# Patient Record
Sex: Male | Born: 1945 | Race: White | Hispanic: No | Smoking: Former smoker
Health system: Southern US, Community
[De-identification: ages and names within clinical notes are randomized; demographics above are authoritative.]

## PROBLEM LIST (undated history)

## (undated) DIAGNOSIS — Z950 Presence of cardiac pacemaker: Secondary | ICD-10-CM

## (undated) DIAGNOSIS — Z87442 Personal history of urinary calculi: Secondary | ICD-10-CM

## (undated) DIAGNOSIS — R112 Nausea with vomiting, unspecified: Secondary | ICD-10-CM

## (undated) DIAGNOSIS — J329 Chronic sinusitis, unspecified: Secondary | ICD-10-CM

## (undated) DIAGNOSIS — R351 Nocturia: Secondary | ICD-10-CM

## (undated) DIAGNOSIS — R42 Dizziness and giddiness: Secondary | ICD-10-CM

## (undated) DIAGNOSIS — K449 Diaphragmatic hernia without obstruction or gangrene: Secondary | ICD-10-CM

## (undated) DIAGNOSIS — I5022 Chronic systolic (congestive) heart failure: Secondary | ICD-10-CM

## (undated) DIAGNOSIS — S06369A Traumatic hemorrhage of cerebrum, unspecified, with loss of consciousness of unspecified duration, initial encounter: Secondary | ICD-10-CM

## (undated) DIAGNOSIS — J449 Chronic obstructive pulmonary disease, unspecified: Secondary | ICD-10-CM

## (undated) DIAGNOSIS — E785 Hyperlipidemia, unspecified: Secondary | ICD-10-CM

## (undated) DIAGNOSIS — I77810 Thoracic aortic ectasia: Secondary | ICD-10-CM

## (undated) DIAGNOSIS — K219 Gastro-esophageal reflux disease without esophagitis: Secondary | ICD-10-CM

## (undated) DIAGNOSIS — I429 Cardiomyopathy, unspecified: Secondary | ICD-10-CM

## (undated) DIAGNOSIS — J324 Chronic pansinusitis: Secondary | ICD-10-CM

## (undated) DIAGNOSIS — I10 Essential (primary) hypertension: Secondary | ICD-10-CM

## (undated) DIAGNOSIS — G8929 Other chronic pain: Secondary | ICD-10-CM

## (undated) DIAGNOSIS — E669 Obesity, unspecified: Secondary | ICD-10-CM

## (undated) DIAGNOSIS — M1990 Unspecified osteoarthritis, unspecified site: Secondary | ICD-10-CM

## (undated) DIAGNOSIS — N529 Male erectile dysfunction, unspecified: Secondary | ICD-10-CM

## (undated) DIAGNOSIS — N183 Chronic kidney disease, stage 3 unspecified: Secondary | ICD-10-CM

## (undated) DIAGNOSIS — M2550 Pain in unspecified joint: Secondary | ICD-10-CM

## (undated) DIAGNOSIS — I499 Cardiac arrhythmia, unspecified: Secondary | ICD-10-CM

## (undated) DIAGNOSIS — I2510 Atherosclerotic heart disease of native coronary artery without angina pectoris: Secondary | ICD-10-CM

## (undated) DIAGNOSIS — N400 Enlarged prostate without lower urinary tract symptoms: Secondary | ICD-10-CM

## (undated) DIAGNOSIS — N481 Balanitis: Secondary | ICD-10-CM

## (undated) DIAGNOSIS — R0600 Dyspnea, unspecified: Secondary | ICD-10-CM

## (undated) DIAGNOSIS — Z9889 Other specified postprocedural states: Secondary | ICD-10-CM

## (undated) HISTORY — DX: Cardiac arrhythmia, unspecified: I49.9

## (undated) HISTORY — DX: Hyperlipidemia, unspecified: E78.5

## (undated) HISTORY — DX: Other chronic pain: G89.29

## (undated) HISTORY — DX: Essential (primary) hypertension: I10

## (undated) HISTORY — DX: Unspecified osteoarthritis, unspecified site: M19.90

## (undated) HISTORY — DX: Diaphragmatic hernia without obstruction or gangrene: K44.9

## (undated) HISTORY — DX: Gastro-esophageal reflux disease without esophagitis: K21.9

## (undated) HISTORY — DX: Pain in unspecified joint: M25.50

## (undated) HISTORY — DX: Male erectile dysfunction, unspecified: N52.9

## (undated) HISTORY — DX: Traumatic hemorrhage of cerebrum, unspecified, with loss of consciousness of unspecified duration, initial encounter: S06.369A

## (undated) HISTORY — PX: SINUS SURGERY WITH INSTATRAK: SHX5215

## (undated) HISTORY — DX: Chronic sinusitis, unspecified: J32.9

---

## 1993-05-04 DIAGNOSIS — S0689AA Other specified intracranial injury with loss of consciousness status unknown, initial encounter: Secondary | ICD-10-CM

## 1993-05-04 DIAGNOSIS — S06899A Other specified intracranial injury with loss of consciousness of unspecified duration, initial encounter: Secondary | ICD-10-CM

## 1993-05-04 HISTORY — DX: Other specified intracranial injury with loss of consciousness status unknown, initial encounter: S06.89AA

## 1993-05-04 HISTORY — PX: OTHER SURGICAL HISTORY: SHX169

## 1993-05-04 HISTORY — PX: CRANIOTOMY: SHX93

## 1993-05-04 HISTORY — DX: Other specified intracranial injury with loss of consciousness of unspecified duration, initial encounter: S06.899A

## 1997-11-22 ENCOUNTER — Encounter: Admission: RE | Admit: 1997-11-22 | Discharge: 1997-11-22 | Payer: Self-pay

## 1997-12-31 ENCOUNTER — Encounter: Admission: RE | Admit: 1997-12-31 | Discharge: 1997-12-31 | Payer: Self-pay | Admitting: Hematology and Oncology

## 1998-11-07 ENCOUNTER — Encounter: Admission: RE | Admit: 1998-11-07 | Discharge: 1998-11-07 | Payer: Self-pay | Admitting: Hematology and Oncology

## 1999-04-08 ENCOUNTER — Encounter: Admission: RE | Admit: 1999-04-08 | Discharge: 1999-04-08 | Payer: Self-pay | Admitting: Internal Medicine

## 1999-05-22 ENCOUNTER — Encounter: Admission: RE | Admit: 1999-05-22 | Discharge: 1999-05-22 | Payer: Self-pay | Admitting: Internal Medicine

## 1999-09-27 ENCOUNTER — Encounter: Payer: Self-pay | Admitting: Emergency Medicine

## 1999-09-27 ENCOUNTER — Emergency Department (HOSPITAL_COMMUNITY): Admission: EM | Admit: 1999-09-27 | Discharge: 1999-09-27 | Payer: Self-pay | Admitting: Emergency Medicine

## 1999-10-16 ENCOUNTER — Encounter: Admission: RE | Admit: 1999-10-16 | Discharge: 1999-10-16 | Payer: Self-pay | Admitting: Internal Medicine

## 2000-09-30 ENCOUNTER — Encounter: Admission: RE | Admit: 2000-09-30 | Discharge: 2000-09-30 | Payer: Self-pay | Admitting: Internal Medicine

## 2001-06-15 ENCOUNTER — Encounter: Admission: RE | Admit: 2001-06-15 | Discharge: 2001-06-15 | Payer: Self-pay

## 2002-04-10 ENCOUNTER — Encounter: Admission: RE | Admit: 2002-04-10 | Discharge: 2002-04-10 | Payer: Self-pay | Admitting: Internal Medicine

## 2002-07-14 ENCOUNTER — Encounter: Admission: RE | Admit: 2002-07-14 | Discharge: 2002-07-14 | Payer: Self-pay | Admitting: Internal Medicine

## 2002-07-24 ENCOUNTER — Encounter: Admission: RE | Admit: 2002-07-24 | Discharge: 2002-07-24 | Payer: Self-pay | Admitting: Internal Medicine

## 2002-07-28 ENCOUNTER — Encounter: Payer: Self-pay | Admitting: Internal Medicine

## 2002-07-28 ENCOUNTER — Ambulatory Visit (HOSPITAL_COMMUNITY): Admission: RE | Admit: 2002-07-28 | Discharge: 2002-07-28 | Payer: Self-pay | Admitting: Internal Medicine

## 2002-07-31 ENCOUNTER — Encounter: Admission: RE | Admit: 2002-07-31 | Discharge: 2002-07-31 | Payer: Self-pay | Admitting: Internal Medicine

## 2002-09-01 ENCOUNTER — Encounter: Payer: Self-pay | Admitting: Otolaryngology

## 2002-09-01 ENCOUNTER — Ambulatory Visit (HOSPITAL_COMMUNITY): Admission: RE | Admit: 2002-09-01 | Discharge: 2002-09-01 | Payer: Self-pay | Admitting: Otolaryngology

## 2002-09-08 ENCOUNTER — Encounter (INDEPENDENT_AMBULATORY_CARE_PROVIDER_SITE_OTHER): Payer: Self-pay | Admitting: Specialist

## 2002-09-08 ENCOUNTER — Ambulatory Visit (HOSPITAL_BASED_OUTPATIENT_CLINIC_OR_DEPARTMENT_OTHER): Admission: RE | Admit: 2002-09-08 | Discharge: 2002-09-08 | Payer: Self-pay | Admitting: Otolaryngology

## 2002-11-09 ENCOUNTER — Encounter: Admission: RE | Admit: 2002-11-09 | Discharge: 2002-11-09 | Payer: Self-pay | Admitting: Internal Medicine

## 2002-11-16 ENCOUNTER — Encounter: Admission: RE | Admit: 2002-11-16 | Discharge: 2002-11-16 | Payer: Self-pay | Admitting: Internal Medicine

## 2002-11-29 ENCOUNTER — Encounter: Admission: RE | Admit: 2002-11-29 | Discharge: 2002-11-29 | Payer: Self-pay | Admitting: Internal Medicine

## 2002-12-19 ENCOUNTER — Encounter: Admission: RE | Admit: 2002-12-19 | Discharge: 2002-12-19 | Payer: Self-pay | Admitting: Internal Medicine

## 2003-01-01 ENCOUNTER — Encounter: Admission: RE | Admit: 2003-01-01 | Discharge: 2003-01-01 | Payer: Self-pay | Admitting: Internal Medicine

## 2003-02-19 ENCOUNTER — Encounter: Admission: RE | Admit: 2003-02-19 | Discharge: 2003-02-19 | Payer: Self-pay | Admitting: Internal Medicine

## 2003-06-25 ENCOUNTER — Encounter: Admission: RE | Admit: 2003-06-25 | Discharge: 2003-06-25 | Payer: Self-pay | Admitting: Internal Medicine

## 2003-12-04 ENCOUNTER — Encounter: Admission: RE | Admit: 2003-12-04 | Discharge: 2003-12-04 | Payer: Self-pay | Admitting: Internal Medicine

## 2003-12-17 ENCOUNTER — Encounter: Admission: RE | Admit: 2003-12-17 | Discharge: 2003-12-17 | Payer: Self-pay | Admitting: Internal Medicine

## 2003-12-31 ENCOUNTER — Encounter: Admission: RE | Admit: 2003-12-31 | Discharge: 2003-12-31 | Payer: Self-pay | Admitting: Internal Medicine

## 2004-05-16 ENCOUNTER — Emergency Department (HOSPITAL_COMMUNITY): Admission: EM | Admit: 2004-05-16 | Discharge: 2004-05-16 | Payer: Self-pay | Admitting: Family Medicine

## 2004-08-13 ENCOUNTER — Ambulatory Visit: Payer: Self-pay | Admitting: Internal Medicine

## 2004-08-27 ENCOUNTER — Ambulatory Visit: Payer: Self-pay | Admitting: Internal Medicine

## 2005-05-14 ENCOUNTER — Ambulatory Visit: Payer: Self-pay | Admitting: Internal Medicine

## 2005-05-15 ENCOUNTER — Ambulatory Visit: Payer: Self-pay | Admitting: Hospitalist

## 2005-05-22 ENCOUNTER — Ambulatory Visit: Payer: Self-pay | Admitting: Internal Medicine

## 2005-05-22 ENCOUNTER — Encounter (INDEPENDENT_AMBULATORY_CARE_PROVIDER_SITE_OTHER): Payer: Self-pay | Admitting: *Deleted

## 2005-05-22 LAB — CONVERTED CEMR LAB
Cholesterol: 203 mg/dL
Creatinine, Ser: 1 mg/dL
Glucose, Bld: 99 mg/dL
HDL: 45 mg/dL
LDL Cholesterol: 142 mg/dL
Triglyceride fasting, serum: 80 mg/dL

## 2005-06-10 ENCOUNTER — Ambulatory Visit: Payer: Self-pay | Admitting: Gastroenterology

## 2005-06-17 ENCOUNTER — Ambulatory Visit: Payer: Self-pay | Admitting: Gastroenterology

## 2005-06-17 ENCOUNTER — Encounter (INDEPENDENT_AMBULATORY_CARE_PROVIDER_SITE_OTHER): Payer: Self-pay | Admitting: *Deleted

## 2005-06-17 LAB — HM COLONOSCOPY: HM Colonoscopy: ABNORMAL

## 2006-02-12 ENCOUNTER — Ambulatory Visit: Payer: Self-pay | Admitting: Internal Medicine

## 2006-03-15 ENCOUNTER — Encounter: Payer: Self-pay | Admitting: *Deleted

## 2006-03-15 DIAGNOSIS — K449 Diaphragmatic hernia without obstruction or gangrene: Secondary | ICD-10-CM | POA: Insufficient documentation

## 2006-03-15 DIAGNOSIS — S069X9A Unspecified intracranial injury with loss of consciousness of unspecified duration, initial encounter: Secondary | ICD-10-CM

## 2006-03-15 DIAGNOSIS — E785 Hyperlipidemia, unspecified: Secondary | ICD-10-CM

## 2006-03-15 DIAGNOSIS — M1990 Unspecified osteoarthritis, unspecified site: Secondary | ICD-10-CM | POA: Insufficient documentation

## 2006-03-15 DIAGNOSIS — I499 Cardiac arrhythmia, unspecified: Secondary | ICD-10-CM | POA: Insufficient documentation

## 2006-03-15 DIAGNOSIS — I10 Essential (primary) hypertension: Secondary | ICD-10-CM | POA: Insufficient documentation

## 2006-03-15 DIAGNOSIS — K219 Gastro-esophageal reflux disease without esophagitis: Secondary | ICD-10-CM | POA: Insufficient documentation

## 2006-03-15 DIAGNOSIS — J329 Chronic sinusitis, unspecified: Secondary | ICD-10-CM | POA: Insufficient documentation

## 2006-04-16 ENCOUNTER — Ambulatory Visit: Payer: Self-pay | Admitting: Internal Medicine

## 2006-04-22 DIAGNOSIS — N4889 Other specified disorders of penis: Secondary | ICD-10-CM | POA: Insufficient documentation

## 2006-04-22 DIAGNOSIS — M109 Gout, unspecified: Secondary | ICD-10-CM

## 2006-04-22 DIAGNOSIS — F528 Other sexual dysfunction not due to a substance or known physiological condition: Secondary | ICD-10-CM

## 2006-05-22 ENCOUNTER — Emergency Department (HOSPITAL_COMMUNITY): Admission: EM | Admit: 2006-05-22 | Discharge: 2006-05-22 | Payer: Self-pay | Admitting: Emergency Medicine

## 2006-07-14 ENCOUNTER — Encounter (INDEPENDENT_AMBULATORY_CARE_PROVIDER_SITE_OTHER): Payer: Self-pay | Admitting: *Deleted

## 2006-07-14 ENCOUNTER — Ambulatory Visit: Payer: Self-pay | Admitting: *Deleted

## 2006-07-14 LAB — CONVERTED CEMR LAB
ALT: 27 units/L (ref 0–53)
AST: 20 units/L (ref 0–37)
Albumin: 4.5 g/dL (ref 3.5–5.2)
Alkaline Phosphatase: 56 units/L (ref 39–117)
BUN: 21 mg/dL (ref 6–23)
CO2: 26 meq/L (ref 19–32)
Calcium: 9.4 mg/dL (ref 8.4–10.5)
Chloride: 106 meq/L (ref 96–112)
Cholesterol: 152 mg/dL (ref 0–200)
Creatinine, Ser: 0.85 mg/dL (ref 0.40–1.50)
Glucose, Bld: 97 mg/dL (ref 70–99)
HDL: 47 mg/dL (ref >39)
LDL Cholesterol: 92 mg/dL (ref 0–99)
Phenobarbital: 19.6 ug/mL (ref 15.0–40.0)
Potassium: 4.6 meq/L (ref 3.5–5.3)
Sodium: 141 meq/L (ref 135–145)
Total Bilirubin: 0.5 mg/dL (ref 0.3–1.2)
Total CHOL/HDL Ratio: 3.2
Total Protein: 7.5 g/dL (ref 6.0–8.3)
Triglycerides: 64 mg/dL (ref <150)
VLDL: 13 mg/dL (ref 0–40)

## 2007-01-07 ENCOUNTER — Telehealth: Payer: Self-pay | Admitting: *Deleted

## 2007-02-28 ENCOUNTER — Telehealth: Payer: Self-pay | Admitting: *Deleted

## 2007-03-14 ENCOUNTER — Telehealth: Payer: Self-pay | Admitting: *Deleted

## 2007-03-28 ENCOUNTER — Ambulatory Visit: Payer: Self-pay | Admitting: Internal Medicine

## 2007-03-28 DIAGNOSIS — R21 Rash and other nonspecific skin eruption: Secondary | ICD-10-CM | POA: Insufficient documentation

## 2007-06-19 ENCOUNTER — Emergency Department (HOSPITAL_COMMUNITY): Admission: EM | Admit: 2007-06-19 | Discharge: 2007-06-19 | Payer: Self-pay | Admitting: Family Medicine

## 2007-09-29 ENCOUNTER — Telehealth (INDEPENDENT_AMBULATORY_CARE_PROVIDER_SITE_OTHER): Payer: Self-pay | Admitting: *Deleted

## 2007-10-10 ENCOUNTER — Telehealth (INDEPENDENT_AMBULATORY_CARE_PROVIDER_SITE_OTHER): Payer: Self-pay | Admitting: *Deleted

## 2008-01-10 ENCOUNTER — Ambulatory Visit: Payer: Self-pay | Admitting: Internal Medicine

## 2008-01-10 ENCOUNTER — Encounter (INDEPENDENT_AMBULATORY_CARE_PROVIDER_SITE_OTHER): Payer: Self-pay | Admitting: *Deleted

## 2008-01-10 DIAGNOSIS — N400 Enlarged prostate without lower urinary tract symptoms: Secondary | ICD-10-CM | POA: Insufficient documentation

## 2008-01-10 LAB — CONVERTED CEMR LAB
CO2: 28 meq/L (ref 19–32)
Calcium: 9.3 mg/dL (ref 8.4–10.5)
Chloride: 103 meq/L (ref 96–112)
HDL: 45 mg/dL (ref >39)
Hemoglobin, Urine: NEGATIVE
Ketones, ur: NEGATIVE mg/dL
LDL Cholesterol: 82 mg/dL (ref 0–99)
Nitrite: NEGATIVE
PSA: 0.54 ng/mL (ref 0.10–4.00)
Platelets: 129 10*3/uL — ABNORMAL LOW (ref 150–400)
Potassium: 4.6 meq/L (ref 3.5–5.3)
Sodium: 140 meq/L (ref 135–145)
Total CHOL/HDL Ratio: 3.2
WBC: 5.3 10*3/uL (ref 4.0–10.5)
pH: 7 (ref 5.0–8.0)

## 2008-02-06 ENCOUNTER — Ambulatory Visit: Payer: Self-pay | Admitting: *Deleted

## 2008-02-06 ENCOUNTER — Observation Stay (HOSPITAL_COMMUNITY): Admission: EM | Admit: 2008-02-06 | Discharge: 2008-02-07 | Payer: Self-pay | Admitting: Emergency Medicine

## 2008-02-07 ENCOUNTER — Encounter: Payer: Self-pay | Admitting: Internal Medicine

## 2008-07-25 ENCOUNTER — Telehealth (INDEPENDENT_AMBULATORY_CARE_PROVIDER_SITE_OTHER): Payer: Self-pay | Admitting: *Deleted

## 2008-08-21 ENCOUNTER — Encounter (INDEPENDENT_AMBULATORY_CARE_PROVIDER_SITE_OTHER): Payer: Self-pay | Admitting: Internal Medicine

## 2008-08-24 ENCOUNTER — Telehealth (INDEPENDENT_AMBULATORY_CARE_PROVIDER_SITE_OTHER): Payer: Self-pay | Admitting: Internal Medicine

## 2008-10-08 ENCOUNTER — Telehealth: Payer: Self-pay | Admitting: *Deleted

## 2008-11-19 ENCOUNTER — Telehealth: Payer: Self-pay | Admitting: Internal Medicine

## 2009-01-03 ENCOUNTER — Ambulatory Visit: Payer: Self-pay | Admitting: Internal Medicine

## 2009-01-03 LAB — CONVERTED CEMR LAB
AST: 19 units/L (ref 0–37)
BUN: 12 mg/dL (ref 6–23)
CO2: 26 meq/L (ref 19–32)
Calcium: 9.7 mg/dL (ref 8.4–10.5)
Chloride: 103 meq/L (ref 96–112)
Creatinine, Ser: 0.96 mg/dL (ref 0.40–1.50)

## 2009-06-17 ENCOUNTER — Telehealth: Payer: Self-pay | Admitting: Internal Medicine

## 2009-07-15 ENCOUNTER — Telehealth: Payer: Self-pay | Admitting: Internal Medicine

## 2009-07-17 ENCOUNTER — Telehealth: Payer: Self-pay | Admitting: *Deleted

## 2009-08-19 ENCOUNTER — Telehealth: Payer: Self-pay | Admitting: Internal Medicine

## 2009-09-20 ENCOUNTER — Telehealth: Payer: Self-pay | Admitting: Internal Medicine

## 2010-01-21 ENCOUNTER — Telehealth: Payer: Self-pay | Admitting: Internal Medicine

## 2010-01-23 ENCOUNTER — Telehealth: Payer: Self-pay | Admitting: Internal Medicine

## 2010-02-20 ENCOUNTER — Telehealth: Payer: Self-pay | Admitting: Internal Medicine

## 2010-03-10 ENCOUNTER — Ambulatory Visit: Payer: Self-pay | Admitting: Internal Medicine

## 2010-03-17 ENCOUNTER — Ambulatory Visit: Payer: Self-pay | Admitting: Internal Medicine

## 2010-03-17 LAB — CONVERTED CEMR LAB
ALT: 34 units/L (ref 0–53)
Albumin: 4.4 g/dL (ref 3.5–5.2)
CO2: 32 meq/L (ref 19–32)
Calcium: 9.3 mg/dL (ref 8.4–10.5)
Chloride: 104 meq/L (ref 96–112)
Cholesterol: 146 mg/dL (ref 0–200)
Potassium: 4.7 meq/L (ref 3.5–5.3)
Sodium: 145 meq/L (ref 135–145)
Total Protein: 7.3 g/dL (ref 6.0–8.3)

## 2010-03-21 ENCOUNTER — Telehealth: Payer: Self-pay | Admitting: Internal Medicine

## 2010-06-05 NOTE — Progress Notes (Signed)
Summary: med change/gp  Phone Note Refill Request Message from:  Fax from Pharmacy on June 17, 2009 10:49 AM  Refills Requested: Medication #1:  COLCHICINE 0.6 MG TABS Take 1 tablet by mouth two times a day This med. is no longer available; can  you substitute for something else?   Method Requested: Electronic Initial call taken by: Morrison Old RN,  June 17, 2009 10:50 AM  Follow-up for Phone Call        Rx written for colcrys to replace colchicine. Follow-up by: Geanie Kenning MD,  June 17, 2009 9:20 PM    New/Updated Medications: COLCRYS 0.6 MG TABS (COLCHICINE) Take 1 tablet by mouth two times a day Prescriptions: COLCRYS 0.6 MG TABS (COLCHICINE) Take 1 tablet by mouth two times a day  #62 x 6   Entered and Authorized by:   Geanie Kenning MD   Signed by:   Geanie Kenning MD on 06/17/2009   Method used:   Electronically to        [facility] (retail)       [address]       [place], [place]  [address]       Ph: [phone]       Fax: [phone]   RxID:   [id number]

## 2010-06-05 NOTE — Progress Notes (Signed)
Summary: refill/gg  Phone Note Refill Request  on July 15, 2009 2:28 PM  Refills Requested: Medication #1:  PHENOBARBITAL 64.8 MG TABS Take 1 tablet by mouth two times a day   Dosage confirmed as above?Dosage Confirmed  Method Requested: Electronic Initial call taken by: Gevena Cotton RN,  July 15, 2009 2:28 PM    Prescriptions: PHENOBARBITAL 64.8 MG TABS (PHENOBARBITAL) Take 1 tablet by mouth two times a day  #120 x 3   Entered and Authorized by:   Geanie Kenning MD   Signed by:   Geanie Kenning MD on 07/16/2009   Method used:   Telephoned to ...       [facility] ([facility]ail)       [address]       [place], [place]  [address]       Ph: [phone]       Fax: [phone]   RxID:   [id number]   Appended Document: refill/gg Rx called in

## 2010-06-05 NOTE — Progress Notes (Signed)
Summary: Refill/gh  Phone Note Refill Request Message from:  Fax from Pharmacy on March 21, 2010 5:03 PM  Refills Requested: Medication #1:  HYDRALAZINE HCL 25 MG  TABS Take 2 tabs by mouth two times a day   Dosage confirmed as above?Dosage Confirmed   Last Refilled: 02/19/2010  Medication #2:  PRAVASTATIN SODIUM 40 MG TABS Take 1 tablet by mouth once a day   Dosage confirmed as above?Dosage Confirmed   Last Refilled: 02/19/2010  Method Requested: Electronic Initial call taken by: Sander Nephew RN,  March 21, 2010 5:04 PM  Follow-up for Phone Call        Rx completed in Dr. Brantley Stage Follow-up by: Geanie Kenning MD,  March 21, 2010 8:55 PM    Prescriptions: PRAVASTATIN SODIUM 40 MG TABS (PRAVASTATIN SODIUM) Take 1 tablet by mouth once a day  #30 x 6   Entered and Authorized by:   Geanie Kenning MD   Signed by:   Geanie Kenning MD on 03/21/2010   Method used:   Electronically to        [facility] (retail)       [address]       [place], [place]  [address]       Ph: [phone]       Fax: [phone]   RxID:   [id number] HYDRALAZINE HCL 25 MG  TABS (HYDRALAZINE HCL) Take 2 tabs by mouth two times a day  #120 x 6   Entered and Authorized by:   Geanie Kenning MD   Signed by:   Geanie Kenning MD on 03/21/2010   Method used:   Electronically to        [facility] (retail)       [address]       [place], [place]  [address]       Ph: [phone]       Fax: [phone]   RxID:   [id number]

## 2010-06-05 NOTE — Progress Notes (Signed)
Summary: check refill/ hla  Phone Note Call from Patient   Summary of Call: pt called to check if med sent to pharm, yes, goodbye Initial call taken by: Freddy Finner RN,  July 17, 2009 2:34 PM

## 2010-06-05 NOTE — Progress Notes (Signed)
Summary: med refill/gp  Phone Note Refill Request Message from:  Fax from Pharmacy on August 19, 2009 12:27 PM  Refills Requested: Medication #1:  HYDRALAZINE HCL 25 MG  TABS Take 2 tabs by mouth two times a day   Dosage confirmed as above?Dosage Confirmed   Last Refilled: 07/14/2009  Medication #2:  PRAVASTATIN SODIUM 40 MG TABS Take 1 tablet by mouth once a day   Dosage confirmed as above?Dosage Confirmed   Last Refilled: 07/14/2009  Method Requested: Electronic Initial call taken by: Morrison Old RN,  August 19, 2009 12:27 PM    Prescriptions: PRAVASTATIN SODIUM 40 MG TABS (PRAVASTATIN SODIUM) Take 1 tablet by mouth once a day  #30 x 6   Entered and Authorized by:   Geanie Kenning MD   Signed by:   Geanie Kenning MD on 08/19/2009   Method used:   Electronically to        [facility] (retail)       [address]       [place], [place]  [address]       Ph: [phone]       Fax: [phone]   RxID:   [id number] HYDRALAZINE HCL 25 MG  TABS (HYDRALAZINE HCL) Take 2 tabs by mouth two times a day  #120 x 6   Entered and Authorized by:   Geanie Kenning MD   Signed by:   Geanie Kenning MD on 08/19/2009   Method used:   Electronically to        [facility] (retail)       [address]       [place],   [address]       Ph: [phone]       Fax: [phone]   RxID:   [id number]

## 2010-06-05 NOTE — Progress Notes (Signed)
Summary: Refill/gh  Phone Note Refill Request Message from:  Fax from Pharmacy on February 20, 2010 11:46 AM  Refills Requested: Medication #1:  ENALAPRIL MALEATE 20 MG TABS Take 2 tablets by mouth once daily   Dosage confirmed as above?Dosage Confirmed   Last Refilled: 12/23/2009  Method Requested: Electronic Initial call taken by: Sander Nephew RN,  February 20, 2010 11:46 AM  Follow-up for Phone Call        Rx completed in Dr. Brantley Stage Follow-up by: Geanie Kenning MD,  February 20, 2010 7:04 PM    Prescriptions: ENALAPRIL MALEATE 20 MG TABS (ENALAPRIL MALEATE) Take 2 tablets by mouth once daily  #60 x 11   Entered and Authorized by:   Geanie Kenning MD   Signed by:   Geanie Kenning MD on 02/20/2010   Method used:   Electronically to        [facility] (retail)       [address]       [place],   [address]       Ph: [phone]       Fax: [phone]   RxID:   [id number]

## 2010-06-05 NOTE — Progress Notes (Signed)
Summary: refill/gg  **  Phone Note Refill Request  on January 23, 2010 1:04 PM  Refills Requested: Medication #1:  PHENOBARBITAL 64.8 MG TABS Take 1 tablet by mouth two times a day   Dosage confirmed as above?Dosage Confirmed Pt is out of med   Method Requested: Electronic Initial call taken by: Gevena Cotton RN,  January 23, 2010 1:04 PM  Follow-up for Phone Call        Rx completed in Dr. Brantley Stage Follow-up by: Geanie Kenning MD,  January 23, 2010 2:20 PM    Prescriptions: PHENOBARBITAL 64.8 MG TABS (PHENOBARBITAL) Take 1 tablet by mouth two times a day  #120 x 3   Entered by:   Geanie Kenning MD   Authorized by:   [facility]   Signed by:   Geanie Kenning MD on 01/23/2010   Method used:   Telephoned to ...       [facility] ([facility]       [address]       [place], [place]  [address]       Ph: [phone]       Fax: [phone]   RxID:   [id number]

## 2010-06-05 NOTE — Progress Notes (Signed)
Summary: med refill/gp  Phone Note Refill Request Message from:  Fax from Pharmacy on Sep 20, 2009 11:39 AM  Refills Requested: Medication #1:  PRILOSEC 20 MG CPDR Take 1 capsule by mouth once a day   Dosage confirmed as above?Dosage Confirmed   Last Refilled: 08/17/2009  Medication #2:  HYDROCHLOROTHIAZIDE 25 MG TABS Take 1 tablet by mouth once a day   Dosage confirmed as above?Dosage Confirmed   Last Refilled: 08/17/2009  Medication #3:  CLONIDINE HCL 0.3 MG TABS Take 1 tablet by mouth two times a day   Dosage confirmed as above?Dosage Confirmed   Last Refilled: 08/17/2009  Method Requested: Electronic Initial call taken by: Morrison Old RN,  Sep 20, 2009 11:39 AM  Follow-up for Phone Call        Rx completed in Dr. Brantley Stage Follow-up by: Geanie Kenning MD,  Sep 21, 2009 12:11 AM    Prescriptions: PRILOSEC 20 MG CPDR (OMEPRAZOLE) Take 1 capsule by mouth once a day  #30 x 6   Entered and Authorized by:   Geanie Kenning MD   Signed by:   Geanie Kenning MD on 09/21/2009   Method used:   Electronically to        [facility] (retail)       [address]       [place], [place]  [address]       Ph: [phone]       Fax: [phone]   RxID:   [id number] HYDROCHLOROTHIAZIDE 25 MG TABS (HYDROCHLOROTHIAZIDE) Take 1 tablet by mouth once a day  #30 x 6   Entered and Authorized by:   Geanie Kenning MD   Signed by:   Geanie Kenning MD on 09/21/2009   Method used:   Electronically to        [facility] (retail)       [address]       [place], [place]  [address]       Ph: [phone]       Fax: [phone]   RxID:   [id number] CLONIDINE HCL 0.3 MG TABS (CLONIDINE HCL) Take 1 tablet by mouth two times a day  #60 x 6   Entered and Authorized by:   Geanie Kenning MD   Signed by:   Geanie Kenning MD on 09/21/2009   Method used:   Electronically to        [facility] (retail)       [address]       [place], [place]  [address]       Ph: [phone]       Fax: [phone]   RxID:   [id number]

## 2010-06-05 NOTE — Progress Notes (Signed)
Summary: med. refill/gp  Phone Note Refill Request Message from:  Fax from Pharmacy on January 21, 2010 3:52 PM  Refills Requested: Medication #1:  MELOXICAM 7.5 MG TABS Take 1 tablet by mouth once a day   Dosage confirmed as above?Dosage Confirmed   Last Refilled: 12/20/2009  Medication #2:  COLCRYS 0.6 MG TABS Take 1 tablet by mouth two times a day.   Dosage confirmed as above?Dosage Confirmed   Last Refilled: 12/26/2009 Last appt. 01/03/09.  An appt.scheduled Nov 3 w/Dr Ronny Flurry.   Method Requested: Electronic Initial call taken by: Morrison Old RN,  January 21, 2010 3:51 PM  Follow-up for Phone Call        Rx completed in Dr. Brantley Stage Follow-up by: Geanie Kenning MD,  January 22, 2010 3:52 PM    Prescriptions: COLCRYS 0.6 MG TABS (COLCHICINE) Take 1 tablet by mouth two times a day  #62 x 6   Entered and Authorized by:   Geanie Kenning MD   Signed by:   Geanie Kenning MD on 01/22/2010   Method used:   Electronically to        [facility] (retail)       [address]       [place], [place]  [address]       Ph: [phone]       Fax: [phone]   RxID:   [id number] MELOXICAM 7.5 MG TABS (MELOXICAM) Take 1 tablet by mouth once a day  #30 x 6   Entered and Authorized by:   Geanie Kenning MD   Signed by:   Geanie Kenning MD on 01/22/2010   Method used:   Electronically to        [facility] (retail)       [address]       [place], [place]  [address]       Ph: [phone]       Fax: [phone]   RxID:   [id number]

## 2010-06-05 NOTE — Assessment & Plan Note (Signed)
Summary: est-ck/fu/meds/cfb   Vital Signs:  Patient profile:   65 year old male Height:      66 inches (167.64 cm) Weight:      212.8 pounds (96.73 kg) BMI:     34.47 Temp:     98.2 degrees F Pulse rate:   77 / minute BP sitting:   139 / 87  (left arm) Cuff size:   large  Vitals Entered By: Yvonna Alanis RN (March 10, 2010 4:31 PM) CC: check up - needs new Rx - all - wants flu shot Is Patient Diabetic? No Pain Assessment Patient in pain? yes     Location: all joints Intensity: 3 Type: aching Onset of pain  chronic - started after surgery in 1995 Nutritional Status BMI of > 30 = obese  Does patient need assistance? Functional Status Self care Ambulation Normal   Primary Care Provider:  Geanie Kenning MD  CC:  check up - needs new Rx - all - wants flu shot.  History of Present Illness: Pt is a 65 yo man with PMH of HTN, , OA and HLD and gout who came here for check his BP and refills.  He has been taking all his meds as instructed. No c/o including CP, SOB, muscle pain, fever, HA or seizure. No melena, hematochezia, dysuria. He still has multiple joint pain including his knees, fingers, and ankles, and mobic and tylenol helped some, but still not well controlled, worse in the morning, better after about 30 minutes of movement. He needs to refill all his meds. Denies smoking, ETOH or drug abuse.    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 Take 1 Tablet By Mouth Once A Day 2)  Clonidine Hcl 0.3 Mg Tabs (Clonidine Hcl) .... Take 1 Tablet By Mouth Two Times A Day 3)  Prilosec 20 Mg Cpdr (Omeprazole) .... Take 1 Capsule By Mouth Once A Day 4)  Phenobarbital 64.8 Mg Tabs (Phenobarbital) .... Take 1 Tablet By Mouth Two Times A Day 5)  Enalapril Maleate 20 Mg Tabs (Enalapril Maleate) .... Take 2 Tablets By Mouth Once Daily 6)  Hydralazine Hcl 25 Mg  Tabs (Hydralazine Hcl) .... Take 2 Tabs By Mouth Two Times A Day 7)  Tylenol Arthritis Pain  Tbcr (Acetaminophen Tbcr) .... Dose/frequency Not in Chart 8)  Meloxicam 7.5 Mg Tabs (Meloxicam) .... Take 1 Tablet By Mouth Once A Day 9)  Viagra 100 Mg Tabs (Sildenafil Citrate) .... Take 1/2 Tablet By Mouth As Needed. 10)  Pravastatin Sodium 40 Mg Tabs (Pravastatin Sodium) .... Take 1 Tablet By Mouth Once A Day 11)  Mucinex Dm 30-600 Mg Xr12h-Tab (Dextromethorphan-Guaifenesin) .... Take 1 Tablet By Mouth Once A Day As Needed For Cough and Congestion 12)  Colcrys 0.6 Mg Tabs (Colchicine) .Marland KitchenMarland KitchenMarland Kitchen  Take 1 Tablet By Mouth Two Times A Day  Current Medications (verified): 1)  Hydrochlorothiazide 25 Mg Tabs (Hydrochlorothiazide) .... Take 1 Tablet By Mouth Once A Day 2)  Clonidine Hcl 0.3 Mg Tabs (Clonidine Hcl) .... Take 1 Tablet By Mouth Two Times A Day 3)  Prilosec 20 Mg Cpdr (Omeprazole) .... Take 1 Capsule By Mouth Once A Day 4)  Phenobarbital 64.8 Mg Tabs (Phenobarbital) .... Take 1 Tablet By Mouth Two Times A Day 5)  Enalapril Maleate 20 Mg Tabs (Enalapril Maleate) .... Take 2 Tablets By Mouth Once Daily 6)  Hydralazine Hcl 25 Mg  Tabs (Hydralazine Hcl) .... Take 2 Tabs By Mouth Two Times A Day 7)  Tylenol Arthritis Pain  Tbcr (Acetaminophen Tbcr) .... Dose/frequency Not in Chart 8)  Pravastatin Sodium 40 Mg Tabs  (Pravastatin Sodium) .... Take 1 Tablet By Mouth Once A Day 9)  Mucinex Dm 30-600 Mg Xr12h-Tab (Dextromethorphan-Guaifenesin) .... Take 1 Tablet By Mouth Once A Day As Needed For Cough and Congestion 10)  Colcrys 0.6 Mg Tabs (Colchicine) .... Take 1 Tablet By Mouth Two Times A Day 11)  Tramadol Hcl 50 Mg Tabs (Tramadol Hcl) .... Take 1 Tablet By Mouth Four Times A Day As Needed For Pain  Allergies (verified): 1)  ! * Channel Blockers  Past History:  Past Medical History: Last updated: 03/15/2006 Hyperlipidemia Hypertension Osteoarthritis, left knee Sinusitis, s/p ethmoidectomy and nasal septoplasty Brain Hematoma, history of, 1995, Dr Sherwood Gambler Cardiac arrhythmia, life-threatenning, secondary to calcium channel blockers vs beta-blockers Hiatal hernia GERD Joint pain, chronic, multiple  Gout erectile dysfunction, 2/2 Peyronie's disease  Risk Factors: Smoking Status: quit (03/10/2010)  Review of Systems  The patient denies fever, chest pain, syncope, dyspnea on exertion, peripheral edema, prolonged cough, headaches, hemoptysis, abdominal pain, and melena.    Physical Exam  General:  alert, well-developed, well-nourished, well-hydrated, and overweight-appearing.   Head:  normocephalic.   Nose:  no nasal discharge.   Mouth:  pharynx pink and moist.   Neck:  supple.   Lungs:  normal respiratory effort, normal breath sounds, no crackles, and no wheezes.   Heart:  normal rate, regular rhythm, no murmur, no gallop, and no JVD.   Abdomen:  soft, non-tender, normal bowel sounds, and no distention.   Msk:  normal ROM, no joint swelling, and no joint warmth. Mild tenderness in his knees, shoulders, and ankles, but no erythema , swelling or deformity.  Pulses:  2+ Extremities:  No edema.  Neurologic:  alert & oriented X3, cranial nerves II-XII intact, strength normal in all extremities, sensation intact to light touch, gait normal, and DTRs symmetrical and normal.     Impression &  Recommendations:  Problem # 1:  HYPERTENSION (ICD-401.9) Assessment Unchanged BP is fairly controlled with current meds and will continue them. Also advised weight loss and exercise. He understands these and will try these. Will check /cmet.  His updated medication list for this problem includes:    Hydrochlorothiazide 25 Mg Tabs (Hydrochlorothiazide) .Marland Kitchen... Take 1 tablet by mouth once a day    Clonidine Hcl 0.3 Mg Tabs (Clonidine hcl) .Marland Kitchen... Take 1 tablet by mouth two times a day    Enalapril Maleate 20 Mg Tabs (Enalapril maleate) .Marland Kitchen... Take 2 tablets by mouth once daily    Hydralazine Hcl 25 Mg Tabs (Hydralazine hcl) .Marland Kitchen... Take 2 tabs by mouth two times a day  BP today: 139/87 Prior BP: 140/87 (01/03/2009)  Labs Reviewed: K+: 4.4 (01/03/2009) Creat: : 0.96 (01/03/2009)  Chol: 143 (01/10/2008)   HDL: 45 (01/10/2008)   LDL: 82 (01/10/2008)   TG: 79 (01/10/2008)  Orders: T-CMP with Estimated GFR ([id number])  Problem # 2:  OSTEOARTHRITIS (ICD-715.90) Assessment: Unchanged His multiple joint pain is likely due to OA. Since mobic does not help too much for the pain, also possible side effects causing HTN, will change to tramadol.  Also advised exercise and heating/icy pad.  The following medications were removed from the medication list:    Meloxicam 7.5 Mg Tabs (Meloxicam) .Marland Kitchen... Take 1 tablet by mouth once a day His updated medication list for this problem includes:    Tylenol Arthritis Pain Tbcr (Acetaminophen tbcr) .Marland Kitchen... Dose/frequency not in chart    Tramadol Hcl 50 Mg Tabs (Tramadol hcl) .Marland Kitchen... Take 1 tablet by mouth four times a day as needed for pain  Problem # 3:  HEM, BRAIN NEC W/O OPEN WND, UNSPECIFIED LOC (ICD-853.00) Assessment: Unchanged He has been taking phnobarbitol for a long time, no seizures since then. Will check phenobarbitol level. Orders: T-Phenobarbital [id number])  Problem # 4:  HYPERLIPIDEMIA (P102836.4) Assessment: Unchanged He has no muscle pain, will  check FLP and CMET. Continue pravastatin.  His updated medication list for this problem includes:    Pravastatin Sodium 40 Mg Tabs (Pravastatin sodium) .Marland Kitchen... Take 1 tablet by mouth once a day  Orders: T-Lipid Profile [id number]) T-CMP with Estimated GFR ([id number])  Labs Reviewed: SGOT: 19 (01/03/2009)   SGPT: 24 (01/03/2009)   HDL:45 (01/10/2008), 47 (07/14/2006)  LDL:82 (01/10/2008), 92 (07/14/2006)  Chol:143 (01/10/2008), 152 (07/14/2006)  Trig:79 (01/10/2008), 64 (07/14/2006)  Complete Medication List: 1)  Hydrochlorothiazide 25 Mg Tabs (Hydrochlorothiazide) .... Take 1 tablet by mouth once a day 2)  Clonidine Hcl 0.3 Mg Tabs (Clonidine hcl) .... Take 1 tablet by mouth two times a day 3)  Prilosec 20 Mg Cpdr (Omeprazole) .... Take 1 capsule by mouth once a day 4)  Phenobarbital 64.8 Mg Tabs (Phenobarbital) .... Take 1 tablet by mouth two times a day 5)  Enalapril Maleate 20 Mg Tabs (Enalapril maleate) .... Take 2 tablets by mouth once daily 6)  Hydralazine Hcl 25 Mg Tabs (Hydralazine hcl) .... Take 2 tabs by mouth two times a day 7)  Tylenol Arthritis Pain Tbcr (Acetaminophen tbcr) .... Dose/frequency not in chart 8)  Pravastatin Sodium 40 Mg Tabs (Pravastatin sodium) .... Take 1 tablet by mouth once a day 9)  Mucinex Dm 30-600 Mg Xr12h-tab (Dextromethorphan-guaifenesin) .... Take 1 tablet by mouth once a day as needed for cough and congestion 10)  Colcrys 0.6 Mg Tabs (Colchicine) .... Take 1 tablet by mouth two times a day 11)  Tramadol Hcl 50 Mg Tabs (Tramadol hcl) .... Take 1 tablet by mouth four times a day as needed for pain  Other Orders: Influenza Vaccine MCR [id number]) T-Hemoccult Card-Multiple (take home) [id number])  Patient Instructions: 1)  Please schedule a follow-up appointment in 6 months. 2)  Will call you if any abnormal labs. 3)  It is important that you exercise regularly at least 20 minutes 5 times a week. If you develop chest pain, have severe difficulty  breathing, or feel very tired , stop exercising immediately and seek medical attention. 4)  You need to lose weight. Consider a lower calorie diet and regular exercise.  Prescriptions: COLCRYS 0.6 MG TABS (COLCHICINE) Take 1 tablet by mouth two times a day  #62 x 6   Entered and Authorized by:   Geanie Kenning MD   Signed by:   Geanie Kenning  MD on 03/10/2010   Method used:   Print then Give to Patient   RxID:   [id number] PRAVASTATIN SODIUM 40 MG TABS (PRAVASTATIN SODIUM) Take 1 tablet by mouth once a day  #30 x 6   Entered and Authorized by:   Geanie Kenning MD   Signed by:   Geanie Kenning MD on 03/10/2010   Method used:   Print then Give to Patient   RxID:   [id number] HYDRALAZINE HCL 25 MG  TABS (HYDRALAZINE HCL) Take 2 tabs by mouth two times a day  #120 x 6   Entered and Authorized by:   Geanie Kenning MD   Signed by:   Geanie Kenning MD on 03/10/2010   Method used:   Print then Give to Patient   RxID:   [id number] ENALAPRIL MALEATE 20 MG TABS (ENALAPRIL MALEATE) Take 2 tablets by mouth once daily  #60 x 11   Entered and Authorized by:   Geanie Kenning MD   Signed by:   Geanie Kenning MD on 03/10/2010   Method used:   Print then Give to Patient   RxID:   [id number] PHENOBARBITAL 64.8 MG TABS (PHENOBARBITAL) Take 1 tablet by mouth two times a day  #120 x 5   Entered and Authorized by:   Geanie Kenning MD   Signed by:   Geanie Kenning MD on 03/10/2010   Method used:   Print then Give to Patient   RxID:   [id number] PRILOSEC 20 MG CPDR (OMEPRAZOLE) Take 1 capsule by mouth once a day  #30 x 6   Entered and Authorized by:   Geanie Kenning MD   Signed by:   Geanie Kenning MD on 03/10/2010   Method used:   Print then Give to Patient   RxID:   [id number] CLONIDINE HCL 0.3 MG TABS (CLONIDINE HCL) Take 1 tablet by mouth two times a day  #60 x 6   Entered and Authorized by:   Geanie Kenning MD   Signed by:   Geanie Kenning MD on  03/10/2010   Method used:   Print then Give to Patient   [id number] HYDROCHLOROTHIAZIDE 25 MG TABS (HYDROCHLOROTHIAZIDE) Take 1 tablet by mouth once a day  #30 x 6   Entered and Authorized by:   Geanie Kenning MD   Signed by:   Geanie Kenning MD on 03/10/2010   Method used:   Print then Give to Patient   [id number] TRAMADOL HCL 50 MG TABS (TRAMADOL HCL) Take 1 tablet by mouth four times a day as needed for pain  #90 x 3   Entered and Authorized by:   Geanie Kenning MD   Signed by:   Geanie Kenning MD on 03/10/2010   Method used:   Print then Give to Patient   RxID:   [id number]    Orders Added: 1)  Influenza Vaccine MCR [id number] 2)  T-Hemoccult Card-Multiple (take home) [82270] 3)  T-Lipid Profile [[id number]] 4)  T-Phenobarbital [id number] 5)  T-CMP with Estimated GFR [[address]] 6)  Est. Patient Level IV [id number]   Immunizations Administered:  Influenza Vaccine # 1:    Vaccine Type: Fluvax MCR    Site: left deltoid    Mfr: GlaxoSmithKline    Dose: 0.5 ml    Route: IM    Given by: Yvonna Alanis RN    Exp. Date: 11/01/2010    Lot #: [id number]    VIS given: 11/26/09 version given March 10, 2010.  Flu  Vaccine Consent Questions:    Do you have a history of severe allergic reactions to this vaccine? no    Any prior history of allergic reactions to egg and/or gelatin? no    Do you have a sensitivity to the preservative Thimersol? no    Do you have a past history of Guillan-Barre Syndrome? no    Do you currently have an acute febrile illness? no    Have you ever had a severe reaction to latex? no    Vaccine information given and explained to patient? yes   Immunizations Administered:  Influenza Vaccine # 1:    Vaccine Type: Fluvax MCR    Site: left deltoid    Mfr: GlaxoSmithKline    Dose: 0.5 ml    Route: IM    Given by: Yvonna Alanis RN    Exp. Date: 11/01/2010    Lot #: [id number]    VIS given: 11/26/09 version given  March 10, 2010. Process Orders Tests Sent for requisitioning (March 10, 2010 8:43 PM):     03/10/2010: [facility] -- T-Lipid Profile [phone] (signed)     03/10/2010: [facility] -- T-Phenobarbital [phone] (signed)     03/10/2010: [facility] -- T-CMP with Estimated GFR [id number] (signed)    Prevention & Chronic Care Immunizations   Influenza vaccine: Fluvax MCR  (03/10/2010)   Influenza vaccine deferral: Deferred  (01/03/2009)    Tetanus booster: 01/10/2008: Td   Tetanus booster due: 01/09/2018    Pneumococcal vaccine: Not documented    H. zoster vaccine: Not documented  Colorectal Screening   Hemoccult: Positive  (05/22/2005)   Hemoccult action/deferral: Ordered  (03/10/2010)    Colonoscopy: Abnormal  (06/17/2005)   Colonoscopy due: 06/17/2010  Other Screening   PSA: 0.54  (01/10/2008)   Smoking status: quit  (03/10/2010)  Lipids   Total Cholesterol: 143  (01/10/2008)   Lipid panel action/deferral: Lipid Panel ordered   LDL: 82  (01/10/2008)   LDL Direct: Not documented   HDL: 45  (01/10/2008)   Triglycerides: 79  (01/10/2008)    SGOT (AST): 19  (01/03/2009)   SGPT (ALT): 24  (01/03/2009)   Alkaline phosphatase: 62  (01/03/2009)   Total bilirubin: 0.4  (01/03/2009)    Lipid flowsheet reviewed?: Yes   Progress toward LDL goal: Improved  Hypertension   Last Blood Pressure: 139 / 87  (03/10/2010)   Serum creatinine: 0.96  (01/03/2009)   Serum potassium 4.4  (01/03/2009)    Hypertension flowsheet reviewed?: Yes   Progress toward BP goal: Unchanged  Self-Management Support :   Personal Goals (by the next clinic visit) :      Personal blood pressure goal: 140/90  (03/10/2010)     Personal LDL goal: 100  (03/10/2010)    Patient will work on the following items until the next clinic visit to reach self-care goals:     Medications and monitoring: take my medicines every day, check my blood  pressure  (03/10/2010)     Eating: eat baked foods instead of fried foods  (03/10/2010)     Activity: join a walking program  (03/10/2010)     Other: drinks G2, coffee, adds salt to food- painting outside of house  (03/10/2010)    Hypertension self-management support: Written self-care plan  (03/10/2010)   Hypertension self-care plan printed.    Lipid self-management support: Written self-care plan  (03/10/2010)   Lipid self-care plan printed.   Nursing Instructions: Provide Hemoccult cards with instructions (see order)  Process Orders Tests Sent for requisitioning (March 10, 2010 8:43 PM):     03/10/2010: [facility] -- T-Lipid Profile [phone] (signed)     03/10/2010: [facility] -- T-Phenobarbital [id number] (signed)     03/10/2010: [facility] -- T-CMP with Estimated GFR [id number] (signed)

## 2010-08-22 ENCOUNTER — Other Ambulatory Visit: Payer: Self-pay | Admitting: Internal Medicine

## 2010-09-19 ENCOUNTER — Other Ambulatory Visit: Payer: Self-pay | Admitting: Internal Medicine

## 2010-09-19 NOTE — Op Note (Signed)
Ricky Hayden, Ricky Hayden                           ACCOUNT NO.:  [id number]   MEDICAL RECORD NO.:  [id number]                   PATIENT TYPE:  AMB   LOCATION:  [place]                                  FACILITY:  [facility]   PHYSICIAN:  Christopher E. Lucia Gaskins, M.D.         DATE OF BIRTH:  Jan 07, 1946   DATE OF PROCEDURE:  09/08/2002  DATE OF DISCHARGE:  09/08/2002                                 OPERATIVE REPORT   PREOPERATIVE DIAGNOSIS:  Chronic left maxillary, left anterior ethmoid sinus  disease.   POSTOPERATIVE DIAGNOSIS:  Chronic left maxillary, left anterior ethmoid  sinus disease.   OPERATION PERFORMED:  Functional endoscopic sinus surgery with left  maxillary ostial enlargement with drainage and irrigation of left maxillary  sinus.  Left anterior ethmoidectomy.  Sinus cultures.   SURGEON:  Leonides Sake. Lucia Gaskins, M.D.   ANESTHESIA:  General endotracheal.   COMPLICATIONS:  None.   INDICATIONS FOR PROCEDURE:  The patient is a 65 year old gentleman who has  had chronic left sinus infection for over two months.  He has been on  several rounds of antibiotics including prolonged courses of Augmentin as  well as Levaquin.  He has continued to have purulent discharge from the left  middle meatus and on repeat CT scan has a chronic opacified left maxillary  and partial opacification of left ethmoid region.  The remaining sinuses are  relatively clear.  Because of the persistent disease poorly responsive to  antibiotic therapy, he is taken to the operating room at this time  for  endoscopic drainage of the left maxillary sinus, cultures and irrigation.   DESCRIPTION OF PROCEDURE:  After adequate endotracheal anesthesia, the  patient received 1 gm Ancef IV preoperatively.  The nose was then prepped  with cotton pledgets soaked in topical Afrin for decongestant and the middle  meatus and turbinates were injected with Xylocaine with epinephrine for  local anesthetic and hemostasis.  Using the  straight through-cut forceps,  the anterior ethmoid cells were opened up.  There was very thickened mucosa  but no obvious purulent discharge; however, there was active purulent  discharge coming from the left maxillary ostia and cultures were obtained  from this drainage.  The left maxillary ostia was then enlarged with a  backbiting and through-cut forceps.  There was purulence within the  maxillary sinus as well as a very thickened membrane.  Sinus was suctioned  and then irrigated with antibiotic solution.  After irrigating the anterior  ethmoid and the maxillary sinus.  Hemostasis was obtained with suction  cautery.  A single Kennedy sinus pack was placed in the left middle meatus  and hydrated with Xylocaine with epinephrine.  This completed the procedure.  The patient was  awakened from anesthesia and transferred to the recovery room  postoperatively doing well.  He will continue his present antibiotic and  Levaquin for the next week.  Will have him follow up in my office  in four  days for recheck and have the Kennedy sinus pack removed.  He was given  Tylenol and Tylenol #3 p.r.n. pain.                                                Leonides Sake. Lucia Gaskins, M.D.    CEN/MEDQ  D:  09/08/2002  T:  09/11/2002  Job:  [id number]   cc:   Monia Sabal. Jobe Igo, M.D.  [address]  [place] [address]  Fax: [phone]

## 2010-09-19 NOTE — Discharge Summary (Signed)
Ricky Hayden, Ricky Hayden               ACCOUNT NO.:  [id number]   MEDICAL RECORD NO.:  [id number]          PATIENT TYPE:  OBV   LOCATION:  3705                         FACILITY:  [facility]   PHYSICIAN:  Lucy Chris, MD     DATE OF BIRTH:  Mar 15, 1946   DATE OF ADMISSION:  02/06/2008  DATE OF DISCHARGE:  02/07/2008                               DISCHARGE SUMMARY   DISCHARGE DIAGNOSES:  1. Pleuritic chest pain, ruled out for myocardial infarction with      serial cardiac enzymes and EKGs.  2. History of well-controlled hypertension.  3. History of well-controlled hyperlipidemia.  4. Gout.  5. Osteoarthritis.  6. Gastroesophageal reflux disease.  7. Hiatal hernia.  8. History of brain hematoma in [id number], with a complicated      hospitalization, status post evacuation.  9. History of a cardiac arrhythmia, secondary to calcium-channel      blocker during the previously mentioned hospitalization.  10.History of sinusitis, status post ethmoidectomy and septoplasty.  11.History of shoulder surgery bilaterally for bilateral frozen      shoulders.  12.Peyronie disease, erectile dysfunction.  13.Benign prostatic hypertrophy.  14.Last colonoscopy in 2007.  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   ALLERGIES:  CALCIUM CHANNEL BLOCKERS causing arrhythmia as mentioned  above.   PRIMARY CARE PHYSICIAN:  Stephanie Coup, MD, in [facility].   LIST OF MEDICATION ON DISCHARGE:  The patient was continued on all his  home medicines as follows:  1. Hydrochlorothiazide 25 mg p.o. daily.  2. Clonidine 0.3 mg p.o. b.i.d.  3. Prilosec 20 mg p.o. daily.  4. Enalapril 40 mg p.o. b.i.d.  5. Colchicine 0.6 mg p.o. b.i.d.  6. Hydralazine 25 mg x2 tablets b.i.d.  7. Meloxicam 7.5 mg p.o. daily.  8. Viagra 100 mg half tablet p.r.n.  9. Pravastatin 40 mg p.o. daily.   The patient will have an appointment with Dr.  Tamala Julian, his cardiologist on  February 15, 2008, at 1:15 p.m.  He will also have an appointment in the  Outpatient Clinic and the clinic will call him with the appointment date  and time.  He was advised to follow a low-sodium heart-healthy diet and  activity as tolerated.   PROCEDURES:  The patient had a chest x-ray showing no acute radiographic  abnormality and probable chronic obstructive pulmonary disease, and an  abdominal x-ray that was negative.   CONSULTANTS:  There were no consultants.   BRIEF HISTORY OF PRESENT ILLNESS:  And patient is a 65 year old very  pleasant white man with past medical history as mentioned above  presenting with chest pain that started about 7-8 hours before admission  and was initially sharp, lasting 5-6 minutes and then followed by a dull  pain that lasted until admission.  The pain was located in the left  chest, radiating to the left shoulder it was initially 10/10, but then  subsided.  No shortness of breath, no diaphoresis, no nausea, vomiting,  or diarrhea.  He had some cough, but no sick contacts.  No fever or  chills.  The patient mentioned that the pain was exacerbated by change  in position and by taking a deep breath, but not by food.  It started  while the patient was sitting in the chair and sorting his meds.  He  went and took a nap after the resolution of the sharp pain and woke up  woke up with dull pain.  He followed his wife advice and came to the ED.  He relates some chest pain approximately one episode a month, but much  less intense in this episode and he attributes the pain to hiatal  hernia.  Per wife, he has been having more fatigue in the last 2 months.   PHYSICAL EXAMINATION:  VITAL SIGNS:  Temperature 98.4, blood pressure  127/84, pulse 82, respiration rate 20, and oxygen saturation 96% on room  air.  GENERAL:  He was a pleasant gentleman in no acute distress.  Wife was in  the room with him.  EYES:  PERRLA.  Extraocular  movements intact.  ENT:  Clear.  NECK:  Supple.  No thyromegaly.  RESPIRATORY:  Clear to auscultation bilaterally.  Good air movement.  CARDIOVASCULAR:  Irregularly irregular rhythm (PVCs).  No murmurs, rubs,  or gallops.  Good pedal pulses.  GI:  Soft and protuberant.  Bowel sounds hyperactive.  Nontender and  nondistended.  EXTREMITIES:  Pain at ankle on palpation bilaterally.  No calf  tenderness.  Homans sign negative.  No edema.  SKIN:  Moist.  MENTAL STATUS:  Alert and oriented x3.  NEUROLOGIC:  Muscle strength 5/5 in all 4 and nonfocal.  PSYCH:  Appropriate.   LABORATORY DATA:  Sodium 140, potassium 3.6, chloride 104, bicarb 28,  BUN 9, creatinine 0.91, and glucose 98.  Bilirubin 0.8, alk phos 52, AST  21, ALT 26, protein 6.7, albumin 4.1, calcium 9.1, and lipase 29.  White  blood cell count 4.4, with an ANC of 2.4.  Hemoglobin 16.7, platelets  129, stable from a previous that had a months ago, and MCV 90.1.  Of  note, he had a recent fasting lipid profile in the clinic of 143.  Total  cholesterol; triglycerides 79, HDL 45, and LDL 82.  Three sets of  cardiac enzymes were negative.  TSH was 1.9.  Hemoglobin A1c was 5.4.  Alcohol level was less than 5.  Phenobarbital level 18.9.  Magnesium  2.3.  UDS negative.  Coags negative.   ASSESSMENT AND PLAN:  This is a 65 year old white man with controlled  hypertension and hyperlipidemia presenting with pleuritic chest pain.  1. Pleuritic chest pain.  He has several risk factors for cardiac      disease: age, hypertension, hyperlipidemia, although these are well      controlled as mentioned above.  He has no current tobacco use, but      was a heavy smoker in the past.  He has a family history of cardiac      disease, but not less than 93 year old.  So our first consideration      was acute coronary syndrome.  He did not complain of more shortness      of breath or pain at exertion lately, not even when he rides his      bike at  home, and the pain was mostly pleuritic and changed with      position.  We checked 3 sets of cardiac enzymes that were negative      and EKGs showed possible  to the normalization of T wave in V5 and      V6, but no other changes.  We also considered PE, however, his      Wells criteria totalled 0 and he had no tachycardia and no oxygen      desaturation and the pain was completely resolved by the time we      saw him. He ruled out for pneumonia by lack of fever, chills, a      high white counts, and a normal chest x-ray. Hiatal hernia could      have possibly contributed to the chest pain, but there was no      relationshup with food. We added Protonix to his regimen in the      hospital. The pain was not reproduced by palpation, so less likely      to be coming from the chest wall.  We admitted him to telemetry,      checked cardiac enzymes and EKGs as above.  TSH, hemoglobin A1c,      coags, UDS, urinalysis, and blood alcohol level all normal as      mentioned.  We continued all of his home medicines, gave him      morphine p.r.n. for pain, aspirin daily at 81 mg and a      nitroglycerin sublingual p.r.n. (which nitroglycerin patch and      started in the ED).  We did not start him on beta-blocker and      calcium channel blocker for his possible intolerance resulting in      arrhythmia.  2. Hypertension.  Please see above.  Of note, he was very well-      controlled during this hospitalization and this is also evident      from the clinic note.  3. Osteoarthritis.  We continued Tylenol.  We held meloxicam      inpatient.  4. Gout.  Continue colchicine.  No current attacks.  5. Hyperlipidemia, perfect control per clinic note.  We continued his      pravastatin at the same dose.  6. Thrombocytopenia.  This is consistent with prior values,      questionably from history of alcohol abuse, alternatively can be      from phenobarbital.  7. Prophylaxis.  Heparin t.i.d. for deep venous  thrombosis.  8. Protonix for gastrointestinal and phenobarbital for seizure      prophylaxis.   CONDITION AT DISCHARGE:  Pain free.  No other comfort.   Vital signs at discharge:  Temperature 97.5, pulse 76, respirations 20,  blood pressure 118/81, and oxygen saturation 97% on room air.   LABORATORIES:  Sodium 136, potassium 3.8, chloride 101, CO2 of 29,  glucose 96, BUN 13, creatinine 0.96, and calcium 9.2.      Philemon Kingdom, M.D.  Electronically Signed      Lucy Chris, MD  Electronically Signed    CG/MEDQ  D:  02/09/2008  T:  02/10/2008  Job:  [id number]   cc:   Stephanie Coup, MD  Belva Crome, M.D.

## 2010-09-22 NOTE — Telephone Encounter (Signed)
Please call for the refill for this.   Thanks.

## 2010-09-23 NOTE — Telephone Encounter (Signed)
Rx called in 

## 2010-11-18 ENCOUNTER — Other Ambulatory Visit: Payer: Self-pay | Admitting: Internal Medicine

## 2010-11-18 NOTE — Telephone Encounter (Signed)
Patient must have app't within 1 month.

## 2010-11-23 ENCOUNTER — Encounter: Payer: Self-pay | Admitting: Internal Medicine

## 2010-12-20 ENCOUNTER — Other Ambulatory Visit: Payer: Self-pay | Admitting: Internal Medicine

## 2011-01-20 ENCOUNTER — Other Ambulatory Visit: Payer: Self-pay | Admitting: *Deleted

## 2011-01-20 DIAGNOSIS — I10 Essential (primary) hypertension: Secondary | ICD-10-CM

## 2011-01-20 DIAGNOSIS — K219 Gastro-esophageal reflux disease without esophagitis: Secondary | ICD-10-CM

## 2011-01-20 MED ORDER — HYDROCHLOROTHIAZIDE 25 MG PO TABS: 25.0000 mg | ORAL_TABLET | Freq: Every day | ORAL | Status: DC

## 2011-01-20 MED ORDER — OMEPRAZOLE 20 MG PO CPDR: 20.0000 mg | DELAYED_RELEASE_CAPSULE | Freq: Every day | ORAL | Status: DC

## 2011-01-20 MED ORDER — CLONIDINE HCL 0.3 MG PO TABS: 0.3000 mg | ORAL_TABLET | Freq: Two times a day (BID) | ORAL | Status: DC

## 2011-01-20 NOTE — Telephone Encounter (Signed)
Rxs have been refilled; pt was called but no answer;unable to leave message on machine.

## 2011-01-20 NOTE — Telephone Encounter (Signed)
States he's going out of town; requests refill today.

## 2011-02-02 LAB — DIFFERENTIAL
Basophils Absolute: 0
Basophils Relative: 0
Eosinophils Absolute: 0.2
Eosinophils Relative: 4
Monocytes Absolute: 0.6
Monocytes Relative: 13 — ABNORMAL HIGH
Neutro Abs: 2.4

## 2011-02-02 LAB — ETHANOL: Alcohol, Ethyl (B): <5

## 2011-02-02 LAB — COMPREHENSIVE METABOLIC PANEL
ALT: 26
AST: 21
Albumin: 4.1
Alkaline Phosphatase: 52
BUN: 9
Chloride: 104
GFR calc Af Amer: >60
Potassium: 3.6
Sodium: 140
Total Bilirubin: 0.8
Total Protein: 6.7

## 2011-02-02 LAB — CARDIAC PANEL(CRET KIN+CKTOT+MB+TROPI)
Relative Index: INVALID
Relative Index: INVALID
Total CK: 56
Troponin I: 0.02

## 2011-02-02 LAB — CBC
HCT: 46.7
Platelets: 129 — ABNORMAL LOW
RDW: 12.8
WBC: 4.4

## 2011-02-02 LAB — APTT: aPTT: 26

## 2011-02-02 LAB — BASIC METABOLIC PANEL
CO2: 29
Calcium: 9.2
Creatinine, Ser: 0.96
GFR calc Af Amer: >60

## 2011-02-02 LAB — POCT CARDIAC MARKERS
CKMB, poc: <1 — ABNORMAL LOW
CKMB, poc: <1 — ABNORMAL LOW
Myoglobin, poc: 64
Myoglobin, poc: 71.7
Troponin i, poc: <0.05
Troponin i, poc: <0.05

## 2011-02-02 LAB — URINALYSIS, ROUTINE W REFLEX MICROSCOPIC
Bilirubin Urine: NEGATIVE
Ketones, ur: NEGATIVE
Nitrite: NEGATIVE
Protein, ur: NEGATIVE
Urobilinogen, UA: 1

## 2011-02-02 LAB — RAPID URINE DRUG SCREEN, HOSP PERFORMED
Benzodiazepines: NOT DETECTED
Cocaine: NOT DETECTED
Tetrahydrocannabinol: NOT DETECTED

## 2011-02-02 LAB — PROTIME-INR
INR: 1
Prothrombin Time: 13.2

## 2011-02-02 LAB — PHENOBARBITAL LEVEL: Phenobarbital: 18.9

## 2011-02-02 LAB — TSH: TSH: 1.919

## 2011-02-02 LAB — URINE MICROSCOPIC-ADD ON

## 2011-03-06 ENCOUNTER — Encounter: Payer: Self-pay | Admitting: Internal Medicine

## 2011-03-06 ENCOUNTER — Ambulatory Visit (INDEPENDENT_AMBULATORY_CARE_PROVIDER_SITE_OTHER): Payer: Self-pay | Admitting: Internal Medicine

## 2011-03-06 VITALS — BP 153/90 | HR 68 | Temp 98.0°F | Ht 67.0 in | Wt 228.7 lb

## 2011-03-06 DIAGNOSIS — M109 Gout, unspecified: Secondary | ICD-10-CM

## 2011-03-06 DIAGNOSIS — Z299 Encounter for prophylactic measures, unspecified: Secondary | ICD-10-CM

## 2011-03-06 DIAGNOSIS — R351 Nocturia: Secondary | ICD-10-CM

## 2011-03-06 DIAGNOSIS — M1990 Unspecified osteoarthritis, unspecified site: Secondary | ICD-10-CM

## 2011-03-06 DIAGNOSIS — K219 Gastro-esophageal reflux disease without esophagitis: Secondary | ICD-10-CM

## 2011-03-06 DIAGNOSIS — E785 Hyperlipidemia, unspecified: Secondary | ICD-10-CM

## 2011-03-06 DIAGNOSIS — I10 Essential (primary) hypertension: Secondary | ICD-10-CM

## 2011-03-06 DIAGNOSIS — N400 Enlarged prostate without lower urinary tract symptoms: Secondary | ICD-10-CM

## 2011-03-06 LAB — COMPLETE METABOLIC PANEL WITH GFR
ALT: 39 U/L (ref 0–53)
AST: 25 U/L (ref 0–37)
Albumin: 4.6 g/dL (ref 3.5–5.2)
Alkaline Phosphatase: 69 U/L (ref 39–117)
Glucose, Bld: 89 mg/dL (ref 70–99)
Potassium: 4.1 mEq/L (ref 3.5–5.3)
Sodium: 142 mEq/L (ref 135–145)
Total Bilirubin: 0.4 mg/dL (ref 0.3–1.2)
Total Protein: 7.6 g/dL (ref 6.0–8.3)

## 2011-03-06 LAB — LIPID PANEL
HDL: 43 mg/dL (ref >39)
LDL Cholesterol: 78 mg/dL (ref 0–99)
Total CHOL/HDL Ratio: 3.6 Ratio
Triglycerides: 166 mg/dL — ABNORMAL HIGH (ref <150)
VLDL: 33 mg/dL (ref 0–40)

## 2011-03-06 LAB — TSH: TSH: 1.365 u[IU]/mL (ref 0.350–4.500)

## 2011-03-06 LAB — PSA: PSA: 0.64 ng/mL (ref <=4.00)

## 2011-03-06 MED ORDER — CLONIDINE HCL 0.3 MG PO TABS: 0.3000 mg | ORAL_TABLET | Freq: Two times a day (BID) | ORAL | Status: DC

## 2011-03-06 MED ORDER — HYDRALAZINE HCL 25 MG PO TABS: 25.0000 mg | ORAL_TABLET | Freq: Three times a day (TID) | ORAL | Status: DC

## 2011-03-06 MED ORDER — PHENOBARBITAL 64.8 MG PO TABS: 64.8000 mg | ORAL_TABLET | Freq: Two times a day (BID) | ORAL | Status: DC

## 2011-03-06 MED ORDER — FINASTERIDE 5 MG PO TABS: 5.0000 mg | ORAL_TABLET | Freq: Every day | ORAL | Status: DC

## 2011-03-06 MED ORDER — ENALAPRIL MALEATE 20 MG PO TABS: 20.0000 mg | ORAL_TABLET | Freq: Every day | ORAL | Status: DC

## 2011-03-06 MED ORDER — MELOXICAM 15 MG PO TABS: 15.0000 mg | ORAL_TABLET | Freq: Every day | ORAL | Status: DC

## 2011-03-06 MED ORDER — COLCHICINE 0.6 MG PO TABS: 0.6000 mg | ORAL_TABLET | Freq: Two times a day (BID) | ORAL | Status: DC

## 2011-03-06 MED ORDER — PRAVASTATIN SODIUM 40 MG PO TABS: 40.0000 mg | ORAL_TABLET | Freq: Every day | ORAL | Status: DC

## 2011-03-06 MED ORDER — OMEPRAZOLE 20 MG PO CPDR: 20.0000 mg | DELAYED_RELEASE_CAPSULE | Freq: Every day | ORAL | Status: DC

## 2011-03-06 MED ORDER — HYDROCHLOROTHIAZIDE 25 MG PO TABS: 25.0000 mg | ORAL_TABLET | Freq: Every day | ORAL | Status: DC

## 2011-03-06 MED ORDER — ACETAMINOPHEN ER 650 MG PO TBCR: 650.0000 mg | EXTENDED_RELEASE_TABLET | Freq: Three times a day (TID) | ORAL | Status: DC | PRN

## 2011-03-06 NOTE — Patient Instructions (Signed)
Calorie Counting Diet A calorie counting diet requires you to eat the number of calories that are right for you in a day. Calories are the measurement of how much energy you get from the food you eat. Eating the right amount of calories is important for staying at a healthy weight. If you eat too many calories, your body will store them as fat and you may gain weight. If you eat too few calories, you may lose weight. Counting the number of calories you eat during a day will help you know if you are eating the right amount. A Registered Dietitian can determine how many calories you need in a day. The amount of calories needed varies from person to person. If your goal is to lose weight, you will need to eat fewer calories. Losing weight can benefit you if you are overweight or have health problems such as heart disease, high blood pressure, or diabetes. If your goal is to gain weight, you will need to eat more calories. Gaining weight may be necessary if you have a certain health problem that causes your body to need more energy. TIPS Whether you are increasing or decreasing the number of calories you eat during a day, it may be hard to get used to changes in what you eat and drink. The following are tips to help you keep track of the number of calories you eat.  Measure foods at home with measuring cups. This helps you know the amount of food and number of calories you are eating.   Restaurants often serve food in amounts that are larger than 1 serving. While eating out, estimate how many servings of a food you are given. For example, a serving of cooked rice is  cup or about the size of half of a fist. Knowing serving sizes will help you be aware of how much food you are eating at restaurants.   Ask for smaller portion sizes or child-size portions at restaurants.   Plan to eat half of a meal at a restaurant. Take the rest home or share the other half with a friend.   Read the Nutrition Facts panel on  food labels for calorie content and serving size. You can find out how many servings are in a package, the size of a serving, and the number of calories each serving has.   For example, a package might contain 3 cookies. The Nutrition Facts panel on that package says that 1 serving is 1 cookie. Below that, it will say there are 3 servings in the container. The calories section of the Nutrition Facts label says there are 90 calories. This means there are 90 calories in 1 cookie (1 serving). If you eat 1 cookie you have eaten 90 calories. If you eat all 3 cookies, you have eaten 270 calories (3 servings x 90 calories = 270 calories).  The list below tells you how big or small some common portion sizes are.  1 oz.........4 stacked dice.   3 oz........Marland KitchenDeck of cards.   1 tsp.......Marland KitchenTip of little finger.   1 tbs......Marland KitchenMarland KitchenThumb.   2 tbs.......Marland KitchenGolf ball.    cup......Marland KitchenHalf of a fist.   1 cup.......Marland KitchenA fist.  KEEP A FOOD LOG Write down every food item you eat, the amount you eat, and the number of calories in each food you eat during the day. At the end of the day, you can add up the total number of calories you have eaten. It may help to keep a  list like the one below. Find out the calorie information by reading the Nutrition Facts panel on food labels. Breakfast  Bran cereal (1 cup, 110 calories).   Fat-free milk ( cup, 45 calories).  Snack  Apple (1 medium, 80 calories).  Lunch  Spinach (1 cup, 20 calories).   Tomato ( medium, 20 calories).   Chicken breast strips (3 oz, 165 calories).   Shredded cheddar cheese ( cup, 110 calories).   Light [place] dressing (2 tbs, 60 calories).   Whole-wheat bread (1 slice, 80 calories).   Tub margarine (1 tsp, 35 calories).   Vegetable soup (1 cup, 160 calories).  Dinner  Pork chop (3 oz, 190 calories).   Brown rice (1 cup, 215 calories).   Steamed broccoli ( cup, 20 calories).   Strawberries (1  cup, 65 calories).   Whipped  cream (1 tbs, 50 calories).  Daily Calorie Total: [id number] Document Released: 04/20/2005 Document Revised: 12/31/2010 Document Reviewed: 10/15/2006 [facility] Patient Information 2012 [place].

## 2011-03-08 DIAGNOSIS — Z299 Encounter for prophylactic measures, unspecified: Secondary | ICD-10-CM | POA: Insufficient documentation

## 2011-03-08 NOTE — Assessment & Plan Note (Signed)
Start fenasteride today. Check PSA as requested. Follow up in 4 weeks.

## 2011-03-08 NOTE — Assessment & Plan Note (Signed)
I will check lipid profile.

## 2011-03-08 NOTE — Assessment & Plan Note (Signed)
Patient complaining of generalized aches and pains but no recent flares. Will keep colchicine on.

## 2011-03-08 NOTE — Progress Notes (Signed)
  Subjective:    Patient ID: Ricky Hayden, male    DOB: [birth year], 65 y.o.   MRN: [id number]  HPI  Ricky Hayden comes in today with multiple complaints.  Nocturia and dribbling of urine at the end of urination, wants to start "prostate meds" and get himself checked for prostate cancer.  Medication refill.  Joint pain which are not well controlled at current doses of meds.  Patient's blood pressure is not very well controlled today 153/90.   Patient is obese and wants to loose weight.      Review of Systems  Constitutional: Negative for fever, activity change and appetite change.  HENT: Negative for sore throat.   Respiratory: Negative for cough and shortness of breath.   Cardiovascular: Negative for chest pain and leg swelling.  Gastrointestinal: Negative for nausea, abdominal pain, diarrhea, constipation and abdominal distention.  Genitourinary: Negative for frequency, hematuria and difficulty urinating.  Musculoskeletal: Positive for arthralgias.  Neurological: Negative for dizziness and headaches.  Psychiatric/Behavioral: Negative for suicidal ideas and behavioral problems.       Objective:   Physical Exam  Constitutional: He is oriented to person, place, and time. He appears well-developed and well-nourished.  HENT:  Head: Normocephalic and atraumatic.  Eyes: Conjunctivae and EOM are normal. Pupils are equal, round, and reactive to light. No scleral icterus.  Neck: Normal range of motion. Neck supple. No JVD present. No thyromegaly present.  Cardiovascular: Normal rate, regular rhythm, normal heart sounds and intact distal pulses.  Exam reveals no gallop and no friction rub.   No murmur heard. Pulmonary/Chest: Effort normal and breath sounds normal. No respiratory distress. He has no wheezes. He has no rales.  Abdominal: Soft. Bowel sounds are normal. He exhibits no distension and no mass. There is no tenderness. There is no rebound and no guarding.  Musculoskeletal:  Normal range of motion. He exhibits no edema and no tenderness.  Lymphadenopathy:    He has no cervical adenopathy.  Neurological: He is alert and oriented to person, place, and time.  Psychiatric: He has a normal mood and affect. His behavior is normal.          Assessment & Plan:

## 2011-03-08 NOTE — Assessment & Plan Note (Signed)
Follow up in 4 weeks.  Minimize NSAID use. Check Bmet. Loose weight.

## 2011-03-08 NOTE — Assessment & Plan Note (Signed)
Go up on Mobic to 15 mg a day in the morning instead of 7.5 BID as the pain is mostly during day time. Take an extra 1/2 tab if needed at night.

## 2011-03-10 ENCOUNTER — Other Ambulatory Visit: Payer: Self-pay | Admitting: Internal Medicine

## 2011-03-10 MED ORDER — ENALAPRIL MALEATE 20 MG PO TABS: 20.0000 mg | ORAL_TABLET | Freq: Every day | ORAL | Status: DC

## 2011-03-10 MED ORDER — HYDRALAZINE HCL 25 MG PO TABS: 50.0000 mg | ORAL_TABLET | Freq: Two times a day (BID) | ORAL | Status: DC

## 2011-04-06 ENCOUNTER — Encounter: Payer: Self-pay | Admitting: Internal Medicine

## 2011-04-06 ENCOUNTER — Ambulatory Visit: Payer: Self-pay | Admitting: Internal Medicine

## 2011-08-25 ENCOUNTER — Other Ambulatory Visit: Payer: Self-pay | Admitting: *Deleted

## 2011-08-26 MED ORDER — PHENOBARBITAL 64.8 MG PO TABS: 64.8000 mg | ORAL_TABLET | Freq: Two times a day (BID) | ORAL | Status: DC

## 2011-08-26 NOTE — Telephone Encounter (Signed)
Rx faxed in.

## 2011-09-02 ENCOUNTER — Other Ambulatory Visit: Payer: Self-pay | Admitting: *Deleted

## 2011-09-02 MED ORDER — FINASTERIDE 5 MG PO TABS: 5.0000 mg | ORAL_TABLET | Freq: Every day | ORAL | Status: DC

## 2011-09-21 NOTE — Telephone Encounter (Signed)
[name] from [facility] states unable to use faxed back copy with refills - med was filled 08/25/21 off an old Rx - a verbal ok from 08/26/11 given to [facility] for refill. Hilda Blades Madiha Bambrick RN 09/21/11 9:30AM

## 2011-10-07 ENCOUNTER — Ambulatory Visit (INDEPENDENT_AMBULATORY_CARE_PROVIDER_SITE_OTHER): Payer: Commercial Managed Care - PPO | Admitting: Internal Medicine

## 2011-10-07 ENCOUNTER — Encounter: Payer: Self-pay | Admitting: Internal Medicine

## 2011-10-07 ENCOUNTER — Encounter: Payer: Commercial Managed Care - PPO | Admitting: Internal Medicine

## 2011-10-07 VITALS — BP 128/82 | HR 66 | Temp 97.3°F | Ht 67.0 in | Wt 225.6 lb

## 2011-10-07 DIAGNOSIS — Z299 Encounter for prophylactic measures, unspecified: Secondary | ICD-10-CM

## 2011-10-07 DIAGNOSIS — Z23 Encounter for immunization: Secondary | ICD-10-CM

## 2011-10-07 DIAGNOSIS — I10 Essential (primary) hypertension: Secondary | ICD-10-CM

## 2011-10-07 DIAGNOSIS — K219 Gastro-esophageal reflux disease without esophagitis: Secondary | ICD-10-CM

## 2011-10-07 DIAGNOSIS — M1990 Unspecified osteoarthritis, unspecified site: Secondary | ICD-10-CM

## 2011-10-07 DIAGNOSIS — N400 Enlarged prostate without lower urinary tract symptoms: Secondary | ICD-10-CM

## 2011-10-07 MED ORDER — ENALAPRIL MALEATE 20 MG PO TABS: 20.0000 mg | ORAL_TABLET | Freq: Every day | ORAL | Status: DC

## 2011-10-07 MED ORDER — TRAMADOL HCL 50 MG PO TABS: 50.0000 mg | ORAL_TABLET | Freq: Four times a day (QID) | ORAL | Status: DC | PRN

## 2011-10-07 MED ORDER — FINASTERIDE 5 MG PO TABS: 5.0000 mg | ORAL_TABLET | Freq: Every day | ORAL | Status: DC

## 2011-10-07 MED ORDER — COLCHICINE 0.6 MG PO TABS: 0.6000 mg | ORAL_TABLET | Freq: Two times a day (BID) | ORAL | Status: DC

## 2011-10-07 MED ORDER — PHENOBARBITAL 64.8 MG PO TABS: 64.8000 mg | ORAL_TABLET | Freq: Two times a day (BID) | ORAL | Status: DC

## 2011-10-07 MED ORDER — OMEPRAZOLE 20 MG PO CPDR: 20.0000 mg | DELAYED_RELEASE_CAPSULE | Freq: Every day | ORAL | Status: DC

## 2011-10-07 MED ORDER — CLONIDINE HCL 0.3 MG PO TABS: 0.3000 mg | ORAL_TABLET | Freq: Two times a day (BID) | ORAL | Status: DC

## 2011-10-07 MED ORDER — HYDRALAZINE HCL 25 MG PO TABS: 50.0000 mg | ORAL_TABLET | Freq: Two times a day (BID) | ORAL | Status: DC

## 2011-10-07 MED ORDER — HYDROCHLOROTHIAZIDE 25 MG PO TABS: 25.0000 mg | ORAL_TABLET | Freq: Every day | ORAL | Status: DC

## 2011-10-07 MED ORDER — PRAVASTATIN SODIUM 40 MG PO TABS: 40.0000 mg | ORAL_TABLET | Freq: Every day | ORAL | Status: DC

## 2011-10-07 NOTE — Progress Notes (Signed)
  Subjective:    Patient ID: Ricky Hayden, male    DOB: 1945-07-13, 66 y.o.   MRN: [id number]  HPI  Patient is a 66 year old man with past medical history most significant for prostate hypertrophy, hypertension, gout, reflux disease and osteoarthritis.  He is here today mainly for medication refill.  He denies any new complaints at this time.   Review of Systems  Constitutional: Negative for fever, activity change and appetite change.  HENT: Negative for sore throat.   Respiratory: Negative for cough and shortness of breath.   Cardiovascular: Negative for chest pain and leg swelling.  Gastrointestinal: Negative for nausea, abdominal pain, diarrhea, constipation and abdominal distention.  Genitourinary: Negative for frequency, hematuria and difficulty urinating.  Neurological: Negative for dizziness and headaches.  Psychiatric/Behavioral: Negative for suicidal ideas and behavioral problems.       Objective:   Physical Exam  Constitutional: He is oriented to person, place, and time. He appears well-developed and well-nourished.  HENT:  Head: Normocephalic and atraumatic.  Eyes: Conjunctivae and EOM are normal. Pupils are equal, round, and reactive to light. No scleral icterus.  Neck: Normal range of motion. Neck supple. No JVD present. No thyromegaly present.  Cardiovascular: Normal rate, regular rhythm, normal heart sounds and intact distal pulses.  Exam reveals no gallop and no friction rub.   No murmur heard. Pulmonary/Chest: Effort normal and breath sounds normal. No respiratory distress. He has no wheezes. He has no rales.  Abdominal: Soft. Bowel sounds are normal. He exhibits no distension and no mass. There is no tenderness. There is no rebound and no guarding.  Musculoskeletal: Normal range of motion. He exhibits no edema and no tenderness.  Lymphadenopathy:    He has no cervical adenopathy.  Neurological: He is alert and oriented to person, place, and time.    Psychiatric: He has a normal mood and affect. His behavior is normal.          Assessment & Plan:

## 2011-10-07 NOTE — Patient Instructions (Signed)
Analgesic Nephropathy  An analgesic is a medicine used to relieve pain. Over-the-counter analgesics (medicines bought without a prescription) include:  Aspirin.   Acetaminophen.   Ibuprofen.   Naproxen.  These drugs present no danger for most people when taken in the recommended dose. Some conditions make taking common pain killers dangerous for the kidneys. Taking 1 or a combination of these drugs regularly, over a long period of time, may increase the risk for kidney problems.  CAUSES  Analgesic use has been linked to 2 different forms of kidney damage.  Some reports have linked acute kidney failure to the use of over-the-counter pain killers. These cases involved a single dose in some instances. Generally, the short-term analgesic use was for no more than 10 days. Drugs in this study include:   Aspirin.   Ibuprofen.   Naproxen.   The patients in these reports had risk factors, such as:   Systemic lupus erythematosus.   Advanced age.   Chronic kidney disease.   Recent heavy alcohol use.   A second form of kidney damage can result from taking pain killers every day for several years. It gradually leads to kidney failure. This causes a permanent need for dialysis or a kidney transplant.   Daily use of 2 kinds of pain killers (particularly aspirin and acetaminophen together) with caffeine or codeine are most likely to damage the kidneys. These mixtures are often sold as powders or tablets.   Acute kidney failure requires emergency dialysis to clean the blood. Kidney damage is often reversible. Normal kidney function returns after the emergency is over and the analgesic use is stopped.  HOME CARE INSTRUCTIONS  Patients with conditions that put them at risk for acute kidney failure should check with their caregivers before taking analgesic medicine. People who take over-the-counter pain killers on a regular basis should check with their caregiver. Your caregiver may be able to  recommend a safer alternative. FOR MORE INFORMATION NIDDK: [employer] Document Released: 12/20/2003 Document Revised: 04/09/2011 Document Reviewed: 11/21/2007 [name] Patient Information 2012 [place].

## 2011-10-08 LAB — BASIC METABOLIC PANEL WITH GFR
BUN: 17 mg/dL (ref 6–23)
CO2: 31 mEq/L (ref 19–32)
Calcium: 9.8 mg/dL (ref 8.4–10.5)
Chloride: 103 mEq/L (ref 96–112)
Creat: 1.05 mg/dL (ref 0.50–1.35)
GFR, Est African American: 84 mL/min

## 2011-10-08 NOTE — Assessment & Plan Note (Signed)
Patient's blood pressure is well controlled on current medication regimen. I will obtain basic metabolic profile today to check potassium and creatinine. Patient was advised weight loss to maintain his blood pressure control and improve his general well-being.

## 2011-10-08 NOTE — Assessment & Plan Note (Signed)
Patient's symptoms have significantly improved since starting finasteride.  Continue finasteride at this time.

## 2011-10-08 NOTE — Assessment & Plan Note (Signed)
Patient was to a pneumonia shot. I will give Pneumovax today.

## 2011-10-08 NOTE — Assessment & Plan Note (Signed)
The patient had been on meloxicam on a chronic basis. 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 I would discontinue meloxicam at this time and have him take Tylenol extra strength. If need be we can try tramadol depending on control of his symptoms.

## 2012-04-14 ENCOUNTER — Ambulatory Visit (INDEPENDENT_AMBULATORY_CARE_PROVIDER_SITE_OTHER): Payer: Commercial Managed Care - PPO | Admitting: Internal Medicine

## 2012-04-14 ENCOUNTER — Encounter: Payer: Self-pay | Admitting: Internal Medicine

## 2012-04-14 VITALS — BP 125/82 | HR 71 | Temp 97.2°F | Wt 225.4 lb

## 2012-04-14 DIAGNOSIS — E785 Hyperlipidemia, unspecified: Secondary | ICD-10-CM

## 2012-04-14 DIAGNOSIS — Z23 Encounter for immunization: Secondary | ICD-10-CM

## 2012-04-14 DIAGNOSIS — Z299 Encounter for prophylactic measures, unspecified: Secondary | ICD-10-CM

## 2012-04-14 DIAGNOSIS — M109 Gout, unspecified: Secondary | ICD-10-CM

## 2012-04-14 DIAGNOSIS — I10 Essential (primary) hypertension: Secondary | ICD-10-CM

## 2012-04-14 DIAGNOSIS — N400 Enlarged prostate without lower urinary tract symptoms: Secondary | ICD-10-CM

## 2012-04-14 DIAGNOSIS — IMO0002 Reserved for concepts with insufficient information to code with codable children: Secondary | ICD-10-CM

## 2012-04-14 DIAGNOSIS — K219 Gastro-esophageal reflux disease without esophagitis: Secondary | ICD-10-CM

## 2012-04-14 DIAGNOSIS — M436 Torticollis: Secondary | ICD-10-CM

## 2012-04-14 LAB — CBC
MCH: 30.1 pg (ref 26.0–34.0)
MCV: 87.1 fL (ref 78.0–100.0)
Platelets: 152 10*3/uL (ref 150–400)
RDW: 13.6 % (ref 11.5–15.5)
WBC: 6.9 10*3/uL (ref 4.0–10.5)

## 2012-04-14 LAB — LIPID PANEL
HDL: 43 mg/dL (ref >39)
Total CHOL/HDL Ratio: 4.3 Ratio

## 2012-04-14 LAB — URIC ACID: Uric Acid, Serum: 7.5 mg/dL (ref 4.0–7.8)

## 2012-04-14 MED ORDER — ENALAPRIL MALEATE 20 MG PO TABS: 20.0000 mg | ORAL_TABLET | Freq: Every day | ORAL | Status: DC

## 2012-04-14 MED ORDER — NAPROXEN 500 MG PO TABS: 500.0000 mg | ORAL_TABLET | Freq: Two times a day (BID) | ORAL | Status: DC | PRN

## 2012-04-14 MED ORDER — HYDRALAZINE HCL 25 MG PO TABS: 50.0000 mg | ORAL_TABLET | Freq: Two times a day (BID) | ORAL | Status: DC

## 2012-04-14 MED ORDER — HYDROCHLOROTHIAZIDE 25 MG PO TABS: 25.0000 mg | ORAL_TABLET | Freq: Every day | ORAL | Status: DC

## 2012-04-14 MED ORDER — CLONIDINE HCL 0.3 MG PO TABS: 0.3000 mg | ORAL_TABLET | Freq: Two times a day (BID) | ORAL | Status: DC

## 2012-04-14 MED ORDER — COLCHICINE 0.6 MG PO TABS: 0.6000 mg | ORAL_TABLET | Freq: Two times a day (BID) | ORAL | Status: DC

## 2012-04-14 MED ORDER — PRAVASTATIN SODIUM 40 MG PO TABS: 40.0000 mg | ORAL_TABLET | Freq: Every day | ORAL | Status: DC

## 2012-04-14 MED ORDER — OMEPRAZOLE 20 MG PO CPDR: 20.0000 mg | DELAYED_RELEASE_CAPSULE | Freq: Every day | ORAL | Status: DC

## 2012-04-14 MED ORDER — FINASTERIDE 5 MG PO TABS: 5.0000 mg | ORAL_TABLET | Freq: Every day | ORAL | Status: DC

## 2012-04-14 MED ORDER — PHENOBARBITAL 64.8 MG PO TABS: 64.8000 mg | ORAL_TABLET | Freq: Two times a day (BID) | ORAL | Status: DC

## 2012-04-14 NOTE — Assessment & Plan Note (Signed)
The patient notes neck stiffness with extremes of ROM.  Physical exam largely benign, with nearly complete ROM, though with some reported discomfort with extremes of ROM.  Patient's symptoms may have started as a muscle strain. -PT referral for neck stretching exercises -naproxen prn for pain

## 2012-04-14 NOTE — Patient Instructions (Signed)
General Instructions: For your gout, we are checking a Uric Acid level today.  Based on the results, we will likely start a medication called Allopurinol.  Take 1 tablet, once per day.  Starting or Stopping Allopurinol abruptly can trigger a gout flare, so for the first week of taking this medication, continue to take colchicine twice per day.  After the first week, you may stop taking the colchicine, and use it only as needed for flares.  For your neck stiffness, we are referring you to Physical Therapy.  In the meantime, you may take Naproxen, 1 tablet up to twice per day as needed for pain.  Please return for a follow-up visit in 6 months.   Treatment Goals:  Goals (1 Years of Data) as of 04/14/2012    None      Progress Toward Treatment Goals:  Treatment Goal 04/14/2012  Blood pressure at goal    Self Care Goals & Plans:  Self Care Goal 04/14/2012  Manage my medications bring my medications to every visit; take my medicines as prescribed  Monitor my health keep track of my weight  Eat healthy foods eat smaller portions  Be physically active find time in my schedule       Care Management & Community Referrals:  Referral 04/14/2012  Referrals made for care management support none needed

## 2012-04-14 NOTE — Assessment & Plan Note (Signed)
Will recheck lipid panel today.  Last LDL = 78 -continue pravastatin

## 2012-04-14 NOTE — Progress Notes (Signed)
HPI The patient is a 66 y.o. male with a history of HTN, HL, gout, OA, presenting for a routine follow-up visit.  The patient is concerned that he has drowsiness after taking his morning and evening medications.  This problem has been occuring for the last 3 years.  The patient has been taking phenobarbital for the last 15 years.  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  He does not still follow with [facility].  The patient only takes tramadol every once in a while, but does note sedation after taking this medication.  The patient has a history of gout, first episode in the late 90's.  He now has 0-2 episodes of gout/year.  His flares occur in his feet only.  He notes taking Colchicine 2 tabs/day for several years.  He has never heard of allopurinol.  No uric acid level in the computer.  For the last 2 months, he has had pain in his neck with flexion, extension, and bilateral rotation to the extremes of ROM.  He states that it feels like a "tightness" in his neck.  The pain is not present when the patient looks straight ahead.  ROS: General: no fevers, chills, changes in weight, changes in appetite Skin: no rash HEENT: no blurry vision, hearing changes, sore throat Pulm: no dyspnea, coughing, wheezing CV: no chest pain, palpitations, shortness of breath Abd: no abdominal pain, nausea/vomiting, diarrhea/constipation GU: no dysuria, hematuria, polyuria Ext: no arthralgias, myalgias Neuro: no weakness, numbness, or tingling  Filed Vitals:   04/14/12 1540  BP: 125/82  Pulse: 71  Temp: 97.2 F (36.2 C)    PEX General: alert, cooperative, and in no apparent distress HEENT: pupils equal round and reactive to light, vision grossly intact, oropharynx clear and non-erythematous  Neck: supple, no lymphadenopathy.  Nearly full ROM without discomfort, though with discomfort produced on full flexion, extension, and bilateral  rotation to the extremes of ROM.  No cervical spinal or paraspinal muscle tenderness Lungs: clear to ascultation bilaterally, normal work of respiration, no wheezes, rales, ronchi Heart: regular rate and rhythm, no murmurs, gallops, or rubs Abdomen: soft, non-tender, non-distended, normal bowel sounds Extremities: no cyanosis, clubbing, or edema Neurologic: alert & oriented X3, cranial nerves II-XII intact, strength grossly intact, sensation intact to light touch  Current Outpatient Prescriptions on File Prior to Visit  Medication Sig Dispense Refill  . cloNIDine (CATAPRES) 0.3 MG tablet Take 1 tablet (0.3 mg total) by mouth 2 (two) times daily.  60 tablet  5  . colchicine 0.6 MG tablet Take 1 tablet (0.6 mg total) by mouth 2 (two) times daily.  60 tablet  11  . enalapril (VASOTEC) 20 MG tablet Take 1 tablet (20 mg total) by mouth daily.  30 tablet  11  . finasteride (PROSCAR) 5 MG tablet Take 1 tablet (5 mg total) by mouth daily.  30 tablet  5  . hydrALAZINE (APRESOLINE) 25 MG tablet Take 2 tablets (50 mg total) by mouth 2 (two) times daily.  120 tablet  11  . hydrochlorothiazide (HYDRODIURIL) 25 MG tablet Take 1 tablet (25 mg total) by mouth daily.  30 tablet  11  . omeprazole (PRILOSEC) 20 MG capsule Take 1 capsule (20 mg total) by mouth daily.  30 capsule  5  . PHENobarbital (LUMINAL) 64.8 MG tablet Take 1 tablet (64.8 mg total) by mouth 2 (two) times daily.  60 tablet  5  . pravastatin (PRAVACHOL) 40  MG tablet Take 1 tablet (40 mg total) by mouth daily.  30 tablet  11  . traMADol (ULTRAM) 50 MG tablet Take 1 tablet (50 mg total) by mouth every 6 (six) hours as needed for pain.  90 tablet  3    Assessment/Plan

## 2012-04-14 NOTE — Assessment & Plan Note (Signed)
BP at goal -continue current medications

## 2012-04-14 NOTE — Assessment & Plan Note (Addendum)
The patient notes a history of gout since the 1990's, though it is unclear if this diagnosis was made via fluid aspiration analysis.  He has been taking colchicine 0.6 mg BID daily. -check uric acid level today -if uric acid > 6 (which I suspect will be the case), will start Allopurinol 100 mg/day -patient instructed to overlap allopurinol and colchicine for 1 week, then stop colchicine and use only prn -will titrate allopurinol to goal <6  Addendum 12/13: The patient's Uric acid was 7.5.  I called patient, but was unable to reach him, and voicemail "not yet set up".  Will start allopurinol 100 mg daily as discussed.

## 2012-04-14 NOTE — Assessment & Plan Note (Signed)
The patient notes sedation after taking his medications.  We discussed that phenobarbital and tramadol can be sedating, but I advise to continue phenobarbital, and patient only rarely takes tramadol.  Fatigue has been a reported side effect of colchicine, which we intend to stop after starting allopurinol. -wean off colchicine -avoid tramadol -if symptoms don't improve, consider lab work-up at next visit (B12, TSH, vitamin D)

## 2012-04-15 MED ORDER — ALLOPURINOL 100 MG PO TABS: 100.0000 mg | ORAL_TABLET | Freq: Every day | ORAL | Status: DC

## 2012-04-15 NOTE — Addendum Note (Signed)
Addended by: Hester Mates on: 04/15/2012 07:07 PM   Modules accepted: Orders

## 2012-04-18 ENCOUNTER — Encounter: Payer: Self-pay | Admitting: Internal Medicine

## 2012-04-19 ENCOUNTER — Encounter: Payer: Self-pay | Admitting: Internal Medicine

## 2012-04-21 ENCOUNTER — Encounter: Payer: Self-pay | Admitting: Internal Medicine

## 2012-04-21 MED ORDER — PREDNISONE 10 MG PO TABS: ORAL_TABLET | ORAL | Status: DC

## 2012-05-02 ENCOUNTER — Encounter: Payer: Self-pay | Admitting: Internal Medicine

## 2012-05-02 NOTE — Telephone Encounter (Signed)
Talked with pt's wife and she states pt's swelling ( from gout) has not resolved.  Pain is down to 6/10. He has not started on allopurinol. Can you call with instructions. Correct phone # [phone]

## 2012-05-06 ENCOUNTER — Telehealth: Payer: Self-pay | Admitting: *Deleted

## 2012-05-06 ENCOUNTER — Encounter: Payer: Self-pay | Admitting: Internal Medicine

## 2012-05-06 NOTE — Telephone Encounter (Signed)
Ricky Hayden is sending pt to [facility] as we have no openings and are already trying to work in two other pts.

## 2012-05-06 NOTE — Telephone Encounter (Signed)
agree

## 2012-05-06 NOTE — Telephone Encounter (Signed)
Pt's wife calls and states he is very congested and she thinks he has pneumonia, he advised to go to ED asap. No appts available today

## 2012-05-07 ENCOUNTER — Encounter: Payer: Self-pay | Admitting: Internal Medicine

## 2012-05-07 ENCOUNTER — Emergency Department (HOSPITAL_COMMUNITY): Payer: Commercial Managed Care - PPO

## 2012-05-07 ENCOUNTER — Encounter (HOSPITAL_COMMUNITY): Payer: Self-pay | Admitting: Emergency Medicine

## 2012-05-07 ENCOUNTER — Emergency Department (HOSPITAL_COMMUNITY)
Admission: EM | Admit: 2012-05-07 | Discharge: 2012-05-07 | Disposition: A | Discharge status: Home / Self Care | Payer: Commercial Managed Care - PPO | Attending: Emergency Medicine | Admitting: Emergency Medicine

## 2012-05-07 DIAGNOSIS — E785 Hyperlipidemia, unspecified: Secondary | ICD-10-CM | POA: Insufficient documentation

## 2012-05-07 DIAGNOSIS — J40 Bronchitis, not specified as acute or chronic: Secondary | ICD-10-CM | POA: Insufficient documentation

## 2012-05-07 DIAGNOSIS — G8929 Other chronic pain: Secondary | ICD-10-CM | POA: Insufficient documentation

## 2012-05-07 DIAGNOSIS — M1710 Unilateral primary osteoarthritis, unspecified knee: Secondary | ICD-10-CM | POA: Insufficient documentation

## 2012-05-07 DIAGNOSIS — Z8679 Personal history of other diseases of the circulatory system: Secondary | ICD-10-CM | POA: Insufficient documentation

## 2012-05-07 DIAGNOSIS — Z791 Long term (current) use of non-steroidal anti-inflammatories (NSAID): Secondary | ICD-10-CM | POA: Insufficient documentation

## 2012-05-07 DIAGNOSIS — R062 Wheezing: Secondary | ICD-10-CM | POA: Insufficient documentation

## 2012-05-07 DIAGNOSIS — K219 Gastro-esophageal reflux disease without esophagitis: Secondary | ICD-10-CM | POA: Insufficient documentation

## 2012-05-07 DIAGNOSIS — R059 Cough, unspecified: Secondary | ICD-10-CM | POA: Insufficient documentation

## 2012-05-07 DIAGNOSIS — Z79899 Other long term (current) drug therapy: Secondary | ICD-10-CM | POA: Insufficient documentation

## 2012-05-07 DIAGNOSIS — Z8709 Personal history of other diseases of the respiratory system: Secondary | ICD-10-CM | POA: Insufficient documentation

## 2012-05-07 DIAGNOSIS — Z8719 Personal history of other diseases of the digestive system: Secondary | ICD-10-CM | POA: Insufficient documentation

## 2012-05-07 DIAGNOSIS — Z87891 Personal history of nicotine dependence: Secondary | ICD-10-CM | POA: Insufficient documentation

## 2012-05-07 DIAGNOSIS — I2510 Atherosclerotic heart disease of native coronary artery without angina pectoris: Secondary | ICD-10-CM | POA: Insufficient documentation

## 2012-05-07 DIAGNOSIS — R05 Cough: Secondary | ICD-10-CM | POA: Insufficient documentation

## 2012-05-07 DIAGNOSIS — I10 Essential (primary) hypertension: Secondary | ICD-10-CM | POA: Insufficient documentation

## 2012-05-07 DIAGNOSIS — M109 Gout, unspecified: Secondary | ICD-10-CM | POA: Insufficient documentation

## 2012-05-07 DIAGNOSIS — R0989 Other specified symptoms and signs involving the circulatory and respiratory systems: Secondary | ICD-10-CM | POA: Insufficient documentation

## 2012-05-07 HISTORY — DX: Atherosclerotic heart disease of native coronary artery without angina pectoris: I25.10

## 2012-05-07 MED ORDER — ALBUTEROL SULFATE HFA 108 (90 BASE) MCG/ACT IN AERS
2.0000 | INHALATION_SPRAY | Freq: Once | RESPIRATORY_TRACT | Status: AC
  Administered 2012-05-07: 2 via RESPIRATORY_TRACT
  Filled 2012-05-07: qty 6.7

## 2012-05-07 MED ORDER — ALBUTEROL SULFATE (5 MG/ML) 0.5% IN NEBU
5.0000 mg | INHALATION_SOLUTION | Freq: Once | RESPIRATORY_TRACT | Status: AC
  Administered 2012-05-07: 5 mg via RESPIRATORY_TRACT
  Filled 2012-05-07: qty 1

## 2012-05-07 MED ORDER — PREDNISONE 20 MG PO TABS: 40.0000 mg | ORAL_TABLET | Freq: Once | ORAL | Status: DC

## 2012-05-07 MED ORDER — IPRATROPIUM BROMIDE 0.02 % IN SOLN
0.5000 mg | Freq: Once | RESPIRATORY_TRACT | Status: AC
  Administered 2012-05-07: 0.5 mg via RESPIRATORY_TRACT
  Filled 2012-05-07: qty 2.5

## 2012-05-07 MED ORDER — PREDNISONE 20 MG PO TABS
60.0000 mg | ORAL_TABLET | Freq: Once | ORAL | Status: AC
  Administered 2012-05-07: 60 mg via ORAL
  Filled 2012-05-07: qty 3

## 2012-05-07 MED ORDER — GUAIFENESIN-CODEINE 100-10 MG/5ML PO SOLN: 5.0000 mL | Freq: Once | ORAL | Status: DC

## 2012-05-07 MED ORDER — GUAIFENESIN-CODEINE 100-10 MG/5ML PO SOLN
5.0000 mL | Freq: Once | ORAL | Status: AC
  Administered 2012-05-07: 5 mL via ORAL
  Filled 2012-05-07: qty 5

## 2012-05-07 NOTE — ED Notes (Signed)
"  I feel about the same."

## 2012-05-07 NOTE — ED Notes (Signed)
Pt c/o chest congestion and sob, cough..onset [place] day.

## 2012-05-07 NOTE — ED Notes (Signed)
Patient transported to X-ray 

## 2012-05-07 NOTE — ED Provider Notes (Signed)
History    66yM with cough and chest "congestion" since Jan 1. Persistent cough. Nonproductive. No fever or chills. No unusual leg pains or swelling. Mild sob. Feels like he is wheezing.  No cp. Has tried otc cough meds w/o much relief.   CSN: [id number]  Arrival date & time 05/07/12  1154   First MD Initiated Contact with Patient 05/07/12 1159      Chief Complaint  Patient presents with  . Shortness of Breath    (Consider location/radiation/quality/duration/timing/severity/associated sxs/prior treatment) HPI  Past Medical History  Diagnosis Date  . Hyperlipidemia   . Hypertension   . Arthritis     osteoarthritis of left knee  . Sinusitis     s/p ethmoidectomy and nasal septoplasty  . GERD (gastroesophageal reflux disease)   . Hiatal hernia   . Gout   . 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 Intracranial hematoma 1995    history of, s/p evacuation by Dr. Sherwood Gambler  . 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 Chronic joint pain   . Coronary artery disease     Past Surgical History  Procedure Date  . Craniotomy   . Sinus surgery with instatrak     ethmoidectomy and nasal septum repair    No family history on file.  History  Substance Use Topics  . Smoking status: Former Smoker    Quit date: 05/04/1986  . Smokeless tobacco: Not on file  . Alcohol Use: No      Review of Systems  All systems reviewed and negative, other than as noted in HPI.   Allergies  Calcium channel blockers  Home Medications   Current Outpatient Rx  Name  Route  Sig  Dispense  Refill  . ALLOPURINOL 100 MG PO TABS   Oral   Take 1 tablet (100 mg total) by mouth daily.   30 tablet   5   . CLONIDINE HCL 0.3 MG PO TABS   Oral   Take 1 tablet (0.3 mg total) by mouth 2 (two) times daily.   60 tablet   11   . COLCHICINE 0.6 MG PO TABS   Oral   Take 1 tablet (0.6 mg total) by mouth 2 (two) times daily.   60 tablet   11   . ENALAPRIL MALEATE 20 MG PO TABS   Oral   Take 1 tablet (20 mg total) by mouth daily.   30 tablet   11   . FINASTERIDE 5 MG PO TABS   Oral   Take 1 tablet (5 mg total) by mouth daily.   30 tablet   11   . GUAIFENESIN-CODEINE 100-10 MG/5ML PO SOLN   Oral   Take 5 mLs by mouth once.   120 mL   0   . HYDRALAZINE HCL 25 MG PO TABS   Oral   Take 2 tablets (50 mg total) by mouth 2 (two) times daily.   120 tablet   11   . HYDROCHLOROTHIAZIDE 25 MG PO TABS   Oral   Take 1 tablet (25 mg total) by mouth daily.   30 tablet   11   . NAPROXEN 500 MG PO TABS   Oral   Take 1 tablet (500 mg total) by mouth 2 (two) times daily as needed.   60 tablet   1   . OMEPRAZOLE 20 MG PO CPDR   Oral   Take 1 capsule (  20 mg total) by mouth daily.   30 capsule   11   . PHENOBARBITAL 64.8 MG PO TABS   Oral   Take 1 tablet (64.8 mg total) by mouth 2 (two) times daily.   60 tablet   5   . PRAVASTATIN SODIUM 40 MG PO TABS   Oral   Take 1 tablet (40 mg total) by mouth daily.   30 tablet   11   . PREDNISONE 20 MG PO TABS   Oral   Take 2 tablets (40 mg total) by mouth once.   10 tablet   0   . TRAMADOL HCL 50 MG PO TABS   Oral   Take 1 tablet (50 mg total) by mouth every 6 (six) hours as needed for pain.   90 tablet   3     BP 139/86  Pulse 84  Temp 97.4 F (36.3 C) (Oral)  Resp 18  SpO2 99%  Physical Exam  Nursing note and vitals reviewed. Constitutional: He appears well-developed and well-nourished. No distress.  HENT:  Head: Normocephalic and atraumatic.  Eyes: Conjunctivae normal are normal. Right eye exhibits no discharge. Left eye exhibits no discharge.  Neck: Neck supple.  Cardiovascular: Normal rate, regular rhythm and normal heart sounds.  Exam reveals no gallop and no friction rub.   No murmur heard. Pulmonary/Chest: Effort normal. No respiratory distress. He has wheezes.  Abdominal: Soft. He exhibits no distension. There is no tenderness.    Musculoskeletal: He exhibits no edema and no tenderness.       Lower extremities symmetric as compared to each other. No calf tenderness. Negative Homan's. No palpable cords.   Neurological: He is alert.  Skin: Skin is warm and dry. No pallor.  Psychiatric: He has a normal mood and affect. His behavior is normal. Thought content normal.    ED Course  Procedures (including critical care time)  Labs Reviewed - No data to display Dg Chest 2 View  05/07/2012  *RADIOLOGY REPORT*  Clinical Data: Cough and dyspnea  CHEST - 2 VIEW  Comparison: 02/06/2008  Findings: The cardiac shadow is stable.  The lungs are clear bilaterally. Degenerative changes of the thoracic spine are seen.  IMPRESSION: No acute abnormality noted.   Original Report Authenticated By: Inez Catalina, M.D.      1. Bronchitis       MDM  [phone] with cough and congestion. Mild wheezing on exam. Not hypoxic or with significant increased WOB. Plan course of steroids and cough meds. Return precautions discussed.         Virgel Manifold, MD 05/10/12 [phone]

## 2012-05-10 ENCOUNTER — Encounter: Payer: Self-pay | Admitting: *Deleted

## 2012-05-16 NOTE — Addendum Note (Signed)
Addended by: Hulan Fray on: 05/16/2012 06:24 PM   Modules accepted: Orders

## 2012-06-14 ENCOUNTER — Encounter: Payer: Self-pay | Admitting: Internal Medicine

## 2012-06-14 MED ORDER — NAPROXEN 500 MG PO TABS: 500.0000 mg | ORAL_TABLET | Freq: Two times a day (BID) | ORAL | Status: DC | PRN

## 2012-06-15 ENCOUNTER — Encounter: Payer: Self-pay | Admitting: Internal Medicine

## 2012-06-20 MED ORDER — PREDNISONE 10 MG PO TABS: ORAL_TABLET | ORAL | Status: DC

## 2012-10-05 ENCOUNTER — Ambulatory Visit (INDEPENDENT_AMBULATORY_CARE_PROVIDER_SITE_OTHER): Payer: Commercial Managed Care - PPO | Admitting: Internal Medicine

## 2012-10-05 ENCOUNTER — Encounter: Payer: Self-pay | Admitting: Internal Medicine

## 2012-10-05 VITALS — BP 156/101 | HR 80 | Temp 97.6°F | Ht 67.0 in | Wt 225.5 lb

## 2012-10-05 DIAGNOSIS — I10 Essential (primary) hypertension: Secondary | ICD-10-CM

## 2012-10-05 DIAGNOSIS — K219 Gastro-esophageal reflux disease without esophagitis: Secondary | ICD-10-CM

## 2012-10-05 DIAGNOSIS — M109 Gout, unspecified: Secondary | ICD-10-CM

## 2012-10-05 DIAGNOSIS — N481 Balanitis: Secondary | ICD-10-CM | POA: Insufficient documentation

## 2012-10-05 DIAGNOSIS — N476 Balanoposthitis: Secondary | ICD-10-CM

## 2012-10-05 DIAGNOSIS — K5900 Constipation, unspecified: Secondary | ICD-10-CM | POA: Insufficient documentation

## 2012-10-05 DIAGNOSIS — H8110 Benign paroxysmal vertigo, unspecified ear: Secondary | ICD-10-CM | POA: Insufficient documentation

## 2012-10-05 LAB — POCT GLYCOSYLATED HEMOGLOBIN (HGB A1C): Hemoglobin A1C: 5.4

## 2012-10-05 LAB — GLUCOSE, CAPILLARY: Glucose-Capillary: 88 mg/dL (ref 70–99)

## 2012-10-05 MED ORDER — COLCHICINE 0.6 MG PO TABS: 0.6000 mg | ORAL_TABLET | Freq: Two times a day (BID) | ORAL | Status: DC | PRN

## 2012-10-05 MED ORDER — ALBUTEROL SULFATE HFA 108 (90 BASE) MCG/ACT IN AERS: 2.0000 | INHALATION_SPRAY | Freq: Four times a day (QID) | RESPIRATORY_TRACT | Status: DC | PRN

## 2012-10-05 MED ORDER — MECLIZINE HCL 12.5 MG PO TABS: 12.5000 mg | ORAL_TABLET | Freq: Three times a day (TID) | ORAL | Status: AC | PRN

## 2012-10-05 MED ORDER — DOCUSATE SODIUM 100 MG PO CAPS: 100.0000 mg | ORAL_CAPSULE | Freq: Every day | ORAL | Status: DC | PRN

## 2012-10-05 MED ORDER — PRAVASTATIN SODIUM 40 MG PO TABS: 40.0000 mg | ORAL_TABLET | Freq: Every day | ORAL | Status: DC

## 2012-10-05 MED ORDER — FINASTERIDE 5 MG PO TABS: 5.0000 mg | ORAL_TABLET | Freq: Every day | ORAL | Status: DC

## 2012-10-05 MED ORDER — OMEPRAZOLE 20 MG PO CPDR: 20.0000 mg | DELAYED_RELEASE_CAPSULE | Freq: Every day | ORAL | Status: DC

## 2012-10-05 MED ORDER — CLONIDINE HCL 0.3 MG PO TABS: 0.3000 mg | ORAL_TABLET | Freq: Two times a day (BID) | ORAL | Status: DC

## 2012-10-05 MED ORDER — NAPROXEN 500 MG PO TABS: 500.0000 mg | ORAL_TABLET | Freq: Two times a day (BID) | ORAL | Status: DC | PRN

## 2012-10-05 MED ORDER — ALLOPURINOL 100 MG PO TABS: 100.0000 mg | ORAL_TABLET | Freq: Every day | ORAL | Status: DC

## 2012-10-05 MED ORDER — HYDRALAZINE HCL 25 MG PO TABS: 50.0000 mg | ORAL_TABLET | Freq: Two times a day (BID) | ORAL | Status: DC

## 2012-10-05 MED ORDER — HYDROCHLOROTHIAZIDE 25 MG PO TABS: 25.0000 mg | ORAL_TABLET | Freq: Every day | ORAL | Status: DC

## 2012-10-05 MED ORDER — PHENOBARBITAL 64.8 MG PO TABS: 64.8000 mg | ORAL_TABLET | Freq: Two times a day (BID) | ORAL | Status: DC

## 2012-10-05 MED ORDER — FLUCONAZOLE 100 MG PO TABS: 100.0000 mg | ORAL_TABLET | Freq: Every day | ORAL | Status: AC

## 2012-10-05 MED ORDER — ENALAPRIL MALEATE 20 MG PO TABS: 20.0000 mg | ORAL_TABLET | Freq: Every day | ORAL | Status: DC

## 2012-10-05 NOTE — Assessment & Plan Note (Signed)
The patient presents with balanitis.  The inciting etiology is likely fungal, though other differential diagnoses include bacterial infection vs GC/chlamydia.  The patient has no known history of DM. -check UA, urine culture, urine GC/Chlamydia, A1C -continue lotrimin -start treatment with diflucan

## 2012-10-05 NOTE — Assessment & Plan Note (Signed)
BP Readings from Last 3 Encounters:  10/05/12 156/101  05/07/12 137/89  04/14/12 125/82    Lab Results  Component Value Date   NA 141 10/07/2011   K 4.6 10/07/2011   CREATININE 1.05 10/07/2011    Assessment: Blood pressure control: mildly elevated Progress toward BP goal:  unchanged Comments: BP mildly elevated today, likely due to penile pain.  Will reassess at patient's next visit  Plan: Medications:  continue current medications Educational resources provided:   Self management tools provided:   Other plans: Recheck at next visit

## 2012-10-05 NOTE — Assessment & Plan Note (Addendum)
The patient notes a history of vertigo, with recent mild recurrence of symptoms.  Patient is currently asymptomatic.  Based on his descriptions of his symptoms, the etiology remains unclear.  Orthostatic vital signs today were negative. -patient to return to care if symptoms recur.  If truly BPPV, patient may benefit from neurovestibular rehab -patient may try meclizine prn if needed

## 2012-10-05 NOTE — Progress Notes (Signed)
HPI The patient is a 67 y.o. male with a history of HTN, gout, HL, BPH, presenting for a routine visit.  The patient's wife notes that he has a BM once/week.  He notes occasional straining.  They have tried laxatives, and a high-fiber diet, with little success in improving his constipation.  The patient reports a history of vertigo in the past, lasting several days at a time.  For the last month, the patient notes symptoms of dizziness upon awakening in the morning, typically 1-2x/week, resolving spontaneously after about 30 minutes.  He describes as the room moving.  No recent cough, cold, or fevers.  The patient is asymptomatic today.  The patient notes a 2-week history of penile pain, burning, and mild bleeding.  He is uncircumcised.  He has tried using lotrimin cream with no success.  He notes no fevers, chills.  He has only been sexually active with 1 partner for 40 years, his wife.  He has a remote history of gonorrhea >40 years ago, but no other STD's.  He notes no dysuria or penile discharge.  The patient has a history of gout.  Allopurinol was started 6 months ago.  ROS: General: no fevers, chills, changes in weight, changes in appetite Skin: no rash HEENT: no blurry vision, hearing changes, sore throat Pulm: no dyspnea, coughing, wheezing CV: no chest pain, palpitations, shortness of breath Abd: no abdominal pain, nausea/vomiting, diarrhea/constipation GU: no dysuria, hematuria, polyuria Ext: no arthralgias, myalgias Neuro: no weakness, numbness, or tingling  Filed Vitals:   10/05/12 1347  BP: 153/100  Pulse: 80  Temp: 97.6 F (36.4 C)   Orthostatic Vital Signs Lying: 155/98, HR 80 Sitting: 147/97, HR 80 Standing: 156/101, HR 80  PEX General: alert, cooperative, and in no apparent distress HEENT: pupils equal round and reactive to light, vision grossly intact, oropharynx clear and non-erythematous  Neck: supple, no lymphadenopathy Lungs: clear to ascultation  bilaterally, normal work of respiration, no wheezes, rales, ronchi Heart: regular rate and rhythm, no murmurs, gallops, or rubs Abdomen: soft, non-tender, non-distended, normal bowel sounds Groin: foreskin easily retractable, several erosions around foreskin, some with white plaque surrounding area Extremities: no cyanosis, clubbing, or edema Neurologic: alert & oriented X3, cranial nerves II-XII intact, strength grossly intact, sensation intact to light touch  Current Outpatient Prescriptions on File Prior to Visit  Medication Sig Dispense Refill  . allopurinol (ZYLOPRIM) 100 MG tablet Take 1 tablet (100 mg total) by mouth daily.  30 tablet  5  . cloNIDine (CATAPRES) 0.3 MG tablet Take 1 tablet (0.3 mg total) by mouth 2 (two) times daily.  60 tablet  11  . colchicine 0.6 MG tablet Take 1 tablet (0.6 mg total) by mouth 2 (two) times daily.  60 tablet  11  . DM-Doxylamine-Acetaminophen (NYQUIL COLD & FLU PO) Take 1 capsule by mouth at bedtime as needed. For cold      . enalapril (VASOTEC) 20 MG tablet Take 1 tablet (20 mg total) by mouth daily.  30 tablet  11  . finasteride (PROSCAR) 5 MG tablet Take 1 tablet (5 mg total) by mouth daily.  30 tablet  11  . guaiFENesin-codeine 100-10 MG/5ML syrup Take 5 mLs by mouth once.  120 mL  0  . hydrALAZINE (APRESOLINE) 25 MG tablet Take 2 tablets (50 mg total) by mouth 2 (two) times daily.  120 tablet  11  . hydrochlorothiazide (HYDRODIURIL) 25 MG tablet Take 1 tablet (25 mg total) by mouth daily.  30 tablet  11  . naproxen (NAPROSYN) 500 MG tablet Take 1 tablet (500 mg total) by mouth 2 (two) times daily as needed.  60 tablet  1  . omeprazole (PRILOSEC) 20 MG capsule Take 1 capsule (20 mg total) by mouth daily.  30 capsule  11  . PHENobarbital (LUMINAL) 64.8 MG tablet Take 1 tablet (64.8 mg total) by mouth 2 (two) times daily.  60 tablet  5  . pravastatin (PRAVACHOL) 40 MG tablet Take 1 tablet (40 mg total) by mouth daily.  30 tablet  11  . predniSONE  (DELTASONE) 10 MG tablet Take 4 tabs/day for 2 days, then 3 tabs/day for 2 days, then 2 tabs/day for 2 days, then 1 tab/day for 2 days  20 tablet  0  . Pseudoephedrine-APAP-DM (DAYQUIL MULTI-SYMPTOM COLD/FLU PO) Take 1 capsule by mouth 2 (two) times daily as needed. For cold symptoms       No current facility-administered medications on file prior to visit.    Assessment/Plan

## 2012-10-05 NOTE — Patient Instructions (Addendum)
General Instructions: Your penile pain is due to a condition called Balanitis.  Most likely, this is caused by a fungal infection.   -continue using Lotrimin cream -additionally, take Diflucan, 1 tablet daily for 2 weeks  For your gout, we are re-checking your uric acid level today.  For your vertigo, you may use Meclizine as needed for symptomatic relief.  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  These are not harmful  Please return for a follow-up visit in 6 months, or sooner if needed   Treatment Goals:  Goals (1 Years of Data) as of 10/05/12   None      Progress Toward Treatment Goals:  Treatment Goal 10/05/2012  Blood pressure unchanged    Self Care Goals & Plans:  Self Care Goal 10/05/2012  Manage my medications take my medicines as prescribed; bring my medications to every visit  Monitor my health keep track of my weight  Eat healthy foods eat smaller portions  Be physically active take a walk every day       Care Management & Community Referrals:  Referral 10/05/2012  Referrals made for care management support none needed      Balanitis Balanitis is an common infection of the head (glans) of the penis. CAUSES  Balanitis has multiple causes. Frequently balanitis is the result of poor personal hygiene. Especially if no circumcision has been done. Without adequate washing, many different kinds of germs (viruses, bacteria, and yeast) collect between the foreskin and the glans. This can cause an infection. Lack of air and irritation from a normal secretion called smegma contribute to the cause in uncircumcised males. Other causes include chemical irritation by certain soaps (especially soaps with perfumes). When no circumcision has been done, a frequent cause of poor hygiene is that the tip of the foreskin is tight (phimosis) and cannot be pulled back for adequate washing. Illnesses in other areas of the body can also cause balanitis. This includes illnesses that  cause water retention and swelling, such as:  Heart failure.  Cirrhosis of the liver.  Kidney problems. Other contributing causes include:  Obesity.  Certain allergies to drugs such as tetracycline and sulfa.  Diabetes. SYMPTOMS  Symptoms may include:  Discharge coming from under the foreskin.  Tenderness.  Itching and inability to get an erection (because of the pain).  Redness and a rash is frequently seen.  If the problem remains for a while, sores can be seen on the glans and on the foreskin. If the condition is not treated other complications such as a scar of the opening to the urethra (tube that carries the urine out from the bladder) can occur and block the bladder. This narrowing is called meatal stenosis. Other problems can occur such as:  Infection of the lymph nodes in the crease of the groin.  Ballooning of the foreskin when voiding (when the foreskin opening has scarred down and been made smaller).  Blockage of the bladder.  Frequent urinary infections occur in children with balanitis. HOME CARE INSTRUCTIONS   Pull back foreskin to urinate and when washing.  Pull back foreskin when putting medication on the affected area to prevent the foreskin from swelling and being trapped behind the head.  Keep foreskin and glans clean and dry.  Sitz baths may be helpful.  Take your medication as directed .  Pain medication, if needed.  Circumcision (may be recommended). SEEK IMMEDIATE MEDICAL CARE IF:   The affected area becomes trapped behind the head.  You start a fever.  The swelling increases. Document Released: 09/06/2008 Document Revised: 07/13/2011 Document Reviewed: 09/06/2008 [facility] Patient Information 2014 [place], [place].

## 2012-10-05 NOTE — Assessment & Plan Note (Signed)
The patient notes bothersome constipation. -start docusate, 100-200 mg daily -continue high-fiber diet -use laxatives only for refractory constipation

## 2012-10-05 NOTE — Assessment & Plan Note (Addendum)
The patient has a history of gout, with several flares per year.  Allopurinol was started 04/2012 with a uric acid of 7.5 at the time. -repeat uric acid level, increase allopurinol as indicated  Addendum: Uric acid was 6.2.  With the severity of this patient's gout, he would benefit from tight control of his uric acid.  Will increase amlodipine from 100 mg daily to 200 mg daily.

## 2012-10-06 LAB — URINALYSIS, ROUTINE W REFLEX MICROSCOPIC
Glucose, UA: NEGATIVE mg/dL
Hgb urine dipstick: NEGATIVE
Protein, ur: NEGATIVE mg/dL
pH: 6 (ref 5.0–8.0)

## 2012-10-06 LAB — URIC ACID: Uric Acid, Serum: 6.2 mg/dL (ref 4.0–7.8)

## 2012-10-06 LAB — URINALYSIS, MICROSCOPIC ONLY
Bacteria, UA: NONE SEEN
Casts: NONE SEEN
Crystals: NONE SEEN
Squamous Epithelial / LPF: NONE SEEN

## 2012-10-06 MED ORDER — ALLOPURINOL 100 MG PO TABS: 200.0000 mg | ORAL_TABLET | Freq: Every day | ORAL | Status: DC

## 2012-10-06 NOTE — Progress Notes (Signed)
INTERNAL MEDICINE TEACHING ATTENDING ADDENDUM: I discussed this case with Dr. Owens Shark at the time of patient visit. I have read the documentation and I agree with the plan of care. Please see the resident note for details of management.

## 2012-10-06 NOTE — Addendum Note (Signed)
Addended by: Hester Mates on: 10/06/2012 01:59 PM   Modules accepted: Orders

## 2012-11-03 ENCOUNTER — Encounter: Payer: Self-pay | Admitting: Internal Medicine

## 2012-11-10 ENCOUNTER — Encounter: Payer: Self-pay | Admitting: Internal Medicine

## 2012-11-10 ENCOUNTER — Ambulatory Visit (INDEPENDENT_AMBULATORY_CARE_PROVIDER_SITE_OTHER): Payer: Commercial Managed Care - PPO | Admitting: Internal Medicine

## 2012-11-10 VITALS — BP 150/93 | HR 69 | Temp 96.8°F | Wt 229.2 lb

## 2012-11-10 DIAGNOSIS — N481 Balanitis: Secondary | ICD-10-CM

## 2012-11-10 DIAGNOSIS — N476 Balanoposthitis: Secondary | ICD-10-CM

## 2012-11-10 DIAGNOSIS — H8110 Benign paroxysmal vertigo, unspecified ear: Secondary | ICD-10-CM

## 2012-11-10 NOTE — Assessment & Plan Note (Signed)
The patient presents for follow-up for balanitis.  Treatment with diflucan and antifungal creams has improved the area somewhat, but the patient still notes pain.  Testing for DM, UTI, GC/Chlamydia at his last visit were all negative.   -will refer to Urology for further diagnosis and treatment

## 2012-11-10 NOTE — Progress Notes (Signed)
HPI The patient is a 67 y.o. male with a history of gout, HTN, BPH, presenting for a follow-up visit.  The patient was seen at his last visit for balanitis.  Work-up included A1C = 5.4, UA with trace LE but no WBC's, urine culture negative, GC/chlamydia negative.  It was thought to be fungal in etiology, he was prescribed diflucan and an antifungal cream.  He notes that the area initially improved, but has not fully resolved, and remains painful.  He notes no fevers, discharge.  The patient also notes that symptoms of vertigo have worsened since his last visit, with a sensation of "room spinning" which are now occurring 2-4 times per month, typically occuring when sitting from a lying position in the morning.  Symptoms last 20-30 minutes.  He was prescribed meclizine at his last visit, but notes no significant improvement.  ROS: General: no fevers, chills, changes in weight, changes in appetite Skin: see HPI HEENT: no blurry vision, hearing changes, sore throat Pulm: no dyspnea, coughing, wheezing CV: no chest pain, palpitations, shortness of breath Abd: no abdominal pain, nausea/vomiting, diarrhea/constipation GU: see HPI Ext: no arthralgias, myalgias Neuro: no weakness, numbness, or tingling  Filed Vitals:   11/10/12 1658  BP: 150/93  Pulse: 69  Temp: 96.8 F (36 C)    PEX General: alert, cooperative, and in no apparent distress HEENT: pupils equal round and reactive to light, vision grossly intact, oropharynx clear and non-erythematous  Neck: supple, no lymphadenopathy Lungs: clear to ascultation bilaterally, normal work of respiration, no wheezes, rales, ronchi Heart: regular rate and rhythm, no murmurs, gallops, or rubs Abdomen: soft, non-tender, non-distended, normal bowel sounds Groin: Head and distal shaft of penis with 3-4 patches of hypopigmented skin with thin white plaques Extremities: no cyanosis, clubbing, or edema Neurologic: alert & oriented X3, cranial nerves  II-XII intact, strength grossly intact, sensation intact to light touch. Dix-Halpike reproduced symptoms of room spinning with right-to-left head motion, though not with left-to-right head motion  Current Outpatient Prescriptions on File Prior to Visit  Medication Sig Dispense Refill  . albuterol (PROVENTIL HFA;VENTOLIN HFA) 108 (90 BASE) MCG/ACT inhaler Inhale 2 puffs into the lungs every 6 (six) hours as needed for wheezing.  1 Inhaler  3  . allopurinol (ZYLOPRIM) 100 MG tablet Take 2 tablets (200 mg total) by mouth daily.  60 tablet  11  . cloNIDine (CATAPRES) 0.3 MG tablet Take 1 tablet (0.3 mg total) by mouth 2 (two) times daily.  60 tablet  11  . colchicine 0.6 MG tablet Take 1 tablet (0.6 mg total) by mouth 2 (two) times daily as needed. Use only during gout flare  60 tablet  11  . docusate sodium (COLACE) 100 MG capsule Take 1-2 capsules (100-200 mg total) by mouth daily as needed for constipation.  60 capsule  3  . enalapril (VASOTEC) 20 MG tablet Take 1 tablet (20 mg total) by mouth daily.  30 tablet  11  . finasteride (PROSCAR) 5 MG tablet Take 1 tablet (5 mg total) by mouth daily.  30 tablet  11  . fluconazole (DIFLUCAN) 100 MG tablet Take 1 tablet (100 mg total) by mouth daily.  14 tablet  0  . hydrALAZINE (APRESOLINE) 25 MG tablet Take 2 tablets (50 mg total) by mouth 2 (two) times daily.  120 tablet  11  . hydrochlorothiazide (HYDRODIURIL) 25 MG tablet Take 1 tablet (25 mg total) by mouth daily.  30 tablet  11  . meclizine (ANTIVERT) 12.5 MG tablet  Take 1 tablet (12.5 mg total) by mouth 3 (three) times daily as needed for dizziness or nausea.  60 tablet  1  . naproxen (NAPROSYN) 500 MG tablet Take 1 tablet (500 mg total) by mouth 2 (two) times daily as needed.  60 tablet  3  . omeprazole (PRILOSEC) 20 MG capsule Take 1 capsule (20 mg total) by mouth daily.  30 capsule  11  . PHENobarbital (LUMINAL) 64.8 MG tablet Take 1 tablet (64.8 mg total) by mouth 2 (two) times daily.  60 tablet   5  . pravastatin (PRAVACHOL) 40 MG tablet Take 1 tablet (40 mg total) by mouth daily.  30 tablet  11   No current facility-administered medications on file prior to visit.    Assessment/Plan

## 2012-11-10 NOTE — Patient Instructions (Addendum)
General Instructions: For your balanitis, we are referring you to Urology.  We will contact you with an appointment.  Your symptoms of dizziness are likely due to Vertigo (see information below).  We are referring you for vestibular rehab.  Please return for a follow-up visit in 3-4 months.   Treatment Goals:  Goals (1 Years of Data) as of 11/10/12   None      Progress Toward Treatment Goals:  Treatment Goal 11/10/2012  Blood pressure unchanged    Self Care Goals & Plans:  Self Care Goal 10/05/2012  Manage my medications take my medicines as prescribed; bring my medications to every visit  Monitor my health keep track of my weight  Eat healthy foods eat smaller portions  Be physically active take a walk every day       Care Management & Community Referrals:  Referral 11/10/2012  Referrals made for care management support none needed      Benign Positional Vertigo Vertigo means you feel like you or your surroundings are moving when they are not. Benign positional vertigo is the most common form of vertigo. Benign means that the cause of your condition is not serious. Benign positional vertigo is more common in older adults. CAUSES  Benign positional vertigo is the result of an upset in the labyrinth system. This is an area in the middle ear that helps control your balance. This may be caused by a viral infection, head injury, or repetitive motion. However, often no specific cause is found. SYMPTOMS  Symptoms of benign positional vertigo occur when you move your head or eyes in different directions. Some of the symptoms may include:  Loss of balance and falls.  Vomiting.  Blurred vision.  Dizziness.  Nausea.  Involuntary eye movements (nystagmus). DIAGNOSIS  Benign positional vertigo is usually diagnosed by physical exam. If the specific cause of your benign positional vertigo is unknown, your caregiver may perform imaging tests, such as magnetic resonance imaging  (MRI) or computed tomography (CT). TREATMENT  Your caregiver may recommend movements or procedures to correct the benign positional vertigo. Medicines such as meclizine, benzodiazepines, and medicines for nausea may be used to treat your symptoms. In rare cases, if your symptoms are caused by certain conditions that affect the inner ear, you may need surgery. HOME CARE INSTRUCTIONS   Follow your caregiver's instructions.  Move slowly. Do not make sudden body or head movements.  Avoid driving.  Avoid operating heavy machinery.  Avoid performing any tasks that would be dangerous to you or others during a vertigo episode.  Drink enough fluids to keep your urine clear or pale yellow. SEEK IMMEDIATE MEDICAL CARE IF:   You develop problems with walking, weakness, numbness, or using your arms, hands, or legs.  You have difficulty speaking.  You develop severe headaches.  Your nausea or vomiting continues or gets worse.  You develop visual changes.  Your family or friends notice any behavioral changes.  Your condition gets worse.  You have a fever.  You develop a stiff neck or sensitivity to light. MAKE SURE YOU:   Understand these instructions.  Will watch your condition.  Will get help right away if you are not doing well or get worse. Document Released: 01/26/2006 Document Revised: 07/13/2011 Document Reviewed: 01/08/2011 [facility] Patient Information 2014 [place].

## 2012-11-10 NOTE — Assessment & Plan Note (Signed)
The patient notes worsening of symptoms of "room spinning" with head movement in the morning.  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  The patient had no relief with meclizine. -will refer to vestibular rehab for further treatment

## 2012-11-11 NOTE — Progress Notes (Signed)
Case discussed with Dr. Brown at the time of the visit.  We reviewed the resident's history and exam and pertinent patient test results.  I agree with the assessment, diagnosis, and plan of care documented in the resident's note. 

## 2012-12-21 ENCOUNTER — Encounter: Payer: Self-pay | Admitting: *Deleted

## 2012-12-21 ENCOUNTER — Encounter: Payer: Self-pay | Admitting: Internal Medicine

## 2013-01-23 ENCOUNTER — Ambulatory Visit: Payer: Commercial Managed Care - PPO | Attending: Internal Medicine | Admitting: Physical Therapy

## 2013-01-23 DIAGNOSIS — H8110 Benign paroxysmal vertigo, unspecified ear: Secondary | ICD-10-CM | POA: Insufficient documentation

## 2013-01-23 DIAGNOSIS — IMO0001 Reserved for inherently not codable concepts without codable children: Secondary | ICD-10-CM | POA: Insufficient documentation

## 2013-01-25 ENCOUNTER — Encounter: Payer: Self-pay | Admitting: Internal Medicine

## 2013-01-30 ENCOUNTER — Ambulatory Visit: Payer: Commercial Managed Care - PPO | Admitting: Physical Therapy

## 2013-02-06 ENCOUNTER — Ambulatory Visit: Payer: Commercial Managed Care - PPO | Admitting: Physical Therapy

## 2013-02-13 ENCOUNTER — Encounter: Payer: Self-pay | Admitting: Internal Medicine

## 2013-02-13 ENCOUNTER — Ambulatory Visit (INDEPENDENT_AMBULATORY_CARE_PROVIDER_SITE_OTHER): Payer: Commercial Managed Care - PPO | Admitting: Internal Medicine

## 2013-02-13 ENCOUNTER — Ambulatory Visit: Payer: Commercial Managed Care - PPO | Admitting: Internal Medicine

## 2013-02-13 VITALS — BP 144/91 | HR 79 | Temp 96.8°F | Ht 67.0 in | Wt 232.0 lb

## 2013-02-13 DIAGNOSIS — Z23 Encounter for immunization: Secondary | ICD-10-CM

## 2013-02-13 DIAGNOSIS — I10 Essential (primary) hypertension: Secondary | ICD-10-CM

## 2013-02-13 DIAGNOSIS — Z299 Encounter for prophylactic measures, unspecified: Secondary | ICD-10-CM

## 2013-02-13 DIAGNOSIS — N476 Balanoposthitis: Secondary | ICD-10-CM

## 2013-02-13 DIAGNOSIS — N481 Balanitis: Secondary | ICD-10-CM

## 2013-02-13 DIAGNOSIS — K5900 Constipation, unspecified: Secondary | ICD-10-CM

## 2013-02-13 DIAGNOSIS — K219 Gastro-esophageal reflux disease without esophagitis: Secondary | ICD-10-CM

## 2013-02-13 DIAGNOSIS — M109 Gout, unspecified: Secondary | ICD-10-CM

## 2013-02-13 LAB — BASIC METABOLIC PANEL
Calcium: 9.8 mg/dL (ref 8.4–10.5)
Chloride: 102 mEq/L (ref 96–112)
Creat: 0.97 mg/dL (ref 0.50–1.35)
Potassium: 4.6 mEq/L (ref 3.5–5.3)

## 2013-02-13 LAB — CBC
Hemoglobin: 16.1 g/dL (ref 13.0–17.0)
MCH: 30.1 pg (ref 26.0–34.0)
MCHC: 35.2 g/dL (ref 30.0–36.0)
Platelets: 157 10*3/uL (ref 150–400)
RBC: 5.34 MIL/uL (ref 4.22–5.81)
WBC: 8.2 10*3/uL (ref 4.0–10.5)

## 2013-02-13 LAB — HIV ANTIBODY (ROUTINE TESTING W REFLEX): HIV: NONREACTIVE

## 2013-02-13 MED ORDER — PRAVASTATIN SODIUM 40 MG PO TABS: 40.0000 mg | ORAL_TABLET | Freq: Every day | ORAL | Status: DC

## 2013-02-13 MED ORDER — FLUCONAZOLE 150 MG PO TABS: 150.0000 mg | ORAL_TABLET | Freq: Every day | ORAL | Status: DC

## 2013-02-13 MED ORDER — HYDROCORTISONE 1 % EX CREA: TOPICAL_CREAM | CUTANEOUS | Status: DC

## 2013-02-13 MED ORDER — HYDRALAZINE HCL 25 MG PO TABS: 50.0000 mg | ORAL_TABLET | Freq: Two times a day (BID) | ORAL | Status: DC

## 2013-02-13 MED ORDER — NAPROXEN 500 MG PO TABS: 500.0000 mg | ORAL_TABLET | Freq: Two times a day (BID) | ORAL | Status: DC | PRN

## 2013-02-13 MED ORDER — PHENOBARBITAL 64.8 MG PO TABS: 64.8000 mg | ORAL_TABLET | Freq: Two times a day (BID) | ORAL | Status: DC

## 2013-02-13 MED ORDER — CLOTRIMAZOLE 1 % EX CREA: TOPICAL_CREAM | Freq: Two times a day (BID) | CUTANEOUS | Status: DC

## 2013-02-13 MED ORDER — ALLOPURINOL 100 MG PO TABS: 200.0000 mg | ORAL_TABLET | Freq: Every day | ORAL | Status: DC

## 2013-02-13 MED ORDER — ALBUTEROL SULFATE HFA 108 (90 BASE) MCG/ACT IN AERS: 2.0000 | INHALATION_SPRAY | Freq: Four times a day (QID) | RESPIRATORY_TRACT | Status: DC | PRN

## 2013-02-13 MED ORDER — FINASTERIDE 5 MG PO TABS: 5.0000 mg | ORAL_TABLET | Freq: Every day | ORAL | Status: DC

## 2013-02-13 MED ORDER — COLCHICINE 0.6 MG PO TABS: 0.6000 mg | ORAL_TABLET | Freq: Two times a day (BID) | ORAL | Status: DC | PRN

## 2013-02-13 MED ORDER — OMEPRAZOLE 20 MG PO CPDR: 20.0000 mg | DELAYED_RELEASE_CAPSULE | Freq: Every day | ORAL | Status: DC

## 2013-02-13 MED ORDER — HYDROCHLOROTHIAZIDE 25 MG PO TABS: 25.0000 mg | ORAL_TABLET | Freq: Every day | ORAL | Status: DC

## 2013-02-13 MED ORDER — DOCUSATE SODIUM 100 MG PO CAPS: 100.0000 mg | ORAL_CAPSULE | Freq: Every day | ORAL | Status: DC | PRN

## 2013-02-13 MED ORDER — ENALAPRIL MALEATE 20 MG PO TABS: 20.0000 mg | ORAL_TABLET | Freq: Every day | ORAL | Status: DC

## 2013-02-13 MED ORDER — POLYETHYLENE GLYCOL 3350 17 GM/SCOOP PO POWD: 17.0000 g | Freq: Every day | ORAL | Status: DC | PRN

## 2013-02-13 MED ORDER — CLONIDINE HCL 0.3 MG PO TABS: 0.3000 mg | ORAL_TABLET | Freq: Two times a day (BID) | ORAL | Status: DC

## 2013-02-13 NOTE — Assessment & Plan Note (Addendum)
The patient has history of gout, with last uric acid = 6.2. Flares are getting fewer, but the patient notes an active gout flare now. -Continue allopurinol 200 mg daily -Start colchicine for acute treatment -Start naproxen for acute treatment -consider d/c'ing HCTZ at his next visit, which may improve gout symptoms

## 2013-02-13 NOTE — Patient Instructions (Signed)
General Instructions: For your gout: 1. Take Colchicine, 2 tablets once now, then 1 tablet twice per day until your symptoms resolve (short term) 2. Take Naproxen, 1 tablet up to twice per day as needed for acute pain (short term) 3. Continue to take Allopurinol, 2 tabs daily as prescribed (long term)  The lesions on your penis likely represent Balanitis.  To treat this: 1. Apply clotrimazole cream to the area twice per day 2. Apply hydrocortisone cream to the area once per day for 2-4 weeks only, then stop 3. Use diflucan, 1 tablet daily for 14 days.  Start this medication after your gout flare resolves.  For constipation 1. Continue to use Colace daily 2. Add an over-the-counter fiber supplement, up to 3 times per day to regulate your bowel movements 3. If needed, you can use occasional Miralax, a laxative, to have a bowel movement.  Please return for a follow-up visit in 2-3 months.  Treatment Goals:  Goals (1 Years of Data) as of 02/13/13   None      Progress Toward Treatment Goals:  Treatment Goal 02/13/2013  Blood pressure at goal    Self Care Goals & Plans:  Self Care Goal 02/13/2013  Manage my medications bring my medications to every visit; refill my medications on time; follow the sick day instructions if I am sick  Monitor my health keep track of my blood pressure  Eat healthy foods eat more vegetables; eat fruit for snacks and desserts; eat baked foods instead of fried foods  Be physically active find an activity I enjoy; take a walk every day    No flowsheet data found.   Care Management & Community Referrals:  Referral 02/13/2013  Referrals made for care management support none needed

## 2013-02-13 NOTE — Assessment & Plan Note (Addendum)
The patient notes persistent balanitis, despite treatment with Diflucan and over-the-counter Tinactin cream. Differential for etiology includes Candida versus mechanical irritation. balanitis xerotica obliterans less likely, though worth considering if symptoms do not improve. -Change from Tinactin to clotrimazole cream, twice a day -Add 1% hydrocortisone cream daily for 2-4 weeks only then discontinue -Retry a 14 day course of Diflucan. Patient instructed to start this after his acute gout flare resolves, to avoid interaction with colchicine -check HIV today, since this was not checked last time

## 2013-02-13 NOTE — Progress Notes (Signed)
HPI The patient is a 67 y.o. male with a history of gout, hypertension, BPH, presenting for a routine followup visit.  The patient has history of gout. Since starting allopurinol, he notes that his flares are becoming fewer and farther between. However, the patient notes that he has a 1-day history of L 1st toe pain.  Patient notes walking more frequently, which he is unsure as the cause of the pain.  He has not started any treatment for this acute flare yet.  The patient has a history of balanitis, initially diagnosed 10/2012. Initial treatment consisted of a 14 day course of Diflucan, and over-the-counter antifungal cream. Workup included A1c, urinalysis with culture, and gonorrhea/chlamydia, all which were normal.  He was referred to Urology, but notes that they didn't prescribe any new medications, and the patient has not returned there..  The patient has been using Tinactin cream since 10/2012, which helped some at first, but symptoms have persisted.  The patient notes continued pain and ulcers around the area, but no dysuria.  The patient washes with Zest soap once/day.  The patient continues to note constipation, with 1 bowel movement/week.  He has been taking docusate, 1 pill once/day. He notes no improvement.  ROS: General: no fevers, chills, changes in weight, changes in appetite Skin: no rash HEENT: no blurry vision, hearing changes, sore throat Pulm: no dyspnea, coughing, wheezing CV: no chest pain, palpitations, shortness of breath Abd: no abdominal pain, nausea/vomiting, diarrhea/constipation GU: no dysuria, hematuria, polyuria Ext: see HPI Neuro: no weakness, numbness, or tingling  Filed Vitals:   02/13/13 1100  BP: 144/91  Pulse: 79  Temp: 96.8 F (36 C)    PEX General: alert, cooperative, and in no apparent distress HEENT: pupils equal round and reactive to light, vision grossly intact, oropharynx clear and non-erythematous  Neck: supple Lungs: clear to ascultation  bilaterally, normal work of respiration, no wheezes, rales, ronchi Heart: regular rate and rhythm, no murmurs, gallops, or rubs Abdomen: soft, non-tender, non-distended, normal bowel sounds GU: After retraction of foreskin, and the distal shaft and glans penis have few scattered shallow ulcers with whitish exudate surrounding Extremities: no cyanosis, clubbing, or edema Neurologic: alert & oriented X3, cranial nerves II-XII intact, strength grossly intact, sensation intact to light touch  Current Outpatient Prescriptions on File Prior to Visit  Medication Sig Dispense Refill  . albuterol (PROVENTIL HFA;VENTOLIN HFA) 108 (90 BASE) MCG/ACT inhaler Inhale 2 puffs into the lungs every 6 (six) hours as needed for wheezing.  1 Inhaler  3  . allopurinol (ZYLOPRIM) 100 MG tablet Take 2 tablets (200 mg total) by mouth daily.  60 tablet  11  . cloNIDine (CATAPRES) 0.3 MG tablet Take 1 tablet (0.3 mg total) by mouth 2 (two) times daily.  60 tablet  11  . colchicine 0.6 MG tablet Take 1 tablet (0.6 mg total) by mouth 2 (two) times daily as needed. Use only during gout flare  60 tablet  11  . docusate sodium (COLACE) 100 MG capsule Take 1-2 capsules (100-200 mg total) by mouth daily as needed for constipation.  60 capsule  3  . enalapril (VASOTEC) 20 MG tablet Take 1 tablet (20 mg total) by mouth daily.  30 tablet  11  . finasteride (PROSCAR) 5 MG tablet Take 1 tablet (5 mg total) by mouth daily.  30 tablet  11  . hydrALAZINE (APRESOLINE) 25 MG tablet Take 2 tablets (50 mg total) by mouth 2 (two) times daily.  120 tablet  11  .  hydrochlorothiazide (HYDRODIURIL) 25 MG tablet Take 1 tablet (25 mg total) by mouth daily.  30 tablet  11  . meclizine (ANTIVERT) 12.5 MG tablet Take 1 tablet (12.5 mg total) by mouth 3 (three) times daily as needed for dizziness or nausea.  60 tablet  1  . naproxen (NAPROSYN) 500 MG tablet Take 1 tablet (500 mg total) by mouth 2 (two) times daily as needed.  60 tablet  3  . omeprazole  (PRILOSEC) 20 MG capsule Take 1 capsule (20 mg total) by mouth daily.  30 capsule  11  . PHENobarbital (LUMINAL) 64.8 MG tablet Take 1 tablet (64.8 mg total) by mouth 2 (two) times daily.  60 tablet  5  . pravastatin (PRAVACHOL) 40 MG tablet Take 1 tablet (40 mg total) by mouth daily.  30 tablet  11   No current facility-administered medications on file prior to visit.    Assessment/Plan

## 2013-02-13 NOTE — Progress Notes (Signed)
Case discussed with Dr. Owens Shark at the time of the visit.  We reviewed the resident's history and exam and pertinent patient test results.  I agree with the assessment, diagnosis and plan of care documented in the resident's note.  His gout may also be precipitated by the HCTZ.  If he continues to have flares despite a uric acid in the 6.0 range we may consider discontinuing the HCTZ.

## 2013-02-13 NOTE — Assessment & Plan Note (Signed)
BP Readings from Last 3 Encounters:  02/13/13 144/91  11/10/12 150/93  10/05/12 156/101    Lab Results  Component Value Date   NA 141 10/07/2011   K 4.6 10/07/2011   CREATININE 1.05 10/07/2011    Assessment: Blood pressure control: controlled Progress toward BP goal:  at goal Comments: BP only mildly elevated, likely secondary to pain from gout flare  Plan: Medications:  continue current medications Educational resources provided: brochure;handout;video Self management tools provided:   Other plans: Recheck at next visit

## 2013-02-13 NOTE — Assessment & Plan Note (Signed)
The patient continues to note constipation, despite Colace. -Continue Colace -And over-the-counter fiber supplement -Prescribed occasional MiraLAX as needed

## 2013-02-13 NOTE — Assessment & Plan Note (Signed)
Flu shot given today

## 2013-02-14 ENCOUNTER — Telehealth: Payer: Self-pay | Admitting: *Deleted

## 2013-02-14 MED ORDER — FLUCONAZOLE 150 MG PO TABS: 150.0000 mg | ORAL_TABLET | ORAL | Status: AC

## 2013-02-14 NOTE — Telephone Encounter (Signed)
Pt's wife calls and states they cannot get a week's worth of this medication, the copay w/ insurance still makes the med cost over 130.00. Can you order something else

## 2013-02-14 NOTE — Telephone Encounter (Signed)
Thank you for this note.  Which medication is this in reference to?

## 2013-02-14 NOTE — Telephone Encounter (Signed)
im sorry, the diflucan

## 2013-02-14 NOTE — Telephone Encounter (Signed)
I checked, and diflucan is on the $4 medication list at [facility] without insurance, though it is $4 for 1 pill.  As such, instead of a 14-day course, we can try 1 tablet q72 hours for 3 doses, so that it will only be $12.  Prescription sent in to [facility].  Instructions sent with prescription to run without insurance, since it seems this will be cheaper than the patient's copay.  Attempted to call patient with this message.  Got answering machine.  Left a message that I had called, but did not leave any pertinent clinical info.  Will attempt to call again tomorrow.

## 2013-02-16 LAB — WOUND CULTURE: Gram Stain: NONE SEEN

## 2013-02-16 NOTE — Telephone Encounter (Signed)
i called and left a message for a return call

## 2013-09-11 ENCOUNTER — Ambulatory Visit (INDEPENDENT_AMBULATORY_CARE_PROVIDER_SITE_OTHER): Payer: Commercial Managed Care - PPO | Admitting: Internal Medicine

## 2013-09-11 ENCOUNTER — Encounter: Payer: Self-pay | Admitting: Internal Medicine

## 2013-09-11 VITALS — BP 135/85 | HR 74 | Temp 97.8°F | Ht 67.0 in | Wt 237.1 lb

## 2013-09-11 DIAGNOSIS — I10 Essential (primary) hypertension: Secondary | ICD-10-CM

## 2013-09-11 DIAGNOSIS — R0989 Other specified symptoms and signs involving the circulatory and respiratory systems: Secondary | ICD-10-CM

## 2013-09-11 DIAGNOSIS — N481 Balanitis: Secondary | ICD-10-CM

## 2013-09-11 DIAGNOSIS — J449 Chronic obstructive pulmonary disease, unspecified: Secondary | ICD-10-CM | POA: Insufficient documentation

## 2013-09-11 DIAGNOSIS — M109 Gout, unspecified: Secondary | ICD-10-CM

## 2013-09-11 DIAGNOSIS — N476 Balanoposthitis: Secondary | ICD-10-CM

## 2013-09-11 DIAGNOSIS — H8110 Benign paroxysmal vertigo, unspecified ear: Secondary | ICD-10-CM

## 2013-09-11 DIAGNOSIS — R0600 Dyspnea, unspecified: Secondary | ICD-10-CM

## 2013-09-11 DIAGNOSIS — R0609 Other forms of dyspnea: Secondary | ICD-10-CM

## 2013-09-11 DIAGNOSIS — K219 Gastro-esophageal reflux disease without esophagitis: Secondary | ICD-10-CM

## 2013-09-11 MED ORDER — CLONIDINE HCL 0.3 MG PO TABS: 0.3000 mg | ORAL_TABLET | Freq: Two times a day (BID) | ORAL | Status: DC

## 2013-09-11 MED ORDER — HYDRALAZINE HCL 25 MG PO TABS: 50.0000 mg | ORAL_TABLET | Freq: Two times a day (BID) | ORAL | Status: DC

## 2013-09-11 MED ORDER — CLOTRIMAZOLE 1 % EX CREA: TOPICAL_CREAM | Freq: Two times a day (BID) | CUTANEOUS | Status: DC

## 2013-09-11 MED ORDER — DOCUSATE SODIUM 100 MG PO CAPS: 100.0000 mg | ORAL_CAPSULE | Freq: Every day | ORAL | Status: DC | PRN

## 2013-09-11 MED ORDER — ALBUTEROL SULFATE HFA 108 (90 BASE) MCG/ACT IN AERS: 2.0000 | INHALATION_SPRAY | Freq: Four times a day (QID) | RESPIRATORY_TRACT | Status: DC | PRN

## 2013-09-11 MED ORDER — COLCHICINE 0.6 MG PO TABS: 0.6000 mg | ORAL_TABLET | Freq: Two times a day (BID) | ORAL | Status: DC | PRN

## 2013-09-11 MED ORDER — OMEPRAZOLE 20 MG PO CPDR: 20.0000 mg | DELAYED_RELEASE_CAPSULE | Freq: Every day | ORAL | Status: DC

## 2013-09-11 MED ORDER — POLYETHYLENE GLYCOL 3350 17 GM/SCOOP PO POWD: 17.0000 g | Freq: Every day | ORAL | Status: DC | PRN

## 2013-09-11 MED ORDER — ALLOPURINOL 100 MG PO TABS: 200.0000 mg | ORAL_TABLET | Freq: Every day | ORAL | Status: DC

## 2013-09-11 MED ORDER — PRAVASTATIN SODIUM 40 MG PO TABS: 40.0000 mg | ORAL_TABLET | Freq: Every day | ORAL | Status: DC

## 2013-09-11 MED ORDER — ENALAPRIL MALEATE 20 MG PO TABS: 20.0000 mg | ORAL_TABLET | Freq: Every day | ORAL | Status: DC

## 2013-09-11 MED ORDER — PHENOBARBITAL 64.8 MG PO TABS: 64.8000 mg | ORAL_TABLET | Freq: Two times a day (BID) | ORAL | Status: DC

## 2013-09-11 MED ORDER — FINASTERIDE 5 MG PO TABS: 5.0000 mg | ORAL_TABLET | Freq: Every day | ORAL | Status: DC

## 2013-09-11 NOTE — Progress Notes (Signed)
HPI The patient is a 68 y.o. male with a history of gout, HTN, BPPV, presenting for a routine follow-up visit for gout.  The patient has a history of gout.  The patient notes 2-3 flares in the last 6 months, last flare about 1 month ago.  He has treated these episodes with Colchicine, which resolves these flares.  He continues to take Allopurinol 200 mg daily.  He notes that his gout flares have decreased in frequency since starting Allopurinol.  The patient has a history of vertigo.  He went to outpatient vestibular rehab, but only went to 1 session (found out each additional session would cost $40).  He has taken Meclizine, without much relief.  He still notes occasional spinning sensations, which occur some days, though not every day.  The patient notes occasional difficulty breathing.  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  He believes he was told he has emphysema at some point in the past.  The patient was given a prescription for albuterol at his last visit, which he used a couple of times per week, and which significantly improved occasional symptoms of dyspnea.  The patient's BP is well-controlled today.  The patient is taking HCTZ as one of his BP medications.  The patient has a history of balanitis, which we have treated with topical antifungals, oral diflucan, and occasional topical corticosteroids.  He has been to [facility] once, but did not return for follow-up.  He notes they discussed circumcision at the time, but that they would try conservative treatments first.  However, the patient notes the area is unchanged, with persistent lesions around his foreskin and glans.  STD testing has been negative.  ROS: General: no fevers, chills, changes in weight, changes in appetite Skin: no rash HEENT: no blurry vision, hearing changes, sore throat Pulm: no dyspnea, coughing, wheezing CV: no chest pain, palpitations,  shortness of breath Abd: no abdominal pain, nausea/vomiting, diarrhea/constipation GU: no dysuria, hematuria, polyuria Ext: see HPI Neuro: no weakness, numbness, or tingling  Filed Vitals:   09/11/13 1521  BP: 135/85  Pulse: 74  Temp: 97.8 F (36.6 C)    PEX General: alert, cooperative, and in no apparent distress HEENT: pupils equal round and reactive to light, vision grossly intact, oropharynx clear and non-erythematous  Neck: supple Lungs: clear to ascultation bilaterally, normal work of respiration, no wheezes, rales, ronchi Heart: regular rate and rhythm, no murmurs, gallops, or rubs Abdomen: soft, non-tender, non-distended, normal bowel sounds Extremities: no cyanosis, clubbing, or edema.  No present joint inflammation, erythema, or warmth Neurologic: alert & oriented X3, cranial nerves II-XII intact, strength grossly intact, sensation intact to light touch  Current Outpatient Prescriptions on File Prior to Visit  Medication Sig Dispense Refill  . albuterol (PROVENTIL HFA;VENTOLIN HFA) 108 (90 BASE) MCG/ACT inhaler Inhale 2 puffs into the lungs every 6 (six) hours as needed for wheezing.  1 Inhaler  3  . allopurinol (ZYLOPRIM) 100 MG tablet Take 2 tablets (200 mg total) by mouth daily.  60 tablet  11  . cloNIDine (CATAPRES) 0.3 MG tablet Take 1 tablet (0.3 mg total) by mouth 2 (two) times daily.  60 tablet  11  . clotrimazole (LOTRIMIN) 1 % cream Apply topically 2 (two) times daily. Apply to lesions on penis  60 g  2  . colchicine 0.6 MG tablet Take 1 tablet (0.6 mg total) by mouth 2 (two) times daily as needed.  Use only during gout flare  60 tablet  11  . docusate sodium (COLACE) 100 MG capsule Take 1-2 capsules (100-200 mg total) by mouth daily as needed for constipation.  60 capsule  5  . enalapril (VASOTEC) 20 MG tablet Take 1 tablet (20 mg total) by mouth daily.  30 tablet  11  . finasteride (PROSCAR) 5 MG tablet Take 1 tablet (5 mg total) by mouth daily.  30 tablet  11   . hydrALAZINE (APRESOLINE) 25 MG tablet Take 2 tablets (50 mg total) by mouth 2 (two) times daily.  120 tablet  11  . hydrochlorothiazide (HYDRODIURIL) 25 MG tablet Take 1 tablet (25 mg total) by mouth daily.  30 tablet  11  . hydrocortisone cream 1 % Apply to penile lesions once per day for 2-4 weeks, then STOP using.  30 g  1  . meclizine (ANTIVERT) 12.5 MG tablet Take 1 tablet (12.5 mg total) by mouth 3 (three) times daily as needed for dizziness or nausea.  60 tablet  1  . naproxen (NAPROSYN) 500 MG tablet Take 1 tablet (500 mg total) by mouth 2 (two) times daily as needed.  60 tablet  3  . omeprazole (PRILOSEC) 20 MG capsule Take 1 capsule (20 mg total) by mouth daily.  30 capsule  11  . PHENobarbital (LUMINAL) 64.8 MG tablet Take 1 tablet (64.8 mg total) by mouth 2 (two) times daily.  60 tablet  5  . polyethylene glycol powder (MIRALAX) powder Take 17 g by mouth daily as needed.  255 g  1  . pravastatin (PRAVACHOL) 40 MG tablet Take 1 tablet (40 mg total) by mouth daily.  30 tablet  11   No current facility-administered medications on file prior to visit.    Assessment/Plan

## 2013-09-11 NOTE — Assessment & Plan Note (Addendum)
The patient notes occasional dyspnea, with improvement with albuterol.  He has a smoking and occupational exposure history, and was reportedly diagnosed with emphysema in the past. -ordered PFT's to check for COPD -refilled prn albuterol  Addendum 5/19:  PFT results show FEV1/FVC of 59%, and FEV1 of 46% (improved to 60% after BD).  The patient has a history of tobacco abuse, smoking about 30 years, slowly building up to 3 packs/day.  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  We will start Advair. -start Advair BID

## 2013-09-11 NOTE — Assessment & Plan Note (Addendum)
The patient continues to note balanitis, which has been present for about the last 1 year. This is thought to represent candida vs mechanical irritation.  Work-up has included negative HIV, GC/chlamydia, UA, and a normal Hemoglobin A1C.  Treatments have included diflucan, lotrimin, clotrimazole, and hydrocortisone. -refer back to Urology for further diagnosis/treatment (?consider circumcision, given extended duration of symptoms)

## 2013-09-11 NOTE — Assessment & Plan Note (Signed)
BP Readings from Last 3 Encounters:  09/11/13 135/85  02/13/13 144/91  11/10/12 150/93    Lab Results  Component Value Date   NA 139 02/13/2013   K 4.6 02/13/2013   CREATININE 0.97 02/13/2013    Assessment: Blood pressure control: controlled Progress toward BP goal:  at goal Comments: Stopping HCTZ.  If BP increases, can increase Enalapril (or change to ARB, given gout history).  Plan: Medications:  Stop HCTZ.  Continue clonidine, enalapril, hydralazine.  Pt to check BP at home with home monitor, and present in 2-4 weeks if elevated, or 3 months if not elevated. Educational resources provided:   Self management tools provided:   Other plans: Check BMET at next visit

## 2013-09-11 NOTE — Assessment & Plan Note (Signed)
The patient has a history of gout.  Last uric acid was 6.2.  After researching the issue with Dr. Eppie Gibson, in addition to sometimes causing medication-induced gout, HCTZ also impairs gout excretion from the kidneys.  As such, we will continue Allopurinol at 200 mg daily, stop HCTZ, and recheck uric acid in 4-6 weeks. -stop HCTZ -continue Allopurinol 200 mg daily -recheck uric acid in 4-6 weeks, or at next visit -continue colchicine prn for acute flares

## 2013-09-11 NOTE — Patient Instructions (Addendum)
General Instructions: For your blood pressure: -STOP taking HCTZ (hydrochlorothiazide), as this medication can worsen gout -check your blood pressure at home.  Your blood pressure goal is under 140/90.  For your balanitis: -we are re-referring you to Urology, to discuss further treatment options  For your vertigo: -we have provided some paperwork regarding home exercises you can do to help with symptoms  For your difficulty breathing: -we are ordering Pulmonary Function Tests, for diagnosis of COPD. -we have refilled your Albuterol inhaler  Please return for a follow-up visit in 3 months.  If your blood pressure is elevated at home, please call for an appointment within the next 2-4 weeks.     Treatment Goals:  Goals (1 Years of Data) as of 09/11/13   None      Progress Toward Treatment Goals:  Treatment Goal 09/11/2013  Blood pressure at goal    Self Care Goals & Plans:  Self Care Goal 02/13/2013  Manage my medications bring my medications to every visit; refill my medications on time; follow the sick day instructions if I am sick  Monitor my health keep track of my blood pressure  Eat healthy foods eat more vegetables; eat fruit for snacks and desserts; eat baked foods instead of fried foods  Be physically active find an activity I enjoy; take a walk every day    No flowsheet data found.   Care Management & Community Referrals:  Referral 09/11/2013  Referrals made for care management support none needed

## 2013-09-11 NOTE — Assessment & Plan Note (Signed)
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  No benefit from meclizine, and unable to afford vestibular rehab. -gave patient information on exercises to try at home to help with otolith repositioning.  Pt cautioned to stop using these techniques if he experiences any neck pain, weakness, numbness, tingling, or other neuro symptoms.

## 2013-09-12 NOTE — Progress Notes (Signed)
Case discussed with Dr. Brown at the time of the visit.  We reviewed the resident's history and exam and pertinent patient test results.  I agree with the assessment, diagnosis and plan of care documented in the resident's note. 

## 2013-09-18 ENCOUNTER — Ambulatory Visit (HOSPITAL_COMMUNITY)
Admission: RE | Admit: 2013-09-18 | Discharge: 2013-09-18 | Disposition: A | Discharge status: Home / Self Care | Payer: Commercial Managed Care - PPO | Source: Ambulatory Visit | Attending: Internal Medicine | Admitting: Internal Medicine

## 2013-09-18 ENCOUNTER — Ambulatory Visit: Payer: Commercial Managed Care - PPO | Admitting: Internal Medicine

## 2013-09-18 DIAGNOSIS — R0600 Dyspnea, unspecified: Secondary | ICD-10-CM

## 2013-09-18 DIAGNOSIS — R0609 Other forms of dyspnea: Secondary | ICD-10-CM | POA: Insufficient documentation

## 2013-09-18 DIAGNOSIS — R0989 Other specified symptoms and signs involving the circulatory and respiratory systems: Principal | ICD-10-CM | POA: Insufficient documentation

## 2013-09-18 MED ORDER — ALBUTEROL SULFATE (2.5 MG/3ML) 0.083% IN NEBU
2.5000 mg | INHALATION_SOLUTION | Freq: Once | RESPIRATORY_TRACT | Status: AC
  Administered 2013-09-18: 2.5 mg via RESPIRATORY_TRACT

## 2013-09-19 ENCOUNTER — Encounter: Payer: Self-pay | Admitting: Internal Medicine

## 2013-09-19 MED ORDER — FLUTICASONE-SALMETEROL 250-50 MCG/DOSE IN AEPB: 1.0000 | INHALATION_SPRAY | Freq: Two times a day (BID) | RESPIRATORY_TRACT | Status: DC

## 2013-09-19 NOTE — Addendum Note (Signed)
Addended by: Hester Mates on: 09/19/2013 10:47 AM   Modules accepted: Orders

## 2013-09-20 MED ORDER — MOMETASONE FURO-FORMOTEROL FUM 100-5 MCG/ACT IN AERO: 2.0000 | INHALATION_SPRAY | Freq: Two times a day (BID) | RESPIRATORY_TRACT | Status: DC

## 2013-09-20 NOTE — Addendum Note (Signed)
Addended by: Hester Mates on: 09/20/2013 01:15 PM   Modules accepted: Orders

## 2013-09-21 LAB — PULMONARY FUNCTION TEST
DL/VA % PRED: 117 %
DL/VA: 5.19 ml/min/mmHg/L
DLCO unc % pred: 88 %
DLCO unc: 25.17 ml/min/mmHg
FEF 25-75 POST: 1.43 L/s
FEF 25-75 Pre: 0.64 L/sec
FEF2575-%CHANGE-POST: 125 %
FEF2575-%PRED-PRE: 28 %
FEF2575-%Pred-Post: 64 %
FEV1-%Change-Post: 28 %
FEV1-%Pred-Post: 60 %
FEV1-%Pred-Pre: 46 %
FEV1-POST: 1.74 L
FEV1-PRE: 1.35 L
FEV1FVC-%CHANGE-POST: 2 %
FEV1FVC-%PRED-PRE: 80 %
FEV6-%Change-Post: 27 %
FEV6-%Pred-Post: 75 %
FEV6-%Pred-Pre: 59 %
FEV6-Post: 2.79 L
FEV6-Pre: 2.2 L
FEV6FVC-%Change-Post: 1 %
FEV6FVC-%Pred-Post: 104 %
FEV6FVC-%Pred-Pre: 102 %
FVC-%CHANGE-POST: 25 %
FVC-%Pred-Post: 72 %
FVC-%Pred-Pre: 57 %
FVC-POST: 2.85 L
FVC-Pre: 2.27 L
POST FEV1/FVC RATIO: 61 %
POST FEV6/FVC RATIO: 98 %
PRE FEV1/FVC RATIO: 59 %
Pre FEV6/FVC Ratio: 97 %
RV % pred: 158 %
RV: 3.61 L
TLC % pred: 100 %
TLC: 6.47 L

## 2013-10-25 ENCOUNTER — Other Ambulatory Visit: Payer: Self-pay | Admitting: Urology

## 2013-11-09 ENCOUNTER — Other Ambulatory Visit (HOSPITAL_COMMUNITY): Payer: Self-pay | Admitting: *Deleted

## 2013-11-10 ENCOUNTER — Other Ambulatory Visit (HOSPITAL_COMMUNITY): Payer: Self-pay | Admitting: *Deleted

## 2013-11-10 ENCOUNTER — Encounter (HOSPITAL_COMMUNITY): Payer: Self-pay | Admitting: Pharmacy Technician

## 2013-11-10 NOTE — Patient Instructions (Addendum)
Ricky Hayden  11/10/2013                           YOUR PROCEDURE IS SCHEDULED ON: 11/20/13 AT 7:30 AM               ENTER [place] ENTRANCE AND                            FOLLOW  SIGNS TO SHORT STAY CENTER                 ARRIVE AT SHORT STAY AT:  5:30 AM               CALL THIS NUMBER IF ANY PROBLEMS THE DAY OF SURGERY :               [phone]                                REMEMBER:   Do not eat food or drink liquids AFTER MIDNIGHT                  Take these medicines the morning of surgery with               A SIPS OF WATER :    Clonidine / hydralazine / phenobarbital / use dulara inhaler / bring albuteral inhaler to hospital     Do not wear jewelry, make-up   Do not wear lotions, powders, or perfumes.   Do not shave legs or underarms 12 hrs. before surgery (men may shave face)  Do not bring valuables to the hospital.  Contacts, dentures or bridgework may not be worn into surgery.  Leave suitcase in the car. After surgery it may be brought to your room.  For patients admitted to the hospital more than one night, checkout time is            11:00 AM                                                       The day of discharge.   Patients discharged the day of surgery will not be allowed to drive home.            If going home same day of surgery, must have someone stay with you              FIRST 24 hrs at home and arrange for some one to drive you              home from hospital.   ________________________________________________________________________                                                                        [place]  Before surgery, you can play an important role.  Because skin is not sterile, your skin needs to be as free  of germs as possible.  You can reduce the number of germs on your skin by washing with CHG (chlorahexidine gluconate) soap before surgery.  CHG is an antiseptic cleaner  which kills germs and bonds with the skin to continue killing germs even after washing. Please DO NOT use if you have an allergy to CHG or antibacterial soaps.  If your skin becomes reddened/irritated stop using the CHG and inform your nurse when you arrive at Short Stay. Do not shave (including legs and underarms) for at least 48 hours prior to the first CHG shower.  You may shave your face. Please follow these instructions carefully:   1.  Shower with CHG Soap the night before surgery and the  morning of Surgery.   2.  If you choose to wash your hair, wash your hair first as usual with your  normal  Shampoo.   3.  After you shampoo, rinse your hair and body thoroughly to remove the  shampoo.                                         4.  Use CHG as you would any other liquid soap.  You can apply chg directly  to the skin and wash . Gently wash with scrungie or clean wascloth    5.  Apply the CHG Soap to your body ONLY FROM THE NECK DOWN.   Do not use on open                           Wound or open sores. Avoid contact with eyes, ears mouth and genitals (private parts).                        Genitals (private parts) with your normal soap.              6.  Wash thoroughly, paying special attention to the area where your surgery  will be performed.   7.  Thoroughly rinse your body with warm water from the neck down.   8.  DO NOT shower/wash with your normal soap after using and rinsing off  the CHG Soap .                9.  Pat yourself dry with a clean towel.             10.  Wear clean pajamas.             11.  Place clean sheets on your bed the night of your first shower and do not  sleep with pets.  Day of Surgery : Do not apply any lotions/deodorants the morning of surgery.  Please wear clean clothes to the hospital/surgery center.  FAILURE TO FOLLOW THESE INSTRUCTIONS MAY RESULT IN THE CANCELLATION OF YOUR SURGERY    PATIENT  SIGNATURE_________________________________  ______________________________________________________________________

## 2013-11-13 ENCOUNTER — Encounter (HOSPITAL_COMMUNITY)
Admission: RE | Admit: 2013-11-13 | Discharge: 2013-11-13 | Disposition: A | Discharge status: Home / Self Care | Payer: Commercial Managed Care - PPO | Source: Ambulatory Visit | Attending: Urology | Admitting: Urology

## 2013-11-13 ENCOUNTER — Ambulatory Visit (HOSPITAL_COMMUNITY)
Admission: RE | Admit: 2013-11-13 | Discharge: 2013-11-13 | Disposition: A | Discharge status: Home / Self Care | Payer: Commercial Managed Care - PPO | Source: Ambulatory Visit | Attending: Anesthesiology | Admitting: Anesthesiology

## 2013-11-13 ENCOUNTER — Encounter (HOSPITAL_COMMUNITY): Payer: Self-pay

## 2013-11-13 DIAGNOSIS — J449 Chronic obstructive pulmonary disease, unspecified: Secondary | ICD-10-CM | POA: Insufficient documentation

## 2013-11-13 DIAGNOSIS — Z01810 Encounter for preprocedural cardiovascular examination: Secondary | ICD-10-CM | POA: Insufficient documentation

## 2013-11-13 DIAGNOSIS — R0602 Shortness of breath: Secondary | ICD-10-CM | POA: Insufficient documentation

## 2013-11-13 DIAGNOSIS — Z01818 Encounter for other preprocedural examination: Secondary | ICD-10-CM | POA: Insufficient documentation

## 2013-11-13 DIAGNOSIS — Z01812 Encounter for preprocedural laboratory examination: Secondary | ICD-10-CM | POA: Insufficient documentation

## 2013-11-13 DIAGNOSIS — J4489 Other specified chronic obstructive pulmonary disease: Secondary | ICD-10-CM | POA: Insufficient documentation

## 2013-11-13 HISTORY — DX: Benign prostatic hyperplasia without lower urinary tract symptoms: N40.0

## 2013-11-13 HISTORY — DX: Balanitis: N48.1

## 2013-11-13 HISTORY — DX: Nocturia: R35.1

## 2013-11-13 HISTORY — DX: Other specified postprocedural states: Z98.890

## 2013-11-13 HISTORY — DX: Dizziness and giddiness: R42

## 2013-11-13 HISTORY — DX: Chronic obstructive pulmonary disease, unspecified: J44.9

## 2013-11-13 HISTORY — DX: Other specified postprocedural states: R11.2

## 2013-11-13 LAB — CBC
HEMATOCRIT: 45.8 % (ref 39.0–52.0)
Hemoglobin: 15.6 g/dL (ref 13.0–17.0)
MCH: 30.1 pg (ref 26.0–34.0)
MCHC: 34.1 g/dL (ref 30.0–36.0)
MCV: 88.2 fL (ref 78.0–100.0)
Platelets: 150 10*3/uL (ref 150–400)
RBC: 5.19 MIL/uL (ref 4.22–5.81)
RDW: 12.6 % (ref 11.5–15.5)
WBC: 6.8 10*3/uL (ref 4.0–10.5)

## 2013-11-13 LAB — BASIC METABOLIC PANEL
Anion gap: 9 (ref 5–15)
BUN: 11 mg/dL (ref 6–23)
CALCIUM: 9.8 mg/dL (ref 8.4–10.5)
CO2: 30 meq/L (ref 19–32)
Chloride: 104 mEq/L (ref 96–112)
Creatinine, Ser: 0.91 mg/dL (ref 0.50–1.35)
GFR calc Af Amer: >90 mL/min (ref >90)
GFR calc non Af Amer: 84 mL/min — ABNORMAL LOW (ref >90)
Glucose, Bld: 87 mg/dL (ref 70–99)
Potassium: 4.4 mEq/L (ref 3.7–5.3)
Sodium: 143 mEq/L (ref 137–147)

## 2013-11-13 NOTE — Progress Notes (Signed)
11/13/13 1408  OBSTRUCTIVE SLEEP APNEA  Have you ever been diagnosed with sleep apnea through a sleep study? No  Do you snore loudly (loud enough to be heard through closed doors)?  1  Do you often feel tired, fatigued, or sleepy during the daytime? 1  Has anyone observed you stop breathing during your sleep? 1  Do you have, or are you being treated for high blood pressure? 1  BMI more than 35 kg/m2? 1  Age over 70 years old? 1  Neck circumference greater than 40 cm/16 inches? 1  Gender: 1  Obstructive Sleep Apnea Score 8  Score 4 or greater  Results sent to PCP

## 2013-11-13 NOTE — Progress Notes (Signed)
EKG reviewed by Dr. Kalman Shan - no action needed

## 2013-11-14 ENCOUNTER — Encounter (INDEPENDENT_AMBULATORY_CARE_PROVIDER_SITE_OTHER): Payer: Self-pay | Admitting: Internal Medicine

## 2013-11-20 ENCOUNTER — Encounter (HOSPITAL_COMMUNITY): Payer: Commercial Managed Care - PPO | Admitting: Anesthesiology

## 2013-11-20 ENCOUNTER — Ambulatory Visit (HOSPITAL_COMMUNITY): Payer: Commercial Managed Care - PPO | Admitting: Anesthesiology

## 2013-11-20 ENCOUNTER — Ambulatory Visit (HOSPITAL_COMMUNITY)
Admission: RE | Admit: 2013-11-20 | Discharge: 2013-11-20 | Disposition: A | Discharge status: Home / Self Care | Payer: Commercial Managed Care - PPO | Source: Ambulatory Visit | Attending: Urology | Admitting: Urology

## 2013-11-20 ENCOUNTER — Encounter (HOSPITAL_COMMUNITY)
Admission: RE | Disposition: A | Discharge status: Home / Self Care | Payer: Self-pay | Source: Ambulatory Visit | Attending: Urology

## 2013-11-20 ENCOUNTER — Encounter (HOSPITAL_COMMUNITY): Payer: Self-pay | Admitting: Anesthesiology

## 2013-11-20 DIAGNOSIS — E669 Obesity, unspecified: Secondary | ICD-10-CM | POA: Insufficient documentation

## 2013-11-20 DIAGNOSIS — N476 Balanoposthitis: Secondary | ICD-10-CM | POA: Insufficient documentation

## 2013-11-20 DIAGNOSIS — Z87438 Personal history of other diseases of male genital organs: Secondary | ICD-10-CM

## 2013-11-20 DIAGNOSIS — Z79899 Other long term (current) drug therapy: Secondary | ICD-10-CM | POA: Insufficient documentation

## 2013-11-20 DIAGNOSIS — K219 Gastro-esophageal reflux disease without esophagitis: Secondary | ICD-10-CM | POA: Insufficient documentation

## 2013-11-20 DIAGNOSIS — N471 Phimosis: Secondary | ICD-10-CM | POA: Insufficient documentation

## 2013-11-20 DIAGNOSIS — Z87891 Personal history of nicotine dependence: Secondary | ICD-10-CM | POA: Insufficient documentation

## 2013-11-20 DIAGNOSIS — K449 Diaphragmatic hernia without obstruction or gangrene: Secondary | ICD-10-CM | POA: Insufficient documentation

## 2013-11-20 DIAGNOSIS — Z6834 Body mass index (BMI) 34.0-34.9, adult: Secondary | ICD-10-CM | POA: Insufficient documentation

## 2013-11-20 DIAGNOSIS — J4489 Other specified chronic obstructive pulmonary disease: Secondary | ICD-10-CM | POA: Insufficient documentation

## 2013-11-20 DIAGNOSIS — N478 Other disorders of prepuce: Secondary | ICD-10-CM | POA: Insufficient documentation

## 2013-11-20 DIAGNOSIS — M109 Gout, unspecified: Secondary | ICD-10-CM | POA: Insufficient documentation

## 2013-11-20 DIAGNOSIS — I10 Essential (primary) hypertension: Secondary | ICD-10-CM | POA: Insufficient documentation

## 2013-11-20 DIAGNOSIS — I252 Old myocardial infarction: Secondary | ICD-10-CM | POA: Insufficient documentation

## 2013-11-20 DIAGNOSIS — J449 Chronic obstructive pulmonary disease, unspecified: Secondary | ICD-10-CM | POA: Insufficient documentation

## 2013-11-20 DIAGNOSIS — I2510 Atherosclerotic heart disease of native coronary artery without angina pectoris: Secondary | ICD-10-CM | POA: Insufficient documentation

## 2013-11-20 HISTORY — PX: CIRCUMCISION: SHX1350

## 2013-11-20 SURGERY — CIRCUMCISION, ADULT
Anesthesia: General

## 2013-11-20 MED ORDER — LACTATED RINGERS IV SOLN
INTRAVENOUS | Status: DC | PRN
  Administered 2013-11-20: 07:00:00 via INTRAVENOUS

## 2013-11-20 MED ORDER — BUPIVACAINE HCL (PF) 0.25 % IJ SOLN
INTRAMUSCULAR | Status: AC
  Filled 2013-11-20: qty 30

## 2013-11-20 MED ORDER — LIDOCAINE HCL (CARDIAC) 20 MG/ML IV SOLN
INTRAVENOUS | Status: AC
  Filled 2013-11-20: qty 5

## 2013-11-20 MED ORDER — PROPOFOL 10 MG/ML IV BOLUS
INTRAVENOUS | Status: AC
  Filled 2013-11-20: qty 20

## 2013-11-20 MED ORDER — LIDOCAINE HCL (CARDIAC) 20 MG/ML IV SOLN
INTRAVENOUS | Status: DC | PRN
  Administered 2013-11-20: 50 mg via INTRAVENOUS

## 2013-11-20 MED ORDER — ONDANSETRON HCL 4 MG/2ML IJ SOLN
INTRAMUSCULAR | Status: AC
  Filled 2013-11-20: qty 2

## 2013-11-20 MED ORDER — BACITRACIN ZINC 500 UNIT/GM EX OINT
TOPICAL_OINTMENT | CUTANEOUS | Status: DC | PRN
  Administered 2013-11-20: 1 via TOPICAL

## 2013-11-20 MED ORDER — FENTANYL CITRATE 0.05 MG/ML IJ SOLN: 25.0000 ug | INTRAMUSCULAR | Status: DC | PRN

## 2013-11-20 MED ORDER — BUPIVACAINE HCL (PF) 0.25 % IJ SOLN
INTRAMUSCULAR | Status: DC | PRN
  Administered 2013-11-20: 10 mL

## 2013-11-20 MED ORDER — OXYCODONE-ACETAMINOPHEN 10-325 MG PO TABS: 1.0000 | ORAL_TABLET | ORAL | Status: DC | PRN

## 2013-11-20 MED ORDER — OXYCODONE-ACETAMINOPHEN 5-325 MG PO TABS
1.0000 | ORAL_TABLET | ORAL | Status: DC | PRN
  Administered 2013-11-20: 1 via ORAL
  Filled 2013-11-20: qty 1

## 2013-11-20 MED ORDER — BACITRACIN ZINC 500 UNIT/GM EX OINT
TOPICAL_OINTMENT | CUTANEOUS | Status: AC
  Filled 2013-11-20: qty 28.35

## 2013-11-20 MED ORDER — PROMETHAZINE HCL 25 MG/ML IJ SOLN: 6.2500 mg | INTRAMUSCULAR | Status: DC | PRN

## 2013-11-20 MED ORDER — PHENYLEPHRINE HCL 10 MG/ML IJ SOLN
INTRAMUSCULAR | Status: DC | PRN
  Administered 2013-11-20 (×2): 40 ug via INTRAVENOUS
  Administered 2013-11-20: 80 ug via INTRAVENOUS
  Administered 2013-11-20 (×3): 40 ug via INTRAVENOUS

## 2013-11-20 MED ORDER — CEFAZOLIN SODIUM-DEXTROSE 2-3 GM-% IV SOLR
INTRAVENOUS | Status: AC
  Filled 2013-11-20: qty 50

## 2013-11-20 MED ORDER — ONDANSETRON HCL 4 MG/2ML IJ SOLN
INTRAMUSCULAR | Status: DC | PRN
  Administered 2013-11-20: 4 mg via INTRAVENOUS

## 2013-11-20 MED ORDER — PROPOFOL 10 MG/ML IV BOLUS
INTRAVENOUS | Status: DC | PRN
  Administered 2013-11-20: 200 mg via INTRAVENOUS

## 2013-11-20 MED ORDER — CEFAZOLIN SODIUM-DEXTROSE 2-3 GM-% IV SOLR
2.0000 g | INTRAVENOUS | Status: AC
  Administered 2013-11-20: 2 g via INTRAVENOUS

## 2013-11-20 MED ORDER — FENTANYL CITRATE 0.05 MG/ML IJ SOLN
INTRAMUSCULAR | Status: DC | PRN
  Administered 2013-11-20 (×2): 25 ug via INTRAVENOUS

## 2013-11-20 MED ORDER — FENTANYL CITRATE 0.05 MG/ML IJ SOLN
INTRAMUSCULAR | Status: AC
  Filled 2013-11-20: qty 5

## 2013-11-20 MED ORDER — PHENYLEPHRINE 40 MCG/ML (10ML) SYRINGE FOR IV PUSH (FOR BLOOD PRESSURE SUPPORT)
PREFILLED_SYRINGE | INTRAVENOUS | Status: AC
  Filled 2013-11-20: qty 10

## 2013-11-20 MED ORDER — FENTANYL CITRATE 0.05 MG/ML IJ SOLN
INTRAMUSCULAR | Status: AC
  Filled 2013-11-20: qty 2

## 2013-11-20 MED ORDER — LACTATED RINGERS IV SOLN
INTRAVENOUS | Status: DC
  Administered 2013-11-20: 11:00:00 via INTRAVENOUS

## 2013-11-20 SURGICAL SUPPLY — 22 items
BLADE SURG 15 STRL LF DISP TIS (BLADE) ×1 IMPLANT
BLADE SURG 15 STRL SS (BLADE) ×2
BNDG COHESIVE 3X5 TAN STRL LF (GAUZE/BANDAGES/DRESSINGS) ×2 IMPLANT
COVER SURGICAL LIGHT HANDLE (MISCELLANEOUS) ×1 IMPLANT
DECANTER SPIKE VIAL GLASS SM (MISCELLANEOUS) IMPLANT
DRAPE LAPAROTOMY T 102X78X121 (DRAPES) ×2 IMPLANT
ELECT REM PT RETURN 9FT ADLT (ELECTROSURGICAL) ×2
ELECTRODE REM PT RTRN 9FT ADLT (ELECTROSURGICAL) ×1 IMPLANT
GAUZE SPONGE 4X4 12PLY STRL (GAUZE/BANDAGES/DRESSINGS) ×2 IMPLANT
GAUZE SPONGE 4X4 16PLY XRAY LF (GAUZE/BANDAGES/DRESSINGS) ×2 IMPLANT
GLOVE BIO SURGEON STRL SZ8 (GLOVE) ×8 IMPLANT
GOWN STRL REUS W/TWL XL LVL3 (GOWN DISPOSABLE) ×4 IMPLANT
KIT BASIN OR (CUSTOM PROCEDURE TRAY) ×2 IMPLANT
NDL HYPO 25X1 1.5 SAFETY (NEEDLE) IMPLANT
NEEDLE HYPO 25X1 1.5 SAFETY (NEEDLE) IMPLANT
NS IRRIG 1000ML POUR BTL (IV SOLUTION) ×2 IMPLANT
PACK BASIC VI WITH GOWN DISP (CUSTOM PROCEDURE TRAY) ×2 IMPLANT
PENCIL BUTTON HOLSTER BLD 10FT (ELECTRODE) ×2 IMPLANT
SUT CHROMIC 3 0 SH 27 (SUTURE) ×4 IMPLANT
SUT CHROMIC 4 0 SH 27 (SUTURE) ×2 IMPLANT
SYR CONTROL 10ML LL (SYRINGE) IMPLANT
WATER STERILE IRR 1500ML POUR (IV SOLUTION) IMPLANT

## 2013-11-20 NOTE — Progress Notes (Signed)
Patient given ice pack for home use

## 2013-11-20 NOTE — Anesthesia Postprocedure Evaluation (Signed)
  Anesthesia Post-op Note  Patient: Ricky Hayden  Procedure(s) Performed: Procedure(s) (LRB): CIRCUMCISION ADULT (N/A)  Patient Location: PACU  Anesthesia Type: General  Level of Consciousness: awake and alert   Airway and Oxygen Therapy: Patient Spontanous Breathing  Post-op Pain: mild  Post-op Assessment: Post-op Vital signs reviewed, Patient's Cardiovascular Status Stable, Respiratory Function Stable, Patent Airway and No signs of Nausea or vomiting  Last Vitals:  Filed Vitals:   11/20/13 1242  BP: 140/77  Pulse: 65  Temp:   Resp: 18    Post-op Vital Signs: stable   Complications: No apparent anesthesia complications

## 2013-11-20 NOTE — Discharge Instructions (Signed)
Postoperative instructions for circumcision ° °Wound: ° °In most cases your incision will have absorbable sutures that run along the course of your incision and will dissolve within the first 10-20 days. Some will fall out even earlier. Expect some redness as the sutures dissolved but this should occur only around the sutures. If there is generalized redness, especially with increasing pain or swelling, let us know. The penis will very likely get "black and blue" as the blood in the tissues spread. Sometimes the whole penis will turn colors. The black and blue is followed by a yellow and brown color. In time, all the discoloration will go away. ° °Diet: ° °You may return to your normal diet within 24 hours following your surgery. You may note some mild nausea and possibly vomiting the first 6-8 hours following surgery. This is usually due to the side effects of anesthesia, and will disappear quite soon. I would suggest clear liquids and a very light meal the first evening following your surgery. ° °Activity: ° °Your physical activity should be restricted the first 48 hours. During that time you should remain relatively inactive, moving about only when necessary. During the first 7-10 days following surgery he should avoid lifting any heavy objects (anything greater than 15 pounds), and avoid strenuous exercise. If you work, ask us specifically about your restrictions, both for work and home. We will write a note to your employer if needed. ° °Ice packs can be placed on and off over the penis for the first 48 hours to help relieve the pain and keep the swelling down. Frozen peas or corn in a ZipLock bag can be frozen, used and re-frozen. Fifteen minutes on and 15 minutes off is a reasonable schedule.  ° °Hygiene: ° °You may shower 48 hours after your surgery. Tub bathing should be restricted until the seventh day. ° °Medication: ° °You will be sent home with some type of pain medication. In many cases you will be  sent home with a narcotic pain pill (Vicodin or Tylox). If the pain is not too bad, you may take either Tylenol (acetaminophen) or Advil (ibuprofen) which contain no narcotic agents, and might be tolerated a little better, with fewer side effects. If the pain medication you are sent home with does not control the pain, you will have to let us know. Some narcotic pain medications cannot be given or refilled by a phone call to a pharmacy. ° °Problems you should report to us: ° °· Fever of 101.0 degrees Fahrenheit or greater. °· Moderate or severe swelling under the skin incision or involving the scrotum. °· Drug reaction such as hives, a rash, nausea or vomiting.  ° ° °

## 2013-11-20 NOTE — Op Note (Signed)
PATIENT:  Ricky Hayden  PRE-OPERATIVE DIAGNOSIS: 1. Recurrent balanitis 2. Redundant foreskin  POST-OPERATIVE DIAGNOSIS: 1. Recurrent balanitis 2. Redundant foreskin 3. Tethered frenulum  PROCEDURE: 1. Circumcision 2. Release and repair of tethered frenulum  SURGEON:  Ricky Hayden  INDICATION: Ricky Hayden is a 68 year old male was initially seen for balanitis in 7/14. He noted improvement with antifungal therapy however he has continued to have difficulty with recurrent fungal infections. He was noted on exam to have some depigmentation of the inner portion of the foreskin. This involved the frenula region as well.  ANESTHESIA:  General  EBL:  Minimal  DRAINS: None  LOCAL MEDICATIONS USED:  10 cc of 1/4% Marcaine  SPECIMEN:    Description of procedure: After informed consent the patient was taken to the operating room and placed on the table in a supine position. General anesthesia was then administered. Once fully anesthetized the genitalia were sterilely prepped and draped in standard fashion. An official timeout was then performed.  I first retracted the foreskin and examined the penis noting significant foreshortening of the frenula region resulting in marked tethering relative to the remainder of his penile shaft skin.  A dorsal penile block was then performed in the standard fashion using 10 cc of plain, 1/4% Marcaine.  I first made an incision at the level of the frenulum to release this. In doing so this allowed the skin to drop away but there was still some redundant skin in the frenula region which was excised. I then marked an area approximately 5 mm from the glans circumferentially and incised along this Ricky Hayden. I then replaced the foreskin into its normal anatomic position and marked the shaft skin at the level of the corona which happened to be just distal to the area of skin depigmentation. I took care to assess the amount of shaft skin that would be present if he  would potentially lose weight so that I would not excise too much shaft skin. I then incised along this Ricky Hayden and then excised the frenular skin. Bleeding points were cauterized with electrocautery.  I first reconstructed the glans and frenula region by reapproximating the edges with 4-0 chromic in an interrupted fashion. I then reapproximated the shaft skin at the 6:00 and 12:00 positions. I used a U stitch at the 6:00 position and then reapproximated the skin in a running fashion on each side. This resulted in an excellent cosmetic result. I applied triple antibiotic ointment to the incisions and gently wrapped folded 4 x 4's followed by a gentle wrap of Kling and then Coban. The patient was then awakened and taken to the recovery room in stable and satisfactory condition. He tolerated the procedure well with no intraoperative complications.  PLAN OF CARE: Discharge to home after PACU  PATIENT DISPOSITION:  PACU - hemodynamically stable.

## 2013-11-20 NOTE — H&P (Signed)
[name] is a 68 year old nondiabetic male with a history of balanitis.   History of Present Illness Balanitis: He was seen in 7/14 for balanitis initially do to a fungal infection. A urine culture, GC and Chlamydia were found to be negative. He was treated with Diflucan and initially reported some slight improvement but this did not result in resolution. He was found to have normal hemoglobin A1c. Treatment with Diflucan resulted in complete resolution. He was found to have no evidence of phimosis.     Organic erectile dysfunction. He says he is able to still achieve an erection sometimes but has been augmenting his incomplete erections with Viagra 50 mg.    BPH: This has been managed with finasteride.    Interval history: Over the past year he has had balanitis that has required treatment with oral antifungals as well as topical antifungals and topical steroids. When he does have his balanitis he says he has discomfort and redness of the head of the penis. He is not having any discomfort currently. He said he has been dealing with this for a year now and would like to get it taken care of.   Past Medical History Problems  1. History of Acute Myocardial Infarction (V12.59) 2. 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 History of Gout (274.9) 4. History of hiatal hernia (V12.79) 5. History of hyperlipidemia (V12.29) 6. History of hypertension (V12.59) 7. History of sleep apnea (V13.89) 8. History of Osteoarthritis (V13.4)  Surgical History Problems  1. History of Brain Surgery 2. History of Shoulder Surgery Left 3. History of Sinus Surgery  Current Meds 1. Albuterol Sulfate (2.5 MG/3ML) 0.083% Inhalation Nebulization Solution;  Therapy: (Recorded:23Jul2014) to Recorded 2. Allopurinol 100 MG Oral Tablet;  Therapy: (Recorded:23Jul2014) to Recorded 3. CloNIDine HCl - 0.3 MG Oral Tablet;  Therapy: (Recorded:23Jul2014) to Recorded 4. Enalapril Maleate 20 MG Oral  Tablet;  Therapy: (Recorded:23Jul2014) to Recorded 5. Finasteride 5 MG Oral Tablet;  Therapy: (Recorded:23Jul2014) to Recorded 6. Fluconazole 100 MG Oral Tablet;  Therapy: (Recorded:23Jul2014) to Recorded 7. HydrALAZINE HCl - 25 MG Oral Tablet;  Therapy: (Recorded:23Jul2014) to Recorded 8. Hydrochlorothiazide 25 MG Oral Tablet;  Therapy: (Recorded:23Jul2014) to Recorded 9. Meclizine HCl - 12.5 MG Oral Tablet;  Therapy: (Recorded:23Jul2014) to Recorded 10. Naproxen 500 MG Oral Tablet;   Therapy: (Recorded:23Jul2014) to Recorded 11. Omeprazole 20 MG Oral Capsule Delayed Release;   Therapy: (Recorded:23Jul2014) to Recorded 12. PHENobarbital 64.8 MG Oral Tablet;   Therapy: (Recorded:23Jul2014) to Recorded 13. Pravastatin Sodium 40 MG Oral Tablet;   Therapy: (Recorded:23Jul2014) to Recorded  Allergies Medication  1. Calcium Channel Blockers  Family History Problems  1. Family history of Cancer 2. Family history of Diabetes Mellitus (V18.0) 3. Family history of Heart Disease (V17.49) 4. Family history of Tuberculosis  Social History Problems  1. Alcohol Use 2. Caffeine Use 3. Former smoker [place])   smoked for 42 years quit 1998 4. Marital History - Currently Married 5. Occupation:   diable  Review of Systems Genitourinary, constitutional, skin, eye, otolaryngeal, hematologic/lymphatic, cardiovascular, pulmonary, endocrine, musculoskeletal, gastrointestinal, neurological and psychiatric system(s) were reviewed and pertinent findings if present are noted.  Genitourinary: urinary frequency, feelings of urinary urgency, nocturia, erectile dysfunction and penile pain.  Gastrointestinal: heartburn and constipation.  Constitutional: feeling tired (fatigue).  Integumentary: skin rash/lesion and pruritus.  ENT: sinus problems.  Respiratory: shortness of breath.  Musculoskeletal: back pain and joint pain.  Neurological: dizziness.    Vitals Blood Pressure: 160 / 96 Heart  Rate: 74 24Jul2014 09:36AM  BMI Calculated: 35.79 BSA Calculated: 2.14 Height: 5 ft 7 in Weight: 228 lb  Temperature: 97.5 F  Physical Exam Constitutional: Well nourished and well developed . No acute distress.  ENT:. The ears and nose are normal in appearance.  Neck: The appearance of the neck is normal and no neck mass is present.  Pulmonary: No respiratory distress and normal respiratory rhythm and effort.  Cardiovascular: Heart rate and rhythm are normal . No peripheral edema.  Abdomen: The abdomen is soft and nontender. No masses are palpated. No CVA tenderness. No hernias are palpable. No hepatosplenomegaly noted.  Genitourinary: Examination of the penis demonstrates no discharge, no masses, no lesions and a normal meatus. The penis is uncircumcised. The scrotum is without lesions. Examination of the left scrotum demostrates a hydrocele. The right epididymis is palpably normal and non-tender. The left epididymis is palpably normal and non-tender. The right testis is non-tender and without masses. The left testis is non-tender and without masses. There were no areas of erythema or worrisome lesions and the foreskin however there was an area of hypopigmentation involving the frenula region.  Lymphatics: The femoral and inguinal nodes are not enlarged or tender.  Skin: Normal skin turgor, no visible rash and no visible skin lesions.  Neuro/Psych:. Mood and affect are appropriate.    Assessment When I initially saw him he only had 1 episode of balanitis and we discussed circumcision as a treatment option at that time but I did not recommend that the cause he had only had a single episode. Since that time he has had further recurrences and therefore we discussed proceeding with circumcision as a definitive therapy to prevent further recurrences. I also have discussed with him the fact that due to his increased lower abdominal adipose tissue this has caused his shaft skin to also be pushed down  over the head of the penis. After his circumcision he may still have a small amount of skin covering a portion of the glans due to this fact. I have gone over the surgery with him in detail including the incision used, the risks and complications, the probability of success as well as the anticipated postoperative course. I have answered all of his questions regarding the surgery and he has elected to proceed.   Plan  He will be scheduled for outpatient circumcision.

## 2013-11-20 NOTE — Transfer of Care (Signed)
Immediate Anesthesia Transfer of Care Note  Patient: Ricky Hayden  Procedure(s) Performed: Procedure(s) (LRB): CIRCUMCISION ADULT (N/A)  Patient Location: PACU  Anesthesia Type: General  Level of Consciousness: sedated, patient cooperative and responds to stimulation  Airway & Oxygen Therapy: Patient Spontanous Breathing and Patient connected to face mask oxgen  Post-op Assessment: Report given to PACU RN and Post -op Vital signs reviewed and stable  Post vital signs: Reviewed and stable  Complications: No apparent anesthesia complications

## 2013-11-20 NOTE — Anesthesia Preprocedure Evaluation (Addendum)
Anesthesia Evaluation  Patient identified by MRN, date of birth, ID band Patient awake    Reviewed: Allergy & Precautions, H&P , NPO status , Patient's Chart, lab work & pertinent test results  History of Anesthesia Complications (+) PONV and history of anesthetic complications  Airway Mallampati: II TM Distance: >3 FB Neck ROM: Full    Dental no notable dental hx. (+) Loose,    Pulmonary COPD COPD inhaler, former smoker,  breath sounds clear to auscultation  Pulmonary exam normal       Cardiovascular hypertension, Pt. on medications + CAD + dysrhythmias Rhythm:Regular Rate:Normal     Neuro/Psych  Neuromuscular disease negative psych ROS   GI/Hepatic Neg liver ROS, hiatal hernia, GERD-  ,  Endo/Other  negative endocrine ROS  Renal/GU negative Renal ROS  negative genitourinary   Musculoskeletal negative musculoskeletal ROS (+)   Abdominal (+) + obese,   Peds negative pediatric ROS (+)  Hematology negative hematology ROS (+)   Anesthesia Other Findings   Reproductive/Obstetrics negative OB ROS                        Anesthesia Physical Anesthesia Plan  ASA: III  Anesthesia Plan: General   Post-op Pain Management:    Induction: Intravenous  Airway Management Planned: LMA  Additional Equipment:   Intra-op Plan:   Post-operative Plan: Extubation in OR  Informed Consent: I have reviewed the patients History and Physical, chart, labs and discussed the procedure including the risks, benefits and alternatives for the proposed anesthesia with the patient or authorized representative who has indicated his/her understanding and acceptance.   Dental advisory given  Plan Discussed with: CRNA  Anesthesia Plan Comments:         Anesthesia Quick Evaluation

## 2013-11-21 ENCOUNTER — Encounter (HOSPITAL_COMMUNITY): Payer: Self-pay | Admitting: Urology

## 2014-03-05 ENCOUNTER — Ambulatory Visit (HOSPITAL_COMMUNITY)
Admission: RE | Admit: 2014-03-05 | Discharge: 2014-03-05 | Disposition: A | Discharge status: Home / Self Care | Payer: Commercial Managed Care - PPO | Source: Ambulatory Visit | Attending: Internal Medicine | Admitting: Internal Medicine

## 2014-03-05 ENCOUNTER — Ambulatory Visit (INDEPENDENT_AMBULATORY_CARE_PROVIDER_SITE_OTHER): Payer: Commercial Managed Care - PPO | Admitting: Internal Medicine

## 2014-03-05 ENCOUNTER — Telehealth: Payer: Self-pay | Admitting: Internal Medicine

## 2014-03-05 ENCOUNTER — Encounter: Payer: Self-pay | Admitting: Internal Medicine

## 2014-03-05 VITALS — BP 110/68 | HR 86 | Temp 97.8°F | Ht 67.0 in | Wt 237.5 lb

## 2014-03-05 DIAGNOSIS — R109 Unspecified abdominal pain: Secondary | ICD-10-CM | POA: Insufficient documentation

## 2014-03-05 DIAGNOSIS — R1084 Generalized abdominal pain: Secondary | ICD-10-CM

## 2014-03-05 DIAGNOSIS — K219 Gastro-esophageal reflux disease without esophagitis: Secondary | ICD-10-CM

## 2014-03-05 DIAGNOSIS — R1110 Vomiting, unspecified: Secondary | ICD-10-CM | POA: Diagnosis not present

## 2014-03-05 DIAGNOSIS — I10 Essential (primary) hypertension: Secondary | ICD-10-CM

## 2014-03-05 DIAGNOSIS — N179 Acute kidney failure, unspecified: Secondary | ICD-10-CM

## 2014-03-05 LAB — CBC WITH DIFFERENTIAL/PLATELET
BASOS ABS: 0 10*3/uL (ref 0.0–0.1)
BASOS PCT: 0 % (ref 0–1)
EOS PCT: 1 % (ref 0–5)
Eosinophils Absolute: 0.1 10*3/uL (ref 0.0–0.7)
HEMATOCRIT: 50.7 % (ref 39.0–52.0)
Hemoglobin: 17.1 g/dL — ABNORMAL HIGH (ref 13.0–17.0)
Lymphocytes Relative: 14 % (ref 12–46)
Lymphs Abs: 1.6 10*3/uL (ref 0.7–4.0)
MCH: 30.7 pg (ref 26.0–34.0)
MCHC: 33.7 g/dL (ref 30.0–36.0)
MCV: 91 fL (ref 78.0–100.0)
MONO ABS: 0.9 10*3/uL (ref 0.1–1.0)
Monocytes Relative: 8 % (ref 3–12)
Neutro Abs: 8.9 10*3/uL — ABNORMAL HIGH (ref 1.7–7.7)
Neutrophils Relative %: 77 % (ref 43–77)
PLATELETS: 179 10*3/uL (ref 150–400)
RBC: 5.57 MIL/uL (ref 4.22–5.81)
RDW: 13.2 % (ref 11.5–15.5)
WBC: 11.6 10*3/uL — AB (ref 4.0–10.5)

## 2014-03-05 LAB — COMPLETE METABOLIC PANEL WITH GFR
ALBUMIN: 4.1 g/dL (ref 3.5–5.2)
ALK PHOS: 77 U/L (ref 39–117)
ALT: 26 U/L (ref 0–53)
AST: 21 U/L (ref 0–37)
BUN: 20 mg/dL (ref 6–23)
CALCIUM: 10 mg/dL (ref 8.4–10.5)
CHLORIDE: 100 meq/L (ref 96–112)
CO2: 31 mEq/L (ref 19–32)
Creat: 1.44 mg/dL — ABNORMAL HIGH (ref 0.50–1.35)
GFR, Est African American: 56 mL/min — ABNORMAL LOW
GFR, Est Non African American: 49 mL/min — ABNORMAL LOW
Glucose, Bld: 138 mg/dL — ABNORMAL HIGH (ref 70–99)
POTASSIUM: 4.8 meq/L (ref 3.5–5.3)
SODIUM: 144 meq/L (ref 135–145)
TOTAL PROTEIN: 7.7 g/dL (ref 6.0–8.3)
Total Bilirubin: 0.4 mg/dL (ref 0.3–1.2)

## 2014-03-05 LAB — PHENOBARBITAL LEVEL: Phenobarbital: 10.4 — ABNORMAL LOW (ref 15.0–40.0)

## 2014-03-05 LAB — LIPASE: Lipase: 33 U/L (ref 11–59)

## 2014-03-05 MED ORDER — ENALAPRIL MALEATE 20 MG PO TABS: 20.0000 mg | ORAL_TABLET | Freq: Every evening | ORAL | Status: DC

## 2014-03-05 MED ORDER — ALLOPURINOL 100 MG PO TABS: 200.0000 mg | ORAL_TABLET | Freq: Every day | ORAL | Status: DC

## 2014-03-05 MED ORDER — COLCHICINE 0.6 MG PO TABS: 0.6000 mg | ORAL_TABLET | Freq: Two times a day (BID) | ORAL | Status: DC | PRN

## 2014-03-05 MED ORDER — OMEPRAZOLE 20 MG PO CPDR: 20.0000 mg | DELAYED_RELEASE_CAPSULE | Freq: Every day | ORAL | Status: DC

## 2014-03-05 MED ORDER — FINASTERIDE 5 MG PO TABS
5.0000 mg | ORAL_TABLET | Freq: Every day | ORAL | Status: DC
Start: 2014-03-05 — End: 2014-09-25

## 2014-03-05 MED ORDER — HYDRALAZINE HCL 25 MG PO TABS: 50.0000 mg | ORAL_TABLET | Freq: Two times a day (BID) | ORAL | Status: DC

## 2014-03-05 MED ORDER — MOMETASONE FURO-FORMOTEROL FUM 100-5 MCG/ACT IN AERO: 2.0000 | INHALATION_SPRAY | Freq: Two times a day (BID) | RESPIRATORY_TRACT | Status: DC

## 2014-03-05 MED ORDER — ALBUTEROL SULFATE HFA 108 (90 BASE) MCG/ACT IN AERS: 2.0000 | INHALATION_SPRAY | Freq: Four times a day (QID) | RESPIRATORY_TRACT | Status: DC | PRN

## 2014-03-05 MED ORDER — PRAVASTATIN SODIUM 40 MG PO TABS: 40.0000 mg | ORAL_TABLET | Freq: Every evening | ORAL | Status: DC

## 2014-03-05 MED ORDER — CLONIDINE HCL 0.3 MG PO TABS: 0.3000 mg | ORAL_TABLET | Freq: Two times a day (BID) | ORAL | Status: DC

## 2014-03-05 MED ORDER — DOCUSATE SODIUM 100 MG PO CAPS: 100.0000 mg | ORAL_CAPSULE | Freq: Two times a day (BID) | ORAL | Status: DC | PRN

## 2014-03-05 MED ORDER — NAPROXEN SODIUM 220 MG PO TABS: 440.0000 mg | ORAL_TABLET | Freq: Two times a day (BID) | ORAL | Status: DC | PRN

## 2014-03-05 NOTE — Progress Notes (Signed)
   Subjective:    Patient ID: Ricky Hayden, male    DOB: 17-May-1945, 68 y.o.   MRN: [id number]  HPI  Mr Fuerte is a 68 year old man with HTN, COPD, gout, Peyronies disease, who presents for routine care and notes abdominal pain. The abdominal pain started yesterday morning. He describes it as sharp knives laterally across the superior abdomen. Eating made it worse.He took laxatives to have a bowel movement which did not help. He says the stool looked very hard but not hematachezia or melena. He says he sometimes only has one bowel movement once a week. He says there was red blood in stool one week ago. He had one episode of non-bloody, non-bilious emesis which made it better, after the laxative. He thinks he had some chills last night. Denies chest pain, new shortness of breath, palpitations, fevers, night sweats, change in appetite, abdominal surgeries, weight change. All he has eaten in the past day is some soup and ice cream. Last colonoscopy 2007. No alcohol in ten years. He is on a diet which is trying to reduce carbs.  He had a circumcision in 11/2013 to treat balanitis without complications.  Review of Systems  Constitutional: Positive for chills. Negative for fever, diaphoresis, appetite change and unexpected weight change.  Respiratory: Positive for shortness of breath.   Cardiovascular: Negative for chest pain and palpitations.  Gastrointestinal: Positive for nausea, vomiting, abdominal pain, constipation and abdominal distention. Negative for diarrhea and anal bleeding.  Genitourinary: Negative for dysuria.  Neurological: Negative for dizziness, syncope and light-headedness.       Objective:   Physical Exam  Constitutional: He is oriented to person, place, and time. He appears well-developed and well-nourished. No distress.  HENT:  Head: Normocephalic and atraumatic.  Mouth/Throat: Oropharynx is clear and moist.  Eyes: EOM are normal. Pupils are equal, round, and reactive to  light.  Neck: Neck supple.  Cardiovascular: Normal rate, regular rhythm, normal heart sounds and intact distal pulses.  Exam reveals no gallop and no friction rub.   No murmur heard. Pulmonary/Chest: Effort normal.  B/l diffuse expiratory wheezes  Abdominal: Soft. Bowel sounds are normal. He exhibits distension. He exhibits no mass. There is no tenderness. There is no rebound and no guarding.  Musculoskeletal: He exhibits no edema.  Neurological: He is alert and oriented to person, place, and time.  Skin: He is not diaphoretic.          Assessment & Plan:

## 2014-03-05 NOTE — Patient Instructions (Addendum)
It was a pleasure to meet you today Ricky Hayden. We will get labs and an x-ray. If the results are abnormal, I will call you. Please take the constipation medication as follows: colace twice a day; if no bowel movement after 2-3 days, then dulcolax, if no bowel movement, then magnesium. If you still have belly pain on Wednesday, please schedule an appointment within the next week. If you do not, please schedule a follow-up appointment to go over your maintenance care including flu shot for one month.  General Instructions:   Thank you for bringing your medicines today. This helps [place] keep you safe from mistakes.   Progress Toward Treatment Goals:  Treatment Goal 09/11/2013  Blood pressure at goal    Self Care Goals & Plans:  Self Care Goal 02/13/2013  Manage my medications bring my medications to every visit; refill my medications on time; follow the sick day instructions if I am sick  Monitor my health keep track of my blood pressure  Eat healthy foods eat more vegetables; eat fruit for snacks and desserts; eat baked foods instead of fried foods  Be physically active find an activity I enjoy; take a walk every day    No flowsheet data found.   Care Management & Community Referrals:  Referral 09/11/2013  Referrals made for care management support none needed

## 2014-03-05 NOTE — Assessment & Plan Note (Signed)
BP Readings from Last 3 Encounters:  03/05/14 110/68  11/20/13 140/77  11/13/13 154/95    Lab Results  Component Value Date   NA 143 11/13/2013   K 4.4 11/13/2013   CREATININE 0.91 11/13/2013    Assessment: Blood pressure control: controlled Progress toward BP goal:  at goal Comments: will recheck at [facility] appointment in one month  Plan: Medications:  continue current medications Educational resources provided:   Self management tools provided:   Other plans: none

## 2014-03-05 NOTE — Assessment & Plan Note (Addendum)
A: See HPI for full history. This is likely secondary to constipation. He says he is only have a BM 1x/week. He feels a little better since yesterday. We also considered gastroenteritis this doesn't sound infectious, pancreatitis but this is not radiating to back or intractable pain, cholecystitis but not localized to RUQ, gastritis or GERD seem unlikely given acute onset. Cardiac etiology unlikely as no chest pain, changes in SOB, palpitations, neck or arm radiation, no DM risk factor.  P: We have given him reassurance. We will check basic labs (CMP, CBC, lipase) as well as phenobarbital level while he is here. Also we will get abdominal X-ray to see stool burden. I will call him if there are any abnormal findings. I instructed him to take colace as prescribed BID, then if no BM in 2-3 days dulcolax, and then magnesium. He should call clinic in 2 days if pain remains to be seen. Otherwise, he should return in one month for maintenance care such as flu shot and address COPD.

## 2014-03-05 NOTE — Telephone Encounter (Signed)
I called Mr Topf with the results of his labs this morning. I shared the results of his X-ray which had some dilated small bowel loops and the results of his blood work which was notable for WBC 11.6 and creatinine 1.44 up from 0.91 3 months ago. He said that since he left our office he is feeling better. He was able to eat bbq for lunch and had diet soda for a drink. He tolerated this meal well and says his abdominal pain has decreased. I told him that we were concerned he is dehydrated and were considering admission versus rechecking in two days. He said he would prefer to hydrate himself at home and return Wednesday, 11/4 for lab recheck. He had no questions. I have sent a message to scheduling to have him return on 11/4 for BMET and CBC.  [name], MD 03/05/14 1:56 PM

## 2014-03-06 NOTE — Progress Notes (Signed)
Internal Medicine Clinic Attending  I saw and evaluated the patient.  I personally confirmed the key portions of the history and exam documented by Dr. Ethelene Hal and I reviewed pertinent patient test results.  The assessment, diagnosis, and plan were formulated together and I agree with the documentation in the resident's note. Ricky Hayden was a it distended but overall nontender. He appeared comfortable and at ease, certainly not toxic. He had good BS - normoactive and nl pitched. His XRay and labs were a bit surprising as we were not expecting SBO clinically. Dr Ethelene Hal followed up after results available and he had eaten BBQ for lunch and tolerated it well. Agree with appt mid week.

## 2014-03-07 ENCOUNTER — Ambulatory Visit: Payer: Commercial Managed Care - PPO | Admitting: Internal Medicine

## 2014-03-07 ENCOUNTER — Ambulatory Visit (INDEPENDENT_AMBULATORY_CARE_PROVIDER_SITE_OTHER): Payer: Commercial Managed Care - PPO | Admitting: Internal Medicine

## 2014-03-07 VITALS — BP 144/86 | HR 72 | Temp 97.8°F | Ht 67.0 in | Wt 242.7 lb

## 2014-03-07 DIAGNOSIS — K5900 Constipation, unspecified: Secondary | ICD-10-CM

## 2014-03-07 DIAGNOSIS — N179 Acute kidney failure, unspecified: Secondary | ICD-10-CM | POA: Insufficient documentation

## 2014-03-07 DIAGNOSIS — Z0000 Encounter for general adult medical examination without abnormal findings: Secondary | ICD-10-CM | POA: Insufficient documentation

## 2014-03-07 LAB — BASIC METABOLIC PANEL WITH GFR
BUN: 16 mg/dL (ref 6–23)
CO2: 31 mEq/L (ref 19–32)
Calcium: 9.1 mg/dL (ref 8.4–10.5)
Chloride: 105 mEq/L (ref 96–112)
Creat: 0.93 mg/dL (ref 0.50–1.35)
GFR, Est African American: >89 mL/min
GFR, Est Non African American: 83 mL/min
Glucose, Bld: 103 mg/dL — ABNORMAL HIGH (ref 70–99)
Potassium: 4.6 mEq/L (ref 3.5–5.3)
Sodium: 144 mEq/L (ref 135–145)

## 2014-03-07 LAB — CBC
HCT: 44 % (ref 39.0–52.0)
Hemoglobin: 14.6 g/dL (ref 13.0–17.0)
MCH: 29.7 pg (ref 26.0–34.0)
MCHC: 33.2 g/dL (ref 30.0–36.0)
MCV: 89.6 fL (ref 78.0–100.0)
Platelets: 135 10*3/uL — ABNORMAL LOW (ref 150–400)
RBC: 4.91 MIL/uL (ref 4.22–5.81)
RDW: 13.1 % (ref 11.5–15.5)
WBC: 6 10*3/uL (ref 4.0–10.5)

## 2014-03-07 NOTE — Progress Notes (Signed)
   Subjective:    Patient ID: Ricky Hayden, male    DOB: 04-Jul-1945, 68 y.o.   MRN: [id number]  HPI  Ricky Hayden is a 68 year old man with HTN, COPD, gout, Peyronies disease, who presents for follow-up of his abdominal pain. He was seen on 11/2 for 1 day of sharp pain lateral across his superior abdomen and one episode of NBNB emesis. There was concern that he is constipated as he only has 1 BM/week but he had one hard BM prior to being seen. He got an abdominal film which showed dilated small bowel loops in the abdomen and pelvis with gas and decompressed colon. His labs were also concerning for WBC 11.6 and creatinine 1.44 up from 0.91 3 months prior. When I called him with the results later that afternoon, he said he was feeling better and tolerated his lunch well. I encouraged him to drink lots of fluid and to return today to clinic.  Since then, the pain has improved some. It is now a 3/10 and described as a dull pain. He has not had a bowel movement using colace but no other laxatives. He has had a normal appetite such as having shrimp for dinner last night. His wife has encouraged him to drinks lots of water. No nausea or emesis.   Review of Systems  Constitutional: Negative for fever, chills and diaphoresis.  Respiratory: Negative for shortness of breath.   Cardiovascular: Negative for chest pain.  Gastrointestinal: Positive for abdominal pain and constipation. Negative for nausea, vomiting and diarrhea.  Neurological: Negative for dizziness, weakness, light-headedness, numbness and headaches.       Objective:   Physical Exam  Constitutional: He is oriented to person, place, and time. He appears well-developed and well-nourished. No distress.  HENT:  Head: Normocephalic and atraumatic.  Mouth/Throat: Oropharynx is clear and moist.  Eyes: EOM are normal. Pupils are equal, round, and reactive to light.  Cardiovascular: Normal rate, regular rhythm, normal heart sounds and intact distal  pulses.  Exam reveals no gallop and no friction rub.   No murmur heard. Pulmonary/Chest: Effort normal. No respiratory distress. He has wheezes.  Abdominal: Soft. He exhibits distension. There is no tenderness.  Hypoactive bowel sounds throughout  Musculoskeletal: He exhibits no edema.  Neurological: He is alert and oriented to person, place, and time.  Skin: He is not diaphoretic.  Vitals reviewed.         Assessment & Plan:

## 2014-03-07 NOTE — Assessment & Plan Note (Signed)
Mr Genter was found to have a creatinine of 1.44 on CMP 11/2. This was likely due to dehydration. We encouraged heavy hydration in the interim which the patient tolerated well. His creatinine is now 0.93 and AKI has resolved -no further treatment needed

## 2014-03-07 NOTE — Assessment & Plan Note (Signed)
Mr Bouknight received the flu vaccine today

## 2014-03-07 NOTE — Assessment & Plan Note (Signed)
See HPI. Ricky Hayden's abdominal pain has improved. It was likely secondary to constipation. Abdominal XR findings of distention can be explained by stool burden. Although he has not had a bowel movement, he is still passing gas. He has had interval resolution of his leukocytosis. We discussed the importance of a bowel regimen. He should continue colace BID. If he does not have a BM for over a day, he should then use dulcolax or senocot-s. If he still does not have a BM, he should use magnesium which he said usually works. -see bowel regimen above

## 2014-03-07 NOTE — Patient Instructions (Signed)
It was a pleasure to see you today. As we discussed last time, it is important for you to take your colace stool softener every day. If you do not have a bowel movement for more than a day, I would recommend either dulcolax or senokot-s laxatives. If this still does not work a day later, I would use magnesium. Please return to clinic or seek medical attention if you have any new or worsening abdominal pain, nausea, vomiting, or other worrisome medical condition. We look forward to seeing you again in December.  [name], MD  General Instructions:   Please bring your medicines with you each time you come to clinic.  Medicines may include prescription medications, over-the-counter medications, herbal remedies, eye drops, vitamins, or other pills.   Progress Toward Treatment Goals:  Treatment Goal 03/05/2014  Blood pressure at goal    Self Care Goals & Plans:  Self Care Goal 03/07/2014  Manage my medications take my medicines as prescribed; bring my medications to every visit; refill my medications on time  Monitor my health -  Eat healthy foods eat more vegetables; eat foods that are low in salt; eat baked foods instead of fried foods  Be physically active -    No flowsheet data found.   Care Management & Community Referrals:  Referral 09/11/2013  Referrals made for care management support none needed

## 2014-03-07 NOTE — Addendum Note (Signed)
Addended by: Gevena Cotton A on: 03/07/2014 04:37 PM   Modules accepted: Orders

## 2014-03-08 NOTE — Progress Notes (Signed)
Internal Medicine Clinic Attending  I saw and evaluated the patient.  I personally confirmed the key portions of the history and exam documented by Dr. Rothman and I reviewed pertinent patient test results.  The assessment, diagnosis, and plan were formulated together and I agree with the documentation in the resident's note. 

## 2014-03-20 ENCOUNTER — Other Ambulatory Visit: Payer: Self-pay | Admitting: Internal Medicine

## 2014-04-04 ENCOUNTER — Encounter: Payer: Commercial Managed Care - PPO | Admitting: Internal Medicine

## 2014-04-09 ENCOUNTER — Encounter: Payer: Self-pay | Admitting: *Deleted

## 2014-04-30 ENCOUNTER — Encounter: Payer: Self-pay | Admitting: Internal Medicine

## 2014-04-30 ENCOUNTER — Other Ambulatory Visit: Payer: Self-pay | Admitting: *Deleted

## 2014-05-01 ENCOUNTER — Encounter: Payer: Self-pay | Admitting: Internal Medicine

## 2014-05-01 ENCOUNTER — Other Ambulatory Visit: Payer: Self-pay | Admitting: Internal Medicine

## 2014-05-01 MED ORDER — PHENOBARBITAL 64.8 MG PO TABS: 64.8000 mg | ORAL_TABLET | Freq: Two times a day (BID) | ORAL | Status: DC

## 2014-05-01 NOTE — Telephone Encounter (Signed)
Rx phoned in, dtr aware.Despina Hidden Cassady12/29/201512:29 PM

## 2014-06-13 ENCOUNTER — Encounter: Payer: Commercial Managed Care - PPO | Admitting: Internal Medicine

## 2014-06-14 ENCOUNTER — Telehealth: Payer: Self-pay | Admitting: Internal Medicine

## 2014-06-14 NOTE — Telephone Encounter (Signed)
Call to patient to confirm appointment for 06/18/14 at 3:15 lmtcb

## 2014-06-18 ENCOUNTER — Encounter: Payer: Commercial Managed Care - PPO | Admitting: Internal Medicine

## 2014-06-26 ENCOUNTER — Encounter: Payer: Self-pay | Admitting: Internal Medicine

## 2014-07-12 ENCOUNTER — Telehealth: Payer: Self-pay | Admitting: Internal Medicine

## 2014-07-12 NOTE — Telephone Encounter (Signed)
Call to patient to confirm appointment for 07/16/14 at 3:30. lmtcb

## 2014-07-16 ENCOUNTER — Ambulatory Visit (INDEPENDENT_AMBULATORY_CARE_PROVIDER_SITE_OTHER): Payer: Commercial Managed Care - PPO | Admitting: Internal Medicine

## 2014-07-16 ENCOUNTER — Encounter: Payer: Self-pay | Admitting: Internal Medicine

## 2014-07-16 ENCOUNTER — Ambulatory Visit (HOSPITAL_COMMUNITY)
Admission: RE | Admit: 2014-07-16 | Discharge: 2014-07-16 | Disposition: A | Discharge status: Home / Self Care | Payer: Commercial Managed Care - PPO | Source: Ambulatory Visit | Attending: Internal Medicine | Admitting: Internal Medicine

## 2014-07-16 VITALS — BP 158/80 | HR 89 | Temp 97.8°F | Ht 67.0 in | Wt 247.0 lb

## 2014-07-16 DIAGNOSIS — I10 Essential (primary) hypertension: Secondary | ICD-10-CM

## 2014-07-16 DIAGNOSIS — M7989 Other specified soft tissue disorders: Secondary | ICD-10-CM | POA: Diagnosis present

## 2014-07-16 DIAGNOSIS — N400 Enlarged prostate without lower urinary tract symptoms: Secondary | ICD-10-CM

## 2014-07-16 DIAGNOSIS — F528 Other sexual dysfunction not due to a substance or known physiological condition: Secondary | ICD-10-CM

## 2014-07-16 DIAGNOSIS — N529 Male erectile dysfunction, unspecified: Secondary | ICD-10-CM

## 2014-07-16 MED ORDER — TAMSULOSIN HCL 0.4 MG PO CAPS: 0.4000 mg | ORAL_CAPSULE | Freq: Every day | ORAL | Status: DC

## 2014-07-16 MED ORDER — SILDENAFIL CITRATE 100 MG PO TABS: 50.0000 mg | ORAL_TABLET | ORAL | Status: DC | PRN

## 2014-07-16 NOTE — Assessment & Plan Note (Signed)
This is felt to be secondary to his Peyronie's disease. He has asked for viagra prescription -sildenafil 50 mg daily prn x 6 100 mg tabs

## 2014-07-16 NOTE — Assessment & Plan Note (Signed)
Ricky Hayden notes he gets up to urinate 2-3 times a night while on finasteride 5 mg daily. He has known BPH. Last PSA was 0.6 in 03/2011. Diabetes unlikely as most recent sugars have been normal and normal A1c in 2014.  -tamsulosin 0.4 mg daily -cont finasteride 5 mg daily

## 2014-07-16 NOTE — Assessment & Plan Note (Addendum)
Ricky Hayden has L calf swelling with rare tenderness on lateral aspect. This started acutely yesterday but may be related to his chronic L knee pain. No prior blood clots, prolonged immobility. Less than 3 cm difference in circumference of calves. Will get LLE Doppler to rule out DVT. If DVT is positive, will require direct admission for anticoagulation. No prior L knee films and no known trauma. The knee pain may be osteoarthritis. It does temporarily improve with NSAID. -LLE Doppler -Direct admit for anticoagulation if Doppler positive -consider L knee x-ray if no improvement in one month  ADDENDUM: LLE Doppler results reviewed last night and negative for DVT  [name], MD 07/17/14 11:27 am

## 2014-07-16 NOTE — Assessment & Plan Note (Signed)
BP Readings from Last 3 Encounters:  07/16/14 158/80  03/07/14 144/86  03/05/14 110/68    Lab Results  Component Value Date   NA 144 03/07/2014   K 4.6 03/07/2014   CREATININE 0.93 03/07/2014    Assessment: Blood pressure control: mildly elevated Progress toward BP goal:  deteriorated Comments: Mildly elevated while on clonidine 0.3 mg bid, enalapril 20 mg qhs, and hydralazine 50 mg bid  Plan: Medications:  continue current medications Educational resources provided:   Self management tools provided:   Other plans: also begin flomax 0.4 mg daily for BPH which may slightly lower BP

## 2014-07-16 NOTE — Progress Notes (Signed)
Internal Medicine Clinic Attending  I saw and evaluated the patient.  I personally confirmed the key portions of the history and exam documented by Dr. Rothman and I reviewed pertinent patient test results.  The assessment, diagnosis, and plan were formulated together and I agree with the documentation in the resident's note. 

## 2014-07-16 NOTE — Progress Notes (Addendum)
*  Preliminary Results* Left lower extremity venous duplex completed. Left lower extremity is negative for deep vein thrombosis. There is no evidence of left Baker's cyst.  Attempted to call report, however there was no answer. The patient was discharged and can be reached by phone if necessary.      07/16/2014 5:25 PM  Maudry Mayhew, RVT, RDCS, RDMS

## 2014-07-16 NOTE — Progress Notes (Signed)
   Subjective:    Patient ID: Ricky Hayden, male    DOB: [birth year], 69 y.o.   MRN: [id number]  HPI  Mr Blumenschein is a 69 year old man with HTN, COPD, gout, Peyronies disease here for routine check-up.  Today his concerns are as follows. He says he gets up to use the restroom 2-3 times a night. He says that his L knee has bothered him for about a year off and on. Sometimes when he is going up steps it brings it on. It can last all day and then go several days without it. He also noticed some swelling in his L calf yesterday. He denies any calf pain but does call it "tight" several time. He thinks that he is relatively immobile but no recent travel. He denies any previous blood clot.   Review of Systems  Constitutional: Negative for fever, chills and diaphoresis.  Respiratory: Negative for shortness of breath.   Cardiovascular: Positive for leg swelling. Negative for chest pain.  Gastrointestinal: Negative for nausea, vomiting, abdominal pain, diarrhea and constipation.  Musculoskeletal: Positive for arthralgias. Negative for joint swelling.  Neurological: Negative for weakness, light-headedness, numbness and headaches.       Objective:   Physical Exam  Constitutional: He is oriented to person, place, and time. He appears well-developed and well-nourished. No distress.  HENT:  Head: Normocephalic and atraumatic.  Mouth/Throat: Oropharynx is clear and moist.  Eyes: EOM are normal. Pupils are equal, round, and reactive to light.  Cardiovascular: Normal rate, regular rhythm, normal heart sounds and intact distal pulses.  Exam reveals no gallop and no friction rub.   No murmur heard. Pulmonary/Chest: Effort normal and breath sounds normal. No respiratory distress. He has no wheezes.  Abdominal: Soft. Bowel sounds are normal. He exhibits distension. There is no tenderness.  Musculoskeletal:  L calf 42 cm circumference, R calf 40 cm circumference. Rare tenderness on palpation of L lateral  calf, 1+ edema to calves b/l. L knee with no effusion, no joint line tenderness  Neurological: He is alert and oriented to person, place, and time.  Skin: He is not diaphoretic.  Vitals reviewed.         Assessment & Plan:

## 2014-07-16 NOTE — Patient Instructions (Signed)
It was a pleasure to see you today. Please go to get your leg ultrasound now. We will call you with results. Please take the flomax once a day. Please return to clinic or seek medical attention if you have any new or worsening troubel breathing, coughing blood, or other worrisome medical condition. We look forward to seeing you again in one month.  [name], MD  General Instructions:   Thank you for bringing your medicines today. This helps [place] keep you safe from mistakes.   Progress Toward Treatment Goals:  Treatment Goal 03/05/2014  Blood pressure at goal    Self Care Goals & Plans:  Self Care Goal 07/16/2014  Manage my medications take my medicines as prescribed; bring my medications to every visit  Monitor my health -  Eat healthy foods eat more vegetables; eat foods that are low in salt; eat baked foods instead of fried foods  Be physically active -    No flowsheet data found.   Care Management & Community Referrals:  Referral 09/11/2013  Referrals made for care management support none needed

## 2014-07-21 ENCOUNTER — Encounter: Payer: Self-pay | Admitting: Internal Medicine

## 2014-07-23 ENCOUNTER — Encounter: Payer: Commercial Managed Care - PPO | Admitting: Internal Medicine

## 2014-07-23 ENCOUNTER — Encounter: Payer: Self-pay | Admitting: Internal Medicine

## 2014-08-27 ENCOUNTER — Encounter: Payer: Self-pay | Admitting: *Deleted

## 2014-09-25 ENCOUNTER — Other Ambulatory Visit: Payer: Self-pay | Admitting: *Deleted

## 2014-09-25 MED ORDER — FINASTERIDE 5 MG PO TABS
5.0000 mg | ORAL_TABLET | Freq: Every day | ORAL | Status: DC
Start: 2014-09-25 — End: 2014-12-10

## 2014-10-02 ENCOUNTER — Encounter: Payer: Self-pay | Admitting: Internal Medicine

## 2014-10-13 ENCOUNTER — Other Ambulatory Visit: Payer: Self-pay | Admitting: Internal Medicine

## 2014-10-15 ENCOUNTER — Other Ambulatory Visit: Payer: Self-pay | Admitting: *Deleted

## 2014-10-15 DIAGNOSIS — I10 Essential (primary) hypertension: Secondary | ICD-10-CM

## 2014-10-16 MED ORDER — PHENOBARBITAL 64.8 MG PO TABS: 64.8000 mg | ORAL_TABLET | Freq: Two times a day (BID) | ORAL | Status: DC

## 2014-10-16 MED ORDER — CLONIDINE HCL 0.3 MG PO TABS: 0.3000 mg | ORAL_TABLET | Freq: Two times a day (BID) | ORAL | Status: DC

## 2014-10-16 MED ORDER — HYDRALAZINE HCL 25 MG PO TABS: 50.0000 mg | ORAL_TABLET | Freq: Two times a day (BID) | ORAL | Status: DC

## 2014-10-16 NOTE — Telephone Encounter (Signed)
Called to pharm 

## 2014-10-29 ENCOUNTER — Other Ambulatory Visit: Payer: Self-pay

## 2014-12-08 ENCOUNTER — Other Ambulatory Visit: Payer: Self-pay | Admitting: Internal Medicine

## 2014-12-10 ENCOUNTER — Ambulatory Visit (INDEPENDENT_AMBULATORY_CARE_PROVIDER_SITE_OTHER): Payer: Commercial Managed Care - PPO | Admitting: Internal Medicine

## 2014-12-10 ENCOUNTER — Encounter: Payer: Self-pay | Admitting: Internal Medicine

## 2014-12-10 VITALS — BP 158/75 | HR 75 | Temp 97.9°F | Ht 67.0 in | Wt 230.6 lb

## 2014-12-10 DIAGNOSIS — N400 Enlarged prostate without lower urinary tract symptoms: Secondary | ICD-10-CM | POA: Diagnosis not present

## 2014-12-10 DIAGNOSIS — M7989 Other specified soft tissue disorders: Secondary | ICD-10-CM

## 2014-12-10 DIAGNOSIS — I10 Essential (primary) hypertension: Secondary | ICD-10-CM | POA: Diagnosis not present

## 2014-12-10 DIAGNOSIS — Z0000 Encounter for general adult medical examination without abnormal findings: Secondary | ICD-10-CM

## 2014-12-10 DIAGNOSIS — K219 Gastro-esophageal reflux disease without esophagitis: Secondary | ICD-10-CM

## 2014-12-10 DIAGNOSIS — Z23 Encounter for immunization: Secondary | ICD-10-CM

## 2014-12-10 DIAGNOSIS — J449 Chronic obstructive pulmonary disease, unspecified: Secondary | ICD-10-CM | POA: Diagnosis not present

## 2014-12-10 DIAGNOSIS — M1A9XX0 Chronic gout, unspecified, without tophus (tophi): Secondary | ICD-10-CM

## 2014-12-10 DIAGNOSIS — M109 Gout, unspecified: Secondary | ICD-10-CM

## 2014-12-10 MED ORDER — ENALAPRIL MALEATE 20 MG PO TABS: 20.0000 mg | ORAL_TABLET | Freq: Every evening | ORAL | Status: DC

## 2014-12-10 MED ORDER — ALLOPURINOL 100 MG PO TABS: 200.0000 mg | ORAL_TABLET | Freq: Every day | ORAL | Status: DC

## 2014-12-10 MED ORDER — TAMSULOSIN HCL 0.4 MG PO CAPS: 0.4000 mg | ORAL_CAPSULE | Freq: Every day | ORAL | Status: DC

## 2014-12-10 MED ORDER — HYDRALAZINE HCL 25 MG PO TABS: 50.0000 mg | ORAL_TABLET | Freq: Two times a day (BID) | ORAL | Status: DC

## 2014-12-10 MED ORDER — PHENOBARBITAL 64.8 MG PO TABS: 64.8000 mg | ORAL_TABLET | Freq: Two times a day (BID) | ORAL | Status: DC

## 2014-12-10 MED ORDER — CLONIDINE HCL 0.3 MG PO TABS: 0.3000 mg | ORAL_TABLET | Freq: Two times a day (BID) | ORAL | Status: DC

## 2014-12-10 MED ORDER — PRAVASTATIN SODIUM 40 MG PO TABS: 40.0000 mg | ORAL_TABLET | Freq: Every evening | ORAL | Status: DC

## 2014-12-10 MED ORDER — FINASTERIDE 5 MG PO TABS: 5.0000 mg | ORAL_TABLET | Freq: Every day | ORAL | Status: DC

## 2014-12-10 MED ORDER — ALBUTEROL SULFATE HFA 108 (90 BASE) MCG/ACT IN AERS: 2.0000 | INHALATION_SPRAY | Freq: Four times a day (QID) | RESPIRATORY_TRACT | Status: AC | PRN

## 2014-12-10 MED ORDER — POLYETHYLENE GLYCOL 3350 17 G PO PACK: 17.0000 g | PACK | Freq: Every day | ORAL | Status: DC | PRN

## 2014-12-10 MED ORDER — OMEPRAZOLE 20 MG PO CPDR: 20.0000 mg | DELAYED_RELEASE_CAPSULE | Freq: Every day | ORAL | Status: DC

## 2014-12-10 MED ORDER — MOMETASONE FURO-FORMOTEROL FUM 100-5 MCG/ACT IN AERO: 2.0000 | INHALATION_SPRAY | Freq: Two times a day (BID) | RESPIRATORY_TRACT | Status: DC

## 2014-12-10 NOTE — Assessment & Plan Note (Signed)
Patient currently on Dulera 100-5 BID. Reports he only uses his Albuterol inhaler once every month or so. Symptoms well controlled. Will continue current management.

## 2014-12-10 NOTE — Assessment & Plan Note (Addendum)
Patient received PCV13 today. Not interested in getting Zostavax.

## 2014-12-10 NOTE — Assessment & Plan Note (Signed)
Patient reports he has not had a gout flare in over a year since stopping HCTZ.  - Continue Allopurinol 200 mg daily - Continue colchicine prn acute flares

## 2014-12-10 NOTE — Addendum Note (Signed)
Addended by: Sander Nephew F on: 12/10/2014 03:42 PM   Modules accepted: Orders

## 2014-12-10 NOTE — Assessment & Plan Note (Signed)
Patient reports improvement in his symptoms since starting tamsulosin in March. Still having to get up during the night but less frequently than before. No difficulties initiating or maintaining stream.  - Cont Tamsulosin 0.4 mg daily - Cont Finasteride 5 mg daily

## 2014-12-10 NOTE — Progress Notes (Signed)
Internal Medicine Clinic Attending  I saw and evaluated the patient.  I personally confirmed the key portions of the history and exam documented by Dr. Boswell and I reviewed pertinent patient test results.  The assessment, diagnosis, and plan were formulated together and I agree with the documentation in the resident's note. 

## 2014-12-10 NOTE — Progress Notes (Signed)
Patient ID: Ricky Hayden, male   DOB: 1945/12/07, 69 y.o.   MRN: [id number]   Subjective:   Patient ID: Ricky Hayden male   DOB: 18-Nov-1945 69 y.o.   MRN: [id number]  HPI: [name] is a 69 y.o. male with a PMH listed below here today for routine check up and medication refills.  Today, he is concerned about LLE edema. It has been persistent since his last visit in March. A doppler done then showed no DVT but he has had persistent edema in his left leg since that time. He has never had any issues with it before. Denies any pain or tenderness, no fevers chills or erythema. Says that it gets worse if he is on his feet for prolonged periods and improves if he props his feet up.  No history of cardiac disease and no SOB with exertion.   Past Medical History  Diagnosis Date  . Hyperlipidemia   . Hypertension   . Arthritis     osteoarthritis of left knee  . Sinusitis     s/p ethmoidectomy and nasal septoplasty  . GERD (gastroesophageal reflux disease)   . Hiatal hernia   . Gout   . 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 Intracranial hematoma 1995    history of, s/p evacuation by Dr. Sherwood Gambler  . 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 Chronic joint pain   . Coronary artery disease   . PONV (postoperative nausea and vomiting)   . COPD (chronic obstructive pulmonary disease)   . Vertigo     intermitantly  . Balanitis     recurrent  . Nocturia   . Enlarged prostate    Current Outpatient Prescriptions  Medication Sig Dispense Refill  . albuterol (PROVENTIL HFA;VENTOLIN HFA) 108 (90 BASE) MCG/ACT inhaler Inhale 2 puffs into the lungs every 6 (six) hours as needed for wheezing. 1 Inhaler 11  . allopurinol (ZYLOPRIM) 100 MG tablet Take 2 tablets (200 mg total) by mouth daily. 60 tablet 11  . cloNIDine (CATAPRES) 0.3 MG tablet Take 1 tablet (0.3 mg total) by mouth 2 (two) times daily. 180 tablet 2  . clotrimazole (LOTRIMIN)  1 % cream Apply 1 application topically 2 (two) times daily as needed (rash).    . colchicine 0.6 MG tablet Take 1 tablet (0.6 mg total) by mouth 2 (two) times daily as needed. Use only during gout flare 60 tablet 11  . docusate sodium (COLACE) 100 MG capsule Take 1-2 capsules (100-200 mg total) by mouth 2 (two) times daily as needed for mild constipation. 180 capsule 3  . enalapril (VASOTEC) 20 MG tablet Take 1 tablet (20 mg total) by mouth every evening. 90 tablet 3  . finasteride (PROSCAR) 5 MG tablet Take 1 tablet (5 mg total) by mouth daily. 30 tablet 2  . hydrALAZINE (APRESOLINE) 25 MG tablet Take 2 tablets (50 mg total) by mouth 2 (two) times daily. 360 tablet 2  . hydrocortisone cream 1 % Apply 1 application topically 2 (two) times daily as needed for itching.    . mometasone-formoterol (DULERA) 100-5 MCG/ACT AERO Inhale 2 puffs into the lungs 2 (two) times daily. 1 Inhaler 11  . naproxen sodium (ANAPROX) 220 MG tablet Take 2 tablets (440 mg total) by mouth 2 (two) times daily as needed (pain). 180 tablet 3  . omeprazole (PRILOSEC) 20 MG capsule Take 1 capsule (20 mg total) by mouth daily. 30 capsule  11  . PHENobarbital (LUMINAL) 64.8 MG tablet Take 1 tablet (64.8 mg total) by mouth 2 (two) times daily. 180 tablet 2  . phenylephrine (SUDAFED PE) 10 MG TABS tablet Take 10 mg by mouth every 4 (four) hours as needed. Congestion    . polyethylene glycol (MIRALAX / GLYCOLAX) packet Take 17 g by mouth daily as needed for moderate constipation. 14 each 0  . pravastatin (PRAVACHOL) 40 MG tablet Take 1 tablet (40 mg total) by mouth every evening. 90 tablet 3  . sildenafil (VIAGRA) 100 MG tablet Take 0.5 tablets (50 mg total) by mouth as needed for erectile dysfunction. 6 tablet 0  . tamsulosin (FLOMAX) 0.4 MG CAPS capsule Take 1 capsule (0.4 mg total) by mouth daily. 90 capsule 2   No current facility-administered medications for this visit.   No family history on file. History   Social History    . Marital Status: Married    Spouse Name: N/A  . Number of Children: N/A  . Years of Education: N/A   Social History Main Topics  . Smoking status: Former Smoker    Quit date: 05/04/1986  . Smokeless tobacco: Not on file  . Alcohol Use: No  . Drug Use: No  . Sexual Activity: Not on file   Other Topics Concern  . None   Social History Narrative   Review of Systems: Review of Systems  Constitutional: Negative for fever and chills.  Respiratory: Negative for shortness of breath.   Cardiovascular: Positive for leg swelling. Negative for chest pain, palpitations, orthopnea and claudication.  Gastrointestinal: Positive for constipation. Negative for abdominal pain, blood in stool and melena.  Genitourinary: Negative for dysuria, urgency and frequency.  Skin: Negative for rash.   Objective:  Physical Exam: Filed Vitals:   12/10/14 1325  BP: 158/75  Pulse: 75  Temp: 97.9 F (36.6 C)  TempSrc: Oral  Height: 5\' 7"  (1.702 m)  Weight: 230 lb 9.6 oz (104.599 kg)  SpO2: 99%   Physical Exam GENERAL- alert, co-operative, appears as stated age, not in any distress. HEENT- Atraumatic, normocephalic, PERRL, EOMI, oral mucosa appears moist, good and intact dentition. No carotid bruit. CARDIAC- RRR, no murmurs, rubs or gallops. RESP- Moving equal volumes of air, and clear to auscultation bilaterally, no wheezes or crackles. ABDOMEN- Soft, nontender, no guarding or rebound, bowel sounds present. NEURO- No obvious Cr N abnormality, moves all extremities well. EXTREMITIES- pulse 1+, symmetric. 2+ pitting edema to level of knee on the left, 1+ on right to level of mid shin. L knee with no effusion. There is a large, soft, non tender mass in left lateral thigh. SKIN- Warm, dry, No rash or lesion. PSYCH- Normal mood and affect, appropriate thought content and speech.    Physical Exam  Constitutional: He is oriented to person, place, and time. He appears well-developed and well-nourished.  No distress.  HENT:  Head: Normocephalic and atraumatic.  Mouth/Throat: Oropharynx is clear and moist.  Eyes: EOM are normal. Pupils are equal, round, and reactive to light.  Cardiovascular: Normal rate, regular rhythm, normal heart sounds and intact distal pulses. Exam reveals no gallop and no friction rub.  No murmur heard. Pulmonary/Chest: Effort normal and breath sounds normal. No respiratory distress. He has no wheezes.  Abdominal: Soft. Bowel sounds are normal. He exhibits distension. There is no tenderness.  Musculoskeletal:  L calf 42 cm circumference, R calf 40 cm circumference. Rare tenderness on palpation of L lateral calf, 1+ edema to calves b/l. L knee  with no effusion, no joint line tenderness  Neurological: He is alert and oriented to person, place, and time.  Skin: He is not diaphoretic.  Vitals reviewed.  Assessment & Plan:   Case discussed with Dr. Lynnae January. Please refer to Problem based carting for further details of today's visit.

## 2014-12-10 NOTE — Assessment & Plan Note (Signed)
Patient reports continued swelling of his LLE since previous visit on 3/14. The swelling is worse with prolonged standing and improves when he keeps his legs elevated. Denies any pain or tenderness. No history of cardiac disease. No SOB with exertion. Doppler at last visit was negative for a DVT.  He does have a palpable mass in his left lateral thigh. He reports it has been present for >20 years with no progression in size. Large non-tender, freely movable mass in left lateral thigh.   - Will get MRI of Left Femur to r/o any obstruction from growing mass, if negative will consider venous dopplers to assess for venous insufficiency

## 2014-12-10 NOTE — Assessment & Plan Note (Signed)
BP Readings from Last 3 Encounters:  12/10/14 158/75  07/16/14 158/80  03/07/14 144/86    Lab Results  Component Value Date   NA 144 03/07/2014   K 4.6 03/07/2014   CREATININE 0.93 03/07/2014    Assessment: Blood pressure control:  mildly elevated Progress toward BP goal:   no change Comments: Patient currently taking clonidine 0.3 mg bid, Enalapril 20 mg qhs, and hydralazine 50 mg bid. Was previously on HCTZ but stopped due to concerns for causing/exacerbating his gout (see gout notes). Patient reports his BP is usually in the 140-150/70-80 range when he takes his BP at home.  Plan: Medications:  continue current medications Educational resources provided:  Self management tools provided:   Other plans: Goal in age > 76, less than 150/90. Slightly elevated in clinic but he reports being at goal at home. Will ask him to keep a log of his BPs at home to bring in to clinic at next visit. Ordered a sleep study today, does report some snoring as well as waking up gasping for air at times. RTC in 2-4 weeks for follow up.

## 2014-12-10 NOTE — Assessment & Plan Note (Signed)
Patient reports symptoms controlled on Omeprazole. Will continue current management.

## 2014-12-10 NOTE — Patient Instructions (Signed)
General Instructions: -Thank you for coming in today, it was a pleasure to meet you - We will get an MRI of your left leg to evaluate the swelling and mass in your left thigh. If that is negative I would like to get a venus doppler study on that leg to evaluate for venus insufficiency. - Please keep a log of your blood pressures at home and bring that in with you to your next visit. - We are going to get you set up for a sleep study - Please return to clinic in 2-4 weeks for a follow up  Progress Toward Treatment Goals:  Treatment Goal 07/16/2014  Blood pressure deteriorated   Self Care Goals & Plans:  Self Care Goal 12/10/2014  Manage my medications take my medicines as prescribed; bring my medications to every visit; refill my medications on time  Monitor my health -  Eat healthy foods drink diet soda or water instead of juice or soda; eat more vegetables; eat foods that are low in salt; eat baked foods instead of fried foods; eat fruit for snacks and desserts  Be physically active -     Care Management & Community Referrals:  Referral 09/11/2013  Referrals made for care management support none needed

## 2014-12-28 ENCOUNTER — Encounter: Payer: Self-pay | Admitting: Internal Medicine

## 2014-12-31 ENCOUNTER — Ambulatory Visit (HOSPITAL_COMMUNITY): Admission: RE | Admit: 2014-12-31 | Payer: Commercial Managed Care - PPO | Source: Ambulatory Visit

## 2015-01-11 ENCOUNTER — Encounter: Payer: Self-pay | Admitting: Internal Medicine

## 2015-01-11 DIAGNOSIS — M7989 Other specified soft tissue disorders: Secondary | ICD-10-CM

## 2015-01-15 NOTE — Telephone Encounter (Signed)
I apologize for the delay in response. I am sorry to hear that the MRI is so expensive. We were going to do venous dopplers if the MRI did not show [place] anything so we can go ahead and get that done. I will have my nurse schedule it for you. It is just done by ultrasound so it should not be a very expensive test to get and can give [place] some information about what may be causing the leg swelling. We can keep the appointment scheduled for now and hopefully can get the dopplers done before then.

## 2015-01-30 ENCOUNTER — Encounter: Payer: Commercial Managed Care - PPO | Admitting: Internal Medicine

## 2015-03-02 ENCOUNTER — Other Ambulatory Visit: Payer: Self-pay | Admitting: Internal Medicine

## 2015-03-02 DIAGNOSIS — I10 Essential (primary) hypertension: Secondary | ICD-10-CM

## 2015-03-04 ENCOUNTER — Encounter: Payer: Self-pay | Admitting: Internal Medicine

## 2015-03-04 DIAGNOSIS — M7989 Other specified soft tissue disorders: Secondary | ICD-10-CM

## 2015-03-05 MED ORDER — CLONIDINE HCL 0.3 MG PO TABS: 0.3000 mg | ORAL_TABLET | Freq: Two times a day (BID) | ORAL | Status: DC

## 2015-03-05 MED ORDER — PHENOBARBITAL 64.8 MG PO TABS: 64.8000 mg | ORAL_TABLET | Freq: Two times a day (BID) | ORAL | Status: DC

## 2015-03-05 MED ORDER — HYDRALAZINE HCL 25 MG PO TABS: 50.0000 mg | ORAL_TABLET | Freq: Two times a day (BID) | ORAL | Status: DC

## 2015-03-05 MED ORDER — TAMSULOSIN HCL 0.4 MG PO CAPS: 0.4000 mg | ORAL_CAPSULE | Freq: Every day | ORAL | Status: DC

## 2015-03-11 ENCOUNTER — Ambulatory Visit (HOSPITAL_COMMUNITY)
Admission: RE | Admit: 2015-03-11 | Discharge: 2015-03-11 | Disposition: A | Discharge status: Home / Self Care | Payer: Commercial Managed Care - PPO | Source: Ambulatory Visit | Attending: Internal Medicine | Admitting: Internal Medicine

## 2015-03-11 ENCOUNTER — Telehealth: Payer: Self-pay | Admitting: *Deleted

## 2015-03-11 DIAGNOSIS — I10 Essential (primary) hypertension: Secondary | ICD-10-CM | POA: Insufficient documentation

## 2015-03-11 DIAGNOSIS — M7989 Other specified soft tissue disorders: Secondary | ICD-10-CM | POA: Insufficient documentation

## 2015-03-11 DIAGNOSIS — E785 Hyperlipidemia, unspecified: Secondary | ICD-10-CM | POA: Insufficient documentation

## 2015-03-11 NOTE — Progress Notes (Signed)
VASCULAR LAB PRELIMINARY  PRELIMINARY  PRELIMINARY  PRELIMINARY  Bilateral lower extremity venous duplex completed.    Preliminary report:  There is no DVT or SVT noted in the bilateral lower extremities.   Janaria Mccammon, RVT 03/11/2015, 2:35 PM

## 2015-03-11 NOTE — Telephone Encounter (Signed)
Vascular Lab at [facility] called to report neg on doppler done today. States they already told pt doppler was neg. Hilda Blades Jaslynn Thome RN 03/11/15 2:40PM

## 2015-03-17 ENCOUNTER — Ambulatory Visit (HOSPITAL_BASED_OUTPATIENT_CLINIC_OR_DEPARTMENT_OTHER): Payer: Commercial Managed Care - PPO | Attending: Internal Medicine

## 2015-03-17 ENCOUNTER — Ambulatory Visit (HOSPITAL_BASED_OUTPATIENT_CLINIC_OR_DEPARTMENT_OTHER): Payer: Commercial Managed Care - PPO

## 2015-04-01 ENCOUNTER — Ambulatory Visit (INDEPENDENT_AMBULATORY_CARE_PROVIDER_SITE_OTHER): Payer: Commercial Managed Care - PPO | Admitting: Internal Medicine

## 2015-04-01 ENCOUNTER — Encounter: Payer: Self-pay | Admitting: Internal Medicine

## 2015-04-01 ENCOUNTER — Telehealth: Payer: Self-pay | Admitting: *Deleted

## 2015-04-01 VITALS — BP 161/85 | HR 70 | Temp 98.2°F | Ht 67.0 in | Wt 238.8 lb

## 2015-04-01 DIAGNOSIS — I10 Essential (primary) hypertension: Secondary | ICD-10-CM

## 2015-04-01 DIAGNOSIS — Z0000 Encounter for general adult medical examination without abnormal findings: Secondary | ICD-10-CM | POA: Diagnosis not present

## 2015-04-01 DIAGNOSIS — Z23 Encounter for immunization: Secondary | ICD-10-CM

## 2015-04-01 DIAGNOSIS — M7989 Other specified soft tissue disorders: Secondary | ICD-10-CM

## 2015-04-01 MED ORDER — ENALAPRIL MALEATE 20 MG PO TABS: 40.0000 mg | ORAL_TABLET | Freq: Every evening | ORAL | Status: DC

## 2015-04-01 NOTE — Assessment & Plan Note (Signed)
BP Readings from Last 3 Encounters:  04/01/15 161/85  12/10/14 158/75  07/16/14 158/80    Lab Results  Component Value Date   NA 144 03/07/2014   K 4.6 03/07/2014   CREATININE 0.93 03/07/2014    Assessment: Ricky Hayden is currently taking clonidine 0.3 mg bid, Enalapril 20 mg qhs, and hydralazine 50 mg bid. He reports compliance with his medications. His blood pressure remains mildly elevated today at 168/88 and 161/85 on repeat. He does check his BP at home (did not bring log today) and reports it is usually in the 150s/90s and not too far off from today's readings. Denies any headaches, dizziness, visual disturbances. He reports he never had the sleep study done as he does not want to wear any mask at night. He does report snoring at night as well as apneic episodes. Discussed the importance of treating sleep apnea and how it can also affect his blood pressure. He seemed agreeable to having the sleep study done after our discussion.  Plan: Will increase his enalapril to 40 mg daily. Continue clonidine and hydral. Will reschedule sleep study.

## 2015-04-01 NOTE — Assessment & Plan Note (Addendum)
Patient reports continued LLE edema. He was unable to have the MRI done after his last visit as it was too expensive for him. Reports swelling is improved overnight and returns during the day while he is on his feet. Today he does have 1+ edema to his knees bilaterally. He is only complaining of left leg swelling though. Does report shortness of breath with exertion at times. Reports he has not been walking since he starting having this problem in March but had previously been able to walk a lap around the [employer] (where his wife works) with no problems but was starting to have occasional times where he was only able to walk 3/4 of a lap due to SOB. He reports he is able to walk at least 2 blocks without getting short of breath and up a flight of stairs with no problems. Does have a history of COPD and reports he occasionally forgets his Dulera which exacerbates his symptoms. Reports he forgot his medication today and has some scattered wheezes on exam. Does report he cannot lie flat on his back due to difficulty breathing.   Plan -Checking CMP and UA today to r/o any oncotic cause of his edema -Will get an ECHO -f/u in 1 moth

## 2015-04-01 NOTE — Progress Notes (Signed)
Patient ID: Ricky Hayden, male   DOB: [birth year], 69 y.o.   MRN: [id number]   Subjective:   Patient ID: Ricky Hayden male   DOB: 04-17-1946 69 y.o.   MRN: [id number]  HPI: [name] is a 69 y.o. male with a past medical history listed below here today for follow up of his HTN and LLE edema.   For details of today's visit and the status of his chronic medical issues please refer to the assessment and plan.  Past Medical History  Diagnosis Date  . Hyperlipidemia   . Hypertension   . Arthritis     osteoarthritis of left knee  . Sinusitis     s/p ethmoidectomy and nasal septoplasty  . GERD (gastroesophageal reflux disease)   . Hiatal hernia   . Gout   . 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 Intracranial hematoma ([place]) 1995    history of, s/p evacuation by Dr. Sherwood Gambler  . 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 Chronic joint pain   . Coronary artery disease   . PONV (postoperative nausea and vomiting)   . COPD (chronic obstructive pulmonary disease) ([place])   . Vertigo     intermitantly  . Balanitis     recurrent  . Nocturia   . Enlarged prostate    Current Outpatient Prescriptions  Medication Sig Dispense Refill  . albuterol (PROVENTIL HFA;VENTOLIN HFA) 108 (90 BASE) MCG/ACT inhaler Inhale 2 puffs into the lungs every 6 (six) hours as needed for wheezing. 1 Inhaler 11  . allopurinol (ZYLOPRIM) 100 MG tablet Take 2 tablets (200 mg total) by mouth daily. 60 tablet 11  . cloNIDine (CATAPRES) 0.3 MG tablet Take 1 tablet (0.3 mg total) by mouth 2 (two) times daily. 180 tablet 3  . clotrimazole (LOTRIMIN) 1 % cream Apply 1 application topically 2 (two) times daily as needed (rash).    . colchicine 0.6 MG tablet Take 1 tablet (0.6 mg total) by mouth 2 (two) times daily as needed. Use only during gout flare 60 tablet 11  . docusate sodium (COLACE) 100 MG capsule Take 1-2 capsules (100-200 mg total) by mouth 2 (two)  times daily as needed for mild constipation. 180 capsule 3  . enalapril (VASOTEC) 20 MG tablet Take 1 tablet (20 mg total) by mouth every evening. 90 tablet 3  . finasteride (PROSCAR) 5 MG tablet TAKE ONE TABLET BY MOUTH ONCE DAILY 30 tablet 11  . hydrALAZINE (APRESOLINE) 25 MG tablet Take 2 tablets (50 mg total) by mouth 2 (two) times daily. 360 tablet 3  . hydrALAZINE (APRESOLINE) 25 MG tablet Take 2 tablets (50 mg total) by mouth 2 (two) times daily. 360 tablet 3  . hydrocortisone cream 1 % Apply 1 application topically 2 (two) times daily as needed for itching.    . mometasone-formoterol (DULERA) 100-5 MCG/ACT AERO Inhale 2 puffs into the lungs 2 (two) times daily. 1 Inhaler 11  . naproxen sodium (ANAPROX) 220 MG tablet Take 2 tablets (440 mg total) by mouth 2 (two) times daily as needed (pain). 180 tablet 3  . omeprazole (PRILOSEC) 20 MG capsule Take 1 capsule (20 mg total) by mouth daily. 30 capsule 11  . PHENobarbital (LUMINAL) 64.8 MG tablet Take 1 tablet (64.8 mg total) by mouth 2 (two) times daily. 180 tablet 3  . phenylephrine (SUDAFED PE) 10 MG TABS tablet Take 10 mg by mouth every 4 (four) hours as needed.  Congestion    . polyethylene glycol (MIRALAX / GLYCOLAX) packet Take 17 g by mouth daily as needed for moderate constipation. 14 each 0  . pravastatin (PRAVACHOL) 40 MG tablet Take 1 tablet (40 mg total) by mouth every evening. 90 tablet 3  . sildenafil (VIAGRA) 100 MG tablet Take 0.5 tablets (50 mg total) by mouth as needed for erectile dysfunction. 6 tablet 0  . tamsulosin (FLOMAX) 0.4 MG CAPS capsule Take 1 capsule (0.4 mg total) by mouth daily. 90 capsule 3   No current facility-administered medications for this visit.   No family history on file. Social History   Social History  . Marital Status: Married    Spouse Name: N/A  . Number of Children: N/A  . Years of Education: N/A   Social History Main Topics  . Smoking status: Former Smoker    Quit date: 05/04/1986  .  Smokeless tobacco: None  . Alcohol Use: No  . Drug Use: No  . Sexual Activity: Not Asked   Other Topics Concern  . None   Social History Narrative   Review of Systems: Review of Systems  Eyes: Negative for blurred vision and double vision.  Respiratory: Positive for shortness of breath and wheezing. Negative for cough and sputum production.   Cardiovascular: Positive for orthopnea and leg swelling. Negative for chest pain, palpitations and PND.  Musculoskeletal: Negative for falls.  Neurological: Negative for dizziness and headaches.   Objective:  Physical Exam: Filed Vitals:   04/01/15 1317  BP: 168/88  Pulse: 76  Temp: 98.2 F (36.8 C)  TempSrc: Oral  Height: 5\' 7"  (1.702 m)  Weight: 238 lb 12.8 oz (108.319 kg)  SpO2: 98%    Physical Exam  Constitutional: He is oriented to person, place, and time. He appears well-developed and well-nourished. No distress.  HENT:  Head: Normocephalic and atraumatic.  Mouth/Throat: Oropharynx is clear and moist.  Eyes: EOM are normal. Pupils are equal, round, and reactive to light.  Cardiovascular: Normal rate, regular rhythm, normal heart sounds and intact distal pulses. No murmur heard. Pulmonary/Chest: Effort normal and breath sounds normal. No respiratory distress. Scattered wheezes present. Abdominal: Soft. Bowel sounds are normal. He exhibits distension. There is no tenderness.  Musculoskeletal:  No tenderness on palpation of L lateral calf, 1+ edema to calves B/L. L knee with no effusion, no joint line tenderness  Neurological: He is alert and oriented to person, place, and time.  Skin: He is not diaphoretic.  Vitals reviewed.  Assessment & Plan:   Case discussed with Dr. Daryll Drown. Please refer to Problem based charting for further details of today's visit.

## 2015-04-01 NOTE — Patient Instructions (Addendum)
Thank you for coming in today.  -I am going to increase your Enalapril to 40 mg daily today. You currently have 20 mg pills so take 2 pills instead of 1. -I am also going to get you set up to have an ultrasound of your heart (ECHO) to make sure the swelling is not from heart problems -I am going to check some blood work today as well. I will call you if there are any problems.  -I would like to see you back in 1 month for follow up.

## 2015-04-01 NOTE — Assessment & Plan Note (Signed)
Mr. Freely received the flu shot today

## 2015-04-01 NOTE — Telephone Encounter (Signed)
Called patient left voice message regarding is sleep study appointment for [phone] to arrive 8:00pm at the [facility] long sleep center.

## 2015-04-02 LAB — CMP14 + ANION GAP
ALBUMIN: 4.2 g/dL (ref 3.5–4.8)
ALT: 19 IU/L (ref 0–44)
ANION GAP: 16 mmol/L (ref 10.0–18.0)
AST: 13 IU/L (ref 0–40)
Albumin/Globulin Ratio: 1.7 (ref 1.1–2.5)
Alkaline Phosphatase: 73 IU/L (ref 39–117)
BUN / CREAT RATIO: 12 (ref 10–22)
BUN: 12 mg/dL (ref 8–27)
Bilirubin Total: 0.2 mg/dL (ref 0.0–1.2)
CO2: 27 mmol/L (ref 18–29)
CREATININE: 0.99 mg/dL (ref 0.76–1.27)
Calcium: 9.4 mg/dL (ref 8.6–10.2)
Chloride: 105 mmol/L (ref 97–106)
GFR, EST AFRICAN AMERICAN: 88 mL/min/{1.73_m2} (ref >59)
GFR, EST NON AFRICAN AMERICAN: 76 mL/min/{1.73_m2} (ref >59)
Globulin, Total: 2.5 g/dL (ref 1.5–4.5)
Glucose: 98 mg/dL (ref 65–99)
Potassium: 5.1 mmol/L (ref 3.5–5.2)
Sodium: 148 mmol/L — ABNORMAL HIGH (ref 136–144)
TOTAL PROTEIN: 6.7 g/dL (ref 6.0–8.5)

## 2015-04-02 LAB — URINALYSIS, ROUTINE W REFLEX MICROSCOPIC
BILIRUBIN UA: NEGATIVE
GLUCOSE, UA: NEGATIVE
KETONES UA: NEGATIVE
LEUKOCYTES UA: NEGATIVE
Nitrite, UA: NEGATIVE
Protein, UA: NEGATIVE
RBC UA: NEGATIVE
SPEC GRAV UA: 1.022 (ref 1.005–1.030)
Urobilinogen, Ur: 1 mg/dL (ref 0.2–1.0)
pH, UA: 5.5 (ref 5.0–7.5)

## 2015-04-02 NOTE — Telephone Encounter (Signed)
Pt was seen in clinic 04/01/15 Dr Charlynn Grimes.

## 2015-04-09 NOTE — Progress Notes (Signed)
Internal Medicine Clinic Attending  I saw and evaluated the patient.  I personally confirmed the key portions of the history and exam documented by Dr. Boswell and I reviewed pertinent patient test results.  The assessment, diagnosis, and plan were formulated together and I agree with the documentation in the resident's note. 

## 2015-04-15 ENCOUNTER — Ambulatory Visit (HOSPITAL_COMMUNITY): Payer: Commercial Managed Care - PPO | Attending: Cardiology

## 2015-04-15 ENCOUNTER — Other Ambulatory Visit: Payer: Self-pay

## 2015-04-15 DIAGNOSIS — I340 Nonrheumatic mitral (valve) insufficiency: Secondary | ICD-10-CM | POA: Insufficient documentation

## 2015-04-15 DIAGNOSIS — I517 Cardiomegaly: Secondary | ICD-10-CM | POA: Diagnosis not present

## 2015-04-15 DIAGNOSIS — I77810 Thoracic aortic ectasia: Secondary | ICD-10-CM | POA: Diagnosis not present

## 2015-04-15 DIAGNOSIS — I10 Essential (primary) hypertension: Secondary | ICD-10-CM | POA: Diagnosis not present

## 2015-04-15 DIAGNOSIS — I351 Nonrheumatic aortic (valve) insufficiency: Secondary | ICD-10-CM | POA: Insufficient documentation

## 2015-04-15 DIAGNOSIS — R0600 Dyspnea, unspecified: Secondary | ICD-10-CM | POA: Insufficient documentation

## 2015-04-15 DIAGNOSIS — M7989 Other specified soft tissue disorders: Secondary | ICD-10-CM | POA: Diagnosis not present

## 2015-04-23 ENCOUNTER — Telehealth: Payer: Self-pay | Admitting: Internal Medicine

## 2015-04-23 NOTE — Telephone Encounter (Signed)
PA request submitted online via Cover My Meds-pt takes medication for COPD.  See online response below:  Your request has been approved  [id number] Name:ST: Combination beta2-agonist/corticosteroid inhalers (Advair, Symbicort, Dulera, Breo Ellipta) PA 75 - ESI;Status:Approved;Coverage Start Date:03/24/2015;Coverage End Date:04/22/2018;  Will inform patient and pharmacy .Despina Hidden Cassady12/20/20161:18 PM

## 2015-04-23 NOTE — Telephone Encounter (Signed)
Pt requesting PA on Dulera.

## 2015-05-30 ENCOUNTER — Encounter: Payer: Self-pay | Admitting: Internal Medicine

## 2015-06-04 ENCOUNTER — Encounter: Payer: Self-pay | Admitting: Internal Medicine

## 2015-06-07 ENCOUNTER — Telehealth: Payer: Self-pay | Admitting: Internal Medicine

## 2015-06-07 NOTE — Telephone Encounter (Signed)
APPT. REMINDER CALL/LMTCB IS HE NEEDS TO CANCEL

## 2015-06-10 ENCOUNTER — Ambulatory Visit (INDEPENDENT_AMBULATORY_CARE_PROVIDER_SITE_OTHER): Payer: Commercial Managed Care - PPO | Admitting: Internal Medicine

## 2015-06-10 VITALS — BP 142/77 | HR 78 | Temp 97.9°F | Ht 67.0 in | Wt 245.9 lb

## 2015-06-10 DIAGNOSIS — M7989 Other specified soft tissue disorders: Secondary | ICD-10-CM

## 2015-06-10 DIAGNOSIS — I5042 Chronic combined systolic (congestive) and diastolic (congestive) heart failure: Secondary | ICD-10-CM | POA: Diagnosis not present

## 2015-06-10 DIAGNOSIS — I5043 Acute on chronic combined systolic (congestive) and diastolic (congestive) heart failure: Secondary | ICD-10-CM | POA: Insufficient documentation

## 2015-06-10 MED ORDER — FUROSEMIDE 20 MG PO TABS: 20.0000 mg | ORAL_TABLET | Freq: Every day | ORAL | Status: DC

## 2015-06-10 NOTE — Patient Instructions (Signed)
1. Please make a follow up appointment for 1 week.   2. Please take all medications as previously prescribed with the following changes:  Start taking LASIX 20 mg every morning.   Please weigh yourself at home (1x daily) to get an idea of whether you are losing fluid weight.   3. If you have worsening of your symptoms or new symptoms arise, please call the clinic [phone]), or go to the ER immediately if symptoms are severe.  You have done a great job in taking all your medications. Please continue to do this.

## 2015-06-10 NOTE — Progress Notes (Signed)
Subjective:   Patient ID: Ricky Hayden male   DOB: 1945/08/31 70 y.o.   MRN: [id number]  HPI: [name] is a 70 y.o. male w/ PMHx of HTN, HLD, GERD, Gout, COPD, BPH, and CAD, presents to the clinic today for a follow-up visit for his LE swelling. Patient has had this issue for about 1 year now and was seen by his PCP in 03/2015 to address this issue. He feels that the left is worse than the right and has been significantly worse since after he stopped his HCTZ some time ago. Patient also admits to slow progressive weight gain, DOE, PND, and worsening orthopnea. States he now has to sleep in a recliner because he feels like he is choking when he lies flat. No chest pain. ECHO performed in 04/2015 showed EF of [id number] and mild diastolic dysfunction. Normal PAP. LE dopplers negative.   Past Medical History  Diagnosis Date  . Hyperlipidemia   . Hypertension   . Arthritis     osteoarthritis of left knee  . Sinusitis     s/p ethmoidectomy and nasal septoplasty  . GERD (gastroesophageal reflux disease)   . Hiatal hernia   . Gout   . 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 Intracranial hematoma ([place]) 1995    history of, s/p evacuation by Dr. Sherwood Gambler  . 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 Chronic joint pain   . Coronary artery disease   . PONV (postoperative nausea and vomiting)   . COPD (chronic obstructive pulmonary disease) ([place])   . Vertigo     intermitantly  . Balanitis     recurrent  . Nocturia   . Enlarged prostate    Current Outpatient Prescriptions  Medication Sig Dispense Refill  . albuterol (PROVENTIL HFA;VENTOLIN HFA) 108 (90 BASE) MCG/ACT inhaler Inhale 2 puffs into the lungs every 6 (six) hours as needed for wheezing. 1 Inhaler 11  . allopurinol (ZYLOPRIM) 100 MG tablet Take 2 tablets (200 mg total) by mouth daily. 60 tablet 11  . cloNIDine (CATAPRES) 0.3 MG tablet Take 1 tablet (0.3 mg total) by mouth 2  (two) times daily. 180 tablet 3  . clotrimazole (LOTRIMIN) 1 % cream Apply 1 application topically 2 (two) times daily as needed (rash).    . colchicine 0.6 MG tablet Take 1 tablet (0.6 mg total) by mouth 2 (two) times daily as needed. Use only during gout flare 60 tablet 11  . docusate sodium (COLACE) 100 MG capsule Take 1-2 capsules (100-200 mg total) by mouth 2 (two) times daily as needed for mild constipation. 180 capsule 3  . enalapril (VASOTEC) 20 MG tablet Take 2 tablets (40 mg total) by mouth every evening. 180 tablet 3  . finasteride (PROSCAR) 5 MG tablet TAKE ONE TABLET BY MOUTH ONCE DAILY 30 tablet 11  . furosemide (LASIX) 20 MG tablet Take 1 tablet (20 mg total) by mouth daily. 30 tablet 2  . hydrALAZINE (APRESOLINE) 25 MG tablet Take 2 tablets (50 mg total) by mouth 2 (two) times daily. 360 tablet 3  . hydrocortisone cream 1 % Apply 1 application topically 2 (two) times daily as needed for itching.    . mometasone-formoterol (DULERA) 100-5 MCG/ACT AERO Inhale 2 puffs into the lungs 2 (two) times daily. 1 Inhaler 11  . naproxen sodium (ANAPROX) 220 MG tablet Take 2 tablets (440 mg total) by mouth 2 (two) times daily as  needed (pain). 180 tablet 3  . omeprazole (PRILOSEC) 20 MG capsule Take 1 capsule (20 mg total) by mouth daily. 30 capsule 11  . PHENobarbital (LUMINAL) 64.8 MG tablet Take 1 tablet (64.8 mg total) by mouth 2 (two) times daily. 180 tablet 3  . phenylephrine (SUDAFED PE) 10 MG TABS tablet Take 10 mg by mouth every 4 (four) hours as needed. Congestion    . polyethylene glycol (MIRALAX / GLYCOLAX) packet Take 17 g by mouth daily as needed for moderate constipation. 14 each 0  . pravastatin (PRAVACHOL) 40 MG tablet Take 1 tablet (40 mg total) by mouth every evening. 90 tablet 3  . sildenafil (VIAGRA) 100 MG tablet Take 0.5 tablets (50 mg total) by mouth as needed for erectile dysfunction. 6 tablet 0  . tamsulosin (FLOMAX) 0.4 MG CAPS capsule Take 1 capsule (0.4 mg total) by  mouth daily. 90 capsule 3   No current facility-administered medications for this visit.    Review of Systems: General: Denies fever, chills, diaphoresis, appetite change and fatigue.  Respiratory: Positive for DOE, wheezing. Denies cough.   Cardiovascular: Denies chest pain and palpitations.  Gastrointestinal: Denies nausea, vomiting, abdominal pain, and diarrhea.  Genitourinary: Denies dysuria, increased frequency, and flank pain. Endocrine: Denies hot or cold intolerance, polyuria, and polydipsia. Musculoskeletal: Positive for LE edema. Denies myalgias, back pain, joint swelling, arthralgias and gait problem.  Skin: Denies pallor, rash and wounds.  Neurological: Denies dizziness, seizures, syncope, weakness, lightheadedness, numbness and headaches.  Psychiatric/Behavioral: Denies mood changes, and sleep disturbances.  Objective:   Physical Exam: Filed Vitals:   06/10/15 1351  BP: 142/77  Pulse: 78  Temp: 97.9 F (36.6 C)  TempSrc: Oral  Height: 5\' 7"  (1.702 m)  Weight: 245 lb 14.4 oz (111.54 kg)  SpO2: 97%    General: Obese male, alert, cooperative, NAD. HEENT: PERRL, EOMI. Moist mucus membranes Neck: Full range of motion without pain, supple, no lymphadenopathy or carotid bruits. JVD not appreciated given neck girth.  Lungs: Air entry equal bilaterally. Upper airway wheeze. Faint bibasilar rales. No rhonchi.  Heart: RRR, no murmurs, gallops, or rubs Abdomen: Soft, non-tender, non-distended, BS +. Pitting edema on abdominal pannus.  Extremities: No cyanosis, clubbing. +3 pitting edema extending to knees (L>R). Chronic venous stasis changes.  Neurologic: Alert & oriented x3, cranial nerves II-XII intact, strength grossly intact, sensation intact to light touch   Assessment & Plan:   Please see problem based assessment and plan.

## 2015-06-11 ENCOUNTER — Encounter (HOSPITAL_BASED_OUTPATIENT_CLINIC_OR_DEPARTMENT_OTHER): Payer: Commercial Managed Care - PPO

## 2015-06-11 LAB — BMP8+ANION GAP
ANION GAP: 18 mmol/L (ref 10.0–18.0)
BUN / CREAT RATIO: 12 (ref 10–22)
BUN: 12 mg/dL (ref 8–27)
CHLORIDE: 102 mmol/L (ref 96–106)
CO2: 25 mmol/L (ref 18–29)
CREATININE: 0.97 mg/dL (ref 0.76–1.27)
Calcium: 9.1 mg/dL (ref 8.6–10.2)
GFR calc non Af Amer: 78 mL/min/{1.73_m2} (ref >59)
GFR, EST AFRICAN AMERICAN: 90 mL/min/{1.73_m2} (ref >59)
Glucose: 93 mg/dL (ref 65–99)
POTASSIUM: 4.4 mmol/L (ref 3.5–5.2)
SODIUM: 145 mmol/L — AB (ref 134–144)

## 2015-06-11 NOTE — Assessment & Plan Note (Addendum)
ECHO performed in 04/2015 shows EF of [id number], mild diastolic dysfunction, but diffuse hypokinesis. NO elevated PAP. I find this relatively mild pathology on his ECHO to be rather surprising given his presentation with DOE, PND, significant orthopnea, basilar rales on exam, significant pitting edema on the LE's and abdominal pannus, and weight changes, suspect that this is most likely the result of CHF. Also states his LE swelling worsened after he stopped his HCTZ over a year ago. NOT ON NORVASC, which could explain LE swelling by itself. Previous workup including UA (NO protein), LFT's, and LE dopplers, there is no other clear explanation for his symptoms; do not suspect cirrhosis, nephrotic syndrome, or DVT.  -Will add Lasix 20 mg daily for now to determine if this causes improvement -Advised patient check weight at home daily to assess if appropriate weight change -RTC in 1 week. Check BNP at that time (forgot to order today) -Continue statin, ACEI + hydralazine, clonidine for BP control

## 2015-06-11 NOTE — Assessment & Plan Note (Addendum)
Patient has bilateral LE swelling, L>R. Suspect this is related to CHF, please see chronic combined CHF for related assessment and plan. No DVT on dopplers, no other explanation for continued LE swelling. May be component of venous stasis, but still feel this is most likely related to heart failure. Not on CCB, which could explain LE swelling by itself.  -Add Lasix 20 mg daily for now -RTC in 1 week.

## 2015-06-13 NOTE — Progress Notes (Signed)
Internal Medicine Clinic Attending  Case discussed with Dr. Jones at the time of the visit.  We reviewed the resident's history and exam and pertinent patient test results.  I agree with the assessment, diagnosis, and plan of care documented in the resident's note.  

## 2015-06-17 ENCOUNTER — Encounter: Payer: Self-pay | Admitting: Internal Medicine

## 2015-06-17 ENCOUNTER — Ambulatory Visit (HOSPITAL_COMMUNITY)
Admission: RE | Admit: 2015-06-17 | Discharge: 2015-06-17 | Disposition: A | Discharge status: Home / Self Care | Payer: Commercial Managed Care - PPO | Source: Ambulatory Visit | Attending: Internal Medicine | Admitting: Internal Medicine

## 2015-06-17 ENCOUNTER — Ambulatory Visit (INDEPENDENT_AMBULATORY_CARE_PROVIDER_SITE_OTHER): Payer: Commercial Managed Care - PPO | Admitting: Internal Medicine

## 2015-06-17 VITALS — BP 154/71 | HR 77 | Temp 98.0°F | Ht 67.0 in | Wt 246.9 lb

## 2015-06-17 DIAGNOSIS — I110 Hypertensive heart disease with heart failure: Secondary | ICD-10-CM

## 2015-06-17 DIAGNOSIS — R0602 Shortness of breath: Secondary | ICD-10-CM | POA: Diagnosis present

## 2015-06-17 DIAGNOSIS — I5042 Chronic combined systolic (congestive) and diastolic (congestive) heart failure: Secondary | ICD-10-CM | POA: Insufficient documentation

## 2015-06-17 DIAGNOSIS — J449 Chronic obstructive pulmonary disease, unspecified: Secondary | ICD-10-CM | POA: Insufficient documentation

## 2015-06-17 DIAGNOSIS — I517 Cardiomegaly: Secondary | ICD-10-CM | POA: Insufficient documentation

## 2015-06-17 DIAGNOSIS — R05 Cough: Secondary | ICD-10-CM

## 2015-06-17 DIAGNOSIS — M7989 Other specified soft tissue disorders: Secondary | ICD-10-CM

## 2015-06-17 DIAGNOSIS — I10 Essential (primary) hypertension: Secondary | ICD-10-CM

## 2015-06-17 DIAGNOSIS — R918 Other nonspecific abnormal finding of lung field: Secondary | ICD-10-CM | POA: Diagnosis not present

## 2015-06-17 DIAGNOSIS — R058 Other specified cough: Secondary | ICD-10-CM

## 2015-06-17 DIAGNOSIS — R0989 Other specified symptoms and signs involving the circulatory and respiratory systems: Secondary | ICD-10-CM | POA: Diagnosis not present

## 2015-06-17 MED ORDER — FUROSEMIDE 20 MG PO TABS: 40.0000 mg | ORAL_TABLET | Freq: Two times a day (BID) | ORAL | Status: DC

## 2015-06-17 MED ORDER — CARVEDILOL 6.25 MG PO TABS: 6.2500 mg | ORAL_TABLET | Freq: Two times a day (BID) | ORAL | Status: DC

## 2015-06-17 NOTE — Patient Instructions (Signed)
1. Please make a follow up appointment for 1 week.   2. Please take all medications as previously prescribed with the following changes:  Start taking Lasix 40 mg twice daily for 3 days, then go to 40 mg daily until seen in the clinic again.   Start Coreg 6.25 mg twice daily. This will help your heart and blood pressure. Hopefully then we can taper off your Hydralazine and see if this improves your leg swelling.   3. If you have worsening of your symptoms or new symptoms arise, please call the clinic [phone] or go to the ER immediately if symptoms are severe.  You have done a great job in taking all your medications. Please continue to do this.

## 2015-06-17 NOTE — Progress Notes (Signed)
Subjective:   Patient ID: Ricky Hayden male   DOB: May 28, 1945 70 y.o.   MRN: [id number]  HPI: [name] is a 70 y.o. male w/ PMHx of HTN, HLD, GERD, Gout, COPD, BPH, and CAD, presents to the clinic today for a follow-up visit for his LE swelling. Seen last week in the clinic on 06/10/14 after ECHO showed mild combined CHF, started on Lasix 20 mg daily. Since that time, he has not diuresed much, still has significant LE edema. His biggest complaint today as that of upper respiratory issues, including cough, congestion, and mild SOB. He is generally feeling under the weather. No fever or chills.   Past Medical History  Diagnosis Date  . Hyperlipidemia   . Hypertension   . Arthritis     osteoarthritis of left knee  . Sinusitis     s/p ethmoidectomy and nasal septoplasty  . GERD (gastroesophageal reflux disease)   . Hiatal hernia   . Gout   . 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 Intracranial hematoma ([place]) 1995    history of, s/p evacuation by Dr. Sherwood Gambler  . 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 Chronic joint pain   . Coronary artery disease   . PONV (postoperative nausea and vomiting)   . COPD (chronic obstructive pulmonary disease) ([place])   . Vertigo     intermitantly  . Balanitis     recurrent  . Nocturia   . Enlarged prostate    Current Outpatient Prescriptions  Medication Sig Dispense Refill  . albuterol (PROVENTIL HFA;VENTOLIN HFA) 108 (90 BASE) MCG/ACT inhaler Inhale 2 puffs into the lungs every 6 (six) hours as needed for wheezing. 1 Inhaler 11  . allopurinol (ZYLOPRIM) 100 MG tablet Take 2 tablets (200 mg total) by mouth daily. 60 tablet 11  . carvedilol (COREG) 6.25 MG tablet Take 1 tablet (6.25 mg total) by mouth 2 (two) times daily. 60 tablet 2  . cloNIDine (CATAPRES) 0.3 MG tablet Take 1 tablet (0.3 mg total) by mouth 2 (two) times daily. 180 tablet 3  . clotrimazole (LOTRIMIN) 1 % cream Apply 1  application topically 2 (two) times daily as needed (rash).    . colchicine 0.6 MG tablet Take 1 tablet (0.6 mg total) by mouth 2 (two) times daily as needed. Use only during gout flare 60 tablet 11  . docusate sodium (COLACE) 100 MG capsule Take 1-2 capsules (100-200 mg total) by mouth 2 (two) times daily as needed for mild constipation. 180 capsule 3  . enalapril (VASOTEC) 20 MG tablet Take 2 tablets (40 mg total) by mouth every evening. 180 tablet 3  . finasteride (PROSCAR) 5 MG tablet TAKE ONE TABLET BY MOUTH ONCE DAILY 30 tablet 11  . furosemide (LASIX) 20 MG tablet Take 2 tablets (40 mg total) by mouth 2 (two) times daily. 30 tablet 2  . guaiFENesin (MUCINEX) 600 MG 12 hr tablet Take 1 tablet (600 mg total) by mouth 2 (two) times daily. 30 tablet 0  . hydrALAZINE (APRESOLINE) 25 MG tablet Take 2 tablets (50 mg total) by mouth 2 (two) times daily. 360 tablet 3  . hydrocortisone cream 1 % Apply 1 application topically 2 (two) times daily as needed for itching.    . mometasone-formoterol (DULERA) 100-5 MCG/ACT AERO Inhale 2 puffs into the lungs 2 (two) times daily. 1 Inhaler 11  . naproxen sodium (ANAPROX) 220 MG tablet Take 2 tablets (  440 mg total) by mouth 2 (two) times daily as needed (pain). 180 tablet 3  . omeprazole (PRILOSEC) 20 MG capsule Take 1 capsule (20 mg total) by mouth daily. 30 capsule 11  . PHENobarbital (LUMINAL) 64.8 MG tablet Take 1 tablet (64.8 mg total) by mouth 2 (two) times daily. 180 tablet 3  . phenylephrine (SUDAFED PE) 10 MG TABS tablet Take 10 mg by mouth every 4 (four) hours as needed. Congestion    . polyethylene glycol (MIRALAX / GLYCOLAX) packet Take 17 g by mouth daily as needed for moderate constipation. 14 each 0  . pravastatin (PRAVACHOL) 40 MG tablet Take 1 tablet (40 mg total) by mouth every evening. 90 tablet 3  . sildenafil (VIAGRA) 100 MG tablet Take 0.5 tablets (50 mg total) by mouth as needed for erectile dysfunction. 6 tablet 0  . tamsulosin (FLOMAX)  0.4 MG CAPS capsule Take 1 capsule (0.4 mg total) by mouth daily. 90 capsule 3   No current facility-administered medications for this visit.    Review of Systems: General: Denies fever, chills, diaphoresis, appetite change and fatigue.  Respiratory: Positive for DOE, dry cough, wheezing.  Cardiovascular: Denies chest pain and palpitations.  Gastrointestinal: Denies nausea, vomiting, abdominal pain, and diarrhea.  Genitourinary: Denies dysuria, increased frequency, and flank pain. Endocrine: Denies hot or cold intolerance, polyuria, and polydipsia. Musculoskeletal: Positive for LE edema. Denies myalgias, back pain, joint swelling, arthralgias and gait problem.  Skin: Denies pallor, rash and wounds.  Neurological: Denies dizziness, seizures, syncope, weakness, lightheadedness, numbness and headaches.  Psychiatric/Behavioral: Denies mood changes, and sleep disturbances.  Objective:   Physical Exam: Filed Vitals:   06/17/15 1348  BP: 154/71  Pulse: 77  Temp: 98 F (36.7 C)  TempSrc: Oral  Height: 5\' 7"  (1.702 m)  Weight: 246 lb 14.4 oz (111.993 kg)  SpO2: 99%    General: Obese male, alert, cooperative, NAD. HEENT: PERRL, EOMI. Moist mucus membranes Neck: Full range of motion without pain, supple, no lymphadenopathy or carotid bruits. JVD not appreciated given neck girth.  Lungs: Air entry equal bilaterally. Rhonchi in the left base. No wheezes or rales.  Heart: RRR, no murmurs, gallops, or rubs Abdomen: Soft, non-tender, non-distended, BS +. Pitting edema on abdominal pannus.  Extremities: No cyanosis, clubbing. +3 pitting edema extending to knees (L>R). Chronic venous stasis changes.  Neurologic: Alert & oriented x3, cranial nerves II-XII intact, strength grossly intact, sensation intact to light touch   Assessment & Plan:   Please see problem based assessment and plan.

## 2015-06-18 DIAGNOSIS — R058 Other specified cough: Secondary | ICD-10-CM | POA: Insufficient documentation

## 2015-06-18 DIAGNOSIS — R05 Cough: Secondary | ICD-10-CM | POA: Insufficient documentation

## 2015-06-18 LAB — BMP8+ANION GAP
ANION GAP: 20 mmol/L — AB (ref 10.0–18.0)
BUN/Creatinine Ratio: 9 — ABNORMAL LOW (ref 10–22)
BUN: 8 mg/dL (ref 8–27)
CO2: 25 mmol/L (ref 18–29)
CREATININE: 0.86 mg/dL (ref 0.76–1.27)
Calcium: 9.3 mg/dL (ref 8.6–10.2)
Chloride: 101 mmol/L (ref 96–106)
GFR calc Af Amer: 101 mL/min/{1.73_m2} (ref >59)
GFR, EST NON AFRICAN AMERICAN: 87 mL/min/{1.73_m2} (ref >59)
Glucose: 83 mg/dL (ref 65–99)
Potassium: 4.9 mmol/L (ref 3.5–5.2)
SODIUM: 146 mmol/L — AB (ref 134–144)

## 2015-06-18 MED ORDER — GUAIFENESIN ER 600 MG PO TB12: 600.0000 mg | ORAL_TABLET | Freq: Two times a day (BID) | ORAL | Status: AC

## 2015-06-18 NOTE — Assessment & Plan Note (Signed)
BP Readings from Last 3 Encounters:  06/17/15 154/71  06/10/15 142/77  04/01/15 161/85    Lab Results  Component Value Date   NA 146* 06/17/2015   K 4.9 06/17/2015   CREATININE 0.86 06/17/2015    Assessment: Blood pressure control:  Elevated.  Comments: Takes Hydralazine 50 mg bid, Enalapril 40 mg daily, and Clonidine 0.3 mg bid.  Plan: Medications:  Continue current medications. Start Coreg 6.25 mg bid for BP and CHF benefit. Hopeful to titrate hydralazine off to determine if this is contributing to his edema.  Educational resources provided: brochure Self management tools provided: home blood pressure logbook Other plans: RTC in 1 week.

## 2015-06-18 NOTE — Assessment & Plan Note (Signed)
Started Lasix 20 mg daily last week. Patient with minimal diuresis. Weight generally unchanged, no decrease in LE swelling. Still with mild DOE and SOB, but wonder if this is related to viral URI as described. Repeat BMP looks okay, Cr stable. Does have borderline hypernatremia, therefore, wonder if some of LE swelling is due to Hydralazine rather than actual CHF related edema. Says he has taken this medication for some time, per chart review, since 2012 or 2013. Started having LE swelling 1 year ago. Still with +3 pitting edema, some in his abdominal pannus. Once again, No pain in the legs, LFT's and albumin normal, no protein in the urine, therefore do not suspect decreased oncotic pressure, liver disease, or nephrotic syndrome that would better explain his swelling. Previous dopplers negative for DVT.  -Increase Lasix 40 mg bid for 3 days only, then go back to 40 mg daily until follow up next week.  -Start Coreg for CHF and BP control with the hopes to eventually discontinue Hydralazine at some point, however, do not think his BP would tolerate stopping this completely at this time.

## 2015-06-18 NOTE — Assessment & Plan Note (Signed)
Patient describing symptoms of dry cough, congestion, DOE. Suspect this may be related to viral URI. On exam, patient does have rhonchi in the left base, however, this may be transmitted sound from upper airway. No obvious wheezes heard, no rales. CXR performed, images reviewed myself, no significant infiltrate appreciated, cardiomegaly noted.  -Increase Lasix to 40 mg bid as above -Continue inhalers for COPD -Mucinex prn -RTC in 1 week

## 2015-06-18 NOTE — Assessment & Plan Note (Signed)
Still with LE edema and SOB. CXR performed today only significant for cardiomegaly, no clear signs of edema. Wonder if some of his LE edema is 2/2 his Hydralazine as this is also known to cause edema similar to that of Norvasc. Still having some SOB/DOE.  -Increase Lasix to 40 mg bid for 3 days, then decrease to 40 mg daily.  -Start Coreg 6.25 mg bid for added heart failure benefit and blood pressure control -Can hopefully decrease Hydralazine at some point to determine if edema is somewhat contributed to by his hydralazine -RTC in 1 week -Repeat BMP at that time

## 2015-06-20 NOTE — Progress Notes (Signed)
Internal Medicine Clinic Attending  Case discussed with Dr. Jones soon after the resident saw the patient.  We reviewed the resident's history and exam and pertinent patient test results.  I agree with the assessment, diagnosis, and plan of care documented in the resident's note. 

## 2015-06-26 ENCOUNTER — Telehealth: Payer: Self-pay | Admitting: Internal Medicine

## 2015-06-26 NOTE — Telephone Encounter (Signed)
APPT. REMINDER CALL, LMTCB IF HE NEEDS TO CANCEL °

## 2015-06-27 ENCOUNTER — Encounter: Payer: Self-pay | Admitting: Internal Medicine

## 2015-06-27 ENCOUNTER — Ambulatory Visit (INDEPENDENT_AMBULATORY_CARE_PROVIDER_SITE_OTHER): Payer: Commercial Managed Care - PPO | Admitting: Internal Medicine

## 2015-06-27 VITALS — BP 147/68 | HR 70 | Temp 97.3°F | Ht 67.0 in | Wt 246.4 lb

## 2015-06-27 DIAGNOSIS — Z79899 Other long term (current) drug therapy: Secondary | ICD-10-CM

## 2015-06-27 DIAGNOSIS — I5042 Chronic combined systolic (congestive) and diastolic (congestive) heart failure: Secondary | ICD-10-CM

## 2015-06-27 DIAGNOSIS — I110 Hypertensive heart disease with heart failure: Secondary | ICD-10-CM

## 2015-06-27 DIAGNOSIS — M7989 Other specified soft tissue disorders: Secondary | ICD-10-CM

## 2015-06-27 DIAGNOSIS — J449 Chronic obstructive pulmonary disease, unspecified: Secondary | ICD-10-CM | POA: Diagnosis not present

## 2015-06-27 DIAGNOSIS — I10 Essential (primary) hypertension: Secondary | ICD-10-CM

## 2015-06-27 DIAGNOSIS — Z7951 Long term (current) use of inhaled steroids: Secondary | ICD-10-CM

## 2015-06-27 MED ORDER — CARVEDILOL 12.5 MG PO TABS: 12.5000 mg | ORAL_TABLET | Freq: Two times a day (BID) | ORAL | Status: DC

## 2015-06-27 MED ORDER — TIOTROPIUM BROMIDE MONOHYDRATE 18 MCG IN CAPS: 18.0000 ug | ORAL_CAPSULE | Freq: Every day | RESPIRATORY_TRACT | Status: DC

## 2015-06-27 NOTE — Progress Notes (Signed)
Subjective:   Patient ID: Ricky Hayden male   DOB: 1946-01-11 70 y.o.   MRN: [id number]  HPI: Ricky Hayden is a 70 y.o. male w/ PMHx of HTN, HLD, GERD, Gout, COPD, BPH, and CAD, presents to the clinic today for a follow-up visit for his LE swelling. Over the last few weeks, we have added Lasix to attempt to decrease LE swelling and added Coreg to attempt to better control his BP in order to eventually decrease/discontinue Hydralazine given that this also may be contributing to his LE swelling. Since his last visit, the patient's weight has only decreased ~1 lb and his LE swelling has only improved slightly. Last visit, a CXR was performed which was not significant for pulmonary edema, effusions, or vascular congestion, only cardiomegaly. He continues to complain about his breathing and has to stop after relatively short distances to catch his breath. He continues to wheeze on exam. He is currently using Albuterol prn and Dulera.    Past Medical History  Diagnosis Date  . Hyperlipidemia   . Hypertension   . Arthritis     osteoarthritis of left knee  . Sinusitis     s/p ethmoidectomy and nasal septoplasty  . GERD (gastroesophageal reflux disease)   . Hiatal hernia   . Gout   . 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 Intracranial hematoma ([place]) 1995    history of, s/p evacuation by Dr. Sherwood Gambler  . 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 Chronic joint pain   . Coronary artery disease   . PONV (postoperative nausea and vomiting)   . COPD (chronic obstructive pulmonary disease) ([place])   . Vertigo     intermitantly  . Balanitis     recurrent  . Nocturia   . Enlarged prostate    Current Outpatient Prescriptions  Medication Sig Dispense Refill  . albuterol (PROVENTIL HFA;VENTOLIN HFA) 108 (90 BASE) MCG/ACT inhaler Inhale 2 puffs into the lungs every 6 (six) hours as needed for wheezing. 1 Inhaler 11  . allopurinol (ZYLOPRIM)  100 MG tablet Take 2 tablets (200 mg total) by mouth daily. 60 tablet 11  . carvedilol (COREG) 12.5 MG tablet Take 1 tablet (12.5 mg total) by mouth 2 (two) times daily with a meal. 60 tablet 2  . cloNIDine (CATAPRES) 0.3 MG tablet Take 1 tablet (0.3 mg total) by mouth 2 (two) times daily. 180 tablet 3  . colchicine 0.6 MG tablet Take 1 tablet (0.6 mg total) by mouth 2 (two) times daily as needed. Use only during gout flare 60 tablet 11  . docusate sodium (COLACE) 100 MG capsule Take 1-2 capsules (100-200 mg total) by mouth 2 (two) times daily as needed for mild constipation. 180 capsule 3  . enalapril (VASOTEC) 20 MG tablet Take 2 tablets (40 mg total) by mouth every evening. 180 tablet 3  . finasteride (PROSCAR) 5 MG tablet TAKE ONE TABLET BY MOUTH ONCE DAILY 30 tablet 11  . furosemide (LASIX) 20 MG tablet Take 1 tablet (20 mg total) by mouth daily. 30 tablet 2  . guaiFENesin (MUCINEX) 600 MG 12 hr tablet Take 1 tablet (600 mg total) by mouth 2 (two) times daily. 30 tablet 0  . hydrALAZINE (APRESOLINE) 25 MG tablet Take 2 tablets (50 mg total) by mouth 2 (two) times daily. 360 tablet 3  . mometasone-formoterol (DULERA) 100-5 MCG/ACT AERO Inhale 2 puffs into the lungs 2 (two) times  daily. 1 Inhaler 11  . omeprazole (PRILOSEC) 20 MG capsule Take 1 capsule (20 mg total) by mouth daily. 30 capsule 11  . PHENobarbital (LUMINAL) 64.8 MG tablet Take 1 tablet (64.8 mg total) by mouth 2 (two) times daily. 180 tablet 3  . polyethylene glycol (MIRALAX / GLYCOLAX) packet Take 17 g by mouth daily as needed for moderate constipation. 14 each 0  . pravastatin (PRAVACHOL) 40 MG tablet Take 1 tablet (40 mg total) by mouth every evening. 90 tablet 3  . sildenafil (VIAGRA) 100 MG tablet Take 0.5 tablets (50 mg total) by mouth as needed for erectile dysfunction. 6 tablet 0  . tamsulosin (FLOMAX) 0.4 MG CAPS capsule Take 1 capsule (0.4 mg total) by mouth daily. 90 capsule 3  . tiotropium (SPIRIVA HANDIHALER) 18 MCG  inhalation capsule Place 1 capsule (18 mcg total) into inhaler and inhale daily. 30 capsule 2   No current facility-administered medications for this visit.    Review of Systems: General: Denies fever, chills, diaphoresis, appetite change and fatigue.  Respiratory: Positive for DOE, dry cough, wheezing.  Cardiovascular: Denies chest pain and palpitations.  Gastrointestinal: Denies nausea, vomiting, abdominal pain, and diarrhea.  Genitourinary: Denies dysuria, increased frequency, and flank pain. Endocrine: Denies hot or cold intolerance, polyuria, and polydipsia. Musculoskeletal: Positive for LE edema. Denies myalgias, back pain, joint swelling, arthralgias and gait problem.  Skin: Denies pallor, rash and wounds.  Neurological: Denies dizziness, seizures, syncope, weakness, lightheadedness, numbness and headaches.  Psychiatric/Behavioral: Denies mood changes, and sleep disturbances.  Objective:   Physical Exam: Filed Vitals:   06/27/15 1520  BP: 147/68  Pulse: 70  Temp: 97.3 F (36.3 C)  TempSrc: Oral  Height: 5\' 7"  (1.702 m)  Weight: 246 lb 6.4 oz (111.766 kg)  SpO2: 97%    General: Obese male, alert, cooperative, NAD. HEENT: PERRL, EOMI. Moist mucus membranes Neck: Full range of motion without pain, supple, no lymphadenopathy or carotid bruits. JVD not appreciated given neck girth.  Lungs: Air entry equal bilaterally. Rhonchi in the left base. No wheezes or rales.  Heart: RRR, no murmurs, gallops, or rubs Abdomen: Soft, non-tender, non-distended, BS +. Mild pitting edema on abdominal pannus.  Extremities: No cyanosis, clubbing. +3 pitting edema extending to knees (L>R). Chronic venous stasis changes.  Neurologic: Alert & oriented x3, cranial nerves II-XII intact, strength grossly intact, sensation intact to light touch   Assessment & Plan:   Please see problem based assessment and plan.

## 2015-06-27 NOTE — Patient Instructions (Signed)
1. Please make a follow up appointment in 2 weeks.   2. Please take all medications as previously prescribed with the following changes:  Start using Spiriva inhaler once daily.   Increase Coreg to 12.5 mg twice daily. I have sent in new prescription to your pharmacy.   3. If you have worsening of your symptoms or new symptoms arise, please call the clinic [phone]), or go to the ER immediately if symptoms are severe.  You have done a great job in taking all your medications. Please continue to do this.

## 2015-06-28 ENCOUNTER — Other Ambulatory Visit: Payer: Self-pay

## 2015-06-28 DIAGNOSIS — M7989 Other specified soft tissue disorders: Secondary | ICD-10-CM

## 2015-06-28 DIAGNOSIS — I5042 Chronic combined systolic (congestive) and diastolic (congestive) heart failure: Secondary | ICD-10-CM

## 2015-06-28 LAB — BMP8+ANION GAP
ANION GAP: 15 mmol/L (ref 10.0–18.0)
BUN/Creatinine Ratio: 14 (ref 10–22)
BUN: 14 mg/dL (ref 8–27)
CALCIUM: 9.8 mg/dL (ref 8.6–10.2)
CHLORIDE: 102 mmol/L (ref 96–106)
CO2: 27 mmol/L (ref 18–29)
Creatinine, Ser: 1.02 mg/dL (ref 0.76–1.27)
GFR calc non Af Amer: 74 mL/min/{1.73_m2} (ref >59)
GFR, EST AFRICAN AMERICAN: 85 mL/min/{1.73_m2} (ref >59)
GLUCOSE: 114 mg/dL — AB (ref 65–99)
POTASSIUM: 4.5 mmol/L (ref 3.5–5.2)
Sodium: 144 mmol/L (ref 134–144)

## 2015-06-28 MED ORDER — FUROSEMIDE 20 MG PO TABS: 20.0000 mg | ORAL_TABLET | Freq: Every day | ORAL | Status: DC

## 2015-06-28 NOTE — Telephone Encounter (Signed)
Can you change to 30 day supply per patients mychart request.

## 2015-06-28 NOTE — Assessment & Plan Note (Signed)
After increased dose of Lasix, LE swelling only mildly improved. Think this is most likely related to hydralazine and probably some underlying venous stasis as well. Legs are not quite as swollen today.  -Continue Lasix 20 mg daily -Increase Coreg to 12.5 mg bid in addition to other BP medications. -If BP is improved at next visit, would try and titrate off hydralazine

## 2015-06-28 NOTE — Assessment & Plan Note (Signed)
BP Readings from Last 3 Encounters:  06/27/15 147/68  06/17/15 154/71  06/10/15 142/77    Lab Results  Component Value Date   NA 144 06/27/2015   K 4.5 06/27/2015   CREATININE 1.02 06/27/2015    Assessment: Blood pressure control:  Mild elevation Progress toward BP goal:   Improved Comments: Currently on Coreg 6.25 mg bid, Hydralazine 50 mg bid, Clonidine 0.3 mg bid, and Enalapril 40 mg daily. Also Lasix 40 mg daily currently.   Plan: Medications:  Increase Coreg to 12.5 mg bid. Hope to one day discontinue Hydralazine given his LE edema. Decrease Lasix to 20 mg daily.  Educational resources provided: brochure (denies) Self management tools provided: home blood pressure logbook Other plans: RTC in 2 weeks.

## 2015-06-28 NOTE — Assessment & Plan Note (Signed)
Patient with continuous wheezing on exam, says his breathing is causing him trouble lately only with minimal exertion. SpO2 97% on RA. Previous PFT's significant for FEV1/FVC of 59% and FEV1 of 46%. Has been using Dulera and prn albuterol. Most recent CXR only shows mild hyperinflation, therefore do not think his mild CHF is contributing to his breathing at this time. Given his limitations, suggested he see pulmonologist for possible repeat PFT's however he and his wife are not interested at this time given the cost of seeing a specialist.  -Continue Dulera bid + Albuterol prn -Start Spiriva daily  -Will continue to discuss pulmonary follow up in the future for repeat PFT's

## 2015-06-28 NOTE — Progress Notes (Signed)
Medicine attending: Medical history, presenting problems, physical findings, and medications, reviewed with resident physician Dr Eden Jones on the day of the patient visit and I concur with his evaluation and management plan. 

## 2015-06-28 NOTE — Assessment & Plan Note (Signed)
Besides LE swelling, patient appears to be generally euvolemic. Feel that LE swelling is now mostly related to vasodilator. Lungs clear on exam, most recent CXR with no bibasilar edema, or vascular congestion. Says his breathing is not good, think that this is mostly related to his COPD.  -Increase Coreg to 12.5 mg bid for improved BP control -Continue Enalapril 40 mg daily -Decrease Lasix to 20 mg daily for now.

## 2015-07-04 ENCOUNTER — Encounter: Payer: Self-pay | Admitting: Internal Medicine

## 2015-07-23 ENCOUNTER — Telehealth: Payer: Self-pay | Admitting: Internal Medicine

## 2015-07-23 NOTE — Telephone Encounter (Signed)
APPT. REMINDER CALL, LMTCB °

## 2015-07-24 ENCOUNTER — Ambulatory Visit (INDEPENDENT_AMBULATORY_CARE_PROVIDER_SITE_OTHER): Payer: Commercial Managed Care - PPO | Admitting: Internal Medicine

## 2015-07-24 VITALS — BP 149/74 | HR 76 | Temp 98.0°F | Ht 67.0 in | Wt 251.3 lb

## 2015-07-24 DIAGNOSIS — I5042 Chronic combined systolic (congestive) and diastolic (congestive) heart failure: Secondary | ICD-10-CM | POA: Diagnosis not present

## 2015-07-24 DIAGNOSIS — M7989 Other specified soft tissue disorders: Secondary | ICD-10-CM | POA: Diagnosis not present

## 2015-07-24 DIAGNOSIS — J449 Chronic obstructive pulmonary disease, unspecified: Secondary | ICD-10-CM

## 2015-07-24 DIAGNOSIS — I110 Hypertensive heart disease with heart failure: Secondary | ICD-10-CM

## 2015-07-24 DIAGNOSIS — I10 Essential (primary) hypertension: Secondary | ICD-10-CM

## 2015-07-24 DIAGNOSIS — Z7951 Long term (current) use of inhaled steroids: Secondary | ICD-10-CM

## 2015-07-24 DIAGNOSIS — Z79899 Other long term (current) drug therapy: Secondary | ICD-10-CM

## 2015-07-24 DIAGNOSIS — S069X9S Unspecified intracranial injury with loss of consciousness of unspecified duration, sequela: Secondary | ICD-10-CM

## 2015-07-24 DIAGNOSIS — Z87820 Personal history of traumatic brain injury: Secondary | ICD-10-CM

## 2015-07-24 MED ORDER — CARVEDILOL 25 MG PO TABS
25.0000 mg | ORAL_TABLET | Freq: Two times a day (BID) | ORAL | Status: DC
Start: 2015-07-24 — End: 2015-11-13

## 2015-07-24 MED ORDER — FUROSEMIDE 20 MG PO TABS: 20.0000 mg | ORAL_TABLET | Freq: Two times a day (BID) | ORAL | Status: DC

## 2015-07-24 MED ORDER — PHENOBARBITAL 64.8 MG PO TABS: 64.8000 mg | ORAL_TABLET | Freq: Two times a day (BID) | ORAL | Status: DC

## 2015-07-24 NOTE — Patient Instructions (Signed)
1. Please return for follow up in 6 weeks.   2. Please take all medications as previously prescribed with the following changes:  Increase Coreg to 25 mg twice daily.   Continue other medications as previously prescribed.   Continue Spiriva and Dulera.   Continue Measuring weight and BP at home.   3. If you have worsening of your symptoms or new symptoms arise, please call the clinic [phone] or go to the ER immediately if symptoms are severe.

## 2015-07-24 NOTE — Progress Notes (Signed)
Subjective:   Patient ID: Ricky Hayden male   DOB: [birth year] 70 y.o.   MRN: [id number]  HPI: Ricky Hayden is a 70 y.o. male w/ PMHx of HTN, HLD, GERD, Gout, COPD, BPH, and CAD, presents to the clinic today for a follow-up visit for his LE swelling. Patient was last seen a few weeks ago for management of his LE swelling, BP, and SOB. At that time, dose of Coreg was increased with hopes of decreasing Hydralazine (think this is related to his LE swelling) and added Spiriva given his SOB with exertion. Previous CXR normal. Weight is stable today (increased per our scale, however, wife recorded stable weight at home for the past 2 weeks). Leg swelling still an issue. Breathing improved with Spiriva. BP mildly elevated. Patient has been taking Lasix 20 mg bid (instructed to take 20 daily at last visit), says he is tolerating this dose.     Current Outpatient Prescriptions  Medication Sig Dispense Refill  . albuterol (PROVENTIL HFA;VENTOLIN HFA) 108 (90 BASE) MCG/ACT inhaler Inhale 2 puffs into the lungs every 6 (six) hours as needed for wheezing. 1 Inhaler 11  . allopurinol (ZYLOPRIM) 100 MG tablet Take 2 tablets (200 mg total) by mouth daily. 60 tablet 11  . carvedilol (COREG) 12.5 MG tablet Take 1 tablet (12.5 mg total) by mouth 2 (two) times daily with a meal. 60 tablet 2  . cloNIDine (CATAPRES) 0.3 MG tablet Take 1 tablet (0.3 mg total) by mouth 2 (two) times daily. 180 tablet 3  . colchicine 0.6 MG tablet Take 1 tablet (0.6 mg total) by mouth 2 (two) times daily as needed. Use only during gout flare 60 tablet 11  . docusate sodium (COLACE) 100 MG capsule Take 1-2 capsules (100-200 mg total) by mouth 2 (two) times daily as needed for mild constipation. 180 capsule 3  . enalapril (VASOTEC) 20 MG tablet Take 2 tablets (40 mg total) by mouth every evening. 180 tablet 3  . finasteride (PROSCAR) 5 MG tablet TAKE ONE TABLET BY MOUTH ONCE DAILY 30 tablet 11  . furosemide (LASIX) 20 MG tablet Take  1 tablet (20 mg total) by mouth daily. 30 tablet 2  . guaiFENesin (MUCINEX) 600 MG 12 hr tablet Take 1 tablet (600 mg total) by mouth 2 (two) times daily. 30 tablet 0  . hydrALAZINE (APRESOLINE) 25 MG tablet Take 2 tablets (50 mg total) by mouth 2 (two) times daily. 360 tablet 3  . mometasone-formoterol (DULERA) 100-5 MCG/ACT AERO Inhale 2 puffs into the lungs 2 (two) times daily. 1 Inhaler 11  . omeprazole (PRILOSEC) 20 MG capsule Take 1 capsule (20 mg total) by mouth daily. 30 capsule 11  . PHENobarbital (LUMINAL) 64.8 MG tablet Take 1 tablet (64.8 mg total) by mouth 2 (two) times daily. 180 tablet 3  . polyethylene glycol (MIRALAX / GLYCOLAX) packet Take 17 g by mouth daily as needed for moderate constipation. 14 each 0  . pravastatin (PRAVACHOL) 40 MG tablet Take 1 tablet (40 mg total) by mouth every evening. 90 tablet 3  . sildenafil (VIAGRA) 100 MG tablet Take 0.5 tablets (50 mg total) by mouth as needed for erectile dysfunction. 6 tablet 0  . tamsulosin (FLOMAX) 0.4 MG CAPS capsule Take 1 capsule (0.4 mg total) by mouth daily. 90 capsule 3  . tiotropium (SPIRIVA HANDIHALER) 18 MCG inhalation capsule Place 1 capsule (18 mcg total) into inhaler and inhale daily. 30 capsule 2   No current facility-administered medications for this visit.  Review of Systems  General: Denies fever, diaphoresis, appetite change, and fatigue.  Respiratory: Positive for DOE. Denies cough, and wheezing.   Cardiovascular: Denies chest pain and palpitations.  Gastrointestinal: Denies nausea, vomiting, abdominal pain, and diarrhea Musculoskeletal: Positive for LE swelling. Denies myalgias, arthralgias, back pain, and gait problem.  Neurological: Denies dizziness, syncope, weakness, lightheadedness, and headaches.  Psychiatric/Behavioral: Denies mood changes, sleep disturbance, and agitation.   Objective:   Physical Exam: Filed Vitals:   07/24/15 1535  BP: 149/74  Pulse: 76  Temp: 98 F (36.7 C)    TempSrc: Oral  Height: 5\' 7"  (1.702 m)  Weight: 251 lb 4.8 oz (113.989 kg)  SpO2: 97%    General: Obese male, alert, cooperative, NAD. HEENT: PERRL, EOMI. Moist mucus membranes Neck: Full range of motion without pain, supple, no lymphadenopathy or carotid bruits. JVD not appreciated given neck girth.  Lungs: Air entry equal bilaterally. No rales or rhonchi. Faint scattered wheezes.  Heart: RRR, no murmurs, gallops, or rubs Abdomen: Obese. Soft, non-tender, non-distended, BS +.  Extremities: No cyanosis, clubbing. +3 pitting edema extending to knees (L>R). Chronic venous stasis changes.  Neurologic: Alert & oriented x3, cranial nerves II-XII intact, strength grossly intact, sensation intact to light touch   Assessment & Plan:   Please see problem based assessment and plan.

## 2015-07-25 ENCOUNTER — Encounter: Payer: Self-pay | Admitting: Gastroenterology

## 2015-07-25 NOTE — Assessment & Plan Note (Signed)
Appears euvolemic for the most part, aside from his extensive LE edema, although feel this is more associated with Hydralazine and venous stasis. Lungs clear on exam.  -Increase Coreg to 25 mg bid -Continue Lasix 20 mg bib -Enalapril 40 mg daily.

## 2015-07-25 NOTE — Progress Notes (Signed)
Internal Medicine Clinic Attending  Case discussed with Dr. Jones at the time of the visit.  We reviewed the resident's history and exam and pertinent patient test results.  I agree with the assessment, diagnosis, and plan of care documented in the resident's note.  

## 2015-07-25 NOTE — Assessment & Plan Note (Signed)
Refilled Phenobarbital

## 2015-07-25 NOTE — Assessment & Plan Note (Signed)
Patient states his breathing has improved with Spiriva. Only faint wheezes on exam. Think a lot of his breathing problems stem from his obesity as well.  -Continue Spiriva, Dulera, and albuterol prn -Discussed weight loss.

## 2015-07-25 NOTE — Assessment & Plan Note (Signed)
Still think this is primarily related to venous stasis and vasodilator effect of hydralazine. Still +3, L>R. Previous workup has been negative. Increased Lasix did not promote diuresis. Lungs clear on exam, CXR without effusion or edema, therefore do not think this is as much related to CHF. No DVT on doppler.  -Future goal is to discontinue Hydralazine, although BP is still elevated, would not tolerate stopping this yet. Increased COreg today, may need further addition of Spironolactone to improve BP control prior to stopping Hydralazine.  -Encouraged use of compression stockings. Says he will try this.

## 2015-07-25 NOTE — Assessment & Plan Note (Addendum)
BP still mildly elevated. Now on Lasix 20 mg bid, Enalapril 40 mg daily, Clonidine 0.3 mg bid, Coreg 12.5 mg bid, and Hydralazine 50 mg bid. Would like to discontinue Hydralazine eventually given his LE edema.  -Increase Coreg to 25 mg bid.  -Continue other medications as listed above.  -May need addition of Spironolactone next in the future if he continues to be elevated.

## 2015-09-16 ENCOUNTER — Telehealth: Payer: Self-pay | Admitting: Internal Medicine

## 2015-09-16 NOTE — Telephone Encounter (Signed)
APT. REMINDER CALL, LMTCB °

## 2015-09-17 ENCOUNTER — Encounter: Payer: Self-pay | Admitting: Internal Medicine

## 2015-09-17 ENCOUNTER — Ambulatory Visit (INDEPENDENT_AMBULATORY_CARE_PROVIDER_SITE_OTHER): Payer: Commercial Managed Care - PPO | Admitting: Internal Medicine

## 2015-09-17 VITALS — BP 156/78 | HR 71 | Temp 98.0°F | Ht 67.0 in | Wt 241.3 lb

## 2015-09-17 DIAGNOSIS — J449 Chronic obstructive pulmonary disease, unspecified: Secondary | ICD-10-CM | POA: Diagnosis not present

## 2015-09-17 DIAGNOSIS — M7989 Other specified soft tissue disorders: Secondary | ICD-10-CM

## 2015-09-17 DIAGNOSIS — R600 Localized edema: Secondary | ICD-10-CM

## 2015-09-17 DIAGNOSIS — I10 Essential (primary) hypertension: Secondary | ICD-10-CM

## 2015-09-17 MED ORDER — SPIRONOLACTONE 25 MG PO TABS: 25.0000 mg | ORAL_TABLET | Freq: Two times a day (BID) | ORAL | Status: DC

## 2015-09-17 MED ORDER — BUDESONIDE-FORMOTEROL FUMARATE 80-4.5 MCG/ACT IN AERO
2.0000 | INHALATION_SPRAY | Freq: Two times a day (BID) | RESPIRATORY_TRACT | Status: DC
Start: 2015-09-17 — End: 2021-10-10

## 2015-09-17 NOTE — Progress Notes (Signed)
Subjective:   Patient ID: Ricky Hayden male   DOB: 06/25/1945 70 y.o.   MRN: [id number]  HPI: [name] is a 70 y.o. male w/ PMHx of HTN, HLD, GERD, Gout, COPD, BPH, and CAD, presents to the clinic today for a follow-up visit for his LE swelling and HTN. Patient accompanied by his wife as always. Patient states he is doing about the same as his last clinic visit. Thera was a time a few weeks back that his LE swelling became so bad that the patient's wife stopped his Hydralazine. She states his swelling improved significantly after stopping that medication, but then did not get any better after a while. He continues to have bilateral swelling, worse in the left. He also complains of some mild left ankle swelling as well. No pain in the legs otherwise. No worsening SOB, no chest pain, palpitations, fever, chills, nausea, or vomiting. He does not keep his legs elevated. He has attempted to use short compression stockings but says that this did not help, almost made the situation worse according to the wife.   Patient also mentions that his steroid inhaler Ruthe Mannan) is upsetting his throat. He says every time he uses it recently, he has hoarseness and a rattle in his upper chest. He otherwise states that his breathing is at his baseline.       Current Outpatient Prescriptions  Medication Sig Dispense Refill  . albuterol (PROVENTIL HFA;VENTOLIN HFA) 108 (90 BASE) MCG/ACT inhaler Inhale 2 puffs into the lungs every 6 (six) hours as needed for wheezing. 1 Inhaler 11  . allopurinol (ZYLOPRIM) 100 MG tablet Take 2 tablets (200 mg total) by mouth daily. 60 tablet 11  . carvedilol (COREG) 25 MG tablet Take 1 tablet (25 mg total) by mouth 2 (two) times daily with a meal. 60 tablet 2  . cloNIDine (CATAPRES) 0.3 MG tablet Take 1 tablet (0.3 mg total) by mouth 2 (two) times daily. 180 tablet 3  . colchicine 0.6 MG tablet Take 1 tablet (0.6 mg total) by mouth 2 (two) times daily as needed. Use only during  gout flare 60 tablet 11  . docusate sodium (COLACE) 100 MG capsule Take 1-2 capsules (100-200 mg total) by mouth 2 (two) times daily as needed for mild constipation. 180 capsule 3  . enalapril (VASOTEC) 20 MG tablet Take 2 tablets (40 mg total) by mouth every evening. 180 tablet 3  . finasteride (PROSCAR) 5 MG tablet TAKE ONE TABLET BY MOUTH ONCE DAILY 30 tablet 11  . furosemide (LASIX) 20 MG tablet Take 1 tablet (20 mg total) by mouth 2 (two) times daily. 30 tablet 2  . guaiFENesin (MUCINEX) 600 MG 12 hr tablet Take 1 tablet (600 mg total) by mouth 2 (two) times daily. 30 tablet 0  . hydrALAZINE (APRESOLINE) 25 MG tablet Take 2 tablets (50 mg total) by mouth 2 (two) times daily. 360 tablet 3  . mometasone-formoterol (DULERA) 100-5 MCG/ACT AERO Inhale 2 puffs into the lungs 2 (two) times daily. 1 Inhaler 11  . omeprazole (PRILOSEC) 20 MG capsule Take 1 capsule (20 mg total) by mouth daily. 30 capsule 11  . PHENobarbital (LUMINAL) 64.8 MG tablet Take 1 tablet (64.8 mg total) by mouth 2 (two) times daily. 180 tablet 3  . polyethylene glycol (MIRALAX / GLYCOLAX) packet Take 17 g by mouth daily as needed for moderate constipation. 14 each 0  . pravastatin (PRAVACHOL) 40 MG tablet Take 1 tablet (40 mg total) by mouth every evening. [place]  tablet 3  . sildenafil (VIAGRA) 100 MG tablet Take 0.5 tablets (50 mg total) by mouth as needed for erectile dysfunction. 6 tablet 0  . tamsulosin (FLOMAX) 0.4 MG CAPS capsule Take 1 capsule (0.4 mg total) by mouth daily. 90 capsule 3  . tiotropium (SPIRIVA HANDIHALER) 18 MCG inhalation capsule Place 1 capsule (18 mcg total) into inhaler and inhale daily. 30 capsule 2   No current facility-administered medications for this visit.    Review of Systems  General: Denies fever, diaphoresis, appetite change, and fatigue.  Respiratory: Denies SOB, cough, and wheezing.   Cardiovascular: Positive for LE swelling. Denies chest pain and palpitations.  Gastrointestinal: Denies  nausea, vomiting, abdominal pain, and diarrhea Musculoskeletal: Denies myalgias, arthralgias, back pain, and gait problem.  Neurological: Denies dizziness, syncope, weakness, lightheadedness, and headaches.  Psychiatric/Behavioral: Denies mood changes, sleep disturbance, and agitation.    Objective:   Physical Exam: Filed Vitals:   09/17/15 1349  BP: 156/78  Pulse: 71  Temp: 98 F (36.7 C)  TempSrc: Oral  Height: 5\' 7"  (1.702 m)  Weight: 241 lb 4.8 oz (109.453 kg)  SpO2: 98%    General: Obese male, alert, cooperative, NAD. HEENT: PERRL, EOMI. Moist mucus membranes Neck: Full range of motion without pain, supple, no lymphadenopathy or carotid bruits. JVD not appreciated. Lungs: Air entry equal bilaterally. No rales or rhonchi. Faint scattered wheezes. Forced stridor (patient forcefully exhales to produce stridor). Heart: RRR, no murmurs, gallops, or rubs Abdomen: Obese. Soft, non-tender, non-distended, BS +.  Extremities: No cyanosis, clubbing. +3 pitting edema extending to knees (L>R). Chronic venous stasis changes.  Neurologic: Alert & oriented x3, cranial nerves II-XII intact, strength grossly intact, sensation intact to light touch   Assessment & Plan:   Please see problem based assessment and plan.

## 2015-09-17 NOTE — Patient Instructions (Signed)
1. Please return in 2-4 weeks for follow up.   2. Please take all medications as previously prescribed with the following changes:  Start taking Spironolactone 25 mg daily for blood pressure.   Change Dulera to Symbicort. USE SPACER with inhaler.   ELEVATE YOUR LEGS WHEN ABLE. USE ABOVE KNEE COMPRESSION STOCKINGS.   3. If you have worsening of your symptoms or new symptoms arise, please call the clinic [phone] or go to the ER immediately if symptoms are severe.  Venous Stasis or Chronic Venous Insufficiency Chronic venous insufficiency, also called venous stasis, is a condition that affects the veins in the legs. The condition prevents blood from being pumped through these veins effectively. Blood may no longer be pumped effectively from the legs back to the heart. This condition can range from mild to severe. With proper treatment, you should be able to continue with an active life. CAUSES  Chronic venous insufficiency occurs when the vein walls become stretched, weakened, or damaged or when valves within the vein are damaged. Some common causes of this include:  High blood pressure inside the veins (venous hypertension).  Increased blood pressure in the leg veins from long periods of sitting or standing.  A blood clot that blocks blood flow in a vein (deep vein thrombosis).  Inflammation of a superficial vein (phlebitis) that causes a blood clot to form. RISK FACTORS Various things can make you more likely to develop chronic venous insufficiency, including:  Family history of this condition.  Obesity.  Pregnancy.  Sedentary lifestyle.  Smoking.  Jobs requiring long periods of standing or sitting in one place.  Being a certain age. Women in their 81s and 72s and men in their 51s are more likely to develop this condition. SIGNS AND SYMPTOMS  Symptoms may include:   Varicose veins.  Skin breakdown or ulcers.  Reddened or discolored skin on the leg.  Brown, smooth,  tight, and painful skin just above the ankle, usually on the inside surface (lipodermatosclerosis).  Swelling. DIAGNOSIS  To diagnose this condition, your health care provider will take a medical history and do a physical exam. The following tests may be ordered to confirm the diagnosis:  Duplex ultrasound--A procedure that produces a picture of a blood vessel and nearby organs and also provides information on blood flow through the blood vessel.  Plethysmography--A procedure that tests blood flow.  A venogram, or venography--A procedure used to look at the veins using X-ray and dye. TREATMENT The goals of treatment are to help you return to an active life and to minimize pain or disability. Treatment will depend on the severity of the condition. Medical procedures may be needed for severe cases. Treatment options may include:   Use of compression stockings. These can help with symptoms and lower the chances of the problem getting worse, but they do not cure the problem.  Sclerotherapy--A procedure involving an injection of a material that "dissolves" the damaged veins. Other veins in the network of blood vessels take over the function of the damaged veins.  Surgery to remove the vein or cut off blood flow through the vein (vein stripping or laser ablation surgery).  Surgery to repair a valve. HOME CARE INSTRUCTIONS   Wear compression stockings as directed by your health care provider.  Only take over-the-counter or prescription medicines for pain, discomfort, or fever as directed by your health care provider.  Follow up with your health care provider as directed. SEEK MEDICAL CARE IF:   You have redness,  swelling, or increasing pain in the affected area.  You see a red streak or line that extends up or down from the affected area.  You have a breakdown or loss of skin in the affected area, even if the breakdown is small.  You have an injury to the affected area. SEEK IMMEDIATE  MEDICAL CARE IF:   You have an injury and open wound in the affected area.  Your pain is severe and does not improve with medicine.  You have sudden numbness or weakness in the foot or ankle below the affected area, or you have trouble moving your foot or ankle.  You have a fever or persistent symptoms for more than 2-3 days.  You have a fever and your symptoms suddenly get worse. MAKE SURE YOU:   Understand these instructions.  Will watch your condition.  Will get help right away if you are not doing well or get worse.   This information is not intended to replace advice given to you by your health care provider. Make sure you discuss any questions you have with your health care provider.   Document Released: 08/24/2006 Document Revised: 02/08/2013 Document Reviewed: 12/26/2012 Elsevier Interactive Patient Education [employer].

## 2015-09-18 NOTE — Assessment & Plan Note (Signed)
BP Readings from Last 3 Encounters:  09/17/15 156/78  07/24/15 149/74  06/27/15 147/68    Lab Results  Component Value Date   NA 144 06/27/2015   K 4.5 06/27/2015   CREATININE 1.02 06/27/2015    Assessment: Blood pressure control:  Elevated Progress toward BP goal:   Not at goal Comments: Taking Lasix 20 mg bid, Enalapril 40 mg daily, Clonidine 0.3 mg bid, and Coreg 25 mg daily. Was taking Hydralazine 50 mg bid, but wife stopped it because his leg swelling got worse. Still has quite a bit of swelling, but improved somewhat since stopping Hydralazine.   Plan: Medications:  Continue medications as above. Stop Hydralazine. Start Spironolactone 25 mg daily for improved BP control.  Other plans: RTC in 2 weeks. Check BMP at that time.

## 2015-09-18 NOTE — Assessment & Plan Note (Addendum)
Patient's breathing at baseline, however, he states that his Ruthe Mannan has been upsetting his throat and upper chest. Feels hoarse and feels like he has congestion sometimes after he uses it. Wants to change inhaler medications.  -Change to Symbicort 80-4.5 bid. Instructed patient that he should use this with a spacer as this is likely the main issue. Given spacer in clinic.  -Continue Spiriva daily, albuterol prn

## 2015-09-18 NOTE — Assessment & Plan Note (Signed)
Still with +3 pitting edema (L>R) extending to the knees. No longer taking Hydralazine, but think that his swelling has improved slightly since his last visit. Weight is down slightly. Has associated venous stasis changes and skin thickening surrounding the ankles. No SOB, chest pain, or difficulty urinating. Still suspect this is related primarily to venous stasis. 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 May also be related to OSA. Patient does not wish to go for sleep study because his wife says it will cost them $800 out of pocket for this.  -Advised high compression stockings -Elevated legs when possible -Discussed weight loss extensively -Discontinue Hydralazine as discussed above

## 2015-09-19 NOTE — Progress Notes (Signed)
Case discussed with Dr. Jones at the time of the visit.  We reviewed the resident's history and exam and pertinent patient test results.  I agree with the assessment, diagnosis, and plan of care documented in the resident's note. 

## 2015-09-26 ENCOUNTER — Other Ambulatory Visit: Payer: Self-pay | Admitting: Internal Medicine

## 2015-09-27 ENCOUNTER — Telehealth: Payer: Self-pay | Admitting: Internal Medicine

## 2015-09-27 NOTE — Telephone Encounter (Signed)
APT. REMINDER CALL, LMTCB °

## 2015-10-01 ENCOUNTER — Ambulatory Visit: Payer: Commercial Managed Care - PPO | Admitting: Internal Medicine

## 2015-11-08 ENCOUNTER — Encounter: Payer: Self-pay | Admitting: Internal Medicine

## 2015-11-08 ENCOUNTER — Telehealth: Payer: Self-pay

## 2015-11-08 NOTE — Telephone Encounter (Signed)
Pt wife needs to speak with a nurse regarding symbicort inhaler.

## 2015-11-08 NOTE — Telephone Encounter (Signed)
Lm for rtc 

## 2015-11-11 ENCOUNTER — Other Ambulatory Visit: Payer: Self-pay | Admitting: Internal Medicine

## 2015-11-11 NOTE — Telephone Encounter (Signed)
Sent message via my chart concerning inhalers. Patient insurance has changed and will not cover any medications that are not generic per the patient. Patient currently prescribed Symbicort and Spiriva that do not come in a generic.

## 2015-11-11 NOTE — Telephone Encounter (Signed)
Is there another type of inhaler that we could switch him to that comes in a generic or could you write a letter for his insurance company stating he needs these and we can do a prior auth. Thanks!

## 2015-11-12 NOTE — Telephone Encounter (Signed)
I am sure what other options we would have for generics. Will forward to attendings for advice. I also see that he has no insurance listed and he is 70 years old. Is there a reason he does not have Medicare? Thanks.

## 2015-11-12 NOTE — Telephone Encounter (Signed)
Dr Maudie Mercury - any suggestions regarding inhalers?

## 2015-11-12 NOTE — Telephone Encounter (Signed)
Is there another type of inhaler that we could switch him to that comes in a generic? If not could you write a letter for his insurance company stating he needs these and we can do a prior auth. Thanks!

## 2015-11-13 ENCOUNTER — Encounter: Payer: Self-pay | Admitting: Internal Medicine

## 2015-11-13 ENCOUNTER — Other Ambulatory Visit: Payer: Self-pay | Admitting: Internal Medicine

## 2015-11-13 NOTE — Telephone Encounter (Signed)
The best I can think of is switching to neb solutions because all of the inhalers are brand name. Patient would need to take around-the-clock albuterol-ipratropium in place of once daily tiotropium, but budesonide nebs would be BID. Please let me know

## 2015-11-13 NOTE — Telephone Encounter (Signed)
Last seen in clinic 09/18/2015. Sent [place] a my chart message to have this filled ASAP and needs generic per message. Thanks!

## 2015-11-24 ENCOUNTER — Other Ambulatory Visit: Payer: Self-pay | Admitting: Internal Medicine

## 2015-11-24 ENCOUNTER — Encounter: Payer: Self-pay | Admitting: Internal Medicine

## 2015-11-24 DIAGNOSIS — I5042 Chronic combined systolic (congestive) and diastolic (congestive) heart failure: Secondary | ICD-10-CM

## 2015-11-24 DIAGNOSIS — M7989 Other specified soft tissue disorders: Secondary | ICD-10-CM

## 2015-12-26 ENCOUNTER — Other Ambulatory Visit: Payer: Self-pay | Admitting: Internal Medicine

## 2015-12-26 DIAGNOSIS — M7989 Other specified soft tissue disorders: Secondary | ICD-10-CM

## 2015-12-26 DIAGNOSIS — I5042 Chronic combined systolic (congestive) and diastolic (congestive) heart failure: Secondary | ICD-10-CM

## 2017-10-15 ENCOUNTER — Telehealth: Payer: Self-pay | Admitting: Internal Medicine

## 2017-10-15 NOTE — Telephone Encounter (Signed)
PATIENT CALLED TO CANCEL APPT. ON 10/18/17, [facility] WILL NOT BE COMING HERE ANYMORE

## 2017-10-18 ENCOUNTER — Encounter: Payer: Commercial Managed Care - PPO | Admitting: Internal Medicine

## 2017-10-30 ENCOUNTER — Encounter: Payer: Self-pay | Admitting: *Deleted

## 2018-06-29 ENCOUNTER — Other Ambulatory Visit: Payer: Self-pay

## 2018-06-29 ENCOUNTER — Emergency Department (HOSPITAL_COMMUNITY): Payer: No Typology Code available for payment source

## 2018-06-29 ENCOUNTER — Emergency Department (HOSPITAL_COMMUNITY)
Admission: EM | Admit: 2018-06-29 | Discharge: 2018-06-29 | Disposition: A | Discharge status: Home / Self Care | Payer: No Typology Code available for payment source | Attending: Emergency Medicine | Admitting: Emergency Medicine

## 2018-06-29 ENCOUNTER — Encounter (HOSPITAL_COMMUNITY): Payer: Self-pay | Admitting: Emergency Medicine

## 2018-06-29 DIAGNOSIS — J449 Chronic obstructive pulmonary disease, unspecified: Secondary | ICD-10-CM | POA: Diagnosis not present

## 2018-06-29 DIAGNOSIS — I10 Essential (primary) hypertension: Secondary | ICD-10-CM | POA: Insufficient documentation

## 2018-06-29 DIAGNOSIS — E875 Hyperkalemia: Secondary | ICD-10-CM | POA: Diagnosis not present

## 2018-06-29 DIAGNOSIS — Z87891 Personal history of nicotine dependence: Secondary | ICD-10-CM | POA: Insufficient documentation

## 2018-06-29 DIAGNOSIS — Z79899 Other long term (current) drug therapy: Secondary | ICD-10-CM | POA: Diagnosis not present

## 2018-06-29 DIAGNOSIS — E785 Hyperlipidemia, unspecified: Secondary | ICD-10-CM | POA: Insufficient documentation

## 2018-06-29 DIAGNOSIS — R079 Chest pain, unspecified: Secondary | ICD-10-CM | POA: Diagnosis present

## 2018-06-29 DIAGNOSIS — N289 Disorder of kidney and ureter, unspecified: Secondary | ICD-10-CM

## 2018-06-29 LAB — BASIC METABOLIC PANEL
Anion gap: 13 (ref 5–15)
BUN: 61 mg/dL — ABNORMAL HIGH (ref 8–23)
CO2: 19 mmol/L — ABNORMAL LOW (ref 22–32)
Calcium: 9.5 mg/dL (ref 8.9–10.3)
Chloride: 106 mmol/L (ref 98–111)
Creatinine, Ser: 1.7 mg/dL — ABNORMAL HIGH (ref 0.61–1.24)
GFR calc Af Amer: 45 mL/min — ABNORMAL LOW (ref >60)
GFR, EST NON AFRICAN AMERICAN: 39 mL/min — AB (ref >60)
Glucose, Bld: 105 mg/dL — ABNORMAL HIGH (ref 70–99)
POTASSIUM: 5.5 mmol/L — AB (ref 3.5–5.1)
Sodium: 138 mmol/L (ref 135–145)

## 2018-06-29 LAB — CBC
HCT: 40.6 % (ref 39.0–52.0)
Hemoglobin: 13.3 g/dL (ref 13.0–17.0)
MCH: 30.6 pg (ref 26.0–34.0)
MCHC: 32.8 g/dL (ref 30.0–36.0)
MCV: 93.5 fL (ref 80.0–100.0)
Platelets: 140 10*3/uL — ABNORMAL LOW (ref 150–400)
RBC: 4.34 MIL/uL (ref 4.22–5.81)
RDW: 12.9 % (ref 11.5–15.5)
WBC: 6.8 10*3/uL (ref 4.0–10.5)
nRBC: 0 % (ref 0.0–0.2)

## 2018-06-29 LAB — I-STAT TROPONIN, ED: Troponin i, poc: 0.01 ng/mL (ref 0.00–0.08)

## 2018-06-29 MED ORDER — SODIUM CHLORIDE 0.9% FLUSH: 3.0000 mL | Freq: Once | INTRAVENOUS | Status: DC

## 2018-06-29 NOTE — ED Provider Notes (Signed)
[facility] EMERGENCY DEPARTMENT Provider Note   CSN: [id number] Arrival date & time: 06/29/18  1206    History   Chief Complaint Chief Complaint  Patient presents with  . Chest Pain    HPI Ricky Hayden is a 73 y.o. male.     Patient with a long wait in the emergency department waiting room.  Patient with report of chest pain for the past few months it comes and goes.  Has not had any today.  He reported left hand numbness that started at about 8 in the morning.  Usually goes away.  Involves his third fourth and fifth finger does not involve the index or thumb.  Occasionally has some neck pain with it.  No other symptoms no speech problems no weakness.  Has had this numbness in the past.  Still has a little bit now but has improved significantly.  No neck pain currently.  Patient is followed by the [facility] clinic in [place].     Past Medical History:  Diagnosis Date  . Arthritis    osteoarthritis of left knee  . Balanitis    recurrent  . Cardiac arrhythmia    life threatening, secondary to CCB vs b- blockers  . Chronic joint pain   . COPD (chronic obstructive pulmonary disease) ([place])   . Coronary artery disease   . Enlarged prostate   . 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 GERD (gastroesophageal reflux disease)   . Gout   . Hiatal hernia   . Hyperlipidemia   . Hypertension   . Intracranial hematoma ([place]) 1995   history of, s/p evacuation by Dr. Sherwood Gambler  . Nocturia   . PONV (postoperative nausea and vomiting)   . Sinusitis    s/p ethmoidectomy and nasal septoplasty  . Vertigo    intermitantly    Patient Active Problem List   Diagnosis Date Noted  . Respiratory tract congestion with cough 06/18/2015  . Chronic combined systolic and diastolic CHF, NYHA class 2 ([place]) 06/10/2015  . Leg swelling 07/16/2014  . Preventative health care 03/07/2014  . COPD (chronic obstructive pulmonary disease) ([place]) 09/11/2013  . BPPV  (benign paroxysmal positional vertigo) 10/05/2012  . BPH (benign prostatic hyperplasia) 01/10/2008  . Gout 04/22/2006  . ERECTILE DYSFUNCTION 04/22/2006  . 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 HYPERLIPIDEMIA 03/15/2006  . Essential hypertension 03/15/2006  . GERD 03/15/2006  . HIATAL HERNIA 03/15/2006  . OSTEOARTHRITIS 03/15/2006  . 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 CIRCUMCISION N/A 11/20/2013   Procedure: CIRCUMCISION ADULT;  Surgeon: Claybon Jabs, MD;  Location: [facility];  Service: Urology;  Laterality: N/A;  . [place]   hematomy due to sinus infection   . shoulder surg rt   1995  . SINUS SURGERY WITH INSTATRAK     ethmoidectomy and nasal septum repair        Home Medications    Prior to Admission medications   Medication Sig Start Date End Date Taking? Authorizing Provider  albuterol (PROVENTIL HFA;VENTOLIN HFA) 108 (90 BASE) MCG/ACT inhaler Inhale 2 puffs into the lungs every 6 (six) hours as needed for wheezing. 12/10/14   Maryellen Pile, MD  allopurinol (ZYLOPRIM) 100 MG tablet Take 2 tablets (200 mg total) by mouth daily. 12/10/14 12/10/15  Maryellen Pile, MD  budesonide-formoterol (SYMBICORT) 80-4.5 MCG/ACT inhaler Inhale 2 puffs into the lungs 2 (two) times daily. 09/17/15   Luanne Bras  W, MD  carvedilol (COREG) 25 MG tablet TAKE ONE TABLET BY MOUTH TWICE DAILY WITH MEALS 11/12/15   Maryellen Pile, MD  carvedilol (COREG) 25 MG tablet TAKE ONE TABLET BY MOUTH TWICE DAILY WITH MEALS 11/13/15   Maryellen Pile, MD  cloNIDine (CATAPRES) 0.3 MG tablet Take 1 tablet (0.3 mg total) by mouth 2 (two) times daily. 03/05/15   Maryellen Pile, MD  colchicine 0.6 MG tablet Take 1 tablet (0.6 mg total) by mouth 2 (two) times daily as needed. Use only during gout flare 03/05/14   Kelby Aline, MD  docusate sodium (COLACE) 100 MG capsule Take 1-2 capsules (100-200 mg total) by mouth 2 (two) times daily as needed for mild  constipation. 03/05/14   Kelby Aline, MD  enalapril (VASOTEC) 20 MG tablet Take 2 tablets (40 mg total) by mouth every evening. 04/01/15   Maryellen Pile, MD  finasteride (PROSCAR) 5 MG tablet TAKE ONE TABLET BY MOUTH ONCE DAILY 03/05/15   Maryellen Pile, MD  furosemide (LASIX) 20 MG tablet TAKE ONE TABLET BY MOUTH ONCE DAILY 12/27/15   Maryellen Pile, MD  omeprazole (PRILOSEC) 20 MG capsule Take 1 capsule (20 mg total) by mouth daily. 12/10/14   Maryellen Pile, MD  PHENobarbital (LUMINAL) 64.8 MG tablet Take 1 tablet (64.8 mg total) by mouth 2 (two) times daily. 07/24/15   Corky Sox, MD  polyethylene glycol [facility] Floria Raveling) packet Take 17 g by mouth daily as needed for moderate constipation. 12/10/14   Maryellen Pile, MD  pravastatin (PRAVACHOL) 40 MG tablet Take 1 tablet (40 mg total) by mouth every evening. 12/10/14   Maryellen Pile, MD  sildenafil (VIAGRA) 100 MG tablet Take 0.5 tablets (50 mg total) by mouth as needed for erectile dysfunction. 07/16/14 07/16/15  Kelby Aline, MD  SPIRIVA HANDIHALER 18 MCG inhalation capsule INHALE ONE DOSE BY MOUTH ONCE DAILY 09/27/15   Maryellen Pile, MD  spironolactone (ALDACTONE) 25 MG tablet Take 1 tablet (25 mg total) by mouth 2 (two) times daily. 09/17/15 09/16/16  Corky Sox, MD  tamsulosin (FLOMAX) 0.4 MG CAPS capsule Take 1 capsule (0.4 mg total) by mouth daily. 03/05/15   Maryellen Pile, MD    Family History No family history on file.  Social History Social History   Tobacco Use  . Smoking status: Former Smoker    Last attempt to quit: 05/04/1986    Years since quitting: 32.1  . Smokeless tobacco: Never Used  Substance Use Topics  . Alcohol use: No    Alcohol/week: 0.0 standard drinks  . Drug use: No     Allergies   Calcium channel blockers   Review of Systems Review of Systems  Constitutional: Negative for chills and fever.  HENT: Negative for congestion, rhinorrhea and sore throat.   Eyes: Negative for visual  disturbance.  Respiratory: Negative for cough and shortness of breath.   Cardiovascular: Positive for chest pain. Negative for leg swelling.  Gastrointestinal: Negative for abdominal pain, diarrhea, nausea and vomiting.  Genitourinary: Negative for dysuria.  Musculoskeletal: Negative for back pain and neck pain.  Skin: Negative for rash.  Neurological: Positive for numbness. Negative for dizziness, light-headedness and headaches.  Hematological: Does not bruise/bleed easily.  Psychiatric/Behavioral: Negative for confusion.     Physical Exam Updated Vital Signs BP 117/71   Pulse (!) 53   Temp 98.4 F (36.9 C) (Oral)   Resp 14   Ht 1.702 m (5\' 7" )   Wt 112.5 kg   SpO2 96%  BMI 38.84 kg/m   Physical Exam Vitals signs and nursing note reviewed.  Constitutional:      General: He is not in acute distress.    Appearance: Normal appearance. He is well-developed.  HENT:     Head: Normocephalic and atraumatic.     Mouth/Throat:     Mouth: Mucous membranes are moist.  Eyes:     Conjunctiva/sclera: Conjunctivae normal.  Neck:     Musculoskeletal: Neck supple.  Cardiovascular:     Rate and Rhythm: Normal rate and regular rhythm.     Heart sounds: Normal heart sounds. No murmur.  Pulmonary:     Effort: Pulmonary effort is normal. No respiratory distress.     Breath sounds: Normal breath sounds.  Chest:     Chest wall: No tenderness.  Abdominal:     Palpations: Abdomen is soft.     Tenderness: There is no abdominal tenderness.  Musculoskeletal: Normal range of motion.        General: No swelling.  Skin:    General: Skin is warm and dry.  Neurological:     Mental Status: He is alert and oriented to person, place, and time.     Cranial Nerves: No cranial nerve deficit.     Sensory: Sensory deficit present.     Motor: No weakness.      ED Treatments / Results  Labs (all labs ordered are listed, but only abnormal results are displayed) Labs Reviewed  BASIC METABOLIC  PANEL - Abnormal; Notable for the following components:      Result Value   Potassium 5.5 (*)    CO2 19 (*)    Glucose, Bld 105 (*)    BUN 61 (*)    Creatinine, Ser 1.70 (*)    GFR calc non Af Amer 39 (*)    GFR calc Af Amer 45 (*)    All other components within normal limits  CBC - Abnormal; Notable for the following components:   Platelets 140 (*)    All other components within normal limits  I-STAT TROPONIN, ED    EKG EKG Interpretation  Date/Time:  Wednesday June 29 2018 12:13:13 EST Ventricular Rate:  64 PR Interval:  220 QRS Duration: 158 QT Interval:  504 QTC Calculation: 519 R Axis:   -52 Text Interpretation:  Sinus rhythm with 1st degree A-V block Left axis deviation Left bundle branch block Abnormal ECG Otherwise no significant change Confirmed by Deno Etienne [phone]) on 06/29/2018 4:58:50 PM   Radiology Dg Chest 2 View  Result Date: 06/29/2018 CLINICAL DATA:  Numbness in lt hand this morning with some chest discomfort ,, EXAM: CHEST - 2 VIEW COMPARISON:  06/17/2015 FINDINGS: Lungs are clear. Heart size and mediastinal contours are within normal limits. Aortic Atherosclerosis (ICD10-170.0). No effusion. Visualized bones unremarkable. IMPRESSION: No acute cardiopulmonary disease. Electronically Signed   By: Lucrezia Europe M.D.   On: 06/29/2018 13:08    Procedures Procedures (including critical care time)  Medications Ordered in ED Medications  sodium chloride flush (NS) 0.9 % injection 3 mL (has no administration in time range)     Initial Impression / Assessment and Plan / ED Course  I have reviewed the triage vital signs and the nursing notes.  Pertinent labs & imaging results that were available during my care of the patient were reviewed by me and considered in my medical decision making (see chart for details).        Patient's left hand numbness is suggestive of a cervical  radiculopathy type pain.  No weakness.  Involves the third fourth and fifth  finger does not involve the index or thumb finger.  This is been ongoing for a period of time just lasted longer today.  But is improving currently not associated with neck pain.  Patient's had chest pain on and off intermittently no chest pain today.  Patient came in because of the hand numbness.  Work-up here shows hyperkalemia with a potassium of 5.5 and some renal insufficiency.  Patient is known to have both of those followed at the [facility] for this.  Did offer admission for the elevated BUN and the elevated potassium patient did not want to be admitted.  He said he will follow-up with the [place].  Potassium was not in a critical level.  EKG did not have any acute changes suggestive of hyperkalemia.  No peak T waves.  QRS complexes were widened but probably secondary to the left bundle branch block.  Not due to the hyperkalemia.  Patient states that the [place] has helped him with this before he just wants to go and follow-up with the [place].  He understands that if potassium gets higher it can be potentially dangerous.  BUN may be elevated patient states he has had intermittent dark stools.  None recently.  Regarding the chest pain chest x-ray without any acute findings and troponin was negative.  No evidence of an acute cardiac event at this time.   Final Clinical Impressions(s) / ED Diagnoses   Final diagnoses:  Renal insufficiency  Hyperkalemia    ED Discharge Orders    None       Fredia Sorrow, MD 06/29/18 2333

## 2018-06-29 NOTE — ED Notes (Signed)
Chest pain for several days wityh some lt hand numbness  No  Chest pain at present

## 2018-06-29 NOTE — ED Triage Notes (Addendum)
Pt reports chest pain for the past few months that comes and goes. Pt reports left hand numbness that started at 0756. Pt reports the left hand numbness usually goes away after a few minutes but this time it has not gone away.

## 2018-06-29 NOTE — Discharge Instructions (Addendum)
As we discussed make an appointment to follow-up with the [place].  Anything new or worse come back for admission you met criteria for admission here tonight.  Get back on your strict diet for your elevated potassiums and your kidney function.  [facility]  Return for any new or worse symptoms.

## 2018-06-29 NOTE — ED Notes (Signed)
Pt reports hand numbness has gone away and he is ready to go home.

## 2018-10-21 ENCOUNTER — Other Ambulatory Visit: Payer: Self-pay

## 2018-10-21 ENCOUNTER — Inpatient Hospital Stay (HOSPITAL_COMMUNITY)
Admission: EM | Admit: 2018-10-21 | Discharge: 2018-10-23 | DRG: 683 | Disposition: A | Discharge status: Home / Self Care | Payer: Commercial Managed Care - PPO | Attending: Internal Medicine | Admitting: Internal Medicine

## 2018-10-21 ENCOUNTER — Inpatient Hospital Stay (HOSPITAL_COMMUNITY): Payer: Commercial Managed Care - PPO

## 2018-10-21 ENCOUNTER — Encounter (HOSPITAL_COMMUNITY): Payer: Self-pay | Admitting: General Practice

## 2018-10-21 DIAGNOSIS — E669 Obesity, unspecified: Secondary | ICD-10-CM | POA: Diagnosis present

## 2018-10-21 DIAGNOSIS — T464X5A Adverse effect of angiotensin-converting-enzyme inhibitors, initial encounter: Secondary | ICD-10-CM | POA: Diagnosis present

## 2018-10-21 DIAGNOSIS — D696 Thrombocytopenia, unspecified: Secondary | ICD-10-CM | POA: Diagnosis present

## 2018-10-21 DIAGNOSIS — I10 Essential (primary) hypertension: Secondary | ICD-10-CM

## 2018-10-21 DIAGNOSIS — Z8673 Personal history of transient ischemic attack (TIA), and cerebral infarction without residual deficits: Secondary | ICD-10-CM | POA: Diagnosis not present

## 2018-10-21 DIAGNOSIS — T502X5A Adverse effect of carbonic-anhydrase inhibitors, benzothiadiazides and other diuretics, initial encounter: Secondary | ICD-10-CM | POA: Diagnosis present

## 2018-10-21 DIAGNOSIS — I2510 Atherosclerotic heart disease of native coronary artery without angina pectoris: Secondary | ICD-10-CM | POA: Diagnosis present

## 2018-10-21 DIAGNOSIS — E875 Hyperkalemia: Secondary | ICD-10-CM | POA: Diagnosis present

## 2018-10-21 DIAGNOSIS — Z87820 Personal history of traumatic brain injury: Secondary | ICD-10-CM | POA: Diagnosis not present

## 2018-10-21 DIAGNOSIS — Z1159 Encounter for screening for other viral diseases: Secondary | ICD-10-CM

## 2018-10-21 DIAGNOSIS — N179 Acute kidney failure, unspecified: Principal | ICD-10-CM | POA: Diagnosis present

## 2018-10-21 DIAGNOSIS — T500X5A Adverse effect of mineralocorticoids and their antagonists, initial encounter: Secondary | ICD-10-CM | POA: Diagnosis present

## 2018-10-21 DIAGNOSIS — Z87891 Personal history of nicotine dependence: Secondary | ICD-10-CM

## 2018-10-21 DIAGNOSIS — Z6838 Body mass index (BMI) 38.0-38.9, adult: Secondary | ICD-10-CM | POA: Diagnosis not present

## 2018-10-21 DIAGNOSIS — K219 Gastro-esophageal reflux disease without esophagitis: Secondary | ICD-10-CM | POA: Diagnosis present

## 2018-10-21 DIAGNOSIS — I129 Hypertensive chronic kidney disease with stage 1 through stage 4 chronic kidney disease, or unspecified chronic kidney disease: Secondary | ICD-10-CM | POA: Diagnosis present

## 2018-10-21 DIAGNOSIS — E872 Acidosis: Secondary | ICD-10-CM | POA: Diagnosis present

## 2018-10-21 DIAGNOSIS — N400 Enlarged prostate without lower urinary tract symptoms: Secondary | ICD-10-CM | POA: Diagnosis present

## 2018-10-21 DIAGNOSIS — Z7951 Long term (current) use of inhaled steroids: Secondary | ICD-10-CM

## 2018-10-21 DIAGNOSIS — M109 Gout, unspecified: Secondary | ICD-10-CM | POA: Diagnosis present

## 2018-10-21 DIAGNOSIS — Z888 Allergy status to other drugs, medicaments and biological substances status: Secondary | ICD-10-CM

## 2018-10-21 DIAGNOSIS — Z79899 Other long term (current) drug therapy: Secondary | ICD-10-CM

## 2018-10-21 DIAGNOSIS — J449 Chronic obstructive pulmonary disease, unspecified: Secondary | ICD-10-CM | POA: Diagnosis present

## 2018-10-21 DIAGNOSIS — N183 Chronic kidney disease, stage 3 (moderate): Secondary | ICD-10-CM | POA: Diagnosis present

## 2018-10-21 DIAGNOSIS — I498 Other specified cardiac arrhythmias: Secondary | ICD-10-CM

## 2018-10-21 DIAGNOSIS — E785 Hyperlipidemia, unspecified: Secondary | ICD-10-CM | POA: Diagnosis present

## 2018-10-21 LAB — COMPREHENSIVE METABOLIC PANEL
ALT: 22 U/L (ref 0–44)
AST: 17 U/L (ref 15–41)
Albumin: 4 g/dL (ref 3.5–5.0)
Alkaline Phosphatase: 57 U/L (ref 38–126)
Anion gap: 7 (ref 5–15)
BUN: 84 mg/dL — ABNORMAL HIGH (ref 8–23)
CO2: 17 mmol/L — ABNORMAL LOW (ref 22–32)
Calcium: 9.6 mg/dL (ref 8.9–10.3)
Chloride: 114 mmol/L — ABNORMAL HIGH (ref 98–111)
Creatinine, Ser: 2.28 mg/dL — ABNORMAL HIGH (ref 0.61–1.24)
GFR calc Af Amer: 32 mL/min — ABNORMAL LOW (ref >60)
GFR calc non Af Amer: 27 mL/min — ABNORMAL LOW (ref >60)
Glucose, Bld: 130 mg/dL — ABNORMAL HIGH (ref 70–99)
Potassium: 6.5 mmol/L (ref 3.5–5.1)
Sodium: 138 mmol/L (ref 135–145)
Total Bilirubin: 0.3 mg/dL (ref 0.3–1.2)
Total Protein: 7.2 g/dL (ref 6.5–8.1)

## 2018-10-21 LAB — CBC WITH DIFFERENTIAL/PLATELET
Abs Immature Granulocytes: 0.12 10*3/uL — ABNORMAL HIGH (ref 0.00–0.07)
Basophils Absolute: 0 10*3/uL (ref 0.0–0.1)
Basophils Relative: 1 %
Eosinophils Absolute: 0.2 10*3/uL (ref 0.0–0.5)
Eosinophils Relative: 3 %
HCT: 37 % — ABNORMAL LOW (ref 39.0–52.0)
Hemoglobin: 11.7 g/dL — ABNORMAL LOW (ref 13.0–17.0)
Immature Granulocytes: 2 %
Lymphocytes Relative: 23 %
Lymphs Abs: 1.4 10*3/uL (ref 0.7–4.0)
MCH: 30.3 pg (ref 26.0–34.0)
MCHC: 31.6 g/dL (ref 30.0–36.0)
MCV: 95.9 fL (ref 80.0–100.0)
Monocytes Absolute: 0.7 10*3/uL (ref 0.1–1.0)
Monocytes Relative: 12 %
Neutro Abs: 3.6 10*3/uL (ref 1.7–7.7)
Neutrophils Relative %: 59 %
Platelets: 113 10*3/uL — ABNORMAL LOW (ref 150–400)
RBC: 3.86 MIL/uL — ABNORMAL LOW (ref 4.22–5.81)
RDW: 13.2 % (ref 11.5–15.5)
WBC: 6 10*3/uL (ref 4.0–10.5)
nRBC: 0 % (ref 0.0–0.2)

## 2018-10-21 LAB — BASIC METABOLIC PANEL
Anion gap: 4 — ABNORMAL LOW (ref 5–15)
Anion gap: 7 (ref 5–15)
BUN: 83 mg/dL — ABNORMAL HIGH (ref 8–23)
BUN: 81 mg/dL — ABNORMAL HIGH (ref 8–23)
CO2: 23 mmol/L (ref 22–32)
CO2: 23 mmol/L (ref 22–32)
Calcium: 10.1 mg/dL (ref 8.9–10.3)
Calcium: 10.1 mg/dL (ref 8.9–10.3)
Chloride: 112 mmol/L — ABNORMAL HIGH (ref 98–111)
Chloride: 109 mmol/L (ref 98–111)
Creatinine, Ser: 2.21 mg/dL — ABNORMAL HIGH (ref 0.61–1.24)
Creatinine, Ser: 2.08 mg/dL — ABNORMAL HIGH (ref 0.61–1.24)
GFR calc Af Amer: 33 mL/min — ABNORMAL LOW (ref >60)
GFR calc Af Amer: 35 mL/min — ABNORMAL LOW (ref >60)
GFR calc non Af Amer: 28 mL/min — ABNORMAL LOW (ref >60)
GFR calc non Af Amer: 30 mL/min — ABNORMAL LOW (ref >60)
Glucose, Bld: 94 mg/dL (ref 70–99)
Glucose, Bld: 79 mg/dL (ref 70–99)
Potassium: 6.8 mmol/L (ref 3.5–5.1)
Potassium: 5.3 mmol/L — ABNORMAL HIGH (ref 3.5–5.1)
Sodium: 139 mmol/L (ref 135–145)
Sodium: 139 mmol/L (ref 135–145)

## 2018-10-21 LAB — CREATININE, SERUM
Creatinine, Ser: 2.29 mg/dL — ABNORMAL HIGH (ref 0.61–1.24)
GFR calc Af Amer: 31 mL/min — ABNORMAL LOW (ref >60)
GFR calc non Af Amer: 27 mL/min — ABNORMAL LOW (ref >60)

## 2018-10-21 LAB — CBG MONITORING, ED: Glucose-Capillary: 120 mg/dL — ABNORMAL HIGH (ref 70–99)

## 2018-10-21 LAB — PHOSPHORUS: Phosphorus: 4.3 mg/dL (ref 2.5–4.6)

## 2018-10-21 LAB — MAGNESIUM: Magnesium: 1.9 mg/dL (ref 1.7–2.4)

## 2018-10-21 MED ORDER — INSULIN ASPART 100 UNIT/ML IV SOLN
5.0000 [IU] | Freq: Once | INTRAVENOUS | Status: AC
  Administered 2018-10-21: 5 [IU] via INTRAVENOUS

## 2018-10-21 MED ORDER — PRAVASTATIN SODIUM 40 MG PO TABS
40.0000 mg | ORAL_TABLET | Freq: Every evening | ORAL | Status: DC
  Administered 2018-10-21 – 2018-10-22 (×2): 40 mg via ORAL
  Filled 2018-10-21 (×2): qty 1

## 2018-10-21 MED ORDER — SODIUM ZIRCONIUM CYCLOSILICATE 10 G PO PACK
10.0000 g | PACK | Freq: Two times a day (BID) | ORAL | Status: DC
  Administered 2018-10-22 – 2018-10-23 (×2): 10 g via ORAL
  Filled 2018-10-21 (×4): qty 1

## 2018-10-21 MED ORDER — SODIUM BICARBONATE 8.4 % IV SOLN
INTRAVENOUS | Status: DC
  Administered 2018-10-21 – 2018-10-22 (×2): via INTRAVENOUS
  Filled 2018-10-21 (×3): qty 150

## 2018-10-21 MED ORDER — SODIUM ZIRCONIUM CYCLOSILICATE 10 G PO PACK
10.0000 g | PACK | Freq: Once | ORAL | Status: AC
  Administered 2018-10-21: 10 g via ORAL
  Filled 2018-10-21: qty 1

## 2018-10-21 MED ORDER — SODIUM CHLORIDE 0.9 % IV SOLN
1.0000 g | Freq: Once | INTRAVENOUS | Status: AC
  Administered 2018-10-21: 1 g via INTRAVENOUS
  Filled 2018-10-21: qty 10

## 2018-10-21 MED ORDER — MOMETASONE FURO-FORMOTEROL FUM 100-5 MCG/ACT IN AERO
2.0000 | INHALATION_SPRAY | Freq: Two times a day (BID) | RESPIRATORY_TRACT | Status: DC
  Administered 2018-10-22 – 2018-10-23 (×2): 2 via RESPIRATORY_TRACT
  Filled 2018-10-21: qty 8.8

## 2018-10-21 MED ORDER — TIOTROPIUM BROMIDE MONOHYDRATE 18 MCG IN CAPS: 18.0000 ug | ORAL_CAPSULE | Freq: Every day | RESPIRATORY_TRACT | Status: DC

## 2018-10-21 MED ORDER — SODIUM POLYSTYRENE SULFONATE 15 GM/60ML PO SUSP: 30.0000 g | Freq: Four times a day (QID) | ORAL | Status: DC

## 2018-10-21 MED ORDER — ENOXAPARIN SODIUM 40 MG/0.4ML ~~LOC~~ SOLN
40.0000 mg | SUBCUTANEOUS | Status: DC
  Filled 2018-10-21: qty 0.4

## 2018-10-21 MED ORDER — CLONIDINE HCL 0.2 MG PO TABS
0.3000 mg | ORAL_TABLET | Freq: Two times a day (BID) | ORAL | Status: DC
  Administered 2018-10-21 – 2018-10-23 (×4): 0.3 mg via ORAL
  Filled 2018-10-21 (×4): qty 1

## 2018-10-21 MED ORDER — FUROSEMIDE 10 MG/ML IJ SOLN
20.0000 mg | Freq: Once | INTRAMUSCULAR | Status: AC
  Administered 2018-10-21: 20 mg via INTRAVENOUS
  Filled 2018-10-21: qty 2

## 2018-10-21 MED ORDER — SODIUM BICARBONATE 8.4 % IV SOLN
INTRAVENOUS | Status: DC
  Administered 2018-10-21: 15:00:00 via INTRAVENOUS

## 2018-10-21 MED ORDER — TAMSULOSIN HCL 0.4 MG PO CAPS
0.4000 mg | ORAL_CAPSULE | Freq: Every day | ORAL | Status: DC
  Administered 2018-10-21 – 2018-10-23 (×3): 0.4 mg via ORAL
  Filled 2018-10-21 (×3): qty 1

## 2018-10-21 MED ORDER — PANTOPRAZOLE SODIUM 40 MG PO TBEC
40.0000 mg | DELAYED_RELEASE_TABLET | Freq: Every day | ORAL | Status: DC
  Administered 2018-10-22 – 2018-10-23 (×2): 40 mg via ORAL
  Filled 2018-10-21 (×2): qty 1

## 2018-10-21 MED ORDER — DEXTROSE 50 % IV SOLN
1.0000 | Freq: Once | INTRAVENOUS | Status: AC
  Administered 2018-10-21: 50 mL via INTRAVENOUS
  Filled 2018-10-21: qty 50

## 2018-10-21 MED ORDER — CARVEDILOL 12.5 MG PO TABS
25.0000 mg | ORAL_TABLET | Freq: Two times a day (BID) | ORAL | Status: DC
  Administered 2018-10-21 – 2018-10-23 (×4): 25 mg via ORAL
  Filled 2018-10-21 (×4): qty 2

## 2018-10-21 MED ORDER — POLYETHYLENE GLYCOL 3350 17 G PO PACK: 17.0000 g | PACK | Freq: Every day | ORAL | Status: DC | PRN

## 2018-10-21 MED ORDER — SODIUM ZIRCONIUM CYCLOSILICATE 10 G PO PACK
10.0000 g | PACK | Freq: Once | ORAL | Status: AC
  Administered 2018-10-22: 10 g via ORAL
  Filled 2018-10-21: qty 1

## 2018-10-21 MED ORDER — DOCUSATE SODIUM 100 MG PO CAPS: 100.0000 mg | ORAL_CAPSULE | Freq: Two times a day (BID) | ORAL | Status: DC | PRN

## 2018-10-21 MED ORDER — ALLOPURINOL 100 MG PO TABS
50.0000 mg | ORAL_TABLET | Freq: Two times a day (BID) | ORAL | Status: DC
  Administered 2018-10-21 – 2018-10-23 (×4): 50 mg via ORAL
  Filled 2018-10-21 (×4): qty 1

## 2018-10-21 MED ORDER — SODIUM BICARBONATE 8.4 % IV SOLN
50.0000 meq | Freq: Once | INTRAVENOUS | Status: AC
  Administered 2018-10-21: 50 meq via INTRAVENOUS
  Filled 2018-10-21: qty 50

## 2018-10-21 MED ORDER — ALLOPURINOL 100 MG PO TABS: 50.0000 mg | ORAL_TABLET | Freq: Two times a day (BID) | ORAL | Status: DC

## 2018-10-21 MED ORDER — PHENOBARBITAL 60 MG PO TABS
60.0000 mg | ORAL_TABLET | Freq: Two times a day (BID) | ORAL | Status: DC
  Administered 2018-10-21 – 2018-10-23 (×4): 60 mg via ORAL
  Filled 2018-10-21 (×4): qty 2

## 2018-10-21 MED ORDER — SODIUM BICARBONATE 8.4 % IV SOLN
Freq: Once | INTRAVENOUS | Status: DC
  Filled 2018-10-21: qty 100

## 2018-10-21 NOTE — ED Notes (Addendum)
ED TO INPATIENT HANDOFF REPORT  ED Nurse Name and Phone #:  [phone]  S Name/Age/Gender Ricky Hayden 73 y.o. male Room/Bed: 016C/016C  Code Status   Code Status: Full Code  Home/SNF/Other Home Patient oriented to: self, place, time and situation Is this baseline? Yes   Triage Complete: Triage complete  Chief Complaint abnormal labs   Triage Note Pt sent here from [facility] hospital in [place] for evaluation of abnormal labs.  Pt stated he is unclear exactly what labs they are concerned about other than elevated cholesterol.       Allergies Allergies  Allergen Reactions  . Calcium Channel Blockers Other (See Comments)    Came to hospital in 1995-caused chest pain     Level of Care/Admitting Diagnosis ED Disposition    ED Disposition Condition [facility] Area: [place] [[id number]]  Level of Care: Telemetry Medical [104]  Covid Evaluation: Screening Protocol (No Symptoms)  Diagnosis: Acute renal injury [facility]) [[id number]]  Admitting Physician: Aldine Contes [phone]  Attending Physician: Aldine Contes [phone]  Estimated length of stay: past midnight tomorrow  Certification:: I certify this patient will need inpatient services for at least 2 midnights  PT Class (Do Not Modify): Inpatient [101]  PT Acc Code (Do Not Modify): Private [1]       B Medical/Surgery History Past Medical History:  Diagnosis Date  . Arthritis    osteoarthritis of left knee  . Balanitis    recurrent  . Cardiac arrhythmia    life threatening, secondary to CCB vs b- blockers  . Chronic joint pain   . COPD (chronic obstructive pulmonary disease) ([place])   . Coronary artery disease   . Enlarged prostate   . 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 GERD (gastroesophageal reflux disease)   . Gout   . Hiatal hernia   . Hyperlipidemia   . Hypertension   . Intracranial hematoma ([place]) 1995   history of, s/p evacuation by Dr.  Sherwood Gambler  . Nocturia   . PONV (postoperative nausea and vomiting)   . Sinusitis    s/p ethmoidectomy and nasal septoplasty  . Vertigo    intermitantly   Past Surgical History:  Procedure Laterality Date  . CIRCUMCISION N/A 11/20/2013   Procedure: CIRCUMCISION ADULT;  Surgeon: Claybon Jabs, MD;  Location: [facility];  Service: Urology;  Laterality: N/A;  . [place]   hematomy due to sinus infection   . shoulder surg rt   1995  . SINUS SURGERY WITH INSTATRAK     ethmoidectomy and nasal septum repair     A IV Location/Drains/Wounds Patient Lines/Drains/Airways Status   Active Line/Drains/Airways    Name:   Placement date:   Placement time:   Site:   Days:   Peripheral IV 10/21/18 Left Antecubital   10/21/18    1244    Antecubital   less than 1   Incision (Closed) 11/20/13 Penis Other (Comment)   11/20/13    0826     1796          Intake/Output Last 24 hours No intake or output data in the 24 hours ending 10/21/18 1511  Labs/Imaging Results for orders placed or performed during the hospital encounter of 10/21/18 (from the past 48 hour(s))  Comprehensive metabolic panel     Status: Abnormal   Collection Time: 10/21/18 12:41 PM  Result Value Ref Range   Sodium 138 135 - 145 mmol/L   Potassium  6.5 (HH) 3.5 - 5.1 mmol/L    Comment: NO VISIBLE HEMOLYSIS CRITICAL RESULT CALLED TO, READ BACK BY AND VERIFIED WITH: C Latoiya Maradiaga RN [phone] BY A BENNETT    Chloride 114 (H) 98 - 111 mmol/L   CO2 17 (L) 22 - 32 mmol/L   Glucose, Bld 130 (H) 70 - 99 mg/dL   BUN 84 (H) 8 - 23 mg/dL   Creatinine, Ser 2.28 (H) 0.61 - 1.24 mg/dL   Calcium 9.6 8.9 - 10.3 mg/dL   Total Protein 7.2 6.5 - 8.1 g/dL   Albumin 4.0 3.5 - 5.0 g/dL   AST 17 15 - 41 U/L   ALT 22 0 - 44 U/L   Alkaline Phosphatase 57 38 - 126 U/L   Total Bilirubin 0.3 0.3 - 1.2 mg/dL   GFR calc non Af Amer 27 (L) >60 mL/min   GFR calc Af Amer 32 (L) >60 mL/min   Anion gap 7 5 - 15    Comment: Performed at [facility] [address], [place], [place] [address]  CBC with Differential     Status: Abnormal   Collection Time: 10/21/18 12:41 PM  Result Value Ref Range   WBC 6.0 4.0 - 10.5 K/uL   RBC 3.86 (L) 4.22 - 5.81 MIL/uL   Hemoglobin 11.7 (L) 13.0 - 17.0 g/dL   HCT 37.0 (L) 39.0 - 52.0 %   MCV 95.9 80.0 - 100.0 fL   MCH 30.3 26.0 - 34.0 pg   MCHC 31.6 30.0 - 36.0 g/dL   RDW 13.2 11.5 - 15.5 %   Platelets 113 (L) 150 - 400 K/uL    Comment: REPEATED TO VERIFY PLATELET COUNT CONFIRMED BY SMEAR Immature Platelet Fraction may be clinically indicated, consider ordering this additional test [id number]    nRBC 0.0 0.0 - 0.2 %   Neutrophils Relative % 59 %   Neutro Abs 3.6 1.7 - 7.7 K/uL   Lymphocytes Relative 23 %   Lymphs Abs 1.4 0.7 - 4.0 K/uL   Monocytes Relative 12 %   Monocytes Absolute 0.7 0.1 - 1.0 K/uL   Eosinophils Relative 3 %   Eosinophils Absolute 0.2 0.0 - 0.5 K/uL   Basophils Relative 1 %   Basophils Absolute 0.0 0.0 - 0.1 K/uL   Immature Granulocytes 2 %   Abs Immature Granulocytes 0.12 (H) 0.00 - 0.07 K/uL   Tear Drop Cells PRESENT    Ovalocytes PRESENT     Comment: Performed at [facility], [place] [address] [place], [place] [address]  CBG monitoring, ED     Status: Abnormal   Collection Time: 10/21/18  3:04 PM  Result Value Ref Range   Glucose-Capillary 120 (H) 70 - 99 mg/dL   No results found.  Pending Labs Unresulted Labs (From admission, onward)    Start     Ordered   10/22/18 5361  Basic metabolic panel  Tomorrow morning,   R     10/21/18 1457   10/21/18 4431  Basic metabolic panel  Once,   STAT     10/21/18 1457   10/21/18 1458  Novel Coronavirus,NAA,(SEND-OUT TO REF LAB - TAT 24-48 hrs); Hosp Order  (Asymptomatic Patients Labs)  Once,   STAT    Question:  Rule Out  Answer:  Yes   10/21/18 1457   10/21/18 1455  Creatinine, serum  (enoxaparin (LOVENOX)    CrCl < 30 ml/min)  Once,   STAT    Comments: Baseline for  enoxaparin therapy IF NOT ALREADY  DRAWN.    10/21/18 1457   10/21/18 1453  Phosphorus  Add-on,   AD     10/21/18 1452   10/21/18 1336  Magnesium  Add-on,   AD     10/21/18 1339          Vitals/Pain Today's Vitals   10/21/18 1400 10/21/18 1430 10/21/18 1445 10/21/18 1500  BP: (!) 122/58 118/64 112/67 (!) 107/57  Pulse:  (!) 33 (!) 44 (!) 34  Resp: (!) 21 17 20 20   Temp:      TempSrc:      SpO2: 96% 96% 96% 96%  Weight:      Height:      PainSc:        Isolation Precautions Droplet and Contact precautions  Medications Medications  calcium gluconate 1 g in sodium chloride 0.9 % 100 mL IVPB (1 g Intravenous New Bag/Given 10/21/18 1420)  enoxaparin (LOVENOX) injection 30 mg (has no administration in time range)  mometasone-formoterol (DULERA) 100-5 MCG/ACT inhaler 2 puff (has no administration in time range)  carvedilol (COREG) tablet 25 mg (has no administration in time range)  tamsulosin (FLOMAX) capsule 0.4 mg (has no administration in time range)  pravastatin (PRAVACHOL) tablet 40 mg (has no administration in time range)  tiotropium (SPIRIVA) inhalation capsule ([facility] use ONLY) 18 mcg (has no administration in time range)  pantoprazole (PROTONIX) EC tablet 40 mg (has no administration in time range)  PHENobarbital (LUMINAL) tablet 64.8 mg (has no administration in time range)  polyethylene glycol (MIRALAX / GLYCOLAX) packet 17 g (has no administration in time range)  docusate sodium (COLACE) capsule 100-200 mg (has no administration in time range)  cloNIDine (CATAPRES) tablet 0.3 mg (has no administration in time range)  allopurinol (ZYLOPRIM) tablet 50 mg (has no administration in time range)  sodium bicarbonate 100 mEq in dextrose 5 % 1,000 mL infusion (has no administration in time range)  furosemide (LASIX) injection 20 mg (has no administration in time range)  insulin aspart (novoLOG) injection 5 Units (5 Units Intravenous Given 10/21/18 1352)    And  dextrose 50 % solution 50 mL (50 mLs Intravenous  Given 10/21/18 1353)  sodium bicarbonate injection 50 mEq (50 mEq Intravenous Given 10/21/18 1353)    Mobility walks Low fall risk   Focused Assessments Cardiac Assessment Handoff:  Cardiac Rhythm: Sinus bradycardia Lab Results  Component Value Date   CKTOTAL 56 02/07/2008   CKMB 0.9 02/07/2008   TROPONINI 0.02        NO INDICATION OF MYOCARDIAL INJURY. 02/07/2008   No results found for: DDIMER Does the Patient currently have chest pain? No     R Recommendations: See Admitting Provider Note  Report given to:   Additional Notes:   Patient presenting from home with lab abnormalities[facility]  No symptoms.  Labs today reveal significant hyperkalemia with a potassium of 6.5, bicarb is down at 17, AKI with creatinine of 2.28 and BUN of 84. Patient has new EKG changes, ventricular bigeminy.  IV interventions initiated to include insulin, dextrose, sodium bicarb, and calcium gluconate. Renal ultrasound to evaluate for obstruction.  Also, additional bicarb drip at 100/h and repeat potassium level in 1 hour.  Unsure etiology of hyperkalemia,

## 2018-10-21 NOTE — Discharge Summary (Signed)
Name: Ricky Hayden MRN: [id number] DOB: [birth year] 73 y.o. PCP: Patient, No Pcp Per  Date of Admission: 10/21/2018 12:27 PM Date of Discharge: 10/23/2018 Attending Physician: Dr. Dareen Piano   Discharge Diagnosis: 1.  Hyperkalemia and non-gap hyperchloremic metabolic acidosis 2.  Acute on chronic kidney disease stage III 3.  Hypertension  Discharge Medications: Allergies as of 10/23/2018      Reactions   Calcium Channel Blockers Other (See Comments)   Came to hospital in 1995-caused chest pain      Medication List    STOP taking these medications   colchicine 0.6 MG tablet   enalapril 20 MG tablet Commonly known as: VASOTEC   spironolactone 25 MG tablet Commonly known as: Aldactone     TAKE these medications   albuterol 108 (90 Base) MCG/ACT inhaler Commonly known as: VENTOLIN HFA Inhale 2 puffs into the lungs every 6 (six) hours as needed for wheezing.   allopurinol 100 MG tablet Commonly known as: Zyloprim Take 2 tablets (200 mg total) by mouth daily.   budesonide-formoterol 80-4.5 MCG/ACT inhaler Commonly known as: Symbicort Inhale 2 puffs into the lungs 2 (two) times daily.   carvedilol 25 MG tablet Commonly known as: COREG TAKE ONE TABLET BY MOUTH TWICE DAILY WITH MEALS   cloNIDine 0.3 MG tablet Commonly known as: CATAPRES Take 1 tablet (0.3 mg total) by mouth 2 (two) times daily.   docusate sodium 100 MG capsule Commonly known as: COLACE Take 1-2 capsules (100-200 mg total) by mouth 2 (two) times daily as needed for mild constipation.   finasteride 5 MG tablet Commonly known as: PROSCAR TAKE ONE TABLET BY MOUTH ONCE DAILY   furosemide 20 MG tablet Commonly known as: LASIX TAKE ONE TABLET BY MOUTH ONCE DAILY What changed: how much to take   ICAPS AREDS 2 PO Take 2 capsules by mouth daily.   omeprazole 20 MG capsule Commonly known as: PRILOSEC Take 1 capsule (20 mg total) by mouth daily.   PHENobarbital 64.8 MG tablet Commonly known as:  LUMINAL Take 1 tablet (64.8 mg total) by mouth 2 (two) times daily.   polyethylene glycol 17 g packet Commonly known as: MIRALAX / GLYCOLAX Take 17 g by mouth daily as needed for moderate constipation.   pravastatin 40 MG tablet Commonly known as: PRAVACHOL Take 1 tablet (40 mg total) by mouth every evening.   sildenafil 100 MG tablet Commonly known as: Viagra Take 0.5 tablets (50 mg total) by mouth as needed for erectile dysfunction.   Spiriva HandiHaler 18 MCG inhalation capsule Generic drug: tiotropium INHALE ONE DOSE BY MOUTH ONCE DAILY What changed: See the new instructions.   tamsulosin 0.4 MG Caps capsule Commonly known as: FLOMAX Take 1 capsule (0.4 mg total) by mouth daily.       Disposition and follow-up:   RickyDominic Hayden was discharged from [facility] in [place] condition.  At the hospital follow up visit please address:  1.  Hyperkalemia and non-gap hyperchloremic metabolic acidosis      Acute on chronic kidney disease stage III      Hypertension  Presented with a serum potassium of 6.5 which improved with serum bicarb infusion and D5W.  At discharge, serum potassium was 4.7.  Was advised to hold enalapril and spironolactone until received further evaluation by outpatient nephrologist and PCP.  2.  Labs / imaging needed at time of follow-up: BMP  3.  Pending labs/ test needing follow-up: None  Follow-up Appointments:   Hospital Course by problem  list: 1.  Hyperkalemia and non-gap hyperchloremic metabolic acidosis      Acute on chronic kidney disease stage III  [name] is a 73 year old gentleman with hypertension, hyperlipidemia, COPD, GERD, gout, Neri artery disease, intracranial hemorrhage status post craniotomy in 2007 (on phenobarbital for seizure prophylaxis).  He presented to  [facility] emergency department on 10/21/2018 at the behest of [place] after he had some abnormal labs.   He was found to have serum potassium level of 7  on routine lab work.  His renal injury and hyperkalemia were secondary to continued ACE inhibitor and his potassium sparing diuretic usage (enalapril and spironolactone).  On arrival, he received IV calcium gluconate, 5 units of NovoLog, D50 and an amp of sodium bicarb.  Renal ultrasound showed no evidence of hydronephrosis.  He was evaluated by nephrology and was started on IV sodium bicarbonate infusion as well as D5W.  Hyperkalemia resolved and renal function improved.  Serum potassium on the day of discharge was 4.7 and creatinine was 1.8 (from 2.2 on admission).  He was instructed to hold enalapril and Aldactone until further evaluation by his nephrologist and primary care physician.  2.  Hypertension: His blood pressure remained stable on clonidine 0.3 mg twice daily, Coreg 25 mg twice daily and Lasix 20 mg daily.   Discharge Vitals:   BP 138/80 (BP Location: Right Arm)   Pulse 73   Temp 98.8 F (37.1 C) (Oral)   Resp 20   Ht 5\' 7"  (1.702 m)   Wt 108.8 kg   SpO2 95%   BMI 37.56 kg/m   Pertinent Labs, Studies, and Procedures:  RENAL / URINARY TRACT ULTRASOUND COMPLETE  COMPARISON:  None.  FINDINGS: Right Kidney:  Renal measurements: 11.2 x 5.0 x 5.2 cm = volume: 150.2 mL. Mild renal cortical thinning but normal echogenicity. There is a simple appearing 2.3 x 2.8 x 2.7 cm cyst associated with the lower pole region. No worrisome renal lesions or hydronephrosis.  Left Kidney:  Renal measurements: 11.8 x 5.1 x 5.0 cm = volume: 157.3 mL. Mild renal cortical thinning but normal echogenicity. No worrisome lesions or hydronephrosis. There is a simple appearing 1.2 x 1.7 x 1.3 cm cyst projecting off the upper pole region.  Bladder:  Moderate bladder distention.  No mass or calculi.  IMPRESSION: 1. Renal cortical thinning bilaterally but normal echogenicity. 2. Small bilateral renal cysts but no worrisome renal lesions. 3. No hydronephrosis. 4. Mild bladder distention.    BMP Latest Ref Rng & Units 10/23/2018 10/22/2018 10/22/2018  Glucose 70 - 99 mg/dL 111(H) 177(H) 133(H)  BUN 8 - 23 mg/dL 59(H) 68(H) 77(H)  Creatinine 0.61 - 1.24 mg/dL 1.88(H) 2.03(H) 1.97(H)  BUN/Creat Ratio 10 - 22 - - -  Sodium 135 - 145 mmol/L 140 139 138  Potassium 3.5 - 5.1 mmol/L 4.7 4.5 5.0  Chloride 98 - 111 mmol/L 102 101 105  CO2 22 - 32 mmol/L 28 30 25   Calcium 8.9 - 10.3 mg/dL 9.4 9.4 9.4    Discharge Instructions: Discharge Instructions    Diet - low sodium heart healthy   Complete by: As directed    Discharge instructions   Complete by: As directed    Mr. Apfel,  It was a pleasure taking care of you here at the hospital during your stay.  You were admitted because of high potassium levels.  This was due to 2 of your blood pressure medications enalapril and spironolactone.  As we discussed in the room, I  would like for you to stop taking these medications for now until you see your kidney doctor and primary care doctor.  Also, you can take your colchicine once a day for now instead of 2 times a day.  Take care Dr. Eileen Stanford   Increase activity slowly   Complete by: As directed       Signed: Jean Rosenthal, MD 10/23/2018, 9:19 AM   Pager: [phone] Internal Medicine Teaching Service

## 2018-10-21 NOTE — Progress Notes (Signed)
New Admission Note:   Arrival Method: Bed  Mental Orientation: Alert and Oriented x4  Telemetry: Box 01 Assessment: Completed Skin: Intact, buttiock red and blanable  IV: Left AC  Pain: 0/10  Tubes: none  Safety Measures: Safety Fall Prevention Plan has been given, discussed and signed Admission: Completed [facility] [place] Orientation: Patient has been orientated to the room, unit and staff.  Family: none   Orders have been reviewed and implemented. Will continue to monitor the patient. Call light has been placed within reach and bed alarm has been activated.   Aerabella Galasso RN [facility] Phone: [phone]

## 2018-10-21 NOTE — ED Triage Notes (Signed)
Pt sent here from [facility] hospital in [place] for evaluation of abnormal labs.  Pt stated he is unclear exactly what labs they are concerned about other than elevated cholesterol.

## 2018-10-21 NOTE — ED Notes (Signed)
Nurse Navigator communication: Primary RN has notified the spouse of the patient. He is being admitted.

## 2018-10-21 NOTE — ED Notes (Signed)
Velva Harman (Wife) [phone]

## 2018-10-21 NOTE — ED Provider Notes (Signed)
[facility] EMERGENCY DEPARTMENT Provider Note   CSN: [id number] Arrival date & time: 10/21/18  1227    History   Chief Complaint Chief Complaint  Patient presents with  . Abnormal Lab    HPI Ricky Hayden is a 73 y.o. male with past medical history of hypertension, hyperlipidemia, COPD, GERD, presenting to the emergency department with complaint of abnormal lab.  Patient states he had labs drawn yesterday at the [facility] in [place] and his wife was called today and told that he had abnormal lab results and was instructed to report to the ED immediately.  He states he thinks he was told his cholesterol was too high, and may be remember something about his kidneys.  He is unsure of any other results.  He has no medical complaints today.  No palpitations.  No aggravating or alleviating factors.  Patient is unsure of the names of his daily medications.    The history is provided by the patient.    Past Medical History:  Diagnosis Date  . Arthritis    osteoarthritis of left knee  . Balanitis    recurrent  . Cardiac arrhythmia    life threatening, secondary to CCB vs b- blockers  . Chronic joint pain   . COPD (chronic obstructive pulmonary disease) ([place])   . Coronary artery disease   . Enlarged prostate   . 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 GERD (gastroesophageal reflux disease)   . Gout   . Hiatal hernia   . Hyperlipidemia   . Hypertension   . Intracranial hematoma ([place]) 1995   history of, s/p evacuation by Dr. Sherwood Gambler  . Nocturia   . PONV (postoperative nausea and vomiting)   . Sinusitis    s/p ethmoidectomy and nasal septoplasty  . Vertigo    intermitantly    Patient Active Problem List   Diagnosis Date Noted  . Acute renal injury ([place]) 10/21/2018  . Respiratory tract congestion with cough 06/18/2015  . Chronic combined systolic and diastolic CHF, NYHA class 2 ([place]) 06/10/2015  . Leg swelling 07/16/2014  . Preventative  health care 03/07/2014  . COPD (chronic obstructive pulmonary disease) ([place]) 09/11/2013  . BPPV (benign paroxysmal positional vertigo) 10/05/2012  . BPH (benign prostatic hyperplasia) 01/10/2008  . Gout 04/22/2006  . ERECTILE DYSFUNCTION 04/22/2006  . 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 HYPERLIPIDEMIA 03/15/2006  . Essential hypertension 03/15/2006  . GERD 03/15/2006  . HIATAL HERNIA 03/15/2006  . OSTEOARTHRITIS 03/15/2006  . Traumatic brain injury ([place]) 03/15/2006    Past Surgical History:  Procedure Laterality Date  . CIRCUMCISION N/A 11/20/2013   Procedure: CIRCUMCISION ADULT;  Surgeon: Claybon Jabs, MD;  Location: [facility] ORS;  Service: Urology;  Laterality: N/A;  . [place]   hematomy due to sinus infection   . shoulder surg rt   1995  . SINUS SURGERY WITH INSTATRAK     ethmoidectomy and nasal septum repair        Home Medications    Prior to Admission medications   Medication Sig Start Date End Date Taking? Authorizing Provider  albuterol (PROVENTIL HFA;VENTOLIN HFA) 108 (90 BASE) MCG/ACT inhaler Inhale 2 puffs into the lungs every 6 (six) hours as needed for wheezing. 12/10/14  Yes Maryellen Pile, MD  allopurinol (ZYLOPRIM) 100 MG tablet Take 2 tablets (200 mg total) by mouth daily. 12/10/14 10/21/18 Yes Maryellen Pile, MD  budesonide-formoterol (SYMBICORT) 80-4.5 MCG/ACT inhaler Inhale 2 puffs into the lungs 2 (two) times  daily. 09/17/15  Yes Corky Sox, MD  carvedilol (COREG) 25 MG tablet TAKE ONE TABLET BY MOUTH TWICE DAILY WITH MEALS 11/13/15  Yes Maryellen Pile, MD  cloNIDine (CATAPRES) 0.3 MG tablet Take 1 tablet (0.3 mg total) by mouth 2 (two) times daily. 03/05/15  Yes Maryellen Pile, MD  colchicine 0.6 MG tablet Take 1 tablet (0.6 mg total) by mouth 2 (two) times daily as needed. Use only during gout flare 03/05/14  Yes Kelby Aline, MD  docusate sodium (COLACE) 100 MG capsule Take 1-2 capsules (100-200 mg total) by mouth 2 (two) times daily as needed for mild  constipation. 03/05/14  Yes Kelby Aline, MD  finasteride (PROSCAR) 5 MG tablet TAKE ONE TABLET BY MOUTH ONCE DAILY 03/05/15  Yes Maryellen Pile, MD  furosemide (LASIX) 20 MG tablet TAKE ONE TABLET BY MOUTH ONCE DAILY Patient taking differently: Take 40 mg by mouth daily.  12/27/15  Yes Maryellen Pile, MD  Multiple Vitamins-Minerals (ICAPS AREDS 2 PO) Take 2 capsules by mouth daily.   Yes [provider]  omeprazole (PRILOSEC) 20 MG capsule Take 1 capsule (20 mg total) by mouth daily. 12/10/14  Yes Maryellen Pile, MD  PHENobarbital (LUMINAL) 64.8 MG tablet Take 1 tablet (64.8 mg total) by mouth 2 (two) times daily. 07/24/15  Yes Corky Sox, MD  polyethylene glycol [facility] Floria Raveling) packet Take 17 g by mouth daily as needed for moderate constipation. 12/10/14  Yes Maryellen Pile, MD  pravastatin (PRAVACHOL) 40 MG tablet Take 1 tablet (40 mg total) by mouth every evening. 12/10/14  Yes Maryellen Pile, MD  sildenafil (VIAGRA) 100 MG tablet Take 0.5 tablets (50 mg total) by mouth as needed for erectile dysfunction. 07/16/14 10/21/18 Yes Kelby Aline, MD  SPIRIVA HANDIHALER 18 MCG inhalation capsule INHALE ONE DOSE BY MOUTH ONCE DAILY Patient taking differently: Place 18 mcg into inhaler and inhale daily.  09/27/15  Yes Maryellen Pile, MD  tamsulosin (FLOMAX) 0.4 MG CAPS capsule Take 1 capsule (0.4 mg total) by mouth daily. 03/05/15  Yes Maryellen Pile, MD  enalapril (VASOTEC) 20 MG tablet Take 2 tablets (40 mg total) by mouth every evening. Patient not taking: Reported on 10/21/2018 04/01/15   Maryellen Pile, MD  spironolactone (ALDACTONE) 25 MG tablet Take 1 tablet (25 mg total) by mouth 2 (two) times daily. Patient not taking: Reported on 10/21/2018 09/17/15 09/16/16  Corky Sox, MD    Family History No family history on file.  Social History Social History   Tobacco Use  . Smoking status: Former Smoker    Quit date: 05/04/1986    Years since quitting: 32.4  . Smokeless  tobacco: Never Used  Substance Use Topics  . Alcohol use: No    Alcohol/week: 0.0 standard drinks  . Drug use: No     Allergies   Calcium channel blockers   Review of Systems Review of Systems  All other systems reviewed and are negative.    Physical Exam Updated Vital Signs BP 112/67   Pulse (!) 44   Temp 98.1 F (36.7 C) (Oral)   Resp 20   Ht 5\' 7"  (1.702 m)   Wt 110.2 kg   SpO2 96%   BMI 38.06 kg/m   Physical Exam Vitals signs and nursing note reviewed.  Constitutional:      General: He is not in acute distress.    Appearance: He is well-developed. He is obese. He is not ill-appearing.  HENT:     Head: Normocephalic  and atraumatic.  Eyes:     Conjunctiva/sclera: Conjunctivae normal.  Cardiovascular:     Rate and Rhythm: Normal rate.     Pulses: Normal pulses.     Comments: Ventricular bigeminy Pulmonary:     Effort: Pulmonary effort is normal. No respiratory distress.     Breath sounds: Normal breath sounds.  Abdominal:     General: Bowel sounds are normal.     Palpations: Abdomen is soft.     Tenderness: There is no abdominal tenderness. There is no guarding or rebound.  Musculoskeletal:     Comments: Trace bilateral lower extremity edema  Skin:    General: Skin is warm.  Neurological:     Mental Status: He is alert.  Psychiatric:        Behavior: Behavior normal.      ED Treatments / Results  Labs (all labs ordered are listed, but only abnormal results are displayed) Labs Reviewed  COMPREHENSIVE METABOLIC PANEL - Abnormal; Notable for the following components:      Result Value   Potassium 6.5 (*)    Chloride 114 (*)    CO2 17 (*)    Glucose, Bld 130 (*)    BUN 84 (*)    Creatinine, Ser 2.28 (*)    GFR calc non Af Amer 27 (*)    GFR calc Af Amer 32 (*)    All other components within normal limits  CBC WITH DIFFERENTIAL/PLATELET - Abnormal; Notable for the following components:   RBC 3.86 (*)    Hemoglobin 11.7 (*)    HCT 37.0 (*)     Platelets 113 (*)    Abs Immature Granulocytes 0.12 (*)    All other components within normal limits  NOVEL CORONAVIRUS, NAA (HOSPITAL ORDER, SEND-OUT TO REF LAB)  MAGNESIUM  PHOSPHORUS  CREATININE, SERUM  BASIC METABOLIC PANEL    EKG EKG Interpretation  Date/Time:  Friday October 21 2018 12:38:18 EDT Ventricular Rate:  71 PR Interval:    QRS Duration: 165 QT Interval:  478 QTC Calculation: 555 R Axis:   90 Text Interpretation:  Sinus rhythm Ventricular bigeminy Short PR interval IVCD, consider atypical RBBB ST-t wave abnormality Baseline wander Abnormal ECG Confirmed by Carmin Muskrat [phone]) on 10/21/2018 12:40:41 PM   Radiology No results found.  Procedures Procedures (including critical care time) CRITICAL CARE Performed by: [place] N Robinson   Total critical care time: 35 minutes  Critical care time was exclusive of separately billable procedures and treating other patients.  Critical care was necessary to treat or prevent imminent or life-threatening deterioration.  Critical care was time spent personally by me on the following activities: development of treatment plan with patient and/or surrogate as well as nursing, discussions with consultants, evaluation of patient's response to treatment, examination of patient, obtaining history from patient or surrogate, ordering and performing treatments and interventions, ordering and review of laboratory studies, ordering and review of radiographic studies, pulse oximetry and re-evaluation of patient's condition.  Medications Ordered in ED Medications  calcium gluconate 1 g in sodium chloride 0.9 % 100 mL IVPB (1 g Intravenous New Bag/Given 10/21/18 1420)  sodium bicarbonate 100 mEq in dextrose 5 % 1,000 mL infusion (has no administration in time range)  enoxaparin (LOVENOX) injection 30 mg (has no administration in time range)  insulin aspart (novoLOG) injection 5 Units (5 Units Intravenous Given 10/21/18 1352)    And   dextrose 50 % solution 50 mL (50 mLs Intravenous Given 10/21/18 1353)  sodium bicarbonate injection  50 mEq (50 mEq Intravenous Given 10/21/18 1353)     Initial Impression / Assessment and Plan / ED Course  I have reviewed the triage vital signs and the nursing notes.  Pertinent labs & imaging results that were available during my care of the patient were reviewed by me and considered in my medical decision making (see chart for details).  Clinical Course as of Oct 20 1457  Fri Oct 21, 2018  1406 Dr. Grayland Ormond with nephrology recommends Renal ultrasound to look for obstruction. bicarb drip at 100/hr. Then repeat K in 1 hour.    [JR]    Clinical Course User Index [JR] Robinson, [place] N, PA-C       Patient presenting from home with lab abnormalities, drawn by the [facility] yesterday.  No symptoms.  Labs today reveal significant hyperkalemia with a potassium of 6.5, bicarb is down at 17, AKI with creatinine of 2.28 and BUN of 84.  Patient has new EKG changes, ventricular bigeminy.  IV interventions initiated to include insulin, dextrose, sodium bicarb, and calcium gluconate.  This was discussed with Dr. Grayland Ormond with nephrology- she recommends renal ultrasound to evaluate for obstruction.  Also recommends additional bicarb drip at 100/h and repeat potassium level in 1 hour.  Unsure etiology of hyperkalemia, patient is unsure of his daily medications.  Patient admitted to hospitalist service for further management.   The patient appears reasonably stabilized for admission considering the current resources, flow, and capabilities available in the ED at this time, and I doubt any other [facility] requiring further screening and/or treatment in the ED prior to admission.  Final Clinical Impressions(s) / ED Diagnoses   Final diagnoses:  Hyperkalemia  AKI (acute kidney injury) [facility])  Ventricular bigeminy    ED Discharge Orders    None       Robinson, [place] N, PA-C 10/21/18 1501    Carmin Muskrat,  MD 10/22/18 [phone]

## 2018-10-21 NOTE — Plan of Care (Signed)
  Problem: Health Behavior/Discharge Planning: Goal: Ability to manage health-related needs will improve Outcome: Progressing   

## 2018-10-21 NOTE — Consult Note (Signed)
[facility]    NEPHROLOGY CONSULTATION NOTE  PATIENT ID:  Ricky Hayden, DOB:  [birth year]  HPI: The patient is a 73 y.o. year old male with a past medical history significant for hypertension, hyperlipidemia, COPD, gout, GERD, coronary artery disease, and intracranial hemorrhage status post craniotomy in 2007 who presented for a potassium of 7.  Potassium in the emergency department was notably 6.5 he denies any NSAID use.  He reports that he has introduced some fruits into his diet the last couple of weeks.  He is maintained on spironolactone and enalapril in the outpatient setting.  He reports feeling generally well other than fatigue, with no acute complaints.  Renal consultation has been called for hyperkalemia.  Past Medical History:  Diagnosis Date  . Arthritis    osteoarthritis of left knee  . Balanitis    recurrent  . Cardiac arrhythmia    life threatening, secondary to CCB vs b- blockers  . Chronic joint pain   . COPD (chronic obstructive pulmonary disease) ([place])   . Coronary artery disease   . Enlarged prostate   . 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 GERD (gastroesophageal reflux disease)   . Gout   . Hiatal hernia   . Hyperlipidemia   . Hypertension   . Intracranial hematoma ([place]) 1995   history of, s/p evacuation by Dr. Sherwood Gambler  . Nocturia   . PONV (postoperative nausea and vomiting)   . Sinusitis    s/p ethmoidectomy and nasal septoplasty  . Vertigo    intermitantly    Past Surgical History:  Procedure Laterality Date  . CIRCUMCISION N/A 11/20/2013   Procedure: CIRCUMCISION ADULT;  Surgeon: Claybon Jabs, MD;  Location: [facility];  Service: Urology;  Laterality: N/A;  . [place]   hematomy due to sinus infection   . shoulder surg rt   1995  . SINUS SURGERY WITH INSTATRAK     ethmoidectomy and nasal septum repair    History reviewed. No pertinent family history.  Social History   Tobacco Use  . Smoking  status: Former Smoker    Quit date: 05/04/1986    Years since quitting: 32.4  . Smokeless tobacco: Never Used  Substance Use Topics  . Alcohol use: No    Alcohol/week: 0.0 standard drinks  . Drug use: No    REVIEW OF SYSTEMS: General: Positive fatigue, no weakness Head:  no headaches Eyes:  no blurred vision ENT:  no sore throat Neck:  no masses CV:  no chest pain, no orthopnea Lungs:  no shortness of breath, no cough GI:  no nausea or vomiting, no diarrhea GU:  no dysuria or hematuria Skin:  no rashes or lesions Neuro:  no focal numbness or weakness Psych:  no depression or anxiety    PHYSICAL EXAM:  Vitals:   10/21/18 1556 10/21/18 1559  BP: 119/68   Pulse: (!) 33 67  Resp:  20  Temp: 98.3 F (36.8 C)   SpO2: 96% 97%   No intake/output data recorded.   General:  AAOx3 NAD HEENT: MMM [place] AT anicteric sclera Neck:  No JVD, no adenopathy CV:  Heart RRR  Lungs:  L/S CTA bilaterally Abd:  abd SNT/ND with normal BS GU:  Bladder non-palpable Extremities: +1 bilateral LE edema. Skin:  No skin rash Psych:  normal mood and affect Neuro:  no focal deficits   CURRENT MEDICATIONS:  . allopurinol  50 mg Oral BID  . carvedilol  25  mg Oral BID WC  . cloNIDine  0.3 mg Oral BID  . [START ON 10/22/2018] enoxaparin (LOVENOX) injection  40 mg Subcutaneous Q24H  . mometasone-formoterol  2 puff Inhalation BID  . [START ON 10/22/2018] pantoprazole  40 mg Oral Daily  . PHENObarbital  60 mg Oral BID  . pravastatin  40 mg Oral QPM  . tamsulosin  0.4 mg Oral Daily     HOME MEDICATIONS:  Prior to Admission medications   Medication Sig Start Date End Date Taking? Authorizing Provider  albuterol (PROVENTIL HFA;VENTOLIN HFA) 108 (90 BASE) MCG/ACT inhaler Inhale 2 puffs into the lungs every 6 (six) hours as needed for wheezing. 12/10/14  Yes Maryellen Pile, MD  allopurinol (ZYLOPRIM) 100 MG tablet Take 2 tablets (200 mg total) by mouth daily. 12/10/14 10/21/18 Yes Maryellen Pile, MD   budesonide-formoterol (SYMBICORT) 80-4.5 MCG/ACT inhaler Inhale 2 puffs into the lungs 2 (two) times daily. 09/17/15  Yes Corky Sox, MD  carvedilol (COREG) 25 MG tablet TAKE ONE TABLET BY MOUTH TWICE DAILY WITH MEALS 11/13/15  Yes Maryellen Pile, MD  cloNIDine (CATAPRES) 0.3 MG tablet Take 1 tablet (0.3 mg total) by mouth 2 (two) times daily. 03/05/15  Yes Maryellen Pile, MD  colchicine 0.6 MG tablet Take 1 tablet (0.6 mg total) by mouth 2 (two) times daily as needed. Use only during gout flare 03/05/14  Yes Kelby Aline, MD  docusate sodium (COLACE) 100 MG capsule Take 1-2 capsules (100-200 mg total) by mouth 2 (two) times daily as needed for mild constipation. 03/05/14  Yes Kelby Aline, MD  finasteride (PROSCAR) 5 MG tablet TAKE ONE TABLET BY MOUTH ONCE DAILY 03/05/15  Yes Maryellen Pile, MD  furosemide (LASIX) 20 MG tablet TAKE ONE TABLET BY MOUTH ONCE DAILY Patient taking differently: Take 40 mg by mouth daily.  12/27/15  Yes Maryellen Pile, MD  Multiple Vitamins-Minerals (ICAPS AREDS 2 PO) Take 2 capsules by mouth daily.   Yes [provider]  omeprazole (PRILOSEC) 20 MG capsule Take 1 capsule (20 mg total) by mouth daily. 12/10/14  Yes Maryellen Pile, MD  PHENobarbital (LUMINAL) 64.8 MG tablet Take 1 tablet (64.8 mg total) by mouth 2 (two) times daily. 07/24/15  Yes Corky Sox, MD  polyethylene glycol [facility] / Floria Raveling) packet Take 17 g by mouth daily as needed for moderate constipation. 12/10/14  Yes Maryellen Pile, MD  pravastatin (PRAVACHOL) 40 MG tablet Take 1 tablet (40 mg total) by mouth every evening. 12/10/14  Yes Maryellen Pile, MD  sildenafil (VIAGRA) 100 MG tablet Take 0.5 tablets (50 mg total) by mouth as needed for erectile dysfunction. 07/16/14 10/21/18 Yes Kelby Aline, MD  SPIRIVA HANDIHALER 18 MCG inhalation capsule INHALE ONE DOSE BY MOUTH ONCE DAILY Patient taking differently: Place 18 mcg into inhaler and inhale daily.  09/27/15  Yes Maryellen Pile, MD   tamsulosin (FLOMAX) 0.4 MG CAPS capsule Take 1 capsule (0.4 mg total) by mouth daily. 03/05/15  Yes Maryellen Pile, MD  enalapril (VASOTEC) 20 MG tablet Take 2 tablets (40 mg total) by mouth every evening. Patient not taking: Reported on 10/21/2018 04/01/15   Maryellen Pile, MD  spironolactone (ALDACTONE) 25 MG tablet Take 1 tablet (25 mg total) by mouth 2 (two) times daily. Patient not taking: Reported on 10/21/2018 09/17/15 09/16/16  Corky Sox, MD       LABS:  CBC Latest Ref Rng & Units 10/21/2018 06/29/2018 03/07/2014  WBC 4.0 - 10.5 K/uL 6.0 6.8 6.0  Hemoglobin 13.0 -  17.0 g/dL 11.7(L) 13.3 14.6  Hematocrit 39.0 - 52.0 % 37.0(L) 40.6 44.0  Platelets 150 - 400 K/uL 113(L) 140(L) 135(L)    CMP Latest Ref Rng & Units 10/21/2018 10/21/2018 06/29/2018  Glucose 70 - 99 mg/dL - 130(H) 105(H)  BUN 8 - 23 mg/dL - 84(H) 61(H)  Creatinine 0.61 - 1.24 mg/dL 2.29(H) 2.28(H) 1.70(H)  Sodium 135 - 145 mmol/L - 138 138  Potassium 3.5 - 5.1 mmol/L - 6.5(HH) 5.5(H)  Chloride 98 - 111 mmol/L - 114(H) 106  CO2 22 - 32 mmol/L - 17(L) 19(L)  Calcium 8.9 - 10.3 mg/dL - 9.6 9.5  Total Protein 6.5 - 8.1 g/dL - 7.2 -  Total Bilirubin 0.3 - 1.2 mg/dL - 0.3 -  Alkaline Phos 38 - 126 U/L - 57 -  AST 15 - 41 U/L - 17 -  ALT 0 - 44 U/L - 22 -    Lab Results  Component Value Date   CALCIUM 9.6 10/21/2018   PHOS 4.3 10/21/2018       Component Value Date/Time   COLORURINE ORANGE (A) 10/05/2012 1500   APPEARANCEUR Clear 04/01/2015 1427   LABSPEC 1.027 10/05/2012 1500   PHURINE 6.0 10/05/2012 1500   GLUCOSEU Negative 04/01/2015 1427   GLUCOSEU NEG mg/dL 01/10/2008 2127   HGBUR NEG 10/05/2012 1500   BILIRUBINUR Negative 04/01/2015 1427   KETONESUR TRACE (A) 10/05/2012 1500   PROTEINUR Negative 04/01/2015 1427   PROTEINUR NEG 10/05/2012 1500   UROBILINOGEN 1 10/05/2012 1500   NITRITE Negative 04/01/2015 1427   NITRITE NEG 10/05/2012 1500   LEUKOCYTESUR Negative 04/01/2015 1427   No results found  for: PHART, PCO2ART, PO2ART, HCO3, TCO2, ACIDBASEDEF, O2SAT  No results found for: IRON, TIBC, FERRITIN, IRONPCTSAT     ASSESSMENT/PLAN:    The patient is a 73 y.o. year old male with a past medical history significant for hypertension, hyperlipidemia, COPD, gout, GERD, coronary artery disease, and intracranial hemorrhage status post craniotomy in 2007 who presented for a potassium of 7.  Potassium in the emergency department was notably 6.5 he denies any NSAID use.  He reports that he has introduced some fruits into his diet the last couple of weeks.  He is maintained on spironolactone and enalapril in the outpatient setting.  1.  Hyperkalemia.  Likely secondary to the combination of enalapril and spironolactone.  He received calcium gluconate, insulin, D50, and bicarb in the emergency department.  We will start a bicarbonate drip.  Will repeat potassium.  2.  Chronic kidney disease stage III.  His baseline serum creatinine runs around 1.7.  His chronic kidney disease likely on the basis of longstanding hypertension.  3.  Acute kidney injury.  Likely on the basis of overdiuresis.  Will check a urinalysis.  Renal ultrasound revealed no evidence of hydronephrosis.  4.  Hypertension.  Blood pressures are now on the low side.  Enalapril and spironolactone have been discontinued.  [place], DO, [place]

## 2018-10-21 NOTE — H&P (Signed)
Date: 10/21/2018               Patient Name:  Ricky Hayden MRN: [id number]  DOB: [birth year] Age / Sex: 73 y.o., male   PCP: Patient, No Pcp Per         Medical Service: Internal Medicine Teaching Service         Attending Physician: Dr. Dareen Piano    First Contact: Dr. Eileen Stanford Pager: [phone]  Second Contact: Dr. Berline Lopes Pager: [phone]       After Hours (After 5p/  First Contact Pager: [phone]  weekends / holidays): Second Contact Pager: [phone]   Chief Complaint: Abnormal labs (Hyperkalemia K+ 7)  History of Present Illness: Mr. Perreira is a 73 year old gentleman with hypertension, hyperlipidemia, COPD, GERD, gout, Coronary artery disease, intracranial hemorrhage status post craniotomy in 2007 (on phenobarbital for seizure prophylaxis).  He presented to [facility] emergency department on 10/21/2018 at the behest of [place] after he had some abnormal labs.  On speaking to his wife, she states that his potassium was 7 when she received a call.  Patient currently denies chest pain, palpitation, dysrhythmia, shortness of breath, nausea, vomiting, dizziness, headaches, lightheadedness.  He also does not report any recent NSAIDs use, new medications, herbal medications, over-the-counter medications, muscle pain or cramps.  He did mention that over the several weeks his p.o. intake has decreased due to decreased appetite however he has maintained adequate intake of fluid.  Per chart review, he presented to the emergency department on July 01, 2018 with chest pain.  His lab work did show hyperkalemia of 5.5, BUN of 61 and serum creatinine of 1.7.  He was offered admission however declined and stated that [facility] would follow-up with him.  ED course: Afebrile, HR 30s-70s, BP 110s-120s/40s-60s, RR 13-20, MIP 60s-80s, SPO2 97% on room air.  Labs reviewed sodium 138, potassium 6.5, chloride 114, bicarb 17, BUN 84, creatinine 2.28, hemoglobin 11.7, hematocrit 37, platelet 113.   EKG showed ventricular bigeminy, telemetry monitor showed results of PVCs and ventricular bigeminy.  He received IV calcium gluconate, 5 units of NovoLog, D50, amp of sodium bicarb.  Meds:  Current Meds  Medication Sig  . albuterol (PROVENTIL HFA;VENTOLIN HFA) 108 (90 BASE) MCG/ACT inhaler Inhale 2 puffs into the lungs every 6 (six) hours as needed for wheezing.  Marland Kitchen allopurinol (ZYLOPRIM) 100 MG tablet Take 2 tablets (200 mg total) by mouth daily.  . budesonide-formoterol (SYMBICORT) 80-4.5 MCG/ACT inhaler Inhale 2 puffs into the lungs 2 (two) times daily.  . carvedilol (COREG) 25 MG tablet TAKE ONE TABLET BY MOUTH TWICE DAILY WITH MEALS  . cloNIDine (CATAPRES) 0.3 MG tablet Take 1 tablet (0.3 mg total) by mouth 2 (two) times daily.  . colchicine 0.6 MG tablet Take 1 tablet (0.6 mg total) by mouth 2 (two) times daily as needed. Use only during gout flare  . docusate sodium (COLACE) 100 MG capsule Take 1-2 capsules (100-200 mg total) by mouth 2 (two) times daily as needed for mild constipation.  . finasteride (PROSCAR) 5 MG tablet TAKE ONE TABLET BY MOUTH ONCE DAILY  . furosemide (LASIX) 20 MG tablet TAKE ONE TABLET BY MOUTH ONCE DAILY (Patient taking differently: Take 40 mg by mouth daily. )  . Multiple Vitamins-Minerals (ICAPS AREDS 2 PO) Take 2 capsules by mouth daily.  Marland Kitchen omeprazole (PRILOSEC) 20 MG capsule Take 1 capsule (20 mg total) by mouth daily.  Marland Kitchen PHENobarbital (LUMINAL) 64.8 MG tablet Take 1 tablet (64.8 mg total)  by mouth 2 (two) times daily.  . polyethylene glycol (MIRALAX / GLYCOLAX) packet Take 17 g by mouth daily as needed for moderate constipation.  . pravastatin (PRAVACHOL) 40 MG tablet Take 1 tablet (40 mg total) by mouth every evening.  . sildenafil (VIAGRA) 100 MG tablet Take 0.5 tablets (50 mg total) by mouth as needed for erectile dysfunction.  Marland Kitchen SPIRIVA HANDIHALER 18 MCG inhalation capsule INHALE ONE DOSE BY MOUTH ONCE DAILY (Patient taking differently: Place 18 mcg into  inhaler and inhale daily. )  . tamsulosin (FLOMAX) 0.4 MG CAPS capsule Take 1 capsule (0.4 mg total) by mouth daily.     Allergies: Allergies as of 10/21/2018 - Review Complete 06/29/2018  Allergen Reaction Noted  . Calcium channel blockers Other (See Comments) 04/14/2012   Past Medical History:  Diagnosis Date  . Arthritis    osteoarthritis of left knee  . Balanitis    recurrent  . Cardiac arrhythmia    life threatening, secondary to CCB vs b- blockers  . Chronic joint pain   . COPD (chronic obstructive pulmonary disease) ([place])   . Coronary artery disease   . Enlarged prostate   . 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 GERD (gastroesophageal reflux disease)   . Gout   . Hiatal hernia   . Hyperlipidemia   . Hypertension   . Intracranial hematoma ([place]) 1995   history of, s/p evacuation by Dr. Sherwood Gambler  . Nocturia   . PONV (postoperative nausea and vomiting)   . Sinusitis    s/p ethmoidectomy and nasal septoplasty  . Vertigo    intermitantly    Family History: Father passed from myocardial infarction at age 75  Social History: Retired from [employer].  He served in [place].  Currently denies cigarette use, EtOH use or illicit drug use.  His last use of cigarettes was 1988, EtOH was 1980.  Review of Systems: A complete ROS was negative except as per HPI.   Physical Exam: Blood pressure (!) 107/57, pulse (!) 34, temperature 98.1 F (36.7 C), temperature source Oral, resp. rate 20, height 5\' 7"  (1.702 m), weight 110.2 kg, SpO2 96 %. Physical Exam Vitals signs and nursing note reviewed.  Constitutional:      General: He is not in acute distress.    Appearance: He is obese. He is not toxic-appearing or diaphoretic.  HENT:     Head: Normocephalic and atraumatic.  Neck:     Musculoskeletal: Neck supple.  Cardiovascular:     Rate and Rhythm: Rhythm irregular.     Heart sounds: No murmur.     Comments: Distant heart sounds Pulmonary:      Effort: Pulmonary effort is normal.     Breath sounds: Wheezing present. No rhonchi or rales.  Abdominal:     Tenderness: There is no abdominal tenderness.     Comments: Protruded abdomen due to body habitus.  Neurological:     Mental Status: He is alert.     EKG: personally reviewed my interpretation is ventricular bigeminy  Assessment & Plan by Problem: Active Problems:   Acute renal injury [facility])  Mr. Kitagawa is a 73 year old gentleman with HTN, HLD, COPD, GERD, gout, intracranial hemorrhage status post craniotomy in 2007 (on phenobarbital for seizure prophylaxis), CAD here for management of hyperkalemia and worsening renal function  #Acute on chronic renal failure #Hyperkalemia #Normal anion gap hyperchloremic metabolic acidosis Outpatient laboratory work-up shows potassium of 7 and was advised to present to the  emergency department.  On arrival to ED, serum potassium was 6.5, BUN 84, creatinine 2.28.  He is completely asymptomatic and does not report of dehydration.  He does have a prior episode of hyperkalemia and renal insufficiency several months when he presented to the ED.  He has hypertension and takes an ACE inhibitor and a potassium sparing diuretic, enalapril and spironolactone respectively which is most likely the reason for his current renal insult.  Denies recent NSAID use.  He does not have any signs of hemolysis as he does not report hematuria, no skin pallor. -Status post IV calcium gluconate, NovoLog 5 units, D50 and sodium bicarb -Appreciate nephrology recommendation -Per nephrology, start sodium bicarb infusion -Follow-up BMP -Cardiac monitoring -Follow-up renal ultrasound  #Hypertension: Home medications include enalapril, spironolactone, Lasix, clonidine. -Continue to hold enalapril and spironolactone.  Moving forward, these medications will have to be discontinued.  #Coronary artery disease: - Continue Coreg, pravastatin  #Thrombocytopenia: He has a  history of intracranial hemorrhage and is status post craniotomy in 2007.  He was placed on phenobarbital for seizure prophylaxis.  Platelet count at this admission was 113 (baseline 130s-150s).  His AED could be playing a role -Hold phenobarbital  #COPD: Continue Dulera, Spiriva  #Gout: Hold colchicine  #BPH: Continue Flomax   FEN: Replace electrolytes as needed, Renal diet  VTE ppx: Subcutaneous Lovenox CODE STATUS: Full code  Dispo: Admit patient to Inpatient with expected length of stay greater than 2 midnights.  Signed: Jean Rosenthal, MD 10/21/2018, 3:11 PM  Pager: [phone] Internal Medicine Teaching Service

## 2018-10-22 LAB — BASIC METABOLIC PANEL
Anion gap: 8 (ref 5–15)
Anion gap: 8 (ref 5–15)
BUN: 77 mg/dL — ABNORMAL HIGH (ref 8–23)
BUN: 68 mg/dL — ABNORMAL HIGH (ref 8–23)
CO2: 25 mmol/L (ref 22–32)
CO2: 30 mmol/L (ref 22–32)
Calcium: 9.4 mg/dL (ref 8.9–10.3)
Calcium: 9.4 mg/dL (ref 8.9–10.3)
Chloride: 105 mmol/L (ref 98–111)
Chloride: 101 mmol/L (ref 98–111)
Creatinine, Ser: 1.97 mg/dL — ABNORMAL HIGH (ref 0.61–1.24)
Creatinine, Ser: 2.03 mg/dL — ABNORMAL HIGH (ref 0.61–1.24)
GFR calc Af Amer: 38 mL/min — ABNORMAL LOW (ref >60)
GFR calc Af Amer: 36 mL/min — ABNORMAL LOW (ref >60)
GFR calc non Af Amer: 33 mL/min — ABNORMAL LOW (ref >60)
GFR calc non Af Amer: 31 mL/min — ABNORMAL LOW (ref >60)
Glucose, Bld: 133 mg/dL — ABNORMAL HIGH (ref 70–99)
Glucose, Bld: 177 mg/dL — ABNORMAL HIGH (ref 70–99)
Potassium: 5 mmol/L (ref 3.5–5.1)
Potassium: 4.5 mmol/L (ref 3.5–5.1)
Sodium: 138 mmol/L (ref 135–145)
Sodium: 139 mmol/L (ref 135–145)

## 2018-10-22 LAB — NOVEL CORONAVIRUS, NAA (HOSP ORDER, SEND-OUT TO REF LAB; TAT 18-24 HRS): SARS-CoV-2, NAA: NOT DETECTED

## 2018-10-22 MED ORDER — FUROSEMIDE 20 MG PO TABS
20.0000 mg | ORAL_TABLET | Freq: Every day | ORAL | Status: DC
  Administered 2018-10-22: 20 mg via ORAL
  Filled 2018-10-22: qty 1

## 2018-10-22 MED ORDER — FUROSEMIDE 10 MG/ML IJ SOLN
20.0000 mg | Freq: Once | INTRAMUSCULAR | Status: AC
  Administered 2018-10-22: 20 mg via INTRAVENOUS
  Filled 2018-10-22: qty 2

## 2018-10-22 MED ORDER — ALBUTEROL SULFATE HFA 108 (90 BASE) MCG/ACT IN AERS
2.0000 | INHALATION_SPRAY | RESPIRATORY_TRACT | Status: DC | PRN
  Administered 2018-10-22: 2 via RESPIRATORY_TRACT
  Filled 2018-10-22: qty 6.7

## 2018-10-22 NOTE — Progress Notes (Signed)
  Date: 10/22/2018  Patient name: Ricky Hayden  Medical record number: [id number]  Date of birth: 07-05-1945   I have seen and evaluated Ricky Hayden and discussed their care with the Residency Team.  In brief, patient is 73 year old male with past medical history of hypertension, hyperlipidemia, COPD, GERD, gout, CAD, intracranial hemorrhage status post craniotomy in 2007 who presented to the ED for hyperkalemia from his nephrologist office.  Per chart, patient's wife received a phone call stating that the patient's potassium was 7 and was told to come to the ED.  Patient denied any symptoms.  No chest pain, no shortness of breath, no palpitations, diaphoresis, no lightheadedness, no syncope, no focal weakness, tingling or numbness, no blurry vision, no headache, no nausea or vomiting, no fevers, no diarrhea.  Today patient states that he feels well and has no new complaints except that he is hungry.  PMHx, Fam Hx, and/or Soc Hx : As per resident admit note  Vitals:   10/22/18 0642 10/22/18 0915  BP: 112/72 116/71  Pulse: 71 68  Resp: 18 18  Temp: 98.2 F (36.8 C) 98.1 F (36.7 C)  SpO2: 97% 98%   Physical Exam  Constitutional: He is oriented to person, place, and time and well-developed, well-nourished, and in no distress.  HENT:  Head: Normocephalic and atraumatic.  Eyes: Right eye exhibits no discharge. Left eye exhibits no discharge.  Cardiovascular: Normal rate, regular rhythm and normal heart sounds.  Pulmonary/Chest: Effort normal and breath sounds normal. No respiratory distress. He has no wheezes.  Abdominal: Soft. Bowel sounds are normal. He exhibits no distension. There is no abdominal tenderness.  Musculoskeletal:        General: No edema.  Neurological: He is alert and oriented to person, place, and time.  Skin: Skin is warm and dry.  Psychiatric: Mood and affect normal.    Assessment and Plan: I have seen and evaluated the patient as outlined above. I agree with  the formulated Assessment and Plan as detailed in the residents' note, with the following changes:   1.  Severe hyperkalemia, AKI on CKD: -Patient presented to ED with hyperkalemia on outpatient labs and was found to have a potassium of 6.5 on admission.  He was also noted to have an elevated creatinine of 2.28 on admission (baseline creatinine of 1.7).  I suspect hyperkalemia secondary to outpatient medications including enalapril and spironolactone.  He also appears to have been over diuresed which would explain his AKI on CKD. -Patient was given calcium gluconate, insulin and D50 as well as Lokelma, bicarbonate infusion and loop diuretics and his potassium has improved to 5 this morning. -Nephrology follow-up and recommendations appreciated -We will continue to hold enalapril and spironolactone for now -Continue with loop diuretic -We will DC sodium bicarbonate infusion and monitor potassium levels -Continue with Lokelma twice daily -Patient's creatinine has slowly improved to 1.97 which is close to his baseline 1.7.  Renal sono shows no evidence of hydronephrosis -We will follow-up repeat BMP this evening as well as tomorrow morning -Patient blood pressure is well controlled currently on current medications.  We will continue to monitor -No further work-up at this time -I suspect patient should be stable for DC home tomorrow  Aldine Contes, MD 6/20/20201:01 PM

## 2018-10-22 NOTE — Progress Notes (Signed)
[facility]    NEPHROLOGY PROGRESS NOTE  SUBJECTIVE: Patient seen and examined.  Complains of some slight shortness of breath.  Denies chest pain, nausea, vomiting, diarrhea or dysuria.  Reports excellent urine output.  All other review of systems are negative.  OBJECTIVE:  Vitals:   10/22/18 0642 10/22/18 0915  BP: 112/72 116/71  Pulse: 71 68  Resp: 18 18  Temp: 98.2 F (36.8 C) 98.1 F (36.7 C)  SpO2: 97% 98%    Intake/Output Summary (Last 24 hours) at 10/22/2018 1152 Last data filed at 10/22/2018 0900 Gross per 24 hour  Intake 1460.21 ml  Output 1900 ml  Net -439.79 ml      General:  AAOx3 NAD HEENT: MMM  AT anicteric sclera Neck:  No JVD, no adenopathy CV:  Heart RRR  Lungs:  L/S CTA bilaterally Abd:  abd SNT/ND with normal BS, obese GU:  Bladder non-palpable Extremities: There is bilateral LE edema. Skin:  No skin rash  MEDICATIONS:  . allopurinol  50 mg Oral BID  . carvedilol  25 mg Oral BID WC  . cloNIDine  0.3 mg Oral BID  . enoxaparin (LOVENOX) injection  40 mg Subcutaneous Q24H  . furosemide  20 mg Intravenous Once  . furosemide  20 mg Oral Daily  . mometasone-formoterol  2 puff Inhalation BID  . pantoprazole  40 mg Oral Daily  . PHENObarbital  60 mg Oral BID  . pravastatin  40 mg Oral QPM  . sodium zirconium cyclosilicate  10 g Oral BID  . tamsulosin  0.4 mg Oral Daily       LABS:   CBC Latest Ref Rng & Units 10/21/2018 06/29/2018 03/07/2014  WBC 4.0 - 10.5 K/uL 6.0 6.8 6.0  Hemoglobin 13.0 - 17.0 g/dL 11.7(L) 13.3 14.6  Hematocrit 39.0 - 52.0 % 37.0(L) 40.6 44.0  Platelets 150 - 400 K/uL 113(L) 140(L) 135(L)    CMP Latest Ref Rng & Units 10/22/2018 10/21/2018 10/21/2018  Glucose 70 - 99 mg/dL 133(H) 79 94  BUN 8 - 23 mg/dL 77(H) 81(H) 83(H)  Creatinine 0.61 - 1.24 mg/dL 1.97(H) 2.08(H) 2.21(H)  Sodium 135 - 145 mmol/L 138 139 139  Potassium 3.5 - 5.1 mmol/L 5.0 5.3(H) 6.8(HH)  Chloride 98 - 111 mmol/L 105 109 112(H)  CO2 22  - 32 mmol/L 25 23 23   Calcium 8.9 - 10.3 mg/dL 9.4 10.1 10.1  Total Protein 6.5 - 8.1 g/dL - - -  Total Bilirubin 0.3 - 1.2 mg/dL - - -  Alkaline Phos 38 - 126 U/L - - -  AST 15 - 41 U/L - - -  ALT 0 - 44 U/L - - -    Lab Results  Component Value Date   CALCIUM 9.4 10/22/2018   PHOS 4.3 10/21/2018       Component Value Date/Time   COLORURINE ORANGE (A) 10/05/2012 1500   APPEARANCEUR Clear 04/01/2015 1427   LABSPEC 1.027 10/05/2012 1500   PHURINE 6.0 10/05/2012 1500   GLUCOSEU Negative 04/01/2015 1427   GLUCOSEU NEG mg/dL 01/10/2008 2127   HGBUR NEG 10/05/2012 1500   BILIRUBINUR Negative 04/01/2015 1427   KETONESUR TRACE (A) 10/05/2012 1500   PROTEINUR Negative 04/01/2015 1427   PROTEINUR NEG 10/05/2012 1500   UROBILINOGEN 1 10/05/2012 1500   NITRITE Negative 04/01/2015 1427   NITRITE NEG 10/05/2012 1500   LEUKOCYTESUR Negative 04/01/2015 1427   No results found for: PHART, PCO2ART, PO2ART, HCO3, TCO2, ACIDBASEDEF, O2SAT  No results found for: IRON, TIBC, FERRITIN,  IRONPCTSAT     ASSESSMENT/PLAN:     The patient is a 73 y.o. year old male with a past medical history significant for hypertension, hyperlipidemia, COPD, gout, GERD, coronary artery disease, and intracranial hemorrhage status post craniotomy in 2007 who presented for a potassium of 7.  Potassium in the emergency department was notably 6.5 he denies any NSAID use.  He reports that he has introduced some fruits into his diet the last couple of weeks.  He is maintained on spironolactone and enalapril in the outpatient setting.  1.  Hyperkalemia.  Likely secondary to the combination of enalapril and spironolactone.    Potassium has improved.  Will restart furosemide.  We will hold off on Aldactone and ACE inhibition.  Urine output is excellent.  2.  Chronic kidney disease stage III.  His baseline serum creatinine runs around 1.7.  His chronic kidney disease likely on the basis of longstanding hypertension.  Renal  function is improving toward baseline.  3.  Acute kidney injury.  Likely on the basis of overdiuresis.    Urinalysis is pending.  Renal ultrasound revealed no evidence of hydronephrosis.  4.  Hypertension.  Blood pressures are stable. Enalapril and spironolactone have been discontinued.  Continue furosemide.   [place], DO, [place]

## 2018-10-22 NOTE — Progress Notes (Signed)
   Subjective: Patient stated that he felt well overnight but still hungry.  He was lying in his bed watching TV concerned with the fact that he was hungry predominantly.  He denied other acute concerns.  He denied headache, chest pain, discomfort, abdominal pain, palpitations.  Objective:  Vital signs in last 24 hours: Vitals:   10/21/18 1556 10/21/18 1559 10/21/18 2206 10/22/18 0642  BP: 119/68  118/63 112/72  Pulse: (!) 33 67 62 71  Resp:  20 16 18   Temp: 98.3 F (36.8 C)  98.4 F (36.9 C) 98.2 F (36.8 C)  TempSrc: Oral  Oral Oral  SpO2: 96% 97% 100% 97%  Weight: 109 kg     Height:       General: A/O x4, in no acute distress, afebrile, nondiaphoretic Cardio: Distant heart sounds no distinct mrg's  Pulmonary: CTA bilaterally, no wheezing or crackles  Abdomen: Bowel sounds normal, soft, nontender  MSK: BLE nontender, nonedematous Psych: Appropriate affect, not depressed in appearance, engages well  Assessment/Plan:  Active Problems:   Acute renal injury [facility])  Patient encounter summary: Ricky Hayden is a 73 year old gentleman with a past medical history notable for HTN, HLD, COPD, GERD, gout, intracranial hemorrhage status post craniotomy in 2007 (on chronic phenobarbital for seizure prophylaxis) and longstanding history of CAD who presented for severe hyperkalemia and increased serum creatinine as measured at his outpatient nephrologist office.  He is admitted for treatment and evaluation of hyperkalemia with notable improvement from 6.8 at the highest to 5.0 within 16 hours.  A/P: Hyperkalemia and non-gap hyperchloremic metabolic acidosis: Acute on chronic renal failure: Marked improvement in the patient's potassium overnight following treatment with copious bicarbonate infusion, Lokelma, and loop diuretics.  He was given calcium gluconate, insulin D50, and other temporizing measures as needed.  The patient continues to deny symptoms.  Patient had a serum creatinine of 1.7 in  February of this year now 1.97 this a.m. likely secondary to excessive diuresis resulting non-gap hyperchloremic metabolic acidosis with resulting hyperkalemia.  Like to keep the patient overnight to ensure resolution of his hyperkalemia and continued treatment of his acute renal injury. - Appreciate nephrology's recommendations - Discontinued sodium bicarbonate infusion -Repeat BMP at 1700 hrs. on 6/20, will resume bicarb drip if potassium above 5.6 at 75 ML's an hour with 100 mEq/L in D5W -Repeat BMP at 0500 hrs. on 6/21 -Agree with continue Lokelma 10g twice daily  Hypertension: BP currently controlled at 112/72. -Continue to monitor -Continue clonidine 0.3 mg twice daily -Continue carvedilol 25 mg twice daily  Code: Full Diet: Renal Fluids: None currently DVT P PX: Enoxaparin Dispo: Anticipated discharge in approximately 0-1 day(s).   Ricky Ludwig, MD 10/22/2018, 9:10 AM Pager: Pager# [phone]

## 2018-10-23 DIAGNOSIS — E875 Hyperkalemia: Secondary | ICD-10-CM

## 2018-10-23 LAB — BASIC METABOLIC PANEL
Anion gap: 10 (ref 5–15)
BUN: 59 mg/dL — ABNORMAL HIGH (ref 8–23)
CO2: 28 mmol/L (ref 22–32)
Calcium: 9.4 mg/dL (ref 8.9–10.3)
Chloride: 102 mmol/L (ref 98–111)
Creatinine, Ser: 1.88 mg/dL — ABNORMAL HIGH (ref 0.61–1.24)
GFR calc Af Amer: 40 mL/min — ABNORMAL LOW (ref >60)
GFR calc non Af Amer: 34 mL/min — ABNORMAL LOW (ref >60)
Glucose, Bld: 111 mg/dL — ABNORMAL HIGH (ref 70–99)
Potassium: 4.7 mmol/L (ref 3.5–5.1)
Sodium: 140 mmol/L (ref 135–145)

## 2018-10-23 NOTE — Progress Notes (Signed)
   Subjective: HD#2   Overnight: No acute overnight events reported.  Today, [name] examined at bedside was found comfortably lying in bed.  He states he is doing really well and slept on and off overnight.  He denies any complaints such as palpitation, shortness of breath.  He is tolerating his diet and has been able to ambulate in the room without any difficulties.  He is going to go home to see his wife, children and grandkids.  I instructed him and gave him an update as to why he had a high potassium level. he was advised to hold enalapril and spironolactone for now until he is evaluated by nephrology and PCP.  Objective:  Vital signs in last 24 hours: Vitals:   10/22/18 0915 10/22/18 1628 10/22/18 1754 10/22/18 2004  BP: 116/71 113/72  128/72  Pulse: 68 89  83  Resp: 18 18  18   Temp: 98.1 F (36.7 C) 98.2 F (36.8 C)  98.3 F (36.8 C)  TempSrc: Oral Oral  Oral  SpO2: 98% 95% 95% 93%  Weight:    108.8 kg  Height:       Const: In no apparent distress, lying comfortably in bed, conversational Resp: Lung auscultation noticeable for wheezes at the posterior lobes  Assessment/Plan:  Active Problems:   Acute renal injury [facility])  Mr. Mirabile is a 73 year old gentleman with a past medical history notable for HTN, HLD, COPD, GERD, gout, intracranial hemorrhage status post craniotomy in 2007 (on chronic phenobarbital for seizure prophylaxis) and longstanding history of CAD who presented for severe hyperkalemia and increased serum creatinine as measured at his outpatient nephrologist office.  He is admitted for treatment and evaluation of hyperkalemia with notable improvement from 6.8 at the highest to 5.0 within 16 hours.  A/P: Hyperkalemia and non-gap hyperchloremic metabolic acidosis: Acute on chronic renal failure: He is doing really well this morning and denies any new complaints.  He is tolerating his diet and ambulating. K+ this am 4.7, sCr is somewhat stable at 1.8 (<<2).   Baseline approximately 1.7.  He is stable to discharge today and have been instructed to hold enalapril spironolactone for now until he received further evaluation from [facility] nephrology and PCP.  He expressed understanding. -Appreciate nephrology's recommendations  Hypertension: BP this a.m. stable. -Continue clonidine 0.3 mg twice daily -Continue carvedilol 25 mg twice daily -Continue Lasix 20 mg daily  Code: Full Diet: Renal Fluids: None currently DVT P PX: Enoxaparin Dispo: Anticipated discharge today.   Jean Rosenthal, MD 10/23/2018, 6:48 AM Pager: [phone] Internal Medicine Teaching Service

## 2018-10-23 NOTE — Progress Notes (Signed)
Ricky Hayden to be discharged Home per MD order. Discussed prescriptions and follow up appointments with the patient. Prescriptions given to patient; medication list explained in detail. Patient verbalized understanding.  Skin clean, dry and intact without evidence of skin break down, no evidence of skin tears noted. IV catheter discontinued intact. Site without signs and symptoms of complications. Dressing and pressure applied. Pt denies pain at the site currently. No complaints noted.  Patient free of lines, drains, and wounds.   An After Visit Summary (AVS) was printed and given to the patient. Patient escorted via wheelchair, and discharged home via private auto.  Shela Commons, RN

## 2019-08-08 ENCOUNTER — Telehealth: Payer: Self-pay | Admitting: Cardiology

## 2019-08-08 NOTE — Telephone Encounter (Signed)
       I went iin the pt's chart to try and find out who called pt.

## 2019-08-10 ENCOUNTER — Other Ambulatory Visit: Payer: Self-pay

## 2019-08-10 ENCOUNTER — Telehealth (INDEPENDENT_AMBULATORY_CARE_PROVIDER_SITE_OTHER): Payer: No Typology Code available for payment source | Admitting: Internal Medicine

## 2019-08-10 ENCOUNTER — Telehealth: Payer: Self-pay

## 2019-08-10 VITALS — Ht 68.0 in | Wt 251.0 lb

## 2019-08-10 DIAGNOSIS — R0789 Other chest pain: Secondary | ICD-10-CM | POA: Diagnosis not present

## 2019-08-10 DIAGNOSIS — I5022 Chronic systolic (congestive) heart failure: Secondary | ICD-10-CM | POA: Diagnosis not present

## 2019-08-10 DIAGNOSIS — J449 Chronic obstructive pulmonary disease, unspecified: Secondary | ICD-10-CM

## 2019-08-10 DIAGNOSIS — N189 Chronic kidney disease, unspecified: Secondary | ICD-10-CM

## 2019-08-10 DIAGNOSIS — R0600 Dyspnea, unspecified: Secondary | ICD-10-CM | POA: Diagnosis not present

## 2019-08-10 DIAGNOSIS — I429 Cardiomyopathy, unspecified: Secondary | ICD-10-CM | POA: Diagnosis not present

## 2019-08-10 DIAGNOSIS — R002 Palpitations: Secondary | ICD-10-CM

## 2019-08-10 DIAGNOSIS — I447 Left bundle-branch block, unspecified: Secondary | ICD-10-CM

## 2019-08-10 DIAGNOSIS — I493 Ventricular premature depolarization: Secondary | ICD-10-CM

## 2019-08-10 DIAGNOSIS — E875 Hyperkalemia: Secondary | ICD-10-CM

## 2019-08-10 NOTE — Telephone Encounter (Signed)
  Patient Consent for Virtual Visit         Ricky Hayden has provided verbal consent on 08/10/2019 for a virtual visit (video or telephone).   CONSENT FOR VIRTUAL VISIT FOR:  Ricky Hayden  By participating in this virtual visit I agree to the following:  I hereby voluntarily request, consent and authorize [facility] and its employed or contracted physicians, physician assistants, nurse practitioners or other licensed health care professionals (the Practitioner), to provide me with telemedicine health care services (the "Services") as deemed necessary by the treating Practitioner. I acknowledge and consent to receive the Services by the Practitioner via telemedicine. I understand that the telemedicine visit will involve communicating with the Practitioner through live audiovisual communication technology and the disclosure of certain medical information by electronic transmission. I acknowledge that I have been given the opportunity to request an in-person assessment or other available alternative prior to the telemedicine visit and am voluntarily participating in the telemedicine visit.  I understand that I have the right to withhold or withdraw my consent to the use of telemedicine in the course of my care at any time, without affecting my right to future care or treatment, and that the Practitioner or I may terminate the telemedicine visit at any time. I understand that I have the right to inspect all information obtained and/or recorded in the course of the telemedicine visit and may receive copies of available information for a reasonable fee.  I understand that some of the potential risks of receiving the Services via telemedicine include:  Marland Kitchen Delay or interruption in medical evaluation due to technological equipment failure or disruption; . Information transmitted may not be sufficient (e.g. poor resolution of images) to allow for appropriate medical decision making by the Practitioner;  and/or  . In rare instances, security protocols could fail, causing a breach of personal health information.  Furthermore, I acknowledge that it is my responsibility to provide information about my medical history, conditions and care that is complete and accurate to the best of my ability. I acknowledge that Practitioner's advice, recommendations, and/or decision may be based on factors not within their control, such as incomplete or inaccurate data provided by me or distortions of diagnostic images or specimens that may result from electronic transmissions. I understand that the practice of medicine is not an exact science and that Practitioner makes no warranties or guarantees regarding treatment outcomes. I acknowledge that a copy of this consent can be made available to me via my patient portal ([facility]), or I can request a printed copy by calling the office of [facility].    I understand that my insurance will be billed for this visit.   I have read or had this consent read to me. . I understand the contents of this consent, which adequately explains the benefits and risks of the Services being provided via telemedicine.  . I have been provided ample opportunity to ask questions regarding this consent and the Services and have had my questions answered to my satisfaction. . I give my informed consent for the services to be provided through the use of telemedicine in my medical care

## 2019-08-10 NOTE — Addendum Note (Signed)
Addended by: Thora Lance on: 08/10/2019 08:42 PM   Modules accepted: Orders

## 2019-08-10 NOTE — Progress Notes (Signed)
Electrophysiology TeleHealth Note   Due to national recommendations of social distancing due to COVID 19, an audio/video telehealth visit is felt to be most appropriate for this patient at this time.  See MyChart message from today for the patient's consent to telehealth for [facility].   Date:  08/10/2019   ID:  Ricky Hayden, DOB 01-16-1946, MRN [id number]  Location: patient's home  Provider location: [address] [place]  Evaluation Performed: Initial Evaluation  PCP:  Patient, No Pcp Per  Cardiologist:  No primary care provider on file.   [facility]rophysi[facility]  [facility]  [facility]  History of Present Illness:    Ricky Hayden is a 74 y.o. male who presents via audio/video conferencing for a telehealth visit today for  Consideration of CRT     He is referred from the [place].  Cardiac consultation note 08/03/2019 describes a low ejection fraction heart failure on and off since 2016.  Denies coronary disease or bypass surgery.  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  Date Cr K Hgb  9//20 (scanned)   1.37 4.2 14.5          DATE TEST EF   10/17 Echo  30-35%   10/20 Echo   30-35 %         Dyspnea @ 20 feet; accompanied by chest discomfort, relieved by rest after minutes. Reproducible.  No cath edema persisting despite lasix, now x 1 yrs Sleep in hospital bed, 3 pillow orthopnea--PND+>> relieved by sitting up for hours.   No syncope, some presyncope--assoc dyspnea and exertion.  No clear assoc between edema and dyspnea; salt intake reduced .    Tachypalps not COPD followed by Dr Jeralene Peters.   His wife answers most of the questions   The patient denies symptoms of fevers, chills, cough, or new SOB worrisome for COVID 19.    Past Medical History:  Diagnosis Date  . Arthritis    osteoarthritis of left knee  .  Balanitis    recurrent  . Cardiac arrhythmia    life threatening, secondary to CCB vs b- blockers  . Chronic joint pain   . COPD (chronic obstructive pulmonary disease) ([place])   . Coronary artery disease   . Enlarged prostate   . 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 GERD (gastroesophageal reflux disease)   . Gout   . Hiatal hernia   . Hyperlipidemia   . Hypertension   . Intracranial hematoma ([place]) 1995   history of, s/p evacuation by Dr. Sherwood Gambler  . Nocturia   . PONV (postoperative nausea and vomiting)   . Sinusitis    s/p ethmoidectomy and nasal septoplasty  . Vertigo    intermitantly    Past Surgical History:  Procedure Laterality Date  . CIRCUMCISION N/A 11/20/2013   Procedure: CIRCUMCISION ADULT;  Surgeon: Claybon Jabs, MD;  Location: [facility];  Service: Urology;  Laterality: N/A;  . [place]   hematomy due to sinus infection   . shoulder surg rt   1995  . SINUS SURGERY WITH INSTATRAK     ethmoidectomy and nasal septum repair    Current Outpatient Medications  Medication Sig Dispense Refill  . albuterol (PROVENTIL HFA;VENTOLIN HFA) 108 (90 BASE) MCG/ACT inhaler Inhale 2 puffs into the lungs every 6 (six) hours as needed for  wheezing. 1 Inhaler 11  . budesonide-formoterol (SYMBICORT) 80-4.5 MCG/ACT inhaler Inhale 2 puffs into the lungs 2 (two) times daily. 1 Inhaler 12  . carvedilol (COREG) 25 MG tablet TAKE ONE TABLET BY MOUTH TWICE DAILY WITH MEALS 180 tablet 2  . cloNIDine (CATAPRES) 0.3 MG tablet Take 1 tablet (0.3 mg total) by mouth 2 (two) times daily. 180 tablet 3  . docusate sodium (COLACE) 100 MG capsule Take 1-2 capsules (100-200 mg total) by mouth 2 (two) times daily as needed for mild constipation. 180 capsule 3  . finasteride (PROSCAR) 5 MG tablet TAKE ONE TABLET BY MOUTH ONCE DAILY 30 tablet 11  . furosemide (LASIX) 20 MG tablet TAKE ONE TABLET BY MOUTH ONCE DAILY (Patient taking differently: Take 40 mg by mouth daily. ) 30  tablet 2  . Multiple Vitamins-Minerals (ICAPS AREDS 2 PO) Take 2 capsules by mouth daily.    Marland Kitchen omeprazole (PRILOSEC) 20 MG capsule Take 1 capsule (20 mg total) by mouth daily. 30 capsule 11  . PHENobarbital (LUMINAL) 64.8 MG tablet Take 1 tablet (64.8 mg total) by mouth 2 (two) times daily. 180 tablet 3  . polyethylene glycol (MIRALAX / GLYCOLAX) packet Take 17 g by mouth daily as needed for moderate constipation. 14 each 0  . pravastatin (PRAVACHOL) 40 MG tablet Take 1 tablet (40 mg total) by mouth every evening. 90 tablet 3  . SPIRIVA HANDIHALER 18 MCG inhalation capsule INHALE ONE DOSE BY MOUTH ONCE DAILY (Patient taking differently: Place 18 mcg into inhaler and inhale daily. ) 30 capsule 11  . tamsulosin (FLOMAX) 0.4 MG CAPS capsule Take 1 capsule (0.4 mg total) by mouth daily. 90 capsule 3  . allopurinol (ZYLOPRIM) 100 MG tablet Take 2 tablets (200 mg total) by mouth daily. 60 tablet 11   No current facility-administered medications for this visit.    Allergies:   Calcium channel blockers   Social History:  The patient  reports that he quit smoking about 33 years ago. He has never used smokeless tobacco. He reports that he does not drink alcohol or use drugs.   Family History:  The patient's   family history is not on file. neg for heart disease   ROS:  Please see the history of present illness.   All other systems are personally reviewed and negative.    Exam:    Vital Signs:  Ht 5\' 8"  (1.727 m)   Wt 251 lb (113.9 kg)   BMI 38.16 kg/m        Labs/Other Tests and Data Reviewed:    Recent Labs: 10/21/2018: ALT 22; Hemoglobin 11.7; Magnesium 1.9; Platelets 113 10/23/2018: BUN 59; Creatinine, Ser 1.88; Potassium 4.7; Sodium 140   Wt Readings from Last 3 Encounters:  08/10/19 251 lb (113.9 kg)  10/22/18 239 lb 12.8 oz (108.8 kg)  06/29/18 248 lb (112.5 kg)     Other studies personally reviewed: Additional studies/ records that were reviewed today include: As above  Review  of the above records today demonstrates:As above     ASSESSMENT & PLAN:    Cardiomyopathy  CHF chronic systolic   DOE/Exertional Chest pain  COPD  LBBB  PVCs  Hyperkalemia June 2020   Chronic renal Insufficiency  Acute decompensation 6/20   The pt is referred for consideration of CRT  This may be appropriate; however, it seems there are diagnostic and treatment options available which might have greater import 1) does he have CAD given exertional chest pain 2) sounds like he Is  volume overloaded with PND, orthopnea, DOE and edema-- benefit from more aggressive diuretics 3)  Had been on ACE for years and aldactone for a few years when he became hyperkalemic  Would recommend careful reexposure, prob to entresto given its potential benefits 4) change his clonidine to something better suited for his cardiomyoapthy ?? BIDIL 5)  His PVC on the most recent tracing were frequent but on all previous tracings they were not evident Will reach out to heart failure clinic for assistance in the logistics of working through the above  Family agreeable      COVID 19 screen The patient denies symptoms of COVID 19 at this time.  The importance of social distancing was discussed today.  Follow-up:    Next remote:    Current medicines are reviewed at length with the patient today.   The patient does not have concerns regarding his medicines.  The following changes were made today:  Increase lasix to 80 mg daily x 3 days  Labs/ tests ordered today include:  ZIO patch for 3 days PVC No orders of the defined types were placed in this encounter.   Future tests ( post COVID )     Patient Risk:  after full review of this patients clinical status, I feel that they are at moderate risk at this time.  Today, I have spent 35 * minutes with the patient with telehealth technology discussing As above  Also reviewed case with DR DB who will see in a few weeks  .    Signed, Virl Axe, MD    08/10/2019 4:13 PM     [place] [address] [phone] (office) [phone] (fax)

## 2019-08-10 NOTE — Patient Instructions (Signed)
Medication Instructions:   **  Increase Furosemide to 80mg  daily (4 -  20mg  tablets daily in the morning)  by mouth x 3 days.  *If you need a refill on your cardiac medications before your next appointment, please call your pharmacy*   Lab Work: None ordered.  If you have labs (blood work) drawn today and your tests are completely normal, you will receive your results only by: Marland Kitchen MyChart Message (if you have MyChart) OR . A paper copy in the mail If you have any lab test that is abnormal or we need to change your treatment, we will call you to review the results.   Testing/Procedures:  Ricky Hayden- Long Term Monitor Instructions   Your physician has requested you wear your ZIO patch monitor____3___days.   This is a single patch monitor.  [employer] supplies one patch monitor per enrollment.  Additional stickers are not available.   Please do not apply patch if you will be having a Nuclear Stress Test, Echocardiogram, Cardiac CT, MRI, or Chest Xray during the time frame you would be wearing the monitor. The patch cannot be worn during these tests.  You cannot remove and re-apply the ZIO XT patch monitor.   Your ZIO patch monitor will be sent USPS Priority mail from [facility] directly to your home address. The monitor may also be mailed to a PO BOX if home delivery is not available.   It may take 3-5 days to receive your monitor after you have been enrolled.   Once you have received you monitor, please review enclosed instructions.  Your monitor has already been registered assigning a specific monitor serial # to you.   Applying the monitor   Shave hair from upper left chest.   Hold abrader disc by orange tab.  Rub abrader in 40 strokes over left upper chest as indicated in your monitor instructions.   Clean area with 4 enclosed alcohol pads .  Use all pads to assure are is cleaned thoroughly.  Let dry.   Apply patch as indicated in monitor instructions.  Patch will be place  under collarbone on left side of chest with arrow pointing upward.   Rub patch adhesive wings for 2 minutes.Remove white label marked "1".  Remove white label marked "2".  Rub patch adhesive wings for 2 additional minutes.   While looking in a mirror, press and release button in center of patch.  A small green light will flash 3-4 times .  This will be your only indicator the monitor has been turned on.     Do not shower for the first 24 hours.  You may shower after the first 24 hours.   Press button if you feel a symptom. You will hear a small click.  Record Date, Time and Symptom in the Patient Log Book.   When you are ready to remove patch, follow instructions on last 2 pages of Patient Log Book.  Stick patch monitor onto last page of Patient Log Book.   Place Patient Log Book in [place] box.  Use locking tab on box and tape box closed securely.  The [employer] has [employer] on it.  Please place in mailbox as soon as possible.  Your physician should have your test results approximately 7 days after the monitor has been mailed back to [facility].   Call [facility] at [phone] if you have questions regarding your ZIO XT patch monitor.  Call them immediately if you  see an orange light blinking on your monitor.   If your monitor falls off in less than 4 days contact our Monitor department at [phone].  If your monitor becomes loose or falls off after 4 days call [employer] at [phone] for suggestions on securing your monitor.     Follow-Up: At [facility], you and your health needs are our priority.  As part of our continuing mission to provide you with exceptional heart care, we have created designated Provider Care Teams.  These Care Teams include your primary Cardiologist (physician) and Advanced Practice Providers (APPs -  Physician Assistants and Nurse Practitioners) who all work together to provide you with the care you need, when you need  it.  We recommend signing up for the patient portal called "MyChart".  Sign up information is provided on this After Visit Summary.  MyChart is used to connect with patients for Virtual Visits (Telemedicine).  Patients are able to view lab/test results, encounter notes, upcoming appointments, etc.  Non-urgent messages can be sent to your provider as well.   To learn more about what you can do with MyChart, go to [email or web address].    Your next appointment:   You have been referred to see Dr Hayden Pedro with our [facility].  You will be contacted with appointment.  Follow up with Dr Caryl Comes to be determined.

## 2019-08-11 ENCOUNTER — Encounter: Payer: Self-pay | Admitting: *Deleted

## 2019-08-11 NOTE — Progress Notes (Signed)
Patient ID: Ricky Hayden, male   DOB: 31-Dec-1945, 74 y.o.   MRN: [id number] Patient enrolled for 3 day ZIO XT long term holter monitor to be sent by [employer] to his home.

## 2019-08-12 ENCOUNTER — Emergency Department (HOSPITAL_COMMUNITY): Payer: No Typology Code available for payment source

## 2019-08-12 ENCOUNTER — Inpatient Hospital Stay (HOSPITAL_COMMUNITY)
Admission: EM | Admit: 2019-08-12 | Discharge: 2019-08-19 | DRG: 177 | Disposition: A | Discharge status: Home Health | Payer: No Typology Code available for payment source | Source: Ambulatory Visit | Attending: Internal Medicine | Admitting: Internal Medicine

## 2019-08-12 ENCOUNTER — Ambulatory Visit
Admission: EM | Admit: 2019-08-12 | Discharge: 2019-08-12 | Disposition: A | Discharge status: Home / Self Care | Payer: No Typology Code available for payment source | Attending: Emergency Medicine | Admitting: Emergency Medicine

## 2019-08-12 ENCOUNTER — Encounter: Payer: Self-pay | Admitting: Emergency Medicine

## 2019-08-12 ENCOUNTER — Other Ambulatory Visit: Payer: Self-pay

## 2019-08-12 DIAGNOSIS — M109 Gout, unspecified: Secondary | ICD-10-CM | POA: Diagnosis present

## 2019-08-12 DIAGNOSIS — R0902 Hypoxemia: Secondary | ICD-10-CM

## 2019-08-12 DIAGNOSIS — J441 Chronic obstructive pulmonary disease with (acute) exacerbation: Secondary | ICD-10-CM | POA: Diagnosis not present

## 2019-08-12 DIAGNOSIS — E785 Hyperlipidemia, unspecified: Secondary | ICD-10-CM | POA: Diagnosis present

## 2019-08-12 DIAGNOSIS — J9601 Acute respiratory failure with hypoxia: Secondary | ICD-10-CM | POA: Diagnosis present

## 2019-08-12 DIAGNOSIS — I700 Atherosclerosis of aorta: Secondary | ICD-10-CM | POA: Diagnosis present

## 2019-08-12 DIAGNOSIS — G40909 Epilepsy, unspecified, not intractable, without status epilepticus: Secondary | ICD-10-CM | POA: Diagnosis present

## 2019-08-12 DIAGNOSIS — J1282 Pneumonia due to coronavirus disease 2019: Secondary | ICD-10-CM | POA: Diagnosis present

## 2019-08-12 DIAGNOSIS — Z87820 Personal history of traumatic brain injury: Secondary | ICD-10-CM

## 2019-08-12 DIAGNOSIS — K219 Gastro-esophageal reflux disease without esophagitis: Secondary | ICD-10-CM | POA: Diagnosis present

## 2019-08-12 DIAGNOSIS — I2510 Atherosclerotic heart disease of native coronary artery without angina pectoris: Secondary | ICD-10-CM | POA: Diagnosis present

## 2019-08-12 DIAGNOSIS — R0602 Shortness of breath: Secondary | ICD-10-CM

## 2019-08-12 DIAGNOSIS — Z8661 Personal history of infections of the central nervous system: Secondary | ICD-10-CM

## 2019-08-12 DIAGNOSIS — I4581 Long QT syndrome: Secondary | ICD-10-CM | POA: Diagnosis present

## 2019-08-12 DIAGNOSIS — U071 COVID-19: Secondary | ICD-10-CM

## 2019-08-12 DIAGNOSIS — K5900 Constipation, unspecified: Secondary | ICD-10-CM | POA: Diagnosis present

## 2019-08-12 DIAGNOSIS — N1832 Chronic kidney disease, stage 3b: Secondary | ICD-10-CM | POA: Diagnosis not present

## 2019-08-12 DIAGNOSIS — I130 Hypertensive heart and chronic kidney disease with heart failure and stage 1 through stage 4 chronic kidney disease, or unspecified chronic kidney disease: Secondary | ICD-10-CM | POA: Diagnosis present

## 2019-08-12 DIAGNOSIS — Z8249 Family history of ischemic heart disease and other diseases of the circulatory system: Secondary | ICD-10-CM

## 2019-08-12 DIAGNOSIS — I5043 Acute on chronic combined systolic (congestive) and diastolic (congestive) heart failure: Secondary | ICD-10-CM | POA: Diagnosis present

## 2019-08-12 DIAGNOSIS — K449 Diaphragmatic hernia without obstruction or gangrene: Secondary | ICD-10-CM | POA: Diagnosis present

## 2019-08-12 DIAGNOSIS — S069XAA Unspecified intracranial injury with loss of consciousness status unknown, initial encounter: Secondary | ICD-10-CM | POA: Diagnosis present

## 2019-08-12 DIAGNOSIS — I10 Essential (primary) hypertension: Secondary | ICD-10-CM | POA: Diagnosis present

## 2019-08-12 DIAGNOSIS — N183 Chronic kidney disease, stage 3 unspecified: Secondary | ICD-10-CM | POA: Diagnosis present

## 2019-08-12 DIAGNOSIS — I447 Left bundle-branch block, unspecified: Secondary | ICD-10-CM | POA: Diagnosis present

## 2019-08-12 DIAGNOSIS — R001 Bradycardia, unspecified: Secondary | ICD-10-CM | POA: Diagnosis not present

## 2019-08-12 DIAGNOSIS — Z7951 Long term (current) use of inhaled steroids: Secondary | ICD-10-CM

## 2019-08-12 DIAGNOSIS — I351 Nonrheumatic aortic (valve) insufficiency: Secondary | ICD-10-CM | POA: Diagnosis not present

## 2019-08-12 DIAGNOSIS — J440 Chronic obstructive pulmonary disease with acute lower respiratory infection: Secondary | ICD-10-CM | POA: Diagnosis present

## 2019-08-12 DIAGNOSIS — I959 Hypotension, unspecified: Secondary | ICD-10-CM | POA: Diagnosis present

## 2019-08-12 DIAGNOSIS — I5082 Biventricular heart failure: Secondary | ICD-10-CM | POA: Diagnosis present

## 2019-08-12 DIAGNOSIS — I340 Nonrheumatic mitral (valve) insufficiency: Secondary | ICD-10-CM | POA: Diagnosis not present

## 2019-08-12 DIAGNOSIS — D696 Thrombocytopenia, unspecified: Secondary | ICD-10-CM | POA: Diagnosis present

## 2019-08-12 DIAGNOSIS — I5042 Chronic combined systolic (congestive) and diastolic (congestive) heart failure: Secondary | ICD-10-CM | POA: Diagnosis not present

## 2019-08-12 DIAGNOSIS — I429 Cardiomyopathy, unspecified: Secondary | ICD-10-CM | POA: Diagnosis present

## 2019-08-12 DIAGNOSIS — N179 Acute kidney failure, unspecified: Secondary | ICD-10-CM | POA: Diagnosis present

## 2019-08-12 DIAGNOSIS — J9621 Acute and chronic respiratory failure with hypoxia: Secondary | ICD-10-CM | POA: Diagnosis present

## 2019-08-12 DIAGNOSIS — E669 Obesity, unspecified: Secondary | ICD-10-CM | POA: Diagnosis present

## 2019-08-12 DIAGNOSIS — Z6836 Body mass index (BMI) 36.0-36.9, adult: Secondary | ICD-10-CM

## 2019-08-12 DIAGNOSIS — Z87891 Personal history of nicotine dependence: Secondary | ICD-10-CM

## 2019-08-12 DIAGNOSIS — N400 Enlarged prostate without lower urinary tract symptoms: Secondary | ICD-10-CM | POA: Diagnosis present

## 2019-08-12 DIAGNOSIS — Z888 Allergy status to other drugs, medicaments and biological substances status: Secondary | ICD-10-CM

## 2019-08-12 DIAGNOSIS — M7989 Other specified soft tissue disorders: Secondary | ICD-10-CM | POA: Diagnosis present

## 2019-08-12 DIAGNOSIS — S069X9A Unspecified intracranial injury with loss of consciousness of unspecified duration, initial encounter: Secondary | ICD-10-CM | POA: Diagnosis present

## 2019-08-12 DIAGNOSIS — I5023 Acute on chronic systolic (congestive) heart failure: Secondary | ICD-10-CM | POA: Diagnosis not present

## 2019-08-12 DIAGNOSIS — I5021 Acute systolic (congestive) heart failure: Secondary | ICD-10-CM | POA: Diagnosis not present

## 2019-08-12 DIAGNOSIS — Z79899 Other long term (current) drug therapy: Secondary | ICD-10-CM

## 2019-08-12 HISTORY — DX: Cardiomyopathy, unspecified: I42.9

## 2019-08-12 HISTORY — DX: Chronic systolic (congestive) heart failure: I50.22

## 2019-08-12 HISTORY — DX: Obesity, unspecified: E66.9

## 2019-08-12 HISTORY — DX: Chronic kidney disease, stage 3 unspecified: N18.30

## 2019-08-12 HISTORY — DX: Chronic pansinusitis: J32.4

## 2019-08-12 HISTORY — DX: Thoracic aortic ectasia: I77.810

## 2019-08-12 LAB — CBC WITH DIFFERENTIAL/PLATELET
Abs Immature Granulocytes: 0.03 10*3/uL (ref 0.00–0.07)
Basophils Absolute: 0 10*3/uL (ref 0.0–0.1)
Basophils Relative: 0 %
Eosinophils Absolute: 0 10*3/uL (ref 0.0–0.5)
Eosinophils Relative: 0 %
HCT: 48.3 % (ref 39.0–52.0)
Hemoglobin: 15.7 g/dL (ref 13.0–17.0)
Immature Granulocytes: 1 %
Lymphocytes Relative: 27 %
Lymphs Abs: 1.2 10*3/uL (ref 0.7–4.0)
MCH: 30.8 pg (ref 26.0–34.0)
MCHC: 32.5 g/dL (ref 30.0–36.0)
MCV: 94.7 fL (ref 80.0–100.0)
Monocytes Absolute: 0.9 10*3/uL (ref 0.1–1.0)
Monocytes Relative: 20 %
Neutro Abs: 2.4 10*3/uL (ref 1.7–7.7)
Neutrophils Relative %: 52 %
Platelets: 111 10*3/uL — ABNORMAL LOW (ref 150–400)
RBC: 5.1 MIL/uL (ref 4.22–5.81)
RDW: 14.2 % (ref 11.5–15.5)
WBC: 4.6 10*3/uL (ref 4.0–10.5)
nRBC: 0.7 % — ABNORMAL HIGH (ref 0.0–0.2)

## 2019-08-12 LAB — GLUCOSE, CAPILLARY: Glucose-Capillary: 140 mg/dL — ABNORMAL HIGH (ref 70–99)

## 2019-08-12 LAB — COMPREHENSIVE METABOLIC PANEL
ALT: 35 U/L (ref 0–44)
AST: 35 U/L (ref 15–41)
Albumin: 3.1 g/dL — ABNORMAL LOW (ref 3.5–5.0)
Alkaline Phosphatase: 62 U/L (ref 38–126)
Anion gap: 11 (ref 5–15)
BUN: 40 mg/dL — ABNORMAL HIGH (ref 8–23)
CO2: 32 mmol/L (ref 22–32)
Calcium: 8.6 mg/dL — ABNORMAL LOW (ref 8.9–10.3)
Chloride: 98 mmol/L (ref 98–111)
Creatinine, Ser: 1.64 mg/dL — ABNORMAL HIGH (ref 0.61–1.24)
GFR calc Af Amer: 47 mL/min — ABNORMAL LOW (ref >60)
GFR calc non Af Amer: 40 mL/min — ABNORMAL LOW (ref >60)
Glucose, Bld: 126 mg/dL — ABNORMAL HIGH (ref 70–99)
Potassium: 3.6 mmol/L (ref 3.5–5.1)
Sodium: 141 mmol/L (ref 135–145)
Total Bilirubin: 0.7 mg/dL (ref 0.3–1.2)
Total Protein: 6.1 g/dL — ABNORMAL LOW (ref 6.5–8.1)

## 2019-08-12 LAB — CBG MONITORING, ED: Glucose-Capillary: 95 mg/dL (ref 70–99)

## 2019-08-12 LAB — LACTIC ACID, PLASMA
Lactic Acid, Venous: 1.4 mmol/L (ref 0.5–1.9)
Lactic Acid, Venous: 1.1 mmol/L (ref 0.5–1.9)

## 2019-08-12 LAB — PROCALCITONIN: Procalcitonin: 0.15 ng/mL

## 2019-08-12 LAB — FIBRINOGEN: Fibrinogen: 350 mg/dL (ref 210–475)

## 2019-08-12 LAB — C-REACTIVE PROTEIN: CRP: 1.3 mg/dL — ABNORMAL HIGH (ref <1.0)

## 2019-08-12 LAB — LACTATE DEHYDROGENASE: LDH: 209 U/L — ABNORMAL HIGH (ref 98–192)

## 2019-08-12 LAB — D-DIMER, QUANTITATIVE: D-Dimer, Quant: 0.55 ug/mL-FEU — ABNORMAL HIGH (ref 0.00–0.50)

## 2019-08-12 LAB — TRIGLYCERIDES: Triglycerides: 186 mg/dL — ABNORMAL HIGH (ref <150)

## 2019-08-12 LAB — FERRITIN: Ferritin: 99 ng/mL (ref 24–336)

## 2019-08-12 LAB — POC SARS CORONAVIRUS 2 AG -  ED: SARS Coronavirus 2 Ag: POSITIVE — AB

## 2019-08-12 MED ORDER — INSULIN ASPART 100 UNIT/ML ~~LOC~~ SOLN: 0.0000 [IU] | Freq: Every day | SUBCUTANEOUS | Status: DC

## 2019-08-12 MED ORDER — MOMETASONE FURO-FORMOTEROL FUM 100-5 MCG/ACT IN AERO
2.0000 | INHALATION_SPRAY | Freq: Two times a day (BID) | RESPIRATORY_TRACT | Status: DC
  Administered 2019-08-13 – 2019-08-19 (×13): 2 via RESPIRATORY_TRACT
  Filled 2019-08-12 (×2): qty 8.8

## 2019-08-12 MED ORDER — CARVEDILOL 25 MG PO TABS
25.0000 mg | ORAL_TABLET | Freq: Two times a day (BID) | ORAL | Status: DC
  Filled 2019-08-12: qty 2

## 2019-08-12 MED ORDER — GUAIFENESIN-DM 100-10 MG/5ML PO SYRP: 10.0000 mL | ORAL_SOLUTION | ORAL | Status: DC | PRN

## 2019-08-12 MED ORDER — SODIUM CHLORIDE 0.9 % IV SOLN
200.0000 mg | Freq: Once | INTRAVENOUS | Status: AC
  Administered 2019-08-12: 200 mg via INTRAVENOUS
  Filled 2019-08-12: qty 40

## 2019-08-12 MED ORDER — UMECLIDINIUM BROMIDE 62.5 MCG/INH IN AEPB
18.0000 | INHALATION_SPRAY | Freq: Every day | RESPIRATORY_TRACT | Status: DC
  Administered 2019-08-13 – 2019-08-18 (×6): 18 via RESPIRATORY_TRACT
  Filled 2019-08-12 (×2): qty 7
  Filled 2019-08-12: qty 21
  Filled 2019-08-12: qty 7

## 2019-08-12 MED ORDER — SALINE SPRAY 0.65 % NA SOLN
1.0000 | NASAL | Status: DC
  Filled 2019-08-12: qty 44

## 2019-08-12 MED ORDER — PANTOPRAZOLE SODIUM 40 MG PO TBEC
40.0000 mg | DELAYED_RELEASE_TABLET | Freq: Every day | ORAL | Status: DC
  Administered 2019-08-12 – 2019-08-19 (×8): 40 mg via ORAL
  Filled 2019-08-12 (×8): qty 1

## 2019-08-12 MED ORDER — PHENOBARBITAL 32.4 MG PO TABS
64.8000 mg | ORAL_TABLET | Freq: Two times a day (BID) | ORAL | Status: DC
  Administered 2019-08-12 – 2019-08-19 (×14): 64.8 mg via ORAL
  Filled 2019-08-12 (×15): qty 2

## 2019-08-12 MED ORDER — HEPARIN SODIUM (PORCINE) 5000 UNIT/ML IJ SOLN
5000.0000 [IU] | Freq: Three times a day (TID) | INTRAMUSCULAR | Status: DC
  Administered 2019-08-12 – 2019-08-19 (×20): 5000 [IU] via SUBCUTANEOUS
  Filled 2019-08-12 (×20): qty 1

## 2019-08-12 MED ORDER — FINASTERIDE 5 MG PO TABS
5.0000 mg | ORAL_TABLET | Freq: Every day | ORAL | Status: DC
  Administered 2019-08-13 – 2019-08-19 (×7): 5 mg via ORAL
  Filled 2019-08-12 (×7): qty 1

## 2019-08-12 MED ORDER — SODIUM CHLORIDE 0.9 % IV SOLN
100.0000 mg | Freq: Every day | INTRAVENOUS | Status: AC
  Administered 2019-08-13 – 2019-08-16 (×4): 100 mg via INTRAVENOUS
  Filled 2019-08-12 (×5): qty 20

## 2019-08-12 MED ORDER — ONDANSETRON HCL 4 MG/2ML IJ SOLN: 4.0000 mg | Freq: Four times a day (QID) | INTRAMUSCULAR | Status: DC | PRN

## 2019-08-12 MED ORDER — TAMSULOSIN HCL 0.4 MG PO CAPS
0.4000 mg | ORAL_CAPSULE | Freq: Every day | ORAL | Status: DC
  Administered 2019-08-13 – 2019-08-19 (×7): 0.4 mg via ORAL
  Filled 2019-08-12 (×7): qty 1

## 2019-08-12 MED ORDER — DEXAMETHASONE SODIUM PHOSPHATE 10 MG/ML IJ SOLN
6.0000 mg | INTRAMUSCULAR | Status: DC
  Administered 2019-08-12: 18:00:00 6 mg via INTRAVENOUS
  Filled 2019-08-12: qty 1

## 2019-08-12 MED ORDER — ZINC SULFATE 220 (50 ZN) MG PO CAPS
220.0000 mg | ORAL_CAPSULE | Freq: Every day | ORAL | Status: DC
  Administered 2019-08-12 – 2019-08-19 (×8): 220 mg via ORAL
  Filled 2019-08-12 (×8): qty 1

## 2019-08-12 MED ORDER — ONDANSETRON HCL 4 MG PO TABS: 4.0000 mg | ORAL_TABLET | Freq: Four times a day (QID) | ORAL | Status: DC | PRN

## 2019-08-12 MED ORDER — IPRATROPIUM-ALBUTEROL 20-100 MCG/ACT IN AERS
1.0000 | INHALATION_SPRAY | Freq: Four times a day (QID) | RESPIRATORY_TRACT | Status: DC
  Administered 2019-08-12 – 2019-08-13 (×4): 1 via RESPIRATORY_TRACT
  Filled 2019-08-12 (×2): qty 4

## 2019-08-12 MED ORDER — ACETAMINOPHEN 325 MG PO TABS
650.0000 mg | ORAL_TABLET | Freq: Four times a day (QID) | ORAL | Status: DC | PRN
  Filled 2019-08-12: qty 2

## 2019-08-12 MED ORDER — ASCORBIC ACID 500 MG PO TABS
500.0000 mg | ORAL_TABLET | Freq: Every day | ORAL | Status: DC
  Administered 2019-08-12 – 2019-08-19 (×8): 500 mg via ORAL
  Filled 2019-08-12 (×8): qty 1

## 2019-08-12 MED ORDER — PRAVASTATIN SODIUM 40 MG PO TABS
40.0000 mg | ORAL_TABLET | Freq: Every evening | ORAL | Status: DC
  Administered 2019-08-13 – 2019-08-15 (×3): 40 mg via ORAL
  Filled 2019-08-12 (×4): qty 1

## 2019-08-12 MED ORDER — LINAGLIPTIN 5 MG PO TABS
5.0000 mg | ORAL_TABLET | Freq: Every day | ORAL | Status: DC
  Filled 2019-08-12: qty 1

## 2019-08-12 MED ORDER — INSULIN ASPART 100 UNIT/ML ~~LOC~~ SOLN
0.0000 [IU] | Freq: Three times a day (TID) | SUBCUTANEOUS | Status: DC
  Administered 2019-08-13 (×2): 1 [IU] via SUBCUTANEOUS
  Administered 2019-08-13 – 2019-08-15 (×5): 2 [IU] via SUBCUTANEOUS
  Administered 2019-08-15: 3 [IU] via SUBCUTANEOUS
  Administered 2019-08-15 – 2019-08-16 (×2): 1 [IU] via SUBCUTANEOUS
  Administered 2019-08-16 (×2): 2 [IU] via SUBCUTANEOUS
  Administered 2019-08-17: 12:00:00 3 [IU] via SUBCUTANEOUS
  Administered 2019-08-17: 1 [IU] via SUBCUTANEOUS
  Administered 2019-08-18: 2 [IU] via SUBCUTANEOUS
  Administered 2019-08-18 – 2019-08-19 (×3): 1 [IU] via SUBCUTANEOUS

## 2019-08-12 NOTE — Progress Notes (Signed)
Ricky Hayden [id number] Admission Data: 08/12/2019 10:28 PM Attending Provider: Lavina Hamman, MD  GSU:[id number], No Pcp Per Consults/ Treatment Team:   Cristopher Ciccarelli is a 74 y.o. male patient admitted from ED awake, alert  & orientated  X 3,  Full Code, VSS - Blood pressure 110/82, pulse 79, temperature 98.3 F (36.8 C), temperature source Oral, resp. rate 14, height 5\' 8"  (1.727 m), weight 113.9 kg, SpO2 98 %., O2    4 L nasal cannular, no c/o shortness of breath, no c/o chest pain, no distress noted. Tele # [id number] placed and pt is currently running:normal sinus rhythm.   IV site WDL:  forearm right, condition patent and no redness with a transparent dsg that's clean dry and intact.  Allergies:   Allergies  Allergen Reactions  . Calcium Channel Blockers Other (See Comments)    Came to hospital in 1995-caused chest pain      Past Medical History:  Diagnosis Date  . Arthritis    osteoarthritis of left knee  . Balanitis    recurrent  . Cardiac arrhythmia    life threatening, secondary to CCB vs b- blockers  . Chronic joint pain   . COPD (chronic obstructive pulmonary disease) ([place])   . Coronary artery disease   . Enlarged prostate   . 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 GERD (gastroesophageal reflux disease)   . Gout   . Hiatal hernia   . Hyperlipidemia   . Hypertension   . Intracranial hematoma ([place]) 1995   history of, s/p evacuation by Dr. Sherwood Gambler  . Nocturia   . PONV (postoperative nausea and vomiting)   . Sinusitis    s/p ethmoidectomy and nasal septoplasty  . Vertigo    intermitantly   Pt orientation to unit, room and routine.  Admission INP armband ID verified with patient/family, and in place. SR up x 2, fall risk assessment complete with Patient and family verbalizing understanding of risks associated with falls. Pt verbalizes an understanding of how to use the call bell and to call for help before getting out of bed.  Skin, clean-dry-  intact without evidence of bruising, or skin tears.   No evidence of skin break down noted on exam. no rashes, no ecchymoses, no wounds  Will cont to monitor and assist as needed.  Walker Shadow, RN 08/12/2019 10:28 PM

## 2019-08-12 NOTE — ED Notes (Signed)
Spoke with pharmacy regarding medications, stated they are verifying medications after they verify med list with family. Instructed to hold at this time.

## 2019-08-12 NOTE — ED Notes (Signed)
Pt oxygen at 86% on RA. Duque placed on pt at 3L, now at 99%.

## 2019-08-12 NOTE — ED Provider Notes (Signed)
[facility] EMERGENCY DEPARTMENT Provider Note  CSN: [id number] Arrival date & time: 08/12/19 1512    History Chief Complaint  Patient presents with  . Shortness of Breath    HPI   Ricky Hayden is a 74 y.o. male with history of COPD, CHF who presents to the emergency department for evaluation of shortness of breath.  He reports his breathing has been getting worse over the last several days.  He has had increasing cough but no fever.  His wife was recently diagnosed with Covid and is admitted to the hospital here.  He had initially gone to local urgent care where his point-of-care Covid test was positive and his oxygen saturation was 88% on room air.  He does not have home oxygen.  He uses inhalers and nebulizers as needed.  His PO2 was improved with 4 L nasal cannula in route.   Past Medical History:  Diagnosis Date  . Arthritis    osteoarthritis of left knee  . Balanitis    recurrent  . Cardiac arrhythmia    life threatening, secondary to CCB vs b- blockers  . Chronic joint pain   . COPD (chronic obstructive pulmonary disease) ([place])   . Coronary artery disease   . Enlarged prostate   . 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 GERD (gastroesophageal reflux disease)   . Gout   . Hiatal hernia   . Hyperlipidemia   . Hypertension   . Intracranial hematoma ([place]) 1995   history of, s/p evacuation by Dr. Sherwood Gambler  . Nocturia   . PONV (postoperative nausea and vomiting)   . Sinusitis    s/p ethmoidectomy and nasal septoplasty  . Vertigo    intermitantly    Past Surgical History:  Procedure Laterality Date  . CIRCUMCISION N/A 11/20/2013   Procedure: CIRCUMCISION ADULT;  Surgeon: Claybon Jabs, MD;  Location: [facility] ORS;  Service: Urology;  Laterality: N/A;  . [place]   hematomy due to sinus infection   . shoulder surg rt   1995  . SINUS SURGERY WITH INSTATRAK     ethmoidectomy and nasal septum repair    No family history on file.  Social  History   Tobacco Use  . Smoking status: Former Smoker    Quit date: 05/04/1986    Years since quitting: 33.2  . Smokeless tobacco: Never Used  Substance Use Topics  . Alcohol use: No    Alcohol/week: 0.0 standard drinks  . Drug use: No     Home Medications Prior to Admission medications   Medication Sig Start Date End Date Taking? Authorizing Provider  albuterol (PROVENTIL HFA;VENTOLIN HFA) 108 (90 BASE) MCG/ACT inhaler Inhale 2 puffs into the lungs every 6 (six) hours as needed for wheezing. 12/10/14   Maryellen Pile, MD  allopurinol (ZYLOPRIM) 100 MG tablet Take 2 tablets (200 mg total) by mouth daily. 12/10/14 10/21/18  Maryellen Pile, MD  budesonide-formoterol (SYMBICORT) 80-4.5 MCG/ACT inhaler Inhale 2 puffs into the lungs 2 (two) times daily. 09/17/15   Corky Sox, MD  carvedilol (COREG) 25 MG tablet TAKE ONE TABLET BY MOUTH TWICE DAILY WITH MEALS 11/13/15   Maryellen Pile, MD  cloNIDine (CATAPRES) 0.3 MG tablet Take 1 tablet (0.3 mg total) by mouth 2 (two) times daily. 03/05/15   Maryellen Pile, MD  docusate sodium (COLACE) 100 MG capsule Take 1-2 capsules (100-200 mg total) by mouth 2 (two) times daily as needed for mild constipation. 03/05/14   [name]  L, MD  finasteride (PROSCAR) 5 MG tablet TAKE ONE TABLET BY MOUTH ONCE DAILY 03/05/15   Maryellen Pile, MD  furosemide (LASIX) 20 MG tablet TAKE ONE TABLET BY MOUTH ONCE DAILY Patient taking differently: Take 40 mg by mouth daily.  12/27/15   Maryellen Pile, MD  Multiple Vitamins-Minerals (ICAPS AREDS 2 PO) Take 2 capsules by mouth daily.    [provider]  omeprazole (PRILOSEC) 20 MG capsule Take 1 capsule (20 mg total) by mouth daily. 12/10/14   Maryellen Pile, MD  PHENobarbital (LUMINAL) 64.8 MG tablet Take 1 tablet (64.8 mg total) by mouth 2 (two) times daily. 07/24/15   Corky Sox, MD  polyethylene glycol [facility] Floria Raveling) packet Take 17 g by mouth daily as needed for moderate constipation. 12/10/14   Maryellen Pile, MD  pravastatin (PRAVACHOL) 40 MG tablet Take 1 tablet (40 mg total) by mouth every evening. 12/10/14   Maryellen Pile, MD  SPIRIVA HANDIHALER 18 MCG inhalation capsule INHALE ONE DOSE BY MOUTH ONCE DAILY Patient taking differently: Place 18 mcg into inhaler and inhale daily.  09/27/15   Maryellen Pile, MD  tamsulosin (FLOMAX) 0.4 MG CAPS capsule Take 1 capsule (0.4 mg total) by mouth daily. 03/05/15   Maryellen Pile, MD     Allergies    Calcium channel blockers   Review of Systems   Review of Systems  Constitutional: Negative for fever.  HENT: Negative for congestion and sore throat.   Respiratory: Positive for cough, shortness of breath and wheezing.   Cardiovascular: Negative for chest pain.  Gastrointestinal: Negative for abdominal pain, diarrhea, nausea and vomiting.  Genitourinary: Negative for dysuria.  Musculoskeletal: Negative for myalgias.  Skin: Negative for rash.  Neurological: Negative for headaches.  Psychiatric/Behavioral: Negative for behavioral problems.     Physical Exam BP 100/72   Pulse 78   Temp 98.9 F (37.2 C)   Resp (!) 24   Ht 5\' 8"  (1.727 m)   Wt 113.9 kg   SpO2 99%   BMI 38.16 kg/m   Physical Exam Constitutional:      Appearance: Normal appearance.  HENT:     Head: Normocephalic and atraumatic.     Nose: Nose normal.     Mouth/Throat:     Mouth: Mucous membranes are moist.  Eyes:     Extraocular Movements: Extraocular movements intact.     Conjunctiva/sclera: Conjunctivae normal.  Cardiovascular:     Rate and Rhythm: Normal rate.  Pulmonary:     Effort: Pulmonary effort is normal.     Breath sounds: Decreased breath sounds and wheezing present.  Abdominal:     General: Abdomen is flat.     Palpations: Abdomen is soft.     Tenderness: There is no abdominal tenderness.  Musculoskeletal:        General: No swelling. Normal range of motion.     Cervical back: Neck supple.     Comments: 2+ bilateral lower extremity edema, at  baseline per patient.  Skin:    General: Skin is warm and dry.  Neurological:     General: No focal deficit present.     Mental Status: He is alert.  Psychiatric:        Mood and Affect: Mood normal.      ED Results / Procedures / Treatments   Labs (all labs ordered are listed, but only abnormal results are displayed) Labs Reviewed  CBC WITH DIFFERENTIAL/PLATELET - Abnormal; Notable for the following components:  Result Value   Platelets 111 (*)    nRBC 0.7 (*)    All other components within normal limits  COMPREHENSIVE METABOLIC PANEL - Abnormal; Notable for the following components:   Glucose, Bld 126 (*)    BUN 40 (*)    Creatinine, Ser 1.64 (*)    Calcium 8.6 (*)    Total Protein 6.1 (*)    Albumin 3.1 (*)    GFR calc non Af Amer 40 (*)    GFR calc Af Amer 47 (*)    All other components within normal limits  D-DIMER, QUANTITATIVE (NOT AT [facility]) - Abnormal; Notable for the following components:   D-Dimer, Quant 0.55 (*)    All other components within normal limits  LACTATE DEHYDROGENASE - Abnormal; Notable for the following components:   LDH 209 (*)    All other components within normal limits  TRIGLYCERIDES - Abnormal; Notable for the following components:   Triglycerides 186 (*)    All other components within normal limits  CULTURE, BLOOD (ROUTINE X 2)  CULTURE, BLOOD (ROUTINE X 2)  LACTIC ACID, PLASMA  FIBRINOGEN  LACTIC ACID, PLASMA  PROCALCITONIN  FERRITIN  C-REACTIVE PROTEIN    EKG EKG Interpretation  Date/Time:  Saturday August 12 2019 15:13:10 EDT Ventricular Rate:  75 PR Interval:    QRS Duration: 171 QT Interval:  482 QTC Calculation: 539 R Axis:   -77 Text Interpretation: Sinus rhythm Prolonged PR interval Biatrial enlargement Left bundle branch block Since last tracing LBBB has replaced NSIVCD Confirmed by Karle Starch  MD, Juanda Crumble [phone]) on 08/12/2019 3:15:15 PM   Radiology DG Chest Port 1 View  Result Date: 08/12/2019 CLINICAL DATA:   Shortness of breath, COVID positive EXAM: PORTABLE CHEST 1 VIEW COMPARISON:  06/29/2018 FINDINGS: Mild left basilar opacity, likely atelectasis. Lungs otherwise clear in this patient with known COVID. No pleural effusion or pneumothorax. Cardiomegaly.  Mild thoracic aortic atherosclerosis. IMPRESSION: Mild left basilar opacity, likely atelectasis. Lungs otherwise clear in this patient with known COVID. Cardiomegaly.  Thoracic aortic atherosclerosis. Electronically Signed   By: Julian Hy M.D.   On: 08/12/2019 16:08    Procedures Procedures  Medications Ordered in the ED Medications - No data to display   ED Course  I have reviewed the triage vital signs and the nursing notes.  Pertinent labs & imaging results that were available during my care of the patient were reviewed by me and considered in my medical decision making (see chart for details).  Clinical Course as of Aug 12 1630  Sat Aug 12, 2019  1537 Patient with COPD, CHF and diagnosis of Covid today.  He was hypoxic at urgent care prior to arrival to the ED.  Improved with 4 L nasal cannula, titrated down to 2 L.  Labs and imaging are pending.   [CS]  8768 Labs reviewed, low normal WBC, CMP shows CKD but not significantly changed from baseline. LDH, dimer mildly elevated. Will discuss admission with hospitalist.    [CS]  1624 Patient has not been vaccinated for Covid.    [CS]  1157 Spoke with the Hospitalist who will admit the patient.    [CS]    Clinical Course User Index [CS] Truddie Hidden, MD    MDM Rules/Calculators/A&P MDM Number of Diagnoses or Management Options Diagnosis management comments: Patient with history of COPD and CHF, found to be Covid positive at urgent care where he was also hypoxic.  Sent to the emerge department for evaluation.  His blood  pressure is borderline low but improving.  He declines albuterol at this time.  We will check his labs, chest x-ray and anticipate admission to the  hospital.    Amount and/or Complexity of Data Reviewed Clinical lab tests: ordered and reviewed Tests in the radiology section of CPT: ordered and reviewed Review and summarize past medical records: yes Discuss the patient with other providers: yes Independent visualization of images, tracings, or specimens: yes  Risk of Complications, Morbidity, and/or Mortality Presenting problems: high Diagnostic procedures: high Management options: high    Final Clinical Impression(s) / ED Diagnoses Final diagnoses:  COVID-19  Acute on chronic respiratory failure with hypoxia [facility])    Rx / DC Orders ED Discharge Orders    None       Truddie Hidden, MD 08/12/19 [phone]

## 2019-08-12 NOTE — ED Notes (Signed)
[facility] arrived / brittany, pa gave reports to  EMS

## 2019-08-12 NOTE — ED Triage Notes (Signed)
Patient's wife is in covid unit at [facility]. Wife was sick for 3-4 days prior to going by ambulance to hospital last night   Patient's baseline breathing is rapid/shallow-has copd.   patient feels week.

## 2019-08-12 NOTE — H&P (Signed)
[facility] History and Physical   Patient: Ricky Hayden HEN:[id number]   PCP: Patient, No Pcp Per DOB: [birth year]   DOA: 08/12/2019   DOS: 08/12/2019   DOS: the patient was seen and examined on 08/12/2019  Patient coming from: The patient is coming from Home  Chief Complaint: Feeling fatigue/hypotension/ Covid positive  HPI: Ricky Hayden is a 74 y.o. male with Past medical history of chronic combined CHF, CAD, BPH, GERD, gout, hiatal hernia, HTN, COPD, CKD 3b. Patient presented with complaints and was diagnosed with fatigue and tiredness. Patient appears to be poor historian.  He tells me that his wife is admitted to the hospital with COVID-19 illness and for last couple of days he was also not feeling well before he went to the urgent care where he was found to have low blood pressure and he was informed that he has to be admitted in the hospital.  On further work-up patient was also found positive for Covid with hypoxia and patient was referred to the hospital for that. At the time of my evaluation patient denies having any complaints of fever, cough, chills, abdominal pain, nausea, vomiting. He mentions he is constipated and his last bowel movement was 4 days ago. He is passing gas. He has minimal oral intake. He reports generalized body ache but no joint aches or joint pain. Denies any rash anywhere. He has not taken Covid vaccine because he is skeptical about the vaccine. He denies any headache or dizziness. He does not remember what medicines he takes. His primary care provider is a [place]. He tells me that he has COPD and ever since he has been "sick with it" his oxygenation runs in 2s. He also mentions that he has chronic swelling in his legs and he remains compliant with all his medications. He tells me that they changed one of his blood pressure medication from 1 tablet twice a day to 2 tablets twice a day although he does not know the name of it.  ED Course: Found to have  hypotension with blood pressure in 70s as well as saturation in 80s.  At his baseline ambulates with assistance independent for most of his ADL;  manages his medication on his own.  Review of Systems: as mentioned in the history of present illness.  All other systems reviewed and are negative.  Past Medical History:  Diagnosis Date  . Arthritis    osteoarthritis of left knee  . Balanitis    recurrent  . Cardiac arrhythmia    life threatening, secondary to CCB vs b- blockers  . Chronic joint pain   . COPD (chronic obstructive pulmonary disease) ([place])   . Coronary artery disease   . Enlarged prostate   . 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 GERD (gastroesophageal reflux disease)   . Gout   . Hiatal hernia   . Hyperlipidemia   . Hypertension   . Intracranial hematoma ([facility]) 1995   history of, s/p evacuation by Dr. Sherwood Gambler  . Nocturia   . PONV (postoperative nausea and vomiting)   . Sinusitis    s/p ethmoidectomy and nasal septoplasty  . Vertigo    intermitantly   Past Surgical History:  Procedure Laterality Date  . CIRCUMCISION N/A 11/20/2013   Procedure: CIRCUMCISION ADULT;  Surgeon: Claybon Jabs, MD;  Location: [facility];  Service: Urology;  Laterality: N/A;  . Luis Lopez   hematomy due to sinus infection   . shoulder surg rt  1995  . SINUS SURGERY WITH INSTATRAK     ethmoidectomy and nasal septum repair   Social History:  reports that he quit smoking about 33 years ago. He has never used smokeless tobacco. He reports that he does not drink alcohol or use drugs.  Allergies  Allergen Reactions  . Calcium Channel Blockers Other (See Comments)    Came to hospital in 1995-caused chest pain     Family history reviewed and not pertinent   Prior to Admission medications   Medication Sig Start Date End Date Taking? Authorizing Provider  budesonide-formoterol (SYMBICORT) 80-4.5 MCG/ACT inhaler Inhale 2 puffs into the lungs 2 (two) times  daily. 09/17/15  Yes Corky Sox, MD  albuterol (PROVENTIL HFA;VENTOLIN HFA) 108 (90 BASE) MCG/ACT inhaler Inhale 2 puffs into the lungs every 6 (six) hours as needed for wheezing. 12/10/14   Maryellen Pile, MD  allopurinol (ZYLOPRIM) 100 MG tablet Take 2 tablets (200 mg total) by mouth daily. 12/10/14 10/21/18  Maryellen Pile, MD  carvedilol (COREG) 25 MG tablet TAKE ONE TABLET BY MOUTH TWICE DAILY WITH MEALS Patient taking differently: Take 25 mg by mouth 2 (two) times daily with a meal.  11/13/15   Maryellen Pile, MD  cloNIDine (CATAPRES) 0.3 MG tablet Take 1 tablet (0.3 mg total) by mouth 2 (two) times daily. 03/05/15   Maryellen Pile, MD  docusate sodium (COLACE) 100 MG capsule Take 1-2 capsules (100-200 mg total) by mouth 2 (two) times daily as needed for mild constipation. 03/05/14   Kelby Aline, MD  finasteride (PROSCAR) 5 MG tablet TAKE ONE TABLET BY MOUTH ONCE DAILY 03/05/15   Maryellen Pile, MD  furosemide (LASIX) 20 MG tablet TAKE ONE TABLET BY MOUTH ONCE DAILY Patient taking differently: Take 40 mg by mouth daily.  12/27/15   Maryellen Pile, MD  Multiple Vitamins-Minerals (ICAPS AREDS 2 PO) Take 2 capsules by mouth daily.    [provider]  omeprazole (PRILOSEC) 20 MG capsule Take 1 capsule (20 mg total) by mouth daily. 12/10/14   Maryellen Pile, MD  PHENobarbital (LUMINAL) 64.8 MG tablet Take 1 tablet (64.8 mg total) by mouth 2 (two) times daily. 07/24/15   Corky Sox, MD  polyethylene glycol [facility] Floria Raveling) packet Take 17 g by mouth daily as needed for moderate constipation. 12/10/14   Maryellen Pile, MD  pravastatin (PRAVACHOL) 40 MG tablet Take 1 tablet (40 mg total) by mouth every evening. 12/10/14   Maryellen Pile, MD  SPIRIVA HANDIHALER 18 MCG inhalation capsule INHALE ONE DOSE BY MOUTH ONCE DAILY Patient taking differently: Place 18 mcg into inhaler and inhale daily.  09/27/15   Maryellen Pile, MD  tamsulosin (FLOMAX) 0.4 MG CAPS capsule Take 1 capsule (0.4 mg  total) by mouth daily. 03/05/15   Maryellen Pile, MD    Physical Exam: Vitals:   08/12/19 1600 08/12/19 1615 08/12/19 1630 08/12/19 1645  BP: 98/70 109/75 106/70 107/83  Pulse: 74 74 81   Resp: (!) 22 (!) 29 17 18   Temp:      SpO2: 98% 100% 97%   Weight:      Height:        General: alert and oriented to time, place, and person. Appear in moderate distress, affect anxious Eyes: PERRL, Conjunctiva normal ENT: Oral Mucosa Clear, moist  Neck: difficult to assess  JVD, no Abnormal Mass Or lumps Cardiovascular: S1 and S2 Present, no Murmur, peripheral pulses symmetrical Respiratory: increased respiratory effort, Bilateral Air entry equal and Decreased, no signs of  accessory muscle use, bilateral  Crackles, bilateral expiratory  wheezes Abdomen: Bowel Sound present, Soft and no tenderness, no hernia Skin: no rashes  Extremities: bilateral  Pedal edema, no calf tenderness Neurologic: without any new focal findings Gait not checked due to patient safety concerns  Data Reviewed: I have personally reviewed and interpreted labs, imaging as discussed below.  CBC: Recent Labs  Lab 08/12/19 1543  WBC 4.6  NEUTROABS 2.4  HGB 15.7  HCT 48.3  MCV 94.7  PLT 222*   Basic Metabolic Panel: Recent Labs  Lab 08/12/19 1543  NA 141  K 3.6  CL 98  CO2 32  GLUCOSE 126*  BUN 40*  CREATININE 1.64*  CALCIUM 8.6*   GFR: Estimated Creatinine Clearance: 47.7 mL/min (A) (by C-G formula based on SCr of 1.64 mg/dL (H)). Liver Function Tests: Recent Labs  Lab 08/12/19 1543  AST 35  ALT 35  ALKPHOS 62  BILITOT 0.7  PROT 6.1*  ALBUMIN 3.1*   No results for input(s): LIPASE, AMYLASE in the last 168 hours. No results for input(s): AMMONIA in the last 168 hours. Coagulation Profile: No results for input(s): INR, PROTIME in the last 168 hours. Cardiac Enzymes: No results for input(s): CKTOTAL, CKMB, CKMBINDEX, TROPONINI in the last 168 hours. BNP (last 3 results) No results for  input(s): PROBNP in the last 8760 hours. HbA1C: No results for input(s): HGBA1C in the last 72 hours. CBG: Recent Labs  Lab 08/12/19 1757  GLUCAP 95   Lipid Profile: Recent Labs    08/12/19 1543  TRIG 186*   Thyroid Function Tests: No results for input(s): TSH, T4TOTAL, FREET4, T3FREE, THYROIDAB in the last 72 hours. Anemia Panel: Recent Labs    08/12/19 1543  FERRITIN 99   Urine analysis:    Component Value Date/Time   COLORURINE ORANGE (A) 10/05/2012 1500   APPEARANCEUR Clear 04/01/2015 1427   LABSPEC 1.027 10/05/2012 1500   PHURINE 6.0 10/05/2012 1500   GLUCOSEU Negative 04/01/2015 1427   GLUCOSEU NEG mg/dL 01/10/2008 2127   HGBUR NEG 10/05/2012 1500   BILIRUBINUR Negative 04/01/2015 1427   KETONESUR TRACE (A) 10/05/2012 1500   PROTEINUR Negative 04/01/2015 1427   PROTEINUR NEG 10/05/2012 1500   UROBILINOGEN 1 10/05/2012 1500   NITRITE Negative 04/01/2015 1427   NITRITE NEG 10/05/2012 1500   LEUKOCYTESUR Negative 04/01/2015 1427    Radiological Exams on Admission: DG Chest Port 1 View  Result Date: 08/12/2019 CLINICAL DATA:  Shortness of breath, COVID positive EXAM: PORTABLE CHEST 1 VIEW COMPARISON:  06/29/2018 FINDINGS: Mild left basilar opacity, likely atelectasis. Lungs otherwise clear in this patient with known COVID. No pleural effusion or pneumothorax. Cardiomegaly.  Mild thoracic aortic atherosclerosis. IMPRESSION: Mild left basilar opacity, likely atelectasis. Lungs otherwise clear in this patient with known COVID. Cardiomegaly.  Thoracic aortic atherosclerosis. Electronically Signed   By: Julian Hy M.D.   On: 08/12/2019 16:08   I reviewed all nursing notes, pharmacy notes, vitals, pertinent old records.  Assessment/Plan 1. Acute hypoxemic respiratory failure due to COVID-19 [facility]) Acute COVID-19 Viral illness Lab Results  Component Value Date   SARSCOV2NAA NOT DETECTED 10/21/2018   CXR: hazy bilateral peripheral opacities  Recent Labs     08/12/19 1543  DDIMER 0.55*  FERRITIN 99  LDH 209*  CRP 1.3*    Tmax last 24 hours:  Temp (24hrs), Avg:98.9 F (37.2 C), Min:98.9 F (37.2 C), Max:98.9 F (37.2 C)   Oxygen requirements: On 3 LPM.  86% on room air.  Antibiotics:  None Diuretics: Currently on hold Vitamin C and Zinc: Started on 08/12/2019 DVT Prophylaxis: Subcutaneous Heparin   Remdesivir: Started on 08/12/2019 Steroids: Decadron 6 mg started on 08/12/2019 Actemra: Discussed with patient regarding benefits of Actemra. Currently not indicated. Patient verbalized understanding and has provided consent to use this medicine.  This patient has confirmed COVID-19 in the setting of the ongoing 2020 coronavirus pandemic.  He has hypoxia and is high-risk for intubation, (due to age/BMI >35/CAD/CRP > 14 mg/dL, or troponin >=0.1 ng/dL) but expected to survive >48 hours and has good baseline functional status.  he is not known to be on immunomodulators, anti-rejection medications, or cancer chemotherapy, has no history of TB or latent TB, and no history of diverticulitis or intestinal perforation.  Platelets are >50K, ANC is >500, and ALT/AST are below 5x ULN with no known hepatitis B infection. The investigational nature of this medication was discussed with the patient/HCPOA and they choose to proceed as the potential benefits are felt to outweigh risks at this time.  -Monitor for infusion reaction  Prone positioning: Patient encouraged to stay in prone position as much as possible.  PPE During this encounter: Patient Isolation: Airborne + Droplet + Contact HCP PPE: CAPR, gown. gloves Patient PPE: None  The treatment plan and use of medications and known side effects were discussed with patient/family. It was clearly explained that there is no proven definitive treatment for COVID-19 infection yet. Any medications used here are based on case reports/anecdotal data which are not peer-reviewed and has not been studied using  randomized control trials.  Complete risks and long-term side effects are unknown, however in the best clinical judgment they seem to be of some clinical benefit rather than medical risks.  Patient/family agree with the treatment plan and want to receive these treatments as indicated.   2.  Chronic combined systolic and diastolic CHF. Chronic kidney disease stage IIIB Essential hypertension Chronic leg edema Patient reports chronic swelling of his legs. Appears to have some faint basal crackles. Clinically appearing volume overloaded. Currently with soft blood pressure avoiding diuresis. Last echocardiogram in our system was in December 2016. We will repeat echocardiogram. Patient is on Coreg 25 mg twice daily, clonidine 0.3 mg twice daily, Lasix 20 mg daily.  We will hold all medications. Patient does not take any "blood thinner" or aspirin. Unna boots for leg swelling. Renal function stable.  Monitor ins and outs and daily weight.  3.  Traumatic brain injury. History of seizure disorder. On phenobarbital we will continue the same.  4.  Hyperlipidemia. Continue Pravachol.  5.  BPH. Continue Flomax and Proscar.  6.  GERD. Continuing PPI.  Nutrition: Cardiac diet DVT Prophylaxis: Subcutaneous Heparin   Advance goals of care discussion: Full code   Consults: none   Family Communication: no family was present at bedside, at the time of interview.   Disposition:  Pt is from home, admitted with acute COVID-19 illness and hypoxia. The patient's physical exam findings of saturations in 80s on room air and initial radiographic and laboratory data bilateral infiltrates and positive COVID-19 antigen in the context of their chronic comorbidities like chronic combined CHF, chronic kidney disease is felt to place them at high risk for further clinical deterioration. Pt will require inpatient hospital care spanning beyond 2 midnights. Discharge to home, in 2-3 days.  Author: Berle Mull, MD [facility] 08/12/2019 6:22 PM   To reach On-call, see care teams to locate the attending and reach out to them via  [email or web address] If 7PM-7AM, please contact night-coverage If you still have difficulty reaching the attending provider, please page the [facility] (Director on Call) for [facility] on amion for assistance.

## 2019-08-12 NOTE — ED Triage Notes (Signed)
Pt BIB [facility] from urgent care after being seen there for shortness of breath. Pt tested positive for covid today, reports his wife is also positive. Pt 88% on room air at urgent care placed on 4L by EMS and arrives at 100% on 4L. Pt complaining of generalized aching. PMH of COPD/CHF

## 2019-08-12 NOTE — ED Provider Notes (Signed)
[facility]    CSN: [id number] Arrival date & time: 08/12/19  1245      History   Chief Complaint Chief Complaint  Patient presents with  . Labs Only    HPI Ricky Hayden is a 74 y.o. male with history of hypertension, CHF, COPD presenting for 4-5-day course of increased fatigue, malaise.  Patient provides history which is limited second to patient cooperation.  Patient denying chest pain.  States his breathing "is always bad".  Patient does endorse dyspnea with exertion; no shortness of breath at rest.  Additional history provided by patient's daughter with his permission [name]): Patient does not have home O2, typically has saturations in the mid 80s.  Patient obtains health care through the [place]: Reportedly did not qualify for home O2.  No change in appetite, bowel or bladder habit.  Patient has chronic cough.  Patient's wife tested positive for Covid and is currently in hospital: Patient's wife developed symptoms around the same time as patient.   Past Medical History:  Diagnosis Date  . Arthritis    osteoarthritis of left knee  . Balanitis    recurrent  . Cardiac arrhythmia    life threatening, secondary to CCB vs b- blockers  . Chronic joint pain   . COPD (chronic obstructive pulmonary disease) ([place])   . Coronary artery disease   . Enlarged prostate   . 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 GERD (gastroesophageal reflux disease)   . Gout   . Hiatal hernia   . Hyperlipidemia   . Hypertension   . Intracranial hematoma ([place]) 1995   history of, s/p evacuation by Dr. Sherwood Gambler  . Nocturia   . PONV (postoperative nausea and vomiting)   . Sinusitis    s/p ethmoidectomy and nasal septoplasty  . Vertigo    intermitantly    Patient Active Problem List   Diagnosis Date Noted  . Acute hypoxemic respiratory failure due to COVID-19 ([place]) 08/12/2019  . CKD (chronic kidney disease) stage 3, GFR 30-59 ml/min 08/12/2019  . COPD with  acute exacerbation ([place]) 08/12/2019  . Hyperkalemia   . Acute renal injury ([place]) 10/21/2018  . Respiratory tract congestion with cough 06/18/2015  . Chronic combined systolic and diastolic CHF, NYHA class 2 ([place]) 06/10/2015  . Leg swelling 07/16/2014  . Preventative health care 03/07/2014  . COPD (chronic obstructive pulmonary disease) ([place]) 09/11/2013  . BPPV (benign paroxysmal positional vertigo) 10/05/2012  . BPH (benign prostatic hyperplasia) 01/10/2008  . Gout 04/22/2006  . ERECTILE DYSFUNCTION 04/22/2006  . 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 Hyperlipidemia 03/15/2006  . Essential hypertension 03/15/2006  . GERD 03/15/2006  . HIATAL HERNIA 03/15/2006  . OSTEOARTHRITIS 03/15/2006  . 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 CIRCUMCISION N/A 11/20/2013   Procedure: CIRCUMCISION ADULT;  Surgeon: Claybon Jabs, MD;  Location: [facility];  Service: Urology;  Laterality: N/A;  . [place]   hematomy due to sinus infection   . shoulder surg rt   1995  . SINUS SURGERY WITH INSTATRAK     ethmoidectomy and nasal septum repair       Home Medications    Prior to Admission medications   Medication Sig Start Date End Date Taking? Authorizing Provider  albuterol (PROVENTIL HFA;VENTOLIN HFA) 108 (90 BASE) MCG/ACT inhaler Inhale 2 puffs into the lungs every 6 (six) hours as needed for wheezing. 12/10/14   Maryellen Pile, MD  allopurinol (ZYLOPRIM) 100 MG tablet Take 2 tablets (200 mg total) by mouth daily. 12/10/14 08/12/19  Maryellen Pile, MD  budesonide-formoterol (SYMBICORT) 80-4.5 MCG/ACT inhaler Inhale 2 puffs into the lungs 2 (two) times daily. 09/17/15   Corky Sox, MD  carvedilol (COREG) 25 MG tablet TAKE ONE TABLET BY MOUTH TWICE DAILY WITH MEALS Patient taking differently: Take 25 mg by mouth 2 (two) times daily with a meal.  11/13/15   Maryellen Pile, MD  cloNIDine (CATAPRES) 0.3 MG tablet Take 1 tablet (0.3 mg total) by  mouth 2 (two) times daily. 03/05/15   Maryellen Pile, MD  diclofenac Sodium (VOLTAREN) 1 % GEL Apply 2 g topically daily as needed (pain).    [provider]  docusate sodium (COLACE) 100 MG capsule Take 1-2 capsules (100-200 mg total) by mouth 2 (two) times daily as needed for mild constipation. 03/05/14   Kelby Aline, MD  finasteride (PROSCAR) 5 MG tablet TAKE ONE TABLET BY MOUTH ONCE DAILY Patient taking differently: Take 5 mg by mouth daily.  03/05/15   Maryellen Pile, MD  furosemide (LASIX) 20 MG tablet TAKE ONE TABLET BY MOUTH ONCE DAILY Patient taking differently: Take 40 mg by mouth daily.  12/27/15   Maryellen Pile, MD  Multiple Vitamins-Minerals (ICAPS AREDS 2 PO) Take 2 capsules by mouth daily.    [provider]  omeprazole (PRILOSEC) 20 MG capsule Take 1 capsule (20 mg total) by mouth daily. 12/10/14   Maryellen Pile, MD  PHENobarbital (LUMINAL) 64.8 MG tablet Take 1 tablet (64.8 mg total) by mouth 2 (two) times daily. 07/24/15   Corky Sox, MD  polyethylene glycol [facility] / Floria Raveling) packet Take 17 g by mouth daily as needed for moderate constipation. 12/10/14   Maryellen Pile, MD  pravastatin (PRAVACHOL) 40 MG tablet Take 1 tablet (40 mg total) by mouth every evening. 12/10/14   Maryellen Pile, MD  SPIRIVA HANDIHALER 18 MCG inhalation capsule INHALE ONE DOSE BY MOUTH ONCE DAILY Patient taking differently: Place 18 mcg into inhaler and inhale daily.  09/27/15   Maryellen Pile, MD  tamsulosin (FLOMAX) 0.4 MG CAPS capsule Take 1 capsule (0.4 mg total) by mouth daily. 03/05/15   Maryellen Pile, MD  torsemide (DEMADEX) 20 MG tablet Take 40 mg by mouth 2 (two) times daily.    [provider]    Family History History reviewed. No pertinent family history.  Social History Social History   Tobacco Use  . Smoking status: Former Smoker    Quit date: 05/04/1986    Years since quitting: 33.2  . Smokeless tobacco: Never Used  Substance Use Topics  . Alcohol  use: No    Alcohol/week: 0.0 standard drinks  . Drug use: No     Allergies   Calcium channel blockers   Review of Systems As per HPI   Physical Exam Triage Vital Signs ED Triage Vitals  Enc Vitals Group     BP 08/12/19 1326 93/65     Pulse Rate 08/12/19 1326 71     Resp 08/12/19 1326 (!) 24     Temp 08/12/19 1326 98.9 F (37.2 C)     Temp Source 08/12/19 1326 Oral     SpO2 08/12/19 1330 (!) 87 %     Weight --      Height --      Head Circumference --      Peak Flow --      Pain Score 08/12/19 1322 0     Pain  Loc --      Pain Edu? --      Excl. in [place]? --    No data found.  Updated Vital Signs BP (!) 90/59 (BP Location: Left Arm)   Pulse 71   Temp 98.9 F (37.2 C) (Oral)   Resp (!) 24   SpO2 97%   Visual Acuity Right Eye Distance:   Left Eye Distance:   Bilateral Distance:    Right Eye Near:   Left Eye Near:    Bilateral Near:     Physical Exam Constitutional:      General: He is not in acute distress.    Appearance: He is obese. He is ill-appearing. He is not toxic-appearing or diaphoretic.  HENT:     Head: Normocephalic and atraumatic.     Right Ear: Tympanic membrane, ear canal and external ear normal.     Left Ear: Tympanic membrane, ear canal and external ear normal.     Nose: Nose normal.     Mouth/Throat:     Mouth: Mucous membranes are moist.     Pharynx: Oropharynx is clear.  Eyes:     General: No scleral icterus.    Conjunctiva/sclera: Conjunctivae normal.     Pupils: Pupils are equal, round, and reactive to light.  Cardiovascular:     Rate and Rhythm: Normal rate and regular rhythm.  Pulmonary:     Effort: Pulmonary effort is normal. No respiratory distress.     Breath sounds: Wheezing and rhonchi present.     Comments: Decreased air movement bilaterally Musculoskeletal:     Cervical back: Neck supple. No tenderness.     Right lower leg: Edema present.     Left lower leg: Edema present.     Comments: 2+ pitting: Chronic/stable  per patient  Lymphadenopathy:     Cervical: No cervical adenopathy.  Skin:    Coloration: Skin is not jaundiced or pale.     Findings: No rash.  Neurological:     Mental Status: He is alert and oriented to person, place, and time.      UC Treatments / Results  Labs (all labs ordered are listed, but only abnormal results are displayed) Labs Reviewed  POC SARS CORONAVIRUS 2 AG -  ED - Abnormal; Notable for the following components:      Result Value   SARS Coronavirus 2 Ag Positive (*)    All other components within normal limits    EKG   Radiology DG Chest Port 1 View  Result Date: 08/12/2019 CLINICAL DATA:  Shortness of breath, COVID positive EXAM: PORTABLE CHEST 1 VIEW COMPARISON:  06/29/2018 FINDINGS: Mild left basilar opacity, likely atelectasis. Lungs otherwise clear in this patient with known COVID. No pleural effusion or pneumothorax. Cardiomegaly.  Mild thoracic aortic atherosclerosis. IMPRESSION: Mild left basilar opacity, likely atelectasis. Lungs otherwise clear in this patient with known COVID. Cardiomegaly.  Thoracic aortic atherosclerosis. Electronically Signed   By: Julian Hy M.D.   On: 08/12/2019 16:08    Procedures Procedures (including critical care time)  Medications Ordered in UC Medications - No data to display  Initial Impression / Assessment and Plan / UC Course  I have reviewed the triage vital signs and the nursing notes.  Pertinent labs & imaging results that were available during my care of the patient were reviewed by me and considered in my medical decision making (see chart for details).     Patient afebrile, nontoxic in office today.  Breathing effort and rate normal  at bedside.  Patient is hypotensive and hypoxic (82-92% on room air).  Patient given supplemental O2: 4 L via [place] with saturations >96%.  Rapid Covid obtained: Positive.  Per history that was obtained by daughter this is likely chronic, though given patient's increased  malaise, fatigue, overall health status and comorbidities with acute COVID-19 infection, recommended patient go to ER for further management.  Patient daughter gave permission to transport via EMS.  This is done with patient in stable condition.  Return precautions discussed, patient verbalized understanding and is agreeable to plan. Final Clinical Impressions(s) / UC Diagnoses   Final diagnoses:  TYVDP-32 virus infection  Hypoxia  Hypotension, unspecified hypotension type   Discharge Instructions   None    ED Prescriptions    None     PDMP not reviewed this encounter.   Neldon Mc [place], [place] 08/13/19 2567

## 2019-08-13 ENCOUNTER — Inpatient Hospital Stay (HOSPITAL_COMMUNITY): Payer: No Typology Code available for payment source

## 2019-08-13 DIAGNOSIS — N1832 Chronic kidney disease, stage 3b: Secondary | ICD-10-CM

## 2019-08-13 DIAGNOSIS — I5042 Chronic combined systolic (congestive) and diastolic (congestive) heart failure: Secondary | ICD-10-CM

## 2019-08-13 DIAGNOSIS — I340 Nonrheumatic mitral (valve) insufficiency: Secondary | ICD-10-CM

## 2019-08-13 DIAGNOSIS — N400 Enlarged prostate without lower urinary tract symptoms: Secondary | ICD-10-CM | POA: Diagnosis not present

## 2019-08-13 DIAGNOSIS — U071 COVID-19: Secondary | ICD-10-CM | POA: Diagnosis not present

## 2019-08-13 DIAGNOSIS — I351 Nonrheumatic aortic (valve) insufficiency: Secondary | ICD-10-CM

## 2019-08-13 DIAGNOSIS — I10 Essential (primary) hypertension: Secondary | ICD-10-CM

## 2019-08-13 LAB — COMPREHENSIVE METABOLIC PANEL
ALT: 33 U/L (ref 0–44)
AST: 32 U/L (ref 15–41)
Albumin: 3.1 g/dL — ABNORMAL LOW (ref 3.5–5.0)
Alkaline Phosphatase: 67 U/L (ref 38–126)
Anion gap: 11 (ref 5–15)
BUN: 35 mg/dL — ABNORMAL HIGH (ref 8–23)
CO2: 36 mmol/L — ABNORMAL HIGH (ref 22–32)
Calcium: 8.7 mg/dL — ABNORMAL LOW (ref 8.9–10.3)
Chloride: 98 mmol/L (ref 98–111)
Creatinine, Ser: 1.61 mg/dL — ABNORMAL HIGH (ref 0.61–1.24)
GFR calc Af Amer: 48 mL/min — ABNORMAL LOW (ref >60)
GFR calc non Af Amer: 41 mL/min — ABNORMAL LOW (ref >60)
Glucose, Bld: 163 mg/dL — ABNORMAL HIGH (ref 70–99)
Potassium: 3.7 mmol/L (ref 3.5–5.1)
Sodium: 145 mmol/L (ref 135–145)
Total Bilirubin: 0.6 mg/dL (ref 0.3–1.2)
Total Protein: 6.4 g/dL — ABNORMAL LOW (ref 6.5–8.1)

## 2019-08-13 LAB — GLUCOSE, CAPILLARY
Glucose-Capillary: 135 mg/dL — ABNORMAL HIGH (ref 70–99)
Glucose-Capillary: 135 mg/dL — ABNORMAL HIGH (ref 70–99)
Glucose-Capillary: 180 mg/dL — ABNORMAL HIGH (ref 70–99)
Glucose-Capillary: 162 mg/dL — ABNORMAL HIGH (ref 70–99)

## 2019-08-13 LAB — CBC WITH DIFFERENTIAL/PLATELET
Abs Immature Granulocytes: 0.05 10*3/uL (ref 0.00–0.07)
Basophils Absolute: 0 10*3/uL (ref 0.0–0.1)
Basophils Relative: 0 %
Eosinophils Absolute: 0 10*3/uL (ref 0.0–0.5)
Eosinophils Relative: 0 %
HCT: 48.3 % (ref 39.0–52.0)
Hemoglobin: 15.1 g/dL (ref 13.0–17.0)
Immature Granulocytes: 1 %
Lymphocytes Relative: 15 %
Lymphs Abs: 0.6 10*3/uL — ABNORMAL LOW (ref 0.7–4.0)
MCH: 29.8 pg (ref 26.0–34.0)
MCHC: 31.3 g/dL (ref 30.0–36.0)
MCV: 95.3 fL (ref 80.0–100.0)
Monocytes Absolute: 0.3 10*3/uL (ref 0.1–1.0)
Monocytes Relative: 8 %
Neutro Abs: 3 10*3/uL (ref 1.7–7.7)
Neutrophils Relative %: 76 %
Platelets: 101 10*3/uL — ABNORMAL LOW (ref 150–400)
RBC: 5.07 MIL/uL (ref 4.22–5.81)
RDW: 14.2 % (ref 11.5–15.5)
WBC: 4 10*3/uL (ref 4.0–10.5)
nRBC: 0.8 % — ABNORMAL HIGH (ref 0.0–0.2)

## 2019-08-13 LAB — D-DIMER, QUANTITATIVE: D-Dimer, Quant: 0.52 ug/mL-FEU — ABNORMAL HIGH (ref 0.00–0.50)

## 2019-08-13 LAB — MAGNESIUM: Magnesium: 2.1 mg/dL (ref 1.7–2.4)

## 2019-08-13 LAB — ECHOCARDIOGRAM COMPLETE
Height: 68 in
Weight: 4016 oz

## 2019-08-13 LAB — C-REACTIVE PROTEIN: CRP: 1 mg/dL — ABNORMAL HIGH (ref <1.0)

## 2019-08-13 MED ORDER — CARVEDILOL 6.25 MG PO TABS
6.2500 mg | ORAL_TABLET | Freq: Two times a day (BID) | ORAL | Status: DC
  Administered 2019-08-13 – 2019-08-15 (×5): 6.25 mg via ORAL
  Filled 2019-08-13 (×6): qty 1

## 2019-08-13 MED ORDER — FUROSEMIDE 10 MG/ML IJ SOLN
40.0000 mg | Freq: Four times a day (QID) | INTRAMUSCULAR | Status: AC
  Administered 2019-08-13 (×2): 40 mg via INTRAVENOUS
  Filled 2019-08-13 (×2): qty 4

## 2019-08-13 MED ORDER — IPRATROPIUM-ALBUTEROL 20-100 MCG/ACT IN AERS
1.0000 | INHALATION_SPRAY | Freq: Four times a day (QID) | RESPIRATORY_TRACT | Status: DC
  Administered 2019-08-13 – 2019-08-19 (×24): 1 via RESPIRATORY_TRACT

## 2019-08-13 MED ORDER — PERFLUTREN LIPID MICROSPHERE
1.0000 mL | INTRAVENOUS | Status: AC | PRN
  Administered 2019-08-13: 14:00:00 2 mL via INTRAVENOUS
  Filled 2019-08-13: qty 10

## 2019-08-13 MED ORDER — METHYLPREDNISOLONE SODIUM SUCC 40 MG IJ SOLR
40.0000 mg | Freq: Three times a day (TID) | INTRAMUSCULAR | Status: DC
  Administered 2019-08-13 – 2019-08-14 (×3): 40 mg via INTRAVENOUS
  Filled 2019-08-13 (×3): qty 1

## 2019-08-13 NOTE — Progress Notes (Signed)
  Echocardiogram 2D Echocardiogram has been performed.  Ricky Hayden 08/13/2019, 1:34 PM

## 2019-08-13 NOTE — Progress Notes (Signed)
Orthopedic Tech Progress Note Patient Details:  Ricky Hayden Aug 21, 1945 [id number]  Ortho Devices Type of Ortho Device: Haematologist Ortho Device/Splint Location: bi-lateral Ortho Device/Splint Interventions: Ordered, Application, Adjustment   Post Interventions Patient Tolerated: Well Instructions Provided: Care of device, Adjustment of device   [name] 08/13/2019, 5:41 AM

## 2019-08-13 NOTE — Progress Notes (Signed)
PROGRESS NOTE                                                                                                                                                                                                             Patient Demographics:    Ricky Hayden, is a 74 y.o. male, DOB - [birth year], WCB:[id number]  Admit date - 08/12/2019   Admitting Physician Lavina Hamman, MD  Outpatient Primary MD for the patient is Patient, No Pcp Per  LOS - 1   Chief Complaint  Patient presents with  . Shortness of Breath       Brief Narrative    74 y.o. male with Past medical history of chronic combined CHF, CAD, BPH, GERD, gout, hiatal hernia, HTN, COPD, CKD 3b.  Patient presents to ED secondary to complaints of generalized weakness, low blood pressure, as well as shortness of breath, patient was found to have low blood pressure in urgent care, where he was transferred to [facility] where he was noted to have COVID-19 positive, with evidence of COVID-19 pneumonia on imaging and hypoxia with oxygen requirement, he was admitted for further management.   Subjective:    Edie Darley today reports some dyspnea with exertion, reports some cough, reports generalized weakness, denies any fever, chills, chest pain.     Assessment  & Plan :    Principal Problem:   Acute hypoxemic respiratory failure due to COVID-19 [facility]) Active Problems:   Hyperlipidemia   Gout   Essential hypertension   GERD   BPH (benign prostatic hyperplasia)   Traumatic brain injury ([place])   Leg swelling   Chronic combined systolic and diastolic CHF, NYHA class 2 (HCC)   CKD (chronic kidney disease) stage 3, GFR 30-59 ml/min   COPD with acute exacerbation (HCC)  Acute hypoxic respiratory failure due to COVID-19 pneumonia -Patient with known underlying COPD, but no oxygen requirement at baseline, he was noted to be hypoxic in ED requiring some oxygen, chest x-ray significant for multifocal  pneumonia COVID-19. -Continue with IV steroids, will change Decadron to IV Solu-Medrol 40 mg every 8 hours given decreased air entry bilaterally and underlying history of COPD. -continue With IV remdesivir. -So far no indication for Actemra given low CRP and low oxygen requirement.   [place]    08/12/19 1543 08/13/19 0231  DDIMER 0.55* 0.52*  FERRITIN 99  --   LDH 209*  --   CRP 1.3* 1.0*    Lab Results  Component Value Date   SARSCOV2NAA NOT DETECTED 10/21/2018   Chronic combined systolic and diastolic CHF. -Appears to be with some volume overload, will diurese and monitor renal function closely - follow on 2 D echo -was seen by Dr. Caryl Comes recently -Will give 40 mg of IV Lasix x2 today.  COPD -With some diminished air entry, will increase his Solu-Medrol to 40 mg every 8 hours and add Combivent 4 times daily.  Chronic kidney disease stage IIIB -Function appears to be at baseline, continue to monitor closely as on diuresis  Essential hypertension - Patient is on Coreg 25 mg twice daily, clonidine 0.3 mg twice daily, Lasix 20 mg daily.  Continue with Coreg, hold clonidine for now given soft blood pressure and as needed diuresis.  Chronic lower extremity edema - Unna boots for leg swelling.  Traumatic brain injury. -Continue with supportive care History of seizure disorder. - On phenobarbital we will continue the same.  Hyperlipidemia. - Continue Pravachol.  BPH. - Continue Flomax and Proscar.  GERD. - Continuing PPI  Code Status : Full Code  Family Communication  : D/W daughter Ashly via phone  Disposition Plan  : Home  Barriers For Discharge : Hypoxic, on IV steroids and remdesivir  Consults  :  None  Procedures  : None  DVT Prophylaxis  :  Kay heparin  Lab Results  Component Value Date   PLT 101 (L) 08/13/2019    Antibiotics  :    Anti-infectives (From admission, onward)   Start     Dose/Rate Route Frequency Ordered  Stop   08/13/19 1000  remdesivir 100 mg in sodium chloride 0.9 % 100 mL IVPB     100 mg 200 mL/hr over 30 Minutes Intravenous Daily 08/12/19 1643 08/17/19 0959   08/12/19 1645  remdesivir 200 mg in sodium chloride 0.9% 250 mL IVPB     200 mg 580 mL/hr over 30 Minutes Intravenous Once 08/12/19 1643 08/12/19 1906        Objective:   Vitals:   08/12/19 2105 08/13/19 0024 08/13/19 0400 08/13/19 0800  BP: 110/82 115/75 118/87 (!) 118/98  Pulse:  91 88 90  Resp:  (!) 23 (!) 22 20  Temp:  98.9 F (37.2 C) 97.8 F (36.6 C) 98.3 F (36.8 C)  TempSrc:  Oral Oral Oral  SpO2:  92% 97% 97%  Weight:      Height:        Wt Readings from Last 3 Encounters:  08/12/19 113.9 kg  08/10/19 113.9 kg  10/22/18 108.8 kg     Intake/Output Summary (Last 24 hours) at 08/13/2019 1200 Last data filed at 08/13/2019 1000 Gross per 24 hour  Intake 290 ml  Output 300 ml  Net -10 ml     Physical Exam  Awake Alert, Oriented X 3, No new F.N deficits, Normal affect Symmetrical Chest wall movement, diminished air entry bilaterally. RRR,No Gallops,Rubs or new Murmurs, No Parasternal Heave +ve B.Sounds, Abd Soft, No tenderness, No rebound - guarding or rigidity. No Cyanosis, Clubbing ,+2 edema (unna boot) No new Rash or bruise      Data Review:    CBC Recent Labs  Lab 08/12/19 1543 08/13/19 0231  WBC 4.6 4.0  HGB 15.7 15.1  HCT 48.3 48.3  PLT 111* 101*  MCV 94.7 95.3  MCH 30.8 29.8  MCHC 32.5  31.3  RDW 14.2 14.2  LYMPHSABS 1.2 0.6*  MONOABS 0.9 0.3  EOSABS 0.0 0.0  BASOSABS 0.0 0.0    Chemistries  Recent Labs  Lab 08/12/19 1543 08/13/19 0231  NA 141 145  K 3.6 3.7  CL 98 98  CO2 32 36*  GLUCOSE 126* 163*  BUN 40* 35*  CREATININE 1.64* 1.61*  CALCIUM 8.6* 8.7*  MG  --  2.1  AST 35 32  ALT 35 33  ALKPHOS 62 67  BILITOT 0.7 0.6   ------------------------------------------------------------------------------------------------------------------ Recent Labs     08/12/19 1543  TRIG 186*    Lab Results  Component Value Date   HGBA1C 5.4 10/05/2012   ------------------------------------------------------------------------------------------------------------------ No results for input(s): TSH, T4TOTAL, T3FREE, THYROIDAB in the last 72 hours.  Invalid input(s): FREET3 ------------------------------------------------------------------------------------------------------------------ Recent Labs    08/12/19 1543  FERRITIN 99    Coagulation profile No results for input(s): INR, PROTIME in the last 168 hours.  Recent Labs    08/12/19 1543 08/13/19 0231  DDIMER 0.55* 0.52*    Cardiac Enzymes No results for input(s): CKMB, TROPONINI, MYOGLOBIN in the last 168 hours.  Invalid input(s): CK ------------------------------------------------------------------------------------------------------------------ No results found for: BNP  Inpatient Medications  Scheduled Meds: . vitamin C  500 mg Oral Daily  . carvedilol  6.25 mg Oral BID WC  . dexamethasone (DECADRON) injection  6 mg Intravenous Q24H  . finasteride  5 mg Oral Daily  . heparin  5,000 Units Subcutaneous Q8H  . insulin aspart  0-5 Units Subcutaneous QHS  . insulin aspart  0-9 Units Subcutaneous TID WC  . Ipratropium-Albuterol  1 puff Inhalation Q6H  . mometasone-formoterol  2 puff Inhalation BID  . pantoprazole  40 mg Oral Daily  . PHENobarbital  64.8 mg Oral BID  . pravastatin  40 mg Oral QPM  . sodium chloride  1-2 spray Each Nare Q2H  . tamsulosin  0.4 mg Oral Daily  . umeclidinium bromide  18 puff Inhalation Daily  . zinc sulfate  220 mg Oral Daily   Continuous Infusions: . remdesivir 100 mg in NS 100 mL 100 mg (08/13/19 1117)   PRN Meds:.acetaminophen, guaiFENesin-dextromethorphan, ondansetron **OR** ondansetron (ZOFRAN) IV  Micro Results Recent Results (from the past 240 hour(s))  Blood Culture (routine x 2)     Status: None (Preliminary result)   Collection  Time: 08/12/19  4:56 PM   Specimen: BLOOD  Result Value Ref Range Status   Specimen Description BLOOD SITE NOT SPECIFIED  Final   Special Requests   Final    BOTTLES DRAWN AEROBIC AND ANAEROBIC Blood Culture adequate volume   Culture   Final    NO GROWTH < 24 HOURS Performed at [facility], [place] [address], [place], [place] [address]    Report Status PENDING  Incomplete  Blood Culture (routine x 2)     Status: None (Preliminary result)   Collection Time: 08/12/19  5:02 PM   Specimen: BLOOD  Result Value Ref Range Status   Specimen Description BLOOD LEFT ANTECUBITAL  Final   Special Requests   Final    BOTTLES DRAWN AEROBIC AND ANAEROBIC Blood Culture adequate volume   Culture   Final    NO GROWTH < 24 HOURS Performed at [facility], [place] [address], [place],  [address]    Report Status PENDING  Incomplete    Radiology Reports DG Chest Port 1 View  Result Date: 08/12/2019 CLINICAL DATA:  Shortness of breath, COVID positive EXAM: PORTABLE  CHEST 1 VIEW COMPARISON:  06/29/2018 FINDINGS: Mild left basilar opacity, likely atelectasis. Lungs otherwise clear in this patient with known COVID. No pleural effusion or pneumothorax. Cardiomegaly.  Mild thoracic aortic atherosclerosis. IMPRESSION: Mild left basilar opacity, likely atelectasis. Lungs otherwise clear in this patient with known COVID. Cardiomegaly.  Thoracic aortic atherosclerosis. Electronically Signed   By: Julian Hy M.D.   On: 08/12/2019 16:08      Phillips Climes M.D on 08/13/2019 at 12:00 PM   After 7pm go to [email or web address] - password [email or web address]  [facility] -  Office  [phone]

## 2019-08-13 NOTE — Plan of Care (Signed)
  Problem: Clinical Measurements: Goal: Respiratory complications will improve Outcome: Progressing   

## 2019-08-14 ENCOUNTER — Encounter (HOSPITAL_COMMUNITY): Payer: Self-pay | Admitting: Internal Medicine

## 2019-08-14 DIAGNOSIS — J9601 Acute respiratory failure with hypoxia: Secondary | ICD-10-CM

## 2019-08-14 DIAGNOSIS — J441 Chronic obstructive pulmonary disease with (acute) exacerbation: Secondary | ICD-10-CM | POA: Diagnosis not present

## 2019-08-14 DIAGNOSIS — I5023 Acute on chronic systolic (congestive) heart failure: Secondary | ICD-10-CM | POA: Diagnosis not present

## 2019-08-14 DIAGNOSIS — U071 COVID-19: Principal | ICD-10-CM

## 2019-08-14 LAB — CBC WITH DIFFERENTIAL/PLATELET
Abs Immature Granulocytes: 0.02 10*3/uL (ref 0.00–0.07)
Basophils Absolute: 0 10*3/uL (ref 0.0–0.1)
Basophils Relative: 0 %
Eosinophils Absolute: 0 10*3/uL (ref 0.0–0.5)
Eosinophils Relative: 0 %
HCT: 49.6 % (ref 39.0–52.0)
Hemoglobin: 15.4 g/dL (ref 13.0–17.0)
Immature Granulocytes: 1 %
Lymphocytes Relative: 11 %
Lymphs Abs: 0.5 10*3/uL — ABNORMAL LOW (ref 0.7–4.0)
MCH: 29.8 pg (ref 26.0–34.0)
MCHC: 31 g/dL (ref 30.0–36.0)
MCV: 95.9 fL (ref 80.0–100.0)
Monocytes Absolute: 0.2 10*3/uL (ref 0.1–1.0)
Monocytes Relative: 6 %
Neutro Abs: 3.6 10*3/uL (ref 1.7–7.7)
Neutrophils Relative %: 82 %
Platelets: 99 10*3/uL — ABNORMAL LOW (ref 150–400)
RBC: 5.17 MIL/uL (ref 4.22–5.81)
RDW: 13.9 % (ref 11.5–15.5)
WBC: 4.3 10*3/uL (ref 4.0–10.5)
nRBC: 0 % (ref 0.0–0.2)

## 2019-08-14 LAB — COMPREHENSIVE METABOLIC PANEL
ALT: 38 U/L (ref 0–44)
AST: 35 U/L (ref 15–41)
Albumin: 3.2 g/dL — ABNORMAL LOW (ref 3.5–5.0)
Alkaline Phosphatase: 65 U/L (ref 38–126)
Anion gap: 10 (ref 5–15)
BUN: 30 mg/dL — ABNORMAL HIGH (ref 8–23)
CO2: 37 mmol/L — ABNORMAL HIGH (ref 22–32)
Calcium: 8.8 mg/dL — ABNORMAL LOW (ref 8.9–10.3)
Chloride: 95 mmol/L — ABNORMAL LOW (ref 98–111)
Creatinine, Ser: 1.3 mg/dL — ABNORMAL HIGH (ref 0.61–1.24)
GFR calc Af Amer: >60 mL/min (ref >60)
GFR calc non Af Amer: 53 mL/min — ABNORMAL LOW (ref >60)
Glucose, Bld: 159 mg/dL — ABNORMAL HIGH (ref 70–99)
Potassium: 4 mmol/L (ref 3.5–5.1)
Sodium: 142 mmol/L (ref 135–145)
Total Bilirubin: 0.7 mg/dL (ref 0.3–1.2)
Total Protein: 6.6 g/dL (ref 6.5–8.1)

## 2019-08-14 LAB — GLUCOSE, CAPILLARY
Glucose-Capillary: 155 mg/dL — ABNORMAL HIGH (ref 70–99)
Glucose-Capillary: 160 mg/dL — ABNORMAL HIGH (ref 70–99)
Glucose-Capillary: 153 mg/dL — ABNORMAL HIGH (ref 70–99)
Glucose-Capillary: 160 mg/dL — ABNORMAL HIGH (ref 70–99)

## 2019-08-14 LAB — MAGNESIUM: Magnesium: 2.2 mg/dL (ref 1.7–2.4)

## 2019-08-14 LAB — BRAIN NATRIURETIC PEPTIDE: B Natriuretic Peptide: 1462.8 pg/mL — ABNORMAL HIGH (ref 0.0–100.0)

## 2019-08-14 LAB — D-DIMER, QUANTITATIVE: D-Dimer, Quant: 0.55 ug/mL-FEU — ABNORMAL HIGH (ref 0.00–0.50)

## 2019-08-14 LAB — C-REACTIVE PROTEIN: CRP: 1.5 mg/dL — ABNORMAL HIGH (ref <1.0)

## 2019-08-14 MED ORDER — SACUBITRIL-VALSARTAN 24-26 MG PO TABS
1.0000 | ORAL_TABLET | Freq: Two times a day (BID) | ORAL | Status: DC
  Administered 2019-08-14 – 2019-08-17 (×6): 1 via ORAL
  Filled 2019-08-14 (×6): qty 1

## 2019-08-14 MED ORDER — FUROSEMIDE 10 MG/ML IJ SOLN
40.0000 mg | Freq: Three times a day (TID) | INTRAMUSCULAR | Status: DC
  Administered 2019-08-14: 40 mg via INTRAVENOUS
  Filled 2019-08-14 (×2): qty 4

## 2019-08-14 MED ORDER — METHYLPREDNISOLONE SODIUM SUCC 40 MG IJ SOLR
40.0000 mg | Freq: Two times a day (BID) | INTRAMUSCULAR | Status: DC
  Administered 2019-08-14 – 2019-08-17 (×6): 40 mg via INTRAVENOUS
  Filled 2019-08-14 (×6): qty 1

## 2019-08-14 MED ORDER — FUROSEMIDE 10 MG/ML IJ SOLN
80.0000 mg | Freq: Two times a day (BID) | INTRAMUSCULAR | Status: DC
  Administered 2019-08-14: 80 mg via INTRAVENOUS
  Filled 2019-08-14: qty 8

## 2019-08-14 NOTE — Progress Notes (Signed)
PROGRESS NOTE                                                                                                                                                                                                             Patient Demographics:    Ricky Hayden, is a 74 y.o. male, DOB - 1945/11/16, AOZ:[id number]  Admit date - 08/12/2019   Admitting Physician Lavina Hamman, MD  Outpatient Primary MD for the patient is Patient, No Pcp Per  LOS - 2   Chief Complaint  Patient presents with  . Shortness of Breath       Brief Narrative    74 y.o. male with Past medical history of chronic combined CHF, CAD, BPH, GERD, gout, hiatal hernia, HTN, COPD, CKD 3b.  Patient presents to ED secondary to complaints of generalized weakness, low blood pressure, as well as shortness of breath, patient was found to have low blood pressure in urgent care, where he was transferred to [facility] where he was noted to have COVID-19 positive, with evidence of COVID-19 pneumonia on imaging and hypoxia with oxygen requirement, he was admitted for further management.   Subjective:    Ricky Hayden today reports cough, dyspnea with exertion, denies any chest pain or fever .   Assessment  & Plan :    Principal Problem:   Acute hypoxemic respiratory failure due to COVID-19 [place]) Active Problems:   Hyperlipidemia   Gout   Essential hypertension   GERD   BPH (benign prostatic hyperplasia)   Traumatic brain injury ([place])   Leg swelling   Chronic combined systolic and diastolic CHF, NYHA class 2 (HCC)   CKD (chronic kidney disease) stage 3, GFR 30-59 ml/min   COPD with acute exacerbation (HCC)  Acute hypoxic respiratory failure due to COVID-19 pneumonia -Patient with known underlying COPD, but no oxygen requirement at baseline, he was noted to be hypoxic in ED requiring some oxygen, chest x-ray significant for multifocal pneumonia COVID-19. -Continue with IV steroids, will  change Decadron to IV Solu-Medrol  given decreased air entry bilaterally and underlying history of COPD. -continue With IV remdesivir. -So far no indication for Actemra given low CRP and low oxygen requirement. -It was encouraged use incentive spirometry, flutter valve, to get out of bed to chair.   [place]  08/12/19 1543 08/13/19 0231 08/14/19 0252  DDIMER 0.55* 0.52* 0.55*  FERRITIN 99  --   --   LDH 209*  --   --   CRP 1.3* 1.0* 1.5*    Lab Results  Component Value Date   SARSCOV2NAA NOT DETECTED 10/21/2018   Chronic combined systolic and diastolic CHF. -Appears to be with some volume overload, and significant elevated BNP . -2D echo showing significant drop of EF from 45% to <20%, and significant for global hypokinesis . -Continue with IV diuresis, renal function seems to be improving with diuresis, will gram IV every 8 hours, monitor renal function closely. - Continue with Beta blocker. -Was seen by Dr. Caryl Comes recently, will consult [facility] for further management.  COPD -With some diminished air entry, have change decadron to IV solumedrol. -continue with home Spiriva and Symbicort  Chronic kidney disease stage IIIB -Function appears to be at baseline, continue to monitor closely as on diuresis  Essential hypertension - Patient is on Coreg 25 mg twice daily, clonidine 0.3 mg twice daily, Lasix 20 mg daily.  Continue with Coreg, hold clonidine for now given soft blood pressure and as needed diuresis.  Chronic lower extremity edema - Unna boots for leg swelling.  As well improving with diuresis  Traumatic brain injury. -Continue with supportive care History of seizure disorder. - On phenobarbital we will continue the same.  Hyperlipidemia. - Continue Pravachol.  BPH. - Continue Flomax and Proscar.  GERD. - Continuing PPI  Code Status : Full Code  Family Communication  : D/W daughter Ashly via phone 4/11, 4/12.  Disposition Plan  :  Home  Barriers For Discharge : Hypoxic, on IV steroids and remdesivir  Consults  :  None  Procedures  : None  DVT Prophylaxis  :  [place] heparin  Lab Results  Component Value Date   PLT 99 (L) 08/14/2019    Antibiotics  :    Anti-infectives (From admission, onward)   Start     Dose/Rate Route Frequency Ordered Stop   08/13/19 1000  remdesivir 100 mg in sodium chloride 0.9 % 100 mL IVPB     100 mg 200 mL/hr over 30 Minutes Intravenous Daily 08/12/19 1643 08/17/19 0959   08/12/19 1645  remdesivir 200 mg in sodium chloride 0.9% 250 mL IVPB     200 mg 580 mL/hr over 30 Minutes Intravenous Once 08/12/19 1643 08/12/19 1906        Objective:   Vitals:   08/13/19 2326 08/14/19 0424 08/14/19 0734 08/14/19 0943  BP: 131/84 119/88 (!) 148/113 (!) 139/99  Pulse: 85 89 91 93  Resp: (!) 27 (!) 25 14 (!) 28  Temp: 98.9 F (37.2 C) 98.4 F (36.9 C) 98 F (36.7 C)   TempSrc: Oral Oral Oral   SpO2: 92% 93% 95% (!) 86%  Weight:      Height:        Wt Readings from Last 3 Encounters:  08/12/19 113.9 kg  08/10/19 113.9 kg  10/22/18 108.8 kg     Intake/Output Summary (Last 24 hours) at 08/14/2019 0950 Last data filed at 08/14/2019 0900 Gross per 24 hour  Intake 780 ml  Output 600 ml  Net 180 ml     Physical Exam  Awake Alert, Oriented X 3, No new F.N deficits, Normal affect Symmetrical Chest wall movement, Good air movement bilaterally, CTAB RRR,No Gallops,Rubs or new Murmurs, No Parasternal Heave +ve B.Sounds, Abd Soft, No tenderness, No rebound - guarding or rigidity. No Cyanosis,  Clubbing ,+2 edema(improving) (unna boot) No new Rash or bruise     Data Review:    CBC Recent Labs  Lab 08/12/19 1543 08/13/19 0231 08/14/19 0252  WBC 4.6 4.0 4.3  HGB 15.7 15.1 15.4  HCT 48.3 48.3 49.6  PLT 111* 101* 99*  MCV 94.7 95.3 95.9  MCH 30.8 29.8 29.8  MCHC 32.5 31.3 31.0  RDW 14.2 14.2 13.9  LYMPHSABS 1.2 0.6* 0.5*  MONOABS 0.9 0.3 0.2  EOSABS 0.0 0.0 0.0  BASOSABS  0.0 0.0 0.0    Chemistries  Recent Labs  Lab 08/12/19 1543 08/13/19 0231 08/14/19 0252  NA 141 145 142  K 3.6 3.7 4.0  CL 98 98 95*  CO2 32 36* 37*  GLUCOSE 126* 163* 159*  BUN 40* 35* 30*  CREATININE 1.64* 1.61* 1.30*  CALCIUM 8.6* 8.7* 8.8*  MG  --  2.1 2.2  AST 35 32 35  ALT 35 33 38  ALKPHOS 62 67 65  BILITOT 0.7 0.6 0.7   ------------------------------------------------------------------------------------------------------------------ Recent Labs    08/12/19 1543  TRIG 186*    Lab Results  Component Value Date   HGBA1C 5.4 10/05/2012   ------------------------------------------------------------------------------------------------------------------ No results for input(s): TSH, T4TOTAL, T3FREE, THYROIDAB in the last 72 hours.  Invalid input(s): FREET3 ------------------------------------------------------------------------------------------------------------------ Recent Labs    08/12/19 1543  FERRITIN 99    Coagulation profile No results for input(s): INR, PROTIME in the last 168 hours.  Recent Labs    08/13/19 0231 08/14/19 0252  DDIMER 0.52* 0.55*    Cardiac Enzymes No results for input(s): CKMB, TROPONINI, MYOGLOBIN in the last 168 hours.  Invalid input(s): CK ------------------------------------------------------------------------------------------------------------------    Component Value Date/Time   BNP 1,462.8 (H) 08/14/2019 0252    Inpatient Medications  Scheduled Meds: . vitamin C  500 mg Oral Daily  . carvedilol  6.25 mg Oral BID WC  . finasteride  5 mg Oral Daily  . furosemide  40 mg Intravenous TID  . heparin  5,000 Units Subcutaneous Q8H  . insulin aspart  0-5 Units Subcutaneous QHS  . insulin aspart  0-9 Units Subcutaneous TID WC  . Ipratropium-Albuterol  1 puff Inhalation QID  . methylPREDNISolone (SOLU-MEDROL) injection  40 mg Intravenous Q8H  . mometasone-formoterol  2 puff Inhalation BID  . pantoprazole  40 mg Oral  Daily  . PHENobarbital  64.8 mg Oral BID  . pravastatin  40 mg Oral QPM  . sodium chloride  1-2 spray Each Nare Q2H  . tamsulosin  0.4 mg Oral Daily  . umeclidinium bromide  18 puff Inhalation Daily  . zinc sulfate  220 mg Oral Daily   Continuous Infusions: . remdesivir 100 mg in NS 100 mL 100 mg (08/14/19 0826)   PRN Meds:.acetaminophen, guaiFENesin-dextromethorphan, ondansetron **OR** ondansetron (ZOFRAN) IV  Micro Results Recent Results (from the past 240 hour(s))  Blood Culture (routine x 2)     Status: None (Preliminary result)   Collection Time: 08/12/19  4:56 PM   Specimen: BLOOD  Result Value Ref Range Status   Specimen Description BLOOD SITE NOT SPECIFIED  Final   Special Requests   Final    BOTTLES DRAWN AEROBIC AND ANAEROBIC Blood Culture adequate volume   Culture   Final    NO GROWTH 2 DAYS Performed at [facility], [address] [place], [place] [address]    Report Status PENDING  Incomplete  Blood Culture (routine x 2)     Status: None (Preliminary result)   Collection Time:  08/12/19  5:02 PM   Specimen: BLOOD  Result Value Ref Range Status   Specimen Description BLOOD LEFT ANTECUBITAL  Final   Special Requests   Final    BOTTLES DRAWN AEROBIC AND ANAEROBIC Blood Culture adequate volume   Culture   Final    NO GROWTH 2 DAYS Performed at [facility], [place] [address] [place], [place] [address]    Report Status PENDING  Incomplete    Radiology Reports DG Chest Port 1 View  Result Date: 08/12/2019 CLINICAL DATA:  Shortness of breath, COVID positive EXAM: PORTABLE CHEST 1 VIEW COMPARISON:  06/29/2018 FINDINGS: Mild left basilar opacity, likely atelectasis. Lungs otherwise clear in this patient with known COVID. No pleural effusion or pneumothorax. Cardiomegaly.  Mild thoracic aortic atherosclerosis. IMPRESSION: Mild left basilar opacity, likely atelectasis. Lungs otherwise clear in this patient with known COVID. Cardiomegaly.  Thoracic aortic  atherosclerosis. Electronically Signed   By: Julian Hy M.D.   On: 08/12/2019 16:08   ECHOCARDIOGRAM COMPLETE  Result Date: 08/13/2019    ECHOCARDIOGRAM REPORT   Patient Name:   JONTRELL BUSHONG Date of Exam: 08/13/2019 Medical Rec #:  [id number]     Height:       68.0 in Accession #:    [id number]    Weight:       251.0 lb Date of Birth:  [birth year]    BSA:          2.251 m Patient Age:    85 years      BP:           118/87 mmHg Patient Gender: M             HR:           88 bpm. Exam Location:  Inpatient Procedure: 2D Echo, Cardiac Doppler, Color Doppler and Intracardiac            Opacification Agent Indications:    I50.9* Heart failure (unspecified)  History:        Patient has prior history of Echocardiogram examinations, most                 recent 04/15/2015. CHF, CAD, COPD; Risk Factors:Hypertension and                 Dyslipidemia. Covid 19 positive. Hypoxia.  Sonographer:    Roseanna Rainbow RDCS Referring Phys: [id number] [place]  Sonographer Comments: Technically difficult study due to poor echo windows. Image acquisition challenging due to patient body habitus. IMPRESSIONS  1. Left ventricular ejection fraction, by estimation, is <20%. The left ventricle has severely decreased function. The left ventricle demonstrates global hypokinesis. The left ventricular internal cavity size was mildly dilated. There is mild concentric  left ventricular hypertrophy. Left ventricular diastolic function could not be evaluated. Elevated left ventricular end-diastolic pressure.  2. Right ventricular systolic function is severely reduced. The right ventricular size is severely enlarged. There is moderately elevated pulmonary artery systolic pressure.  3. The mitral valve is normal in structure. Mild mitral valve regurgitation. No evidence of mitral stenosis.  4. The aortic valve is tricuspid. Aortic valve regurgitation is trivial. Mild to moderate aortic valve sclerosis/calcification is present, without any evidence  of aortic stenosis.  5. Aortic dilatation noted. There is mild dilatation of the aortic root and of the ascending aorta measuring 41 mm and 75mm respectively.  6. The inferior vena cava is dilated in size with <50% respiratory variability, suggesting right atrial pressure of 15 mmHg.  7. Left atrial size was severely dilated.  8. Right atrial size was mildly dilated. FINDINGS  Left Ventricle: Left ventricular ejection fraction, by estimation, is <20%. The left ventricle has severely decreased function. The left ventricle demonstrates global hypokinesis. The left ventricular internal cavity size was mildly dilated. There is mild concentric left ventricular hypertrophy. Abnormal (paradoxical) septal motion, consistent with left bundle branch block. Left ventricular diastolic function could not be evaluated. Elevated left ventricular end-diastolic pressure. Right Ventricle: The right ventricular size is severely enlarged. No increase in right ventricular wall thickness. Right ventricular systolic function is severely reduced. There is moderately elevated pulmonary artery systolic pressure. The tricuspid regurgitant velocity is 3.01 m/s, and with an assumed right atrial pressure of 15 mmHg, the estimated right ventricular systolic pressure is 54.0 mmHg. Left Atrium: Left atrial size was severely dilated. Right Atrium: Right atrial size was mildly dilated. Pericardium: There is no evidence of pericardial effusion. Mitral Valve: The mitral valve is normal in structure. Normal mobility of the mitral valve leaflets. Moderate mitral annular calcification. Mild mitral valve regurgitation. No evidence of mitral valve stenosis. Tricuspid Valve: The tricuspid valve is normal in structure. Tricuspid valve regurgitation is mild . No evidence of tricuspid stenosis. Aortic Valve: The aortic valve is tricuspid. Aortic valve regurgitation is trivial. Mild to moderate aortic valve sclerosis/calcification is present, without any  evidence of aortic stenosis. Mild to moderate aortic valve annular calcification. Pulmonic Valve: The pulmonic valve was normal in structure. Pulmonic valve regurgitation is mild. No evidence of pulmonic stenosis. Aorta: The aortic root is normal in size and structure and aortic dilatation noted. 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 Venous: The inferior vena cava is dilated in size with less than 50% respiratory variability, suggesting right atrial pressure of 15 mmHg. IAS/Shunts: No atrial level shunt detected by color flow Doppler.  LEFT VENTRICLE PLAX 2D LVIDd:         6.10 cm      Diastology LVIDs:         5.70 cm      LV e' lateral:   4.79 cm/s LV PW:         1.70 cm      LV E/e' lateral: 18.2 LV IVS:        1.20 cm      LV e' medial:    3.26 cm/s LVOT diam:     2.30 cm      LV E/e' medial:  26.8 LV SV:         38 LV SV Index:   17 LVOT Area:     4.15 cm  LV Volumes (MOD) LV vol d, MOD A2C: 216.0 ml LV vol d, MOD A4C: 186.0 ml LV vol s, MOD A2C: 188.0 ml LV vol s, MOD A4C: 173.0 ml LV SV MOD A2C:     28.0 ml LV SV MOD A4C:     186.0 ml LV SV MOD BP:      18.8 ml RIGHT VENTRICLE            IVC RV S prime:     7.07 cm/s  IVC diam: 2.80 cm TAPSE (M-mode): 1.1 cm LEFT ATRIUM              Index       RIGHT ATRIUM           Index LA diam:        5.00 cm  2.22 cm/m  RA Area:     24.20 cm LA Vol (A2C):   113.0 ml 50.20 ml/m RA Volume:   83.40 ml  37.05 ml/m LA Vol (A4C):   112.0 ml 49.76 ml/m LA Biplane Vol: 114.0 ml 50.65 ml/m  AORTIC VALVE             PULMONIC VALVE LVOT Vmax:   69.80 cm/s  PV Vmax:          2.25 m/s LVOT Vmean:  49.900 cm/s PV Peak grad:     20.2 mmHg LVOT VTI:    0.092 m     PR End Diast Vel: 2.31 msec  AORTA Ao Root diam: 4.10 cm Ao Asc diam:  4.10 cm MITRAL VALVE               TRICUSPID VALVE MV Area (PHT): 3.17 cm    TR Peak grad:   36.2 mmHg MV Decel Time: 239 msec    TR Vmax:        301.00 cm/s MV E velocity: 87.40 cm/s                             SHUNTS                            Systemic VTI:  0.09 m                            Systemic Diam: 2.30 cm Fransico Him MD Electronically signed by Fransico Him MD Signature Date/Time: 08/13/2019/3:03:36 PM    Final       Phillips Climes M.D on 08/14/2019 at 9:50 AM   After 7pm go to [email or web address] - password [facility]  [facility] -  Office  [phone]

## 2019-08-14 NOTE — Consult Note (Addendum)
Cardiology Consultation:   Due to the COVID-19 pandemic, this visit was completed with telemedicine (audio/video) technology to reduce patient and provider exposure as well as to preserve personal protective equipment.   Patient ID: Ricky Hayden MRN: [id number]; DOB: [birth year]  Admit date: 08/12/2019 Date of Consult: 08/14/2019  Primary Care Provider: Patient, No Pcp Per Primary Cardiologist: No primary care provider on file. Previously managed at the [facility], pending outpatient referral to [facility] Primary Electrophysiologist:  Virl Axe, MD  (recently established via telemed 08/10/19)   Patient Profile:   Ricky Hayden is a 74 y.o. 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 Rubye Beach  History of Present Illness:   [facility] under [facility], ever since his brain issues in 1995 he has had fatigue and decreased exercise tolerance. Today recalls being told he had some sort of blockage in the lower part of his heart back then. He remembers undergoing a cath through his groin and being told he had something but it wasn't bad enough to do anything about. He denies prior stenting/CABG or valve issues. His dad died of an MI at age 75. He drank significant ETOH in the past but quit 10 years ago. He declined sleep study. 2D echo in 2016 in our system showed EF 45-50% and then 30% from 2017 onward at the [place]. He has not had a repeat cath or ischemic evaluation to his knowledge. Last echo 02/2019 was technically difficult, LVEF 30-35%, moderate global HK, diastolic function  not well evaluated due to ectopy, mild LVH, moderate LAE, mild aortic regurgitation, mild-moderate mitral regurgitation, mild tricuspid regurgitation, mild aortic root dilatation (4.2cm). Losartan previously had to be stopped because of hyperkalemia.  He was seen by EP at the [place] recently 08/03/19 who suggested ICD implantation. The patient wanted to have it done at [facility] so was sent to Dr. Caryl Comes. He saw Dr. Caryl Comes via telemedicine on 08/10/19 at which time he reported increasing dyspnea and chest discomfort relieved by rest after minutes as well as 3-pillow orthopnea and PND. This has been going on for about a year per the patient. Dr. Caryl Comes was concerned that he had not had a prior cardiac catheterization, his volume overload and assessing his PVC burden with outpatient monitoring. His Lasix was increased to 80mg  daily x 3 days with plan for close follow-up with the CHF clinic to establish care. The patient's wife was admitted to [facility] with Covid. Subsequent to this the patient developed symptoms and tested positive at urgent care. He was sent to the hospital 08/12/19 with increased fatigue, malaise, and worsening dyspnea on exertion. He was found to be hypoxic 82-92% on RA requiring supplemental O2 and hypotension with SBP in the 70s. He has not been vaccinated for Covid as he was skeptical to receive it. Labs also show elevated CRP, LDH, thrombocytopenia with plt count down to 99, Cr 1.64->1.30, d-dimer 0.55, BNP 1462. CXR showed mild left basilar opacity, cardiomegaly, thoracic aortic atherosclerosis, lungs otherwise clear in patient with known Covid. 2D echo yesterday here showed EF <20%, severely reduced function with global hypokinesis, mildly dilated LV  with mild LVH, elevated LVEDP, severely reduced RV function, moderately elevated PASP, mild MR, mild dilation of aortic root and ascending aorta, severe LAE, mild RAE. He is currently not dyspneic at rest, but continues to feel the above symptoms with  exertion.    Past Medical History:  Diagnosis Date  . Arthritis    osteoarthritis of left knee  . Ascending aorta dilatation (HCC)   . Balanitis    recurrent  . Cardiac arrhythmia    life threatening, secondary to CCB vs b- blockers  . Cardiomyopathy ([place])   . Chronic joint pain   . Chronic systolic CHF (congestive heart failure) ([place])   . CKD (chronic kidney disease), stage III   . COPD (chronic obstructive pulmonary disease) ([place])   . Coronary artery disease   . Dilated aortic root ([place])   . Enlarged prostate   . 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 GERD (gastroesophageal reflux disease)   . Gout   . Hiatal hernia   . Hyperlipidemia   . Hypertension   . Intracranial hematoma ([place]) 1995   history of, s/p evacuation by Dr. Sherwood Gambler  . Nocturia   . Obesity   . Pansinusitis    a.  complicated by brain abscess and bleeding requiring craniotomy in 1995.  Marland Kitchen PONV (postoperative nausea and vomiting)   . Sinusitis    s/p ethmoidectomy and nasal septoplasty  . Vertigo    intermitantly    Past Surgical History:  Procedure Laterality Date  . CIRCUMCISION N/A 11/20/2013   Procedure: CIRCUMCISION ADULT;  Surgeon: Claybon Jabs, MD;  Location: [facility];  Service: Urology;  Laterality: N/A;  . [place]   hematomy due to sinus infection   . shoulder surg rt   1995  . SINUS SURGERY WITH INSTATRAK     ethmoidectomy and nasal septum repair     Home Medications:  Prior to Admission medications   Medication Sig Start Date End Date Taking? Authorizing Provider  albuterol (PROVENTIL HFA;VENTOLIN HFA) 108 (90 BASE) MCG/ACT inhaler Inhale 2 puffs into the lungs every 6 (six) hours as needed for wheezing. 12/10/14  Yes Maryellen Pile, MD  allopurinol (ZYLOPRIM) 100 MG tablet Take 2 tablets (200 mg total) by mouth daily. 12/10/14 08/12/19 Yes Maryellen Pile, MD  budesonide-formoterol (SYMBICORT) 80-4.5 MCG/ACT inhaler Inhale 2 puffs into the lungs 2 (two) times  daily. 09/17/15  Yes Corky Sox, MD  carvedilol (COREG) 25 MG tablet TAKE ONE TABLET BY MOUTH TWICE DAILY WITH MEALS Patient taking differently: Take 25 mg by mouth 2 (two) times daily with a meal.  11/13/15  Yes Maryellen Pile, MD  cloNIDine (CATAPRES) 0.3 MG tablet Take 1 tablet (0.3 mg total) by mouth 2 (two) times daily. 03/05/15  Yes Maryellen Pile, MD  diclofenac Sodium (VOLTAREN) 1 % GEL Apply 2 g topically daily as needed (pain).   Yes [provider]  docusate sodium (COLACE) 100 MG capsule Take 1-2 capsules (100-200 mg total) by mouth 2 (two) times daily as needed for mild constipation. 03/05/14  Yes Kelby Aline, MD  finasteride (PROSCAR) 5 MG tablet TAKE ONE TABLET BY MOUTH ONCE DAILY Patient taking differently: Take 5 mg by mouth daily.  03/05/15  Yes Maryellen Pile, MD  furosemide (LASIX) 20 MG tablet TAKE ONE TABLET BY MOUTH ONCE DAILY Patient taking differently: Take 40 mg by mouth daily.  12/27/15  Yes Maryellen Pile, MD  Multiple Vitamins-Minerals (ICAPS AREDS 2 PO) Take 2 capsules  by mouth daily.   Yes [provider]  omeprazole (PRILOSEC) 20 MG capsule Take 1 capsule (20 mg total) by mouth daily. 12/10/14  Yes Maryellen Pile, MD  PHENobarbital (LUMINAL) 64.8 MG tablet Take 1 tablet (64.8 mg total) by mouth 2 (two) times daily. 07/24/15  Yes Corky Sox, MD  polyethylene glycol [facility] Floria Raveling) packet Take 17 g by mouth daily as needed for moderate constipation. 12/10/14  Yes Maryellen Pile, MD  pravastatin (PRAVACHOL) 40 MG tablet Take 1 tablet (40 mg total) by mouth every evening. 12/10/14  Yes Maryellen Pile, MD  SPIRIVA HANDIHALER 18 MCG inhalation capsule INHALE ONE DOSE BY MOUTH ONCE DAILY Patient taking differently: Place 18 mcg into inhaler and inhale daily.  09/27/15  Yes Maryellen Pile, MD  tamsulosin (FLOMAX) 0.4 MG CAPS capsule Take 1 capsule (0.4 mg total) by mouth daily. 03/05/15  Yes Maryellen Pile, MD  torsemide (DEMADEX) 20 MG tablet Take  40 mg by mouth 2 (two) times daily.   Yes [provider]    Inpatient Medications: Scheduled Meds: . vitamin C  500 mg Oral Daily  . carvedilol  6.25 mg Oral BID WC  . finasteride  5 mg Oral Daily  . furosemide  40 mg Intravenous TID  . heparin  5,000 Units Subcutaneous Q8H  . insulin aspart  0-5 Units Subcutaneous QHS  . insulin aspart  0-9 Units Subcutaneous TID WC  . Ipratropium-Albuterol  1 puff Inhalation QID  . methylPREDNISolone (SOLU-MEDROL) injection  40 mg Intravenous Q12H  . mometasone-formoterol  2 puff Inhalation BID  . pantoprazole  40 mg Oral Daily  . PHENobarbital  64.8 mg Oral BID  . pravastatin  40 mg Oral QPM  . sodium chloride  1-2 spray Each Nare Q2H  . tamsulosin  0.4 mg Oral Daily  . umeclidinium bromide  18 puff Inhalation Daily  . zinc sulfate  220 mg Oral Daily   Continuous Infusions: . remdesivir 100 mg in NS 100 mL 100 mg (08/14/19 0826)   PRN Meds: acetaminophen, guaiFENesin-dextromethorphan, ondansetron **OR** ondansetron (ZOFRAN) IV  Allergies:    Allergies  Allergen Reactions  . Calcium Channel Blockers Other (See Comments)    Came to hospital in 1995-caused chest pain     Social History:   Social History   Socioeconomic History  . Marital status: Married    Spouse name: Not on file  . Number of children: Not on file  . Years of education: Not on file  . Highest education level: Not on file  Occupational History  . Not on file  Tobacco Use  . Smoking status: Former Smoker    Quit date: 05/04/1986    Years since quitting: 33.3  . Smokeless tobacco: Never Used  Substance and Sexual Activity  . Alcohol use: No    Alcohol/week: 0.0 standard drinks  . Drug use: No  . Sexual activity: Yes  Other Topics Concern  . Not on file  Social History Narrative  . Not on file   Social Determinants of Health   Financial Resource Strain:   . Difficulty of Paying Living Expenses:   Food Insecurity:   . Worried About Ship broker in the Last Year:   . Arboriculturist in the Last Year:   Transportation Needs:   . Film/video editor (Medical):   Marland Kitchen Lack of Transportation (Non-Medical):   Physical Activity:   . Days of Exercise per Week:   . Minutes of Exercise  per Session:   Stress:   . Feeling of Stress :   Social Connections:   . Frequency of Communication with Friends and Family:   . Frequency of Social Gatherings with Friends and Family:   . Attends Religious Services:   . Active Member of Clubs or Organizations:   . Attends Archivist Meetings:   Marland Kitchen Marital Status:   Intimate Partner Violence:   . Fear of Current or Ex-Partner:   . Emotionally Abused:   Marland Kitchen Physically Abused:   . Sexually Abused:     Family History:   Family History  Problem Relation Age of Onset  . Heart failure Mother 77  . Heart attack Father 66     ROS:  Please see the history of present illness.  All other ROS reviewed and negative.     Physical Exam/Data:   Vitals:   08/14/19 0424 08/14/19 0734 08/14/19 0943 08/14/19 1057  BP: 119/88 (!) 148/113 (!) 139/99   Pulse: 89 91 93   Resp: (!) 25 14 (!) 28   Temp: 98.4 F (36.9 C) 98 F (36.7 C)    TempSrc: Oral Oral    SpO2: 93% 95% 90% 91%  Weight:      Height:        Intake/Output Summary (Last 24 hours) at 08/14/2019 1552 Last data filed at 08/14/2019 0900 Gross per 24 hour  Intake 320 ml  Output --  Net 320 ml   Last 3 Weights 08/12/2019 08/10/2019 10/22/2018  Weight (lbs) 251 lb 251 lb 239 lb 12.8 oz  Weight (kg) 113.853 kg 113.853 kg 108.773 kg     Body mass index is 38.16 kg/m.   VITAL SIGNS:  reviewed   General - calm male in no acute distress Neck: JVD up to earlobes  Ext: 2+ pitting edema by nursing Pulm - No labored breathing, sporadic coughing noted, no audible wheezing, speaking in full sentences Neuro - A+Ox3, no slurred speech, answers questions appropriately Psych - Pleasant affect  EKG:  The EKG was personally reviewed and  demonstrates:  NSR 75bpm, prolonged PR interval, LBBB with QRS duration 176ms, prolonged QT at 549ms, nonspecific TWI changes I, avL   Telemetry:  Telemetry was personally reviewed and demonstrates: NSR LBBB with occasional PVCs - QTc now closer to 454ms  Relevant CV Studies: 1. Left ventricular ejection fraction, by estimation, is <20%. The left  ventricle has severely decreased function. The left ventricle demonstrates  global hypokinesis. The left ventricular internal cavity size was mildly  dilated. There is mild concentric  left ventricular hypertrophy. Left ventricular diastolic function could  not be evaluated. Elevated left ventricular end-diastolic pressure.  2. Right ventricular systolic function is severely reduced. The right  ventricular size is severely enlarged. There is moderately elevated  pulmonary artery systolic pressure.  3. The mitral valve is normal in structure. Mild mitral valve  regurgitation. No evidence of mitral stenosis.  4. The aortic valve is tricuspid. Aortic valve regurgitation is trivial.  Mild to moderate aortic valve sclerosis/calcification is present, without  any evidence of aortic stenosis.  5. Aortic dilatation noted. There is mild dilatation of the aortic root  and of the ascending aorta measuring 41 mm and 53mm respectively.  6. The inferior vena cava is dilated in size with <50% respiratory  variability, suggesting right atrial pressure of 15 mmHg.  7. Left atrial size was severely dilated.  8. Right atrial size was mildly dilated.   Laboratory Data:  Chemistry Recent Labs  Lab 08/12/19 1543 08/13/19 0231 08/14/19 0252  NA 141 145 142  K 3.6 3.7 4.0  CL 98 98 95*  CO2 32 36* 37*  GLUCOSE 126* 163* 159*  BUN 40* 35* 30*  CREATININE 1.64* 1.61* 1.30*  CALCIUM 8.6* 8.7* 8.8*  GFRNONAA 40* 41* 53*  GFRAA 47* 48* >60  ANIONGAP 11 11 10     Recent Labs  Lab 08/12/19 1543 08/13/19 0231 08/14/19 0252  PROT 6.1* 6.4* 6.6   ALBUMIN 3.1* 3.1* 3.2*  AST 35 32 35  ALT 35 33 38  ALKPHOS 62 67 65  BILITOT 0.7 0.6 0.7   Hematology Recent Labs  Lab 08/12/19 1543 08/13/19 0231 08/14/19 0252  WBC 4.6 4.0 4.3  RBC 5.10 5.07 5.17  HGB 15.7 15.1 15.4  HCT 48.3 48.3 49.6  MCV 94.7 95.3 95.9  MCH 30.8 29.8 29.8  MCHC 32.5 31.3 31.0  RDW 14.2 14.2 13.9  PLT 111* 101* 99*   Cardiac EnzymesNo results for input(s): TROPONINI in the last 168 hours. No results for input(s): TROPIPOC in the last 168 hours.  BNP Recent Labs  Lab 08/14/19 0252  BNP 1,462.8*    DDimer  Recent Labs  Lab 08/12/19 1543 08/13/19 0231 08/14/19 0252  DDIMER 0.55* 0.52* 0.55*    Radiology/Studies:  DG Chest Port 1 View  Result Date: 08/12/2019 CLINICAL DATA:  Shortness of breath, COVID positive EXAM: PORTABLE CHEST 1 VIEW COMPARISON:  06/29/2018 FINDINGS: Mild left basilar opacity, likely atelectasis. Lungs otherwise clear in this patient with known COVID. No pleural effusion or pneumothorax. Cardiomegaly.  Mild thoracic aortic atherosclerosis. IMPRESSION: Mild left basilar opacity, likely atelectasis. Lungs otherwise clear in this patient with known COVID. Cardiomegaly.  Thoracic aortic atherosclerosis. Electronically Signed   By: Julian Hy M.D.   On: 08/12/2019 16:08   ECHOCARDIOGRAM COMPLETE  Result Date: 08/13/2019    ECHOCARDIOGRAM REPORT   Patient Name:   Ricky Hayden Date of Exam: 08/13/2019 Medical Rec #:  [id number]     Height:       68.0 in Accession #:    [id number]    Weight:       251.0 lb Date of Birth:  November 29, 1945    BSA:          2.251 m Patient Age:    57 years      BP:           118/87 mmHg Patient Gender: M             HR:           88 bpm. Exam Location:  Inpatient Procedure: 2D Echo, Cardiac Doppler, Color Doppler and Intracardiac            Opacification Agent Indications:    I50.9* Heart failure (unspecified)  History:        Patient has prior history of Echocardiogram examinations, most                  recent 04/15/2015. CHF, CAD, COPD; Risk Factors:Hypertension and                 Dyslipidemia. Covid 19 positive. Hypoxia.  Sonographer:    Roseanna Rainbow RDCS Referring Phys: [id number] [place]  Sonographer Comments: Technically difficult study due to poor echo windows. Image acquisition challenging due to patient body habitus. IMPRESSIONS  1. Left ventricular ejection fraction, by estimation, is <20%. The left ventricle has severely decreased function. The left ventricle demonstrates global hypokinesis. The left  ventricular internal cavity size was mildly dilated. There is mild concentric  left ventricular hypertrophy. Left ventricular diastolic function could not be evaluated. Elevated left ventricular end-diastolic pressure.  2. Right ventricular systolic function is severely reduced. The right ventricular size is severely enlarged. There is moderately elevated pulmonary artery systolic pressure.  3. The mitral valve is normal in structure. Mild mitral valve regurgitation. No evidence of mitral stenosis.  4. The aortic valve is tricuspid. Aortic valve regurgitation is trivial. Mild to moderate aortic valve sclerosis/calcification is present, without any evidence of aortic stenosis.  5. Aortic dilatation noted. There is mild dilatation of the aortic root and of the ascending aorta measuring 41 mm and 43mm respectively.  6. The inferior vena cava is dilated in size with <50% respiratory variability, suggesting right atrial pressure of 15 mmHg.  7. Left atrial size was severely dilated.  8. Right atrial size was mildly dilated. FINDINGS  Left Ventricle: Left ventricular ejection fraction, by estimation, is <20%. The left ventricle has severely decreased function. The left ventricle demonstrates global hypokinesis. The left ventricular internal cavity size was mildly dilated. There is mild concentric left ventricular hypertrophy. Abnormal (paradoxical) septal motion, consistent with left bundle branch block. Left  ventricular diastolic function could not be evaluated. Elevated left ventricular end-diastolic pressure. Right Ventricle: The right ventricular size is severely enlarged. No increase in right ventricular wall thickness. Right ventricular systolic function is severely reduced. There is moderately elevated pulmonary artery systolic pressure. The tricuspid regurgitant velocity is 3.01 m/s, and with an assumed right atrial pressure of 15 mmHg, the estimated right ventricular systolic pressure is 61.4 mmHg. Left Atrium: Left atrial size was severely dilated. Right Atrium: Right atrial size was mildly dilated. Pericardium: There is no evidence of pericardial effusion. Mitral Valve: The mitral valve is normal in structure. Normal mobility of the mitral valve leaflets. Moderate mitral annular calcification. Mild mitral valve regurgitation. No evidence of mitral valve stenosis. Tricuspid Valve: The tricuspid valve is normal in structure. Tricuspid valve regurgitation is mild . No evidence of tricuspid stenosis. Aortic Valve: The aortic valve is tricuspid. Aortic valve regurgitation is trivial. Mild to moderate aortic valve sclerosis/calcification is present, without any evidence of aortic stenosis. Mild to moderate aortic valve annular calcification. Pulmonic Valve: The pulmonic valve was normal in structure. Pulmonic valve regurgitation is mild. No evidence of pulmonic stenosis. Aorta: The aortic root is normal in size and structure and aortic dilatation noted. 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 Venous: The inferior vena cava is dilated in size with less than 50% respiratory variability, suggesting right atrial pressure of 15 mmHg. IAS/Shunts: No atrial level shunt detected by color flow Doppler.  LEFT VENTRICLE PLAX 2D LVIDd:         6.10 cm      Diastology LVIDs:         5.70 cm      LV e' lateral:   4.79 cm/s LV PW:         1.70 cm      LV E/e' lateral: 18.2 LV IVS:         1.20 cm      LV e' medial:    3.26 cm/s LVOT diam:     2.30 cm      LV E/e' medial:  26.8 LV SV:         38 LV SV Index:   17 LVOT Area:     4.15  cm  LV Volumes (MOD) LV vol d, MOD A2C: 216.0 ml LV vol d, MOD A4C: 186.0 ml LV vol s, MOD A2C: 188.0 ml LV vol s, MOD A4C: 173.0 ml LV SV MOD A2C:     28.0 ml LV SV MOD A4C:     186.0 ml LV SV MOD BP:      18.8 ml RIGHT VENTRICLE            IVC RV S prime:     7.07 cm/s  IVC diam: 2.80 cm TAPSE (M-mode): 1.1 cm LEFT ATRIUM              Index       RIGHT ATRIUM           Index LA diam:        5.00 cm  2.22 cm/m  RA Area:     24.20 cm LA Vol (A2C):   113.0 ml 50.20 ml/m RA Volume:   83.40 ml  37.05 ml/m LA Vol (A4C):   112.0 ml 49.76 ml/m LA Biplane Vol: 114.0 ml 50.65 ml/m  AORTIC VALVE             PULMONIC VALVE LVOT Vmax:   69.80 cm/s  PV Vmax:          2.25 m/s LVOT Vmean:  49.900 cm/s PV Peak grad:     20.2 mmHg LVOT VTI:    0.092 m     PR End Diast Vel: 2.31 msec  AORTA Ao Root diam: 4.10 cm Ao Asc diam:  4.10 cm MITRAL VALVE               TRICUSPID VALVE MV Area (PHT): 3.17 cm    TR Peak grad:   36.2 mmHg MV Decel Time: 239 msec    TR Vmax:        301.00 cm/s MV E velocity: 87.40 cm/s                            SHUNTS                            Systemic VTI:  0.09 m                            Systemic Diam: 2.30 cm Fransico Him MD Electronically signed by Fransico Him MD Signature Date/Time: 08/13/2019/3:03:36 PM    Final     Assessment and Plan:   1. Acute hypoxic respiratory failure -2/2 covid and acute decompensated systolic HF -continue current covid medications.   2. 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 LVOT VTI 9 cm. Concerning.  -grossly volume overloaded by virtual visit  -needs outpatient LHC and optimization but covid will delay this -for now, we need to diurese him. I will increase to 80 mg lasix IV BID -continue coreg 6.25 mg BID; careful monitoring  of BP -will try to get him on entresto 24-26 mg BID -I understand he had issues with hyper K in the past but we can closely monitor while in house -LBBB with QRS 170 ms. I agree with Dr. Caryl Comes needs medication optimization and LHC -if he cannot tolerate HF medications, we may need to consider advanced HF consult  3. HTN, with hypotension on admission -stop clonidine  -HF medications   4.  AKI on CKD stage III -will improve with diuresis   5. Hyperlipidemia  -check lipid profile in AM and consider switching to higher intensity statin given aortic atherosclerosis.   6. Prolonged QTc  -initially 570ms on EKG but now 423ms on telemetry. Mg, K wnl.  For questions or updates, please contact [place] Please consult [email or web address] for contact info under   [employer]. Audie Box, [place]  [address], [place], [place] [address] [phone]  4:35 PM

## 2019-08-14 NOTE — Progress Notes (Signed)
Patient wean down to 1L and was hanging 88-90% while awake, when sleeping he dropped to 84% moved o2 back up to 4L and pt is 91%. Will continue to monitor and tritiate back down once patient is awake. Patient OOB to chair and resting in the chair.

## 2019-08-15 DIAGNOSIS — I5021 Acute systolic (congestive) heart failure: Secondary | ICD-10-CM

## 2019-08-15 DIAGNOSIS — N400 Enlarged prostate without lower urinary tract symptoms: Secondary | ICD-10-CM | POA: Diagnosis not present

## 2019-08-15 DIAGNOSIS — I10 Essential (primary) hypertension: Secondary | ICD-10-CM | POA: Diagnosis not present

## 2019-08-15 LAB — COMPREHENSIVE METABOLIC PANEL
ALT: 39 U/L (ref 0–44)
AST: 34 U/L (ref 15–41)
Albumin: 3.1 g/dL — ABNORMAL LOW (ref 3.5–5.0)
Alkaline Phosphatase: 64 U/L (ref 38–126)
Anion gap: 9 (ref 5–15)
BUN: 35 mg/dL — ABNORMAL HIGH (ref 8–23)
CO2: 38 mmol/L — ABNORMAL HIGH (ref 22–32)
Calcium: 8.8 mg/dL — ABNORMAL LOW (ref 8.9–10.3)
Chloride: 95 mmol/L — ABNORMAL LOW (ref 98–111)
Creatinine, Ser: 1.3 mg/dL — ABNORMAL HIGH (ref 0.61–1.24)
GFR calc Af Amer: >60 mL/min (ref >60)
GFR calc non Af Amer: 53 mL/min — ABNORMAL LOW (ref >60)
Glucose, Bld: 165 mg/dL — ABNORMAL HIGH (ref 70–99)
Potassium: 3.6 mmol/L (ref 3.5–5.1)
Sodium: 142 mmol/L (ref 135–145)
Total Bilirubin: 0.7 mg/dL (ref 0.3–1.2)
Total Protein: 6.5 g/dL (ref 6.5–8.1)

## 2019-08-15 LAB — CBC WITH DIFFERENTIAL/PLATELET
Abs Immature Granulocytes: 0.05 10*3/uL (ref 0.00–0.07)
Basophils Absolute: 0 10*3/uL (ref 0.0–0.1)
Basophils Relative: 0 %
Eosinophils Absolute: 0 10*3/uL (ref 0.0–0.5)
Eosinophils Relative: 0 %
HCT: 50.3 % (ref 39.0–52.0)
Hemoglobin: 16 g/dL (ref 13.0–17.0)
Immature Granulocytes: 1 %
Lymphocytes Relative: 9 %
Lymphs Abs: 0.7 10*3/uL (ref 0.7–4.0)
MCH: 30.3 pg (ref 26.0–34.0)
MCHC: 31.8 g/dL (ref 30.0–36.0)
MCV: 95.3 fL (ref 80.0–100.0)
Monocytes Absolute: 0.9 10*3/uL (ref 0.1–1.0)
Monocytes Relative: 10 %
Neutro Abs: 6.6 10*3/uL (ref 1.7–7.7)
Neutrophils Relative %: 80 %
Platelets: 100 10*3/uL — ABNORMAL LOW (ref 150–400)
RBC: 5.28 MIL/uL (ref 4.22–5.81)
RDW: 13.7 % (ref 11.5–15.5)
WBC: 8.2 10*3/uL (ref 4.0–10.5)
nRBC: 0 % (ref 0.0–0.2)

## 2019-08-15 LAB — LIPID PANEL
Cholesterol: 134 mg/dL (ref 0–200)
HDL: 35 mg/dL — ABNORMAL LOW (ref >40)
LDL Cholesterol: 77 mg/dL (ref 0–99)
Total CHOL/HDL Ratio: 3.8 RATIO
Triglycerides: 110 mg/dL (ref <150)
VLDL: 22 mg/dL (ref 0–40)

## 2019-08-15 LAB — GLUCOSE, CAPILLARY
Glucose-Capillary: 142 mg/dL — ABNORMAL HIGH (ref 70–99)
Glucose-Capillary: 205 mg/dL — ABNORMAL HIGH (ref 70–99)
Glucose-Capillary: 155 mg/dL — ABNORMAL HIGH (ref 70–99)
Glucose-Capillary: 174 mg/dL — ABNORMAL HIGH (ref 70–99)

## 2019-08-15 LAB — C-REACTIVE PROTEIN: CRP: 1.4 mg/dL — ABNORMAL HIGH (ref <1.0)

## 2019-08-15 LAB — D-DIMER, QUANTITATIVE: D-Dimer, Quant: 0.49 ug/mL-FEU (ref 0.00–0.50)

## 2019-08-15 LAB — MAGNESIUM: Magnesium: 2.1 mg/dL (ref 1.7–2.4)

## 2019-08-15 MED ORDER — FUROSEMIDE 10 MG/ML IJ SOLN
160.0000 mg | Freq: Two times a day (BID) | INTRAVENOUS | Status: AC
  Administered 2019-08-15 (×2): 160 mg via INTRAVENOUS
  Filled 2019-08-15: qty 16
  Filled 2019-08-15: qty 10

## 2019-08-15 MED ORDER — POTASSIUM CHLORIDE CRYS ER 20 MEQ PO TBCR
40.0000 meq | EXTENDED_RELEASE_TABLET | Freq: Once | ORAL | Status: AC
  Administered 2019-08-15: 08:00:00 40 meq via ORAL
  Filled 2019-08-15: qty 2

## 2019-08-15 MED ORDER — SALINE SPRAY 0.65 % NA SOLN
1.0000 | NASAL | Status: DC | PRN
  Filled 2019-08-15: qty 44

## 2019-08-15 NOTE — Progress Notes (Signed)
PROGRESS NOTE                                                                                                                                                                                                             Patient Demographics:    Ricky Hayden, is a 74 y.o. male, DOB - 01-17-1946, STM:[id number]  Admit date - 08/12/2019   Admitting Physician Lavina Hamman, MD  Outpatient Primary MD for the patient is Patient, No Pcp Per  LOS - 3   Chief Complaint  Patient presents with  . Shortness of Breath       Brief Narrative    74 y.o. male with Past medical history of chronic combined CHF, CAD, BPH, GERD, gout, hiatal hernia, HTN, COPD, CKD 3b.  Patient presents to ED secondary to complaints of generalized weakness, low blood pressure, as well as shortness of breath, patient was found to have low blood pressure in urgent care, where he was transferred to [facility] where he was noted to have COVID-19 positive, with evidence of COVID-19 pneumonia on imaging and hypoxia with oxygen requirement, he was admitted for further management.   Subjective:    Ricky Hayden today cough, dyspnea with exertion, denies any chest pain or fever .   Assessment  & Plan :    Principal Problem:   Acute hypoxemic respiratory failure due to COVID-19 [facility]) Active Problems:   Hyperlipidemia   Gout   Essential hypertension   GERD   BPH (benign prostatic hyperplasia)   Traumatic brain injury ([place])   Leg swelling   Chronic combined systolic and diastolic CHF, NYHA class 2 (HCC)   CKD (chronic kidney disease) stage 3, GFR 30-59 ml/min   COPD with acute exacerbation (HCC)  Acute hypoxic respiratory failure due to COVID-19 pneumonia -Patient with known underlying COPD, but no oxygen requirement at baseline, he was noted to be hypoxic in ED requiring some oxygen, chest x-ray significant for multifocal pneumonia COVID-19. -Continue with IV steroids, will change  Decadron to IV Solu-Medrol  given decreased air entry bilaterally and underlying history of COPD. -continue With IV remdesivir. -So far no indication for Actemra given low CRP and low oxygen requirement. -It was encouraged use incentive spirometry, flutter valve, to get out of bed to chair.   COVID-19 Labs  Recent Labs    08/12/19  1543 08/12/19 1543 08/13/19 0231 08/14/19 0252 08/15/19 0256  DDIMER 0.55*   < > 0.52* 0.55* 0.49  FERRITIN 99  --   --   --   --   LDH 209*  --   --   --   --   CRP 1.3*   < > 1.0* 1.5* 1.4*   < > = values in this interval not displayed.    Lab Results  Component Value Date   SARSCOV2NAA NOT DETECTED 10/21/2018   Chronic combined systolic and diastolic CHF. -Appears to be with some volume overload, and significant elevated BNP . -2D echo showing significant drop of EF from 45% to <20%, and significant for global hypokinesis . -Continue with IV diuresis, renal function seems to be improving with diuresis, IV diuresis per cardiology, continue with Coreg, started on Entresto, he is on Lasix 160 mg IV twice daily. -Monitor ins and outs closely.  COPD -Proved air entry bilaterally, continue with Solu-Medrol -continue with home Spiriva and Symbicort  Chronic kidney disease stage IIIB -Function appears to be at baseline, continue to monitor closely as on diuresis  Essential hypertension -No further hypotension, continue with Coreg and Entresto .  Chronic lower extremity edema - Unna boots for leg swelling.  As well improving with diuresis  Traumatic brain injury. -Continue with supportive care  History of seizure disorder. - On phenobarbital we will continue the same.  Hyperlipidemia. - Continue Pravachol.  BPH. - Continue Flomax and Proscar.  GERD. - Continuing PPI  Code Status : Full Code  Family Communication  : D/W daughter Ashly via phone daily.  Disposition Plan  : Home  Barriers For Discharge : Hypoxic, on IV steroids  and remdesivir  Consults  :  None  Procedures  : None  DVT Prophylaxis  :  [place] heparin  Lab Results  Component Value Date   PLT 100 (L) 08/15/2019    Antibiotics  :    Anti-infectives (From admission, onward)   Start     Dose/Rate Route Frequency Ordered Stop   08/13/19 1000  remdesivir 100 mg in sodium chloride 0.9 % 100 mL IVPB     100 mg 200 mL/hr over 30 Minutes Intravenous Daily 08/12/19 1643 08/17/19 0959   08/12/19 1645  remdesivir 200 mg in sodium chloride 0.9% 250 mL IVPB     200 mg 580 mL/hr over 30 Minutes Intravenous Once 08/12/19 1643 08/12/19 1906        Objective:   Vitals:   08/14/19 2202 08/15/19 0454 08/15/19 0751 08/15/19 1028  BP: (!) 124/98 104/61 115/80   Pulse: 86 90 84 70  Resp: 17 (!) 23 15 (!) 21  Temp: 98.5 F (36.9 C) 98.4 F (36.9 C) 98.2 F (36.8 C)   TempSrc: Oral Oral Oral   SpO2: 92% 93% 94% 90%  Weight:      Height:        Wt Readings from Last 3 Encounters:  08/12/19 113.9 kg  08/10/19 113.9 kg  10/22/18 108.8 kg     Intake/Output Summary (Last 24 hours) at 08/15/2019 1151 Last data filed at 08/15/2019 0900 Gross per 24 hour  Intake 810 ml  Output 351 ml  Net 459 ml     Physical Exam  Awake Alert, Oriented X 3, No new F.N deficits, Normal affect Symmetrical Chest wall movement, Good air movement bilaterally,scattered rales RRR,No Gallops,Rubs or new Murmurs, No Parasternal Heave +ve B.Sounds, Abd Soft, No tenderness, No rebound - guarding or rigidity. No  Cyanosis, Clubbing , +2 edema, No new Rash or bruise     Data Review:    CBC Recent Labs  Lab 08/12/19 1543 08/13/19 0231 08/14/19 0252 08/15/19 0256  WBC 4.6 4.0 4.3 8.2  HGB 15.7 15.1 15.4 16.0  HCT 48.3 48.3 49.6 50.3  PLT 111* 101* 99* 100*  MCV 94.7 95.3 95.9 95.3  MCH 30.8 29.8 29.8 30.3  MCHC 32.5 31.3 31.0 31.8  RDW 14.2 14.2 13.9 13.7  LYMPHSABS 1.2 0.6* 0.5* 0.7  MONOABS 0.9 0.3 0.2 0.9  EOSABS 0.0 0.0 0.0 0.0  BASOSABS 0.0 0.0 0.0 0.0     Chemistries  Recent Labs  Lab 08/12/19 1543 08/13/19 0231 08/14/19 0252 08/15/19 0256  NA 141 145 142 142  K 3.6 3.7 4.0 3.6  CL 98 98 95* 95*  CO2 32 36* 37* 38*  GLUCOSE 126* 163* 159* 165*  BUN 40* 35* 30* 35*  CREATININE 1.64* 1.61* 1.30* 1.30*  CALCIUM 8.6* 8.7* 8.8* 8.8*  MG  --  2.1 2.2 2.1  AST 35 32 35 34  ALT 35 33 38 39  ALKPHOS 62 67 65 64  BILITOT 0.7 0.6 0.7 0.7   ------------------------------------------------------------------------------------------------------------------ Recent Labs    08/12/19 1543 08/15/19 0256  CHOL  --  134  HDL  --  35*  LDLCALC  --  77  TRIG 186* 110  CHOLHDL  --  3.8    Lab Results  Component Value Date   HGBA1C 5.4 10/05/2012   ------------------------------------------------------------------------------------------------------------------ No results for input(s): TSH, T4TOTAL, T3FREE, THYROIDAB in the last 72 hours.  Invalid input(s): FREET3 ------------------------------------------------------------------------------------------------------------------ Recent Labs    08/12/19 1543  FERRITIN 99    Coagulation profile No results for input(s): INR, PROTIME in the last 168 hours.  Recent Labs    08/14/19 0252 08/15/19 0256  DDIMER 0.55* 0.49    Cardiac Enzymes No results for input(s): CKMB, TROPONINI, MYOGLOBIN in the last 168 hours.  Invalid input(s): CK ------------------------------------------------------------------------------------------------------------------    Component Value Date/Time   BNP 1,462.8 (H) 08/14/2019 0252    Inpatient Medications  Scheduled Meds: . vitamin C  500 mg Oral Daily  . carvedilol  6.25 mg Oral BID WC  . finasteride  5 mg Oral Daily  . heparin  5,000 Units Subcutaneous Q8H  . insulin aspart  0-5 Units Subcutaneous QHS  . insulin aspart  0-9 Units Subcutaneous TID WC  . Ipratropium-Albuterol  1 puff Inhalation QID  . methylPREDNISolone (SOLU-MEDROL)  injection  40 mg Intravenous Q12H  . mometasone-formoterol  2 puff Inhalation BID  . pantoprazole  40 mg Oral Daily  . PHENobarbital  64.8 mg Oral BID  . pravastatin  40 mg Oral QPM  . sacubitril-valsartan  1 tablet Oral BID  . tamsulosin  0.4 mg Oral Daily  . umeclidinium bromide  18 puff Inhalation Daily  . zinc sulfate  220 mg Oral Daily   Continuous Infusions: . furosemide 160 mg (08/15/19 0754)  . remdesivir 100 mg in NS 100 mL 100 mg (08/15/19 0912)   PRN Meds:.acetaminophen, guaiFENesin-dextromethorphan, ondansetron **OR** ondansetron (ZOFRAN) IV, sodium chloride  Micro Results Recent Results (from the past 240 hour(s))  Blood Culture (routine x 2)     Status: None (Preliminary result)   Collection Time: 08/12/19  4:56 PM   Specimen: BLOOD  Result Value Ref Range Status   Specimen Description BLOOD SITE NOT SPECIFIED  Final   Special Requests   Final    BOTTLES DRAWN AEROBIC AND ANAEROBIC Blood  Culture adequate volume   Culture   Final    NO GROWTH 3 DAYS Performed at [facility], [place] [address], [place], [place] [address]    Report Status PENDING  Incomplete  Blood Culture (routine x 2)     Status: None (Preliminary result)   Collection Time: 08/12/19  5:02 PM   Specimen: BLOOD  Result Value Ref Range Status   Specimen Description BLOOD LEFT ANTECUBITAL  Final   Special Requests   Final    BOTTLES DRAWN AEROBIC AND ANAEROBIC Blood Culture adequate volume   Culture   Final    NO GROWTH 3 DAYS Performed at [facility], [place] [address], [place], [place] [address]    Report Status PENDING  Incomplete    Radiology Reports DG Chest Port 1 View  Result Date: 08/12/2019 CLINICAL DATA:  Shortness of breath, COVID positive EXAM: PORTABLE CHEST 1 VIEW COMPARISON:  06/29/2018 FINDINGS: Mild left basilar opacity, likely atelectasis. Lungs otherwise clear in this patient with known COVID. No pleural effusion or pneumothorax. Cardiomegaly.  Mild thoracic aortic  atherosclerosis. IMPRESSION: Mild left basilar opacity, likely atelectasis. Lungs otherwise clear in this patient with known COVID. Cardiomegaly.  Thoracic aortic atherosclerosis. Electronically Signed   By: Julian Hy M.D.   On: 08/12/2019 16:08   ECHOCARDIOGRAM COMPLETE  Result Date: 08/13/2019    ECHOCARDIOGRAM REPORT   Patient Name:   COLLEEN DONAHOE Date of Exam: 08/13/2019 Medical Rec #:  [id number]     Height:       68.0 in Accession #:    [id number]    Weight:       251.0 lb Date of Birth:  1945/10/02    BSA:          2.251 m Patient Age:    65 years      BP:           118/87 mmHg Patient Gender: M             HR:           88 bpm. Exam Location:  Inpatient Procedure: 2D Echo, Cardiac Doppler, Color Doppler and Intracardiac            Opacification Agent Indications:    I50.9* Heart failure (unspecified)  History:        Patient has prior history of Echocardiogram examinations, most                 recent 04/15/2015. CHF, CAD, COPD; Risk Factors:Hypertension and                 Dyslipidemia. Covid 19 positive. Hypoxia.  Sonographer:    Roseanna Rainbow RDCS Referring Phys: [id number] [place]  Sonographer Comments: Technically difficult study due to poor echo windows. Image acquisition challenging due to patient body habitus. IMPRESSIONS  1. Left ventricular ejection fraction, by estimation, is <20%. The left ventricle has severely decreased function. The left ventricle demonstrates global hypokinesis. The left ventricular internal cavity size was mildly dilated. There is mild concentric  left ventricular hypertrophy. Left ventricular diastolic function could not be evaluated. Elevated left ventricular end-diastolic pressure.  2. Right ventricular systolic function is severely reduced. The right ventricular size is severely enlarged. There is moderately elevated pulmonary artery systolic pressure.  3. The mitral valve is normal in structure. Mild mitral valve regurgitation. No evidence of mitral  stenosis.  4. The aortic valve is tricuspid. Aortic valve regurgitation is trivial. Mild to moderate aortic  valve sclerosis/calcification is present, without any evidence of aortic stenosis.  5. Aortic dilatation noted. There is mild dilatation of the aortic root and of the ascending aorta measuring 41 mm and 9mm respectively.  6. The inferior vena cava is dilated in size with <50% respiratory variability, suggesting right atrial pressure of 15 mmHg.  7. Left atrial size was severely dilated.  8. Right atrial size was mildly dilated. FINDINGS  Left Ventricle: Left ventricular ejection fraction, by estimation, is <20%. The left ventricle has severely decreased function. The left ventricle demonstrates global hypokinesis. The left ventricular internal cavity size was mildly dilated. There is mild concentric left ventricular hypertrophy. Abnormal (paradoxical) septal motion, consistent with left bundle branch block. Left ventricular diastolic function could not be evaluated. Elevated left ventricular end-diastolic pressure. Right Ventricle: The right ventricular size is severely enlarged. No increase in right ventricular wall thickness. Right ventricular systolic function is severely reduced. There is moderately elevated pulmonary artery systolic pressure. The tricuspid regurgitant velocity is 3.01 m/s, and with an assumed right atrial pressure of 15 mmHg, the estimated right ventricular systolic pressure is 10.2 mmHg. Left Atrium: Left atrial size was severely dilated. Right Atrium: Right atrial size was mildly dilated. Pericardium: There is no evidence of pericardial effusion. Mitral Valve: The mitral valve is normal in structure. Normal mobility of the mitral valve leaflets. Moderate mitral annular calcification. Mild mitral valve regurgitation. No evidence of mitral valve stenosis. Tricuspid Valve: The tricuspid valve is normal in structure. Tricuspid valve regurgitation is mild . No evidence of tricuspid  stenosis. Aortic Valve: The aortic valve is tricuspid. Aortic valve regurgitation is trivial. Mild to moderate aortic valve sclerosis/calcification is present, without any evidence of aortic stenosis. Mild to moderate aortic valve annular calcification. Pulmonic Valve: The pulmonic valve was normal in structure. Pulmonic valve regurgitation is mild. No evidence of pulmonic stenosis. Aorta: The aortic root is normal in size and structure and aortic dilatation noted. 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 Venous: The inferior vena cava is dilated in size with less than 50% respiratory variability, suggesting right atrial pressure of 15 mmHg. IAS/Shunts: No atrial level shunt detected by color flow Doppler.  LEFT VENTRICLE PLAX 2D LVIDd:         6.10 cm      Diastology LVIDs:         5.70 cm      LV e' lateral:   4.79 cm/s LV PW:         1.70 cm      LV E/e' lateral: 18.2 LV IVS:        1.20 cm      LV e' medial:    3.26 cm/s LVOT diam:     2.30 cm      LV E/e' medial:  26.8 LV SV:         38 LV SV Index:   17 LVOT Area:     4.15 cm  LV Volumes (MOD) LV vol d, MOD A2C: 216.0 ml LV vol d, MOD A4C: 186.0 ml LV vol s, MOD A2C: 188.0 ml LV vol s, MOD A4C: 173.0 ml LV SV MOD A2C:     28.0 ml LV SV MOD A4C:     186.0 ml LV SV MOD BP:      18.8 ml RIGHT VENTRICLE            IVC RV S prime:     7.07 cm/s  IVC diam: 2.80 cm TAPSE (M-mode): 1.1 cm LEFT ATRIUM              Index       RIGHT ATRIUM           Index LA diam:        5.00 cm  2.22 cm/m  RA Area:     24.20 cm LA Vol (A2C):   113.0 ml 50.20 ml/m RA Volume:   83.40 ml  37.05 ml/m LA Vol (A4C):   112.0 ml 49.76 ml/m LA Biplane Vol: 114.0 ml 50.65 ml/m  AORTIC VALVE             PULMONIC VALVE LVOT Vmax:   69.80 cm/s  PV Vmax:          2.25 m/s LVOT Vmean:  49.900 cm/s PV Peak grad:     20.2 mmHg LVOT VTI:    0.092 m     PR End Diast Vel: 2.31 msec  AORTA Ao Root diam: 4.10 cm Ao Asc diam:  4.10 cm MITRAL VALVE                TRICUSPID VALVE MV Area (PHT): 3.17 cm    TR Peak grad:   36.2 mmHg MV Decel Time: 239 msec    TR Vmax:        301.00 cm/s MV E velocity: 87.40 cm/s                            SHUNTS                            Systemic VTI:  0.09 m                            Systemic Diam: 2.30 cm Fransico Him MD Electronically signed by Fransico Him MD Signature Date/Time: 08/13/2019/3:03:36 PM    Final       Phillips Climes M.D on 08/15/2019 at 11:51 AM   After 7pm go to [email or web address] - password [email or web address]  [facility] -  Office  [phone]

## 2019-08-15 NOTE — Progress Notes (Signed)
Cardiology Progress Note  Patient ID: Ricky Hayden MRN: [id number] DOB: 1945-10-09 Date of Encounter: 08/15/2019  Primary Cardiologist: No primary care provider on file.  Subjective  Reports he feels ok. Still SOB. LE edema present.   ROS:  All other ROS reviewed and negative. Pertinent positives noted in the HPI.     Inpatient Medications  Scheduled Meds: . vitamin C  500 mg Oral Daily  . carvedilol  6.25 mg Oral BID WC  . finasteride  5 mg Oral Daily  . heparin  5,000 Units Subcutaneous Q8H  . insulin aspart  0-5 Units Subcutaneous QHS  . insulin aspart  0-9 Units Subcutaneous TID WC  . Ipratropium-Albuterol  1 puff Inhalation QID  . methylPREDNISolone (SOLU-MEDROL) injection  40 mg Intravenous Q12H  . mometasone-formoterol  2 puff Inhalation BID  . pantoprazole  40 mg Oral Daily  . PHENobarbital  64.8 mg Oral BID  . pravastatin  40 mg Oral QPM  . sacubitril-valsartan  1 tablet Oral BID  . tamsulosin  0.4 mg Oral Daily  . umeclidinium bromide  18 puff Inhalation Daily  . zinc sulfate  220 mg Oral Daily   Continuous Infusions: . furosemide 160 mg (08/15/19 0754)  . remdesivir 100 mg in NS 100 mL 100 mg (08/15/19 0912)   PRN Meds: acetaminophen, guaiFENesin-dextromethorphan, ondansetron **OR** ondansetron (ZOFRAN) IV, sodium chloride   Vital Signs   Vitals:   08/14/19 1057 08/14/19 1630 08/14/19 2202 08/15/19 0454  BP:  119/85 (!) 124/98 104/61  Pulse:  82 86 90  Resp:  (!) 22 17 (!) 23  Temp:  98 F (36.7 C) 98.5 F (36.9 C) 98.4 F (36.9 C)  TempSrc:  Oral Oral Oral  SpO2: 91% 96% 92% 93%  Weight:      Height:        Intake/Output Summary (Last 24 hours) at 08/15/2019 1017 Last data filed at 08/15/2019 0534 Gross per 24 hour  Intake 570 ml  Output 351 ml  Net 219 ml   Last 3 Weights 08/12/2019 08/10/2019 10/22/2018  Weight (lbs) 251 lb 251 lb 239 lb 12.8 oz  Weight (kg) 113.853 kg 113.853 kg 108.773 kg      Telemetry  Overnight telemetry shows NSR  90s with PVCs, which I personally reviewed.   ECG  The most recent ECG shows NSR LBBB (170 ms), which I personally reviewed.   Physical Exam   Vitals:   08/14/19 1057 08/14/19 1630 08/14/19 2202 08/15/19 0454  BP:  119/85 (!) 124/98 104/61  Pulse:  82 86 90  Resp:  (!) 22 17 (!) 23  Temp:  98 F (36.7 C) 98.5 F (36.9 C) 98.4 F (36.9 C)  TempSrc:  Oral Oral Oral  SpO2: 91% 96% 92% 93%  Weight:      Height:         Intake/Output Summary (Last 24 hours) at 08/15/2019 1017 Last data filed at 08/15/2019 0534 Gross per 24 hour  Intake 570 ml  Output 351 ml  Net 219 ml    Last 3 Weights 08/12/2019 08/10/2019 10/22/2018  Weight (lbs) 251 lb 251 lb 239 lb 12.8 oz  Weight (kg) 113.853 kg 113.853 kg 108.773 kg    Body mass index is 38.16 kg/m.   General: Well nourished, well developed, in no acute distress Head: Atraumatic, normal size  Eyes: PEERLA, EOMI  Neck: Supple, JVD 12-15 cm H20 Endocrine: No thryomegaly Cardiac: Normal S1, S2; RRR; no murmurs, rubs, or gallops Lungs: diffuse rales/rhonchi  Abd: Soft, nontender, no hepatomegaly  Ext: 2+ LE edema  Musculoskeletal: No deformities, BUE and BLE strength normal and equal Skin: Warm and dry, no rashes   Neuro: Alert and oriented to person, place, time, and situation, CNII-XII grossly intact, no focal deficits  Psych: Normal mood and affect   Labs  High Sensitivity Troponin:  No results for input(s): TROPONINIHS in the last 720 hours.   Cardiac EnzymesNo results for input(s): TROPONINI in the last 168 hours. No results for input(s): TROPIPOC in the last 168 hours.  Chemistry Recent Labs  Lab 08/13/19 0231 08/14/19 0252 08/15/19 0256  NA 145 142 142  K 3.7 4.0 3.6  CL 98 95* 95*  CO2 36* 37* 38*  GLUCOSE 163* 159* 165*  BUN 35* 30* 35*  CREATININE 1.61* 1.30* 1.30*  CALCIUM 8.7* 8.8* 8.8*  PROT 6.4* 6.6 6.5  ALBUMIN 3.1* 3.2* 3.1*  AST 32 35 34  ALT 33 38 39  ALKPHOS 67 65 64  BILITOT 0.6 0.7 0.7  GFRNONAA  41* 53* 53*  GFRAA 48* >60 >60  ANIONGAP 11 10 9     Hematology Recent Labs  Lab 08/13/19 0231 08/14/19 0252 08/15/19 0256  WBC 4.0 4.3 8.2  RBC 5.07 5.17 5.28  HGB 15.1 15.4 16.0  HCT 48.3 49.6 50.3  MCV 95.3 95.9 95.3  MCH 29.8 29.8 30.3  MCHC 31.3 31.0 31.8  RDW 14.2 13.9 13.7  PLT 101* 99* 100*   BNP Recent Labs  Lab 08/14/19 0252  BNP 1,462.8*    DDimer  Recent Labs  Lab 08/13/19 0231 08/14/19 0252 08/15/19 0256  DDIMER 0.52* 0.55* 0.49     Radiology  ECHOCARDIOGRAM COMPLETE  Result Date: 08/13/2019    ECHOCARDIOGRAM REPORT   Patient Name:   Ricky Hayden Date of Exam: 08/13/2019 Medical Rec #:  [id number]     Height:       68.0 in Accession #:    [id number]    Weight:       251.0 lb Date of Birth:  Nov 01, 1945    BSA:          2.251 m Patient Age:    58 years      BP:           118/87 mmHg Patient Gender: M             HR:           88 bpm. Exam Location:  Inpatient Procedure: 2D Echo, Cardiac Doppler, Color Doppler and Intracardiac            Opacification Agent Indications:    I50.9* Heart failure (unspecified)  History:        Patient has prior history of Echocardiogram examinations, most                 recent 04/15/2015. CHF, CAD, COPD; Risk Factors:Hypertension and                 Dyslipidemia. Covid 19 positive. Hypoxia.  Sonographer:    Roseanna Rainbow RDCS Referring Phys: [id number] [place]  Sonographer Comments: Technically difficult study due to poor echo windows. Image acquisition challenging due to patient body habitus. IMPRESSIONS  1. Left ventricular ejection fraction, by estimation, is <20%. The left ventricle has severely decreased function. The left ventricle demonstrates global hypokinesis. The left ventricular internal cavity size was mildly dilated. There is mild concentric  left ventricular hypertrophy. Left ventricular diastolic function could not be evaluated. Elevated left ventricular  end-diastolic pressure.  2. Right ventricular systolic function is  severely reduced. The right ventricular size is severely enlarged. There is moderately elevated pulmonary artery systolic pressure.  3. The mitral valve is normal in structure. Mild mitral valve regurgitation. No evidence of mitral stenosis.  4. The aortic valve is tricuspid. Aortic valve regurgitation is trivial. Mild to moderate aortic valve sclerosis/calcification is present, without any evidence of aortic stenosis.  5. Aortic dilatation noted. There is mild dilatation of the aortic root and of the ascending aorta measuring 41 mm and 68mm respectively.  6. The inferior vena cava is dilated in size with <50% respiratory variability, suggesting right atrial pressure of 15 mmHg.  7. Left atrial size was severely dilated.  8. Right atrial size was mildly dilated. FINDINGS  Left Ventricle: Left ventricular ejection fraction, by estimation, is <20%. The left ventricle has severely decreased function. The left ventricle demonstrates global hypokinesis. The left ventricular internal cavity size was mildly dilated. There is mild concentric left ventricular hypertrophy. Abnormal (paradoxical) septal motion, consistent with left bundle branch block. Left ventricular diastolic function could not be evaluated. Elevated left ventricular end-diastolic pressure. Right Ventricle: The right ventricular size is severely enlarged. No increase in right ventricular wall thickness. Right ventricular systolic function is severely reduced. There is moderately elevated pulmonary artery systolic pressure. The tricuspid regurgitant velocity is 3.01 m/s, and with an assumed right atrial pressure of 15 mmHg, the estimated right ventricular systolic pressure is 34.1 mmHg. Left Atrium: Left atrial size was severely dilated. Right Atrium: Right atrial size was mildly dilated. Pericardium: There is no evidence of pericardial effusion. Mitral Valve: The mitral valve is normal in structure. Normal mobility of the mitral valve leaflets. Moderate  mitral annular calcification. Mild mitral valve regurgitation. No evidence of mitral valve stenosis. Tricuspid Valve: The tricuspid valve is normal in structure. Tricuspid valve regurgitation is mild . No evidence of tricuspid stenosis. Aortic Valve: The aortic valve is tricuspid. Aortic valve regurgitation is trivial. Mild to moderate aortic valve sclerosis/calcification is present, without any evidence of aortic stenosis. Mild to moderate aortic valve annular calcification. Pulmonic Valve: The pulmonic valve was normal in structure. Pulmonic valve regurgitation is mild. No evidence of pulmonic stenosis. Aorta: The aortic root is normal in size and structure and aortic dilatation noted. 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 Venous: The inferior vena cava is dilated in size with less than 50% respiratory variability, suggesting right atrial pressure of 15 mmHg. IAS/Shunts: No atrial level shunt detected by color flow Doppler.  LEFT VENTRICLE PLAX 2D LVIDd:         6.10 cm      Diastology LVIDs:         5.70 cm      LV e' lateral:   4.79 cm/s LV PW:         1.70 cm      LV E/e' lateral: 18.2 LV IVS:        1.20 cm      LV e' medial:    3.26 cm/s LVOT diam:     2.30 cm      LV E/e' medial:  26.8 LV SV:         38 LV SV Index:   17 LVOT Area:     4.15 cm  LV Volumes (MOD) LV vol d, MOD A2C: 216.0 ml LV vol d, MOD A4C: 186.0 ml LV vol s, MOD A2C: 188.0 ml  LV vol s, MOD A4C: 173.0 ml LV SV MOD A2C:     28.0 ml LV SV MOD A4C:     186.0 ml LV SV MOD BP:      18.8 ml RIGHT VENTRICLE            IVC RV S prime:     7.07 cm/s  IVC diam: 2.80 cm TAPSE (M-mode): 1.1 cm LEFT ATRIUM              Index       RIGHT ATRIUM           Index LA diam:        5.00 cm  2.22 cm/m  RA Area:     24.20 cm LA Vol (A2C):   113.0 ml 50.20 ml/m RA Volume:   83.40 ml  37.05 ml/m LA Vol (A4C):   112.0 ml 49.76 ml/m LA Biplane Vol: 114.0 ml 50.65 ml/m  AORTIC VALVE             PULMONIC VALVE  LVOT Vmax:   69.80 cm/s  PV Vmax:          2.25 m/s LVOT Vmean:  49.900 cm/s PV Peak grad:     20.2 mmHg LVOT VTI:    0.092 m     PR End Diast Vel: 2.31 msec  AORTA Ao Root diam: 4.10 cm Ao Asc diam:  4.10 cm MITRAL VALVE               TRICUSPID VALVE MV Area (PHT): 3.17 cm    TR Peak grad:   36.2 mmHg MV Decel Time: 239 msec    TR Vmax:        301.00 cm/s MV E velocity: 87.40 cm/s                            SHUNTS                            Systemic VTI:  0.09 m                            Systemic Diam: 2.30 cm Fransico Him MD Electronically signed by Fransico Him MD Signature Date/Time: 08/13/2019/3:03:36 PM    Final     Cardiac Studies  TTE 08/13/2019 1. Left ventricular ejection fraction, by estimation, is <20%. The left  ventricle has severely decreased function. The left ventricle demonstrates  global hypokinesis. The left ventricular internal cavity size was mildly  dilated. There is mild concentric  left ventricular hypertrophy. Left ventricular diastolic function could  not be evaluated. Elevated left ventricular end-diastolic pressure.  2. Right ventricular systolic function is severely reduced. The right  ventricular size is severely enlarged. There is moderately elevated  pulmonary artery systolic pressure.  3. The mitral valve is normal in structure. Mild mitral valve  regurgitation. No evidence of mitral stenosis.  4. The aortic valve is tricuspid. Aortic valve regurgitation is trivial.  Mild to moderate aortic valve sclerosis/calcification is present, without  any evidence of aortic stenosis.  5. Aortic dilatation noted. There is mild dilatation of the aortic root  and of the ascending aorta measuring 41 mm and 80mm respectively.  6. The inferior vena cava is dilated in size with <50% respiratory  variability, suggesting right atrial pressure of 15 mmHg.  7. Left atrial size was  severely dilated.  8. Right atrial size was mildly dilated.   Patient Profile  Ricky Hayden is a 74 y.o. male with COPD, systolic HF, brain abscess in 1995, seizure disorder, gout HTN, CKD III admitted 4/10 with covid PNA and decompensated HF.   Assessment & Plan   1. Acute hypoxic respiratory failure -2/2 covid and acute decompensated systolic HF -continue current covid medications -steroid not ideal in HF but no real choice   2. 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 LVOT VTI 9 cm.  -warm and wet on exam with gross volume overload -continue coreg 6.25 mg BID -started entresto 24-26 mg BID and no major issues (had issues with K in the past on ACE/ARB) -I increased his lasix to 160 mg BID IV today -no indications of low output at this time -will need to be considered for LHC, BiV ICD as outpatient. Apparently was not on GDMT as care was at [facility] and did not tolerate medications per their report  -I think we can get him dry but if run into issues may be good candidate for digoxin to bridge him through coronavirus   3. HTN, with hypotension on admission -hypotension resolved   4. AKI on CKD stage III -will improve with diuresis   5. Hyperlipidemia  -check lipid profile in AM and consider switching to higher intensity statin given aortic atherosclerosis.   6. Prolonged QTc  -LBBB 170 ms. No issues   For questions or updates, please contact [place] Please consult [email or web address] for contact info under   Time Spent with Patient: I have spent a total of 25 minutes with patient reviewing hospital notes, telemetry, EKGs, labs and examining the patient as well as establishing an assessment and plan that was discussed with the patient.  > 50% of time was spent in direct patient care.    Signed, Addison Naegeli. Audie Box, [place]  08/15/2019 10:17 AM

## 2019-08-15 NOTE — Plan of Care (Signed)

## 2019-08-16 LAB — GLUCOSE, CAPILLARY
Glucose-Capillary: 118 mg/dL — ABNORMAL HIGH (ref 70–99)
Glucose-Capillary: 187 mg/dL — ABNORMAL HIGH (ref 70–99)
Glucose-Capillary: 137 mg/dL — ABNORMAL HIGH (ref 70–99)
Glucose-Capillary: 194 mg/dL — ABNORMAL HIGH (ref 70–99)

## 2019-08-16 LAB — COMPREHENSIVE METABOLIC PANEL
ALT: 28 U/L (ref 0–44)
AST: 23 U/L (ref 15–41)
Albumin: 3 g/dL — ABNORMAL LOW (ref 3.5–5.0)
Alkaline Phosphatase: 63 U/L (ref 38–126)
Anion gap: 11 (ref 5–15)
BUN: 34 mg/dL — ABNORMAL HIGH (ref 8–23)
CO2: 35 mmol/L — ABNORMAL HIGH (ref 22–32)
Calcium: 8.4 mg/dL — ABNORMAL LOW (ref 8.9–10.3)
Chloride: 94 mmol/L — ABNORMAL LOW (ref 98–111)
Creatinine, Ser: 1.19 mg/dL (ref 0.61–1.24)
GFR calc Af Amer: >60 mL/min (ref >60)
GFR calc non Af Amer: 59 mL/min — ABNORMAL LOW (ref >60)
Glucose, Bld: 143 mg/dL — ABNORMAL HIGH (ref 70–99)
Potassium: 3.8 mmol/L (ref 3.5–5.1)
Sodium: 140 mmol/L (ref 135–145)
Total Bilirubin: 0.9 mg/dL (ref 0.3–1.2)
Total Protein: 6.2 g/dL — ABNORMAL LOW (ref 6.5–8.1)

## 2019-08-16 LAB — CBC WITH DIFFERENTIAL/PLATELET
Abs Immature Granulocytes: 0.03 10*3/uL (ref 0.00–0.07)
Basophils Absolute: 0 10*3/uL (ref 0.0–0.1)
Basophils Relative: 0 %
Eosinophils Absolute: 0 10*3/uL (ref 0.0–0.5)
Eosinophils Relative: 0 %
HCT: 51.3 % (ref 39.0–52.0)
Hemoglobin: 16.4 g/dL (ref 13.0–17.0)
Immature Granulocytes: 1 %
Lymphocytes Relative: 11 %
Lymphs Abs: 0.7 10*3/uL (ref 0.7–4.0)
MCH: 30 pg (ref 26.0–34.0)
MCHC: 32 g/dL (ref 30.0–36.0)
MCV: 93.8 fL (ref 80.0–100.0)
Monocytes Absolute: 0.7 10*3/uL (ref 0.1–1.0)
Monocytes Relative: 11 %
Neutro Abs: 4.8 10*3/uL (ref 1.7–7.7)
Neutrophils Relative %: 77 %
Platelets: 104 10*3/uL — ABNORMAL LOW (ref 150–400)
RBC: 5.47 MIL/uL (ref 4.22–5.81)
RDW: 13.8 % (ref 11.5–15.5)
WBC: 6.2 10*3/uL (ref 4.0–10.5)
nRBC: 0 % (ref 0.0–0.2)

## 2019-08-16 LAB — C-REACTIVE PROTEIN: CRP: 1.7 mg/dL — ABNORMAL HIGH (ref <1.0)

## 2019-08-16 LAB — MAGNESIUM: Magnesium: 2.1 mg/dL (ref 1.7–2.4)

## 2019-08-16 LAB — D-DIMER, QUANTITATIVE: D-Dimer, Quant: 0.49 ug/mL-FEU (ref 0.00–0.50)

## 2019-08-16 MED ORDER — POTASSIUM CHLORIDE CRYS ER 20 MEQ PO TBCR
40.0000 meq | EXTENDED_RELEASE_TABLET | Freq: Two times a day (BID) | ORAL | Status: AC
  Administered 2019-08-16: 40 meq via ORAL
  Filled 2019-08-16: qty 2

## 2019-08-16 MED ORDER — CARVEDILOL 12.5 MG PO TABS
12.5000 mg | ORAL_TABLET | Freq: Two times a day (BID) | ORAL | Status: DC
  Administered 2019-08-16 (×2): 12.5 mg via ORAL
  Filled 2019-08-16 (×2): qty 1

## 2019-08-16 MED ORDER — ROSUVASTATIN CALCIUM 20 MG PO TABS
20.0000 mg | ORAL_TABLET | Freq: Every day | ORAL | Status: DC
  Administered 2019-08-16 – 2019-08-18 (×3): 20 mg via ORAL
  Filled 2019-08-16 (×3): qty 1

## 2019-08-16 MED ORDER — SPIRONOLACTONE 12.5 MG HALF TABLET
12.5000 mg | ORAL_TABLET | Freq: Every day | ORAL | Status: DC
  Administered 2019-08-16 – 2019-08-19 (×4): 12.5 mg via ORAL
  Filled 2019-08-16 (×4): qty 1

## 2019-08-16 MED ORDER — FUROSEMIDE 10 MG/ML IJ SOLN
160.0000 mg | Freq: Two times a day (BID) | INTRAVENOUS | Status: AC
  Administered 2019-08-16 (×2): 160 mg via INTRAVENOUS
  Filled 2019-08-16: qty 10
  Filled 2019-08-16: qty 16

## 2019-08-16 NOTE — Progress Notes (Signed)
Cardiology Progress Note  Patient ID: Ricky Hayden MRN: [id number] DOB: Dec 14, 1945 Date of Encounter: 08/16/2019  Primary Cardiologist: No primary care provider on file.  Subjective  Good urine output. Tolerating HF meds. Up titrate today.   ROS:  All other ROS reviewed and negative. Pertinent positives noted in the HPI.     Inpatient Medications  Scheduled Meds: . vitamin C  500 mg Oral Daily  . carvedilol  12.5 mg Oral BID WC  . finasteride  5 mg Oral Daily  . heparin  5,000 Units Subcutaneous Q8H  . insulin aspart  0-5 Units Subcutaneous QHS  . insulin aspart  0-9 Units Subcutaneous TID WC  . Ipratropium-Albuterol  1 puff Inhalation QID  . methylPREDNISolone (SOLU-MEDROL) injection  40 mg Intravenous Q12H  . mometasone-formoterol  2 puff Inhalation BID  . pantoprazole  40 mg Oral Daily  . PHENobarbital  64.8 mg Oral BID  . potassium chloride  40 mEq Oral BID  . pravastatin  40 mg Oral QPM  . sacubitril-valsartan  1 tablet Oral BID  . spironolactone  12.5 mg Oral Daily  . tamsulosin  0.4 mg Oral Daily  . umeclidinium bromide  18 puff Inhalation Daily  . zinc sulfate  220 mg Oral Daily   Continuous Infusions: . furosemide 160 mg (08/16/19 0842)   PRN Meds: acetaminophen, guaiFENesin-dextromethorphan, ondansetron **OR** ondansetron (ZOFRAN) IV, sodium chloride   Vital Signs   Vitals:   08/15/19 2014 08/15/19 2300 08/16/19 0415 08/16/19 0800  BP: 93/66  107/70   Pulse: 76 80 93 85  Resp: (!) 24 15 (!) 22 20  Temp: 98.5 F (36.9 C)  98.5 F (36.9 C)   TempSrc: Oral  Oral   SpO2: 90% 91% 92% 98%  Weight:      Height:        Intake/Output Summary (Last 24 hours) at 08/16/2019 0932 Last data filed at 08/16/2019 0700 Gross per 24 hour  Intake 979 ml  Output 3425 ml  Net -2446 ml   Last 3 Weights 08/12/2019 08/10/2019 10/22/2018  Weight (lbs) 251 lb 251 lb 239 lb 12.8 oz  Weight (kg) 113.853 kg 113.853 kg 108.773 kg      Telemetry  Overnight telemetry shows  NSR 90s with PVCs, which I personally reviewed.   ECG  The most recent ECG shows NSR LBBB (170 ms), which I personally reviewed.   Physical Exam   Vitals:   08/15/19 2014 08/15/19 2300 08/16/19 0415 08/16/19 0800  BP: 93/66  107/70   Pulse: 76 80 93 85  Resp: (!) 24 15 (!) 22 20  Temp: 98.5 F (36.9 C)  98.5 F (36.9 C)   TempSrc: Oral  Oral   SpO2: 90% 91% 92% 98%  Weight:      Height:         Intake/Output Summary (Last 24 hours) at 08/16/2019 0932 Last data filed at 08/16/2019 0700 Gross per 24 hour  Intake 979 ml  Output 3425 ml  Net -2446 ml    Last 3 Weights 08/12/2019 08/10/2019 10/22/2018  Weight (lbs) 251 lb 251 lb 239 lb 12.8 oz  Weight (kg) 113.853 kg 113.853 kg 108.773 kg    Body mass index is 38.16 kg/m.   General: Well nourished, well developed, in no acute distress Head: Atraumatic, normal size  Eyes: PEERLA, EOMI  Neck: Supple, JVD 10-12 cm H20 Endocrine: No thryomegaly Cardiac: Normal S1, S2; RRR; no murmurs, rubs, or gallops Lungs: diffuse rales/rhonchi   Abd: Soft, nontender,  no hepatomegaly  Ext: 2+ LE edema  Musculoskeletal: No deformities, BUE and BLE strength normal and equal Skin: Warm and dry, no rashes   Neuro: Alert and oriented to person, place, time, and situation, CNII-XII grossly intact, no focal deficits  Psych: Normal mood and affect   Labs  High Sensitivity Troponin:  No results for input(s): TROPONINIHS in the last 720 hours.   Cardiac EnzymesNo results for input(s): TROPONINI in the last 168 hours. No results for input(s): TROPIPOC in the last 168 hours.  Chemistry Recent Labs  Lab 08/14/19 0252 08/15/19 0256 08/16/19 0253  NA 142 142 140  K 4.0 3.6 3.8  CL 95* 95* 94*  CO2 37* 38* 35*  GLUCOSE 159* 165* 143*  BUN 30* 35* 34*  CREATININE 1.30* 1.30* 1.19  CALCIUM 8.8* 8.8* 8.4*  PROT 6.6 6.5 6.2*  ALBUMIN 3.2* 3.1* 3.0*  AST 35 34 23  ALT 38 39 28  ALKPHOS 65 64 63  BILITOT 0.7 0.7 0.9  GFRNONAA 53* 53* 59*  GFRAA  >60 >60 >60  ANIONGAP 10 9 11     Hematology Recent Labs  Lab 08/14/19 0252 08/15/19 0256 08/16/19 0253  WBC 4.3 8.2 6.2  RBC 5.17 5.28 5.47  HGB 15.4 16.0 16.4  HCT 49.6 50.3 51.3  MCV 95.9 95.3 93.8  MCH 29.8 30.3 30.0  MCHC 31.0 31.8 32.0  RDW 13.9 13.7 13.8  PLT 99* 100* 104*   BNP Recent Labs  Lab 08/14/19 0252  BNP 1,462.8*    DDimer  Recent Labs  Lab 08/14/19 0252 08/15/19 0256 08/16/19 0253  DDIMER 0.55* 0.49 0.49     Radiology  No results found.  Cardiac Studies  TTE 08/13/2019 1. Left ventricular ejection fraction, by estimation, is <20%. The left  ventricle has severely decreased function. The left ventricle demonstrates  global hypokinesis. The left ventricular internal cavity size was mildly  dilated. There is mild concentric  left ventricular hypertrophy. Left ventricular diastolic function could  not be evaluated. Elevated left ventricular end-diastolic pressure.  2. Right ventricular systolic function is severely reduced. The right  ventricular size is severely enlarged. There is moderately elevated  pulmonary artery systolic pressure.  3. The mitral valve is normal in structure. Mild mitral valve  regurgitation. No evidence of mitral stenosis.  4. The aortic valve is tricuspid. Aortic valve regurgitation is trivial.  Mild to moderate aortic valve sclerosis/calcification is present, without  any evidence of aortic stenosis.  5. Aortic dilatation noted. There is mild dilatation of the aortic root  and of the ascending aorta measuring 41 mm and 90mm respectively.  6. The inferior vena cava is dilated in size with <50% respiratory  variability, suggesting right atrial pressure of 15 mmHg.  7. Left atrial size was severely dilated.  8. Right atrial size was mildly dilated.   Patient Profile  Ricky Hayden is a 74 y.o. male with COPD, systolic HF, brain abscess in 1995, seizure disorder, gout HTN, CKD III admitted 4/10 with covid PNA and  decompensated HF.   Assessment & Plan   1. Acute hypoxic respiratory failure -2/2 covid and acute decompensated systolic HF -continue current covid medications -steroid not ideal in HF but no real choice   2. 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 LVOT VTI 9 cm.  -warm and wet on exam with gross volume overload -continue lasix 160 mg BID IV; Net negative 2 L overnight -continue coreg 12.5 mg  BID -continue entresto 24-26 mg BID -add aldactone 12.5 mg QD  -will need to be considered for LHC, BiV ICD as outpatient. Apparently was not on GDMT as care was at [facility] and did not tolerate medications per their report  -tolerating meds well without issues   3. HTN, with hypotension on admission -hypotension resolved   4. AKI on CKD stage III -continues to improve with diuresis   5. Hyperlipidemia  -switched to crestor 20 mg QD   6. Prolonged QTc  -LBBB 170 ms. No issues   For questions or updates, please contact [place] Please consult [email or web address] for contact info under   Time Spent with Patient: I have spent a total of 25 minutes with patient reviewing hospital notes, telemetry, EKGs, labs and examining the patient as well as establishing an assessment and plan that was discussed with the patient.  > 50% of time was spent in direct patient care.    Signed, Addison Naegeli. Audie Box, [place]  08/16/2019 9:32 AM

## 2019-08-16 NOTE — Evaluation (Signed)
Physical Therapy Evaluation Patient Details Name: Ricky Hayden MRN: [id number] DOB: 1946-01-22 Today's Date: 08/16/2019   History of Present Illness  74 year old male to urgent care with fatigue and generalized weakness. Patient found to be COVID positive and hypotensive and admitted to the hospital on 08/12/2019. Patient's wife also hospitalized with COVID per patient report. chest x-ray significant for multifocal pneumonia. Cardiology consulted for LVEF 20%. 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 Patient reports falls at home this year but describes them as "not bad falls." Oxygen saturation stable on 2L [place] during session. Pending patient's medical course (patient reports he may have heart surgery?) and mobility progression while in the hospital, patient may need SNF for short term rehabilitation prior to discharge home vs being able to progress in order to discharge home with home OT. His wife is currently hospitalized with COVID so no one would be at home with him unless one of his children come to stay with him.    Follow Up Recommendations Home health PT;SNF;Supervision/Assistance - 24 hour((pending hospital course and mobility progress while in the hospital)    Equipment Recommendations  Rolling walker with 5" wheels    Recommendations for Other Services OT consult     Precautions / Restrictions Precautions Precautions: Fall;Other (comment) Precaution Comments: bilat Unna boots Restrictions Weight Bearing Restrictions: No      Mobility  Bed Mobility  General bed  mobility comments: Patient in chair upon PT entrance.  Transfers Overall transfer level: Needs assistance Equipment used: None;Rolling walker (2 wheeled) Transfers: Sit to/from Stand Sit to Stand: Supervision;Min guard         General transfer comment: sit<>stand with RW trial 1, sit<>stand without AD trial 2  Ambulation/Gait Ambulation/Gait assistance: Min guard Gait Distance (Feet): 30 Feet Assistive device: Rolling walker (2 wheeled) Gait Pattern/deviations: Decreased step length - right;Decreased step length - left Gait velocity: decreased   General Gait Details: 2 near LOB that patient corrected with stepping response and contact guard from PT      Balance Overall balance assessment: Needs assistance         Standing balance support: No upper extremity supported;Bilateral upper extremity supported Standing balance-Leahy Scale: Fair Standing balance comment: 2 near LOB with ambulation with RW, patient reports falls at home       Pertinent Vitals/Pain Pain Assessment: 0-10 Pain Score: 4  Pain Location: chest from coughing Pain Intervention(s): Monitored during session;Limited activity within patient's tolerance    Home Living Family/patient expects to be discharged to:: Private residence Living Arrangements: Spouse/significant other Available Help at Discharge: Family Type of Home: House Home Access: Stairs to enter Entrance Stairs-Rails: None Entrance Stairs-Number of Steps: 1 Home Layout: One level Home Equipment: Shower seat;Hospital bed;Other (comment)(walking chair) Additional Comments: wife is hospitalized with COVID on 3rd floor    Prior Function Level of Independence: Independent with assistive device(s)    Comments: just got walking chair from [place], uses it for community mobility, limited household mobility due to COPD, no home O2, wife does cooking/cleaning/grocery shopping, son does yard work, children live close by and help with things around the  house  Extremity/Trunk Assessment   Upper Extremity Assessment Upper Extremity Assessment: (reports baseline numbness L hand last two fingers)    Lower Extremity Assessment Lower Extremity Assessment: LLE deficits/detail;RLE deficits/detail RLE Deficits / Details: R hip flexion 3/5, knee and ankle strength 4/5 LLE Deficits / Details: L hip flexion 3/5, knee and ankle strength 4/5       Communication   Communication: No difficulties  Cognition Arousal/Alertness: Awake/alert Behavior During Therapy: WFL for tasks assessed/performed    General Comments General comments (skin integrity, edema, etc.): Patient on 2L [place], at rest 95%, HR 76 bpm, RR 23. Oxygen saturation and HR stable on 2L with ambulation in room.        Assessment/Plan    PT Assessment Patient needs continued PT services  PT Problem List Decreased strength;Decreased activity tolerance;Decreased balance;Decreased mobility;Decreased safety awareness;Decreased knowledge of use of DME;Cardiopulmonary status limiting activity       PT Treatment Interventions DME instruction;Gait training;Stair training;Functional mobility training;Therapeutic activities;Therapeutic exercise;Balance training;Patient/family education    PT Goals (Current goals can be found in the Care Plan section)  Acute Rehab PT Goals Patient Stated Goal: to go fishing again PT Goal Formulation: With patient Time For Goal Achievement: 08/29/19 Potential to Achieve Goals: Good    Frequency Min 3X/week   Barriers to discharge Decreased caregiver support wife is hospitalized with COVID       AM-PAC PT "6 Clicks" Mobility  Outcome Measure Help needed turning from your back to your side while in a flat bed without using bedrails?: None Help needed moving from lying on your back to sitting on the side of a flat bed without using bedrails?: A Little Help needed moving to and from a bed to a chair (including a wheelchair)?: A Little Help  needed standing up from a chair using your arms (e.g., wheelchair or bedside chair)?: A Little Help needed to walk in hospital room?: A Little Help needed climbing 3-5 steps with a railing? : A Lot 6 Click Score: 18    End of Session Equipment Utilized During Treatment: Gait belt;Oxygen Activity Tolerance: Patient tolerated treatment well Patient left: in chair;with call bell/phone within reach;with chair alarm set Nurse Communication: Mobility status PT Visit Diagnosis: Unsteadiness on feet (R26.81);Repeated falls (R29.6);Other abnormalities of gait and mobility (R26.89)    Time: 8466-5993 PT Time Calculation (min) (ACUTE ONLY): 36 min   Charges:   PT Evaluation $PT Eval Moderate Complexity: 1 Mod          Birdie Hopes, PT, DPT Acute Rehab [phone] office    Birdie Hopes 08/16/2019, 11:33 AM

## 2019-08-16 NOTE — Progress Notes (Addendum)
PROGRESS NOTE                                                                                                                                                                                                             Patient Demographics:    Ricky Hayden, is a 74 y.o. male, DOB - 1946-04-29, XFG:[id number]  Outpatient Primary MD for the patient is Patient, No Pcp Per   Admit date - 08/12/2019   LOS - 4  Chief Complaint  Patient presents with  . Shortness of Breath       Brief Narrative: Patient is a 74 y.o. male with PMHx of chronic systolic heart failure, CAD, BPH, GERD, CKD stage IIIb, COPD hypotension and acute hypoxic respiratory failure in the setting of decompensated systolic heart failure and COVID-19 pneumonia.  See below for further details.  Significant Events: 4/10>> admit to [facility] 4/11>> EF < 20%, severe RV systolic dysfunction   RCVEL-38 medications: Steroids: 4/10>> Remdesivir: 4/10>> 4/14  Antibiotics: None  Microbiology data: 4/10>> Blood cultures: Negative  DVT prophylaxis: SQ heparin  Procedures: None  Consults: Cardiology    Subjective:    Ricky Hayden today feels better-still on around 2-2 L of oxygen.  Less swelling in his legs.   Assessment  & Plan :   Acute Hypoxic Resp Failure due to Covid 19 Viral pneumonia decompensated systolic heart failure: Improving-continue with attempts to slowly titrate down FiO2.  Remains on steroids/remdesivir-inflammatory markers are downtrending.  Volume status improving with and Aldactone.  Tolerating Coreg and Entresto.  Cardiology continues to follow.  Fever: afebrile  O2 requirements:  SpO2: 98 % O2 Flow Rate (L/min): 2.5 L/min   COVID-19 Labs: Recent Labs    08/14/19 0252 08/15/19 0256 08/16/19 0253  DDIMER 0.55* 0.49 0.49  CRP 1.5* 1.4* 1.7*       Component Value Date/Time   BNP 1,462.8 (H) 08/14/2019 0252    Recent Labs   Lab 08/12/19 1543  PROCALCITON 0.15    Lab Results  Component Value Date   SARSCOV2NAA NOT DETECTED 10/21/2018    Prone/Incentive Spirometry: encouraged  incentive spirometry use 3-4/hour.  COPD: Appears stable-no wheezing-continue steroids, and inhaler regimen.  CKD stage IIIb: Creatinine close to usual baseline-continue to monitor closely.  HTN: BP stable-continue Coreg and Entresto  HLD: Continue statin  GERD: Continue PPI  History  of seizure disorder: Continue phenobarbital  BPH: Stable-continue Flomax and Proscar  History of chronic lower extremity edema: Compressive wrappings in place-ongoing diuresis with IV Lasix and oral Aldactone.  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   Weight as of this encounter: 113.9 kg.   ABG: No results found for: PHART, PCO2ART, PO2ART, HCO3, TCO2, ACIDBASEDEF, O2SAT  Vent Settings: N/A   Condition - Guarded  Family Communication  :  Daughter-Ashley-updated over the phone 4/14  Code Status :  Full Code  Diet :  Diet Order            Diet Heart Room service appropriate? Yes; Fluid consistency: Thin  Diet effective now               Disposition Plan  :  Remain hospitalized  Barriers to discharge: Hypoxia requiring O2 supplementation/complete 5 days of IV Remdesivir  Antimicorbials  :    Anti-infectives (From admission, onward)   Start     Dose/Rate Route Frequency Ordered Stop   08/13/19 1000  remdesivir 100 mg in sodium chloride 0.9 % 100 mL IVPB     100 mg 200 mL/hr over 30 Minutes Intravenous Daily 08/12/19 1643 08/16/19 0828   08/12/19 1645  remdesivir 200 mg in sodium chloride 0.9% 250 mL IVPB     200 mg 580 mL/hr over 30 Minutes Intravenous Once 08/12/19 1643 08/12/19 1906      Inpatient Medications  Scheduled Meds: . vitamin C  500 mg Oral  Daily  . carvedilol  12.5 mg Oral BID WC  . finasteride  5 mg Oral Daily  . heparin  5,000 Units Subcutaneous Q8H  . insulin aspart  0-5 Units Subcutaneous QHS  . insulin aspart  0-9 Units Subcutaneous TID WC  . Ipratropium-Albuterol  1 puff Inhalation QID  . methylPREDNISolone (SOLU-MEDROL) injection  40 mg Intravenous Q12H  . mometasone-formoterol  2 puff Inhalation BID  . pantoprazole  40 mg Oral Daily  . PHENobarbital  64.8 mg Oral BID  . potassium chloride  40 mEq Oral BID  . rosuvastatin  20 mg Oral q1800  . sacubitril-valsartan  1 tablet Oral BID  . spironolactone  12.5 mg Oral Daily  . tamsulosin  0.4 mg Oral Daily  . umeclidinium bromide  18 puff Inhalation Daily  . zinc sulfate  220 mg Oral Daily   Continuous Infusions: . furosemide 160 mg (08/16/19 0842)   PRN Meds:.acetaminophen, guaiFENesin-dextromethorphan, ondansetron **OR** ondansetron (ZOFRAN) IV, sodium chloride   Time Spent in minutes  25  See all Orders from today for further details   Oren Binet M.D on 08/16/2019 at 11:29 AM  To page go to [email or web address] - use universal password  [facility] -  Office  [phone]    Objective:   Vitals:   08/15/19 2014 08/15/19 2300 08/16/19 0415 08/16/19 0800  BP: 93/66  107/70   Pulse: 76 80 93 85  Resp: (!) 24 15 (!) 22 20  Temp: 98.5 F (36.9 C)  98.5 F (36.9 C)   TempSrc: Oral  Oral   SpO2: 90% 91% 92% 98%  Weight:      Height:        Wt Readings from Last 3 Encounters:  08/12/19 113.9 kg  08/10/19 113.9 kg  10/22/18 108.8 kg     Intake/Output Summary (  Last 24 hours) at 08/16/2019 1129 Last data filed at 08/16/2019 0700 Gross per 24 hour  Intake 813 ml  Output 3025 ml  Net -2212 ml     Physical Exam Gen Exam:Alert awake-not in any distress HEENT:atraumatic, normocephalic Chest: B/L rales+ CVS:S1S2 regular Abdomen:soft non tender, non distended Extremities:++ edema Neurology: Non focal Skin: no rash   Data Review:     CBC Recent Labs  Lab 08/12/19 1543 08/13/19 0231 08/14/19 0252 08/15/19 0256 08/16/19 0253  WBC 4.6 4.0 4.3 8.2 6.2  HGB 15.7 15.1 15.4 16.0 16.4  HCT 48.3 48.3 49.6 50.3 51.3  PLT 111* 101* 99* 100* 104*  MCV 94.7 95.3 95.9 95.3 93.8  MCH 30.8 29.8 29.8 30.3 30.0  MCHC 32.5 31.3 31.0 31.8 32.0  RDW 14.2 14.2 13.9 13.7 13.8  LYMPHSABS 1.2 0.6* 0.5* 0.7 0.7  MONOABS 0.9 0.3 0.2 0.9 0.7  EOSABS 0.0 0.0 0.0 0.0 0.0  BASOSABS 0.0 0.0 0.0 0.0 0.0    Chemistries  Recent Labs  Lab 08/12/19 1543 08/13/19 0231 08/14/19 0252 08/15/19 0256 08/16/19 0253  NA 141 145 142 142 140  K 3.6 3.7 4.0 3.6 3.8  CL 98 98 95* 95* 94*  CO2 32 36* 37* 38* 35*  GLUCOSE 126* 163* 159* 165* 143*  BUN 40* 35* 30* 35* 34*  CREATININE 1.64* 1.61* 1.30* 1.30* 1.19  CALCIUM 8.6* 8.7* 8.8* 8.8* 8.4*  MG  --  2.1 2.2 2.1 2.1  AST 35 32 35 34 23  ALT 35 33 38 39 28  ALKPHOS 62 67 65 64 63  BILITOT 0.7 0.6 0.7 0.7 0.9   ------------------------------------------------------------------------------------------------------------------ Recent Labs    08/15/19 0256  CHOL 134  HDL 35*  LDLCALC 77  TRIG 110  CHOLHDL 3.8    Lab Results  Component Value Date   HGBA1C 5.4 10/05/2012   ------------------------------------------------------------------------------------------------------------------ No results for input(s): TSH, T4TOTAL, T3FREE, THYROIDAB in the last 72 hours.  Invalid input(s): FREET3 ------------------------------------------------------------------------------------------------------------------ No results for input(s): VITAMINB12, FOLATE, FERRITIN, TIBC, IRON, RETICCTPCT in the last 72 hours.  Coagulation profile No results for input(s): INR, PROTIME in the last 168 hours.  Recent Labs    08/15/19 0256 08/16/19 0253  DDIMER 0.49 0.49    Cardiac Enzymes No results for input(s): CKMB, TROPONINI, MYOGLOBIN in the last 168 hours.  Invalid input(s):  CK ------------------------------------------------------------------------------------------------------------------    Component Value Date/Time   BNP 1,462.8 (H) 08/14/2019 0252    Micro Results Recent Results (from the past 240 hour(s))  Blood Culture (routine x 2)     Status: None (Preliminary result)   Collection Time: 08/12/19  4:56 PM   Specimen: BLOOD  Result Value Ref Range Status   Specimen Description BLOOD SITE NOT SPECIFIED  Final   Special Requests   Final    BOTTLES DRAWN AEROBIC AND ANAEROBIC Blood Culture adequate volume   Culture   Final    NO GROWTH 3 DAYS Performed at [facility], [address] [place], [place] [address]    Report Status PENDING  Incomplete  Blood Culture (routine x 2)     Status: None (Preliminary result)   Collection Time: 08/12/19  5:02 PM   Specimen: BLOOD  Result Value Ref Range Status   Specimen Description BLOOD LEFT ANTECUBITAL  Final   Special Requests   Final    BOTTLES DRAWN AEROBIC AND ANAEROBIC Blood Culture adequate volume   Culture   Final    NO GROWTH 3 DAYS Performed at  [facility], [place] [address] [place], [place] [address]    Report Status PENDING  Incomplete    Radiology Reports DG Chest Port 1 View  Result Date: 08/12/2019 CLINICAL DATA:  Shortness of breath, COVID positive EXAM: PORTABLE CHEST 1 VIEW COMPARISON:  06/29/2018 FINDINGS: Mild left basilar opacity, likely atelectasis. Lungs otherwise clear in this patient with known COVID. No pleural effusion or pneumothorax. Cardiomegaly.  Mild thoracic aortic atherosclerosis. IMPRESSION: Mild left basilar opacity, likely atelectasis. Lungs otherwise clear in this patient with known COVID. Cardiomegaly.  Thoracic aortic atherosclerosis. Electronically Signed   By: Julian Hy M.D.   On: 08/12/2019 16:08   ECHOCARDIOGRAM COMPLETE  Result Date: 08/13/2019    ECHOCARDIOGRAM REPORT   Patient Name:   Ricky Hayden Date of Exam: 08/13/2019 Medical Rec #:   [id number]     Height:       68.0 in Accession #:    [id number]    Weight:       251.0 lb Date of Birth:  Jun 21, 1945    BSA:          2.251 m Patient Age:    38 years      BP:           118/87 mmHg Patient Gender: M             HR:           88 bpm. Exam Location:  Inpatient Procedure: 2D Echo, Cardiac Doppler, Color Doppler and Intracardiac            Opacification Agent Indications:    I50.9* Heart failure (unspecified)  History:        Patient has prior history of Echocardiogram examinations, most                 recent 04/15/2015. CHF, CAD, COPD; Risk Factors:Hypertension and                 Dyslipidemia. Covid 19 positive. Hypoxia.  Sonographer:    Roseanna Rainbow RDCS Referring Phys: [id number] [place]  Sonographer Comments: Technically difficult study due to poor echo windows. Image acquisition challenging due to patient body habitus. IMPRESSIONS  1. Left ventricular ejection fraction, by estimation, is <20%. The left ventricle has severely decreased function. The left ventricle demonstrates global hypokinesis. The left ventricular internal cavity size was mildly dilated. There is mild concentric  left ventricular hypertrophy. Left ventricular diastolic function could not be evaluated. Elevated left ventricular end-diastolic pressure.  2. Right ventricular systolic function is severely reduced. The right ventricular size is severely enlarged. There is moderately elevated pulmonary artery systolic pressure.  3. The mitral valve is normal in structure. Mild mitral valve regurgitation. No evidence of mitral stenosis.  4. The aortic valve is tricuspid. Aortic valve regurgitation is trivial. Mild to moderate aortic valve sclerosis/calcification is present, without any evidence of aortic stenosis.  5. Aortic dilatation noted. There is mild dilatation of the aortic root and of the ascending aorta measuring 41 mm and 53mm respectively.  6. The inferior vena cava is dilated in size with <50% respiratory variability,  suggesting right atrial pressure of 15 mmHg.  7. Left atrial size was severely dilated.  8. Right atrial size was mildly dilated. FINDINGS  Left Ventricle: Left ventricular ejection fraction, by estimation, is <20%. The left ventricle has severely decreased function. The left ventricle demonstrates global hypokinesis. The left ventricular internal cavity size was mildly dilated. There is mild concentric left ventricular hypertrophy.  Abnormal (paradoxical) septal motion, consistent with left bundle branch block. Left ventricular diastolic function could not be evaluated. Elevated left ventricular end-diastolic pressure. Right Ventricle: The right ventricular size is severely enlarged. No increase in right ventricular wall thickness. Right ventricular systolic function is severely reduced. There is moderately elevated pulmonary artery systolic pressure. The tricuspid regurgitant velocity is 3.01 m/s, and with an assumed right atrial pressure of 15 mmHg, the estimated right ventricular systolic pressure is 70.4 mmHg. Left Atrium: Left atrial size was severely dilated. Right Atrium: Right atrial size was mildly dilated. Pericardium: There is no evidence of pericardial effusion. Mitral Valve: The mitral valve is normal in structure. Normal mobility of the mitral valve leaflets. Moderate mitral annular calcification. Mild mitral valve regurgitation. No evidence of mitral valve stenosis. Tricuspid Valve: The tricuspid valve is normal in structure. Tricuspid valve regurgitation is mild . No evidence of tricuspid stenosis. Aortic Valve: The aortic valve is tricuspid. Aortic valve regurgitation is trivial. Mild to moderate aortic valve sclerosis/calcification is present, without any evidence of aortic stenosis. Mild to moderate aortic valve annular calcification. Pulmonic Valve: The pulmonic valve was normal in structure. Pulmonic valve regurgitation is mild. No evidence of pulmonic stenosis. Aorta: The aortic root is normal  in size and structure and aortic dilatation noted. 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 Venous: The inferior vena cava is dilated in size with less than 50% respiratory variability, suggesting right atrial pressure of 15 mmHg. IAS/Shunts: No atrial level shunt detected by color flow Doppler.  LEFT VENTRICLE PLAX 2D LVIDd:         6.10 cm      Diastology LVIDs:         5.70 cm      LV e' lateral:   4.79 cm/s LV PW:         1.70 cm      LV E/e' lateral: 18.2 LV IVS:        1.20 cm      LV e' medial:    3.26 cm/s LVOT diam:     2.30 cm      LV E/e' medial:  26.8 LV SV:         38 LV SV Index:   17 LVOT Area:     4.15 cm  LV Volumes (MOD) LV vol d, MOD A2C: 216.0 ml LV vol d, MOD A4C: 186.0 ml LV vol s, MOD A2C: 188.0 ml LV vol s, MOD A4C: 173.0 ml LV SV MOD A2C:     28.0 ml LV SV MOD A4C:     186.0 ml LV SV MOD BP:      18.8 ml RIGHT VENTRICLE            IVC RV S prime:     7.07 cm/s  IVC diam: 2.80 cm TAPSE (M-mode): 1.1 cm LEFT ATRIUM              Index       RIGHT ATRIUM           Index LA diam:        5.00 cm  2.22 cm/m  RA Area:     24.20 cm LA Vol (A2C):   113.0 ml 50.20 ml/m RA Volume:   83.40 ml  37.05 ml/m LA Vol (A4C):   112.0 ml 49.76 ml/m LA Biplane Vol: 114.0 ml 50.65 ml/m  AORTIC VALVE  PULMONIC VALVE LVOT Vmax:   69.80 cm/s  PV Vmax:          2.25 m/s LVOT Vmean:  49.900 cm/s PV Peak grad:     20.2 mmHg LVOT VTI:    0.092 m     PR End Diast Vel: 2.31 msec  AORTA Ao Root diam: 4.10 cm Ao Asc diam:  4.10 cm MITRAL VALVE               TRICUSPID VALVE MV Area (PHT): 3.17 cm    TR Peak grad:   36.2 mmHg MV Decel Time: 239 msec    TR Vmax:        301.00 cm/s MV E velocity: 87.40 cm/s                            SHUNTS                            Systemic VTI:  0.09 m                            Systemic Diam: 2.30 cm Fransico Him MD Electronically signed by Fransico Him MD Signature Date/Time: 08/13/2019/3:03:36 PM    Final

## 2019-08-17 ENCOUNTER — Inpatient Hospital Stay (HOSPITAL_COMMUNITY): Payer: No Typology Code available for payment source

## 2019-08-17 DIAGNOSIS — U071 COVID-19: Secondary | ICD-10-CM | POA: Diagnosis not present

## 2019-08-17 DIAGNOSIS — J9601 Acute respiratory failure with hypoxia: Secondary | ICD-10-CM | POA: Diagnosis not present

## 2019-08-17 LAB — CBC WITH DIFFERENTIAL/PLATELET
Abs Immature Granulocytes: 0.05 10*3/uL (ref 0.00–0.07)
Basophils Absolute: 0 10*3/uL (ref 0.0–0.1)
Basophils Relative: 0 %
Eosinophils Absolute: 0 10*3/uL (ref 0.0–0.5)
Eosinophils Relative: 0 %
HCT: 52.3 % — ABNORMAL HIGH (ref 39.0–52.0)
Hemoglobin: 16.8 g/dL (ref 13.0–17.0)
Immature Granulocytes: 1 %
Lymphocytes Relative: 13 %
Lymphs Abs: 1 10*3/uL (ref 0.7–4.0)
MCH: 29.8 pg (ref 26.0–34.0)
MCHC: 32.1 g/dL (ref 30.0–36.0)
MCV: 92.9 fL (ref 80.0–100.0)
Monocytes Absolute: 0.9 10*3/uL (ref 0.1–1.0)
Monocytes Relative: 12 %
Neutro Abs: 5.7 10*3/uL (ref 1.7–7.7)
Neutrophils Relative %: 74 %
Platelets: 129 10*3/uL — ABNORMAL LOW (ref 150–400)
RBC: 5.63 MIL/uL (ref 4.22–5.81)
RDW: 13.7 % (ref 11.5–15.5)
WBC: 7.6 10*3/uL (ref 4.0–10.5)
nRBC: 0 % (ref 0.0–0.2)

## 2019-08-17 LAB — COMPREHENSIVE METABOLIC PANEL
ALT: 29 U/L (ref 0–44)
AST: 21 U/L (ref 15–41)
Albumin: 3.2 g/dL — ABNORMAL LOW (ref 3.5–5.0)
Alkaline Phosphatase: 59 U/L (ref 38–126)
Anion gap: 9 (ref 5–15)
BUN: 35 mg/dL — ABNORMAL HIGH (ref 8–23)
CO2: 38 mmol/L — ABNORMAL HIGH (ref 22–32)
Calcium: 9 mg/dL (ref 8.9–10.3)
Chloride: 93 mmol/L — ABNORMAL LOW (ref 98–111)
Creatinine, Ser: 1.46 mg/dL — ABNORMAL HIGH (ref 0.61–1.24)
GFR calc Af Amer: 54 mL/min — ABNORMAL LOW (ref >60)
GFR calc non Af Amer: 46 mL/min — ABNORMAL LOW (ref >60)
Glucose, Bld: 148 mg/dL — ABNORMAL HIGH (ref 70–99)
Potassium: 4.2 mmol/L (ref 3.5–5.1)
Sodium: 140 mmol/L (ref 135–145)
Total Bilirubin: 1 mg/dL (ref 0.3–1.2)
Total Protein: 6.7 g/dL (ref 6.5–8.1)

## 2019-08-17 LAB — GLUCOSE, CAPILLARY
Glucose-Capillary: 110 mg/dL — ABNORMAL HIGH (ref 70–99)
Glucose-Capillary: 219 mg/dL — ABNORMAL HIGH (ref 70–99)
Glucose-Capillary: 127 mg/dL — ABNORMAL HIGH (ref 70–99)
Glucose-Capillary: 158 mg/dL — ABNORMAL HIGH (ref 70–99)

## 2019-08-17 LAB — CULTURE, BLOOD (ROUTINE X 2)
Culture: NO GROWTH
Culture: NO GROWTH
Special Requests: ADEQUATE
Special Requests: ADEQUATE

## 2019-08-17 LAB — C-REACTIVE PROTEIN: CRP: 1.1 mg/dL — ABNORMAL HIGH (ref <1.0)

## 2019-08-17 LAB — MAGNESIUM: Magnesium: 2.1 mg/dL (ref 1.7–2.4)

## 2019-08-17 LAB — D-DIMER, QUANTITATIVE: D-Dimer, Quant: 0.55 ug/mL-FEU — ABNORMAL HIGH (ref 0.00–0.50)

## 2019-08-17 MED ORDER — CARVEDILOL 25 MG PO TABS
25.0000 mg | ORAL_TABLET | Freq: Two times a day (BID) | ORAL | Status: DC
  Administered 2019-08-17: 25 mg via ORAL
  Filled 2019-08-17: qty 1

## 2019-08-17 MED ORDER — TORSEMIDE 20 MG PO TABS
20.0000 mg | ORAL_TABLET | Freq: Every day | ORAL | Status: DC
  Administered 2019-08-17 – 2019-08-19 (×3): 20 mg via ORAL
  Filled 2019-08-17 (×3): qty 1

## 2019-08-17 MED ORDER — METHYLPREDNISOLONE SODIUM SUCC 40 MG IJ SOLR
20.0000 mg | Freq: Two times a day (BID) | INTRAMUSCULAR | Status: DC
  Administered 2019-08-17 – 2019-08-18 (×2): 20 mg via INTRAVENOUS
  Filled 2019-08-17 (×2): qty 1

## 2019-08-17 MED ORDER — CARVEDILOL 12.5 MG PO TABS
12.5000 mg | ORAL_TABLET | Freq: Two times a day (BID) | ORAL | Status: DC
  Administered 2019-08-18 – 2019-08-19 (×3): 12.5 mg via ORAL
  Filled 2019-08-17 (×4): qty 1

## 2019-08-17 MED ORDER — SACUBITRIL-VALSARTAN 24-26 MG PO TABS
1.0000 | ORAL_TABLET | Freq: Two times a day (BID) | ORAL | Status: DC
  Administered 2019-08-18 – 2019-08-19 (×3): 1 via ORAL
  Filled 2019-08-17 (×4): qty 1

## 2019-08-17 MED ORDER — SACUBITRIL-VALSARTAN 49-51 MG PO TABS
1.0000 | ORAL_TABLET | Freq: Two times a day (BID) | ORAL | Status: DC
  Filled 2019-08-17: qty 1

## 2019-08-17 NOTE — Progress Notes (Signed)
Cardiology Progress Note  Patient ID: Ricky Hayden MRN: [id number] DOB: 05/06/1945 Date of Encounter: 08/17/2019  Primary Cardiologist: No primary care provider on file.  Subjective  Appears euvolemic. Cr bumped. Bradycardia noted.   ROS:  All other ROS reviewed and negative. Pertinent positives noted in the HPI.     Inpatient Medications  Scheduled Meds: . vitamin C  500 mg Oral Daily  . carvedilol  12.5 mg Oral BID WC  . finasteride  5 mg Oral Daily  . heparin  5,000 Units Subcutaneous Q8H  . insulin aspart  0-5 Units Subcutaneous QHS  . insulin aspart  0-9 Units Subcutaneous TID WC  . Ipratropium-Albuterol  1 puff Inhalation QID  . methylPREDNISolone (SOLU-MEDROL) injection  40 mg Intravenous Q12H  . mometasone-formoterol  2 puff Inhalation BID  . pantoprazole  40 mg Oral Daily  . PHENobarbital  64.8 mg Oral BID  . rosuvastatin  20 mg Oral q1800  . sacubitril-valsartan  1 tablet Oral BID  . spironolactone  12.5 mg Oral Daily  . tamsulosin  0.4 mg Oral Daily  . umeclidinium bromide  18 puff Inhalation Daily  . zinc sulfate  220 mg Oral Daily   Continuous Infusions:  PRN Meds: acetaminophen, guaiFENesin-dextromethorphan, ondansetron **OR** ondansetron (ZOFRAN) IV, sodium chloride   Vital Signs   Vitals:   08/17/19 0502 08/17/19 0700 08/17/19 0903 08/17/19 0932  BP: 119/77     Pulse: 93   70  Resp: (!) 25  20 20   Temp: 98.7 F (37.1 C)     TempSrc: Oral     SpO2: (!) 86%  92% 95%  Weight:  108.6 kg    Height:        Intake/Output Summary (Last 24 hours) at 08/17/2019 1008 Last data filed at 08/16/2019 1526 Gross per 24 hour  Intake 66 ml  Output 900 ml  Net -834 ml   Last 3 Weights 08/17/2019 08/12/2019 08/10/2019  Weight (lbs) 239 lb 8 oz 251 lb 251 lb  Weight (kg) 108.636 kg 113.853 kg 113.853 kg      Telemetry  Overnight telemetry shows NSR 70s with PVCs, with episode of brief sinus bradycardia 30s, which I personally reviewed.   ECG  The most  recent ECG shows NSR LBBB (170 ms), which I personally reviewed.   Physical Exam   Vitals:   08/17/19 0502 08/17/19 0700 08/17/19 0903 08/17/19 0932  BP: 119/77     Pulse: 93   70  Resp: (!) 25  20 20   Temp: 98.7 F (37.1 C)     TempSrc: Oral     SpO2: (!) 86%  92% 95%  Weight:  108.6 kg    Height:         Intake/Output Summary (Last 24 hours) at 08/17/2019 1008 Last data filed at 08/16/2019 1526 Gross per 24 hour  Intake 66 ml  Output 900 ml  Net -834 ml    Last 3 Weights 08/17/2019 08/12/2019 08/10/2019  Weight (lbs) 239 lb 8 oz 251 lb 251 lb  Weight (kg) 108.636 kg 113.853 kg 113.853 kg    Body mass index is 36.42 kg/m.   General: Well nourished, well developed, in no acute distress Head: Atraumatic, normal size  Eyes: PEERLA, EOMI  Neck: Supple, JVD 5-7 cm H20 Endocrine: No thryomegaly Cardiac: Normal S1, S2; RRR; no murmurs, rubs, or gallops Lungs: rhonchi   Abd: Soft, nontender, no hepatomegaly  Ext: trace LE edema  Musculoskeletal: No deformities, BUE and BLE strength normal and  equal Skin: Warm and dry, no rashes   Neuro: Alert and oriented to person, place, time, and situation, CNII-XII grossly intact, no focal deficits  Psych: Normal mood and affect   Labs  High Sensitivity Troponin:  No results for input(s): TROPONINIHS in the last 720 hours.   Cardiac EnzymesNo results for input(s): TROPONINI in the last 168 hours. No results for input(s): TROPIPOC in the last 168 hours.  Chemistry Recent Labs  Lab 08/15/19 0256 08/16/19 0253 08/17/19 0253  NA 142 140 140  K 3.6 3.8 4.2  CL 95* 94* 93*  CO2 38* 35* 38*  GLUCOSE 165* 143* 148*  BUN 35* 34* 35*  CREATININE 1.30* 1.19 1.46*  CALCIUM 8.8* 8.4* 9.0  PROT 6.5 6.2* 6.7  ALBUMIN 3.1* 3.0* 3.2*  AST 34 23 21  ALT 39 28 29  ALKPHOS 64 63 59  BILITOT 0.7 0.9 1.0  GFRNONAA 53* 59* 46*  GFRAA >60 >60 54*  ANIONGAP 9 11 9     Hematology Recent Labs  Lab 08/15/19 0256 08/16/19 0253 08/17/19 0253  WBC  8.2 6.2 7.6  RBC 5.28 5.47 5.63  HGB 16.0 16.4 16.8  HCT 50.3 51.3 52.3*  MCV 95.3 93.8 92.9  MCH 30.3 30.0 29.8  MCHC 31.8 32.0 32.1  RDW 13.7 13.8 13.7  PLT 100* 104* 129*   BNP Recent Labs  Lab 08/14/19 0252  BNP 1,462.8*    DDimer  Recent Labs  Lab 08/15/19 0256 08/16/19 0253 08/17/19 0253  DDIMER 0.49 0.49 0.55*     Radiology  DG CHEST PORT 1 VIEW  Result Date: 08/17/2019 CLINICAL DATA:  Shortness of breath EXAM: PORTABLE CHEST 1 VIEW COMPARISON:  08/12/2019 FINDINGS: Cardiomegaly. Bibasilar atelectasis. No effusions or edema. No acute bony abnormality. IMPRESSION: Cardiomegaly, bibasilar atelectasis. Electronically Signed   By: Rolm Baptise M.D.   On: 08/17/2019 07:52    Cardiac Studies  TTE 08/13/2019 1. Left ventricular ejection fraction, by estimation, is <20%. The left  ventricle has severely decreased function. The left ventricle demonstrates  global hypokinesis. The left ventricular internal cavity size was mildly  dilated. There is mild concentric  left ventricular hypertrophy. Left ventricular diastolic function could  not be evaluated. Elevated left ventricular end-diastolic pressure.  2. Right ventricular systolic function is severely reduced. The right  ventricular size is severely enlarged. There is moderately elevated  pulmonary artery systolic pressure.  3. The mitral valve is normal in structure. Mild mitral valve  regurgitation. No evidence of mitral stenosis.  4. The aortic valve is tricuspid. Aortic valve regurgitation is trivial.  Mild to moderate aortic valve sclerosis/calcification is present, without  any evidence of aortic stenosis.  5. Aortic dilatation noted. There is mild dilatation of the aortic root  and of the ascending aorta measuring 41 mm and 49mm respectively.  6. The inferior vena cava is dilated in size with <50% respiratory  variability, suggesting right atrial pressure of 15 mmHg.  7. Left atrial size was severely  dilated.  8. Right atrial size was mildly dilated.   Patient Profile  Ricky Hayden is a 74 y.o. male with COPD, systolic HF, brain abscess in 1995, seizure disorder, gout HTN, CKD III admitted 4/10 with covid PNA and decompensated HF.   Assessment & Plan   1. Acute hypoxic respiratory failure -2/2 covid and acute decompensated systolic HF -continue current covid medications -steroid not ideal in HF but no real choice   2. 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 LVOT VTI 9 cm.  -volume status appears euvolemic today -will start torsemide 20 mg daily  -reduce coreg back to 12.5 mg BID -increase entresto 49-51 mg BID -continue aldactone 12.5 mg QD  -will need to be considered for LHC, BiV ICD as outpatient. Apparently was not on GDMT as care was at [facility] and did not tolerate medications per their report  -tolerating meds well without issues   3. Bradycardia -transient brady into the 30s. No symptoms. Appears sinus with PACs. Will reduce coreg back to 12.5 mg BID. Will discuss with EP.   4. HTN, with hypotension on admission -hypotension resolved   5. AKI on CKD stage III -continues to improve with diuresis   6. Hyperlipidemia  -switched to crestor 20 mg QD   7. Prolonged QTc  -LBBB 170 ms. No issues   For questions or updates, please contact [place] Please consult [email or web address] for contact info under   Time Spent with Patient: I have spent a total of 25 minutes with patient reviewing hospital notes, telemetry, EKGs, labs and examining the patient as well as establishing an assessment and plan that was discussed with the patient.  > 50% of time was spent in direct patient care.    Signed, Addison Naegeli. Audie Box, [place]  08/17/2019 10:08 AM

## 2019-08-17 NOTE — Progress Notes (Signed)
PROGRESS NOTE                                                                                                                                                                                                             Patient Demographics:    Ricky Hayden, is a 74 y.o. male, DOB - 07-05-1945, DDU:[id number]  Outpatient Primary MD for the patient is Patient, No Pcp Per   Admit date - 08/12/2019   LOS - 5  Chief Complaint  Patient presents with  . Shortness of Breath       Brief Narrative: Patient is a 74 y.o. male with PMHx of chronic systolic heart failure, CAD, BPH, GERD, CKD stage IIIb, COPD hypotension and acute hypoxic respiratory failure in the setting of decompensated systolic heart failure and COVID-19 pneumonia.  See below for further details.  Significant Events: 4/10>> admit to [facility] 4/11>> EF < 20%, severe RV systolic dysfunction   [id number] medications: Steroids: 4/10>> Remdesivir: 4/10>> 4/14  Antibiotics: None  Microbiology data: 4/10>> Blood cultures: Negative  DVT prophylaxis: SQ heparin  Procedures: None  Consults: Cardiology    Subjective:    Ricky Hayden today feels worn out today-he did not get some sleep last night.   Assessment  & Plan :   Acute Hypoxic Resp Failure due to Covid 19 Viral pneumonia decompensated systolic heart failure: Improving-continue to titrate down FiO2 as tolerated.  CRP decreasing-start tapering down steroids.  Volume status is markedly improved-cardiology following-transition to oral Lasix-remains on Aldactone, Coreg and Entresto.  Cardiology planning on further invasive work-up including LHC to be done in the outpatient setting once patient recovers from COVID-19 infection.  Fever: afebrile  O2 requirements:  SpO2: 95 % O2 Flow Rate (L/min): 2.5 L/min   COVID-19 Labs: Recent Labs    08/15/19 0256 08/16/19 0253 08/17/19 0253  DDIMER 0.49 0.49  0.55*  CRP 1.4* 1.7* 1.1*       Component Value Date/Time   BNP 1,462.8 (H) 08/14/2019 0252    Recent Labs  Lab 08/12/19 1543  PROCALCITON 0.15    Lab Results  Component Value Date   SARSCOV2NAA NOT DETECTED 10/21/2018    Prone/Incentive Spirometry: encouraged  incentive spirometry use 3-4/hour.  Sinus bradycardia: Brief run of sinus bradycardia down to the 30s on 4/15 a.m.-cardiology has decreased dosage of beta-blocker-defer further to cardiology.  COPD: Appears stable-no wheezing-continue steroids, and inhaler regimen.  CKD stage IIIb: Creatinine close to usual baseline-continue to monitor closely.  HTN: BP stable-continue Coreg and Entresto  HLD: Continue statin  GERD: Continue PPI  History of seizure disorder: Continue phenobarbital  BPH: Stable-continue Flomax and Proscar  History of chronic lower extremity edema: Compressive wrappings in place-ongoing diuresis with IV Lasix and oral Aldactone.  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   Weight as of this encounter: 108.6 kg.   ABG: No results found for: PHART, PCO2ART, PO2ART, HCO3, TCO2, ACIDBASEDEF, O2SAT  Vent Settings: N/A   Condition - Guarded  Family Communication  :  Daughter-Ricky Hayden-updated over the phone 4/15  Code Status :  Full Code  Diet :  Diet Order            Diet Heart Room service appropriate? Yes; Fluid consistency: Thin  Diet effective now               Disposition Plan  :  Remain hospitalized  Barriers to discharge: Hypoxia requiring O2 supplementation/complete 5 days of IV Remdesivir  Antimicorbials  :    Anti-infectives (From admission, onward)   Start     Dose/Rate Route Frequency Ordered Stop   08/13/19 1000  remdesivir 100 mg in sodium chloride 0.9 % 100 mL IVPB     100 mg 200 mL/hr over 30 Minutes  Intravenous Daily 08/12/19 1643 08/16/19 0828   08/12/19 1645  remdesivir 200 mg in sodium chloride 0.9% 250 mL IVPB     200 mg 580 mL/hr over 30 Minutes Intravenous Once 08/12/19 1643 08/12/19 1906      Inpatient Medications  Scheduled Meds: . vitamin C  500 mg Oral Daily  . carvedilol  12.5 mg Oral BID WC  . finasteride  5 mg Oral Daily  . heparin  5,000 Units Subcutaneous Q8H  . insulin aspart  0-5 Units Subcutaneous QHS  . insulin aspart  0-9 Units Subcutaneous TID WC  . Ipratropium-Albuterol  1 puff Inhalation QID  . methylPREDNISolone (SOLU-MEDROL) injection  40 mg Intravenous Q12H  . mometasone-formoterol  2 puff Inhalation BID  . pantoprazole  40 mg Oral Daily  . PHENobarbital  64.8 mg Oral BID  . rosuvastatin  20 mg Oral q1800  . sacubitril-valsartan  1 tablet Oral BID  . spironolactone  12.5 mg Oral Daily  . tamsulosin  0.4 mg Oral Daily  . umeclidinium bromide  18 puff Inhalation Daily  . zinc sulfate  220 mg Oral Daily   Continuous Infusions:  PRN Meds:.acetaminophen, guaiFENesin-dextromethorphan, ondansetron **OR** ondansetron (ZOFRAN) IV, sodium chloride   Time Spent in minutes  25  See all Orders from today for further details   Oren Binet M.D on 08/17/2019 at 11:35 AM  To page go to [email or web address] - use universal password  [facility] -  Office  [phone]    Objective:   Vitals:   08/17/19 0502 08/17/19 0700 08/17/19 0903 08/17/19 0932  BP: 119/77     Pulse: 93   70  Resp: (!) 25  20 20   Temp: 98.7 F (37.1 C)     TempSrc: Oral     SpO2: (!) 86%  92% 95%  Weight:  108.6 kg    Height:        Wt Readings from Last 3  Encounters:  08/17/19 108.6 kg  08/10/19 113.9 kg  10/22/18 108.8 kg     Intake/Output Summary (Last 24 hours) at 08/17/2019 1135 Last data filed at 08/16/2019 1526 Gross per 24 hour  Intake 66 ml  Output 900 ml  Net -834 ml     Physical Exam Gen Exam:Alert awake-not in any distress HEENT:atraumatic,  normocephalic Chest: B/L clear to auscultation anteriorly CVS:S1S2 regular Abdomen:soft non tender, non distended Extremities:trace edema-b/l compressive stockings Neurology: Non focal Skin: no rash   Data Review:    CBC Recent Labs  Lab 08/13/19 0231 08/14/19 0252 08/15/19 0256 08/16/19 0253 08/17/19 0253  WBC 4.0 4.3 8.2 6.2 7.6  HGB 15.1 15.4 16.0 16.4 16.8  HCT 48.3 49.6 50.3 51.3 52.3*  PLT 101* 99* 100* 104* 129*  MCV 95.3 95.9 95.3 93.8 92.9  MCH 29.8 29.8 30.3 30.0 29.8  MCHC 31.3 31.0 31.8 32.0 32.1  RDW 14.2 13.9 13.7 13.8 13.7  LYMPHSABS 0.6* 0.5* 0.7 0.7 1.0  MONOABS 0.3 0.2 0.9 0.7 0.9  EOSABS 0.0 0.0 0.0 0.0 0.0  BASOSABS 0.0 0.0 0.0 0.0 0.0    Chemistries  Recent Labs  Lab 08/13/19 0231 08/14/19 0252 08/15/19 0256 08/16/19 0253 08/17/19 0253  NA 145 142 142 140 140  K 3.7 4.0 3.6 3.8 4.2  CL 98 95* 95* 94* 93*  CO2 36* 37* 38* 35* 38*  GLUCOSE 163* 159* 165* 143* 148*  BUN 35* 30* 35* 34* 35*  CREATININE 1.61* 1.30* 1.30* 1.19 1.46*  CALCIUM 8.7* 8.8* 8.8* 8.4* 9.0  MG 2.1 2.2 2.1 2.1 2.1  AST 32 35 34 23 21  ALT 33 38 39 28 29  ALKPHOS 67 65 64 63 59  BILITOT 0.6 0.7 0.7 0.9 1.0   ------------------------------------------------------------------------------------------------------------------ Recent Labs    08/15/19 0256  CHOL 134  HDL 35*  LDLCALC 77  TRIG 110  CHOLHDL 3.8    Lab Results  Component Value Date   HGBA1C 5.4 10/05/2012   ------------------------------------------------------------------------------------------------------------------ No results for input(s): TSH, T4TOTAL, T3FREE, THYROIDAB in the last 72 hours.  Invalid input(s): FREET3 ------------------------------------------------------------------------------------------------------------------ No results for input(s): VITAMINB12, FOLATE, FERRITIN, TIBC, IRON, RETICCTPCT in the last 72 hours.  Coagulation profile No results for input(s): INR, PROTIME in  the last 168 hours.  Recent Labs    08/16/19 0253 08/17/19 0253  DDIMER 0.49 0.55*    Cardiac Enzymes No results for input(s): CKMB, TROPONINI, MYOGLOBIN in the last 168 hours.  Invalid input(s): CK ------------------------------------------------------------------------------------------------------------------    Component Value Date/Time   BNP 1,462.8 (H) 08/14/2019 0252    Micro Results Recent Results (from the past 240 hour(s))  Blood Culture (routine x 2)     Status: None (Preliminary result)   Collection Time: 08/12/19  4:56 PM   Specimen: BLOOD  Result Value Ref Range Status   Specimen Description BLOOD SITE NOT SPECIFIED  Final   Special Requests   Final    BOTTLES DRAWN AEROBIC AND ANAEROBIC Blood Culture adequate volume   Culture NO GROWTH 4 DAYS  Final   Report Status PENDING  Incomplete  Blood Culture (routine x 2)     Status: None (Preliminary result)   Collection Time: 08/12/19  5:02 PM   Specimen: BLOOD  Result Value Ref Range Status   Specimen Description BLOOD LEFT ANTECUBITAL  Final   Special Requests   Final    BOTTLES DRAWN AEROBIC AND ANAEROBIC Blood Culture adequate volume   Culture NO GROWTH 4 DAYS  Final  Report Status PENDING  Incomplete    Radiology Reports DG CHEST PORT 1 VIEW  Result Date: 08/17/2019 CLINICAL DATA:  Shortness of breath EXAM: PORTABLE CHEST 1 VIEW COMPARISON:  08/12/2019 FINDINGS: Cardiomegaly. Bibasilar atelectasis. No effusions or edema. No acute bony abnormality. IMPRESSION: Cardiomegaly, bibasilar atelectasis. Electronically Signed   By: Rolm Baptise M.D.   On: 08/17/2019 07:52   DG Chest Port 1 View  Result Date: 08/12/2019 CLINICAL DATA:  Shortness of breath, COVID positive EXAM: PORTABLE CHEST 1 VIEW COMPARISON:  06/29/2018 FINDINGS: Mild left basilar opacity, likely atelectasis. Lungs otherwise clear in this patient with known COVID. No pleural effusion or pneumothorax. Cardiomegaly.  Mild thoracic aortic  atherosclerosis. IMPRESSION: Mild left basilar opacity, likely atelectasis. Lungs otherwise clear in this patient with known COVID. Cardiomegaly.  Thoracic aortic atherosclerosis. Electronically Signed   By: Julian Hy M.D.   On: 08/12/2019 16:08   ECHOCARDIOGRAM COMPLETE  Result Date: 08/13/2019    ECHOCARDIOGRAM REPORT   Patient Name:   KHALFANI WEIDEMAN Date of Exam: 08/13/2019 Medical Rec #:  [id number]     Height:       68.0 in Accession #:    [id number]    Weight:       251.0 lb Date of Birth:  06-08-1945    BSA:          2.251 m Patient Age:    16 years      BP:           118/87 mmHg Patient Gender: M             HR:           88 bpm. Exam Location:  Inpatient Procedure: 2D Echo, Cardiac Doppler, Color Doppler and Intracardiac            Opacification Agent Indications:    I50.9* Heart failure (unspecified)  History:        Patient has prior history of Echocardiogram examinations, most                 recent 04/15/2015. CHF, CAD, COPD; Risk Factors:Hypertension and                 Dyslipidemia. Covid 19 positive. Hypoxia.  Sonographer:    Roseanna Rainbow RDCS Referring Phys: [id number] [place]  Sonographer Comments: Technically difficult study due to poor echo windows. Image acquisition challenging due to patient body habitus. IMPRESSIONS  1. Left ventricular ejection fraction, by estimation, is <20%. The left ventricle has severely decreased function. The left ventricle demonstrates global hypokinesis. The left ventricular internal cavity size was mildly dilated. There is mild concentric  left ventricular hypertrophy. Left ventricular diastolic function could not be evaluated. Elevated left ventricular end-diastolic pressure.  2. Right ventricular systolic function is severely reduced. The right ventricular size is severely enlarged. There is moderately elevated pulmonary artery systolic pressure.  3. The mitral valve is normal in structure. Mild mitral valve regurgitation. No evidence of mitral  stenosis.  4. The aortic valve is tricuspid. Aortic valve regurgitation is trivial. Mild to moderate aortic valve sclerosis/calcification is present, without any evidence of aortic stenosis.  5. Aortic dilatation noted. There is mild dilatation of the aortic root and of the ascending aorta measuring 41 mm and 105mm respectively.  6. The inferior vena cava is dilated in size with <50% respiratory variability, suggesting right atrial pressure of 15 mmHg.  7. Left atrial size was severely dilated.  8. Right atrial size was mildly dilated.  FINDINGS  Left Ventricle: Left ventricular ejection fraction, by estimation, is <20%. The left ventricle has severely decreased function. The left ventricle demonstrates global hypokinesis. The left ventricular internal cavity size was mildly dilated. There is mild concentric left ventricular hypertrophy. Abnormal (paradoxical) septal motion, consistent with left bundle branch block. Left ventricular diastolic function could not be evaluated. Elevated left ventricular end-diastolic pressure. Right Ventricle: The right ventricular size is severely enlarged. No increase in right ventricular wall thickness. Right ventricular systolic function is severely reduced. There is moderately elevated pulmonary artery systolic pressure. The tricuspid regurgitant velocity is 3.01 m/s, and with an assumed right atrial pressure of 15 mmHg, the estimated right ventricular systolic pressure is 97.6 mmHg. Left Atrium: Left atrial size was severely dilated. Right Atrium: Right atrial size was mildly dilated. Pericardium: There is no evidence of pericardial effusion. Mitral Valve: The mitral valve is normal in structure. Normal mobility of the mitral valve leaflets. Moderate mitral annular calcification. Mild mitral valve regurgitation. No evidence of mitral valve stenosis. Tricuspid Valve: The tricuspid valve is normal in structure. Tricuspid valve regurgitation is mild . No evidence of tricuspid  stenosis. Aortic Valve: The aortic valve is tricuspid. Aortic valve regurgitation is trivial. Mild to moderate aortic valve sclerosis/calcification is present, without any evidence of aortic stenosis. Mild to moderate aortic valve annular calcification. Pulmonic Valve: The pulmonic valve was normal in structure. Pulmonic valve regurgitation is mild. No evidence of pulmonic stenosis. Aorta: The aortic root is normal in size and structure and aortic dilatation noted. 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 Venous: The inferior vena cava is dilated in size with less than 50% respiratory variability, suggesting right atrial pressure of 15 mmHg. IAS/Shunts: No atrial level shunt detected by color flow Doppler.  LEFT VENTRICLE PLAX 2D LVIDd:         6.10 cm      Diastology LVIDs:         5.70 cm      LV e' lateral:   4.79 cm/s LV PW:         1.70 cm      LV E/e' lateral: 18.2 LV IVS:        1.20 cm      LV e' medial:    3.26 cm/s LVOT diam:     2.30 cm      LV E/e' medial:  26.8 LV SV:         38 LV SV Index:   17 LVOT Area:     4.15 cm  LV Volumes (MOD) LV vol d, MOD A2C: 216.0 ml LV vol d, MOD A4C: 186.0 ml LV vol s, MOD A2C: 188.0 ml LV vol s, MOD A4C: 173.0 ml LV SV MOD A2C:     28.0 ml LV SV MOD A4C:     186.0 ml LV SV MOD BP:      18.8 ml RIGHT VENTRICLE            IVC RV S prime:     7.07 cm/s  IVC diam: 2.80 cm TAPSE (M-mode): 1.1 cm LEFT ATRIUM              Index       RIGHT ATRIUM           Index LA diam:        5.00 cm  2.22 cm/m  RA Area:     24.20 cm LA Vol (A2C):  113.0 ml 50.20 ml/m RA Volume:   83.40 ml  37.05 ml/m LA Vol (A4C):   112.0 ml 49.76 ml/m LA Biplane Vol: 114.0 ml 50.65 ml/m  AORTIC VALVE             PULMONIC VALVE LVOT Vmax:   69.80 cm/s  PV Vmax:          2.25 m/s LVOT Vmean:  49.900 cm/s PV Peak grad:     20.2 mmHg LVOT VTI:    0.092 m     PR End Diast Vel: 2.31 msec  AORTA Ao Root diam: 4.10 cm Ao Asc diam:  4.10 cm MITRAL VALVE                TRICUSPID VALVE MV Area (PHT): 3.17 cm    TR Peak grad:   36.2 mmHg MV Decel Time: 239 msec    TR Vmax:        301.00 cm/s MV E velocity: 87.40 cm/s                            SHUNTS                            Systemic VTI:  0.09 m                            Systemic Diam: 2.30 cm Fransico Him MD Electronically signed by Fransico Him MD Signature Date/Time: 08/13/2019/3:03:36 PM    Final

## 2019-08-17 NOTE — NC FL2 (Signed)
[place] LEVEL OF CARE SCREENING TOOL     IDENTIFICATION  Patient Name: Ricky Hayden Birthdate: Jun 24, 1945 Sex: male Admission Date (Current Location): 08/12/2019  [facility] and [place] Number:  Herbalist and Address:  [facility], [place] [address], [place], [place] [address]      Provider Number: [id number]  Attending Physician Name and Address:  Jonetta Osgood, MD  Relative Name and Phone Number:  [name], daughter, [phone]    Current Level of Care: Hospital Recommended Level of Care: [place] Prior Approval Number:    Date Approved/Denied:   PASRR Number: [id number]  Discharge Plan: SNF    Current Diagnoses: Patient Active Problem List   Diagnosis Date Noted  . Acute hypoxemic respiratory failure due to COVID-19 ([place]) 08/12/2019  . CKD (chronic kidney disease) stage 3, GFR 30-59 ml/min 08/12/2019  . COPD with acute exacerbation ([place]) 08/12/2019  . Hyperkalemia   . Acute renal injury ([place]) 10/21/2018  . Respiratory tract congestion with cough 06/18/2015  . Chronic combined systolic and diastolic CHF, NYHA class 2 ([place]) 06/10/2015  . Leg swelling 07/16/2014  . Preventative health care 03/07/2014  . COPD (chronic obstructive pulmonary disease) ([place]) 09/11/2013  . BPPV (benign paroxysmal positional vertigo) 10/05/2012  . BPH (benign prostatic hyperplasia) 01/10/2008  . Gout 04/22/2006  . ERECTILE DYSFUNCTION 04/22/2006  . 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 Hyperlipidemia 03/15/2006  . Essential hypertension 03/15/2006  . GERD 03/15/2006  . HIATAL HERNIA 03/15/2006  . OSTEOARTHRITIS 03/15/2006  . Traumatic brain injury ([place]) 03/15/2006    Orientation RESPIRATION BLADDER Height & Weight     Self, Time, Situation, Place  O2(Nasal cannula .5L) Continent Weight: 239 lb 8 oz (108.6 kg) Height:  5\' 8"  (172.7 cm)  BEHAVIORAL SYMPTOMS/MOOD NEUROLOGICAL BOWEL NUTRITION STATUS      Continent  Diet(Please see DC Summary)  AMBULATORY STATUS COMMUNICATION OF NEEDS Skin   Limited Assist Verbally Normal                       Personal Care Assistance Level of Assistance  Bathing, Feeding, Dressing Bathing Assistance: Limited assistance Feeding assistance: Independent Dressing Assistance: Limited assistance     Functional Limitations Info  Sight, Hearing, Speech Sight Info: Adequate Hearing Info: Adequate Speech Info: Adequate    SPECIAL CARE FACTORS FREQUENCY  PT (By licensed PT), OT (By licensed OT)     PT Frequency: 5x/week OT Frequency: 5x/week            Contractures Contractures Info: Not present    Additional Factors Info  Code Status, Allergies, Insulin Sliding Scale, Isolation Precautions Code Status Info: Full Allergies Info: Calcium Channel Blockers   Insulin Sliding Scale Info: See DC Summary Isolation Precautions Info: COVID +     Current Medications (08/17/2019):  This is the current hospital active medication list Current Facility-Administered Medications  Medication Dose Route Frequency Provider Last Rate Last Admin  . acetaminophen (TYLENOL) tablet 650 mg  650 mg Oral Q6H PRN Lavina Hamman, MD      . ascorbic acid (VITAMIN C) tablet 500 mg  500 mg Oral Daily Lavina Hamman, MD   500 mg at 08/17/19 0737  . carvedilol (COREG) tablet 12.5 mg  12.5 mg Oral BID WC Geralynn Rile, MD   Stopped at 08/17/19 1700  . finasteride (PROSCAR) tablet 5 mg  5 mg Oral Daily Lavina Hamman, MD   5 mg at 08/17/19 0738  .  guaiFENesin-dextromethorphan (ROBITUSSIN DM) 100-10 MG/5ML syrup 10 mL  10 mL Oral Q4H PRN Lavina Hamman, MD      . heparin injection 5,000 Units  5,000 Units Subcutaneous Q8H Lavina Hamman, MD   5,000 Units at 08/17/19 1338  . insulin aspart (novoLOG) injection 0-5 Units  0-5 Units Subcutaneous QHS Berle Mull M, MD      . insulin aspart (novoLOG) injection 0-9 Units  0-9 Units Subcutaneous TID WC Lavina Hamman, MD   1  Units at 08/17/19 1650  . Ipratropium-Albuterol (COMBIVENT) respimat 1 puff  1 puff Inhalation QID Elgergawy, Silver Huguenin, MD   1 puff at 08/17/19 1634  . methylPREDNISolone sodium succinate (SOLU-MEDROL) 40 mg/mL injection 20 mg  20 mg Intravenous Q12H Jonetta Osgood, MD   20 mg at 08/17/19 1631  . mometasone-formoterol (DULERA) 100-5 MCG/ACT inhaler 2 puff  2 puff Inhalation BID Lavina Hamman, MD   2 puff at 08/17/19 0735  . ondansetron (ZOFRAN) tablet 4 mg  4 mg Oral Q6H PRN Lavina Hamman, MD       Or  . ondansetron [facility]) injection 4 mg  4 mg Intravenous Q6H PRN Lavina Hamman, MD      . pantoprazole (PROTONIX) EC tablet 40 mg  40 mg Oral Daily Lavina Hamman, MD   40 mg at 08/17/19 0737  . PHENobarbital (LUMINAL) tablet 64.8 mg  64.8 mg Oral BID Lavina Hamman, MD   64.8 mg at 08/17/19 0737  . rosuvastatin (CRESTOR) tablet 20 mg  20 mg Oral q1800 Geralynn Rile, MD   20 mg at 08/17/19 1631  . sacubitril-valsartan (ENTRESTO) 49-51 mg per tablet  1 tablet Oral BID Geralynn Rile, MD   Stopped at 08/17/19 1800  . sodium chloride (OCEAN) 0.65 % nasal spray 1-2 spray  1-2 spray Each [place] PRN Elgergawy, Silver Huguenin, MD      . spironolactone (ALDACTONE) tablet 12.5 mg  12.5 mg Oral Daily O'Neal, Cassie Freer, MD   12.5 mg at 08/17/19 0811  . tamsulosin (FLOMAX) capsule 0.4 mg  0.4 mg Oral Daily Lavina Hamman, MD   0.4 mg at 08/17/19 0737  . torsemide (DEMADEX) tablet 20 mg  20 mg Oral Daily O'Neal, Cassie Freer, MD   20 mg at 08/17/19 1338  . umeclidinium bromide (INCRUSE ELLIPTA) 62.5 MCG/INH 18 puff  18 puff Inhalation Daily Lavina Hamman, MD   18 puff at 08/17/19 0734  . zinc sulfate capsule 220 mg  220 mg Oral Daily Lavina Hamman, MD   220 mg at 08/17/19 5784     Discharge Medications: Please see discharge summary for a list of discharge medications.  Relevant Imaging Results:  Relevant Lab Results:   Additional Information SSn: [id number] [place], LCSW

## 2019-08-17 NOTE — TOC Initial Note (Addendum)
Transition of Care [facility]) - Initial/Assessment Note    Patient Details  Name: Ricky Hayden MRN: [id number] Date of Birth: 18-Apr-1946  Transition of [facility]) CM/SW Contact:    Benard Halsted, LCSW Phone Number: 08/17/2019, 3:46 PM  Clinical Narrative:                 CSW received consult for possible SNF placement at time of discharge. CSW spoke with patient's daughter, Ricky Hayden, regarding PT recommendation of SNF placement at time of discharge. Ricky Hayden reported that patient's spouse is currently intubated at the hospital and is unable to care for patient at their home given patient's current physical needs and fall risk. Patient's daughter expressed understanding of PT recommendation and is agreeable to SNF placement at time of discharge. CSW explained that the [place] is not able to accept patient in their rehab being COVID positive and we will therefore use his Medicare Part A SNF benefits. CSW discussed insurance authorization process and provided Medicare SNF ratings list. Patient expressed being hopeful for rehab and to feel better soon. Ricky Hayden will consult her siblings to see if they will be in agreement for SNF versus home health. No further questions reported at this time. CSW to continue to follow and assist with discharge planning needs.  5pm-CSW spoke with all of patient's children on a merged call to discuss discharge options. [facility] to be able to provide family with contact info post discharge. Family requesting CSW arrange both home health and SNF and will let CSW know their decision tomorrow. CSW sent SNF referral out.    Expected Discharge Plan: Skilled Nursing Facility Barriers to Discharge: Continued Medical Work up, SNF Pending bed offer   Patient Goals and CMS Choice Patient states their goals for this hospitalization and ongoing recovery are:: Rehab CMS Medicare.gov Compare Post Acute Care list provided to:: Patient Represenative (must comment)(Daughter,  Ricky Hayden) Choice offered to / list presented to : Adult Children  Expected Discharge Plan and Services Expected Discharge Plan: [place] In-house Referral: Clinical Social Work   Post Acute Care Choice: [place] Living arrangements for the past 2 months: [place]                                      Prior Living Arrangements/Services Living arrangements for the past 2 months: Single Family Home Lives with:: Adult Children Patient language and need for interpreter reviewed:: Yes Do you feel safe going back to the place where you live?: Yes      Need for Family Participation in Patient Care: Yes (Comment) Care giver support system in place?: Yes (comment)   Criminal Activity/Legal Involvement Pertinent to Current Situation/Hospitalization: No - Comment as needed  Activities of Daily Living      Permission Sought/Granted Permission sought to share information with : Facility Sport and exercise psychologist, Family Supports Permission granted to share information with : Yes, Verbal Permission Granted  Share Information with NAME: Ricky Hayden  Permission granted to share info w AGENCY: SNFs  Permission granted to share info w Relationship: Daughter  Permission granted to share info w Contact Information: [phone]  Emotional Assessment     Affect (typically observed): Appropriate Orientation: : Oriented to Self, Oriented to Place, Oriented to Situation Alcohol / Substance Use: Not Applicable Psych Involvement: No (comment)  Admission diagnosis:  Acute on chronic respiratory failure with hypoxia ([place]) [J96.21] Acute  hypoxemic respiratory failure due to COVID-19 ([place]) [U07.1, J96.01] COVID-19 [U07.1] Patient Active Problem List   Diagnosis Date Noted  . Acute hypoxemic respiratory failure due to COVID-19 ([place]) 08/12/2019  . CKD (chronic kidney disease) stage 3, GFR 30-59 ml/min 08/12/2019  . COPD with acute exacerbation ([place]) 08/12/2019   . Hyperkalemia   . Acute renal injury ([place]) 10/21/2018  . Respiratory tract congestion with cough 06/18/2015  . Chronic combined systolic and diastolic CHF, NYHA class 2 ([place]) 06/10/2015  . Leg swelling 07/16/2014  . Preventative health care 03/07/2014  . COPD (chronic obstructive pulmonary disease) ([place]) 09/11/2013  . BPPV (benign paroxysmal positional vertigo) 10/05/2012  . BPH (benign prostatic hyperplasia) 01/10/2008  . Gout 04/22/2006  . ERECTILE DYSFUNCTION 04/22/2006  . 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 Hyperlipidemia 03/15/2006  . Essential hypertension 03/15/2006  . GERD 03/15/2006  . HIATAL HERNIA 03/15/2006  . OSTEOARTHRITIS 03/15/2006  . 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 ([address]), [place] [address]  ([place]) [place] [address] Phone: [phone] Fax: [phone]     Social Determinants of Health (SDOH) Interventions    Readmission Risk Interventions Readmission Risk Prevention Plan 08/17/2019  Transportation Screening Complete  PCP or Specialist Appt within 5-7 Days Complete  Home Care Screening Complete  Medication Review (RN CM) Complete  Some recent data might be hidden

## 2019-08-18 DIAGNOSIS — U071 COVID-19: Secondary | ICD-10-CM | POA: Diagnosis not present

## 2019-08-18 DIAGNOSIS — J9601 Acute respiratory failure with hypoxia: Secondary | ICD-10-CM | POA: Diagnosis not present

## 2019-08-18 LAB — COMPREHENSIVE METABOLIC PANEL
ALT: 29 U/L (ref 0–44)
AST: 22 U/L (ref 15–41)
Albumin: 3 g/dL — ABNORMAL LOW (ref 3.5–5.0)
Alkaline Phosphatase: 52 U/L (ref 38–126)
Anion gap: 10 (ref 5–15)
BUN: 40 mg/dL — ABNORMAL HIGH (ref 8–23)
CO2: 34 mmol/L — ABNORMAL HIGH (ref 22–32)
Calcium: 8.8 mg/dL — ABNORMAL LOW (ref 8.9–10.3)
Chloride: 97 mmol/L — ABNORMAL LOW (ref 98–111)
Creatinine, Ser: 1.45 mg/dL — ABNORMAL HIGH (ref 0.61–1.24)
GFR calc Af Amer: 54 mL/min — ABNORMAL LOW (ref >60)
GFR calc non Af Amer: 47 mL/min — ABNORMAL LOW (ref >60)
Glucose, Bld: 121 mg/dL — ABNORMAL HIGH (ref 70–99)
Potassium: 3.9 mmol/L (ref 3.5–5.1)
Sodium: 141 mmol/L (ref 135–145)
Total Bilirubin: 0.8 mg/dL (ref 0.3–1.2)
Total Protein: 6.2 g/dL — ABNORMAL LOW (ref 6.5–8.1)

## 2019-08-18 LAB — GLUCOSE, CAPILLARY
Glucose-Capillary: 137 mg/dL — ABNORMAL HIGH (ref 70–99)
Glucose-Capillary: 194 mg/dL — ABNORMAL HIGH (ref 70–99)
Glucose-Capillary: 143 mg/dL — ABNORMAL HIGH (ref 70–99)
Glucose-Capillary: 121 mg/dL — ABNORMAL HIGH (ref 70–99)

## 2019-08-18 NOTE — Progress Notes (Signed)
   08/18/19 2315  Vitals  Pulse Rate 90  ECG Heart Rate 91  Resp (!) 25  Oxygen Therapy  SpO2 (!) 67 %  O2 Device Room Air  Patient Activity (if Appropriate) In chair  Pain Assessment  Pain Score Asleep  MEWS Score  MEWS Temp 0  MEWS Systolic 0  MEWS Pulse 0  MEWS RR 1  MEWS LOC 0  MEWS Score 1  MEWS Score Color Green   Patient's SpO2 was 84-94% (but mostly 90-94%) on RA while awake in chair. Patient denied SOB at rest. While sleeping in chair, SpO2 noted to maintain 70s and go as low as 67% on RA. O2 1L [place] re-applied. Will continue to monitor.

## 2019-08-18 NOTE — Progress Notes (Signed)
Occupational Therapy Evaluation Patient Details Name: Ricky Hayden MRN: [id number] DOB: March 28, 1946 Today's Date: 08/18/2019    History of Present Illness 74 year old male to urgent care with fatigue and generalized weakness. Patient found to be COVID positive and hypotensive and admitted to the hospital on 08/12/2019. Patient's wife also hospitalized with COVID per patient report. chest x-ray significant for multifocal pneumonia. Cardiology consulted for LVEF 20%. 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 Pt Modified Independent with ADLs and mobility using Rollator. Wife completed IADLs, as well as other family members as needed. Pt's wife is currently hospitalized for COVID-19 as well (currently intubated, per staff). Assessed pt with Rollator safety during mobility in room and hallway distance at Supervision level. Pt with minor cues needed initially for Rollator brake use, but pt able to Independently implement safety techniques with Rollator by end of session. Pt unable to doff/don socks, but reports wearing slippers at home. Assessed LB dressing with simulation loop with pt having difficulty getting around B feet, but eventually able to do so. Discussed pt's medication mgmt routine to ensure safety with pt reporting having separate containers for morning/evening meds. Briefly assessed cognition with pt score of 13/15 on BIMS. Pt admits to some short term memory difficulties, but overall WFL. Since wife hospitalized for COVID and typically completes IADLs in the home, recommend 24/7 supervision/assist for daily tasks at home with [place]. If 24/7 supervision/assistance not  available, recommend SNF for short term rehab.    Follow Up Recommendations  Home health OT;Supervision/Assistance - 24 hour;Other (comment)(Recommend SNF for rehab if 24/7 support not available)    Equipment Recommendations  None recommended by OT    Recommendations for Other Services       Precautions / Restrictions Precautions Precautions: Fall;Other (comment) Precaution Comments: bilat [place] boots Restrictions Weight Bearing Restrictions: No      Mobility Bed Mobility               General bed mobility comments: up in recliner   Transfers Overall transfer level: Needs assistance Equipment used: 4-wheeled walker Transfers: Sit to/from [facility] Sit to Stand: Supervision Stand pivot transfers: Supervision       General transfer comment: Supervision, minor cues initially for locking/unlocking Rollator brakes but pt progressed to implementing without cues     Balance Overall balance assessment: Needs assistance Sitting-balance support: Feet supported Sitting balance-Leahy Scale: Good     Standing balance support: Bilateral upper extremity supported Standing balance-Leahy Scale: Fair                             ADL either performed or assessed with clinical judgement   ADL Overall ADL's : Needs assistance/impaired Eating/Feeding: Independent;Sitting   Grooming: Supervision/safety;Standing   Upper Body Bathing: Supervision/ safety;Sitting;Standing   Lower Body Bathing: Minimal assistance;Sit to/from stand;Sitting/lateral leans   Upper Body Dressing : Set up;Sitting;Standing   Lower Body Dressing: Minimal assistance;Sit to/from stand;Sitting/lateral leans Lower Body Dressing Details (indicate cue type and reason): Unable to doff/don socks. Reports typically wears slippers at home. Some difficulty getting pants around feet  Toilet Transfer: Supervision/safety;Ambulation;Regular Toilet   Toileting- Water quality scientist and  Hygiene:  Supervision/safety;Sit to/from stand;Sitting/lateral lean       Functional mobility during ADLs: Supervision/safety General ADL Comments: Pt overall Min A at most for ADLs with Rollator. Some limitations in endurance, but overall improving from admission      Vision         Perception     Praxis      Pertinent Vitals/Pain Pain Assessment: No/denies pain     Hand Dominance Right   Extremity/Trunk Assessment Upper Extremity Assessment Upper Extremity Assessment: Overall WFL for tasks assessed(baseline numbness in L hand, 4/5 digit)   Lower Extremity Assessment Lower Extremity Assessment: Defer to PT evaluation       Communication Communication Communication: No difficulties   Cognition Arousal/Alertness: Awake/alert Behavior During Therapy: WFL for tasks assessed/performed Overall Cognitive Status: History of cognitive impairments - at baseline                                 General Comments: history TBI   General Comments  Pt on 1 L O2 at rest (pt wears when sleeping). Assessed cardiopulmonary tolerance on RA with desats to 88% briefly during activity, but recovers quickly to 90s. Assessed cognition briefly with 13/15 score on BIMS. Pt admits to some short term memory difficulties. Discussed pt's setup and routine with med mgmt    Exercises     Shoulder Instructions      Home Living Family/patient expects to be discharged to:: Private residence Living Arrangements: Spouse/significant other Available Help at Discharge: Family Type of Home: House Home Access: Stairs to enter Technical brewer of Steps: 1 Entrance Stairs-Rails: None Home Layout: One level     Bathroom Shower/Tub: Walk-in shower;Tub/shower unit         Home Equipment: Shower seat;Hospital bed;Walker - 4 wheels   Additional Comments: wife is hospitalized with COVID on 3rd floor      Prior Functioning/Environment Level of Independence: Independent with  assistive device(s);Needs assistance  Gait / Transfers Assistance Needed: Modified Independent with mobility in the home ADL's / Homemaking Assistance Needed: Modified Independent with ADLs. Wife completes IADLs   Comments: just got walking chair from [place], uses it for community mobility, limited household mobility due to COPD, no home O2, wife does cooking/cleaning/grocery shopping, son does yard work, children live close by and help with things around the house        OT Problem List: Decreased activity tolerance;Impaired balance (sitting and/or standing);Cardiopulmonary status limiting activity      OT Treatment/Interventions: Self-care/ADL training;Therapeutic exercise;Energy conservation;DME and/or AE instruction;Therapeutic activities;Patient/family education    OT Goals(Current goals can be found in the care plan section) Acute Rehab OT Goals Patient Stated Goal: to be able to go home soon OT Goal Formulation: With patient Time For Goal Achievement: 09/01/19 Potential to Achieve Goals: Good ADL Goals Pt Will Perform Grooming: with modified independence;standing Pt Will Perform Lower Body Bathing: sit to/from stand;sitting/lateral leans;with supervision Pt Will Perform Lower Body Dressing: with supervision;sitting/lateral leans;sit to/from stand Pt Will Transfer to Toilet: with modified independence;ambulating;regular height toilet Pt Will Perform Toileting - Clothing Manipulation and hygiene: with modified independence;sitting/lateral leans;sit to/from stand Additional ADL Goal #1: Pt to verbalize at least 3 energy conservation strategies to implement during ADLs in order to maximize independence.  OT Frequency: Min 3X/week   Barriers to D/C:            Co-evaluation  AM-PAC OT "6 Clicks" Daily Activity     Outcome Measure Help from another person eating meals?: None Help from another person taking care of personal grooming?: A Little Help from another  person toileting, which includes using toliet, bedpan, or urinal?: A Little Help from another person bathing (including washing, rinsing, drying)?: A Little Help from another person to put on and taking off regular upper body clothing?: A Little Help from another person to put on and taking off regular lower body clothing?: A Little 6 Click Score: 19   End of Session Equipment Utilized During Treatment: Gait belt;Other (comment)(Rollator)  Activity Tolerance: Patient tolerated treatment well Patient left: in chair;with call bell/phone within reach;with chair alarm set  OT Visit Diagnosis: Unsteadiness on feet (R26.81);Other (comment)(decreased cardiopulmonary tolerance )                Time: 6438-3779 OT Time Calculation (min): 22 min Charges:  OT General Charges $OT Visit: 1 Visit OT Evaluation $OT Eval Moderate Complexity: 1 Mod  Layla Maw, OTR/L  Layla Maw 08/18/2019, 2:13 PM

## 2019-08-18 NOTE — Care Management (Addendum)
CM informed by daughter that family plans to take pt home with [facility].  Family will provide 24 hour supervision at discharge. Pt in agreement and deferred discharge discussion to his daughter.  CM provided pts daughter Renata [place].gov HH list and DME agency list  - no preference given.  Pt plans to use the medicare benefit for [facility].  [facility] accepts referral.  Pt will discharge home with oxygen - [facility] accepts referral.  Pt gets his prescriptions filled by a local pharmacy.    CM left VM with [facility]mer[facility] SW informing of discharge plan. [facility] was notified on 08/14/19 of admit.

## 2019-08-18 NOTE — Progress Notes (Signed)
PROGRESS NOTE                                                                                                                                                                                                             Patient Demographics:    Luccas Towell, is a 74 y.o. male, DOB - 1945/09/14, PFX:[id number]  Outpatient Primary MD for the patient is Patient, No Pcp Per   Admit date - 08/12/2019   LOS - 6  Chief Complaint  Patient presents with  . Shortness of Breath       Brief Narrative: Patient is a 74 y.o. male with PMHx of chronic systolic heart failure, CAD, BPH, GERD, CKD stage IIIb, COPD hypotension and acute hypoxic respiratory failure in the setting of decompensated systolic heart failure and COVID-19 pneumonia.  See below for further details.  Significant Events: 4/10>> admit to [facility] 4/11>> EF < 20%, severe RV systolic dysfunction   [id number] medications: Steroids: 4/10>>4/14 Remdesivir: 4/10>> 4/14  Antibiotics: None  Microbiology data: 4/10>> Blood cultures: Negative  DVT prophylaxis: SQ heparin  Procedures: None  Consults: Cardiology    Subjective:   Much better-had a good night sleep last night compared to yesterday.  Has been titrated to room air today.   Assessment  & Plan :   Acute Hypoxic Resp Failure due to Covid 19 Viral pneumonia decompensated systolic heart failure: Improved-on room air.  CRP only minimally elevated-stop steroids today.  Volume status is markedly improved-cardiology following-continue Aldactone, Coreg, Entresto and Lasix.  Cardiology will arrange for outpatient follow-up post discharge-for further work-up including LHC.   Fever: afebrile  O2 requirements:  SpO2: 92 % O2 Flow Rate (L/min): 0.5 L/min   COVID-19 Labs: Recent Labs    08/16/19 0253 08/17/19 0253  DDIMER 0.49 0.55*  CRP 1.7* 1.1*       Component Value Date/Time   BNP 1,462.8 (H)  08/14/2019 0252    Recent Labs  Lab 08/12/19 1543  PROCALCITON 0.15    Lab Results  Component Value Date   SARSCOV2NAA NOT DETECTED 10/21/2018    Prone/Incentive Spirometry: encouraged  incentive spirometry use 3-4/hour.  Sinus bradycardia: Brief run of sinus bradycardia down to the 30s on 4/15 morning-no further runs of bradycardia-beta-blocker dosage has been decreased.  Cardiology following.  COPD: Appears stable-no wheezing-continue bronchodilators-stopping steroids 4/16.  CKD stage  IIIb: Creatinine close to usual baseline-continue to monitor closely.  HTN: BP stable-continue Coreg and Entresto  HLD: Continue statin  GERD: Continue PPI  History of seizure disorder: Continue phenobarbital  BPH: Stable-continue Flomax and Proscar  History of chronic lower extremity edema: Compressive wrappings in place-ongoing diuresis with IV Lasix and oral Aldactone.  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   Weight as of this encounter: 108.9 kg.   ABG: No results found for: PHART, PCO2ART, PO2ART, HCO3, TCO2, ACIDBASEDEF, O2SAT  Vent Settings: N/A   Condition - Guarded  Family Communication  :  Daughter-Ashley-updated over the phone 4/16  Code Status :  Full Code  Diet :  Diet Order            Diet Heart Room service appropriate? Yes; Fluid consistency: Thin  Diet effective now               Disposition Plan  :  SNF versus home with home health services-awaiting family decision/SW/CM following  Barriers to discharge: Decompensated heart failure/COVID-19  Antimicorbials  :    Anti-infectives (From admission, onward)   Start     Dose/Rate Route Frequency Ordered Stop   08/13/19 1000  remdesivir 100 mg in sodium chloride 0.9 % 100 mL IVPB     100 mg 200 mL/hr over 30 Minutes Intravenous Daily 08/12/19  1643 08/16/19 0828   08/12/19 1645  remdesivir 200 mg in sodium chloride 0.9% 250 mL IVPB     200 mg 580 mL/hr over 30 Minutes Intravenous Once 08/12/19 1643 08/12/19 1906      Inpatient Medications  Scheduled Meds: . vitamin C  500 mg Oral Daily  . carvedilol  12.5 mg Oral BID WC  . finasteride  5 mg Oral Daily  . heparin  5,000 Units Subcutaneous Q8H  . insulin aspart  0-5 Units Subcutaneous QHS  . insulin aspart  0-9 Units Subcutaneous TID WC  . Ipratropium-Albuterol  1 puff Inhalation QID  . methylPREDNISolone (SOLU-MEDROL) injection  20 mg Intravenous Q12H  . mometasone-formoterol  2 puff Inhalation BID  . pantoprazole  40 mg Oral Daily  . PHENobarbital  64.8 mg Oral BID  . rosuvastatin  20 mg Oral q1800  . sacubitril-valsartan  1 tablet Oral BID  . spironolactone  12.5 mg Oral Daily  . tamsulosin  0.4 mg Oral Daily  . torsemide  20 mg Oral Daily  . umeclidinium bromide  18 puff Inhalation Daily  . zinc sulfate  220 mg Oral Daily   Continuous Infusions:  PRN Meds:.acetaminophen, guaiFENesin-dextromethorphan, ondansetron **OR** ondansetron (ZOFRAN) IV, sodium chloride   Time Spent in minutes  25  See all Orders from today for further details   Oren Binet M.D on 08/18/2019 at 12:10 PM  To page go to [email or web address] - use universal password  [facility] -  Office  [phone]    Objective:   Vitals:   08/18/19 0502 08/18/19 0505 08/18/19 0724 08/18/19 0805  BP: 107/81   102/86  Pulse: 74 85  87  Resp: (!) 30 16  17   Temp: 98.1 F (36.7 C)   98 F (36.7 C)  TempSrc: Oral   Oral  SpO2: 90% 90%  92%  Weight:   108.9 kg   Height:        Wt Readings from Last  3 Encounters:  08/18/19 108.9 kg  08/10/19 113.9 kg  10/22/18 108.8 kg     Intake/Output Summary (Last 24 hours) at 08/18/2019 1210 Last data filed at 08/18/2019 0850 Gross per 24 hour  Intake 900 ml  Output --  Net 900 ml     Physical Exam Gen Exam:Alert awake-not in any  distress HEENT:atraumatic, normocephalic Chest: B/L clear to auscultation anteriorly CVS:S1S2 regular Abdomen:soft non tender, non distended Extremities: Bilateral compressive stockings Neurology: Non focal Skin: no rash   Data Review:    CBC Recent Labs  Lab 08/13/19 0231 08/14/19 0252 08/15/19 0256 08/16/19 0253 08/17/19 0253  WBC 4.0 4.3 8.2 6.2 7.6  HGB 15.1 15.4 16.0 16.4 16.8  HCT 48.3 49.6 50.3 51.3 52.3*  PLT 101* 99* 100* 104* 129*  MCV 95.3 95.9 95.3 93.8 92.9  MCH 29.8 29.8 30.3 30.0 29.8  MCHC 31.3 31.0 31.8 32.0 32.1  RDW 14.2 13.9 13.7 13.8 13.7  LYMPHSABS 0.6* 0.5* 0.7 0.7 1.0  MONOABS 0.3 0.2 0.9 0.7 0.9  EOSABS 0.0 0.0 0.0 0.0 0.0  BASOSABS 0.0 0.0 0.0 0.0 0.0    Chemistries  Recent Labs  Lab 08/13/19 0231 08/13/19 0231 08/14/19 0252 08/15/19 0256 08/16/19 0253 08/17/19 0253 08/18/19 0231  NA 145   < > 142 142 140 140 141  K 3.7   < > 4.0 3.6 3.8 4.2 3.9  CL 98   < > 95* 95* 94* 93* 97*  CO2 36*   < > 37* 38* 35* 38* 34*  GLUCOSE 163*   < > 159* 165* 143* 148* 121*  BUN 35*   < > 30* 35* 34* 35* 40*  CREATININE 1.61*   < > 1.30* 1.30* 1.19 1.46* 1.45*  CALCIUM 8.7*   < > 8.8* 8.8* 8.4* 9.0 8.8*  MG 2.1  --  2.2 2.1 2.1 2.1  --   AST 32   < > 35 34 23 21 22   ALT 33   < > 38 39 28 29 29   ALKPHOS 67   < > 65 64 63 59 52  BILITOT 0.6   < > 0.7 0.7 0.9 1.0 0.8   < > = values in this interval not displayed.   ------------------------------------------------------------------------------------------------------------------ No results for input(s): CHOL, HDL, LDLCALC, TRIG, CHOLHDL, LDLDIRECT in the last 72 hours.  Lab Results  Component Value Date   HGBA1C 5.4 10/05/2012   ------------------------------------------------------------------------------------------------------------------ No results for input(s): TSH, T4TOTAL, T3FREE, THYROIDAB in the last 72 hours.  Invalid input(s):  FREET3 ------------------------------------------------------------------------------------------------------------------ No results for input(s): VITAMINB12, FOLATE, FERRITIN, TIBC, IRON, RETICCTPCT in the last 72 hours.  Coagulation profile No results for input(s): INR, PROTIME in the last 168 hours.  Recent Labs    08/16/19 0253 08/17/19 0253  DDIMER 0.49 0.55*    Cardiac Enzymes No results for input(s): CKMB, TROPONINI, MYOGLOBIN in the last 168 hours.  Invalid input(s): CK ------------------------------------------------------------------------------------------------------------------    Component Value Date/Time   BNP 1,462.8 (H) 08/14/2019 0252    Micro Results Recent Results (from the past 240 hour(s))  Blood Culture (routine x 2)     Status: None   Collection Time: 08/12/19  4:56 PM   Specimen: BLOOD  Result Value Ref Range Status   Specimen Description BLOOD SITE NOT SPECIFIED  Final   Special Requests   Final    BOTTLES DRAWN AEROBIC AND ANAEROBIC Blood Culture adequate volume   Culture   Final    NO GROWTH 5 DAYS Performed at [facility]  Hospital Lab, [place] [address], [place], [place] [address]    Report Status 08/17/2019 FINAL  Final  Blood Culture (routine x 2)     Status: None   Collection Time: 08/12/19  5:02 PM   Specimen: BLOOD  Result Value Ref Range Status   Specimen Description BLOOD LEFT ANTECUBITAL  Final   Special Requests   Final    BOTTLES DRAWN AEROBIC AND ANAEROBIC Blood Culture adequate volume   Culture   Final    NO GROWTH 5 DAYS Performed at [facility], [place] [address], [place], [place] [address]    Report Status 08/17/2019 FINAL  Final    Radiology Reports DG CHEST PORT 1 VIEW  Result Date: 08/17/2019 CLINICAL DATA:  Shortness of breath EXAM: PORTABLE CHEST 1 VIEW COMPARISON:  08/12/2019 FINDINGS: Cardiomegaly. Bibasilar atelectasis. No effusions or edema. No acute bony abnormality. IMPRESSION: Cardiomegaly, bibasilar  atelectasis. Electronically Signed   By: Rolm Baptise M.D.   On: 08/17/2019 07:52   DG Chest Port 1 View  Result Date: 08/12/2019 CLINICAL DATA:  Shortness of breath, COVID positive EXAM: PORTABLE CHEST 1 VIEW COMPARISON:  06/29/2018 FINDINGS: Mild left basilar opacity, likely atelectasis. Lungs otherwise clear in this patient with known COVID. No pleural effusion or pneumothorax. Cardiomegaly.  Mild thoracic aortic atherosclerosis. IMPRESSION: Mild left basilar opacity, likely atelectasis. Lungs otherwise clear in this patient with known COVID. Cardiomegaly.  Thoracic aortic atherosclerosis. Electronically Signed   By: Julian Hy M.D.   On: 08/12/2019 16:08   ECHOCARDIOGRAM COMPLETE  Result Date: 08/13/2019    ECHOCARDIOGRAM REPORT   Patient Name:   SERGEI DELO Date of Exam: 08/13/2019 Medical Rec #:  [id number]     Height:       68.0 in Accession #:    [id number]    Weight:       251.0 lb Date of Birth:  02-27-1946    BSA:          2.251 m Patient Age:    58 years      BP:           118/87 mmHg Patient Gender: M             HR:           88 bpm. Exam Location:  Inpatient Procedure: 2D Echo, Cardiac Doppler, Color Doppler and Intracardiac            Opacification Agent Indications:    I50.9* Heart failure (unspecified)  History:        Patient has prior history of Echocardiogram examinations, most                 recent 04/15/2015. CHF, CAD, COPD; Risk Factors:Hypertension and                 Dyslipidemia. Covid 19 positive. Hypoxia.  Sonographer:    Roseanna Rainbow RDCS Referring Phys: [id number] [place]  Sonographer Comments: Technically difficult study due to poor echo windows. Image acquisition challenging due to patient body habitus. IMPRESSIONS  1. Left ventricular ejection fraction, by estimation, is <20%. The left ventricle has severely decreased function. The left ventricle demonstrates global hypokinesis. The left ventricular internal cavity size was mildly dilated. There is mild concentric   left ventricular hypertrophy. Left ventricular diastolic function could not be evaluated. Elevated left ventricular end-diastolic pressure.  2. Right ventricular systolic function is severely reduced. The right ventricular size is severely enlarged. There is moderately elevated pulmonary artery systolic  pressure.  3. The mitral valve is normal in structure. Mild mitral valve regurgitation. No evidence of mitral stenosis.  4. The aortic valve is tricuspid. Aortic valve regurgitation is trivial. Mild to moderate aortic valve sclerosis/calcification is present, without any evidence of aortic stenosis.  5. Aortic dilatation noted. There is mild dilatation of the aortic root and of the ascending aorta measuring 41 mm and 83mm respectively.  6. The inferior vena cava is dilated in size with <50% respiratory variability, suggesting right atrial pressure of 15 mmHg.  7. Left atrial size was severely dilated.  8. Right atrial size was mildly dilated. FINDINGS  Left Ventricle: Left ventricular ejection fraction, by estimation, is <20%. The left ventricle has severely decreased function. The left ventricle demonstrates global hypokinesis. The left ventricular internal cavity size was mildly dilated. There is mild concentric left ventricular hypertrophy. Abnormal (paradoxical) septal motion, consistent with left bundle branch block. Left ventricular diastolic function could not be evaluated. Elevated left ventricular end-diastolic pressure. Right Ventricle: The right ventricular size is severely enlarged. No increase in right ventricular wall thickness. Right ventricular systolic function is severely reduced. There is moderately elevated pulmonary artery systolic pressure. The tricuspid regurgitant velocity is 3.01 m/s, and with an assumed right atrial pressure of 15 mmHg, the estimated right ventricular systolic pressure is 62.8 mmHg. Left Atrium: Left atrial size was severely dilated. Right Atrium: Right atrial size was  mildly dilated. Pericardium: There is no evidence of pericardial effusion. Mitral Valve: The mitral valve is normal in structure. Normal mobility of the mitral valve leaflets. Moderate mitral annular calcification. Mild mitral valve regurgitation. No evidence of mitral valve stenosis. Tricuspid Valve: The tricuspid valve is normal in structure. Tricuspid valve regurgitation is mild . No evidence of tricuspid stenosis. Aortic Valve: The aortic valve is tricuspid. Aortic valve regurgitation is trivial. Mild to moderate aortic valve sclerosis/calcification is present, without any evidence of aortic stenosis. Mild to moderate aortic valve annular calcification. Pulmonic Valve: The pulmonic valve was normal in structure. Pulmonic valve regurgitation is mild. No evidence of pulmonic stenosis. Aorta: The aortic root is normal in size and structure and aortic dilatation noted. 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 Venous: The inferior vena cava is dilated in size with less than 50% respiratory variability, suggesting right atrial pressure of 15 mmHg. IAS/Shunts: No atrial level shunt detected by color flow Doppler.  LEFT VENTRICLE PLAX 2D LVIDd:         6.10 cm      Diastology LVIDs:         5.70 cm      LV e' lateral:   4.79 cm/s LV PW:         1.70 cm      LV E/e' lateral: 18.2 LV IVS:        1.20 cm      LV e' medial:    3.26 cm/s LVOT diam:     2.30 cm      LV E/e' medial:  26.8 LV SV:         38 LV SV Index:   17 LVOT Area:     4.15 cm  LV Volumes (MOD) LV vol d, MOD A2C: 216.0 ml LV vol d, MOD A4C: 186.0 ml LV vol s, MOD A2C: 188.0 ml LV vol s, MOD A4C: 173.0 ml LV SV MOD A2C:     28.0 ml LV SV MOD A4C:     186.0  ml LV SV MOD BP:      18.8 ml RIGHT VENTRICLE            IVC RV S prime:     7.07 cm/s  IVC diam: 2.80 cm TAPSE (M-mode): 1.1 cm LEFT ATRIUM              Index       RIGHT ATRIUM           Index LA diam:        5.00 cm  2.22 cm/m  RA Area:     24.20 cm LA  Vol (A2C):   113.0 ml 50.20 ml/m RA Volume:   83.40 ml  37.05 ml/m LA Vol (A4C):   112.0 ml 49.76 ml/m LA Biplane Vol: 114.0 ml 50.65 ml/m  AORTIC VALVE             PULMONIC VALVE LVOT Vmax:   69.80 cm/s  PV Vmax:          2.25 m/s LVOT Vmean:  49.900 cm/s PV Peak grad:     20.2 mmHg LVOT VTI:    0.092 m     PR End Diast Vel: 2.31 msec  AORTA Ao Root diam: 4.10 cm Ao Asc diam:  4.10 cm MITRAL VALVE               TRICUSPID VALVE MV Area (PHT): 3.17 cm    TR Peak grad:   36.2 mmHg MV Decel Time: 239 msec    TR Vmax:        301.00 cm/s MV E velocity: 87.40 cm/s                            SHUNTS                            Systemic VTI:  0.09 m                            Systemic Diam: 2.30 cm Fransico Him MD Electronically signed by Fransico Him MD Signature Date/Time: 08/13/2019/3:03:36 PM    Final

## 2019-08-18 NOTE — TOC Progression Note (Signed)
Transition of Care [facility]) - Progression Note    Patient Details  Name: Prophet Renwick MRN: [id number] Date of Birth: Oct 21, 1945  Transition of Care [facility]) CM/SW [place], LCSW Phone Number: 08/18/2019, 11:09 AM  Clinical Narrative:    Patient follows with the [facility] clinic with Dr. Domenica Fail. His social worker is Kellie Simmering [phone] ex. [phone]). Patient is not service connected and therefore would not qualify for long term care through the [place]. He is eligible to receive services through his Medicare Part A.    Expected Discharge Plan: Skilled Nursing Facility Barriers to Discharge: Continued Medical Work up, SNF Pending bed offer  Expected Discharge Plan and Services Expected Discharge Plan: [place] In-house Referral: Clinical Social Work   Post Acute Care Choice: [place] Living arrangements for the past 2 months: Single Family Home                                       Social Determinants of Health (SDOH) Interventions    Readmission Risk Interventions Readmission Risk Prevention Plan 08/18/2019 08/17/2019  Transportation Screening Complete Complete  PCP or Specialist Appt within 5-7 Days - Complete  PCP or Specialist Appt within 3-5 Days Complete -  Home Care Screening - Complete  Medication Review (RN CM) - Complete  HRI or Home Care Consult Complete -  Social Work Consult for Recovery Care Planning/Counseling Complete -  Palliative Care Screening Complete -  Medication Review Press photographer) Complete -  Some recent data might be hidden

## 2019-08-18 NOTE — Progress Notes (Addendum)
Physical Therapy Treatment Patient Details Name: Ricky Hayden MRN: [id number] DOB: 1946-01-16 Today's Date: 08/18/2019    History of Present Illness 74 year old male to urgent care with fatigue and generalized weakness. Patient found to be COVID positive and hypotensive and admitted to the hospital on 08/12/2019. Patient's wife also hospitalized with COVID per patient report. chest x-ray significant for multifocal pneumonia. Cardiology consulted for LVEF 20%. 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 Oxygen saturation stable on 1L, desats with extended mobility on room air. Recommend OT evaluation for cognition and ADLs. Recommend home PT and 24 hr supervision/assist. If 24/7 supervision/assist cannot be provided, then recommend SNF. After further discussion and demonstration of rollator, it appears what patient refers to as a walking chair is a rollator/4WW. OT to assess patient's safety with rollator during OT evaluation.  1L [place] ambulation 158ft: 97%, HR in 90s, RR up to 40s  RA ambulation 153ft: down to 84% post, HR 101bpm, RR up to 40s during ambulation but recovered to 90% in approx 20 seconds seated rest RA at rest: 93%, HR 90, RR 16    Follow Up Recommendations  Home health PT;Supervision/Assistance - 24 hour     Equipment Recommendations  (patient owns a 7LG)    Recommendations for Other Services See OT evaluation     Precautions / Restrictions Precautions Precautions: Fall;Other (comment) Precaution Comments: bilat [place] boots Restrictions Weight Bearing Restrictions: No    Mobility  Bed  Mobility   General bed mobility comments: Patient up in chair upon PT arrival.  Transfers Overall transfer level: Needs assistance Equipment used: Rolling walker (2 wheeled) Transfers: Sit to/from Stand Sit to Stand: Supervision         General transfer comment: Cues for hand placement  Ambulation/Gait Ambulation/Gait assistance: Supervision Gait Distance (Feet): 200 Feet Assistive device: Rolling walker (2 wheeled) Gait Pattern/deviations: Step-through pattern;Decreased step length - right;Decreased step length - left Gait velocity: decreased   General Gait Details: Patient requires cues and assist for obstacle negotiation and cues for safety with RW on turns as he steps outside of the walker. Less cues required as gait trial progressed. Half of gait trial on 1L with cued standing rest breaks as oxygen probe not picking up reading initially. O2 sat 97% on 1L, HR in 90s. Room air for ambulation back to room 167ft without standing rest break. Oxygen saturation 84%, HR 101 bpm on room air but recovered with seated rest approx 20 seconds to 90% or better.   Stairs Not assessed   Balance Overall balance assessment: Needs assistance         Standing balance support: Bilateral upper extremity supported   Standing balance comment: supervision with RW     Cognition Arousal/Alertness: Awake/alert Behavior During Therapy: WFL for tasks assessed/performed Overall Cognitive Status: History of cognitive impairments - at baseline    General Comments: history TBI         General Comments General comments (skin integrity, edema, etc.): Patient on 1L [place] at rest as he desats when sleeping per discussion with nurse.      Pertinent Vitals/Pain Pain Assessment: No/denies pain(No complaints of  pain, no signs/symptoms of pain)           PT Goals (current goals can now be found in the care plan section) Progress towards PT goals: Progressing toward goals    Frequency    Min  3X/week      PT Plan Discharge plan needs to be updated       AM-PAC PT "6 Clicks" Mobility   Outcome Measure  Help needed turning from your back to your side while in a flat bed without using bedrails?: A Little Help needed moving from lying on your back to sitting on the side of a flat bed without using bedrails?: A Little Help needed moving to and from a bed to a chair (including a wheelchair)?: A Little Help needed standing up from a chair using your arms (e.g., wheelchair or bedside chair)?: A Little Help needed to walk in hospital room?: A Little Help needed climbing 3-5 steps with a railing? : A Little 6 Click Score: 18    End of Session Equipment Utilized During Treatment: Oxygen Activity Tolerance: Patient tolerated treatment well Patient left: in chair;with call bell/phone within reach;with chair alarm set Nurse Communication: Other (comment)(nurse cleared pt to participate in PT session) PT Visit Diagnosis: Unsteadiness on feet (R26.81);Repeated falls (R29.6);Other abnormalities of gait and mobility (R26.89)     Time: 7824-2353 PT Time Calculation (min) (ACUTE ONLY): 27 min  Charges:  $Gait Training: 23-37 mins                     Birdie Hopes, PT, DPT Acute Rehab [phone] office     Birdie Hopes 08/18/2019, 11:15 AM

## 2019-08-18 NOTE — Progress Notes (Signed)
Cardiology Progress Note  Patient ID: Ricky Hayden MRN: [id number] DOB: June 14, 1945 Date of Encounter: 08/18/2019  Primary Cardiologist: No primary care provider on file.  Subjective  Bradycardia resolved. Doing well.   ROS:  All other ROS reviewed and negative. Pertinent positives noted in the HPI.     Inpatient Medications  Scheduled Meds: . vitamin C  500 mg Oral Daily  . carvedilol  12.5 mg Oral BID WC  . finasteride  5 mg Oral Daily  . heparin  5,000 Units Subcutaneous Q8H  . insulin aspart  0-5 Units Subcutaneous QHS  . insulin aspart  0-9 Units Subcutaneous TID WC  . Ipratropium-Albuterol  1 puff Inhalation QID  . methylPREDNISolone (SOLU-MEDROL) injection  20 mg Intravenous Q12H  . mometasone-formoterol  2 puff Inhalation BID  . pantoprazole  40 mg Oral Daily  . PHENobarbital  64.8 mg Oral BID  . rosuvastatin  20 mg Oral q1800  . sacubitril-valsartan  1 tablet Oral BID  . spironolactone  12.5 mg Oral Daily  . tamsulosin  0.4 mg Oral Daily  . torsemide  20 mg Oral Daily  . umeclidinium bromide  18 puff Inhalation Daily  . zinc sulfate  220 mg Oral Daily   Continuous Infusions:  PRN Meds: acetaminophen, guaiFENesin-dextromethorphan, ondansetron **OR** ondansetron (ZOFRAN) IV, sodium chloride   Vital Signs   Vitals:   08/18/19 0502 08/18/19 0505 08/18/19 0724 08/18/19 0805  BP: 107/81   102/86  Pulse: 74 85  87  Resp: (!) 30 16  17   Temp: 98.1 F (36.7 C)   98 F (36.7 C)  TempSrc: Oral   Oral  SpO2: 90% 90%  92%  Weight:   108.9 kg   Height:        Intake/Output Summary (Last 24 hours) at 08/18/2019 0916 Last data filed at 08/18/2019 0850 Gross per 24 hour  Intake 900 ml  Output --  Net 900 ml   Last 3 Weights 08/18/2019 08/17/2019 08/12/2019  Weight (lbs) 240 lb 239 lb 8 oz 251 lb  Weight (kg) 108.863 kg 108.636 kg 113.853 kg      Telemetry  Overnight telemetry shows NSR 70-90, which I personally reviewed.   ECG  The most recent ECG shows NSR  LBBB (170 ms), which I personally reviewed.   Physical Exam   Vitals:   08/18/19 0502 08/18/19 0505 08/18/19 0724 08/18/19 0805  BP: 107/81   102/86  Pulse: 74 85  87  Resp: (!) 30 16  17   Temp: 98.1 F (36.7 C)   98 F (36.7 C)  TempSrc: Oral   Oral  SpO2: 90% 90%  92%  Weight:   108.9 kg   Height:         Intake/Output Summary (Last 24 hours) at 08/18/2019 0916 Last data filed at 08/18/2019 0850 Gross per 24 hour  Intake 900 ml  Output --  Net 900 ml    Last 3 Weights 08/18/2019 08/17/2019 08/12/2019  Weight (lbs) 240 lb 239 lb 8 oz 251 lb  Weight (kg) 108.863 kg 108.636 kg 113.853 kg    Body mass index is 36.49 kg/m.   General: Well nourished, well developed, in no acute distress Head: Atraumatic, normal size  Eyes: PEERLA, EOMI  Neck: Supple, JVD 5-7 cm H20 Endocrine: No thryomegaly Cardiac: Normal S1, S2; RRR; no murmurs, rubs, or gallops Lungs: rhonchi   Abd: Soft, nontender, no hepatomegaly  Ext: trace LE edema  Musculoskeletal: No deformities, BUE and BLE strength normal and equal  Skin: Warm and dry, no rashes   Neuro: Alert and oriented to person, place, time, and situation, CNII-XII grossly intact, no focal deficits  Psych: Normal mood and affect   Labs  High Sensitivity Troponin:  No results for input(s): TROPONINIHS in the last 720 hours.   Cardiac EnzymesNo results for input(s): TROPONINI in the last 168 hours. No results for input(s): TROPIPOC in the last 168 hours.  Chemistry Recent Labs  Lab 08/16/19 0253 08/17/19 0253 08/18/19 0231  NA 140 140 141  K 3.8 4.2 3.9  CL 94* 93* 97*  CO2 35* 38* 34*  GLUCOSE 143* 148* 121*  BUN 34* 35* 40*  CREATININE 1.19 1.46* 1.45*  CALCIUM 8.4* 9.0 8.8*  PROT 6.2* 6.7 6.2*  ALBUMIN 3.0* 3.2* 3.0*  AST 23 21 22   ALT 28 29 29   ALKPHOS 63 59 52  BILITOT 0.9 1.0 0.8  GFRNONAA 59* 46* 47*  GFRAA >60 54* 54*  ANIONGAP 11 9 10     Hematology Recent Labs  Lab 08/15/19 0256 08/16/19 0253 08/17/19 0253   WBC 8.2 6.2 7.6  RBC 5.28 5.47 5.63  HGB 16.0 16.4 16.8  HCT 50.3 51.3 52.3*  MCV 95.3 93.8 92.9  MCH 30.3 30.0 29.8  MCHC 31.8 32.0 32.1  RDW 13.7 13.8 13.7  PLT 100* 104* 129*   BNP Recent Labs  Lab 08/14/19 0252  BNP 1,462.8*    DDimer  Recent Labs  Lab 08/15/19 0256 08/16/19 0253 08/17/19 0253  DDIMER 0.49 0.49 0.55*     Radiology  DG CHEST PORT 1 VIEW  Result Date: 08/17/2019 CLINICAL DATA:  Shortness of breath EXAM: PORTABLE CHEST 1 VIEW COMPARISON:  08/12/2019 FINDINGS: Cardiomegaly. Bibasilar atelectasis. No effusions or edema. No acute bony abnormality. IMPRESSION: Cardiomegaly, bibasilar atelectasis. Electronically Signed   By: Rolm Baptise M.D.   On: 08/17/2019 07:52    Cardiac Studies  TTE 08/13/2019 1. Left ventricular ejection fraction, by estimation, is <20%. The left  ventricle has severely decreased function. The left ventricle demonstrates  global hypokinesis. The left ventricular internal cavity size was mildly  dilated. There is mild concentric  left ventricular hypertrophy. Left ventricular diastolic function could  not be evaluated. Elevated left ventricular end-diastolic pressure.  2. Right ventricular systolic function is severely reduced. The right  ventricular size is severely enlarged. There is moderately elevated  pulmonary artery systolic pressure.  3. The mitral valve is normal in structure. Mild mitral valve  regurgitation. No evidence of mitral stenosis.  4. The aortic valve is tricuspid. Aortic valve regurgitation is trivial.  Mild to moderate aortic valve sclerosis/calcification is present, without  any evidence of aortic stenosis.  5. Aortic dilatation noted. 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  6. The inferior vena cava is dilated in size with <50% respiratory  variability, suggesting right atrial pressure of 15 mmHg.  7. Left atrial size was  severely dilated.  8. Right atrial size was mildly dilated.   Patient Profile  Ricky Hayden is a 74 y.o. male with COPD, systolic HF, brain abscess in 1995, seizure disorder, gout HTN, CKD III admitted 4/10 with covid PNA and decompensated HF.   Assessment & Plan   1. Acute hypoxic respiratory failure -2/2 covid and acute decompensated systolic HF -continue current covid medications  2. 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 LVOT VTI 9 cm.  -  volume status appears euvolemic today -continue torsemide 20 mg daily  -coreg back to 12.5 mg BID. Had bradycardia at 25 mg -cannot tolerate higher than entresto 24-26 mg BID; continue for now -continue aldactone 12.5 mg QD  -tolerating meds well without issues  -will need heath cath and BiV ICD eval as outpatient   3. Bradycardia -discussed with EP and likely non-conducted PACs -no indication of pacing -continue coreg 12.5 mg BID  4. HTN, with hypotension on admission -hypotension resolved   5. AKI on CKD stage III -continues to improve with diuresis   6. Hyperlipidemia  -switched to crestor 20 mg QD   7. Prolonged QTc  -LBBB 170 ms. No issues   [facility] will sign off.   Medication Recommendations:  Coreg 12.5 mg BID, entresto 24-26 mg BID, aldactone 12.5 mg QD, torsemide 20 mg QD  Other recommendations (labs, testing, etc):  none Follow up as an outpatient:  We will go ahead and arrange outpatient CHF clinic in 3-4 weeks  For questions or updates, please contact [facility] Please consult [email or web address] for contact info under   Time Spent with Patient: I have spent a total of 25 minutes with patient reviewing hospital notes, telemetry, EKGs, labs and examining the patient as well as establishing an assessment and plan that was discussed with the patient.  > 50% of time was spent in direct patient care.    Signed, Addison Naegeli. Audie Box,  [place]  08/18/2019 9:16 AM

## 2019-08-19 DIAGNOSIS — U071 COVID-19: Secondary | ICD-10-CM | POA: Diagnosis not present

## 2019-08-19 DIAGNOSIS — N400 Enlarged prostate without lower urinary tract symptoms: Secondary | ICD-10-CM | POA: Diagnosis not present

## 2019-08-19 DIAGNOSIS — N1832 Chronic kidney disease, stage 3b: Secondary | ICD-10-CM | POA: Diagnosis not present

## 2019-08-19 DIAGNOSIS — J441 Chronic obstructive pulmonary disease with (acute) exacerbation: Secondary | ICD-10-CM | POA: Diagnosis not present

## 2019-08-19 LAB — BASIC METABOLIC PANEL
Anion gap: 12 (ref 5–15)
BUN: 33 mg/dL — ABNORMAL HIGH (ref 8–23)
CO2: 34 mmol/L — ABNORMAL HIGH (ref 22–32)
Calcium: 9.3 mg/dL (ref 8.9–10.3)
Chloride: 95 mmol/L — ABNORMAL LOW (ref 98–111)
Creatinine, Ser: 1.36 mg/dL — ABNORMAL HIGH (ref 0.61–1.24)
GFR calc Af Amer: 59 mL/min — ABNORMAL LOW (ref >60)
GFR calc non Af Amer: 51 mL/min — ABNORMAL LOW (ref >60)
Glucose, Bld: 102 mg/dL — ABNORMAL HIGH (ref 70–99)
Potassium: 4.1 mmol/L (ref 3.5–5.1)
Sodium: 141 mmol/L (ref 135–145)

## 2019-08-19 LAB — GLUCOSE, CAPILLARY
Glucose-Capillary: 106 mg/dL — ABNORMAL HIGH (ref 70–99)
Glucose-Capillary: 137 mg/dL — ABNORMAL HIGH (ref 70–99)

## 2019-08-19 MED ORDER — SACUBITRIL-VALSARTAN 24-26 MG PO TABS: 1.0000 | ORAL_TABLET | Freq: Two times a day (BID) | ORAL | 0 refills | Status: DC

## 2019-08-19 MED ORDER — TORSEMIDE 20 MG PO TABS: 20.0000 mg | ORAL_TABLET | Freq: Two times a day (BID) | ORAL | 0 refills | Status: DC

## 2019-08-19 MED ORDER — ORAL CARE MOUTH RINSE
15.0000 mL | Freq: Two times a day (BID) | OROMUCOSAL | Status: DC
  Administered 2019-08-19: 15 mL via OROMUCOSAL

## 2019-08-19 MED ORDER — CARVEDILOL 12.5 MG PO TABS: 12.5000 mg | ORAL_TABLET | Freq: Two times a day (BID) | ORAL | 0 refills | Status: DC

## 2019-08-19 MED ORDER — SPIRONOLACTONE 25 MG PO TABS: 12.5000 mg | ORAL_TABLET | Freq: Every day | ORAL | 0 refills | Status: DC

## 2019-08-19 NOTE — Progress Notes (Signed)
This RN spoke to patient's daughter, [name]. Daughter states that [facility] that contacted her [facility] not accept [facility]. Ronalee Belts, Charge RN notified. Daughter states that 76 year old granddaughter will assist patient at home. Several questions answered. Daughter asking for patient to be assisted with shower in AM before discharge. This RN notified Kennyth Lose, NT to report to dayshift NT. Will continue to monitor.

## 2019-08-19 NOTE — Discharge Instructions (Signed)
Person Under Monitoring Name: Ricky Hayden  Location: [place] [address]   Infection Prevention Recommendations for Individuals Confirmed to have, or Being Evaluated for, 2019 Novel Coronavirus (COVID-19) Infection Who Receive Care at Home  Individuals who are confirmed to have, or are being evaluated for, COVID-19 should follow the prevention steps below until a healthcare provider or local or state health department says they can return to normal activities.  Stay home except to get medical care You should restrict activities outside your home, except for getting medical care. Do not go to work, school, or public areas, and do not use public transportation or taxis.  Call ahead before visiting your doctor Before your medical appointment, call the healthcare provider and tell them that you have, or are being evaluated for, COVID-19 infection. This will help the healthcare provider's office take steps to keep other people from getting infected. Ask your healthcare provider to call the local or state health department.  Monitor your symptoms Seek prompt medical attention if your illness is worsening (e.g., difficulty breathing). Before going to your medical appointment, call the healthcare provider and tell them that you have, or are being evaluated for, COVID-19 infection. Ask your healthcare provider to call the local or state health department.  Wear a facemask You should wear a facemask that covers your nose and mouth when you are in the same room with other people and when you visit a healthcare provider. People who live with or visit you should also wear a facemask while they are in the same room with you.  Separate yourself from other people in your home As much as possible, you should stay in a different room from other people in your home. Also, you should use a separate bathroom, if available.  Avoid sharing household items You should not  share dishes, drinking glasses, cups, eating utensils, towels, bedding, or other items with other people in your home. After using these items, you should wash them thoroughly with soap and water.  Cover your coughs and sneezes Cover your mouth and nose with a tissue when you cough or sneeze, or you can cough or sneeze into your sleeve. Throw used tissues in a lined trash can, and immediately wash your hands with soap and water for at least 20 seconds or use an alcohol-based hand rub.  Wash your [facility] your hands often and thoroughly with soap and water for at least 20 seconds. You can use an alcohol-based hand sanitizer if soap and water are not available and if your hands are not visibly dirty. Avoid touching your eyes, nose, and mouth with unwashed hands.   Prevention Steps for Caregivers and Household Members of Individuals Confirmed to have, or Being Evaluated for, COVID-19 Infection Being Cared for in the Home  If you live with, or provide care at home for, a person confirmed to have, or being evaluated for, COVID-19 infection please follow these guidelines to prevent infection:  Follow healthcare provider's instructions Make sure that you understand and can help the patient follow any healthcare provider instructions for all care.  Provide for the patient's basic needs You should help the patient with basic needs in the home and provide support for getting groceries, prescriptions, and other personal needs.  Monitor the patient's symptoms If they are getting sicker, call his or her medical provider and tell them that the patient has, or is being evaluated for, COVID-19 infection. This will help the healthcare provider's office  take steps to keep other people from getting infected. Ask the healthcare provider to call the local or state health department.  Limit the number of people who have contact with the patient  If possible, have only one caregiver for the  patient.  Other household members should stay in another home or place of residence. If this is not possible, they should stay  in another room, or be separated from the patient as much as possible. Use a separate bathroom, if available.  Restrict visitors who do not have an essential need to be in the home.  Keep older adults, very young children, and other sick people away from the patient Keep older adults, very young children, and those who have compromised immune systems or chronic health conditions away from the patient. This includes people with chronic heart, lung, or kidney conditions, diabetes, and cancer.  Ensure good ventilation Make sure that shared spaces in the home have good air flow, such as from an air conditioner or an opened window, weather permitting.  Wash your hands often  Wash your hands often and thoroughly with soap and water for at least 20 seconds. You can use an alcohol based hand sanitizer if soap and water are not available and if your hands are not visibly dirty.  Avoid touching your eyes, nose, and mouth with unwashed hands.  Use disposable paper towels to dry your hands. If not available, use dedicated cloth towels and replace them when they become wet.  Wear a facemask and gloves  Wear a disposable facemask at all times in the room and gloves when you touch or have contact with the patient's blood, body fluids, and/or secretions or excretions, such as sweat, saliva, sputum, nasal mucus, vomit, urine, or feces.  Ensure the mask fits over your nose and mouth tightly, and do not touch it during use.  Throw out disposable facemasks and gloves after using them. Do not reuse.  Wash your hands immediately after removing your facemask and gloves.  If your personal clothing becomes contaminated, carefully remove clothing and launder. Wash your hands after handling contaminated clothing.  Place all used disposable facemasks, gloves, and other waste in a lined  container before disposing them with other household waste.  Remove gloves and wash your hands immediately after handling these items.  Do not share dishes, glasses, or other household items with the patient  Avoid sharing household items. You should not share dishes, drinking glasses, cups, eating utensils, towels, bedding, or other items with a patient who is confirmed to have, or being evaluated for, COVID-19 infection.  After the person uses these items, you should wash them thoroughly with soap and water.  Wash laundry thoroughly  Immediately remove and wash clothes or bedding that have blood, body fluids, and/or secretions or excretions, such as sweat, saliva, sputum, nasal mucus, vomit, urine, or feces, on them.  Wear gloves when handling laundry from the patient.  Read and follow directions on labels of laundry or clothing items and detergent. In general, wash and dry with the warmest temperatures recommended on the label.  Clean all areas the individual has used often  Clean all touchable surfaces, such as counters, tabletops, doorknobs, bathroom fixtures, toilets, phones, keyboards, tablets, and bedside tables, every day. Also, clean any surfaces that may have blood, body fluids, and/or secretions or excretions on them.  Wear gloves when cleaning surfaces the patient has come in contact with.  Use a diluted bleach solution (e.g., dilute bleach with 1 part  bleach and 10 parts water) or a household disinfectant with a label that says EPA-registered for coronaviruses. To make a bleach solution at home, add 1 tablespoon of bleach to 1 quart (4 cups) of water. For a larger supply, add  cup of bleach to 1 gallon (16 cups) of water.  Read labels of cleaning products and follow recommendations provided on product labels. Labels contain instructions for safe and effective use of the cleaning product including precautions you should take when applying the product, such as wearing gloves or  eye protection and making sure you have good ventilation during use of the product.  Remove gloves and wash hands immediately after cleaning.  Monitor yourself for signs and symptoms of illness Caregivers and household members are considered close contacts, should monitor their health, and will be asked to limit movement outside of the home to the extent possible. Follow the monitoring steps for close contacts listed on the symptom monitoring form.   ? If you have additional questions, contact your local health department or call the epidemiologist on call at [phone] (available 24/7). ? This guidance is subject to change. For the most up-to-date guidance from [facility], please refer to their website: YouBlogs.pl

## 2019-08-19 NOTE — TOC Transition Note (Signed)
Transition of Care [facility]) - CM/SW Discharge Note   Patient Details  Name: Ricky Hayden MRN: [id number] Date of Birth: [birth year]  [facility] CM/SW Contact:  Carles Collet, RN Phone Number: 08/19/2019, 10:34 AM   Clinical Narrative:    Patient to DC to home today. [facility] set up to do twice weekly leg wraps. [employer] to deliver oxygen to room and house. Family to provide transport home    Final next level of care: [place] Barriers to Discharge: Continued Medical Work up, SNF Pending bed offer   Patient Goals and CMS Choice Patient states their goals for this hospitalization and ongoing recovery are:: Rehab CMS Medicare.gov Compare Post Acute Care list provided to:: Patient Represenative (must comment)(Daughter, [name]) Choice offered to / list presented to : Adult Children  Discharge Placement                       Discharge Plan and Services In-house Referral: Clinical Social Work   Post Acute Care Choice: Combine                               Social Determinants of Health (SDOH) Interventions     Readmission Risk Interventions Readmission Risk Prevention Plan 08/18/2019 08/17/2019  Transportation Screening Complete Complete  PCP or Specialist Appt within 5-7 Days - Complete  PCP or Specialist Appt within 3-5 Days Complete -  Home Care Screening - Complete  Medication Review (RN CM) - Complete  HRI or Home Care Consult Complete -  Social Work Consult for Recovery Care Planning/Counseling Complete -  Palliative Care Screening Complete -  Medication Review Press photographer) Complete -  Some recent data might be hidden

## 2019-08-19 NOTE — Progress Notes (Addendum)
Pt given discharge instructions, prescriptions, and care notes. Also has home O2 tank to take home, Pt instructed how to use. Pt verbalized understanding AEB no further questions or concerns at this time. IV was discontinued, no redness, pain, or swelling noted at this time. Telemetry discontinued and Centralized Telemetry was notified. Pt left the floor via wheelchair with staff in stable condition.  Also spoke with Pt's daughter and reviewed AVS, meds, prescriptions over the phone. Answered all questions/concerns.

## 2019-08-19 NOTE — Discharge Summary (Signed)
PATIENT DETAILS Name: Ricky Hayden Age: 74 y.o. Sex: male Date of Birth: 02-16-1946 MRN: [id number]. Admitting Physician: Lavina Hamman, MD UXL:[id number], No Pcp Per  Admit Date: 08/12/2019 Discharge date: 08/19/2019  Recommendations for Outpatient Follow-up:  1. Follow up with PCP in 1-2 weeks 2. Please obtain CMP/CBC in one week 3. Repeat Chest Xray in 4-6 week 4. Please follow follow-up with cardiology 5. Please reassess O2 requirement at next visit.  Admitted From:  Home  Disposition: Home with home health services (patient/family refused SNF)   Home Health:  Yes  Equipment/Devices: oxygen 2L  Discharge Condition: Stable  CODE STATUS: FULL CODE  Diet recommendation:  Diet Order            Diet - low sodium heart healthy        Diet Heart Room service appropriate? Yes; Fluid consistency: Thin  Diet effective now               Brief Narrative: Patient is a 74 y.o. male with PMHx of chronic systolic heart failure, CAD, BPH, GERD, CKD stage IIIb, COPD hypotension and acute hypoxic respiratory failure in the setting of decompensated systolic heart failure and COVID-19 pneumonia.  See below for further details.  Significant Events: 4/10>> admit to [facility] 4/11>> EF < 20%, severe RV systolic dysfunction   [id number] medications: Steroids: 4/10>>4/14 Remdesivir: 4/10>> 4/14  Antibiotics: None  Microbiology data: 4/10>> Blood cultures: Negative  Procedures: None  Consults: Cardiology  Brief Hospital Course: Acute Hypoxic Resp Failure due to Covid 19 Viral pneumonia decompensated systolic heart failure: Improved-on room air.    Has completed a course of steroids and remdesivir.  CRP only very minimally elevated.  Volume status is markedly improved-cardiology following-continue Aldactone, Coreg, Entresto and Lasix.  Cardiology will arrange for outpatient follow-up post discharge-for further work-up including LHC.  Being discharged on home O2-PCP  will need to assess if he still requires oxygen at next visit.  He also has underlying COPD.  COVID-19 Labs:  Recent Labs    08/17/19 0253  DDIMER 0.55*  CRP 1.1*    Lab Results  Component Value Date   SARSCOV2NAA NOT DETECTED 10/21/2018    Sinus bradycardia: Brief run of sinus bradycardia down to the 30s on 4/15 morning-no further runs of bradycardia-beta-blocker dosage has been decreased.  Cardiology follow during this hospital stay  COPD: Appears stable-no wheezing-continue bronchodilators-stopped steroids 4/16.  CKD stage IIIb: Creatinine close to usual baseline-continue to monitor closely.  HTN: BP stable-continue Coreg and Entresto  HLD: Continue statin  GERD: Continue PPI  History of seizure disorder: Continue phenobarbital  BPH: Stable-continue Flomax and Proscar  History of chronic lower extremity edema: Compressive wrappings in place-home RN has been arranged.  Volume status has improved with diuretic regimen.    History of TBI: Suspect at baseline   Obesity: Estimated body mass index is 36.49 kg/m as calculated from the following:   Height as of this encounter: 5\' 8"  (1.727 m).   Weight as of this encounter: 108.9 kg.   Obesity: Estimated body mass index is 35.43 kg/m as calculated from the following:   Height as of this encounter: 5\' 8"  (1.727 m).   Weight as of this encounter: 105.7 kg.    Discharge Diagnoses:  Principal Problem:   Acute hypoxemic respiratory failure due to COVID-19 [facility]) Active Problems:   Hyperlipidemia   Gout   Essential hypertension   GERD   BPH (benign prostatic hyperplasia)   Traumatic brain injury ([place])  Leg swelling   Chronic combined systolic and diastolic CHF, NYHA class 2 (HCC)   CKD (chronic kidney disease) stage 3, GFR 30-59 ml/min   COPD with acute exacerbation [facility])   Discharge Instructions:    Person Under Monitoring Name: Finnean Cerami  Location: [place]  [address]   Infection Prevention Recommendations for Individuals Confirmed to have, or Being Evaluated for, 2019 Novel Coronavirus (COVID-19) Infection Who Receive Care at Home  Individuals who are confirmed to have, or are being evaluated for, COVID-19 should follow the prevention steps below until a healthcare provider or local or state health department says they can return to normal activities.  Stay home except to get medical care You should restrict activities outside your home, except for getting medical care. Do not go to work, school, or public areas, and do not use public transportation or taxis.  Call ahead before visiting your doctor Before your medical appointment, call the healthcare provider and tell them that you have, or are being evaluated for, COVID-19 infection. This will help the healthcare providers office take steps to keep other people from getting infected. Ask your healthcare provider to call the local or state health department.  Monitor your symptoms Seek prompt medical attention if your illness is worsening (e.g., difficulty breathing). Before going to your medical appointment, call the healthcare provider and tell them that you have, or are being evaluated for, COVID-19 infection. Ask your healthcare provider to call the local or state health department.  Wear a facemask You should wear a facemask that covers your nose and mouth when you are in the same room with other people and when you visit a healthcare provider. People who live with or visit you should also wear a facemask while they are in the same room with you.  Separate yourself from other people in your home As much as possible, you should stay in a different room from other people in your home. Also, you should use a separate bathroom, if available.  Avoid sharing household items You should not share dishes, drinking glasses, cups, eating utensils, towels, bedding, or other items with other  people in your home. After using these items, you should wash them thoroughly with soap and water.  Cover your coughs and sneezes Cover your mouth and nose with a tissue when you cough or sneeze, or you can cough or sneeze into your sleeve. Throw used tissues in a lined trash can, and immediately wash your hands with soap and water for at least 20 seconds or use an alcohol-based hand rub.  Wash your [facility] your hands often and thoroughly with soap and water for at least 20 seconds. You can use an alcohol-based hand sanitizer if soap and water are not available and if your hands are not visibly dirty. Avoid touching your eyes, nose, and mouth with unwashed hands.   Prevention Steps for Caregivers and Household Members of Individuals Confirmed to have, or Being Evaluated for, COVID-19 Infection Being Cared for in the Home  If you live with, or provide care at home for, a person confirmed to have, or being evaluated for, COVID-19 infection please follow these guidelines to prevent infection:  Follow healthcare providers instructions Make sure that you understand and can help the patient follow any healthcare provider instructions for all care.  Provide for the patients basic needs You should help the patient with basic needs in the home and provide support for getting groceries, prescriptions, and other personal  needs.  Monitor the patients symptoms If they are getting sicker, call his or her medical provider and tell them that the patient has, or is being evaluated for, COVID-19 infection. This will help the healthcare providers office take steps to keep other people from getting infected. Ask the healthcare provider to call the local or state health department.  Limit the number of people who have contact with the patient  If possible, have only one caregiver for the patient.  Other household members should stay in another home or place of residence. If this is not  possible, they should stay  in another room, or be separated from the patient as much as possible. Use a separate bathroom, if available.  Restrict visitors who do not have an essential need to be in the home.  Keep older adults, very young children, and other sick people away from the patient Keep older adults, very young children, and those who have compromised immune systems or chronic health conditions away from the patient. This includes people with chronic heart, lung, or kidney conditions, diabetes, and cancer.  Ensure good ventilation Make sure that shared spaces in the home have good air flow, such as from an air conditioner or an opened window, weather permitting.  Wash your hands often  Wash your hands often and thoroughly with soap and water for at least 20 seconds. You can use an alcohol based hand sanitizer if soap and water are not available and if your hands are not visibly dirty.  Avoid touching your eyes, nose, and mouth with unwashed hands.  Use disposable paper towels to dry your hands. If not available, use dedicated cloth towels and replace them when they become wet.  Wear a facemask and gloves  Wear a disposable facemask at all times in the room and gloves when you touch or have contact with the patients blood, body fluids, and/or secretions or excretions, such as sweat, saliva, sputum, nasal mucus, vomit, urine, or feces.  Ensure the mask fits over your nose and mouth tightly, and do not touch it during use.  Throw out disposable facemasks and gloves after using them. Do not reuse.  Wash your hands immediately after removing your facemask and gloves.  If your personal clothing becomes contaminated, carefully remove clothing and launder. Wash your hands after handling contaminated clothing.  Place all used disposable facemasks, gloves, and other waste in a lined container before disposing them with other household waste.  Remove gloves and wash your hands  immediately after handling these items.  Do not share dishes, glasses, or other household items with the patient  Avoid sharing household items. You should not share dishes, drinking glasses, cups, eating utensils, towels, bedding, or other items with a patient who is confirmed to have, or being evaluated for, COVID-19 infection.  After the person uses these items, you should wash them thoroughly with soap and water.  Wash laundry thoroughly  Immediately remove and wash clothes or bedding that have blood, body fluids, and/or secretions or excretions, such as sweat, saliva, sputum, nasal mucus, vomit, urine, or feces, on them.  Wear gloves when handling laundry from the patient.  Read and follow directions on labels of laundry or clothing items and detergent. In general, wash and dry with the warmest temperatures recommended on the label.  Clean all areas the individual has used often  Clean all touchable surfaces, such as counters, tabletops, doorknobs, bathroom fixtures, toilets, phones, keyboards, tablets, and bedside tables, every day. Also, clean any  surfaces that may have blood, body fluids, and/or secretions or excretions on them.  Wear gloves when cleaning surfaces the patient has come in contact with.  Use a diluted bleach solution (e.g., dilute bleach with 1 part bleach and 10 parts water) or a household disinfectant with a label that says EPA-registered for coronaviruses. To make a bleach solution at home, add 1 tablespoon of bleach to 1 quart (4 cups) of water. For a larger supply, add  cup of bleach to 1 gallon (16 cups) of water.  Read labels of cleaning products and follow recommendations provided on product labels. Labels contain instructions for safe and effective use of the cleaning product including precautions you should take when applying the product, such as wearing gloves or eye protection and making sure you have good ventilation during use of the product.  Remove  gloves and wash hands immediately after cleaning.  Monitor yourself for signs and symptoms of illness Caregivers and household members are considered close contacts, should monitor their health, and will be asked to limit movement outside of the home to the extent possible. Follow the monitoring steps for close contacts listed on the symptom monitoring form.   ? If you have additional questions, contact your local health department or call the epidemiologist on call at [phone] (available 24/7). ? This guidance is subject to change. For the most up-to-date guidance from CDC, please refer to their website: YouBlogs.pl    Activity:  As tolerated with Full fall precautions use walker/cane & assistance as needed   Discharge Instructions    (HEART FAILURE PATIENTS) Call MD:  Anytime you have any of the following symptoms: 1) 3 pound weight gain in 24 hours or 5 pounds in 1 week 2) shortness of breath, with or without a dry hacking cough 3) swelling in the hands, feet or stomach 4) if you have to sleep on extra pillows at night in order to breathe.   Complete by: As directed    Avoid straining   Complete by: As directed    Call MD for:  difficulty breathing, headache or visual disturbances   Complete by: As directed    Call MD for:  extreme fatigue   Complete by: As directed    Call MD for:  persistant dizziness or light-headedness   Complete by: As directed    Diet - low sodium heart healthy   Complete by: As directed    Discharge instructions   Complete by: As directed    1.)  3 weeks of isolation from 4/10  2.)  You are being discharged on oxygen-please use it 24/7-please ask your primary care practitioner at your next visit whether you still require oxygen or not.  3.)  You should get a call from cardiology office-if you do not hear from them-please give them a call   Heart Failure patients record your daily weight  using the same scale at the same time of day   Complete by: As directed    Increase activity slowly   Complete by: As directed    STOP any activity that causes chest pain, shortness of breath, dizziness, sweating, or exessive weakness   Complete by: As directed      Allergies as of 08/19/2019      Reactions   Calcium Channel Blockers Other (See Comments)   Came to hospital in 1995-caused chest pain      Medication List    STOP taking these medications   cloNIDine 0.3 MG tablet Commonly  known as: CATAPRES   furosemide 20 MG tablet Commonly known as: LASIX     TAKE these medications   albuterol 108 (90 Base) MCG/ACT inhaler Commonly known as: VENTOLIN HFA Inhale 2 puffs into the lungs every 6 (six) hours as needed for wheezing.   allopurinol 100 MG tablet Commonly known as: Zyloprim Take 2 tablets (200 mg total) by mouth daily.   budesonide-formoterol 80-4.5 MCG/ACT inhaler Commonly known as: Symbicort Inhale 2 puffs into the lungs 2 (two) times daily.   carvedilol 12.5 MG tablet Commonly known as: COREG Take 1 tablet (12.5 mg total) by mouth 2 (two) times daily with a meal. What changed:   medication strength  how much to take   diclofenac Sodium 1 % Gel Commonly known as: VOLTAREN Apply 2 g topically daily as needed (pain).   docusate sodium 100 MG capsule Commonly known as: COLACE Take 1-2 capsules (100-200 mg total) by mouth 2 (two) times daily as needed for mild constipation.   finasteride 5 MG tablet Commonly known as: PROSCAR TAKE ONE TABLET BY MOUTH ONCE DAILY   ICAPS AREDS 2 PO Take 2 capsules by mouth daily.   omeprazole 20 MG capsule Commonly known as: PRILOSEC Take 1 capsule (20 mg total) by mouth daily.   PHENobarbital 64.8 MG tablet Commonly known as: LUMINAL Take 1 tablet (64.8 mg total) by mouth 2 (two) times daily.   polyethylene glycol 17 g packet Commonly known as: MIRALAX / GLYCOLAX Take 17 g by mouth daily as needed for moderate  constipation.   pravastatin 40 MG tablet Commonly known as: PRAVACHOL Take 1 tablet (40 mg total) by mouth every evening.   sacubitril-valsartan 24-26 MG Commonly known as: ENTRESTO Take 1 tablet by mouth 2 (two) times daily.   Spiriva HandiHaler 18 MCG inhalation capsule Generic drug: tiotropium INHALE ONE DOSE BY MOUTH ONCE DAILY What changed: See the new instructions.   spironolactone 25 MG tablet Commonly known as: ALDACTONE Take 0.5 tablets (12.5 mg total) by mouth daily.   tamsulosin 0.4 MG Caps capsule Commonly known as: FLOMAX Take 1 capsule (0.4 mg total) by mouth daily.   torsemide 20 MG tablet Commonly known as: DEMADEX Take 1 tablet (20 mg total) by mouth 2 (two) times daily. What changed: how much to take            Durable Medical Equipment  (From admission, onward)         Start     Ordered   08/18/19 1228  For home use only DME oxygen  Once    Question Answer Comment  Length of Need 6 Months   Mode or (Route) Nasal cannula   Liters per Minute 2   Frequency Continuous (stationary and portable oxygen unit needed)   Oxygen conserving device Yes   Oxygen delivery system Gas      08/18/19 1227         Follow-up Information    [facility] Call.   Why: Follow up with Dr. Domenica Fail. Your social worker there is Kellie Simmering [phone] ex [phone]). Contact information: [place] [address] [phone]        [facility] Follow up.   Specialty: Home Health Services Why: home health  Contact information: [place] [address] [phone]        [facility] Follow up.   Why: Home Oxygen       Bensimhon, Shaune Pascal, MD Follow up on 09/06/2019.  Specialty: Cardiology Why: appt at 11:00 am Contact information: [address] [place] [address] [phone]        Deboraha Sprang, MD. Schedule an appointment as soon as possible for a visit in  2 week(s).   Specialty: Cardiology Contact information: [address] [place] [address] [phone]          Allergies  Allergen Reactions   Calcium Channel Blockers Other (See Comments)    Came to hospital in 1995-caused chest pain     Other Procedures/Studies: DG CHEST PORT 1 VIEW  Result Date: 08/17/2019 CLINICAL DATA:  Shortness of breath EXAM: PORTABLE CHEST 1 VIEW COMPARISON:  08/12/2019 FINDINGS: Cardiomegaly. Bibasilar atelectasis. No effusions or edema. No acute bony abnormality. IMPRESSION: Cardiomegaly, bibasilar atelectasis. Electronically Signed   By: Rolm Baptise M.D.   On: 08/17/2019 07:52   DG Chest Port 1 View  Result Date: 08/12/2019 CLINICAL DATA:  Shortness of breath, COVID positive EXAM: PORTABLE CHEST 1 VIEW COMPARISON:  06/29/2018 FINDINGS: Mild left basilar opacity, likely atelectasis. Lungs otherwise clear in this patient with known COVID. No pleural effusion or pneumothorax. Cardiomegaly.  Mild thoracic aortic atherosclerosis. IMPRESSION: Mild left basilar opacity, likely atelectasis. Lungs otherwise clear in this patient with known COVID. Cardiomegaly.  Thoracic aortic atherosclerosis. Electronically Signed   By: Julian Hy M.D.   On: 08/12/2019 16:08   ECHOCARDIOGRAM COMPLETE  Result Date: 08/13/2019    ECHOCARDIOGRAM REPORT   Patient Name:   KALVIN BUSS Date of Exam: 08/13/2019 Medical Rec #:  [id number]     Height:       68.0 in Accession #:    [id number]    Weight:       251.0 lb Date of Birth:  06-Jul-1945    BSA:          2.251 m Patient Age:    10 years      BP:           118/87 mmHg Patient Gender: M             HR:           88 bpm. Exam Location:  Inpatient Procedure: 2D Echo, Cardiac Doppler, Color Doppler and Intracardiac            Opacification Agent Indications:    I50.9* Heart failure (unspecified)  History:        Patient has prior history of Echocardiogram examinations, most                 recent 04/15/2015.  CHF, CAD, COPD; Risk Factors:Hypertension and                 Dyslipidemia. Covid 19 positive. Hypoxia.  Sonographer:    Roseanna Rainbow RDCS Referring Phys: [id number] Brenda  Sonographer Comments: Technically difficult study due to poor echo windows. Image acquisition challenging due to patient body habitus. IMPRESSIONS  1. Left ventricular ejection fraction, by estimation, is <20%. The left ventricle has severely decreased function. The left ventricle demonstrates global hypokinesis. The left ventricular internal cavity size was mildly dilated. There is mild concentric  left ventricular hypertrophy. Left ventricular diastolic function could not be evaluated. Elevated left ventricular end-diastolic pressure.  2. Right ventricular systolic function is severely reduced. The right ventricular size is severely enlarged. There is moderately elevated pulmonary artery systolic pressure.  3. The mitral valve is normal in structure. Mild mitral valve regurgitation. No evidence of mitral stenosis.  4. The aortic valve is tricuspid. Aortic valve regurgitation is trivial. Mild to moderate aortic valve sclerosis/calcification is present, without any evidence of aortic stenosis.  5. Aortic dilatation noted. 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  6. The inferior vena cava is dilated in size with <50% respiratory variability, suggesting right atrial pressure of 15 mmHg.  7. Left atrial size was severely dilated.  8. Right atrial size was mildly dilated. FINDINGS  Left Ventricle: Left ventricular ejection fraction, by estimation, is <20%. The left ventricle has severely decreased function. The left ventricle demonstrates global hypokinesis. The left ventricular internal cavity size was mildly dilated. There is mild concentric left ventricular hypertrophy. Abnormal (paradoxical) septal motion, consistent with left bundle branch block. Left ventricular diastolic  function could not be evaluated. Elevated left ventricular end-diastolic pressure. Right Ventricle: The right ventricular size is severely enlarged. No increase in right ventricular wall thickness. Right ventricular systolic function is severely reduced. There is moderately elevated pulmonary artery systolic pressure. The tricuspid regurgitant velocity is 3.01 m/s, and with an assumed right atrial pressure of 15 mmHg, the estimated right ventricular systolic pressure is 92.4 mmHg. Left Atrium: Left atrial size was severely dilated. Right Atrium: Right atrial size was mildly dilated. Pericardium: There is no evidence of pericardial effusion. Mitral Valve: The mitral valve is normal in structure. Normal mobility of the mitral valve leaflets. Moderate mitral annular calcification. Mild mitral valve regurgitation. No evidence of mitral valve stenosis. Tricuspid Valve: The tricuspid valve is normal in structure. Tricuspid valve regurgitation is mild . No evidence of tricuspid stenosis. Aortic Valve: The aortic valve is tricuspid. Aortic valve regurgitation is trivial. Mild to moderate aortic valve sclerosis/calcification is present, without any evidence of aortic stenosis. Mild to moderate aortic valve annular calcification. Pulmonic Valve: The pulmonic valve was normal in structure. Pulmonic valve regurgitation is mild. No evidence of pulmonic stenosis. Aorta: The aortic root is normal in size and structure and aortic dilatation noted. 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 Venous: The inferior vena cava is dilated in size with less than 50% respiratory variability, suggesting right atrial pressure of 15 mmHg. IAS/Shunts: No atrial level shunt detected by color flow Doppler.  LEFT VENTRICLE PLAX 2D LVIDd:         6.10 cm      Diastology LVIDs:         5.70 cm      LV e' lateral:   4.79 cm/s LV PW:         1.70 cm      LV E/e' lateral: 18.2 LV IVS:        1.20 cm      LV  e' medial:    3.26 cm/s LVOT diam:     2.30 cm      LV E/e' medial:  26.8 LV SV:         38 LV SV Index:   17 LVOT Area:     4.15 cm  LV Volumes (MOD) LV vol d, MOD A2C: 216.0 ml LV vol d, MOD A4C: 186.0 ml LV vol s, MOD A2C: 188.0 ml LV vol s, MOD A4C: 173.0 ml LV SV MOD A2C:     28.0 ml LV SV MOD A4C:     186.0 ml LV SV MOD BP:      18.8 ml RIGHT VENTRICLE  IVC RV S prime:     7.07 cm/s  IVC diam: 2.80 cm TAPSE (M-mode): 1.1 cm LEFT ATRIUM              Index       RIGHT ATRIUM           Index LA diam:        5.00 cm  2.22 cm/m  RA Area:     24.20 cm LA Vol (A2C):   113.0 ml 50.20 ml/m RA Volume:   83.40 ml  37.05 ml/m LA Vol (A4C):   112.0 ml 49.76 ml/m LA Biplane Vol: 114.0 ml 50.65 ml/m  AORTIC VALVE             PULMONIC VALVE LVOT Vmax:   69.80 cm/s  PV Vmax:          2.25 m/s LVOT Vmean:  49.900 cm/s PV Peak grad:     20.2 mmHg LVOT VTI:    0.092 m     PR End Diast Vel: 2.31 msec  AORTA Ao Root diam: 4.10 cm Ao Asc diam:  4.10 cm MITRAL VALVE               TRICUSPID VALVE MV Area (PHT): 3.17 cm    TR Peak grad:   36.2 mmHg MV Decel Time: 239 msec    TR Vmax:        301.00 cm/s MV E velocity: 87.40 cm/s                            SHUNTS                            Systemic VTI:  0.09 m                            Systemic Diam: 2.30 cm Fransico Him MD Electronically signed by Fransico Him MD Signature Date/Time: 08/13/2019/3:03:36 PM    Final      TODAY-DAY OF DISCHARGE:  Subjective:   [name] today has no headache,no chest abdominal pain,no new weakness tingling or numbness, feels much better wants to go home today.   Objective:   Blood pressure 121/77, pulse 67, temperature (!) 97.5 F (36.4 C), temperature source Oral, resp. rate 17, height 5\' 8"  (1.727 m), weight 105.7 kg, SpO2 95 %.  Intake/Output Summary (Last 24 hours) at 08/19/2019 0957 Last data filed at 08/19/2019 0700 Gross per 24 hour  Intake 360 ml  Output 1150 ml  Net -790 ml   Filed Weights   08/17/19  0700 08/18/19 0724 08/19/19 0454  Weight: 108.6 kg 108.9 kg 105.7 kg    Exam: Awake Alert, Oriented *3, No new F.N deficits, Normal affect [place].AT,PERRAL Supple Neck,No JVD, No cervical lymphadenopathy appriciated.  Symmetrical Chest wall movement, Good air movement bilaterally, CTAB RRR,No Gallops,Rubs or new Murmurs, No Parasternal Heave +ve B.Sounds, Abd Soft, Non tender, No organomegaly appriciated, No rebound -guarding or rigidity. No Cyanosis, Clubbing or edema, No new Rash or bruise   PERTINENT RADIOLOGIC STUDIES: DG CHEST PORT 1 VIEW  Result Date: 08/17/2019 CLINICAL DATA:  Shortness of breath EXAM: PORTABLE CHEST 1 VIEW COMPARISON:  08/12/2019 FINDINGS: Cardiomegaly. Bibasilar atelectasis. No effusions or edema. No acute bony abnormality. IMPRESSION: Cardiomegaly, bibasilar atelectasis. Electronically Signed   By: Rolm Baptise M.D.   On: 08/17/2019 07:52  DG Chest Port 1 View  Result Date: 08/12/2019 CLINICAL DATA:  Shortness of breath, COVID positive EXAM: PORTABLE CHEST 1 VIEW COMPARISON:  06/29/2018 FINDINGS: Mild left basilar opacity, likely atelectasis. Lungs otherwise clear in this patient with known COVID. No pleural effusion or pneumothorax. Cardiomegaly.  Mild thoracic aortic atherosclerosis. IMPRESSION: Mild left basilar opacity, likely atelectasis. Lungs otherwise clear in this patient with known COVID. Cardiomegaly.  Thoracic aortic atherosclerosis. Electronically Signed   By: Julian Hy M.D.   On: 08/12/2019 16:08   ECHOCARDIOGRAM COMPLETE  Result Date: 08/13/2019    ECHOCARDIOGRAM REPORT   Patient Name:   Ricky Hayden Date of Exam: 08/13/2019 Medical Rec #:  [id number]     Height:       68.0 in Accession #:    [id number]    Weight:       251.0 lb Date of Birth:  02/03/1946    BSA:          2.251 m Patient Age:    45 years      BP:           118/87 mmHg Patient Gender: M             HR:           88 bpm. Exam Location:  Inpatient Procedure: 2D Echo, Cardiac  Doppler, Color Doppler and Intracardiac            Opacification Agent Indications:    I50.9* Heart failure (unspecified)  History:        Patient has prior history of Echocardiogram examinations, most                 recent 04/15/2015. CHF, CAD, COPD; Risk Factors:Hypertension and                 Dyslipidemia. Covid 19 positive. Hypoxia.  Sonographer:    Roseanna Rainbow RDCS Referring Phys: [id number] [place]  Sonographer Comments: Technically difficult study due to poor echo windows. Image acquisition challenging due to patient body habitus. IMPRESSIONS  1. Left ventricular ejection fraction, by estimation, is <20%. The left ventricle has severely decreased function. The left ventricle demonstrates global hypokinesis. The left ventricular internal cavity size was mildly dilated. There is mild concentric  left ventricular hypertrophy. Left ventricular diastolic function could not be evaluated. Elevated left ventricular end-diastolic pressure.  2. Right ventricular systolic function is severely reduced. The right ventricular size is severely enlarged. There is moderately elevated pulmonary artery systolic pressure.  3. The mitral valve is normal in structure. Mild mitral valve regurgitation. No evidence of mitral stenosis.  4. The aortic valve is tricuspid. Aortic valve regurgitation is trivial. Mild to moderate aortic valve sclerosis/calcification is present, without any evidence of aortic stenosis.  5. Aortic dilatation noted. 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  6. The inferior vena cava is dilated in size with <50% respiratory variability, suggesting right atrial pressure of 15 mmHg.  7. Left atrial size was severely dilated.  8. Right atrial size was mildly dilated. FINDINGS  Left Ventricle: Left ventricular ejection fraction, by estimation, is <20%. The left ventricle has severely decreased function. The left ventricle demonstrates global  hypokinesis. The left ventricular internal cavity size was mildly dilated. There is mild concentric left ventricular hypertrophy. Abnormal (paradoxical) septal motion, consistent with left bundle branch block. Left ventricular diastolic function could not be evaluated. Elevated left ventricular end-diastolic pressure. Right  Ventricle: The right ventricular size is severely enlarged. No increase in right ventricular wall thickness. Right ventricular systolic function is severely reduced. There is moderately elevated pulmonary artery systolic pressure. The tricuspid regurgitant velocity is 3.01 m/s, and with an assumed right atrial pressure of 15 mmHg, the estimated right ventricular systolic pressure is 92.3 mmHg. Left Atrium: Left atrial size was severely dilated. Right Atrium: Right atrial size was mildly dilated. Pericardium: There is no evidence of pericardial effusion. Mitral Valve: The mitral valve is normal in structure. Normal mobility of the mitral valve leaflets. Moderate mitral annular calcification. Mild mitral valve regurgitation. No evidence of mitral valve stenosis. Tricuspid Valve: The tricuspid valve is normal in structure. Tricuspid valve regurgitation is mild . No evidence of tricuspid stenosis. Aortic Valve: The aortic valve is tricuspid. Aortic valve regurgitation is trivial. Mild to moderate aortic valve sclerosis/calcification is present, without any evidence of aortic stenosis. Mild to moderate aortic valve annular calcification. Pulmonic Valve: The pulmonic valve was normal in structure. Pulmonic valve regurgitation is mild. No evidence of pulmonic stenosis. Aorta: The aortic root is normal in size and structure and aortic dilatation noted. 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 Venous: The inferior vena cava is dilated in size with less than 50% respiratory variability, suggesting right atrial pressure of 15 mmHg. IAS/Shunts: No atrial level  shunt detected by color flow Doppler.  LEFT VENTRICLE PLAX 2D LVIDd:         6.10 cm      Diastology LVIDs:         5.70 cm      LV e' lateral:   4.79 cm/s LV PW:         1.70 cm      LV E/e' lateral: 18.2 LV IVS:        1.20 cm      LV e' medial:    3.26 cm/s LVOT diam:     2.30 cm      LV E/e' medial:  26.8 LV SV:         38 LV SV Index:   17 LVOT Area:     4.15 cm  LV Volumes (MOD) LV vol d, MOD A2C: 216.0 ml LV vol d, MOD A4C: 186.0 ml LV vol s, MOD A2C: 188.0 ml LV vol s, MOD A4C: 173.0 ml LV SV MOD A2C:     28.0 ml LV SV MOD A4C:     186.0 ml LV SV MOD BP:      18.8 ml RIGHT VENTRICLE            IVC RV S prime:     7.07 cm/s  IVC diam: 2.80 cm TAPSE (M-mode): 1.1 cm LEFT ATRIUM              Index       RIGHT ATRIUM           Index LA diam:        5.00 cm  2.22 cm/m  RA Area:     24.20 cm LA Vol (A2C):   113.0 ml 50.20 ml/m RA Volume:   83.40 ml  37.05 ml/m LA Vol (A4C):   112.0 ml 49.76 ml/m LA Biplane Vol: 114.0 ml 50.65 ml/m  AORTIC VALVE             PULMONIC VALVE LVOT Vmax:   69.80 cm/s  PV Vmax:  2.25 m/s LVOT Vmean:  49.900 cm/s PV Peak grad:     20.2 mmHg LVOT VTI:    0.092 m     PR End Diast Vel: 2.31 msec  AORTA Ao Root diam: 4.10 cm Ao Asc diam:  4.10 cm MITRAL VALVE               TRICUSPID VALVE MV Area (PHT): 3.17 cm    TR Peak grad:   36.2 mmHg MV Decel Time: 239 msec    TR Vmax:        301.00 cm/s MV E velocity: 87.40 cm/s                            SHUNTS                            Systemic VTI:  0.09 m                            Systemic Diam: 2.30 cm Fransico Him MD Electronically signed by Fransico Him MD Signature Date/Time: 08/13/2019/3:03:36 PM    Final      PERTINENT LAB RESULTS: CBC: Recent Labs    08/17/19 0253  WBC 7.6  HGB 16.8  HCT 52.3*  PLT 129*   CMET CMP     Component Value Date/Time   NA 141 08/19/2019 0715   NA 144 06/27/2015 1627   K 4.1 08/19/2019 0715   CL 95 (L) 08/19/2019 0715   CO2 34 (H) 08/19/2019 0715   GLUCOSE 102 (H) 08/19/2019  0715   GLUCOSE 99 05/22/2005 0000   BUN 33 (H) 08/19/2019 0715   BUN 14 06/27/2015 1627   CREATININE 1.36 (H) 08/19/2019 0715   CREATININE 0.93 03/07/2014 1431   CALCIUM 9.3 08/19/2019 0715   PROT 6.2 (L) 08/18/2019 0231   PROT 6.7 04/01/2015 1423   ALBUMIN 3.0 (L) 08/18/2019 0231   ALBUMIN 4.2 04/01/2015 1423   AST 22 08/18/2019 0231   ALT 29 08/18/2019 0231   ALKPHOS 52 08/18/2019 0231   BILITOT 0.8 08/18/2019 0231   BILITOT 0.2 04/01/2015 1423   GFRNONAA 51 (L) 08/19/2019 0715   GFRNONAA 83 03/07/2014 1431   GFRAA 59 (L) 08/19/2019 0715   GFRAA >89 03/07/2014 1431    GFR Estimated Creatinine Clearance: 55.3 mL/min (A) (by C-G formula based on SCr of 1.36 mg/dL (H)). No results for input(s): LIPASE, AMYLASE in the last 72 hours. No results for input(s): CKTOTAL, CKMB, CKMBINDEX, TROPONINI in the last 72 hours. Invalid input(s): POCBNP Recent Labs    08/17/19 0253  DDIMER 0.55*   No results for input(s): HGBA1C in the last 72 hours. No results for input(s): CHOL, HDL, LDLCALC, TRIG, CHOLHDL, LDLDIRECT in the last 72 hours. No results for input(s): TSH, T4TOTAL, T3FREE, THYROIDAB in the last 72 hours.  Invalid input(s): FREET3 No results for input(s): VITAMINB12, FOLATE, FERRITIN, TIBC, IRON, RETICCTPCT in the last 72 hours. Coags: No results for input(s): INR in the last 72 hours.  Invalid input(s): PT Microbiology: Recent Results (from the past 240 hour(s))  Blood Culture (routine x 2)     Status: None   Collection Time: 08/12/19  4:56 PM   Specimen: BLOOD  Result Value Ref Range Status   Specimen Description BLOOD SITE NOT SPECIFIED  Final   Special Requests   Final    BOTTLES  DRAWN AEROBIC AND ANAEROBIC Blood Culture adequate volume   Culture   Final    NO GROWTH 5 DAYS Performed at [facility], [place] [address] [place], [place] [address]    Report Status 08/17/2019 FINAL  Final  Blood Culture (routine x 2)     Status: None   Collection Time:  08/12/19  5:02 PM   Specimen: BLOOD  Result Value Ref Range Status   Specimen Description BLOOD LEFT ANTECUBITAL  Final   Special Requests   Final    BOTTLES DRAWN AEROBIC AND ANAEROBIC Blood Culture adequate volume   Culture   Final    NO GROWTH 5 DAYS Performed at [facility], [place] [address], [place], [place] [address]    Report Status 08/17/2019 FINAL  Final    FURTHER DISCHARGE INSTRUCTIONS:  Get Medicines reviewed and adjusted: Please take all your medications with you for your next visit with your Primary MD  Laboratory/radiological data: Please request your Primary MD to go over all hospital tests and procedure/radiological results at the follow up, please ask your Primary MD to get all Hospital records sent to his/her office.  In some cases, they will be blood work, cultures and biopsy results pending at the time of your discharge. Please request that your primary care M.D. goes through all the records of your hospital data and follows up on these results.  Also Note the following: If you experience worsening of your admission symptoms, develop shortness of breath, life threatening emergency, suicidal or homicidal thoughts you must seek medical attention immediately by calling 911 or calling your MD immediately  if symptoms less severe.  You must read complete instructions/literature along with all the possible adverse reactions/side effects for all the Medicines you take and that have been prescribed to you. Take any new Medicines after you have completely understood and accpet all the possible adverse reactions/side effects.   Do not drive when taking Pain medications or sleeping medications (Benzodaizepines)  Do not take more than prescribed Pain, Sleep and Anxiety Medications. It is not advisable to combine anxiety,sleep and pain medications without talking with your primary care practitioner  Special Instructions: If you have smoked or chewed Tobacco  in the last 2  yrs please stop smoking, stop any regular Alcohol  and or any Recreational drug use.  Wear Seat belts while driving.  Please note: You were cared for by a hospitalist during your hospital stay. Once you are discharged, your primary care physician will handle any further medical issues. Please note that NO REFILLS for any discharge medications will be authorized once you are discharged, as it is imperative that you return to your primary care physician (or establish a relationship with a primary care physician if you do not have one) for your post hospital discharge needs so that they can reassess your need for medications and monitor your lab values.  Total Time spent coordinating discharge including counseling, education and face to face time equals 35 minutes.  SignedOren Binet 08/19/2019 9:57 AM

## 2019-08-22 ENCOUNTER — Ambulatory Visit (INDEPENDENT_AMBULATORY_CARE_PROVIDER_SITE_OTHER): Payer: No Typology Code available for payment source

## 2019-08-22 DIAGNOSIS — I493 Ventricular premature depolarization: Secondary | ICD-10-CM | POA: Diagnosis not present

## 2019-08-22 DIAGNOSIS — R002 Palpitations: Secondary | ICD-10-CM

## 2019-08-25 ENCOUNTER — Telehealth: Payer: Self-pay | Admitting: Internal Medicine

## 2019-08-25 NOTE — Telephone Encounter (Signed)
Spoke with pt's daughter and advised per Oda Kilts, PA pt should stop Entresto d/t possible reaction of lip swelling. HF clinic may consider Losartan at 05/05 visit.  Continue Carvedilol and spironolactone for now.  Take next dose of Carvedilol tonight and Spironolactone tomorrow.  If pt develops any SOB or difficulty swallowing 911 should be called immediately.    Discontinued pt's Entresto RX and added to Allergy list.  Pt's daughter verbalizes understanding and agrees with current plan.

## 2019-08-25 NOTE — Telephone Encounter (Signed)
Pt c/o medication issue:  1. Name of Medication: carvedilol (COREG) 12.5 MG tablet spironolactone (ALDACTONE) 25 MG tablet  2. How are you currently taking this medication (dosage and times per day)? Carvedilol 1 tablet by mouth two times daily. Spironolactone 0.5 a tablet daily.  3. Are you having a reaction (difficulty breathing--STAT)? Yes  4. What is your medication issue? Ricky Hayden is calling stating while Ricky Hayden was in the hospital they changed how he takes his carvedilol and added spironolactone. Since leaving the hospital with these medication changes Ricky Hayden's BP has been running low. Yesterday when the home nurse came it was 99/77 and then 83/66 when checked again. The nurse advised Ricky Hayden if she felt his BP was still running to low before bed not to give him his second dose of carvedilol. Ricky Hayden did not take the second dose last night and his BP this morning is 120/61 and then when checked on the other arm it is 101/71. Ricky Hayden would like clarification that she should give him his morning dose of both of these medications before giving it to him. Please advise.

## 2019-08-25 NOTE — Telephone Encounter (Signed)
Attempted phone call.  Left voicemail message to contact RN at [phone].

## 2019-08-25 NOTE — Telephone Encounter (Signed)
  If patient is having new facial swelling on recently started [facility], it should be stopped and added to his allergies as a reaction. Can consider starting losartan at HF visit 5/5.  He should continue coreg and spironolactone for now. Would take next dose of coreg tonight (not this am), and restart spiro tomorrow.   If pt develops any new SOB or difficulty swallowing, he should call 911 immediately.  Legrand Como [address] [place], PA-C  08/25/2019 10:04 AM

## 2019-08-25 NOTE — Telephone Encounter (Signed)
Spoke with pt who gives RN permission to speak with his daughter [name].  She states pt was recently discharged from the hospital with after diagnosis of Covid pneumonia.  Pt's daughter has concerns with pt's B/P as it was checked yesterday by home health staff with readings of 99/77 and 83/66 with no complaints of dizziness. Daughter states meds changed in the hospital are Carvedilol changed to 12.5mg  bid where he was taking 25mg  daily, Spironolactone was added 12.5mg  daily and Entresto 24-26mg  bid.  Daughter states she held pt's dose of Carvedilol last night and B/P this am is 120/61.  She also reports pt's bottom lip looks swollen but denies any trouble swallowing or new SOB, rash or itching.  Reports pt's wt is up 1 pound this am from yesterday.  Reports bilateral edema of the feet.  Daughter has held the Carvedilol  and the Spironolactone this am d/t B/P concerns.  Pt has taken Entresto this am.  Daughter is asking for further direction with meds.  Will forward to PA for review and recommendation.  Advised pt to ED for difficulty swallowing, breathing, swelling of face, mouth or tongue, dizziness, fainting ot CP.  Daughter verbalizes understanding and agrees with current plan.

## 2019-08-28 ENCOUNTER — Other Ambulatory Visit: Payer: Self-pay

## 2019-08-28 ENCOUNTER — Emergency Department (HOSPITAL_COMMUNITY)
Admission: EM | Admit: 2019-08-28 | Discharge: 2019-08-28 | Disposition: A | Discharge status: Home / Self Care | Payer: No Typology Code available for payment source | Attending: Emergency Medicine | Admitting: Emergency Medicine

## 2019-08-28 ENCOUNTER — Emergency Department (HOSPITAL_COMMUNITY): Payer: No Typology Code available for payment source

## 2019-08-28 DIAGNOSIS — I5042 Chronic combined systolic (congestive) and diastolic (congestive) heart failure: Secondary | ICD-10-CM | POA: Insufficient documentation

## 2019-08-28 DIAGNOSIS — Y9389 Activity, other specified: Secondary | ICD-10-CM | POA: Diagnosis not present

## 2019-08-28 DIAGNOSIS — S7002XA Contusion of left hip, initial encounter: Secondary | ICD-10-CM | POA: Insufficient documentation

## 2019-08-28 DIAGNOSIS — Z87891 Personal history of nicotine dependence: Secondary | ICD-10-CM | POA: Diagnosis not present

## 2019-08-28 DIAGNOSIS — Y92002 Bathroom of unspecified non-institutional (private) residence single-family (private) house as the place of occurrence of the external cause: Secondary | ICD-10-CM | POA: Diagnosis not present

## 2019-08-28 DIAGNOSIS — W19XXXA Unspecified fall, initial encounter: Secondary | ICD-10-CM

## 2019-08-28 DIAGNOSIS — W1839XA Other fall on same level, initial encounter: Secondary | ICD-10-CM | POA: Diagnosis not present

## 2019-08-28 DIAGNOSIS — Z79899 Other long term (current) drug therapy: Secondary | ICD-10-CM | POA: Insufficient documentation

## 2019-08-28 DIAGNOSIS — N183 Chronic kidney disease, stage 3 unspecified: Secondary | ICD-10-CM | POA: Diagnosis not present

## 2019-08-28 DIAGNOSIS — S0990XA Unspecified injury of head, initial encounter: Secondary | ICD-10-CM | POA: Insufficient documentation

## 2019-08-28 DIAGNOSIS — Y999 Unspecified external cause status: Secondary | ICD-10-CM | POA: Insufficient documentation

## 2019-08-28 DIAGNOSIS — I130 Hypertensive heart and chronic kidney disease with heart failure and stage 1 through stage 4 chronic kidney disease, or unspecified chronic kidney disease: Secondary | ICD-10-CM | POA: Diagnosis not present

## 2019-08-28 DIAGNOSIS — I951 Orthostatic hypotension: Secondary | ICD-10-CM

## 2019-08-28 LAB — URINALYSIS, ROUTINE W REFLEX MICROSCOPIC
Bilirubin Urine: NEGATIVE
Glucose, UA: NEGATIVE mg/dL
Hgb urine dipstick: NEGATIVE
Ketones, ur: NEGATIVE mg/dL
Leukocytes,Ua: NEGATIVE
Nitrite: NEGATIVE
Protein, ur: NEGATIVE mg/dL
Specific Gravity, Urine: 1.009 (ref 1.005–1.030)
pH: 6 (ref 5.0–8.0)

## 2019-08-28 LAB — BASIC METABOLIC PANEL
Anion gap: 8 (ref 5–15)
BUN: 12 mg/dL (ref 8–23)
CO2: 33 mmol/L — ABNORMAL HIGH (ref 22–32)
Calcium: 9.3 mg/dL (ref 8.9–10.3)
Chloride: 99 mmol/L (ref 98–111)
Creatinine, Ser: 1.26 mg/dL — ABNORMAL HIGH (ref 0.61–1.24)
GFR calc Af Amer: >60 mL/min (ref >60)
GFR calc non Af Amer: 55 mL/min — ABNORMAL LOW (ref >60)
Glucose, Bld: 147 mg/dL — ABNORMAL HIGH (ref 70–99)
Potassium: 4.3 mmol/L (ref 3.5–5.1)
Sodium: 140 mmol/L (ref 135–145)

## 2019-08-28 LAB — CBC
HCT: 45.8 % (ref 39.0–52.0)
Hemoglobin: 14.4 g/dL (ref 13.0–17.0)
MCH: 29.9 pg (ref 26.0–34.0)
MCHC: 31.4 g/dL (ref 30.0–36.0)
MCV: 95 fL (ref 80.0–100.0)
Platelets: 149 10*3/uL — ABNORMAL LOW (ref 150–400)
RBC: 4.82 MIL/uL (ref 4.22–5.81)
RDW: 13.2 % (ref 11.5–15.5)
WBC: 5.2 10*3/uL (ref 4.0–10.5)
nRBC: 0 % (ref 0.0–0.2)

## 2019-08-28 LAB — BRAIN NATRIURETIC PEPTIDE: B Natriuretic Peptide: 513.7 pg/mL — ABNORMAL HIGH (ref 0.0–100.0)

## 2019-08-28 MED ORDER — SODIUM CHLORIDE 0.9% FLUSH: 3.0000 mL | Freq: Once | INTRAVENOUS | Status: DC

## 2019-08-28 NOTE — ED Provider Notes (Signed)
[facility] EMERGENCY DEPARTMENT Provider Note   CSN: [id number] Arrival date & time: 08/28/19  0932     History Chief Complaint  Patient presents with  . Fall  . Congestive Heart Failure    Ricky Hayden is a 74 y.o. male.  HPI Patient was discharged on the hospital [phone].  Patient had Covid pneumonia and heart failure exacerbation.  Last night he got up to go to the bathroom pretty quickly at about 5 AM and was standing in front of the toilet urinating when he became very lightheaded.  He reports he could see the wall and the floor coming towards him and he was trying to break his fall.  He reports he could not vent himself from falling and temporarily losing consciousness.  He came around right away.  He reports he did have quite a bit of pain in the left hip from the fall.  He has been able to weight-bear and ambulate but has had pain in the hip.  No weakness or numbness to the extremities.  He also has some pain over his right brow where he struck his head.  No chest pain, no increased shortness of breath.  Patient's daughter reports he has increased his weight by 4 pounds since her discharge from the hospital.  She is concerned about possible increasing fluid overload.  Patient is wearing compression hose.  Swelling of the feet and legs is improved compared to before diuresis and wearing compression hose.  He has been having problems with low blood pressure and his providers have decreased his pressure medications.  Ricky Hayden was discontinued less than 48 hours ago.  His carvedilol also was decreased from 25 mg twice a day to 12.5 mg twice a day.  Patient is taking torsemide twice a day and Demadex once in the morning.    Past Medical History:  Diagnosis Date  . Arthritis    osteoarthritis of left knee  . Ascending aorta dilatation (HCC)   . Balanitis    recurrent  . Cardiac arrhythmia    life threatening, secondary to CCB vs b- blockers  . Cardiomyopathy  ([place])   . Chronic joint pain   . Chronic systolic CHF (congestive heart failure) ([place])   . CKD (chronic kidney disease), stage III   . COPD (chronic obstructive pulmonary disease) ([place])   . Coronary artery disease   . Dilated aortic root ([place])   . Enlarged prostate   . 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 GERD (gastroesophageal reflux disease)   . Gout   . Hiatal hernia   . Hyperlipidemia   . Hypertension   . Intracranial hematoma ([place]) 1995   history of, s/p evacuation by Dr. Sherwood Gambler  . Nocturia   . Obesity   . Pansinusitis    a.  complicated by brain abscess and bleeding requiring craniotomy in 1995.  Marland Kitchen PONV (postoperative nausea and vomiting)   . Sinusitis    s/p ethmoidectomy and nasal septoplasty  . Vertigo    intermitantly    Patient Active Problem List   Diagnosis Date Noted  . Acute hypoxemic respiratory failure due to COVID-19 ([place]) 08/12/2019  . CKD (chronic kidney disease) stage 3, GFR 30-59 ml/min 08/12/2019  . COPD with acute exacerbation ([place]) 08/12/2019  . Hyperkalemia   . Acute renal injury ([place]) 10/21/2018  . Respiratory tract congestion with cough 06/18/2015  . Chronic combined systolic and diastolic CHF, NYHA class 2 ([place]) 06/10/2015  . Leg  swelling 07/16/2014  . Preventative health care 03/07/2014  . COPD (chronic obstructive pulmonary disease) ([place]) 09/11/2013  . BPPV (benign paroxysmal positional vertigo) 10/05/2012  . BPH (benign prostatic hyperplasia) 01/10/2008  . Gout 04/22/2006  . ERECTILE DYSFUNCTION 04/22/2006  . 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 Hyperlipidemia 03/15/2006  . Essential hypertension 03/15/2006  . GERD 03/15/2006  . HIATAL HERNIA 03/15/2006  . OSTEOARTHRITIS 03/15/2006  . 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 CIRCUMCISION N/A 11/20/2013   Procedure: CIRCUMCISION ADULT;  Surgeon: Claybon Jabs, MD;  Location: [facility];  Service: Urology;   Laterality: N/A;  . [place]   hematomy due to sinus infection   . shoulder surg rt   1995  . SINUS SURGERY WITH INSTATRAK     ethmoidectomy and nasal septum repair       Family History  Problem Relation Age of Onset  . Heart failure Mother 31  . Heart attack Father 60    Social History   Tobacco Use  . Smoking status: Former Smoker    Quit date: 05/04/1986    Years since quitting: 33.3  . Smokeless tobacco: Never Used  Substance Use Topics  . Alcohol use: No    Alcohol/week: 0.0 standard drinks  . Drug use: No    Home Medications Prior to Admission medications   Medication Sig Start Date End Date Taking? Authorizing Provider  albuterol (PROVENTIL HFA;VENTOLIN HFA) 108 (90 BASE) MCG/ACT inhaler Inhale 2 puffs into the lungs every 6 (six) hours as needed for wheezing. 12/10/14  Yes Maryellen Pile, MD  allopurinol (ZYLOPRIM) 100 MG tablet Take 2 tablets (200 mg total) by mouth daily. 12/10/14 08/28/19 Yes Maryellen Pile, MD  budesonide-formoterol (SYMBICORT) 80-4.5 MCG/ACT inhaler Inhale 2 puffs into the lungs 2 (two) times daily. 09/17/15  Yes Corky Sox, MD  carvedilol (COREG) 12.5 MG tablet Take 1 tablet (12.5 mg total) by mouth 2 (two) times daily with a meal. 08/19/19  Yes Ghimire, Henreitta Leber, MD  diclofenac Sodium (VOLTAREN) 1 % GEL Apply 2 g topically daily as needed (pain).   Yes [provider]  docusate sodium (COLACE) 100 MG capsule Take 1-2 capsules (100-200 mg total) by mouth 2 (two) times daily as needed for mild constipation. 03/05/14  Yes Kelby Aline, MD  finasteride (PROSCAR) 5 MG tablet TAKE ONE TABLET BY MOUTH ONCE DAILY Patient taking differently: Take 5 mg by mouth daily.  03/05/15  Yes Maryellen Pile, MD  omeprazole (PRILOSEC) 20 MG capsule Take 1 capsule (20 mg total) by mouth daily. 12/10/14  Yes Maryellen Pile, MD  PHENobarbital (LUMINAL) 64.8 MG tablet Take 1 tablet (64.8 mg total) by mouth 2 (two) times daily. 07/24/15  Yes Corky Sox,  MD  polyethylene glycol [facility] Floria Raveling) packet Take 17 g by mouth daily as needed for moderate constipation. 12/10/14  Yes Maryellen Pile, MD  pravastatin (PRAVACHOL) 40 MG tablet Take 1 tablet (40 mg total) by mouth every evening. 12/10/14  Yes Maryellen Pile, MD  SPIRIVA HANDIHALER 18 MCG inhalation capsule INHALE ONE DOSE BY MOUTH ONCE DAILY Patient taking differently: Place 18 mcg into inhaler and inhale daily.  09/27/15  Yes Maryellen Pile, MD  spironolactone (ALDACTONE) 25 MG tablet Take 0.5 tablets (12.5 mg total) by mouth daily. 08/19/19  Yes Ghimire, Henreitta Leber, MD  tamsulosin (FLOMAX) 0.4 MG CAPS capsule Take 1 capsule (0.4 mg total) by mouth daily. 03/05/15  Yes Maryellen Pile,  MD  torsemide (DEMADEX) 20 MG tablet Take 1 tablet (20 mg total) by mouth 2 (two) times daily. 08/19/19  Yes Ghimire, Henreitta Leber, MD    Allergies    Calcium channel blockers and Entresto [sacubitril-valsartan]  Review of Systems   Review of Systems 10 Systems reviewed and are negative for acute change except as noted in the HPI.  Physical Exam Updated Vital Signs BP 123/87   Pulse 89   Temp 98 F (36.7 C) (Oral)   Resp (!) 22   SpO2 100%   Physical Exam Constitutional:      Comments: Alert nontoxic.  No respiratory distress at rest.  HENT:     Head:     Comments: Patient has very minor lack to the right brow.  No gaping and no active bleeding.  No significant hematoma.    Mouth/Throat:     Mouth: Mucous membranes are moist.     Pharynx: Oropharynx is clear.  Eyes:     Extraocular Movements: Extraocular movements intact.     Pupils: Pupils are equal, round, and reactive to light.  Cardiovascular:     Rate and Rhythm: Normal rate and regular rhythm.     Heart sounds: Normal heart sounds.  Pulmonary:     Comments: No respiratory distress.  Patient does have occasional expiratory wheeze at the base and midlung fields.  No crackle. Abdominal:     General: There is no distension.      Palpations: Abdomen is soft.     Tenderness: There is no abdominal tenderness. There is no guarding.     Comments: Patient does have central obesity but does not have any abdominal wall edema.  Multiple old ecchymoses from subcutaneous abdominal wall injections on the lower abdomen.  Musculoskeletal:     Cervical back: Neck supple.     Comments: 1+ to 2+ pitting edema symmetric bilaterally.  Calf soft and nontender.  Skin:    General: Skin is warm and dry.  Neurological:     General: No focal deficit present.     Mental Status: He is oriented to person, place, and time.     Cranial Nerves: No cranial nerve deficit.     Coordination: Coordination normal.  Psychiatric:        Mood and Affect: Mood normal.     ED Results / Procedures / Treatments   Labs (all labs ordered are listed, but only abnormal results are displayed) Labs Reviewed  BASIC METABOLIC PANEL - Abnormal; Notable for the following components:      Result Value   CO2 33 (*)    Glucose, Bld 147 (*)    Creatinine, Ser 1.26 (*)    GFR calc non Af Amer 55 (*)    All other components within normal limits  CBC - Abnormal; Notable for the following components:   Platelets 149 (*)    All other components within normal limits  BRAIN NATRIURETIC PEPTIDE - Abnormal; Notable for the following components:   B Natriuretic Peptide 513.7 (*)    All other components within normal limits  URINALYSIS, ROUTINE W REFLEX MICROSCOPIC    EKG EKG Interpretation  Date/Time:  Monday August 28 2019 09:48:35 EDT Ventricular Rate:  80 PR Interval:  192 QRS Duration: 164 QT Interval:  452 QTC Calculation: 521 R Axis:   -58 Text Interpretation: Sinus rhythm with Premature atrial complexes Possible Left atrial enlargement Left axis deviation Left bundle branch block No significant change since last tracing Confirmed by Blanchie Dessert (  [id number]) on 08/28/2019 3:24:26 PM   Radiology DG Chest 2 View  Result Date: 08/28/2019 CLINICAL DATA:   CHF EXAM: CHEST - 2 VIEW COMPARISON:  08/17/2019 FINDINGS: Cardiac enlargement without heart failure. No edema or effusion. No focal infiltrate. IMPRESSION: Cardiac enlargement without acute abnormality. Electronically Signed   By: Franchot Gallo M.D.   On: 08/28/2019 11:05   DG Hip Unilat W or Wo Pelvis 2-3 Views Left  Result Date: 08/28/2019 CLINICAL DATA:  Fall.  Left hip pain EXAM: DG HIP (WITH OR WITHOUT PELVIS) 2-3V LEFT COMPARISON:  None. FINDINGS: Negative for left hip fracture. Degenerative change in spurring in the acetabulum. Negative pelvis.  No focal bony lesion. IMPRESSION: No acute abnormality. Electronically Signed   By: Franchot Gallo M.D.   On: 08/28/2019 11:05    Procedures Procedures (including critical care time)  Medications Ordered in ED Medications  sodium chloride flush (NS) 0.9 % injection 3 mL (has no administration in time range)    ED Course  I have reviewed the triage vital signs and the nursing notes.  Pertinent labs & imaging results that were available during my care of the patient were reviewed by me and considered in my medical decision making (see chart for details).    MDM Rules/Calculators/A&P                     Consult: Reviewed with Dr. Sung Amabile.  Reviewed attentional for Lasix home management trial.  At this time, given that patient is mildly orthostatic, will anticipate holding diuretics this evening.  Dr. Ronna Polio will make Dr. Davina Poke aware of the patient and attempt to get patient follow-up in the office this week.  Patient presents aligned above.  CT head negative for any acute intracranial findings.  Patient has no neurologic complaints.  Minor head injury precautions provided. Patient has had congestive heart failure.  He is taking Demadex and torsemide.  He does not have volume overload on auscultation or chest x-ray.  At this time we will have him hold tonight's torsemide dose due to persisting mild orthostatic hypotension.  Also half of  the carvedilol dose tonight.  Return precautions reviewed. Final Clinical Impression(s) / ED Diagnoses Final diagnoses:  Fall, initial encounter  Orthostatic hypotension  Minor head injury, initial encounter  Contusion of left hip, initial encounter    Rx / DC Orders ED Discharge Orders    None       Charlesetta Shanks, MD 08/28/19 1755

## 2019-08-28 NOTE — ED Triage Notes (Signed)
Pt here from home, hx of CHF and per daughter has gained 3 lbs overnight and 5 lbs since Saturday. Denies shortness of breath or chest pain. Pt fell this morning at 5am, was standing using the bathroom, became dizzy, and fell over, hitting his L hip and R forehead. Not on a blood thinner. No LOC. Able to ambulate after someone helped him up. EMS called at that time, pt refused transport. Wears 2L O2 at home.

## 2019-08-28 NOTE — ED Notes (Signed)
Pt discharge instructions reviewed with the patient. The patient verbalized understanding of instructions. Pt discharged. 

## 2019-08-28 NOTE — Progress Notes (Signed)
   08/28/19 1352  TOC ED Mini Assessment  TOC Time spent with patient (minutes): 15  TOC Time saved using PING (minutes): 15  PING Used in TOC Assessment Yes  What brought you to the Emergency Department?  fluid build up  Barriers to Discharge Continued Medical Work up  [employer] worker is [employer] [phone] ex. [phone]). Patient is not service connected and therefore would not qualify for long term care through the [place]

## 2019-08-28 NOTE — Discharge Instructions (Addendum)
1.  Take half of your carvedilol dose tonight, 6.25 mg. 2.  Do not take your evening torsemide dose. 3.  Put a bedside commode in your room.  Use your walker at all times.  Before you stand up from a lying or seated position, stand in one spot for at least 30 seconds before you start walking.  If you feel lightheaded or dizzy, sit back down immediately.  Try to have an assistant with you at all times when you are walking or changing positions. 4.  Monitor your blood pressures every 6-8 hours over the next several days and anytime you feel lightheaded.  Keep a log of your blood pressures. 5.  Call Dr. Haroldine Laws first thing in the morning to discuss ongoing management of your diuretics and blood pressure medications given your low blood pressures. 6.  Return to the emergency department immediately if you have worsening or changing symptoms. 7.  You have some wheezing on physical exam, use your albuterol inhaler every 4-6 hours for the next 1 to 2 days.

## 2019-08-29 ENCOUNTER — Telehealth (HOSPITAL_COMMUNITY): Payer: Self-pay | Admitting: *Deleted

## 2019-08-29 NOTE — Telephone Encounter (Signed)
pts daughter [name] left VM stating pt was seen in the ED last night and some medications were held she needed to know which medications to give to pt today I called [name] back no answer/left VM requesting she return my call.

## 2019-08-31 ENCOUNTER — Telehealth (HOSPITAL_COMMUNITY): Payer: Self-pay | Admitting: *Deleted

## 2019-08-31 NOTE — Telephone Encounter (Signed)
Routed to [employer] also.

## 2019-08-31 NOTE — Telephone Encounter (Signed)
  Pts daughter called to report pt was seen in the ED for a fall due to him on 4/26. His discharge instructions from ED were "At this time we will have him hold tonight's torsemide dose due to persisting mild orthostatic hypotension.  Also half of the carvedilol dose tonight." Pt was to call our office for advice on how to resume medication.  Pts bp has been holding around 120/80 gbut his weight is up 3lbs over night. Pt has not resumed torsemide or full dose of carvedilol.    Route to [place] for advice

## 2019-09-01 NOTE — Telephone Encounter (Signed)
Left VM requesting pts daughter [name] return my call. Called pts phone went directly to VM unable to leave VM.

## 2019-09-01 NOTE — Telephone Encounter (Signed)
He was referred by Dr.Klein he has an appt with you on 5/5

## 2019-09-01 NOTE — Telephone Encounter (Signed)
I dont think I have ever seen the patient so hard to know.   I would stop carvedilol. Use torsemide every other day for now.   What does his cardiology f/u look like?

## 2019-09-05 ENCOUNTER — Telehealth (HOSPITAL_COMMUNITY): Payer: Self-pay | Admitting: Vascular Surgery

## 2019-09-05 NOTE — Telephone Encounter (Signed)
2ND attempt to contact pt. Left pt VM , 5/5 appt will be moved from 11am to 12:30PM, ASKED PT to [place] ,

## 2019-09-06 ENCOUNTER — Ambulatory Visit (HOSPITAL_COMMUNITY)
Admission: RE | Admit: 2019-09-06 | Discharge: 2019-09-06 | Disposition: A | Discharge status: Home / Self Care | Payer: No Typology Code available for payment source | Source: Ambulatory Visit | Attending: Internal Medicine | Admitting: Internal Medicine

## 2019-09-06 ENCOUNTER — Other Ambulatory Visit: Payer: Self-pay

## 2019-09-06 ENCOUNTER — Encounter (HOSPITAL_COMMUNITY): Payer: Self-pay | Admitting: Internal Medicine

## 2019-09-06 VITALS — BP 122/86 | HR 103 | Ht 66.0 in | Wt 238.0 lb

## 2019-09-06 DIAGNOSIS — I130 Hypertensive heart and chronic kidney disease with heart failure and stage 1 through stage 4 chronic kidney disease, or unspecified chronic kidney disease: Secondary | ICD-10-CM | POA: Diagnosis not present

## 2019-09-06 DIAGNOSIS — Z8661 Personal history of infections of the central nervous system: Secondary | ICD-10-CM | POA: Insufficient documentation

## 2019-09-06 DIAGNOSIS — R0683 Snoring: Secondary | ICD-10-CM

## 2019-09-06 DIAGNOSIS — E785 Hyperlipidemia, unspecified: Secondary | ICD-10-CM | POA: Diagnosis not present

## 2019-09-06 DIAGNOSIS — K219 Gastro-esophageal reflux disease without esophagitis: Secondary | ICD-10-CM | POA: Insufficient documentation

## 2019-09-06 DIAGNOSIS — I5042 Chronic combined systolic (congestive) and diastolic (congestive) heart failure: Secondary | ICD-10-CM

## 2019-09-06 DIAGNOSIS — Z8616 Personal history of COVID-19: Secondary | ICD-10-CM | POA: Diagnosis not present

## 2019-09-06 DIAGNOSIS — Z7951 Long term (current) use of inhaled steroids: Secondary | ICD-10-CM | POA: Insufficient documentation

## 2019-09-06 DIAGNOSIS — J449 Chronic obstructive pulmonary disease, unspecified: Secondary | ICD-10-CM | POA: Diagnosis not present

## 2019-09-06 DIAGNOSIS — I447 Left bundle-branch block, unspecified: Secondary | ICD-10-CM | POA: Diagnosis not present

## 2019-09-06 DIAGNOSIS — J9611 Chronic respiratory failure with hypoxia: Secondary | ICD-10-CM | POA: Diagnosis not present

## 2019-09-06 DIAGNOSIS — Z87891 Personal history of nicotine dependence: Secondary | ICD-10-CM | POA: Diagnosis not present

## 2019-09-06 DIAGNOSIS — Z8249 Family history of ischemic heart disease and other diseases of the circulatory system: Secondary | ICD-10-CM | POA: Diagnosis not present

## 2019-09-06 DIAGNOSIS — N183 Chronic kidney disease, stage 3 unspecified: Secondary | ICD-10-CM | POA: Insufficient documentation

## 2019-09-06 DIAGNOSIS — M109 Gout, unspecified: Secondary | ICD-10-CM | POA: Diagnosis not present

## 2019-09-06 DIAGNOSIS — I5022 Chronic systolic (congestive) heart failure: Secondary | ICD-10-CM | POA: Insufficient documentation

## 2019-09-06 DIAGNOSIS — I2510 Atherosclerotic heart disease of native coronary artery without angina pectoris: Secondary | ICD-10-CM | POA: Diagnosis not present

## 2019-09-06 DIAGNOSIS — Z79899 Other long term (current) drug therapy: Secondary | ICD-10-CM | POA: Insufficient documentation

## 2019-09-06 LAB — COMPREHENSIVE METABOLIC PANEL WITH GFR
ALT: 19 U/L (ref 0–44)
AST: 22 U/L (ref 15–41)
Albumin: 3.2 g/dL — ABNORMAL LOW (ref 3.5–5.0)
Alkaline Phosphatase: 61 U/L (ref 38–126)
Anion gap: 9 (ref 5–15)
BUN: 17 mg/dL (ref 8–23)
CO2: 33 mmol/L — ABNORMAL HIGH (ref 22–32)
Calcium: 9.6 mg/dL (ref 8.9–10.3)
Chloride: 99 mmol/L (ref 98–111)
Creatinine, Ser: 1.25 mg/dL — ABNORMAL HIGH (ref 0.61–1.24)
GFR calc Af Amer: >60 mL/min
GFR calc non Af Amer: 56 mL/min — ABNORMAL LOW
Glucose, Bld: 99 mg/dL (ref 70–99)
Potassium: 5.1 mmol/L (ref 3.5–5.1)
Sodium: 141 mmol/L (ref 135–145)
Total Bilirubin: 0.8 mg/dL (ref 0.3–1.2)
Total Protein: 6.8 g/dL (ref 6.5–8.1)

## 2019-09-06 LAB — CBC
HCT: 44.4 % (ref 39.0–52.0)
Hemoglobin: 14 g/dL (ref 13.0–17.0)
MCH: 30 pg (ref 26.0–34.0)
MCHC: 31.5 g/dL (ref 30.0–36.0)
MCV: 95.1 fL (ref 80.0–100.0)
Platelets: 174 K/uL (ref 150–400)
RBC: 4.67 MIL/uL (ref 4.22–5.81)
RDW: 13.5 % (ref 11.5–15.5)
WBC: 4.9 K/uL (ref 4.0–10.5)
nRBC: 0 % (ref 0.0–0.2)

## 2019-09-06 LAB — BRAIN NATRIURETIC PEPTIDE: B Natriuretic Peptide: 848.3 pg/mL — ABNORMAL HIGH (ref 0.0–100.0)

## 2019-09-06 MED ORDER — ENTRESTO 24-26 MG PO TABS: 1.0000 | ORAL_TABLET | Freq: Two times a day (BID) | ORAL | 3 refills | Status: DC

## 2019-09-06 MED ORDER — TORSEMIDE 20 MG PO TABS: 20.0000 mg | ORAL_TABLET | Freq: Two times a day (BID) | ORAL | 2 refills | Status: DC

## 2019-09-06 MED ORDER — CARVEDILOL 6.25 MG PO TABS: 6.2500 mg | ORAL_TABLET | Freq: Two times a day (BID) | ORAL | 3 refills | Status: DC

## 2019-09-06 NOTE — Progress Notes (Signed)
Referring: Dr. Sloan Leiter PCP: [place] Cardiologist: None   HPI:  Maxtyn Nuzum is a 74 y.o. 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 Caryl Comes for further evaluation of his HF.   He has been followed at the [facility]. Referred to Dr. Caryl Comes in 4/21 for consideration of CRT-D.   He was seen by Dr. Caryl Comes with a televisit on 08/10/19. Based on his note he has had low EF off/on since 2016. In 2016 echo in our system showed EF 45-50%. Follow up echos at [facility]. He had an echo in 10/17 EF 30-35% Last echo 02/2019 was technically difficult, LVEF 30-35%, moderate global HK, diastolic function not well evaluated due to ectopy, mild LVH, moderate LAE, mild aortic regurgitation, mild-moderate mitral regurgitation, mild tricuspid regurgitation, mild aortic root dilatation (4.2cm). Losartanpreviously had to bestopped because of hyperkalemia.   Dr. Caryl Comes referred him to th HF program for further evaluation of possible underlying CA.   Unfortunately, he was admitted 4/10 to 08/09/19 with COVID PNA and HF. Echo repeated EF , 20 Severe RV dysfunction. Recovered and is here with his daughter for f/u. His wife died 2 weeks ago from [place].   On d/c from the hospital was sent home on carvedilol, Entresto, torsemide and spiro. There was a question of lower lip swelling and Entresto was stopped.   Had low BP and a fall and went back to the ER on 4/26. Several meds stopped. Had to restart torsemide due to 4.5pound weight gain.  Daughter has been following his closely and helping to adjust his meds. Now taking torsemide 20 bid, spiro 12.5 daily and carvedilol 12.5 bid.  He remembers undergoing a cath in 1995 through his groin and being told he had  something but it wasn't bad enough to do anything about. Was told that he possibly had a mild heart attack on the back side of his heart. He denies prior stenting/CABG or valve issues. His dad died of an MI at age 52.He drank significant ETOH in the past but quit 10 years ago. Quit smoking in 1984.  Says he feels fine. Gets around the house and yard with his walker. Wears O2. Has HHPT/OT/RN coming out. Denies CP. Dyspnea on mild exertion. Edema improved. Daughter says he snores.    Review of Systems:     Cardiac Review of Systems: {Y] = yes [ ]  = no  Chest Pain [    ]  Resting SOB [   ] Exertional SOB  [ y ]  Orthopnea [  ]   Pedal Edema [ y  ]    Palpitations [  ] Syncope  [  ]   Presyncope [   ]  General Review of Systems: [Y] = yes [  ]=no Constitional: recent weight change [ y ]; anorexia [  ]; fatigue [ y ]; nausea [  ]; night sweats [  ]; fever [  ]; or chills [  ];  Dental: poor dentition[  ];  Eye : blurred vision [  ]; diplopia [   ]; vision changes [  ];  Amaurosis fugax[  ]; Resp: cough [  ];  wheezing[  ];  hemoptysis[  ]; shortness of breath[ y ]; paroxysmal nocturnal dyspnea[  ]; dyspnea on exertion[ y ]; or orthopnea[  ];  GI:  gallstones[  ], vomiting[  ];  dysphagia[  ]; melena[  ];  hematochezia [  ]; heartburn[  ];   GU: kidney stones [  ]; hematuria[  ];   dysuria [  ];  nocturia[  ];  history of     obstruction [  ];                 Skin: rash, swelling[  ];, hair loss[  ];  peripheral edema[  ];  or itching[  ]; Musculosketetal: myalgias[  ];  joint swelling[  ];  joint erythema[  ];  joint pain[ y ];  back pain[  ];  Heme/Lymph: bruising[  ];  bleeding[  ];  anemia[  ];  Neuro: TIA[  ];  headaches[  ];  stroke[  ];  vertigo[  ];  seizures[  ];   paresthesias[  ];  difficulty walking[  ];  Psych:depression[  ]; anxiety[  ];  Endocrine:  diabetes[  ];  thyroid dysfunction[  ];     Past Medical History:  Diagnosis Date  . Arthritis    osteoarthritis of left knee  . Ascending aorta dilatation (HCC)   . Balanitis    recurrent  . Cardiac arrhythmia    life threatening, secondary to CCB vs b- blockers  . Cardiomyopathy ([place])   . Chronic joint pain   . Chronic systolic CHF (congestive heart failure) ([place])   . CKD (chronic kidney disease), stage III   . COPD (chronic obstructive pulmonary disease) ([place])   . Coronary artery disease   . Dilated aortic root ([place])   . Enlarged prostate   . 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 GERD (gastroesophageal reflux disease)   . Gout   . Hiatal hernia   . Hyperlipidemia   . Hypertension   . Intracranial hematoma ([place]) 1995   history of, s/p evacuation by Dr. Sherwood Gambler  . Nocturia   . Obesity   . Pansinusitis    a.  complicated by brain abscess and bleeding requiring craniotomy in 1995.  Marland Kitchen PONV (postoperative nausea and vomiting)   . Sinusitis    s/p ethmoidectomy and nasal septoplasty  . Vertigo    intermitantly    Current Outpatient Medications  Medication Sig Dispense Refill  . albuterol (PROVENTIL HFA;VENTOLIN HFA) 108 (90 BASE) MCG/ACT inhaler Inhale 2 puffs into the lungs every 6 (six) hours as needed for wheezing. 1 Inhaler 11  . allopurinol (ZYLOPRIM) 100 MG tablet Take 2 tablets (200 mg total) by mouth daily. 60 tablet 11  . budesonide-formoterol (SYMBICORT) 80-4.5 MCG/ACT inhaler Inhale 2 puffs into the lungs 2 (two) times daily. 1 Inhaler 12  . carvedilol (COREG) 12.5 MG tablet Take 1 tablet (12.5 mg total) by mouth 2 (two) times daily with a meal. 60 tablet 0  . diclofenac Sodium (VOLTAREN) 1 % GEL Apply 2 g topically daily as needed (pain).    Marland Kitchen docusate sodium (COLACE) 100 MG capsule Take 1-2 capsules (100-200 mg total) by mouth 2 (two) times daily as needed for mild constipation. 180 capsule 3  . finasteride (PROSCAR) 5 MG  tablet TAKE ONE  TABLET BY MOUTH ONCE DAILY 30 tablet 11  . omeprazole (PRILOSEC) 20 MG capsule Take 1 capsule (20 mg total) by mouth daily. 30 capsule 11  . PHENobarbital (LUMINAL) 64.8 MG tablet Take 1 tablet (64.8 mg total) by mouth 2 (two) times daily. 180 tablet 3  . polyethylene glycol (MIRALAX / GLYCOLAX) packet Take 17 g by mouth daily as needed for moderate constipation. 14 each 0  . pravastatin (PRAVACHOL) 40 MG tablet Take 1 tablet (40 mg total) by mouth every evening. 90 tablet 3  . SPIRIVA HANDIHALER 18 MCG inhalation capsule INHALE ONE DOSE BY MOUTH ONCE DAILY (Patient taking differently: Place 18 mcg into inhaler and inhale daily. ) 30 capsule 11  . spironolactone (ALDACTONE) 25 MG tablet Take 0.5 tablets (12.5 mg total) by mouth daily. 30 tablet 0  . tamsulosin (FLOMAX) 0.4 MG CAPS capsule Take 1 capsule (0.4 mg total) by mouth daily. 90 capsule 3  . torsemide (DEMADEX) 20 MG tablet Take 1 tablet (20 mg total) by mouth 2 (two) times daily. 30 tablet 0   No current facility-administered medications for this encounter.     Allergies  Allergen Reactions  . Calcium Channel Blockers Other (See Comments)    Came to hospital in 1995-caused chest pain   . Entresto [Sacubitril-Valsartan] Swelling    Possible lip swelling per pt's daughter    Social History   Socioeconomic History  . Marital status: Married    Spouse name: Not on file  . Number of children: Not on file  . Years of education: Not on file  . Highest education level: Not on file  Occupational History  . Not on file  Tobacco Use  . Smoking status: Former Smoker    Quit date: 05/04/1986    Years since quitting: 33.3  . Smokeless tobacco: Never Used  Substance and Sexual Activity  . Alcohol use: No    Alcohol/week: 0.0 standard drinks  . Drug use: No  . Sexual activity: Yes  Other Topics Concern  . Not on file  Social History Narrative  . Not on file   Social Determinants of Health   Financial Resource Strain:   .  Difficulty of Paying Living Expenses:   Food Insecurity:   . Worried About Charity fundraiser in the Last Year:   . Arboriculturist in the Last Year:   Transportation Needs:   . Film/video editor (Medical):   Marland Kitchen Lack of Transportation (Non-Medical):   Physical Activity:   . Days of Exercise per Week:   . Minutes of Exercise per Session:   Stress:   . Feeling of Stress :   Social Connections:   . Frequency of Communication with Friends and Family:   . Frequency of Social Gatherings with Friends and Family:   . Attends Religious Services:   . Active Member of Clubs or Organizations:   . Attends Archivist Meetings:   Marland Kitchen Marital Status:   Intimate Partner Violence:   . Fear of Current or Ex-Partner:   . Emotionally Abused:   Marland Kitchen Physically Abused:   . Sexually Abused:     Family History  Problem Relation Age of Onset  . Heart failure Mother 54  . Heart attack Father 79    PHYSICAL EXAM: Vitals:   09/06/19 1241  BP: 122/86  Pulse: (!) 103  SpO2: 98%   General:  Elderly obese male on O2 No respiratory difficulty HEENT: normal Neck: supple.  no JVD. Carotids 2+ bilat; no bruits. No lymphadenopathy or thryomegaly appreciated. Cor: PMI nondisplaced. Regular rate & rhythm. No rubs, gallops or murmurs. Lungs: clear with decreased BS Abdomen: soft, nontender, nondistended. No hepatosplenomegaly. No bruits or masses. Good bowel sounds. Extremities: no cyanosis, clubbing, rash, 1+ edema Neuro: alert & oriented x 3, cranial nerves grossly intact. moves all 4 extremities w/o difficulty. Affect pleasant.  ECG: NSR 96 1AVB (291ms) LBBB (148ms) Personally reviewed   No results found for this or any previous visit (from the past 24 hour(s)). No results found.   ASSESSMENT & PLAN:  1. Chronic systolic HF - due to presumed NICM but last cath 1995 - Echo 2017 EF 30-35% - Echo 10/20 EF 30-35% - Echo 4/21 in setting of covid with EF < 20% and severe RV dysfunction -  NYHA III. Also limited by obesity and COPD - Volume status ok REDs 31%. - Continue spiro 12.5 daily - Restart Entresto 24/26 bid. Long d/w him and his daughter and doubt he had angioedema but will follow closely - decrease carvedilol to 6.25 bid - Continue torsemide 20 bid. Can hold as needed.  - Consider SGLT2i soon - Suspect he may have LBBB cardiomyopathy but need to exclude CAD first. Also currently wearing Zio for PVC quantification. - Plan R/L cath next week  - labs today - If has LBBB CM will need CRT-P or D with Dr. Caryl Comes   2. Chronic hypoxic respiratory failure - quit smoking 1984 - recent COVID PNA - continue home O2  3. Snoring - high suspicion for OSA - Order home sleep study  4. LBBB - plan as above  5. Obesity - will need weight loss.  Glori Bickers, MD  2:11 PM

## 2019-09-06 NOTE — Patient Instructions (Addendum)
START Entresto DECREASE Carvedilol to 6.25 mg, one tab twice daily  Labs today We will only contact you if something comes back abnormal or we need to make some changes. Otherwise no news is good news!    You are scheduled for a Cardiac Catheterization on Wednesday, May 19 with Dr. Glori Bickers.  1. Please arrive at the [facility] (Main Entrance A) at [facility]: [address] [place], [place] [address] at 6:30 AM (This time is two hours before your procedure to ensure your preparation). Free valet parking service is available.   Special note: Every effort is made to have your procedure done on time. Please understand that emergencies sometimes delay scheduled procedures.  2. Diet: Do not eat solid foods after midnight.  The patient may have clear liquids until 5am upon the day of the procedure.  3. Labs: pre procedure labs done 09/06/2019 You will need a pre procedure COVID test    WHEN: 09/18/19 anytime between 9am-3pm WHERE: [facility]  [place] [address]  This is a drive thru testing site, you will remain in your car. Be sure to get in the line FOR PROCEDURES Once you have been swabbed you will need to remain home in quarantine until you return for your procedure.   4. Medication instructions in preparation for your procedure:   Contrast Allergy: No  Stop taking, Torsemide (Demadex) Wednesday, May 19,   On the morning of your procedure, take your Aspirin and any morning medicines NOT listed above.  You may use sips of water.  5. Plan for one night stay--bring personal belongings. 6. Bring a current list of your medications and current insurance cards. 7. You MUST have a responsible person to drive you home. 8. Someone MUST be with you the first 24 hours after you arrive home or your discharge will be delayed. 9. Please wear clothes that are easy to get on and off and wear slip-on shoes.  Thank you for allowing [place] to care for  you!   -- [facility]   Your physician recommends that you schedule a follow-up appointment in: 4 weeks with Dr Haroldine Laws  Your provider has recommended that you have a home sleep study.  [employer] is the company that does these test.  They will contact you by phone and must speak with you before they can ship the equipment.  Once they have spoken with you they will send the equipment right to your home with instructions on how to set it up.  Once you have completed the test you just dispose of the equipment, the information is automatically uploaded to [place] via [employer] technology.  IF you have any questions or issues with the equipment please call the company directly at [phone].  If your test is positive for sleep apnea and you need a home CPAP machine you will be contacted by Dr Theodosia Blender office [facility]) to set this up.

## 2019-09-06 NOTE — Progress Notes (Signed)
Patient Name:Ricky Hayden         DOB:  07/21/1945       Height: 5 6"    Weight: 238  Office Name:[facility]         Referring Provider:Daniel Bensimhon  Today's Date:09/06/2019  STOP BANG RISK ASSESSMENT S (snore) Have you been told that you snore?     YES   T (tired) Are you often tired, fatigued, or sleepy during the day?   YES  O (obstruction) Do you stop breathing, choke, or gasp during sleep? NO   P (pressure) Do you have or are you being treated for high blood pressure? YES   B (BMI) Is your body index greater than 35 kg/m? YES   A (age) Are you 34 years old or older? YES   N (neck) Do you have a neck circumference greater than 16 inches?   YES   G (gender) Are you a male? YES   TOTAL STOP/BANG "YES" ANSWERS                                                                        For Office Use Only              Procedure Order Form    YES to 3+ Stop Bang questions OR two clinical symptoms - patient qualifies for [employer] (CPT 95800)     Submit: This Form + Patient Face Sheet + Clinical Note via [employer] or Fax: [phone]         Clinical Notes: Will consult Sleep Specialist and refer for management of therapy due to patient increased risk of Sleep Apnea. Ordering a sleep study due to the following two clinical symptoms: Excessive daytime sleepiness  Loud snoring R06.83History of high blood pressure R03.0    I understand that I am proceeding with a home sleep apnea test as ordered by my treating physician. I understand that untreated sleep apnea is a serious cardiovascular risk factor and it is my responsibility to perform the test and seek management for sleep apnea. I will be contacted with the results and be managed for sleep apnea by a local sleep physician. I will be receiving equipment and further instructions from [facility]. I shall promptly ship back the equipment via the included mailing label. I understand my insurance will be billed  for the test and as the patient I am responsible for any insurance related out-of-pocket costs incurred. I have been provided with written instructions and can call for additional video or telephonic instruction, with 24-hour availability of qualified personnel to answer any questions: Patient Help Desk [phone].  Patient Signature ______________________________________________________   Date______________________ Patient Telemedicine Verbal Consent

## 2019-09-06 NOTE — H&P (View-Only) (Signed)
Referring: Dr. Sloan Leiter PCP: [place] Cardiologist: None   HPI:  Ricky Hayden is a 74 y.o. 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 Caryl Comes for further evaluation of his HF.   He has been followed at the [facility]. Referred to Dr. Caryl Comes in 4/21 for consideration of CRT-D.   He was seen by Dr. Caryl Comes with a televisit on 08/10/19. Based on his note he has had low EF off/on since 2016. In 2016 echo in our system showed EF 45-50%. Follow up echos at [facility]. He had an echo in 10/17 EF 30-35% Last echo 02/2019 was technically difficult, LVEF 30-35%, moderate global HK, diastolic function not well evaluated due to ectopy, mild LVH, moderate LAE, mild aortic regurgitation, mild-moderate mitral regurgitation, mild tricuspid regurgitation, mild aortic root dilatation (4.2cm). Losartanpreviously had to bestopped because of hyperkalemia.   Dr. Caryl Comes referred him to th HF program for further evaluation of possible underlying CA.   Unfortunately, he was admitted 4/10 to 08/09/19 with COVID PNA and HF. Echo repeated EF , 20 Severe RV dysfunction. Recovered and is here with his daughter for f/u. His wife died 2 weeks ago from [place].   On d/c from the hospital was sent home on carvedilol, Entresto, torsemide and spiro. There was a question of lower lip swelling and Entresto was stopped.   Had low BP and a fall and went back to the ER on 4/26. Several meds stopped. Had to restart torsemide due to 4.5pound weight gain.  Daughter has been following his closely and helping to adjust his meds. Now taking torsemide 20 bid, spiro 12.5 daily and carvedilol 12.5 bid.  He remembers undergoing a cath in 1995 through his groin and being told he had  something but it wasn't bad enough to do anything about. Was told that he possibly had a mild heart attack on the back side of his heart. He denies prior stenting/CABG or valve issues. His dad died of an MI at age 78.He drank significant ETOH in the past but quit 10 years ago. Quit smoking in 1984.  Says he feels fine. Gets around the house and yard with his walker. Wears O2. Has HHPT/OT/RN coming out. Denies CP. Dyspnea on mild exertion. Edema improved. Daughter says he snores.    Review of Systems:     Cardiac Review of Systems: {Y] = yes [ ]  = no  Chest Pain [    ]  Resting SOB [   ] Exertional SOB  [ y ]  Orthopnea [  ]   Pedal Edema [ y  ]    Palpitations [  ] Syncope  [  ]   Presyncope [   ]  General Review of Systems: [Y] = yes [  ]=no Constitional: recent weight change [ y ]; anorexia [  ]; fatigue [ y ]; nausea [  ]; night sweats [  ]; fever [  ]; or chills [  ];  Dental: poor dentition[  ];  Eye : blurred vision [  ]; diplopia [   ]; vision changes [  ];  Amaurosis fugax[  ]; Resp: cough [  ];  wheezing[  ];  hemoptysis[  ]; shortness of breath[ y ]; paroxysmal nocturnal dyspnea[  ]; dyspnea on exertion[ y ]; or orthopnea[  ];  GI:  gallstones[  ], vomiting[  ];  dysphagia[  ]; melena[  ];  hematochezia [  ]; heartburn[  ];   GU: kidney stones [  ]; hematuria[  ];   dysuria [  ];  nocturia[  ];  history of     obstruction [  ];                 Skin: rash, swelling[  ];, hair loss[  ];  peripheral edema[  ];  or itching[  ]; Musculosketetal: myalgias[  ];  joint swelling[  ];  joint erythema[  ];  joint pain[ y ];  back pain[  ];  Heme/Lymph: bruising[  ];  bleeding[  ];  anemia[  ];  Neuro: TIA[  ];  headaches[  ];  stroke[  ];  vertigo[  ];  seizures[  ];   paresthesias[  ];  difficulty walking[  ];  Psych:depression[  ]; anxiety[  ];  Endocrine:  diabetes[  ];  thyroid dysfunction[  ];     Past Medical History:  Diagnosis Date  . Arthritis    osteoarthritis of left knee  . Ascending aorta dilatation (HCC)   . Balanitis    recurrent  . Cardiac arrhythmia    life threatening, secondary to CCB vs b- blockers  . Cardiomyopathy ([place])   . Chronic joint pain   . Chronic systolic CHF (congestive heart failure) ([place])   . CKD (chronic kidney disease), stage III   . COPD (chronic obstructive pulmonary disease) ([place])   . Coronary artery disease   . Dilated aortic root ([place])   . Enlarged prostate   . 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 GERD (gastroesophageal reflux disease)   . Gout   . Hiatal hernia   . Hyperlipidemia   . Hypertension   . Intracranial hematoma ([place]) 1995   history of, s/p evacuation by Dr. Sherwood Gambler  . Nocturia   . Obesity   . Pansinusitis    a.  complicated by brain abscess and bleeding requiring craniotomy in 1995.  Marland Kitchen PONV (postoperative nausea and vomiting)   . Sinusitis    s/p ethmoidectomy and nasal septoplasty  . Vertigo    intermitantly    Current Outpatient Medications  Medication Sig Dispense Refill  . albuterol (PROVENTIL HFA;VENTOLIN HFA) 108 (90 BASE) MCG/ACT inhaler Inhale 2 puffs into the lungs every 6 (six) hours as needed for wheezing. 1 Inhaler 11  . allopurinol (ZYLOPRIM) 100 MG tablet Take 2 tablets (200 mg total) by mouth daily. 60 tablet 11  . budesonide-formoterol (SYMBICORT) 80-4.5 MCG/ACT inhaler Inhale 2 puffs into the lungs 2 (two) times daily. 1 Inhaler 12  . carvedilol (COREG) 12.5 MG tablet Take 1 tablet (12.5 mg total) by mouth 2 (two) times daily with a meal. 60 tablet 0  . diclofenac Sodium (VOLTAREN) 1 % GEL Apply 2 g topically daily as needed (pain).    Marland Kitchen docusate sodium (COLACE) 100 MG capsule Take 1-2 capsules (100-200 mg total) by mouth 2 (two) times daily as needed for mild constipation. 180 capsule 3  . finasteride (PROSCAR) 5 MG  tablet TAKE ONE  TABLET BY MOUTH ONCE DAILY 30 tablet 11  . omeprazole (PRILOSEC) 20 MG capsule Take 1 capsule (20 mg total) by mouth daily. 30 capsule 11  . PHENobarbital (LUMINAL) 64.8 MG tablet Take 1 tablet (64.8 mg total) by mouth 2 (two) times daily. 180 tablet 3  . polyethylene glycol (MIRALAX / GLYCOLAX) packet Take 17 g by mouth daily as needed for moderate constipation. 14 each 0  . pravastatin (PRAVACHOL) 40 MG tablet Take 1 tablet (40 mg total) by mouth every evening. 90 tablet 3  . SPIRIVA HANDIHALER 18 MCG inhalation capsule INHALE ONE DOSE BY MOUTH ONCE DAILY (Patient taking differently: Place 18 mcg into inhaler and inhale daily. ) 30 capsule 11  . spironolactone (ALDACTONE) 25 MG tablet Take 0.5 tablets (12.5 mg total) by mouth daily. 30 tablet 0  . tamsulosin (FLOMAX) 0.4 MG CAPS capsule Take 1 capsule (0.4 mg total) by mouth daily. 90 capsule 3  . torsemide (DEMADEX) 20 MG tablet Take 1 tablet (20 mg total) by mouth 2 (two) times daily. 30 tablet 0   No current facility-administered medications for this encounter.     Allergies  Allergen Reactions  . Calcium Channel Blockers Other (See Comments)    Came to hospital in 1995-caused chest pain   . Entresto [Sacubitril-Valsartan] Swelling    Possible lip swelling per pt's daughter    Social History   Socioeconomic History  . Marital status: Married    Spouse name: Not on file  . Number of children: Not on file  . Years of education: Not on file  . Highest education level: Not on file  Occupational History  . Not on file  Tobacco Use  . Smoking status: Former Smoker    Quit date: 05/04/1986    Years since quitting: 33.3  . Smokeless tobacco: Never Used  Substance and Sexual Activity  . Alcohol use: No    Alcohol/week: 0.0 standard drinks  . Drug use: No  . Sexual activity: Yes  Other Topics Concern  . Not on file  Social History Narrative  . Not on file   Social Determinants of Health   Financial Resource Strain:   .  Difficulty of Paying Living Expenses:   Food Insecurity:   . Worried About Charity fundraiser in the Last Year:   . Arboriculturist in the Last Year:   Transportation Needs:   . Film/video editor (Medical):   Marland Kitchen Lack of Transportation (Non-Medical):   Physical Activity:   . Days of Exercise per Week:   . Minutes of Exercise per Session:   Stress:   . Feeling of Stress :   Social Connections:   . Frequency of Communication with Friends and Family:   . Frequency of Social Gatherings with Friends and Family:   . Attends Religious Services:   . Active Member of Clubs or Organizations:   . Attends Archivist Meetings:   Marland Kitchen Marital Status:   Intimate Partner Violence:   . Fear of Current or Ex-Partner:   . Emotionally Abused:   Marland Kitchen Physically Abused:   . Sexually Abused:     Family History  Problem Relation Age of Onset  . Heart failure Mother 51  . Heart attack Father 78    PHYSICAL EXAM: Vitals:   09/06/19 1241  BP: 122/86  Pulse: (!) 103  SpO2: 98%   General:  Elderly obese male on O2 No respiratory difficulty HEENT: normal Neck: supple.  no JVD. Carotids 2+ bilat; no bruits. No lymphadenopathy or thryomegaly appreciated. Cor: PMI nondisplaced. Regular rate & rhythm. No rubs, gallops or murmurs. Lungs: clear with decreased BS Abdomen: soft, nontender, nondistended. No hepatosplenomegaly. No bruits or masses. Good bowel sounds. Extremities: no cyanosis, clubbing, rash, 1+ edema Neuro: alert & oriented x 3, cranial nerves grossly intact. moves all 4 extremities w/o difficulty. Affect pleasant.  ECG: NSR 96 1AVB (292ms) LBBB (158ms) Personally reviewed   No results found for this or any previous visit (from the past 24 hour(s)). No results found.   ASSESSMENT & PLAN:  1. Chronic systolic HF - due to presumed NICM but last cath 1995 - Echo 2017 EF 30-35% - Echo 10/20 EF 30-35% - Echo 4/21 in setting of covid with EF < 20% and severe RV dysfunction -  NYHA III. Also limited by obesity and COPD - Volume status ok REDs 31%. - Continue spiro 12.5 daily - Restart Entresto 24/26 bid. Long d/w him and his daughter and doubt he had angioedema but will follow closely - decrease carvedilol to 6.25 bid - Continue torsemide 20 bid. Can hold as needed.  - Consider SGLT2i soon - Suspect he may have LBBB cardiomyopathy but need to exclude CAD first. Also currently wearing Zio for PVC quantification. - Plan R/L cath next week  - labs today - If has LBBB CM will need CRT-P or D with Dr. Caryl Comes   2. Chronic hypoxic respiratory failure - quit smoking 1984 - recent COVID PNA - continue home O2  3. Snoring - high suspicion for OSA - Order home sleep study  4. LBBB - plan as above  5. Obesity - will need weight loss.  Glori Bickers, MD  2:11 PM

## 2019-09-08 ENCOUNTER — Telehealth (HOSPITAL_COMMUNITY): Payer: Self-pay | Admitting: *Deleted

## 2019-09-08 NOTE — Telephone Encounter (Signed)
pts daughter [name] left vm stating pts bp was in the 90's/70's and she wanted to know if that was ok. Also concerned about patients chest pain that comes and goes wants to know if maybe he should move his heart cath up. I called [name] and left VM for her to return my call. I called the patient and was unable to leave a VM because the VM was full.

## 2019-09-11 ENCOUNTER — Ambulatory Visit: Payer: No Typology Code available for payment source | Admitting: Physician Assistant

## 2019-09-18 NOTE — Progress Notes (Signed)
Pt will not need re-testing for COVID d/t testing positive in the last 90 days per hospital policy.    POC SARS Coronavirus 2 Ag-ED - Nasal Swab (BD Veritor Kit) Order: [id number] Status:  Final result Visible to patient:  Yes (MyChart) Next appt:  10/12/2019 at 03:00 PM in Cardiology ([facility] PA/NP)  Ref Range & Units 1 mo ago  SARS Coronavirus 2 Ag Negative PositiveAbnormal        Specimen Collected: 08/12/19 13:59 Last Resulted: 08/12/19 13:59

## 2019-09-20 ENCOUNTER — Ambulatory Visit (HOSPITAL_COMMUNITY)
Admission: RE | Admit: 2019-09-20 | Discharge: 2019-09-20 | Disposition: A | Discharge status: Home / Self Care | Payer: No Typology Code available for payment source | Source: Ambulatory Visit | Attending: Internal Medicine | Admitting: Internal Medicine

## 2019-09-20 ENCOUNTER — Encounter (HOSPITAL_COMMUNITY)
Admission: RE | Disposition: A | Discharge status: Home / Self Care | Payer: Self-pay | Source: Ambulatory Visit | Attending: Internal Medicine

## 2019-09-20 DIAGNOSIS — I447 Left bundle-branch block, unspecified: Secondary | ICD-10-CM | POA: Insufficient documentation

## 2019-09-20 DIAGNOSIS — Z8661 Personal history of infections of the central nervous system: Secondary | ICD-10-CM | POA: Insufficient documentation

## 2019-09-20 DIAGNOSIS — N183 Chronic kidney disease, stage 3 unspecified: Secondary | ICD-10-CM | POA: Diagnosis not present

## 2019-09-20 DIAGNOSIS — Z87891 Personal history of nicotine dependence: Secondary | ICD-10-CM | POA: Diagnosis not present

## 2019-09-20 DIAGNOSIS — E785 Hyperlipidemia, unspecified: Secondary | ICD-10-CM | POA: Insufficient documentation

## 2019-09-20 DIAGNOSIS — J9611 Chronic respiratory failure with hypoxia: Secondary | ICD-10-CM | POA: Diagnosis not present

## 2019-09-20 DIAGNOSIS — N400 Enlarged prostate without lower urinary tract symptoms: Secondary | ICD-10-CM | POA: Diagnosis not present

## 2019-09-20 DIAGNOSIS — Z7951 Long term (current) use of inhaled steroids: Secondary | ICD-10-CM | POA: Diagnosis not present

## 2019-09-20 DIAGNOSIS — M109 Gout, unspecified: Secondary | ICD-10-CM | POA: Diagnosis not present

## 2019-09-20 DIAGNOSIS — G40909 Epilepsy, unspecified, not intractable, without status epilepticus: Secondary | ICD-10-CM | POA: Diagnosis not present

## 2019-09-20 DIAGNOSIS — I130 Hypertensive heart and chronic kidney disease with heart failure and stage 1 through stage 4 chronic kidney disease, or unspecified chronic kidney disease: Secondary | ICD-10-CM | POA: Insufficient documentation

## 2019-09-20 DIAGNOSIS — I2510 Atherosclerotic heart disease of native coronary artery without angina pectoris: Secondary | ICD-10-CM | POA: Insufficient documentation

## 2019-09-20 DIAGNOSIS — K219 Gastro-esophageal reflux disease without esophagitis: Secondary | ICD-10-CM | POA: Diagnosis not present

## 2019-09-20 DIAGNOSIS — Z8616 Personal history of COVID-19: Secondary | ICD-10-CM | POA: Diagnosis not present

## 2019-09-20 DIAGNOSIS — Z6839 Body mass index (BMI) 39.0-39.9, adult: Secondary | ICD-10-CM | POA: Insufficient documentation

## 2019-09-20 DIAGNOSIS — Z79899 Other long term (current) drug therapy: Secondary | ICD-10-CM | POA: Diagnosis not present

## 2019-09-20 DIAGNOSIS — I5022 Chronic systolic (congestive) heart failure: Secondary | ICD-10-CM

## 2019-09-20 DIAGNOSIS — Z8249 Family history of ischemic heart disease and other diseases of the circulatory system: Secondary | ICD-10-CM | POA: Diagnosis not present

## 2019-09-20 DIAGNOSIS — Z888 Allergy status to other drugs, medicaments and biological substances status: Secondary | ICD-10-CM | POA: Insufficient documentation

## 2019-09-20 DIAGNOSIS — I428 Other cardiomyopathies: Secondary | ICD-10-CM | POA: Insufficient documentation

## 2019-09-20 DIAGNOSIS — J449 Chronic obstructive pulmonary disease, unspecified: Secondary | ICD-10-CM | POA: Diagnosis not present

## 2019-09-20 HISTORY — PX: RIGHT/LEFT HEART CATH AND CORONARY ANGIOGRAPHY: CATH118266

## 2019-09-20 LAB — POCT I-STAT EG7
Acid-Base Excess: 4 mmol/L — ABNORMAL HIGH (ref 0.0–2.0)
Acid-Base Excess: 7 mmol/L — ABNORMAL HIGH (ref 0.0–2.0)
Bicarbonate: 32.1 mmol/L — ABNORMAL HIGH (ref 20.0–28.0)
Bicarbonate: 36.3 mmol/L — ABNORMAL HIGH (ref 20.0–28.0)
Calcium, Ion: 1.06 mmol/L — ABNORMAL LOW (ref 1.15–1.40)
Calcium, Ion: 1.25 mmol/L (ref 1.15–1.40)
HCT: 40 % (ref 39.0–52.0)
HCT: 40 % (ref 39.0–52.0)
Hemoglobin: 13.6 g/dL (ref 13.0–17.0)
Hemoglobin: 13.6 g/dL (ref 13.0–17.0)
O2 Saturation: 64 %
O2 Saturation: 70 %
Potassium: 3.5 mmol/L (ref 3.5–5.1)
Potassium: 4.1 mmol/L (ref 3.5–5.1)
Sodium: 143 mmol/L (ref 135–145)
Sodium: 147 mmol/L — ABNORMAL HIGH (ref 135–145)
TCO2: 34 mmol/L — ABNORMAL HIGH (ref 22–32)
TCO2: 38 mmol/L — ABNORMAL HIGH (ref 22–32)
pCO2, Ven: 62.5 mmHg — ABNORMAL HIGH (ref 44.0–60.0)
pCO2, Ven: 70.8 mmHg (ref 44.0–60.0)
pH, Ven: 7.317 (ref 7.250–7.430)
pH, Ven: 7.318 (ref 7.250–7.430)
pO2, Ven: 38 mmHg (ref 32.0–45.0)
pO2, Ven: 41 mmHg (ref 32.0–45.0)

## 2019-09-20 LAB — POCT I-STAT 7, (LYTES, BLD GAS, ICA,H+H)
Acid-Base Excess: 6 mmol/L — ABNORMAL HIGH (ref 0.0–2.0)
Bicarbonate: 33.6 mmol/L — ABNORMAL HIGH (ref 20.0–28.0)
Calcium, Ion: 1.05 mmol/L — ABNORMAL LOW (ref 1.15–1.40)
HCT: 36 % — ABNORMAL LOW (ref 39.0–52.0)
Hemoglobin: 12.2 g/dL — ABNORMAL LOW (ref 13.0–17.0)
O2 Saturation: 94 %
Potassium: 3.5 mmol/L (ref 3.5–5.1)
Sodium: 146 mmol/L — ABNORMAL HIGH (ref 135–145)
TCO2: 36 mmol/L — ABNORMAL HIGH (ref 22–32)
pCO2 arterial: 63.2 mmHg — ABNORMAL HIGH (ref 32.0–48.0)
pH, Arterial: 7.334 — ABNORMAL LOW (ref 7.350–7.450)
pO2, Arterial: 76 mmHg — ABNORMAL LOW (ref 83.0–108.0)

## 2019-09-20 SURGERY — RIGHT/LEFT HEART CATH AND CORONARY ANGIOGRAPHY
Anesthesia: LOCAL

## 2019-09-20 MED ORDER — VERAPAMIL HCL 2.5 MG/ML IV SOLN
INTRAVENOUS | Status: AC
  Filled 2019-09-20: qty 2

## 2019-09-20 MED ORDER — ASPIRIN 81 MG PO CHEW: 81.0000 mg | CHEWABLE_TABLET | ORAL | Status: DC

## 2019-09-20 MED ORDER — HEPARIN SODIUM (PORCINE) 1000 UNIT/ML IJ SOLN
INTRAMUSCULAR | Status: DC | PRN
  Administered 2019-09-20: 5000 [IU] via INTRAVENOUS

## 2019-09-20 MED ORDER — HEPARIN (PORCINE) IN NACL 1000-0.9 UT/500ML-% IV SOLN
INTRAVENOUS | Status: DC | PRN
  Administered 2019-09-20 (×2): 500 mL

## 2019-09-20 MED ORDER — SODIUM CHLORIDE 0.9% FLUSH: 3.0000 mL | Freq: Two times a day (BID) | INTRAVENOUS | Status: DC

## 2019-09-20 MED ORDER — VERAPAMIL HCL 2.5 MG/ML IV SOLN
INTRAVENOUS | Status: DC | PRN
  Administered 2019-09-20: 10 mL via INTRA_ARTERIAL

## 2019-09-20 MED ORDER — SODIUM CHLORIDE 0.9% FLUSH: 3.0000 mL | INTRAVENOUS | Status: DC | PRN

## 2019-09-20 MED ORDER — HEPARIN (PORCINE) IN NACL 1000-0.9 UT/500ML-% IV SOLN
INTRAVENOUS | Status: AC
  Filled 2019-09-20: qty 1000

## 2019-09-20 MED ORDER — HEPARIN SODIUM (PORCINE) 1000 UNIT/ML IJ SOLN
INTRAMUSCULAR | Status: AC
  Filled 2019-09-20: qty 1

## 2019-09-20 MED ORDER — IOHEXOL 350 MG/ML SOLN
INTRAVENOUS | Status: DC | PRN
  Administered 2019-09-20: 110 mL via INTRA_ARTERIAL

## 2019-09-20 MED ORDER — MIDAZOLAM HCL 2 MG/2ML IJ SOLN
INTRAMUSCULAR | Status: AC
  Filled 2019-09-20: qty 2

## 2019-09-20 MED ORDER — SODIUM CHLORIDE 0.9 % IV SOLN: 250.0000 mL | INTRAVENOUS | Status: DC | PRN

## 2019-09-20 MED ORDER — LIDOCAINE HCL (PF) 1 % IJ SOLN
INTRAMUSCULAR | Status: AC
  Filled 2019-09-20: qty 30

## 2019-09-20 MED ORDER — MIDAZOLAM HCL 2 MG/2ML IJ SOLN
INTRAMUSCULAR | Status: DC | PRN
  Administered 2019-09-20: 1 mg via INTRAVENOUS

## 2019-09-20 MED ORDER — SODIUM CHLORIDE 0.9 % IV SOLN: INTRAVENOUS | Status: DC

## 2019-09-20 MED ORDER — FENTANYL CITRATE (PF) 100 MCG/2ML IJ SOLN
INTRAMUSCULAR | Status: AC
  Filled 2019-09-20: qty 2

## 2019-09-20 MED ORDER — LIDOCAINE HCL (PF) 1 % IJ SOLN
INTRAMUSCULAR | Status: DC | PRN
  Administered 2019-09-20: 5 mL

## 2019-09-20 MED ORDER — FENTANYL CITRATE (PF) 100 MCG/2ML IJ SOLN
INTRAMUSCULAR | Status: DC | PRN
  Administered 2019-09-20: 25 ug via INTRAVENOUS

## 2019-09-20 SURGICAL SUPPLY — 13 items
CATH 5FR JL3.5 JR4 ANG PIG MP (CATHETERS) ×1 IMPLANT
CATH BALLN WEDGE 5F 110CM (CATHETERS) ×1 IMPLANT
CATH INFINITI 5 FR AR1 MOD (CATHETERS) ×1 IMPLANT
CATH INFINITI 5FR AL1 (CATHETERS) ×1 IMPLANT
DEVICE RAD COMP TR BAND LRG (VASCULAR PRODUCTS) ×1 IMPLANT
GLIDESHEATH SLEND SS 6F .021 (SHEATH) ×1 IMPLANT
GUIDEWIRE INQWIRE 1.5J.035X260 (WIRE) IMPLANT
INQWIRE 1.5J .035X260CM (WIRE) ×2
KIT HEART LEFT (KITS) ×1 IMPLANT
PACK CARDIAC CATHETERIZATION (CUSTOM PROCEDURE TRAY) ×2 IMPLANT
SHEATH GLIDE SLENDER 4/5FR (SHEATH) ×1 IMPLANT
SHEATH PROBE COVER 6X72 (BAG) ×1 IMPLANT
TRANSDUCER W/STOPCOCK (MISCELLANEOUS) ×2 IMPLANT

## 2019-09-20 NOTE — Interval H&P Note (Signed)
History and Physical Interval Note:  09/20/2019 8:40 AM  Ricky Hayden  has presented today for surgery, with the diagnosis of Heart failure.  The various methods of treatment have been discussed with the patient and family. After consideration of risks, benefits and other options for treatment, the patient has consented to  Procedure(s): RIGHT/LEFT HEART CATH AND CORONARY ANGIOGRAPHY (N/A) and possible coronary angioplasty as a surgical intervention.  The patient's history has been reviewed, patient examined, no change in status, stable for surgery.  I have reviewed the patient's chart and labs.  Questions were answered to the patient's satisfaction.     Barkley Kratochvil

## 2019-09-20 NOTE — Discharge Instructions (Signed)
Radial Site Care  This sheet gives you information about how to care for yourself after your procedure. Your health care provider may also give you more specific instructions. If you have problems or questions, contact your health care provider. What can I expect after the procedure? After the procedure, it is common to have:  Bruising and tenderness at the catheter insertion area. Follow these instructions at home: Medicines  Take over-the-counter and prescription medicines only as told by your health care provider. Insertion site care  Follow instructions from your health care provider about how to take care of your insertion site. Make sure you: ? Wash your hands with soap and water before you change your bandage (dressing). If soap and water are not available, use hand sanitizer. ? Change your dressing as told by your health care provider. ? Leave stitches (sutures), skin glue, or adhesive strips in place. These skin closures may need to stay in place for 2 weeks or longer. If adhesive strip edges start to loosen and curl up, you may trim the loose edges. Do not remove adhesive strips completely unless your health care provider tells you to do that.  Check your insertion site every day for signs of infection. Check for: ? Redness, swelling, or pain. ? Fluid or blood. ? Pus or a bad smell. ? Warmth.  Do not take baths, swim, or use a hot tub until your health care provider approves.  You may shower 24-48 hours after the procedure, or as directed by your health care provider. ? Remove the dressing and gently wash the site with plain soap and water. ? Pat the area dry with a clean towel. ? Do not rub the site. That could cause bleeding.  Do not apply powder or lotion to the site. Activity   For 24 hours after the procedure, or as directed by your health care provider: ? Do not flex or bend the affected arm. ? Do not push or pull heavy objects with the affected arm. ? Do not  drive yourself home from the hospital or clinic. You may drive 24 hours after the procedure unless your health care provider tells you not to. ? Do not operate machinery or power tools.  Do not lift anything that is heavier than 10 lb (4.5 kg), or the limit that you are told, until your health care provider says that it is safe.  Ask your health care provider when it is okay to: ? Return to work or school. ? Resume usual physical activities or sports. ? Resume sexual activity. General instructions  If the catheter site starts to bleed, raise your arm and put firm pressure on the site. If the bleeding does not stop, get help right away. This is a medical emergency.  If you went home on the same day as your procedure, a responsible adult should be with you for the first 24 hours after you arrive home.  Keep all follow-up visits as told by your health care provider. This is important. Contact a health care provider if:  You have a fever.  You have redness, swelling, or yellow drainage around your insertion site. Get help right away if:  You have unusual pain at the radial site.  The catheter insertion area swells very fast.  The insertion area is bleeding, and the bleeding does not stop when you hold steady pressure on the area.  Your arm or hand becomes pale, cool, tingly, or numb. These symptoms may represent a serious problem   that is an emergency. Do not wait to see if the symptoms will go away. Get medical help right away. Call your local emergency services (911 in the [place] Do not drive yourself to the hospital. Summary  After the procedure, it is common to have bruising and tenderness at the site.  Follow instructions from your health care provider about how to take care of your radial site wound. Check the wound every day for signs of infection.  Do not lift anything that is heavier than 10 lb (4.5 kg), or the limit that you are told, until your health care provider says  that it is safe. This information is not intended to replace advice given to you by your health care provider. Make sure you discuss any questions you have with your health care provider. Document Revised: 05/26/2017 Document Reviewed: 05/26/2017 Elsevier Patient Education  2020 Elsevier Inc.  

## 2019-09-20 NOTE — Progress Notes (Signed)
Arm splint applied to right arm.

## 2019-09-20 NOTE — Progress Notes (Signed)
Discharge instructions reviewed with pt and [name] (via telephone) both voice understanding.

## 2019-10-08 ENCOUNTER — Telehealth: Payer: Self-pay | Admitting: Physician Assistant

## 2019-10-08 NOTE — Telephone Encounter (Signed)
   [name] [name] called the answering service after-hours today. Chart reviewed. I had spoken with patient via phone in 08/2019 when he was admitted with Covid. His wife was admitted at the same time with Covid as well and unfortunately passed due to the virus. He has been sleeping poorly the past 2 weeks because of bad dreams. He otherwise has been doing OK physically. Had cath recently with only minimal CAD. This afternoon he was sleeping in a recliner deeply. She went to arouse him and it took a little bit longer to fully wake him up. She thinks this was because he had not had any restful sleep lately. No focal neurologic complaints except he did note his left arm and left shoulder were hurting him. He attributed this to the way he was lying. She checked his VS - BP 109/76 and HR 74. She was concerned because his HR typically runs 90s. He got up and repositioned and now feels much better, requesting to eat lunch. She wanted reassurance that HR of 74 was still OK - per chart review even when he was here with Covid his HR was 60s-90s, so I feel this is of no acute concern. We did discuss that it's difficult over the phone to fully exclude the things she is acutely concerned about, namely MI, as she wondered if normal VS could still be a heart attack. She is very nervous about this. We discussed that ER would be an option for formal evaluation to make this exclusion for reassurance, as there are other things like arrhythmia that can cause these symptoms. Given recent unrevealing cath, we did discuss that MI is less likely. Per her input, she plans to continue to monitor her father at home carefully the rest of the day and will have low threshold to seek care if any concerning symptoms. It sounds like she is doing a great job diligently caring for him.  They have f/u with CHF team later this week.  Charlie Pitter, PA-C

## 2019-10-11 ENCOUNTER — Telehealth (HOSPITAL_COMMUNITY): Payer: Self-pay | Admitting: *Deleted

## 2019-10-11 NOTE — Telephone Encounter (Signed)
Received fax from [facility], pt missed appt with them on 10/06/19

## 2019-10-12 ENCOUNTER — Encounter (HOSPITAL_COMMUNITY): Payer: Self-pay

## 2019-10-12 ENCOUNTER — Ambulatory Visit (HOSPITAL_COMMUNITY)
Admission: RE | Admit: 2019-10-12 | Discharge: 2019-10-12 | Disposition: A | Discharge status: Home / Self Care | Payer: No Typology Code available for payment source | Source: Ambulatory Visit | Attending: Cardiology | Admitting: Cardiology

## 2019-10-12 ENCOUNTER — Other Ambulatory Visit: Payer: Self-pay

## 2019-10-12 VITALS — BP 116/82 | HR 75 | Wt 250.8 lb

## 2019-10-12 DIAGNOSIS — Z6841 Body Mass Index (BMI) 40.0 and over, adult: Secondary | ICD-10-CM | POA: Insufficient documentation

## 2019-10-12 DIAGNOSIS — G40909 Epilepsy, unspecified, not intractable, without status epilepticus: Secondary | ICD-10-CM | POA: Insufficient documentation

## 2019-10-12 DIAGNOSIS — K219 Gastro-esophageal reflux disease without esophagitis: Secondary | ICD-10-CM | POA: Diagnosis not present

## 2019-10-12 DIAGNOSIS — M109 Gout, unspecified: Secondary | ICD-10-CM | POA: Insufficient documentation

## 2019-10-12 DIAGNOSIS — I130 Hypertensive heart and chronic kidney disease with heart failure and stage 1 through stage 4 chronic kidney disease, or unspecified chronic kidney disease: Secondary | ICD-10-CM | POA: Diagnosis not present

## 2019-10-12 DIAGNOSIS — Z8249 Family history of ischemic heart disease and other diseases of the circulatory system: Secondary | ICD-10-CM | POA: Diagnosis not present

## 2019-10-12 DIAGNOSIS — Z888 Allergy status to other drugs, medicaments and biological substances status: Secondary | ICD-10-CM | POA: Diagnosis not present

## 2019-10-12 DIAGNOSIS — I428 Other cardiomyopathies: Secondary | ICD-10-CM | POA: Diagnosis not present

## 2019-10-12 DIAGNOSIS — N400 Enlarged prostate without lower urinary tract symptoms: Secondary | ICD-10-CM | POA: Diagnosis not present

## 2019-10-12 DIAGNOSIS — I5042 Chronic combined systolic (congestive) and diastolic (congestive) heart failure: Secondary | ICD-10-CM

## 2019-10-12 DIAGNOSIS — I2510 Atherosclerotic heart disease of native coronary artery without angina pectoris: Secondary | ICD-10-CM | POA: Insufficient documentation

## 2019-10-12 DIAGNOSIS — I5022 Chronic systolic (congestive) heart failure: Secondary | ICD-10-CM | POA: Diagnosis present

## 2019-10-12 DIAGNOSIS — Z79899 Other long term (current) drug therapy: Secondary | ICD-10-CM | POA: Insufficient documentation

## 2019-10-12 DIAGNOSIS — I447 Left bundle-branch block, unspecified: Secondary | ICD-10-CM | POA: Diagnosis not present

## 2019-10-12 DIAGNOSIS — J9611 Chronic respiratory failure with hypoxia: Secondary | ICD-10-CM | POA: Diagnosis not present

## 2019-10-12 DIAGNOSIS — I5023 Acute on chronic systolic (congestive) heart failure: Secondary | ICD-10-CM | POA: Insufficient documentation

## 2019-10-12 DIAGNOSIS — Z7984 Long term (current) use of oral hypoglycemic drugs: Secondary | ICD-10-CM | POA: Diagnosis not present

## 2019-10-12 DIAGNOSIS — J449 Chronic obstructive pulmonary disease, unspecified: Secondary | ICD-10-CM | POA: Diagnosis not present

## 2019-10-12 DIAGNOSIS — Z8616 Personal history of COVID-19: Secondary | ICD-10-CM | POA: Diagnosis not present

## 2019-10-12 DIAGNOSIS — Z7951 Long term (current) use of inhaled steroids: Secondary | ICD-10-CM | POA: Insufficient documentation

## 2019-10-12 DIAGNOSIS — E785 Hyperlipidemia, unspecified: Secondary | ICD-10-CM | POA: Diagnosis not present

## 2019-10-12 DIAGNOSIS — Z87891 Personal history of nicotine dependence: Secondary | ICD-10-CM | POA: Diagnosis not present

## 2019-10-12 DIAGNOSIS — N183 Chronic kidney disease, stage 3 unspecified: Secondary | ICD-10-CM | POA: Diagnosis not present

## 2019-10-12 LAB — BASIC METABOLIC PANEL
Anion gap: 7 (ref 5–15)
BUN: 30 mg/dL — ABNORMAL HIGH (ref 8–23)
CO2: 34 mmol/L — ABNORMAL HIGH (ref 22–32)
Calcium: 9.2 mg/dL (ref 8.9–10.3)
Chloride: 101 mmol/L (ref 98–111)
Creatinine, Ser: 1.28 mg/dL — ABNORMAL HIGH (ref 0.61–1.24)
GFR calc Af Amer: >60 mL/min (ref >60)
GFR calc non Af Amer: 54 mL/min — ABNORMAL LOW (ref >60)
Glucose, Bld: 94 mg/dL (ref 70–99)
Potassium: 4.5 mmol/L (ref 3.5–5.1)
Sodium: 142 mmol/L (ref 135–145)

## 2019-10-12 LAB — BRAIN NATRIURETIC PEPTIDE: B Natriuretic Peptide: 1156.6 pg/mL — ABNORMAL HIGH (ref 0.0–100.0)

## 2019-10-12 MED ORDER — DAPAGLIFLOZIN PROPANEDIOL 10 MG PO TABS
10.0000 mg | ORAL_TABLET | Freq: Every day | ORAL | 11 refills | Status: DC
Start: 2019-10-12 — End: 2019-10-12

## 2019-10-12 MED ORDER — DAPAGLIFLOZIN PROPANEDIOL 10 MG PO TABS: 10.0000 mg | ORAL_TABLET | Freq: Every day | ORAL | 11 refills | Status: DC

## 2019-10-12 NOTE — Patient Instructions (Signed)
START Farxiga 10 mg one tab daily  You have been referred to [facility] for IV lasix in the home for 3 days. Be sure to hold your torsemide during your IV lasix therapy. They will be in touch with you to arrange a at home visit  Labs today We will only contact you if something comes back abnormal or we need to make some changes. Otherwise no news is good news!  Labs needed in one week  Your physician recommends that you schedule a follow-up appointment in: 3-4 weeks  in the Advanced Practitioners (PA/NP) Clinic   Do the following things EVERYDAY: 1) Weigh yourself in the morning before breakfast. Write it down and keep it in a log. 2) Take your medicines as prescribed 3) Eat low salt foods--Limit salt (sodium) to 2000 mg per day.  4) Stay as active as you can everyday 5) Limit all fluids for the day to less than 2 liters  At the [facility], you and your health needs are our priority. As part of our continuing mission to provide you with exceptional heart care, we have created designated Provider Care Teams. These Care Teams include your primary Cardiologist (physician) and Advanced Practice Providers (APPs- Physician Assistants and Nurse Practitioners) who all work together to provide you with the care you need, when you need it.   You may see any of the following providers on your designated Care Team at your next follow up:  Dr Glori Bickers  Dr Haynes Kerns, NP  Lyda Jester, [place]  Audry Riles, PharmD   Please be sure to bring in all your medications bottles to every appointment.

## 2019-10-12 NOTE — Progress Notes (Signed)
Referring: Dr. Sloan Leiter PCP: [place] Cardiologist: None [facility]: Dr. Haroldine Laws   Reason for Visit: f/u for chronic systolic heart failure    HPI:  Ricky Hayden is a 74 y.o. 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 Caryl Comes for further evaluation of his HF.   He has been followed at the [facility]. Referred to Dr. Caryl Comes in 4/21 for consideration of CRT-D.   He was seen by Dr. Caryl Comes with a televisit on 08/10/19. Based on his note he has had low EF off/on since 2016. In 2016 echo in our system showed EF 45-50%. Follow up echos at [facility]. He had an echo in 10/17 EF 30-35% Last echo 02/2019 was technically difficult, LVEF 30-35%, moderate global HK, diastolic function not well evaluated due to ectopy, mild LVH, moderate LAE, mild aortic regurgitation, mild-moderate mitral regurgitation, mild tricuspid regurgitation, mild aortic root dilatation (4.2cm). Losartanpreviously had to bestopped because of hyperkalemia.  Dr. Caryl Comes referred him to th HF program for further evaluation of possible underlying CAD.   Unfortunately, he was admitted 4/10 to 08/09/19 with COVID PNA and HF. Echo repeated and EF back down to 20% w/ severe RV dysfunction. He recovered from [place] but unfortunately, his wife died from [place].   On d/c from the hospital was sent home on carvedilol, Entresto, torsemide and spiro. There was a question of lower lip swelling and Entresto was stopped.   Had low BP and a fall and went back to the ER on 4/26. Several meds stopped. Had to restart torsemide due to 4.5 pound weight gain.  Daughter has been following him closely and helping to adjust his meds. Now taking torsemide 40/20 mg daily , spiro 12.5 daily and  carvedilol 12.5 bid.  He remembers undergoing a cath in 1995 through his groin and being told he had something but it wasn't bad enough to do anything about. Was told that he possibly had a mild heart attack on the back side of his heart. His dad died of an MI at age 52.He drank significant ETOH in the past but quit 10 years ago. Quit smoking in 1984.   Pt was seen by Dr. Jeffie Pollock 09/05/19 and referred for [facility].R/LHC showed minimal CAD and mild to moderately elevated filling pressures with normal cardiac output. Findings c/w NICM. Also noted to have frequent PVCs. ? PVC induced CM. He was ordered to wear Zio patch to help quantify PVC burden. Also ordered to get a home sleep study to r/o OSA. He has completed Zio monitor and is returning it today. Home sleep study not yet completed.  He presents to clinic today w/ 1 of his daughters for f/u. He feels bad. Tired. More SOB w/ physical activity. No resting dyspnea, but has noticed increased orthopnea. He is on chronic home O2 at 2L/min. He has had progressive wt gain in the last 4 weeks, up 11 lb. Reports full med compliance. He tried increasing his torsemide for the last 2 days to 40 mg qam + 20 mg qpm w/ little improvement. Urination sluggish. Vital signs stable. Comfortable at rest.       [facility] Conclusion 09/2019     Prox LAD to Mid LAD lesion is 25% stenosed.  Prox Cx to Dist Cx lesion is  25% stenosed.   Findings:  Ao = 98/69 (81) LV = 101/24 RA = 11 RV = 59/15 PA = 64/23 (40) PCW = 19 Fick cardiac output/index = 5.6/2.6 PVR = 3.5 WU  FA sat = 94% PA sat = 64%, 70%  Assessment:  1. Minimal CAD 2. Nonischemic CM 3. Mild to moderately elevated filling pressures with normal cardiac output  2D Echo 4/21 1. Left ventricular ejection fraction, by estimation, is <20%. The left ventricle has severely decreased function. The left ventricle demonstrates global hypokinesis. The left ventricular internal cavity size was mildly  dilated. There is mild concentric left ventricular hypertrophy. Left ventricular diastolic function could not be evaluated. Elevated left ventricular enddiastolic pressure. 2. Right ventricular systolic function is severely reduced. The right ventricular size is severely enlarged. There is moderately elevated pulmonary artery systolic pressure. 3. The mitral valve is normal in structure. Mild mitral valve regurgitation. No evidence of mitral stenosis. 4. The aortic valve is tricuspid. Aortic valve regurgitation is trivial. Mild to moderate aortic valve sclerosis/calcification is present, without any evidence of aortic stenosis. 5. Aortic dilatation noted. There is mild dilatation of the aortic root and of the ascending aorta measuring 41 mm and 8mm respectively. 6. The inferior vena cava is dilated in size with <50% respiratory variability, suggesting right atrial pressure of 15 mmHg. 7. Left atrial size was severely dilated. 8. Right atrial size was mildly dilated.  Review of Systems:     Cardiac Review of Systems: {Y] = yes [ ]  = no  Chest Pain [    ]  Resting SOB [   ] Exertional SOB  [ y ]  Orthopnea [  ]   Pedal Edema [ y  ]    Palpitations [  ] Syncope  [  ]   Presyncope [   ]  General Review of Systems: [Y] = yes [  ]=no Constitional: recent weight change [ y ]; anorexia [  ]; fatigue [ y ]; nausea [  ]; night sweats [  ]; fever [  ]; or chills [  ];                                                                                                                                          Dental: poor dentition[  ];  Eye : blurred vision [  ]; diplopia [   ]; vision changes [  ];  Amaurosis fugax[  ]; Resp: cough Blue.Reese  ];  wheezing[  ];  hemoptysis[  ]; shortness of breath[ y ]; paroxysmal nocturnal dyspnea[  ]; dyspnea on exertion[ y ]; or orthopnea[  ];  GI:  gallstones[  ], vomiting[  ];  dysphagia[  ]; melena[  ];  hematochezia [  ]; heartburn[  ];   GU: kidney stones [  ];  hematuria[  ];   dysuria [  ];  nocturia[  ];  history of     obstruction [  ];                 Skin: rash, swelling[  ];, hair loss[  ];  peripheral edema[  ];  or itching[  ]; Musculosketetal: myalgias[  ];  joint swelling[  ];  joint erythema[  ];  joint pain[ y ];  back pain[  ];  Heme/Lymph: bruising[  ];  bleeding[  ];  anemia[  ];  Neuro: TIA[  ];  headaches[  ];  stroke[  ];  vertigo[  ];  seizures[  ];   paresthesias[  ];  difficulty walking[  ];  Psych:depression[  ]; anxiety[  ];  Endocrine: diabetes[  ];  thyroid dysfunction[  ];     Past Medical History:  Diagnosis Date  . Arthritis    osteoarthritis of left knee  . Ascending aorta dilatation (HCC)   . Balanitis    recurrent  . Cardiac arrhythmia    life threatening, secondary to CCB vs b- blockers  . Cardiomyopathy ([place])   . Chronic joint pain   . Chronic systolic CHF (congestive heart failure) ([place])   . CKD (chronic kidney disease), stage III   . COPD (chronic obstructive pulmonary disease) ([place])   . Coronary artery disease   . Dilated aortic root ([place])   . Enlarged prostate   . 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 GERD (gastroesophageal reflux disease)   . Gout   . Hiatal hernia   . Hyperlipidemia   . Hypertension   . Intracranial hematoma ([place]) 1995   history of, s/p evacuation by Dr. Sherwood Gambler  . Nocturia   . Obesity   . Pansinusitis    a.  complicated by brain abscess and bleeding requiring craniotomy in 1995.  Marland Kitchen PONV (postoperative nausea and vomiting)   . Sinusitis    s/p ethmoidectomy and nasal septoplasty  . Vertigo    intermitantly    Current Outpatient Medications  Medication Sig Dispense Refill  . albuterol (PROVENTIL HFA;VENTOLIN HFA) 108 (90 BASE) MCG/ACT inhaler Inhale 2 puffs into the lungs every 6 (six) hours as needed for wheezing. 1 Inhaler 11  . budesonide-formoterol (SYMBICORT) 80-4.5 MCG/ACT inhaler Inhale 2 puffs into the lungs 2 (two) times daily. 1 Inhaler  12  . carvedilol (COREG) 6.25 MG tablet Take 1 tablet (6.25 mg total) by mouth 2 (two) times daily with a meal. 60 tablet 3  . docusate sodium (COLACE) 100 MG capsule Take 1-2 capsules (100-200 mg total) by mouth 2 (two) times daily as needed for mild constipation. 180 capsule 3  . finasteride (PROSCAR) 5 MG tablet TAKE ONE TABLET BY MOUTH ONCE DAILY (Patient taking differently: Take 5 mg by mouth daily. ) 30 tablet 11  . Multiple Vitamins-Minerals (ICAPS AREDS 2 PO) Take 2 tablets by mouth daily.    Marland Kitchen omeprazole (PRILOSEC) 20 MG capsule Take 1 capsule (20 mg total) by mouth daily. 30 capsule 11  . PHENobarbital (LUMINAL) 64.8 MG tablet Take 1 tablet (64.8 mg total) by mouth 2 (two) times daily. 180 tablet 3  . polyethylene glycol (MIRALAX / GLYCOLAX) packet Take 17 g by mouth daily as needed for moderate constipation. 14 each 0  . pravastatin (PRAVACHOL) 40 MG tablet Take 1 tablet (40 mg total) by mouth every evening. 90 tablet 3  . sacubitril-valsartan (ENTRESTO) 24-26 MG Take 1 tablet by mouth 2 (two) times daily. 60 tablet 3  . SPIRIVA HANDIHALER 18  MCG inhalation capsule INHALE ONE DOSE BY MOUTH ONCE DAILY (Patient taking differently: Place 18 mcg into inhaler and inhale daily. ) 30 capsule 11  . spironolactone (ALDACTONE) 25 MG tablet Take 0.5 tablets (12.5 mg total) by mouth daily. 30 tablet 0  . tamsulosin (FLOMAX) 0.4 MG CAPS capsule Take 1 capsule (0.4 mg total) by mouth daily. 90 capsule 3  . torsemide (DEMADEX) 20 MG tablet Take 1 tablet (20 mg total) by mouth 2 (two) times daily. (Patient taking differently: Take 20 mg by mouth 2 (two) times daily. Check blood pressure before giving) 60 tablet 2  . allopurinol (ZYLOPRIM) 100 MG tablet Take 2 tablets (200 mg total) by mouth daily. 60 tablet 11  . dapagliflozin propanediol (FARXIGA) 10 MG TABS tablet Take 1 tablet (10 mg total) by mouth daily before breakfast. 30 tablet 11  . diclofenac Sodium (VOLTAREN) 1 % GEL Apply 2 g topically daily  as needed (pain). (Patient not taking: Reported on 10/12/2019)     No current facility-administered medications for this encounter.     Allergies  Allergen Reactions  . Calcium Channel Blockers Other (See Comments)    Came to hospital in 1995-caused chest pain   . Entresto [Sacubitril-Valsartan] Swelling    Possible lip swelling per pt's daughter    Social History   Socioeconomic History  . Marital status: Married    Spouse name: Not on file  . Number of children: Not on file  . Years of education: Not on file  . Highest education level: Not on file  Occupational History  . Not on file  Tobacco Use  . Smoking status: Former Smoker    Quit date: 05/04/1986    Years since quitting: 33.4  . Smokeless tobacco: Never Used  Vaping Use  . Vaping Use: Never used  Substance and Sexual Activity  . Alcohol use: No    Alcohol/week: 0.0 standard drinks  . Drug use: No  . Sexual activity: Yes  Other Topics Concern  . Not on file  Social History Narrative  . Not on file   Social Determinants of Health   Financial Resource Strain:   . Difficulty of Paying Living Expenses:   Food Insecurity:   . Worried About Charity fundraiser in the Last Year:   . Arboriculturist in the Last Year:   Transportation Needs:   . Film/video editor (Medical):   Marland Kitchen Lack of Transportation (Non-Medical):   Physical Activity:   . Days of Exercise per Week:   . Minutes of Exercise per Session:   Stress:   . Feeling of Stress :   Social Connections:   . Frequency of Communication with Friends and Family:   . Frequency of Social Gatherings with Friends and Family:   . Attends Religious Services:   . Active Member of Clubs or Organizations:   . Attends Archivist Meetings:   Marland Kitchen Marital Status:   Intimate Partner Violence:   . Fear of Current or Ex-Partner:   . Emotionally Abused:   Marland Kitchen Physically Abused:   . Sexually Abused:     Family History  Problem Relation Age of Onset  .  Heart failure Mother 34  . Heart attack Father 38    PHYSICAL EXAM: Vitals:   10/12/19 1507  BP: 116/82  Pulse: 95  SpO2: 93%   PHYSICAL EXAM: General:  Morbidly obese, Well appearing. No respiratory difficulty HEENT: normal Neck: supple. no JVD. Carotids 2+ bilat; no bruits.  No lymphadenopathy or thyromegaly appreciated. Cor: PMI nondisplaced. Regular rate & rhythm. No rubs, gallops or murmurs. Lungs: clear Abdomen: obese, distended, soft, nontender, nondistended. No hepatosplenomegaly. No bruits or masses. Good bowel sounds. Extremities: no cyanosis, clubbing, rash, 1+ bilateral LEE edema Neuro: alert & oriented x 3, cranial nerves grossly intact. moves all 4 extremities w/o difficulty. Affect pleasant.   ECG:  Not performed today, has known LBBB   ASSESSMENT & PLAN:  1. Acute on Chronic systolic HF - due NICM. LHC 5/21 showed mild nonobstructive CAD and normal CO/CI. - Echo 2017 EF 30-35% - Echo 10/20 EF 30-35% - Echo 4/21 in setting of covid with EF < 20% and severe RV dysfunction - NYHA IIIb. Also limited by obesity and COPD - Volume status elevated. Wt up 11 lb. Abdomen edematous. 1+ bilateral LEE. + DOE, Orthopnea + early satiety. Poor response to titration of home torsemide.  - Refer to remote health for IV Lasix, 80 mg bid x 3 days, then transition back to PO torsemide 40 mg qam/ 20 mg qpm - Check BMP today. [facility] to follow daily.  - Continue spiro 12.5 daily - Continue Entresto 24/26 bid. (seems to be tolerating ok for now, ?  angioedema in the past).  - Continue carvedilol 6.25 bid - Start SGLT2i, Farxiga 10 mg daily  - Suspect he may have LBBB cardiomyopathy but need to exclude PVC mediated CM first. Zio Turned in today. If PVC burden is too low to explain degree of CM, then suspect LBBB and will need referral for CRT-D    2. Chronic hypoxic respiratory failure - quit smoking 1984 - recent COVID PNA - continue home O2  3. Snoring - high suspicion  for OSA, frequent PVCs - Home sleep study has been ordered  4. LBBB - plan as above  5. Obesity - will need weight loss. Body mass index is 40.48 kg/m.   F/u w/ APP to reassess volume status after completion of IV Lasix w/ remote [facility].    Lyda Jester, PA-C  4:58 PM

## 2019-10-13 ENCOUNTER — Telehealth (HOSPITAL_COMMUNITY): Payer: Self-pay | Admitting: *Deleted

## 2019-10-13 ENCOUNTER — Other Ambulatory Visit (HOSPITAL_COMMUNITY): Payer: Self-pay

## 2019-10-13 MED ORDER — DAPAGLIFLOZIN PROPANEDIOL 10 MG PO TABS: 10.0000 mg | ORAL_TABLET | Freq: Every day | ORAL | 3 refills | Status: DC

## 2019-10-13 NOTE — Telephone Encounter (Signed)
Noted  

## 2019-10-13 NOTE — Telephone Encounter (Signed)
Patients wife called and left a message on the triage line requesting that the patients Rx for Wilder Glade be sent in to the [facility] in [place] due to patients insurance not covering it at [employer]. Patients wife was called back and advised that a new rx was sent into the [facility] in [place] for him. She was appreciative

## 2019-10-13 NOTE — Telephone Encounter (Signed)
At [place] 10/12/19 pt brought in his worn Zio monitor, he states he was never given a box to return it to the company and that it had been placed while he was in the hospital. Does not appear our office ordered a Zio and he is not registered on our [employer]. Per note in chart pt was mailed a Zio to place himself back on 08/11/19 per [facility] office. I contacted Ailene Ravel the Zio rep, she pulled patient up, his monitor was never returned to them and was marked as "lost". She states she will ship [place] some empty boxes and all we have to do is put it in the box and ship the monitor to [facility], she will remove the "lost device" note and let the company know it will be mailed back to them next week.  Message sent to [facility] so they are aware the report will be coming to them.

## 2019-10-16 ENCOUNTER — Telehealth: Payer: Self-pay | Admitting: Internal Medicine

## 2019-10-16 NOTE — Telephone Encounter (Signed)
Spoke with Lavella Lemons at [facility] who states they need documentation of why a sleep study is needed prior to patient getting pacemaker. She would also like a copy of the monitor report. I advised that the monitor was just received back at HF clinic on 6/11 and that they had to request a box in order to ship the monitor back to [place]. I explained that those results will take a few weeks. I verified her fax number and advised that I will send the most recent ov note as well as the note that indicates the need for the sleep test; it may not be covered. Lavella Lemons thanked me for my help.

## 2019-10-16 NOTE — Telephone Encounter (Signed)
Tanya from the [facility] called. [name], daughter of the patient called the [facility] and told them that the patient is waiting on the results of the short term monitor test before deciding if the patient needs a pacemaker. The patient was also told that he would need a sleep study done before getting the pacemaker.  The [facility] needs that request in writing and faxed to them before they will approve it. [facility] will as[facility] results of the patient's heart monitor sent to them as well. The fax # for the [facility] is [phone]

## 2019-10-19 ENCOUNTER — Telehealth (HOSPITAL_COMMUNITY): Payer: Self-pay | Admitting: *Deleted

## 2019-10-19 ENCOUNTER — Other Ambulatory Visit (HOSPITAL_COMMUNITY): Payer: No Typology Code available for payment source

## 2019-10-19 NOTE — Telephone Encounter (Signed)
Received a call from Ramah with remote [facility] stating pt needed a 7-10 day office visit. Visit scheduled for 6/25 at 11:30am.  Lattie Haw aware. I called pt no answer will try again later.    Lisa call back # [phone]

## 2019-10-27 ENCOUNTER — Ambulatory Visit (HOSPITAL_COMMUNITY): Payer: No Typology Code available for payment source

## 2019-10-27 ENCOUNTER — Ambulatory Visit (HOSPITAL_COMMUNITY)
Admission: RE | Admit: 2019-10-27 | Discharge: 2019-10-27 | Disposition: A | Discharge status: Home / Self Care | Payer: No Typology Code available for payment source | Source: Ambulatory Visit | Attending: Cardiology | Admitting: Cardiology

## 2019-10-27 ENCOUNTER — Encounter (HOSPITAL_COMMUNITY): Payer: Self-pay

## 2019-10-27 ENCOUNTER — Other Ambulatory Visit: Payer: Self-pay

## 2019-10-27 ENCOUNTER — Other Ambulatory Visit (HOSPITAL_COMMUNITY): Payer: Self-pay | Admitting: Internal Medicine

## 2019-10-27 VITALS — BP 92/70 | HR 85 | Wt 240.0 lb

## 2019-10-27 DIAGNOSIS — E669 Obesity, unspecified: Secondary | ICD-10-CM | POA: Insufficient documentation

## 2019-10-27 DIAGNOSIS — Z6838 Body mass index (BMI) 38.0-38.9, adult: Secondary | ICD-10-CM | POA: Diagnosis not present

## 2019-10-27 DIAGNOSIS — I5042 Chronic combined systolic (congestive) and diastolic (congestive) heart failure: Secondary | ICD-10-CM

## 2019-10-27 DIAGNOSIS — J449 Chronic obstructive pulmonary disease, unspecified: Secondary | ICD-10-CM | POA: Diagnosis not present

## 2019-10-27 DIAGNOSIS — I493 Ventricular premature depolarization: Secondary | ICD-10-CM | POA: Diagnosis not present

## 2019-10-27 DIAGNOSIS — Z8249 Family history of ischemic heart disease and other diseases of the circulatory system: Secondary | ICD-10-CM | POA: Insufficient documentation

## 2019-10-27 DIAGNOSIS — K219 Gastro-esophageal reflux disease without esophagitis: Secondary | ICD-10-CM | POA: Insufficient documentation

## 2019-10-27 DIAGNOSIS — Z79899 Other long term (current) drug therapy: Secondary | ICD-10-CM | POA: Diagnosis not present

## 2019-10-27 DIAGNOSIS — N400 Enlarged prostate without lower urinary tract symptoms: Secondary | ICD-10-CM | POA: Insufficient documentation

## 2019-10-27 DIAGNOSIS — Z7951 Long term (current) use of inhaled steroids: Secondary | ICD-10-CM | POA: Diagnosis not present

## 2019-10-27 DIAGNOSIS — I2510 Atherosclerotic heart disease of native coronary artery without angina pectoris: Secondary | ICD-10-CM | POA: Diagnosis not present

## 2019-10-27 DIAGNOSIS — N183 Chronic kidney disease, stage 3 unspecified: Secondary | ICD-10-CM | POA: Insufficient documentation

## 2019-10-27 DIAGNOSIS — G40909 Epilepsy, unspecified, not intractable, without status epilepticus: Secondary | ICD-10-CM | POA: Insufficient documentation

## 2019-10-27 DIAGNOSIS — J9611 Chronic respiratory failure with hypoxia: Secondary | ICD-10-CM | POA: Insufficient documentation

## 2019-10-27 DIAGNOSIS — Z8616 Personal history of COVID-19: Secondary | ICD-10-CM | POA: Diagnosis not present

## 2019-10-27 DIAGNOSIS — R0683 Snoring: Secondary | ICD-10-CM

## 2019-10-27 DIAGNOSIS — I5023 Acute on chronic systolic (congestive) heart failure: Secondary | ICD-10-CM | POA: Diagnosis not present

## 2019-10-27 DIAGNOSIS — I130 Hypertensive heart and chronic kidney disease with heart failure and stage 1 through stage 4 chronic kidney disease, or unspecified chronic kidney disease: Secondary | ICD-10-CM | POA: Insufficient documentation

## 2019-10-27 DIAGNOSIS — Z7984 Long term (current) use of oral hypoglycemic drugs: Secondary | ICD-10-CM | POA: Insufficient documentation

## 2019-10-27 DIAGNOSIS — I428 Other cardiomyopathies: Secondary | ICD-10-CM | POA: Diagnosis not present

## 2019-10-27 DIAGNOSIS — M109 Gout, unspecified: Secondary | ICD-10-CM | POA: Insufficient documentation

## 2019-10-27 DIAGNOSIS — E785 Hyperlipidemia, unspecified: Secondary | ICD-10-CM | POA: Diagnosis not present

## 2019-10-27 DIAGNOSIS — I447 Left bundle-branch block, unspecified: Secondary | ICD-10-CM | POA: Diagnosis not present

## 2019-10-27 DIAGNOSIS — Z87891 Personal history of nicotine dependence: Secondary | ICD-10-CM | POA: Diagnosis not present

## 2019-10-27 DIAGNOSIS — Z888 Allergy status to other drugs, medicaments and biological substances status: Secondary | ICD-10-CM | POA: Diagnosis not present

## 2019-10-27 DIAGNOSIS — I509 Heart failure, unspecified: Secondary | ICD-10-CM | POA: Diagnosis present

## 2019-10-27 NOTE — Progress Notes (Signed)
Referring: Dr. Sloan Leiter PCP: [facility]:  Dr. Haroldine Laws  EP: Dr Caryl Comes  Reason for Visit: Heart Failure    HPI: Ricky Hayden is a 74 y.o. 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 Caryl Comes for further evaluation of his HF.   He has been followed at the [facility]. Referred to Dr. Caryl Comes in 4/21 for consideration of CRT-D.   He was seen by Dr. Caryl Comes with a televisit on 08/10/19. Based on his note he has had low EF off/on since 2016. In 2016 echo in our system showed EF 45-50%. Follow up echos at [facility]. He had an echo in 10/17 EF 30-35% Last echo 02/2019 was technically difficult, LVEF 30-35%, moderate global HK, diastolic function not well evaluated due to ectopy, mild LVH, moderate LAE, mild aortic regurgitation, mild-moderate mitral regurgitation, mild tricuspid regurgitation, mild aortic root dilatation (4.2cm). Losartanpreviously had to bestopped because of hyperkalemia.  Admitted 4/10 to 08/09/19 with COVID PNA and HF. Echo repeated and EF back down to 20% w/ severe RV dysfunction. He recovered from [place] but unfortunately, his wife died from [place].   On d/c from the hospital was sent home on carvedilol, Entresto, torsemide and spiro. There was a question of lower lip swelling and Entresto was stopped.   Had low BP and a fall and went back to the ER on 4/26. Several meds stopped. Had to restart torsemide due to 4.5 pound weight gain.  He remembers undergoing a cath in 1995 through his groin and being told he had something but it wasn't bad enough to do anything about. Was told that he possibly had a mild heart attack on the back side of his heart. His dad died of an MI at age 33.He drank significant ETOH in  the past but quit 10 years ago. Quit smoking in 1984.   Pt was seen by Dr. Jeffie Pollock 09/05/19 and referred for [facility].R/LHC showed minimal CAD and mild to moderately elevated filling pressures with normal cardiac output. Findings c/w NICM. Also noted to have frequent PVCs. ? PVC induced CM. He was ordered to wear Zio patch to help quantify PVC burden. Also ordered to get a home sleep study to r/o OSA. He has completed Zio monitor.   Followed by [facility] on 10/12/19 for IV lasix. He was ordered to received 80 mg IV lasix x3 then back to torsemide 40 mg /20mg . Weight at that time was 250 pounds.    Today he returns for HF follow up.Overall feeling better. Mild SOB with exertion but says he feels better. Denies PND/Orthopnea. Appetite improved. No fever or chills. Weight at home  239-241  Pounds. SBP 90-100. Taking all medications. He has family members with him 24 hours a day.   ZIO Results will be completed at [facility]-->results pending..     R/LHC Conclusion 09/2019   Prox LAD to Mid LAD lesion is 25% stenosed.  Prox Cx to Dist Cx lesion is 25% stenosed.  Findings: Ao = 98/69 (81) LV = 101/24 RA = 11 RV = 59/15 PA = 64/23 (40) PCW = 19 Fick cardiac output/index = 5.6/2.6 PVR = 3.5 WU  FA sat = 94% PA sat = 64%, 70% Assessment: 1. Minimal CAD 2. Nonischemic CM 3. Mild  to moderately elevated filling pressures with normal cardiac output  2D Echo 4/21 1. Left ventricular ejection fraction, by estimation, is <20%. The left ventricle has severely decreased function. The left ventricle demonstrates global hypokinesis. The left ventricular internal cavity size was mildly dilated. There is mild concentric left ventricular hypertrophy. Left ventricular diastolic function could not be evaluated. Elevated left ventricular enddiastolic pressure. 2. Right ventricular systolic function is severely reduced. The right ventricular size is severely enlarged. There is moderately  elevated pulmonary artery systolic pressure. 3. The mitral valve is normal in structure. Mild mitral valve regurgitation. No evidence of mitral stenosis. 4. The aortic valve is tricuspid. Aortic valve regurgitation is trivial. Mild to moderate aortic valve sclerosis/calcification is present, without any evidence of aortic stenosis. 5. Aortic dilatation noted. There is mild dilatation of the aortic root and of the ascending aorta measuring 41 mm and 77mm respectively. 6. The inferior vena cava is dilated in size with <50% respiratory variability, suggesting right atrial pressure of 15 mmHg. 7. Left atrial size was severely dilated. 8. Right atrial size was mildly dilated.    Past Medical History:  Diagnosis Date  . Arthritis    osteoarthritis of left knee  . Ascending aorta dilatation (HCC)   . Balanitis    recurrent  . Cardiac arrhythmia    life threatening, secondary to CCB vs b- blockers  . Cardiomyopathy ([place])   . Chronic joint pain   . Chronic systolic CHF (congestive heart failure) ([place])   . CKD (chronic kidney disease), stage III   . COPD (chronic obstructive pulmonary disease) ([place])   . Coronary artery disease   . Dilated aortic root ([place])   . Enlarged prostate   . 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 GERD (gastroesophageal reflux disease)   . Gout   . Hiatal hernia   . Hyperlipidemia   . Hypertension   . Intracranial hematoma ([place]) 1995   history of, s/p evacuation by Dr. Sherwood Gambler  . Nocturia   . Obesity   . Pansinusitis    a.  complicated by brain abscess and bleeding requiring craniotomy in 1995.  Marland Kitchen PONV (postoperative nausea and vomiting)   . Sinusitis    s/p ethmoidectomy and nasal septoplasty  . Vertigo    intermitantly    Current Outpatient Medications  Medication Sig Dispense Refill  . albuterol (PROVENTIL HFA;VENTOLIN HFA) 108 (90 BASE) MCG/ACT inhaler Inhale 2 puffs into the lungs every 6 (six) hours as needed for wheezing. 1  Inhaler 11  . allopurinol (ZYLOPRIM) 100 MG tablet Take 2 tablets (200 mg total) by mouth daily. 60 tablet 11  . budesonide-formoterol (SYMBICORT) 80-4.5 MCG/ACT inhaler Inhale 2 puffs into the lungs 2 (two) times daily. 1 Inhaler 12  . carvedilol (COREG) 6.25 MG tablet Take 1 tablet (6.25 mg total) by mouth 2 (two) times daily with a meal. 60 tablet 3  . dapagliflozin propanediol (FARXIGA) 10 MG TABS tablet Take 1 tablet (10 mg total) by mouth daily before breakfast. 90 tablet 3  . diclofenac Sodium (VOLTAREN) 1 % GEL Apply 2 g topically daily as needed (pain).     Marland Kitchen docusate sodium (COLACE) 100 MG capsule Take 1-2 capsules (100-200 mg total) by mouth 2 (two) times daily as needed for mild constipation. 180 capsule 3  . finasteride (PROSCAR) 5 MG tablet TAKE ONE TABLET BY MOUTH ONCE DAILY (Patient taking differently: Take 5 mg by mouth daily. ) 30 tablet 11  . Multiple Vitamins-Minerals (ICAPS  AREDS 2 PO) Take 2 tablets by mouth daily.    Marland Kitchen omeprazole (PRILOSEC) 20 MG capsule Take 1 capsule (20 mg total) by mouth daily. 30 capsule 11  . PHENobarbital (LUMINAL) 64.8 MG tablet Take 1 tablet (64.8 mg total) by mouth 2 (two) times daily. 180 tablet 3  . polyethylene glycol (MIRALAX / GLYCOLAX) packet Take 17 g by mouth daily as needed for moderate constipation. 14 each 0  . pravastatin (PRAVACHOL) 40 MG tablet Take 1 tablet (40 mg total) by mouth every evening. 90 tablet 3  . sacubitril-valsartan (ENTRESTO) 24-26 MG Take 1 tablet by mouth 2 (two) times daily. 60 tablet 3  . SPIRIVA HANDIHALER 18 MCG inhalation capsule INHALE ONE DOSE BY MOUTH ONCE DAILY (Patient taking differently: Place 18 mcg into inhaler and inhale daily. ) 30 capsule 11  . spironolactone (ALDACTONE) 25 MG tablet Take 0.5 tablets (12.5 mg total) by mouth daily. 30 tablet 0  . tamsulosin (FLOMAX) 0.4 MG CAPS capsule Take 1 capsule (0.4 mg total) by mouth daily. 90 capsule 3  . torsemide (DEMADEX) 20 MG tablet Take 1 tablet (20 mg  total) by mouth 2 (two) times daily. (Patient taking differently: Take 20 mg by mouth 2 (two) times daily. Check blood pressure before giving) 60 tablet 2   No current facility-administered medications for this encounter.     Allergies  Allergen Reactions  . Calcium Channel Blockers Other (See Comments)    Came to hospital in 1995-caused chest pain   . Entresto [Sacubitril-Valsartan] Swelling    Possible lip swelling per pt's daughter    Social History   Socioeconomic History  . Marital status: Married    Spouse name: Not on file  . Number of children: Not on file  . Years of education: Not on file  . Highest education level: Not on file  Occupational History  . Not on file  Tobacco Use  . Smoking status: Former Smoker    Quit date: 05/04/1986    Years since quitting: 33.5  . Smokeless tobacco: Never Used  Vaping Use  . Vaping Use: Never used  Substance and Sexual Activity  . Alcohol use: No    Alcohol/week: 0.0 standard drinks  . Drug use: No  . Sexual activity: Yes  Other Topics Concern  . Not on file  Social History Narrative  . Not on file   Social Determinants of Health   Financial Resource Strain:   . Difficulty of Paying Living Expenses:   Food Insecurity:   . Worried About Charity fundraiser in the Last Year:   . Arboriculturist in the Last Year:   Transportation Needs:   . Film/video editor (Medical):   Marland Kitchen Lack of Transportation (Non-Medical):   Physical Activity:   . Days of Exercise per Week:   . Minutes of Exercise per Session:   Stress:   . Feeling of Stress :   Social Connections:   . Frequency of Communication with Friends and Family:   . Frequency of Social Gatherings with Friends and Family:   . Attends Religious Services:   . Active Member of Clubs or Organizations:   . Attends Archivist Meetings:   Marland Kitchen Marital Status:   Intimate Partner Violence:   . Fear of Current or Ex-Partner:   . Emotionally Abused:   Marland Kitchen Physically  Abused:   . Sexually Abused:     Family History  Problem Relation Age of Onset  . Heart failure  Mother 59  . Heart attack Father 54    PHYSICAL EXAM: Vitals:   10/27/19 1132  BP: 92/70  Pulse: 85  SpO2: 99%   Wt Readings from Last 3 Encounters:  10/27/19 108.9 kg (240 lb)  10/12/19 113.8 kg (250 lb 12.8 oz)  09/20/19 109.8 kg (242 lb)   General: Walked in the clinic with a rolling walker. No resp difficulty. Son present.  HEENT: normal Neck: supple. JVP 7-8  Carotids 2+ bilat; no bruits. No lymphadenopathy or thryomegaly appreciated. Cor: PMI nondisplaced. Regular rate & rhythm. No rubs, gallops or murmurs. Lungs: EW on 2 liters  Abdomen: soft, nontender, nondistended. No hepatosplenomegaly. No bruits or masses. Good bowel sounds. Extremities: no cyanosis, clubbing, rash, R and LLE trace edema.  Neuro: alert & orientedx3, cranial nerves grossly intact. moves all 4 extremities w/o difficulty. Affect pleasant   ASSESSMENT & PLAN:  1. Acute on Chronic systolic HF - due NICM. LHC 5/21 showed mild nonobstructive CAD and normal CO/CI. - Echo 2017 EF 30-35% - Echo 10/20 EF 30-35% - Echo 4/21 in setting of covid with EF < 20% and severe RV dysfunction . Also limited by obesity and COPD - NYHA III but has had functional improvement now that fluid status has improved.  - No room to up titrate meds with soft BP.  - Volume status stable. Continue current diuretic regime. Would like to keep protected weight 240-243 pounds.  - Continue spiro 12.5 daily - Continue Entresto 24/26 bid. (seems to be tolerating ok for now, ?  angioedema in the past).  - Continue carvedilol 6.25 bid  - Continue  SGLT2i, Farxiga 10 mg daily  - Suspect he may have LBBB cardiomyopathy ? Possible PVC mediated. Needs to get back to Dr Caryl Comes.  Elwyn Reach returned to [facility]. -- results pending.  Refer to back to Dr Caryl Comes.    2. Chronic hypoxic respiratory failure - quit smoking 1984 - recent COVID PNA  - continue  home O2  3. Snoring - high suspicion for OSA, frequent PVCs - Home sleep study has been ordered today.   4. LBBB - plan as above  5. Obesity - will need weight loss. Body mass index is 38.74 kg/m.   6. COPD   Wheezing on exam. Has inhalers at home will continue.   Dr Caryl Comes for possible CRT-D  Follow up in July in HF clininc.    Darrick Grinder, NP  11:37 AM

## 2019-10-27 NOTE — Patient Instructions (Addendum)
It was great to see you today! No medication changes are needed at this time.  Your provider has recommended that you have a home sleep study.  [employer] is the company that does these test.  They will contact you by phone and must speak with you before they can ship the equipment.  Once they have spoken with you they will send the equipment right to your home with instructions on how to set it up.  Once you have completed the test simply box all the equipment back up and mail back to the company.  IF you have any questions or issues with the equipment please call the company directly at [phone].  If your test is positive for sleep apnea and you need a home CPAP machine you will be contacted by Dr Theodosia Blender office [facility]) to set this up.   You have been referred back to [facility] with Dr Caryl Comes [address] [place] [address] [phone] -they will be in contact with an appointment

## 2019-10-27 NOTE — Progress Notes (Signed)
Patient Name: Ricky Hayden        DOB: December 04, 1945      Height:  5\' 6"     Weight: 240  Office Name:[facility]         Referring Provider:  Today's Date:  Date:   STOP BANG RISK ASSESSMENT S (snore) Have you been told that you snore?     YES   T (tired) Are you often tired, fatigued, or sleepy during the day?   NO  O (obstruction) Do you stop breathing, choke, or gasp during sleep? /NO   P (pressure) Do you have or are you being treated for high blood pressure? YES   B (BMI) Is your body index greater than 35 kg/m? YES   A (age) Are you 31 years old or older? YES   N (neck) Do you have a neck circumference greater than 16 inches?   YES   G (gender) Are you a male? YES   TOTAL STOP/BANG "YES" ANSWERS                                                                        For Office Use Only              Procedure Order Form    YES to 3+ Stop Bang questions OR two clinical symptoms - patient qualifies for [employer] (CPT 95800)     Submit: This Form + Patient Face Sheet + Clinical Note via [employer] or Fax: [phone]         Clinical Notes: Will consult Sleep Specialist and refer for management of therapy due to patient increased risk of Sleep Apnea. Ordering a sleep study due to the following two clinical symptoms: Loud snoring R06.83 /  History of high blood pressure R03.0 /   I understand that I am proceeding with a home sleep apnea test as ordered by my treating physician. I understand that untreated sleep apnea is a serious cardiovascular risk factor and it is my responsibility to perform the test and seek management for sleep apnea. I will be contacted with the results and be managed for sleep apnea by a local sleep physician. I will be receiving equipment and further instructions from [facility]. I shall promptly ship back the equipment via the included mailing label. I understand my insurance will be billed for the test and as the patient I am  responsible for any insurance related out-of-pocket costs incurred. I have been provided with written instructions and can call for additional video or telephonic instruction, with 24-hour availability of qualified personnel to answer any questions: Patient Help Desk [phone].  Patient Signature ______________________________________________________   Date______________________ Patient Telemedicine Verbal Consent

## 2019-11-08 ENCOUNTER — Telehealth (HOSPITAL_COMMUNITY): Payer: Self-pay | Admitting: *Deleted

## 2019-11-08 ENCOUNTER — Other Ambulatory Visit (HOSPITAL_COMMUNITY): Payer: Self-pay

## 2019-11-08 MED ORDER — DAPAGLIFLOZIN PROPANEDIOL 10 MG PO TABS: 10.0000 mg | ORAL_TABLET | Freq: Every day | ORAL | 3 refills | Status: DC

## 2019-11-08 NOTE — Telephone Encounter (Signed)
Script for [facility] faxed to [facility] hospital at [phone] as requested.

## 2019-11-09 ENCOUNTER — Other Ambulatory Visit (HOSPITAL_COMMUNITY): Payer: Self-pay

## 2019-11-09 MED ORDER — EMPAGLIFLOZIN 10 MG PO TABS
10.0000 mg | ORAL_TABLET | Freq: Every day | ORAL | 3 refills | Status: DC
Start: 2019-11-09 — End: 2020-05-21

## 2019-11-09 NOTE — Telephone Encounter (Signed)
[facility] did not cover [place] suggested alternative was Jardiance 10mg  QD per Audry Riles, Pharmacist,new Rx faxed to [facility] for Jardiance  Fax# [phone]

## 2019-11-10 ENCOUNTER — Other Ambulatory Visit (HOSPITAL_COMMUNITY): Payer: Self-pay | Admitting: *Deleted

## 2019-11-23 ENCOUNTER — Other Ambulatory Visit: Payer: Self-pay

## 2019-11-23 ENCOUNTER — Ambulatory Visit (INDEPENDENT_AMBULATORY_CARE_PROVIDER_SITE_OTHER): Payer: No Typology Code available for payment source | Admitting: Physician Assistant

## 2019-11-23 ENCOUNTER — Encounter: Payer: Self-pay | Admitting: Physician Assistant

## 2019-11-23 VITALS — BP 88/68 | HR 76 | Ht 66.0 in | Wt 234.0 lb

## 2019-11-23 DIAGNOSIS — I428 Other cardiomyopathies: Secondary | ICD-10-CM | POA: Diagnosis not present

## 2019-11-23 DIAGNOSIS — I493 Ventricular premature depolarization: Secondary | ICD-10-CM | POA: Diagnosis not present

## 2019-11-23 DIAGNOSIS — I5022 Chronic systolic (congestive) heart failure: Secondary | ICD-10-CM | POA: Diagnosis not present

## 2019-11-23 NOTE — Patient Instructions (Signed)
Medication Instructions:  Your physician recommends that you continue on your current medications as directed. Please refer to the Current Medication list given to you today.  *If you need a refill on your cardiac medications before your next appointment, please call your pharmacy*   Lab Work: [place]   If you have labs (blood work) drawn today and your tests are completely normal, you will receive your results only by: Marland Kitchen MyChart Message (if you have MyChart) OR . A paper copy in the mail If you have any lab test that is abnormal or we need to change your treatment, we will call you to review the results.   Testing/Procedures: NONE ORDERED  TODAY   Follow-Up: At [facility], you and your health needs are our priority.  As part of our continuing mission to provide you with exceptional heart care, we have created designated Provider Care Teams.  These Care Teams include your primary Cardiologist (physician) and Advanced Practice Providers (APPs -  Physician Assistants and Nurse Practitioners) who all work together to provide you with the care you need, when you need it.  We recommend signing up for the patient portal called "MyChart".  Sign up information is provided on this After Visit Summary.  MyChart is used to connect with patients for Virtual Visits (Telemedicine).  Patients are able to view lab/test results, encounter notes, upcoming appointments, etc.  Non-urgent messages can be sent to your provider as well.   To learn more about what you can do with MyChart, go to [email or web address].    Your next appointment:   2-3  week(s)  The format for your next appointment:   Virtual Visit   Provider:   You may see Virl Axe, MD or one of the following Advanced Practice Providers on your designated Care Team:    Chanetta Marshall, NP  Tommye Standard, [place]  Legrand Como "Oda Kilts, [place]    Other Instructions

## 2019-11-23 NOTE — Progress Notes (Signed)
Cardiology Office Note Date:  11/23/2019  Patient ID:  Ricky Hayden, DOB [birth year], MRN [id number] PCP:  [facility]  Cardiologist:  [facility]: Dr. Haroldine Laws EP: Dr. Caryl Comes    Chief Complaint:  PVCs  History of Present Illness: Ricky Hayden is a 74 y.o. 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 Caryl Comes.  Last seen by him ( and his 1st visit) via tele health visit 08/10/19 for consideration for CRT, though he felt there was more clinically that needed optimizing and was referred to HF team. He did not PVCs on his EKG, though in review of older EKGs had none.  COVID hospitalization April  (unfortunately also his wife who died), TTE at that time EF back down to 20% w/ severe RV dysfunction, discharged on entresto though developed some concern of lip swelling and was stopped.  He is on home O2  He has been observed to have ongoing frequent PVCs, planned for Zio monitor.  Question if his CM 2/2 LBBB, perhaps PVCs Limitations are described as his HF though as well as his COPD and obesity He is back to EP to follow up on these.  13 day Zio (pending MD) PVCs, < 1%  The patient comes today accompanied by his daughter Ricky Hayden. He ambulates with a roling walker and wears )2 chronically. He tells me that in comparison to 2 mo ago he feels markedly better. W ith his walker feels like he is able to get around his house, gets in the back yard some and walks around, and not overtly SOB.  With anything much more then that he will need to stop and settle his breathing. He sleeps with O2 now and sleeps very well, no symptoms of PND or orthopnea. No CP, palpitations or cardiac awareness No near syncope or syncope.   He has had a couple falls, but not since his last visit with the HF team.   Past  Medical History:  Diagnosis Date  . Arthritis    osteoarthritis of left knee  . Ascending aorta dilatation (HCC)   . Balanitis    recurrent  . Cardiac arrhythmia    life threatening, secondary to CCB vs b- blockers  . Cardiomyopathy ([place])   . Chronic joint pain   . Chronic systolic CHF (congestive heart failure) ([place])   . CKD (chronic kidney disease), stage III   . COPD (chronic obstructive pulmonary disease) ([place])   . Coronary artery disease   . Dilated aortic root ([place])   . Enlarged prostate   . 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 GERD (gastroesophageal reflux disease)   . Gout   . Hiatal hernia   . Hyperlipidemia   . Hypertension   . Intracranial hematoma ([place]) 1995   history of, s/p evacuation by Dr. Sherwood Gambler  . Nocturia   . Obesity   . Pansinusitis    a.  complicated by brain abscess and bleeding requiring craniotomy in 1995.  Ricky Hayden PONV (postoperative nausea and vomiting)   . Sinusitis    s/p ethmoidectomy and nasal septoplasty  . Vertigo    intermitantly    Past Surgical History:  Procedure Laterality Date  . CIRCUMCISION N/A 11/20/2013   Procedure: CIRCUMCISION ADULT;  Surgeon: Claybon Jabs, MD;  Location[facility];  Service: Urology;  Laterality:  N/A;  . [place]   hematomy due to sinus infection   . RIGHT/LEFT HEART CATH AND CORONARY ANGIOGRAPHY N/A 09/20/2019   Procedure: RIGHT/LEFT HEART CATH AND CORONARY ANGIOGRAPHY;  Surgeon: Jolaine Artist, MD;  Location: [facility];  Service: Cardiovascular;  Laterality: N/A;  . shoulder surg rt   1995  . SINUS SURGERY WITH INSTATRAK     ethmoidectomy and nasal septum repair    Current Outpatient Medications  Medication Sig Dispense Refill  . albuterol (PROVENTIL HFA;VENTOLIN HFA) 108 (90 BASE) MCG/ACT inhaler Inhale 2 puffs into the lungs every 6 (six) hours as needed for wheezing. 1 Inhaler 11  . allopurinol (ZYLOPRIM) 100 MG tablet Take 2 tablets (200 mg total) by mouth  daily. 60 tablet 11  . budesonide-formoterol (SYMBICORT) 80-4.5 MCG/ACT inhaler Inhale 2 puffs into the lungs 2 (two) times daily. 1 Inhaler 12  . carvedilol (COREG) 6.25 MG tablet Take 1 tablet (6.25 mg total) by mouth 2 (two) times daily with a meal. 60 tablet 3  . diclofenac Sodium (VOLTAREN) 1 % GEL Apply 2 g topically daily as needed (pain).     Ricky Hayden docusate sodium (COLACE) 100 MG capsule Take 1-2 capsules (100-200 mg total) by mouth 2 (two) times daily as needed for mild constipation. 180 capsule 3  . empagliflozin (JARDIANCE) 10 MG TABS tablet Take 1 tablet (10 mg total) by mouth daily before breakfast. 90 tablet 3  . finasteride (PROSCAR) 5 MG tablet TAKE ONE TABLET BY MOUTH ONCE DAILY (Patient taking differently: Take 5 mg by mouth daily. ) 30 tablet 11  . Multiple Vitamins-Minerals (ICAPS AREDS 2 PO) Take 2 tablets by mouth daily.    Ricky Hayden omeprazole (PRILOSEC) 20 MG capsule Take 1 capsule (20 mg total) by mouth daily. 30 capsule 11  . PHENobarbital (LUMINAL) 64.8 MG tablet Take 1 tablet (64.8 mg total) by mouth 2 (two) times daily. 180 tablet 3  . polyethylene glycol (MIRALAX / GLYCOLAX) packet Take 17 g by mouth daily as needed for moderate constipation. 14 each 0  . pravastatin (PRAVACHOL) 40 MG tablet Take 1 tablet (40 mg total) by mouth every evening. 90 tablet 3  . sacubitril-valsartan (ENTRESTO) 24-26 MG Take 1 tablet by mouth 2 (two) times daily. 60 tablet 3  . SPIRIVA HANDIHALER 18 MCG inhalation capsule INHALE ONE DOSE BY MOUTH ONCE DAILY (Patient taking differently: Place 18 mcg into inhaler and inhale daily. ) 30 capsule 11  . spironolactone (ALDACTONE) 25 MG tablet Take 0.5 tablets (12.5 mg total) by mouth daily. 30 tablet 0  . tamsulosin (FLOMAX) 0.4 MG CAPS capsule Take 1 capsule (0.4 mg total) by mouth daily. 90 capsule 3  . torsemide (DEMADEX) 20 MG tablet Take 1 tablet (20 mg total) by mouth 2 (two) times daily. (Patient taking differently: Take 20 mg by mouth 2 (two) times  daily. Check blood pressure before giving) 60 tablet 2   No current facility-administered medications for this visit.    Allergies:   Calcium channel blockers and Entresto [sacubitril-valsartan]   Social History:  The patient  reports that he quit smoking about 33 years ago. He has never used smokeless tobacco. He reports that he does not drink alcohol and does not use drugs.   Family History:  The patient's family history includes Heart attack (age of onset: 26) in his father; Heart failure (age of onset: 57) in his mother.  ROS:  Please see the history of present illness.  All other systems are  reviewed and otherwise negative.   PHYSICAL EXAM:  VS:  BP (!) 88/68   Pulse 76   Ht 5\' 6"  (1.676 m)   Wt (!) 234 lb (106.1 kg)   SpO2 98%   BMI 37.77 kg/m  BMI: Body mass index is 37.77 kg/m.  A recheck on fhi sBP by myself 66/62 Well nourished, well developed, in no acute distress  HEENT: normocephalic, atraumatic  Neck: no JVD, carotid bruits or masses Cardiac:  RRR; no significant murmurs, no rubs, or gallops Lungs:  CTA b/l, no wheezing, rhonchi or rales  Abd: soft, nontender MS: no deformity or atrophy Ext: 1+ edema b/l to just above mid-shin, he has a small wound LLE at the crease of his foot/ankle, dry and appears to be healing.  No erythema or drainage Skin: warm and dry, no rash Neuro:  No gross deficits appreciated Psych: euthymic mood, full affect   EKG:  Not done today   May 2021: R/LHC Prox LAD to Mid LAD lesion is 25% stenosed.  Prox Cx to Dist Cx lesion is 25% stenosed. Findings: Ao = 98/69 (81) LV = 101/24 RA = 11 RV = 59/15 PA = 64/23 (40) PCW = 19 Fick cardiac output/index = 5.6/2.6 PVR = 3.5 WU  FA sat = 94% PA sat = 64%, 70% Assessment: 1. Minimal CAD 2. Nonischemic CM 3. Mild to moderately elevated filling pressures with normal cardiac output   2D Echo 4/21 1. Left ventricular ejection fraction, by estimation, is <20%. The left ventricle  has severely decreased function. The left ventricle demonstrates global hypokinesis. The left ventricular internal cavity size was mildly dilated. There is mild concentric left ventricular hypertrophy. Left ventricular diastolic function could not be evaluated. Elevated left ventricular enddiastolic pressure. 2. Right ventricular systolic function is severely reduced. The right ventricular size is severely enlarged. There is moderately elevated pulmonary artery systolic pressure. 3. The mitral valve is normal in structure. Mild mitral valve regurgitation. No evidence of mitral stenosis. 4. The aortic valve is tricuspid. Aortic valve regurgitation is trivial. Mild to moderate aortic valve sclerosis/calcification is present, without any evidence of aortic stenosis. 5. Aortic dilatation noted. There is mild dilatation of the aortic root and of the ascending aorta measuring 41 mm and 74mm respectively. 6. The inferior vena cava is dilated in size with <50% respiratory variability, suggesting right atrial pressure of 15 mmHg. 7. Left atrial size was severely dilated. 8. Right atrial size was mildly dilated.   Recent Labs: 08/17/2019: Magnesium 2.1 09/06/2019: ALT 19; Platelets 174 09/20/2019: Hemoglobin 13.6 10/12/2019: B Natriuretic Peptide 1,156.6; BUN 30; Creatinine, Ser 1.28; Potassium 4.5; Sodium 142  08/15/2019: Cholesterol 134; HDL 35; LDL Cholesterol 77; Total CHOL/HDL Ratio 3.8; Triglycerides 110; VLDL 22   CrCl cannot be calculated (Patient's most recent lab result is older than the maximum 21 days allowed.).   Wt Readings from Last 3 Encounters:  11/23/19 (!) 234 lb (106.1 kg)  10/27/19 240 lb (108.9 kg)  10/12/19 250 lb 12.8 oz (113.8 kg)     Other studies reviewed: Additional studies/records reviewed today include: summarized above  ASSESSMENT AND PLAN:  1. PVCs     Burden is low, and not felt to contribute to his CM  2. NICM 3. Chronic CHF     Weight is down and he is  feeling better     Limitations are multifactorial as noted by HF team  Home BPs reported are good, 90's-110's/60's-80's      I discussed at length  today with the patient and daughter what if any role an ICD would play, as well as CRT. I discussed that I would talk with Dr. Caryl Comes, though suspect that we would plan for another echo, post COVID recovery and on his medical therapy. They had questions about the device implant with the patient having worries about that (knowing a friend who died shortly after his implant), and we discussed this as well.  I had opportunity at the end of the day to review the case with Dr. Caryl Comes.  Agrees in review of his monitor PVC burden is low. Discussed NICM and weather there would be a role for ICD or not.  Though CRT may be beneficial. His assumption is that the patient would be getting an echo when he sees Dr. Haroldine Laws and this would help drive these decisions. He would like to see the patient when he gets back from vacation, in the clinic and not virtually,.  I have called the patient and spoken to his daughter who accompanied him today and updated her on my conversation with Dr. Caryl Comes. I will message our scheduler to call her to reschedule follow up with Dr. Caryl Comes    Disposition: F/u as above  Current medicines are reviewed at length with the patient today.  The patient did not have any concerns regarding medicines.  Venetia Night, PA-C 11/23/2019 6:21 PM     [place]  [phone] (office)  [phone] (fax)

## 2019-11-28 ENCOUNTER — Encounter (HOSPITAL_COMMUNITY): Payer: No Typology Code available for payment source

## 2019-11-29 ENCOUNTER — Other Ambulatory Visit (HOSPITAL_COMMUNITY): Payer: Self-pay | Admitting: Internal Medicine

## 2019-12-07 ENCOUNTER — Other Ambulatory Visit (HOSPITAL_COMMUNITY): Payer: Self-pay

## 2019-12-07 DIAGNOSIS — I5022 Chronic systolic (congestive) heart failure: Secondary | ICD-10-CM

## 2019-12-07 NOTE — Progress Notes (Signed)
Orders Placed This Encounter  Procedures  . ECHOCARDIOGRAM COMPLETE    Standing Status:   Future    Standing Expiration Date:   12/06/2020    Order Specific Question:   Where should this test be performed    Answer:   [place]    Order Specific Question:   Perflutren DEFINITY (image enhancing agent) should be administered unless hypersensitivity or allergy exist    Answer:   Administer Perflutren    Order Specific Question:   Reason for exam-Echo    Answer:   Congestive Heart Failure  428.0 / I50.9    Order Specific Question:   Release to patient    Answer:   Immediate    

## 2019-12-11 ENCOUNTER — Ambulatory Visit (HOSPITAL_COMMUNITY)
Admission: RE | Admit: 2019-12-11 | Discharge: 2019-12-11 | Disposition: A | Discharge status: Home / Self Care | Payer: No Typology Code available for payment source | Source: Ambulatory Visit | Attending: Cardiology | Admitting: Cardiology

## 2019-12-11 ENCOUNTER — Telehealth (HOSPITAL_COMMUNITY): Payer: Self-pay | Admitting: Internal Medicine

## 2019-12-11 ENCOUNTER — Other Ambulatory Visit: Payer: Self-pay

## 2019-12-11 DIAGNOSIS — I77810 Thoracic aortic ectasia: Secondary | ICD-10-CM | POA: Insufficient documentation

## 2019-12-11 DIAGNOSIS — I428 Other cardiomyopathies: Secondary | ICD-10-CM | POA: Insufficient documentation

## 2019-12-11 DIAGNOSIS — E785 Hyperlipidemia, unspecified: Secondary | ICD-10-CM | POA: Insufficient documentation

## 2019-12-11 DIAGNOSIS — Z87891 Personal history of nicotine dependence: Secondary | ICD-10-CM | POA: Insufficient documentation

## 2019-12-11 DIAGNOSIS — I700 Atherosclerosis of aorta: Secondary | ICD-10-CM | POA: Diagnosis not present

## 2019-12-11 DIAGNOSIS — I110 Hypertensive heart disease with heart failure: Secondary | ICD-10-CM | POA: Diagnosis not present

## 2019-12-11 DIAGNOSIS — I5022 Chronic systolic (congestive) heart failure: Secondary | ICD-10-CM | POA: Diagnosis present

## 2019-12-11 DIAGNOSIS — I493 Ventricular premature depolarization: Secondary | ICD-10-CM | POA: Insufficient documentation

## 2019-12-11 DIAGNOSIS — J449 Chronic obstructive pulmonary disease, unspecified: Secondary | ICD-10-CM | POA: Diagnosis not present

## 2019-12-11 DIAGNOSIS — I509 Heart failure, unspecified: Secondary | ICD-10-CM | POA: Insufficient documentation

## 2019-12-11 DIAGNOSIS — I2510 Atherosclerotic heart disease of native coronary artery without angina pectoris: Secondary | ICD-10-CM | POA: Diagnosis not present

## 2019-12-11 MED ORDER — TORSEMIDE 20 MG PO TABS: 20.0000 mg | ORAL_TABLET | Freq: Two times a day (BID) | ORAL | 6 refills | Status: DC

## 2019-12-11 MED ORDER — PERFLUTREN LIPID MICROSPHERE
1.0000 mL | INTRAVENOUS | Status: AC | PRN
  Administered 2019-12-11: 3 mL via INTRAVENOUS
  Filled 2019-12-11: qty 10

## 2019-12-11 MED ORDER — TORSEMIDE 20 MG PO TABS: 20.0000 mg | ORAL_TABLET | Freq: Two times a day (BID) | ORAL | 0 refills | Status: DC

## 2019-12-11 NOTE — Telephone Encounter (Signed)
Patient request Torsemide refill, send  script to [facility], but would like a week supply to go to [facility] until it's delivered, pt will be out of meds Friday. Thanks

## 2019-12-11 NOTE — Telephone Encounter (Signed)
1 wk supply sent to [facility], 30 day supply w/refills sent to [facility], pt is aware

## 2019-12-11 NOTE — Progress Notes (Signed)
  Echocardiogram 2D Echocardiogram has been performed.  Ricky Hayden 12/11/2019, 4:57 PM

## 2019-12-12 ENCOUNTER — Encounter: Payer: Self-pay | Admitting: Internal Medicine

## 2019-12-12 ENCOUNTER — Ambulatory Visit (INDEPENDENT_AMBULATORY_CARE_PROVIDER_SITE_OTHER): Payer: No Typology Code available for payment source | Admitting: Internal Medicine

## 2019-12-12 DIAGNOSIS — I428 Other cardiomyopathies: Secondary | ICD-10-CM | POA: Diagnosis not present

## 2019-12-12 DIAGNOSIS — I509 Heart failure, unspecified: Secondary | ICD-10-CM

## 2019-12-12 DIAGNOSIS — I493 Ventricular premature depolarization: Secondary | ICD-10-CM | POA: Diagnosis not present

## 2019-12-12 LAB — ECHOCARDIOGRAM COMPLETE
Area-P 1/2: 3.91 cm2
S' Lateral: 5.5 cm

## 2019-12-12 MED ORDER — TORSEMIDE 20 MG PO TABS: ORAL_TABLET | ORAL | 3 refills | Status: DC

## 2019-12-12 NOTE — Progress Notes (Signed)
Patient Care Team: [facility] as PCP - General Deboraha Sprang, MD as PCP - Electrophysiology (Cardiology) Maryellen Pile, MD (Inactive) as Resident (Internal Medicine)   HPI  Ricky Hayden is a 74 y.o. male seen in followup for consideration of CRT referred from the [facility]   Cardiac consultation note 08/03/2019 describes a low ejection fraction heart failure on and off since 2016.  Denies coronary disease or bypass surgery.  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  Date Cr K Hgb  9//20 (scanned)   1.37 4.2 14.5          DATE TEST EF   10/17 Echo  30-35%   10/20 Echo   30-35 %   4/21 Echo  20% RV dysfn severe  5/21 LHC  Cors min obstruction  8/21 Echo  20-25% RV function normal    ZIO  PVCs < 1%  He is much improved following aggressive therapy from the heart failure service.  Ongoing shortness of breath.  Mild edema.  Wears oxygen. Records and Results Reviewed   Past Medical History:  Diagnosis Date  . Arthritis    osteoarthritis of left knee  . Ascending aorta dilatation (HCC)   . Balanitis    recurrent  . Cardiac arrhythmia    life threatening, secondary to CCB vs b- blockers  . Cardiomyopathy ([place])   . Chronic joint pain   . Chronic systolic CHF (congestive heart failure) ([place])   . CKD (chronic kidney disease), stage III   . COPD (chronic obstructive pulmonary disease) ([place])   . Coronary artery disease   . Dilated aortic root ([place])   . Enlarged prostate   . 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 GERD (gastroesophageal reflux disease)   . Gout   . Hiatal hernia   . Hyperlipidemia   . Hypertension   . Intracranial hematoma ([place]) 1995   history of, s/p evacuation by Dr. Sherwood Gambler  . Nocturia   . Obesity   . Pansinusitis    a.  complicated by brain abscess  and bleeding requiring craniotomy in 1995.  Marland Kitchen PONV (postoperative nausea and vomiting)   . Sinusitis    s/p ethmoidectomy and nasal septoplasty  . Vertigo    intermitantly    Past Surgical History:  Procedure Laterality Date  . CIRCUMCISION N/A 11/20/2013   Procedure: CIRCUMCISION ADULT;  Surgeon: Claybon Jabs, MD;  [facility];  Service: Urology;  Laterality: N/A;  . [place]   hematomy due to sinus infection   . RIGHT/LEFT HEART CATH AND CORONARY ANGIOGRAPHY N/A 09/20/2019   Procedure: RIGHT/LEFT HEART CATH AND CORONARY ANGIOGRAPHY;  Surgeon: Jolaine Artist, MD;  Location: [facility];  Service: Cardiovascular;  Laterality: N/A;  . shoulder surg rt   1995  . SINUS SURGERY WITH INSTATRAK     ethmoidectomy and nasal septum repair    Current Meds  Medication Sig  . albuterol (PROVENTIL HFA;VENTOLIN HFA) 108 (90 BASE) MCG/ACT inhaler Inhale 2 puffs into the lungs every 6 (six) hours as needed for wheezing.  . budesonide-formoterol (SYMBICORT) 80-4.5 MCG/ACT inhaler Inhale 2 puffs into the lungs 2 (two) times daily.  . carvedilol (COREG) 6.25 MG tablet Take 1 tablet (6.25 mg total) by mouth 2 (two) times daily with a meal.  .  diclofenac Sodium (VOLTAREN) 1 % GEL Apply 2 g topically daily as needed (pain).   Marland Kitchen docusate sodium (COLACE) 100 MG capsule Take 1-2 capsules (100-200 mg total) by mouth 2 (two) times daily as needed for mild constipation.  . empagliflozin (JARDIANCE) 10 MG TABS tablet Take 1 tablet (10 mg total) by mouth daily before breakfast.  . finasteride (PROSCAR) 5 MG tablet TAKE ONE TABLET BY MOUTH ONCE DAILY (Patient taking differently: Take 5 mg by mouth daily. )  . Multiple Vitamins-Minerals (ICAPS AREDS 2 PO) Take 2 tablets by mouth daily.  Marland Kitchen omeprazole (PRILOSEC) 20 MG capsule Take 1 capsule (20 mg total) by mouth daily.  Marland Kitchen PHENobarbital (LUMINAL) 64.8 MG tablet Take 1 tablet (64.8 mg total) by mouth 2 (two) times daily.  . polyethylene glycol  (MIRALAX / GLYCOLAX) packet Take 17 g by mouth daily as needed for moderate constipation.  . pravastatin (PRAVACHOL) 40 MG tablet Take 1 tablet (40 mg total) by mouth every evening.  . sacubitril-valsartan (ENTRESTO) 24-26 MG Take 1 tablet by mouth 2 (two) times daily.  Marland Kitchen SPIRIVA HANDIHALER 18 MCG inhalation capsule INHALE ONE DOSE BY MOUTH ONCE DAILY (Patient taking differently: Place 18 mcg into inhaler and inhale daily. )  . spironolactone (ALDACTONE) 25 MG tablet Take 0.5 tablets (12.5 mg total) by mouth daily.  . tamsulosin (FLOMAX) 0.4 MG CAPS capsule Take 1 capsule (0.4 mg total) by mouth daily.  Marland Kitchen torsemide (DEMADEX) 20 MG tablet Take 20 mg by mouth. Take two tablets in the am and one tablet in the pm.    Allergies  Allergen Reactions  . Calcium Channel Blockers Other (See Comments)    Came to hospital in 1995-caused chest pain   . Entresto [Sacubitril-Valsartan] Swelling    Possible lip swelling per pt's daughter      Review of Systems negative except from HPI and PMH  Physical Exam BP 122/68   Pulse 77   Ht 5\' 6"  (1.676 m)   Wt 233 lb (105.7 kg)   SpO2 99%   BMI 37.61 kg/m  Well developed and well nourished in no acute distress wearing O2 HENT normal E scleral and icterus clear Neck Supple JVP flat; carotids brisk and full Clear to ausculation  Regular rate and rhythm, no murmurs gallops or rub Soft with active bowel sounds No clubbing cyanosis Trace Edema Alert and oriented, grossly normal motor and sensory function Skin Warm and Dry  ECG sinus at 77 Intervals 23/18/48 Left bundle branch block  CrCl cannot be calculated (Patient's most recent lab result is older than the maximum 21 days allowed.).   Assessment and  Plan  Nonischemic cardiomyopathy  Left bundle branch block/first-degree AV block  Congestive heart failure-chronic-systolic class III  COPD-oxygen dependent  RV function/dysfunction-variable echoes   The patient has nonischemic  cardiomyopathy with persistent LV dysfunction in the setting of left bundle branch block.  Cardiac resynchronization is a reasonable undertaking not withstanding his lung issues and his RV failure although his most recent echo suggests that there has been interval recovery of RV systolic function.  The role of ICD therapy in this older man is not at all clear to me.  Given his age in the context of nonischemic myopathy based on [place] the benefits of ICD therapy are low and or further attenuated, given his oxygen dependent COPD and his RV dysfunction   Moreover, in the event that he would respond to CRT there is mortality benefit in this group based on CARE HF  He will discuss this amongst his family and we will be back in touch with him later this week.     Current medicines are reviewed at length with the patient today .  The patient does not have concerns regarding medicines.

## 2019-12-12 NOTE — Patient Instructions (Addendum)
Medication Instructions:  Your physician recommends that you continue on your current medications as directed. Please refer to the Current Medication list given to you today.  *If you need a refill on your cardiac medications before your next appointment, please call your pharmacy*   Lab Work: None ordered.  If you have labs (blood work) drawn today and your tests are completely normal, you will receive your results only by: Marland Kitchen MyChart Message (if you have MyChart) OR . A paper copy in the mail If you have any lab test that is abnormal or we need to change your treatment, we will call you to review the results.   Testing/Procedures: None ordered.    Follow-Up: At [facility], you and your health needs are our priority.  As part of our continuing mission to provide you with exceptional heart care, we have created designated Provider Care Teams.  These Care Teams include your primary Cardiologist (physician) and Advanced Practice Providers (APPs -  Physician Assistants and Nurse Practitioners) who all work together to provide you with the care you need, when you need it.  We recommend signing up for the patient portal called "MyChart".  Sign up information is provided on this After Visit Summary.  MyChart is used to connect with patients for Virtual Visits (Telemedicine).  Patients are able to view lab/test results, encounter notes, upcoming appointments, etc.  Non-urgent messages can be sent to your provider as well.   To learn more about what you can do with MyChart, go to [email or web address].    Your next appointment:   Please call [phone] once you decide to move forward with your device implant or I will contact you on 12/19/2019.

## 2019-12-14 ENCOUNTER — Telehealth: Payer: No Typology Code available for payment source | Admitting: Internal Medicine

## 2019-12-21 ENCOUNTER — Telehealth: Payer: Self-pay

## 2019-12-21 DIAGNOSIS — Z01812 Encounter for preprocedural laboratory examination: Secondary | ICD-10-CM

## 2019-12-21 DIAGNOSIS — I428 Other cardiomyopathies: Secondary | ICD-10-CM

## 2019-12-21 NOTE — Telephone Encounter (Signed)
Attempted phone call to pt.  Left voicemail to contact RN at [phone]. 

## 2019-12-21 NOTE — Telephone Encounter (Signed)
Spoke with pt who states he is willing to move forward with device implant.  Pt advised once procedure is scheduled will call pt with all details.  Pt verbalizes understanding and agrees with current plan.

## 2019-12-29 NOTE — Telephone Encounter (Signed)
Spoke with pt and reviewed device instruction letter for pt's CRT-P implant scheduled for 01/17/2020.  See letter for complete details.  Pt verbalized understanding of all instructions and agrees with current plan.

## 2020-01-02 ENCOUNTER — Telehealth: Payer: Self-pay | Admitting: Internal Medicine

## 2020-01-02 NOTE — Telephone Encounter (Signed)
I haven't heard of any reason why it wouldn't be OK.  He should proceed with his COVID vaccine as planned.  Thank you!  Legrand Como [address]" [place], PA-C  01/02/2020 9:25 AM

## 2020-01-02 NOTE — Telephone Encounter (Signed)
Spoke with the pt and informed him that per Oda Kilts PA-C, there is no reason why it wouldn't be ok for him to receive both covid vaccinations and getting his scheduled pacemaker implanted. Also advised the pt that he will need to sign a DPR form next time he comes into our office, indicating its ok to speak with his daughter [name] about his medical concerns with our facility.  Pt verbalized understanding and agrees with this plan.

## 2020-01-02 NOTE — Telephone Encounter (Signed)
Device, do you have any input on this?  I have not heard of any contraindication between getting pacemaker implanted and receiving covid vaccination, but wanted to double check with you guys.   Pt will be getting his pacemaker on 01/17/20.  He will be due to get his 2nd covid vaccine afterwards, on 01/24/20.  Is there any contraindication for him to not get his vaccine after pacemaker implantation on 9/15?  Daughter is asking.  Please advise and thank you!

## 2020-01-02 NOTE — Telephone Encounter (Signed)
New message   Pt daughter would like a call to discuss pts covid shot. He is scheduled to receive his first Sept 1st, he had to wait 90 days since he had covid in April. His 2nd shot would be due on 9/22 which would be exactly 1 week after his CRT-P implant. She wants to know if this would be ok ?

## 2020-01-12 ENCOUNTER — Other Ambulatory Visit: Payer: Self-pay

## 2020-01-12 ENCOUNTER — Other Ambulatory Visit: Payer: No Typology Code available for payment source | Admitting: *Deleted

## 2020-01-12 DIAGNOSIS — I428 Other cardiomyopathies: Secondary | ICD-10-CM

## 2020-01-12 DIAGNOSIS — Z01812 Encounter for preprocedural laboratory examination: Secondary | ICD-10-CM

## 2020-01-13 ENCOUNTER — Other Ambulatory Visit (HOSPITAL_COMMUNITY)
Admission: RE | Admit: 2020-01-13 | Discharge: 2020-01-13 | Disposition: A | Discharge status: Home / Self Care | Payer: No Typology Code available for payment source | Source: Ambulatory Visit | Attending: Internal Medicine | Admitting: Internal Medicine

## 2020-01-13 DIAGNOSIS — Z01818 Encounter for other preprocedural examination: Secondary | ICD-10-CM | POA: Insufficient documentation

## 2020-01-13 DIAGNOSIS — Z20822 Contact with and (suspected) exposure to covid-19: Secondary | ICD-10-CM | POA: Diagnosis not present

## 2020-01-13 LAB — BASIC METABOLIC PANEL
BUN/Creatinine Ratio: 18 (ref 10–24)
BUN: 27 mg/dL (ref 8–27)
CO2: 30 mmol/L — ABNORMAL HIGH (ref 20–29)
Calcium: 9.7 mg/dL (ref 8.6–10.2)
Chloride: 100 mmol/L (ref 96–106)
Creatinine, Ser: 1.46 mg/dL — ABNORMAL HIGH (ref 0.76–1.27)
GFR calc Af Amer: 54 mL/min/{1.73_m2} — ABNORMAL LOW (ref >59)
GFR calc non Af Amer: 46 mL/min/{1.73_m2} — ABNORMAL LOW (ref >59)
Glucose: 73 mg/dL (ref 65–99)
Potassium: 4.1 mmol/L (ref 3.5–5.2)
Sodium: 143 mmol/L (ref 134–144)

## 2020-01-13 LAB — CBC
Hematocrit: 45.8 % (ref 37.5–51.0)
Hemoglobin: 15.7 g/dL (ref 13.0–17.7)
MCH: 31.2 pg (ref 26.6–33.0)
MCHC: 34.3 g/dL (ref 31.5–35.7)
MCV: 91 fL (ref 79–97)
Platelets: 136 10*3/uL — ABNORMAL LOW (ref 150–450)
RBC: 5.04 x10E6/uL (ref 4.14–5.80)
RDW: 12.1 % (ref 11.6–15.4)
WBC: 6.8 10*3/uL (ref 3.4–10.8)

## 2020-01-14 LAB — SARS CORONAVIRUS 2 (TAT 6-24 HRS): SARS Coronavirus 2: NEGATIVE

## 2020-01-15 NOTE — Progress Notes (Signed)
Instructed patient on the following items: Arrival time 6:30 Nothing to eat or drink after midnight No meds AM of procedure Responsible person to drive you home and stay with you for 24 hrs Wash with special soap night before and morning of procedure

## 2020-01-17 ENCOUNTER — Ambulatory Visit (HOSPITAL_COMMUNITY)
Admission: RE | Admit: 2020-01-17 | Discharge: 2020-01-17 | Disposition: A | Discharge status: Home / Self Care | Payer: No Typology Code available for payment source | Attending: Internal Medicine | Admitting: Internal Medicine

## 2020-01-17 ENCOUNTER — Ambulatory Visit (HOSPITAL_COMMUNITY): Payer: No Typology Code available for payment source

## 2020-01-17 ENCOUNTER — Ambulatory Visit (HOSPITAL_COMMUNITY)
Admission: RE | Disposition: A | Discharge status: Home / Self Care | Payer: No Typology Code available for payment source | Source: Home / Self Care | Attending: Internal Medicine

## 2020-01-17 ENCOUNTER — Other Ambulatory Visit: Payer: Self-pay

## 2020-01-17 DIAGNOSIS — J449 Chronic obstructive pulmonary disease, unspecified: Secondary | ICD-10-CM | POA: Insufficient documentation

## 2020-01-17 DIAGNOSIS — K219 Gastro-esophageal reflux disease without esophagitis: Secondary | ICD-10-CM | POA: Insufficient documentation

## 2020-01-17 DIAGNOSIS — Z959 Presence of cardiac and vascular implant and graft, unspecified: Secondary | ICD-10-CM

## 2020-01-17 DIAGNOSIS — Z6837 Body mass index (BMI) 37.0-37.9, adult: Secondary | ICD-10-CM | POA: Diagnosis not present

## 2020-01-17 DIAGNOSIS — I130 Hypertensive heart and chronic kidney disease with heart failure and stage 1 through stage 4 chronic kidney disease, or unspecified chronic kidney disease: Secondary | ICD-10-CM | POA: Insufficient documentation

## 2020-01-17 DIAGNOSIS — E669 Obesity, unspecified: Secondary | ICD-10-CM | POA: Diagnosis not present

## 2020-01-17 DIAGNOSIS — I5032 Chronic diastolic (congestive) heart failure: Secondary | ICD-10-CM | POA: Diagnosis not present

## 2020-01-17 DIAGNOSIS — I2510 Atherosclerotic heart disease of native coronary artery without angina pectoris: Secondary | ICD-10-CM | POA: Insufficient documentation

## 2020-01-17 DIAGNOSIS — Z79899 Other long term (current) drug therapy: Secondary | ICD-10-CM | POA: Diagnosis not present

## 2020-01-17 DIAGNOSIS — Z7951 Long term (current) use of inhaled steroids: Secondary | ICD-10-CM | POA: Diagnosis not present

## 2020-01-17 DIAGNOSIS — Z87891 Personal history of nicotine dependence: Secondary | ICD-10-CM | POA: Insufficient documentation

## 2020-01-17 DIAGNOSIS — Z9981 Dependence on supplemental oxygen: Secondary | ICD-10-CM | POA: Diagnosis not present

## 2020-01-17 DIAGNOSIS — I447 Left bundle-branch block, unspecified: Secondary | ICD-10-CM

## 2020-01-17 DIAGNOSIS — I440 Atrioventricular block, first degree: Secondary | ICD-10-CM | POA: Diagnosis not present

## 2020-01-17 DIAGNOSIS — M109 Gout, unspecified: Secondary | ICD-10-CM | POA: Insufficient documentation

## 2020-01-17 DIAGNOSIS — E785 Hyperlipidemia, unspecified: Secondary | ICD-10-CM | POA: Diagnosis not present

## 2020-01-17 DIAGNOSIS — I428 Other cardiomyopathies: Secondary | ICD-10-CM | POA: Insufficient documentation

## 2020-01-17 DIAGNOSIS — N183 Chronic kidney disease, stage 3 unspecified: Secondary | ICD-10-CM | POA: Insufficient documentation

## 2020-01-17 DIAGNOSIS — Z7984 Long term (current) use of oral hypoglycemic drugs: Secondary | ICD-10-CM | POA: Diagnosis not present

## 2020-01-17 DIAGNOSIS — I509 Heart failure, unspecified: Secondary | ICD-10-CM | POA: Diagnosis not present

## 2020-01-17 HISTORY — PX: PACEMAKER IMPLANT: EP1218

## 2020-01-17 SURGERY — PACEMAKER IMPLANT

## 2020-01-17 MED ORDER — FUROSEMIDE 10 MG/ML IJ SOLN
INTRAMUSCULAR | Status: AC
  Filled 2020-01-17: qty 4

## 2020-01-17 MED ORDER — FENTANYL CITRATE (PF) 100 MCG/2ML IJ SOLN
INTRAMUSCULAR | Status: DC | PRN
Start: 2020-01-17 — End: 2020-01-17
  Administered 2020-01-17: 12.5 ug via INTRAVENOUS
  Administered 2020-01-17: 25 ug via INTRAVENOUS

## 2020-01-17 MED ORDER — ACETAMINOPHEN 325 MG PO TABS: 325.0000 mg | ORAL_TABLET | ORAL | Status: DC | PRN

## 2020-01-17 MED ORDER — SODIUM CHLORIDE 0.9 % IV SOLN: INTRAVENOUS | Status: DC

## 2020-01-17 MED ORDER — MIDAZOLAM HCL 5 MG/5ML IJ SOLN
INTRAMUSCULAR | Status: DC | PRN
  Administered 2020-01-17 (×4): 1 mg via INTRAVENOUS
  Administered 2020-01-17: 2 mg via INTRAVENOUS

## 2020-01-17 MED ORDER — FUROSEMIDE 10 MG/ML IJ SOLN
INTRAMUSCULAR | Status: DC | PRN
  Administered 2020-01-17: 80 mg via INTRAVENOUS

## 2020-01-17 MED ORDER — CEFAZOLIN SODIUM-DEXTROSE 2-4 GM/100ML-% IV SOLN: 2.0000 g | INTRAVENOUS | Status: DC

## 2020-01-17 MED ORDER — MIDAZOLAM HCL 5 MG/5ML IJ SOLN
INTRAMUSCULAR | Status: AC
  Filled 2020-01-17: qty 5

## 2020-01-17 MED ORDER — LIDOCAINE HCL 1 % IJ SOLN
INTRAMUSCULAR | Status: AC
  Filled 2020-01-17: qty 60

## 2020-01-17 MED ORDER — CEFAZOLIN SODIUM-DEXTROSE 2-3 GM-%(50ML) IV SOLR
INTRAVENOUS | Status: AC | PRN
  Administered 2020-01-17: 2 g via INTRAVENOUS

## 2020-01-17 MED ORDER — HEPARIN (PORCINE) IN NACL 1000-0.9 UT/500ML-% IV SOLN
INTRAVENOUS | Status: AC
  Filled 2020-01-17: qty 500

## 2020-01-17 MED ORDER — LIDOCAINE HCL (PF) 1 % IJ SOLN
INTRAMUSCULAR | Status: DC | PRN
  Administered 2020-01-17: 15 mL

## 2020-01-17 MED ORDER — LEVALBUTEROL HCL 0.63 MG/3ML IN NEBU
0.6300 mg | INHALATION_SOLUTION | Freq: Four times a day (QID) | RESPIRATORY_TRACT | Status: DC | PRN
  Administered 2020-01-17: 0.63 mg via RESPIRATORY_TRACT
  Filled 2020-01-17: qty 3

## 2020-01-17 MED ORDER — CHLORHEXIDINE GLUCONATE 4 % EX LIQD: 4.0000 "application " | Freq: Once | CUTANEOUS | Status: DC

## 2020-01-17 MED ORDER — SODIUM CHLORIDE 0.9 % IV SOLN
80.0000 mg | INTRAVENOUS | Status: AC
  Administered 2020-01-17: 80 mg

## 2020-01-17 MED ORDER — SODIUM CHLORIDE 0.9 % IV SOLN
INTRAVENOUS | Status: AC
  Filled 2020-01-17: qty 2

## 2020-01-17 MED ORDER — FENTANYL CITRATE (PF) 100 MCG/2ML IJ SOLN
INTRAMUSCULAR | Status: AC
  Filled 2020-01-17: qty 2

## 2020-01-17 MED ORDER — CEFAZOLIN SODIUM-DEXTROSE 2-4 GM/100ML-% IV SOLN
INTRAVENOUS | Status: AC
  Filled 2020-01-17: qty 100

## 2020-01-17 MED ORDER — ONDANSETRON HCL 4 MG/2ML IJ SOLN: 4.0000 mg | Freq: Four times a day (QID) | INTRAMUSCULAR | Status: DC | PRN

## 2020-01-17 SURGICAL SUPPLY — 18 items
ADAPTER SEALING SSA-EW-09 (MISCELLANEOUS) ×2 IMPLANT
ADPR INTRO LNG 9FR SL XTD WNG (MISCELLANEOUS) ×1
CABLE SURGICAL S-101-97-12 (CABLE) ×3 IMPLANT
CATH ATTAIN COM SURV 6250V-EH (CATHETERS) ×3 IMPLANT
CATH ATTAIN COM SURV 6250V-MB2 (CATHETERS) ×3 IMPLANT
CATH CPS DIRECT 135 DS2C020 (CATHETERS) ×2 IMPLANT
CATH CPS DIRECT WD DS2C028 (CATHETERS) ×3 IMPLANT
CATH CPS QUART CN DS2N029-65 (CATHETERS) ×3 IMPLANT
CATH OCTAPOLOR 6F 125CM 2-5-2 (CATHETERS) ×2 IMPLANT
LEAD TENDRIL MRI 52CM LPA1200M (Lead) ×2 IMPLANT
LEAD TENDRIL MRI 58CM LPA1200M (Lead) ×3 IMPLANT
PACEMAKER ALLR CRT-P RF PM3222 (Pacemaker) IMPLANT
PAD PRO RADIOLUCENT 2001M-C (PAD) ×3 IMPLANT
PPM ALLURE CRT-P RF PM3222 (Pacemaker) ×3 IMPLANT
SHEATH 8FR PRELUDE SNAP 13 (SHEATH) ×4 IMPLANT
SHEATH 9.5FR PRELUDE SNAP 13 (SHEATH) ×3 IMPLANT
TRAY PACEMAKER INSERTION (PACKS) ×3 IMPLANT
WIRE HI TORQ VERSACORE-J 145CM (WIRE) ×2 IMPLANT

## 2020-01-17 NOTE — Discharge Instructions (Signed)
Biventricular Pacemaker Implantation, Care After This sheet gives you information about how to care for yourself after your procedure. Your health care provider may also give you more specific instructions. If you have problems or questions, contact your health care provider. What can I expect after the procedure? After the procedure, it is common to have:  Mild pain or soreness in your chest for several days.  A small amount of blood or clear fluid coming from your incision.  A slight bump in your chest where the pulse generator was placed. You may be able to feel the generator under your skin. This is normal. Follow these instructions at home: Medicines  Take over-the-counter and prescription medicines only as told by your health care provider.  Do not take any new medicines without asking your health care provider first.  If you were prescribed an antibiotic medicine, take it as told by your health care provider. Do not stop taking the antibiotic even if you start to feel better.  Ask your health care provider if the medicine prescribed to you requires you to avoid driving or using heavy machinery. Incision care      Keep your incision area clean and dry.  Avoid rubbing the incision and the area around the incision.  Follow instructions from your health care provider about how to take care of your incision. Make sure you: ? Wash your hands with soap and water before and after you change your bandage (dressing). If soap and water are not available, use hand sanitizer. ? Change your dressing as told by your health care provider. ? Leave stitches (sutures), skin glue, or adhesive strips in place. These skin closures may need to stay in place for 2 weeks or longer. If adhesive strip edges start to loosen and curl up, you may trim the loose edges. Do not remove adhesive strips completely unless your health care provider tells you to do that.  Check your incision area every day for  signs of infection. Check for: ? More redness, swelling, or pain. ? More fluid or blood. ? Warmth. ? Pus or a bad smell. Activity  Return to your normal activities as told by your health care provider. Ask your health care provider what activities are safe for you.  Do not lift anything that is heavier than 10 lb (4.5 kg), or the limit that you are told, until your health care provider says that it is safe.  Do not lift your upper arms above your shoulders for at least 6 weeks or as long as told by your health care provider. ? If you tend to sleep with your arms above your head, wear an arm restraint while you sleep to prevent this from happening. ? Avoid sudden movements that pull your upper arms far away from your body for at least 6 weeks.  Do a mild form of exercise at least once a day, such as walking. As you feel better, you may exercise more.  Gently stretch your shoulders at least once a day to help prevent stiffness in your chest.  Rest as told by your health care provider.  Avoid sitting for a long time without moving. Get up to take short walks every 1-2 hours. This is important to improve blood flow and breathing. Ask for help if you feel weak or unsteady. Electricity and magnetic fields  Avoid places and objects that have a strong electric or magnetic field. This includes: ? Airport Data processing manager. When you are at the airport,  tell officials that you have a pacemaker and show them your pacemaker identification card. Officials will check you in safely so that your device is not damaged. Do not allow magnetic wands to be waved near your pacemaker. That can make the pacemaker stop working. ? Metal detectors. If you must pass through a metal detector, walk through it quickly. Do not stop under the detector or stand near it. ? Power plants. ? Large electrical generators. ? Radiofrequency transmission towers, such as mobile phone and radio towers.  Do not use amateur Ship broker. If you are not sure whether something is safe to use, ask your health care provider. ? Some devices may be safe to use if you hold them at least 1 ft (0.3 m) from your pacemaker. These devices may include power tools, lawn mowers, and speakers.  When you talk on your mobile phone, hold it to your ear that is opposite from the side that your pacemaker is on. Do not leave your mobile phone in a pocket over your pacemaker. Long-term care  Carry your pacemaker identification card with you at all times, especially when you travel.  Consider wearing a medical alert bracelet or necklace that explains your pacemaker and any heart conditions you have.  Tell all health care providers who care for you that you have a pacemaker. This may prevent you from having an MRI because of the strong magnets used during that test.  Have your pacemaker checked every 3-6 months or as often as told by your health care provider. General instructions  Do not use any products that contain nicotine or tobacco, such as cigarettes, e-cigarettes, and chewing tobacco. These can delay incision healing after surgery. If you need help quitting, ask your health care provider.  Do not take baths, swim, or use a hot tub until your health care provider approves.  Do not wear tight clothing that could irritate the skin over your implant.  Follow instructions from your health care provider about eating or drinking restrictions.  Weigh yourself every day and write down your weight.  Keep all follow-up visits as told by your health care provider. This is important. Contact a health care provider if:  You gain 3 lb (1.4 kg) or more in 24 hours.  You have swelling in your feet, ankles, or legs.  You have an irregular heartbeat (palpitations).  You have more redness, swelling, or pain around your incision.  You have more fluid or blood coming from your incision.  Your incision area feels  warm to the touch.  You have pus or a bad smell coming from your incision. Get help right away if you:  Have chest pain.  Have a heart rate that drops below the lowest rate set for your pacemaker.  Have difficulty breathing.  Suddenly feel light-headed.  Have a fever.  Faint. These symptoms may represent a serious problem that is an emergency. Do not wait to see if the symptoms will go away. Get medical help right away. Call your local emergency services (911 in the [place] Do not drive yourself to the hospital. Summary  After the procedure, it is common to have mild pain or soreness in your chest for several days.  Take over-the-counter and prescription medicines only as told by your health care provider.  Ask your health care provider what activities are safe for you.  Carry your pacemaker identification card with you at all times, especially when you travel. This information is  not intended to replace advice given to you by your health care provider. Make sure you discuss any questions you have with your health care provider. Document Revised: 03/21/2018 Document Reviewed: 03/21/2018 Elsevier Patient Education  2020 [place] Discharge Instructions for  Pacemaker/Defibrillator Patients  Activity No heavy lifting or vigorous activity with your left/right arm for 6 to 8 weeks.  Do not raise your left/right arm above your head for one week.  Gradually raise your affected arm as drawn below.              01/21/2020                01/22/2020                01/23/2020              01/24/2020 __  NO DRIVING for  1 week   ; you may begin driving on  4/33/2951 .  WOUND CARE - Keep the wound area clean and dry.  Do not get this area wet , no showers for 24 hours; you may shower on  01/18/2020 evening . - Tomorrow, 01/18/20, stop wearing the arm sling - Dr. Caryl Comes used DERMABOND (skin glue) on your wound.  DO NOT peel this off, no not rub this area, pat  dry. - The tape/steri-strips on your wound will fall off; do not pull them off.  No bandage is needed on the site.  DO  NOT apply any creams, oils, or ointments to the wound area. - If you notice any drainage or discharge from the wound, any swelling or bruising at the site, or you develop a fever > 101? F after you are discharged home, call the office at once.  Special Instructions - You are still able to use cellular telephones; use the ear opposite the side where you have your pacemaker/defibrillator.  Avoid carrying your cellular phone near your device. - When traveling through airports, show security personnel your identification card to avoid being screened in the metal detectors.  Ask the security personnel to use the hand wand. - Avoid arc welding equipment, MRI testing (magnetic resonance imaging), TENS units (transcutaneous nerve stimulators).  Call the office for questions about other devices. - Avoid electrical appliances that are in poor condition or are not properly grounded. - Microwave ovens are safe to be near or to operate.

## 2020-01-17 NOTE — Progress Notes (Signed)
Dr Caryl Comes in and ok to d/c home after cxr if cxr okay

## 2020-01-17 NOTE — H&P (View-Only) (Signed)
Patient Care Team: [facility] as [facility]CP - General Deboraha Sprang, MD as PCP - Electrophysiology (Cardiology) Maryellen Pile, MD (Inactive) as Resident (Internal Medicine)   HPI  Ricky Hayden is a 74 y.o. 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  Date Cr K Hgb  9//20(scanned) 1.37 4.2 14.5         DATE TEST EF   10/17 Echo 30-35%   10/20 Echo 30-35%   4/21 Echo  20% RV dysfn severe  5/21 LHC  Cors min obstruction  8/21 Echo  20-25% RV function normal    ZIO  PVCs < 1%   Records and Results [facility]   Past Medical History:  Diagnosis Date  . Arthritis    osteoarthritis of left knee  . Ascending aorta dilatation (HCC)   . Balanitis    recurrent  . Cardiac arrhythmia    life threatening, secondary to CCB vs b- blockers  . Cardiomyopathy ([place])   . Chronic joint pain   . Chronic systolic CHF (congestive heart failure) ([place])   . CKD (chronic kidney disease), stage III   . COPD (chronic obstructive pulmonary disease) ([place])   . Coronary artery disease   . Dilated aortic root ([place])   . Enlarged prostate   . 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 GERD (gastroesophageal reflux disease)   . Gout   . Hiatal hernia   . Hyperlipidemia   . Hypertension   . Intracranial hematoma ([place]) 1995   history of, s/p evacuation by Dr. Sherwood Gambler  . Nocturia   . Obesity   . Pansinusitis    a.  complicated by brain abscess and bleeding requiring craniotomy in 1995.  Marland Kitchen PONV (postoperative nausea and vomiting)   . Sinusitis    s/p ethmoidectomy and nasal septoplasty  . Vertigo    intermitantly    Past Surgical History:  Procedure Laterality Date  . CIRCUMCISION N/A 11/20/2013   Procedure: CIRCUMCISION ADULT;   Surgeon: Claybon Jabs, MD;  Location: [facility];  Service: Urology;  Laterality: N/A;  . [place]   hematomy due to sinus infection   . RIGHT/LEFT HEART CATH AND CORONARY ANGIOGRAPHY N/A 09/20/2019   Procedure: RIGHT/LEFT HEART CATH AND CORONARY ANGIOGRAPHY;  Surgeon: Jolaine Artist, MD;  Location: [place] [facility];  Service: Cardiovascular;  Laterality: N/A;  . shoulder surg rt   1995  . SINUS SURGERY WITH INSTATRAK     ethmoidectomy and nasal septum repair   .med Current Facility-Administered Medications  Medication Dose Route Frequency Provider Last Rate Last Admin  . 0.9 %  sodium chloride infusion   Intravenous Continuous Deboraha Sprang, MD 50 mL/hr at 01/17/20 0721 New Bag at 01/17/20 0721  . 0.9 %  sodium chloride infusion   Intravenous Continuous Deboraha Sprang, MD 50 mL/hr at 01/17/20 0721 New Bag at 01/17/20 0721  . ceFAZolin (ANCEF) IVPB 2g/100 mL premix  2 g Intravenous On Call Deboraha Sprang, MD      . chlorhexidine (HIBICLENS) 4 % liquid 4 application  4 application Topical Once Deboraha Sprang, MD      . gentamicin (GARAMYCIN) 80 mg in sodium chloride 0.9 % 500 mL irrigation  80 mg Irrigation On Call Virl Axe  C, MD        Allergies  Allergen Reactions  . Calcium Channel Blockers Other (See Comments)    Came to hospital in 1995-caused chest pain       Social History   Tobacco Use  . Smoking status: Former Smoker    Quit date: 05/04/1986    Years since quitting: 33.7  . Smokeless tobacco: Never Used  Vaping Use  . Vaping Use: Never used  Substance Use Topics  . Alcohol use: No    Alcohol/week: 0.0 standard drinks  . Drug use: No     Family History  Problem Relation Age of Onset  . Heart failure Mother 5  . Heart attack Father 40     Current Meds  Medication Sig  . albuterol (PROVENTIL HFA;VENTOLIN HFA) 108 (90 BASE) MCG/ACT inhaler Inhale 2 puffs into the lungs every 6 (six) hours as needed for wheezing.  Marland Kitchen allopurinol (ZYLOPRIM) 100  MG tablet Take 2 tablets (200 mg total) by mouth daily.  . budesonide-formoterol (SYMBICORT) 80-4.5 MCG/ACT inhaler Inhale 2 puffs into the lungs 2 (two) times daily.  . carvedilol (COREG) 6.25 MG tablet Take 1 tablet (6.25 mg total) by mouth 2 (two) times daily with a meal.  . dapagliflozin propanediol (FARXIGA) 10 MG TABS tablet Take 10 mg by mouth daily.  . diclofenac Sodium (VOLTAREN) 1 % GEL Apply 2 g topically daily as needed (pain).   Marland Kitchen docusate sodium (COLACE) 100 MG capsule Take 1-2 capsules (100-200 mg total) by mouth 2 (two) times daily as needed for mild constipation.  . finasteride (PROSCAR) 5 MG tablet TAKE ONE TABLET BY MOUTH ONCE DAILY (Patient taking differently: Take 5 mg by mouth daily. )  . GINKGO BILOBA PO Take 2 tablets by mouth at bedtime.  . Multiple Vitamins-Minerals (ICAPS AREDS 2 PO) Take 2 tablets by mouth daily.  Marland Kitchen omeprazole (PRILOSEC) 20 MG capsule Take 1 capsule (20 mg total) by mouth daily.  Marland Kitchen PHENobarbital (LUMINAL) 64.8 MG tablet Take 1 tablet (64.8 mg total) by mouth 2 (two) times daily.  . polyethylene glycol (MIRALAX / GLYCOLAX) packet Take 17 g by mouth daily as needed for moderate constipation.  . pravastatin (PRAVACHOL) 40 MG tablet Take 1 tablet (40 mg total) by mouth every evening. (Patient taking differently: Take 40 mg by mouth at bedtime. )  . sacubitril-valsartan (ENTRESTO) 24-26 MG Take 1 tablet by mouth 2 (two) times daily.  Marland Kitchen SPIRIVA HANDIHALER 18 MCG inhalation capsule INHALE ONE DOSE BY MOUTH ONCE DAILY (Patient taking differently: Place 18 mcg into inhaler and inhale daily. )  . spironolactone (ALDACTONE) 25 MG tablet Take 0.5 tablets (12.5 mg total) by mouth daily.  . tamsulosin (FLOMAX) 0.4 MG CAPS capsule Take 1 capsule (0.4 mg total) by mouth daily.  Marland Kitchen torsemide (DEMADEX) 20 MG tablet Take two tablets in the am and one tablet in the pm. (Patient taking differently: Take 20-40 mg by mouth See admin instructions. Take 40 mg in the morning and 20  mg in the evening)     Review of Systems negative except from HPI and PMH  Physical Exam BP 118/80   Pulse 72   Temp 98.7 F (37.1 C) (Oral)   Resp 18   Ht 5\' 7"  (1.702 m)   Wt 102.5 kg   SpO2 99%   BMI 35.40 kg/m  Well developed and well nourished in no acute distress HENT normal E scleral and icterus clear Neck Supple JVP flat; carotids brisk and full Some  whee Regular rate and rhythm, no murmurs gallops or rub Soft with active bowel sounds No clubbing cyanosis  Edema Alert and oriented, grossly normal motor and sensory function Skin Warm and Dry    Assessment and  Plan Nonischemic cardiomyopathy  Left bundle branch block/first-degree AV block  Congestive heart failure-chronic-systolic class III  COPD-oxygen dependent//wheezing  RV function/dysfunction-variable echoes   For Pacer-CRT today  With wheezing will use CPAP--and give xoponex Rx prior to starting  The benefits and risks were reviewed including but not limited to death,  perforation, infection, lead dislodgement and device malfunction.  The patient understands agrees and is willing to proceed.

## 2020-01-17 NOTE — H&P (Signed)
Patient Care Team: [facility] as P[facility] - General Deboraha Sprang, MD as PCP - Electrophysiology (Cardiology) Maryellen Pile, MD (Inactive) as Resident (Internal Medicine)   HPI  Ricky Hayden is a 74 y.o. 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  Date Cr K Hgb  9//20(scanned) 1.37 4.2 14.5         DATE TEST EF   10/17 Echo 30-35%   10/20 Echo 30-35%   4/21 Echo  20% RV dysfn severe  5/21 LHC  Cors min obstruction  8/21 Echo  20-25% RV function normal    ZIO  PVCs < 1%   Records and Results [facility]   Past Medical History:  Diagnosis Date  . Arthritis    osteoarthritis of left knee  . Ascending aorta dilatation (HCC)   . Balanitis    recurrent  . Cardiac arrhythmia    life threatening, secondary to CCB vs b- blockers  . Cardiomyopathy ([place])   . Chronic joint pain   . Chronic systolic CHF (congestive heart failure) ([place])   . CKD (chronic kidney disease), stage III   . COPD (chronic obstructive pulmonary disease) ([place])   . Coronary artery disease   . Dilated aortic root ([place])   . Enlarged prostate   . 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 GERD (gastroesophageal reflux disease)   . Gout   . Hiatal hernia   . Hyperlipidemia   . Hypertension   . Intracranial hematoma ([place]) 1995   history of, s/p evacuation by Dr. Sherwood Gambler  . Nocturia   . Obesity   . Pansinusitis    a.  complicated by brain abscess and bleeding requiring craniotomy in 1995.  Marland Kitchen PONV (postoperative nausea and vomiting)   . Sinusitis    s/p ethmoidectomy and nasal septoplasty  . Vertigo    intermitantly    Past Surgical History:  Procedure Laterality Date  . CIRCUMCISION N/A 11/20/2013   Procedure: CIRCUMCISION ADULT;   Surgeon: Claybon Jabs, MD;  [facility];  Service: Urology;  Laterality: N/A;  . [place]   hematomy due to sinus infection   . RIGHT/LEFT HEART CATH AND CORONARY ANGIOGRAPHY N/A 09/20/2019   Procedure: RIGHT/LEFT HEART CATH AND CORONARY ANGIOGRAPHY;  Surgeon: Jolaine Artist, MD;  Location: [place] [facility];  Service: Cardiovascular;  Laterality: N/A;  . shoulder surg rt   1995  . SINUS SURGERY WITH INSTATRAK     ethmoidectomy and nasal septum repair   .med Current Facility-Administered Medications  Medication Dose Route Frequency Provider Last Rate Last Admin  . 0.9 %  sodium chloride infusion   Intravenous Continuous Deboraha Sprang, MD 50 mL/hr at 01/17/20 0721 New Bag at 01/17/20 0721  . 0.9 %  sodium chloride infusion   Intravenous Continuous Deboraha Sprang, MD 50 mL/hr at 01/17/20 0721 New Bag at 01/17/20 0721  . ceFAZolin (ANCEF) IVPB 2g/100 mL premix  2 g Intravenous On Call Deboraha Sprang, MD      . chlorhexidine (HIBICLENS) 4 % liquid 4 application  4 application Topical Once Deboraha Sprang, MD      . gentamicin (GARAMYCIN) 80 mg in sodium chloride 0.9 % 500 mL irrigation  80 mg Irrigation On Call Virl Axe  C, MD        Allergies  Allergen Reactions  . Calcium Channel Blockers Other (See Comments)    Came to hospital in 1995-caused chest pain       Social History   Tobacco Use  . Smoking status: Former Smoker    Quit date: 05/04/1986    Years since quitting: 33.7  . Smokeless tobacco: Never Used  Vaping Use  . Vaping Use: Never used  Substance Use Topics  . Alcohol use: No    Alcohol/week: 0.0 standard drinks  . Drug use: No     Family History  Problem Relation Age of Onset  . Heart failure Mother 60  . Heart attack Father 24     Current Meds  Medication Sig  . albuterol (PROVENTIL HFA;VENTOLIN HFA) 108 (90 BASE) MCG/ACT inhaler Inhale 2 puffs into the lungs every 6 (six) hours as needed for wheezing.  Marland Kitchen allopurinol (ZYLOPRIM) 100  MG tablet Take 2 tablets (200 mg total) by mouth daily.  . budesonide-formoterol (SYMBICORT) 80-4.5 MCG/ACT inhaler Inhale 2 puffs into the lungs 2 (two) times daily.  . carvedilol (COREG) 6.25 MG tablet Take 1 tablet (6.25 mg total) by mouth 2 (two) times daily with a meal.  . dapagliflozin propanediol (FARXIGA) 10 MG TABS tablet Take 10 mg by mouth daily.  . diclofenac Sodium (VOLTAREN) 1 % GEL Apply 2 g topically daily as needed (pain).   Marland Kitchen docusate sodium (COLACE) 100 MG capsule Take 1-2 capsules (100-200 mg total) by mouth 2 (two) times daily as needed for mild constipation.  . finasteride (PROSCAR) 5 MG tablet TAKE ONE TABLET BY MOUTH ONCE DAILY (Patient taking differently: Take 5 mg by mouth daily. )  . GINKGO BILOBA PO Take 2 tablets by mouth at bedtime.  . Multiple Vitamins-Minerals (ICAPS AREDS 2 PO) Take 2 tablets by mouth daily.  Marland Kitchen omeprazole (PRILOSEC) 20 MG capsule Take 1 capsule (20 mg total) by mouth daily.  Marland Kitchen PHENobarbital (LUMINAL) 64.8 MG tablet Take 1 tablet (64.8 mg total) by mouth 2 (two) times daily.  . polyethylene glycol (MIRALAX / GLYCOLAX) packet Take 17 g by mouth daily as needed for moderate constipation.  . pravastatin (PRAVACHOL) 40 MG tablet Take 1 tablet (40 mg total) by mouth every evening. (Patient taking differently: Take 40 mg by mouth at bedtime. )  . sacubitril-valsartan (ENTRESTO) 24-26 MG Take 1 tablet by mouth 2 (two) times daily.  Marland Kitchen SPIRIVA HANDIHALER 18 MCG inhalation capsule INHALE ONE DOSE BY MOUTH ONCE DAILY (Patient taking differently: Place 18 mcg into inhaler and inhale daily. )  . spironolactone (ALDACTONE) 25 MG tablet Take 0.5 tablets (12.5 mg total) by mouth daily.  . tamsulosin (FLOMAX) 0.4 MG CAPS capsule Take 1 capsule (0.4 mg total) by mouth daily.  Marland Kitchen torsemide (DEMADEX) 20 MG tablet Take two tablets in the am and one tablet in the pm. (Patient taking differently: Take 20-40 mg by mouth See admin instructions. Take 40 mg in the morning and 20  mg in the evening)     Review of Systems negative except from HPI and PMH  Physical Exam BP 118/80   Pulse 72   Temp 98.7 F (37.1 C) (Oral)   Resp 18   Ht 5\' 7"  (1.702 m)   Wt 102.5 kg   SpO2 99%   BMI 35.40 kg/m  Well developed and well nourished in no acute distress HENT normal E scleral and icterus clear Neck Supple JVP flat; carotids brisk and full Some  whee Regular rate and rhythm, no murmurs gallops or rub Soft with active bowel sounds No clubbing cyanosis  Edema Alert and oriented, grossly normal motor and sensory function Skin Warm and Dry    Assessment and  Plan Nonischemic cardiomyopathy  Left bundle branch block/first-degree AV block  Congestive heart failure-chronic-systolic class III  COPD-oxygen dependent//wheezing  RV function/dysfunction-variable echoes   For Pacer-CRT today  With wheezing will use CPAP--and give xoponex Rx prior to starting  The benefits and risks were reviewed including but not limited to death,  perforation, infection, lead dislodgement and device malfunction.  The patient understands agrees and is willing to proceed.

## 2020-01-18 ENCOUNTER — Telehealth: Payer: Self-pay

## 2020-01-18 ENCOUNTER — Telehealth: Payer: Self-pay | Admitting: *Deleted

## 2020-01-18 ENCOUNTER — Encounter (HOSPITAL_COMMUNITY): Payer: Self-pay | Admitting: Internal Medicine

## 2020-01-18 MED FILL — Lidocaine HCl Local Inj 1%: INTRAMUSCULAR | Qty: 15 | Status: AC

## 2020-01-18 MED FILL — Gentamicin Sulfate Inj 40 MG/ML: INTRAMUSCULAR | Qty: 80 | Status: AC

## 2020-01-18 MED FILL — Heparin Sod (Porcine)-NaCl IV Soln 1000 Unit/500ML-0.9%: INTRAVENOUS | Qty: 500 | Status: AC

## 2020-01-18 MED FILL — Lidocaine HCl Local Inj 1%: INTRAMUSCULAR | Qty: 40 | Status: AC

## 2020-01-18 NOTE — Telephone Encounter (Signed)
Spoke with pt and advised per Dr Caryl Comes labs are normal with exception of mild kidney dysfunction.  Pt verbalizes understanding and thanked Therapist, sports for call.

## 2020-01-18 NOTE — Telephone Encounter (Signed)
-----   Message from Deboraha Sprang, MD sent at 01/16/2020  7:02 PM EDT ----- Please Inform Patient that labs are normal x mild renal dysfunction  Thanks

## 2020-01-18 NOTE — Telephone Encounter (Signed)
Follow-up after same day discharge: Implant date:  01/17/20 MD: Virl Axe, MD Device:  Abbott PPM Location:  Left chest    Wound check visit:  01/30/20 at 4:00pm 91 day MD follow-up:  Needs to be scheduled  Remote Transmission received:  Yes, normal PPM function.  No alerts or episodes.  Dressing removed:  Yes, dermabond in place.  Educated pt and daughter, [name] [facility]), regarding wound care, activity progression, and Merlin monitor.  Pt requested recommendations from Dr. Caryl Comes regarding next steps due to aborted LV lead placement.  Advised I will route message to Dr. Caryl Comes for clarification. Pt and [name] in agreement with plan. Will forward to [facility] to arrange 91 day f/u appointment after clarifying plan.

## 2020-01-22 ENCOUNTER — Other Ambulatory Visit (HOSPITAL_COMMUNITY): Payer: Self-pay | Admitting: *Deleted

## 2020-01-22 ENCOUNTER — Telehealth: Payer: Self-pay | Admitting: Internal Medicine

## 2020-01-22 MED ORDER — ENTRESTO 24-26 MG PO TABS: 1.0000 | ORAL_TABLET | Freq: Two times a day (BID) | ORAL | 3 refills | Status: DC

## 2020-01-22 MED ORDER — CARVEDILOL 6.25 MG PO TABS
6.2500 mg | ORAL_TABLET | Freq: Two times a day (BID) | ORAL | 3 refills | Status: DC
Start: 2020-01-22 — End: 2021-10-01

## 2020-01-22 NOTE — Telephone Encounter (Signed)
    [name] called, she is worried since pt just got surgery and will have another one soon. He just wanted to know if its safe to get the 2nd covid shot in between his surgery

## 2020-01-22 NOTE — Telephone Encounter (Signed)
Pt is scheduled for LV lead implant by Dr. Lovena Le on 01/29/20 per notes.  Encounter closed.

## 2020-01-23 NOTE — Telephone Encounter (Signed)
Spoke with pt's daughter [name] at pt's request.  Pt's daughter states pt is due to have his second Covid vaccine this week on or after Wednesday 01/24/2020.  Daughter advised pt should probably wait as most people don't feel well after the second vaccine an pt could potentially run a fever.  Pt is due to have Covid testing on 01/26/2020 for surgical procedure.  Pt's device instruction letter has been released to [place].  Reviewed instructions with pt's daughter.   Pt's daughter verbalizes understanding and agrees with current plan.

## 2020-01-24 ENCOUNTER — Other Ambulatory Visit: Payer: Self-pay | Admitting: Internal Medicine

## 2020-01-24 DIAGNOSIS — I442 Atrioventricular block, complete: Secondary | ICD-10-CM

## 2020-01-26 ENCOUNTER — Other Ambulatory Visit (HOSPITAL_COMMUNITY)
Admission: RE | Admit: 2020-01-26 | Discharge: 2020-01-26 | Disposition: A | Discharge status: Home / Self Care | Payer: No Typology Code available for payment source | Source: Ambulatory Visit | Attending: Internal Medicine | Admitting: Internal Medicine

## 2020-01-26 DIAGNOSIS — Z20822 Contact with and (suspected) exposure to covid-19: Secondary | ICD-10-CM | POA: Diagnosis not present

## 2020-01-26 DIAGNOSIS — Z01812 Encounter for preprocedural laboratory examination: Secondary | ICD-10-CM | POA: Diagnosis present

## 2020-01-26 LAB — SARS CORONAVIRUS 2 (TAT 6-24 HRS): SARS Coronavirus 2: NEGATIVE

## 2020-01-29 ENCOUNTER — Telehealth: Payer: Self-pay | Admitting: Physician Assistant

## 2020-01-29 ENCOUNTER — Ambulatory Visit (HOSPITAL_COMMUNITY): Payer: No Typology Code available for payment source

## 2020-01-29 ENCOUNTER — Other Ambulatory Visit: Payer: Self-pay | Admitting: Internal Medicine

## 2020-01-29 ENCOUNTER — Ambulatory Visit (HOSPITAL_COMMUNITY)
Admission: RE | Disposition: A | Discharge status: Home / Self Care | Payer: Self-pay | Source: Home / Self Care | Attending: Internal Medicine

## 2020-01-29 ENCOUNTER — Ambulatory Visit (HOSPITAL_COMMUNITY)
Admission: RE | Admit: 2020-01-29 | Discharge: 2020-01-29 | Disposition: A | Discharge status: Home / Self Care | Payer: No Typology Code available for payment source | Attending: Internal Medicine | Admitting: Internal Medicine

## 2020-01-29 ENCOUNTER — Other Ambulatory Visit: Payer: Self-pay

## 2020-01-29 DIAGNOSIS — I442 Atrioventricular block, complete: Secondary | ICD-10-CM

## 2020-01-29 DIAGNOSIS — M109 Gout, unspecified: Secondary | ICD-10-CM | POA: Insufficient documentation

## 2020-01-29 DIAGNOSIS — K219 Gastro-esophageal reflux disease without esophagitis: Secondary | ICD-10-CM | POA: Diagnosis not present

## 2020-01-29 DIAGNOSIS — Z87891 Personal history of nicotine dependence: Secondary | ICD-10-CM | POA: Diagnosis not present

## 2020-01-29 DIAGNOSIS — J449 Chronic obstructive pulmonary disease, unspecified: Secondary | ICD-10-CM | POA: Insufficient documentation

## 2020-01-29 DIAGNOSIS — Z7951 Long term (current) use of inhaled steroids: Secondary | ICD-10-CM | POA: Diagnosis not present

## 2020-01-29 DIAGNOSIS — I428 Other cardiomyopathies: Secondary | ICD-10-CM | POA: Insufficient documentation

## 2020-01-29 DIAGNOSIS — I440 Atrioventricular block, first degree: Secondary | ICD-10-CM | POA: Diagnosis not present

## 2020-01-29 DIAGNOSIS — Z959 Presence of cardiac and vascular implant and graft, unspecified: Secondary | ICD-10-CM

## 2020-01-29 DIAGNOSIS — Z6837 Body mass index (BMI) 37.0-37.9, adult: Secondary | ICD-10-CM | POA: Diagnosis not present

## 2020-01-29 DIAGNOSIS — Z79899 Other long term (current) drug therapy: Secondary | ICD-10-CM | POA: Insufficient documentation

## 2020-01-29 DIAGNOSIS — I130 Hypertensive heart and chronic kidney disease with heart failure and stage 1 through stage 4 chronic kidney disease, or unspecified chronic kidney disease: Secondary | ICD-10-CM | POA: Diagnosis not present

## 2020-01-29 DIAGNOSIS — Z7984 Long term (current) use of oral hypoglycemic drugs: Secondary | ICD-10-CM | POA: Diagnosis not present

## 2020-01-29 DIAGNOSIS — N183 Chronic kidney disease, stage 3 unspecified: Secondary | ICD-10-CM | POA: Diagnosis not present

## 2020-01-29 DIAGNOSIS — Z9981 Dependence on supplemental oxygen: Secondary | ICD-10-CM | POA: Diagnosis not present

## 2020-01-29 DIAGNOSIS — I447 Left bundle-branch block, unspecified: Secondary | ICD-10-CM | POA: Diagnosis not present

## 2020-01-29 DIAGNOSIS — I2510 Atherosclerotic heart disease of native coronary artery without angina pectoris: Secondary | ICD-10-CM | POA: Diagnosis not present

## 2020-01-29 DIAGNOSIS — I5022 Chronic systolic (congestive) heart failure: Secondary | ICD-10-CM | POA: Insufficient documentation

## 2020-01-29 DIAGNOSIS — E669 Obesity, unspecified: Secondary | ICD-10-CM | POA: Insufficient documentation

## 2020-01-29 DIAGNOSIS — E785 Hyperlipidemia, unspecified: Secondary | ICD-10-CM | POA: Insufficient documentation

## 2020-01-29 HISTORY — PX: BIV UPGRADE: EP1202

## 2020-01-29 LAB — CBC
HCT: 46.3 % (ref 39.0–52.0)
Hemoglobin: 15.1 g/dL (ref 13.0–17.0)
MCH: 31.1 pg (ref 26.0–34.0)
MCHC: 32.6 g/dL (ref 30.0–36.0)
MCV: 95.3 fL (ref 80.0–100.0)
Platelets: 151 10*3/uL (ref 150–400)
RBC: 4.86 MIL/uL (ref 4.22–5.81)
RDW: 13.4 % (ref 11.5–15.5)
WBC: 5.9 10*3/uL (ref 4.0–10.5)
nRBC: 0 % (ref 0.0–0.2)

## 2020-01-29 SURGERY — BIV UPGRADE

## 2020-01-29 MED ORDER — SODIUM CHLORIDE 0.9 % IV SOLN
INTRAVENOUS | Status: AC
  Filled 2020-01-29: qty 2

## 2020-01-29 MED ORDER — FENTANYL CITRATE (PF) 100 MCG/2ML IJ SOLN
INTRAMUSCULAR | Status: DC | PRN
Start: 2020-01-29 — End: 2020-01-29
  Administered 2020-01-29: 25 ug via INTRAVENOUS
  Administered 2020-01-29: 50 ug via INTRAVENOUS
  Administered 2020-01-29: 25 ug via INTRAVENOUS

## 2020-01-29 MED ORDER — MIDAZOLAM HCL 5 MG/5ML IJ SOLN
INTRAMUSCULAR | Status: DC | PRN
  Administered 2020-01-29 (×2): 2 mg via INTRAVENOUS

## 2020-01-29 MED ORDER — MIDAZOLAM HCL 5 MG/5ML IJ SOLN
INTRAMUSCULAR | Status: AC
  Filled 2020-01-29: qty 5

## 2020-01-29 MED ORDER — ONDANSETRON HCL 4 MG/2ML IJ SOLN: 4.0000 mg | Freq: Four times a day (QID) | INTRAMUSCULAR | Status: DC | PRN

## 2020-01-29 MED ORDER — OXYCODONE-ACETAMINOPHEN 5-325 MG PO TABS
1.0000 | ORAL_TABLET | Freq: Two times a day (BID) | ORAL | 0 refills | Status: DC
Start: 2020-01-29 — End: 2020-01-29

## 2020-01-29 MED ORDER — CEFAZOLIN SODIUM-DEXTROSE 2-4 GM/100ML-% IV SOLN
INTRAVENOUS | Status: AC
  Filled 2020-01-29: qty 100

## 2020-01-29 MED ORDER — CEFAZOLIN SODIUM-DEXTROSE 1-4 GM/50ML-% IV SOLN
1.0000 g | Freq: Four times a day (QID) | INTRAVENOUS | Status: DC
  Administered 2020-01-29: 1 g via INTRAVENOUS
  Filled 2020-01-29: qty 50

## 2020-01-29 MED ORDER — ACETAMINOPHEN 325 MG PO TABS
325.0000 mg | ORAL_TABLET | ORAL | Status: DC | PRN
  Administered 2020-01-29: 650 mg via ORAL
  Filled 2020-01-29 (×3): qty 2

## 2020-01-29 MED ORDER — SODIUM CHLORIDE 0.9 % IV SOLN
80.0000 mg | INTRAVENOUS | Status: AC
  Administered 2020-01-29: 80 mg
  Filled 2020-01-29: qty 2

## 2020-01-29 MED ORDER — OXYCODONE-ACETAMINOPHEN 5-325 MG PO TABS
1.0000 | ORAL_TABLET | Freq: Two times a day (BID) | ORAL | 0 refills | Status: DC | PRN
Start: 2020-01-29 — End: 2020-01-29

## 2020-01-29 MED ORDER — OXYCODONE-ACETAMINOPHEN 5-325 MG PO TABS
1.0000 | ORAL_TABLET | Freq: Two times a day (BID) | ORAL | 0 refills | Status: DC | PRN
Start: 2020-01-29 — End: 2021-05-15

## 2020-01-29 MED ORDER — ACETAMINOPHEN 325 MG PO TABS: 325.0000 mg | ORAL_TABLET | ORAL | Status: DC | PRN

## 2020-01-29 MED ORDER — OXYCODONE-ACETAMINOPHEN 5-325 MG PO TABS: 1.0000 | ORAL_TABLET | Freq: Two times a day (BID) | ORAL | 0 refills | Status: DC | PRN

## 2020-01-29 MED ORDER — LIDOCAINE HCL 1 % IJ SOLN
INTRAMUSCULAR | Status: AC
  Filled 2020-01-29: qty 60

## 2020-01-29 MED ORDER — CEFAZOLIN SODIUM-DEXTROSE 2-4 GM/100ML-% IV SOLN
2.0000 g | INTRAVENOUS | Status: AC
  Administered 2020-01-29: 2 g via INTRAVENOUS
  Filled 2020-01-29: qty 100

## 2020-01-29 MED ORDER — SODIUM CHLORIDE 0.9 % IV SOLN: INTRAVENOUS | Status: DC

## 2020-01-29 MED ORDER — LIDOCAINE HCL (PF) 1 % IJ SOLN
INTRAMUSCULAR | Status: DC | PRN
  Administered 2020-01-29: 60 mL

## 2020-01-29 MED ORDER — HEPARIN (PORCINE) IN NACL 1000-0.9 UT/500ML-% IV SOLN
INTRAVENOUS | Status: AC
  Filled 2020-01-29: qty 500

## 2020-01-29 MED ORDER — FENTANYL CITRATE (PF) 100 MCG/2ML IJ SOLN
INTRAMUSCULAR | Status: AC
  Filled 2020-01-29: qty 2

## 2020-01-29 MED ORDER — HEPARIN (PORCINE) IN NACL 2-0.9 UNITS/ML
INTRAMUSCULAR | Status: AC | PRN
  Administered 2020-01-29: 500 mL

## 2020-01-29 MED ORDER — SODIUM CHLORIDE 0.9 % IV SOLN
INTRAVENOUS | Status: DC | PRN
  Administered 2020-01-29: 500 mL

## 2020-01-29 SURGICAL SUPPLY — 11 items
CABLE SURGICAL S-101-97-12 (CABLE) ×2 IMPLANT
CATH RIGHTSITE C315HIS02 (CATHETERS) ×1 IMPLANT
LEAD SELECT SECURE 3830 383069 (Lead) IMPLANT
MAT PREVALON FULL STRYKER (MISCELLANEOUS) ×1 IMPLANT
PAD PRO RADIOLUCENT 2001M-C (PAD) ×2 IMPLANT
POUCH AIGIS-R ANTIBACT PPM (Mesh General) ×2 IMPLANT
SELECT SECURE 3830 383069 (Lead) ×2 IMPLANT
SHEATH 7FR PRELUDE SNAP 13 (SHEATH) ×1 IMPLANT
SLITTER 6232ADJ (MISCELLANEOUS) ×2 IMPLANT
TRAY PACEMAKER INSERTION (PACKS) ×2 IMPLANT
WIRE HI TORQ VERSACORE-J 145CM (WIRE) ×1 IMPLANT

## 2020-01-29 NOTE — Interval H&P Note (Signed)
History and Physical Interval Note:  01/29/2020 8:15 AM  Ricky Hayden  has presented today for surgery, with the diagnosis of LV Dysfunction, Non-ischemic Cardiomyopathy.  The various methods of treatment have been discussed with the patient and family. After consideration of risks, benefits and other options for treatment, the patient has consented to  Procedure(s): BIV UPGRADE (N/A) as a surgical intervention.  The patient's history has been reviewed, patient examined, no change in status, stable for surgery.  I have reviewed the patient's chart and labs.  Questions were answered to the patient's satisfaction.     Virl Axe  Plan is for LBBB area pacing following failed CS lead placement

## 2020-01-29 NOTE — Progress Notes (Addendum)
Dr. Caryl Comes has seen and examined the patient, provided post procedure teaching He has requested me to provide Percocet for the patient PDMP is reviewed He has phenobarbital only as active Rx I discussed with Dr. Caryl Comes, will give low dose Percocet 5/325mg , and only 4 tabs I printed the prescription for our [facility] to fill, though the patient had already left  In d/w RN, AVS did not have percocet on it I have voided and destroyed the hard copy prescription and removed from his d/c med list Tommye Standard, PA-C

## 2020-01-29 NOTE — Telephone Encounter (Signed)
Spoke with pt and advised per Dr Caryl Comes prescribed Percocet 5/325mg  #6 1 po bid with 0 RF called to [place] listed in pt's chart.

## 2020-01-29 NOTE — Progress Notes (Signed)
Dowell Hoon [id number]  [id number]  Preop Dx: LBBB and failed CRT Postop Dx same/  Procedure:LBBB area pacing  Cx: None       Virl Axe, MD 01/29/2020 9:27 AM

## 2020-01-29 NOTE — Progress Notes (Signed)
Called to cath lab to place patient on CPAP prior to procedure.  Patient placed on CPAP, auto titrate with 2l O2.  Patient tolerating well; no complications noted.

## 2020-01-29 NOTE — Progress Notes (Addendum)
Patient was seen by  Dr. Caryl Comes and stated he could go home.  Dr. Caryl Comes assessed patient and went over post instructions.

## 2020-01-29 NOTE — Telephone Encounter (Signed)
New Message:     Pt had pacemaker put in 2 weeks ago and they had to go back in today. Dr Caryl Comes told him he would give him something for pain. Would you please have him call this in today. He told his daughter [name] that he would do this.

## 2020-01-29 NOTE — Telephone Encounter (Addendum)
   [name] Iva Posten called the answering service after-hours today, states they were told Dr. Caryl Comes would call in rx for pain medication after his procedure at the hospital today but they called [facility] on [address] and it's not on file. I confirmed with [facility] personally they did not have it on file. It was listed in his prescriptions earlier from our [facility] office but listed as PRINT class and patient/daughter were not at that facility today. He was prescribed to take this twice a day with #6 tablets and zero refills.  [facility] initially said they will accept a faxed hardcopy so I faxed in rx for oxycodone-acetaminophen 5/325mg  take 1 tablet twice daily as needed for moderate-severe pain, disp #6 with zero refills (as previously intended). PDMP database was reviewed and patient did not have any inappropriate fills. Patient's daughter made aware that rx was faxed in and she was grateful for call, also updated our MAR. Will route to Dr. Olin Pia nurse to destroy previous printed rx.  ADDENDUM: I received a call back from the pt's daughter that [facility] was unable to accept faxed rx. Unfortunately I do not have access to the [place] system that allows for electronic fills tonight and we are not able to get this set up after-hours. I spoke with the pharmacist who was able to process the faxed rx tonight but requested that Dr. Caryl Comes re-send in an electronic rx tomorrow for this prescription so that they can fully process under proper channels. Will forward to Dr. Caryl Comes and his nurse to handle tomorrow with high priority. Patient's daughter was very grateful.  Charlie Pitter, PA-C

## 2020-01-29 NOTE — Discharge Instructions (Signed)
    Supplemental Discharge Instructions for  Pacemaker/Defibrillator Patients  Tomorrow, 01/30/2020, send in a device transmission  Activity No heavy lifting or vigorous activity with your left/right arm for 6 to 8 weeks.  Do not raise your left/right arm above your head for one week.  Gradually raise your affected arm as drawn below.             02/02/2020                 02/03/2020                02/04/2020              02/05/2020 __  NO DRIVING for  1 week   ; you may begin driving on  27/06/5364  .  WOUND CARE - Keep the wound area clean and dry.  Do not get this area wet , no showers until cleared to at your wound check visit - Tomorrow, 01/30/2020, remove the arm sling - Tomorrow, remove the outer plastic bandage.  Underneath the plastic bandage there are steris strips (paper tapes), DO NOT remove these. - The tape/steri-strips on your wound will fall off; do not pull them off.  No bandage is needed on the site.  DO  NOT apply any creams, oils, or ointments to the wound area. - If you notice any drainage or discharge from the wound, any swelling or bruising at the site, or you develop a fever > 101? F after you are discharged home, call the office at once.  Special Instructions - You are still able to use cellular telephones; use the ear opposite the side where you have your pacemaker/defibrillator.  Avoid carrying your cellular phone near your device. - When traveling through airports, show security personnel your identification card to avoid being screened in the metal detectors.  Ask the security personnel to use the hand wand. - Avoid arc welding equipment, MRI testing (magnetic resonance imaging), TENS units (transcutaneous nerve stimulators).  Call the office for questions about other devices. - Avoid electrical appliances that are in poor condition or are not properly grounded. - Microwave ovens are safe to be near or to operate.

## 2020-01-29 NOTE — Addendum Note (Signed)
Addended by: Charlie Pitter on: 01/29/2020 07:51 PM   Modules accepted: Orders

## 2020-01-30 ENCOUNTER — Ambulatory Visit: Payer: No Typology Code available for payment source

## 2020-01-30 ENCOUNTER — Encounter (HOSPITAL_COMMUNITY): Payer: Self-pay | Admitting: Internal Medicine

## 2020-01-30 ENCOUNTER — Telehealth: Payer: Self-pay | Admitting: Internal Medicine

## 2020-01-30 ENCOUNTER — Telehealth: Payer: Self-pay

## 2020-01-30 MED FILL — Lidocaine HCl Local Inj 1%: INTRAMUSCULAR | Qty: 60 | Status: AC

## 2020-01-30 NOTE — Telephone Encounter (Signed)
Spoke with pt's daughter, DPR and advised per Dr Caryl Comes pt most likely has tooth abscess and will need antibiotic.  Will need to establish care with dentist.   Pt's daughter states PCP wrote for antibiotic (Augmentin) and family will secure a dentist for pt.  Daughter thanked Therapist, sports for call.

## 2020-01-30 NOTE — Telephone Encounter (Signed)
Calling patient in regards to follow-up post PPM lead implant. Patient was assisted with family for this conversation and manual send.  D/c instructions reviewed with patient. Wound check apt. Reminded with patient 02/13/20 @ 10:40. Patient reports of chipped tooth on right upper side since procedure. States his cheeks and nose were very red yesterday, right side face swelling as well. Patient reports improvement of redness to cheeks and nose as well as swelling. Patient denies fever or chills. Called the [place], were going to prescribe abx, patient wanted to wait to contact Dr. Caryl Comes first to see what his thoughts were. Explained to patient/family. If patient had tube in airway to help him breath, his tooth could have chipped at some point related procedure, but I was unsure if that was what happened. Family verbalizes understanding. Advised patient/family to call if patient experiences increased swelling, fever or chills. Verbalizes understanding.  Routing to Dr. Caryl Comes for review and recommendations.   Manual transmission received 01/30/20 Presenting: AS/ BVP 86 No episodes.  LV lead impedance stable.

## 2020-01-30 NOTE — Telephone Encounter (Signed)
Spoke with pt's daughter who states pt began complaining of tooth pain and his tongue being "cut" after he came home from surgery yesterday.  Right side of jaw now swollen and red.  Pt's daughter states they contact pt's primary MD who states pt will most likely need antibiotic for tooth infection but deferred to Dr Caryl Comes due to pt having surgery yesterday.  Pt does not currently have a dentist.  Encouraged family to obtain dentist for pt. As he will need dental follow up.  Will forward information to Dr Caryl Comes for review and recommendation.  Pt's daughter verbalizes understanding and agrees with current plan.

## 2020-01-30 NOTE — Telephone Encounter (Signed)
Patient's daughter states the patient has been experiencing swelling on the right side of his face. She states swelling is also all over his tongue. She assumes swelling may be a reaction to PPM implant and she would like to know if patient needs to be taking an antibiotic. Please advise.

## 2020-01-30 NOTE — Telephone Encounter (Signed)
Spoke with pharmacy tech who states Rx was picked up for pt and no additional documentation is needed as she has hard copy of Rx in file for pt.  Explained to tech that Rx was called in yesterday to pharmacist by the name of Celesta Gentile and Dr Caryl Comes was to send signed E- copy to pharmacy but pt had to call after hours PA as there was no RX for oxycodone at the pharmacy.  See note below for complete details.  Pharmacy tech states she does not see E-copy but does have hard copy in system so nothing further is needed.

## 2020-02-03 NOTE — Telephone Encounter (Signed)
Leigh-- He had no tube--and nothing was done on the right side of his face He needs to see a dentist or internist

## 2020-02-06 NOTE — Telephone Encounter (Signed)
Following up regarding patients concerns to facial swelling/dental pain. Per Dr. Caryl Comes, patient should follow-up with dentist regarding pain.

## 2020-02-07 NOTE — Telephone Encounter (Signed)
Called patient in regards to following up with dentist.  No answer, LMOVM.

## 2020-02-13 ENCOUNTER — Ambulatory Visit: Payer: No Typology Code available for payment source

## 2020-02-13 ENCOUNTER — Ambulatory Visit (INDEPENDENT_AMBULATORY_CARE_PROVIDER_SITE_OTHER): Payer: No Typology Code available for payment source | Admitting: Emergency Medicine

## 2020-02-13 ENCOUNTER — Other Ambulatory Visit: Payer: Self-pay

## 2020-02-13 DIAGNOSIS — I428 Other cardiomyopathies: Secondary | ICD-10-CM

## 2020-02-13 DIAGNOSIS — I509 Heart failure, unspecified: Secondary | ICD-10-CM | POA: Diagnosis not present

## 2020-02-13 LAB — CUP PACEART INCLINIC DEVICE CHECK
Battery Remaining Longevity: 80 mo
Battery Voltage: 3.01 V
Brady Statistic RA Percent Paced: 0.22 %
Brady Statistic RV Percent Paced: 98 %
Date Time Interrogation Session: 20211012170405
Implantable Lead Implant Date: 20210915
Implantable Lead Implant Date: 20210915
Implantable Lead Implant Date: 20210927
Implantable Lead Location: 753858
Implantable Lead Location: 753859
Implantable Lead Location: 753860
Implantable Lead Model: 3830
Implantable Pulse Generator Implant Date: 20210915
Lead Channel Impedance Value: 462.5 Ohm
Lead Channel Impedance Value: 512.5 Ohm
Lead Channel Impedance Value: 600 Ohm
Lead Channel Pacing Threshold Amplitude: 0.5 V
Lead Channel Pacing Threshold Amplitude: 0.5 V
Lead Channel Pacing Threshold Amplitude: 0.5 V
Lead Channel Pacing Threshold Amplitude: 0.5 V
Lead Channel Pacing Threshold Amplitude: 0.5 V
Lead Channel Pacing Threshold Amplitude: 0.5 V
Lead Channel Pacing Threshold Pulse Width: 0.05 ms
Lead Channel Pacing Threshold Pulse Width: 0.05 ms
Lead Channel Pacing Threshold Pulse Width: 0.5 ms
Lead Channel Pacing Threshold Pulse Width: 0.5 ms
Lead Channel Pacing Threshold Pulse Width: 0.5 ms
Lead Channel Pacing Threshold Pulse Width: 0.5 ms
Lead Channel Sensing Intrinsic Amplitude: 12 mV
Lead Channel Sensing Intrinsic Amplitude: 4.9 mV
Lead Channel Setting Pacing Amplitude: 0.25 V
Lead Channel Setting Pacing Amplitude: 3.5 V
Lead Channel Setting Pacing Amplitude: 3.5 V
Lead Channel Setting Pacing Pulse Width: 0.05 ms
Lead Channel Setting Pacing Pulse Width: 0.5 ms
Lead Channel Setting Sensing Sensitivity: 2 mV
Pulse Gen Model: 3222
Pulse Gen Serial Number: 9188373

## 2020-02-13 NOTE — Progress Notes (Signed)
CRT-P device check in clinic. Normal device function. Thresholds, sensing, impedance consistent with previous measurements. RV is sub threshold per Dr. Caryl Comes. Histograms appropriate for patient and level of activity. No mode switches or ventricular high rate episodes. 1 PMT episode noted. Patient bi-ventricularly pacing 98% of the time. Device programmed with appropriate safety margins. Device heart failure diagnostics are within normal limits and stable over time. Estimated longevity 5.9-6.7 years. Patient enrolled in remote follow-up 04/18/20. ROV w/ SK 05/18/19.

## 2020-04-18 ENCOUNTER — Ambulatory Visit (INDEPENDENT_AMBULATORY_CARE_PROVIDER_SITE_OTHER): Payer: No Typology Code available for payment source

## 2020-04-18 DIAGNOSIS — I428 Other cardiomyopathies: Secondary | ICD-10-CM | POA: Diagnosis not present

## 2020-04-18 LAB — CUP PACEART REMOTE DEVICE CHECK
Battery Remaining Longevity: 75 mo
Battery Remaining Percentage: 95.5 %
Battery Voltage: 3.01 V
Brady Statistic AP VP Percent: 1 %
Brady Statistic AP VS Percent: 1 %
Brady Statistic AS VP Percent: 97 %
Brady Statistic AS VS Percent: 1.3 %
Brady Statistic RA Percent Paced: 1 %
Date Time Interrogation Session: 20211216063045
Implantable Lead Implant Date: 20210915
Implantable Lead Implant Date: 20210915
Implantable Lead Implant Date: 20210927
Implantable Lead Location: 753858
Implantable Lead Location: 753859
Implantable Lead Location: 753860
Implantable Lead Model: 3830
Implantable Pulse Generator Implant Date: 20210915
Lead Channel Impedance Value: 510 Ohm
Lead Channel Impedance Value: 590 Ohm
Lead Channel Impedance Value: 610 Ohm
Lead Channel Pacing Threshold Amplitude: 0.5 V
Lead Channel Pacing Threshold Amplitude: 0.5 V
Lead Channel Pacing Threshold Amplitude: 0.5 V
Lead Channel Pacing Threshold Pulse Width: 0.05 ms
Lead Channel Pacing Threshold Pulse Width: 0.5 ms
Lead Channel Pacing Threshold Pulse Width: 0.5 ms
Lead Channel Sensing Intrinsic Amplitude: 12 mV
Lead Channel Sensing Intrinsic Amplitude: 5 mV
Lead Channel Setting Pacing Amplitude: 0.25 V
Lead Channel Setting Pacing Amplitude: 3.5 V
Lead Channel Setting Pacing Amplitude: 3.5 V
Lead Channel Setting Pacing Pulse Width: 0.05 ms
Lead Channel Setting Pacing Pulse Width: 0.5 ms
Lead Channel Setting Sensing Sensitivity: 2 mV
Pulse Gen Model: 3222
Pulse Gen Serial Number: 9188373

## 2020-05-02 NOTE — Progress Notes (Signed)
Remote pacemaker transmission.   

## 2020-05-15 DIAGNOSIS — Z950 Presence of cardiac pacemaker: Secondary | ICD-10-CM | POA: Insufficient documentation

## 2020-05-17 ENCOUNTER — Encounter: Payer: Self-pay | Admitting: Internal Medicine

## 2020-05-17 ENCOUNTER — Other Ambulatory Visit: Payer: Self-pay

## 2020-05-17 ENCOUNTER — Ambulatory Visit (INDEPENDENT_AMBULATORY_CARE_PROVIDER_SITE_OTHER): Payer: No Typology Code available for payment source | Admitting: Internal Medicine

## 2020-05-17 ENCOUNTER — Encounter: Payer: No Typology Code available for payment source | Admitting: Internal Medicine

## 2020-05-17 VITALS — BP 122/76 | HR 78 | Ht 67.0 in | Wt 235.2 lb

## 2020-05-17 DIAGNOSIS — I509 Heart failure, unspecified: Secondary | ICD-10-CM | POA: Diagnosis not present

## 2020-05-17 DIAGNOSIS — I428 Other cardiomyopathies: Secondary | ICD-10-CM

## 2020-05-17 DIAGNOSIS — Z950 Presence of cardiac pacemaker: Secondary | ICD-10-CM

## 2020-05-17 NOTE — Progress Notes (Signed)
Patient Care Team: Clinic, [facility] as PCP - General Deboraha Sprang, MD as PCP - Electrophysiology (Cardiology) Maryellen Pile, MD (Inactive) as Resident (Internal Medicine)   HPI  Ricky Hayden is a 75 y.o. male seen in followup for CRT. St Jude implanted 9/21 I had failed an LV lead placement.  Dr. Lovena Le undertook left bundle branch block area pacing with post implant QRS duration of 106 ms   Cardiac consultation note 08/03/2019 describes a low ejection fraction heart failure on and off since 2016.  Denies coronary disease or bypass surgery.  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  Followed by the [facility].  Seen recently.  Some shoulder stiffness.  Date Cr K Hgb  9//20 (scanned)   1.37 4.2 14.5          DATE TEST EF   10/17 Echo  30-35%   10/20 Echo   30-35 %   4/21 Echo  20% RV dysfn severe  5/21 LHC  Cors min obstruction  8/21 Echo  20-25% RV function normal     ZIO  PVCs < 1%  He is much improved following aggressive therapy from the heart failure service.  Ongoing shortness of breath.  Mild edema.  Wears oxygen. Records and Results Reviewed   Past Medical History:  Diagnosis Date  . Arthritis    osteoarthritis of left knee  . Ascending aorta dilatation (HCC)   . Balanitis    recurrent  . Cardiac arrhythmia    life threatening, secondary to CCB vs b- blockers  . Cardiomyopathy ([place])   . Chronic joint pain   . Chronic systolic CHF (congestive heart failure) ([place])   . CKD (chronic kidney disease), stage III ([place])   . COPD (chronic obstructive pulmonary disease) ([place])   . Coronary artery disease   . Dilated aortic root ([place])   . Enlarged prostate   . 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 GERD (gastroesophageal reflux disease)   . Gout   . Hiatal hernia   .  Hyperlipidemia   . Hypertension   . Intracranial hematoma ([place]) 1995   history of, s/p evacuation by Dr. Sherwood Gambler  . Nocturia   . Obesity   . Pansinusitis    a.  complicated by brain abscess and bleeding requiring craniotomy in 1995.  Marland Kitchen PONV (postoperative nausea and vomiting)   . Sinusitis    s/p ethmoidectomy and nasal septoplasty  . Vertigo    intermitantly    Past Surgical History:  Procedure Laterality Date  . BIV UPGRADE N/A 01/29/2020   Procedure: BIV UPGRADE;  Surgeon: Evans Lance, MD;  Location: [facility];  Service: Cardiovascular;  Laterality: N/A;  . CIRCUMCISION N/A 11/20/2013   Procedure: CIRCUMCISION ADULT;  Surgeon: Claybon Jabs, MD;  Location: [facility];  Service: Urology;  Laterality: N/A;  . [place]   hematomy due to sinus infection   . PACEMAKER IMPLANT N/A 01/17/2020   Procedure: PACEMAKER IMPLANT;  Surgeon: Deboraha Sprang, MD;  Location: [facility];  Service: Cardiovascular;  Laterality: N/A;  . RIGHT/LEFT HEART CATH AND CORONARY ANGIOGRAPHY N/A 09/20/2019   Procedure: RIGHT/LEFT HEART CATH AND CORONARY ANGIOGRAPHY;  Surgeon: Jolaine Artist, MD;  Location: [facility];  Service: Cardiovascular;  Laterality: N/A;  .  shoulder surg rt   1995  . SINUS SURGERY WITH INSTATRAK     ethmoidectomy and nasal septum repair    Current Meds  Medication Sig  . albuterol (PROVENTIL HFA;VENTOLIN HFA) 108 (90 BASE) MCG/ACT inhaler Inhale 2 puffs into the lungs every 6 (six) hours as needed for wheezing.  . budesonide-formoterol (SYMBICORT) 80-4.5 MCG/ACT inhaler Inhale 2 puffs into the lungs 2 (two) times daily.  . carvedilol (COREG) 6.25 MG tablet Take 1 tablet (6.25 mg total) by mouth 2 (two) times daily with a meal.  . dapagliflozin propanediol (FARXIGA) 10 MG TABS tablet Take 10 mg by mouth daily.  . diclofenac Sodium (VOLTAREN) 1 % GEL Apply 2 g topically daily as needed (pain).   Marland Kitchen docusate sodium (COLACE) 100 MG capsule Take 1-2  capsules (100-200 mg total) by mouth 2 (two) times daily as needed for mild constipation.  . empagliflozin (JARDIANCE) 10 MG TABS tablet Take 1 tablet (10 mg total) by mouth daily before breakfast.  . finasteride (PROSCAR) 5 MG tablet TAKE ONE TABLET BY MOUTH ONCE DAILY  . GINKGO BILOBA PO Take 2 tablets by mouth at bedtime.  . Multiple Vitamins-Minerals (ICAPS AREDS 2 PO) Take 2 tablets by mouth daily.  Marland Kitchen omeprazole (PRILOSEC) 20 MG capsule Take 1 capsule (20 mg total) by mouth daily.  Marland Kitchen oxyCODONE-acetaminophen (PERCOCET/ROXICET) 5-325 MG tablet Take 1 tablet by mouth 2 (two) times daily as needed for moderate pain or severe pain.  Marland Kitchen PHENobarbital (LUMINAL) 64.8 MG tablet Take 1 tablet (64.8 mg total) by mouth 2 (two) times daily.  . polyethylene glycol (MIRALAX / GLYCOLAX) packet Take 17 g by mouth daily as needed for moderate constipation.  . pravastatin (PRAVACHOL) 40 MG tablet Take 1 tablet (40 mg total) by mouth every evening.  . sacubitril-valsartan (ENTRESTO) 24-26 MG Take 1 tablet by mouth 2 (two) times daily.  Marland Kitchen SPIRIVA HANDIHALER 18 MCG inhalation capsule INHALE ONE DOSE BY MOUTH ONCE DAILY  . spironolactone (ALDACTONE) 25 MG tablet Take 0.5 tablets (12.5 mg total) by mouth daily.  . tamsulosin (FLOMAX) 0.4 MG CAPS capsule Take 1 capsule (0.4 mg total) by mouth daily.  Marland Kitchen torsemide (DEMADEX) 20 MG tablet Take two tablets in the am and one tablet in the pm.    Allergies  Allergen Reactions  . Calcium Channel Blockers Other (See Comments)    Came to hospital in 1995-caused chest pain       Review of Systems negative except from HPI and PMH  Physical Exam BP 122/76   Pulse 78   Ht 5\' 7"  (1.702 m)   Wt 235 lb 3.2 oz (106.7 kg)   SpO2 95%   BMI 36.84 kg/m  Wound ok  Left shoulder stiffness  ECG demonstrates asynchronous pacing with a QRS duration of 94  CrCl cannot be calculated (Patient's most recent lab result is older than the maximum 21 days allowed.).   Assessment  and  Plan  Nonischemic cardiomyopathy  Left bundle branch block/first-degree AV block  Congestive heart failure-chronic-systolic class III  COPD-oxygen dependent  RV function/dysfunction-variable echoes  Frozen shoulder  [facility]  Encouraged him to follow-up with PT.  No further evaluation here.  No charge for today.     Current medicines are reviewed at length with the patient today .  The patient does not have concerns regarding medicines.

## 2020-05-17 NOTE — Patient Instructions (Signed)

## 2020-05-21 ENCOUNTER — Other Ambulatory Visit (HOSPITAL_COMMUNITY): Payer: Self-pay | Admitting: *Deleted

## 2020-05-21 MED ORDER — EMPAGLIFLOZIN 10 MG PO TABS
10.0000 mg | ORAL_TABLET | Freq: Every day | ORAL | 3 refills | Status: DC
Start: 2020-05-21 — End: 2021-07-03

## 2020-05-21 MED ORDER — EMPAGLIFLOZIN 10 MG PO TABS
10.0000 mg | ORAL_TABLET | Freq: Every day | ORAL | 3 refills | Status: DC
Start: 2020-05-21 — End: 2020-05-21

## 2020-05-24 LAB — CUP PACEART INCLINIC DEVICE CHECK
Battery Remaining Longevity: 121 mo
Battery Voltage: 3.01 V
Brady Statistic RA Percent Paced: 0.3 %
Brady Statistic RV Percent Paced: 98 %
Date Time Interrogation Session: 20220114155600
Implantable Lead Implant Date: 20210915
Implantable Lead Implant Date: 20210915
Implantable Lead Implant Date: 20210927
Implantable Lead Location: 753858
Implantable Lead Location: 753859
Implantable Lead Location: 753860
Implantable Lead Model: 3830
Implantable Pulse Generator Implant Date: 20210915
Lead Channel Impedance Value: 487.5 Ohm
Lead Channel Impedance Value: 537.5 Ohm
Lead Channel Impedance Value: 562.5 Ohm
Lead Channel Pacing Threshold Amplitude: 0.5 V
Lead Channel Pacing Threshold Amplitude: 0.5 V
Lead Channel Pacing Threshold Amplitude: 0.625 V
Lead Channel Pacing Threshold Pulse Width: 0.4 ms
Lead Channel Pacing Threshold Pulse Width: 0.4 ms
Lead Channel Pacing Threshold Pulse Width: 0.4 ms
Lead Channel Sensing Intrinsic Amplitude: 12 mV
Lead Channel Sensing Intrinsic Amplitude: 4.2 mV
Lead Channel Setting Pacing Amplitude: 0.25 V
Lead Channel Setting Pacing Amplitude: 1.5 V
Lead Channel Setting Pacing Amplitude: 2 V
Lead Channel Setting Pacing Pulse Width: 0.05 ms
Lead Channel Setting Pacing Pulse Width: 0.4 ms
Lead Channel Setting Sensing Sensitivity: 2 mV
Pulse Gen Model: 3222
Pulse Gen Serial Number: 9188373

## 2020-10-04 IMAGING — CR DG CHEST 2V
2 series · 2 of 2 positions shown · non-contrast
Comparison: 01/17/2020.

CLINICAL DATA: Pacemaker.  Sore arm.

EXAM:
CHEST - 2 VIEW

[w chest pa]
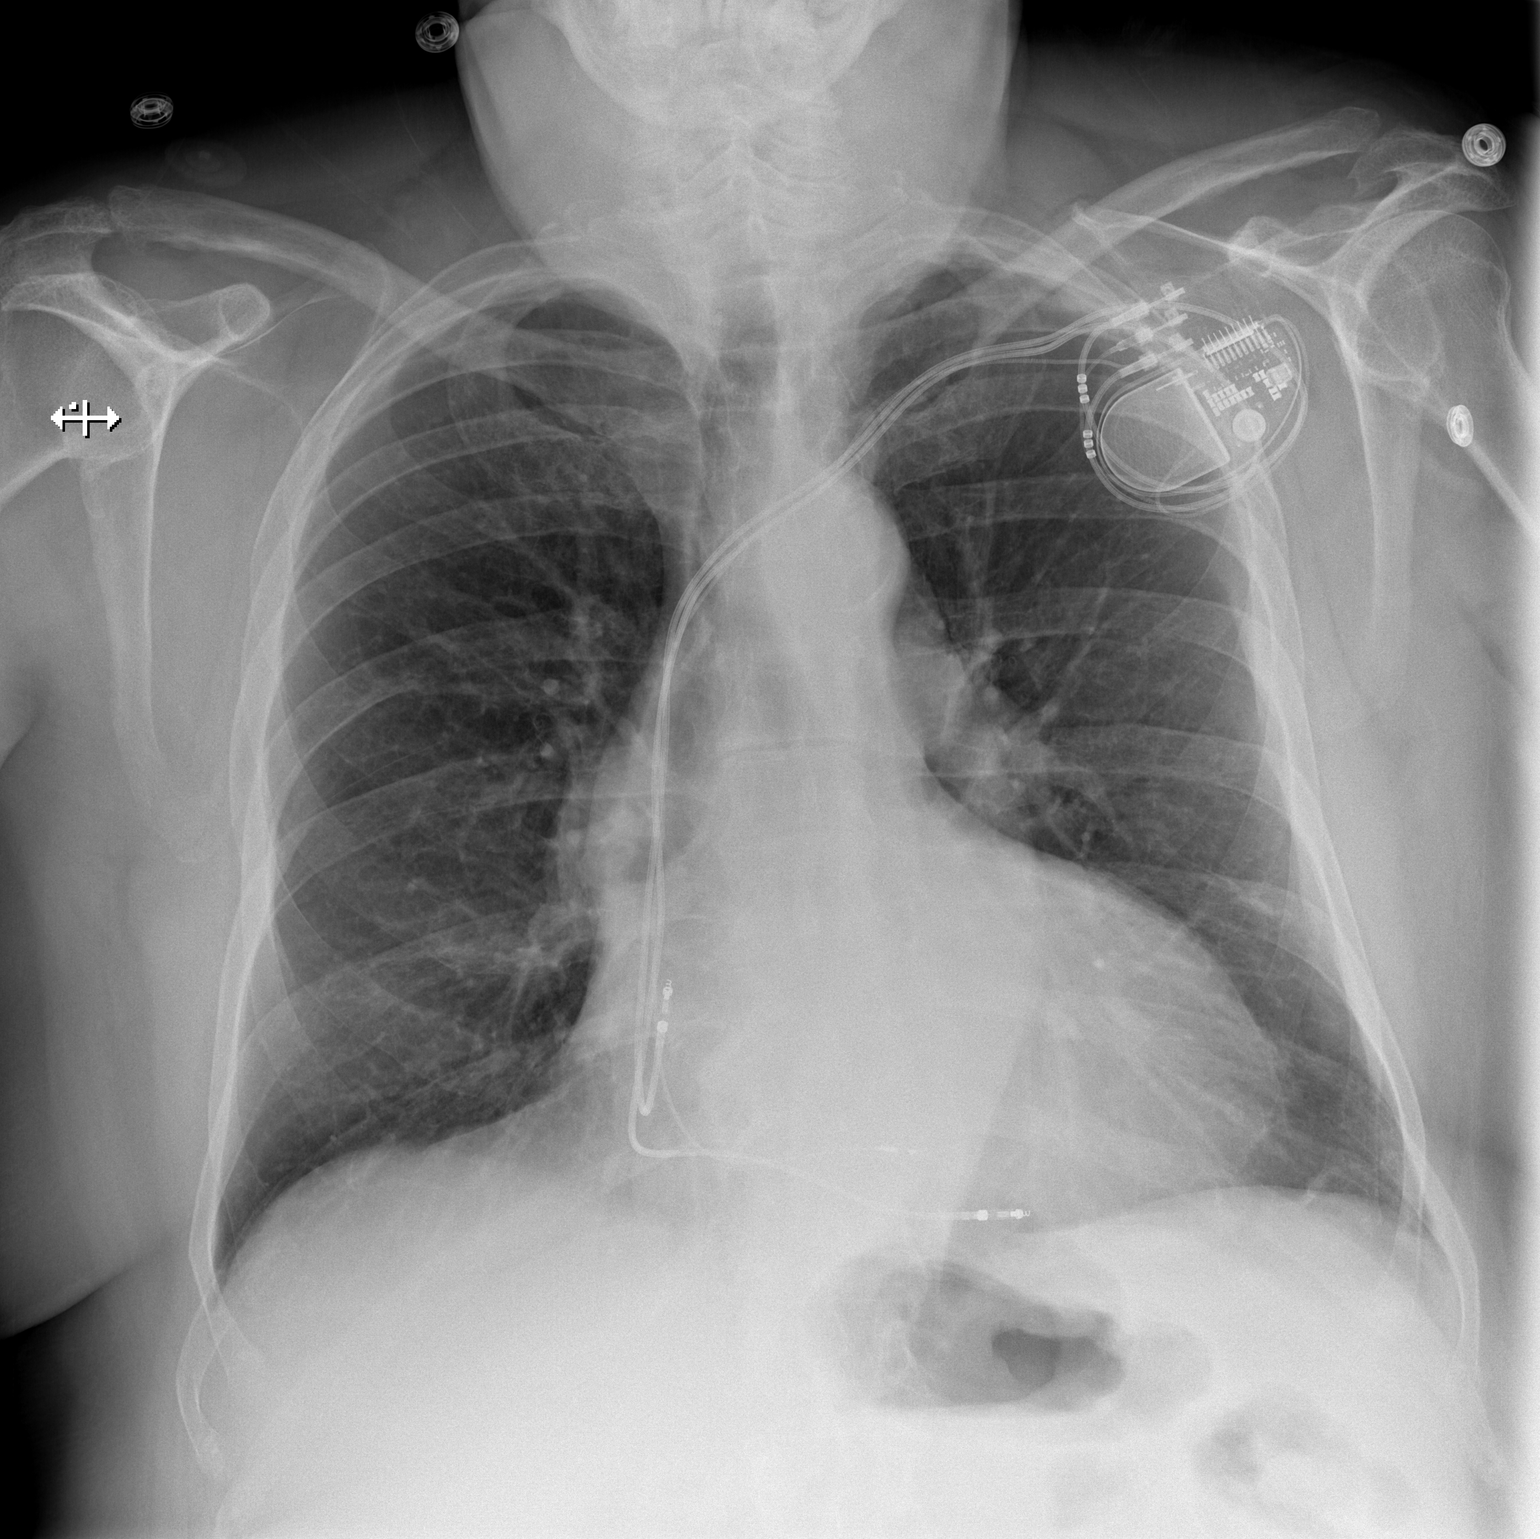

[w chest lat]
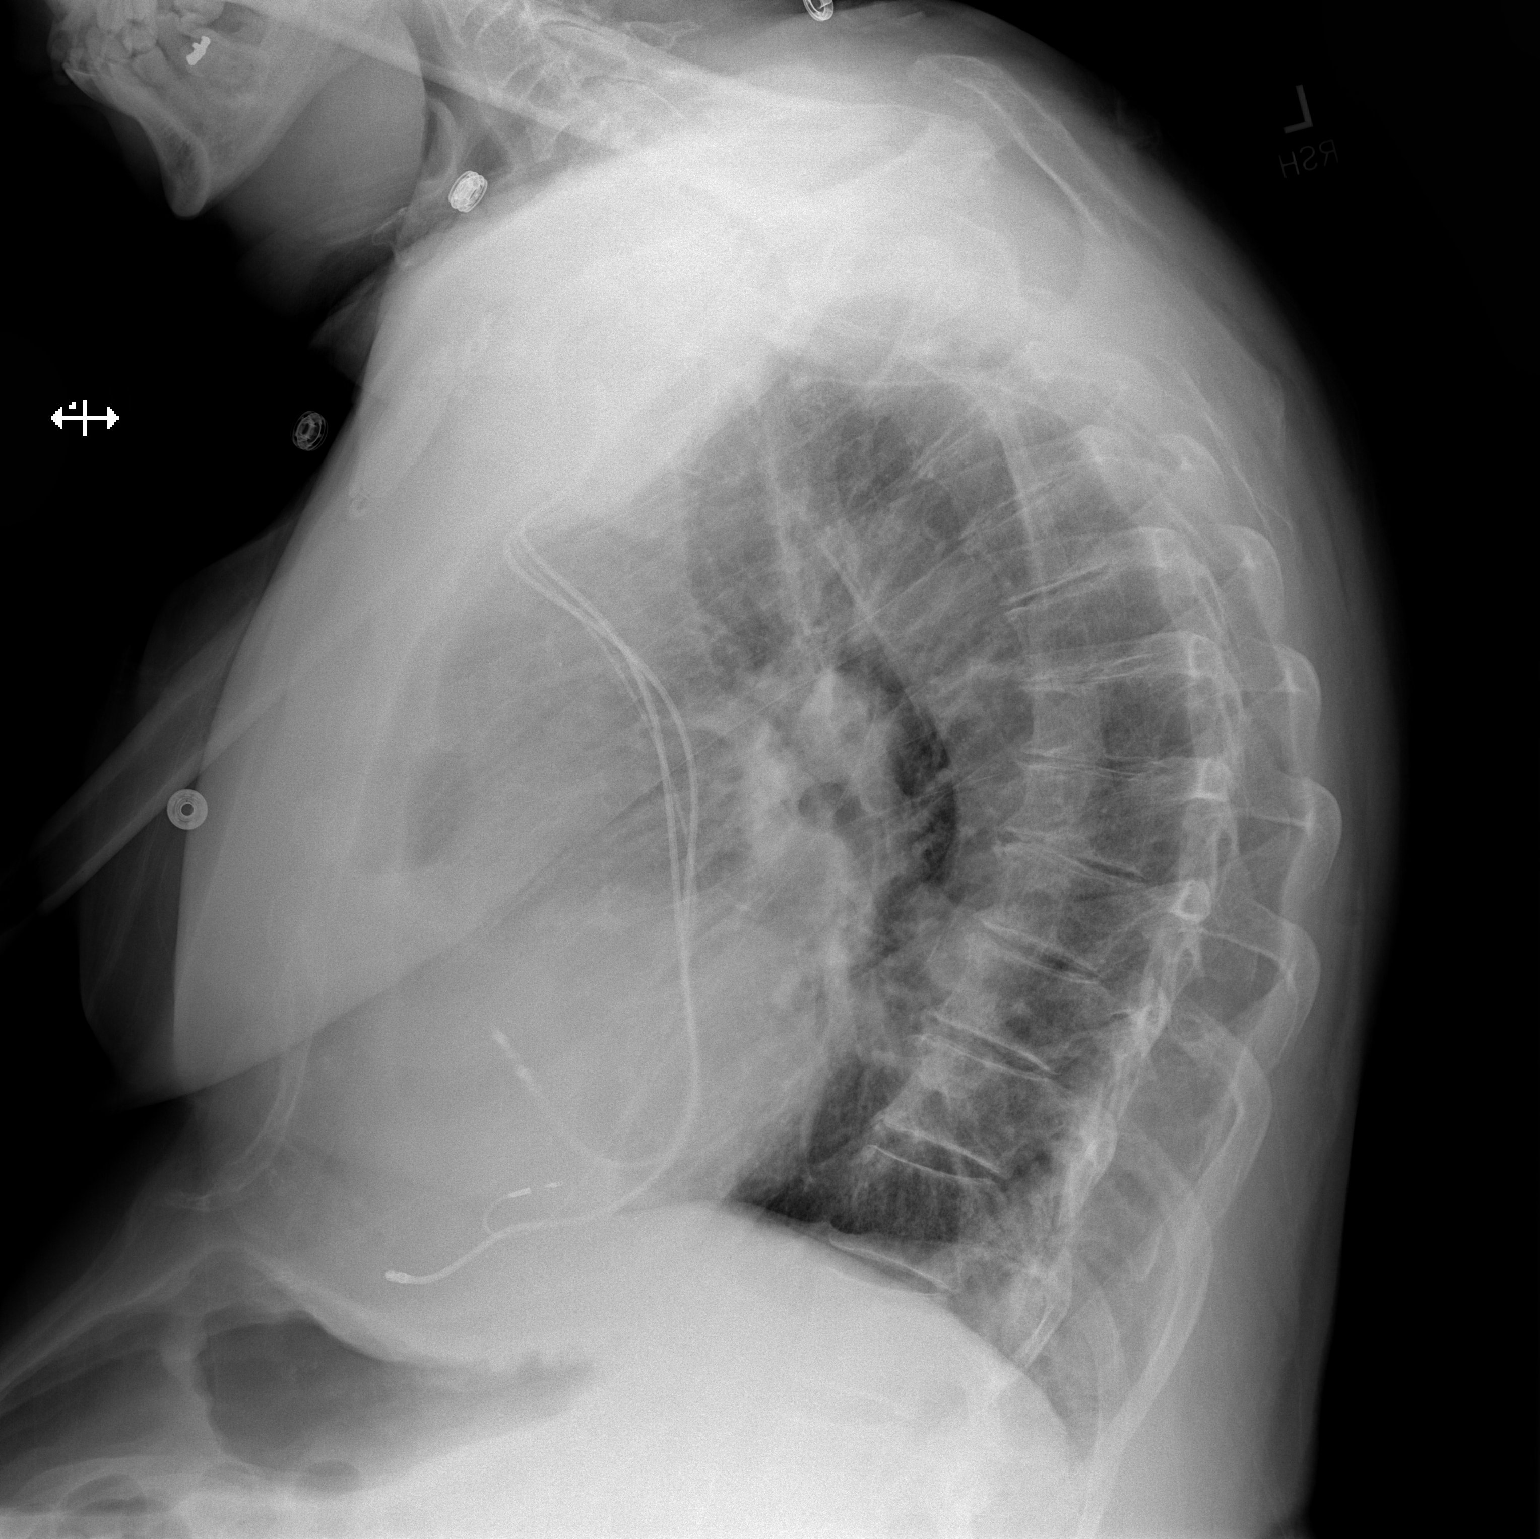

[2 of 2 positions shown; findings below may reference images not displayed]

FINDINGS: AICD in stable position. Stable cardiomegaly. No pulmonary venous
congestion. No acute infiltrate. Mild left base subsegmental
atelectasis. No pleural effusion or pneumothorax. Degenerative
change thoracic spine.
IMPRESSION: 1. AICD in stable position. Stable cardiomegaly. No pulmonary venous
congestion.

2. No acute pulmonary disease. Mild left base subsegmental
atelectasis.

## 2021-04-06 ENCOUNTER — Telehealth: Payer: Self-pay | Admitting: Cardiology

## 2021-04-06 NOTE — Telephone Encounter (Signed)
Patient's daughter called in regarding current admission at [facility]: In speaking with daughter patient was recently admitted to [facility] with what appears to be atrial fibrillation with RVR with CHF.  She describes detailed encounter regarding treatment.  Patient has undergone IV medications as well as TEE cardioversion attempts to restore sinus rhythm but have been unsuccessful so far.  She initially inquired regarding whether [facility] could be involved in patient's care.  I informed her given he was at an outside facility we are unable to assist with care at this time.  Did have detailed conversation regarding patient's treatment.  Did encourage her to continue to be patient's advocate while he is admitted.  Also informed her she could involve patient relations if she felt necessary to be involved in her father's care.  At the end of conversation she expressed feeling more comfortable with his current treatment plan.  Thanked me for follow-up call.

## 2021-04-30 ENCOUNTER — Telehealth: Payer: Self-pay

## 2021-04-30 NOTE — Telephone Encounter (Signed)
NOTES SCANNED TO REFERRAL 

## 2021-05-06 ENCOUNTER — Telehealth: Payer: Self-pay | Admitting: Internal Medicine

## 2021-05-06 NOTE — Telephone Encounter (Signed)
Pt c/o medication issue:  1. Name of Medication:  D/C at hospital visit: spironolactone (ALDACTONE) 25 MG tablet as directed sacubitril-valsartan (ENTRESTO) 24-26 MG as directed   Started at Hospital:(Daughter not sure of dosages)  Amiodarone 200 mg tablet 1 times daily Hydralazine  Apixiaban   2. How are you currently taking this medication (dosage and times per day)?   3. Are you having a reaction (difficulty breathing--STAT)?   4. What is your medication issue? Daughter of the patient called. Medications were changed during the patient's most recent hospital stay. The Daughter wanted to make sure the patient was taking the correct medications. The patient's Son is with the patient today. If the office needs more clarification, please call the patient at his house

## 2021-05-09 NOTE — Telephone Encounter (Signed)
Spoke with pt's daughter. DPR who  reports pt was discharged from [facility] about 1 months ago.  She reports hospital made multiple medication changes and pt will need refills if he is to continue on current medications.  Hospital follow up was scheduled with Dr Caryl Comes for 06/23/2021. Advised will forward information to scheduler and request earlier appointment so medications changes can be reviewed and refills provided if needed.  Pt's daughter verbalizes understanding and agrees with current plan.

## 2021-05-12 ENCOUNTER — Telehealth: Payer: Self-pay | Admitting: Internal Medicine

## 2021-05-12 NOTE — Telephone Encounter (Signed)
New message    Pt daughter has concerns about medication that was given in the hospital and states pt has gained a pound since yesterday.   Hydralazine and Amiodarone were prescribed at [facility] and pt is almost out with no refills. Daughter concerned if it's ok if pt misses meds because they will be out before appt on Thursday 01.12.23

## 2021-05-12 NOTE — Telephone Encounter (Signed)
Attempted phone call to pt's daughter, DPR and left voicemail message to contact RN at [phone].

## 2021-05-13 NOTE — Telephone Encounter (Signed)
Attempted phone call to [place], [place].  Left voicemail message to contact RN at [phone].

## 2021-05-14 NOTE — Progress Notes (Signed)
Cardiology Office Note Date:  05/14/2021  Patient ID:  Ricky Hayden, DOB 1945-10-19, MRN [id number] PCP:  [facility]  [facility]  [facility]: Dr. Haroldine Laws EP: Dr. Caryl Comes    Chief Complaint:  post hospital  History of Present Illness: Ricky Hayden is a 76 y.o. 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 Caryl Comes, last seen by him Jan 2022, at that visit he was doing well, much improved with aggressive HF management via HF clinic, planned to follow at the [facility] and see [place] PRN.  04/06/21: Pt's daughter called, reporting him currently admitted with new Afib and strategies they had been using so far unsuccessful (meds, TEE/DCCV reportedly) asked if we could assist, advised we could not given he was not at [facility] facility.  Admitted to [facility] 04/03/21 Went with CP, palpitations Found in an SVT >> ultimately AFlutter with reports of a recent outpt diagnosis of Afib Volume OL/acute CHF AKI/CKD (III)  Hep gtt, amio started > TEE/DCCV unsuccessful 12/3 > DCCV successful  12/8 (after diuresis) Last albs K+ 4.5 BUN/Creat 135/2.20 (01/29/20 Creat 1.46, baseline then about 1.2) WBC 15.2 H/H 14/44 Plts 157  05/12/21, called with concerns of new medications running out.  Looks like he saw/spoke, [facility]21 BUN/Creat 26/1.65 K+ 4.0 WBC 5.4 H/H 13/41 Torsemide resumed Looks like he has an appt w/[facility] EP on 06/02/21  TODAY Unclear why his visit is with [place] today, he follows with EP/cadiology at the [facility] He is doing better though still since his admission He has COPD and a chronic on/off cough, on/off production, perhaps still alittle more currently then usual Breathing is back to his baseline No CP, no palpitations No near syncope or syncope No  shocks   Device information Abbott CRT-D implanted 01/17/2020  AAD hx Amiodarone started 04/2021, afib  Past Medical History:  Diagnosis Date   Arthritis    osteoarthritis of left knee   Ascending aorta dilatation (HCC)    Balanitis    recurrent   Cardiac arrhythmia    life threatening, secondary to CCB vs b- blockers   Cardiomyopathy ([place])    Chronic joint pain    Chronic systolic CHF (congestive heart failure) ([place])    CKD (chronic kidney disease), stage III ([place])    COPD (chronic obstructive pulmonary disease) ([place])    Coronary artery disease    Dilated aortic root ([place])    Enlarged prostate    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 Sherwood Gambler   Nocturia    Obesity    Pansinusitis    a.  complicated by brain abscess and bleeding requiring craniotomy in 1995.   PONV (postoperative nausea and vomiting)    Sinusitis    s/p ethmoidectomy and nasal septoplasty   Vertigo    intermitantly    Past Surgical History:  Procedure Laterality Date   BIV UPGRADE N/A 01/29/2020   Procedure: BIV UPGRADE;  Surgeon: Evans Lance, MD;  Location: [place] [facility];  Service: Cardiovascular;  Laterality: N/A;   CIRCUMCISION N/A 11/20/2013  Procedure: CIRCUMCISION ADULT;  Surgeon: Claybon Jabs, MD;  Location: [facility];  Service: Urology;  Laterality: N/A;   [place]   hematomy due to sinus infection    PACEMAKER IMPLANT N/A 01/17/2020   Procedure: PACEMAKER IMPLANT;  Surgeon: Deboraha Sprang, MD;  Location: [place] [facility];  Service: Cardiovascular;  Laterality: N/A;   RIGHT/LEFT HEART CATH AND CORONARY ANGIOGRAPHY N/A 09/20/2019   Procedure: RIGHT/LEFT HEART CATH AND CORONARY ANGIOGRAPHY;  Surgeon: Jolaine Artist, MD;  Location: [place] [facility];  Service: Cardiovascular;   Laterality: N/A;   shoulder surg rt   1995   SINUS SURGERY WITH INSTATRAK     ethmoidectomy and nasal septum repair    Current Outpatient Medications  Medication Sig Dispense Refill   albuterol (PROVENTIL HFA;VENTOLIN HFA) 108 (90 BASE) MCG/ACT inhaler Inhale 2 puffs into the lungs every 6 (six) hours as needed for wheezing. 1 Inhaler 11   allopurinol (ZYLOPRIM) 100 MG tablet Take 2 tablets (200 mg total) by mouth daily. (Patient taking differently: Take 200 mg by mouth daily. Took 2 this am) 60 tablet 11   budesonide-formoterol (SYMBICORT) 80-4.5 MCG/ACT inhaler Inhale 2 puffs into the lungs 2 (two) times daily. 1 Inhaler 12   carvedilol (COREG) 6.25 MG tablet Take 1 tablet (6.25 mg total) by mouth 2 (two) times daily with a meal. 180 tablet 3   diclofenac Sodium (VOLTAREN) 1 % GEL Apply 2 g topically daily as needed (pain).      docusate sodium (COLACE) 100 MG capsule Take 1-2 capsules (100-200 mg total) by mouth 2 (two) times daily as needed for mild constipation. 180 capsule 3   empagliflozin (JARDIANCE) 10 MG TABS tablet Take 1 tablet (10 mg total) by mouth daily before breakfast. 90 tablet 3   finasteride (PROSCAR) 5 MG tablet TAKE ONE TABLET BY MOUTH ONCE DAILY 30 tablet 11   GINKGO BILOBA PO Take 2 tablets by mouth at bedtime.     Multiple Vitamins-Minerals (ICAPS AREDS 2 PO) Take 2 tablets by mouth daily.     omeprazole (PRILOSEC) 20 MG capsule Take 1 capsule (20 mg total) by mouth daily. 30 capsule 11   oxyCODONE-acetaminophen (PERCOCET/ROXICET) 5-325 MG tablet Take 1 tablet by mouth 2 (two) times daily as needed for moderate pain or severe pain. 6 tablet 0   PHENobarbital (LUMINAL) 64.8 MG tablet Take 1 tablet (64.8 mg total) by mouth 2 (two) times daily. 180 tablet 3   polyethylene glycol (MIRALAX / GLYCOLAX) packet Take 17 g by mouth daily as needed for moderate constipation. 14 each 0   pravastatin (PRAVACHOL) 40 MG tablet Take 1 tablet (40 mg total) by mouth every evening. 90  tablet 3   sacubitril-valsartan (ENTRESTO) 24-26 MG Take 1 tablet by mouth 2 (two) times daily. 180 tablet 3   SPIRIVA HANDIHALER 18 MCG inhalation capsule INHALE ONE DOSE BY MOUTH ONCE DAILY 30 capsule 11   spironolactone (ALDACTONE) 25 MG tablet Take 0.5 tablets (12.5 mg total) by mouth daily. 30 tablet 0   tamsulosin (FLOMAX) 0.4 MG CAPS capsule Take 1 capsule (0.4 mg total) by mouth daily. 90 capsule 3   torsemide (DEMADEX) 20 MG tablet Take two tablets in the am and one tablet in the pm. 270 tablet 3   No current facility-administered medications for this visit.    Allergies:   Calcium channel blockers   Social History:  The patient  reports that he quit smoking about 35 years ago. He has never  used smokeless tobacco. He reports that he does not drink alcohol and does not use drugs.   Family History:  The patient's family history includes Heart attack (age of onset: 68) in his father; Heart failure (age of onset: 81) in his mother.  ROS:  Please see the history of present illness.  All other systems are reviewed and otherwise negative.   PHYSICAL EXAM:  VS:  There were no vitals taken for this visit. BMI: There is no height or weight on file to calculate BMI.  A recheck on fhi sBP by myself 90/62 Well nourished, well developed, in no acute distress  HEENT: normocephalic, atraumatic  Neck: no JVD, carotid bruits or masses Cardiac:  RRR; no significant murmurs, no rubs, or gallops Lungs:  CTA b/l, b/l end exp wheezes, no rhonchi or rales  Abd: soft, nontender MS: no deformity or atrophy Ext: trace edema b/l to just above mid-shin Skin: warm and dry, no rash Neuro:  No gross deficits appreciated Psych: euthymic mood, full affect  ICD site: stable, skin is intact, no tethering, fluctuation of discomfort   EKG:  done today and reviewed by myself SR/V paced, QRS is 55ms, 1st degree avblock 234ms  Device interrogation done today and reviewed by myself Battery and lead  measurements are stable RV lead is programmed subthreshold, noting elevated thresholds today QRS is 43ms programmed as is No arrhythmias  04/03/21: limited echo Findings:          Pericardium:  No significant pericardial effusion          LV function:  Severely depressed (< 30% EF)          RV:  Normal          IVC collapsibility:  Indeterminate    May 2021: R/LHC Prox LAD to Mid LAD lesion is 25% stenosed. Prox Cx to Dist Cx lesion is 25% stenosed.  Findings:  Ao = 98/69 (81) LV = 101/24 RA = 11 RV = 59/15 PA = 64/23 (40) PCW = 19 Fick cardiac output/index = 5.6/2.6 PVR = 3.5 WU  FA sat = 94% PA sat = 64%, 70%  Assessment:  1. Minimal CAD 2. Nonischemic CM 3. Mild to moderately elevated filling pressures with normal cardiac output   2D Echo 4/21 1. Left ventricular ejection fraction, by estimation, is <20%. The left ventricle has severely decreased function. The left ventricle demonstrates global hypokinesis. The left ventricular internal cavity size was mildly dilated. There is mild concentric left ventricular hypertrophy. Left ventricular diastolic function could not be evaluated. Elevated left ventricular enddiastolic pressure. 2. Right ventricular systolic function is severely reduced. The right ventricular size is severely enlarged. There is moderately elevated pulmonary artery systolic pressure. 3. The mitral valve is normal in structure. Mild mitral valve regurgitation. No evidence of mitral stenosis. 4. The aortic valve is tricuspid. Aortic valve regurgitation is trivial. Mild to moderate aortic valve sclerosis/calcification is present, without any evidence of aortic stenosis. 5. Aortic dilatation noted. There is mild dilatation of the aortic root and of the ascending aorta measuring 41 mm and 34mm respectively. 6. The inferior vena cava is dilated in size with <50% respiratory variability, suggesting right atrial pressure of 15 mmHg. 7. Left atrial size was  severely dilated. 8. Right atrial size was mildly dilated.   Recent Labs: No results found for requested labs within last 8760 hours.  No results found for requested labs within last 8760 hours.   CrCl cannot be calculated (Patient's most recent  lab result is older than the maximum 21 days allowed.).   Wt Readings from Last 3 Encounters:  05/17/20 235 lb 3.2 oz (106.7 kg)  01/29/20 230 lb (104.3 kg)  01/17/20 226 lb (102.5 kg)     Other studies reviewed: Additional studies/records reviewed today include: summarized above  ASSESSMENT AND PLAN:  1. ICD Intact function No programming changes made Left bundle lead in LV port RV lead is programmed subthreshold (has high threshold noted today 2.5/0.5, given programmed already subthreshold, no further testing done)  2. NICM 3. Chronic CHF     Appears compensated, underlying COPD makes lung exam noisy, though symptoms remain improved     [facility]      It appeared in review of his chart prior to his visit that he had EP/cardiology follow up in place After he leaves at the time of final charting I can no longer find that apptointment or mention of it. He and his daughter with him today seem to recall seeing deice MD at the [place] before.  He would prefer seeing them financially for sure  Back on his HF meds  4. New Afib/flutter Nonce further On eliqus and amiodarone CMET and TSH today        Disposition: for now, defer to the [place], will follow up with lab results and ansure he has cardiology/EP follow up in place/available at the [facility]  Current medicines are reviewed at length with the patient today.  The patient did not have any concerns regarding medicines.  Venetia Night, PA-C 05/14/2021 5:48 AM     [facility] [address] [place] [id number] [phone] (office)  [phone] (fax)

## 2021-05-14 NOTE — Telephone Encounter (Signed)
Attempted phone call to [phone] and left voicemail message to contact RN at [phone].  Pt is scheduled to see Tommye Standard, PA-C 05/15/2021.

## 2021-05-15 ENCOUNTER — Encounter: Payer: Self-pay | Admitting: Physician Assistant

## 2021-05-15 ENCOUNTER — Ambulatory Visit (INDEPENDENT_AMBULATORY_CARE_PROVIDER_SITE_OTHER): Payer: No Typology Code available for payment source | Admitting: Physician Assistant

## 2021-05-15 ENCOUNTER — Other Ambulatory Visit: Payer: Self-pay

## 2021-05-15 VITALS — BP 116/68 | HR 81 | Ht 67.0 in | Wt 231.8 lb

## 2021-05-15 DIAGNOSIS — I428 Other cardiomyopathies: Secondary | ICD-10-CM | POA: Diagnosis not present

## 2021-05-15 DIAGNOSIS — I480 Paroxysmal atrial fibrillation: Secondary | ICD-10-CM

## 2021-05-15 DIAGNOSIS — Z79899 Other long term (current) drug therapy: Secondary | ICD-10-CM

## 2021-05-15 DIAGNOSIS — I5022 Chronic systolic (congestive) heart failure: Secondary | ICD-10-CM

## 2021-05-15 DIAGNOSIS — I442 Atrioventricular block, complete: Secondary | ICD-10-CM | POA: Diagnosis not present

## 2021-05-15 DIAGNOSIS — Z95810 Presence of automatic (implantable) cardiac defibrillator: Secondary | ICD-10-CM

## 2021-05-15 LAB — CUP PACEART INCLINIC DEVICE CHECK
Battery Remaining Longevity: 96 mo
Battery Voltage: 3.01 V
Brady Statistic RA Percent Paced: 0.07 %
Brady Statistic RV Percent Paced: 99.7 %
Date Time Interrogation Session: 20230112191823
Implantable Lead Implant Date: 20210915
Implantable Lead Implant Date: 20210915
Implantable Lead Implant Date: 20210927
Implantable Lead Location: 753858
Implantable Lead Location: 753859
Implantable Lead Location: 753860
Implantable Lead Model: 3830
Implantable Pulse Generator Implant Date: 20210915
Lead Channel Impedance Value: 450 Ohm
Lead Channel Impedance Value: 512.5 Ohm
Lead Channel Impedance Value: 562.5 Ohm
Lead Channel Pacing Threshold Amplitude: 0.5 V
Lead Channel Pacing Threshold Amplitude: 0.875 V
Lead Channel Pacing Threshold Amplitude: 2.5 V
Lead Channel Pacing Threshold Amplitude: 2.5 V
Lead Channel Pacing Threshold Pulse Width: 0.05 ms
Lead Channel Pacing Threshold Pulse Width: 0.05 ms
Lead Channel Pacing Threshold Pulse Width: 0.4 ms
Lead Channel Pacing Threshold Pulse Width: 0.4 ms
Lead Channel Sensing Intrinsic Amplitude: 12 mV
Lead Channel Sensing Intrinsic Amplitude: 3.1 mV
Lead Channel Setting Pacing Amplitude: 0.25 V
Lead Channel Setting Pacing Amplitude: 1.5 V
Lead Channel Setting Pacing Amplitude: 2 V
Lead Channel Setting Pacing Pulse Width: 0.05 ms
Lead Channel Setting Pacing Pulse Width: 0.4 ms
Lead Channel Setting Sensing Sensitivity: 2 mV
Pulse Gen Model: 3222
Pulse Gen Serial Number: 9188373

## 2021-05-15 NOTE — Patient Instructions (Addendum)
Medication Instructions:   Your physician recommends that you continue on your current medications as directed. Please refer to the Current Medication list given to you today.   *If you need a refill on your cardiac medications before your next appointment, please call your pharmacy*   Lab Work:  CMET AND  TSH TODAY   If you have labs (blood work) drawn today and your tests are completely normal, you will receive your results only by: [place] (if you have MyChart) OR A paper copy in the mail If you have any lab test that is abnormal or we need to change your treatment, we will call you to review the results.   Testing/Procedures: NONE ORDERED  TODAY      Follow-Up: At [facility], you and your health needs are our priority.  As part of our continuing mission to provide you with exceptional heart care, we have created designated Provider Care Teams.  These Care Teams include your primary Cardiologist (physician) and Advanced Practice Providers (APPs -  Physician Assistants and Nurse Practitioners) who all work together to provide you with the care you need, when you need it.  We recommend signing up for the patient portal called "MyChart".  Sign up information is provided on this After Visit Summary.  MyChart is used to connect with patients for Virtual Visits (Telemedicine).  Patients are able to view lab/test results, encounter notes, upcoming appointments, etc.  Non-urgent messages can be sent to your provider as well.   To learn more about what you can do with MyChart, go to [email or web address].    Your next appointment:   CONTACT [facility] [phone] AS NEEDED FOR  ANY CARDIAC RELATED SYMPTOMS

## 2021-05-16 ENCOUNTER — Telehealth: Payer: Self-pay | Admitting: Internal Medicine

## 2021-05-16 LAB — COMPREHENSIVE METABOLIC PANEL
ALT: 16 IU/L (ref 0–44)
AST: 18 IU/L (ref 0–40)
Albumin/Globulin Ratio: 1.3 (ref 1.2–2.2)
Albumin: 4.2 g/dL (ref 3.7–4.7)
Alkaline Phosphatase: 87 IU/L (ref 44–121)
BUN/Creatinine Ratio: 13 (ref 10–24)
BUN: 19 mg/dL (ref 8–27)
Bilirubin Total: 0.2 mg/dL (ref 0.0–1.2)
CO2: 28 mmol/L (ref 20–29)
Calcium: 9.4 mg/dL (ref 8.6–10.2)
Chloride: 100 mmol/L (ref 96–106)
Creatinine, Ser: 1.47 mg/dL — ABNORMAL HIGH (ref 0.76–1.27)
Globulin, Total: 3.2 g/dL (ref 1.5–4.5)
Glucose: 84 mg/dL (ref 70–99)
Potassium: 4.2 mmol/L (ref 3.5–5.2)
Sodium: 143 mmol/L (ref 134–144)
Total Protein: 7.4 g/dL (ref 6.0–8.5)
eGFR: 49 mL/min/{1.73_m2} — ABNORMAL LOW (ref >59)

## 2021-05-16 LAB — TSH: TSH: 4.88 u[IU]/mL — ABNORMAL HIGH (ref 0.450–4.500)

## 2021-05-16 MED ORDER — AMIODARONE HCL 200 MG PO TABS: 200.0000 mg | ORAL_TABLET | Freq: Every day | ORAL | 3 refills | Status: DC

## 2021-05-16 MED ORDER — HYDRALAZINE HCL 50 MG PO TABS: 50.0000 mg | ORAL_TABLET | Freq: Three times a day (TID) | ORAL | 3 refills | Status: DC

## 2021-05-16 NOTE — Telephone Encounter (Signed)
Pt's medication was sent to pt's pharmacy as requested. Confirmation received.  °

## 2021-05-16 NOTE — Telephone Encounter (Signed)
°*  STAT* If patient is at the pharmacy, call can be transferred to refill team.   1. Which medications need to be refilled? (please list name of each medication and dose if known) amiodarone (PACERONE) 200 MG tablet  hydrALAZINE (APRESOLINE) 50 MG tablet  2. Which pharmacy/location (including street and city if local pharmacy) is medication to be sent to? [place], [place] - [address]  3. Do they need a 30 day or 90 day supply? 90   Patient is out of medication

## 2021-05-19 ENCOUNTER — Telehealth: Payer: Self-pay | Admitting: *Deleted

## 2021-05-19 DIAGNOSIS — Z79899 Other long term (current) drug therapy: Secondary | ICD-10-CM

## 2021-05-19 NOTE — Telephone Encounter (Signed)
-----   Message from [facility], [place] sent at 05/16/2021  6:03 AM EST ----- Kidney function remains improved, continue his medicines.  TSH mildly elevated, needs to be repeated in a month   Initially when prepping for his visit yesterday it looked like he had EP/cardiology follow up with the [facility], but that appointment no longer found in the system.   We planned yesterday to see [place] PRN, though ONLY if he has follow up with the [place].  Please confirm with his daughter where they will be following for his device/cardiology.  If [place], have him see SK/EP APP in 3 mo, TSH in a month with [place] or [facility] given [facility] him

## 2021-05-19 NOTE — Telephone Encounter (Signed)
Spoke with patient Dtr [name] and aware of results and verbalize understanding. Dtr  stated patient [facility] has been release to Dr Caryl Comes based up the [facility] process. Dtr scheduled 3 month Caryl Comes and 1 Month TSH  redraw

## 2021-05-19 NOTE — Telephone Encounter (Signed)
-----   Message from [facility], [place] sent at 05/16/2021  6:03 AM EST ----- Kidney function remains improved, continue his medicines.  TSH mildly elevated, needs to be repeated in a month   Initially when prepping for his visit yesterday it looked like he had EP/cardiology follow up with the [place], but that appointment no longer found in the system.   We planned yesterday to see [place] PRN, though ONLY if he has follow up with the [place].  Please confirm with his daughter where they will be following for his device/cardiology.  If [place], have him see SK/EP APP in 3 mo, TSH in a month with [place] or [facility] Pt preferred [facility] given it is [facility] for him

## 2021-06-19 ENCOUNTER — Other Ambulatory Visit: Payer: No Typology Code available for payment source

## 2021-06-20 ENCOUNTER — Other Ambulatory Visit: Payer: Self-pay

## 2021-06-20 ENCOUNTER — Other Ambulatory Visit: Payer: No Typology Code available for payment source | Admitting: *Deleted

## 2021-06-20 DIAGNOSIS — Z79899 Other long term (current) drug therapy: Secondary | ICD-10-CM

## 2021-06-20 LAB — TSH: TSH: 4.03 u[IU]/mL (ref 0.450–4.500)

## 2021-06-23 ENCOUNTER — Encounter: Payer: No Typology Code available for payment source | Admitting: Internal Medicine

## 2021-07-03 ENCOUNTER — Other Ambulatory Visit (HOSPITAL_COMMUNITY): Payer: Self-pay | Admitting: Cardiology

## 2021-07-03 MED ORDER — EMPAGLIFLOZIN 10 MG PO TABS: 10.0000 mg | ORAL_TABLET | Freq: Every day | ORAL | 3 refills | Status: DC

## 2021-07-03 MED ORDER — TORSEMIDE 20 MG PO TABS: ORAL_TABLET | ORAL | 3 refills | Status: DC

## 2021-08-19 ENCOUNTER — Encounter: Payer: No Typology Code available for payment source | Admitting: Internal Medicine

## 2021-08-23 ENCOUNTER — Ambulatory Visit
Admission: RE | Admit: 2021-08-23 | Discharge: 2021-08-23 | Disposition: A | Discharge status: Home / Self Care | Payer: No Typology Code available for payment source | Source: Ambulatory Visit | Attending: Urgent Care | Admitting: Urgent Care

## 2021-08-23 VITALS — BP 119/64 | HR 73 | Temp 97.7°F | Resp 20

## 2021-08-23 DIAGNOSIS — N390 Urinary tract infection, site not specified: Secondary | ICD-10-CM | POA: Diagnosis not present

## 2021-08-23 LAB — POCT URINALYSIS DIP (MANUAL ENTRY)
Bilirubin, UA: NEGATIVE
Glucose, UA: 500 mg/dL — AB
Ketones, POC UA: NEGATIVE mg/dL
Nitrite, UA: NEGATIVE
Protein Ur, POC: 30 mg/dL — AB
Spec Grav, UA: 1.015 (ref 1.010–1.025)
Urobilinogen, UA: 0.2 E.U./dL
pH, UA: 7 (ref 5.0–8.0)

## 2021-08-23 MED ORDER — SULFAMETHOXAZOLE-TRIMETHOPRIM 400-80 MG PO TABS: 1.0000 | ORAL_TABLET | Freq: Two times a day (BID) | ORAL | 0 refills | Status: DC

## 2021-08-23 NOTE — ED Provider Notes (Signed)
?[facility] ? ? ? ?CSN: [id number] ?Arrival date & time: 08/23/21  1345 ? ? ?  ? ?History   ?Chief Complaint ?Chief Complaint  ?Patient presents with  ? Urinary Retention  ?  Blood in urine - Entered by patient  ? urinary situation  ? ? ?HPI ?Ricky Hayden is a 76 y.o. male.  ? ?76 year old male patient accompanied by his son presents today due to concerns of a recurrent UTI.  He apparently was hospitalized in early December 2022, and had a catheter.  At that time he was diagnosed with a UTI, culture showed Proteus Mirabella's.  He was started on antibiotics and states that it seemed to have improved.  Shortly after completing the antibiotics, symptoms returned.  He states in the past 3 months he has been on 3 different antibiotics, and symptoms seem to improve but then return.  He is on tamsulosin and finasteride daily for known history of BPH.  He states over the past several days he has had a return of urinary symptoms including frequency, hesitancy and dysuria.  He presented today primarily concerned about a blood clot he saw in his urine last evening.  He denies any further blood in his urine today, but states it looked "orange".  He does follow with a nephrologist, he is currently taking Jardiance prescribed by his cardiologist.  He denies a history of diabetes.  Patient does admit to some discomfort in his perennial region, but denies fever, flank pain, nausea, vomiting, abdominal or pelvic pain.  He reports he has a urology appointment on Monday. ? ? ? ?Past Medical History:  ?Diagnosis Date  ? Arthritis   ? osteoarthritis of left knee  ? Ascending aorta dilatation (HCC)   ? 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 Cardiac arrhythmia   ? life threatening, secondary to CCB vs b- blockers  ? Cardiomyopathy ([place])   ? Chronic joint pain   ? Chronic systolic CHF (congestive heart failure) ([place])   ? CKD (chronic kidney disease), stage III ([place])   ? COPD (chronic obstructive pulmonary disease) ([place])   ? Coronary  artery disease   ? Dilated aortic root ([place])   ? Enlarged prostate   ? 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 GERD (gastroesophageal reflux disease)   ? Gout   ? Hiatal hernia   ? Hyperlipidemia   ? Hypertension   ? Intracranial hematoma ([place]) 1995  ? history of, s/p evacuation by Dr. Sherwood Gambler  ? Nocturia   ? Obesity   ? Pansinusitis   ? a.  complicated by brain abscess and bleeding requiring craniotomy in 1995.  ? PONV (postoperative nausea and vomiting)   ? Sinusitis   ? s/p ethmoidectomy and nasal septoplasty  ? Vertigo   ? intermitantly  ? ? ?Patient Active Problem List  ? Diagnosis Date Noted  ? Presence of biventricular cardiac pacemaker 05/15/2020  ? PVC (premature ventricular contraction) 12/11/2019  ? NICM (nonischemic cardiomyopathy) ([place]) 12/11/2019  ? Chronic CHF (congestive heart failure) ([place]) 12/11/2019  ? Acute hypoxemic respiratory failure due to COVID-19 [facility]) 08/12/2019  ? CKD (chronic kidney disease) stage 3, GFR 30-59 ml/min (HCC) 08/12/2019  ? COPD with acute exacerbation ([place]) 08/12/2019  ? Hyperkalemia   ? Acute renal injury ([place]) 10/21/2018  ? Respiratory tract congestion with cough 06/18/2015  ? Chronic combined systolic and diastolic CHF, NYHA class 2 ([place]) 06/10/2015  ? Leg swelling 07/16/2014  ? Preventative health care 03/07/2014  ?  COPD (chronic obstructive pulmonary disease) ([place]) 09/11/2013  ? BPPV (benign paroxysmal positional vertigo) 10/05/2012  ? BPH (benign prostatic hyperplasia) 01/10/2008  ? Gout 04/22/2006  ? ERECTILE DYSFUNCTION 04/22/2006  ? 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 Hyperlipidemia 03/15/2006  ? Essential hypertension 03/15/2006  ? GERD 03/15/2006  ? HIATAL HERNIA 03/15/2006  ? OSTEOARTHRITIS 03/15/2006  ? 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 ? ?Past Surgical History:  ?Procedure Laterality Date  ? BIV UPGRADE N/A 01/29/2020  ? Procedure: BIV UPGRADE;  Surgeon: Evans Lance, MD;  Location: [facility];  Service: Cardiovascular;   Laterality: N/A;  ? CIRCUMCISION N/A 11/20/2013  ? Procedure: CIRCUMCISION ADULT;  Surgeon: Claybon Jabs, MD;  Location: [facility];  Service: Urology;  Laterality: N/A;  ? CRANIOTOMY  1995  ? hematomy due to sinus infection   ? PACEMAKER IMPLANT N/A 01/17/2020  ? Procedure: PACEMAKER IMPLANT;  Surgeon: Deboraha Sprang, MD;  Location: [facility];  Service: Cardiovascular;  Laterality: N/A;  ? RIGHT/LEFT HEART CATH AND CORONARY ANGIOGRAPHY N/A 09/20/2019  ? Procedure: RIGHT/LEFT HEART CATH AND CORONARY ANGIOGRAPHY;  Surgeon: Jolaine Artist, MD;  Location: [facility];  Service: Cardiovascular;  Laterality: N/A;  ? shoulder surg rt   1995  ? SINUS SURGERY WITH INSTATRAK    ? ethmoidectomy and nasal septum repair  ? ? ? ? ? ?Home Medications   ? ?Prior to Admission medications   ?Medication Sig Start Date End Date Taking? Authorizing Provider  ?sulfamethoxazole-trimethoprim (BACTRIM) 400-80 MG tablet Take 1 tablet by mouth 2 (two) times daily for 20 days. 08/23/21 09/12/21 Yes Aleatha Taite L, PA  ?albuterol (PROVENTIL HFA;VENTOLIN HFA) 108 (90 BASE) MCG/ACT inhaler Inhale 2 puffs into the lungs every 6 (six) hours as needed for wheezing. 12/10/14   Maryellen Pile, MD  ?amiodarone (PACERONE) 200 MG tablet Take 1 tablet (200 mg total) by mouth daily. 05/16/21   Deboraha Sprang, MD  ?apixaban (ELIQUIS) 5 MG TABS tablet Take 1 tablet by mouth in the morning and at bedtime. 03/26/21   [provider]  ?budesonide-formoterol (SYMBICORT) 80-4.5 MCG/ACT inhaler Inhale 2 puffs into the lungs 2 (two) times daily. 09/17/15   Corky Sox, MD  ?carvedilol (COREG) 6.25 MG tablet Take 1 tablet (6.25 mg total) by mouth 2 (two) times daily with a meal. 01/22/20   Bensimhon, Shaune Pascal, MD  ?cetirizine (ZYRTEC) 10 MG tablet Take 1 tablet by mouth daily. 01/23/21   [provider]  ?diclofenac Sodium (VOLTAREN) 1 % GEL Apply 2 g topically daily as needed (pain).     [provider]  ?docusate sodium  (COLACE) 100 MG capsule Take 1-2 capsules (100-200 mg total) by mouth 2 (two) times daily as needed for mild constipation. 03/05/14   Kelby Aline, MD  ?empagliflozin (JARDIANCE) 10 MG TABS tablet Take 1 tablet (10 mg total) by mouth daily before breakfast. 07/03/21   Bensimhon, Shaune Pascal, MD  ?finasteride (PROSCAR) 5 MG tablet TAKE ONE TABLET BY MOUTH ONCE DAILY 03/05/15   Maryellen Pile, MD  ?Evelina Bucy BILOBA PO Take 2 tablets by mouth at bedtime.    [provider]  ?hydrALAZINE (APRESOLINE) 50 MG tablet Take 1 tablet (50 mg total) by mouth every 8 (eight) hours. 05/16/21   Deboraha Sprang, MD  ?Multiple Vitamins-Minerals (ICAPS AREDS 2 PO) Take 2 tablets by mouth daily.    [provider]  ?omeprazole (PRILOSEC) 20 MG capsule Take 1 capsule (20 mg total)  by mouth daily. 12/10/14   Maryellen Pile, MD  ?PHENobarbital (LUMINAL) 64.8 MG tablet Take 1 tablet (64.8 mg total) by mouth 2 (two) times daily. 07/24/15   Corky Sox, MD  ?polyethylene glycol [facility] Floria Raveling) packet Take 17 g by mouth daily as needed for moderate constipation. 12/10/14   Maryellen Pile, MD  ?pravastatin (PRAVACHOL) 40 MG tablet Take 1 tablet (40 mg total) by mouth every evening. 12/10/14   Maryellen Pile, MD  ?sacubitril-valsartan (ENTRESTO) 24-26 MG Take 1 tablet by mouth 2 (two) times daily. 01/22/20   Bensimhon, Shaune Pascal, MD  ?SPIRIVA HANDIHALER 18 MCG inhalation capsule INHALE ONE DOSE BY MOUTH ONCE DAILY 09/27/15   Maryellen Pile, MD  ?spironolactone (ALDACTONE) 25 MG tablet Take 0.5 tablets (12.5 mg total) by mouth daily. 08/19/19   Ghimire, Henreitta Leber, MD  ?tamsulosin (FLOMAX) 0.4 MG CAPS capsule Take 1 capsule (0.4 mg total) by mouth daily. 03/05/15   Maryellen Pile, MD  ?torsemide (DEMADEX) 20 MG tablet Take two tablets in the am and one tablet in the pm. 07/03/21   Bensimhon, Shaune Pascal, MD  ? ? ?Family History ?Family History  ?Problem Relation Age of Onset  ? Heart failure Mother 70  ? Heart attack Father 29  ? ? ?Social  History ?Social History  ? ?Tobacco Use  ? Smoking status: Former  ?  Types: Cigarettes  ?  Quit date: 05/04/1986  ?  Years since quitting: 35.3  ? Smokeless tobacco: Never  ?Vaping Use  ? Vaping Use: Never [place]

## 2021-08-23 NOTE — ED Triage Notes (Signed)
Pt c/o hematuria. States yesterday he noticed a clot discharge from penis and blood began excreting. States was in hospital in Dec and retained a CAUTI that has been treated multiple times without full resolution. Associated BUY:[id number], dysuria, urgency, unable to start voiding,  ? ?Denies pelvic or new lower back pain.  ? ?States was supposed to see Urology on Monday but sxs have gotten worse so office recommended pt be seen by ED or UC.  ?

## 2021-08-23 NOTE — Discharge Instructions (Addendum)
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  This can mimic a urinary tract infection. ?Please start taking the antibiotic twice daily.  Prostate infections are usually treated for 20 to 28 days. ?We sent your urine out for culture. ?Please keep your appointment with your urologist on Monday. ?Should you see any more blood in the urine, or if you spike a fever, please had to the emergency room immediately. ?Please call your cardiologist on Monday and discuss possibly stopping Jardiance as this may also be a factor. ?

## 2021-08-27 LAB — URINE CULTURE: Culture: 40000 — AB

## 2021-08-28 ENCOUNTER — Inpatient Hospital Stay (HOSPITAL_COMMUNITY)
Admission: EM | Admit: 2021-08-28 | Discharge: 2021-09-19 | DRG: 870 | Disposition: A | Discharge status: Rehabilitation Facility | Payer: No Typology Code available for payment source | Attending: Family Medicine | Admitting: Family Medicine

## 2021-08-28 DIAGNOSIS — J9601 Acute respiratory failure with hypoxia: Principal | ICD-10-CM

## 2021-08-28 DIAGNOSIS — Z7984 Long term (current) use of oral hypoglycemic drugs: Secondary | ICD-10-CM

## 2021-08-28 DIAGNOSIS — E785 Hyperlipidemia, unspecified: Secondary | ICD-10-CM | POA: Diagnosis present

## 2021-08-28 DIAGNOSIS — J449 Chronic obstructive pulmonary disease, unspecified: Secondary | ICD-10-CM | POA: Diagnosis present

## 2021-08-28 DIAGNOSIS — M1712 Unilateral primary osteoarthritis, left knee: Secondary | ICD-10-CM | POA: Diagnosis present

## 2021-08-28 DIAGNOSIS — N183 Chronic kidney disease, stage 3 unspecified: Secondary | ICD-10-CM | POA: Diagnosis present

## 2021-08-28 DIAGNOSIS — I77810 Thoracic aortic ectasia: Secondary | ICD-10-CM | POA: Diagnosis present

## 2021-08-28 DIAGNOSIS — R410 Disorientation, unspecified: Secondary | ICD-10-CM

## 2021-08-28 DIAGNOSIS — I468 Cardiac arrest due to other underlying condition: Secondary | ICD-10-CM | POA: Diagnosis not present

## 2021-08-28 DIAGNOSIS — I5043 Acute on chronic combined systolic (congestive) and diastolic (congestive) heart failure: Secondary | ICD-10-CM | POA: Diagnosis present

## 2021-08-28 DIAGNOSIS — Z7901 Long term (current) use of anticoagulants: Secondary | ICD-10-CM

## 2021-08-28 DIAGNOSIS — R7881 Bacteremia: Secondary | ICD-10-CM | POA: Diagnosis present

## 2021-08-28 DIAGNOSIS — G9340 Encephalopathy, unspecified: Secondary | ICD-10-CM | POA: Diagnosis not present

## 2021-08-28 DIAGNOSIS — Z79899 Other long term (current) drug therapy: Secondary | ICD-10-CM

## 2021-08-28 DIAGNOSIS — Z888 Allergy status to other drugs, medicaments and biological substances status: Secondary | ICD-10-CM

## 2021-08-28 DIAGNOSIS — Z95810 Presence of automatic (implantable) cardiac defibrillator: Secondary | ICD-10-CM

## 2021-08-28 DIAGNOSIS — N390 Urinary tract infection, site not specified: Secondary | ICD-10-CM | POA: Diagnosis present

## 2021-08-28 DIAGNOSIS — A4159 Other Gram-negative sepsis: Secondary | ICD-10-CM | POA: Diagnosis not present

## 2021-08-28 DIAGNOSIS — G9341 Metabolic encephalopathy: Secondary | ICD-10-CM | POA: Diagnosis not present

## 2021-08-28 DIAGNOSIS — I5042 Chronic combined systolic (congestive) and diastolic (congestive) heart failure: Secondary | ICD-10-CM | POA: Diagnosis present

## 2021-08-28 DIAGNOSIS — E875 Hyperkalemia: Secondary | ICD-10-CM | POA: Diagnosis not present

## 2021-08-28 DIAGNOSIS — A419 Sepsis, unspecified organism: Secondary | ICD-10-CM | POA: Diagnosis present

## 2021-08-28 DIAGNOSIS — R6521 Severe sepsis with septic shock: Secondary | ICD-10-CM | POA: Diagnosis not present

## 2021-08-28 DIAGNOSIS — K626 Ulcer of anus and rectum: Secondary | ICD-10-CM | POA: Diagnosis present

## 2021-08-28 DIAGNOSIS — B964 Proteus (mirabilis) (morganii) as the cause of diseases classified elsewhere: Secondary | ICD-10-CM | POA: Diagnosis present

## 2021-08-28 DIAGNOSIS — I10 Essential (primary) hypertension: Secondary | ICD-10-CM | POA: Diagnosis present

## 2021-08-28 DIAGNOSIS — Z950 Presence of cardiac pacemaker: Secondary | ICD-10-CM | POA: Diagnosis present

## 2021-08-28 DIAGNOSIS — I130 Hypertensive heart and chronic kidney disease with heart failure and stage 1 through stage 4 chronic kidney disease, or unspecified chronic kidney disease: Secondary | ICD-10-CM | POA: Diagnosis present

## 2021-08-28 DIAGNOSIS — E1165 Type 2 diabetes mellitus with hyperglycemia: Secondary | ICD-10-CM | POA: Diagnosis not present

## 2021-08-28 DIAGNOSIS — Z9889 Other specified postprocedural states: Secondary | ICD-10-CM

## 2021-08-28 DIAGNOSIS — N200 Calculus of kidney: Secondary | ICD-10-CM

## 2021-08-28 DIAGNOSIS — L89326 Pressure-induced deep tissue damage of left buttock: Secondary | ICD-10-CM | POA: Diagnosis not present

## 2021-08-28 DIAGNOSIS — G8929 Other chronic pain: Secondary | ICD-10-CM | POA: Diagnosis present

## 2021-08-28 DIAGNOSIS — N170 Acute kidney failure with tubular necrosis: Secondary | ICD-10-CM | POA: Diagnosis present

## 2021-08-28 DIAGNOSIS — Z6835 Body mass index (BMI) 35.0-35.9, adult: Secondary | ICD-10-CM

## 2021-08-28 DIAGNOSIS — E870 Hyperosmolality and hypernatremia: Secondary | ICD-10-CM | POA: Diagnosis not present

## 2021-08-28 DIAGNOSIS — Z8249 Family history of ischemic heart disease and other diseases of the circulatory system: Secondary | ICD-10-CM

## 2021-08-28 DIAGNOSIS — K219 Gastro-esophageal reflux disease without esophagitis: Secondary | ICD-10-CM | POA: Diagnosis present

## 2021-08-28 DIAGNOSIS — Z87440 Personal history of urinary (tract) infections: Secondary | ICD-10-CM

## 2021-08-28 DIAGNOSIS — Z7189 Other specified counseling: Secondary | ICD-10-CM

## 2021-08-28 DIAGNOSIS — L89152 Pressure ulcer of sacral region, stage 2: Secondary | ICD-10-CM | POA: Diagnosis not present

## 2021-08-28 DIAGNOSIS — L89316 Pressure-induced deep tissue damage of right buttock: Secondary | ICD-10-CM | POA: Diagnosis not present

## 2021-08-28 DIAGNOSIS — Z9981 Dependence on supplemental oxygen: Secondary | ICD-10-CM

## 2021-08-28 DIAGNOSIS — K5900 Constipation, unspecified: Secondary | ICD-10-CM | POA: Diagnosis not present

## 2021-08-28 DIAGNOSIS — L24A2 Irritant contact dermatitis due to fecal, urinary or dual incontinence: Secondary | ICD-10-CM | POA: Diagnosis present

## 2021-08-28 DIAGNOSIS — Z20822 Contact with and (suspected) exposure to covid-19: Secondary | ICD-10-CM | POA: Diagnosis present

## 2021-08-28 DIAGNOSIS — I469 Cardiac arrest, cause unspecified: Secondary | ICD-10-CM

## 2021-08-28 DIAGNOSIS — N400 Enlarged prostate without lower urinary tract symptoms: Secondary | ICD-10-CM | POA: Diagnosis present

## 2021-08-28 DIAGNOSIS — Z7951 Long term (current) use of inhaled steroids: Secondary | ICD-10-CM

## 2021-08-28 DIAGNOSIS — M109 Gout, unspecified: Secondary | ICD-10-CM | POA: Diagnosis present

## 2021-08-28 DIAGNOSIS — I428 Other cardiomyopathies: Secondary | ICD-10-CM

## 2021-08-28 DIAGNOSIS — I480 Paroxysmal atrial fibrillation: Secondary | ICD-10-CM

## 2021-08-28 DIAGNOSIS — Z634 Disappearance and death of family member: Secondary | ICD-10-CM

## 2021-08-28 DIAGNOSIS — I2510 Atherosclerotic heart disease of native coronary artery without angina pectoris: Secondary | ICD-10-CM | POA: Diagnosis present

## 2021-08-28 DIAGNOSIS — N1832 Chronic kidney disease, stage 3b: Secondary | ICD-10-CM | POA: Diagnosis present

## 2021-08-28 DIAGNOSIS — J9621 Acute and chronic respiratory failure with hypoxia: Secondary | ICD-10-CM | POA: Diagnosis present

## 2021-08-28 DIAGNOSIS — G40909 Epilepsy, unspecified, not intractable, without status epilepticus: Secondary | ICD-10-CM

## 2021-08-28 DIAGNOSIS — J690 Pneumonitis due to inhalation of food and vomit: Secondary | ICD-10-CM | POA: Diagnosis not present

## 2021-08-28 DIAGNOSIS — N12 Tubulo-interstitial nephritis, not specified as acute or chronic: Secondary | ICD-10-CM | POA: Diagnosis present

## 2021-08-28 DIAGNOSIS — R319 Hematuria, unspecified: Secondary | ICD-10-CM | POA: Diagnosis present

## 2021-08-28 DIAGNOSIS — E1122 Type 2 diabetes mellitus with diabetic chronic kidney disease: Secondary | ICD-10-CM | POA: Diagnosis present

## 2021-08-28 DIAGNOSIS — Z87891 Personal history of nicotine dependence: Secondary | ICD-10-CM

## 2021-08-28 DIAGNOSIS — D649 Anemia, unspecified: Secondary | ICD-10-CM

## 2021-08-28 DIAGNOSIS — R579 Shock, unspecified: Secondary | ICD-10-CM

## 2021-08-28 DIAGNOSIS — N179 Acute kidney failure, unspecified: Secondary | ICD-10-CM | POA: Diagnosis present

## 2021-08-28 DIAGNOSIS — J9622 Acute and chronic respiratory failure with hypercapnia: Secondary | ICD-10-CM | POA: Diagnosis present

## 2021-08-28 DIAGNOSIS — D6959 Other secondary thrombocytopenia: Secondary | ICD-10-CM | POA: Diagnosis not present

## 2021-08-28 MED ORDER — SODIUM CHLORIDE 0.9 % IV SOLN
2.0000 g | INTRAVENOUS | Status: DC
  Administered 2021-08-29 – 2021-08-31 (×4): 2 g via INTRAVENOUS
  Filled 2021-08-28 (×4): qty 20

## 2021-08-28 MED ORDER — SODIUM CHLORIDE 0.9 % IV SOLN
500.0000 mg | INTRAVENOUS | Status: DC
  Administered 2021-08-29: 500 mg via INTRAVENOUS
  Filled 2021-08-28: qty 5

## 2021-08-28 MED ORDER — LACTATED RINGERS IV SOLN: INTRAVENOUS | Status: DC

## 2021-08-28 NOTE — ED Triage Notes (Signed)
BIB [facility] from home. 1 week increasing SOB worsening tonight. Hx CHF COPD. Found to be low 80's RA- EMS- 1 duo ned, 125 solumedrol, mag, CPAP. ?

## 2021-08-29 ENCOUNTER — Encounter (HOSPITAL_COMMUNITY): Payer: Self-pay | Admitting: Internal Medicine

## 2021-08-29 ENCOUNTER — Inpatient Hospital Stay (HOSPITAL_COMMUNITY): Payer: No Typology Code available for payment source

## 2021-08-29 ENCOUNTER — Other Ambulatory Visit: Payer: Self-pay

## 2021-08-29 ENCOUNTER — Emergency Department (HOSPITAL_COMMUNITY): Payer: No Typology Code available for payment source

## 2021-08-29 DIAGNOSIS — N1832 Chronic kidney disease, stage 3b: Secondary | ICD-10-CM | POA: Diagnosis present

## 2021-08-29 DIAGNOSIS — J9690 Respiratory failure, unspecified, unspecified whether with hypoxia or hypercapnia: Secondary | ICD-10-CM | POA: Diagnosis not present

## 2021-08-29 DIAGNOSIS — B964 Proteus (mirabilis) (morganii) as the cause of diseases classified elsewhere: Secondary | ICD-10-CM | POA: Diagnosis not present

## 2021-08-29 DIAGNOSIS — I5042 Chronic combined systolic (congestive) and diastolic (congestive) heart failure: Secondary | ICD-10-CM | POA: Diagnosis not present

## 2021-08-29 DIAGNOSIS — N390 Urinary tract infection, site not specified: Secondary | ICD-10-CM | POA: Diagnosis present

## 2021-08-29 DIAGNOSIS — I77810 Thoracic aortic ectasia: Secondary | ICD-10-CM | POA: Diagnosis present

## 2021-08-29 DIAGNOSIS — I4892 Unspecified atrial flutter: Secondary | ICD-10-CM | POA: Diagnosis not present

## 2021-08-29 DIAGNOSIS — I480 Paroxysmal atrial fibrillation: Secondary | ICD-10-CM | POA: Diagnosis not present

## 2021-08-29 DIAGNOSIS — E1165 Type 2 diabetes mellitus with hyperglycemia: Secondary | ICD-10-CM | POA: Diagnosis not present

## 2021-08-29 DIAGNOSIS — J9601 Acute respiratory failure with hypoxia: Secondary | ICD-10-CM | POA: Diagnosis not present

## 2021-08-29 DIAGNOSIS — R6520 Severe sepsis without septic shock: Secondary | ICD-10-CM | POA: Diagnosis not present

## 2021-08-29 DIAGNOSIS — R5381 Other malaise: Secondary | ICD-10-CM | POA: Diagnosis not present

## 2021-08-29 DIAGNOSIS — R000 Tachycardia, unspecified: Secondary | ICD-10-CM | POA: Diagnosis not present

## 2021-08-29 DIAGNOSIS — J9611 Chronic respiratory failure with hypoxia: Secondary | ICD-10-CM | POA: Diagnosis not present

## 2021-08-29 DIAGNOSIS — I5021 Acute systolic (congestive) heart failure: Secondary | ICD-10-CM | POA: Diagnosis not present

## 2021-08-29 DIAGNOSIS — J9622 Acute and chronic respiratory failure with hypercapnia: Secondary | ICD-10-CM | POA: Diagnosis present

## 2021-08-29 DIAGNOSIS — D6959 Other secondary thrombocytopenia: Secondary | ICD-10-CM | POA: Diagnosis not present

## 2021-08-29 DIAGNOSIS — R319 Hematuria, unspecified: Secondary | ICD-10-CM | POA: Diagnosis present

## 2021-08-29 DIAGNOSIS — Z20822 Contact with and (suspected) exposure to covid-19: Secondary | ICD-10-CM | POA: Diagnosis present

## 2021-08-29 DIAGNOSIS — E8809 Other disorders of plasma-protein metabolism, not elsewhere classified: Secondary | ICD-10-CM | POA: Diagnosis not present

## 2021-08-29 DIAGNOSIS — A419 Sepsis, unspecified organism: Secondary | ICD-10-CM | POA: Diagnosis present

## 2021-08-29 DIAGNOSIS — I428 Other cardiomyopathies: Secondary | ICD-10-CM | POA: Diagnosis present

## 2021-08-29 DIAGNOSIS — G9341 Metabolic encephalopathy: Secondary | ICD-10-CM | POA: Diagnosis not present

## 2021-08-29 DIAGNOSIS — N1831 Chronic kidney disease, stage 3a: Secondary | ICD-10-CM | POA: Diagnosis not present

## 2021-08-29 DIAGNOSIS — N179 Acute kidney failure, unspecified: Secondary | ICD-10-CM | POA: Diagnosis not present

## 2021-08-29 DIAGNOSIS — E870 Hyperosmolality and hypernatremia: Secondary | ICD-10-CM | POA: Diagnosis not present

## 2021-08-29 DIAGNOSIS — I4819 Other persistent atrial fibrillation: Secondary | ICD-10-CM | POA: Diagnosis not present

## 2021-08-29 DIAGNOSIS — N12 Tubulo-interstitial nephritis, not specified as acute or chronic: Secondary | ICD-10-CM | POA: Diagnosis present

## 2021-08-29 DIAGNOSIS — I468 Cardiac arrest due to other underlying condition: Secondary | ICD-10-CM | POA: Diagnosis not present

## 2021-08-29 DIAGNOSIS — N170 Acute kidney failure with tubular necrosis: Secondary | ICD-10-CM | POA: Diagnosis present

## 2021-08-29 DIAGNOSIS — I5022 Chronic systolic (congestive) heart failure: Secondary | ICD-10-CM | POA: Diagnosis not present

## 2021-08-29 DIAGNOSIS — G40909 Epilepsy, unspecified, not intractable, without status epilepticus: Secondary | ICD-10-CM | POA: Diagnosis present

## 2021-08-29 DIAGNOSIS — E1169 Type 2 diabetes mellitus with other specified complication: Secondary | ICD-10-CM | POA: Diagnosis not present

## 2021-08-29 DIAGNOSIS — R410 Disorientation, unspecified: Secondary | ICD-10-CM | POA: Diagnosis not present

## 2021-08-29 DIAGNOSIS — A4159 Other Gram-negative sepsis: Secondary | ICD-10-CM | POA: Diagnosis present

## 2021-08-29 DIAGNOSIS — L89152 Pressure ulcer of sacral region, stage 2: Secondary | ICD-10-CM | POA: Diagnosis not present

## 2021-08-29 DIAGNOSIS — N400 Enlarged prostate without lower urinary tract symptoms: Secondary | ICD-10-CM | POA: Diagnosis not present

## 2021-08-29 DIAGNOSIS — Z8616 Personal history of COVID-19: Secondary | ICD-10-CM | POA: Diagnosis not present

## 2021-08-29 DIAGNOSIS — I10 Essential (primary) hypertension: Secondary | ICD-10-CM | POA: Diagnosis not present

## 2021-08-29 DIAGNOSIS — I5043 Acute on chronic combined systolic (congestive) and diastolic (congestive) heart failure: Secondary | ICD-10-CM | POA: Diagnosis present

## 2021-08-29 DIAGNOSIS — L89316 Pressure-induced deep tissue damage of right buttock: Secondary | ICD-10-CM | POA: Diagnosis not present

## 2021-08-29 DIAGNOSIS — J449 Chronic obstructive pulmonary disease, unspecified: Secondary | ICD-10-CM | POA: Diagnosis present

## 2021-08-29 DIAGNOSIS — Z95810 Presence of automatic (implantable) cardiac defibrillator: Secondary | ICD-10-CM | POA: Diagnosis not present

## 2021-08-29 DIAGNOSIS — J9621 Acute and chronic respiratory failure with hypoxia: Secondary | ICD-10-CM | POA: Diagnosis not present

## 2021-08-29 DIAGNOSIS — G9340 Encephalopathy, unspecified: Secondary | ICD-10-CM | POA: Diagnosis present

## 2021-08-29 DIAGNOSIS — R6521 Severe sepsis with septic shock: Secondary | ICD-10-CM | POA: Diagnosis not present

## 2021-08-29 DIAGNOSIS — I959 Hypotension, unspecified: Secondary | ICD-10-CM | POA: Diagnosis not present

## 2021-08-29 DIAGNOSIS — I4821 Permanent atrial fibrillation: Secondary | ICD-10-CM | POA: Diagnosis not present

## 2021-08-29 DIAGNOSIS — R579 Shock, unspecified: Secondary | ICD-10-CM | POA: Diagnosis not present

## 2021-08-29 DIAGNOSIS — L24A2 Irritant contact dermatitis due to fecal, urinary or dual incontinence: Secondary | ICD-10-CM | POA: Diagnosis present

## 2021-08-29 DIAGNOSIS — I495 Sick sinus syndrome: Secondary | ICD-10-CM | POA: Diagnosis not present

## 2021-08-29 DIAGNOSIS — Z6835 Body mass index (BMI) 35.0-35.9, adult: Secondary | ICD-10-CM | POA: Diagnosis not present

## 2021-08-29 DIAGNOSIS — L89326 Pressure-induced deep tissue damage of left buttock: Secondary | ICD-10-CM | POA: Diagnosis not present

## 2021-08-29 DIAGNOSIS — Z1321 Encounter for screening for nutritional disorder: Secondary | ICD-10-CM | POA: Diagnosis not present

## 2021-08-29 DIAGNOSIS — I130 Hypertensive heart and chronic kidney disease with heart failure and stage 1 through stage 4 chronic kidney disease, or unspecified chronic kidney disease: Secondary | ICD-10-CM | POA: Diagnosis present

## 2021-08-29 DIAGNOSIS — N3001 Acute cystitis with hematuria: Secondary | ICD-10-CM | POA: Diagnosis not present

## 2021-08-29 DIAGNOSIS — R7881 Bacteremia: Secondary | ICD-10-CM | POA: Diagnosis not present

## 2021-08-29 DIAGNOSIS — K626 Ulcer of anus and rectum: Secondary | ICD-10-CM | POA: Diagnosis present

## 2021-08-29 DIAGNOSIS — Z7189 Other specified counseling: Secondary | ICD-10-CM | POA: Diagnosis not present

## 2021-08-29 DIAGNOSIS — Z9981 Dependence on supplemental oxygen: Secondary | ICD-10-CM | POA: Diagnosis not present

## 2021-08-29 DIAGNOSIS — N183 Chronic kidney disease, stage 3 unspecified: Secondary | ICD-10-CM | POA: Diagnosis not present

## 2021-08-29 DIAGNOSIS — J690 Pneumonitis due to inhalation of food and vomit: Secondary | ICD-10-CM | POA: Diagnosis not present

## 2021-08-29 LAB — CBC WITH DIFFERENTIAL/PLATELET
Abs Immature Granulocytes: 0.08 K/uL — ABNORMAL HIGH (ref 0.00–0.07)
Abs Immature Granulocytes: 0.12 10*3/uL — ABNORMAL HIGH (ref 0.00–0.07)
Basophils Absolute: 0 10*3/uL (ref 0.0–0.1)
Basophils Absolute: 0 K/uL (ref 0.0–0.1)
Basophils Relative: 0 %
Basophils Relative: 0 %
Eosinophils Absolute: 0 10*3/uL (ref 0.0–0.5)
Eosinophils Absolute: 0 K/uL (ref 0.0–0.5)
Eosinophils Relative: 0 %
Eosinophils Relative: 0 %
HCT: 42.4 % (ref 39.0–52.0)
HCT: 44.5 % (ref 39.0–52.0)
Hemoglobin: 13.9 g/dL (ref 13.0–17.0)
Hemoglobin: 14.6 g/dL (ref 13.0–17.0)
Immature Granulocytes: 1 %
Immature Granulocytes: 2 %
Lymphocytes Relative: 7 %
Lymphocytes Relative: 8 %
Lymphs Abs: 0.6 10*3/uL — ABNORMAL LOW (ref 0.7–4.0)
Lymphs Abs: 0.6 K/uL — ABNORMAL LOW (ref 0.7–4.0)
MCH: 31.2 pg (ref 26.0–34.0)
MCH: 31.3 pg (ref 26.0–34.0)
MCHC: 32.8 g/dL (ref 30.0–36.0)
MCHC: 32.8 g/dL (ref 30.0–36.0)
MCV: 95.3 fL (ref 80.0–100.0)
MCV: 95.3 fL (ref 80.0–100.0)
Monocytes Absolute: 0.5 10*3/uL (ref 0.1–1.0)
Monocytes Absolute: 0.5 K/uL (ref 0.1–1.0)
Monocytes Relative: 6 %
Monocytes Relative: 6 %
Neutro Abs: 6.4 10*3/uL (ref 1.7–7.7)
Neutro Abs: 7.4 K/uL (ref 1.7–7.7)
Neutrophils Relative %: 84 %
Neutrophils Relative %: 86 %
Platelets: 114 10*3/uL — ABNORMAL LOW (ref 150–400)
Platelets: 97 K/uL — ABNORMAL LOW (ref 150–400)
RBC: 4.45 MIL/uL (ref 4.22–5.81)
RBC: 4.67 MIL/uL (ref 4.22–5.81)
RDW: 13.2 % (ref 11.5–15.5)
RDW: 13.3 % (ref 11.5–15.5)
WBC: 7.6 10*3/uL (ref 4.0–10.5)
WBC: 8.5 K/uL (ref 4.0–10.5)
nRBC: 0 % (ref 0.0–0.2)
nRBC: 0 % (ref 0.0–0.2)

## 2021-08-29 LAB — COMPREHENSIVE METABOLIC PANEL
ALT: 20 U/L (ref 0–44)
AST: 21 U/L (ref 15–41)
Albumin: 3.6 g/dL (ref 3.5–5.0)
Alkaline Phosphatase: 62 U/L (ref 38–126)
Anion gap: 8 (ref 5–15)
BUN: 29 mg/dL — ABNORMAL HIGH (ref 8–23)
CO2: 23 mmol/L (ref 22–32)
Calcium: 8.8 mg/dL — ABNORMAL LOW (ref 8.9–10.3)
Chloride: 105 mmol/L (ref 98–111)
Creatinine, Ser: 2.52 mg/dL — ABNORMAL HIGH (ref 0.61–1.24)
GFR, Estimated: 26 mL/min — ABNORMAL LOW (ref >60)
Glucose, Bld: 154 mg/dL — ABNORMAL HIGH (ref 70–99)
Potassium: 4.2 mmol/L (ref 3.5–5.1)
Sodium: 136 mmol/L (ref 135–145)
Total Bilirubin: 0.7 mg/dL (ref 0.3–1.2)
Total Protein: 7.5 g/dL (ref 6.5–8.1)

## 2021-08-29 LAB — I-STAT ARTERIAL BLOOD GAS, ED
Acid-Base Excess: 3 mmol/L — ABNORMAL HIGH (ref 0.0–2.0)
Bicarbonate: 28.1 mmol/L — ABNORMAL HIGH (ref 20.0–28.0)
Calcium, Ion: 1.19 mmol/L (ref 1.15–1.40)
HCT: 39 % (ref 39.0–52.0)
Hemoglobin: 13.3 g/dL (ref 13.0–17.0)
O2 Saturation: 98 %
Patient temperature: 103.3
Potassium: 3.8 mmol/L (ref 3.5–5.1)
Sodium: 138 mmol/L (ref 135–145)
TCO2: 29 mmol/L (ref 22–32)
pCO2 arterial: 49.8 mmHg — ABNORMAL HIGH (ref 32–48)
pH, Arterial: 7.37 (ref 7.35–7.45)
pO2, Arterial: 120 mmHg — ABNORMAL HIGH (ref 83–108)

## 2021-08-29 LAB — APTT
aPTT: 34 seconds (ref 24–36)
aPTT: 39 seconds — ABNORMAL HIGH (ref 24–36)

## 2021-08-29 LAB — HEPATIC FUNCTION PANEL
ALT: 18 U/L (ref 0–44)
AST: 21 U/L (ref 15–41)
Albumin: 3.2 g/dL — ABNORMAL LOW (ref 3.5–5.0)
Alkaline Phosphatase: 55 U/L (ref 38–126)
Bilirubin, Direct: 0.2 mg/dL (ref 0.0–0.2)
Indirect Bilirubin: 0.5 mg/dL (ref 0.3–0.9)
Total Bilirubin: 0.7 mg/dL (ref 0.3–1.2)
Total Protein: 6.9 g/dL (ref 6.5–8.1)

## 2021-08-29 LAB — PROTIME-INR
INR: 1.5 — ABNORMAL HIGH (ref 0.8–1.2)
Prothrombin Time: 18.1 seconds — ABNORMAL HIGH (ref 11.4–15.2)

## 2021-08-29 LAB — BASIC METABOLIC PANEL WITH GFR
Anion gap: 9 (ref 5–15)
BUN: 29 mg/dL — ABNORMAL HIGH (ref 8–23)
CO2: 22 mmol/L (ref 22–32)
Calcium: 8.6 mg/dL — ABNORMAL LOW (ref 8.9–10.3)
Chloride: 104 mmol/L (ref 98–111)
Creatinine, Ser: 2.43 mg/dL — ABNORMAL HIGH (ref 0.61–1.24)
GFR, Estimated: 27 mL/min — ABNORMAL LOW (ref >60)
Glucose, Bld: 154 mg/dL — ABNORMAL HIGH (ref 70–99)
Potassium: 4 mmol/L (ref 3.5–5.1)
Sodium: 135 mmol/L (ref 135–145)

## 2021-08-29 LAB — ECHOCARDIOGRAM COMPLETE
AR max vel: 4.56 cm2
AV Area VTI: 4.12 cm2
AV Area mean vel: 4.11 cm2
AV Mean grad: 4 mmHg
AV Peak grad: 6.6 mmHg
Ao pk vel: 1.28 m/s
Area-P 1/2: 4.39 cm2
Height: 67 in
S' Lateral: 4.6 cm
Weight: 3782.4 oz

## 2021-08-29 LAB — URINALYSIS, ROUTINE W REFLEX MICROSCOPIC
Bilirubin Urine: NEGATIVE
Glucose, UA: >=500 mg/dL — AB
Ketones, ur: NEGATIVE mg/dL
Nitrite: NEGATIVE
Protein, ur: 30 mg/dL — AB
RBC / HPF: >50 RBC/hpf — ABNORMAL HIGH (ref 0–5)
Specific Gravity, Urine: 1.009 (ref 1.005–1.030)
WBC, UA: >50 WBC/hpf — ABNORMAL HIGH (ref 0–5)
pH: 6 (ref 5.0–8.0)

## 2021-08-29 LAB — HEPARIN LEVEL (UNFRACTIONATED): Heparin Unfractionated: >1.1 IU/mL — ABNORMAL HIGH (ref 0.30–0.70)

## 2021-08-29 LAB — I-STAT VENOUS BLOOD GAS, ED
Acid-Base Excess: 3 mmol/L — ABNORMAL HIGH (ref 0.0–2.0)
Bicarbonate: 25.7 mmol/L (ref 20.0–28.0)
Calcium, Ion: 1.08 mmol/L — ABNORMAL LOW (ref 1.15–1.40)
HCT: 43 % (ref 39.0–52.0)
Hemoglobin: 14.6 g/dL (ref 13.0–17.0)
O2 Saturation: 98 %
Potassium: 4 mmol/L (ref 3.5–5.1)
Sodium: 137 mmol/L (ref 135–145)
TCO2: 27 mmol/L (ref 22–32)
pCO2, Ven: 31.8 mmHg — ABNORMAL LOW (ref 44–60)
pH, Ven: 7.515 — ABNORMAL HIGH (ref 7.25–7.43)
pO2, Ven: 95 mmHg — ABNORMAL HIGH (ref 32–45)

## 2021-08-29 LAB — I-STAT CHEM 8, ED
BUN: 31 mg/dL — ABNORMAL HIGH (ref 8–23)
Calcium, Ion: 1.06 mmol/L — ABNORMAL LOW (ref 1.15–1.40)
Chloride: 104 mmol/L (ref 98–111)
Creatinine, Ser: 2.5 mg/dL — ABNORMAL HIGH (ref 0.61–1.24)
Glucose, Bld: 157 mg/dL — ABNORMAL HIGH (ref 70–99)
HCT: 44 % (ref 39.0–52.0)
Hemoglobin: 15 g/dL (ref 13.0–17.0)
Potassium: 4 mmol/L (ref 3.5–5.1)
Sodium: 137 mmol/L (ref 135–145)
TCO2: 24 mmol/L (ref 22–32)

## 2021-08-29 LAB — BRAIN NATRIURETIC PEPTIDE: B Natriuretic Peptide: 87.6 pg/mL (ref 0.0–100.0)

## 2021-08-29 LAB — LACTIC ACID, PLASMA
Lactic Acid, Venous: 0.9 mmol/L (ref 0.5–1.9)
Lactic Acid, Venous: 1 mmol/L (ref 0.5–1.9)

## 2021-08-29 LAB — RESP PANEL BY RT-PCR (FLU A&B, COVID) ARPGX2
Influenza A by PCR: NEGATIVE
Influenza B by PCR: NEGATIVE
SARS Coronavirus 2 by RT PCR: NEGATIVE

## 2021-08-29 LAB — TROPONIN I (HIGH SENSITIVITY)
Troponin I (High Sensitivity): 41 ng/L — ABNORMAL HIGH (ref <18)
Troponin I (High Sensitivity): 51 ng/L — ABNORMAL HIGH (ref <18)

## 2021-08-29 MED ORDER — MOMETASONE FURO-FORMOTEROL FUM 100-5 MCG/ACT IN AERO
2.0000 | INHALATION_SPRAY | Freq: Two times a day (BID) | RESPIRATORY_TRACT | Status: DC
  Administered 2021-08-29 – 2021-09-01 (×7): 2 via RESPIRATORY_TRACT
  Filled 2021-08-29: qty 8.8

## 2021-08-29 MED ORDER — ACETAMINOPHEN 325 MG PO TABS
650.0000 mg | ORAL_TABLET | Freq: Four times a day (QID) | ORAL | Status: DC | PRN
  Administered 2021-08-29 – 2021-09-01 (×7): 650 mg via ORAL
  Filled 2021-08-29 (×7): qty 2

## 2021-08-29 MED ORDER — PANTOPRAZOLE SODIUM 40 MG PO TBEC
40.0000 mg | DELAYED_RELEASE_TABLET | Freq: Every day | ORAL | Status: DC
  Administered 2021-08-29 – 2021-09-02 (×5): 40 mg via ORAL
  Filled 2021-08-29 (×5): qty 1

## 2021-08-29 MED ORDER — ACETAMINOPHEN 650 MG RE SUPP: 650.0000 mg | Freq: Four times a day (QID) | RECTAL | Status: DC | PRN

## 2021-08-29 MED ORDER — CARVEDILOL 6.25 MG PO TABS
6.2500 mg | ORAL_TABLET | Freq: Two times a day (BID) | ORAL | Status: DC
  Administered 2021-08-29 – 2021-09-02 (×9): 6.25 mg via ORAL
  Filled 2021-08-29 (×10): qty 1

## 2021-08-29 MED ORDER — PRAVASTATIN SODIUM 40 MG PO TABS
40.0000 mg | ORAL_TABLET | Freq: Every evening | ORAL | Status: DC
  Administered 2021-08-29 – 2021-09-01 (×4): 40 mg via ORAL
  Filled 2021-08-29 (×4): qty 1

## 2021-08-29 MED ORDER — HYDRALAZINE HCL 50 MG PO TABS
50.0000 mg | ORAL_TABLET | Freq: Three times a day (TID) | ORAL | Status: DC
  Administered 2021-08-29 – 2021-09-03 (×13): 50 mg via ORAL
  Filled 2021-08-29 (×13): qty 1

## 2021-08-29 MED ORDER — PHENOBARBITAL 32.4 MG PO TABS
64.8000 mg | ORAL_TABLET | Freq: Two times a day (BID) | ORAL | Status: DC
  Administered 2021-08-29 – 2021-09-02 (×10): 64.8 mg via ORAL
  Filled 2021-08-29 (×10): qty 2

## 2021-08-29 MED ORDER — HEPARIN (PORCINE) 25000 UT/250ML-% IV SOLN
2650.0000 [IU]/h | INTRAVENOUS | Status: DC
  Administered 2021-08-29: 1500 [IU]/h via INTRAVENOUS
  Administered 2021-08-30: 1700 [IU]/h via INTRAVENOUS
  Administered 2021-08-30: 1900 [IU]/h via INTRAVENOUS
  Administered 2021-08-31: 2300 [IU]/h via INTRAVENOUS
  Administered 2021-08-31: 2000 [IU]/h via INTRAVENOUS
  Administered 2021-09-01: 2650 [IU]/h via INTRAVENOUS
  Filled 2021-08-29 (×6): qty 250

## 2021-08-29 MED ORDER — PERFLUTREN LIPID MICROSPHERE
1.0000 mL | INTRAVENOUS | Status: AC | PRN
  Administered 2021-08-29: 3 mL via INTRAVENOUS
  Filled 2021-08-29: qty 10

## 2021-08-29 MED ORDER — AMIODARONE HCL 200 MG PO TABS
200.0000 mg | ORAL_TABLET | Freq: Every day | ORAL | Status: DC
  Administered 2021-08-29 – 2021-09-02 (×5): 200 mg via ORAL
  Filled 2021-08-29 (×5): qty 1

## 2021-08-29 MED ORDER — ALBUTEROL SULFATE (2.5 MG/3ML) 0.083% IN NEBU
3.0000 mL | INHALATION_SOLUTION | Freq: Four times a day (QID) | RESPIRATORY_TRACT | Status: DC | PRN
  Administered 2021-08-30: 3 mL via RESPIRATORY_TRACT
  Filled 2021-08-29: qty 3

## 2021-08-29 MED ORDER — FINASTERIDE 5 MG PO TABS
5.0000 mg | ORAL_TABLET | Freq: Every day | ORAL | Status: DC
  Administered 2021-08-29 – 2021-09-02 (×5): 5 mg via ORAL
  Filled 2021-08-29 (×6): qty 1

## 2021-08-29 MED ORDER — METOPROLOL TARTRATE 5 MG/5ML IV SOLN: 2.5000 mg | Freq: Four times a day (QID) | INTRAVENOUS | Status: DC | PRN

## 2021-08-29 NOTE — Progress Notes (Signed)
ANTICOAGULATION CONSULT NOTE - Initial Consult ? ?Pharmacy Consult for heparin ?Indication: atrial fibrillation ? ?Allergies  ?Allergen Reactions  ? Calcium Channel Blockers Other (See Comments)  ?  Came to hospital in 1995-caused chest pain ?  ? ? ?Patient Measurements: ?Height: 5\' 7"  (170.2 cm) ?Weight: 107.2 kg (236 lb 6.4 oz) ?IBW/kg (Calculated) : 66.1 ?Heparin Dosing Weight: 90kg ? ?Vital Signs: ?Temp: 98.8 ?F (37.1 ?C) (04/28 7262) ?Temp Source: Oral (04/28 0355) ?BP: 115/86 (04/28 9741) ?Pulse Rate: 69 (04/28 0625) ? ?Labs: ?Recent Labs  ?  08/28/21 ?2347 08/28/21 ?2358 08/29/21 ?0102  ?HGB 14.6 14.6  15.0 13.3  ?HCT 44.5 43.0  44.0 39.0  ?PLT 114*  --   --   ?APTT 34  --   --   ?LABPROT 18.1*  --   --   ?INR 1.5*  --   --   ?CREATININE 2.52* 2.50*  --   ?TROPONINIHS 41*  --   --   ? ? ?Estimated Creatinine Clearance: 28.9 mL/min (A) (by C-G formula based on SCr of 2.5 mg/dL (H)). ? ? ?Medical History: ?Past Medical History:  ?Diagnosis Date  ? Arthritis   ? osteoarthritis of left knee  ? Ascending aorta dilatation (HCC)   ? 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 Cardiac arrhythmia   ? life threatening, secondary to CCB vs b- blockers  ? Cardiomyopathy ([place])   ? Chronic joint pain   ? Chronic systolic CHF (congestive heart failure) ([place])   ? CKD (chronic kidney disease), stage III ([place])   ? COPD (chronic obstructive pulmonary disease) ([place])   ? Coronary artery disease   ? Dilated aortic root ([place])   ? Enlarged prostate   ? 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 GERD (gastroesophageal reflux disease)   ? Gout   ? Hiatal hernia   ? Hyperlipidemia   ? Hypertension   ? Intracranial hematoma ([place]) 1995  ? history of, s/p evacuation by Dr. Sherwood Gambler  ? Nocturia   ? Obesity   ? Pansinusitis   ? a.  complicated by brain abscess and bleeding requiring craniotomy in 1995.  ? PONV (postoperative nausea and vomiting)   ? Sinusitis   ? s/p ethmoidectomy and nasal septoplasty  ? Vertigo   ? intermitantly   ? ? ?Assessment: ?76yo male c/o worsening SOB, admitted for possible sepsis d/t Proteus UTI, also endorses hematuria >> to transition from Eliquis for Afib to heparin; last dose of Eliquis taken 4/27 1900. ? ?Goal of Therapy:  ?Heparin level 0.3-0.5 units/ml ?aPTT 66-85 seconds ?Monitor platelets by anticoagulation protocol: Yes ?  ?Plan:  ?Heparin infusion at 1500 units/hr. ?Monitor heparin levels, aPTT (while Eliquis affects anti-Xa), and CBC. ? ?Wynona Neat, PharmD, BCPS  ?08/29/2021,6:39 AM ? ? ?

## 2021-08-29 NOTE — Progress Notes (Signed)
? ?PROGRESS NOTE ? ?Ricky Hayden OEU:[id number] DOB: 1945/08/20 DOA: 08/28/2021 ?PCP: [facility] ? ?HPI/Recap of past 24 hours: ?Ricky Hayden is a 76 y.o. 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  Had gone to the urgent care about a week ago and was prescribed Bactrim.  Urine cultures show Proteus mirabilis.  Last evening at home patient started having fever and chills and shortness of breath.  Was brought to the ER. ?  ?ED Course: Initially patient required BiPAP for shortness of breath but was eventually weaned off.  Patient had a fever 103 ?F with UA showing features concerning for UTI.  Had blood cultures urine cultures drawn and started on empiric antibiotics admitted for sepsis from UTI.  Chest x-ray was unremarkable. ? ?08/29/21: Patient was seen and examined at bedside.  Reports his breathing is improved.  Has had intermittent hematuria.  Urology consulted. ? ?Assessment/Plan: ?Principal Problem: ?  Sepsis ([place]) ?Active Problems: ?  Essential hypertension ?  BPH (benign prostatic hyperplasia) ?  COPD (chronic obstructive pulmonary disease) ([place]) ?  Chronic combined systolic and diastolic CHF, NYHA class 2 ([place]) ?  NICM (nonischemic cardiomyopathy) ([place]) ?  Presence of biventricular cardiac pacemaker ?  UTI (urinary tract infection) ?  ARF (acute renal failure) ([place]) ? ?Possible developing sepsis from urinary tract infection for which patient is on ceftriaxone.  Follow cultures.  We will hold off further fluids given the severe CHF.  Patient scrotal area does look mildly erythematous.  No tenderness on exam.  Will get CT renal study to further assess patient's hematuria and UTI.  Nonobstructive kidney stone.  Urology consulted Dr. Cain Sieve. ?Acute on chronic kidney disease stage III creatinine has worsened from 1.4 in January 2023 started  on 2.5.  Could be possibly from recent use of Bactrim.  Patient did receive fluids in the ER.  We will hold off further fluids given history of severe CHF.  Hold patient's diuretics and Entresto until patient's creatinine improves. ?History of A-fib usually takes Eliquis.  Holding Eliquis due to patient giving history of hematuria.  We will keep patient on heparin.  If hematuria persists will need to [facility] urology. ?History of chronic systolic 15-40% and diastolic CHF status post ICD placement appears compensated.  Did receive fluids in the ER for possible developing sepsis.  We will hold off further fluids.  If creatinine improves restart patient's torsemide spironolactone and Entresto.  Patient is also on Coreg. ?COPD not actively wheezing.  Uses home oxygen at bedtime. ?Hyperlipidemia on statins. 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 ? ?  ?DVT prophylaxis: Heparin. ?Code Status: Full code.  Patient's son is going to reconfirm this in the morning. ?Family Communication: Patient's son. ?Disposition Plan: Home when stable. ?Consults called: None. ?Admission status: Inpatient. ?  ?  ? ? ?Status is: Inpatient ? ? ? ? ?Objective: ?Vitals:  ? 08/29/21 0518 08/29/21 0545 08/29/21 0867 08/29/21 0740  ?BP:  115/64 115/86 131/72  ?Pulse:  67 69 72  ?Resp:  18 16 16   ?Temp:   98.8 ?F (37.1 ?C) (!) 97.5 ?F (36.4 ?C)  ?TempSrc:   Oral Oral  ?SpO2: 94% 95% 96% 95%  ?Weight:   107.2 kg   ?Height:   5\' 7"  (1.702 m)   ? ? ?Intake/Output Summary (Last 24 hours) at 08/29/2021 1336 ?Last data filed at 08/29/2021 1200 ?Gross per  24 hour  ?Intake --  ?Output 725 ml  ?Net -725 ml  ? ?Filed Weights  ? 08/29/21 0625  ?Weight: 107.2 kg  ? ? ?Exam: ? ?General: 76 y.o. year-old male well developed well nourished in no acute distress.  Alert and oriented x3. ?Cardiovascular: Regular rate and rhythm with no rubs or gallops.  No thyromegaly or JVD noted.   ?Respiratory: Mild rales at bases.  Poor inspiratory effort.   ?Abdomen: Soft  nontender nondistended with normal bowel sounds x4 quadrants. ?Musculoskeletal: Trace lower extremity edema.  ?Skin: No ulcerative lesions noted or rashes, ?Psychiatry: Mood is appropriate for condition and setting ? ? ?Data Reviewed: ?CBC: ?Recent Labs  ?Lab 08/28/21 ?2542 08/28/21 ?2358 08/29/21 ?0102 08/29/21 ?7062  ?WBC 7.6  --   --  8.5  ?NEUTROABS 6.4  --   --  7.4  ?HGB 14.6 14.6  15.0 13.3 13.9  ?HCT 44.5 43.0  44.0 39.0 42.4  ?MCV 95.3  --   --  95.3  ?PLT 114*  --   --  97*  ? ?Basic Metabolic Panel: ?Recent Labs  ?Lab 08/28/21 ?3762 08/28/21 ?2358 08/29/21 ?0102 08/29/21 ?8315  ?NA 136 137  137 138 135  ?K 4.2 4.0  4.0 3.8 4.0  ?CL 105 104  --  104  ?CO2 23  --   --  22  ?GLUCOSE 154* 157*  --  154*  ?BUN 29* 31*  --  29*  ?CREATININE 2.52* 2.50*  --  2.43*  ?CALCIUM 8.8*  --   --  8.6*  ? ?GFR: ?Estimated Creatinine Clearance: 29.7 mL/min (A) (by C-G formula based on SCr of 2.43 mg/dL (H)). ?Liver Function Tests: ?Recent Labs  ?Lab 08/28/21 ?1761 08/29/21 ?6073  ?AST 21 21  ?ALT 20 18  ?ALKPHOS 62 55  ?BILITOT 0.7 0.7  ?PROT 7.5 6.9  ?ALBUMIN 3.6 3.2*  ? ?No results for input(s): LIPASE, AMYLASE in the last 168 hours. ?No results for input(s): AMMONIA in the last 168 hours. ?Coagulation Profile: ?Recent Labs  ?Lab 08/28/21 ?2347  ?INR 1.5*  ? ?Cardiac Enzymes: ?No results for input(s): CKTOTAL, CKMB, CKMBINDEX, TROPONINI in the last 168 hours. ?BNP (last 3 results) ?No results for input(s): PROBNP in the last 8760 hours. ?HbA1C: ?No results for input(s): HGBA1C in the last 72 hours. ?CBG: ?No results for input(s): GLUCAP in the last 168 hours. ?Lipid Profile: ?No results for input(s): CHOL, HDL, LDLCALC, TRIG, CHOLHDL, LDLDIRECT in the last 72 hours. ?Thyroid Function Tests: ?No results for input(s): TSH, T4TOTAL, FREET4, T3FREE, THYROIDAB in the last 72 hours. ?Anemia Panel: ?No results for input(s): VITAMINB12, FOLATE, FERRITIN, TIBC, IRON, RETICCTPCT in the last 72 hours. ?Urine analysis: ?    ?Component Value Date/Time  ? [address] YELLOW 08/28/2021 2337  ? APPEARANCEUR HAZY (A) 08/28/2021 2337  ? APPEARANCEUR Clear 04/01/2015 1427  ? LABSPEC 1.009 08/28/2021 2337  ? PHURINE 6.0 08/28/2021 2337  ? GLUCOSEU >=500 (A) 08/28/2021 2337  ? GLUCOSEU NEG mg/dL 01/10/2008 2127  ? HGBUR LARGE (A) 08/28/2021 2337  ? [place] NEGATIVE 08/28/2021 2337  ? BILIRUBINUR negative 08/23/2021 1421  ? BILIRUBINUR Negative 04/01/2015 1427  ? [place] NEGATIVE 08/28/2021 2337  ? PROTEINUR 30 (A) 08/28/2021 2337  ? UROBILINOGEN 0.2 08/23/2021 1421  ? UROBILINOGEN 1 10/05/2012 1500  ? NITRITE NEGATIVE 08/28/2021 2337  ? LEUKOCYTESUR MODERATE (A) 08/28/2021 2337  ? ?Sepsis Labs: ?@LABRCNTIP (procalcitonin:4,lacticidven:4) ? ?) ?Recent Results (from the past 240 hour(s))  ?Urine Culture     Status: Abnormal  ?  Collection Time: 08/23/21  3:06 PM  ? Specimen: Urine, Clean Catch  ?Result Value Ref Range Status  ? Specimen Description URINE, CLEAN CATCH  Final  ? Special Requests   Final  ?  NONE ?Performed at [facility], [place] [address] [place], [place] [address] ?  ? Culture 40,000 COLONIES/mL PROTEUS MIRABILIS (A)  Final  ? Report Status 08/27/2021 FINAL  Final  ? Organism ID, Bacteria PROTEUS MIRABILIS (A)  Final  ?    Susceptibility  ? Proteus mirabilis - MIC*  ?  AMPICILLIN <=2 SENSITIVE Sensitive   ?  CEFAZOLIN <=4 SENSITIVE Sensitive   ?  CEFEPIME <=0.12 SENSITIVE Sensitive   ?  CEFTRIAXONE <=0.25 SENSITIVE Sensitive   ?  CIPROFLOXACIN <=0.25 SENSITIVE Sensitive   ?  GENTAMICIN <=1 SENSITIVE Sensitive   ?  IMIPENEM 2 SENSITIVE Sensitive   ?  NITROFURANTOIN RESISTANT Resistant   ?  TRIMETH/SULFA <=20 SENSITIVE Sensitive   ?  AMPICILLIN/SULBACTAM <=2 SENSITIVE Sensitive   ?  PIP/TAZO <=4 SENSITIVE Sensitive   ?  * 40,000 COLONIES/mL PROTEUS MIRABILIS  ?Resp Panel by RT-PCR (Flu A&B, Covid) Nasopharyngeal Swab     Status: None  ? Collection Time: 08/29/21 12:13 AM  ? Specimen: Nasopharyngeal Swab; Nasopharyngeal(NP)  swabs in vial transport medium  ?Result Value Ref Range Status  ? SARS Coronavirus 2 by RT PCR NEGATIVE NEGATIVE Final  ?  Comment: (NOTE) ?SARS-CoV-2 target nucleic acids are NOT DETECTED. ? ?The SARS-CoV-2 RNA is

## 2021-08-29 NOTE — Progress Notes (Addendum)
?  Transition of Care (TOC) Screening Note ? ? ?Patient Details  ?Name: Ricky Hayden ?Date of Birth: 1945/12/25 ? ? ?Transition of Care (TOC) CM/SW Contact:    ?Dahlia Client, Romeo Rabon, RN ?Phone Number: ?08/29/2021, 2:20 PM ? ? ? ?Transition of Care Department [facility]) has reviewed patient and no TOC needs have been identified at this time. Pt with baseline home 02-2L at bedtime. We will continue to monitor patient advancement through interdisciplinary progression rounds. If new patient transition needs arise, please place a TOC consult. ?  ?

## 2021-08-29 NOTE — ED Provider Notes (Signed)
?[facility] ?Provider Note ? ? ?CSN: [id number] ?Arrival date & time: 08/28/21  2334 ? ?  ? ?History ? ?Chief Complaint  ?Patient presents with  ? Respiratory Distress  ? ? ?Ricky Hayden is a 76 y.o. male. ? ?Presents to the emergency department for evaluation of shortness of breath.  Patient has a known history of COPD and CHF.  Patient's son reports that he started to seem like he was having difficulty breathing tonight and then became altered.  Son noted diaphoresis and chills.  EMS report that the patient's sats were in the low 80% range upon their arrival.  He had an increased work of breathing and was placed on CPAP for transport.  Saturations improved.  Patient altered, confused at arrival. ? ? ?  ? ?Home Medications ?Prior to Admission medications   ?Medication Sig Start Date End Date Taking? Authorizing Provider  ?acetaminophen (TYLENOL) 500 MG tablet Take 1,000 mg by mouth every 6 (six) hours as needed for moderate pain or headache.   Yes [provider]  ?albuterol (PROVENTIL HFA;VENTOLIN HFA) 108 (90 BASE) MCG/ACT inhaler Inhale 2 puffs into the lungs every 6 (six) hours as needed for wheezing. 12/10/14  Yes Maryellen Pile, MD  ?amiodarone (PACERONE) 200 MG tablet Take 1 tablet (200 mg total) by mouth daily. 05/16/21  Yes Deboraha Sprang, MD  ?apixaban (ELIQUIS) 5 MG TABS tablet Take 1 tablet by mouth in the morning and at bedtime. 03/26/21  Yes [provider]  ?budesonide-formoterol (SYMBICORT) 80-4.5 MCG/ACT inhaler Inhale 2 puffs into the lungs 2 (two) times daily. 09/17/15  Yes Corky Sox, MD  ?carvedilol (COREG) 6.25 MG tablet Take 1 tablet (6.25 mg total) by mouth 2 (two) times daily with a meal. 01/22/20  Yes Bensimhon, Shaune Pascal, MD  ?cetirizine (ZYRTEC) 10 MG tablet Take 1 tablet by mouth daily. 01/23/21  Yes [provider]  ?diclofenac Sodium (VOLTAREN) 1 % GEL Apply 2 g topically daily as needed (pain).    Yes [provider]  ?docusate sodium (COLACE) 100 MG capsule Take 1-2 capsules (100-200 mg total) by mouth 2 (two) times daily as needed for mild constipation. 03/05/14  Yes Kelby Aline, MD  ?empagliflozin (JARDIANCE) 10 MG TABS tablet Take 1 tablet (10 mg total) by mouth daily before breakfast. 07/03/21  Yes Bensimhon, Shaune Pascal, MD  ?finasteride (PROSCAR) 5 MG tablet TAKE ONE TABLET BY MOUTH ONCE DAILY ?Patient taking differently: Take 5 mg by mouth daily. 03/05/15  Yes Maryellen Pile, MD  ?Evelina Bucy BILOBA PO Take 2 tablets by mouth at bedtime.   Yes [provider]  ?hydrALAZINE (APRESOLINE) 50 MG tablet Take 1 tablet (50 mg total) by mouth every 8 (eight) hours. 05/16/21  Yes Deboraha Sprang, MD  ?Multiple Vitamins-Minerals (ICAPS AREDS 2 PO) Take 2 tablets by mouth daily.   Yes [provider]  ?omeprazole (PRILOSEC) 20 MG capsule Take 1 capsule (20 mg total) by mouth daily. 12/10/14  Yes Maryellen Pile, MD  ?PHENobarbital (LUMINAL) 64.8 MG tablet Take 1 tablet (64.8 mg total) by mouth 2 (two) times daily. 07/24/15  Yes Corky Sox, MD  ?polyethylene glycol [facility] / GLYCOLAX) packet Take 17 g by mouth daily as needed for moderate constipation. 12/10/14  Yes Maryellen Pile, MD  ?pravastatin (PRAVACHOL) 40 MG tablet Take 1 tablet (40 mg total) by mouth every evening. 12/10/14  Yes Maryellen Pile, MD  ?sacubitril-valsartan (ENTRESTO) 24-26 MG Take 1 tablet by mouth 2 (two)  times daily. 01/22/20  Yes Bensimhon, Shaune Pascal, MD  ?spironolactone (ALDACTONE) 25 MG tablet Take 0.5 tablets (12.5 mg total) by mouth daily. 08/19/19  Yes Ghimire, Henreitta Leber, MD  ?sulfamethoxazole-trimethoprim (BACTRIM) 400-80 MG tablet Take 1 tablet by mouth 2 (two) times daily for 20 days. ?Patient taking differently: Take 1 tablet by mouth See admin instructions. Bid x 20 days 08/23/21 09/12/21 Yes Crain, Whitney L, PA  ?tamsulosin (FLOMAX) 0.4 MG CAPS capsule Take 1 capsule (0.4 mg total) by mouth daily. 03/05/15  Yes Maryellen Pile,  MD  ?torsemide (DEMADEX) 20 MG tablet Take two tablets in the am and one tablet in the pm. ?Patient taking differently: Take 20-40 mg by mouth See admin instructions. 40 mg in the morning ?20 mg in the evening 07/03/21  Yes Bensimhon, Shaune Pascal, MD  ?SPIRIVA HANDIHALER 18 MCG inhalation capsule INHALE ONE DOSE BY MOUTH ONCE DAILY ?Patient not taking: Reported on 08/29/2021 09/27/15   Maryellen Pile, MD  ?   ? ?Allergies    ?Calcium channel blockers   ? ?Review of Systems   ?Review of Systems ? ?Physical Exam ?Updated Vital Signs ?BP 125/67   Pulse 79   Temp 100.3 ?F (37.9 ?C) (Axillary)   Resp 20   SpO2 95%  ?Physical Exam ?Vitals and nursing note reviewed.  ?Constitutional:   ?   General: He is in acute distress.  ?   Appearance: He is well-developed. He is ill-appearing.  ?HENT:  ?   Head: Normocephalic and atraumatic.  ?   Mouth/Throat:  ?   Mouth: Mucous membranes are moist.  ?Eyes:  ?   General: Vision grossly intact. Gaze aligned appropriately.  ?   Extraocular Movements: Extraocular movements intact.  ?   Conjunctiva/sclera: Conjunctivae normal.  ?Cardiovascular:  ?   Rate and Rhythm: Regular rhythm. Tachycardia present.  ?   Pulses: Normal pulses.  ?   Heart sounds: Normal heart sounds, S1 normal and S2 normal. No murmur heard. ?  No friction rub. No gallop.  ?Pulmonary:  ?   Effort: Tachypnea and accessory muscle usage present. No respiratory distress.  ?   Breath sounds: Normal breath sounds.  ?Abdominal:  ?   Palpations: Abdomen is soft.  ?   Tenderness: There is no abdominal tenderness. There is no guarding or rebound.  ?   Hernia: No hernia is present.  ?Musculoskeletal:     ?   General: No swelling.  ?   Cervical back: Full passive range of motion without pain, normal range of motion and neck supple. No pain with movement, spinous process tenderness or muscular tenderness. Normal range of motion.  ?   Right lower leg: No edema.  ?   Left lower leg: No edema.  ?Skin: ?   General: Skin is warm and dry.   ?   Capillary Refill: Capillary refill takes less than 2 seconds.  ?   Findings: No ecchymosis, erythema, lesion or wound.  ?Neurological:  ?   Mental Status: He is lethargic and confused.  ?   GCS: GCS eye subscore is 4. GCS verbal subscore is 4. GCS motor subscore is 6.  ?   Cranial Nerves: Cranial nerves 2-12 are intact.  ?   Sensory: Sensation is intact.  ?   Motor: Motor function is intact. No weakness or abnormal muscle tone.  ?   Coordination: Coordination is intact.  ? ? ?ED Results / Procedures / Treatments   ?Labs ?(all labs ordered are listed, but only  abnormal results are displayed) ?Labs Reviewed  ?COMPREHENSIVE METABOLIC PANEL - Abnormal; Notable for the following components:  ?    Result Value  ? Glucose, Bld 154 (*)   ? BUN 29 (*)   ? Creatinine, Ser 2.52 (*)   ? Calcium 8.8 (*)   ? GFR, Estimated 26 (*)   ? All other components within normal limits  ?CBC WITH DIFFERENTIAL/PLATELET - Abnormal; Notable for the following components:  ? Platelets 114 (*)   ? Lymphs Abs 0.6 (*)   ? Abs Immature Granulocytes 0.12 (*)   ? All other components within normal limits  ?PROTIME-INR - Abnormal; Notable for the following components:  ? Prothrombin Time 18.1 (*)   ? INR 1.5 (*)   ? All other components within normal limits  ?URINALYSIS, ROUTINE W REFLEX MICROSCOPIC - Abnormal; Notable for the following components:  ? APPearance HAZY (*)   ? Glucose, UA >=500 (*)   ? Hgb urine dipstick LARGE (*)   ? Protein, ur 30 (*)   ? Leukocytes,Ua MODERATE (*)   ? RBC / HPF >50 (*)   ? WBC, UA >50 (*)   ? Bacteria, UA RARE (*)   ? All other components within normal limits  ?I-STAT CHEM 8, ED - Abnormal; Notable for the following components:  ? BUN 31 (*)   ? Creatinine, Ser 2.50 (*)   ? Glucose, Bld 157 (*)   ? Calcium, Ion 1.06 (*)   ? All other components within normal limits  ?I-STAT ARTERIAL BLOOD GAS, ED - Abnormal; Notable for the following components:  ? pCO2 arterial 49.8 (*)   ? pO2, Arterial 120 (*)   ? Bicarbonate  28.1 (*)   ? Acid-Base Excess 3.0 (*)   ? All other components within normal limits  ?I-STAT VENOUS BLOOD GAS, ED - Abnormal; Notable for the following components:  ? pH, Ven 7.515 (*)   ? pCO2, Ven 31.8 (*)   ? p

## 2021-08-29 NOTE — ED Notes (Signed)
Trialed off Bipap. Placed on 3LPM via [place] - uses same at home while sleeping. Remains >93%. 20 RR. ?

## 2021-08-29 NOTE — H&P (Signed)
?History and Physical  ? ? ?Ricky Hayden TWS:[id number] DOB: Mar 01, 1946 DOA: 08/28/2021 ? ?PCP: [facility]  ?Patient coming from: Home. ? ?Chief Complaint: Fever and chills. ? ?HPI: Ricky Hayden is a 76 y.o. 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  Had gone to the urgent care about a week ago and was prescribed Bactrim.  Urine cultures show Proteus mirabilis.  Last evening at home patient started having fever and chills and shortness of breath.  Was brought to the ER. ? ?ED Course: Initially patient required BiPAP for shortness of breath but was eventually weaned off.  Patient had a fever 103 ?F with UA showing features concerning for UTI.  Had blood cultures urine cultures drawn and started on empiric antibiotics admitted for sepsis from UTI.  Chest x-ray was unremarkable. ? ?Review of Systems: As per HPI, rest all negative. ? ? ?Past Medical History:  ?Diagnosis Date  ? Arthritis   ? osteoarthritis of left knee  ? Ascending aorta dilatation (HCC)   ? 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 Cardiac arrhythmia   ? life threatening, secondary to CCB vs b- blockers  ? Cardiomyopathy ([place])   ? Chronic joint pain   ? Chronic systolic CHF (congestive heart failure) ([place])   ? CKD (chronic kidney disease), stage III ([place])   ? COPD (chronic obstructive pulmonary disease) ([place])   ? Coronary artery disease   ? Dilated aortic root ([place])   ? Enlarged prostate   ? 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 GERD (gastroesophageal reflux disease)   ? Gout   ? Hiatal hernia   ? Hyperlipidemia   ? Hypertension   ? Intracranial hematoma ([place]) 1995  ? history of, s/p evacuation by Dr. Sherwood Gambler  ? Nocturia   ? Obesity   ? Pansinusitis   ? a.  complicated by brain abscess and bleeding requiring craniotomy in 1995.  ? PONV (postoperative nausea and vomiting)    ? Sinusitis   ? s/p ethmoidectomy and nasal septoplasty  ? Vertigo   ? intermitantly  ? ? ?Past Surgical History:  ?Procedure Laterality Date  ? BIV UPGRADE N/A 01/29/2020  ? Procedure: BIV UPGRADE;  Surgeon: Evans Lance, MD;  Location: [facility];  Service: Cardiovascular;  Laterality: N/A;  ? CIRCUMCISION N/A 11/20/2013  ? Procedure: CIRCUMCISION ADULT;  Surgeon: Claybon Jabs, MD;  Location: [facility];  Service: Urology;  Laterality: N/A;  ? CRANIOTOMY  1995  ? hematomy due to sinus infection   ? PACEMAKER IMPLANT N/A 01/17/2020  ? Procedure: PACEMAKER IMPLANT;  Surgeon: Deboraha Sprang, MD;  Location: [facility];  Service: Cardiovascular;  Laterality: N/A;  ? RIGHT/LEFT HEART CATH AND CORONARY ANGIOGRAPHY N/A 09/20/2019  ? Procedure: RIGHT/LEFT HEART CATH AND CORONARY ANGIOGRAPHY;  Surgeon: Jolaine Artist, MD;  Location: [facility];  Service: Cardiovascular;  Laterality: N/A;  ? shoulder surg rt   1995  ? SINUS SURGERY WITH INSTATRAK    ? ethmoidectomy and nasal septum repair  ? ? ? reports that he quit smoking about 35 years ago. He has never used smokeless tobacco. He reports that he does not drink alcohol and does not use drugs. ? ?Allergies  ?Allergen Reactions  ? Calcium Channel Blockers Other (See Comments)  ?  Came to hospital in 1995-caused  chest pain ?  ? ? ?Family History  ?Problem Relation Age of Onset  ? Heart failure Mother 74  ? Heart attack Father 93  ? ? ?Prior to Admission medications   ?Medication Sig Start Date End Date Taking? Authorizing Provider  ?acetaminophen (TYLENOL) 500 MG tablet Take 1,000 mg by mouth every 6 (six) hours as needed for moderate pain or headache.   Yes [provider]  ?albuterol (PROVENTIL HFA;VENTOLIN HFA) 108 (90 BASE) MCG/ACT inhaler Inhale 2 puffs into the lungs every 6 (six) hours as needed for wheezing. 12/10/14  Yes Maryellen Pile, MD  ?amiodarone (PACERONE) 200 MG tablet Take 1 tablet (200 mg total) by mouth daily. 05/16/21   Yes Deboraha Sprang, MD  ?apixaban (ELIQUIS) 5 MG TABS tablet Take 1 tablet by mouth in the morning and at bedtime. 03/26/21  Yes [provider]  ?budesonide-formoterol (SYMBICORT) 80-4.5 MCG/ACT inhaler Inhale 2 puffs into the lungs 2 (two) times daily. 09/17/15  Yes Corky Sox, MD  ?carvedilol (COREG) 6.25 MG tablet Take 1 tablet (6.25 mg total) by mouth 2 (two) times daily with a meal. 01/22/20  Yes Bensimhon, Shaune Pascal, MD  ?cetirizine (ZYRTEC) 10 MG tablet Take 1 tablet by mouth daily. 01/23/21  Yes [provider]  ?diclofenac Sodium (VOLTAREN) 1 % GEL Apply 2 g topically daily as needed (pain).    Yes [provider]  ?docusate sodium (COLACE) 100 MG capsule Take 1-2 capsules (100-200 mg total) by mouth 2 (two) times daily as needed for mild constipation. 03/05/14  Yes Kelby Aline, MD  ?empagliflozin (JARDIANCE) 10 MG TABS tablet Take 1 tablet (10 mg total) by mouth daily before breakfast. 07/03/21  Yes Bensimhon, Shaune Pascal, MD  ?finasteride (PROSCAR) 5 MG tablet TAKE ONE TABLET BY MOUTH ONCE DAILY ?Patient taking differently: Take 5 mg by mouth daily. 03/05/15  Yes Maryellen Pile, MD  ?Evelina Bucy BILOBA PO Take 2 tablets by mouth at bedtime.   Yes [provider]  ?hydrALAZINE (APRESOLINE) 50 MG tablet Take 1 tablet (50 mg total) by mouth every 8 (eight) hours. 05/16/21  Yes Deboraha Sprang, MD  ?Multiple Vitamins-Minerals (ICAPS AREDS 2 PO) Take 2 tablets by mouth daily.   Yes [provider]  ?omeprazole (PRILOSEC) 20 MG capsule Take 1 capsule (20 mg total) by mouth daily. 12/10/14  Yes Maryellen Pile, MD  ?PHENobarbital (LUMINAL) 64.8 MG tablet Take 1 tablet (64.8 mg total) by mouth 2 (two) times daily. 07/24/15  Yes Corky Sox, MD  ?polyethylene glycol [facility] / GLYCOLAX) packet Take 17 g by mouth daily as needed for moderate constipation. 12/10/14  Yes Maryellen Pile, MD  ?pravastatin (PRAVACHOL) 40 MG tablet Take 1 tablet (40 mg total) by mouth every evening.  12/10/14  Yes Maryellen Pile, MD  ?sacubitril-valsartan (ENTRESTO) 24-26 MG Take 1 tablet by mouth 2 (two) times daily. 01/22/20  Yes Bensimhon, Shaune Pascal, MD  ?spironolactone (ALDACTONE) 25 MG tablet Take 0.5 tablets (12.5 mg total) by mouth daily. 08/19/19  Yes Ghimire, Henreitta Leber, MD  ?sulfamethoxazole-trimethoprim (BACTRIM) 400-80 MG tablet Take 1 tablet by mouth 2 (two) times daily for 20 days. ?Patient taking differently: Take 1 tablet by mouth See admin instructions. Bid x 20 days 08/23/21 09/12/21 Yes Crain, Whitney L, PA  ?tamsulosin (FLOMAX) 0.4 MG CAPS capsule Take 1 capsule (0.4 mg total) by mouth daily. 03/05/15  Yes Maryellen Pile, MD  ?torsemide (DEMADEX) 20 MG tablet Take two tablets in the am and one tablet in the  pm. ?Patient taking differently: Take 20-40 mg by mouth See admin instructions. 40 mg in the morning ?20 mg in the evening 07/03/21  Yes Bensimhon, Shaune Pascal, MD  ?SPIRIVA HANDIHALER 18 MCG inhalation capsule INHALE ONE DOSE BY MOUTH ONCE DAILY ?Patient not taking: Reported on 08/29/2021 09/27/15   Maryellen Pile, MD  ? ? ?Physical Exam: ?Constitutional: Moderately built and nourished. ?Vitals:  ? 08/29/21 0345 08/29/21 0415 08/29/21 0518 08/29/21 0545  ?BP: 112/64 116/75  115/64  ?Pulse: 71 73  67  ?Resp: 18 19  18   ?Temp:      ?TempSrc:      ?SpO2: 94% 94% 94% 95%  ? ?Eyes: Anicteric no pallor. ?ENMT: No discharge from the ears eyes nose and mouth. ?Neck: No mass felt.  No neck rigidity. ?Respiratory: No rhonchi or crepitations. ?Cardiovascular: S1-S2 heard. ?Abdomen: Soft nontender bowel sound present. ?Musculoskeletal: No edema. ?Skin: Mild erythema of the scrotal area. ?Neurologic: Alert awake oriented to name and place moving all extremities. ?Psychiatric: Oriented to his name and place. ? ? ?Labs on Admission: I have personally reviewed following labs and imaging studies ? ?CBC: ?Recent Labs  ?Lab 08/28/21 ?2347 08/28/21 ?2358 08/29/21 ?0102  ?WBC 7.6  --   --   ?NEUTROABS 6.4  --   --   ?HGB  14.6 14.6  15.0 13.3  ?HCT 44.5 43.0  44.0 39.0  ?MCV 95.3  --   --   ?PLT 114*  --   --   ? ?Basic Metabolic Panel: ?Recent Labs  ?Lab 08/28/21 ?2347 08/28/21 ?2358 08/29/21 ?0102  ?NA 136 137  137 138

## 2021-08-29 NOTE — Consult Note (Signed)
Urology Consult  ? ?History of Present Illness: Ricky Hayden is a 76 y.o. with UTI, hematuria, and non obstructing left renal stones on CT ? ?He presented to the ED early this morning with fevers and required some respiratory support. Cultures from the ED are pending, but a culture was done 4/22 which was positive for proteus.  ? ?Ricky Hayden reports he has been having increasing difficulty voiding recently. He also endorses some left back pain ? ?I reviewed his CT scan from earlier today. His bladder is distended, and there are non obstructing renal stones on the left. There additionally is a subcentimeter exophytic lesion on the left kidney ? ?Of note, Ricky. Hayden had a urine culture positive for Proteus in December. ? ?Past Medical History:  ?Diagnosis Date  ? Arthritis   ? osteoarthritis of left knee  ? Ascending aorta dilatation (HCC)   ? 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 Cardiac arrhythmia   ? life threatening, secondary to CCB vs b- blockers  ? Cardiomyopathy ([place])   ? Chronic joint pain   ? Chronic systolic CHF (congestive heart failure) ([place])   ? CKD (chronic kidney disease), stage III ([place])   ? COPD (chronic obstructive pulmonary disease) ([place])   ? Coronary artery disease   ? Dilated aortic root ([place])   ? Enlarged prostate   ? 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 GERD (gastroesophageal reflux disease)   ? Gout   ? Hiatal hernia   ? Hyperlipidemia   ? Hypertension   ? Intracranial hematoma ([place]) 1995  ? history of, s/p evacuation by Dr. Sherwood Gambler  ? Nocturia   ? Obesity   ? Pansinusitis   ? a.  complicated by brain abscess and bleeding requiring craniotomy in 1995.  ? PONV (postoperative nausea and vomiting)   ? Sinusitis   ? s/p ethmoidectomy and nasal septoplasty  ? Vertigo   ? intermitantly  ? ? ?Past Surgical History:  ?Procedure Laterality Date  ? BIV UPGRADE N/A 01/29/2020  ? Procedure: BIV UPGRADE;  Surgeon: Evans Lance, MD;  Location: [facility];  Service: Cardiovascular;   Laterality: N/A;  ? CIRCUMCISION N/A 11/20/2013  ? Procedure: CIRCUMCISION ADULT;  Surgeon: Claybon Jabs, MD;  Location: [facility];  Service: Urology;  Laterality: N/A;  ? CRANIOTOMY  1995  ? hematomy due to sinus infection   ? PACEMAKER IMPLANT N/A 01/17/2020  ? Procedure: PACEMAKER IMPLANT;  Surgeon: Deboraha Sprang, MD;  Location: [facility];  Service: Cardiovascular;  Laterality: N/A;  ? RIGHT/LEFT HEART CATH AND CORONARY ANGIOGRAPHY N/A 09/20/2019  ? Procedure: RIGHT/LEFT HEART CATH AND CORONARY ANGIOGRAPHY;  Surgeon: Jolaine Artist, MD;  Location: [facility];  Service: Cardiovascular;  Laterality: N/A;  ? shoulder surg rt   1995  ? SINUS SURGERY WITH INSTATRAK    ? ethmoidectomy and nasal septum repair  ? ? ?Current Hospital Medications: ? ?Home Meds:  ?No current facility-administered medications on file prior to encounter.  ? ?Current Outpatient Medications on File Prior to Encounter  ?Medication Sig Dispense Refill  ? acetaminophen (TYLENOL) 500 MG tablet Take 1,000 mg by mouth every 6 (six) hours as needed for moderate pain or headache.    ? albuterol (PROVENTIL HFA;VENTOLIN HFA) 108 (90 BASE) MCG/ACT inhaler Inhale 2 puffs into the lungs every 6 (six) hours as needed for wheezing. 1 Inhaler 11  ? amiodarone (PACERONE) 200 MG tablet Take 1 tablet (200 mg total)  by mouth daily. 90 tablet 3  ? apixaban (ELIQUIS) 5 MG TABS tablet Take 1 tablet by mouth in the morning and at bedtime.    ? budesonide-formoterol (SYMBICORT) 80-4.5 MCG/ACT inhaler Inhale 2 puffs into the lungs 2 (two) times daily. 1 Inhaler 12  ? carvedilol (COREG) 6.25 MG tablet Take 1 tablet (6.25 mg total) by mouth 2 (two) times daily with a meal. 180 tablet 3  ? cetirizine (ZYRTEC) 10 MG tablet Take 1 tablet by mouth daily.    ? diclofenac Sodium (VOLTAREN) 1 % GEL Apply 2 g topically daily as needed (pain).     ? docusate sodium (COLACE) 100 MG capsule Take 1-2 capsules (100-200 mg total) by mouth 2 (two) times daily as  needed for mild constipation. 180 capsule 3  ? empagliflozin (JARDIANCE) 10 MG TABS tablet Take 1 tablet (10 mg total) by mouth daily before breakfast. 90 tablet 3  ? finasteride (PROSCAR) 5 MG tablet TAKE ONE TABLET BY MOUTH ONCE DAILY (Patient taking differently: Take 5 mg by mouth daily.) 30 tablet 11  ? GINKGO BILOBA PO Take 2 tablets by mouth at bedtime.    ? hydrALAZINE (APRESOLINE) 50 MG tablet Take 1 tablet (50 mg total) by mouth every 8 (eight) hours. 270 tablet 3  ? Multiple Vitamins-Minerals (ICAPS AREDS 2 PO) Take 2 tablets by mouth daily.    ? omeprazole (PRILOSEC) 20 MG capsule Take 1 capsule (20 mg total) by mouth daily. 30 capsule 11  ? PHENobarbital (LUMINAL) 64.8 MG tablet Take 1 tablet (64.8 mg total) by mouth 2 (two) times daily. 180 tablet 3  ? polyethylene glycol (MIRALAX / GLYCOLAX) packet Take 17 g by mouth daily as needed for moderate constipation. 14 each 0  ? pravastatin (PRAVACHOL) 40 MG tablet Take 1 tablet (40 mg total) by mouth every evening. 90 tablet 3  ? sacubitril-valsartan (ENTRESTO) 24-26 MG Take 1 tablet by mouth 2 (two) times daily. 180 tablet 3  ? spironolactone (ALDACTONE) 25 MG tablet Take 0.5 tablets (12.5 mg total) by mouth daily. 30 tablet 0  ? sulfamethoxazole-trimethoprim (BACTRIM) 400-80 MG tablet Take 1 tablet by mouth 2 (two) times daily for 20 days. (Patient taking differently: Take 1 tablet by mouth See admin instructions. Bid x 20 days) 40 tablet 0  ? tamsulosin (FLOMAX) 0.4 MG CAPS capsule Take 1 capsule (0.4 mg total) by mouth daily. 90 capsule 3  ? torsemide (DEMADEX) 20 MG tablet Take two tablets in the am and one tablet in the pm. (Patient taking differently: Take 20-40 mg by mouth See admin instructions. 40 mg in the morning ?20 mg in the evening) 270 tablet 3  ? SPIRIVA HANDIHALER 18 MCG inhalation capsule INHALE ONE DOSE BY MOUTH ONCE DAILY (Patient not taking: Reported on 08/29/2021) 30 capsule 11  ? ? ? ?Scheduled Meds: ? amiodarone  200 mg Oral Daily  ?  carvedilol  6.25 mg Oral BID WC  ? finasteride  5 mg Oral Daily  ? hydrALAZINE  50 mg Oral Q8H  ? mometasone-formoterol  2 puff Inhalation BID  ? pantoprazole  40 mg Oral Daily  ? PHENobarbital  64.8 mg Oral BID  ? pravastatin  40 mg Oral QPM  ? ?Continuous Infusions: ? cefTRIAXone (ROCEPHIN)  IV Stopped (08/29/21 0104)  ? heparin 1,500 Units/hr (08/29/21 0834)  ? ?PRN Meds:.acetaminophen **OR** acetaminophen, albuterol, perflutren lipid microspheres (DEFINITY) IV suspension ? ?Allergies:  ?Allergies  ?Allergen Reactions  ? Calcium Channel Blockers Other (See Comments)  ?  Came to hospital in 1995-caused chest pain ?  ? ? ?Family History  ?Problem Relation Age of Onset  ? Heart failure Mother 91  ? Heart attack Father 57  ? ? ?Social History:  reports that he quit smoking about 35 years ago. He has never used smokeless tobacco. He reports that he does not drink alcohol and does not use drugs. ? ?ROS: ?A complete review of systems was performed.  All systems are negative except for pertinent findings as noted. ? ?Physical Exam:  ?Vital signs in last 24 hours: ?Temp:  [97.5 ?F (36.4 ?C)-103.3 ?F (39.6 ?C)] 103.1 ?F (39.5 ?C) (04/28 1552) ?Pulse Rate:  [67-110] 110 (04/28 1552) ?Resp:  [16-32] 32 (04/28 1552) ?BP: (104-152)/(61-89) 152/89 (04/28 1552) ?SpO2:  [94 %-100 %] 95 % (04/28 1552) ?Weight:  [107.2 kg] 107.2 kg (04/28 0625) ?Constitutional:  Alert and oriented, No acute distress ?Cardiovascular: Regular rate and rhythm ?Respiratory: Normal respiratory effort, Lungs clear bilaterally ?GI: Abdomen is soft, nontender, nondistended, no abdominal masses ?GU: No CVA tenderness ?Neurologic: Grossly intact, no focal deficits ?Psychiatric: Normal mood and affect ? ?Laboratory Data:  ?Recent Labs  ?  08/28/21 ?5956 08/28/21 ?2358 08/29/21 ?0102 08/29/21 ?3875  ?WBC 7.6  --   --  8.5  ?HGB 14.6 14.6  15.0 13.3 13.9  ?HCT 44.5 43.0  44.0 39.0 42.4  ?PLT 114*  --   --  97*  ? ? ?Recent Labs  ?  08/28/21 ?6433  08/28/21 ?2358 08/29/21 ?0102 08/29/21 ?2951  ?NA 136 137  137 138 135  ?K 4.2 4.0  4.0 3.8 4.0  ?CL 105 104  --  104  ?GLUCOSE 154* 157*  --  154*  ?BUN 29* 31*  --  29*  ?CALCIUM 8.8*  --   --  8.6*  ?CREATININE 2.52*

## 2021-08-29 NOTE — Progress Notes (Signed)
ANTICOAGULATION CONSULT NOTE - follow up ? ?Pharmacy Consult for heparin ?Indication: atrial fibrillation ? ?Allergies  ?Allergen Reactions  ? Calcium Channel Blockers Other (See Comments)  ?  Came to hospital in 1995-caused chest pain ?  ? ? ?Patient Measurements: ?Height: 5\' 7"  (170.2 cm) ?Weight: 107.2 kg (236 lb 6.4 oz) ?IBW/kg (Calculated) : 66.1 ?Heparin Dosing Weight: 90kg ? ?Vital Signs: ?Temp: 103.1 ?F (39.5 ?C) (04/28 1552) ?Temp Source: Oral (04/28 1552) ?BP: 152/89 (04/28 1552) ?Pulse Rate: 110 (04/28 1552) ? ?Labs: ?Recent Labs  ?  08/28/21 ?2347 08/28/21 ?2358 08/29/21 ?0102 08/29/21 ?1610 08/29/21 ?1539  ?HGB 14.6 14.6  15.0 13.3 13.9  --   ?HCT 44.5 43.0  44.0 39.0 42.4  --   ?PLT 114*  --   --  97*  --   ?APTT 34  --   --   --  39*  ?LABPROT 18.1*  --   --   --   --   ?INR 1.5*  --   --   --   --   ?HEPARINUNFRC  --   --   --   --  >1.10*  ?CREATININE 2.52* 2.50*  --  2.43*  --   ?TROPONINIHS 41*  --   --  51*  --   ? ? ? ?Estimated Creatinine Clearance: 29.7 mL/min (A) (by C-G formula based on SCr of 2.43 mg/dL (H)). ? ? ?Medical History: ?Past Medical History:  ?Diagnosis Date  ? Arthritis   ? osteoarthritis of left knee  ? Ascending aorta dilatation (HCC)   ? 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 Cardiac arrhythmia   ? life threatening, secondary to CCB vs b- blockers  ? Cardiomyopathy ([place])   ? Chronic joint pain   ? Chronic systolic CHF (congestive heart failure) ([place])   ? CKD (chronic kidney disease), stage III ([place])   ? COPD (chronic obstructive pulmonary disease) ([place])   ? Coronary artery disease   ? Dilated aortic root ([place])   ? Enlarged prostate   ? 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 GERD (gastroesophageal reflux disease)   ? Gout   ? Hiatal hernia   ? Hyperlipidemia   ? Hypertension   ? Intracranial hematoma ([place]) 1995  ? history of, s/p evacuation by Dr. Sherwood Gambler  ? Nocturia   ? Obesity   ? Pansinusitis   ? a.  complicated by brain abscess and bleeding requiring  craniotomy in 1995.  ? PONV (postoperative nausea and vomiting)   ? Sinusitis   ? s/p ethmoidectomy and nasal septoplasty  ? Vertigo   ? intermitantly  ? ? ?Assessment: ?76yo male c/o worsening SOB, admitted for possible sepsis d/t Proteus UTI, also endorses hematuria >> to transition from Eliquis for Afib to heparin; last dose of Eliquis taken 4/27 1900. ? ?CBC : hgb 13.9 , Hct 42.4, Pltc 114>97k  ? ? Heparin level is >1.1 and a/PTT = 39 sec ?HL high due to previously taking apixaban.  Will monitor heparin using aPTT for now.   ?No issues or interruptions with IV heparin infusion and no bleeding noted per RN.  ? ?Goal of Therapy:  ?Heparin level 0.3-0.5 units/ml ?aPTT 66-85 seconds ?Monitor platelets by anticoagulation protocol: Yes ?  ?Plan:  ?Increase Heparin infusion to 1700 units/hr. ?Check 8 hour aPTT/ HL ?Monitor heparin levels, aPTT (while Eliquis affects anti-Xa), and CBC. ? ? ?Nicole Cella, RPh ?Clinical Pharmacist ?Please check AMION for all [facility] phone numbers ?After  10:00 PM, call [place] [phone] ? ?08/29/2021,4:45 PM ? ? ?

## 2021-08-29 NOTE — Progress Notes (Signed)
Pt arrived from ed placed in bed, skin check done VSS , alert to voice. Bed alarm on, call bell within  reach and son at the bedside.  ?

## 2021-08-29 NOTE — Progress Notes (Signed)
?  Echocardiogram ?2D Echocardiogram has been performed. ? ?Ricky Hayden ?08/29/2021, 2:51 PM ?

## 2021-08-30 ENCOUNTER — Inpatient Hospital Stay (HOSPITAL_COMMUNITY): Payer: No Typology Code available for payment source

## 2021-08-30 DIAGNOSIS — I5042 Chronic combined systolic (congestive) and diastolic (congestive) heart failure: Secondary | ICD-10-CM | POA: Diagnosis not present

## 2021-08-30 DIAGNOSIS — N3001 Acute cystitis with hematuria: Secondary | ICD-10-CM

## 2021-08-30 DIAGNOSIS — A419 Sepsis, unspecified organism: Secondary | ICD-10-CM | POA: Diagnosis not present

## 2021-08-30 DIAGNOSIS — N400 Enlarged prostate without lower urinary tract symptoms: Secondary | ICD-10-CM | POA: Diagnosis not present

## 2021-08-30 DIAGNOSIS — I10 Essential (primary) hypertension: Secondary | ICD-10-CM

## 2021-08-30 DIAGNOSIS — Z950 Presence of cardiac pacemaker: Secondary | ICD-10-CM

## 2021-08-30 DIAGNOSIS — N179 Acute kidney failure, unspecified: Secondary | ICD-10-CM | POA: Diagnosis not present

## 2021-08-30 DIAGNOSIS — J449 Chronic obstructive pulmonary disease, unspecified: Secondary | ICD-10-CM

## 2021-08-30 DIAGNOSIS — I428 Other cardiomyopathies: Secondary | ICD-10-CM

## 2021-08-30 LAB — CBC
HCT: 39.9 % (ref 39.0–52.0)
HCT: 40 % (ref 39.0–52.0)
Hemoglobin: 13 g/dL (ref 13.0–17.0)
Hemoglobin: 13.2 g/dL (ref 13.0–17.0)
MCH: 31 pg (ref 26.0–34.0)
MCH: 31.6 pg (ref 26.0–34.0)
MCHC: 32.6 g/dL (ref 30.0–36.0)
MCHC: 33 g/dL (ref 30.0–36.0)
MCV: 95 fL (ref 80.0–100.0)
MCV: 95.7 fL (ref 80.0–100.0)
Platelets: 105 10*3/uL — ABNORMAL LOW (ref 150–400)
Platelets: 101 10*3/uL — ABNORMAL LOW (ref 150–400)
RBC: 4.2 MIL/uL — ABNORMAL LOW (ref 4.22–5.81)
RBC: 4.18 MIL/uL — ABNORMAL LOW (ref 4.22–5.81)
RDW: 13.3 % (ref 11.5–15.5)
RDW: 13.4 % (ref 11.5–15.5)
WBC: 12.5 10*3/uL — ABNORMAL HIGH (ref 4.0–10.5)
WBC: 10.2 10*3/uL (ref 4.0–10.5)
nRBC: 0 % (ref 0.0–0.2)
nRBC: 0 % (ref 0.0–0.2)

## 2021-08-30 LAB — HEPARIN LEVEL (UNFRACTIONATED)
Heparin Unfractionated: >1.1 IU/mL — ABNORMAL HIGH (ref 0.30–0.70)
Heparin Unfractionated: 1.06 IU/mL — ABNORMAL HIGH (ref 0.30–0.70)

## 2021-08-30 LAB — RENAL FUNCTION PANEL
Albumin: 3 g/dL — ABNORMAL LOW (ref 3.5–5.0)
Anion gap: 7 (ref 5–15)
BUN: 30 mg/dL — ABNORMAL HIGH (ref 8–23)
CO2: 27 mmol/L (ref 22–32)
Calcium: 8.7 mg/dL — ABNORMAL LOW (ref 8.9–10.3)
Chloride: 106 mmol/L (ref 98–111)
Creatinine, Ser: 2.23 mg/dL — ABNORMAL HIGH (ref 0.61–1.24)
GFR, Estimated: 30 mL/min — ABNORMAL LOW (ref >60)
Glucose, Bld: 115 mg/dL — ABNORMAL HIGH (ref 70–99)
Phosphorus: 4.1 mg/dL (ref 2.5–4.6)
Potassium: 3.8 mmol/L (ref 3.5–5.1)
Sodium: 140 mmol/L (ref 135–145)

## 2021-08-30 LAB — URINE CULTURE: Culture: NO GROWTH

## 2021-08-30 LAB — APTT
aPTT: 57 seconds — ABNORMAL HIGH (ref 24–36)
aPTT: 66 seconds — ABNORMAL HIGH (ref 24–36)

## 2021-08-30 MED ORDER — FUROSEMIDE 10 MG/ML IJ SOLN
40.0000 mg | Freq: Once | INTRAMUSCULAR | Status: AC
  Administered 2021-08-30: 40 mg via INTRAVENOUS
  Filled 2021-08-30: qty 4

## 2021-08-30 MED ORDER — MORPHINE SULFATE (PF) 2 MG/ML IV SOLN
1.0000 mg | Freq: Once | INTRAVENOUS | Status: AC
  Administered 2021-08-30: 1 mg via INTRAVENOUS
  Filled 2021-08-30: qty 1

## 2021-08-30 MED ORDER — KETOROLAC TROMETHAMINE 30 MG/ML IJ SOLN: 30.0000 mg | Freq: Three times a day (TID) | INTRAMUSCULAR | Status: DC | PRN

## 2021-08-30 MED ORDER — KETOROLAC TROMETHAMINE 30 MG/ML IJ SOLN
15.0000 mg | Freq: Four times a day (QID) | INTRAMUSCULAR | Status: DC | PRN
  Administered 2021-08-30: 15 mg via INTRAVENOUS
  Filled 2021-08-30: qty 1

## 2021-08-30 NOTE — Progress Notes (Signed)
?PROGRESS NOTE ? ? ? ?Ricky Hayden  JME:[id number] DOB: July 21, 1945 DOA: 08/28/2021 ?PCP: [facility] ? ? ?Brief Narrative:  ?Ricky Hayden is a 76 y.o. 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  Had gone to the urgent care about a week ago and was prescribed Bactrim - urine cultures showed Proteus mirabilis at that time. Last evening at home patient started having fever and chills and shortness of breath. Was brought to the ER. ?  ?Assessment & Plan: ?  ?Principal Problem: ?  Sepsis ([place]) ?Active Problems: ?  Essential hypertension ?  BPH (benign prostatic hyperplasia) ?  COPD (chronic obstructive pulmonary disease) ([place]) ?  Chronic combined systolic and diastolic CHF, NYHA class 2 ([place]) ?  NICM (nonischemic cardiomyopathy) ([place]) ?  Presence of biventricular cardiac pacemaker ?  UTI (urinary tract infection) ?  ARF (acute renal failure) ([place]) ? ?Sepsis secondary to nonobstructing nephrolithiasis and concurrent UTI, POA  ?-CT scan remarkable for nonobstructive nephrolithiasis ?-Urology consulted, no current indication for procedure -likely would need further outpatient imaging versus cystoscopy in the near future if symptoms continue ?-Liberalize diet, hold off on IV fluids given heart failure history, encourage increased p.o. intake ?-Ceftriaxone ongoing ? ?Acute on chronic kidney disease stage IIIb  ?-Creatinine improving, continue to increase p.o. intake as tolerated ?-Baseline around 1.5 ? ?A-fib on Eliquis  ?-Eliquis on hold due to hematuria ?-Continue on heparin ? ?Combined systolic/diastolic heart failure, not in acute exacerbation  ?-Most recent EF 20 to 25% status post ICD placement ?-Hold IV fluids, continue to liberalize diet increase p.o. intake ?-Holding torsemide and spironolactone ? ?COPD, not in acute exacerbation  ?Not currently  hypoxic, uses as needed oxygen at night at home at baseline. ? ?Hyperlipidemia continue on statins. ? 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 ? ?DVT prophylaxis: Heparin drip ?Code Status: Full ?Family Communication: None present ? ?Status is: Inpatient ? ?Dispo: The patient is from: Home ?             Anticipated d/c is to: Home ?             Anticipated d/c date is: 24 to 48 hours ?             Patient currently not medically stable for discharge ? ?Consultants:  ?Urology ? ?Procedures:  ?None ? ?Antimicrobials:  ?Ceftriaxone ? ?Subjective: ?No acute issues or events overnight, abdominal/flank pain ongoing this morning but denies nausea vomiting diarrhea constipation headache fevers chills or chest pain ? ?Objective: ?Vitals:  ? 08/29/21 1945 08/29/21 2047 08/29/21 2329 08/30/21 0343  ?BP: 124/72  111/63 111/82  ?Pulse: 100  84 89  ?Resp: 20  17 18   ?Temp: 98 ?F (36.7 ?C)  98.4 ?F (36.9 ?C) 98.6 ?F (37 ?C)  ?TempSrc: Oral  Oral Oral  ?SpO2: 97% 98% 97% 100%  ?Weight:      ?Height:      ? ? ?Intake/Output Summary (Last 24 hours) at 08/30/2021 0730 ?Last data filed at 08/29/2021 2328 ?Gross per 24 hour  ?Intake 360 ml  ?Output 1075 ml  ?Net -715 ml  ? ?Filed Weights  ? 08/29/21 0625  ?Weight: 107.2 kg  ? ? ?Examination: ? ?General exam: Appears calm and comfortable  ?Respiratory system: Clear to auscultation. Respiratory effort normal. ?Cardiovascular system: S1 & S2 heard, RRR. No JVD, murmurs, rubs, gallops or clicks. No  pedal edema. ?Gastrointestinal system: Abdomen is nondistended, soft and nontender. No organomegaly or masses felt. Normal bowel sounds heard. ?Central nervous system: Alert and oriented. No focal neurological deficits. ?Extremities: Symmetric 5 x 5 power. ?Skin: No rashes, lesions or ulcers ?Psychiatry: Judgement and insight appear normal. Mood & affect appropriate.  ? ? ? ?Data Reviewed: I have personally reviewed following labs and imaging studies ? ?CBC: ?Recent Labs  ?Lab 08/28/21 ?1884  08/28/21 ?2358 08/29/21 ?0102 08/29/21 ?1660 08/30/21 ?6301  ?WBC 7.6  --   --  8.5 12.5*  ?NEUTROABS 6.4  --   --  7.4  --   ?HGB 14.6 14.6  15.0 13.3 13.9 13.0  ?HCT 44.5 43.0  44.0 39.0 42.4 39.9  ?MCV 95.3  --   --  95.3 95.0  ?PLT 114*  --   --  97* 105*  ? ?Basic Metabolic Panel: ?Recent Labs  ?Lab 08/28/21 ?6010 08/28/21 ?2358 08/29/21 ?0102 08/29/21 ?9323 08/30/21 ?5573  ?NA 136 137  137 138 135 140  ?K 4.2 4.0  4.0 3.8 4.0 3.8  ?CL 105 104  --  104 106  ?CO2 23  --   --  22 27  ?GLUCOSE 154* 157*  --  154* 115*  ?BUN 29* 31*  --  29* 30*  ?CREATININE 2.52* 2.50*  --  2.43* 2.23*  ?CALCIUM 8.8*  --   --  8.6* 8.7*  ?PHOS  --   --   --   --  4.1  ? ?GFR: ?Estimated Creatinine Clearance: 32.4 mL/min (A) (by C-G formula based on SCr of 2.23 mg/dL (H)). ?Liver Function Tests: ?Recent Labs  ?Lab 08/28/21 ?2202 08/29/21 ?5427 08/30/21 ?0152  ?AST 21 21  --   ?ALT 20 18  --   ?ALKPHOS 62 55  --   ?BILITOT 0.7 0.7  --   ?PROT 7.5 6.9  --   ?ALBUMIN 3.6 3.2* 3.0*  ? ?No results for input(s): LIPASE, AMYLASE in the last 168 hours. ?No results for input(s): AMMONIA in the last 168 hours. ?Coagulation Profile: ?Recent Labs  ?Lab 08/28/21 ?2347  ?INR 1.5*  ? ?Cardiac Enzymes: ?No results for input(s): CKTOTAL, CKMB, CKMBINDEX, TROPONINI in the last 168 hours. ?BNP (last 3 results) ?No results for input(s): PROBNP in the last 8760 hours. ?HbA1C: ?No results for input(s): HGBA1C in the last 72 hours. ?CBG: ?No results for input(s): GLUCAP in the last 168 hours. ?Lipid Profile: ?No results for input(s): CHOL, HDL, LDLCALC, TRIG, CHOLHDL, LDLDIRECT in the last 72 hours. ?Thyroid Function Tests: ?No results for input(s): TSH, T4TOTAL, FREET4, T3FREE, THYROIDAB in the last 72 hours. ?Anemia Panel: ?No results for input(s): VITAMINB12, FOLATE, FERRITIN, TIBC, IRON, RETICCTPCT in the last 72 hours. ?Sepsis Labs: ?Recent Labs  ?Lab 08/28/21 ?2347 08/29/21 ?0145  ?LATICACIDVEN 0.9 1.0  ? ? ?Recent Results (from the past 240  hour(s))  ?Urine Culture     Status: Abnormal  ? Collection Time: 08/23/21  3:06 PM  ? Specimen: Urine, Clean Catch  ?Result Value Ref Range Status  ? Specimen Description URINE, CLEAN CATCH  Final  ? Special Requests   Final  ?  NONE ?Performed at [facility], [place] [address] [place], [place] [address] ?  ? Culture 40,000 COLONIES/mL PROTEUS MIRABILIS (A)  Final  ? Report Status 08/27/2021 FINAL  Final  ? Organism ID, Bacteria PROTEUS MIRABILIS (A)  Final  ?    Susceptibility  ? Proteus mirabilis - MIC*  ?  AMPICILLIN <=2  SENSITIVE Sensitive   ?  CEFAZOLIN <=4 SENSITIVE Sensitive   ?  CEFEPIME <=0.12 SENSITIVE Sensitive   ?  CEFTRIAXONE <=0.25 SENSITIVE Sensitive   ?  CIPROFLOXACIN <=0.25 SENSITIVE Sensitive   ?  GENTAMICIN <=1 SENSITIVE Sensitive   ?  IMIPENEM 2 SENSITIVE Sensitive   ?  NITROFURANTOIN RESISTANT Resistant   ?  TRIMETH/SULFA <=20 SENSITIVE Sensitive   ?  AMPICILLIN/SULBACTAM <=2 SENSITIVE Sensitive   ?  PIP/TAZO <=4 SENSITIVE Sensitive   ?  * 40,000 COLONIES/mL PROTEUS MIRABILIS  ?Blood Culture (routine x 2)     Status: None (Preliminary result)  ? Collection Time: 08/28/21 11:45 PM  ? Specimen: BLOOD LEFT FOREARM  ?Result Value Ref Range Status  ? Specimen Description BLOOD LEFT FOREARM  Final  ? Special Requests   Final  ?  BOTTLES DRAWN AEROBIC AND ANAEROBIC Blood Culture adequate volume  ? Culture   Final  ?  NO GROWTH 1 DAY ?Performed at [facility], [place] [address] [place], [place] [address] ?  ? Report Status PENDING  Incomplete  ?Blood Culture (routine x 2)     Status: None (Preliminary result)  ? Collection Time: 08/28/21 11:47 PM  ? Specimen: BLOOD  ?Result Value Ref Range Status  ? Specimen Description BLOOD LEFT ANTECUBITAL  Final  ? Special Requests   Final  ?  BOTTLES DRAWN AEROBIC AND ANAEROBIC Blood Culture adequate volume  ? Culture   Final  ?  NO GROWTH 1 DAY ?Performed at [facility], [place] [address], [place], [place] [address] ?  ? Report Status PENDING   Incomplete  ?Resp Panel by RT-PCR (Flu A&B, Covid) Nasopharyngeal Swab     Status: None  ? Collection Time: 08/29/21 12:13 AM  ? Specimen: Nasopharyngeal Swab; Nasopharyngeal(NP) swabs in vial transport medium

## 2021-08-30 NOTE — Progress Notes (Signed)
ANTICOAGULATION CONSULT NOTE - follow up ? ?Pharmacy Consult for heparin ?Indication: atrial fibrillation ? ?Allergies  ?Allergen Reactions  ? Calcium Channel Blockers Other (See Comments)  ?  Came to hospital in 1995-caused chest pain ?  ? ? ?Patient Measurements: ?Height: 5\' 7"  (170.2 cm) ?Weight: 107.2 kg (236 lb 6.4 oz) ?IBW/kg (Calculated) : 66.1 ?Heparin Dosing Weight: 90kg ? ?Vital Signs: ?Temp: 100 ?F (37.8 ?C) (04/29 0900) ?Temp Source: Oral (04/29 0900) ?BP: 163/62 (04/29 1535) ?Pulse Rate: 107 (04/29 1535) ? ?Labs: ?Recent Labs  ?  08/28/21 ?2347 08/28/21 ?2358 08/29/21 ?0102 08/29/21 ?6314 08/29/21 ?1539 08/30/21 ?0152 08/30/21 ?1259 08/30/21 ?1330  ?HGB 14.6 14.6  15.0   < > 13.9  --  13.0 13.2  --   ?HCT 44.5 43.0  44.0   < > 42.4  --  39.9 40.0  --   ?PLT 114*  --   --  97*  --  105* 101*  --   ?APTT 34  --   --   --  39* 57*  --  66*  ?LABPROT 18.1*  --   --   --   --   --   --   --   ?INR 1.5*  --   --   --   --   --   --   --   ?HEPARINUNFRC  --   --   --   --  >1.10* >1.10*  --  1.06*  ?CREATININE 2.52* 2.50*  --  2.43*  --  2.23*  --   --   ?TROPONINIHS 41*  --   --  51*  --   --   --   --   ? < > = values in this interval not displayed.  ? ? ? ?Estimated Creatinine Clearance: 32.4 mL/min (A) (by C-G formula based on SCr of 2.23 mg/dL (H)). ? ? ?Medical History: ?Past Medical History:  ?Diagnosis Date  ? Arthritis   ? osteoarthritis of left knee  ? Ascending aorta dilatation (HCC)   ? 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 Cardiac arrhythmia   ? life threatening, secondary to CCB vs b- blockers  ? Cardiomyopathy ([place])   ? Chronic joint pain   ? Chronic systolic CHF (congestive heart failure) ([place])   ? CKD (chronic kidney disease), stage III ([place])   ? COPD (chronic obstructive pulmonary disease) ([place])   ? Coronary artery disease   ? Dilated aortic root ([place])   ? Enlarged prostate   ? 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 GERD (gastroesophageal reflux disease)   ? Gout   ? Hiatal hernia    ? Hyperlipidemia   ? Hypertension   ? Intracranial hematoma ([place]) 1995  ? history of, s/p evacuation by Dr. Sherwood Gambler  ? Nocturia   ? Obesity   ? Pansinusitis   ? a.  complicated by brain abscess and bleeding requiring craniotomy in 1995.  ? PONV (postoperative nausea and vomiting)   ? Sinusitis   ? s/p ethmoidectomy and nasal septoplasty  ? Vertigo   ? intermitantly  ? ? ?Assessment: ?76yo male c/o worsening SOB, admitted for possible sepsis d/t Proteus UTI, also endorses hematuria >> to transition from Eliquis for Afib to heparin; last dose of Eliquis taken 4/27 1900. ? ?CBC : stable, PLT remain low 101 ? ?HL came back at 1.06, PTT 66. Therapeutic PTT but not correlating yet. Will confirm with level in AM ? ?Goal of Therapy:  ?  Heparin level 0.3-0.5 units/ml ?aPTT 66-85 seconds ?Monitor platelets by anticoagulation protocol: Yes ?  ?Plan:  ?Continue Heparin infusion 1900 units/hr. ?Monitor heparin levels, aPTT (while Eliquis affects anti-Xa), and CBC. ? ? ?Onnie Boer, PharmD, BCIDP, AAHIVP, CPP ?Infectious Disease Pharmacist ?08/30/2021 4:22 PM ? ? ? ? ?

## 2021-08-30 NOTE — Progress Notes (Incomplete)
ANTICOAGULATION CONSULT NOTE - follow up ? ?Pharmacy Consult for heparin ?Indication: atrial fibrillation ? ?Allergies  ?Allergen Reactions  ? Calcium Channel Blockers Other (See Comments)  ?  Came to hospital in 1995-caused chest pain ?  ? ? ?Patient Measurements: ?Height: 5\' 7"  (170.2 cm) ?Weight: 107.2 kg (236 lb 6.4 oz) ?IBW/kg (Calculated) : 66.1 ?Heparin Dosing Weight: 90kg ? ?Vital Signs: ?Temp: 100 ?F (37.8 ?C) (04/29 0900) ?Temp Source: Oral (04/29 0900) ?BP: 139/69 (04/29 1400) ?Pulse Rate: 83 (04/29 1400) ? ?Labs: ?Recent Labs  ?  08/28/21 ?2347 08/28/21 ?2358 08/29/21 ?0102 08/29/21 ?7867 08/29/21 ?1539 08/30/21 ?0152 08/30/21 ?1259 08/30/21 ?1330  ?HGB 14.6 14.6  15.0   < > 13.9  --  13.0 13.2  --   ?HCT 44.5 43.0  44.0   < > 42.4  --  39.9 40.0  --   ?PLT 114*  --   --  97*  --  105* 101*  --   ?APTT 34  --   --   --  39* 57*  --   --   ?LABPROT 18.1*  --   --   --   --   --   --   --   ?INR 1.5*  --   --   --   --   --   --   --   ?HEPARINUNFRC  --   --   --   --  >1.10* >1.10*  --  1.06*  ?CREATININE 2.52* 2.50*  --  2.43*  --  2.23*  --   --   ?TROPONINIHS 41*  --   --  51*  --   --   --   --   ? < > = values in this interval not displayed.  ? ? ? ?Estimated Creatinine Clearance: 32.4 mL/min (A) (by C-G formula based on SCr of 2.23 mg/dL (H)). ? ? ?Medical History: ?Past Medical History:  ?Diagnosis Date  ? Arthritis   ? osteoarthritis of left knee  ? Ascending aorta dilatation (HCC)   ? 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 Cardiac arrhythmia   ? life threatening, secondary to CCB vs b- blockers  ? Cardiomyopathy ([place])   ? Chronic joint pain   ? Chronic systolic CHF (congestive heart failure) ([place])   ? CKD (chronic kidney disease), stage III ([place])   ? COPD (chronic obstructive pulmonary disease) ([place])   ? Coronary artery disease   ? Dilated aortic root ([place])   ? Enlarged prostate   ? 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 GERD (gastroesophageal reflux disease)   ? Gout   ? Hiatal hernia    ? Hyperlipidemia   ? Hypertension   ? Intracranial hematoma ([place]) 1995  ? history of, s/p evacuation by Dr. Sherwood Gambler  ? Nocturia   ? Obesity   ? Pansinusitis   ? a.  complicated by brain abscess and bleeding requiring craniotomy in 1995.  ? PONV (postoperative nausea and vomiting)   ? Sinusitis   ? s/p ethmoidectomy and nasal septoplasty  ? Vertigo   ? intermitantly  ? ? ?Assessment: ?76yo male c/o worsening SOB, admitted for possible sepsis d/t Proteus UTI, also endorses hematuria >> to transition from Eliquis for Afib to heparin; last dose of Eliquis taken 4/27 1900. Pharmacy consulted to dose and manage heparin infusion. ? ?Heparin level 1.06 ?aPTT  ?Heparin level and aPTT not correlating at this time due to recent apixaban administration.  ?Current  heparin infusion rate: 1900 units/hr ?CBC stable ?No issues or interruptions with IV heparin infusion and no bleeding noted per RN.  ? ?Goal of Therapy:  ?Heparin level 0.3-0.5 units/ml ?aPTT 66-85 seconds ?Monitor platelets by anticoagulation protocol: Yes ?  ?Plan:  ?Continue Heparin infusion to 1900 units/hr. ?Check 8 hour aPTT/ HL ?Monitor heparin levels, aPTT (until correlating), and CBC. ? ?Luisa Hart, PharmD, BCPS ?Clinical Pharmacist ?08/30/2021 3:09 PM  ? ?Please refer to [facility] for pharmacy phone number  ?

## 2021-08-30 NOTE — Progress Notes (Signed)
?   08/30/21 0742  ?Assess: MEWS Score  ?Temp (!) 102.1 ?F (38.9 ?C)  ?BP (!) 120/57  ?Pulse Rate (!) 113  ?ECG Heart Rate (!) 109  ?Resp (!) 26  ?SpO2 97 %  ?Assess: MEWS Score  ?MEWS Temp 2  ?MEWS Systolic 0  ?MEWS Pulse 1  ?MEWS RR 2  ?MEWS LOC 0  ?MEWS Score 5  ?MEWS Score Color Red  ?Assess: if the MEWS score is Yellow or Red  ?Were vital signs taken at a resting state? Yes  ?Focused Assessment Change from prior assessment (see assessment flowsheet)  ?Early Detection of Sepsis Score *See Row Information* High  ?MEWS guidelines implemented *See Row Information* Yes  ?Treat  ?MEWS Interventions Administered scheduled meds/treatments  ?Pain Scale 0-10  ?Pain Score 4  ?Pain Location Back  ?Escalate  ?MEWS: Escalate Red: discuss with charge nurse/RN and provider, consider discussing with RRT  ?Notify: Charge Nurse/RN  ?Name of Charge Nurse/RN Notified Katharine Look  ?Date Charge Nurse/RN Notified 08/30/21  ?Time Charge Nurse/RN Notified [phone]  ?Notify: Provider  ?Provider Name/Title Dr Jimmye Norman  ?Date Provider Notified 08/30/21  ?Time Provider Notified [phone]  ?Notification Type Page  ?Notification Reason Change in status  ?Provider response Evaluate remotely  ?Date of Provider Response 08/30/21  ?Time of Provider Response 0745  ?Document  ?Patient Outcome Not stable and remains on department  ?Progress note created (see row info) Yes  ? ?Will give scheduled and PRN meds, and continue to monitor ?

## 2021-08-30 NOTE — Plan of Care (Signed)

## 2021-08-30 NOTE — Progress Notes (Signed)
ANTICOAGULATION CONSULT NOTE - follow up ? ?Pharmacy Consult for heparin ?Indication: atrial fibrillation ? ?Allergies  ?Allergen Reactions  ? Calcium Channel Blockers Other (See Comments)  ?  Came to hospital in 1995-caused chest pain ?  ? ? ?Patient Measurements: ?Height: 5\' 7"  (170.2 cm) ?Weight: 107.2 kg (236 lb 6.4 oz) ?IBW/kg (Calculated) : 66.1 ?Heparin Dosing Weight: 90kg ? ?Vital Signs: ?Temp: 98.4 ?F (36.9 ?C) (04/28 2329) ?Temp Source: Oral (04/28 2329) ?BP: 111/63 (04/28 2329) ?Pulse Rate: 84 (04/28 2329) ? ?Labs: ?Recent Labs  ?  08/28/21 ?2347 08/28/21 ?2358 08/29/21 ?0102 08/29/21 ?1884 08/29/21 ?1539 08/30/21 ?0152  ?HGB 14.6 14.6  15.0 13.3 13.9  --  13.0  ?HCT 44.5 43.0  44.0 39.0 42.4  --  39.9  ?PLT 114*  --   --  97*  --  105*  ?APTT 34  --   --   --  39* 57*  ?LABPROT 18.1*  --   --   --   --   --   ?INR 1.5*  --   --   --   --   --   ?HEPARINUNFRC  --   --   --   --  >1.10* >1.10*  ?CREATININE 2.52* 2.50*  --  2.43*  --  2.23*  ?TROPONINIHS 41*  --   --  51*  --   --   ? ? ? ?Estimated Creatinine Clearance: 32.4 mL/min (A) (by C-G formula based on SCr of 2.23 mg/dL (H)). ? ? ?Medical History: ?Past Medical History:  ?Diagnosis Date  ? Arthritis   ? osteoarthritis of left knee  ? Ascending aorta dilatation (HCC)   ? 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 Cardiac arrhythmia   ? life threatening, secondary to CCB vs b- blockers  ? Cardiomyopathy ([place])   ? Chronic joint pain   ? Chronic systolic CHF (congestive heart failure) ([place])   ? CKD (chronic kidney disease), stage III ([place])   ? COPD (chronic obstructive pulmonary disease) ([place])   ? Coronary artery disease   ? Dilated aortic root ([place])   ? Enlarged prostate   ? 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 GERD (gastroesophageal reflux disease)   ? Gout   ? Hiatal hernia   ? Hyperlipidemia   ? Hypertension   ? Intracranial hematoma ([place]) 1995  ? history of, s/p evacuation by Dr. Sherwood Gambler  ? Nocturia   ? Obesity   ? Pansinusitis   ? a.   complicated by brain abscess and bleeding requiring craniotomy in 1995.  ? PONV (postoperative nausea and vomiting)   ? Sinusitis   ? s/p ethmoidectomy and nasal septoplasty  ? Vertigo   ? intermitantly  ? ? ?Assessment: ?76yo male c/o worsening SOB, admitted for possible sepsis d/t Proteus UTI, also endorses hematuria >> to transition from Eliquis for Afib to heparin; last dose of Eliquis taken 4/27 1900. ? ?CBC : stable, PLT remain low 105 ? ?Heparin level is >1.1 and a/PTT = 57 sec levels not correlating at this time due to previously taking apixaban.  Will monitor heparin using aPTT for now.   ?No issues or interruptions with IV heparin infusion and no bleeding noted per RN.  ? ?Goal of Therapy:  ?Heparin level 0.3-0.5 units/ml ?aPTT 66-85 seconds ?Monitor platelets by anticoagulation protocol: Yes ?  ?Plan:  ?Increase Heparin infusion to 1900 units/hr. ?Check 8 hour aPTT/ HL ?Monitor heparin levels, aPTT (while Eliquis affects anti-Xa), and CBC. ? ? ?  Georga Bora, PharmD ?Clinical Pharmacist ?08/30/2021 3:15 AM ?Please check AMION for all [facility] numbers ? ? ? ?

## 2021-08-31 DIAGNOSIS — I5042 Chronic combined systolic (congestive) and diastolic (congestive) heart failure: Secondary | ICD-10-CM | POA: Diagnosis not present

## 2021-08-31 DIAGNOSIS — N179 Acute kidney failure, unspecified: Secondary | ICD-10-CM | POA: Diagnosis not present

## 2021-08-31 DIAGNOSIS — N400 Enlarged prostate without lower urinary tract symptoms: Secondary | ICD-10-CM | POA: Diagnosis not present

## 2021-08-31 DIAGNOSIS — A419 Sepsis, unspecified organism: Secondary | ICD-10-CM | POA: Diagnosis not present

## 2021-08-31 LAB — APTT
aPTT: 60 seconds — ABNORMAL HIGH (ref 24–36)
aPTT: 54 seconds — ABNORMAL HIGH (ref 24–36)
aPTT: 44 seconds — ABNORMAL HIGH (ref 24–36)

## 2021-08-31 LAB — CBC
HCT: 37 % — ABNORMAL LOW (ref 39.0–52.0)
HCT: 37.3 % — ABNORMAL LOW (ref 39.0–52.0)
Hemoglobin: 12 g/dL — ABNORMAL LOW (ref 13.0–17.0)
Hemoglobin: 12.3 g/dL — ABNORMAL LOW (ref 13.0–17.0)
MCH: 31 pg (ref 26.0–34.0)
MCH: 31.5 pg (ref 26.0–34.0)
MCHC: 32.4 g/dL (ref 30.0–36.0)
MCHC: 33 g/dL (ref 30.0–36.0)
MCV: 95.6 fL (ref 80.0–100.0)
MCV: 95.6 fL (ref 80.0–100.0)
Platelets: 91 10*3/uL — ABNORMAL LOW (ref 150–400)
Platelets: 85 10*3/uL — ABNORMAL LOW (ref 150–400)
RBC: 3.87 MIL/uL — ABNORMAL LOW (ref 4.22–5.81)
RBC: 3.9 MIL/uL — ABNORMAL LOW (ref 4.22–5.81)
RDW: 13.6 % (ref 11.5–15.5)
RDW: 13.4 % (ref 11.5–15.5)
WBC: 9.8 10*3/uL (ref 4.0–10.5)
WBC: 9 10*3/uL (ref 4.0–10.5)
nRBC: 0 % (ref 0.0–0.2)
nRBC: 0 % (ref 0.0–0.2)

## 2021-08-31 LAB — BASIC METABOLIC PANEL
Anion gap: 8 (ref 5–15)
BUN: 39 mg/dL — ABNORMAL HIGH (ref 8–23)
CO2: 27 mmol/L (ref 22–32)
Calcium: 8.4 mg/dL — ABNORMAL LOW (ref 8.9–10.3)
Chloride: 102 mmol/L (ref 98–111)
Creatinine, Ser: 3.18 mg/dL — ABNORMAL HIGH (ref 0.61–1.24)
GFR, Estimated: 19 mL/min — ABNORMAL LOW (ref >60)
Glucose, Bld: 136 mg/dL — ABNORMAL HIGH (ref 70–99)
Potassium: 4 mmol/L (ref 3.5–5.1)
Sodium: 137 mmol/L (ref 135–145)

## 2021-08-31 LAB — HEPARIN LEVEL (UNFRACTIONATED)
Heparin Unfractionated: 0.88 IU/mL — ABNORMAL HIGH (ref 0.30–0.70)
Heparin Unfractionated: 0.81 IU/mL — ABNORMAL HIGH (ref 0.30–0.70)

## 2021-08-31 MED ORDER — CHLORHEXIDINE GLUCONATE CLOTH 2 % EX PADS
6.0000 | MEDICATED_PAD | Freq: Every day | CUTANEOUS | Status: DC
  Administered 2021-09-01 – 2021-09-19 (×20): 6 via TOPICAL

## 2021-08-31 MED ORDER — TRAMADOL HCL 50 MG PO TABS
50.0000 mg | ORAL_TABLET | Freq: Four times a day (QID) | ORAL | Status: DC | PRN
  Administered 2021-09-01: 50 mg via ORAL
  Filled 2021-08-31: qty 1

## 2021-08-31 MED ORDER — MORPHINE SULFATE (PF) 2 MG/ML IV SOLN
2.0000 mg | INTRAVENOUS | Status: DC | PRN
Start: 2021-08-31 — End: 2021-09-02
  Administered 2021-08-31: 2 mg via INTRAVENOUS
  Filled 2021-08-31: qty 1

## 2021-08-31 NOTE — Progress Notes (Signed)
?PROGRESS NOTE ? ? ? ?Ricky Hayden  PJK:[id number] DOB: [birth year] DOA: 08/28/2021 ?PCP: [facility] ? ? ?Brief Narrative:  ?Ricky Hayden is a 76 y.o. 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  Had gone to the urgent care about a week ago and was prescribed Bactrim - urine cultures showed Proteus mirabilis at that time. Last evening at home patient started having fever and chills and shortness of breath. Was brought to the ER. ?  ?Assessment & Plan: ?  ?Principal Problem: ?  Sepsis ([place]) ?Active Problems: ?  Essential hypertension ?  BPH (benign prostatic hyperplasia) ?  COPD (chronic obstructive pulmonary disease) ([place]) ?  Chronic combined systolic and diastolic CHF, NYHA class 2 ([place]) ?  NICM (nonischemic cardiomyopathy) ([place]) ?  Presence of biventricular cardiac pacemaker ?  UTI (urinary tract infection) ?  ARF (acute renal failure) ([place]) ? ?Sepsis secondary to nonobstructing nephrolithiasis/pyelonephritis and concurrent UTI, POA  ?-CT scan remarkable for nonobstructive nephrolithiasis ?-Urology consulted, no current indication for procedure -likely would need further outpatient imaging versus cystoscopy in the near future if symptoms continue ?-Liberalize diet, hold off on IV fluids given heart failure history, encourage increased p.o. intake ?-Ceftriaxone ongoing ? ?Acute hypoxic respiratory failure secondary to volume overload ?Combined systolic/diastolic heart failure, not in acute exacerbation  ?-Most recent EF 20 to 25% status post ICD placement ?-Hold IV fluids, continue to liberalize diet increase p.o. intake ?-Holding torsemide and spironolactone ?- Lasix x1 yesterday due to acute episode of volume overload (attempting to liberalize diet to increase UOP/improve dehydration). ? ?AKI on chronic kidney disease stage IIIb  ?-Creatinine  up due to extra lasix yesterday - UOP minimal overnight but improving today ?-Baseline around 1.5 - creatinine up to 3.2 today - consider nephrology consult if continues to worsen or UOP drops again ? ?A-fib on Eliquis  ?-Eliquis on hold due to hematuria ?-Continue on heparin ? ?COPD, not in acute exacerbation  ?Not currently hypoxic, uses as needed oxygen at night at home at baseline. ? ?Hyperlipidemia continue on statins. ? 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 ? ?DVT prophylaxis: Heparin drip ?Code Status: Full ?Family Communication: None present ? ?Status is: Inpatient ? ?Dispo: The patient is from: Home ?             Anticipated d/c is to: Home ?             Anticipated d/c date is: 24 to 48 hours ?             Patient currently not medically stable for discharge ? ?Consultants:  ?Urology ? ?Procedures:  ?None ? ?Antimicrobials:  ?Ceftriaxone ? ?Subjective: ?No acute issues or events overnight, abdominal/flank pain ongoing this morning but denies nausea vomiting diarrhea constipation headache fevers chills or chest pain ? ?Objective: ?Vitals:  ? 08/30/21 2356 08/31/21 0400 08/31/21 0515 08/31/21 0734  ?BP: (!) 112/59 (!) 117/54 (!) 114/53 130/60  ?Pulse: 72 68 77 76  ?Resp: 18 14 (!) 23 20  ?Temp: 99.1 ?F (37.3 ?C)  97.6 ?F (36.4 ?C) 99 ?F (37.2 ?C)  ?TempSrc: Axillary  Oral Oral  ?SpO2: 97% 98% 95% 100%  ?Weight:      ?Height:      ? ? ?Intake/Output Summary (Last 24 hours) at 08/31/2021 0738 ?Last data filed at 08/31/2021 8099 ?Gross per 24 hour  ?Intake 1248.43 ml  ?  Output 0 ml  ?Net 1248.43 ml  ? ? ?Filed Weights  ? 08/29/21 0625  ?Weight: 107.2 kg  ? ? ?Examination: ? ?General exam: Appears calm and comfortable  ?Respiratory system: Clear to auscultation. Respiratory effort normal. ?Cardiovascular system: S1 & S2 heard, RRR. No JVD, murmurs, rubs, gallops or clicks. No pedal edema. ?Gastrointestinal system: Abdomen is nondistended, soft and nontender. No organomegaly or masses felt. Normal bowel  sounds heard. ?Central nervous system: Alert and oriented. No focal neurological deficits. ?Extremities: Symmetric 5 x 5 power. ?Skin: No rashes, lesions or ulcers ?Psychiatry: Judgement and insight appear normal. Mood & affect appropriate.  ? ? ? ?Data Reviewed: I have personally reviewed following labs and imaging studies ? ?CBC: ?Recent Labs  ?Lab 08/28/21 ?1017 08/28/21 ?2358 08/29/21 ?0102 08/29/21 ?5102 08/30/21 ?5852 08/30/21 ?1259 08/31/21 ?7782  ?WBC 7.6  --   --  8.5 12.5* 10.2 9.8  ?NEUTROABS 6.4  --   --  7.4  --   --   --   ?HGB 14.6   < > 13.3 13.9 13.0 13.2 12.0*  ?HCT 44.5   < > 39.0 42.4 39.9 40.0 37.0*  ?MCV 95.3  --   --  95.3 95.0 95.7 95.6  ?PLT 114*  --   --  97* 105* 101* 91*  ? < > = values in this interval not displayed.  ? ? ?Basic Metabolic Panel: ?Recent Labs  ?Lab 08/28/21 ?4235 08/28/21 ?2358 08/29/21 ?0102 08/29/21 ?3614 08/30/21 ?4315 08/31/21 ?4008  ?NA 136 137  137 138 135 140 137  ?K 4.2 4.0  4.0 3.8 4.0 3.8 4.0  ?CL 105 104  --  104 106 102  ?CO2 23  --   --  22 27 27   ?GLUCOSE 154* 157*  --  154* 115* 136*  ?BUN 29* 31*  --  29* 30* 39*  ?CREATININE 2.52* 2.50*  --  2.43* 2.23* 3.18*  ?CALCIUM 8.8*  --   --  8.6* 8.7* 8.4*  ?PHOS  --   --   --   --  4.1  --   ? ? ?GFR: ?Estimated Creatinine Clearance: 22.7 mL/min (A) (by C-G formula based on SCr of 3.18 mg/dL (H)). ?Liver Function Tests: ?Recent Labs  ?Lab 08/28/21 ?6761 08/29/21 ?9509 08/30/21 ?0152  ?AST 21 21  --   ?ALT 20 18  --   ?ALKPHOS 62 55  --   ?BILITOT 0.7 0.7  --   ?PROT 7.5 6.9  --   ?ALBUMIN 3.6 3.2* 3.0*  ? ? ?No results for input(s): LIPASE, AMYLASE in the last 168 hours. ?No results for input(s): AMMONIA in the last 168 hours. ?Coagulation Profile: ?Recent Labs  ?Lab 08/28/21 ?2347  ?INR 1.5*  ? ? ?Cardiac Enzymes: ?No results for input(s): CKTOTAL, CKMB, CKMBINDEX, TROPONINI in the last 168 hours. ?BNP (last 3 results) ?No results for input(s): PROBNP in the last 8760 hours. ?HbA1C: ?No results for input(s):  HGBA1C in the last 72 hours. ?CBG: ?No results for input(s): GLUCAP in the last 168 hours. ?Lipid Profile: ?No results for input(s): CHOL, HDL, LDLCALC, TRIG, CHOLHDL, LDLDIRECT in the last 72 hours. ?Thyroid Function Tests: ?No results for input(s): TSH, T4TOTAL, FREET4, T3FREE, THYROIDAB in the last 72 hours. ?Anemia Panel: ?No results for input(s): VITAMINB12, FOLATE, FERRITIN, TIBC, IRON, RETICCTPCT in the last 72 hours. ?Sepsis Labs: ?Recent Labs  ?Lab 08/28/21 ?2347 08/29/21 ?0145  ?LATICACIDVEN 0.9 1.0  ? ? ? ?Recent Results (from the past 240 hour(s))  ?Urine  Culture     Status: Abnormal  ? Collection Time: 08/23/21  3:06 PM  ? Specimen: Urine, Clean Catch  ?Result Value Ref Range Status  ? Specimen Description URINE, CLEAN CATCH  Final  ? Special Requests   Final  ?  NONE ?Performed at [facility], [place] [address], [place], [place] [address] ?  ? Culture 40,000 COLONIES/mL PROTEUS MIRABILIS (A)  Final  ? Report Status 08/27/2021 FINAL  Final  ? Organism ID, Bacteria PROTEUS MIRABILIS (A)  Final  ?    Susceptibility  ? Proteus mirabilis - MIC*  ?  AMPICILLIN <=2 SENSITIVE Sensitive   ?  CEFAZOLIN <=4 SENSITIVE Sensitive   ?  CEFEPIME <=0.12 SENSITIVE Sensitive   ?  CEFTRIAXONE <=0.25 SENSITIVE Sensitive   ?  CIPROFLOXACIN <=0.25 SENSITIVE Sensitive   ?  GENTAMICIN <=1 SENSITIVE Sensitive   ?  IMIPENEM 2 SENSITIVE Sensitive   ?  NITROFURANTOIN RESISTANT Resistant   ?  TRIMETH/SULFA <=20 SENSITIVE Sensitive   ?  AMPICILLIN/SULBACTAM <=2 SENSITIVE Sensitive   ?  PIP/TAZO <=4 SENSITIVE Sensitive   ?  * 40,000 COLONIES/mL PROTEUS MIRABILIS  ?Urine Culture     Status: None  ? Collection Time: 08/28/21 11:38 PM  ? Specimen: In/Out Cath Urine  ?Result Value Ref Range Status  ? Specimen Description IN/OUT CATH URINE  Final  ? Special Requests NONE  Final  ? Culture   Final  ?  NO GROWTH ?Performed at [facility], [place] [address], [place], [place] [address] ?  ? Report Status 08/30/2021 FINAL  Final   ?Blood Culture (routine x 2)     Status: None (Preliminary result)  ? Collection Time: 08/28/21 11:45 PM  ? Specimen: BLOOD LEFT FOREARM  ?Result Value Ref Range Status  ? Specimen Description BLOOD LEFT FORE

## 2021-08-31 NOTE — Progress Notes (Signed)
Decreased urine out-put was noticed on reassessment. Bladder scan showed 0 volume, patient was encouraged to void.  ? ?Will inform [facility] and continue to monitor.  ?

## 2021-08-31 NOTE — Progress Notes (Signed)
Tylenol given early per Dr. Bridgett Larsson.  ? ?

## 2021-08-31 NOTE — Progress Notes (Signed)
ANTICOAGULATION CONSULT NOTE - Follow Up Consult ? ?Pharmacy Consult for heparin ?Indication: atrial fibrillation ? ?Labs: ?Recent Labs  ?  08/28/21 ?2347 08/28/21 ?2358 08/29/21 ?2103 08/29/21 ?1539 08/30/21 ?0152 08/30/21 ?1259 08/30/21 ?1330 08/31/21 ?0313  ?HGB 14.6   < > 13.9  --  13.0 13.2  --  12.0*  ?HCT 44.5   < > 42.4  --  39.9 40.0  --  37.0*  ?PLT 114*  --  97*  --  105* 101*  --  91*  ?APTT 34  --   --    < > 57*  --  66* 60*  ?LABPROT 18.1*  --   --   --   --   --   --   --   ?INR 1.5*  --   --   --   --   --   --   --   ?HEPARINUNFRC  --   --   --    < > >1.10*  --  1.06* 0.88*  ?CREATININE 2.52*   < > 2.43*  --  2.23*  --   --  3.18*  ?TROPONINIHS 41*  --  51*  --   --   --   --   --   ? < > = values in this interval not displayed.  ? ? ?Assessment: ?76yo male subtherapeutic on heparin after one PTT at low end of goal; RN reports that pt has not had any hematuria overnight but also has not made much urine during this shift. ? ?Goal of Therapy:  ?aPTT 66-85 seconds ?  ?Plan:  ?Will increase heparin infusion by 1 unit/kg/hr to 2000 units/hr and check level in 8 hours.   ? ?Wynona Neat, PharmD, BCPS  ?08/31/2021,4:59 AM ? ? ?

## 2021-08-31 NOTE — Progress Notes (Addendum)
ANTICOAGULATION CONSULT NOTE - Follow Up Consult ? ?Pharmacy Consult for heparin ?Indication: atrial fibrillation ? ?Labs: ?Recent Labs  ?  08/28/21 ?2347 08/28/21 ?2358 08/29/21 ?6811 08/29/21 ?1539 08/30/21 ?0152 08/30/21 ?1259 08/30/21 ?1330 08/31/21 ?0313 08/31/21 ?1201  ?HGB 14.6   < > 13.9  --  13.0 13.2  --  12.0* 12.3*  ?HCT 44.5   < > 42.4  --  39.9 40.0  --  37.0* 37.3*  ?PLT 114*  --  97*  --  105* 101*  --  91* 85*  ?APTT 34  --   --    < > 57*  --  66* 60* 54*  ?LABPROT 18.1*  --   --   --   --   --   --   --   --   ?INR 1.5*  --   --   --   --   --   --   --   --   ?HEPARINUNFRC  --   --   --    < > >1.10*  --  1.06* 0.88*  --   ?CREATININE 2.52*   < > 2.43*  --  2.23*  --   --  3.18*  --   ?TROPONINIHS 41*  --  51*  --   --   --   --   --   --   ? < > = values in this interval not displayed.  ? ? ? ?Assessment: ?76yo male c/o worsening SOB, admitted for possible sepsis d/t Proteus UTI, also endorses hematuria >> to transition from Eliquis for Afib to heparin; last dose of Eliquis taken 4/27 1900. ? ?aPTT 54, subtherapeutic ?Current heparin infusion rate: 2000 units/hr ?No s/sx of bleeding or hematuria per RN report ? ?Goal of Therapy:  ?aPTT 66-85 seconds ?Heparin level 0.3-0.7 ?Monitor platelets by anticoagulation protocol: Yes ?  ?Plan:  ?Increase heparin infusion to 2300 units/hr ?Check aPTT and heparin level in 8 hours ?Monitor aPTT, heparin level, and CBC daily ?Continue to monitor for s/sx of bleeding ? ?Luisa Hart, PharmD, BCPS ?Clinical Pharmacist ?08/31/2021 1:48 PM  ? ?Please refer to [facility] for pharmacy phone number  ?

## 2021-08-31 NOTE — Progress Notes (Signed)
Patient's daughter, [name], requesting to be updated via phone tomorrow regarding plan of care, discharge information, and home health assistance. RN notified family a note would be placed for case management to see.  ?[place] C Nayra Coury  ?

## 2021-08-31 NOTE — Progress Notes (Signed)
ANTICOAGULATION CONSULT NOTE - Follow Up Consult ? ?Pharmacy Consult for heparin ?Indication: atrial fibrillation ? ?Labs: ?Recent Labs  ?  08/28/21 ?2347 08/28/21 ?2358 08/29/21 ?2993 08/29/21 ?1539 08/30/21 ?0152 08/30/21 ?1259 08/30/21 ?1330 08/31/21 ?0313 08/31/21 ?1201 08/31/21 ?2137  ?HGB 14.6   < > 13.9  --  13.0 13.2  --  12.0* 12.3*  --   ?HCT 44.5   < > 42.4  --  39.9 40.0  --  37.0* 37.3*  --   ?PLT 114*  --  97*  --  105* 101*  --  91* 85*  --   ?APTT 34  --   --    < > 57*  --  66* 60* 54* 44*  ?LABPROT 18.1*  --   --   --   --   --   --   --   --   --   ?INR 1.5*  --   --   --   --   --   --   --   --   --   ?HEPARINUNFRC  --   --   --    < > >1.10*  --  1.06* 0.88*  --  0.81*  ?CREATININE 2.52*   < > 2.43*  --  2.23*  --   --  3.18*  --   --   ?TROPONINIHS 41*  --  51*  --   --   --   --   --   --   --   ? < > = values in this interval not displayed.  ? ? ? ?Assessment: ?76yo male c/o worsening SOB, admitted for possible sepsis d/t Proteus UTI, also endorses hematuria >> to transition from Eliquis for Afib to heparin; last dose of Eliquis taken 4/27 1900. ? ?aPTT 54, subtherapeutic despite rate increase earlier. ?Current heparin infusion rate: 2300 units/hr ?No s/sx of bleeding or hematuria per RN report ? ?Goal of Therapy:  ?aPTT 66-85 seconds ?Heparin level 0.3-0.7 ?Monitor platelets by anticoagulation protocol: Yes ?  ?Plan:  ?Increase heparin infusion to 2450 units/hr ?Check aPTT and heparin level in 8 hours ?Monitor aPTT, heparin level, and CBC daily ?Continue to monitor for s/sx of bleeding ? ?[place] Crane, Pharm D, BCPS, BCCP ?Clinical Pharmacist ? 08/31/2021 10:45 PM  ? ?[facility] pharmacy phone numbers are listed on [email or web address] ? ?

## 2021-09-01 DIAGNOSIS — N179 Acute kidney failure, unspecified: Secondary | ICD-10-CM | POA: Diagnosis not present

## 2021-09-01 DIAGNOSIS — I5042 Chronic combined systolic (congestive) and diastolic (congestive) heart failure: Secondary | ICD-10-CM | POA: Diagnosis not present

## 2021-09-01 DIAGNOSIS — A419 Sepsis, unspecified organism: Secondary | ICD-10-CM | POA: Diagnosis not present

## 2021-09-01 DIAGNOSIS — N400 Enlarged prostate without lower urinary tract symptoms: Secondary | ICD-10-CM | POA: Diagnosis not present

## 2021-09-01 LAB — BLOOD CULTURE ID PANEL (REFLEXED) - BCID2

## 2021-09-01 LAB — BASIC METABOLIC PANEL
Anion gap: 9 (ref 5–15)
BUN: 44 mg/dL — ABNORMAL HIGH (ref 8–23)
CO2: 26 mmol/L (ref 22–32)
Calcium: 8.4 mg/dL — ABNORMAL LOW (ref 8.9–10.3)
Chloride: 102 mmol/L (ref 98–111)
Creatinine, Ser: 3.25 mg/dL — ABNORMAL HIGH (ref 0.61–1.24)
GFR, Estimated: 19 mL/min — ABNORMAL LOW (ref >60)
Glucose, Bld: 160 mg/dL — ABNORMAL HIGH (ref 70–99)
Potassium: 4.3 mmol/L (ref 3.5–5.1)
Sodium: 137 mmol/L (ref 135–145)

## 2021-09-01 LAB — CBC
HCT: 38.3 % — ABNORMAL LOW (ref 39.0–52.0)
Hemoglobin: 12.5 g/dL — ABNORMAL LOW (ref 13.0–17.0)
MCH: 31.1 pg (ref 26.0–34.0)
MCHC: 32.6 g/dL (ref 30.0–36.0)
MCV: 95.3 fL (ref 80.0–100.0)
Platelets: 88 10*3/uL — ABNORMAL LOW (ref 150–400)
RBC: 4.02 MIL/uL — ABNORMAL LOW (ref 4.22–5.81)
RDW: 13.1 % (ref 11.5–15.5)
WBC: 11.3 10*3/uL — ABNORMAL HIGH (ref 4.0–10.5)
nRBC: 0 % (ref 0.0–0.2)

## 2021-09-01 LAB — APTT: aPTT: 51 seconds — ABNORMAL HIGH (ref 24–36)

## 2021-09-01 LAB — HEPARIN LEVEL (UNFRACTIONATED): Heparin Unfractionated: 0.69 IU/mL (ref 0.30–0.70)

## 2021-09-01 MED ORDER — SODIUM CHLORIDE 0.9 % IV SOLN
2.0000 g | INTRAVENOUS | Status: DC
  Administered 2021-09-01: 2 g via INTRAVENOUS
  Filled 2021-09-01: qty 20

## 2021-09-01 MED ORDER — APIXABAN 5 MG PO TABS
5.0000 mg | ORAL_TABLET | Freq: Two times a day (BID) | ORAL | Status: DC
  Administered 2021-09-01 – 2021-09-02 (×4): 5 mg via ORAL
  Filled 2021-09-01 (×4): qty 1

## 2021-09-01 NOTE — Progress Notes (Signed)
ANTICOAGULATION CONSULT NOTE - Follow Up Consult ? ?Pharmacy Consult for heparin > back to Eliquis ?Indication: atrial fibrillation ? ?Labs: ?Recent Labs  ?  08/30/21 ?0152 08/30/21 ?1259 08/31/21 ?0313 08/31/21 ?1201 08/31/21 ?2137 09/01/21 ?0308  ?HGB 13.0   < > 12.0* 12.3*  --  12.5*  ?HCT 39.9   < > 37.0* 37.3*  --  38.3*  ?PLT 105*   < > 91* 85*  --  88*  ?APTT 57*   < > 60* 54* 44* 51*  ?HEPARINUNFRC >1.10*   < > 0.88*  --  0.81* 0.69  ?CREATININE 2.23*  --  3.18*  --   --  3.25*  ? < > = values in this interval not displayed.  ? ? ? ?Assessment: ?76yo male c/o worsening SOB, admitted for possible sepsis d/t Proteus UTI, also endorses hematuria >> to transition from Eliquis for Afib to heparin; last dose of Eliquis taken 4/27 1900. ? ?aPTTs remain subtherapeutic despite high-dose heparin infusion.  RN confirms line patency, no known issues with IV infusion. ? ?No s/sx of bleeding or hematuria per RN report ? ?Goal of Therapy:  ?aPTT 66-85 seconds ?Heparin level 0.3-0.7 ?Monitor platelets by anticoagulation protocol: Yes ?  ?Plan:  ?Stop IV heparin and switch back to apixaban 5 mg BID. ?Of note, prior to admission, pt taking both phenobarbital and apixaban which are known to interact, likely lowering efficacy of apixaban.  Discussed with Dr. Avon Gully, pt will need to follow up with outpatient neurologist to switch to agent other than phenobarbital. ? ?[place] Crane, Pharm D, BCPS, BCCP ?Clinical Pharmacist ? 09/01/2021 10:02 AM  ? ?[facility] pharmacy phone numbers are listed on [email or web address] ? ? ? ?

## 2021-09-01 NOTE — Progress Notes (Addendum)
? ?  Inpatient Rehab Admissions Coordinator : ? ?Per therapy recommendations, patient was screened for CIR candidacy by Kimmora Risenhoover RN MSN.  At this time patient appears to be a potential candidate for CIR. I will place a rehab consult per protocol for full assessment. Please call me with any questions. ? ?Jannat Rosemeyer RN MSN ?Admissions Coordinator ?[phone] ?  ?

## 2021-09-01 NOTE — Progress Notes (Signed)
?   09/01/21 2025  ?Assess: MEWS Score  ?Temp (!) 101.7 ?F (38.7 ?C)  ?Pulse Rate 96  ?ECG Heart Rate 98  ?Resp 20  ?SpO2 94 %  ?O2 Device Nasal Cannula  ?Assess: MEWS Score  ?MEWS Temp 2  ?MEWS Systolic 0  ?MEWS Pulse 0  ?MEWS RR 0  ?MEWS LOC 0  ?MEWS Score 2  ?MEWS Score Color Yellow  ?Assess: if the MEWS score is Yellow or Red  ?Were vital signs taken at a resting state? Yes  ?Focused Assessment No change from prior assessment  ?Early Detection of Sepsis Score *See Row Information* High  ?MEWS guidelines implemented *See Row Information* Yes  ?Treat  ?MEWS Interventions Administered scheduled meds/treatments  ?Pain Scale 0-10  ?Pain Score Asleep  ?Faces Pain Scale 2  ?Pain Type Chronic pain  ?Take Vital Signs  ?Increase Vital Sign Frequency  Yellow: Q 2hr X 2 then Q 4hr X 2, if remains yellow, continue Q 4hrs  ?Escalate  ?MEWS: Escalate Yellow: discuss with charge nurse/RN and consider discussing with provider and RRT  ?Notify: Charge Nurse/RN  ?Name of Charge Nurse/RN Notified Amanda,RN  ?Date Charge Nurse/RN Notified 09/29/21  ?Time Charge Nurse/RN Notified 2033  ?Notify: Provider  ?Provider Name/Title E. Chen,MD  ?Date Provider Notified 09/01/21  ?Time Provider Notified 2049  ?Notification Type Page  ?Notification Reason Critical result  ?Document  ?Patient Outcome Stabilized after interventions  ?Progress note created (see row info) Yes  ? ?Pt temp elevated. E.Chen, MD notified. PRN medication administered. RN will continue to monitor pt. ?

## 2021-09-01 NOTE — Plan of Care (Signed)

## 2021-09-01 NOTE — Evaluation (Signed)
Physical Therapy Evaluation Patient Details Name: Ricky Hayden MRN: [id number] DOB: 1945-09-05 Today's Date: 09/01/2021  History of Present Illness  This 76 yo male from home presented to hospital on 4/28 with fever, chills and SOB. Admitted with sepsis secondary to nonobstructing nephrolithiasis/pyelonephritis and concurrent UTI, acute hypoxic respiratory failure secondary to volume overload, and AKI on CKD.  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  Clinical Impression  Patient presents with lethargy, generalized weakness, cognitive deficits, impaired balance, dyspnea at rest/worsened with exertion, decreased activity tolerance and impaired mobility s/p above. Pt reports needing some assist with ADLs at baseline PRN and using rollator for ambulation PTA. Reports his children stay with him at night. Today, eval session limited due to lethargy. Pt with impaired attention, ability to follow multi step commands, and needing constant stimulus to stay attended to task/awake. Requires Mod A for bed mobility and for standing from elevated bed height. Able to side step along side bed with Min A for support. Would benefit from AIR to maximize independence and mobility prior to return home. Will follow acutely for further assessment pending improvement of arousal.        Recommendations for follow up therapy are one component of a multi-disciplinary discharge planning process, led by the attending physician.  Recommendations may be updated based on patient status, additional functional criteria and insurance authorization.  Follow Up Recommendations Acute inpatient rehab (3hours/day)    Assistance Recommended at Discharge Frequent or constant Supervision/Assistance  Patient can return home with the following  A lot of help with walking and/or transfers;A lot of help with bathing/dressing/bathroom;Assist for transportation;Help with stairs or ramp for  entrance;Direct supervision/assist for medications management;Assistance with cooking/housework;Direct supervision/assist for financial management    Equipment Recommendations None recommended by PT  Recommendations for Other Services       Functional Status Assessment Patient has had a recent decline in their functional status and demonstrates the ability to make significant improvements in function in a reasonable and predictable amount of time.     Precautions / Restrictions Precautions Precautions: Fall;Other (comment) Precaution Comments: watch 02 Restrictions Weight Bearing Restrictions: No      Mobility  Bed Mobility Overal bed mobility: Needs Assistance Bed Mobility: Rolling, Sidelying to Sit, Sit to Supine Rolling: Mod assist Sidelying to sit: HOB elevated, Mod assist   Sit to supine: Mod assist   General bed mobility comments: Step by step cues for sequencing, assist to roll to left and elevate trunk to get to EOB. DIzziness. ASsist to bring LEs into bed.    Transfers Overall transfer level: Needs assistance Equipment used: Rolling walker (2 wheels) Transfers: Sit to/from Stand Sit to Stand: Min assist, From elevated surface           General transfer comment: Min A to power to standing from elevated bed height. Noted to have some twitches and "jumping" of UEs intermittently throughout session/sitting.    Ambulation/Gait Ambulation/Gait assistance: Min assist Gait Distance (Feet): 3 Feet Assistive device: Rolling walker (2 wheels) Gait Pattern/deviations: Trunk flexed, Wide base of support Gait velocity: decreased     General Gait Details: ABle to side step along side bed with Min A for RW management and balance. Difficulty with sequencing. SP02 dropped to 85% on 4L/min 02 [place].  Stairs            Wheelchair Mobility    Modified Rankin (Stroke Patients Only)       Balance Overall balance assessment: Needs  assistance, History of  Falls Sitting-balance support: Feet supported, No upper extremity supported Sitting balance-Leahy Scale: Fair     Standing balance support: During functional activity, Bilateral upper extremity supported Standing balance-Leahy Scale: Poor Standing balance comment: Requires UE support and Min A.                             Pertinent Vitals/Pain Pain Assessment Pain Assessment: No/denies pain    Home Living Family/patient expects to be discharged to:: Private residence Living Arrangements: Children (stay with him at night) Available Help at Discharge: Family;Available PRN/intermittently Type of Home: House Home Access: Stairs to enter Entrance Stairs-Rails: None Entrance Stairs-Number of Steps: 1   Home Layout: One level Home Equipment: Rollator (4 wheels);Hospital bed;Shower seat      Prior Function Prior Level of Function : Needs assist             Mobility Comments: uses rollator for ambulation, children mostly manage IADLs ADLs Comments: Needs help sometimes per report. Drives sometimes.     Hand Dominance   Dominant Hand: Right    Extremity/Trunk Assessment   Upper Extremity Assessment Upper Extremity Assessment: Defer to OT evaluation;Generalized weakness    Lower Extremity Assessment Lower Extremity Assessment: Generalized weakness (functional)       Communication   Communication: No difficulties  Cognition Arousal/Alertness: Lethargic Behavior During Therapy: WFL for tasks assessed/performed Overall Cognitive Status: No family/caregiver present to determine baseline cognitive functioning Area of Impairment: Attention, Problem solving, Following commands                   Current Attention Level: Focused   Following Commands: Follows one step commands with increased time (repetition)     Problem Solving: Slow processing, Difficulty sequencing, Requires verbal cues, Requires tactile cues, Decreased initiation General Comments:  When asked why he falls, "dementia" Difficulty with sequencing, motor planning. Slow processing. Poor attention, reports feeling sleepy. needs stimulus to stay awake/attended to task. "it is still February" but able to state may when told it was a new month.        General Comments General comments (skin integrity, edema, etc.): Supine BP 119/64, sitting BP 103/63, sitting BP post standing 110/61. Sp02 dropped to 85% on 4L/min 02 [place] sitting EOB.    Exercises     Assessment/Plan    PT Assessment Patient needs continued PT services  PT Problem List Decreased strength;Decreased mobility;Cardiopulmonary status limiting activity;Decreased activity tolerance;Decreased balance;Decreased cognition       PT Treatment Interventions Therapeutic exercise;Gait training;Patient/family education;Therapeutic activities;Functional mobility training;Balance training;Cognitive remediation    PT Goals (Current goals can be found in the Care Plan section)  Acute Rehab PT Goals Patient Stated Goal: go home PT Goal Formulation: With patient Time For Goal Achievement: 09/15/21 Potential to Achieve Goals: Fair    Frequency Min 3X/week     Co-evaluation               AM-PAC PT "6 Clicks" Mobility  Outcome Measure Help needed turning from your back to your side while in a flat bed without using bedrails?: A Lot Help needed moving from lying on your back to sitting on the side of a flat bed without using bedrails?: A Lot Help needed moving to and from a bed to a chair (including a wheelchair)?: A Lot Help needed standing up from a chair using your arms (e.g., wheelchair or bedside chair)?: A Lot Help needed to walk in  hospital room?: A Little Help needed climbing 3-5 steps with a railing? : Total 6 Click Score: 12    End of Session Equipment Utilized During Treatment: Gait belt;Oxygen Activity Tolerance: Patient limited by lethargy Patient left: in bed;with call bell/phone within reach;with  bed alarm set Nurse Communication: Mobility status PT Visit Diagnosis: Muscle weakness (generalized) (M62.81);Difficulty in walking, not elsewhere classified (R26.2);Unsteadiness on feet (R26.81)    Time: 7846-9629 PT Time Calculation (min) (ACUTE ONLY): 32 min   Charges:   PT Evaluation $PT Eval Moderate Complexity: 1 Mod PT Treatments $Therapeutic Activity: 8-22 mins        Vale Haven, PT, DPT Acute Rehabilitation Services Secure chat preferred Office [phone]     Blake Divine A Malu Pellegrini 09/01/2021, 12:14 PM

## 2021-09-01 NOTE — Progress Notes (Signed)
Inpatient Rehab Admissions: ? ?Inpatient Rehab Consult received.  I met with pt and son, Montine Circle at the bedside for rehabilitation assessment and to discuss goals and expectations of an inpatient rehab admission.  Pt was lethargic so spoke with son. Son acknowledged understanding of CIR goals and expectations. Son supportive of pt pursuing CIR. Son told [facility] to also contact his sisters, [name] or [name].  Called [name] and left a message; awaiting return call. Will continue to follow. ? ?Signed: ?Gayland Curry, MS, CCC-SLP ?Admissions Coordinator ?[phone] ? ? ?

## 2021-09-01 NOTE — Progress Notes (Signed)
?PROGRESS NOTE ? ? ? ?Ricky Hayden  RKY:[id number] DOB: 1945/08/02 DOA: 08/28/2021 ?PCP: [facility] ? ? ?Brief Narrative:  ?Ricky Hayden is a 76 y.o. 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  Had gone to the urgent care about a week ago and was prescribed Bactrim - urine cultures showed Proteus mirabilis at that time. Last evening at home patient started having fever and chills and shortness of breath. Was brought to the ER. ?  ?Assessment & Plan: ?  ?Principal Problem: ?  Sepsis ([place]) ?Active Problems: ?  Essential hypertension ?  BPH (benign prostatic hyperplasia) ?  COPD (chronic obstructive pulmonary disease) ([place]) ?  Chronic combined systolic and diastolic CHF, NYHA class 2 ([place]) ?  NICM (nonischemic cardiomyopathy) ([place]) ?  Presence of biventricular cardiac pacemaker ?  UTI (urinary tract infection) ?  ARF (acute renal failure) ([place]) ? ?Sepsis secondary to nonobstructing nephrolithiasis/UTI/pyelonephritis/bacteremia  ?Secondary to Proteus species, POA  ?-CT scan remarkable for nonobstructive nephrolithiasis ?-Urology consulted, no current indication for procedure -likely would need further outpatient imaging versus cystoscopy in the near future if symptoms continue ?-Liberalize diet, hold off on IV fluids given heart failure history, encourage increased p.o. intake ?-Ceftriaxone ongoing -will likely need prolonged antibiotic course given pyelonephritis and bacteremia ? ?Acute hypoxic respiratory failure secondary to volume overload ?Combined systolic/diastolic heart failure, not in acute exacerbation  ?-Most recent EF 20 to 25% status post ICD placement ?-Hold IV fluids, continue to liberalize diet increase p.o. intake ?-Holding torsemide and spironolactone ?- Lasix x1 previously, discontinued due to elevated creatinine ? ?AKI on  chronic kidney disease stage IIIb  ?-Creatinine up due to previous diuretics- UOP improving over the past 48 hours ?-Baseline around 1.5  ?-Creatinine stabilizing, no indication for nephrology consult at this time given improving urine output ? ?A-fib on Eliquis  ?-Resume Eliquis, hematuria resolved ?-Of note patient is on phenobarbital which may decrease Eliquis efficacy, will need to follow-up with PCP and neurology to discuss transitioning off this medication if possible ? ?COPD, not in acute exacerbation  ?No acute exacerbation ? ?Hyperlipidemia continue on statins. ? 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 ? ?DVT prophylaxis: Heparin drip ?Code Status: Full ?Family Communication: None present ? ?Status is: Inpatient ? ?Dispo: The patient is from: Home ?             Anticipated d/c is to: Home ?             Anticipated d/c date is: 24 to 48 hours ?             Patient currently not medically stable for discharge ? ?Consultants:  ?Urology ? ?Procedures:  ?None ? ?Antimicrobials:  ?Ceftriaxone ? ?Subjective: ?No acute issues or events overnight, continues to have low-grade fevers but generally improving otherwise denies nausea vomiting diarrhea constipation headache fevers chills or chest pain ? ?Objective: ?Vitals:  ? 09/01/21 0258 09/01/21 0300 09/01/21 0400 09/01/21 0500  ?BP: (!) 108/58 (!) 108/58 (!) 105/55 121/72  ?Pulse: 81 83 83 83  ?Resp: 20 18 20  (!) 22  ?Temp:  98.1 ?F (36.7 ?C)    ?TempSrc:  Oral    ?SpO2: (!) 89% 90% (!) 89% 95%  ?Weight:      ?Height:      ? ? ?Intake/Output Summary (Last 24 hours) at 09/01/2021 0715 ?Last data filed at 09/01/2021 0600 ?  Gross per 24 hour  ?Intake 1416.19 ml  ?Output 2125 ml  ?Net -708.81 ml  ? ? ?Filed Weights  ? 08/29/21 0625  ?Weight: 107.2 kg  ? ? ?Examination: ? ?General:  Pleasantly resting in bed, No acute distress, somewhat diaphoretic ?Lungs: Diminished bilaterally without overt wheezes rhonchi or rales. ?Heart:  Regular rate and rhythm.  Without murmurs,  rubs, or gallops. ?Abdomen:  Soft, minimally tender to deep palpation generally and diffusely without guarding or rebound. ?Extremities: Without cyanosis, clubbing, edema, or obvious deformity. ?Vascular:  Dorsalis pedis and posterior tibial pulses palpable bilaterally. ?Skin:  Warm and dry, no erythema ? ?Data Reviewed: I have personally reviewed following labs and imaging studies ? ?CBC: ?Recent Labs  ?Lab 08/28/21 ?1884 08/28/21 ?2358 08/29/21 ?1660 08/30/21 ?6301 08/30/21 ?1259 08/31/21 ?6010 08/31/21 ?1201 09/01/21 ?0308  ?WBC 7.6  --  8.5 12.5* 10.2 9.8 9.0 11.3*  ?NEUTROABS 6.4  --  7.4  --   --   --   --   --   ?HGB 14.6   < > 13.9 13.0 13.2 12.0* 12.3* 12.5*  ?HCT 44.5   < > 42.4 39.9 40.0 37.0* 37.3* 38.3*  ?MCV 95.3  --  95.3 95.0 95.7 95.6 95.6 95.3  ?PLT 114*  --  97* 105* 101* 91* 85* 88*  ? < > = values in this interval not displayed.  ? ? ?Basic Metabolic Panel: ?Recent Labs  ?Lab 08/28/21 ?9323 08/28/21 ?2358 08/29/21 ?0102 08/29/21 ?5573 08/30/21 ?2202 08/31/21 ?5427 09/01/21 ?0308  ?NA 136 137  137 138 135 140 137 137  ?K 4.2 4.0  4.0 3.8 4.0 3.8 4.0 4.3  ?CL 105 104  --  104 106 102 102  ?CO2 23  --   --  22 27 27 26   ?GLUCOSE 154* 157*  --  154* 115* 136* 160*  ?BUN 29* 31*  --  29* 30* 39* 44*  ?CREATININE 2.52* 2.50*  --  2.43* 2.23* 3.18* 3.25*  ?CALCIUM 8.8*  --   --  8.6* 8.7* 8.4* 8.4*  ?PHOS  --   --   --   --  4.1  --   --   ? ? ?GFR: ?Estimated Creatinine Clearance: 22.2 mL/min (A) (by C-G formula based on SCr of 3.25 mg/dL (H)). ?Liver Function Tests: ?Recent Labs  ?Lab 08/28/21 ?0623 08/29/21 ?7628 08/30/21 ?0152  ?AST 21 21  --   ?ALT 20 18  --   ?ALKPHOS 62 55  --   ?BILITOT 0.7 0.7  --   ?PROT 7.5 6.9  --   ?ALBUMIN 3.6 3.2* 3.0*  ? ? ?No results for input(s): LIPASE, AMYLASE in the last 168 hours. ?No results for input(s): AMMONIA in the last 168 hours. ?Coagulation Profile: ?Recent Labs  ?Lab 08/28/21 ?2347  ?INR 1.5*  ? ? ?Cardiac Enzymes: ?No results for input(s): CKTOTAL,  CKMB, CKMBINDEX, TROPONINI in the last 168 hours. ?BNP (last 3 results) ?No results for input(s): PROBNP in the last 8760 hours. ?HbA1C: ?No results for input(s): HGBA1C in the last 72 hours. ?CBG: ?No results for input(s): GLUCAP in the last 168 hours. ?Lipid Profile: ?No results for input(s): CHOL, HDL, LDLCALC, TRIG, CHOLHDL, LDLDIRECT in the last 72 hours. ?Thyroid Function Tests: ?No results for input(s): TSH, T4TOTAL, FREET4, T3FREE, THYROIDAB in the last 72 hours. ?Anemia Panel: ?No results for input(s): VITAMINB12, FOLATE, FERRITIN, TIBC, IRON, RETICCTPCT in the last 72 hours. ?Sepsis Labs: ?Recent Labs  ?Lab 08/28/21 ?2347 08/29/21 ?0145  ?LATICACIDVEN 0.9  1.0  ? ? ? ?Recent Results (from the past 240 hour(s))  ?Urine Culture     Status: Abnormal  ? Collection Time: 08/23/21  3:06 PM  ? Specimen: Urine, Clean Catch  ?Result Value Ref Range Status  ? Specimen Description URINE, CLEAN CATCH  Final  ? Special Requests   Final  ?  NONE ?Performed at [facility], [place] [address], [place], [place] [address] ?  ? Culture 40,000 COLONIES/mL PROTEUS MIRABILIS (A)  Final  ? Report Status 08/27/2021 FINAL  Final  ? Organism ID, Bacteria PROTEUS MIRABILIS (A)  Final  ?    Susceptibility  ? Proteus mirabilis - MIC*  ?  AMPICILLIN <=2 SENSITIVE Sensitive   ?  CEFAZOLIN <=4 SENSITIVE Sensitive   ?  CEFEPIME <=0.12 SENSITIVE Sensitive   ?  CEFTRIAXONE <=0.25 SENSITIVE Sensitive   ?  CIPROFLOXACIN <=0.25 SENSITIVE Sensitive   ?  GENTAMICIN <=1 SENSITIVE Sensitive   ?  IMIPENEM 2 SENSITIVE Sensitive   ?  NITROFURANTOIN RESISTANT Resistant   ?  TRIMETH/SULFA <=20 SENSITIVE Sensitive   ?  AMPICILLIN/SULBACTAM <=2 SENSITIVE Sensitive   ?  PIP/TAZO <=4 SENSITIVE Sensitive   ?  * 40,000 COLONIES/mL PROTEUS MIRABILIS  ?Urine Culture     Status: None  ? Collection Time: 08/28/21 11:38 PM  ? Specimen: In/Out Cath Urine  ?Result Value Ref Range Status  ? Specimen Description IN/OUT CATH URINE  Final  ? Special Requests NONE   Final  ? Culture   Final  ?  NO GROWTH ?Performed at [facility], [place] [address], [place], [place] [address] ?  ? Report Status 08/30/2021 FINAL  Final  ?Blood Culture (routine x 2)     Status: None (Pre

## 2021-09-01 NOTE — Progress Notes (Signed)
PHARMACY - PHYSICIAN COMMUNICATION ?CRITICAL VALUE ALERT - BLOOD CULTURE IDENTIFICATION (BCID) ? ?Ricky Hayden is an 76 y.o. male who presented to [facility] on 08/28/2021 with a chief complaint of fever and chills ? ?Assessment:  On ceftriaxone for sepsis from a urinary source>  Proteus identified in urine and now blood culture ? ?Name of physician (or Provider) Contacted: Dr. Avon Gully notified ? ?Current antibiotics: Ceftriaxone 2g IV daily ? ?Changes to prescribed antibiotics recommended: none ?Patient is on recommended antibiotics - No changes needed ? ?Results for orders placed or performed during the hospital encounter of 08/28/21  ?Blood Culture ID Panel (Reflexed) (Collected: 08/28/2021 11:45 PM)  ?Result Value Ref Range  ? Enterococcus faecalis NOT DETECTED NOT DETECTED  ? Enterococcus Faecium NOT DETECTED NOT DETECTED  ? Listeria monocytogenes NOT DETECTED NOT DETECTED  ? Staphylococcus species NOT DETECTED NOT DETECTED  ? Staphylococcus aureus (BCID) NOT DETECTED NOT DETECTED  ? Staphylococcus epidermidis NOT DETECTED NOT DETECTED  ? Staphylococcus lugdunensis NOT DETECTED NOT DETECTED  ? Streptococcus species NOT DETECTED NOT DETECTED  ? Streptococcus agalactiae NOT DETECTED NOT DETECTED  ? Streptococcus pneumoniae NOT DETECTED NOT DETECTED  ? Streptococcus pyogenes NOT DETECTED NOT DETECTED  ? A.calcoaceticus-baumannii NOT DETECTED NOT DETECTED  ? Bacteroides fragilis NOT DETECTED NOT DETECTED  ? Enterobacterales DETECTED (A) NOT DETECTED  ? Enterobacter cloacae complex NOT DETECTED NOT DETECTED  ? Escherichia coli NOT DETECTED NOT DETECTED  ? Klebsiella aerogenes NOT DETECTED NOT DETECTED  ? Klebsiella oxytoca NOT DETECTED NOT DETECTED  ? Klebsiella pneumoniae NOT DETECTED NOT DETECTED  ? Proteus species DETECTED (A) NOT DETECTED  ? Salmonella species NOT DETECTED NOT DETECTED  ? Serratia marcescens NOT DETECTED NOT DETECTED  ? Haemophilus influenzae NOT DETECTED NOT DETECTED  ? Neisseria meningitidis  NOT DETECTED NOT DETECTED  ? Pseudomonas aeruginosa NOT DETECTED NOT DETECTED  ? Stenotrophomonas maltophilia NOT DETECTED NOT DETECTED  ? Candida albicans NOT DETECTED NOT DETECTED  ? Candida auris NOT DETECTED NOT DETECTED  ? Candida glabrata NOT DETECTED NOT DETECTED  ? Candida krusei NOT DETECTED NOT DETECTED  ? Candida parapsilosis NOT DETECTED NOT DETECTED  ? Candida tropicalis NOT DETECTED NOT DETECTED  ? Cryptococcus neoformans/gattii NOT DETECTED NOT DETECTED  ? CTX-M ESBL NOT DETECTED NOT DETECTED  ? Carbapenem resistance IMP NOT DETECTED NOT DETECTED  ? Carbapenem resistance KPC NOT DETECTED NOT DETECTED  ? Carbapenem resistance NDM NOT DETECTED NOT DETECTED  ? Carbapenem resist OXA 48 LIKE NOT DETECTED NOT DETECTED  ? Carbapenem resistance VIM NOT DETECTED NOT DETECTED  ? ? ?[name] ?09/01/2021  9:47 AM ? ?

## 2021-09-01 NOTE — Progress Notes (Signed)
ANTICOAGULATION CONSULT NOTE - Follow Up Consult ? ?Pharmacy Consult for heparin ?Indication: atrial fibrillation ? ?Labs: ?Recent Labs  ?  08/29/21 ?0652 08/29/21 ?1539 08/30/21 ?0152 08/30/21 ?1259 08/31/21 ?0313 08/31/21 ?1201 08/31/21 ?2137 09/01/21 ?0308  ?HGB 13.9  --  13.0   < > 12.0* 12.3*  --  12.5*  ?HCT 42.4  --  39.9   < > 37.0* 37.3*  --  38.3*  ?PLT 97*  --  105*   < > 91* 85*  --  88*  ?APTT  --    < > 57*   < > 60* 54* 44* 51*  ?HEPARINUNFRC  --    < > >1.10*   < > 0.88*  --  0.81* 0.69  ?CREATININE 2.43*  --  2.23*  --  3.18*  --   --  3.25*  ?TROPONINIHS 51*  --   --   --   --   --   --   --   ? < > = values in this interval not displayed.  ? ?Assessment: ?76yo male c/o worsening SOB, admitted for possible sepsis d/t Proteus UTI, also endorses hematuria >> to transition from Eliquis for Afib to heparin; last dose of Eliquis taken 4/27 1900. ? ?Heparin level and aptt not correlating at this time. aPTT 51 ,subtherapeutic despite rate increase earlier. ?Current heparin infusion rate: 2450 units/hr ?CBC stable, Hgb 12.5, PLT 88. No infusion issues or s/sx of bleeding per RN report. RN did start new IV and connected the infusion to new site.  ? ?Goal of Therapy:  ?aPTT 66-85 seconds ?Heparin level 0.3-0.7 ?Monitor platelets by anticoagulation protocol: Yes ?  ?Plan:  ?Increase heparin infusion to 2650 units/hr ?Check aPTT and heparin level in 8 hours ?Monitor aPTT, heparin level, and CBC daily ?Continue to monitor for s/sx of bleeding ? ?Georga Bora, PharmD ?Clinical Pharmacist ?09/01/2021 4:28 AM ?Please check AMION for all [place] numbers ? ? ?

## 2021-09-01 NOTE — Evaluation (Signed)
Occupational Therapy Evaluation ?Patient Details ?Name: Ricky Hayden ?MRN: [id number] ?DOB: 12-May-1945 ?Today's Date: 09/01/2021 ? ? ?History of Present Illness This 76 yo male from home presented to hospital on 4/28 with fever, chills and SOB. Admitted with sepsis secondary to nonobstructing nephrolithiasis/pyelonephritis and concurrent UTI, acute hypoxic respiratory failure secondary to volume overload, and AKI on CKD.  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  ? ?Clinical Impression ?  ?This 76 yo male admitted with above presents to acute OT with PLOF of being able to be alone by himself during the day and managing to toilet himself as well as heat up food, getting around at a RW level. He currently is total A-Max A for all basic ADLs due to lethargy. He will continue to benefit from acute OT and would benefit from AIR post acute care to get to point he can stay by himself again except at night.  ?   ? ?Recommendations for follow up therapy are one component of a multi-disciplinary discharge planning process, led by the attending physician.  Recommendations may be updated based on patient status, additional functional criteria and insurance authorization.  ? ?Follow Up Recommendations ? Acute inpatient rehab (3hours/day)  ?  ?Assistance Recommended at Discharge Frequent or constant Supervision/Assistance  ?Patient can return home with the following A lot of help with walking and/or transfers;A lot of help with bathing/dressing/bathroom;Assistance with cooking/housework;Assistance with feeding;Help with stairs or ramp for entrance;Assist for transportation;Direct supervision/assist for financial management;Direct supervision/assist for medications management ? ?  ?Functional Status Assessment ? Patient has had a recent decline in their functional status and demonstrates the ability to make significant improvements in function in a reasonable and predictable amount of  time.  ?Equipment Recommendations ? Other (comment) (TBD next venue of care)  ?  ?   ?Precautions / Restrictions Precautions ?Precautions: Fall ?Precaution Comments: watch 02 ?Restrictions ?Weight Bearing Restrictions: No  ? ?  ? ?Mobility Bed Mobility ?Overal bed mobility: Needs Assistance ?Bed Mobility: Rolling, Sidelying to Sit, Sit to Supine ?Rolling: Mod assist ?Sidelying to sit: HOB elevated, Mod assist ?  ?  ?  ?General bed mobility comments: Step by step cues for sequencing, assist to roll to left and elevate trunk to get to EOB ?  ? ?Transfers ?Overall transfer level: Needs assistance ?Equipment used: Rolling walker (2 wheels) ?Transfers: Sit to/from Stand, Bed to chair/wheelchair/BSC ?Sit to Stand: Min assist, From elevated surface ?Stand pivot transfers: Mod assist (had one episode where both knees almost buckled (he twitched)) ?  ?  ?  ?  ?  ?  ? ?  ?Balance Overall balance assessment: Needs assistance, History of Falls ?Sitting-balance support: Feet supported, No upper extremity supported ?Sitting balance-Leahy Scale: Fair ?  ?  ?Standing balance support: During functional activity, Bilateral upper extremity supported ?Standing balance-Leahy Scale: Poor ?  ?  ?  ?  ?  ?  ?  ?  ?  ?  ?  ?  ?   ? ?ADL either performed or assessed with clinical judgement  ? ?ADL   ?  ?  ?  ?  ?  ?  ?  ?  ?  ?  ?  ?  ?  ?  ?  ?  ?  ?  ?  ?General ADL Comments: total (really lethargic today)  ? ? ? ?Vision   ?Additional Comments: kept eyes closed 75% of session  ?   ?   ?   ? ?  Pertinent Vitals/Pain Pain Assessment ?Pain Assessment: No/denies pain  ? ? ? ?Hand Dominance Right ?  ?Extremity/Trunk Assessment Upper Extremity Assessment ?Upper Extremity Assessment: Generalized weakness ?  ? ?  ?Communication Communication ?Communication: No difficulties ?  ?Cognition Arousal/Alertness: Lethargic ?Behavior During Therapy: [facility] for tasks assessed/performed ?Overall Cognitive Status: No family/caregiver present to determine baseline  cognitive functioning ?Area of Impairment: Attention, Problem solving, Following commands, Orientation ?  ?  ?  ?  ?  ?  ?  ?  ?Orientation Level: Disoriented to, Time, Place, Situation ?Current Attention Level: Focused ?  ?Following Commands: Follows one step commands with increased time, Follows one step commands inconsistently ?  ?  ?Problem Solving: Slow processing, Difficulty sequencing, Requires verbal cues, Requires tactile cues, Decreased initiation ?  ?  ?  ?General Comments  Sats ranged from 91-93% on 4.5 liters of O2. ? ?  ?   ?   ? ? ?Home Living Family/patient expects to be discharged to:: Private residence ?Living Arrangements: Children (stay with him a night) ?Available Help at Discharge: Family;Available PRN/intermittently ?Type of Home: House ?Home Access: Stairs to enter ?Entrance Stairs-Number of Steps: 1 ?Entrance Stairs-Rails: None ?Home Layout: One level ?  ?  ?Bathroom Shower/Tub: Gaffer;Tub/shower unit ?  ?  ?  ?  ?Home Equipment: Rollator (4 wheels);Hospital bed;Shower seat ?  ?  ?  ? ?  ?Prior Functioning/Environment Prior Level of Function : Needs assist ?  ?  ?  ?  ?  ?  ?Mobility Comments: uses rollator for ambulation, children mostly manage IADLs ?ADLs Comments: Needs help sometimes per report. Drives sometimes. ?  ? ?  ?  ?OT Problem List: Decreased strength;Impaired balance (sitting and/or standing) ?  ?   ?OT Treatment/Interventions: Self-care/ADL training;Patient/family education;DME and/or AE instruction;Balance training  ?  ?OT Goals(Current goals can be found in the care plan section) Acute Rehab OT Goals ?Patient Stated Goal: to sleep ("I'm really tired") ?OT Goal Formulation: With patient ?Time For Goal Achievement: 09/15/21 ?Potential to Achieve Goals: Good  ?OT Frequency: Min 2X/week ?  ? ?   ?AM-PAC OT "6 Clicks" Daily Activity     ?Outcome Measure Help from another person eating meals?: Total ?Help from another person taking care of personal grooming?: Total ?Help  from another person toileting, which includes using toliet, bedpan, or urinal?: A Lot ?Help from another person bathing (including washing, rinsing, drying)?: Total ?Help from another person to put on and taking off regular upper body clothing?: Total ?Help from another person to put on and taking off regular lower body clothing?: Total ?6 Click Score: 7 ?  ?End of Session Equipment Utilized During Treatment: Gait belt;Rolling walker (2 wheels) ?Nurse Communication: Mobility status;Need for lift equipment ? ?Activity Tolerance: Patient limited by lethargy ?Patient left: in chair;with call bell/phone within reach;with chair alarm set ? ?OT Visit Diagnosis: Unsteadiness on feet (R26.81);Other abnormalities of gait and mobility (R26.89);Muscle weakness (generalized) (M62.81);Other symptoms and signs involving cognitive function  ?              ?Time: 7517-0017 ?OT Time Calculation (min): 20 min ?Charges:  OT General Charges ?$OT Visit: 1 Visit ?OT Evaluation ?$OT Eval Moderate Complexity: 1 Mod ? ?[place], OTR/L ?Acute Rehab Services ?Pager [phone] ?Office [phone] ? ? ? ?Almon Register ?09/01/2021, 12:34 PM ?

## 2021-09-02 ENCOUNTER — Inpatient Hospital Stay (HOSPITAL_COMMUNITY): Payer: No Typology Code available for payment source

## 2021-09-02 DIAGNOSIS — B964 Proteus (mirabilis) (morganii) as the cause of diseases classified elsewhere: Secondary | ICD-10-CM | POA: Diagnosis not present

## 2021-09-02 DIAGNOSIS — A419 Sepsis, unspecified organism: Secondary | ICD-10-CM | POA: Diagnosis not present

## 2021-09-02 DIAGNOSIS — N179 Acute kidney failure, unspecified: Secondary | ICD-10-CM | POA: Diagnosis not present

## 2021-09-02 DIAGNOSIS — J9601 Acute respiratory failure with hypoxia: Secondary | ICD-10-CM

## 2021-09-02 DIAGNOSIS — N400 Enlarged prostate without lower urinary tract symptoms: Secondary | ICD-10-CM | POA: Diagnosis not present

## 2021-09-02 DIAGNOSIS — G9341 Metabolic encephalopathy: Secondary | ICD-10-CM

## 2021-09-02 DIAGNOSIS — I5042 Chronic combined systolic (congestive) and diastolic (congestive) heart failure: Secondary | ICD-10-CM | POA: Diagnosis not present

## 2021-09-02 LAB — BLOOD GAS, ARTERIAL
Acid-Base Excess: 2 mmol/L (ref 0.0–2.0)
Acid-Base Excess: 1.6 mmol/L (ref 0.0–2.0)
Bicarbonate: 30 mmol/L — ABNORMAL HIGH (ref 20.0–28.0)
Bicarbonate: 28.9 mmol/L — ABNORMAL HIGH (ref 20.0–28.0)
Drawn by: 273531
O2 Saturation: 93.5 %
O2 Saturation: 98.8 %
Patient temperature: 37
Patient temperature: 37
pCO2 arterial: 61 mmHg — ABNORMAL HIGH (ref 32–48)
pCO2 arterial: 56 mmHg — ABNORMAL HIGH (ref 32–48)
pH, Arterial: 7.3 — ABNORMAL LOW (ref 7.35–7.45)
pH, Arterial: 7.32 — ABNORMAL LOW (ref 7.35–7.45)
pO2, Arterial: 61 mmHg — ABNORMAL LOW (ref 83–108)
pO2, Arterial: 88 mmHg (ref 83–108)

## 2021-09-02 LAB — CULTURE, BLOOD (ROUTINE X 2): Special Requests: ADEQUATE

## 2021-09-02 LAB — HEPATIC FUNCTION PANEL
ALT: 28 U/L (ref 0–44)
AST: 36 U/L (ref 15–41)
Albumin: 2.5 g/dL — ABNORMAL LOW (ref 3.5–5.0)
Alkaline Phosphatase: 116 U/L (ref 38–126)
Bilirubin, Direct: 0.2 mg/dL (ref 0.0–0.2)
Indirect Bilirubin: 0.3 mg/dL (ref 0.3–0.9)
Total Bilirubin: 0.5 mg/dL (ref 0.3–1.2)
Total Protein: 6.8 g/dL (ref 6.5–8.1)

## 2021-09-02 LAB — T4, FREE: Free T4: 1.15 ng/dL — ABNORMAL HIGH (ref 0.61–1.12)

## 2021-09-02 LAB — BASIC METABOLIC PANEL WITH GFR
Anion gap: 8 (ref 5–15)
BUN: 54 mg/dL — ABNORMAL HIGH (ref 8–23)
CO2: 27 mmol/L (ref 22–32)
Calcium: 8.8 mg/dL — ABNORMAL LOW (ref 8.9–10.3)
Chloride: 103 mmol/L (ref 98–111)
Creatinine, Ser: 3.26 mg/dL — ABNORMAL HIGH (ref 0.61–1.24)
GFR, Estimated: 19 mL/min — ABNORMAL LOW
Glucose, Bld: 127 mg/dL — ABNORMAL HIGH (ref 70–99)
Potassium: 4.6 mmol/L (ref 3.5–5.1)
Sodium: 138 mmol/L (ref 135–145)

## 2021-09-02 LAB — CBC
HCT: 37 % — ABNORMAL LOW (ref 39.0–52.0)
Hemoglobin: 12.5 g/dL — ABNORMAL LOW (ref 13.0–17.0)
MCH: 32 pg (ref 26.0–34.0)
MCHC: 33.8 g/dL (ref 30.0–36.0)
MCV: 94.6 fL (ref 80.0–100.0)
Platelets: 74 10*3/uL — ABNORMAL LOW (ref 150–400)
RBC: 3.91 MIL/uL — ABNORMAL LOW (ref 4.22–5.81)
RDW: 13.2 % (ref 11.5–15.5)
WBC: 8.7 10*3/uL (ref 4.0–10.5)
nRBC: 0 % (ref 0.0–0.2)

## 2021-09-02 LAB — MRSA NEXT GEN BY PCR, NASAL: MRSA by PCR Next Gen: NOT DETECTED

## 2021-09-02 LAB — HEMOGLOBIN A1C
Hgb A1c MFr Bld: 5.7 % — ABNORMAL HIGH (ref 4.8–5.6)
Mean Plasma Glucose: 116.89 mg/dL

## 2021-09-02 LAB — GLUCOSE, CAPILLARY
Glucose-Capillary: 114 mg/dL — ABNORMAL HIGH (ref 70–99)
Glucose-Capillary: 117 mg/dL — ABNORMAL HIGH (ref 70–99)
Glucose-Capillary: 123 mg/dL — ABNORMAL HIGH (ref 70–99)
Glucose-Capillary: 126 mg/dL — ABNORMAL HIGH (ref 70–99)

## 2021-09-02 LAB — BRAIN NATRIURETIC PEPTIDE: B Natriuretic Peptide: 137 pg/mL — ABNORMAL HIGH (ref 0.0–100.0)

## 2021-09-02 LAB — TSH: TSH: 1.439 u[IU]/mL (ref 0.350–4.500)

## 2021-09-02 LAB — LACTIC ACID, PLASMA: Lactic Acid, Venous: 0.9 mmol/L (ref 0.5–1.9)

## 2021-09-02 LAB — PHENOBARBITAL LEVEL: Phenobarbital: 19.1 ug/mL (ref 15.0–40.0)

## 2021-09-02 LAB — AMMONIA: Ammonia: 45 umol/L — ABNORMAL HIGH (ref 9–35)

## 2021-09-02 MED ORDER — CHLORHEXIDINE GLUCONATE 0.12 % MT SOLN
15.0000 mL | Freq: Two times a day (BID) | OROMUCOSAL | Status: DC
  Administered 2021-09-02 – 2021-09-03 (×3): 15 mL via OROMUCOSAL
  Filled 2021-09-02 (×2): qty 15

## 2021-09-02 MED ORDER — ORAL CARE MOUTH RINSE
15.0000 mL | Freq: Two times a day (BID) | OROMUCOSAL | Status: DC
  Administered 2021-09-03: 15 mL via OROMUCOSAL

## 2021-09-02 MED ORDER — ALBUTEROL SULFATE (2.5 MG/3ML) 0.083% IN NEBU: 2.5000 mg | INHALATION_SOLUTION | RESPIRATORY_TRACT | Status: DC | PRN

## 2021-09-02 MED ORDER — IPRATROPIUM-ALBUTEROL 0.5-2.5 (3) MG/3ML IN SOLN
3.0000 mL | Freq: Four times a day (QID) | RESPIRATORY_TRACT | Status: DC
  Administered 2021-09-02 – 2021-09-15 (×53): 3 mL via RESPIRATORY_TRACT
  Filled 2021-09-02 (×50): qty 3

## 2021-09-02 MED ORDER — FUROSEMIDE 10 MG/ML IJ SOLN
40.0000 mg | Freq: Once | INTRAMUSCULAR | Status: AC
  Administered 2021-09-02: 40 mg via INTRAVENOUS
  Filled 2021-09-02: qty 4

## 2021-09-02 MED ORDER — CEFAZOLIN SODIUM-DEXTROSE 2-4 GM/100ML-% IV SOLN
2.0000 g | Freq: Two times a day (BID) | INTRAVENOUS | Status: DC
  Administered 2021-09-02 (×2): 2 g via INTRAVENOUS
  Filled 2021-09-02 (×3): qty 100

## 2021-09-02 NOTE — Progress Notes (Signed)
Physical Therapy Treatment ?Patient Details ?Name: Ricky Hayden ?MRN: [id number] ?DOB: 1945/12/26 ?Today's Date: 09/02/2021 ? ? ?History of Present Illness This 76 yo male from home presented to hospital on 4/28 with fever, chills and SOB. Admitted with sepsis secondary to nonobstructing nephrolithiasis/pyelonephritis and concurrent UTI, acute hypoxic respiratory failure secondary to volume overload, and AKI on CKD.  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 ? ?  ?PT Comments  ? ? Pt lethargic and rapid response present. Attempted to incr pt arousal by sitting EOB. Pt aroused some and followed a few commands and spoke x 2 to questions but unable to maintain arousal. Pt also with asterixis of trunk. Will continue to follow.   ?Recommendations for follow up therapy are one component of a multi-disciplinary discharge planning process, led by the attending physician.  Recommendations may be updated based on patient status, additional functional criteria and insurance authorization. ? ?Follow Up Recommendations ? Acute inpatient rehab (3hours/day) ?  ?  ?Assistance Recommended at Discharge Frequent or constant Supervision/Assistance  ?Patient can return home with the following Assist for transportation;Help with stairs or ramp for entrance;Direct supervision/assist for medications management;Assistance with cooking/housework;Direct supervision/assist for financial management;Two people to help with walking and/or transfers;Two people to help with bathing/dressing/bathroom ?  ?Equipment Recommendations ? None recommended by PT  ?  ?Recommendations for Other Services   ? ? ?  ?Precautions / Restrictions Precautions ?Precautions: Fall ?Precaution Comments: watch 02 ?Restrictions ?Weight Bearing Restrictions: No  ?  ? ?Mobility ? Bed Mobility ?Overal bed mobility: Needs Assistance ?Bed Mobility: Rolling, Sit to Supine, Supine to Sit ?Rolling: +2 for physical assistance, Total  assist ?  ?Supine to sit: +2 for physical assistance, Total assist, HOB elevated ?Sit to supine: +2 for physical assistance, Total assist ?  ?General bed mobility comments: Assist for all aspects ?  ? ?Transfers ?  ?  ?  ?  ?  ?  ?  ?  ?  ?General transfer comment: Unable to attempt due to lethargy ?  ? ?Ambulation/Gait ?  ?  ?  ?  ?  ?  ?  ?  ? ? ?Stairs ?  ?  ?  ?  ?  ? ? ?Wheelchair Mobility ?  ? ?Modified Rankin (Stroke Patients Only) ?  ? ? ?  ?Balance Overall balance assessment: Needs assistance, History of Falls ?Sitting-balance support: Feet supported, Bilateral upper extremity supported ?Sitting balance-Leahy Scale: Poor ?Sitting balance - Comments: Sat EOB x 5 minutes with mod assist due to asterixis of trunk in sitting and lethargy ?  ?  ?  ?  ?  ?  ?  ?  ?  ?  ?  ?  ?  ?  ?  ?  ? ?  ?Cognition Arousal/Alertness: Lethargic ?Behavior During Therapy: Flat affect ?Overall Cognitive Status: Difficult to assess ?  ?  ?  ?  ?  ?  ?  ?  ?  ?  ?  ?  ?  ?  ?  ?  ?General Comments: Followed some 1 step commands when aroused ?  ?  ? ?  ?Exercises   ? ?  ?General Comments   ?  ?  ? ?Pertinent Vitals/Pain Pain Assessment ?Pain Assessment: Faces ?Faces Pain Scale: No hurt  ? ? ?Home Living   ?  ?  ?  ?  ?  ?  ?  ?  ?  ?   ?  ?Prior Function    ?  ?  ?   ? ?  PT Goals (current goals can now be found in the care plan section) Progress towards PT goals: Not progressing toward goals - comment (lethargy) ? ?  ?Frequency ? ? ? Min 3X/week ? ? ? ?  ?PT Plan Current plan remains appropriate (If lethargy improves)  ? ? ?Co-evaluation   ?  ?  ?  ?  ? ?  ?AM-PAC PT "6 Clicks" Mobility   ?Outcome Measure ? Help needed turning from your back to your side while in a flat bed without using bedrails?: Total ?Help needed moving from lying on your back to sitting on the side of a flat bed without using bedrails?: Total ?Help needed moving to and from a bed to a chair (including a wheelchair)?: Total ?Help needed standing up from a chair  using your arms (e.g., wheelchair or bedside chair)?: Total ?Help needed to walk in hospital room?: Total ?Help needed climbing 3-5 steps with a railing? : Total ?6 Click Score: 6 ? ?  ?End of Session   ?Activity Tolerance: Patient limited by lethargy ?Patient left: in bed;with call bell/phone within reach;with bed alarm set;with nursing/sitter in room (Rapid response nurse present) ?  ?PT Visit Diagnosis: Muscle weakness (generalized) (M62.81);Difficulty in walking, not elsewhere classified (R26.2);Unsteadiness on feet (R26.81) ?  ? ? ?Time: 1359-1410 ?PT Time Calculation (min) (ACUTE ONLY): 11 min ? ?Charges:  $Therapeutic Activity: 8-22 mins          ?          ? ?[facility] PT ?Acute Rehabilitation Services ?Office [phone] ? ? ? ?Shary Decamp [facility] ?09/02/2021, 2:33 PM ? ?

## 2021-09-02 NOTE — Consult Note (Signed)
?  [facility] for Infectious Disease  ? ? ? ? ? ?Reason for Consult: fever  ?Referring Physician: Dr. Avon Gully ? ?Principal Problem: ?  Sepsis ([place]) ?Active Problems: ?  Essential hypertension ?  BPH (benign prostatic hyperplasia) ?  COPD (chronic obstructive pulmonary disease) ([place]) ?  Chronic combined systolic and diastolic CHF, NYHA class 2 ([place]) ?  NICM (nonischemic cardiomyopathy) ([place]) ?  Presence of biventricular cardiac pacemaker ?  UTI (urinary tract infection) ?  ARF (acute renal failure) ([place]) ?  Proteus (mirabilis) ([place]) as the cause of diseases classified elsewhere ? ? ? amiodarone  200 mg Oral Daily  ? apixaban  5 mg Oral BID  ? carvedilol  6.25 mg Oral BID WC  ? Chlorhexidine Gluconate Cloth  6 each Topical Daily  ? finasteride  5 mg Oral Daily  ? hydrALAZINE  50 mg Oral Q8H  ? mometasone-formoterol  2 puff Inhalation BID  ? pantoprazole  40 mg Oral Daily  ? PHENobarbital  64.8 mg Oral BID  ? pravastatin  40 mg Oral QPM  ? ? ?Recommendations: ?Continue with cefazolin ?Cephalexin orally at discharge to complete 14 days ? Outpatient Urology follow up for further work up ? ?Assessment: ?He has Proteus in culture from a recent urine culture and now from the blood culture concerning for a urinary Proteus infection.  Stones are non-obstructing and he did have some hematuria but was noted in the setting of significant glucosuria, so unclear if related to stones or spillover from the glucose loss.  At this point, he should continue with cephalexin at discharge and treat for about 14 days total with close follow up with urology.  It is possible the renal stones are the issue.   ? ?Antibiotics: ?Ceftriaxone 5 days ?Day 1 cefazolin ? ?HPI: Ricky Hayden is a 76 y.o. male with a history of CHF with an ICD, atrial fibrillation, chronic kidney disease, COPD came in with hematuria and fever.  He was seen in urgent care on 4/22 and thought to have a UTI with a positive culture for Proteus and given  Bactrim but came to the ED on 4/28 with fever and chills.  One blood culture set positive now with Proteus again and he has been on ceftriaxone, and now changed to cefazolin.  Feeling better.  CT scan with several non-obstructing stones.   ? ?Review of Systems: ? Unable to be assessed due to patient factors ? ?Past Medical History:  ?Diagnosis Date  ? Arthritis   ? osteoarthritis of left knee  ? Ascending aorta dilatation (HCC)   ? 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 Cardiac arrhythmia   ? life threatening, secondary to CCB vs b- blockers  ? Cardiomyopathy ([place])   ? Chronic joint pain   ? Chronic systolic CHF (congestive heart failure) ([place])   ? CKD (chronic kidney disease), stage III ([place])   ? COPD (chronic obstructive pulmonary disease) ([place])   ? Coronary artery disease   ? Dilated aortic root ([place])   ? Enlarged prostate   ? 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 GERD (gastroesophageal reflux disease)   ? Gout   ? Hiatal hernia   ? Hyperlipidemia   ? Hypertension   ? Intracranial hematoma ([place]) 1995  ? history of, s/p evacuation by Dr. Sherwood Gambler  ? Nocturia   ? Obesity   ? Pansinusitis   ? a.  complicated by brain abscess and bleeding requiring craniotomy in 1995.  ? PONV (  postoperative nausea and vomiting)   ? Sinusitis   ? s/p ethmoidectomy and nasal septoplasty  ? Vertigo   ? intermitantly  ? ? ?Social History  ? ?Tobacco Use  ? Smoking status: Former  ?  Types: Cigarettes  ?  Quit date: 05/04/1986  ?  Years since quitting: 35.3  ? Smokeless tobacco: Never  ?Vaping Use  ? Vaping Use: Never used  ?Substance Use Topics  ? Alcohol use: No  ?  Alcohol/week: 0.0 standard drinks  ? Drug use: No  ? ? ?Family History  ?Problem Relation Age of Onset  ? Heart failure Mother 16  ? Heart attack Father 59  ? ? ?Allergies  ?Allergen Reactions  ? Calcium Channel Blockers Other (See Comments)  ?  Came to hospital in 1995-caused chest pain ?  ? ? ?Physical Exam: ?Constitutional: arousable on bipap but not  interactive ?Vitals:  ? 09/02/21 0819 09/02/21 1211  ?BP: 109/65 (!) 153/73  ?Pulse: 77 81  ?Resp: 20 20  ?Temp: 98.4 ?F (36.9 ?C) 98.1 ?F (36.7 ?C)  ?SpO2: 95% 93%  ? ?EYES: anicteric ?ENMT: + mask ?Respiratory: respiratory effort normal on bipap ?GI: obese ?Musculoskeletal: no edema ?Skin: + rash ? ?Lab Results  ?Component Value Date  ? WBC 8.7 09/02/2021  ? HGB 12.5 (L) 09/02/2021  ? HCT 37.0 (L) 09/02/2021  ? MCV 94.6 09/02/2021  ? PLT 74 (L) 09/02/2021  ?  ?Lab Results  ?Component Value Date  ? CREATININE 3.26 (H) 09/02/2021  ? BUN 54 (H) 09/02/2021  ? NA 138 09/02/2021  ? K 4.6 09/02/2021  ? CL 103 09/02/2021  ? CO2 27 09/02/2021  ?  ?Lab Results  ?Component Value Date  ? ALT 18 08/29/2021  ? AST 21 08/29/2021  ? ALKPHOS 55 08/29/2021  ?  ? ?Microbiology: ?Recent Results (from the past 240 hour(s))  ?Urine Culture     Status: None  ? Collection Time: 08/28/21 11:38 PM  ? Specimen: In/Out Cath Urine  ?Result Value Ref Range Status  ? Specimen Description IN/OUT CATH URINE  Final  ? Special Requests NONE  Final  ? Culture   Final  ?  NO GROWTH ?Performed at [facility], [place] [address] [place], [place] [address] ?  ? Report Status 08/30/2021 FINAL  Final  ?Blood Culture (routine x 2)     Status: Abnormal  ? Collection Time: 08/28/21 11:45 PM  ? Specimen: BLOOD LEFT FOREARM  ?Result Value Ref Range Status  ? Specimen Description BLOOD LEFT FOREARM  Final  ? Special Requests   Final  ?  BOTTLES DRAWN AEROBIC AND ANAEROBIC Blood Culture adequate volume  ? Culture  Setup Time   Final  ?  GRAM NEGATIVE RODS ?ANAEROBIC BOTTLE ONLY ?CRITICAL RESULT CALLED TO, READ BACK BY AND VERIFIED WITH: PHARM D J.FRIENS ON [id number] AT 0941 BY E.PARRISH ?Performed at [facility], [place] [address] [place], [place] [address] ?  ? Culture PROTEUS MIRABILIS (A)  Final  ? Report Status 09/02/2021 FINAL  Final  ? Organism ID, Bacteria PROTEUS MIRABILIS  Final  ?    Susceptibility  ? Proteus mirabilis - MIC*  ?  AMPICILLIN <=2  SENSITIVE Sensitive   ?  CEFAZOLIN <=4 SENSITIVE Sensitive   ?  CEFEPIME <=0.12 SENSITIVE Sensitive   ?  CEFTAZIDIME <=1 SENSITIVE Sensitive   ?  CEFTRIAXONE <=0.25 SENSITIVE Sensitive   ?  CIPROFLOXACIN <=0.25 SENSITIVE Sensitive   ?  GENTAMICIN <=1 SENSITIVE Sensitive   ?  IMIPENEM 2 SENSITIVE Sensitive   ?  TRIMETH/SULFA <=20 SENSITIVE Sensitive   ?  AMPICILLIN/SULBACTAM <=2 SENSITIVE Sensitive   ?  PIP/TAZO <=4 SENSITIVE Sensitive   ?  * PROTEUS MIRABILIS  ?Blood Culture ID Panel (Reflexed)     Status: Abnormal  ? Collection Time: 08/28/21 11:45 PM  ?Result Value Ref Range Status  ? Enterococcus faecalis NOT DETECTED NOT DETECTED Final  ? Enterococcus Faecium NOT DETECTED NOT DETECTED Final  ? Listeria monocytogenes NOT DETECTED NOT DETECTED Final  ? Staphylococcus species NOT DETECTED NOT DETECTED Final  ? Staphylococcus aureus (BCID) NOT DETECTED NOT DETECTED Final  ? Staphylococcus epidermidis NOT DETECTED NOT DETECTED Final  ? Staphylococcus lugdunensis NOT DETECTED NOT DETECTED Final  ? Streptococcus species NOT DETECTED NOT DETECTED Final  ? Streptococcus agalactiae NOT DETECTED NOT DETECTED Final  ? Streptococcus pneumoniae NOT DETECTED NOT DETECTED Final  ? Streptococcus pyogenes NOT DETECTED NOT DETECTED Final  ? A.calcoaceticus-baumannii NOT DETECTED NOT DETECTED Final  ? Bacteroides fragilis NOT DETECTED NOT DETECTED Final  ? Enterobacterales DETECTED (A) NOT DETECTED Final  ?  Comment: Enterobacterales represent a large order of gram negative bacteria, not a single organism. ?CRITICAL RESULT CALLED TO, READ BACK BY AND VERIFIED WITH: ?PHARM D J.FRIENS ON [id number] AT 0941 BY E.PARRISH ?  ? Enterobacter cloacae complex NOT DETECTED NOT DETECTED Final  ? Escherichia coli NOT DETECTED NOT DETECTED Final  ? Klebsiella aerogenes NOT DETECTED NOT DETECTED Final  ? Klebsiella oxytoca NOT DETECTED NOT DETECTED Final  ? Klebsiella pneumoniae NOT DETECTED NOT DETECTED Final  ? Proteus species DETECTED (A) NOT  DETECTED Final  ?  Comment: CRITICAL RESULT CALLED TO, READ BACK BY AND VERIFIED WITH: ?PHARM D J.FRIENS ON [id number] AT 0941 BY E.PARRISH ?  ? Salmonella species NOT DETECTED NOT DETECTED Final  ? Serratia marcescens NO

## 2021-09-02 NOTE — Significant Event (Signed)
Rapid Response Event Note  ? ?Reason for Call :  ?Decreased LOC in increased O2 needs. ? ?Initial Focused Assessment:  ?Patient is lying in the bed asleep.  He is difficult to arouse. He will open eyes and say "yeah" then falls back asleep.   ?Decreased lung sounds and snoring (obstructive sounding) respirations. He does have a gag, but does not reach up to remove tooth brush.   ?Heart rate: V paced rhythm ?Some asterixis ? ?BP 134/69   HR 83  RR 22  O2 sat 98% on 10L [place]  ?Per RN Recent rectal temp  ?CBG 126 ? ? ?Interventions:  ?Wean O2 to 2L [place] O2 sat 91-93% ?Repositioned and PT at bedside assisted him to sitting on the side of the bed.  Then placed the bed in Chair position. ? ?ABG drawn ?7.3/61/61/30 ? ?RT placed patient on BIpap ? ?Plan of Care:  ? ? ? ?Event Summary:  ? ?MD Notified: Dr Avon Gully ?Call Time: 1312 ?Arrival Time: 1499 ?End Time: 6924 ? ?Raliegh Ip, RN ?

## 2021-09-02 NOTE — Progress Notes (Signed)
eLink Physician-Brief Progress Note ?Patient Name: Ricky Hayden ?DOB: [birth year] ?MRN: [id number] ? ? ?Date of Service ? 09/02/2021  ?HPI/Events of Note ? Patient originally admitted for sepsis secondary to a Proteus UTI, and transferred to the ICU for altered mental status, acute hypoxemic / hypercapnic respiratory failure, and acute kidney injury, PMH significant for COPD, cardiomyopathy, and brain surgery. Patient is currently on BIPAP.  ?eICU Interventions ? New Patient Evaluation.  ? ? ? ?  ? ?Kerry Kass Darnette Lampron ?09/02/2021, 8:26 PM ?

## 2021-09-02 NOTE — Progress Notes (Signed)
Inpatient Rehab Admissions Coordinator:  ? ?I spoke with Pt's daughter regarding potential CIR admit. She can provide 24/7 min A and is interested in CIR for Pt. At this time, Pt. Total A and lethargic and It's not clear he can tolerate intensity of  CIR. However, this is a significant decline since yesterday, so I will follow for 1-2 more days and re-assess candidacy on an ongoing basis.  ? ?Clemens Catholic, MS, CCC-SLP ?Rehab Admissions Coordinator  ?[phone] (celll) ?[phone] (office) ? ?

## 2021-09-02 NOTE — Progress Notes (Addendum)
?PROGRESS NOTE ? ? ? ?Ricky Hayden  CHY:[id number] DOB: [birth year] DOA: 08/28/2021 ?PCP: [facility] ? ? ?Brief Narrative:  ?Ricky Hayden is a 76 y.o. 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  Had gone to the urgent care about a week ago and was prescribed Bactrim - urine cultures showed Proteus mirabilis at that time. Last evening at home patient started having fever and chills and shortness of breath. Was brought to the ER. ?  ?Assessment & Plan: ?  ?Principal Problem: ?  Sepsis ([place]) ?Active Problems: ?  Essential hypertension ?  BPH (benign prostatic hyperplasia) ?  COPD (chronic obstructive pulmonary disease) ([place]) ?  Chronic combined systolic and diastolic CHF, NYHA class 2 ([place]) ?  NICM (nonischemic cardiomyopathy) ([place]) ?  Presence of biventricular cardiac pacemaker ?  UTI (urinary tract infection) ?  ARF (acute renal failure) ([place]) ? ?Sepsis secondary to nonobstructing nephrolithiasis, UTI, pyelonephritis, bacteremia  ?Secondary to Proteus species, POA  ?-CT scan remarkable for nonobstructive nephrolithiasis ?-Urology consulted, no current indication for procedure -likely would need further outpatient imaging versus cystoscopy in the near future if symptoms continue ?-Liberalize diet, hold off on IV fluids given heart failure history, encourage increased p.o. intake ?-ID consulted given continued fevers despite appropriate antibiotics, appreciate insight and recommendations -continues to have fevers despite appropriate antibiotics on current sensitivities, can de-escalate to cefazolin. ? ?Acute hypoxic respiratory failure secondary to volume overload ?Combined systolic/diastolic heart failure, questionably in acute exacerbation  ?-Most recent EF 20 to 25% status post ICD placement ?-Hold IV fluids, continue liberalized diet ?-Holding  torsemide and spironolactone ?- Lasix x1 PRN - 40 IV x1 today for volume management - use sparingly given AKI as below (although stabilizing today) ?**Afternoon update, patient having worsening respiratory status this afternoon, Lasix previously ordered now given, ABG shows mild hypercarbia, BiPAP placed per RT.  We will continue to wean BiPAP as tolerated, likely still volume overloaded with elevated creatinine making diuresis difficult.  Hopefully PEEP will help improve VQ mismatch in the setting of pulmonary edema. ? ?AKI on chronic kidney disease stage IIIb  ?-Creatinine up due to previous diuretics- UOP improving over the past 48 hours ?-Baseline around 1.5  ?-Creatinine stabilizing, no indication for nephrology consult at this time given improving urine output ? ?A-fib on Eliquis  ?-Resume Eliquis, hematuria resolved ?-Of note patient is on phenobarbital which may decrease Eliquis efficacy, will need to follow-up with PCP/neurology to discuss transitioning off this medication if possible ? ?COPD, not in acute exacerbation  ?No acute exacerbation ? ?Hyperlipidemia continue on statins. ? 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 ? ?DVT prophylaxis: Eliquis ?Code Status: Full ?Family Communication: None present ? ?Status is: Inpatient ? ?Dispo: The patient is from: Home ?             Anticipated d/c is to: Home ?             Anticipated d/c date is: 24 to 48 hours ?             Patient currently not medically stable for discharge ? ?Consultants:  ?Urology, ID ? ?Procedures:  ?None ? ?Antimicrobials:  ?Ceftriaxone ? ?Subjective: ?No acute issues or events overnight, continues to have low-grade fevers but generally improving otherwise denies nausea vomiting diarrhea constipation headache fevers chills or chest pain ? ?Objective: ?Vitals:  ? 09/01/21 2000 09/01/21  2025 09/01/21 2228 09/02/21 0425  ?BP: 134/60  (!) 106/59 106/65  ?Pulse: 91 96 80 74  ?Resp: (!) 23 20 (!) 21 20  ?Temp: 98.7 ?F (37.1 ?C) (!)  101.7 ?F (38.7 ?C) 98.4 ?F (36.9 ?C) (!) 97.5 ?F (36.4 ?C)  ?TempSrc: Oral  Oral Axillary  ?SpO2: 94% 94% 95% 93%  ?Weight:      ?Height:      ? ? ?Intake/Output Summary (Last 24 hours) at 09/02/2021 0744 ?Last data filed at 09/02/2021 9622 ?Gross per 24 hour  ?Intake 340 ml  ?Output 1400 ml  ?Net -1060 ml  ? ? ?Filed Weights  ? 08/29/21 0625  ?Weight: 107.2 kg  ? ? ?Examination: ? ?General:  Pleasantly resting in bed, No acute distress, somewhat diaphoretic ?Lungs: Diminished bilaterally without overt wheezes rhonchi or rales. ?Heart:  Regular rate and rhythm.  Without murmurs, rubs, or gallops. ?Abdomen:  Soft, minimally tender to deep palpation generally and diffusely without guarding or rebound. ?Extremities: Without cyanosis, clubbing, edema, or obvious deformity. ?Vascular:  Dorsalis pedis and posterior tibial pulses palpable bilaterally. ?Skin:  Warm and dry, no erythema ? ?Data Reviewed: I have personally reviewed following labs and imaging studies ? ?CBC: ?Recent Labs  ?Lab 08/28/21 ?2979 08/28/21 ?2358 08/29/21 ?8921 08/30/21 ?1941 08/30/21 ?1259 08/31/21 ?7408 08/31/21 ?1201 09/01/21 ?0308 09/02/21 ?1448  ?WBC 7.6  --  8.5   < > 10.2 9.8 9.0 11.3* 8.7  ?NEUTROABS 6.4  --  7.4  --   --   --   --   --   --   ?HGB 14.6   < > 13.9   < > 13.2 12.0* 12.3* 12.5* 12.5*  ?HCT 44.5   < > 42.4   < > 40.0 37.0* 37.3* 38.3* 37.0*  ?MCV 95.3  --  95.3   < > 95.7 95.6 95.6 95.3 94.6  ?PLT 114*  --  97*   < > 101* 91* 85* 88* 74*  ? < > = values in this interval not displayed.  ? ? ?Basic Metabolic Panel: ?Recent Labs  ?Lab 08/29/21 ?1856 08/30/21 ?3149 08/31/21 ?7026 09/01/21 ?0308 09/02/21 ?3785  ?NA 135 140 137 137 138  ?K 4.0 3.8 4.0 4.3 4.6  ?CL 104 106 102 102 103  ?CO2 22 27 27 26 27   ?GLUCOSE 154* 115* 136* 160* 127*  ?BUN 29* 30* 39* 44* 54*  ?CREATININE 2.43* 2.23* 3.18* 3.25* 3.26*  ?CALCIUM 8.6* 8.7* 8.4* 8.4* 8.8*  ?PHOS  --  4.1  --   --   --   ? ? ?GFR: ?Estimated Creatinine Clearance: 22.1 mL/min (A) (by C-G  formula based on SCr of 3.26 mg/dL (H)). ?Liver Function Tests: ?Recent Labs  ?Lab 08/28/21 ?8850 08/29/21 ?2774 08/30/21 ?0152  ?AST 21 21  --   ?ALT 20 18  --   ?ALKPHOS 62 55  --   ?BILITOT 0.7 0.7  --   ?PROT 7.5 6.9  --   ?ALBUMIN 3.6 3.2* 3.0*  ? ? ?No results for input(s): LIPASE, AMYLASE in the last 168 hours. ?No results for input(s): AMMONIA in the last 168 hours. ?Coagulation Profile: ?Recent Labs  ?Lab 08/28/21 ?2347  ?INR 1.5*  ? ? ?Cardiac Enzymes: ?No results for input(s): CKTOTAL, CKMB, CKMBINDEX, TROPONINI in the last 168 hours. ?BNP (last 3 results) ?No results for input(s): PROBNP in the last 8760 hours. ?HbA1C: ?No results for input(s): HGBA1C in the last 72 hours. ?CBG: ?No results for input(s): GLUCAP in the last  168 hours. ?Lipid Profile: ?No results for input(s): CHOL, HDL, LDLCALC, TRIG, CHOLHDL, LDLDIRECT in the last 72 hours. ?Thyroid Function Tests: ?No results for input(s): TSH, T4TOTAL, FREET4, T3FREE, THYROIDAB in the last 72 hours. ?Anemia Panel: ?No results for input(s): VITAMINB12, FOLATE, FERRITIN, TIBC, IRON, RETICCTPCT in the last 72 hours. ?Sepsis Labs: ?Recent Labs  ?Lab 08/28/21 ?2347 08/29/21 ?0145  ?LATICACIDVEN 0.9 1.0  ? ? ? ?Recent Results (from the past 240 hour(s))  ?Urine Culture     Status: Abnormal  ? Collection Time: 08/23/21  3:06 PM  ? Specimen: Urine, Clean Catch  ?Result Value Ref Range Status  ? Specimen Description URINE, CLEAN CATCH  Final  ? Special Requests   Final  ?  NONE ?Performed at [facility], [place] [address], [place], [place] [address] ?  ? Culture 40,000 COLONIES/mL PROTEUS MIRABILIS (A)  Final  ? Report Status 08/27/2021 FINAL  Final  ? Organism ID, Bacteria PROTEUS MIRABILIS (A)  Final  ?    Susceptibility  ? Proteus mirabilis - MIC*  ?  AMPICILLIN <=2 SENSITIVE Sensitive   ?  CEFAZOLIN <=4 SENSITIVE Sensitive   ?  CEFEPIME <=0.12 SENSITIVE Sensitive   ?  CEFTRIAXONE <=0.25 SENSITIVE Sensitive   ?  CIPROFLOXACIN <=0.25 SENSITIVE Sensitive    ?  GENTAMICIN <=1 SENSITIVE Sensitive   ?  IMIPENEM 2 SENSITIVE Sensitive   ?  NITROFURANTOIN RESISTANT Resistant   ?  TRIMETH/SULFA <=20 SENSITIVE Sensitive   ?  AMPICILLIN/SULBACTAM <=2 SENSITIVE Sens

## 2021-09-02 NOTE — Plan of Care (Signed)
  Problem: Education: Goal: Knowledge of General Education information will improve Description: Including pain rating scale, medication(s)/side effects and non-pharmacologic comfort measures Outcome: Progressing   Problem: Health Behavior/Discharge Planning: Goal: Ability to manage health-related needs will improve Outcome: Progressing   Problem: Clinical Measurements: Goal: Will remain free from infection Outcome: Progressing   

## 2021-09-02 NOTE — Consult Note (Signed)
? ?NAME:  Ricky Hayden, MRN:  [id number], DOB:  December 08, 1945, LOS: 4 ?ADMISSION DATE:  08/28/2021, CONSULTATION DATE:  5/2 ?REFERRING MD:  Dr. Avon Gully, CHIEF COMPLAINT:  Acute respiratory failure  ? ?History of Present Illness:  ?History obtained from chart review and daughter [name] at bedside. ? ?Mr. Hudspeth is a 76 yo gentleman w/ pertinent PMH of chronic HFrEF with ICD, a-fib, CKD stage IIIb, COPD who presented to [facility] on 5/2 w/ hematuria x 3 weeks. The week prior to admission he was started on Bactrim for urine cultures showing proteus mirabilis. He began having worsening fever and SOB and was brought to [facility] ED on 4/27.  ? ?At admission he was initially started on BiPAP for SOB. Fever 103 F. Started on empiric abx. CT abdomen showing nonobstructive nephrolithiasis. Urology consulted with no indication for procedure.  Further work up showing sepsis from UTI w/ pyelonephritis. Blood cultures were positive for proteus mirabilis. On Ancef with ID managing antibiotics.  ? ?On 5/2 he began having decreased LOC and increased O2 requirements. Per his RN this began around 11AM and they waited for 2 ABG results before he was placed on BiPAP. Once he was on Bipap he had improvement in saturations. There was concern this was due to volume overload but concerning to give lasix with worsening kidney function.  Abg 7.3/ 61/ 61/ 30. PCCM consulted for hypoxia evaluation and management. ? ?Pertinent  Medical History  ? 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 ? ?Significant Hospital Events: ?Including procedures, antibiotic start and stop dates in addition to other pertinent events   ?4/27: admitted to [facility] for sepsis 2/2 UTI, started ceftriaxone and azithro ?4/27-5/1 on ceftriaxone ?5/2:  antibiotics deescalated to cefazolin. PCCM consult for hypoxia. ? ?Interim History /  Subjective:  ? ? ?Objective   ?Blood pressure 124/71, pulse 84, temperature 98.5 ?F (36.9 ?C), temperature source Oral, resp. rate 20, height 5\' 7"  (1.702 m), weight 107.2 kg, SpO2 99 %. ?   ?   ? ?Intake/Output Summary (Last 24 hours) at 09/02/2021 1748 ?Last data filed at 09/02/2021 1726 ?Gross per 24 hour  ?Intake 440 ml  ?Output 1550 ml  ?Net -1110 ml  ? ?Filed Weights  ? 08/29/21 0625  ?Weight: 107.2 kg  ? ? ?Examination: ?General: ill appearing man lying in bed in NAD, somnolent ?HENT: [place]/AT, eyes anicteric ?Lungs: CTAB, able to titrate down FiO2 to 30% with saturations remaining 96%. Vt 350-500cc on bipap.  ?Cardiovascular: S1S2, RRR ?Abdomen: obese, soft, NT ?Extremities: minimal peripheral edema, no cyanosis ?Neuro: barely arousal with sternal rub, responded slightly more to his daughter, maximal eye opening about 3 seconds and briefly tracked to his daughter, not following commands ?Derm: warm, dry, no diffuse rashes. ? ?7.3/61/61/30 ? ?BUN 54 ?Cr 3.26 ?H/H 12.5/37 ?Platelets 74 ? ?CXR personally reviewed> silhouetted left hemidiaphragm, mild pulm edema ? ?Resolved Hospital Problem list   ? ? ?Assessment & Plan:  ? ?Acute metabolic encephalopathy- unclear if this is fully explained by hypercapnia. Sepsis has been resolving so seems less likely. Sleep deprivation or delirium are possible. Accumulation of phenobarbital is possible. No known history of seizures, but tramadol could lower seizure threshold.  ?-check repeat ABG, ammonia, TSH, free T4, repeat blood cultures ?-STAT accucheck ?-CT head ?-remains at risk of intubation if not rapidly improving over the next few hours ? ?Hx of  brain surgery, on seizure prophylaxis ?-checking phenobarb level; ok to con't for now ?-if workup unrevealing, may need to consider EEG ? ?Acute respiratory failure with hypoxia and hypercapnia- suspect his encephalopathy may have contributed to hypoventilation. Has tolerated rapid weaning of FiO2. ?Chronic severe COPD- does not  appear acutely exacerbated ?-BiPAP; FiO2 titrated down to 30% with maintenance of saturations ?-Moving to ICU, remains high risk for intubation given severity of encephalopathy. ?-repeat ABG ?-con't bronchodilators; will transition to nebs until mentation improves ? ?Chronic HFrEF: LVEF 40-45% ?-check BNP ?-hold lasix for now ?-con't coreg & hydralazine ?-holding PTA jardiance, Entresto  ? ?AKI on chronic kidney disease stage IIIb ?-renally dose meds, avoid nephrotoxic meds ?-strict I/Os ?-monitor ?-hold Entresto ? ?Sepsis due to Proteus pyelonephritis and bacteremia ?-appreciate ID's recommendations ? ?A-fib on Eliquis  ?-con't eliquis and amiodarone ? ?Acute anemia likely due to acute illness ?-monitor ?-transfuse for Hb<7 or hemodynamically significant bleeding ? ?Acute thrombocytopenia ?-monitor ? ?HLD ?-pravastatin ? ?Daughter [name] updated at bedside. Full scope of care. He is independent at home during the day at baseline. ? ? ?Best Practice (right click and "Reselect all SmartList Selections" daily)  ? ?Diet/type: NPO w/ oral meds ?DVT prophylaxis: DOAC ?GI prophylaxis: PPI ?Lines: N/A ?Foley:  Yes, and it is still needed ?Code Status:  full code ?Last date of multidisciplinary goals of care discussion [5/2- daughter [name], full scope] ? ? ?Labs   ?CBC: ?Recent Labs  ?Lab 08/28/21 ?4782 08/28/21 ?2358 08/29/21 ?9562 08/30/21 ?1308 08/30/21 ?1259 08/31/21 ?6578 08/31/21 ?1201 09/01/21 ?0308 09/02/21 ?4696  ?WBC 7.6  --  8.5   < > 10.2 9.8 9.0 11.3* 8.7  ?NEUTROABS 6.4  --  7.4  --   --   --   --   --   --   ?HGB 14.6   < > 13.9   < > 13.2 12.0* 12.3* 12.5* 12.5*  ?HCT 44.5   < > 42.4   < > 40.0 37.0* 37.3* 38.3* 37.0*  ?MCV 95.3  --  95.3   < > 95.7 95.6 95.6 95.3 94.6  ?PLT 114*  --  97*   < > 101* 91* 85* 88* 74*  ? < > = values in this interval not displayed.  ? ? ?Basic Metabolic Panel: ?Recent Labs  ?Lab 08/29/21 ?2952 08/30/21 ?8413 08/31/21 ?2440 09/01/21 ?0308 09/02/21 ?1027  ?NA 135 140 137 137 138   ?K 4.0 3.8 4.0 4.3 4.6  ?CL 104 106 102 102 103  ?CO2 22 27 27 26 27   ?GLUCOSE 154* 115* 136* 160* 127*  ?BUN 29* 30* 39* 44* 54*  ?CREATININE 2.43* 2.23* 3.18* 3.25* 3.26*  ?CALCIUM 8.6* 8.7* 8.4* 8.4* 8.8*  ?PHOS  --  4.1  --   --   --   ? ?GFR: ?Estimated Creatinine Clearance: 22.1 mL/min (A) (by C-G formula based on SCr of 3.26 mg/dL (H)). ?Recent Labs  ?Lab 08/28/21 ?2536 08/29/21 ?0145 08/29/21 ?6440 08/31/21 ?3474 08/31/21 ?1201 09/01/21 ?0308 09/02/21 ?2595  ?WBC 7.6  --    < > 9.8 9.0 11.3* 8.7  ?LATICACIDVEN 0.9 1.0  --   --   --   --   --   ? < > = values in this interval not displayed.  ? ? ?Liver Function Tests: ?Recent Labs  ?Lab 08/28/21 ?6387 08/29/21 ?5643 08/30/21 ?0152  ?AST 21 21  --   ?ALT 20 18  --   ?ALKPHOS 62 55  --   ?BILITOT 0.7 0.7  --   ?  PROT 7.5 6.9  --   ?ALBUMIN 3.6 3.2* 3.0*  ? ?No results for input(s): LIPASE, AMYLASE in the last 168 hours. ?No results for input(s): AMMONIA in the last 168 hours. ? ?ABG ?   ?Component Value Date/Time  ? PHART 7.3 (L) 09/02/2021 1455  ? PCO2ART 61 (H) 09/02/2021 1455  ? PO2ART 61 (L) 09/02/2021 1455  ? HCO3 30.0 (H) 09/02/2021 1455  ? TCO2 29 08/29/2021 0102  ? O2SAT 93.5 09/02/2021 1455  ?  ? ?Coagulation Profile: ?Recent Labs  ?Lab 08/28/21 ?2347  ?INR 1.5*  ? ? ?Cardiac Enzymes: ?No results for input(s): CKTOTAL, CKMB, CKMBINDEX, TROPONINI in the last 168 hours. ? ?HbA1C: ?Hemoglobin A1C  ?Date/Time Value Ref Range Status  ?10/05/2012 03:13 PM 5.4  Final  ? ?Hgb A1c MFr Bld  ?Date/Time Value Ref Range Status  ?02/06/2008 11:25 PM   Final  ? 5.4 ?(NOTE)   The ADA recommends the following therapeutic goal for glycemic   control related to Hgb A1C measurement:   Goal of Therapy:   < 7.0% Hgb A1C   Reference: American Diabetes Association: Clinical Practice   Recommendations 2008, Diabetes Care,  ?2008, 31:(Suppl 1).  ? ? ?CBG: ?Recent Labs  ?Lab 09/02/21 ?1427  ?GLUCAP 126*  ? ? ?Review of Systems:   ?ROS unable to be obtained due to  encephalopathy. ? ?Past Medical History:  ?He,  has a past medical history of Arthritis, Ascending aorta dilatation ([place]), Balanitis, Cardiac arrhythmia, Cardiomyopathy ([place]), Chronic joint pain, Chronic systolic CHF ([place]est

## 2021-09-02 NOTE — Progress Notes (Addendum)
Inpatient Rehab Admissions Coordinator:  ? ? I do not yet have a bed for this Pt. On CIR today. Will continue follow for potential admit pending insurance auth and medical readiness. ? ?Clemens Catholic, MS, CCC-SLP ?Rehab Admissions Coordinator  ?[phone] (celll) ?[phone] (office) ? ?

## 2021-09-02 NOTE — Plan of Care (Signed)
  Problem: Clinical Measurements: Goal: Ability to maintain clinical measurements within normal limits will improve Outcome: Progressing   

## 2021-09-03 ENCOUNTER — Inpatient Hospital Stay (HOSPITAL_COMMUNITY): Payer: No Typology Code available for payment source

## 2021-09-03 DIAGNOSIS — A419 Sepsis, unspecified organism: Secondary | ICD-10-CM | POA: Diagnosis present

## 2021-09-03 DIAGNOSIS — J9601 Acute respiratory failure with hypoxia: Secondary | ICD-10-CM | POA: Diagnosis not present

## 2021-09-03 DIAGNOSIS — R579 Shock, unspecified: Secondary | ICD-10-CM

## 2021-09-03 DIAGNOSIS — B964 Proteus (mirabilis) (morganii) as the cause of diseases classified elsewhere: Secondary | ICD-10-CM | POA: Diagnosis not present

## 2021-09-03 DIAGNOSIS — R6521 Severe sepsis with septic shock: Secondary | ICD-10-CM

## 2021-09-03 LAB — CBC WITH DIFFERENTIAL/PLATELET
Abs Immature Granulocytes: 0 10*3/uL (ref 0.00–0.07)
Basophils Absolute: 0 10*3/uL (ref 0.0–0.1)
Basophils Relative: 0 %
Eosinophils Absolute: 0 10*3/uL (ref 0.0–0.5)
Eosinophils Relative: 0 %
HCT: 41.9 % (ref 39.0–52.0)
Hemoglobin: 13.3 g/dL (ref 13.0–17.0)
Lymphocytes Relative: 15 %
Lymphs Abs: 2.7 10*3/uL (ref 0.7–4.0)
MCH: 30.8 pg (ref 26.0–34.0)
MCHC: 31.7 g/dL (ref 30.0–36.0)
MCV: 97 fL (ref 80.0–100.0)
Monocytes Absolute: 2.1 10*3/uL — ABNORMAL HIGH (ref 0.1–1.0)
Monocytes Relative: 12 %
Neutro Abs: 12.9 10*3/uL — ABNORMAL HIGH (ref 1.7–7.7)
Neutrophils Relative %: 73 %
Platelets: 109 10*3/uL — ABNORMAL LOW (ref 150–400)
RBC: 4.32 MIL/uL (ref 4.22–5.81)
RDW: 13.3 % (ref 11.5–15.5)
Smear Review: DECREASED
WBC: 17.7 10*3/uL — ABNORMAL HIGH (ref 4.0–10.5)
nRBC: 0.2 % (ref 0.0–0.2)

## 2021-09-03 LAB — CBC
HCT: 38.9 % — ABNORMAL LOW (ref 39.0–52.0)
Hemoglobin: 12.9 g/dL — ABNORMAL LOW (ref 13.0–17.0)
MCH: 31.5 pg (ref 26.0–34.0)
MCHC: 33.2 g/dL (ref 30.0–36.0)
MCV: 94.9 fL (ref 80.0–100.0)
Platelets: 93 10*3/uL — ABNORMAL LOW (ref 150–400)
RBC: 4.1 MIL/uL — ABNORMAL LOW (ref 4.22–5.81)
RDW: 13.2 % (ref 11.5–15.5)
WBC: 9.9 10*3/uL (ref 4.0–10.5)
nRBC: 0 % (ref 0.0–0.2)

## 2021-09-03 LAB — GLUCOSE, CAPILLARY
Glucose-Capillary: 118 mg/dL — ABNORMAL HIGH (ref 70–99)
Glucose-Capillary: 124 mg/dL — ABNORMAL HIGH (ref 70–99)
Glucose-Capillary: 234 mg/dL — ABNORMAL HIGH (ref 70–99)
Glucose-Capillary: 235 mg/dL — ABNORMAL HIGH (ref 70–99)
Glucose-Capillary: 235 mg/dL — ABNORMAL HIGH (ref 70–99)
Glucose-Capillary: 179 mg/dL — ABNORMAL HIGH (ref 70–99)
Glucose-Capillary: 149 mg/dL — ABNORMAL HIGH (ref 70–99)
Glucose-Capillary: 186 mg/dL — ABNORMAL HIGH (ref 70–99)

## 2021-09-03 LAB — BASIC METABOLIC PANEL
Anion gap: 8 (ref 5–15)
BUN: 65 mg/dL — ABNORMAL HIGH (ref 8–23)
CO2: 28 mmol/L (ref 22–32)
Calcium: 9 mg/dL (ref 8.9–10.3)
Chloride: 104 mmol/L (ref 98–111)
Creatinine, Ser: 3.51 mg/dL — ABNORMAL HIGH (ref 0.61–1.24)
GFR, Estimated: 17 mL/min — ABNORMAL LOW (ref >60)
Glucose, Bld: 120 mg/dL — ABNORMAL HIGH (ref 70–99)
Potassium: 4.6 mmol/L (ref 3.5–5.1)
Sodium: 140 mmol/L (ref 135–145)

## 2021-09-03 LAB — COOXEMETRY PANEL
Carboxyhemoglobin: 1.5 % (ref 0.5–1.5)
Methemoglobin: <0.7 % (ref 0.0–1.5)
O2 Saturation: 73.5 %
Total hemoglobin: 12.1 g/dL (ref 12.0–16.0)

## 2021-09-03 LAB — BLOOD GAS, ARTERIAL
Acid-Base Excess: 4.2 mmol/L — ABNORMAL HIGH (ref 0.0–2.0)
Bicarbonate: 30.3 mmol/L — ABNORMAL HIGH (ref 20.0–28.0)
Drawn by: 548791
O2 Saturation: 99.4 %
Patient temperature: 36.8
pCO2 arterial: 50 mmHg — ABNORMAL HIGH (ref 32–48)
pH, Arterial: 7.39 (ref 7.35–7.45)
pO2, Arterial: 171 mmHg — ABNORMAL HIGH (ref 83–108)

## 2021-09-03 LAB — COMPREHENSIVE METABOLIC PANEL
ALT: 21 U/L (ref 0–44)
AST: 45 U/L — ABNORMAL HIGH (ref 15–41)
Albumin: 2.4 g/dL — ABNORMAL LOW (ref 3.5–5.0)
Alkaline Phosphatase: 123 U/L (ref 38–126)
Anion gap: 12 (ref 5–15)
BUN: 68 mg/dL — ABNORMAL HIGH (ref 8–23)
CO2: 28 mmol/L (ref 22–32)
Calcium: 8.8 mg/dL — ABNORMAL LOW (ref 8.9–10.3)
Chloride: 103 mmol/L (ref 98–111)
Creatinine, Ser: 3.46 mg/dL — ABNORMAL HIGH (ref 0.61–1.24)
GFR, Estimated: 17 mL/min — ABNORMAL LOW (ref >60)
Glucose, Bld: 308 mg/dL — ABNORMAL HIGH (ref 70–99)
Potassium: 4.8 mmol/L (ref 3.5–5.1)
Sodium: 143 mmol/L (ref 135–145)
Total Bilirubin: 0.5 mg/dL (ref 0.3–1.2)
Total Protein: 6.6 g/dL (ref 6.5–8.1)

## 2021-09-03 LAB — CULTURE, BLOOD (ROUTINE X 2)
Culture: NO GROWTH
Special Requests: ADEQUATE

## 2021-09-03 LAB — BRAIN NATRIURETIC PEPTIDE: B Natriuretic Peptide: 230.3 pg/mL — ABNORMAL HIGH (ref 0.0–100.0)

## 2021-09-03 LAB — LACTIC ACID, PLASMA
Lactic Acid, Venous: 2.4 mmol/L (ref 0.5–1.9)
Lactic Acid, Venous: 2 mmol/L (ref 0.5–1.9)

## 2021-09-03 LAB — PHOSPHORUS: Phosphorus: 5.6 mg/dL — ABNORMAL HIGH (ref 2.5–4.6)

## 2021-09-03 LAB — PROTIME-INR
INR: 1.3 — ABNORMAL HIGH (ref 0.8–1.2)
Prothrombin Time: 16.1 seconds — ABNORMAL HIGH (ref 11.4–15.2)

## 2021-09-03 LAB — MAGNESIUM: Magnesium: 3.2 mg/dL — ABNORMAL HIGH (ref 1.7–2.4)

## 2021-09-03 LAB — TROPONIN I (HIGH SENSITIVITY): Troponin I (High Sensitivity): 35 ng/L — ABNORMAL HIGH (ref <18)

## 2021-09-03 MED ORDER — SODIUM CHLORIDE 0.9 % IV SOLN
2.0000 g | INTRAVENOUS | Status: DC
  Administered 2021-09-03: 2 g via INTRAVENOUS
  Filled 2021-09-03 (×2): qty 12.5

## 2021-09-03 MED ORDER — FENTANYL BOLUS VIA INFUSION
25.0000 ug | INTRAVENOUS | Status: DC | PRN
  Administered 2021-09-03: 50 ug via INTRAVENOUS
  Administered 2021-09-04: 25 ug via INTRAVENOUS
  Administered 2021-09-04: 100 ug via INTRAVENOUS
  Administered 2021-09-05: 50 ug via INTRAVENOUS
  Administered 2021-09-05: 100 ug via INTRAVENOUS
  Administered 2021-09-06 (×3): 50 ug via INTRAVENOUS
  Administered 2021-09-06: 25 ug via INTRAVENOUS
  Administered 2021-09-07: 50 ug via INTRAVENOUS
  Filled 2021-09-03: qty 100

## 2021-09-03 MED ORDER — INSULIN ASPART 100 UNIT/ML IJ SOLN
0.0000 [IU] | INTRAMUSCULAR | Status: DC
  Administered 2021-09-03 – 2021-09-04 (×3): 3 [IU] via SUBCUTANEOUS
  Administered 2021-09-04 – 2021-09-05 (×3): 2 [IU] via SUBCUTANEOUS
  Administered 2021-09-05: 3 [IU] via SUBCUTANEOUS
  Administered 2021-09-05: 2 [IU] via SUBCUTANEOUS
  Administered 2021-09-05 (×3): 3 [IU] via SUBCUTANEOUS
  Administered 2021-09-05: 2 [IU] via SUBCUTANEOUS
  Administered 2021-09-06 (×3): 3 [IU] via SUBCUTANEOUS
  Administered 2021-09-06: 5 [IU] via SUBCUTANEOUS
  Administered 2021-09-06 – 2021-09-07 (×5): 3 [IU] via SUBCUTANEOUS
  Administered 2021-09-07: 2 [IU] via SUBCUTANEOUS
  Administered 2021-09-07 – 2021-09-08 (×5): 3 [IU] via SUBCUTANEOUS
  Administered 2021-09-08: 2 [IU] via SUBCUTANEOUS
  Administered 2021-09-08 – 2021-09-09 (×3): 3 [IU] via SUBCUTANEOUS
  Administered 2021-09-09: 5 [IU] via SUBCUTANEOUS
  Administered 2021-09-09 – 2021-09-11 (×10): 3 [IU] via SUBCUTANEOUS
  Administered 2021-09-11: 2 [IU] via SUBCUTANEOUS
  Administered 2021-09-11: 5 [IU] via SUBCUTANEOUS
  Administered 2021-09-11: 3 [IU] via SUBCUTANEOUS

## 2021-09-03 MED ORDER — ACETAMINOPHEN 325 MG PO TABS
650.0000 mg | ORAL_TABLET | Freq: Four times a day (QID) | ORAL | Status: DC | PRN
  Administered 2021-09-04 – 2021-09-11 (×11): 650 mg
  Filled 2021-09-03 (×13): qty 2

## 2021-09-03 MED ORDER — NOREPINEPHRINE 4 MG/250ML-% IV SOLN: 0.0000 ug/min | INTRAVENOUS | Status: DC

## 2021-09-03 MED ORDER — KETAMINE HCL 50 MG/5ML IJ SOSY
PREFILLED_SYRINGE | INTRAMUSCULAR | Status: AC
  Filled 2021-09-03: qty 5

## 2021-09-03 MED ORDER — CARVEDILOL 6.25 MG PO TABS: 6.2500 mg | ORAL_TABLET | Freq: Two times a day (BID) | ORAL | Status: DC

## 2021-09-03 MED ORDER — ROCURONIUM BROMIDE 10 MG/ML (PF) SYRINGE
PREFILLED_SYRINGE | INTRAVENOUS | Status: AC
  Administered 2021-09-03: 100 mg
  Filled 2021-09-03: qty 10

## 2021-09-03 MED ORDER — FENTANYL CITRATE (PF) 100 MCG/2ML IJ SOLN
25.0000 ug | Freq: Once | INTRAMUSCULAR | Status: AC
  Administered 2021-09-03: 100 ug via INTRAVENOUS

## 2021-09-03 MED ORDER — ORAL CARE MOUTH RINSE
15.0000 mL | OROMUCOSAL | Status: DC
  Administered 2021-09-03 (×4): 15 mL via OROMUCOSAL

## 2021-09-03 MED ORDER — FENTANYL 2500MCG IN NS 250ML (10MCG/ML) PREMIX INFUSION
25.0000 ug/h | INTRAVENOUS | Status: DC
  Administered 2021-09-03: 50 ug/h via INTRAVENOUS
  Administered 2021-09-04: 25 ug/h via INTRAVENOUS
  Administered 2021-09-04: 100 ug/h via INTRAVENOUS
  Administered 2021-09-06: 75 ug/h via INTRAVENOUS
  Filled 2021-09-03 (×5): qty 250

## 2021-09-03 MED ORDER — PRAVASTATIN SODIUM 40 MG PO TABS
40.0000 mg | ORAL_TABLET | Freq: Every evening | ORAL | Status: DC
  Administered 2021-09-03 – 2021-09-11 (×9): 40 mg
  Filled 2021-09-03 (×12): qty 1

## 2021-09-03 MED ORDER — ACETAMINOPHEN 650 MG RE SUPP: 650.0000 mg | Freq: Four times a day (QID) | RECTAL | Status: DC | PRN

## 2021-09-03 MED ORDER — PHENOBARBITAL 32.4 MG PO TABS
64.8000 mg | ORAL_TABLET | Freq: Two times a day (BID) | ORAL | Status: DC
  Administered 2021-09-03 – 2021-09-12 (×20): 64.8 mg
  Filled 2021-09-03 (×20): qty 2

## 2021-09-03 MED ORDER — NOREPINEPHRINE 16 MG/250ML-% IV SOLN
0.0000 ug/min | INTRAVENOUS | Status: DC
  Administered 2021-09-03: 15 ug/min via INTRAVENOUS
  Administered 2021-09-04: 5 ug/min via INTRAVENOUS
  Filled 2021-09-03 (×2): qty 250

## 2021-09-03 MED ORDER — LACTATED RINGERS IV BOLUS
500.0000 mL | Freq: Once | INTRAVENOUS | Status: AC
  Administered 2021-09-03: 500 mL via INTRAVENOUS

## 2021-09-03 MED ORDER — FENTANYL CITRATE (PF) 100 MCG/2ML IJ SOLN
INTRAMUSCULAR | Status: AC
  Filled 2021-09-03: qty 2

## 2021-09-03 MED ORDER — LINEZOLID 600 MG/300ML IV SOLN
600.0000 mg | Freq: Two times a day (BID) | INTRAVENOUS | Status: DC
  Administered 2021-09-03 – 2021-09-04 (×3): 600 mg via INTRAVENOUS
  Filled 2021-09-03 (×3): qty 300

## 2021-09-03 MED ORDER — POLYETHYLENE GLYCOL 3350 17 G PO PACK
17.0000 g | PACK | Freq: Every day | ORAL | Status: DC
  Administered 2021-09-03 – 2021-09-04 (×2): 17 g
  Filled 2021-09-03 (×2): qty 1

## 2021-09-03 MED ORDER — NOREPINEPHRINE 4 MG/250ML-% IV SOLN
INTRAVENOUS | Status: AC
  Administered 2021-09-03: 5 ug/min via INTRAVENOUS
  Filled 2021-09-03: qty 250

## 2021-09-03 MED ORDER — APIXABAN 5 MG PO TABS
5.0000 mg | ORAL_TABLET | Freq: Two times a day (BID) | ORAL | Status: DC
Start: 2021-09-03 — End: 2021-09-12
  Administered 2021-09-03 – 2021-09-12 (×20): 5 mg
  Filled 2021-09-03 (×20): qty 1

## 2021-09-03 MED ORDER — SODIUM CHLORIDE 0.9 % IV SOLN
250.0000 mL | INTRAVENOUS | Status: DC
  Administered 2021-09-03 – 2021-09-08 (×4): 250 mL via INTRAVENOUS

## 2021-09-03 MED ORDER — ORAL CARE MOUTH RINSE
15.0000 mL | OROMUCOSAL | Status: DC
  Administered 2021-09-03 – 2021-09-04 (×4): 15 mL via OROMUCOSAL

## 2021-09-03 MED ORDER — SODIUM CHLORIDE 0.9 % IV SOLN: 2.0000 g | Freq: Three times a day (TID) | INTRAVENOUS | Status: DC

## 2021-09-03 MED ORDER — ETOMIDATE 2 MG/ML IV SOLN
INTRAVENOUS | Status: AC
  Administered 2021-09-03: 20 mg
  Filled 2021-09-03: qty 20

## 2021-09-03 MED ORDER — INSULIN ASPART 100 UNIT/ML IJ SOLN: 0.0000 [IU] | INTRAMUSCULAR | Status: DC

## 2021-09-03 MED ORDER — SUCCINYLCHOLINE CHLORIDE 200 MG/10ML IV SOSY
PREFILLED_SYRINGE | INTRAVENOUS | Status: AC
  Filled 2021-09-03: qty 10

## 2021-09-03 MED ORDER — NOREPINEPHRINE 4 MG/250ML-% IV SOLN: 2.0000 ug/min | INTRAVENOUS | Status: DC

## 2021-09-03 MED ORDER — DOCUSATE SODIUM 50 MG/5ML PO LIQD
100.0000 mg | Freq: Two times a day (BID) | ORAL | Status: DC
  Administered 2021-09-03 – 2021-09-08 (×12): 100 mg
  Filled 2021-09-03 (×13): qty 10

## 2021-09-03 MED ORDER — MIDAZOLAM HCL 2 MG/2ML IJ SOLN
INTRAMUSCULAR | Status: AC
  Filled 2021-09-03: qty 2

## 2021-09-03 MED ORDER — PANTOPRAZOLE 2 MG/ML SUSPENSION
40.0000 mg | Freq: Every day | ORAL | Status: DC
  Administered 2021-09-03 – 2021-09-12 (×10): 40 mg
  Filled 2021-09-03 (×10): qty 20

## 2021-09-03 MED ORDER — AMIODARONE HCL 200 MG PO TABS
200.0000 mg | ORAL_TABLET | Freq: Every day | ORAL | Status: DC
  Administered 2021-09-03 – 2021-09-12 (×10): 200 mg
  Filled 2021-09-03 (×10): qty 1

## 2021-09-03 MED ORDER — HYDRALAZINE HCL 50 MG PO TABS
50.0000 mg | ORAL_TABLET | Freq: Three times a day (TID) | ORAL | Status: DC
Start: 2021-09-03 — End: 2021-09-03

## 2021-09-03 NOTE — Progress Notes (Signed)
US guided PIV order placed d/t start of pressors. PA in room to place a line. Will complete order.  ? ?Krosby Ritchie Lorita Officer, RN ? ?

## 2021-09-03 NOTE — PMR Pre-admission (Shared)
PMR Admission Coordinator Pre-Admission Assessment ? ?Patient: Ricky Hayden is an 76 y.o., male ?MRN: [id number] ?DOB: [birth year] ?Height: 5\' 7"  (170.2 cm) ?Weight: 107.2 kg ? ?Insurance Information ?HMO:     PPO:      PCP:      IPA:      80/20:      OTHER:  ?PRIMARY: [facility]       Policy#: [id number]      Subscriber: Pt ?CM Name: ***      Phone#: ***     Fax#: *** ?Pre-Cert#: ***      Employer: *** ?Benefits:  Phone #: ***     Name: *** ?Eff. Date: ***     Deduct: ***      Out of Pocket Max: ***      Life Max: *** ?CIR: ***      SNF: *** ?Outpatient: ***     Co-Pay: *** ?Home Health: ***      Co-Pay: *** ?DME: ***     Co-Pay: *** ?Providers: *** ?SECONDARY: Medicare Part A      Policy#: [id number]     Phone#: *** ? ?Financial Counselor:       Phone#:  ? ?The ?Data Collection Information Summary? for patients in Inpatient Rehabilitation Facilities with attached ?Privacy Act [place] Records? was provided and verbally reviewed with: Patient ? ?Emergency Contact Information ?Contact Information   ? ? Name Relation Home Work Mobile  ? Ricky, Hayden Daughter   [phone]  ? Ricky Hayden Daughter   [phone]  ? Ricky, Hayden [phone]    ? ?  ? ? ?Current Medical History  ?Patient Admitting Diagnosis: Sepsis d/t UTI ?History of Present Illness: Ricky Hayden is a 76 y.o. 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 .  Had gone to the urgent care about a week prior  and was prescribed Bactrim.  Urine cultures show Proteus mirabilis. The evening PTA, Pt. started having fever and chills and shortness of breath.  He was as brought to the ER 08/29/21. Initially patient required BiPAP for shortness of breath but was eventually weaned off.  Patient had a fever 103 ?F with UA showing features concerning for UTI.  Had blood  cultures urine cultures drawn and started on empiric antibiotics admitted for sepsis from UTI.  Chest x-ray was unremarkable. PT and OT were consulted during admission and recommended CIR to assist return to PLOF.   ?  ? ?Patient's medical record from [facility] has been reviewed by the rehabilitation admission coordinator and physician. ? ?Past Medical History  ?Past Medical History:  ?Diagnosis Date  ? Arthritis   ? osteoarthritis of left knee  ? Ascending aorta dilatation (HCC)   ? 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 Cardiac arrhythmia   ? life threatening, secondary to CCB vs b- blockers  ? Cardiomyopathy ([place])   ? Chronic joint pain   ? Chronic systolic CHF (congestive heart failure) ([place])   ? CKD (chronic kidney disease), stage III ([place])   ? COPD (chronic obstructive pulmonary disease) ([place])   ? Coronary artery disease   ? Dilated aortic root ([place])   ? Enlarged prostate   ? 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 GERD (gastroesophageal reflux disease)   ? Gout   ? Hiatal hernia   ? Hyperlipidemia   ?  Hypertension   ? Intracranial hematoma ([place]) 1995  ? history of, s/p evacuation by Dr. Sherwood Gambler  ? Nocturia   ? Obesity   ? Pansinusitis   ? a.  complicated by brain abscess and bleeding requiring craniotomy in 1995.  ? PONV (postoperative nausea and vomiting)   ? Sinusitis   ? s/p ethmoidectomy and nasal septoplasty  ? Vertigo   ? intermitantly  ? ? ?Has the patient had major surgery during 100 days prior to admission? No ? ?Family History   ?family history includes Heart attack (age of onset: 13) in his father; Heart failure (age of onset: 54) in his mother. ? ?Current Medications ? ?Current Facility-Administered Medications:  ?  acetaminophen (TYLENOL) tablet 650 mg, 650 mg, Oral, Q6H PRN, 650 mg at 09/01/21 2041 **OR** acetaminophen (TYLENOL) suppository 650 mg, 650 mg, Rectal, Q6H PRN, Rise Patience, MD ?  albuterol (PROVENTIL) (2.5 MG/3ML) 0.083% nebulizer solution 2.5 mg,  2.5 mg, Nebulization, Q2H PRN, Little Ishikawa, MD ?  amiodarone (PACERONE) tablet 200 mg, 200 mg, Oral, Daily, Gean Birchwood N, MD, 200 mg at 09/02/21 1007 ?  apixaban (ELIQUIS) tablet 5 mg, 5 mg, Oral, BID, Little Ishikawa, MD, 5 mg at 09/02/21 2351 ?  carvedilol (COREG) tablet 6.25 mg, 6.25 mg, Oral, BID WC, Gleason, Otilio Carpen, PA-C, 6.25 mg at 09/02/21 1006 ?  ceFAZolin (ANCEF) IVPB 2g/100 mL premix, 2 g, Intravenous, Q12H, Little Ishikawa, MD, Stopped at 09/02/21 2206 ?  chlorhexidine (PERIDEX) 0.12 % solution 15 mL, 15 mL, Mouth Rinse, BID, Little Ishikawa, MD, 15 mL at 09/02/21 2123 ?  Chlorhexidine Gluconate Cloth 2 % PADS 6 each, 6 each, Topical, Daily, Little Ishikawa, MD, 6 each at 09/02/21 1007 ?  finasteride (PROSCAR) tablet 5 mg, 5 mg, Oral, Daily, Rise Patience, MD, 5 mg at 09/02/21 1006 ?  hydrALAZINE (APRESOLINE) tablet 50 mg, 50 mg, Oral, Q8H, Rise Patience, MD, 50 mg at 09/03/21 0534 ?  ipratropium-albuterol (DUONEB) 0.5-2.5 (3) MG/3ML nebulizer solution 3 mL, 3 mL, Nebulization, Q6H, Gleason, Otilio Carpen, PA-C, 3 mL at 09/03/21 0306 ?  MEDLINE mouth rinse, 15 mL, Mouth Rinse, q12n4p, Little Ishikawa, MD ?  metoprolol tartrate (LOPRESSOR) injection 2.5 mg, 2.5 mg, Intravenous, Q6H PRN, [place] Crane, Carole N, DO ?  pantoprazole (PROTONIX) EC tablet 40 mg, 40 mg, Oral, Daily, Rise Patience, MD, 40 mg at 09/02/21 1006 ?  PHENobarbital (LUMINAL) tablet 64.8 mg, 64.8 mg, Oral, BID, Rise Patience, MD, 64.8 mg at 09/02/21 2351 ?  pravastatin (PRAVACHOL) tablet 40 mg, 40 mg, Oral, QPM, Rise Patience, MD, 40 mg at 09/01/21 1840 ? ?Patients Current Diet:  ?Diet Order   ? ?       ?  Diet NPO time specified  Diet effective now       ?  ? ?  ?  ? ?  ? ? ?Precautions / Restrictions ?Precautions ?Precautions: Fall ?Precaution Comments: watch 02 ?Restrictions ?Weight Bearing Restrictions: No  ? ?Has the patient had 2 or more falls or a fall with injury in  the past year? Yes ? ?Prior Activity Level ?Limited Community (1-2x/wk): gets out of house 2-3 days/week ? ?Prior Functional Level ?Self Care: Did the patient need help bathing, dressing, using the toilet or eating? Independent ? ?Indoor Mobility: Did the patient need assistance with walking from room to room (with or without device)? Independent ? ?Stairs: Did the patient need assistance with internal or external stairs (with  or without device)? Independent ? ?Functional Cognition: Did the patient need help planning regular tasks such as shopping or remembering to take medications? Needed some help ? ?Patient Information ?Are you of Hispanic, Latino/a,or Spanish origin?: A. No, not of Hispanic, Latino/a, or Spanish origin ?What is your race?: A. White ?Do you need or want an interpreter to communicate with a doctor or health care staff?: 0. No ? ?Patient's Response To:  ?Health Literacy and Transportation ?Is the patient able to respond to health literacy and transportation needs?: Yes ?Health Literacy - How often do you need to have someone help you when you read instructions, pamphlets, or other written material from your doctor or pharmacy?: Never ?In the past 12 months, has lack of transportation kept you from medical appointments or from getting medications?: No ?In the past 12 months, has lack of transportation kept you from meetings, work, or from getting things needed for daily living?: No ? ?Home Assistive Devices / Equipment ?Home Assistive Devices/Equipment: None ?Home Equipment: Rollator (4 wheels), Hospital bed, Shower seat ? ?Prior Device Use: Indicate devices/aids used by the patient prior to current illness, exacerbation or injury? Walker ? ?Current Functional Level ?Cognition ? Overall Cognitive Status: Difficult to assess ?Difficult to assess due to: Level of arousal ?Current Attention Level: Focused ?Orientation Level: Oriented to person ?Following Commands: Follows one step commands with  increased time, Follows one step commands inconsistently ?General Comments: Followed some 1 step commands when aroused ?   ?Extremity Assessment ?(includes Sensation/Coordination) ? Upper Extremity Assessment:

## 2021-09-03 NOTE — Progress Notes (Signed)
Inpatient Rehab Admissions Coordinator:  ? ?Pt. Now intubated, not appropriate for CIR. CIR will follow at a distance for now.  ? ?Clemens Catholic, MS, CCC-SLP ?Rehab Admissions Coordinator  ?[phone] (celll) ?[phone] (office) ? ?

## 2021-09-03 NOTE — Progress Notes (Signed)
?   09/03/21 0840  ?Clinical Encounter Type  ?Visited With Other (Comment) ?(Response to CODE BLUE)  ?Visit Type Initial ?(Paged to Unit for Support)  ?Referral From Nurse  ?Consult/Referral To Chaplain  ? ?Chaplain responded to Code Blue to provide emotional support for medical team. There was no family available; however, Chaplain is available to follow-up with daughter when she arrives at hospital.  ? ?Melody Haver, Resident Chaplain ?([phone] ?

## 2021-09-03 NOTE — Progress Notes (Signed)
ABG did not result on iStat. Sample sent to lab. RT will continue to monitor.  ?

## 2021-09-03 NOTE — Procedures (Signed)
Central Venous Catheter Insertion Procedure Note ? ?Ricky Hayden  ?[id number]  ?02-01-1946 ? ?Date:09/03/21  ?Time:11:46 AM  ? ?Provider Performing:Keri Tavella D Rollene Rotunda  ? ?Procedure: Insertion of Non-tunneled Central Venous Catheter(36556) with US guidance (81025)  ? ? ? ? ?Indication(s) ?Medication administration ? ?Consent ?Unable to obtain consent due to emergent nature of procedure. ? ?Anesthesia ?Topical only with 1% lidocaine  ? ?Timeout ?Verified patient identification, verified procedure, site/side was marked, verified correct patient position, special equipment/implants available, medications/allergies/relevant history reviewed, required imaging and test results available. ? ?Sterile Technique ?Maximal sterile technique including full sterile barrier drape, hand hygiene, sterile gown, sterile gloves, mask, hair covering, sterile ultrasound probe cover (if used). ? ?Procedure Description ?Area of catheter insertion was cleaned with chlorhexidine and draped in sterile fashion.  With real-time ultrasound guidance a central venous catheter was placed into the right internal jugular vein. Nonpulsatile blood flow and easy flushing noted in all ports.  The catheter was sutured in place and sterile dressing applied. ? ?Complications/Tolerance ?None; patient tolerated the procedure well. ?Chest X-ray is ordered to verify placement for internal jugular or subclavian cannulation.   Chest x-ray is not ordered for femoral cannulation. ? ?EBL ?Minimal ? ?Specimen(s) ?None ? ?Mikki Harbor, PA-C ?[facility] ?09/03/2021, 11:48 AM ? ?Please see [email or web address] for pager details. ? ?From 7A-7P if no response, please call [phone]. ?After hours, please call ELink [phone]. ? ?

## 2021-09-03 NOTE — Progress Notes (Signed)
IVT present at code. ?

## 2021-09-03 NOTE — Progress Notes (Signed)
? ?NAME:  Ricky Hayden, MRN:  [id number], DOB:  1945/07/05, LOS: 5 ?ADMISSION DATE:  08/28/2021, CONSULTATION DATE:  5/2 ?REFERRING MD:  Dr. Avon Gully, CHIEF COMPLAINT:  Acute respiratory failure  ? ?History of Present Illness:  ?History obtained from chart review and daughter Ricky Hayden at bedside. ? ?Ricky Hayden is a 76 yo gentleman w/ pertinent PMH of chronic HFrEF with ICD, a-fib, CKD stage IIIb, COPD who presented to [facility] on 5/2 w/ hematuria x 3 weeks. The week prior to admission he was started on Bactrim for urine cultures showing proteus mirabilis. He began having worsening fever and SOB and was brought to [facility] ED on 4/27.  ? ?At admission he was initially started on BiPAP for SOB. Fever 103 F. Started on empiric abx. CT abdomen showing nonobstructive nephrolithiasis. Urology consulted with no indication for procedure.  Further work up showing sepsis from UTI w/ pyelonephritis. Blood cultures were positive for proteus mirabilis. On Ancef with ID managing antibiotics.  ? ?On 5/2 he began having decreased LOC and increased O2 requirements. Per his RN this began around 11AM and they waited for 2 ABG results before he was placed on BiPAP. Once he was on Bipap he had improvement in saturations. There was concern this was due to volume overload but concerning to give lasix with worsening kidney function.  Abg 7.3/ 61/ 61/ 30. PCCM consulted for hypoxia evaluation and management. ? ?Pertinent  Medical History  ? 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 ? ?Significant Hospital Events: ?Including procedures, antibiotic start and stop dates in addition to other pertinent events   ?4/27: admitted to [facility] for sepsis 2/2 UTI, started ceftriaxone and azithro ?4/27-5/1 on ceftriaxone ?5/2:  antibiotics deescalated to cefazolin. PCCM consult for hypoxia. Placed on BIPAP ?5/3: Bipap  all night, worsening mental status and hypoxemia, intubated, short PEA arrest after intubation, hypotensive, central line placed, CXR in AM clear, CXR after intuabntion diffuse R sided infiltrates concerning for aspiration vs HAP ? ?Interim History / Subjective:  ?Bipap all night, worsening mental status and hypoxemia, intubated, short PEA arrest after intubation, hypotensive, central line placed, CXR in AM clear, CXR after intuabntion diffuse R sided infiltrates concerning for aspiration vs HAP ? ?Objective   ?Blood pressure (!) 80/62, pulse 81, temperature 98.2 ?F (36.8 ?C), temperature source Oral, resp. rate (!) 22, height 5\' 7"  (1.702 m), weight 107.2 kg, SpO2 94 %. ?CVP:  [4 mmHg-8 mmHg] 4 mmHg  ?Vent Mode: PRVC ?FiO2 (%):  [30 %-100 %] 80 % ?Set Rate:  [22 bmp] 22 bmp ?Vt Set:  [520 mL] 520 mL ?PEEP:  [12 cmH20] 12 cmH20 ?Plateau Pressure:  [22 cmH20-23 cmH20] 22 cmH20  ? ?Intake/Output Summary (Last 24 hours) at 09/03/2021 1416 ?Last data filed at 09/03/2021 1100 ?Gross per 24 hour  ?Intake 1047.7 ml  ?Output 1700 ml  ?Net -652.3 ml  ? ? ?Filed Weights  ? 08/29/21 0625  ?Weight: 107.2 kg  ? ? ?Examination: ?General: ill appearing man lying in bed in NAD, somnolent ?HENT: [place]/AT, eyes anicteric ?Lungs:Distant, not movign air on bipap.  ?Cardiovascular: S1S2, RRR ?Abdomen: obese, soft, NT ?Extremities: minimal peripheral edema, no cyanosis ?Neuro: barely arousal with sternal rub,  ?Derm: warm, dry, no diffuse rashes. ? ?CXR personally reviewed x2 and discussed above ? ?Resolved Hospital Problem list   ? ? ?Assessment & Plan:  ? ?Acute  metabolic encephalopathy- not explained by hypercapnia. Suppose related to severe sepsis, now intubated partly due to worsening encephalopathy ?-RASS 0, -1, fentanyl gtt, minimize sedation ? ?Hx of brain surgery, on seizure prophylaxis ?-phenobarb level ok, continue ? ?Shock: septic ?-co-ox 73.5, vasodilated ?-CVP 8 ?-LR 500 cc bolus, consider additional with close monitoring ? ?Acute  respiratory failure with hypoxia and hypercapnia- acute worsening 5/3 with CXR concerning for new R pna, aspiration vs HAP ?Chronic severe COPD- does not appear acutely exacerbated ?-PRVC, VAP bundle ?-Escalate abx to cefepime and linezolid due to concern for HAP vs aspiration (prev cefazolin for proteus UTI) ? ?Chronic HFrEF: LVEF 40-45% ?-BNP normal, mildly elevated ?-hold coreg and hydralazine ?-holding PTA jardiance, Entresto  ? ?AKI on chronic kidney disease stage IIIb ?-renally dose meds, avoid nephrotoxic meds ?-strict I/Os ?-monitor ?-hold Entresto ?-NE for MAP > 65 ? ?Sepsis due to Proteus pyelonephritis and bacteremia and HAP ?-appreciate ID's recommendations ?-As above ? ?A-fib on Eliquis  ?-con't eliquis and amiodarone ? ?Acute anemia likely due to acute illness ?-monitor ?-transfuse for Hb<7 or hemodynamically significant bleeding ? ?Acute thrombocytopenia ?-monitor ? ?HLD ?-pravastatin ? ? ? ? ?Best Practice (right click and "Reselect all SmartList Selections" daily)  ? ?Diet/type: NPO w/ oral meds ?DVT prophylaxis: DOAC ?GI prophylaxis: PPI ?Lines: N/A ?Foley:  Yes, and it is still needed ?Code Status:  full code ?Last date of multidisciplinary goals of care discussion [5/3- daughter Ricky Hayden updated on intubation and code, full scope] ? ? ?Labs   ?CBC: ?Recent Labs  ?Lab 08/28/21 ?4481 08/28/21 ?2358 08/29/21 ?8563 08/30/21 ?1497 08/31/21 ?1201 09/01/21 ?0308 09/02/21 ?0263 09/03/21 ?0301 09/03/21 ?7858  ?WBC 7.6  --  8.5   < > 9.0 11.3* 8.7 9.9 17.7*  ?NEUTROABS 6.4  --  7.4  --   --   --   --   --  12.9*  ?HGB 14.6   < > 13.9   < > 12.3* 12.5* 12.5* 12.9* 13.3  ?HCT 44.5   < > 42.4   < > 37.3* 38.3* 37.0* 38.9* 41.9  ?MCV 95.3  --  95.3   < > 95.6 95.3 94.6 94.9 97.0  ?PLT 114*  --  97*   < > 85* 88* 74* 93* 109*  ? < > = values in this interval not displayed.  ? ? ? ?Basic Metabolic Panel: ?Recent Labs  ?Lab 08/30/21 ?8502 08/31/21 ?7741 09/01/21 ?0308 09/02/21 ?2878 09/03/21 ?0301 09/03/21 ?6767   ?NA 140 137 137 138 140 143  ?K 3.8 4.0 4.3 4.6 4.6 4.8  ?CL 106 102 102 103 104 103  ?CO2 27 27 26 27 28 28   ?GLUCOSE 115* 136* 160* 127* 120* 308*  ?BUN 30* 39* 44* 54* 65* 68*  ?CREATININE 2.23* 3.18* 3.25* 3.26* 3.51* 3.46*  ?CALCIUM 8.7* 8.4* 8.4* 8.8* 9.0 8.8*  ?MG  --   --   --   --   --  3.2*  ?PHOS 4.1  --   --   --   --  5.6*  ? ? ?GFR: ?Estimated Creatinine Clearance: 20.9 mL/min (A) (by C-G formula based on SCr of 3.46 mg/dL (H)). ?Recent Labs  ?Lab 08/29/21 ?0145 08/29/21 ?2094 09/01/21 ?0308 09/02/21 ?7096 09/02/21 ?2836 09/03/21 ?0301 09/03/21 ?6294 09/03/21 ?1206  ?WBC  --    < > 11.3* 8.7  --  9.9 17.7*  --   ?LATICACIDVEN 1.0  --   --   --  0.9  --  2.4* 2.0*  ? < > =  values in this interval not displayed.  ? ? ? ?Liver Function Tests: ?Recent Labs  ?Lab 08/28/21 ?1914 08/29/21 ?7829 08/30/21 ?0152 09/02/21 ?1840 09/03/21 ?0850  ?AST 21 21  --  36 45*  ?ALT 20 18  --  28 21  ?ALKPHOS 62 55  --  116 123  ?BILITOT 0.7 0.7  --  0.5 0.5  ?PROT 7.5 6.9  --  6.8 6.6  ?ALBUMIN 3.6 3.2* 3.0* 2.5* 2.4*  ? ? ?No results for input(s): LIPASE, AMYLASE in the last 168 hours. ?Recent Labs  ?Lab 09/02/21 ?1840  ?AMMONIA 45*  ? ? ?ABG ?   ?Component Value Date/Time  ? PHART 7.39 09/03/2021 1148  ? PCO2ART 50 (H) 09/03/2021 1148  ? PO2ART 171 (H) 09/03/2021 1148  ? HCO3 30.3 (H) 09/03/2021 1148  ? TCO2 29 08/29/2021 0102  ? O2SAT 73.5 09/03/2021 1206  ? ?  ? ?Coagulation Profile: ?Recent Labs  ?Lab 08/28/21 ?2347 09/03/21 ?0850  ?INR 1.5* 1.3*  ? ? ? ?Cardiac Enzymes: ?No results for input(s): CKTOTAL, CKMB, CKMBINDEX, TROPONINI in the last 168 hours. ? ?HbA1C: ?Hemoglobin A1C  ?Date/Time Value Ref Range Status  ?10/05/2012 03:13 PM 5.4  Final  ? ?Hgb A1c MFr Bld  ?Date/Time Value Ref Range Status  ?09/02/2021 06:22 PM 5.7 (H) 4.8 - 5.6 % Final  ?  Comment:  ?  (NOTE) ?Pre diabetes:          5.7%-6.4% ? ?Diabetes:              >6.4% ? ?Glycemic control for   <7.0% ?adults with diabetes ?  ?02/06/2008 11:25 PM    Final  ? 5.4 ?(NOTE)   The ADA recommends the following therapeutic goal for glycemic   control related to Hgb A1C measurement:   Goal of Therapy:   < 7.0% Hgb A1C   Reference: American Diabetes Associati

## 2021-09-03 NOTE — Progress Notes (Signed)
?  [facility] ? ? ?Reason for visit: Follow up on fever ? ?Interval History: s/p resuscitation and now intubated.   No further fever since yesterday.   ? ? ?Physical Exam: ?Constitutional:  ?Vitals:  ? 09/03/21 1000 09/03/21 1015  ?BP: (!) 89/57 (!) 99/57  ?Pulse: 84 84  ?Resp: 20 18  ?Temp:    ?SpO2: 97% 99%  ? patient is sedated ?HENT: +ET ?Respiratory: respiratory effort on vent ? ?Review of Systems: ?Unable to be assessed due to patient factors ? ?Lab Results  ?Component Value Date  ? WBC 17.7 (H) 09/03/2021  ? HGB 13.3 09/03/2021  ? HCT 41.9 09/03/2021  ? MCV 97.0 09/03/2021  ? PLT 109 (L) 09/03/2021  ?  ?Lab Results  ?Component Value Date  ? CREATININE 3.46 (H) 09/03/2021  ? BUN 68 (H) 09/03/2021  ? NA 143 09/03/2021  ? K 4.8 09/03/2021  ? CL 103 09/03/2021  ? CO2 28 09/03/2021  ?  ?Lab Results  ?Component Value Date  ? ALT 21 09/03/2021  ? AST 45 (H) 09/03/2021  ? ALKPHOS 123 09/03/2021  ?  ? ?Microbiology: ?Recent Results (from the past 240 hour(s))  ?Urine Culture     Status: None  ? Collection Time: 08/28/21 11:38 PM  ? Specimen: In/Out Cath Urine  ?Result Value Ref Range Status  ? Specimen Description IN/OUT CATH URINE  Final  ? Special Requests NONE  Final  ? Culture   Final  ?  NO GROWTH ?Performed at [facility], [place] [address], [place], [place] [address] ?  ? Report Status 08/30/2021 FINAL  Final  ?Blood Culture (routine x 2)     Status: Abnormal  ? Collection Time: 08/28/21 11:45 PM  ? Specimen: BLOOD LEFT FOREARM  ?Result Value Ref Range Status  ? Specimen Description BLOOD LEFT FOREARM  Final  ? Special Requests   Final  ?  BOTTLES DRAWN AEROBIC AND ANAEROBIC Blood Culture adequate volume  ? Culture  Setup Time   Final  ?  GRAM NEGATIVE RODS ?ANAEROBIC BOTTLE ONLY ?CRITICAL RESULT CALLED TO, READ BACK BY AND VERIFIED WITH: PHARM D J.FRIENS ON [id number] AT 0941 BY E.PARRISH ?Performed at [facility], [place] [address] [place], [place] [address] ?  ? Culture  PROTEUS MIRABILIS (A)  Final  ? Report Status 09/02/2021 FINAL  Final  ? Organism ID, Bacteria PROTEUS MIRABILIS  Final  ?    Susceptibility  ? Proteus mirabilis - MIC*  ?  AMPICILLIN <=2 SENSITIVE Sensitive   ?  CEFAZOLIN <=4 SENSITIVE Sensitive   ?  CEFEPIME <=0.12 SENSITIVE Sensitive   ?  CEFTAZIDIME <=1 SENSITIVE Sensitive   ?  CEFTRIAXONE <=0.25 SENSITIVE Sensitive   ?  CIPROFLOXACIN <=0.25 SENSITIVE Sensitive   ?  GENTAMICIN <=1 SENSITIVE Sensitive   ?  IMIPENEM 2 SENSITIVE Sensitive   ?  TRIMETH/SULFA <=20 SENSITIVE Sensitive   ?  AMPICILLIN/SULBACTAM <=2 SENSITIVE Sensitive   ?  PIP/TAZO <=4 SENSITIVE Sensitive   ?  * PROTEUS MIRABILIS  ?Blood Culture ID Panel (Reflexed)     Status: Abnormal  ? Collection Time: 08/28/21 11:45 PM  ?Result Value Ref Range Status  ? Enterococcus faecalis NOT DETECTED NOT DETECTED Final  ? Enterococcus Faecium NOT DETECTED NOT DETECTED Final  ? Listeria monocytogenes NOT DETECTED NOT DETECTED Final  ? Staphylococcus species NOT DETECTED NOT DETECTED Final  ? Staphylococcus aureus (BCID) NOT DETECTED NOT DETECTED Final  ? Staphylococcus epidermidis NOT DETECTED  NOT DETECTED Final  ? Staphylococcus lugdunensis NOT DETECTED NOT DETECTED Final  ? Streptococcus species NOT DETECTED NOT DETECTED Final  ? Streptococcus agalactiae NOT DETECTED NOT DETECTED Final  ? Streptococcus pneumoniae NOT DETECTED NOT DETECTED Final  ? Streptococcus pyogenes NOT DETECTED NOT DETECTED Final  ? A.calcoaceticus-baumannii NOT DETECTED NOT DETECTED Final  ? Bacteroides fragilis NOT DETECTED NOT DETECTED Final  ? Enterobacterales DETECTED (A) NOT DETECTED Final  ?  Comment: Enterobacterales represent a large order of gram negative bacteria, not a single organism. ?CRITICAL RESULT CALLED TO, READ BACK BY AND VERIFIED WITH: ?PHARM D J.FRIENS ON [id number] AT 0941 BY E.PARRISH ?  ? Enterobacter cloacae complex NOT DETECTED NOT DETECTED Final  ? Escherichia coli NOT DETECTED NOT DETECTED Final  ? Klebsiella  aerogenes NOT DETECTED NOT DETECTED Final  ? Klebsiella oxytoca NOT DETECTED NOT DETECTED Final  ? Klebsiella pneumoniae NOT DETECTED NOT DETECTED Final  ? Proteus species DETECTED (A) NOT DETECTED Final  ?  Comment: CRITICAL RESULT CALLED TO, READ BACK BY AND VERIFIED WITH: ?PHARM D J.FRIENS ON [id number] AT 0941 BY E.PARRISH ?  ? Salmonella species NOT DETECTED NOT DETECTED Final  ? Serratia marcescens NOT DETECTED NOT DETECTED Final  ? Haemophilus influenzae NOT DETECTED NOT DETECTED Final  ? Neisseria meningitidis NOT DETECTED NOT DETECTED Final  ? Pseudomonas aeruginosa NOT DETECTED NOT DETECTED Final  ? Stenotrophomonas maltophilia NOT DETECTED NOT DETECTED Final  ? Candida albicans NOT DETECTED NOT DETECTED Final  ? Candida auris NOT DETECTED NOT DETECTED Final  ? Candida glabrata NOT DETECTED NOT DETECTED Final  ? Candida krusei NOT DETECTED NOT DETECTED Final  ? Candida parapsilosis NOT DETECTED NOT DETECTED Final  ? Candida tropicalis NOT DETECTED NOT DETECTED Final  ? Cryptococcus neoformans/gattii NOT DETECTED NOT DETECTED Final  ? CTX-M ESBL NOT DETECTED NOT DETECTED Final  ? Carbapenem resistance IMP NOT DETECTED NOT DETECTED Final  ? Carbapenem resistance KPC NOT DETECTED NOT DETECTED Final  ? Carbapenem resistance NDM NOT DETECTED NOT DETECTED Final  ? Carbapenem resist OXA 48 LIKE NOT DETECTED NOT DETECTED Final  ? Carbapenem resistance VIM NOT DETECTED NOT DETECTED Final  ?  Comment: Performed at [facility], [place] [address] [place], [place] [address]  ?Blood Culture (routine x 2)     Status: None  ? Collection Time: 08/28/21 11:47 PM  ? Specimen: BLOOD  ?Result Value Ref Range Status  ? Specimen Description BLOOD LEFT ANTECUBITAL  Final  ? Special Requests   Final  ?  BOTTLES DRAWN AEROBIC AND ANAEROBIC Blood Culture adequate volume  ? Culture   Final  ?  NO GROWTH 5 DAYS ?Performed at [facility], [place] [address] [place], [place] [address] ?  ? Report Status 09/03/2021 FINAL  Final   ?Resp Panel by RT-PCR (Flu A&B, Covid) Nasopharyngeal Swab     Status: None  ? Collection Time: 08/29/21 12:13 AM  ? Specimen: Nasopharyngeal Swab; Nasopharyngeal(NP) swabs in vial transport medium  ?Result Value Ref Range Status  ? SARS Coronavirus 2 by RT PCR NEGATIVE NEGATIVE Final  ?  Comment: (NOTE) ?SARS-CoV-2 target nucleic acids are NOT DETECTED. ? ?The SARS-CoV-2 RNA is generally detectable in upper respiratory ?specimens during the acute phase of infection. The lowest ?concentration of SARS-CoV-2 viral copies this assay can detect is ?138 copies/mL. A negative result does not preclude SARS-Cov-2 ?infection and should not be used as the sole basis for treatment or ?other patient management decisions. A negative result may  occur with  ?improper specimen collection/handling, submission of specimen other ?than nasopharyngeal swab, presence of viral mutation(s) within the ?areas targeted by this assay, and inadequate number of viral ?copies(<138 copies/mL). A negative result must be combined with ?clinical observations, patient history, and epidemiological ?information. The expected result is Negative. ? ?Fact Sheet for Patients:  ?EntrepreneurPulse.com.au ? ?Fact Sheet for Healthcare Providers:  ?IncredibleEmployment.be ? ?This test is no t yet approved or cleared by the [place] FDA and  ?has been authorized for detection and/or diagnosis of SARS-CoV-2 by ?FDA under an Emergency Use Authorization (EUA). This EUA will remain  ?in effect (meaning this test can be used) for the duration of the ?COVID-19 declaration under Section 564(b)(1) of the Act, 21 ?U.S.C.section 360bbb-3(b)(1), unless the authorization is terminated  ?or revoked sooner.  ? ? ?  ? Influenza A by PCR NEGATIVE NEGATIVE Final  ? Influenza B by PCR NEGATIVE NEGATIVE Final  ?  Comment: (NOTE) ?The Xpert Xpress SARS-CoV-2/FLU/RSV plus assay is intended as an aid ?in the diagnosis of influenza from  Nasopharyngeal swab specimens and ?should not be used as a sole basis for treatment. Nasal washings and ?aspirates are unacceptable for Xpert Xpress SARS-CoV-2/FLU/RSV ?testing. ? ?Fact Sheet for Patients: ?https:/

## 2021-09-03 NOTE — Progress Notes (Signed)
ABG results given to Dr. Silas Flood. Verbal order received to decrease FiO2 but leave peep as is until RT further directed by MD. RT will continue to monitor and be available as needed.  ?

## 2021-09-03 NOTE — Progress Notes (Signed)
Inpatient Diabetes Program Recommendations ? ?AACE/ADA: New Consensus Statement on Inpatient Glycemic Control  ? ?Target Ranges:  Prepandial:   less than 140 mg/dL ?     Peak postprandial:   less than 180 mg/dL (1-2 hours) ?     Critically ill patients:  140 - 180 mg/dL  ? ? Latest Reference Range & Units 09/03/21 07:07 09/03/21 08:42 09/03/21 11:16 09/03/21 11:31  ?Glucose-Capillary 70 - 99 mg/dL 124 (H) 234 (H) 235 (H) 235 (H)  ? ? Latest Reference Range & Units 09/02/21 14:27 09/02/21 18:18 09/02/21 19:11 09/02/21 23:43  ?Glucose-Capillary 70 - 99 mg/dL 126 (H) 114 (H) 117 (H) 123 (H)  ? ? Latest Reference Range & Units 09/02/21 18:22  ?Hemoglobin A1C 4.8 - 5.6 % 5.7 (H)  ? ?Review of Glycemic Control ? ?Diabetes history: No ?Outpatient Diabetes medications: Jardiance 10 mg QAM ?Current orders for Inpatient glycemic control: None ? ?Inpatient Diabetes Program Recommendations:   ? ?Insulin: Please consider ordering CBGs with Novolog correction 0-15 units Q4H. ? ?Thanks, ?Barnie Alderman, RN, MSN, CDE ?Diabetes Coordinator ?Inpatient Diabetes Program ?[phone] (Team Pager from 8am to 5pm) ? ? ? ? ?

## 2021-09-03 NOTE — Progress Notes (Signed)
eLink Physician-Brief Progress Note ?Patient Name: Neale Marzette ?DOB: Apr 25, 1946 ?MRN: [id number] ? ? ?Date of Service ? 09/03/2021  ?HPI/Events of Note ? Patient with poor air movement on auscultation.  ?eICU Interventions ? Portable CXR ordered to assess lung parenchyma.  ? ? ? ?  ? ?Kerry Kass Neesa Knapik ?09/03/2021, 6:27 AM ?

## 2021-09-03 NOTE — Progress Notes (Addendum)
OT Cancellation Note and Discharge ? ?Patient Details ?Name: Ricky Hayden ?MRN: [id number] ?DOB: 21-Apr-1946 ? ? ?Cancelled Treatment:    Reason Eval/Treat Not Completed: Medical issues which prohibited therapy. Pt with recent code, now intubated and sedated. OT will sign off at this time. Please reorder therapy when medically appropriate. ? ?[name] ?09/03/2021, 9:10 AM ? ?Jesse Sans OTR/L ?Acute Rehabilitation Services ?Pager: [phone] ?Office: [phone] ? ?

## 2021-09-03 NOTE — Hospital Course (Addendum)
Ricky Hayden is a 76 y.o. M with chronic systolic HF, Afib, chronic kidney disease and COPD on home O2 PRN and hx ICH s/p evacuation 1995 who presented with hematuria then fever and SOB.  In the ER, required BiPAP.  CT showed kidney stones without hydro.  Urology consulted, no stenting needed.  Admitted and blood cultures with Proteus.  ID on board.  On 5/2 developed increased O2 requirements, CCM consulted and transferred to ICU.  5/12, extubated after diuresing. 5/13, transferred to medical floor. 5/14-5/17, stabilizing.

## 2021-09-04 ENCOUNTER — Inpatient Hospital Stay (HOSPITAL_COMMUNITY): Payer: No Typology Code available for payment source

## 2021-09-04 DIAGNOSIS — G9340 Encephalopathy, unspecified: Secondary | ICD-10-CM

## 2021-09-04 DIAGNOSIS — A419 Sepsis, unspecified organism: Secondary | ICD-10-CM | POA: Diagnosis not present

## 2021-09-04 DIAGNOSIS — J9621 Acute and chronic respiratory failure with hypoxia: Secondary | ICD-10-CM | POA: Diagnosis present

## 2021-09-04 DIAGNOSIS — J9690 Respiratory failure, unspecified, unspecified whether with hypoxia or hypercapnia: Secondary | ICD-10-CM | POA: Diagnosis not present

## 2021-09-04 DIAGNOSIS — B964 Proteus (mirabilis) (morganii) as the cause of diseases classified elsewhere: Secondary | ICD-10-CM | POA: Diagnosis not present

## 2021-09-04 DIAGNOSIS — J9601 Acute respiratory failure with hypoxia: Principal | ICD-10-CM | POA: Diagnosis present

## 2021-09-04 LAB — BASIC METABOLIC PANEL
Anion gap: 7 (ref 5–15)
BUN: 80 mg/dL — ABNORMAL HIGH (ref 8–23)
CO2: 28 mmol/L (ref 22–32)
Calcium: 8.6 mg/dL — ABNORMAL LOW (ref 8.9–10.3)
Chloride: 106 mmol/L (ref 98–111)
Creatinine, Ser: 3.55 mg/dL — ABNORMAL HIGH (ref 0.61–1.24)
GFR, Estimated: 17 mL/min — ABNORMAL LOW (ref >60)
Glucose, Bld: 155 mg/dL — ABNORMAL HIGH (ref 70–99)
Potassium: 4.3 mmol/L (ref 3.5–5.1)
Sodium: 141 mmol/L (ref 135–145)

## 2021-09-04 LAB — CBC
HCT: 37.4 % — ABNORMAL LOW (ref 39.0–52.0)
Hemoglobin: 12.4 g/dL — ABNORMAL LOW (ref 13.0–17.0)
MCH: 31.3 pg (ref 26.0–34.0)
MCHC: 33.2 g/dL (ref 30.0–36.0)
MCV: 94.4 fL (ref 80.0–100.0)
Platelets: 124 10*3/uL — ABNORMAL LOW (ref 150–400)
RBC: 3.96 MIL/uL — ABNORMAL LOW (ref 4.22–5.81)
RDW: 13.5 % (ref 11.5–15.5)
WBC: 13 10*3/uL — ABNORMAL HIGH (ref 4.0–10.5)
nRBC: 0 % (ref 0.0–0.2)

## 2021-09-04 LAB — PHOSPHORUS
Phosphorus: 2.4 mg/dL — ABNORMAL LOW (ref 2.5–4.6)
Phosphorus: 2.5 mg/dL (ref 2.5–4.6)

## 2021-09-04 LAB — GLUCOSE, CAPILLARY
Glucose-Capillary: 129 mg/dL — ABNORMAL HIGH (ref 70–99)
Glucose-Capillary: 137 mg/dL — ABNORMAL HIGH (ref 70–99)
Glucose-Capillary: 138 mg/dL — ABNORMAL HIGH (ref 70–99)
Glucose-Capillary: 162 mg/dL — ABNORMAL HIGH (ref 70–99)
Glucose-Capillary: 178 mg/dL — ABNORMAL HIGH (ref 70–99)
Glucose-Capillary: 197 mg/dL — ABNORMAL HIGH (ref 70–99)

## 2021-09-04 LAB — MAGNESIUM
Magnesium: 3 mg/dL — ABNORMAL HIGH (ref 1.7–2.4)
Magnesium: 3.1 mg/dL — ABNORMAL HIGH (ref 1.7–2.4)

## 2021-09-04 LAB — POTASSIUM: Potassium: 4.3 mmol/L (ref 3.5–5.1)

## 2021-09-04 MED ORDER — SODIUM CHLORIDE 0.9% FLUSH: 10.0000 mL | INTRAVENOUS | Status: DC | PRN

## 2021-09-04 MED ORDER — ORAL CARE MOUTH RINSE
15.0000 mL | OROMUCOSAL | Status: DC
  Administered 2021-09-04 – 2021-09-12 (×83): 15 mL via OROMUCOSAL

## 2021-09-04 MED ORDER — SODIUM CHLORIDE 0.9% FLUSH
10.0000 mL | Freq: Two times a day (BID) | INTRAVENOUS | Status: DC
  Administered 2021-09-04 (×2): 10 mL
  Administered 2021-09-05: 20 mL
  Administered 2021-09-06: 10 mL
  Administered 2021-09-06: 20 mL
  Administered 2021-09-07 – 2021-09-11 (×9): 10 mL

## 2021-09-04 MED ORDER — CHLORHEXIDINE GLUCONATE 0.12% ORAL RINSE (MEDLINE KIT)
15.0000 mL | Freq: Two times a day (BID) | OROMUCOSAL | Status: DC
  Administered 2021-09-04 – 2021-09-12 (×17): 15 mL via OROMUCOSAL

## 2021-09-04 MED ORDER — LACTATED RINGERS IV BOLUS
500.0000 mL | Freq: Once | INTRAVENOUS | Status: AC
  Administered 2021-09-04: 500 mL via INTRAVENOUS

## 2021-09-04 MED ORDER — SODIUM CHLORIDE 0.9 % IV SOLN
3.0000 g | Freq: Two times a day (BID) | INTRAVENOUS | Status: DC
  Administered 2021-09-04 – 2021-09-08 (×8): 3 g via INTRAVENOUS
  Filled 2021-09-04 (×8): qty 8

## 2021-09-04 MED ORDER — LORAZEPAM 2 MG/ML IJ SOLN
INTRAMUSCULAR | Status: AC
  Administered 2021-09-04: 4 mg via INTRAVENOUS
  Filled 2021-09-04: qty 2

## 2021-09-04 MED ORDER — LORAZEPAM 2 MG/ML IJ SOLN: 4.0000 mg | Freq: Once | INTRAMUSCULAR | Status: AC

## 2021-09-04 MED ORDER — VITAL 1.5 CAL PO LIQD
1000.0000 mL | ORAL | Status: DC
  Administered 2021-09-04 – 2021-09-12 (×8): 1000 mL
  Filled 2021-09-04 (×2): qty 1000

## 2021-09-04 MED ORDER — LORAZEPAM 2 MG/ML IJ SOLN: 2.0000 mg | INTRAMUSCULAR | Status: DC | PRN

## 2021-09-04 MED ORDER — PROSOURCE TF PO LIQD
45.0000 mL | Freq: Two times a day (BID) | ORAL | Status: DC
  Administered 2021-09-04 – 2021-09-12 (×17): 45 mL
  Filled 2021-09-04 (×18): qty 45

## 2021-09-04 MED FILL — Medication: Qty: 1 | Status: AC

## 2021-09-04 NOTE — Procedures (Signed)
Intubation Procedure Note ? ?[name]  ?[id number]  ?[birth year] ? ?Date:09/04/21  ?Time:10:12 AM  ? ?Provider Performing:Shakemia Madera R Mahathi Pokorney  ? ? ?Procedure: Intubation (51025) ? ?Indication(s) ?Respiratory Failure ? ?Consent ?Unable to obtain consent due to emergent nature of procedure. ? ? ?Anesthesia ?Etomidate, Fentanyl, and Rocuronium ? ? ?Time Out ?Verified patient identification, verified procedure, site/side was marked, verified correct patient position, special equipment/implants available, medications/allergies/relevant history reviewed, required imaging and test results available. ? ? ?Sterile Technique ?Usual hand hygeine, masks, and gloves were used ? ? ?Procedure Description ?Patient positioned in bed supine.  Sedation given as noted above.  Patient was intubated with endotracheal tube using Glidescope.  View was Grade 2 only posterior commissure .  Number of attempts was 1.  Colorimetric CO2 detector was consistent with tracheal placement. ? ? ?Complications/Tolerance ?None; patient tolerated the procedure well. ?Chest X-ray is ordered to verify placement. ? ? ?EBL ?none ? ? ?Specimen(s) ?None  ?

## 2021-09-04 NOTE — Progress Notes (Signed)
IP rehab admissions - patient on the vent and sedated.  I did touch base with the family.  Not appropriate for CIR at this time.  Once he is off the vent, awake and participating with therapies, we can then consider potential CIR admission.  We will need insurance auth once patient is closer to medically ready.  Call for questions.  [phone] ?

## 2021-09-04 NOTE — Progress Notes (Signed)
PT Cancellation Note ? ?Patient Details ?Name: Ricky Hayden ?MRN: [id number] ?DOB: 1945-11-03 ? ? ?Cancelled Treatment:    Reason Eval/Treat Not Completed: (P) Patient not medically ready Pt intubated and sedated. RN request PT sign off. Please reorder PT when appropriate. ? ?Dajuana Palen B. Migdalia Dk PT, DPT ?Acute Rehabilitation Services ?Please use secure chat or  ?Call Office [phone] ? ? ? ?[place] ?09/04/2021, 11:09 AM ? ? ?

## 2021-09-04 NOTE — Procedures (Signed)
Patient Name: Ricky Hayden  ?MRN: [id number]  ?Epilepsy Attending: Lora Havens  ?Referring Physician/Provider: Lanier Clam, MD ?Date: 09/04/2021 ?Duration: 34.45 mins ? ?Patient history: 76yo M with ams. EEG to evaluate for seizure ? ?Level of alertness: lethargic  ? ?AEDs during EEG study: Phenobarb ? ?Technical aspects: This EEG study was done with scalp electrodes positioned according to the 10-20 International system of electrode placement. Electrical activity was acquired at a sampling rate of 500Hz  and reviewed with a high frequency filter of 70Hz  and a low frequency filter of 1Hz . EEG data were recorded continuously and digitally stored.  ? ?Description: EEG showed continuous generalized 3 to 7 Hz theta-delta slowing. Hyperventilation and photic stimulation were not performed.    ? ?ABNORMALITY ?- Continuous slow, generalized ? ?IMPRESSION: ?This study is suggestive of moderate to severe diffuse encephalopathy, nonspecific etiology. No seizures or epileptiform discharges were seen throughout the recording. ? ?Lora Havens  ? ?

## 2021-09-04 NOTE — Progress Notes (Signed)
? ?NAME:  Ricky Hayden, MRN:  [id number], DOB:  Dec 24, 1945, LOS: 6 ?ADMISSION DATE:  08/28/2021, CONSULTATION DATE:  5/2 ?REFERRING MD:  Dr. Avon Gully, CHIEF COMPLAINT:  Acute respiratory failure  ? ?History of Present Illness:  ?History obtained from chart review and daughter [name] at bedside. ? ?Ricky Hayden is a 76 yo gentleman w/ pertinent PMH of chronic HFrEF with ICD, a-fib, CKD stage IIIb, COPD who presented to [facility] on 5/2 w/ hematuria x 3 weeks. The week prior to admission he was started on Bactrim for urine cultures showing proteus mirabilis. He began having worsening fever and SOB and was brought to [facility] ED on 4/27.  ? ?At admission he was initially started on BiPAP for SOB. Fever 103 F. Started on empiric abx. CT abdomen showing nonobstructive nephrolithiasis. Urology consulted with no indication for procedure.  Further work up showing sepsis from UTI w/ pyelonephritis. Blood cultures were positive for proteus mirabilis. On Ancef with ID managing antibiotics.  ? ?On 5/2 he began having decreased LOC and increased O2 requirements. Per his RN this began around 11AM and they waited for 2 ABG results before he was placed on BiPAP. Once he was on Bipap he had improvement in saturations. There was concern this was due to volume overload but concerning to give lasix with worsening kidney function.  Abg 7.3/ 61/ 61/ 30. PCCM consulted for hypoxia evaluation and management. ? ?Pertinent  Medical History  ? 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 ? ?Significant Hospital Events: ?Including procedures, antibiotic start and stop dates in addition to other pertinent events   ?4/27: admitted to [facility] for sepsis 2/2 UTI, started ceftriaxone and azithro ?4/27-5/1 on ceftriaxone ?5/2:  antibiotics deescalated to cefazolin. PCCM consult for hypoxia. Placed on BIPAP ?5/3: Bipap  all night, worsening mental status and hypoxemia, intubated, short PEA arrest after intubation, hypotensive, central line placed, CXR in AM clear, CXR after intuabntion diffuse R sided infiltrates concerning for aspiration vs HAP ?5/4 stable pressors, o2 weaned, Cr elevated, Uop diminished but not oliguric,  some flexure posturing, EEG ordered ? ?Interim History / Subjective:  ?stable pressors, o2 weaned, Cr elevated, Uop diminished but not oliguric, some flexure posturing, EEG ordered ? ?Objective   ?Blood pressure 105/61, pulse 82, temperature (!) 100.7 ?F (38.2 ?C), temperature source Oral, resp. rate (!) 23, height 5\' 7"  (1.702 m), weight 107.2 kg, SpO2 98 %. ?CVP:  [4 mmHg-8 mmHg] 4 mmHg  ?Vent Mode: PRVC ?FiO2 (%):  [60 %-100 %] 60 % ?Set Rate:  [22 bmp] 22 bmp ?Vt Set:  [520 mL] 520 mL ?PEEP:  [12 cmH20] 12 cmH20 ?Plateau Pressure:  [20 cmH20-22 cmH20] 22 cmH20  ? ?Intake/Output Summary (Last 24 hours) at 09/04/2021 1013 ?Last data filed at 09/04/2021 8341 ?Gross per 24 hour  ?Intake 2450.46 ml  ?Output 725 ml  ?Net 1725.46 ml  ? ? ?Filed Weights  ? 08/29/21 0625  ?Weight: 107.2 kg  ? ? ?Examination: ?General: ill appearing man, intubated ?HENT: [place]/AT, eyes anicteric ?Lungs: corase on right, ventilated sounds ?Cardiovascular: S1S2, RRR ?Abdomen: obese, soft, NT ?Extremities: minimal peripheral edema, no cyanosis ?Neuro: barely arousal with sternal rub,  ?Derm: warm, dry, no diffuse rashes. ? ? ?Resolved Hospital Problem list   ? ? ?Assessment & Plan:  ? ?Acute metabolic encephalopathy- not explained by hypercapnia. Suppose related to severe sepsis, now intubated  partly due to worsening encephalopathy ?-RASS 0, -1, fentanyl gtt, minimize sedation ? ?Hx of brain surgery, on seizure prophylaxis ?-phenobarb level ok, continue ?-STAT spot EEG given some flexure posturing ? ?Shock: septic ?-co-ox PRN ?-monitor CVP ?-LR 500 cc bolus ? ?Acute respiratory failure with hypoxia and hypercapnia- acute worsening 5/3 with CXR  concerning for new R pna, aspiration vs HAP ?Chronic severe COPD- does not appear acutely exacerbated ?-PRVC, VAP bundle, driving pressure ~86 ?-cefepime and linezolid (5/4 - )due to concern for HAP vs aspiration (prev cefazolin for proteus UTI) ? ?Chronic HFrEF: LVEF 40-45% ?-BNP normal, mildly elevated ?-hold coreg and hydralazine ?-holding PTA jardiance, Entresto  ? ?AKI on chronic kidney disease stage IIIb ?-renally dose meds, avoid nephrotoxic meds ?-strict I/Os ?-monitor ?-hold Entresto ?-NE for MAP > 65 ? ?Sepsis due to Proteus pyelonephritis and bacteremia and HAP ?-appreciate ID's recommendations ?-As above ? ?A-fib on Eliquis  ?-con't eliquis and amiodarone ? ?Acute anemia likely due to acute illness ?-monitor ?-transfuse for Hb<7 or hemodynamically significant bleeding ? ?Acute thrombocytopenia ?-monitor ? ?HLD ?-pravastatin ? ? ? ? ?Best Practice (right click and "Reselect all SmartList Selections" daily)  ? ?Diet/type: tubefeeds ?DVT prophylaxis: DOAC ?GI prophylaxis: PPI ?Lines: N/A ?Foley:  Yes, and it is still needed ?Code Status:  full code ?Last date of multidisciplinary goals of care discussion [5/3- daughter [name] updated on intubation and code, full scope] ? ? ?Labs   ?CBC: ?Recent Labs  ?Lab 08/28/21 ?5784 08/28/21 ?2358 08/29/21 ?6962 08/30/21 ?9528 09/01/21 ?4132 09/02/21 ?4401 09/03/21 ?0301 09/03/21 ?0272 09/04/21 ?0206  ?WBC 7.6  --  8.5   < > 11.3* 8.7 9.9 17.7* 13.0*  ?NEUTROABS 6.4  --  7.4  --   --   --   --  12.9*  --   ?HGB 14.6   < > 13.9   < > 12.5* 12.5* 12.9* 13.3 12.4*  ?HCT 44.5   < > 42.4   < > 38.3* 37.0* 38.9* 41.9 37.4*  ?MCV 95.3  --  95.3   < > 95.3 94.6 94.9 97.0 94.4  ?PLT 114*  --  97*   < > 88* 74* 93* 109* 124*  ? < > = values in this interval not displayed.  ? ? ? ?Basic Metabolic Panel: ?Recent Labs  ?Lab 08/30/21 ?5366 08/31/21 ?4403 09/01/21 ?0308 09/02/21 ?4742 09/03/21 ?0301 09/03/21 ?5956 09/04/21 ?0206  ?NA 140   < > 137 138 140 143 141  ?K 3.8   < > 4.3 4.6  4.6 4.8 4.3  ?CL 106   < > 102 103 104 103 106  ?CO2 27   < > 26 27 28 28 28   ?GLUCOSE 115*   < > 160* 127* 120* 308* 155*  ?BUN 30*   < > 44* 54* 65* 68* 80*  ?CREATININE 2.23*   < > 3.25* 3.26* 3.51* 3.46* 3.55*  ?CALCIUM 8.7*   < > 8.4* 8.8* 9.0 8.8* 8.6*  ?MG  --   --   --   --   --  3.2*  --   ?PHOS 4.1  --   --   --   --  5.6*  --   ? < > = values in this interval not displayed.  ? ? ?GFR: ?Estimated Creatinine Clearance: 20.3 mL/min (A) (by C-G formula based on SCr of 3.55 mg/dL (H)). ?Recent Labs  ?Lab 08/29/21 ?0145 08/29/21 ?3875 09/02/21 ?0238 09/02/21 ?6433 09/03/21 ?0301 09/03/21 ?2951 09/03/21 ?1206 09/04/21 ?0206  ?WBC  --    < >  8.7  --  9.9 17.7*  --  13.0*  ?LATICACIDVEN 1.0  --   --  0.9  --  2.4* 2.0*  --   ? < > = values in this interval not displayed.  ? ? ? ?Liver Function Tests: ?Recent Labs  ?Lab 08/28/21 ?1287 08/29/21 ?8676 08/30/21 ?0152 09/02/21 ?1840 09/03/21 ?0850  ?AST 21 21  --  36 45*  ?ALT 20 18  --  28 21  ?ALKPHOS 62 55  --  116 123  ?BILITOT 0.7 0.7  --  0.5 0.5  ?PROT 7.5 6.9  --  6.8 6.6  ?ALBUMIN 3.6 3.2* 3.0* 2.5* 2.4*  ? ? ?No results for input(s): LIPASE, AMYLASE in the last 168 hours. ?Recent Labs  ?Lab 09/02/21 ?1840  ?AMMONIA 45*  ? ? ? ?ABG ?   ?Component Value Date/Time  ? PHART 7.39 09/03/2021 1148  ? PCO2ART 50 (H) 09/03/2021 1148  ? PO2ART 171 (H) 09/03/2021 1148  ? HCO3 30.3 (H) 09/03/2021 1148  ? TCO2 29 08/29/2021 0102  ? O2SAT 73.5 09/03/2021 1206  ? ?  ? ?Coagulation Profile: ?Recent Labs  ?Lab 08/28/21 ?2347 09/03/21 ?0850  ?INR 1.5* 1.3*  ? ? ? ?Cardiac Enzymes: ?No results for input(s): CKTOTAL, CKMB, CKMBINDEX, TROPONINI in the last 168 hours. ? ?HbA1C: ?Hemoglobin A1C  ?Date/Time Value Ref Range Status  ?10/05/2012 03:13 PM 5.4  Final  ? ?Hgb A1c MFr Bld  ?Date/Time Value Ref Range Status  ?09/02/2021 06:22 PM 5.7 (H) 4.8 - 5.6 % Final  ?  Comment:  ?  (NOTE) ?Pre diabetes:          5.7%-6.4% ? ?Diabetes:              >6.4% ? ?Glycemic control for    <7.0% ?adults with diabetes ?  ?02/06/2008 11:25 PM   Final  ? 5.4 ?(NOTE)   The ADA recommends the following therapeutic goal for glycemic   control related to Hgb A1C measurement:   Goal of Therapy:   <

## 2021-09-04 NOTE — Progress Notes (Signed)
Initial Nutrition Assessment ? ?DOCUMENTATION CODES:  ? ?Not applicable ? ?INTERVENTION:  ? ?Initiate tube feeds via OG tube: ?- Start Vital 1.5 @ 20 ml/hr and advance by 10 ml q 6 hours to goal rate of 55 ml/hr (1320 ml/day) ?- ProSource TF 45 ml BID ? ?Tube feeding regimen at goal rate provides 2060 kcal, 111 grams of protein, and 1008 ml of H2O.  ? ?NUTRITION DIAGNOSIS:  ? ?Inadequate oral intake related to inability to eat as evidenced by NPO status. ? ?GOAL:  ? ?Patient will meet greater than or equal to 90% of their needs ? ?MONITOR:  ? ?Vent status, Labs, Weight trends, TF tolerance, Skin, I & O's ? ?REASON FOR ASSESSMENT:  ? ?Ventilator, Consult ?Enteral/tube feeding initiation and management, Assessment of nutrition requirement/status ? ?ASSESSMENT:  ? ?76 year old male who presented to the ED on 4/27 with SOB. PMH of CHF with ICD, atrial fibrillation, CKD stage IIIb, COPD, seizures, CAD, GERD, HLD, HTN, brain surgery. Pt admitted with sepsis secondary to UTI, non-obstructing nephrolithiasis/pyelonephritis, bacteremia. ? ?05/02 - rapid response due to decreased LOC and increased O2 needs ?05/03 - intubated, short PEA arrest after intubation ? ?Discussed pt with RN and during ICU rounds. Pt with OG tube in stomach per abdominal x-ray yesterday. OG tube is currently clamped. Abdominal x-ray yesterday also showed small bowel distention. RN confirms pt's abdomen is distended. Will start tube feeds at trickle rate and slowly advanced to goal. ? ?Spoke with pt's daughter at bedside. She states that pt's last "good meal" was on Saturday 4/29 for lunch. He has eaten a little bit (Ensure, pudding) since then but not a real meal. Pt has not had anything to eat since Tuesday morning per pt's daughter. ? ?Pt's daughter reports that pt has a fair appetite at baseline but appetite has been fluctuating due to antibiotics for UTI as well as CKD. Pt typically eats 3 meals daily. Breakfast includes an Ensure supplement or an  Ensure with cheerios. Lunch typically includes ice cream and a diet Coke. Dinner is typically a "full meal." ? ?Pt's daughter states that pt weighs himself daily. His UBW is 232-233 lbs. He has not lost any weight recently. Reviewed weight history in chart. Weight stable between 105-107 kg over the last 15 months. Last weight is from 08/29/21. Recommend obtaining updated weight. ? ?Admit weight: 107.2 kg ? ?Pt with mild pitting edema to BUE and BLE. ? ?Meal Completion: 100% x 1 meal on 4/30 ? ?Patient is currently intubated on ventilator support ?MV: 11.3 L/min ?Temp (24hrs), Avg:99.7 ?F (37.6 ?C), Min:98.5 ?F (36.9 ?C), Max:100.7 ?F (38.2 ?C) ?BP (cuff): 103/58 ?MAP (cuff): 72 ? ?Drips: ?Fentanyl: off this AM for wake-up assessment ?Levophed ? ?Medications reviewed and include: colace, SSI q 4 hours, protonix, phenobarbital, miralax, IV abx ? ?Labs reviewed: BUN 80, creatinine 3.55, phosphorus 5.6 on 5/03, magnesium 3.2 on 5/03, WBC 13.0, platelets 124 ?CBG's: 129-186 x 24 hours ? ?UOP: 725 ml x 24 hours ?NGT: 100 ml x 12 hours ?I/O's: -1/1 L since admit ? ?NUTRITION - FOCUSED PHYSICAL EXAM: ? ?Flowsheet Row Most Recent Value  ?Orbital Region No depletion  ?Upper Arm Region No depletion  ?Thoracic and Lumbar Region No depletion  ?Buccal Region Unable to assess  ?Temple Region No depletion  ?Clavicle Bone Region Mild depletion  ?Clavicle and Acromion Bone Region Mild depletion  ?Scapular Bone Region No depletion  ?Dorsal Hand No depletion  ?Patellar Region No depletion  ?Anterior Thigh Region Mild depletion  ?  Posterior Calf Region No depletion  ?Edema (RD Assessment) Mild  [generalized]  ?Hair Reviewed  ?Eyes Reviewed  ?Mouth Reviewed  ?Skin Reviewed  ?Nails Reviewed  ? ?  ? ? ?Diet Order:   ?Diet Order   ? ?       ?  Diet NPO time specified  Diet effective now       ?  ? ?  ?  ? ?  ? ? ?EDUCATION NEEDS:  ? ?Education needs have been addressed ? ?Skin:  Skin Assessment: ?Skin Integrity Issues: ?Unstageable:  nose ? ?Last BM:  no documented BM ? ?Height:  ? ?Ht Readings from Last 1 Encounters:  ?08/29/21 5\' 7"  (1.702 m)  ? ? ?Weight:  ? ?Wt Readings from Last 1 Encounters:  ?08/29/21 107.2 kg  ? ? ?Ideal Body Weight:  67.3 kg ? ?BMI:  Body mass index is 37.03 kg/m?. ? ?Estimated Nutritional Needs:  ? ?Kcal:  2000-2200 ? ?Protein:  110-130 grams ? ?Fluid:  >/= 2.0 L ? ? ? ?Gustavus Bryant, MS, RD, LDN ?Inpatient Clinical Dietitian ?Please see AMiON for contact information. ? ?

## 2021-09-04 NOTE — TOC Progression Note (Addendum)
Transition of Care (TOC) - Initial/Assessment Note  ? ? ?Patient Details  ?Name: Ricky Hayden ?MRN: [id number] ?Date of Birth: Oct 02, 1945 ? ?Transition of Care (TOC) CM/SW Contact:    ?Paulene Floor Chisom Muntean, LCSWA ?Phone Number: ?09/04/2021, 11:09 AM ? ?Clinical Narrative:                 ?Patient remains intubated.  ? ?TOC continuing to follow to assist with d/c planning once medically stable. ? ?     ? ?  ?  ? ? ?Patient Goals and CMS Choice ?  ?  ?  ? ?Expected Discharge Plan and Services ?  ?  ?  ?  ?  ?                ?  ?  ?  ?  ?  ?  ?  ?  ?  ?  ? ?Prior Living Arrangements/Services ?  ?  ?  ?       ?  ?  ?  ?  ? ?Activities of Daily Living ?Home Assistive Devices/Equipment: None ?ADL Screening (condition at time of admission) ?Patient's cognitive ability adequate to safely complete daily activities?: Yes ?Is the patient deaf or have difficulty hearing?: No ?Does the patient have difficulty seeing, even when wearing glasses/contacts?: No ?Does the patient have difficulty concentrating, remembering, or making decisions?: No ?Patient able to express need for assistance with ADLs?: Yes ?Does the patient have difficulty dressing or bathing?: No ?Independently performs ADLs?: Yes (appropriate for developmental age) ?Does the patient have difficulty walking or climbing stairs?: No ?Weakness of Legs: None ?Weakness of Arms/Hands: None ? ?Permission Sought/Granted ?  ?  ?   ?   ?   ?   ? ?Emotional Assessment ?  ?  ?  ?  ?  ?  ? ?Admission diagnosis:  Encephalopathy [G93.40] ?Acute respiratory failure with hypoxia ([place]) [J96.01] ?Sepsis ([place]) [A41.9] ?Urinary tract infection with hematuria, site unspecified [N39.0, R31.9] ?Patient Active Problem List  ? Diagnosis Date Noted  ? Acute respiratory failure with hypoxia ([place])   ? Shock ([place])   ? Proteus (mirabilis) ([place]) as the cause of diseases classified elsewhere 09/02/2021  ? Sepsis ([place]) 08/29/2021  ? UTI (urinary tract infection) 08/29/2021  ? ARF (acute renal failure)  ([place]) 08/29/2021  ? Presence of biventricular cardiac pacemaker 05/15/2020  ? PVC (premature ventricular contraction) 12/11/2019  ? NICM (nonischemic cardiomyopathy) ([place]) 12/11/2019  ? Chronic CHF (congestive heart failure) ([place]) 12/11/2019  ? Acute hypoxemic respiratory failure due to COVID-19 [facility]) 08/12/2019  ? CKD (chronic kidney disease) stage 3, GFR 30-59 ml/min (H[facility]) 08/12/2019  ? COPD with acute exacerbation ([place]) 08/12/2019  ? Hyperkalemia   ? Acute renal injury ([place]) 10/21/2018  ? Respiratory tract congestion with cough 06/18/2015  ? Chronic combined systolic and diastolic CHF, NYHA class 2 ([place]) 06/10/2015  ? Leg swelling 07/16/2014  ? Preventative health care 03/07/2014  ? COPD (chronic obstructive pulmonary disease) ([place]) 09/11/2013  ? BPPV (benign paroxysmal positional vertigo) 10/05/2012  ? BPH (benign prostatic hyperplasia) 01/10/2008  ? Gout 04/22/2006  ? ERECTILE DYSFUNCTION 04/22/2006  ? 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 Hyperlipidemia 03/15/2006  ? Essential hypertension 03/15/2006  ? GERD 03/15/2006  ? HIATAL HERNIA 03/15/2006  ? OSTEOARTHRITIS 03/15/2006  ? 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 ?PCP:  [facility] ?Pharmacy:   ?[name] ?[phone] ?[place] [address] ?Phone: [phone]  Fax: [phone] ? ?[facility] ?[address] ?[place] [address] ?Phone: [phone] Fax: [phone] ? ? ? ? ?Social Determinants of Health (SDOH) Interventions ?  ? ?Readmission Risk Interventions ? ?  08/18/2019  ? 11:08 AM 08/17/2019  ?  3:41 PM  ?Readmission Risk Prevention Plan  ?Transportation Screening Complete Complete  ?PCP or Specialist Appt within 5-7 Days  Complete  ?PCP or Specialist Appt within 3-5 Days Complete   ?Home Care Screening  Complete  ?Medication Review (RN CM)  Complete  ?[place] or Home Care Consult Complete   ?Social Work Consult  for [place] Planning/Counseling Complete   ?Palliative Care Screening Complete   ?Medication Review Press photographer) Complete   ? ? ? ?

## 2021-09-04 NOTE — Progress Notes (Signed)
EEG complete - results pending 

## 2021-09-04 NOTE — Progress Notes (Signed)
?  [facility] ? ? ?Reason for visit: Follow up on respiratory failure ? ?Interval History: remains intubated; WBC 13, Tmax 100.7; family at bedside.  Respiratory culture sent.   ?Right lung airspace disease ? ? ?Physical Exam: ?Constitutional:  ?Vitals:  ? 09/04/21 1315 09/04/21 1330  ?BP: (!) 107/59 110/61  ?Pulse: 79 78  ?Resp: (!) 22 (!) 24  ?Temp:    ?SpO2: 96% 96%  ? patient appears in NAD ?HENT: +ET ?Respiratory: respiratory effort on vent ?Cardiovascular: RRR ?GI: soft, nt, nd ? ?Review of Systems: ?Unable to be assessed due to patient factors ? ?Lab Results  ?Component Value Date  ? WBC 13.0 (H) 09/04/2021  ? HGB 12.4 (L) 09/04/2021  ? HCT 37.4 (L) 09/04/2021  ? MCV 94.4 09/04/2021  ? PLT 124 (L) 09/04/2021  ?  ?Lab Results  ?Component Value Date  ? CREATININE 3.55 (H) 09/04/2021  ? BUN 80 (H) 09/04/2021  ? NA 141 09/04/2021  ? K 4.3 09/04/2021  ? CL 106 09/04/2021  ? CO2 28 09/04/2021  ?  ?Lab Results  ?Component Value Date  ? ALT 21 09/03/2021  ? AST 45 (H) 09/03/2021  ? ALKPHOS 123 09/03/2021  ?  ? ?Microbiology: ?Recent Results (from the past 240 hour(s))  ?Urine Culture     Status: None  ? Collection Time: 08/28/21 11:38 PM  ? Specimen: In/Out Cath Urine  ?Result Value Ref Range Status  ? Specimen Description IN/OUT CATH URINE  Final  ? Special Requests NONE  Final  ? Culture   Final  ?  NO GROWTH ?Performed at [facility], [place] [address] [place], [place] [address] ?  ? Report Status 08/30/2021 FINAL  Final  ?Blood Culture (routine x 2)     Status: Abnormal  ? Collection Time: 08/28/21 11:45 PM  ? Specimen: BLOOD LEFT FOREARM  ?Result Value Ref Range Status  ? Specimen Description BLOOD LEFT FOREARM  Final  ? Special Requests   Final  ?  BOTTLES DRAWN AEROBIC AND ANAEROBIC Blood Culture adequate volume  ? Culture  Setup Time   Final  ?  GRAM NEGATIVE RODS ?ANAEROBIC BOTTLE ONLY ?CRITICAL RESULT CALLED TO, READ BACK BY AND VERIFIED WITH: PHARM D J.FRIENS ON [id number]  AT 0941 BY E.PARRISH ?Performed at [facility], [place] [address] [place], [place] [address] ?  ? Culture PROTEUS MIRABILIS (A)  Final  ? Report Status 09/02/2021 FINAL  Final  ? Organism ID, Bacteria PROTEUS MIRABILIS  Final  ?    Susceptibility  ? Proteus mirabilis - MIC*  ?  AMPICILLIN <=2 SENSITIVE Sensitive   ?  CEFAZOLIN <=4 SENSITIVE Sensitive   ?  CEFEPIME <=0.12 SENSITIVE Sensitive   ?  CEFTAZIDIME <=1 SENSITIVE Sensitive   ?  CEFTRIAXONE <=0.25 SENSITIVE Sensitive   ?  CIPROFLOXACIN <=0.25 SENSITIVE Sensitive   ?  GENTAMICIN <=1 SENSITIVE Sensitive   ?  IMIPENEM 2 SENSITIVE Sensitive   ?  TRIMETH/SULFA <=20 SENSITIVE Sensitive   ?  AMPICILLIN/SULBACTAM <=2 SENSITIVE Sensitive   ?  PIP/TAZO <=4 SENSITIVE Sensitive   ?  * PROTEUS MIRABILIS  ?Blood Culture ID Panel (Reflexed)     Status: Abnormal  ? Collection Time: 08/28/21 11:45 PM  ?Result Value Ref Range Status  ? Enterococcus faecalis NOT DETECTED NOT DETECTED Final  ? Enterococcus Faecium NOT DETECTED NOT DETECTED Final  ? Listeria monocytogenes NOT DETECTED NOT DETECTED Final  ? Staphylococcus species NOT DETECTED NOT DETECTED Final  ?  Staphylococcus aureus (BCID) NOT DETECTED NOT DETECTED Final  ? Staphylococcus epidermidis NOT DETECTED NOT DETECTED Final  ? Staphylococcus lugdunensis NOT DETECTED NOT DETECTED Final  ? Streptococcus species NOT DETECTED NOT DETECTED Final  ? Streptococcus agalactiae NOT DETECTED NOT DETECTED Final  ? Streptococcus pneumoniae NOT DETECTED NOT DETECTED Final  ? Streptococcus pyogenes NOT DETECTED NOT DETECTED Final  ? A.calcoaceticus-baumannii NOT DETECTED NOT DETECTED Final  ? Bacteroides fragilis NOT DETECTED NOT DETECTED Final  ? Enterobacterales DETECTED (A) NOT DETECTED Final  ?  Comment: Enterobacterales represent a large order of gram negative bacteria, not a single organism. ?CRITICAL RESULT CALLED TO, READ BACK BY AND VERIFIED WITH: ?PHARM D J.FRIENS ON [id number] AT 0941 BY E.PARRISH ?  ? Enterobacter  cloacae complex NOT DETECTED NOT DETECTED Final  ? Escherichia coli NOT DETECTED NOT DETECTED Final  ? Klebsiella aerogenes NOT DETECTED NOT DETECTED Final  ? Klebsiella oxytoca NOT DETECTED NOT DETECTED Final  ? Klebsiella pneumoniae NOT DETECTED NOT DETECTED Final  ? Proteus species DETECTED (A) NOT DETECTED Final  ?  Comment: CRITICAL RESULT CALLED TO, READ BACK BY AND VERIFIED WITH: ?PHARM D J.FRIENS ON [phone] AT 0941 BY E.PARRISH ?  ? Salmonella species NOT DETECTED NOT DETECTED Final  ? Serratia marcescens NOT DETECTED NOT DETECTED Final  ? Haemophilus influenzae NOT DETECTED NOT DETECTED Final  ? Neisseria meningitidis NOT DETECTED NOT DETECTED Final  ? Pseudomonas aeruginosa NOT DETECTED NOT DETECTED Final  ? Stenotrophomonas maltophilia NOT DETECTED NOT DETECTED Final  ? Candida albicans NOT DETECTED NOT DETECTED Final  ? Candida auris NOT DETECTED NOT DETECTED Final  ? Candida glabrata NOT DETECTED NOT DETECTED Final  ? Candida krusei NOT DETECTED NOT DETECTED Final  ? Candida parapsilosis NOT DETECTED NOT DETECTED Final  ? Candida tropicalis NOT DETECTED NOT DETECTED Final  ? Cryptococcus neoformans/gattii NOT DETECTED NOT DETECTED Final  ? CTX-M ESBL NOT DETECTED NOT DETECTED Final  ? Carbapenem resistance IMP NOT DETECTED NOT DETECTED Final  ? Carbapenem resistance KPC NOT DETECTED NOT DETECTED Final  ? Carbapenem resistance NDM NOT DETECTED NOT DETECTED Final  ? Carbapenem resist OXA 48 LIKE NOT DETECTED NOT DETECTED Final  ? Carbapenem resistance VIM NOT DETECTED NOT DETECTED Final  ?  Comment: Performed at [facility], [place] [address], [place], [place] [address]  ?Blood Culture (routine x 2)     Status: None  ? Collection Time: 08/28/21 11:47 PM  ? Specimen: BLOOD  ?Result Value Ref Range Status  ? Specimen Description BLOOD LEFT ANTECUBITAL  Final  ? Special Requests   Final  ?  BOTTLES DRAWN AEROBIC AND ANAEROBIC Blood Culture adequate volume  ? Culture   Final  ?  NO GROWTH 5  DAYS ?Performed at [facility], [place] [address] [place], [place] [address] ?  ? Report Status 09/03/2021 FINAL  Final  ?Resp Panel by RT-PCR (Flu A&B, Covid) Nasopharyngeal Swab     Status: None  ? Collection Time: 08/29/21 12:13 AM  ? Specimen: Nasopharyngeal Swab; Nasopharyngeal(NP) swabs in vial transport medium  ?Result Value Ref Range Status  ? SARS Coronavirus 2 by RT PCR NEGATIVE NEGATIVE Final  ?  Comment: (NOTE) ?SARS-CoV-2 target nucleic acids are NOT DETECTED. ? ?The SARS-CoV-2 RNA is generally detectable in upper respiratory ?specimens during the acute phase of infection. The lowest ?concentration of SARS-CoV-2 viral copies this assay can detect is ?138 copies/mL. A negative result does not preclude SARS-Cov-2 ?infection and should not be used as  the sole basis for treatment or ?other patient management decisions. A negative result may occur with  ?improper specimen collection/handling, submission of specimen other ?than nasopharyngeal swab, presence of viral mutation(s) within the ?areas targeted by this assay, and inadequate number of viral ?copies(<138 copies/mL). A negative result must be combined with ?clinical observations, patient history, and epidemiological ?information. The expected result is Negative. ? ?Fact Sheet for Patients:  ?EntrepreneurPulse.com.au ? ?Fact Sheet for Healthcare Providers:  ?IncredibleEmployment.be ? ?This test is no t yet approved or cleared by the [place] FDA and  ?has been authorized for detection and/or diagnosis of SARS-CoV-2 by ?FDA under an Emergency Use Authorization (EUA). This EUA will remain  ?in effect (meaning this test can be used) for the duration of the ?COVID-19 declaration under Section 564(b)(1) of the Act, 21 ?U.S.C.section 360bbb-3(b)(1), unless the authorization is terminated  ?or revoked sooner.  ? ? ?  ? Influenza A by PCR NEGATIVE NEGATIVE Final  ? Influenza B by PCR NEGATIVE NEGATIVE Final  ?   Comment: (NOTE) ?The Xpert Xpress SARS-CoV-2/FLU/RSV plus assay is intended as an aid ?in the diagnosis of influenza from Nasopharyngeal swab specimens and ?should not be used as a sole basis for treatment. Nasal washings an

## 2021-09-04 NOTE — Progress Notes (Signed)
Patient's family at bedside and requesting that patient receives a nephrology consult. Family explains that this has been requested multiple times. This RN informed them that their wishes will be passed on to day shift RN and MD. Patient stable at this time.  ?

## 2021-09-04 NOTE — Progress Notes (Signed)
During patient's bath the patient was noted to have LUE twitching and RUE flexion with twitching. Patient was unable to follow commands at that time. Elink called to camera in, orders given. Daughter [name] was promptly called and updated about the event.  ?

## 2021-09-04 NOTE — Progress Notes (Signed)
eLink Physician-Brief Progress Note ?Patient Name: Ricky Hayden ?DOB: 29-Aug-1945 ?MRN: [id number] ? ? ?Date of Service ? 09/04/2021  ?HPI/Events of Note ? Notified for seizures.  ?On camera evaluation, pt intubated, sedated, with twitching of the LUE. ?Pt on phenobarbital for seizure prophylaxis given prior brain surgery.    ?eICU Interventions ? One time ativan ordered. Will follow response.  ?Maintain seizure precautions.  ?If with persistent seizures, would plan to start propofol vs. Midazolam infusion.  ?Will chack AM labs as well, see if there are any abnormalities that need to be addressed.   ? ? ? ?  ? ?[place] ?09/04/2021, 2:02 AM ?

## 2021-09-05 ENCOUNTER — Inpatient Hospital Stay (HOSPITAL_COMMUNITY): Payer: No Typology Code available for payment source

## 2021-09-05 DIAGNOSIS — B964 Proteus (mirabilis) (morganii) as the cause of diseases classified elsewhere: Secondary | ICD-10-CM | POA: Diagnosis not present

## 2021-09-05 DIAGNOSIS — J9690 Respiratory failure, unspecified, unspecified whether with hypoxia or hypercapnia: Secondary | ICD-10-CM | POA: Diagnosis not present

## 2021-09-05 DIAGNOSIS — A419 Sepsis, unspecified organism: Secondary | ICD-10-CM | POA: Diagnosis not present

## 2021-09-05 LAB — CBC
HCT: 35.5 % — ABNORMAL LOW (ref 39.0–52.0)
Hemoglobin: 11.4 g/dL — ABNORMAL LOW (ref 13.0–17.0)
MCH: 31.3 pg (ref 26.0–34.0)
MCHC: 32.1 g/dL (ref 30.0–36.0)
MCV: 97.5 fL (ref 80.0–100.0)
Platelets: 105 10*3/uL — ABNORMAL LOW (ref 150–400)
RBC: 3.64 MIL/uL — ABNORMAL LOW (ref 4.22–5.81)
RDW: 13.9 % (ref 11.5–15.5)
WBC: 9.4 10*3/uL (ref 4.0–10.5)
nRBC: 0 % (ref 0.0–0.2)

## 2021-09-05 LAB — COMPREHENSIVE METABOLIC PANEL
ALT: 26 U/L (ref 0–44)
AST: 75 U/L — ABNORMAL HIGH (ref 15–41)
Albumin: 2.2 g/dL — ABNORMAL LOW (ref 3.5–5.0)
Alkaline Phosphatase: 142 U/L — ABNORMAL HIGH (ref 38–126)
Anion gap: 8 (ref 5–15)
BUN: 92 mg/dL — ABNORMAL HIGH (ref 8–23)
CO2: 30 mmol/L (ref 22–32)
Calcium: 8.8 mg/dL — ABNORMAL LOW (ref 8.9–10.3)
Chloride: 107 mmol/L (ref 98–111)
Creatinine, Ser: 3.75 mg/dL — ABNORMAL HIGH (ref 0.61–1.24)
GFR, Estimated: 16 mL/min — ABNORMAL LOW (ref >60)
Glucose, Bld: 153 mg/dL — ABNORMAL HIGH (ref 70–99)
Potassium: 4.6 mmol/L (ref 3.5–5.1)
Sodium: 145 mmol/L (ref 135–145)
Total Bilirubin: 0.6 mg/dL (ref 0.3–1.2)
Total Protein: 6.5 g/dL (ref 6.5–8.1)

## 2021-09-05 LAB — GLUCOSE, CAPILLARY
Glucose-Capillary: 146 mg/dL — ABNORMAL HIGH (ref 70–99)
Glucose-Capillary: 156 mg/dL — ABNORMAL HIGH (ref 70–99)
Glucose-Capillary: 136 mg/dL — ABNORMAL HIGH (ref 70–99)
Glucose-Capillary: 159 mg/dL — ABNORMAL HIGH (ref 70–99)
Glucose-Capillary: 149 mg/dL — ABNORMAL HIGH (ref 70–99)
Glucose-Capillary: 175 mg/dL — ABNORMAL HIGH (ref 70–99)

## 2021-09-05 LAB — PHOSPHORUS: Phosphorus: 3.4 mg/dL (ref 2.5–4.6)

## 2021-09-05 LAB — MAGNESIUM: Magnesium: 3.2 mg/dL — ABNORMAL HIGH (ref 1.7–2.4)

## 2021-09-05 MED ORDER — POLYETHYLENE GLYCOL 3350 17 G PO PACK
17.0000 g | PACK | Freq: Two times a day (BID) | ORAL | Status: DC
  Administered 2021-09-05 – 2021-09-08 (×8): 17 g
  Filled 2021-09-05 (×9): qty 1

## 2021-09-05 NOTE — Progress Notes (Signed)
eLink Physician-Brief Progress Note ?Patient Name: Rolf Fells ?DOB: 12-30-1945 ?MRN: [id number] ? ? ?Date of Service ? 09/05/2021  ?HPI/Events of Note ? Nursing reports increased WOB and blood suctioned from ETT. Patient is on Eliquis for AFIB. Video reveals small amount of blood in [place] suction tubing.    ?eICU Interventions ? Plan: ?Send AM CBC now. ?Portable CXR STAT. ?Increase PEEP to 10.  ?If bleeding heavily, will need to reverse Eliquis.   ? ? ? ?Intervention Category ?Major Interventions: Other: ? ?Christopherjohn Schiele Cornelia [place] ?09/05/2021, 3:41 AM ?

## 2021-09-05 NOTE — Progress Notes (Signed)
? ?NAME:  Ricky Hayden, MRN:  [id number], DOB:  Oct 14, 1945, LOS: 7 ?ADMISSION DATE:  08/28/2021, CONSULTATION DATE:  5/2 ?REFERRING MD:  Dr. Avon Gully, CHIEF COMPLAINT:  Acute respiratory failure  ? ?History of Present Illness:  ?History obtained from chart review and daughter [name] at bedside. ? ?[name] is a 76 yo gentleman w/ pertinent PMH of chronic HFrEF with ICD, a-fib, CKD stage IIIb, COPD who presented to [facility] on 5/2 w/ hematuria x 3 weeks. The week prior to admission he was started on Bactrim for urine cultures showing proteus mirabilis. He began having worsening fever and SOB and was brought to [facility] ED on 4/27.  ? ?At admission he was initially started on BiPAP for SOB. Fever 103 F. Started on empiric abx. CT abdomen showing nonobstructive nephrolithiasis. Urology consulted with no indication for procedure.  Further work up showing sepsis from UTI w/ pyelonephritis. Blood cultures were positive for proteus mirabilis. On Ancef with ID managing antibiotics.  ? ?On 5/2 he began having decreased LOC and increased O2 requirements. Per his RN this began around 11AM and they waited for 2 ABG results before he was placed on BiPAP. Once he was on Bipap he had improvement in saturations. There was concern this was due to volume overload but concerning to give lasix with worsening kidney function.  Abg 7.3/ 61/ 61/ 30. PCCM consulted for hypoxia evaluation and management. ? ?Pertinent  Medical History  ? 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 ? ?Significant Hospital Events: ?Including procedures, antibiotic start and stop dates in addition to other pertinent events   ?4/27: [facility] for sepsis 2/2 UTI, started ceftriaxone and azithro ?4/27-5/1 on ceftriaxone ?5/2:  antibiotics deescalated to cefazolin. PCCM consult for hypoxia. Placed on BIPAP ?5/3: Bipap  all night, worsening mental status and hypoxemia, intubated, short PEA arrest after intubation, hypotensive, central line placed, CXR in AM clear, CXR after intuabntion diffuse R sided infiltrates concerning for aspiration vs HAP ?5/4 stable pressors, o2 weaned, Cr elevated, Uop diminished but not oliguric,  some flexure posturing, EEG ordered - negative ?5/5 pressor off, minimal vent settings, seems to follow commands, UOP improved, nephrology consult at family request ? ?Interim History / Subjective:  ?pressor off, minimal vent settings, seems to follow commands, UOP improved, nephrology consult at family request ? ?Objective   ?Blood pressure 116/60, pulse 86, temperature 98.4 ?F (36.9 ?C), temperature source Oral, resp. rate (!) 22, height 5\' 7"  (1.702 m), weight 106 kg, SpO2 100 %. ?   ?Vent Mode: PSV ?FiO2 (%):  [40 %-50 %] 40 % ?Set Rate:  [22 bmp] 22 bmp ?Vt Set:  [520 mL] 520 mL ?PEEP:  [5 cmH20-10 cmH20] 5 cmH20 ?Pressure Support:  [14 cmH20] 14 cmH20 ?Plateau Pressure:  [16 cmH20-22 cmH20] 19 cmH20  ? ?Intake/Output Summary (Last 24 hours) at 09/05/2021 0949 ?Last data filed at 09/05/2021 0600 ?Gross per 24 hour  ?Intake 2332.37 ml  ?Output 1085 ml  ?Net 1247.37 ml  ? ? ?Filed Weights  ? 08/29/21 0625 09/05/21 0500  ?Weight: 107.2 kg 106 kg  ? ? ?Examination: ?General: ill appearing man, intubated ?HENT: [place]/AT, eyes anicteric ?Lungs: corase on right, ventilated sounds ?Cardiovascular: S1S2, RRR ?Abdomen: obese, soft, NT ?Extremities: minimal peripheral edema, no cyanosis ?Neuro: barely arousal with sternal rub,  ?Derm: warm, dry, no diffuse rashes. ? ? ?[facility]  Problem list   ? ? ?Assessment & Plan:  ? ?Acute metabolic encephalopathy- not explained by hypercapnia. Likely related to severe sepsis, now intubated partly due to worsening encephalopathy: follows commands 5/5 ?-RASS 0, -1, wean fentanyl gtt, minimize sedation ? ?Hx of brain surgery, on seizure prophylaxis ?-phenobarb level ok,  continue ?-STAT spot EEG given some flexure posturing negative 5/4 ? ?Shock: septic, improved ?-pressors off AM 5/5 ? ?Acute respiratory failure with hypoxia and hypercapnia- acute worsening 5/3 with CXR concerning for new R pna, aspiration vs HAP, repeat CXR 5/5 improved ?Chronic severe COPD- does not appear acutely exacerbated ?-PRVC, VAP bundle, driving pressure ~10 ?-SBT today ?-cefepime and linezolid (5/4 - 5/5) due to concern for HAP vs aspiration, narrowed to unasyn 5/5 per ID, plan 7 days ? ?Chronic HFrEF: LVEF 40-45% ?-BNP normal, mildly elevated ?-hold coreg and hydralazine ?-holding PTA jardiance, Entresto  ? ?AKI on chronic kidney disease stage IIIb:  Related to shock/ATN from PEA arrest on top of preceding AKI, possible overdiuresis. UOP improving with gentle fluids, tube feeds. ?-renally dose meds, avoid nephrotoxic meds ?-strict I/Os ?-monitor ?-hold Entresto ?-Nephrology consult 5/5 ? ?Sepsis due to Proteus pyelonephritis and bacteremia and HAP ?-appreciate ID's recommendations, Unasyn as above ? ?A-fib on Eliquis  ?-con't eliquis and amiodarone ? ?Acute anemia likely due to acute illness ?-monitor ?-transfuse for Hb<7 or hemodynamically significant bleeding ? ?Acute thrombocytopenia: Due to sepsis ?-monitor ? ?HLD ?-pravastatin ? ? ? ?Best Practice (right click and "Reselect all SmartList Selections" daily)  ? ?Diet/type: tubefeeds ?DVT prophylaxis: DOAC ?GI prophylaxis: PPI ?Lines: N/A ?Foley:  Yes, and it is still needed ?Code Status:  full code ?Last date of multidisciplinary goals of care discussion [5/5- daughter's Ashely and [name] updated] ? ? ?Labs   ?CBC: ?Recent Labs  ?Lab 09/02/21 ?0238 09/03/21 ?0301 09/03/21 ?2725 09/04/21 ?0206 09/05/21 ?3664  ?WBC 8.7 9.9 17.7* 13.0* 9.4  ?NEUTROABS  --   --  12.9*  --   --   ?HGB 12.5* 12.9* 13.3 12.4* 11.4*  ?HCT 37.0* 38.9* 41.9 37.4* 35.5*  ?MCV 94.6 94.9 97.0 94.4 97.5  ?PLT 74* 93* 109* 124* 105*  ? ? ? ?Basic Metabolic Panel: ?Recent Labs  ?Lab  08/30/21 ?4034 08/31/21 ?7425 09/02/21 ?9563 09/03/21 ?0301 09/03/21 ?8756 09/04/21 ?0206 09/04/21 ?1116 09/04/21 ?1451 09/05/21 ?4332  ?NA 140   < > 138 140 143 141  --   --  145  ?K 3.8   < > 4.6 4.6 4.8 4.3  --  4.3 4.6  ?CL 106   < > 103 104 103 106  --   --  107  ?CO2 27   < > 27 28 28 28   --   --  30  ?GLUCOSE 115*   < > 127* 120* 308* 155*  --   --  153*  ?BUN 30*   < > 54* 65* 68* 80*  --   --  92*  ?CREATININE 2.23*   < > 3.26* 3.51* 3.46* 3.55*  --   --  3.75*  ?CALCIUM 8.7*   < > 8.8* 9.0 8.8* 8.6*  --   --  8.8*  ?MG  --   --   --   --  3.2*  --  3.0* 3.1* 3.2*  ?PHOS 4.1  --   --   --  5.6*  --  2.4* 2.5 3.4  ? < > = values in this interval not displayed.  ? ? ?GFR: ?Estimated Creatinine Clearance: 19.2 mL/min (  A) (by C-G formula based on SCr of 3.75 mg/dL (H)). ?Recent Labs  ?Lab 09/02/21 ?6045 09/03/21 ?0301 09/03/21 ?4098 09/03/21 ?1206 09/04/21 ?0206 09/05/21 ?1191  ?WBC  --  9.9 17.7*  --  13.0* 9.4  ?LATICACIDVEN 0.9  --  2.4* 2.0*  --   --   ? ? ? ?Liver Function Tests: ?Recent Labs  ?Lab 08/30/21 ?0152 09/02/21 ?1840 09/03/21 ?0850 09/05/21 ?0313  ?AST  --  36 45* 75*  ?ALT  --  28 21 26   ?ALKPHOS  --  116 123 142*  ?BILITOT  --  0.5 0.5 0.6  ?PROT  --  6.8 6.6 6.5  ?ALBUMIN 3.0* 2.5* 2.4* 2.2*  ? ? ?No results for input(s): LIPASE, AMYLASE in the last 168 hours. ?Recent Labs  ?Lab 09/02/21 ?1840  ?AMMONIA 45*  ? ? ? ?ABG ?   ?Component Value Date/Time  ? PHART 7.39 09/03/2021 1148  ? PCO2ART 50 (H) 09/03/2021 1148  ? PO2ART 171 (H) 09/03/2021 1148  ? HCO3 30.3 (H) 09/03/2021 1148  ? TCO2 29 08/29/2021 0102  ? O2SAT 73.5 09/03/2021 1206  ? ?  ? ?Coagulation Profile: ?Recent Labs  ?Lab 09/03/21 ?0850  ?INR 1.3*  ? ? ? ?Cardiac Enzymes: ?No results for input(s): CKTOTAL, CKMB, CKMBINDEX, TROPONINI in the last 168 hours. ? ?HbA1C: ?Hemoglobin A1C  ?Date/Time Value Ref Range Status  ?10/05/2012 03:13 PM 5.4  Final  ? ?Hgb A1c MFr Bld  ?Date/Time Value Ref Range Status  ?09/02/2021 06:22 PM 5.7 (H)  4.8 - 5.6 % Final  ?  Comment:  ?  (NOTE) ?Pre diabetes:          5.7%-6.4% ? ?Diabetes:              >6.4% ? ?Glycemic control for   <7.0% ?adults with diabetes ?  ?02/06/2008 11:25 PM   Final  ? 5.4 ?(NOTE)   T

## 2021-09-05 NOTE — Consult Note (Signed)
Reason for Consult: Acute kidney injury ?Referring Physician: Larey Days, MD (CCM) ? ?HPI:  ?76 year old man with past medical history significant for chronic combined systolic/diastolic heart failure status post ICD placement, atrial fibrillation, chronic obstructive lung disease, history of brain surgery on seizure prophylaxis and chronic kidney disease stage III (creatinine from January, 2023 was 1.5).  He presented to the emergency room with a 3-week history of hematuria along with worsening fever and shortness of breath while on Bactrim for urine cultures that showed Proteus mirabilis.  On initial evaluation, CT scan of the abdomen/pelvis showed nonobstructive nephrolithiasis and urology consulted with no indications for acute procedures but would likely need repeat imaging to evaluate left renal lower pole lesion.  He was started on intravenous antibiotics with Ancef and also had Proteus mirabilis bacteremia.  He decompensated on 5/2 with decreasing level of consciousness and increasing hypoxia for which he was placed on BiPAP and ultimately intubated. ? ?Concern raised with worsening renal function; creatinine has gradually risen shown below: ?4/27: Creatinine 2.5, 4/28 creatinine 2.4, 4/29 creatinine 2.2, 4/30 creatinine 3.2, 5/1 creatinine 3.25, 5/2 creatinine 3.26, 5/3 creatinine 3.5, 5/4 creatinine 3.55 and today creatinine 3.75.  Earlier has had periodic furosemide boluses for diuresis and his Ferne Coe, torsemide and Aldactone that he was taking prior to admission have been discontinued.  ? ? ?Past Medical History:  ?Diagnosis Date  ? Arthritis   ? osteoarthritis of left knee  ? Ascending aorta dilatation (HCC)   ? 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 Cardiac arrhythmia   ? life threatening, secondary to CCB vs b- blockers  ? Cardiomyopathy ([place])   ? Chronic joint pain   ? Chronic systolic CHF (congestive heart failure) ([place])   ? CKD (chronic kidney disease), stage III ([place])   ? COPD (chronic  obstructive pulmonary disease) ([place])   ? Coronary artery disease   ? Dilated aortic root ([place])   ? Enlarged prostate   ? 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 GERD (gastroesophageal reflux disease)   ? Gout   ? Hiatal hernia   ? Hyperlipidemia   ? Hypertension   ? Intracranial hematoma ([place]) 1995  ? history of, s/p evacuation by Dr. Sherwood Gambler  ? Nocturia   ? Obesity   ? Pansinusitis   ? a.  complicated by brain abscess and bleeding requiring craniotomy in 1995.  ? PONV (postoperative nausea and vomiting)   ? Sinusitis   ? s/p ethmoidectomy and nasal septoplasty  ? Vertigo   ? intermitantly  ? ? ?Past Surgical History:  ?Procedure Laterality Date  ? BIV UPGRADE N/A 01/29/2020  ? Procedure: BIV UPGRADE;  Surgeon: Evans Lance, MD;  Location: [facility];  Service: Cardiovascular;  Laterality: N/A;  ? CIRCUMCISION N/A 11/20/2013  ? Procedure: CIRCUMCISION ADULT;  Surgeon: Claybon Jabs, MD;  Location: [facility];  Service: Urology;  Laterality: N/A;  ? CRANIOTOMY  1995  ? hematomy due to sinus infection   ? PACEMAKER IMPLANT N/A 01/17/2020  ? Procedure: PACEMAKER IMPLANT;  Surgeon: Deboraha Sprang, MD;  Location: [facility];  Service: Cardiovascular;  Laterality: N/A;  ? RIGHT/LEFT HEART CATH AND CORONARY ANGIOGRAPHY N/A 09/20/2019  ? Procedure: RIGHT/LEFT HEART CATH AND CORONARY ANGIOGRAPHY;  Surgeon: Jolaine Artist, MD;  Location: [facility];  Service: Cardiovascular;  Laterality: N/A;  ? shoulder surg rt   1995  ? SINUS SURGERY WITH INSTATRAK    ? ethmoidectomy and  nasal septum repair  ? ? ?Family History  ?Problem Relation Age of Onset  ? Heart failure Mother 38  ? Heart attack Father 65  ? ? ?Social History:  reports that he quit smoking about 35 years ago. He has never used smokeless tobacco. He reports that he does not drink alcohol and does not use drugs. ? ?Allergies:  ?Allergies  ?Allergen Reactions  ? Calcium Channel Blockers Other (See Comments)  ?  Came to  hospital in 1995-caused chest pain ?  ? ? ?Medications: I have reviewed the patient's current medications. ?Scheduled: ? amiodarone  200 mg Per Tube Daily  ? apixaban  5 mg Per Tube BID  ? chlorhexidine gluconate (MEDLINE KIT)  15 mL Mouth Rinse BID  ? Chlorhexidine Gluconate Cloth  6 each Topical Daily  ? docusate  100 mg Per Tube BID  ? feeding supplement (PROSource TF)  45 mL Per Tube BID  ? insulin aspart  0-15 Units Subcutaneous Q4H  ? ipratropium-albuterol  3 mL Nebulization Q6H  ? mouth rinse  15 mL Mouth Rinse 10 times per day  ? pantoprazole sodium  40 mg Per Tube Daily  ? PHENobarbital  64.8 mg Per Tube BID  ? polyethylene glycol  17 g Per Tube BID  ? pravastatin  40 mg Per Tube QPM  ? sodium chloride flush  10-40 mL Intracatheter Q12H  ? ? ? ?  Latest Ref Rng & Units 09/05/2021  ?  3:13 AM 09/04/2021  ?  2:51 PM 09/04/2021  ?  2:06 AM  ?BMP  ?Glucose 70 - 99 mg/dL 153    155    ?BUN 8 - 23 mg/dL 92    80    ?Creatinine 0.61 - 1.24 mg/dL 3.75    3.55    ?Sodium 135 - 145 mmol/L 145    141    ?Potassium 3.5 - 5.1 mmol/L 4.6   4.3   4.3    ?Chloride 98 - 111 mmol/L 107    106    ?CO2 22 - 32 mmol/L 30    28    ?Calcium 8.9 - 10.3 mg/dL 8.8    8.6    ? ? ?  Latest Ref Rng & Units 09/05/2021  ?  3:13 AM 09/04/2021  ?  2:06 AM 09/03/2021  ?  8:50 AM  ?CBC  ?WBC 4.0 - 10.5 K/uL 9.4   13.0   17.7    ?Hemoglobin 13.0 - 17.0 g/dL 11.4   12.4   13.3    ?Hematocrit 39.0 - 52.0 % 35.5   37.4   41.9    ?Platelets 150 - 400 K/uL 105   124   109    ? ? ?DG CHEST PORT 1 VIEW ? ?Result Date: 09/05/2021 ?CLINICAL DATA:  76 year old male with respiratory distress. Intubated. EXAM: PORTABLE CHEST 1 VIEW COMPARISON:  Portable chest 09/03/2021 and earlier. FINDINGS: Portable AP semi upright view at 0442 hours. Stable lines and tubes. Stable left chest pacemaker. Moderately regressed Patchy and confluent right lung opacity, mild residual. Mildly lower lung volumes. No pneumothorax. Small left pleural effusion is possible. And left lung base  retrocardiac opacity has mildly increased. But elsewhere the left lung is well ventilated. Mediastinal contours remain normal. Paucity of bowel gas in the upper abdomen. No acute osseous abnormality identified. IMPRESSION: 1. Stable lines and tubes. 2. Moderately improved right lung ventilation and regressed opacity since 09/03/2021. 3. Mildly increased left lung base opacity, but favor atelectasis and/or small  left pleural effusion. Electronically Signed   By: Genevie Ann M.D.   On: 09/05/2021 05:48  ? ?EEG adult ? ?Result Date: 09/04/2021 ?Lora Havens, MD     09/04/2021 10:59 AM Patient Name: Ricky Hayden MRN: [id number] Epilepsy Attending: Lora Havens Referring Physician/Provider: Lanier Clam, MD Date: 09/04/2021 Duration: 34.45 mins Patient history: 76yo M with ams. EEG to evaluate for seizure Level of alertness: lethargic AEDs during EEG study: Phenobarb Technical aspects: This EEG study was done with scalp electrodes positioned according to the 10-20 International system of electrode placement. Electrical activity was acquired at a sampling rate of _0  and reviewed with a high frequency filter of _1  and a low frequency filter of _2 . EEG data were recorded continuously and digitally stored. Description: EEG showed continuous generalized 3 to 7 Hz theta-delta slowing. Hyperventilation and photic stimulation were not performed.   ABNORMALITY - Continuous slow, generalized IMPRESSION: This study is suggestive of moderate to severe diffuse encephalopathy, nonspecific etiology. No seizures or epileptiform discharges were seen throughout the recording. Priyanka Barbra Sarks   ? ?Review of Systems  ?Unable to perform ROS: Intubated  ?Blood pressure (!) 136/55, pulse 81, temperature 98.8 ?F (37.1 ?C), temperature source Oral, resp. rate 18, height _3  (1.702 m), weight 106 kg, SpO2 97 %. ?Physical Exam ?Vitals and nursing note reviewed.  ?Constitutional:   ?   Appearance: He is obese.  ?   Comments:  Intubated, awake/alert  ?HENT:  ?   Head: Normocephalic.  ?   Right Ear: External ear normal.  ?   Left Ear: External ear normal.  ?   Nose: Nose normal.  ?   Mouth/Throat:  ?   Comments: Intubated ?Eyes:  ?   Shea Stakes

## 2021-09-05 NOTE — Progress Notes (Signed)
Pt transported from 3M08 to CT and back without any complications.  ?

## 2021-09-05 NOTE — Progress Notes (Signed)
?  [facility] ? ? ?Reason for visit: follow up on respiratory failure ? ?Interval History: remains intubated; WBC 9.4; Tmax 101.4; briefly on PEEP of 10; daughter at bedside and asking about platelet count ? ? ?Physical Exam: ?Constitutional:  ?Vitals:  ? 09/05/21 0600 09/05/21 0759  ?BP: 116/60   ?Pulse: 86   ?Resp: (!) 22   ?Temp:  98.4 ?F (36.9 ?C)  ?SpO2: 100%   ? ?HENT: +ET ?Respiratory: respiratory effort on vent ?Cardiovascular: RRR ?GI: soft, nt, nd ? ?Review of Systems: ?Unable to be assessed due to patient factors ? ?Lab Results  ?Component Value Date  ? WBC 9.4 09/05/2021  ? HGB 11.4 (L) 09/05/2021  ? HCT 35.5 (L) 09/05/2021  ? MCV 97.5 09/05/2021  ? PLT 105 (L) 09/05/2021  ?  ?Lab Results  ?Component Value Date  ? CREATININE 3.75 (H) 09/05/2021  ? BUN 92 (H) 09/05/2021  ? NA 145 09/05/2021  ? K 4.6 09/05/2021  ? CL 107 09/05/2021  ? CO2 30 09/05/2021  ?  ?Lab Results  ?Component Value Date  ? ALT 26 09/05/2021  ? AST 75 (H) 09/05/2021  ? ALKPHOS 142 (H) 09/05/2021  ?  ? ?Microbiology: ?Recent Results (from the past 240 hour(s))  ?Urine Culture     Status: None  ? Collection Time: 08/28/21 11:38 PM  ? Specimen: In/Out Cath Urine  ?Result Value Ref Range Status  ? Specimen Description IN/OUT CATH URINE  Final  ? Special Requests NONE  Final  ? Culture   Final  ?  NO GROWTH ?Performed at [facility], [place] [address], [place], [place] [address] ?  ? Report Status 08/30/2021 FINAL  Final  ?Blood Culture (routine x 2)     Status: Abnormal  ? Collection Time: 08/28/21 11:45 PM  ? Specimen: BLOOD LEFT FOREARM  ?Result Value Ref Range Status  ? Specimen Description BLOOD LEFT FOREARM  Final  ? Special Requests   Final  ?  BOTTLES DRAWN AEROBIC AND ANAEROBIC Blood Culture adequate volume  ? Culture  Setup Time   Final  ?  GRAM NEGATIVE RODS ?ANAEROBIC BOTTLE ONLY ?CRITICAL RESULT CALLED TO, READ BACK BY AND VERIFIED WITH: PHARM D J.FRIENS ON [id number] AT 0941 BY  E.PARRISH ?Performed at [facility], [place] [address], [place], [place] [address] ?  ? Culture PROTEUS MIRABILIS (A)  Final  ? Report Status 09/02/2021 FINAL  Final  ? Organism ID, Bacteria PROTEUS MIRABILIS  Final  ?    Susceptibility  ? Proteus mirabilis - MIC*  ?  AMPICILLIN <=2 SENSITIVE Sensitive   ?  CEFAZOLIN <=4 SENSITIVE Sensitive   ?  CEFEPIME <=0.12 SENSITIVE Sensitive   ?  CEFTAZIDIME <=1 SENSITIVE Sensitive   ?  CEFTRIAXONE <=0.25 SENSITIVE Sensitive   ?  CIPROFLOXACIN <=0.25 SENSITIVE Sensitive   ?  GENTAMICIN <=1 SENSITIVE Sensitive   ?  IMIPENEM 2 SENSITIVE Sensitive   ?  TRIMETH/SULFA <=20 SENSITIVE Sensitive   ?  AMPICILLIN/SULBACTAM <=2 SENSITIVE Sensitive   ?  PIP/TAZO <=4 SENSITIVE Sensitive   ?  * PROTEUS MIRABILIS  ?Blood Culture ID Panel (Reflexed)     Status: Abnormal  ? Collection Time: 08/28/21 11:45 PM  ?Result Value Ref Range Status  ? Enterococcus faecalis NOT DETECTED NOT DETECTED Final  ? Enterococcus Faecium NOT DETECTED NOT DETECTED Final  ? Listeria monocytogenes NOT DETECTED NOT DETECTED Final  ? Staphylococcus species NOT DETECTED NOT DETECTED Final  ? Staphylococcus  aureus (BCID) NOT DETECTED NOT DETECTED Final  ? Staphylococcus epidermidis NOT DETECTED NOT DETECTED Final  ? Staphylococcus lugdunensis NOT DETECTED NOT DETECTED Final  ? Streptococcus species NOT DETECTED NOT DETECTED Final  ? Streptococcus agalactiae NOT DETECTED NOT DETECTED Final  ? Streptococcus pneumoniae NOT DETECTED NOT DETECTED Final  ? Streptococcus pyogenes NOT DETECTED NOT DETECTED Final  ? A.calcoaceticus-baumannii NOT DETECTED NOT DETECTED Final  ? Bacteroides fragilis NOT DETECTED NOT DETECTED Final  ? Enterobacterales DETECTED (A) NOT DETECTED Final  ?  Comment: Enterobacterales represent a large order of gram negative bacteria, not a single organism. ?CRITICAL RESULT CALLED TO, READ BACK BY AND VERIFIED WITH: ?PHARM D J.FRIENS ON [id number] AT 0941 BY E.PARRISH ?  ? Enterobacter cloacae complex  NOT DETECTED NOT DETECTED Final  ? Escherichia coli NOT DETECTED NOT DETECTED Final  ? Klebsiella aerogenes NOT DETECTED NOT DETECTED Final  ? Klebsiella oxytoca NOT DETECTED NOT DETECTED Final  ? Klebsiella pneumoniae NOT DETECTED NOT DETECTED Final  ? Proteus species DETECTED (A) NOT DETECTED Final  ?  Comment: CRITICAL RESULT CALLED TO, READ BACK BY AND VERIFIED WITH: ?PHARM D J.FRIENS ON [phone] AT 0941 BY E.PARRISH ?  ? Salmonella species NOT DETECTED NOT DETECTED Final  ? Serratia marcescens NOT DETECTED NOT DETECTED Final  ? Haemophilus influenzae NOT DETECTED NOT DETECTED Final  ? Neisseria meningitidis NOT DETECTED NOT DETECTED Final  ? Pseudomonas aeruginosa NOT DETECTED NOT DETECTED Final  ? Stenotrophomonas maltophilia NOT DETECTED NOT DETECTED Final  ? Candida albicans NOT DETECTED NOT DETECTED Final  ? Candida auris NOT DETECTED NOT DETECTED Final  ? Candida glabrata NOT DETECTED NOT DETECTED Final  ? Candida krusei NOT DETECTED NOT DETECTED Final  ? Candida parapsilosis NOT DETECTED NOT DETECTED Final  ? Candida tropicalis NOT DETECTED NOT DETECTED Final  ? Cryptococcus neoformans/gattii NOT DETECTED NOT DETECTED Final  ? CTX-M ESBL NOT DETECTED NOT DETECTED Final  ? Carbapenem resistance IMP NOT DETECTED NOT DETECTED Final  ? Carbapenem resistance KPC NOT DETECTED NOT DETECTED Final  ? Carbapenem resistance NDM NOT DETECTED NOT DETECTED Final  ? Carbapenem resist OXA 48 LIKE NOT DETECTED NOT DETECTED Final  ? Carbapenem resistance VIM NOT DETECTED NOT DETECTED Final  ?  Comment: Performed at [facility], [place] [address] [place], [place] [address]  ?Blood Culture (routine x 2)     Status: None  ? Collection Time: 08/28/21 11:47 PM  ? Specimen: BLOOD  ?Result Value Ref Range Status  ? Specimen Description BLOOD LEFT ANTECUBITAL  Final  ? Special Requests   Final  ?  BOTTLES DRAWN AEROBIC AND ANAEROBIC Blood Culture adequate volume  ? Culture   Final  ?  NO GROWTH 5 DAYS ?Performed at [facility], [place] [address], [place], [place] [address] ?  ? Report Status 09/03/2021 FINAL  Final  ?Resp Panel by RT-PCR (Flu A&B, Covid) Nasopharyngeal Swab     Status: None  ? Collection Time: 08/29/21 12:13 AM  ? Specimen: Nasopharyngeal Swab; Nasopharyngeal(NP) swabs in vial transport medium  ?Result Value Ref Range Status  ? SARS Coronavirus 2 by RT PCR NEGATIVE NEGATIVE Final  ?  Comment: (NOTE) ?SARS-CoV-2 target nucleic acids are NOT DETECTED. ? ?The SARS-CoV-2 RNA is generally detectable in upper respiratory ?specimens during the acute phase of infection. The lowest ?concentration of SARS-CoV-2 viral copies this assay can detect is ?138 copies/mL. A negative result does not preclude SARS-Cov-2 ?infection and should not be used as the  sole basis for treatment or ?other patient management decisions. A negative result may occur with  ?improper specimen collection/handling, submission of specimen other ?than nasopharyngeal swab, presence of viral mutation(s) within the ?areas targeted by this assay, and inadequate number of viral ?copies(<138 copies/mL). A negative result must be combined with ?clinical observations, patient history, and epidemiological ?information. The expected result is Negative. ? ?Fact Sheet for Patients:  ?EntrepreneurPulse.com.au ? ?Fact Sheet for Healthcare Providers:  ?IncredibleEmployment.be ? ?This test is no t yet approved or cleared by the [place] FDA and  ?has been authorized for detection and/or diagnosis of SARS-CoV-2 by ?FDA under an Emergency Use Authorization (EUA). This EUA will remain  ?in effect (meaning this test can be used) for the duration of the ?COVID-19 declaration under Section 564(b)(1) of the Act, 21 ?U.S.C.section 360bbb-3(b)(1), unless the authorization is terminated  ?or revoked sooner.  ? ? ?  ? Influenza A by PCR NEGATIVE NEGATIVE Final  ? Influenza B by PCR NEGATIVE NEGATIVE Final  ?  Comment: (NOTE) ?The Xpert Xpress  SARS-CoV-2/FLU/RSV plus assay is intended as an aid ?in the diagnosis of influenza from Nasopharyngeal swab specimens and ?should not be used as a sole basis for treatment. Nasal washings and ?aspirates are unacceptabl

## 2021-09-06 DIAGNOSIS — J9601 Acute respiratory failure with hypoxia: Secondary | ICD-10-CM | POA: Diagnosis not present

## 2021-09-06 LAB — CBC
HCT: 30.1 % — ABNORMAL LOW (ref 39.0–52.0)
Hemoglobin: 9.5 g/dL — ABNORMAL LOW (ref 13.0–17.0)
MCH: 30.5 pg (ref 26.0–34.0)
MCHC: 31.6 g/dL (ref 30.0–36.0)
MCV: 96.8 fL (ref 80.0–100.0)
Platelets: 110 10*3/uL — ABNORMAL LOW (ref 150–400)
RBC: 3.11 MIL/uL — ABNORMAL LOW (ref 4.22–5.81)
RDW: 14.3 % (ref 11.5–15.5)
WBC: 9.2 10*3/uL (ref 4.0–10.5)
nRBC: 0 % (ref 0.0–0.2)

## 2021-09-06 LAB — GLUCOSE, CAPILLARY
Glucose-Capillary: 153 mg/dL — ABNORMAL HIGH (ref 70–99)
Glucose-Capillary: 153 mg/dL — ABNORMAL HIGH (ref 70–99)
Glucose-Capillary: 162 mg/dL — ABNORMAL HIGH (ref 70–99)
Glucose-Capillary: 176 mg/dL — ABNORMAL HIGH (ref 70–99)
Glucose-Capillary: 186 mg/dL — ABNORMAL HIGH (ref 70–99)
Glucose-Capillary: 206 mg/dL — ABNORMAL HIGH (ref 70–99)

## 2021-09-06 LAB — RENAL FUNCTION PANEL
Albumin: 1.9 g/dL — ABNORMAL LOW (ref 3.5–5.0)
Anion gap: 4 — ABNORMAL LOW (ref 5–15)
BUN: 88 mg/dL — ABNORMAL HIGH (ref 8–23)
CO2: 28 mmol/L (ref 22–32)
Calcium: 8.2 mg/dL — ABNORMAL LOW (ref 8.9–10.3)
Chloride: 117 mmol/L — ABNORMAL HIGH (ref 98–111)
Creatinine, Ser: 3.45 mg/dL — ABNORMAL HIGH (ref 0.61–1.24)
GFR, Estimated: 18 mL/min — ABNORMAL LOW (ref >60)
Glucose, Bld: 184 mg/dL — ABNORMAL HIGH (ref 70–99)
Phosphorus: 3.2 mg/dL (ref 2.5–4.6)
Potassium: 4.2 mmol/L (ref 3.5–5.1)
Sodium: 149 mmol/L — ABNORMAL HIGH (ref 135–145)

## 2021-09-06 MED ORDER — SENNA 8.6 MG PO TABS
1.0000 | ORAL_TABLET | Freq: Two times a day (BID) | ORAL | Status: DC
  Administered 2021-09-06 – 2021-09-09 (×7): 8.6 mg
  Filled 2021-09-06 (×7): qty 1

## 2021-09-06 MED ORDER — INSULIN GLARGINE-YFGN 100 UNIT/ML ~~LOC~~ SOLN
10.0000 [IU] | Freq: Every day | SUBCUTANEOUS | Status: DC
  Administered 2021-09-06 – 2021-09-09 (×4): 10 [IU] via SUBCUTANEOUS
  Filled 2021-09-06 (×4): qty 0.1

## 2021-09-06 MED ORDER — ALBUMIN HUMAN 25 % IV SOLN
12.5000 g | Freq: Once | INTRAVENOUS | Status: AC
  Administered 2021-09-06: 12.5 g via INTRAVENOUS
  Filled 2021-09-06: qty 50

## 2021-09-06 MED ORDER — FUROSEMIDE 10 MG/ML IJ SOLN
80.0000 mg | Freq: Once | INTRAMUSCULAR | Status: AC
  Administered 2021-09-06: 80 mg via INTRAVENOUS
  Filled 2021-09-06: qty 8

## 2021-09-06 MED ORDER — FREE WATER
250.0000 mL | Status: DC
  Administered 2021-09-06 – 2021-09-07 (×5): 250 mL

## 2021-09-06 NOTE — Progress Notes (Signed)
Patient ID: Ricky Hayden, male   DOB: 1945/12/28, 76 y.o.   MRN: [id number] ?[facility] ?Progress Note  ? ?Assessment/ Plan:   ?1.  Acute kidney injury on chronic kidney disease stage III: The available history and timeline of events points largely to ATN associated from severe urinary tract infection with sepsis/shock.  His labs this morning indicate some improvement of renal function with creatinine down to 3.45 from 3.75.  I will begin him on free water flushes and challenge him with a dose of furosemide to try and see if we can augment urine output/volume unloading.  No acute indications for dialysis noted at this time. ?2.  Severe urinary tract infection with sepsis: With evidence of Proteus urinary tract infection as well as bacteremia for which he is currently on antibiotic therapy with Unasyn.  Ongoing follow-up by the infectious disease service-their note reviewed. ?3.  Acute hypoxic/hypercarbic respiratory failure: Suspected plausibly to have had an aspiration event and on coverage for HCAP. ?4.  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 ?5.  Hypernatremia: Secondary to insensible losses/decreased intake, begin free water flushes. ? ?Subjective:   ?No acute events noted overnight, CT scan of the head negative for acute intracranial process.  ? ?Objective:   ?BP 132/62 (BP Location: Right Arm)   Pulse 78   Temp (!) 100.6 ?F (38.1 ?C) (Oral)   Resp 20   Ht $R'5\' 7"'Zf$  (1.702 m)   Wt 106.6 kg   SpO2 100%   BMI 36.81 kg/m?  ? ?Intake/Output Summary (Last 24 hours) at 09/06/2021 0915 ?Last data filed at 09/06/2021 0800 ?Gross per 24 hour  ?Intake 1398.67 ml  ?Output 1340 ml  ?Net 58.67 ml  ? ?Weight change: 0.6 kg ? ?Physical Exam: ?Gen: Intubated, awakens to calling his name ?CVS: Pulse regular rhythm, normal rate, S1 and S2 normal ?Resp: On ventilator, anteriorly clear to  auscultation ?Abd: Soft, obese, nontender, bowel sounds normal ?Ext: 2+ lower extremity edema ? ?Imaging: ?CT HEAD WO CONTRAST (5MM) ? ?Result Date: 09/05/2021 ?CLINICAL DATA:  Altered mental status EXAM: CT HEAD WITHOUT CONTRAST TECHNIQUE: Contiguous axial images were obtained from the base of the skull through the vertex without intravenous contrast. RADIATION DOSE REDUCTION: This exam was performed according to the departmental dose-optimization program which includes automated exposure control, adjustment of the mA and/or kV according to patient size and/or use of iterative reconstruction technique. COMPARISON:  07/06/2021 FINDINGS: Brain: Left frontal lobe encephalomalacia with ex vacuo dilatation of the left lateral ventricle, stable in appearance from prior. No evidence of acute infarction, hemorrhage, hydrocephalus, extra-axial collection, or mass lesion. Patchy low-density changes within the periventricular and subcortical white matter compatible with chronic microvascular ischemic change. Mild diffuse cerebral volume loss. Vascular: No hyperdense vessel or unexpected calcification. Skull: Prior left frontal craniotomy. Negative for acute calvarial fracture. Sinuses/Orbits: Complete opacification of the right sphenoid sinus with mucosal thickening in the posterior right ethmoid air cells. Other: None. IMPRESSION: No acute intracranial findings.  Stable exam. Electronically Signed   By: Davina Poke D.O.   On: 09/05/2021 16:21  ? ?DG CHEST PORT 1 VIEW ? ?Result Date: 09/05/2021 ?CLINICAL DATA:  76 year old male with respiratory distress. Intubated. EXAM: PORTABLE CHEST 1 VIEW COMPARISON:  Portable chest 09/03/2021 and earlier. FINDINGS: Portable AP semi upright view at 0442 hours. Stable lines and tubes. Stable left chest pacemaker. Moderately regressed Patchy and confluent right lung opacity,  mild residual. Mildly lower lung volumes. No pneumothorax. Small left pleural effusion is possible. And left lung  base retrocardiac opacity has mildly increased. But elsewhere the left lung is well ventilated. Mediastinal contours remain normal. Paucity of bowel gas in the upper abdomen. No acute osseous abnormality identified. IMPRESSION: 1. Stable lines and tubes. 2. Moderately improved right lung ventilation and regressed opacity since 09/03/2021. 3. Mildly increased left lung base opacity, but favor atelectasis and/or small left pleural effusion. Electronically Signed   By: Genevie Ann M.D.   On: 09/05/2021 05:48  ? ?EEG adult ? ?Result Date: 09/04/2021 ?Lora Havens, MD     09/04/2021 10:59 AM Patient Name: Konnor Vondrasek MRN: [id number] Epilepsy Attending: Lora Havens Referring Physician/Provider: Lanier Clam, MD Date: 09/04/2021 Duration: 34.45 mins Patient history: 76yo M with ams. EEG to evaluate for seizure Level of alertness: lethargic AEDs during EEG study: Phenobarb Technical aspects: This EEG study was done with scalp electrodes positioned according to the 10-20 International system of electrode placement. Electrical activity was acquired at a sampling rate of 500Hz  and reviewed with a high frequency filter of 70Hz  and a low frequency filter of 1Hz . EEG data were recorded continuously and digitally stored. Description: EEG showed continuous generalized 3 to 7 Hz theta-delta slowing. Hyperventilation and photic stimulation were not performed.   ABNORMALITY - Continuous slow, generalized IMPRESSION: This study is suggestive of moderate to severe diffuse encephalopathy, nonspecific etiology. No seizures or epileptiform discharges were seen throughout the recording. Priyanka Barbra Sarks   ? ?Labs: ?BMET ?Recent Labs  ?Lab 09/01/21 ?0308 09/02/21 ?8588 09/03/21 ?0301 09/03/21 ?5027 09/04/21 ?0206 09/04/21 ?1116 09/04/21 ?1451 09/05/21 ?0313 09/06/21 ?0800  ?NA 137 138 140 143 141  --   --  145 149*  ?K 4.3 4.6 4.6 4.8 4.3  --  4.3 4.6 4.2  ?CL 102 103 104 103 106  --   --  107 117*  ?CO2 26 27 28 28 28   --   --  30  28  ?GLUCOSE 160* 127* 120* 308* 155*  --   --  153* 184*  ?BUN 44* 54* 65* 68* 80*  --   --  92* 88*  ?CREATININE 3.25* 3.26* 3.51* 3.46* 3.55*  --   --  3.75* 3.45*  ?CALCIUM 8.4* 8.8* 9.0 8.8* 8.6*  --   --  8.8* 8.2*  ?PHOS  --   --   --  5.6*  --  2.4* 2.5 3.4 3.2  ? ?CBC ?Recent Labs  ?Lab 09/03/21 ?0850 09/04/21 ?0206 09/05/21 ?7412 09/06/21 ?0510  ?WBC 17.7* 13.0* 9.4 9.2  ?NEUTROABS 12.9*  --   --   --   ?HGB 13.3 12.4* 11.4* 9.5*  ?HCT 41.9 37.4* 35.5* 30.1*  ?MCV 97.0 94.4 97.5 96.8  ?PLT 109* 124* 105* 110*  ? ?Medications:   ? ? amiodarone  200 mg Per Tube Daily  ? apixaban  5 mg Per Tube BID  ? chlorhexidine gluconate (MEDLINE KIT)  15 mL Mouth Rinse BID  ? Chlorhexidine Gluconate Cloth  6 each Topical Daily  ? docusate  100 mg Per Tube BID  ? feeding supplement (PROSource TF)  45 mL Per Tube BID  ? insulin aspart  0-15 Units Subcutaneous Q4H  ? insulin glargine-yfgn  10 Units Subcutaneous Daily  ? ipratropium-albuterol  3 mL Nebulization Q6H  ? mouth rinse  15 mL Mouth Rinse 10 times per day  ? pantoprazole sodium  40 mg Per Tube Daily  ?  PHENobarbital  64.8 mg Per Tube BID  ? polyethylene glycol  17 g Per Tube BID  ? pravastatin  40 mg Per Tube QPM  ? senna  1 tablet Per Tube BID  ? sodium chloride flush  10-40 mL Intracatheter Q12H  ? ?Elmarie Shiley, MD ?09/06/2021, 9:15 AM  ? ?

## 2021-09-06 NOTE — Progress Notes (Signed)
? ?NAME:  Ricky Hayden, MRN:  [id number], DOB:  27-Dec-1945, LOS: 8 ?ADMISSION DATE:  08/28/2021, CONSULTATION DATE:  5/2 ?REFERRING MD:  Dr. Avon Gully, CHIEF COMPLAINT:  Acute respiratory failure  ? ?History of Present Illness:  ?History obtained from chart review and daughter [name] at bedside. ? ?Ricky Hayden is a 76 yo gentleman w/ pertinent PMH of chronic HFrEF with ICD, a-fib, CKD stage IIIb, COPD who presented to [facility] on 5/2 w/ hematuria x 3 weeks. The week prior to admission he was started on Bactrim for urine cultures showing proteus mirabilis. He began having worsening fever and SOB and was brought to [facility] ED on 4/27.  ? ?At admission he was initially started on BiPAP for SOB. Fever 103 F. Started on empiric abx. CT abdomen showing nonobstructive nephrolithiasis. Urology consulted with no indication for procedure.  Further work up showing sepsis from UTI w/ pyelonephritis. Blood cultures were positive for proteus mirabilis. On Ancef with ID managing antibiotics.  ? ?On 5/2 he began having decreased LOC and increased O2 requirements. Per his RN this began around 11AM and they waited for 2 ABG results before he was placed on BiPAP. Once he was on Bipap he had improvement in saturations. There was concern this was due to volume overload but concerning to give lasix with worsening kidney function.  Abg 7.3/ 61/ 61/ 30. PCCM consulted for hypoxia evaluation and management. ? ?Pertinent  Medical History  ? 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 ? ?Significant Hospital Events: ?Including procedures, antibiotic start and stop dates in addition to other pertinent events   ?4/27: admitted to [facility] for sepsis 2/2 UTI, started ceftriaxone and azithro ?4/27-5/1 on ceftriaxone ?5/2:  antibiotics deescalated to cefazolin. PCCM consult for hypoxia. Placed on BIPAP ?5/3: Bipap  all night, worsening mental status and hypoxemia, intubated, short PEA arrest after intubation, hypotensive, central line placed, CXR in AM clear, CXR after intuabntion diffuse R sided infiltrates concerning for aspiration vs HAP ?5/4 stable pressors, o2 weaned, Cr elevated, Uop diminished but not oliguric,  some flexure posturing, EEG ordered - negative ?5/5 pressor off, minimal vent settings, seems to follow commands, UOP improved, nephrology consult at family request ?5/6 no acute complications overnight currently tolerating vent wean but requiring 12 pressure repeat.  Renal function this a.m. pending ? ?Interim History / Subjective:  ?Open eyes to verbal stimuli, tracks movement in room ? ?Objective   ?Blood pressure 137/63, pulse 76, temperature (!) 100.6 ?F (38.1 ?C), temperature source Oral, resp. rate 19, height 5\' 7"  (1.702 m), weight 106.6 kg, SpO2 100 %. ?   ?Vent Mode: PSV;CPAP ?FiO2 (%):  [40 %-50 %] 40 % ?Set Rate:  [22 bmp] 22 bmp ?Vt Set:  [520 mL] 520 mL ?PEEP:  [5 cmH20-8 cmH20] 5 cmH20 ?Pressure Support:  [12 cmH20-14 cmH20] 12 cmH20 ?Plateau Pressure:  [17 PJK93-26 cmH20] 24 cmH20  ? ?Intake/Output Summary (Last 24 hours) at 09/06/2021 7124 ?Last data filed at 09/06/2021 0600 ?Gross per 24 hour  ?Intake 1140.58 ml  ?Output 1390 ml  ?Net -249.42 ml  ? ? ?Filed Weights  ? 08/29/21 0625 09/05/21 0500 09/06/21 0500  ?Weight: 107.2 kg 106 kg 106.6 kg  ? ? ?Examination: ?General: Acute on chronic ill-appearing elderly gentleman lying in bed in no acute distress ?HEENT: ETT, MM pink/moist, PERRL,  ?Neuro: Opens eyes to verbal stimuli ?CV:  s1s2 regular rate and rhythm, no murmur, rubs, or gallops,  ?PULM: Patient bilaterally, no increased work of breathing, no added breath sounds, tolerating ventilator ?GI: soft, bowel sounds active in all 4 quadrants, non-tender, non-distended, tolerating TF ?Extremities: warm/dry, generalized nonpitting edema  ?Skin: no rashes or lesions ? ?Resolved Hospital Problem list    ? ? ?Assessment & Plan:  ? ?Acute metabolic encephalopathy ?- not explained by hypercapnia. Likely related to severe sepsis, now intubated partly due to worsening encephalopathy ?-Head CT 5/5 negative ?Hx of brain surgery, on seizure prophylaxis ?-Spot EEG 5/4 negative for seizure activity ?P: ?Maintain neuro protective measures; goal for eurothermia, euglycemia, eunatermia, normoxia, and PCO2 goal of 35-40 ?Nutrition and bowel regiment  ?Seizure precautions  ?Aspirations precautions  ?Minimize sedation as able ? ?Sepsis due to Proteus pyelonephritis and bacteremia with additional concern for aspiration pneumonia  ?P: ?Infectious disease following, appreciate assistance ?Continue Unasyn through 5/10 per ID ?No longer requiring pressor support ? ?Acute respiratory failure with hypoxia and hypercapnia  ?Concern for aspiration event during intubation ?-Acute worsening 5/3 with CXR concerning for new R pna, aspiration vs HAP, repeat CXR 5/5 improved ?-cefepime and linezolid (5/4 - 5/5) due to concern for HAP vs aspiration, narrowed to unasyn 5/5 per ID, plan 7 days ?Chronic severe COPD ?-Does not appear acutely exacerbated ?P: ?Continue ventilator support with lung protective strategies  ?SBT currently requiring 12 pressure support of her PEEP wean as able ?Wean PEEP and FiO2 for sats greater than 90%. ?Head of bed elevated 30 degrees. ?Plateau pressures less than 30 cm H20.  ?Follow intermittent chest x-ray and ABG.   ?SAT/SBT as tolerated, mentation preclude extubation  ?Ensure adequate pulmonary hygiene  ?Follow cultures  ?VAP bundle in place  ?PAD protocol ? ?Brief PEA cardiac arrest post intubation ?-Unable to locate duration of arrest time ?Chronic systolic and diastolic congestive heart failure ?-EF of 40 to 45% with grade 1 diastolic dysfunction on echo 08/29/2021 ?A-fib on Eliquis  ?Hx of HTN/HLD ?-Home medication includes amiodarone, Eliquis, carvedilol, hydralazine, Entresto, spironolactone and  pravastatin ?P: ?Continue home antiarrhythmic amiodarone and anticoagulation Eliquis ?Continue to trend hemodynamics to determine appropriate timing to resume home antihypertensives ?Continuous telemetry ?Strict intake and output ?Daily weight ?Daily evaluation for need of increased diuretics ?GDMT as able ? 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 UOP improving with gentle fluids, tube feeds. ?P: ?Nephrology following, appreciate assistance ?Renal panel pending this a.m. ?Follow renal function  ?Monitor urine output ?Trend Bmet ?Avoid nephrotoxins ?ensure adequate renal perfusion  ?Hold home Entresto ? ?Acute anemia ?-Likely due to acute illness ?Acute thrombocytopenia ?-Likely in the setting of sepsis  ?P: ?H&H along with platelets remained stable ?Continue to trend CBC ?Transfuse per protocol ?Hemoglobin goal greater than 7 ? ?Constipation ?P: ?Increase bowel regiment  ? ?Best Practice (right click and "Reselect all SmartList Selections" daily)  ? ?Diet/type: tubefeeds ?DVT prophylaxis: DOAC ?GI prophylaxis: PPI ?Lines: N/A ?Foley:  Yes, and it is still needed ?Code Status:  full code ?Last date of multidisciplinary goals of care discussion: No family at bedside this a.m., will update on arrival ? ?Critical care time:  ? ?CRITICAL CARE ?Performed by: Tavyn Kurka D. Harris ? ?Total critical care time: 38 minutes ? ?Critical care time was exclusive of separately billable procedures and treating other patients. ? ?Critical care was necessary to treat or prevent imminent or life-threatening deterioration. ? ?Critical care was time spent personally  by me on the following activities: development of treatment plan with patient and/or surrogate as well as nursing, discussions with consultants, evaluation of patient's response to treatment, examination of patient, obtaining history from patient or surrogate, ordering and performing  treatments and interventions, ordering and review of laboratory studies, ordering and review of radiographic studies, pulse oximetry and re-evaluation of patient's condition. ? ?Yeraldy Spike D. Kenton Kingfisher, MD ?09/06/21 8:12 AM ?[facility]

## 2021-09-06 NOTE — Progress Notes (Signed)
RT note. ?Patient flipped to SBT 12/5 40%, vt- 447, RR 18, sat 100%.  ?RT will continue to monitor.  ?

## 2021-09-07 DIAGNOSIS — N390 Urinary tract infection, site not specified: Secondary | ICD-10-CM | POA: Diagnosis not present

## 2021-09-07 DIAGNOSIS — R7881 Bacteremia: Secondary | ICD-10-CM | POA: Diagnosis not present

## 2021-09-07 DIAGNOSIS — N3001 Acute cystitis with hematuria: Secondary | ICD-10-CM | POA: Diagnosis not present

## 2021-09-07 DIAGNOSIS — B964 Proteus (mirabilis) (morganii) as the cause of diseases classified elsewhere: Secondary | ICD-10-CM | POA: Diagnosis not present

## 2021-09-07 DIAGNOSIS — J9601 Acute respiratory failure with hypoxia: Secondary | ICD-10-CM | POA: Diagnosis not present

## 2021-09-07 LAB — CULTURE, RESPIRATORY W GRAM STAIN

## 2021-09-07 LAB — BASIC METABOLIC PANEL
Anion gap: 5 (ref 5–15)
BUN: 85 mg/dL — ABNORMAL HIGH (ref 8–23)
CO2: 29 mmol/L (ref 22–32)
Calcium: 8.3 mg/dL — ABNORMAL LOW (ref 8.9–10.3)
Chloride: 111 mmol/L (ref 98–111)
Creatinine, Ser: 3.26 mg/dL — ABNORMAL HIGH (ref 0.61–1.24)
GFR, Estimated: 19 mL/min — ABNORMAL LOW (ref >60)
Glucose, Bld: 197 mg/dL — ABNORMAL HIGH (ref 70–99)
Potassium: 4.9 mmol/L (ref 3.5–5.1)
Sodium: 145 mmol/L (ref 135–145)

## 2021-09-07 LAB — CBC
HCT: 30.9 % — ABNORMAL LOW (ref 39.0–52.0)
Hemoglobin: 9.4 g/dL — ABNORMAL LOW (ref 13.0–17.0)
MCH: 30.3 pg (ref 26.0–34.0)
MCHC: 30.4 g/dL (ref 30.0–36.0)
MCV: 99.7 fL (ref 80.0–100.0)
Platelets: 127 10*3/uL — ABNORMAL LOW (ref 150–400)
RBC: 3.1 MIL/uL — ABNORMAL LOW (ref 4.22–5.81)
RDW: 14.5 % (ref 11.5–15.5)
WBC: 8.5 10*3/uL (ref 4.0–10.5)
nRBC: 0 % (ref 0.0–0.2)

## 2021-09-07 LAB — RENAL FUNCTION PANEL
Albumin: 2 g/dL — ABNORMAL LOW (ref 3.5–5.0)
Anion gap: 6 (ref 5–15)
BUN: 85 mg/dL — ABNORMAL HIGH (ref 8–23)
CO2: 31 mmol/L (ref 22–32)
Calcium: 8.8 mg/dL — ABNORMAL LOW (ref 8.9–10.3)
Chloride: 115 mmol/L — ABNORMAL HIGH (ref 98–111)
Creatinine, Ser: 3.39 mg/dL — ABNORMAL HIGH (ref 0.61–1.24)
GFR, Estimated: 18 mL/min — ABNORMAL LOW (ref >60)
Glucose, Bld: 168 mg/dL — ABNORMAL HIGH (ref 70–99)
Phosphorus: 3.9 mg/dL (ref 2.5–4.6)
Potassium: 4.8 mmol/L (ref 3.5–5.1)
Sodium: 152 mmol/L — ABNORMAL HIGH (ref 135–145)

## 2021-09-07 LAB — CULTURE, BLOOD (ROUTINE X 2)
Culture: NO GROWTH
Culture: NO GROWTH
Special Requests: ADEQUATE
Special Requests: ADEQUATE

## 2021-09-07 LAB — GLUCOSE, CAPILLARY
Glucose-Capillary: 173 mg/dL — ABNORMAL HIGH (ref 70–99)
Glucose-Capillary: 173 mg/dL — ABNORMAL HIGH (ref 70–99)
Glucose-Capillary: 152 mg/dL — ABNORMAL HIGH (ref 70–99)
Glucose-Capillary: 140 mg/dL — ABNORMAL HIGH (ref 70–99)
Glucose-Capillary: 157 mg/dL — ABNORMAL HIGH (ref 70–99)
Glucose-Capillary: 172 mg/dL — ABNORMAL HIGH (ref 70–99)

## 2021-09-07 MED ORDER — FENTANYL CITRATE (PF) 100 MCG/2ML IJ SOLN: 25.0000 ug | INTRAMUSCULAR | Status: DC | PRN

## 2021-09-07 MED ORDER — DEXTROSE 5 % IV SOLN
500.0000 mg | Freq: Once | INTRAVENOUS | Status: AC
  Administered 2021-09-07: 500 mg via INTRAVENOUS
  Filled 2021-09-07 (×2): qty 500

## 2021-09-07 MED ORDER — FENTANYL CITRATE (PF) 100 MCG/2ML IJ SOLN
25.0000 ug | INTRAMUSCULAR | Status: DC | PRN
  Administered 2021-09-07: 50 ug via INTRAVENOUS
  Administered 2021-09-07: 100 ug via INTRAVENOUS
  Administered 2021-09-08: 50 ug via INTRAVENOUS
  Administered 2021-09-08: 100 ug via INTRAVENOUS
  Administered 2021-09-08 – 2021-09-10 (×3): 50 ug via INTRAVENOUS
  Administered 2021-09-11: 100 ug via INTRAVENOUS
  Administered 2021-09-11: 50 ug via INTRAVENOUS
  Filled 2021-09-07 (×9): qty 2

## 2021-09-07 MED ORDER — FREE WATER
400.0000 mL | Status: DC
  Administered 2021-09-07 – 2021-09-08 (×7): 400 mL

## 2021-09-07 MED ORDER — FUROSEMIDE 10 MG/ML IJ SOLN
80.0000 mg | Freq: Once | INTRAMUSCULAR | Status: AC
  Administered 2021-09-07: 80 mg via INTRAVENOUS
  Filled 2021-09-07: qty 8

## 2021-09-07 MED ORDER — BISACODYL 10 MG RE SUPP
10.0000 mg | Freq: Once | RECTAL | Status: DC
  Filled 2021-09-07: qty 1

## 2021-09-07 NOTE — Progress Notes (Signed)
Patient ID: Ricky Hayden, male   DOB: [birth year], 76 y.o.   MRN: [id number] ?[facility] ?Progress Note  ? ?Assessment/ Plan:   ?1.  Acute kidney injury on chronic kidney disease stage III: The available history and timeline of events points largely to ATN associated from severe urinary tract infection with sepsis/shock.  Urine output noted overnight with furosemide administered for evidence of volume excess earlier.  I will give a dose of chlorothiazide for additional diuresis and efforts at trying to improve volume status/sodium. ?2.  Severe urinary tract infection with sepsis: With evidence of Proteus urinary tract infection as well as bacteremia for which he is currently on antibiotic therapy with Unasyn.  Ongoing follow-up by the infectious disease service-their note reviewed. ?3.  Acute hypoxic/hypercarbic respiratory failure: Suspected plausibly to have had an aspiration event and on coverage for HCAP. ?4.  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 ?5.  Hypernatremia: Secondary to insensible losses/decreased intake, sodium level higher today in spite of starting free water flushes, will increase rate to offset insensible losses/diuresis ? ?Subjective:   ?Evidence of hyperkalemia overnight prompting intravenous furosemide bolus earlier  ? ?Objective:   ?BP (!) 155/61   Pulse 82   Temp 99.3 ?F (37.4 ?C) (Oral)   Resp (!) 26   Ht _0  (1.702 m)   Wt 104.9 kg   SpO2 90%   BMI 36.22 kg/m?  ? ?Intake/Output Summary (Last 24 hours) at 09/07/2021 0908 ?Last data filed at 09/07/2021 0900 ?Gross per 24 hour  ?Intake 1898.42 ml  ?Output 3235 ml  ?Net -1336.58 ml  ? ?Weight change: -1.7 kg ? ?Physical Exam: ?Gen: Intubated, arouses to calling his name ?CVS: Pulse regular rhythm, normal rate, S1 and S2 normal ?Resp: On ventilator, anteriorly  clear to auscultation ?Abd: Soft, obese, nontender, bowel sounds normal ?Ext: 1-2+ lower extremity edema ? ?Imaging: ?CT HEAD WO CONTRAST (5MM) ? ?Result Date: 09/05/2021 ?CLINICAL DATA:  Altered mental status EXAM: CT HEAD WITHOUT CONTRAST TECHNIQUE: Contiguous axial images were obtained from the base of the skull through the vertex without intravenous contrast. RADIATION DOSE REDUCTION: This exam was performed according to the departmental dose-optimization program which includes automated exposure control, adjustment of the mA and/or kV according to patient size and/or use of iterative reconstruction technique. COMPARISON:  07/06/2021 FINDINGS: Brain: Left frontal lobe encephalomalacia with ex vacuo dilatation of the left lateral ventricle, stable in appearance from prior. No evidence of acute infarction, hemorrhage, hydrocephalus, extra-axial collection, or mass lesion. Patchy low-density changes within the periventricular and subcortical white matter compatible with chronic microvascular ischemic change. Mild diffuse cerebral volume loss. Vascular: No hyperdense vessel or unexpected calcification. Skull: Prior left frontal craniotomy. Negative for acute calvarial fracture. Sinuses/Orbits: Complete opacification of the right sphenoid sinus with mucosal thickening in the posterior right ethmoid air cells. Other: None. IMPRESSION: No acute intracranial findings.  Stable exam. Electronically Signed   By: Davina Poke D.O.   On: 09/05/2021 16:21   ? ?Labs: ?BMET ?Recent Labs  ?Lab 09/02/21 ?0238 09/03/21 ?0301 09/03/21 ?0850 09/04/21 ?0206 09/04/21 ?1116 09/04/21 ?1451 09/05/21 ?2536 09/06/21 ?0800 09/07/21 ?0342  ?NA 138 140 143 141  --   --  145 149* 152*  ?K 4.6 4.6 4.8 4.3  --  4.3 4.6 4.2 4.8  ?CL 103 104 103 106  --   --  107 117* 115*  ?  CO2 _0 --   --  _1 ?GLUCOSE 127* 120* 308* 155*  --   --  153* 184* 168*  ?BUN 54* 65* 68* 80*  --   --  92* 88* 85*  ?CREATININE 3.26* 3.51* 3.46* 3.55*   --   --  3.75* 3.45* 3.39*  ?CALCIUM 8.8* 9.0 8.8* 8.6*  --   --  8.8* 8.2* 8.8*  ?PHOS  --   --  5.6*  --  2.4* 2.5 3.4 3.2 3.9  ? ?CBC ?Recent Labs  ?Lab 09/03/21 ?0850 09/04/21 ?0206 09/05/21 ?5789 09/06/21 ?0510 09/07/21 ?0342  ?WBC 17.7* 13.0* 9.4 9.2 8.5  ?NEUTROABS 12.9*  --   --   --   --   ?HGB 13.3 12.4* 11.4* 9.5* 9.4*  ?HCT 41.9 37.4* 35.5* 30.1* 30.9*  ?MCV 97.0 94.4 97.5 96.8 99.7  ?PLT 109* 124* 105* 110* 127*  ? ?Medications:   ? ? amiodarone  200 mg Per Tube Daily  ? apixaban  5 mg Per Tube BID  ? chlorhexidine gluconate (MEDLINE KIT)  15 mL Mouth Rinse BID  ? Chlorhexidine Gluconate Cloth  6 each Topical Daily  ? docusate  100 mg Per Tube BID  ? feeding supplement (PROSource TF)  45 mL Per Tube BID  ? free water  400 mL Per Tube Q4H  ? insulin aspart  0-15 Units Subcutaneous Q4H  ? insulin glargine-yfgn  10 Units Subcutaneous Daily  ? ipratropium-albuterol  3 mL Nebulization Q6H  ? mouth rinse  15 mL Mouth Rinse 10 times per day  ? pantoprazole sodium  40 mg Per Tube Daily  ? PHENobarbital  64.8 mg Per Tube BID  ? polyethylene glycol  17 g Per Tube BID  ? pravastatin  40 mg Per Tube QPM  ? senna  1 tablet Per Tube BID  ? sodium chloride flush  10-40 mL Intracatheter Q12H  ? ?Elmarie Shiley, MD ?09/07/2021, 9:08 AM  ? ?

## 2021-09-07 NOTE — Progress Notes (Signed)
? ?NAME:  Lexander Tremblay, MRN:  [id number], DOB:  1946-01-02, LOS: 9 ?ADMISSION DATE:  08/28/2021, CONSULTATION DATE:  5/2 ?REFERRING MD:  Dr. Avon Gully, CHIEF COMPLAINT:  Acute respiratory failure  ? ?History of Present Illness:  ?History obtained from chart review and daughter [name] at bedside. ? ?Mr. Zimmerle is a 76 yo gentleman w/ pertinent PMH of chronic HFrEF with ICD, a-fib, CKD stage IIIb, COPD who presented to [facility] on 5/2 w/ hematuria x 3 weeks. The week prior to admission he was started on Bactrim for urine cultures showing proteus mirabilis. He began having worsening fever and SOB and was brought to [facility] ED on 4/27.  ? ?At admission he was initially started on BiPAP for SOB. Fever 103 F. Started on empiric abx. CT abdomen showing nonobstructive nephrolithiasis. Urology consulted with no indication for procedure.  Further work up showing sepsis from UTI w/ pyelonephritis. Blood cultures were positive for proteus mirabilis. On Ancef with ID managing antibiotics.  ? ?On 5/2 he began having decreased LOC and increased O2 requirements. Per his RN this began around 11AM and they waited for 2 ABG results before he was placed on BiPAP. Once he was on Bipap he had improvement in saturations. There was concern this was due to volume overload but concerning to give lasix with worsening kidney function.  Abg 7.3/ 61/ 61/ 30. PCCM consulted for hypoxia evaluation and management. ? ?Pertinent  Medical History  ? 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 ? ?Significant Hospital Events: ?Including procedures, antibiotic start and stop dates in addition to other pertinent events   ?4/27: admitted to [facility] for sepsis 2/2 UTI, started ceftriaxone and azithro ?4/27-5/1 on ceftriaxone ?5/2:  antibiotics deescalated to cefazolin. PCCM consult for hypoxia. Placed on BIPAP ?5/3: Bipap  all night, worsening mental status and hypoxemia, intubated, short PEA arrest after intubation, hypotensive, central line placed, CXR in AM clear, CXR after intuabntion diffuse R sided infiltrates concerning for aspiration vs HAP ?5/4 stable pressors, o2 weaned, Cr elevated, Uop diminished but not oliguric,  some flexure posturing, EEG ordered - negative ?5/5 pressor off, minimal vent settings, seems to follow commands, UOP improved, nephrology consult at family request ?5/6 no acute complications overnight currently tolerating vent wean but requiring 12 pressure repeat.  Renal function this a.m. pending ?5/7 patient received 80 mg IV Lasix overnight due to increased CVP and crackles to bases ? ?Interim History / Subjective:  ?Opens eyes to verbal stimuli, limited to no movement seen when asked to follow commands ? ?Objective   ?Blood pressure (!) 159/61, pulse 82, temperature 99.3 ?F (37.4 ?C), temperature source Oral, resp. rate (!) 24, height $RemoveBe'5\' 7"'SMgdquKWY$  (1.702 m), weight 104.9 kg, SpO2 100 %. ?   ?Vent Mode: PSV;CPAP ?FiO2 (%):  [40 %] 40 % ?Set Rate:  [22 bmp] 22 bmp ?Vt Set:  [520 mL] 520 mL ?PEEP:  [5 cmH20] 5 cmH20 ?Pressure Support:  [8 cmH20] 8 cmH20 ?Plateau Pressure:  [14 cmH20-24 cmH20] 22 cmH20  ? ?Intake/Output Summary (Last 24 hours) at 09/07/2021 0849 ?Last data filed at 09/07/2021 0800 ?Gross per 24 hour  ?Intake 1948.43 ml  ?Output 3585 ml  ?Net -1636.57 ml  ? ? ?Filed Weights  ? 09/05/21 0500 09/06/21 0500 09/07/21 0500  ?Weight: 106 kg 106.6 kg 104.9 kg  ? ? ?Examination: ?General: Acute on chronic ill-appearing obese elderly  male lying in bed in no acute distress ?HEENT: ETT, MM pink/moist, PERRL,  ?Neuro: Opens eyes to verbal stimuli, able to follow commands ?CV: s1s2 regular rate and rhythm, no murmur, rubs, or gallops,  ?PULM: Faint snoring type sounds auscultated bilaterally, tolerating ventilator, on pressure support currently ?GI: soft, bowel sounds active in all 4 quadrants, non-tender,  non-distended, tolerating TF ?Extremities: warm/dry, nonpitting edema  ?Skin: no rashes or lesions ? ?Resolved Hospital Problem list   ? ? ?Assessment & Plan:  ? ?Acute metabolic encephalopathy ?- not explained by hypercapnia. Likely related to severe sepsis, now intubated partly due to worsening encephalopathy ?-Head CT 5/5 negative ?Hx of brain surgery, on seizure prophylaxis ?-Spot EEG 5/4 negative for seizure activity ?P: ?Maintain neuro protective measures; goal for eurothermia, euglycemia, eunatermia, normoxia, and PCO2 goal of 35-40 ?Nutrition and bowel regiment  ?Seizure precautions  ?Aspirations precautions  ?Minimize sedation as able ? ?Sepsis due to Proteus pyelonephritis and bacteremia with additional concern for aspiration pneumonia  ?P: ?ID following, appreciate assistance ?Continue Unasyn through 5/10 per ID ?No longer requiring pressor support ? ?Acute respiratory failure with hypoxia and hypercapnia  ?Concern for aspiration event during intubation ?-Acute worsening 5/3 with CXR concerning for new R pna, aspiration vs HAP, repeat CXR 5/5 improved ?-cefepime and linezolid (5/4 - 5/5) due to concern for HAP vs aspiration, narrowed to unasyn 5/5 per ID, plan 7 days ?Chronic severe COPD ?-Does not appear acutely exacerbated ?P: ?Currently tolerating pressure support with 8 over PEEP ?Appears stable HEENT hopeful for possible extubation soon ?Wean PEEP and FiO2 for sats greater than 90%. ?Head of bed elevated 30 degrees. ?Plateau pressures less than 30 cm H20.  ?Follow intermittent chest x-ray and ABG.   ?SAT/SBT as tolerated, mentation preclude extubation  ?Ensure adequate pulmonary hygiene  ?Follow cultures  ?VAP bundle in place  ?PAD protocol ? ?Brief PEA cardiac arrest post intubation ?-Unable to locate duration of arrest time ?Chronic systolic and diastolic congestive heart failure ?-EF of 40 to 45% with grade 1 diastolic dysfunction on echo 08/29/2021 ?A-fib on Eliquis  ?Hx of HTN/HLD ?-Home  medication includes amiodarone, Eliquis, carvedilol, hydralazine, Entresto, spironolactone and pravastatin ?P: ?New Home amiodarone and Eliquis ?Continue to trend hemodynamics ?Continuous telemetry ?Strict intake and output ?Daily weight ?Daily evaluation of need for diuretics ?GDMT as able ? 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 UOP improving with gentle fluids, tube feeds. ?P: ?Nephrology following, appreciate assistance ?Renal functions low improving ?Follow renal function ?Monitor urine output ?Trend be met ?Avoid nephrotoxins ?Ensure adequate renal perfusion ?Hold home Entresto ? ?Acute anemia ?-Likely due to acute illness ?Acute thrombocytopenia ?-Likely in the setting of sepsis  ?P: ?H&H and platelets remain stable ?Continue to trend CBC ?Transfuse per protocol ?Hemoglobin goal greater than 7 ? ?Constipation ?P: ?Continue bowel regiment ?Consider suppository ? ?Best Practice (right click and "Reselect all SmartList Selections" daily)  ? ?Diet/type: tubefeeds ?DVT prophylaxis: DOAC ?GI prophylaxis: PPI ?Lines: N/A ?Foley:  Yes, and it is still needed ?Code Status:  full code ?Last date of multidisciplinary goals of care discussion: Update family daily ? ?Critical care time:  ? ?CRITICAL CARE ?Performed by: Daundre Biel D. Harris ? ?Total critical care time: 37 minutes ? ?Critical care time was exclusive of separately billable procedures and treating other patients. ? ?Critical care was necessary to treat or prevent imminent or life-threatening deterioration. ? ?Critical care was time spent personally by me  on the following activities: development of treatment plan with patient and/or surrogate as well as nursing, discussions with consultants, evaluation of patient's response to treatment, examination of patient, obtaining history from patient or surrogate, ordering and performing treatments and interventions, ordering and  review of laboratory studies, ordering and review of radiographic studies, pulse oximetry and re-evaluation of patient's condition. ? ?Rocsi Hazelbaker D. Kenton Kingfisher, MD ?09/07/21 8:49 AM ?[facility] ? ? ?

## 2021-09-07 NOTE — Progress Notes (Signed)
Inpatient Rehab Admissions Coordinator:  ?Pt remains on the ventilator.  Therapy has signed off. Pt is not appropriate for CIR at this time. AC will sign off. Reconsult when pt has weaned off ventilator and is participating in therapies.   ? ? ? ?Gayland Curry, MS, CCC-SLP ?Admissions Coordinator ?[phone] ? ?

## 2021-09-07 NOTE — Progress Notes (Addendum)
? ?[facility] Infectious Diseases Follow Up Note ? ?Patient Identification: ?Patient Name: Ricky Hayden MRN: [id number] [place] Date: 08/28/2021 11:34 PM ?Age: 76 y.o.Today's Date: 09/07/2021 ? ?Reason for Visit: sepsis, monitor cultures  ? ?Principal Problem: ?  Sepsis ([place]) ?Active Problems: ?  Essential hypertension ?  BPH (benign prostatic hyperplasia) ?  COPD (chronic obstructive pulmonary disease) ([place]) ?  Chronic combined systolic and diastolic CHF, NYHA class 2 ([place]) ?  NICM (nonischemic cardiomyopathy) ([place]) ?  Presence of biventricular cardiac pacemaker ?  UTI (urinary tract infection) ?  ARF (acute renal failure) ([place]) ?  Proteus (mirabilis) ([place]) as the cause of diseases classified elsewhere ?  Shock ([place]) ?  Acute respiratory failure with hypoxia ([place]) ? ? ?Antibiotics: ?Ceftriaxone 4/27-5/1 ?Cefazolin 5/2, Cefepime 5/4 ?Azithromycin  ?Unasyn 5/4-c ?Linezolid 5/3-5/4 ? ?Lines/Hardwares: RT IJ CVC ? ?Interval Events: T max 101. 2. WBC 8.5 ? ? ?Assessment ?Septic shock 2/2 Proteus Complicated UTI/Nephrolithiasis/bacteremia + Possible aspiration PNA - off pressors ? ?Acute metabolic/toxic encephalopathy in the setting of sepsis ?       =EEG 5/4 negative for seizure activity  ? ?Acute Respiratory Failure 2/2 above - tracheal aspirate 5/4 candida albicans is likely a colonizer and non indication to treat ? ?Thrombocytopenia ?AKI on CKD in the setting of ATN from shock/arrest  ?Chronic CHFrEF s/p ICD ?Brief cardiac arrest post intubation ? ?Recommendations ?Continue Unasyn as is ?Monitor CBC and CMP ?Dr Gale Journey to follow from tomorrow ? ?Rest of the management as per the primary team. ?Thank you for the consult. Please page with pertinent questions or concerns. ? ?______________________________________________________________________ ?Subjective ?patient seen and examined at the bedside.  ?Opens eyes but does not follow commands.  ? ?Vitals ?BP (!) 134/49    Pulse 82   Temp 98.9 ?F (37.2 ?C) (Oral)   Resp 16   Ht 5\' 7"  (1.702 m)   Wt 104.9 kg   SpO2 100%   BMI 36.22 kg/m?  ? ?  ?Physical Exam ?Constitutional:  acute on chronically ill appearing elderly male ?   Comments:  on fentanyl 50  ? ?Cardiovascular:  ?   Rate and Rhythm: Normal rate and regular rhythm.  ?   Heart sounds:  ? ?Pulmonary:  ?   Effort: Pulmonary effort is normal on vent, Fio2 40% ?   Comments:  ? ?Abdominal:  ?   Palpations: Abdomen is soft. On tube feeds  ?   Tenderness: non distended and non tender  ? ?Musculoskeletal:     ?   General: No swelling or tenderness.  ? ?Neurological:  ?   General: unable to assess  ? ?Pertinent Microbiology ?Results for orders placed or performed during the hospital encounter of 08/28/21  ?Urine Culture     Status: None  ? Collection Time: 08/28/21 11:38 PM  ? Specimen: In/Out Cath Urine  ?Result Value Ref Range Status  ? Specimen Description IN/OUT CATH URINE  Final  ? Special Requests NONE  Final  ? Culture   Final  ?  NO GROWTH ?Performed at [facility], [place] [address], [place], [place] [address] ?  ? Report Status 08/30/2021 FINAL  Final  ?Blood Culture (routine x 2)     Status: Abnormal  ? Collection Time: 08/28/21 11:45 PM  ? Specimen: BLOOD LEFT FOREARM  ?Result Value Ref Range Status  ? Specimen Description BLOOD LEFT FOREARM  Final  ? Special Requests   Final  ?  BOTTLES DRAWN AEROBIC AND ANAEROBIC Blood Culture adequate volume  ?  Culture  Setup Time   Final  ?  GRAM NEGATIVE RODS ?ANAEROBIC BOTTLE ONLY ?CRITICAL RESULT CALLED TO, READ BACK BY AND VERIFIED WITH: PHARM D J.FRIENS ON [id number] AT 0941 BY E.PARRISH ?Performed at [facility], [place] [address], [place], [place] [address] ?  ? Culture PROTEUS MIRABILIS (A)  Final  ? Report Status 09/02/2021 FINAL  Final  ? Organism ID, Bacteria PROTEUS MIRABILIS  Final  ?    Susceptibility  ? Proteus mirabilis - MIC*  ?  AMPICILLIN <=2 SENSITIVE Sensitive   ?  CEFAZOLIN <=4 SENSITIVE Sensitive   ?   CEFEPIME <=0.12 SENSITIVE Sensitive   ?  CEFTAZIDIME <=1 SENSITIVE Sensitive   ?  CEFTRIAXONE <=0.25 SENSITIVE Sensitive   ?  CIPROFLOXACIN <=0.25 SENSITIVE Sensitive   ?  GENTAMICIN <=1 SENSITIVE Sensitive   ?  IMIPENEM 2 SENSITIVE Sensitive   ?  TRIMETH/SULFA <=20 SENSITIVE Sensitive   ?  AMPICILLIN/SULBACTAM <=2 SENSITIVE Sensitive   ?  PIP/TAZO <=4 SENSITIVE Sensitive   ?  * PROTEUS MIRABILIS  ?Blood Culture ID Panel (Reflexed)     Status: Abnormal  ? Collection Time: 08/28/21 11:45 PM  ?Result Value Ref Range Status  ? Enterococcus faecalis NOT DETECTED NOT DETECTED Final  ? Enterococcus Faecium NOT DETECTED NOT DETECTED Final  ? Listeria monocytogenes NOT DETECTED NOT DETECTED Final  ? Staphylococcus species NOT DETECTED NOT DETECTED Final  ? Staphylococcus aureus (BCID) NOT DETECTED NOT DETECTED Final  ? Staphylococcus epidermidis NOT DETECTED NOT DETECTED Final  ? Staphylococcus lugdunensis NOT DETECTED NOT DETECTED Final  ? Streptococcus species NOT DETECTED NOT DETECTED Final  ? Streptococcus agalactiae NOT DETECTED NOT DETECTED Final  ? Streptococcus pneumoniae NOT DETECTED NOT DETECTED Final  ? Streptococcus pyogenes NOT DETECTED NOT DETECTED Final  ? A.calcoaceticus-baumannii NOT DETECTED NOT DETECTED Final  ? Bacteroides fragilis NOT DETECTED NOT DETECTED Final  ? Enterobacterales DETECTED (A) NOT DETECTED Final  ?  Comment: Enterobacterales represent a large order of gram negative bacteria, not a single organism. ?CRITICAL RESULT CALLED TO, READ BACK BY AND VERIFIED WITH: ?PHARM D J.FRIENS ON [id number] AT 0941 BY E.PARRISH ?  ? Enterobacter cloacae complex NOT DETECTED NOT DETECTED Final  ? Escherichia coli NOT DETECTED NOT DETECTED Final  ? Klebsiella aerogenes NOT DETECTED NOT DETECTED Final  ? Klebsiella oxytoca NOT DETECTED NOT DETECTED Final  ? Klebsiella pneumoniae NOT DETECTED NOT DETECTED Final  ? Proteus species DETECTED (A) NOT DETECTED Final  ?  Comment: CRITICAL RESULT CALLED TO, READ BACK  BY AND VERIFIED WITH: ?PHARM D J.FRIENS ON [id number] AT 0941 BY E.PARRISH ?  ? Salmonella species NOT DETECTED NOT DETECTED Final  ? Serratia marcescens NOT DETECTED NOT DETECTED Final  ? Haemophilus influenzae NOT DETECTED NOT DETECTED Final  ? Neisseria meningitidis NOT DETECTED NOT DETECTED Final  ? Pseudomonas aeruginosa NOT DETECTED NOT DETECTED Final  ? Stenotrophomonas maltophilia NOT DETECTED NOT DETECTED Final  ? Candida albicans NOT DETECTED NOT DETECTED Final  ? Candida auris NOT DETECTED NOT DETECTED Final  ? Candida glabrata NOT DETECTED NOT DETECTED Final  ? Candida krusei NOT DETECTED NOT DETECTED Final  ? Candida parapsilosis NOT DETECTED NOT DETECTED Final  ? Candida tropicalis NOT DETECTED NOT DETECTED Final  ? Cryptococcus neoformans/gattii NOT DETECTED NOT DETECTED Final  ? CTX-M ESBL NOT DETECTED NOT DETECTED Final  ? Carbapenem resistance IMP NOT DETECTED NOT DETECTED Final  ? Carbapenem resistance KPC NOT DETECTED NOT DETECTED Final  ? Carbapenem resistance NDM NOT  DETECTED NOT DETECTED Final  ? Carbapenem resist OXA 48 LIKE NOT DETECTED NOT DETECTED Final  ? Carbapenem resistance VIM NOT DETECTED NOT DETECTED Final  ?  Comment: Performed at [facility], [place] [address] [place], [place] [address]  ?Blood Culture (routine x 2)     Status: None  ? Collection Time: 08/28/21 11:47 PM  ? Specimen: BLOOD  ?Result Value Ref Range Status  ? Specimen Description BLOOD LEFT ANTECUBITAL  Final  ? Special Requests   Final  ?  BOTTLES DRAWN AEROBIC AND ANAEROBIC Blood Culture adequate volume  ? Culture   Final  ?  NO GROWTH 5 DAYS ?Performed at [facility], [place] [address], [place], [place] [address] ?  ? Report Status 09/03/2021 FINAL  Final  ?Resp Panel by RT-PCR (Flu A&B, Covid) Nasopharyngeal Swab     Status: None  ? Collection Time: 08/29/21 12:13 AM  ? Specimen: Nasopharyngeal Swab; Nasopharyngeal(NP) swabs in vial transport medium  ?Result Value Ref Range Status  ? SARS Coronavirus 2 by  RT PCR NEGATIVE NEGATIVE Final  ?  Comment: (NOTE) ?SARS-CoV-2 target nucleic acids are NOT DETECTED. ? ?The SARS-CoV-2 RNA is generally detectable in upper respiratory ?specimens during the acute phase of infe

## 2021-09-07 NOTE — Progress Notes (Addendum)
eLink Physician-Brief Progress Note ?Patient Name: Ricky Hayden ?DOB: 1946/01/29 ?MRN: [id number] ? ? ?Date of Service ? 09/07/2021  ?HPI/Events of Note ? Nursing reports JVD, CVP increased from 8-10 to 14 and crackles at the bases. Creatinine = 3.45.   ?eICU Interventions ? Plan: ?Lasix 80 mg IV X 1 now.   ? ? ? ?Intervention Category ?Major Interventions: Other: ? ?Cristhian Vanhook Cornelia Copa ?09/07/2021, 2:30 AM ?

## 2021-09-08 DIAGNOSIS — N390 Urinary tract infection, site not specified: Secondary | ICD-10-CM | POA: Diagnosis not present

## 2021-09-08 DIAGNOSIS — R7881 Bacteremia: Secondary | ICD-10-CM | POA: Diagnosis not present

## 2021-09-08 DIAGNOSIS — J9601 Acute respiratory failure with hypoxia: Secondary | ICD-10-CM | POA: Diagnosis not present

## 2021-09-08 DIAGNOSIS — N3001 Acute cystitis with hematuria: Secondary | ICD-10-CM | POA: Diagnosis not present

## 2021-09-08 DIAGNOSIS — A419 Sepsis, unspecified organism: Secondary | ICD-10-CM | POA: Diagnosis not present

## 2021-09-08 DIAGNOSIS — B964 Proteus (mirabilis) (morganii) as the cause of diseases classified elsewhere: Secondary | ICD-10-CM | POA: Diagnosis not present

## 2021-09-08 LAB — POCT I-STAT 7, (LYTES, BLD GAS, ICA,H+H)
Acid-Base Excess: 5 mmol/L — ABNORMAL HIGH (ref 0.0–2.0)
Bicarbonate: 30.6 mmol/L — ABNORMAL HIGH (ref 20.0–28.0)
Calcium, Ion: 1.26 mmol/L (ref 1.15–1.40)
HCT: 32 % — ABNORMAL LOW (ref 39.0–52.0)
Hemoglobin: 10.9 g/dL — ABNORMAL LOW (ref 13.0–17.0)
O2 Saturation: 91 %
Patient temperature: 98.4
Potassium: 5 mmol/L (ref 3.5–5.1)
Sodium: 146 mmol/L — ABNORMAL HIGH (ref 135–145)
TCO2: 32 mmol/L (ref 22–32)
pCO2 arterial: 50.2 mmHg — ABNORMAL HIGH (ref 32–48)
pH, Arterial: 7.392 (ref 7.35–7.45)
pO2, Arterial: 62 mmHg — ABNORMAL LOW (ref 83–108)

## 2021-09-08 LAB — CBC
HCT: 30.2 % — ABNORMAL LOW (ref 39.0–52.0)
Hemoglobin: 9.3 g/dL — ABNORMAL LOW (ref 13.0–17.0)
MCH: 30.7 pg (ref 26.0–34.0)
MCHC: 30.8 g/dL (ref 30.0–36.0)
MCV: 99.7 fL (ref 80.0–100.0)
Platelets: 150 10*3/uL (ref 150–400)
RBC: 3.03 MIL/uL — ABNORMAL LOW (ref 4.22–5.81)
RDW: 14.1 % (ref 11.5–15.5)
WBC: 9.9 10*3/uL (ref 4.0–10.5)
nRBC: 0 % (ref 0.0–0.2)

## 2021-09-08 LAB — RENAL FUNCTION PANEL
Albumin: 2 g/dL — ABNORMAL LOW (ref 3.5–5.0)
Anion gap: 6 (ref 5–15)
BUN: 90 mg/dL — ABNORMAL HIGH (ref 8–23)
CO2: 31 mmol/L (ref 22–32)
Calcium: 8.7 mg/dL — ABNORMAL LOW (ref 8.9–10.3)
Chloride: 110 mmol/L (ref 98–111)
Creatinine, Ser: 3.45 mg/dL — ABNORMAL HIGH (ref 0.61–1.24)
GFR, Estimated: 18 mL/min — ABNORMAL LOW (ref >60)
Glucose, Bld: 165 mg/dL — ABNORMAL HIGH (ref 70–99)
Phosphorus: 4.3 mg/dL (ref 2.5–4.6)
Potassium: 5 mmol/L (ref 3.5–5.1)
Sodium: 147 mmol/L — ABNORMAL HIGH (ref 135–145)

## 2021-09-08 LAB — GLUCOSE, CAPILLARY
Glucose-Capillary: 149 mg/dL — ABNORMAL HIGH (ref 70–99)
Glucose-Capillary: 127 mg/dL — ABNORMAL HIGH (ref 70–99)
Glucose-Capillary: 171 mg/dL — ABNORMAL HIGH (ref 70–99)
Glucose-Capillary: 194 mg/dL — ABNORMAL HIGH (ref 70–99)
Glucose-Capillary: 165 mg/dL — ABNORMAL HIGH (ref 70–99)
Glucose-Capillary: 179 mg/dL — ABNORMAL HIGH (ref 70–99)

## 2021-09-08 MED ORDER — FREE WATER
250.0000 mL | Status: DC
  Administered 2021-09-08 – 2021-09-12 (×24): 250 mL

## 2021-09-08 MED ORDER — DEXMEDETOMIDINE HCL IN NACL 400 MCG/100ML IV SOLN
0.0000 ug/kg/h | INTRAVENOUS | Status: AC
  Administered 2021-09-08: 0.5 ug/kg/h via INTRAVENOUS
  Administered 2021-09-08: 0.4 ug/kg/h via INTRAVENOUS
  Administered 2021-09-09: 0.5 ug/kg/h via INTRAVENOUS
  Administered 2021-09-09: 0.4 ug/kg/h via INTRAVENOUS
  Administered 2021-09-09 (×2): 0.5 ug/kg/h via INTRAVENOUS
  Administered 2021-09-10: 0.4 ug/kg/h via INTRAVENOUS
  Administered 2021-09-10: 0.5 ug/kg/h via INTRAVENOUS
  Administered 2021-09-10: 0.4 ug/kg/h via INTRAVENOUS
  Administered 2021-09-11: 0.5 ug/kg/h via INTRAVENOUS
  Filled 2021-09-08 (×11): qty 100

## 2021-09-08 MED ORDER — METOLAZONE 5 MG PO TABS
2.5000 mg | ORAL_TABLET | Freq: Once | ORAL | Status: AC
  Administered 2021-09-08: 2.5 mg
  Filled 2021-09-08: qty 1

## 2021-09-08 MED ORDER — SODIUM CHLORIDE 0.9 % IV SOLN
3.0000 g | Freq: Two times a day (BID) | INTRAVENOUS | Status: AC
  Administered 2021-09-08: 3 g via INTRAVENOUS
  Filled 2021-09-08: qty 8

## 2021-09-08 MED ORDER — SODIUM CHLORIDE 0.9 % IV SOLN
2.0000 g | INTRAVENOUS | Status: AC
  Administered 2021-09-08 – 2021-09-10 (×3): 2 g via INTRAVENOUS
  Filled 2021-09-08 (×3): qty 20

## 2021-09-08 MED ORDER — FUROSEMIDE 10 MG/ML IJ SOLN
120.0000 mg | Freq: Once | INTRAVENOUS | Status: AC
  Administered 2021-09-08: 120 mg via INTRAVENOUS
  Filled 2021-09-08: qty 10

## 2021-09-08 NOTE — Assessment & Plan Note (Addendum)
Edema has improved, elevated CVP at 14 but BNP negative. ? ?-Hold further diuresis as volume status equivocal. ?

## 2021-09-08 NOTE — TOC Progression Note (Signed)
Transition of Care (TOC) - Initial/Assessment Note  ? ? ?Patient Details  ?Name: Ricky Hayden ?MRN: [id number] ?Date of Birth: 1946/03/22 ? ?Transition of Care (TOC) CM/SW Contact:    ?Paulene Floor Nickolus Wadding, LCSWA ?Phone Number: ?09/08/2021, 2:04 PM ? ?Clinical Narrative:                 ?CSW met with the patient's daughter, Ricky Hayden, at bedside.  The patient lived home alone with his adult children staying evenings/nights prior to this admission.  PT and OT are recommending CIR.  When the patient is able, the family is open to CIR.  Should CIR no longer be an option, the family would like for the patient to go home with home health.  The patient does not have anyone with him during the daytime hours and the daughter asked if the [place] could provide someone during the day should CIR not accept.  The patient remains on the ventilator at this time. ? ?CSW attempted to contact [facility], [phone] ext. [phone], the patient's social worker with the [place], to inquire about long term benefits.  The number just kept ringing with no answer or VM.   ? ?TOC will continue to follow. ? ?  ?  ? ? ?Patient Goals and CMS Choice ?  ?  ?  ? ?Expected Discharge Plan and Services ?  ?  ?  ?  ?  ?                ?  ?  ?  ?  ?  ?  ?  ?  ?  ?  ? ?Prior Living Arrangements/Services ?  ?  ?  ?       ?  ?  ?  ?  ? ?Activities of Daily Living ?Home Assistive Devices/Equipment: None ?ADL Screening (condition at time of admission) ?Patient's cognitive ability adequate to safely complete daily activities?: Yes ?Is the patient deaf or have difficulty hearing?: No ?Does the patient have difficulty seeing, even when wearing glasses/contacts?: No ?Does the patient have difficulty concentrating, remembering, or making decisions?: No ?Patient able to express need for assistance with ADLs?: Yes ?Does the patient have difficulty dressing or bathing?: No ?Independently performs ADLs?: Yes (appropriate for developmental age) ?Does the patient have difficulty  walking or climbing stairs?: No ?Weakness of Legs: None ?Weakness of Arms/Hands: None ? ?Permission Sought/Granted ?  ?  ?   ?   ?   ?   ? ?Emotional Assessment ?  ?  ?  ?  ?  ?  ? ?Admission diagnosis:  Encephalopathy [G93.40] ?Acute respiratory failure with hypoxia ([place]) [J96.01] ?Sepsis ([place]) [A41.9] ?Urinary tract infection with hematuria, site unspecified [N39.0, R31.9] ?Patient Active Problem List  ? Diagnosis Date Noted  ? Bacteremia due to Gram-negative bacteria   ? Complicated UTI (urinary tract infection)   ? Acute respiratory failure with hypoxia ([place])   ? Septic shock ([place])   ? Proteus (mirabilis) (morganii) as the cause of diseases classified elsewhere 09/02/2021  ? ARF (acute renal failure) ([place]) 08/29/2021  ? Presence of biventricular cardiac pacemaker 05/15/2020  ? PVC (premature ventricular contraction) 12/11/2019  ? NICM (nonischemic cardiomyopathy) ([place]) 12/11/2019  ? CKD (chronic kidney disease) stage 3, GFR 30-59 ml/min (HCC) 08/12/2019  ? Hyperkalemia   ? Chronic combined systolic and diastolic CHF, NYHA class 2 ([place]) 06/10/2015  ? Leg swelling 07/16/2014  ? Preventative health care 03/07/2014  ? COPD (chronic obstructive pulmonary disease) ([place]) 09/11/2013  ? BPPV (  benign paroxysmal positional vertigo) 10/05/2012  ? BPH (benign prostatic hyperplasia) 01/10/2008  ? Gout 04/22/2006  ? ERECTILE DYSFUNCTION 04/22/2006  ? 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 Hyperlipidemia 03/15/2006  ? Essential hypertension 03/15/2006  ? GERD 03/15/2006  ? HIATAL HERNIA 03/15/2006  ? OSTEOARTHRITIS 03/15/2006  ? Traumatic brain injury ([place]) 03/15/2006  ? ?PCP:  [facility] ?Pharmacy:   ?[facility], [address] ?[phone] ?[place] [address] ?Phone: [phone] Fax: [phone] ? ?[facility] ?[address] ?[place] [address] ?Phone: [phone] Fax: [phone] ? ? ? ? ?Social  Determinants of Health (SDOH) Interventions ?  ? ?Readmission Risk Interventions ? ?  08/18/2019  ? 11:08 AM 08/17/2019  ?  3:41 PM  ?Readmission Risk Prevention Plan  ?Transportation Screening Complete Complete  ?PCP or Specialist Appt within 5-7 Days  Complete  ?PCP or Specialist Appt within 3-5 Days Complete   ?Home Care Screening  Complete  ?Medication Review (RN CM)  Complete  ?[place] or Home Care Consult Complete   ?Social Work Consult for [place] Planning/Counseling Complete   ?Palliative Care Screening Complete   ?Medication Review Press photographer) Complete   ? ? ? ?

## 2021-09-08 NOTE — Progress Notes (Signed)
Patient ID: Ricky Hayden, male   DOB: 1945/08/29, 76 y.o.   MRN: [id number] ?[facility] ?Progress Note  ? ?Assessment/ Plan:   ?1.  Acute kidney injury on chronic kidney disease stage III:  ATN associated from severe urinary tract infection with sepsis/shock. ?- BUN is climbing but hopeful Cr has plateaued    ?- no acute indication for renal replacement therapy  ?- continue supportive care - defer diuretic today as below ? ?2.  Severe urinary tract infection with sepsis: With evidence of Proteus urinary tract infection as well as bacteremia for which he is currently on antibiotic therapy with Unasyn.  Per ID  ? ?3.  Acute hypoxic/hypercarbic respiratory failure: Suspected to have had an aspiration event and on coverage for HCAP. ? ?4. CKD stage 3 ?- noted; pre-dates admission  ? ?5.  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 ?6.  Hypernatremia: Secondary to insensible losses/decreased intake, improved a bit with increased free water.  Adjust to 250 ml every 4 hours (down from 400) to improve tolerance) ? ?Disposition - per primary team  ? ?Subjective:   ? he had 2.7 liters UOP over 5/7.  Per nursing he just got some sedation as he was agitated with an attempted wean.  ? ?Review of systems: unable to obtain 2/2 intubation and sedation   ? ?Objective:   ?BP 110/76   Pulse 90   Temp 98.4 ?F (36.9 ?C) (Oral)   Resp (!) 27   Ht _0  (1.702 m)   Wt 109.5 kg   SpO2 100%   BMI 37.81 kg/m?  ? ?Intake/Output Summary (Last 24 hours) at 09/08/2021 0749 ?Last data filed at 09/08/2021 0600 ?Gross per 24 hour  ?Intake 5315.33 ml  ?Output 2720 ml  ?Net 2595.33 ml  ? ?Weight change: 4.6 kg ? ?Physical Exam:   ?Gen: elderly male intubated ?CVS: S1S2 ; no rub ?HEENT - NCAT  ?Neck trachea midline  ?Resp: clear anteriorly on vent; FIO2 40 and PEEP 5 ?Abd: Soft, obese, nontender,distended with obese habitus ?Ext: no pitting lower extremity edema  ?Neuro - just received  sedation bolus per nursing; slight agitation noted  ? ?Imaging: ?No results found. ? ?Labs: ?BMET ?Recent Labs  ?Lab 09/03/21 ?0850 09/04/21 ?0206 09/04/21 ?1116 09/04/21 ?1451 09/05/21 ?2585 09/06/21 ?0800 09/07/21 ?2778 09/07/21 ?2150 09/08/21 ?2423  ?NA 143 141  --   --  145 149* 152* 145 147*  ?K 4.8 4.3  --  4.3 4.6 4.2 4.8 4.9 5.0  ?CL 103 106  --   --  107 117* 115* 111 110  ?CO2 28 28  --   --  _1 ?GLUCOSE 308* 155*  --   --  153* 184* 168* 197* 165*  ?BUN 68* 80*  --   --  92* 88* 85* 85* 90*  ?CREATININE 3.46* 3.55*  --   --  3.75* 3.45* 3.39* 3.26* 3.45*  ?CALCIUM 8.8* 8.6*  --   --  8.8* 8.2* 8.8* 8.3* 8.7*  ?PHOS 5.6*  --  2.4* 2.5 3.4 3.2 3.9  --  4.3  ? ?CBC ?Recent Labs  ?Lab 09/03/21 ?0850 09/04/21 ?0206 09/05/21 ?5361 09/06/21 ?0510 09/07/21 ?4431 09/08/21 ?5400  ?WBC 17.7*   < > 9.4 9.2 8.5 9.9  ?NEUTROABS 12.9*  --   --   --   --   --   ?HGB 13.3   < > 11.4* 9.5* 9.4* 9.3*  ?  HCT 41.9   < > 35.5* 30.1* 30.9* 30.2*  ?MCV 97.0   < > 97.5 96.8 99.7 99.7  ?PLT 109*   < > 105* 110* 127* 150  ? < > = values in this interval not displayed.  ? ?Medications:   ? ? amiodarone  200 mg Per Tube Daily  ? apixaban  5 mg Per Tube BID  ? bisacodyl  10 mg Rectal Once  ? chlorhexidine gluconate (MEDLINE KIT)  15 mL Mouth Rinse BID  ? Chlorhexidine Gluconate Cloth  6 each Topical Daily  ? docusate  100 mg Per Tube BID  ? feeding supplement (PROSource TF)  45 mL Per Tube BID  ? free water  400 mL Per Tube Q4H  ? insulin aspart  0-15 Units Subcutaneous Q4H  ? insulin glargine-yfgn  10 Units Subcutaneous Daily  ? ipratropium-albuterol  3 mL Nebulization Q6H  ? mouth rinse  15 mL Mouth Rinse 10 times per day  ? pantoprazole sodium  40 mg Per Tube Daily  ? PHENobarbital  64.8 mg Per Tube BID  ? polyethylene glycol  17 g Per Tube BID  ? pravastatin  40 mg Per Tube QPM  ? senna  1 tablet Per Tube BID  ? sodium chloride flush  10-40 mL Intracatheter Q12H  ? ?Claudia Desanctis, MD ?09/08/2021, 8:01 AM  ? ?

## 2021-09-08 NOTE — Progress Notes (Addendum)
? ?[facility] Infectious Diseases Follow Up Note ? ?Patient Identification: ?Patient Name: Ricky Hayden MRN: [id number] [place] Date: 08/28/2021 11:34 PM ?Age: 76 y.o.Today's Date: 09/08/2021 ? ?Reason for Visit: sepsis, monitor cultures  ? ?Active Problems: ?  BPH (benign prostatic hyperplasia) ?  COPD (chronic obstructive pulmonary disease) ([place]) ?  Chronic combined systolic and diastolic CHF, NYHA class 2 ([place]) ?  CKD (chronic kidney disease) stage 3, GFR 30-59 ml/min (HCC) ?  NICM (nonischemic cardiomyopathy) ([place]) ?  ARF (acute renal failure) ([place]) ?  Proteus (mirabilis) (morganii) as the cause of diseases classified elsewhere ?  Septic shock ([place]) ?  Acute respiratory failure with hypoxia ([place]) ?  Bacteremia due to Gram-negative bacteria ?  Complicated UTI (urinary tract infection) ? ? ?Antibiotics: ?Ceftriaxone 4/27-5/1 ?Cefazolin 5/2, Cefepime 5/4 ?Azithromycin  ?Unasyn 5/4-c ?Linezolid 5/3-5/4 ? ?Lines/Hardwares: 5/03-c RT IJ CVC ? ?Interval Events/subjective:  ?Afebrile but after I visited him he spiked fever to 103 ?Per daughter he is mentating better ?Wbc remains normal ?Thrombocytopenia resolved ? ?Aki slightly worse in setting diuresis ? ?Remains intubated ? ? ?Assessment ?Septic shock 2/2 Proteus Complicated UTI/Nephrolithiasis/bacteremia + Possible aspiration PNA - off pressors. Fever off and on ?aspiration pneumonitis vs occult bacteremia? Vent setting stable with diuresis. I don't find much evidence to support drug fever but possible (without rash/leukcoytosis/eosinophilia) ? ?-repeat bcx ?-would finish 2 more days antibiotics amp-sulbactam --> ceftriaxone ?-if no indication would remove ij ? ? ?Acute metabolic/toxic encephalopathy ?       =EEG 5/4 negative for seizure activity  ? ?Acute Respiratory Failure 2/2 above - tracheal aspirate 5/4 candida albicans is likely a colonizer and non indication to treat. Primary team working on volume  control ? ?Thrombocytopenia ?AKI on CKD in the setting of ATN from shock/arrest  ?Chronic CHFrEF s/p ICD ?Brief cardiac arrest post intubation ? ?Recommendations ?Continue amp-sulb but switch to ceftriaxone today, until 5/10 ?Repeat blood cx today ?Remove ij if no indication ?Discussed with primary team ? ? ? ?Vitals ?BP (!) 130/48   Pulse 71   Temp (!) 103.2 ?F (39.6 ?C) (Oral)   Resp (!) 25   Ht 5\' 7"  (1.702 m)   Wt 109.5 kg   SpO2 100%   BMI 37.81 kg/m?  ? ?  ?Physical Exam ?General/constitutional: no distress, ill appearing; eyes open spontaneously and tracking; ett intact ?HEENT: Normocephalic, PER, Conj Clear ?Neck supple -- right sided ij cvc in place no erythema/swelling ?CV: rrr no mrg ?Lungs: clear to auscultation on vent ?Abd: Soft, Nontender ?Ext: no edema ?Skin: No Rash ?Neuro: generalized weakness ?MSK: no peripheral joint swelling/tenderness/warmth ? ? ?Central line presence: right ij no erythema/fluctuance ? ? ?Pertinent Microbiology ?Results for orders placed or performed during the hospital encounter of 08/28/21  ?Urine Culture     Status: None  ? Collection Time: 08/28/21 11:38 PM  ? Specimen: In/Out Cath Urine  ?Result Value Ref Range Status  ? Specimen Description IN/OUT CATH URINE  Final  ? Special Requests NONE  Final  ? Culture   Final  ?  NO GROWTH ?Performed at [facility], [place] [address], [place], [place] [address] ?  ? Report Status 08/30/2021 FINAL  Final  ?Blood Culture (routine x 2)     Status: Abnormal  ? Collection Time: 08/28/21 11:45 PM  ? Specimen: BLOOD LEFT FOREARM  ?Result Value Ref Range Status  ? Specimen Description BLOOD LEFT FOREARM  Final  ? Special Requests   Final  ?  BOTTLES DRAWN AEROBIC  AND ANAEROBIC Blood Culture adequate volume  ? Culture  Setup Time   Final  ?  GRAM NEGATIVE RODS ?ANAEROBIC BOTTLE ONLY ?CRITICAL RESULT CALLED TO, READ BACK BY AND VERIFIED WITH: PHARM D J.FRIENS ON [id number] AT 0941 BY E.PARRISH ?Performed at [facility],  [place] [address] [place], [place] [address] ?  ? Culture PROTEUS MIRABILIS (A)  Final  ? Report Status 09/02/2021 FINAL  Final  ? Organism ID, Bacteria PROTEUS MIRABILIS  Final  ?    Susceptibility  ? Proteus mirabilis - MIC*  ?  AMPICILLIN <=2 SENSITIVE Sensitive   ?  CEFAZOLIN <=4 SENSITIVE Sensitive   ?  CEFEPIME <=0.12 SENSITIVE Sensitive   ?  CEFTAZIDIME <=1 SENSITIVE Sensitive   ?  CEFTRIAXONE <=0.25 SENSITIVE Sensitive   ?  CIPROFLOXACIN <=0.25 SENSITIVE Sensitive   ?  GENTAMICIN <=1 SENSITIVE Sensitive   ?  IMIPENEM 2 SENSITIVE Sensitive   ?  TRIMETH/SULFA <=20 SENSITIVE Sensitive   ?  AMPICILLIN/SULBACTAM <=2 SENSITIVE Sensitive   ?  PIP/TAZO <=4 SENSITIVE Sensitive   ?  * PROTEUS MIRABILIS  ?Blood Culture ID Panel (Reflexed)     Status: Abnormal  ? Collection Time: 08/28/21 11:45 PM  ?Result Value Ref Range Status  ? Enterococcus faecalis NOT DETECTED NOT DETECTED Final  ? Enterococcus Faecium NOT DETECTED NOT DETECTED Final  ? Listeria monocytogenes NOT DETECTED NOT DETECTED Final  ? Staphylococcus species NOT DETECTED NOT DETECTED Final  ? Staphylococcus aureus (BCID) NOT DETECTED NOT DETECTED Final  ? Staphylococcus epidermidis NOT DETECTED NOT DETECTED Final  ? Staphylococcus lugdunensis NOT DETECTED NOT DETECTED Final  ? Streptococcus species NOT DETECTED NOT DETECTED Final  ? Streptococcus agalactiae NOT DETECTED NOT DETECTED Final  ? Streptococcus pneumoniae NOT DETECTED NOT DETECTED Final  ? Streptococcus pyogenes NOT DETECTED NOT DETECTED Final  ? A.calcoaceticus-baumannii NOT DETECTED NOT DETECTED Final  ? Bacteroides fragilis NOT DETECTED NOT DETECTED Final  ? Enterobacterales DETECTED (A) NOT DETECTED Final  ?  Comment: Enterobacterales represent a large order of gram negative bacteria, not a single organism. ?CRITICAL RESULT CALLED TO, READ BACK BY AND VERIFIED WITH: ?PHARM D J.FRIENS ON [id number] AT 0941 BY E.PARRISH ?  ? Enterobacter cloacae complex NOT DETECTED NOT DETECTED Final  ? Escherichia  coli NOT DETECTED NOT DETECTED Final  ? Klebsiella aerogenes NOT DETECTED NOT DETECTED Final  ? Klebsiella oxytoca NOT DETECTED NOT DETECTED Final  ? Klebsiella pneumoniae NOT DETECTED NOT DETECTED Final  ? Proteus species DETECTED (A) NOT DETECTED Final  ?  Comment: CRITICAL RESULT CALLED TO, READ BACK BY AND VERIFIED WITH: ?PHARM D J.FRIENS ON [phone] AT 0941 BY E.PARRISH ?  ? Salmonella species NOT DETECTED NOT DETECTED Final  ? Serratia marcescens NOT DETECTED NOT DETECTED Final  ? Haemophilus influenzae NOT DETECTED NOT DETECTED Final  ? Neisseria meningitidis NOT DETECTED NOT DETECTED Final  ? Pseudomonas aeruginosa NOT DETECTED NOT DETECTED Final  ? Stenotrophomonas maltophilia NOT DETECTED NOT DETECTED Final  ? Candida albicans NOT DETECTED NOT DETECTED Final  ? Candida auris NOT DETECTED NOT DETECTED Final  ? Candida glabrata NOT DETECTED NOT DETECTED Final  ? Candida krusei NOT DETECTED NOT DETECTED Final  ? Candida parapsilosis NOT DETECTED NOT DETECTED Final  ? Candida tropicalis NOT DETECTED NOT DETECTED Final  ? Cryptococcus neoformans/gattii NOT DETECTED NOT DETECTED Final  ? CTX-M ESBL NOT DETECTED NOT DETECTED Final  ? Carbapenem resistance IMP NOT DETECTED NOT DETECTED Final  ? Carbapenem resistance KPC NOT DETECTED NOT  DETECTED Final  ? Carbapenem resistance NDM NOT DETECTED NOT DETECTED Final  ? Carbapenem resist OXA 48 LIKE NOT DETECTED NOT DETECTED Final  ? Carbapenem resistance VIM NOT DETECTED NOT DETECTED Final  ?  Comment: Performed at [facility], [place] [address] [place], [place] [address]  ?Blood Culture (routine x 2)     Status: None  ? Collection Time: 08/28/21 11:47 PM  ? Specimen: BLOOD  ?Result Value Ref Range Status  ? Specimen Description BLOOD LEFT ANTECUBITAL  Final  ? Special Requests   Final  ?  BOTTLES DRAWN AEROBIC AND ANAEROBIC Blood Culture adequate volume  ? Culture   Final  ?  NO GROWTH 5 DAYS ?Performed at [facility], [place] [address], [place], [place]  [address] ?  ? Report Status 09/03/2021 FINAL  Final  ?Resp Panel by RT-PCR (Flu A&B, Covid) Nasopharyngeal Swab     Status: None  ? Collection Time: 08/29/21 12:13 AM  ? Specimen: Nasopharyngeal Swab; Nasopharyngeal(NP) swabs

## 2021-09-08 NOTE — Progress Notes (Signed)
? ?NAME:  Ricky Hayden, MRN:  [id number], DOB:  Oct 03, 1945, LOS: 10 ?ADMISSION DATE:  08/28/2021, CONSULTATION DATE:  5/2 ?REFERRING MD:  Dr. Avon Gully, CHIEF COMPLAINT:  Acute respiratory failure  ? ?History of Present Illness:  ?History obtained from chart review and daughter [name] at bedside. ? ?Mr. Sissel is a 76 yo gentleman w/ pertinent PMH of chronic HFrEF with ICD, a-fib, CKD stage IIIb, COPD who presented to [facility] on 5/2 w/ hematuria x 3 weeks. The week prior to admission he was started on Bactrim for urine cultures showing proteus mirabilis. He began having worsening fever and SOB and was brought to [facility] ED on 4/27.  ? ?At admission he was initially started on BiPAP for SOB. Fever 103 F. Started on empiric abx. CT abdomen showing nonobstructive nephrolithiasis. Urology consulted with no indication for procedure.  Further work up showing sepsis from UTI w/ pyelonephritis. Blood cultures were positive for proteus mirabilis. On Ancef with ID managing antibiotics.  ? ?On 5/2 he began having decreased LOC and increased O2 requirements. Per his RN this began around 11AM and they waited for 2 ABG results before he was placed on BiPAP. Once he was on Bipap he had improvement in saturations. There was concern this was due to volume overload but concerning to give lasix with worsening kidney function.  Abg 7.3/ 61/ 61/ 30. PCCM consulted for hypoxia evaluation and management. ? ?Pertinent  Medical History  ? 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 ?Significant Hospital Events: ?Including procedures, antibiotic start and stop dates in addition to other pertinent events   ?4/27: admitted to [facility] for sepsis 2/2 UTI, started ceftriaxone and azithro ?4/27-5/1 on ceftriaxone ?5/2:  antibiotics deescalated to cefazolin. PCCM consult for hypoxia. Placed on BIPAP ?5/3: Bipap  all night, worsening mental status and hypoxemia, intubated, short PEA arrest after intubation, hypotensive, central line placed, CXR in AM clear, CXR after intuabntion diffuse R sided infiltrates concerning for aspiration vs HAP ?5/4 stable pressors, o2 weaned, Cr elevated, Uop diminished but not oliguric,  some flexure posturing, EEG ordered - negative ?5/5 pressor off, minimal vent settings, seems to follow commands, UOP improved, nephrology consult at family request ?5/6 no acute complications overnight currently tolerating vent wean but requiring 12 pressure repeat.  Renal function this a.m. pending ?5/7 patient received 80 mg IV Lasix overnight due to increased CVP and crackles to bases ? ?Interim History / Subjective:  ?Opens eyes to verbal stimuli, follows commands to wiggle toes and initiates movement to lift arms off bed. Grimaces to pressure on L chest.  ? ?Objective   ?Blood pressure (!) 163/78, pulse 73, temperature 98.4 ?F (36.9 ?C), temperature source Oral, resp. rate (!) 24, height 5\' 7"  (1.702 m), weight 109.5 kg, SpO2 100 %. ?   ?Vent Mode: PRVC ?FiO2 (%):  [40 %] 40 % ?Set Rate:  [22 bmp] 22 bmp ?Vt Set:  [520 mL] 520 mL ?PEEP:  [5 cmH20] 5 cmH20 ?Pressure Support:  [8 cmH20] 8 cmH20 ?Plateau Pressure:  [20 cmH20-22 cmH20] 20 cmH20  ? ?Intake/Output Summary (Last 24 hours) at 09/08/2021 0836 ?Last data filed at 09/08/2021 0600 ?Gross per 24 hour  ?Intake 5245.33 ml  ?Output 2420 ml  ?Net 2825.33 ml  ? ? ?Filed Weights  ? 09/06/21 0500 09/07/21 0500 09/08/21 0400  ?Weight: 106.6 kg 104.9 kg 109.5 kg  ? ? ?  Examination: ?General: Acute on chronic ill-appearing obese elderly male lying in bed in no acute distress ?HEENT: ETT, MM pink/moist, PERRL,  ?Neuro: Opens eyes to verbal stimuli, able to follow commands ?CV: regular rate and rhythm ?PULM: Coarse breath sounds bilaterally, on PRVC ?GI: soft, bowel sounds present, non-tender, non-distended, tolerating TF ?Extremities: warm/dry, trace edema  ?Skin: no  rashes or lesions ? ?Resolved Hospital Problem list   ?Sepsis due to Proteus pyelonephritis ? ?Assessment & Plan:  ? ?Acute respiratory failure with hypoxia and hypercapnia  Concern for aspiration event during intubation  Chronic severe COPD ?Patient reportedly weaned yesterday for majority of the day, but this morning PSV was stopped due to agitation. Appears comfortable on 5 of PEEP today. Exam significant for coarse breath sounds bilaterally without wheeze. Respiratory culture with few candida, no other growth. Labs this morning notable for hypoxemia to 62, hypercapnia to 50 suggesting less than adequate oxygenation. Etiologies for failure to wean include cardiac, respiratory and nutritional causes. In this patient, most likely cause is fluid overload secondary to HFrEF. Aspiration is a less likely cause given normal white count, lack of fever. Will plan to continue diuresis to address likely pulmonary edema and restart time-limited SBTs tomorrow.  ?- IV lasix 120 mg, metolazone 1.5 mg per tube ?- Monitor UOP ?- Continue duonebs ? ?AKI on chronic kidney disease stage IIIb ?Creatinine increased from 3.26 to 3.45, suggesting AKI however still improved from earlier in hospitalization. Patient's baseline was 1.47 as of January 2023, however appears to have been increasing over the past few months. Differential for AKI in this patient includes venous congestion due to volume overload 2/2 HFrEF vs pre-renal etiology from diuretic use. Given his hypertension, net positive fluid status, and elevated JVP of 14, venous congestion is more likely. Therefore, will continue to diuresis with a net negative goal and expect this to improve kidney function. If creatinine continues to rise, will plan to reevaluate fluid status.  ?- Diuresis as above ?- Strict I/Os, daily weights ?- Nephrology following, appreciate assistance ? ?Brief PEA cardiac arrest post intubation  Chronic systolic and diastolic congestive heart failure   A-fib on Eliquis  Hx of HTN/HLD ?Likely volume overloaded given JVP of 14, hypertension and net positive fluid status. Will diuresis as above. ?- Diuresis as above ?- Continue home amiodarone 200 mg daily ?- Continue home eliquis 5 mg BID ?- Hold home carvedilol, hydralazine, Entresto, spironolactone and pravastatin ? ?Acute metabolic encephalopathy ?Patient with waxing and waning alertness, more suggestive of delirium associated with acute illness rather than permanent brain damage from PEA arrest. Will plan to limit sedation while providing appropriate pain management and continue to monitor. ?- start precedex drip for better pain control ?- continue fentanyl 25 mg every 30 PRN ? ?Hx of brain surgery, on seizure prophylaxis ?Spot EEG 5/4 negative for seizure activity ?- Continue home phenobarbital 63.8 mg BID ? ?Sepsis due to Proteus pyelonephritis and bacteremia with additional concern for aspiration pneumonia (resolved) ?Blood pressures have normalized off pressors, patient has been afebrile. Will continue treating UTI with unasin through 5/10 per ID ?- Unasyn 3g every 12 hours through 5/10 ? ?Anemia  Thrombocytopenia ?Likely in the setting of sepsis, improving  ?- Daily CBC ?- Transfuse for hgb <7 ? ?Constipation ?- Continue miralax, senna, colace ? ?Best Practice (right click and "Reselect all SmartList Selections" daily)  ? ?Diet/type: tubefeeds ?DVT prophylaxis: DOAC ?GI prophylaxis: PPI ?Lines: N/A ?Foley:  Yes, and it is still needed ?Code Status:  full  code ?Last date of multidisciplinary goals of care discussion: Update family daily ? ?Critical care time:  ? ?Excell Seltzer, MS4 ? ?

## 2021-09-08 NOTE — Assessment & Plan Note (Signed)
On finasteride at home ? ?-Start doxazosin and Urecholine for possible urinary retention ?

## 2021-09-08 NOTE — Assessment & Plan Note (Signed)
Due to Proteus mirabilis urinary tract infection related to BPH. ?Off vasopressors. ? ?-Complete 10-day course of antibiotics. ?

## 2021-09-08 NOTE — Assessment & Plan Note (Addendum)
Mixed picture.  Improving off diuretics. ?

## 2021-09-08 NOTE — Progress Notes (Signed)
Fever spiked 101 at 1230 gave tylenol at 3pm temp was 103 added ice packs ?

## 2021-09-08 NOTE — Assessment & Plan Note (Addendum)
Tired after 2 and half hour SBT.  Copious secretions. ?BNP low.  CVP in normal range today.Marland Kitchen ?Chest x-ray shows bilateral mild interstitial pattern no consolidation.  Sputum cultures negative. ? ?-Continue to hold diurese today ?-Continue daily SBT but remains marginal for extubation.  Discussed options of trial of extubation versus possibly safer option of tracheostomy as bridge to recovery.  Daughter will discuss with family. ?-Dexmedetomidine for comfort. ? ?

## 2021-09-08 NOTE — Assessment & Plan Note (Addendum)
Occasional wheezing on examination has improved.  Still copious secretions.  No PFTs since 2015. ? ?Charlyne Mom, Pulmicort.  ?-Short course steroids for COPD exacerbation, should help with secretions. ? ?

## 2021-09-08 NOTE — Assessment & Plan Note (Addendum)
EF 40 to 45%. ? ?-Not clearly volume overloaded.Marland Kitchen ?

## 2021-09-09 ENCOUNTER — Inpatient Hospital Stay (HOSPITAL_COMMUNITY): Payer: No Typology Code available for payment source

## 2021-09-09 LAB — CBC
HCT: 30.1 % — ABNORMAL LOW (ref 39.0–52.0)
Hemoglobin: 9.6 g/dL — ABNORMAL LOW (ref 13.0–17.0)
MCH: 30.7 pg (ref 26.0–34.0)
MCHC: 31.9 g/dL (ref 30.0–36.0)
MCV: 96.2 fL (ref 80.0–100.0)
Platelets: 153 10*3/uL (ref 150–400)
RBC: 3.13 MIL/uL — ABNORMAL LOW (ref 4.22–5.81)
RDW: 13.8 % (ref 11.5–15.5)
WBC: 10 10*3/uL (ref 4.0–10.5)
nRBC: 0 % (ref 0.0–0.2)

## 2021-09-09 LAB — RENAL FUNCTION PANEL
Albumin: 1.9 g/dL — ABNORMAL LOW (ref 3.5–5.0)
Anion gap: 9 (ref 5–15)
BUN: 98 mg/dL — ABNORMAL HIGH (ref 8–23)
CO2: 29 mmol/L (ref 22–32)
Calcium: 8.6 mg/dL — ABNORMAL LOW (ref 8.9–10.3)
Chloride: 106 mmol/L (ref 98–111)
Creatinine, Ser: 3.49 mg/dL — ABNORMAL HIGH (ref 0.61–1.24)
GFR, Estimated: 17 mL/min — ABNORMAL LOW (ref >60)
Glucose, Bld: 203 mg/dL — ABNORMAL HIGH (ref 70–99)
Phosphorus: 4.7 mg/dL — ABNORMAL HIGH (ref 2.5–4.6)
Potassium: 4.8 mmol/L (ref 3.5–5.1)
Sodium: 144 mmol/L (ref 135–145)

## 2021-09-09 LAB — GLUCOSE, CAPILLARY
Glucose-Capillary: 191 mg/dL — ABNORMAL HIGH (ref 70–99)
Glucose-Capillary: 179 mg/dL — ABNORMAL HIGH (ref 70–99)
Glucose-Capillary: 208 mg/dL — ABNORMAL HIGH (ref 70–99)
Glucose-Capillary: 196 mg/dL — ABNORMAL HIGH (ref 70–99)
Glucose-Capillary: 193 mg/dL — ABNORMAL HIGH (ref 70–99)
Glucose-Capillary: 173 mg/dL — ABNORMAL HIGH (ref 70–99)

## 2021-09-09 LAB — COOXEMETRY PANEL
Carboxyhemoglobin: 1.9 % — ABNORMAL HIGH (ref 0.5–1.5)
Methemoglobin: <0.7 % (ref 0.0–1.5)
O2 Saturation: 71.5 %
Total hemoglobin: 9.2 g/dL — ABNORMAL LOW (ref 12.0–16.0)

## 2021-09-09 LAB — BRAIN NATRIURETIC PEPTIDE: B Natriuretic Peptide: 53.6 pg/mL (ref 0.0–100.0)

## 2021-09-09 MED ORDER — CLONAZEPAM 0.5 MG PO TABS
0.5000 mg | ORAL_TABLET | Freq: Two times a day (BID) | ORAL | Status: DC
  Administered 2021-09-09 – 2021-09-10 (×4): 0.5 mg
  Filled 2021-09-09 (×4): qty 1

## 2021-09-09 MED ORDER — INSULIN GLARGINE-YFGN 100 UNIT/ML ~~LOC~~ SOLN
5.0000 [IU] | Freq: Once | SUBCUTANEOUS | Status: AC
  Administered 2021-09-09: 5 [IU] via SUBCUTANEOUS
  Filled 2021-09-09: qty 0.05

## 2021-09-09 MED ORDER — INSULIN GLARGINE-YFGN 100 UNIT/ML ~~LOC~~ SOLN
15.0000 [IU] | Freq: Every day | SUBCUTANEOUS | Status: DC
  Administered 2021-09-10 – 2021-09-19 (×10): 15 [IU] via SUBCUTANEOUS
  Filled 2021-09-09 (×10): qty 0.15

## 2021-09-09 NOTE — Progress Notes (Signed)
eLink Physician-Brief Progress Note ?Patient Name: Ricky Hayden ?DOB: Jun 03, 1945 ?MRN: [id number] ? ? ?Date of Service ? 09/09/2021  ?HPI/Events of Note ? Patient having frequent loose stools with the stool leaking around attempted rectal pouch.  ?eICU Interventions ? Flexiseal ordered.  ? ? ? ?  ? ?Kerry Kass Geanette Buonocore ?09/09/2021, 9:05 PM ?

## 2021-09-09 NOTE — Progress Notes (Signed)
Patient ID: Ricky Hayden, male   DOB: 1946-04-08, 76 y.o.   MRN: [id number] ?[facility] ?Progress Note  ? ?Assessment/ Plan:   ?1.  Acute kidney injury on chronic kidney disease stage III:  ATN associated from severe urinary tract infection with sepsis/shock. ?- BUN is climbing but hopeful Cr has plateaued    ?- please hold diuretics today unless needed to optimize cardiopulmonary status. Messaged primary team  ?- no acute indication for renal replacement therapy  ?- continue supportive care ? ?2.  Severe urinary tract infection with sepsis: With evidence of Proteus urinary tract infection as well as bacteremia. Abx per primary team and ID  ? ?3.  Acute hypoxic/hypercarbic respiratory failure: Suspected to have had an aspiration event and on coverage for HCAP. ? ?4. CKD stage 3 ?- noted; pre-dates admission  ? ?5.  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 ?6.  Hypernatremia: Secondary to insensible losses/decreased intake, improved a bit with increased free water.  Adjusted free water to 250 ml every 4 hours to improve tolerance  ? ?Disposition - per primary team  ? ?Subjective:   ? he had 3.1 liters UOP over 5/8.  Temp yesterday to 103.2 per charting.  Per mar hx he got a dose of lasix 120 mg IV and metolazone yesterday.    ? ?Review of systems: unable to obtain 2/2 intubation and sedation   ? ?Objective:   ?BP (!) 121/56   Pulse 79   Temp 99.9 ?F (37.7 ?C) (Oral)   Resp (!) 22   Ht $R'5\' 7"'UK$  (1.702 m)   Wt 106.5 kg   SpO2 98%   BMI 36.77 kg/m?  ? ?Intake/Output Summary (Last 24 hours) at 09/09/2021 0648 ?Last data filed at 09/09/2021 0600 ?Gross per 24 hour  ?Intake 2039.69 ml  ?Output 3125 ml  ?Net -1085.31 ml  ? ?Weight change: -3 kg ? ?Physical Exam:   ?Gen: elderly male intubated   ?CVS: S1S2 ; no rub ?HEENT - NCAT  ?Neck trachea midline  ?Resp: clear anteriorly on vent; FIO2 40 and PEEP 5 ?Abd: Soft, obese, distended with obese habitus ?Ext: no  pitting lower extremity edema  ?Neuro - on continuous sedation; moves toes with exam but does not do so to commands ?GU foley in place ? ?Imaging: ?No results found. ? ?Labs: ?BMET ?Recent Labs  ?Lab 09/04/21 ?0206 09/04/21 ?1116 09/04/21 ?1451 09/05/21 ?0313 09/06/21 ?0800 09/07/21 ?0342 09/07/21 ?2150 09/08/21 ?6222 09/08/21 ?0830 09/09/21 ?0415  ?NA 141  --   --  145 149* 152* 145 147* 146* 144  ?K 4.3  --  4.3 4.6 4.2 4.8 4.9 5.0 5.0 4.8  ?CL 106  --   --  107 117* 115* 111 110  --  106  ?CO2 28  --   --  $R'30 28 31 29 31  'Zz$ --  29  ?GLUCOSE 155*  --   --  153* 184* 168* 197* 165*  --  203*  ?BUN 80*  --   --  92* 88* 85* 85* 90*  --  98*  ?CREATININE 3.55*  --   --  3.75* 3.45* 3.39* 3.26* 3.45*  --  3.49*  ?CALCIUM 8.6*  --   --  8.8* 8.2* 8.8* 8.3* 8.7*  --  8.6*  ?PHOS  --  2.4* 2.5 3.4 3.2 3.9  --  4.3  --  4.7*  ? ?CBC ?Recent Labs  ?Lab 09/03/21 ?0850 09/04/21 ?0206 09/06/21 ?[place]  09/07/21 ?2091 09/08/21 ?0681 09/08/21 ?0830 09/09/21 ?0415  ?WBC 17.7*   < > 9.2 8.5 9.9  --  10.0  ?NEUTROABS 12.9*  --   --   --   --   --   --   ?HGB 13.3   < > 9.5* 9.4* 9.3* 10.9* 9.6*  ?HCT 41.9   < > 30.1* 30.9* 30.2* 32.0* 30.1*  ?MCV 97.0   < > 96.8 99.7 99.7  --  96.2  ?PLT 109*   < > 110* 127* 150  --  153  ? < > = values in this interval not displayed.  ? ?Medications:   ? ? amiodarone  200 mg Per Tube Daily  ? apixaban  5 mg Per Tube BID  ? chlorhexidine gluconate (MEDLINE KIT)  15 mL Mouth Rinse BID  ? Chlorhexidine Gluconate Cloth  6 each Topical Daily  ? docusate  100 mg Per Tube BID  ? feeding supplement (PROSource TF)  45 mL Per Tube BID  ? free water  250 mL Per Tube Q4H  ? insulin aspart  0-15 Units Subcutaneous Q4H  ? insulin glargine-yfgn  10 Units Subcutaneous Daily  ? ipratropium-albuterol  3 mL Nebulization Q6H  ? mouth rinse  15 mL Mouth Rinse 10 times per day  ? pantoprazole sodium  40 mg Per Tube Daily  ? PHENobarbital  64.8 mg Per Tube BID  ? polyethylene glycol  17 g Per Tube BID  ? pravastatin  40 mg  Per Tube QPM  ? senna  1 tablet Per Tube BID  ? sodium chloride flush  10-40 mL Intracatheter Q12H  ? ?Claudia Desanctis, MD ?09/09/2021, 6:59 AM ?  ? ?

## 2021-09-09 NOTE — Progress Notes (Signed)
? ?NAME:  Ricky Hayden, MRN:  [id number], DOB:  04-25-1946, LOS: 34 ?ADMISSION DATE:  08/28/2021, CONSULTATION DATE:  5/2 ?REFERRING MD:  Dr. Avon Gully, CHIEF COMPLAINT:  Acute respiratory failure  ? ?History of Present Illness:  ?History obtained from chart review and daughter [name] at bedside. ? ?Mr. Gloor is a 76 yo gentleman w/ pertinent PMH of chronic HFrEF with ICD, a-fib, CKD stage IIIb, COPD who presented to [facility] on 5/2 w/ hematuria x 3 weeks. The week prior to admission he was started on Bactrim for urine cultures showing proteus mirabilis. He began having worsening fever and SOB and was brought to [facility] ED on 4/27.  ? ?At admission he was initially started on BiPAP for SOB. Fever 103 F. Started on empiric abx. CT abdomen showing nonobstructive nephrolithiasis. Urology consulted with no indication for procedure.  Further work up showing sepsis from UTI w/ pyelonephritis. Blood cultures were positive for proteus mirabilis. On Ancef with ID managing antibiotics.  ? ?On 5/2 he began having decreased LOC and increased O2 requirements. Per his RN this began around 11AM and they waited for 2 ABG results before he was placed on BiPAP. Once he was on Bipap he had improvement in saturations. There was concern this was due to volume overload but concerning to give lasix with worsening kidney function.  Abg 7.3/ 61/ 61/ 30. PCCM consulted for hypoxia evaluation and management. ? ?Pertinent  Medical History  ? 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 ?Significant Hospital Events: ?Including procedures, antibiotic start and stop dates in addition to other pertinent events   ?4/27: admitted to [facility] for sepsis 2/2 UTI, started ceftriaxone and azithro ?4/27-5/1 on ceftriaxone ?5/2:  antibiotics deescalated to cefazolin. PCCM consult for hypoxia. Placed on BIPAP ?5/3: Bipap  all night, worsening mental status and hypoxemia, intubated, short PEA arrest after intubation, hypotensive, central line placed, CXR in AM clear, CXR after intuabntion diffuse R sided infiltrates concerning for aspiration vs HAP ?5/4 stable pressors, o2 weaned, Cr elevated, Uop diminished but not oliguric,  some flexure posturing, EEG ordered - negative ?5/5 pressor off, minimal vent settings, seems to follow commands, UOP improved, nephrology consult at family request ?5/6 no acute complications overnight currently tolerating vent wean but requiring 12 pressure repeat.  Renal function this a.m. pending ?5/7 patient received 80 mg IV Lasix overnight due to increased CVP and crackles to bases ? ?Interim History / Subjective:  ?Febrile to 103 yesterday. Today, opens eyes to verbal stimuli, follows commands to wiggle toes and initiates movement to lift arms off bed. No eye tracking. Was on PSV for 15 minutes this morning, switched over due to tachypnea and tachycardia. Sedated on precedex.  ? ?Labs: ?Cr 3.45 -> 4.49 ?BUN 90 -> 98 ?Hgb 9.6 ?BNP 53 ? ?Objective   ?Blood pressure 126/72, pulse 96, temperature 98.1 ?F (36.7 ?C), temperature source Axillary, resp. rate (!) 28, height 5\' 7"  (1.702 m), weight 106.5 kg, SpO2 100 %. ?   ?Vent Mode: PRVC ?FiO2 (%):  [40 %] 40 % ?Set Rate:  [22 bmp] 22 bmp ?Vt Set:  [520 mL] 520 mL ?PEEP:  [5 cmH20] 5 cmH20 ?Pressure Support:  [8 cmH20] 8 cmH20 ?Plateau Pressure:  [14 cmH20-20 cmH20] 20 cmH20  ? ?Intake/Output Summary (Last 24 hours) at 09/09/2021 1117 ?Last data filed at 09/09/2021 1000 ?Gross per 24 hour  ?Intake  2304.21 ml  ?Output 2875 ml  ?Net -570.79 ml  ? ? ?Filed Weights  ? 09/07/21 0500 09/08/21 0400 09/09/21 0415  ?Weight: 104.9 kg 109.5 kg 106.5 kg  ? ? ?Examination: ?General: Acute on chronic ill-appearing obese elderly male lying in bed in no acute distress ?HEENT: ETT, MM pink/moist, PERRL,  ?Neuro: Opens eyes to verbal stimuli, able to follow commands ?CV: regular  rate and rhythm ?PULM: Coarse breath sounds bilaterally, rhonchi in anterior lung fields, on PRVC ?GI: soft, bowel sounds present, non-tender, non-distended, tolerating TF ?Extremities: warm/dry, trace edema  ?Skin: no rashes or lesions ? ?Resolved Hospital Problem list   ?Sepsis due to Proteus pyelonephritis ? ?Assessment & Plan:  ? ?Acute respiratory failure with hypoxia and hypercapnia  Concern for aspiration event during intubation  Chronic severe COPD ?This morning PSV was stopped again after a short wean due to tachypnea and tachycardia. Exam significant for coarse breath sounds bilaterally and rhonchi. Labs notable for stable creatinine, increased BUN, normal BNP, and normal co-ox. CXR today without new consolidation or significant pulmonary edema. Etiologies for failure to wean include cardiac, respiratory and nutritional causes. Given normal BNP, fluid overload seems less likely. Although fever is suspicious for infection, VAP is unlikely given lack of leukocytosis and unchanged CXR. Patient has been at goal with tube feeds making nutritional causes less likely.   It is possible that his mental status is contributing to difficulty weaning. Baseline COPD may also be contributing. Will plan to wean sedation as able, optimize COPD, and continue SBT ?- Hold further diuresis given normal BNP ?- Start clonazepam 0.5 mg BID for agitation ?- Wean precedex as able ?- Continue duonebs ?- Start home budesonide-formoterol ? ?AKI on chronic kidney disease stage IIIb ?Creatinine stable/mildly increased following appropriate response to diuresis. Per nephro, likely ATN from severe UTI with sepsis. Given normal BNP, less suspicion for venous congestion contributing to kidney injury. Will hold on further diuresis and monitor kidney function. ?- Hold diuresis as above ?- Strict I/Os, daily weights ?- Nephrology following, appreciate assistance ? ?Brief PEA cardiac arrest post intubation  Chronic systolic and diastolic  congestive heart failure  A-fib on Eliquis  Hx of HTN/HLD ?Less concern for volume overload given normal BNP.  ?- Continue home amiodarone 200 mg daily ?- Continue home eliquis 5 mg BID ?- Hold home carvedilol, hydralazine, Entresto, spironolactone and pravastatin ? ?Acute metabolic encephalopathy ?Patient with waxing and waning alertness, more suggestive of delirium associated with acute illness rather than permanent brain damage from PEA arrest. Will plan to limit sedation while providing appropriate pain management and continue to monitor. ?- Continue precedex drip for better pain control ?- continue fentanyl 25 mg every 30 PRN ?- start clonazepam 0.5 mg BID  ? ?Elevated blood glucose ?CBG has ranged from 179 - 208. Does not appear to have a history of diabetes. Will increase basal insulin. ?- increase insulin glargine-yfgn to 15 U daily ? ?Hx of brain surgery, on seizure prophylaxis ?Spot EEG 5/4 negative for seizure activity ?- Continue home phenobarbital 63.8 mg BID ? ?Sepsis due to Proteus pyelonephritis and bacteremia with additional concern for aspiration pneumonia (resolved) ?Blood pressures have normalized off pressors, patient has been afebrile. Will continue treating UTI with unasin through 5/10 per ID ?- Unasyn 3g every 12 hours through 5/10 ? ?Anemia  Thrombocytopenia ?Likely in the setting of sepsis, improving  ?- Daily CBC ?- Transfuse for hgb <7 ? ?Constipation ?- Continue miralax, senna, colace ? ?Best Practice (right click  and "Reselect all SmartList Selections" daily)  ? ?Diet/type: tubefeeds ?DVT prophylaxis: DOAC ?GI prophylaxis: PPI ?Lines: N/A ?Foley:  Yes, and it is still needed ?Code Status:  full code ?Last date of multidisciplinary goals of care discussion: Update family daily ? ?Critical care time:  ? ?Excell Seltzer, MS4 ? ?

## 2021-09-10 DIAGNOSIS — R6520 Severe sepsis without septic shock: Secondary | ICD-10-CM | POA: Diagnosis not present

## 2021-09-10 DIAGNOSIS — N179 Acute kidney failure, unspecified: Secondary | ICD-10-CM | POA: Diagnosis not present

## 2021-09-10 DIAGNOSIS — A419 Sepsis, unspecified organism: Secondary | ICD-10-CM | POA: Diagnosis not present

## 2021-09-10 LAB — RENAL FUNCTION PANEL
Albumin: 1.8 g/dL — ABNORMAL LOW (ref 3.5–5.0)
Anion gap: 6 (ref 5–15)
BUN: 98 mg/dL — ABNORMAL HIGH (ref 8–23)
CO2: 30 mmol/L (ref 22–32)
Calcium: 8.6 mg/dL — ABNORMAL LOW (ref 8.9–10.3)
Chloride: 109 mmol/L (ref 98–111)
Creatinine, Ser: 3.34 mg/dL — ABNORMAL HIGH (ref 0.61–1.24)
GFR, Estimated: 18 mL/min — ABNORMAL LOW (ref >60)
Glucose, Bld: 177 mg/dL — ABNORMAL HIGH (ref 70–99)
Phosphorus: 5.5 mg/dL — ABNORMAL HIGH (ref 2.5–4.6)
Potassium: 5.1 mmol/L (ref 3.5–5.1)
Sodium: 145 mmol/L (ref 135–145)

## 2021-09-10 LAB — GLUCOSE, CAPILLARY
Glucose-Capillary: 134 mg/dL — ABNORMAL HIGH (ref 70–99)
Glucose-Capillary: 180 mg/dL — ABNORMAL HIGH (ref 70–99)
Glucose-Capillary: 193 mg/dL — ABNORMAL HIGH (ref 70–99)
Glucose-Capillary: 195 mg/dL — ABNORMAL HIGH (ref 70–99)
Glucose-Capillary: 197 mg/dL — ABNORMAL HIGH (ref 70–99)
Glucose-Capillary: 199 mg/dL — ABNORMAL HIGH (ref 70–99)

## 2021-09-10 LAB — BRAIN NATRIURETIC PEPTIDE: B Natriuretic Peptide: 27.6 pg/mL (ref 0.0–100.0)

## 2021-09-10 LAB — MAGNESIUM: Magnesium: 2.9 mg/dL — ABNORMAL HIGH (ref 1.7–2.4)

## 2021-09-10 LAB — CBC
HCT: 28.2 % — ABNORMAL LOW (ref 39.0–52.0)
Hemoglobin: 8.9 g/dL — ABNORMAL LOW (ref 13.0–17.0)
MCH: 30.8 pg (ref 26.0–34.0)
MCHC: 31.6 g/dL (ref 30.0–36.0)
MCV: 97.6 fL (ref 80.0–100.0)
Platelets: 152 10*3/uL (ref 150–400)
RBC: 2.89 MIL/uL — ABNORMAL LOW (ref 4.22–5.81)
RDW: 13.8 % (ref 11.5–15.5)
WBC: 9.8 10*3/uL (ref 4.0–10.5)
nRBC: 0 % (ref 0.0–0.2)

## 2021-09-10 MED ORDER — DOCUSATE SODIUM 50 MG/5ML PO LIQD: 100.0000 mg | Freq: Two times a day (BID) | ORAL | Status: DC | PRN

## 2021-09-10 MED ORDER — POLYETHYLENE GLYCOL 3350 17 G PO PACK: 17.0000 g | PACK | Freq: Every day | ORAL | Status: DC | PRN

## 2021-09-10 MED ORDER — SENNA 8.6 MG PO TABS: 1.0000 | ORAL_TABLET | Freq: Every evening | ORAL | Status: DC | PRN

## 2021-09-10 MED ORDER — SODIUM ZIRCONIUM CYCLOSILICATE 10 G PO PACK
10.0000 g | PACK | Freq: Once | ORAL | Status: AC
  Administered 2021-09-10: 10 g
  Filled 2021-09-10: qty 1

## 2021-09-10 NOTE — Progress Notes (Signed)
Nutrition Follow-up ? ?DOCUMENTATION CODES:  ? ?Not applicable ? ?INTERVENTION:  ? ?Continue tube feeds via OG tube: ?- Vital 1.5 @ 55 ml/hr (1320 ml/day) ?- ProSource TF 45 ml BID ?- Free water flushes per CCM/Nephrology, currently 250 ml q 4 hours ? ?Tube feeding regimen provides 2060 kcal, 111 grams of protein, and 1008 ml of H2O. ? ?Total free water with flushes: 2508 ml ? ?NUTRITION DIAGNOSIS:  ? ?Inadequate oral intake related to inability to eat as evidenced by NPO status. ? ?Ongoing, being addressed via TF ? ?GOAL:  ? ?Patient will meet greater than or equal to 90% of their needs ? ?Met via TF ? ?MONITOR:  ? ?Vent status, Labs, Weight trends, TF tolerance, Skin, I & O's ? ?REASON FOR ASSESSMENT:  ? ?Ventilator, Consult ?Enteral/tube feeding initiation and management, Assessment of nutrition requirement/status ? ?ASSESSMENT:  ? ?76 year old male who presented to the ED on 4/27 with SOB. PMH of CHF with ICD, atrial fibrillation, CKD stage IIIb, COPD, seizures, CAD, GERD, HLD, HTN, brain surgery. Pt admitted with sepsis secondary to UTI, non-obstructing nephrolithiasis/pyelonephritis, bacteremia. ? ?05/02 - rapid response due to decreased LOC and increased O2 needs ?05/03 - intubated, short PEA arrest after intubation ? ?Discussed pt with RN and during ICU rounds. Pt weaned for 2 hours this morning. Extubation unlikely per RN. Sodium improved with free water flushes. Nephrology following. ? ?Spoke with pt's family member at bedside who denies any questions or concerns about tube feeds. Will continue with current tube feeding regimen at this time. ? ?Admit weight: 107.2 kg ?Current weight: 106.2 kg ? ?Pt with moderate pitting generalized edema, mild to moderate pitting edema to BUE, and mild pitting edema to BLE. ? ?Current TF: Vital 1.5 @ 55 ml/hr, ProSource TF 45 ml BID, free water 250 ml q 4 hours ? ?Patient remains intubated on ventilator support ?MV: 13 L/min ?Temp (24hrs), Avg:99.5 ?F (37.5 ?C), Min:98.1  ?F (36.7 ?C), Max:101 ?F (38.3 ?C) ? ?Drips: ?Precedex ? ?Medications reviewed and include: SSI q 4 hours, semglee 15 units daily, protonix, phenobarbital, IV abx ? ?Labs reviewed: BUN 98, creatinine 3.34, phosphorus 5.5, magnesium 2.9, hemoglobin 8.9 ?CBG's: 173-208 x 24 hours ? ?UOP: 1595 ml x 24 hours ?I/O's: -325 ml since admit ? ?Diet Order:   ?Diet Order   ? ?       ?  Diet NPO time specified  Diet effective now       ?  ? ?  ?  ? ?  ? ? ?EDUCATION NEEDS:  ? ?Education needs have been addressed ? ?Skin:  Skin Assessment: ?Skin Integrity Issues: ?DTI: medial buttocks ?Unstageable: nose ? ?Last BM:  09/10/21 rectal tube ? ?Height:  ? ?Ht Readings from Last 1 Encounters:  ?08/29/21 $RemoveBe'5\' 7"'IIwQTJFPT$  (1.702 m)  ? ? ?Weight:  ? ?Wt Readings from Last 1 Encounters:  ?09/10/21 106.2 kg  ? ? ?Ideal Body Weight:  67.3 kg ? ?BMI:  Body mass index is 36.67 kg/m?. ? ?Estimated Nutritional Needs:  ? ?Kcal:  2000-2200 ? ?Protein:  110-130 grams ? ?Fluid:  >/= 2.0 L ? ? ? ?Gustavus Bryant, MS, RD, LDN ?Inpatient Clinical Dietitian ?Please see AMiON for contact information. ? ?

## 2021-09-10 NOTE — Progress Notes (Signed)
? ?NAME:  Ricky Hayden, MRN:  [id number], DOB:  1946-02-06, LOS: 12 ?ADMISSION DATE:  08/28/2021, CONSULTATION DATE:  5/2 ?REFERRING MD:  Dr. Avon Gully, CHIEF COMPLAINT:  Acute respiratory failure  ? ?History of Present Illness:  ?History obtained from chart review and daughter [name] at bedside. ? ?Mr. Boardley is a 76 yo gentleman w/ pertinent PMH of chronic HFrEF with ICD, a-fib, CKD stage IIIb, COPD who presented to [facility] on 5/2 w/ hematuria x 3 weeks. The week prior to admission he was started on Bactrim for urine cultures showing proteus mirabilis. He began having worsening fever and SOB and was brought to [facility] ED on 4/27.  ? ?At admission he was initially started on BiPAP for SOB. Fever 103 F. Started on empiric abx. CT abdomen showing nonobstructive nephrolithiasis. Urology consulted with no indication for procedure.  Further work up showing sepsis from UTI w/ pyelonephritis. Blood cultures were positive for proteus mirabilis. On Ancef with ID managing antibiotics.  ? ?On 5/2 he began having decreased LOC and increased O2 requirements. Per his RN this began around 11AM and they waited for 2 ABG results before he was placed on BiPAP. Once he was on Bipap he had improvement in saturations. There was concern this was due to volume overload but concerning to give lasix with worsening kidney function.  Abg 7.3/ 61/ 61/ 30. PCCM consulted for hypoxia evaluation and management. ? ?Pertinent  Medical History  ? 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 ?Significant Hospital Events: ?Including procedures, antibiotic start and stop dates in addition to other pertinent events   ?4/27: admitted to [facility] for sepsis 2/2 UTI, started ceftriaxone and azithro ?4/27-5/1 on ceftriaxone ?5/2:  antibiotics deescalated to cefazolin. PCCM consult for hypoxia. Placed on BIPAP ?5/3: Bipap  all night, worsening mental status and hypoxemia, intubated, short PEA arrest after intubation, hypotensive, central line placed, CXR in AM clear, CXR after intuabntion diffuse R sided infiltrates concerning for aspiration vs HAP ?5/4 stable pressors, o2 weaned, Cr elevated, Uop diminished but not oliguric,  some flexure posturing, EEG ordered - negative ?5/5 pressor off, minimal vent settings, seems to follow commands, UOP improved, nephrology consult at family request ?5/6 no acute complications overnight currently tolerating vent wean but requiring 12 pressure repeat.  Renal function this a.m. pending ?5/7 patient received 80 mg IV Lasix overnight due to increased CVP and crackles to bases ?5/10 now tolerating weaning. ? ?Interim History / Subjective:  ?Remains afebrile.  Now follows commands and tolerating SBT. ? ?Objective   ?Blood pressure (!) 114/59, pulse 76, temperature 99.1 ?F (37.3 ?C), temperature source Oral, resp. rate (!) 32, height 5\' 7"  (1.702 m), weight 106.2 kg, SpO2 100 %. ?   ?Vent Mode: CPAP;PSV ?FiO2 (%):  [40 %] 40 % ?Set Rate:  [22 bmp] 22 bmp ?Vt Set:  [520 mL] 520 mL ?PEEP:  [5 cmH20] 5 cmH20 ?Pressure Support:  [10 cmH20] 10 cmH20 ?Plateau Pressure:  [14 cmH20-20 cmH20] 14 cmH20  ? ?Intake/Output Summary (Last 24 hours) at 09/10/2021 0916 ?Last data filed at 09/10/2021 3086 ?Gross per 24 hour  ?Intake 1833 ml  ?Output 1695 ml  ?Net 138 ml  ? ? ?Filed Weights  ? 09/08/21 0400 09/09/21 0415 09/10/21 0320  ?Weight: 109.5 kg 106.5 kg 106.2 kg  ? ? ?Examination: ?General: Acute on chronic ill-appearing obese elderly male lying in  bed in no acute distress ?HEENT: ETT, MM pink/moist, PERRL,  ?Neuro: Opens eyes to verbal stimuli, able to follow commands ?CV: regular rate and rhythm ?PULM: Chest clear on SBT GI: soft, bowel sounds present, non-tender, non-distended, tolerating TF ?Extremities: warm/dry, trace edema  ?Skin: no rashes or lesions ? ?Resolved Hospital Problem list   ?Sepsis due to  Proteus pyelonephritis ? ?Assessment & Plan:  ? ?Acute respiratory failure with hypoxia ([place]) ?Tolerating SBT today. ?BNP low.  CVP in normal range today.Marland Kitchen ?Chest x-ray shows bilateral mild interstitial pattern no consolidation.  Sputum cultures negative. ? ?-Hold diurese today ?-Continue daily SBT -may be extubated today. ?-Dexmedetomidine for comfort. ? ?COPD (chronic obstructive pulmonary disease) ([place]) ?Occasional wheezing on examination has improved. ? ?- Charlyne Mom, Pulmicort.  ? ?Chronic combined systolic and diastolic CHF, NYHA class 2 ([place]) ?Edema has improved, elevated CVP at 14 but BNP negative. ? ?-Hold further diuresis as volume status equivocal. ? ?NICM (nonischemic cardiomyopathy) ([place]) ?EF 40 to 45%. ? ?-Not clearly volume overloaded.. ? ?Septic shock ([place]) ?Due to Proteus mirabilis urinary tract infection related to BPH. ?Off vasopressors. ? ?-Complete 10-day course of antibiotics. ? ?Bacteremia due to Gram-negative bacteria ?Proteus (mirabilis) (morganii) as the cause of diseases classified elsewhere ?Complicated UTI (urinary tract infection) ? ? ?BPH (benign prostatic hyperplasia) ?On finasteride at home ? ?-Start doxazosin and Urecholine for possible urinary retention ? ?ARF (acute renal failure) ([place]) ? ?-No further diuresis ? ?CKD (chronic kidney disease) stage 3, GFR 30-59 ml/min (HCC) ? ?  ?Best Practice (right click and "Reselect all SmartList Selections" daily)  ? ?Diet/type: tubefeeds ?DVT prophylaxis: DOAC ?GI prophylaxis: PPI ?Lines: N/A ?Foley:  Yes, and it is still needed ?Code Status:  full code ?Last date of multidisciplinary goals of care discussion: Update family daily ? ?CRITICAL CARE ?Performed by: Kipp Brood ? ? ?Total critical care time: 40 minutes ? ?Critical care time was exclusive of separately billable procedures and treating other patients. ? ?Critical care was necessary to treat or prevent imminent or life-threatening deterioration. ? ?Critical care was time  spent personally by me on the following activities: development of treatment plan with patient and/or surrogate as well as nursing, discussions with consultants, evaluation of patient's response to treatment, examination of patient, obtaining history from patient or surrogate, ordering and performing treatments and interventions, ordering and review of laboratory studies, ordering and review of radiographic studies, pulse oximetry, re-evaluation of patient's condition and participation in multidisciplinary rounds. ? ?Kipp Brood, MD FRCPC ?ICU Physician ?[place]  ?Pager: [phone] ?Mobile: [phone] ?After hours: [phone]. ? ?

## 2021-09-10 NOTE — Progress Notes (Signed)
? ?[facility] Infectious Diseases Follow Up Note ? ?Patient Identification: ?Patient Name: Ricky Hayden MRN: [id number] [place] Date: 08/28/2021 11:34 PM ?Age: 76 y.o.Today's Date: 09/10/2021 ? ?Reason for Visit: sepsis, monitor cultures  ? ?Active Problems: ?  BPH (benign prostatic hyperplasia) ?  COPD (chronic obstructive pulmonary disease) ([place]) ?  Chronic combined systolic and diastolic CHF, NYHA class 2 ([place]) ?  CKD (chronic kidney disease) stage 3, GFR 30-59 ml/min (HCC) ?  NICM (nonischemic cardiomyopathy) ([place]) ?  ARF (acute renal failure) ([place]) ?  Proteus (mirabilis) (morganii) as the cause of diseases classified elsewhere ?  Septic shock ([place]) ?  Acute respiratory failure with hypoxia ([place]) ?  Bacteremia due to Gram-negative bacteria ?  Complicated UTI (urinary tract infection) ? ? ?Antibiotics: ?Ceftriaxone 4/27-5/1 ?Cefazolin 5/2, Cefepime 5/4 ?Azithromycin  ?Unasyn 5/4-c ?Linezolid 5/3-5/4 ? ?Lines/Hardwares: 5/03-c RT IJ CVC ? ?Interval Events/subjective:  ?Spiking fever ?Wbc normal; cr elevated/stable ?No rash/n/v ?Tolerating weaning but at times remain suctioning for periods of desat ? ? ?Assessment/plan ?Septic shock 2/2 Proteus Complicated UTI/Nephrolithiasis/bacteremia + Possible aspiration PNA - off pressors. Fever off and on ?aspiration pneumonitis vs occult bacteremia? Vent setting stable with diuresis. I don't find much evidence to support drug fever but possible (without rash/leukcoytosis/eosinophilia) ? ?5/9 assessment. Intermittent fever but improving wbc, improving respiratory status, no diarrhea, no sign of intraabd infection. ?aspiration pneumonitis off and on in setting of weakness? ? ?-repeat bcx ngtd; sputum cx ngtd (budding yeast is a normal flora and not pathogenic) ?-stop abx tomorrow ?-discuss with primary team ?-at risk for clabsi; would remove central line if no further indication for use ? ? ?Acute metabolic/toxic  encephalopathy ?       =EEG 5/4 negative for seizure activity  ? ?Acute Respiratory Failure 2/2 above - tracheal aspirate 5/4 candida albicans is likely a colonizer and non indication to treat. Primary team working on volume control ? ?Thrombocytopenia ?AKI on CKD in the setting of ATN from shock/arrest  ?Chronic CHFrEF s/p ICD ?Brief cardiac arrest post intubation ? ? ? ? ?Vitals ?BP (!) 137/39   Pulse (!) 101   Temp (!) 101.7 ?F (38.7 ?C) (Axillary)   Resp (!) 25   Ht 5\' 7"  (1.702 m)   Wt 106.2 kg   SpO2 95%   BMI 36.67 kg/m?  ? ?  ?Physical Exam ?General/constitutional: no distress, ill appearing, ett intact, some interaction with eye tracking ?HEENT: normocephalic; conj clear; per ?Neck supple -- right sided ij cvc in place no erythema/swelling ?CV: rrr no mrg ?Lungs: normal respiratory effrt on vent ?Abd: Soft, Nontender ?Ext: no edema ?Skin: No Rash ?Neuro: generalized weakness ?MSK: no peripheral joint swelling/tenderness/warmth ? ? ?Central line presence: right ij no erythema/fluctuance ? ? ?Pertinent Microbiology ?Results for orders placed or performed during the hospital encounter of 08/28/21  ?Urine Culture     Status: None  ? Collection Time: 08/28/21 11:38 PM  ? Specimen: In/Out Cath Urine  ?Result Value Ref Range Status  ? Specimen Description IN/OUT CATH URINE  Final  ? Special Requests NONE  Final  ? Culture   Final  ?  NO GROWTH ?Performed at [facility], [place] [address], [place], [place] [address] ?  ? Report Status 08/30/2021 FINAL  Final  ?Blood Culture (routine x 2)     Status: Abnormal  ? Collection Time: 08/28/21 11:45 PM  ? Specimen: BLOOD LEFT FOREARM  ?Result Value Ref Range Status  ? Specimen Description BLOOD LEFT FOREARM  Final  ?  Special Requests   Final  ?  BOTTLES DRAWN AEROBIC AND ANAEROBIC Blood Culture adequate volume  ? Culture  Setup Time   Final  ?  GRAM NEGATIVE RODS ?ANAEROBIC BOTTLE ONLY ?CRITICAL RESULT CALLED TO, READ BACK BY AND VERIFIED WITH: PHARM D J.FRIENS  ON [id number] AT 0941 BY E.PARRISH ?Performed at [facility], [place] [address], [place], [place] [address] ?  ? Culture PROTEUS MIRABILIS (A)  Final  ? Report Status 09/02/2021 FINAL  Final  ? Organism ID, Bacteria PROTEUS MIRABILIS  Final  ?    Susceptibility  ? Proteus mirabilis - MIC*  ?  AMPICILLIN <=2 SENSITIVE Sensitive   ?  CEFAZOLIN <=4 SENSITIVE Sensitive   ?  CEFEPIME <=0.12 SENSITIVE Sensitive   ?  CEFTAZIDIME <=1 SENSITIVE Sensitive   ?  CEFTRIAXONE <=0.25 SENSITIVE Sensitive   ?  CIPROFLOXACIN <=0.25 SENSITIVE Sensitive   ?  GENTAMICIN <=1 SENSITIVE Sensitive   ?  IMIPENEM 2 SENSITIVE Sensitive   ?  TRIMETH/SULFA <=20 SENSITIVE Sensitive   ?  AMPICILLIN/SULBACTAM <=2 SENSITIVE Sensitive   ?  PIP/TAZO <=4 SENSITIVE Sensitive   ?  * PROTEUS MIRABILIS  ?Blood Culture ID Panel (Reflexed)     Status: Abnormal  ? Collection Time: 08/28/21 11:45 PM  ?Result Value Ref Range Status  ? Enterococcus faecalis NOT DETECTED NOT DETECTED Final  ? Enterococcus Faecium NOT DETECTED NOT DETECTED Final  ? Listeria monocytogenes NOT DETECTED NOT DETECTED Final  ? Staphylococcus species NOT DETECTED NOT DETECTED Final  ? Staphylococcus aureus (BCID) NOT DETECTED NOT DETECTED Final  ? Staphylococcus epidermidis NOT DETECTED NOT DETECTED Final  ? Staphylococcus lugdunensis NOT DETECTED NOT DETECTED Final  ? Streptococcus species NOT DETECTED NOT DETECTED Final  ? Streptococcus agalactiae NOT DETECTED NOT DETECTED Final  ? Streptococcus pneumoniae NOT DETECTED NOT DETECTED Final  ? Streptococcus pyogenes NOT DETECTED NOT DETECTED Final  ? A.calcoaceticus-baumannii NOT DETECTED NOT DETECTED Final  ? Bacteroides fragilis NOT DETECTED NOT DETECTED Final  ? Enterobacterales DETECTED (A) NOT DETECTED Final  ?  Comment: Enterobacterales represent a large order of gram negative bacteria, not a single organism. ?CRITICAL RESULT CALLED TO, READ BACK BY AND VERIFIED WITH: ?PHARM D J.FRIENS ON [id number] AT 0941 BY E.PARRISH ?  ?  Enterobacter cloacae complex NOT DETECTED NOT DETECTED Final  ? Escherichia coli NOT DETECTED NOT DETECTED Final  ? Klebsiella aerogenes NOT DETECTED NOT DETECTED Final  ? Klebsiella oxytoca NOT DETECTED NOT DETECTED Final  ? Klebsiella pneumoniae NOT DETECTED NOT DETECTED Final  ? Proteus species DETECTED (A) NOT DETECTED Final  ?  Comment: CRITICAL RESULT CALLED TO, READ BACK BY AND VERIFIED WITH: ?PHARM D J.FRIENS ON [id number] AT 0941 BY E.PARRISH ?  ? Salmonella species NOT DETECTED NOT DETECTED Final  ? Serratia marcescens NOT DETECTED NOT DETECTED Final  ? Haemophilus influenzae NOT DETECTED NOT DETECTED Final  ? Neisseria meningitidis NOT DETECTED NOT DETECTED Final  ? Pseudomonas aeruginosa NOT DETECTED NOT DETECTED Final  ? Stenotrophomonas maltophilia NOT DETECTED NOT DETECTED Final  ? Candida albicans NOT DETECTED NOT DETECTED Final  ? Candida auris NOT DETECTED NOT DETECTED Final  ? Candida glabrata NOT DETECTED NOT DETECTED Final  ? Candida krusei NOT DETECTED NOT DETECTED Final  ? Candida parapsilosis NOT DETECTED NOT DETECTED Final  ? Candida tropicalis NOT DETECTED NOT DETECTED Final  ? Cryptococcus neoformans/gattii NOT DETECTED NOT DETECTED Final  ? CTX-M ESBL NOT DETECTED NOT DETECTED Final  ? Carbapenem resistance IMP NOT DETECTED  NOT DETECTED Final  ? Carbapenem resistance KPC NOT DETECTED NOT DETECTED Final  ? Carbapenem resistance NDM NOT DETECTED NOT DETECTED Final  ? Carbapenem resist OXA 48 LIKE NOT DETECTED NOT DETECTED Final  ? Carbapenem resistance VIM NOT DETECTED NOT DETECTED Final  ?  Comment: Performed at [facility], [place] [address], [place], [place] [address]  ?Blood Culture (routine x 2)     Status: None  ? Collection Time: 08/28/21 11:47 PM  ? Specimen: BLOOD  ?Result Value Ref Range Status  ? Specimen Description BLOOD LEFT ANTECUBITAL  Final  ? Special Requests   Final  ?  BOTTLES DRAWN AEROBIC AND ANAEROBIC Blood Culture adequate volume  ? Culture   Final  ?  NO GROWTH 5  DAYS ?Performed at [facility], [place] [address], [place],  [address] ?  ? Report Status 09/03/2021 FINAL  Final  ?Resp Panel by RT-PCR (Flu A&B, Covid) Nasopharyngeal Swab     Status: None  ? Collection Time: 04/28

## 2021-09-10 NOTE — Progress Notes (Signed)
Patient ID: Ricky Hayden, male   DOB: Jan 29, 1946, 76 y.o.   MRN: [id number] ?[facility] ?Progress Note  ? ?Assessment/ Plan:   ?1.  Acute kidney injury on chronic kidney disease stage III:  ATN associated from severe urinary tract infection with sepsis/shock ?- hopeful for continued improvement in Cr.  BUN has plateaued ?- please hold diuretics today unless needed to optimize cardiopulmonary status ?- no acute indication for renal replacement therapy  ?- continue supportive care ?- will discuss with primary team  ?- lokelma once today  ? ?2.  Severe urinary tract infection with sepsis: With evidence of Proteus urinary tract infection as well as bacteremia. Abx per primary team and ID  ? ?3.  Acute hypoxic/hypercarbic respiratory failure: Suspected to have had an aspiration event and on coverage for HCAP. ? ?4. CKD stage 3 ?- noted; pre-dates admission  ? ?5.  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 ?6.  Hypernatremia: Secondary to insensible losses/decreased intake, improved a bit with increased free water.  Adjusted free water to 250 ml every 4 hours to improve tolerance  ? ?Disposition - per primary team  ? ?Subjective:   ? he had 1.6 liters UOP over 5/9.  Spoke with RN and she was not reported any acute events overnight.  Per team note not tolerating SBT's.      ? ?Review of systems: unable to obtain 2/2 intubation and sedation   ? ?Objective:   ?BP (!) 96/54   Pulse 66   Temp (!) 101 ?F (38.3 ?C) (Axillary) Comment: tylenol given  Resp (!) 22   Ht 5\' 7"  (1.702 m)   Wt 106.2 kg   SpO2 100%   BMI 36.67 kg/m?  ? ?Intake/Output Summary (Last 24 hours) at 09/10/2021 0723 ?Last data filed at 09/10/2021 0600 ?Gross per 24 hour  ?Intake 1946.96 ml  ?Output 1695 ml  ?Net 251.96 ml  ? ?Weight change: -0.3 kg ? ?Physical Exam:   ?Gen: elderly male intubated    ?CVS: S1S2 ; no rub ?HEENT - NCAT  ?Neck trachea midline  ?Resp: clear anteriorly on vent;  FIO2 40 and PEEP 5 ?Abd: Soft, obese, distended with obese habitus ?Ext: no pitting lower extremity edema ; 1+ edema bilateral hands ?Neuro - on continuous sedation; moves toes with exam and follows basic motor commands ("show me your thumb") ?GU foley in place ? ?Imaging: ?DG CHEST PORT 1 VIEW ? ?Result Date: 09/09/2021 ?CLINICAL DATA:  Pneumonia EXAM: PORTABLE CHEST 1 VIEW COMPARISON:  09/05/2021 FINDINGS: Mild cardiac enlargement. Normal vascularity. Negative for heart failure. Endotracheal tube in good position. NG tube in the stomach. Right jugular central venous catheter tip mid SVC. Pacemaker leads unchanged. Left lower lobe airspace disease unchanged. Improvement in mild right lower lobe airspace disease. IMPRESSION: Support lines remain in good position Left lower lobe atelectasis/infiltrate unchanged. Mild improved aeration right lung base. Improved lung volume compared with the prior study. Electronically Signed   By: 11/05/2021 M.D.   On: 09/09/2021 11:12   ? ?Labs: ?BMET ?Recent Labs  ?Lab 09/04/21 ?1451 09/05/21 ?0313 09/06/21 ?0800 09/07/21 ?0342 09/07/21 ?2150 09/08/21 ?11/08/21 09/08/21 ?0830 09/09/21 ?0415 09/10/21 ?0310  ?NA  --  145 149* 152* 145 147* 146* 144 145  ?K 4.3 4.6 4.2 4.8 4.9 5.0 5.0 4.8 5.1  ?CL  --  107 117* 115* 111 110  --  106 109  ?CO2  --  30 28 31  29  31  --  29 30  ?GLUCOSE  --  153* 184* 168* 197* 165*  --  203* 177*  ?BUN  --  92* 88* 85* 85* 90*  --  98* 98*  ?CREATININE  --  3.75* 3.45* 3.39* 3.26* 3.45*  --  3.49* 3.34*  ?CALCIUM  --  8.8* 8.2* 8.8* 8.3* 8.7*  --  8.6* 8.6*  ?PHOS 2.5 3.4 3.2 3.9  --  4.3  --  4.7* 5.5*  ? ?CBC ?Recent Labs  ?Lab 09/03/21 ?0850 09/04/21 ?0206 09/07/21 ?9774 09/08/21 ?1423 09/08/21 ?0830 09/09/21 ?0415 09/10/21 ?0310  ?WBC 17.7*   < > 8.5 9.9  --  10.0 9.8  ?NEUTROABS 12.9*  --   --   --   --   --   --   ?HGB 13.3   < > 9.4* 9.3* 10.9* 9.6* 8.9*  ?HCT 41.9   < > 30.9* 30.2* 32.0* 30.1* 28.2*  ?MCV 97.0   < > 99.7 99.7  --  96.2 97.6  ?PLT  109*   < > 127* 150  --  153 152  ? < > = values in this interval not displayed.  ? ?Medications:   ? ? amiodarone  200 mg Per Tube Daily  ? apixaban  5 mg Per Tube BID  ? chlorhexidine gluconate (MEDLINE KIT)  15 mL Mouth Rinse BID  ? Chlorhexidine Gluconate Cloth  6 each Topical Daily  ? clonazePAM  0.5 mg Per Tube BID  ? docusate  100 mg Per Tube BID  ? feeding supplement (PROSource TF)  45 mL Per Tube BID  ? free water  250 mL Per Tube Q4H  ? insulin aspart  0-15 Units Subcutaneous Q4H  ? insulin glargine-yfgn  15 Units Subcutaneous Daily  ? ipratropium-albuterol  3 mL Nebulization Q6H  ? mouth rinse  15 mL Mouth Rinse 10 times per day  ? pantoprazole sodium  40 mg Per Tube Daily  ? PHENobarbital  64.8 mg Per Tube BID  ? polyethylene glycol  17 g Per Tube BID  ? pravastatin  40 mg Per Tube QPM  ? senna  1 tablet Per Tube BID  ? sodium chloride flush  10-40 mL Intracatheter Q12H  ? ?Claudia Desanctis, MD ?09/10/2021, 7:35 AM ? ?  ? ?

## 2021-09-10 NOTE — TOC Progression Note (Addendum)
Transition of Care (TOC) - Progression Note  ? ? ?Patient Details  ?Name: Ricky Hayden ?MRN: [id number] ?Date of Birth: 1946-04-28 ? ?Transition of Care (TOC) CM/SW Contact  ?Tom-Johnson, Renea Ee, RN ?Phone Number: ?09/10/2021, 4:38 PM ? ?Clinical Narrative:    ? ?CM spoke with April with [facility] about patient's disposition. CM notified April that patient is still intubated and has not yet been evaluated by PT/OT.  ?Patient's SW is Cyndee Brightly, [phone] and [phone] ext [phone]. To call [facility] for updates or recommendations. PCP is Charlyne Petrin, MD. ?Patient has home O2 and daughter unable to tell the company he gets his supplies from. CM checked with [employer] and [employer] and he is not in their system. Awaiting response from [place]. ?Active with [facility] for home health disciplines.  ?AIR recommended by PT/OT. Patient is not appropriate for AIR at this time due to him currently intubated. CM will continue too follow with needs.  ?  ?  ? ?Expected Discharge Plan and Services ?  ?  ?  ?  ?  ?                ?  ?  ?  ?  ?  ?  ?  ?  ?  ?  ? ? ?Social Determinants of Health (SDOH) Interventions ?  ? ?Readmission Risk Interventions ? ?  09/08/2021  ?  3:40 PM 08/18/2019  ? 11:08 AM 08/17/2019  ?  3:41 PM  ?Readmission Risk Prevention Plan  ?Transportation Screening Complete Complete Complete  ?PCP or Specialist Appt within 5-7 Days   Complete  ?PCP or Specialist Appt within 3-5 Days  Complete   ?Home Care Screening   Complete  ?Medication Review (RN CM)   Complete  ?[place] or Home Care Consult  Complete   ?Social Work Consult for [place] Planning/Counseling  Complete   ?Palliative Care Screening  Complete   ?Medication Review (RN Care Manager) Referral to Pharmacy Complete   ?PCP or Specialist appointment within 3-5 days of discharge Not Complete    ?PCP/Specialist Appt Not Complete comments Patient currently in ICU    ? [place] or Home Care Consult Not Complete    ?[place] or Home Care Consult Pt  Refusal Comments CIR recommended at this time    ?SW Recovery Care/Counseling Consult Complete    ?Palliative Care Screening Not Applicable    ?[place] Not Applicable    ? ? ?

## 2021-09-11 DIAGNOSIS — A419 Sepsis, unspecified organism: Secondary | ICD-10-CM | POA: Diagnosis not present

## 2021-09-11 DIAGNOSIS — N179 Acute kidney failure, unspecified: Secondary | ICD-10-CM | POA: Diagnosis not present

## 2021-09-11 DIAGNOSIS — R6520 Severe sepsis without septic shock: Secondary | ICD-10-CM | POA: Diagnosis not present

## 2021-09-11 LAB — GLUCOSE, CAPILLARY
Glucose-Capillary: 186 mg/dL — ABNORMAL HIGH (ref 70–99)
Glucose-Capillary: 204 mg/dL — ABNORMAL HIGH (ref 70–99)
Glucose-Capillary: 200 mg/dL — ABNORMAL HIGH (ref 70–99)
Glucose-Capillary: 256 mg/dL — ABNORMAL HIGH (ref 70–99)
Glucose-Capillary: 206 mg/dL — ABNORMAL HIGH (ref 70–99)
Glucose-Capillary: 159 mg/dL — ABNORMAL HIGH (ref 70–99)

## 2021-09-11 LAB — RENAL FUNCTION PANEL
Albumin: 1.8 g/dL — ABNORMAL LOW (ref 3.5–5.0)
Anion gap: 7 (ref 5–15)
BUN: 106 mg/dL — ABNORMAL HIGH (ref 8–23)
CO2: 29 mmol/L (ref 22–32)
Calcium: 8.5 mg/dL — ABNORMAL LOW (ref 8.9–10.3)
Chloride: 112 mmol/L — ABNORMAL HIGH (ref 98–111)
Creatinine, Ser: 3.22 mg/dL — ABNORMAL HIGH (ref 0.61–1.24)
GFR, Estimated: 19 mL/min — ABNORMAL LOW (ref >60)
Glucose, Bld: 205 mg/dL — ABNORMAL HIGH (ref 70–99)
Phosphorus: 5.8 mg/dL — ABNORMAL HIGH (ref 2.5–4.6)
Potassium: 4.9 mmol/L (ref 3.5–5.1)
Sodium: 148 mmol/L — ABNORMAL HIGH (ref 135–145)

## 2021-09-11 LAB — CBC
HCT: 28 % — ABNORMAL LOW (ref 39.0–52.0)
Hemoglobin: 8.7 g/dL — ABNORMAL LOW (ref 13.0–17.0)
MCH: 30.5 pg (ref 26.0–34.0)
MCHC: 31.1 g/dL (ref 30.0–36.0)
MCV: 98.2 fL (ref 80.0–100.0)
Platelets: 161 10*3/uL (ref 150–400)
RBC: 2.85 MIL/uL — ABNORMAL LOW (ref 4.22–5.81)
RDW: 13.9 % (ref 11.5–15.5)
WBC: 8.8 10*3/uL (ref 4.0–10.5)
nRBC: 0 % (ref 0.0–0.2)

## 2021-09-11 LAB — CULTURE, RESPIRATORY W GRAM STAIN

## 2021-09-11 MED ORDER — CARVEDILOL 6.25 MG PO TABS
6.2500 mg | ORAL_TABLET | Freq: Two times a day (BID) | ORAL | Status: DC
  Administered 2021-09-11 – 2021-09-12 (×3): 6.25 mg
  Filled 2021-09-11 (×5): qty 1

## 2021-09-11 MED ORDER — INSULIN ASPART 100 UNIT/ML IJ SOLN: 0.0000 [IU] | INTRAMUSCULAR | Status: DC

## 2021-09-11 MED ORDER — DEXMEDETOMIDINE HCL IN NACL 400 MCG/100ML IV SOLN
0.0000 ug/kg/h | INTRAVENOUS | Status: DC
  Administered 2021-09-11 (×2): 0.6 ug/kg/h via INTRAVENOUS
  Administered 2021-09-12 (×2): 0.8 ug/kg/h via INTRAVENOUS
  Filled 2021-09-11 (×3): qty 100

## 2021-09-11 MED ORDER — DEXTROSE 5 % IV SOLN
INTRAVENOUS | Status: AC
Start: 2021-09-11 — End: 2021-09-11

## 2021-09-11 MED ORDER — METHYLPREDNISOLONE SODIUM SUCC 40 MG IJ SOLR
40.0000 mg | INTRAMUSCULAR | Status: AC
  Administered 2021-09-11 – 2021-09-15 (×5): 40 mg via INTRAVENOUS
  Filled 2021-09-11 (×5): qty 1

## 2021-09-11 MED ORDER — INSULIN ASPART 100 UNIT/ML IJ SOLN
0.0000 [IU] | INTRAMUSCULAR | Status: DC
  Administered 2021-09-11: 11 [IU] via SUBCUTANEOUS
  Administered 2021-09-11: 4 [IU] via SUBCUTANEOUS
  Administered 2021-09-11: 7 [IU] via SUBCUTANEOUS
  Administered 2021-09-12: 4 [IU] via SUBCUTANEOUS
  Administered 2021-09-12: 3 [IU] via SUBCUTANEOUS
  Administered 2021-09-12 – 2021-09-13 (×4): 4 [IU] via SUBCUTANEOUS
  Administered 2021-09-13: 3 [IU] via SUBCUTANEOUS
  Administered 2021-09-13: 4 [IU] via SUBCUTANEOUS

## 2021-09-11 MED ORDER — INSULIN ASPART 100 UNIT/ML IJ SOLN
3.0000 [IU] | INTRAMUSCULAR | Status: DC
  Administered 2021-09-11 – 2021-09-12 (×7): 3 [IU] via SUBCUTANEOUS

## 2021-09-11 NOTE — Progress Notes (Signed)
Patient ID: Ricky Hayden, male   DOB: Jun 12, 1945, 76 y.o.   MRN: [id number] ?[facility] ?Progress Note  ? ?Assessment/ Plan:   ?1.  Acute kidney injury on chronic kidney disease stage III:  ATN associated from severe urinary tract infection with sepsis/shock ?- Cr improving though BUN has risen.  He has GI ppx and is not on steroids; he is on anticoagulation. On vital for feeds ?- please hold diuretics today unless needed to optimize cardiopulmonary status ?- no acute indication for renal replacement therapy  ?- continue supportive care ?- discussed with primary team  ? ?2.  Severe urinary tract infection with sepsis: With evidence of Proteus urinary tract infection as well as bacteremia. Abx per primary team and ID  ? ?3.  Acute hypoxic/hypercarbic respiratory failure: Suspected to have had an aspiration event and on coverage for HCAP. ? ?4. CKD stage 3 ?- noted; pre-dates admission  ? ?5.  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 ?6.  Hypernatremia: Secondary to insensible losses/decreased intake, improved a bit with increased free water.  free water at 250 ml every 4 hours to improve tolerance.  D5W at 75 ml/hr x 12 hours  ? ?7. HTN acceptable control. Avoid hypotension  ? ?Disposition - per primary team  ? ?Subjective:   ? he had 1.3 liters UOP over 5/10.  He is getting agitated with SBT per nursing.  They have just increased his sedation.  ? ?Review of systems: unable to obtain 2/2 intubation and sedation    ? ?Objective:   ?BP (!) 142/52   Pulse 68   Temp 98.8 ?F (37.1 ?C) (Oral)   Resp (!) 29   Ht $R'5\' 7"'qK$  (1.702 m)   Wt 107.7 kg   SpO2 100%   BMI 37.19 kg/m?  ? ?Intake/Output Summary (Last 24 hours) at 09/11/2021 0756 ?Last data filed at 09/11/2021 0750 ?Gross per 24 hour  ?Intake 2696.81 ml  ?Output 1910 ml  ?Net 786.81 ml  ? ?Weight change: 1.5 kg ? ?Physical Exam:   ?Gen: elderly male  intubated     ?CVS: S1S2 ; no rub ?HEENT - NCAT  ?Neck trachea midline  ?Resp: clear anteriorly on vent ?Abd: Soft, obese, distended with obese habitus ?Ext: no pitting lower extremity edema ; 1+ edema bilateral hands ?Neuro - on continuous sedation; does not follow commands ?GU foley in place ? ?Imaging: ?DG CHEST PORT 1 VIEW ? ?Result Date: 09/09/2021 ?CLINICAL DATA:  Pneumonia EXAM: PORTABLE CHEST 1 VIEW COMPARISON:  09/05/2021 FINDINGS: Mild cardiac enlargement. Normal vascularity. Negative for heart failure. Endotracheal tube in good position. NG tube in the stomach. Right jugular central venous catheter tip mid SVC. Pacemaker leads unchanged. Left lower lobe airspace disease unchanged. Improvement in mild right lower lobe airspace disease. IMPRESSION: Support lines remain in good position Left lower lobe atelectasis/infiltrate unchanged. Mild improved aeration right lung base. Improved lung volume compared with the prior study. Electronically Signed   By: Franchot Gallo M.D.   On: 09/09/2021 11:12   ? ?Labs: ?BMET ?Recent Labs  ?Lab 09/05/21 ?0313 09/06/21 ?0800 09/07/21 ?0342 09/07/21 ?2150 09/08/21 ?0324 09/08/21 ?0830 09/09/21 ?0415 09/10/21 ?0310 09/11/21 ?0500  ?NA 145 149* 152* 145 147* 146* 144 145 148*  ?K 4.6 4.2 4.8 4.9 5.0 5.0 4.8 5.1 4.9  ?CL 107 117* 115* 111 110  --  106 109 112*  ?CO2 $Rem'30 28 31 29 31  'WDhR$ --  $'29 30 29  'M$ ?GLUCOSE 153* 184* 168* 197* 165*  --  203* 177* 205*  ?BUN 92* 88* 85* 85* 90*  --  98* 98* 106*  ?CREATININE 3.75* 3.45* 3.39* 3.26* 3.45*  --  3.49* 3.34* 3.22*  ?CALCIUM 8.8* 8.2* 8.8* 8.3* 8.7*  --  8.6* 8.6* 8.5*  ?PHOS 3.4 3.2 3.9  --  4.3  --  4.7* 5.5* 5.8*  ? ?CBC ?Recent Labs  ?Lab 09/08/21 ?0324 09/08/21 ?0830 09/09/21 ?0415 09/10/21 ?0310 09/11/21 ?0500  ?WBC 9.9  --  10.0 9.8 8.8  ?HGB 9.3* 10.9* 9.6* 8.9* 8.7*  ?HCT 30.2* 32.0* 30.1* 28.2* 28.0*  ?MCV 99.7  --  96.2 97.6 98.2  ?PLT 150  --  153 152 161  ? ?Medications:   ? ? amiodarone  200 mg Per Tube Daily  ? apixaban  5  mg Per Tube BID  ? carvedilol  6.25 mg Per Tube BID WC  ? chlorhexidine gluconate (MEDLINE KIT)  15 mL Mouth Rinse BID  ? Chlorhexidine Gluconate Cloth  6 each Topical Daily  ? feeding supplement (PROSource TF)  45 mL Per Tube BID  ? free water  250 mL Per Tube Q4H  ? insulin aspart  0-15 Units Subcutaneous Q4H  ? insulin glargine-yfgn  15 Units Subcutaneous Daily  ? ipratropium-albuterol  3 mL Nebulization Q6H  ? mouth rinse  15 mL Mouth Rinse 10 times per day  ? pantoprazole sodium  40 mg Per Tube Daily  ? PHENobarbital  64.8 mg Per Tube BID  ? pravastatin  40 mg Per Tube QPM  ? sodium chloride flush  10-40 mL Intracatheter Q12H  ? ?Claudia Desanctis, MD ?09/11/2021, 8:08 AM ? ? ? ?  ? ?

## 2021-09-11 NOTE — Progress Notes (Signed)
? ?NAME:  Ricky Hayden, MRN:  [id number], DOB:  1945-06-28, LOS: 52 ?ADMISSION DATE:  08/28/2021, CONSULTATION DATE:  5/2 ?REFERRING MD:  Dr. Avon Gully, CHIEF COMPLAINT:  Acute respiratory failure  ? ?History of Present Illness:  ?History obtained from chart review and daughter [name] at bedside. ? ?Mr. Zapf is a 76 yo gentleman w/ pertinent PMH of chronic HFrEF with ICD, a-fib, CKD stage IIIb, COPD who presented to [facility] on 5/2 w/ hematuria x 3 weeks. The week prior to admission he was started on Bactrim for urine cultures showing proteus mirabilis. He began having worsening fever and SOB and was brought to [facility] ED on 4/27.  ? ?At admission he was initially started on BiPAP for SOB. Fever 103 F. Started on empiric abx. CT abdomen showing nonobstructive nephrolithiasis. Urology consulted with no indication for procedure.  Further work up showing sepsis from UTI w/ pyelonephritis. Blood cultures were positive for proteus mirabilis. On Ancef with ID managing antibiotics.  ? ?On 5/2 he began having decreased LOC and increased O2 requirements. Per his RN this began around 11AM and they waited for 2 ABG results before he was placed on BiPAP. Once he was on Bipap he had improvement in saturations. There was concern this was due to volume overload but concerning to give lasix with worsening kidney function.  Abg 7.3/ 61/ 61/ 30. PCCM consulted for hypoxia evaluation and management. ? ?Pertinent  Medical History  ? 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 ?Significant Hospital Events: ?Including procedures, antibiotic start and stop dates in addition to other pertinent events   ?4/27: admitted to [facility] for sepsis 2/2 UTI, started ceftriaxone and azithro ?4/27-5/1 on ceftriaxone ?5/2:  antibiotics deescalated to cefazolin. PCCM consult for hypoxia. Placed on BIPAP ?5/3: Bipap  all night, worsening mental status and hypoxemia, intubated, short PEA arrest after intubation, hypotensive, central line placed, CXR in AM clear, CXR after intuabntion diffuse R sided infiltrates concerning for aspiration vs HAP ?5/4 stable pressors, o2 weaned, Cr elevated, Uop diminished but not oliguric,  some flexure posturing, EEG ordered - negative ?5/5 pressor off, minimal vent settings, seems to follow commands, UOP improved, nephrology consult at family request ?5/6 no acute complications overnight currently tolerating vent wean but requiring 12 pressure repeat.  Renal function this a.m. pending ?5/7 patient received 80 mg IV Lasix overnight due to increased CVP and crackles to bases ?5/10 now tolerating weaning. ? ?Interim History / Subjective:  ?Remains afebrile.  Following commands and tolerating ~2 hr SBTs.  ? ?Labs:  ?Na 148 ?BUN 106 ?Cr 3.22  ?Phos 5.8 ? ?Objective   ?Blood pressure (!) 151/51, pulse 88, temperature 98.8 ?F (37.1 ?C), temperature source Oral, resp. rate (!) 34, height 5\' 7"  (1.702 m), weight 107.7 kg, SpO2 100 %. ?   ?Vent Mode: PRVC ?FiO2 (%):  [40 %] 40 % ?Set Rate:  [22 bmp] 22 bmp ?Vt Set:  [520 mL] 520 mL ?PEEP:  [5 cmH20] 5 cmH20 ?Plateau Pressure:  [16 cmH20-22 cmH20] 16 cmH20  ? ?Intake/Output Summary (Last 24 hours) at 09/11/2021 0902 ?Last data filed at 09/11/2021 0800 ?Gross per 24 hour  ?Intake 2575.2 ml  ?Output 1835 ml  ?Net 740.2 ml  ? ? ?Filed Weights  ? 09/09/21 0415 09/10/21 0320 09/11/21 0500  ?Weight: 106.5 kg 106.2 kg 107.7 kg  ? ? ?Examination: ?General: Acute on  chronic ill-appearing obese elderly male lying in bed in no acute distress ?HEENT: ETT, MM pink/moist, PERRL,  ?Neuro: Opens eyes to verbal stimuli, able to follow commands ?CV: regular rate and rhythm ?PULM: Coarse breath sounds bilaterally ?GI: soft, bowel sounds present, non-tender, non-distended, tolerating TF ?Extremities: warm/dry, trace edema  ?Skin: no rashes or lesions ? ?Resolved Hospital Problem  list   ?Sepsis due to Proteus pyelonephritis ? ?Assessment & Plan:  ? ?Acute respiratory failure with hypoxia  COPD ?Weaned successfully on 10/peep today for 2.5 hours with RR in the 57s. However, tired quickly after and was flipped back to [facility]. Slower wean may be due to underlying COPD, less likely due to underlying pneumonia or fluid overload given normal CXR/BNP. Conversations ongoing with family regarding trial of extubation vs tracheostomy. ? ?-Hold diurese today ?-Continue daily SBT -may be extubated today. ?- Continue Charlyne Mom, Pulmicort.  ?-Dexmedetomidine for comfort. ? ?Acute Kidney Injury on CKD ?Creatinine improving, although BUN has increased. Will hold diuresis and avoid overloading with fluids given patient is taking tube feeds.  ? ?Chronic combined systolic and diastolic CHF, NYHA class 2 ([place]) ?Edema has improved, CVP is normal at 7 and BNP negative. ? ?- Hold diuresis ? ?Septic shock (Resolved) ?Due to Proteus mirabilis urinary tract infection related to BPH. ?Off vasopressors. Intermittent fevers most likely due to aspiration pneumonitis, no treatment necessary. ? ?-Stop antibiotics today. ? ?BPH (benign prostatic hyperplasia) ?On finasteride at home ? ?-Continue doxazosin and Urecholine for possible urinary retention ? ?Brief PEA cardiac arrest post intubation  Chronic systolic and diastolic congestive heart failure  A-fib on Eliquis  Hx of HTN/HLD ? ?- Continue home amiodarone 200 mg daily ?- Continue home eliquis 5 mg BID ?- Hold home carvedilol, hydralazine, Entresto, spironolactone and pravastatin ? ?Acute metabolic encephalopathy ?Patient with waxing and waning alertness, more suggestive of delirium associated with acute illness rather than permanent brain damage from PEA arrest. Will plan to limit sedation while providing appropriate pain management and continue to monitor. ?- Continue precedex drip for better pain control ?- continue fentanyl 25 mg every 30 PRN ?- Continue  clonazepam 0.5 mg BID  ? ?Elevated blood glucose ?CBG has ranged from 134 - 204. Does not appear to have a history of diabetes. Will increase basal insulin. ?- continue  insulin glargine-yfgn to 15 U daily ? ?Best Practice (right click and "Reselect all SmartList Selections" daily)  ? ?Diet/type: tubefeeds ?DVT prophylaxis: DOAC ?GI prophylaxis: PPI ?Lines: N/A ?Foley:  Yes, and it is still needed ?Code Status:  full code ?Last date of multidisciplinary goals of care discussion: Update family daily ? ?CRITICAL CARE ? ?Excell Seltzer, MS4 ?

## 2021-09-11 NOTE — Progress Notes (Signed)
Inpatient Diabetes Program Recommendations ? ?AACE/ADA: New Consensus Statement on Inpatient Glycemic Control (2015) ? ?Target Ranges:  Prepandial:   less than 140 mg/dL ?     Peak postprandial:   less than 180 mg/dL (1-2 hours) ?     Critically ill patients:  140 - 180 mg/dL  ? ?Lab Results  ?Component Value Date  ? GLUCAP 204 (H) 09/11/2021  ? HGBA1C 5.7 (H) 09/02/2021  ? ? ?Review of Glycemic Control ? Latest Reference Range & Units 09/10/21 19:27 09/10/21 23:30 09/11/21 03:33 09/11/21 07:29  ?Glucose-Capillary 70 - 99 mg/dL 199 (H) 134 (H) 186 (H) 204 (H)  ?(H): Data is abnormally high ?Diabetes history: Type 2 DM ?Outpatient Diabetes medications: none ?Current orders for Inpatient glycemic control: Novolog 0-15 Q4H, Semglee 15 units QD ?D5% @75  ml/hr, Vital 1.5 cal @ 28ml/hr ?Inpatient Diabetes Program Recommendations:   ? ?If trends continue to exceed 200's mg/dl could consider to add Novolog 3 units Q4H for tube feed coverage (to be stopped or held in the event tube feeds are stopped).  ? ?Thanks, ?Bronson Curb, MSN, RNC-OB ?Diabetes Coordinator ?[phone] (8a-5p) ? ? ? ?

## 2021-09-12 DIAGNOSIS — N179 Acute kidney failure, unspecified: Secondary | ICD-10-CM | POA: Diagnosis not present

## 2021-09-12 DIAGNOSIS — A419 Sepsis, unspecified organism: Secondary | ICD-10-CM | POA: Diagnosis not present

## 2021-09-12 DIAGNOSIS — R6520 Severe sepsis without septic shock: Secondary | ICD-10-CM | POA: Diagnosis not present

## 2021-09-12 LAB — CBC
HCT: 26.9 % — ABNORMAL LOW (ref 39.0–52.0)
Hemoglobin: 8.4 g/dL — ABNORMAL LOW (ref 13.0–17.0)
MCH: 30.8 pg (ref 26.0–34.0)
MCHC: 31.2 g/dL (ref 30.0–36.0)
MCV: 98.5 fL (ref 80.0–100.0)
Platelets: 154 10*3/uL (ref 150–400)
RBC: 2.73 MIL/uL — ABNORMAL LOW (ref 4.22–5.81)
RDW: 13.8 % (ref 11.5–15.5)
WBC: 6.8 10*3/uL (ref 4.0–10.5)
nRBC: 0 % (ref 0.0–0.2)

## 2021-09-12 LAB — COMPREHENSIVE METABOLIC PANEL
ALT: 128 U/L — ABNORMAL HIGH (ref 0–44)
AST: 90 U/L — ABNORMAL HIGH (ref 15–41)
Albumin: 2 g/dL — ABNORMAL LOW (ref 3.5–5.0)
Alkaline Phosphatase: 135 U/L — ABNORMAL HIGH (ref 38–126)
Anion gap: 8 (ref 5–15)
BUN: 89 mg/dL — ABNORMAL HIGH (ref 8–23)
CO2: 27 mmol/L (ref 22–32)
Calcium: 9.1 mg/dL (ref 8.9–10.3)
Chloride: 116 mmol/L — ABNORMAL HIGH (ref 98–111)
Creatinine, Ser: 2.05 mg/dL — ABNORMAL HIGH (ref 0.61–1.24)
GFR, Estimated: 33 mL/min — ABNORMAL LOW (ref >60)
Glucose, Bld: 232 mg/dL — ABNORMAL HIGH (ref 70–99)
Potassium: 4.4 mmol/L (ref 3.5–5.1)
Sodium: 151 mmol/L — ABNORMAL HIGH (ref 135–145)
Total Bilirubin: 0.6 mg/dL (ref 0.3–1.2)
Total Protein: 7.1 g/dL (ref 6.5–8.1)

## 2021-09-12 LAB — GLUCOSE, CAPILLARY
Glucose-Capillary: 163 mg/dL — ABNORMAL HIGH (ref 70–99)
Glucose-Capillary: 190 mg/dL — ABNORMAL HIGH (ref 70–99)
Glucose-Capillary: 185 mg/dL — ABNORMAL HIGH (ref 70–99)
Glucose-Capillary: 122 mg/dL — ABNORMAL HIGH (ref 70–99)
Glucose-Capillary: 185 mg/dL — ABNORMAL HIGH (ref 70–99)
Glucose-Capillary: 132 mg/dL — ABNORMAL HIGH (ref 70–99)

## 2021-09-12 LAB — MAGNESIUM: Magnesium: 3 mg/dL — ABNORMAL HIGH (ref 1.7–2.4)

## 2021-09-12 MED ORDER — CHLORHEXIDINE GLUCONATE 0.12 % MT SOLN
15.0000 mL | Freq: Two times a day (BID) | OROMUCOSAL | Status: DC
  Administered 2021-09-12 – 2021-09-13 (×2): 15 mL via OROMUCOSAL
  Filled 2021-09-12: qty 15

## 2021-09-12 MED ORDER — SENNA 8.6 MG PO TABS: 1.0000 | ORAL_TABLET | Freq: Every evening | ORAL | Status: DC | PRN

## 2021-09-12 MED ORDER — ACETAMINOPHEN 325 MG PO TABS
650.0000 mg | ORAL_TABLET | Freq: Four times a day (QID) | ORAL | Status: DC | PRN
  Administered 2021-09-17: 650 mg via ORAL
  Filled 2021-09-12: qty 2

## 2021-09-12 MED ORDER — DOCUSATE SODIUM 50 MG/5ML PO LIQD: 100.0000 mg | Freq: Two times a day (BID) | ORAL | Status: DC | PRN

## 2021-09-12 MED ORDER — ORAL CARE MOUTH RINSE
15.0000 mL | Freq: Two times a day (BID) | OROMUCOSAL | Status: DC
  Administered 2021-09-12 – 2021-09-13 (×2): 15 mL via OROMUCOSAL

## 2021-09-12 MED ORDER — APIXABAN 5 MG PO TABS
5.0000 mg | ORAL_TABLET | Freq: Two times a day (BID) | ORAL | Status: DC
  Administered 2021-09-13 – 2021-09-19 (×13): 5 mg via ORAL
  Filled 2021-09-12 (×13): qty 1

## 2021-09-12 MED ORDER — PANTOPRAZOLE 2 MG/ML SUSPENSION
40.0000 mg | Freq: Every day | ORAL | Status: DC
  Administered 2021-09-13: 40 mg via ORAL
  Filled 2021-09-12: qty 20

## 2021-09-12 MED ORDER — DEXTROSE 5 % IV SOLN: INTRAVENOUS | Status: AC

## 2021-09-12 MED ORDER — AMIODARONE HCL 200 MG PO TABS
200.0000 mg | ORAL_TABLET | Freq: Every day | ORAL | Status: DC
  Administered 2021-09-13 – 2021-09-19 (×7): 200 mg via ORAL
  Filled 2021-09-12 (×7): qty 1

## 2021-09-12 MED ORDER — CARVEDILOL 6.25 MG PO TABS
6.2500 mg | ORAL_TABLET | Freq: Two times a day (BID) | ORAL | Status: DC
  Administered 2021-09-13 – 2021-09-19 (×13): 6.25 mg via ORAL
  Filled 2021-09-12 (×13): qty 1

## 2021-09-12 MED ORDER — PRAVASTATIN SODIUM 40 MG PO TABS
40.0000 mg | ORAL_TABLET | Freq: Every evening | ORAL | Status: DC
  Administered 2021-09-12 – 2021-09-18 (×7): 40 mg via ORAL
  Filled 2021-09-12 (×6): qty 1

## 2021-09-12 MED ORDER — WHITE PETROLATUM EX OINT
TOPICAL_OINTMENT | CUTANEOUS | Status: DC | PRN
  Filled 2021-09-12: qty 28.35

## 2021-09-12 MED ORDER — ACETAMINOPHEN 650 MG RE SUPP: 650.0000 mg | Freq: Four times a day (QID) | RECTAL | Status: DC | PRN

## 2021-09-12 MED ORDER — POLYETHYLENE GLYCOL 3350 17 G PO PACK: 17.0000 g | PACK | Freq: Every day | ORAL | Status: DC | PRN

## 2021-09-12 MED ORDER — PHENOBARBITAL 32.4 MG PO TABS
64.8000 mg | ORAL_TABLET | Freq: Two times a day (BID) | ORAL | Status: DC
Start: 2021-09-13 — End: 2021-09-19
  Administered 2021-09-13 – 2021-09-19 (×13): 64.8 mg via ORAL
  Filled 2021-09-12 (×13): qty 2

## 2021-09-12 NOTE — Progress Notes (Signed)
SLP Cancellation Note ? ?Patient Details ?Name: Ricky Hayden ?MRN: [id number] ?DOB: 16-Mar-1946 ? ? ?Cancelled treatment:        Pt extubated 9:53 after 10 days. Would benefit from deferring eval until tomorrow to allow more time off vent. Will plan to see tomorrow.  ? ? ?Houston Siren ?09/12/2021, 2:56 PM ?

## 2021-09-12 NOTE — Procedures (Signed)
Extubation Procedure Note ? ?Patient Details:   ?Name: Ricky Hayden ?DOB: 15-Jan-1946 ?MRN: [id number] ?  ?Airway Documentation:  ?  ?Vent end date: 09/12/21 Vent end time: 0953  ? ?Evaluation ? O2 sats: stable throughout ?Complications: No apparent complications ?Patient did tolerate procedure well. ?Bilateral Breath Sounds: Diminished ?  ?Yes ? ?Patient tolerated wean. Positive cuff leak. Patient extubated to a 3 Lpm Nasal cannula with no complications. No signs of dyspnea or stridor noted. Patient resting comfortably. RN at bedside.  ? ?Myrtie Neither ?09/12/2021, 10:08 AM ? ?

## 2021-09-12 NOTE — Progress Notes (Signed)
? ?NAME:  Ricky Hayden, MRN:  [id number], DOB:  August 28, 1945, LOS: 53 ?ADMISSION DATE:  08/28/2021, CONSULTATION DATE:  5/2 ?REFERRING MD:  Dr. Avon Gully, CHIEF COMPLAINT:  Acute respiratory failure  ? ?History of Present Illness:  ?History obtained from chart review and daughter Ricky Hayden at bedside. ? ?[name] is a 76 yo gentleman w/ pertinent PMH of chronic HFrEF with ICD, a-fib, CKD stage IIIb, COPD who presented to [facility] on 5/2 w/ hematuria x 3 weeks. The week prior to admission he was started on Bactrim for urine cultures showing proteus mirabilis. He began having worsening fever and SOB and was brought to [facility] ED on 4/27.  ? ?At admission he was initially started on BiPAP for SOB. Fever 103 F. Started on empiric abx. CT abdomen showing nonobstructive nephrolithiasis. Urology consulted with no indication for procedure.  Further work up showing sepsis from UTI w/ pyelonephritis. Blood cultures were positive for proteus mirabilis. On Ancef with ID managing antibiotics.  ? ?On 5/2 he began having decreased LOC and increased O2 requirements. Per his RN this began around 11AM and they waited for 2 ABG results before he was placed on BiPAP. Once he was on Bipap he had improvement in saturations. There was concern this was due to volume overload but concerning to give lasix with worsening kidney function.  Abg 7.3/ 61/ 61/ 30. PCCM consulted for hypoxia evaluation and management. ? ?Pertinent  Medical History  ? 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 ?Significant Hospital Events: ?Including procedures, antibiotic start and stop dates in addition to other pertinent events   ?4/27: admitted to [facility] for sepsis 2/2 UTI, started ceftriaxone and azithro ?4/27-5/1 on ceftriaxone ?5/2:  antibiotics deescalated to cefazolin. PCCM consult for hypoxia. Placed on BIPAP ?5/3: Bipap  all night, worsening mental status and hypoxemia, intubated, short PEA arrest after intubation, hypotensive, central line placed, CXR in AM clear, CXR after intuabntion diffuse R sided infiltrates concerning for aspiration vs HAP ?5/4 stable pressors, o2 weaned, Cr elevated, Uop diminished but not oliguric,  some flexure posturing, EEG ordered - negative ?5/5 pressor off, minimal vent settings, seems to follow commands, UOP improved, nephrology consult at family request ?5/6 no acute complications overnight currently tolerating vent wean but requiring 12 pressure repeat.  Renal function this a.m. pending ?5/7 patient received 80 mg IV Lasix overnight due to increased CVP and crackles to bases ?5/10 now tolerating weaning. ? ?Interim History / Subjective:  ?No acute events overnight. Weaning at 10/PEEP this morning. Interactive and responsive to questions, able to follow commands and track finger with eyes.  ? ?Objective   ?Blood pressure 131/73, pulse 68, temperature 98.8 ?F (37.1 ?C), temperature source Axillary, resp. rate (!) 28, height 5\' 7"  (1.702 m), weight 103.9 kg, SpO2 100 %. ?CVP:  [6 mmHg-14 mmHg] 6 mmHg  ?Vent Mode: PSV;CPAP ?FiO2 (%):  [40 %] 40 % ?Set Rate:  [22 bmp] 22 bmp ?Vt Set:  [508 mL-520 mL] 520 mL ?PEEP:  [5 cmH20] 5 cmH20 ?Pressure Support:  [10 cmH20] 10 cmH20 ?Plateau Pressure:  [15 cmH20-18 cmH20] 16 cmH20  ? ?Intake/Output Summary (Last 24 hours) at 09/12/2021 0801 ?Last data filed at 09/12/2021 0700 ?Gross per 24 hour  ?Intake 1979.06 ml  ?Output 2300 ml  ?Net -320.94 ml  ? ? ?Filed Weights  ? 09/10/21 0320 09/11/21 0500 09/12/21 0500  ?Weight:  106.2 kg 107.7 kg 103.9 kg  ? ? ?Examination: ?General: Acute on chronic ill-appearing obese elderly male lying in bed in no acute distress ?HEENT: ETT, MM pink/moist, PERRL,  ?Neuro: Opens eyes to verbal stimuli, able to follow commands, + eye tracking ?CV: Regular rate and rhythm ?PULM: rhonchi, coarse breath sounds bilaterally ?GI: soft, bowel  sounds present, non-tender, non-distended, tolerating TF ?Extremities: warm/dry, trace edema  ?Skin: no rashes or lesions ? ?Resolved Hospital Problem list   ?Sepsis due to Proteus pyelonephritis ? ?Assessment & Plan:  ? ?Acute respiratory failure with hypoxia  COPD ?Has been weaning on PSV for >2 hours for the past 3 days. Given this is his 9th day of intubation, must consider risks and benefits of remaining intubated vs trial of extubation. Risks of prolonged intubation include pulmonary barotrauma, ventilator associated lung injury, and VAP. Risks of premature extubation include cardiovascular dysfunction and respiratory distress. In this patient with a history of PEA arrest, most concerning risk is cardiovascular event. However, as patient is tolerating SBT with RR <40 and is more awake this morning, trial of extubation is appropriate. If patient fails extubation, trach placement will be next step in weaning.  ? ?-Continue daily SBT -may be extubated today. ?- Continue Charlyne Mom, Pulmicort.  ?-Dexmedetomidine for comfort. ? ?Acute Kidney Injury on CKD ?Creatinine improving, although BUN has increased. Will hold diuresis and avoid overloading with fluids given patient is taking tube feeds.  ? ?- f/u BMP ? ?Chronic combined systolic and diastolic CHF, NYHA class 2 ([place]) ?Edema has improved, CVP is normal and BNP negative. ? ?- Hold diuresis ? ?Septic shock (Resolved) ?Due to Proteus mirabilis urinary tract infection related to BPH. ?Off vasopressors. Intermittent fevers most likely due to aspiration pneumonitis, no treatment necessary. ? ?-  Completed antibiotic course ? ?BPH (benign prostatic hyperplasia) ?On finasteride at home ? ?-Continue doxazosin and Urecholine for possible urinary retention ? ?Brief PEA cardiac arrest post intubation  Chronic systolic and diastolic congestive heart failure  A-fib on Eliquis  Hx of HTN/HLD ? ?- Continue home amiodarone 200 mg daily ?- Continue home eliquis 5 mg  BID ?- Hold home carvedilol, hydralazine, Entresto, spironolactone and pravastatin ? ?Acute metabolic encephalopathy ?Patient more awake this morning, fully able to follow commands, track with eyes and is interactive with conversation. Reassuring for extubation trial.  ?- Continue precedex drip as needed ?- continue fentanyl 25 mg every 30 PRN ?- Continue clonazepam 0.5 mg BID  ? ?Elevated blood glucose ?CBG has ranged from 163-206. Does not appear to have a history of diabetes. Will increase basal insulin. ?- continue  insulin glargine-yfgn to 15 U daily ? ?Best Practice (right click and "Reselect all SmartList Selections" daily)  ? ?Diet/type: tubefeeds ?DVT prophylaxis: DOAC ?GI prophylaxis: PPI ?Lines: N/A ?Foley:  Yes, and it is still needed ?Code Status:  full code ?Last date of multidisciplinary goals of care discussion: Update family daily ? ?CRITICAL CARE ? ?Excell Seltzer, MS4 ?

## 2021-09-12 NOTE — Progress Notes (Signed)
Patient ID: Ricky Hayden, male   DOB: [birth year], 76 y.o.   MRN: [id number] ?[facility] ?Progress Note  ? ?Assessment/ Plan:   ?1.  Acute kidney injury on chronic kidney disease stage III:  ATN associated from severe urinary tract infection with sepsis/shock. On GI ppx.  ?- lab has ordered a repeat as they feel previous result not consistent with prior as was "a whole point" lower. Result not released and looks like it has been reordered.  Await labs ?- please hold diuretics today unless needed to optimize cardiopulmonary status. Ok for lasix once if needed.  ?- no acute indication for renal replacement therapy  ?- continue supportive care ? ?2.  Severe urinary tract infection with sepsis: With evidence of Proteus urinary tract infection as well as bacteremia. Abx per primary team and ID  ? ?3.  Acute hypoxic/hypercarbic respiratory failure: Suspected to have had an aspiration event and on coverage for HCAP. ? ?4. CKD stage 3 ?- noted; pre-dates admission  ? ?5.  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 ?6.  Hypernatremia: Secondary to insensible losses/decreased intake, improved a bit with increased free water.  free water at 250 ml every 4 hours to improve tolerance.  S/p D5W IV  ? ?7. HTN acceptable control. Avoid hypotension  ? ?Disposition - per primary team  ? ?Subjective:   ? he had 1.9 liters UOP over 5/11.  Labs are being redrawn but per the lab they were better and didn't seem consistent with prior.  Spoke with daughters at bedside. They are watching out for his heart, too.  ? ?Review of systems: unable to obtain 2/2 intubation; denies air hunger  ? ?Objective:   ?BP 126/71   Pulse 75   Temp 98.8 ?F (37.1 ?C) (Axillary)   Resp (!) 23   Ht $R'5\' 7"'BB$  (1.702 m)   Wt 103.9 kg   SpO2 100%   BMI 35.88 kg/m?  ? ?Intake/Output Summary (Last 24 hours) at 09/12/2021 0902 ?Last data filed at 09/12/2021  0800 ?Gross per 24 hour  ?Intake 2009.06 ml  ?Output 2300 ml  ?Net -290.94 ml  ? ?Weight change: -3.8 kg ? ?Physical Exam:   ?Gen: elderly male intubated     ?CVS: S1S2 ; no rub ?HEENT - NCAT  ?Neck trachea midline  ?Resp: clear anteriorly on vent - 40 FIO2 and PEEP 5 ?Abd: Soft, obese, distended with obese habitus ?Ext: no pitting lower extremity edema ; 1+ edema bilateral hands ?Neuro - has no continuous sedation; interactive and nods and follows commands ?GU foley in place ? ?Imaging: ?No results found. ? ?Labs: ?BMET ?Recent Labs  ?Lab 09/06/21 ?0800 09/07/21 ?0342 09/07/21 ?2150 09/08/21 ?0324 09/08/21 ?0830 09/09/21 ?0415 09/10/21 ?0310 09/11/21 ?0500  ?NA 149* 152* 145 147* 146* 144 145 148*  ?K 4.2 4.8 4.9 5.0 5.0 4.8 5.1 4.9  ?CL 117* 115* 111 110  --  106 109 112*  ?CO2 $Rem'28 31 29 31  'ovGL$ --  $R'29 30 29  'qB$ ?GLUCOSE 184* 168* 197* 165*  --  203* 177* 205*  ?BUN 88* 85* 85* 90*  --  98* 98* 106*  ?CREATININE 3.45* 3.39* 3.26* 3.45*  --  3.49* 3.34* 3.22*  ?CALCIUM 8.2* 8.8* 8.3* 8.7*  --  8.6* 8.6* 8.5*  ?PHOS 3.2 3.9  --  4.3  --  4.7* 5.5* 5.8*  ? ?CBC ?Recent Labs  ?Lab 09/09/21 ?0415 09/10/21 ?0310 09/11/21 ?0500  09/12/21 ?3570  ?WBC 10.0 9.8 8.8 6.8  ?HGB 9.6* 8.9* 8.7* 8.4*  ?HCT 30.1* 28.2* 28.0* 26.9*  ?MCV 96.2 97.6 98.2 98.5  ?PLT 153 152 161 154  ? ?Medications:   ? ? amiodarone  200 mg Per Tube Daily  ? apixaban  5 mg Per Tube BID  ? carvedilol  6.25 mg Per Tube BID WC  ? chlorhexidine gluconate (MEDLINE KIT)  15 mL Mouth Rinse BID  ? Chlorhexidine Gluconate Cloth  6 each Topical Daily  ? feeding supplement (PROSource TF)  45 mL Per Tube BID  ? free water  250 mL Per Tube Q4H  ? insulin aspart  0-20 Units Subcutaneous Q4H  ? insulin aspart  3 Units Subcutaneous Q4H  ? insulin glargine-yfgn  15 Units Subcutaneous Daily  ? ipratropium-albuterol  3 mL Nebulization Q6H  ? mouth rinse  15 mL Mouth Rinse 10 times per day  ? methylPREDNISolone (SOLU-MEDROL) injection  40 mg Intravenous Q24H  ? pantoprazole sodium   40 mg Per Tube Daily  ? PHENobarbital  64.8 mg Per Tube BID  ? pravastatin  40 mg Per Tube QPM  ? ?Claudia Desanctis, MD ?09/12/2021, 9:02 AM ? ? ? ?  ? ?

## 2021-09-13 LAB — CULTURE, BLOOD (ROUTINE X 2)
Culture: NO GROWTH
Culture: NO GROWTH
Special Requests: ADEQUATE

## 2021-09-13 LAB — COMPREHENSIVE METABOLIC PANEL
ALT: 128 U/L — ABNORMAL HIGH (ref 0–44)
AST: 104 U/L — ABNORMAL HIGH (ref 15–41)
Albumin: 1.9 g/dL — ABNORMAL LOW (ref 3.5–5.0)
Alkaline Phosphatase: 105 U/L (ref 38–126)
Anion gap: 5 (ref 5–15)
BUN: 69 mg/dL — ABNORMAL HIGH (ref 8–23)
CO2: 27 mmol/L (ref 22–32)
Calcium: 8.8 mg/dL — ABNORMAL LOW (ref 8.9–10.3)
Chloride: 114 mmol/L — ABNORMAL HIGH (ref 98–111)
Creatinine, Ser: 1.54 mg/dL — ABNORMAL HIGH (ref 0.61–1.24)
GFR, Estimated: 46 mL/min — ABNORMAL LOW (ref >60)
Glucose, Bld: 122 mg/dL — ABNORMAL HIGH (ref 70–99)
Potassium: 4.2 mmol/L (ref 3.5–5.1)
Sodium: 146 mmol/L — ABNORMAL HIGH (ref 135–145)
Total Bilirubin: 0.7 mg/dL (ref 0.3–1.2)
Total Protein: 6.6 g/dL (ref 6.5–8.1)

## 2021-09-13 LAB — CBC
HCT: 28.4 % — ABNORMAL LOW (ref 39.0–52.0)
Hemoglobin: 8.9 g/dL — ABNORMAL LOW (ref 13.0–17.0)
MCH: 30.7 pg (ref 26.0–34.0)
MCHC: 31.3 g/dL (ref 30.0–36.0)
MCV: 97.9 fL (ref 80.0–100.0)
Platelets: 214 10*3/uL (ref 150–400)
RBC: 2.9 MIL/uL — ABNORMAL LOW (ref 4.22–5.81)
RDW: 13.2 % (ref 11.5–15.5)
WBC: 8.5 10*3/uL (ref 4.0–10.5)
nRBC: 0 % (ref 0.0–0.2)

## 2021-09-13 LAB — GLUCOSE, CAPILLARY
Glucose-Capillary: 103 mg/dL — ABNORMAL HIGH (ref 70–99)
Glucose-Capillary: 98 mg/dL (ref 70–99)
Glucose-Capillary: 152 mg/dL — ABNORMAL HIGH (ref 70–99)
Glucose-Capillary: 151 mg/dL — ABNORMAL HIGH (ref 70–99)
Glucose-Capillary: 124 mg/dL — ABNORMAL HIGH (ref 70–99)

## 2021-09-13 MED ORDER — INSULIN ASPART 100 UNIT/ML IJ SOLN
0.0000 [IU] | Freq: Three times a day (TID) | INTRAMUSCULAR | Status: DC
  Administered 2021-09-14: 4 [IU] via SUBCUTANEOUS
  Administered 2021-09-14: 7 [IU] via SUBCUTANEOUS
  Administered 2021-09-15 – 2021-09-19 (×7): 3 [IU] via SUBCUTANEOUS

## 2021-09-13 MED ORDER — MELATONIN 3 MG PO TABS
3.0000 mg | ORAL_TABLET | Freq: Every day | ORAL | Status: DC
  Administered 2021-09-13 – 2021-09-18 (×6): 3 mg via ORAL
  Filled 2021-09-13 (×6): qty 1

## 2021-09-13 MED ORDER — PANTOPRAZOLE SODIUM 40 MG PO TBEC
40.0000 mg | DELAYED_RELEASE_TABLET | Freq: Every day | ORAL | Status: DC
Start: 2021-09-14 — End: 2021-09-19
  Administered 2021-09-14 – 2021-09-19 (×6): 40 mg via ORAL
  Filled 2021-09-13 (×6): qty 1

## 2021-09-13 MED ORDER — FUROSEMIDE 10 MG/ML IJ SOLN
40.0000 mg | Freq: Once | INTRAMUSCULAR | Status: AC
Start: 2021-09-13 — End: 2021-09-13
  Administered 2021-09-13: 40 mg via INTRAVENOUS
  Filled 2021-09-13: qty 4

## 2021-09-13 NOTE — Evaluation (Signed)
Occupational Therapy Evaluation ?Patient Details ?Name: Ricky Hayden ?MRN: [id number] ?DOB: 1945/12/31 ?Today's Date: 09/13/2021 ? ? ?History of Present Illness This 76 yo male from home presented to hospital on 4/28 with fever, chills and SOB. Admitted with sepsis secondary to nonobstructing nephrolithiasis/pyelonephritis and concurrent UTI, acute hypoxic respiratory failure secondary to volume overload, and AKI on CKD. 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  ? ?Clinical Impression ?  ?Patient is currently requiring assistance with ADLs including Total assist with Lower body ADLs, maximum assist with Upper body ADLs,  as well as  moderate assist with feeding and grooming at bed level.  Per PT evaluation earlier today, bed mobility requires Total assist of 2 people just to roll, therefore OT worked with pt in supine on exercises and head position as well as elevating BUEs on pillows for edema management.  ?During this evaluation, patient was limited by head fixation in lateal lateral flexion and rotation with pain on attempts to turn head to RT on his own. Pt tolerated gentle neck traction and passive stretches well and achieved midline head orientation but is quick to return to lateral LT flexion and could benefit from postioning help from possible travel pillow or similar.  Further limitations include pain with LT UE ROM, profound generalized weakness, impaired activity tolerance, and cognitive deficits, all of which has the potential to impact patient's and caregiver's safety and independence during functional mobility, as well as performance for ADLs.  Patient lives alone, but has family who are able to provide 24/7 supervision and assistance.  Patient demonstrates good rehab potential, and should benefit from continued skilled occupational therapy services while in acute care to maximize safety,  independence and quality of life at home.  Continued occupational therapy services in a AIR setting prior to return home is recommended. ? ?? ? ?   ? ?Recommendations for follow up therapy are one component of a multi-disciplinary discharge planning process, led by the attending physician.  Recommendations may be updated based on patient status, additional functional criteria and insurance authorization.  ? ?Follow Up Recommendations ? Acute inpatient rehab (3hours/day)  ?  ?Assistance Recommended at Discharge Frequent or constant Supervision/Assistance  ?Patient can return home with the following A lot of help with walking and/or transfers;A lot of help with bathing/dressing/bathroom;Assistance with cooking/housework;Assistance with feeding;Help with stairs or ramp for entrance;Assist for transportation;Direct supervision/assist for financial management;Direct supervision/assist for medications management ? ?  ?Functional Status Assessment ? Patient has had a recent decline in their functional status and demonstrates the ability to make significant improvements in function in a reasonable and predictable amount of time.  ?Equipment Recommendations ?  (Will defer to post-acute recommendations)  ?  ?Recommendations for Other Services Rehab consult ? ? ?  ?Precautions / Restrictions Precautions ?Precautions: Fall ?Precaution Comments: watch 02 ?Restrictions ?Weight Bearing Restrictions: No  ? ?  ? ?Mobility Bed Mobility ?Overal bed mobility: Needs Assistance ?Bed Mobility: Rolling ?Rolling: +2 for physical assistance, Total assist (per PT note.) ?  ?  ?  ?  ?  ?  ? ?Transfers ?Overall transfer level: Needs assistance ?  ?  ?  ?  ?  ?  ?  ?  ?  ?  ? ?  ?Balance Overall balance assessment:  (Unable to assess today. Will need 2 people for safety) ?  ?  ?  ?  ?  ?  ?  ?  ?  ?  ?  ?  ?  ?  ?  ?  ?  ?  ?   ? ?  ADL either performed or assessed with clinical judgement  ? ?ADL Overall ADL's : Needs  assistance/impaired ?Eating/Feeding: Moderate assistance;Bed level ?Eating/Feeding Details (indicate cue type and reason): May benefit from adaptive utensils ?Grooming: Dance movement psychotherapist;Moderate assistance;Bed level ?Grooming Details (indicate cue type and reason): Cued to bring RT hand to face as pt trying to lower head to hand thus exacerbating LT lateral flexion. Pt able to correct with cues and wash eyes and lower 1/2 of face. Required Moderate assist for thoroughness. ?Upper Body Bathing: Maximal assistance;Bed level ?  ?Lower Body Bathing: +2 for physical assistance;Bed level;Total assistance ?  ?Upper Body Dressing : Maximal assistance;Bed level ?  ?Lower Body Dressing: Total assistance;Bed level;+2 for physical assistance ?  ?  ?Toilet Transfer Details (indicate cue type and reason): Unable. Would require assist of 2 for safety. ?Toileting- Water quality scientist and Hygiene: +2 for physical assistance;Bed level;Total assistance ?Toileting - Clothing Manipulation Details (indicate cue type and reason): Currently with flexiseal. ?  ?  ?  ?   ? ? ? ?Vision   ?Additional Comments: Pt attempting to read dry erase board in room with about 50% accuracy.  ?   ?Perception   ?  ?Praxis Praxis ?Praxis: Impaired ?Praxis Impairment Details: Initiation;Motor planning ?  ? ?Pertinent Vitals/Pain Pain Assessment ?Pain Assessment: Faces ?Faces Pain Scale: Hurts even more ?Pain Location: "All over". Later pt able to verbalize pain to LT neck with LUE ROM ?Pain Descriptors / Indicators: Discomfort, Grimacing, Tightness ?Pain Intervention(s): Limited activity within patient's tolerance, Monitored during session, Repositioned  ? ? ? ?Hand Dominance Right ?  ?Extremity/Trunk Assessment Upper Extremity Assessment ?Upper Extremity Assessment: RUE deficits/detail;LUE deficits/detail ?RUE Deficits / Details: Edema with chronic numbness to RT D5 since 04/2021. AAROM: WFL. MMT: shoulder 2+/5, elbow: 3-/5, wrist 3-/5, grip: 3+/5 but  limited composite grip from edema. ?RUE Sensation: decreased light touch ?RUE Coordination: decreased fine motor (Opposition absent to D5) ?LUE Deficits / Details: Edema on LT greater than RT. Chronic numbness to LT D5 since 04/2021.  AAROM: WFL limiting shoulder to ~120 due to neck lateral LT flexion blocking further shoulder movement. MMT: Shoulder 2/5, elbow: 2+/5, wrist 2+/5, grip: 3/5 with limited composite grip due to edema. ?LUE: Shoulder pain with ROM ?LUE Sensation: decreased light touch ?LUE Coordination: decreased fine motor;decreased gross motor (Opposition absent to D4 and D5) ?  ?Lower Extremity Assessment ?Lower Extremity Assessment: Defer to PT evaluation ?  ?Cervical / Trunk Assessment ?Cervical / Trunk Assessment: Other exceptions ?Cervical / Trunk Exceptions: Head positioned in left lateral flexion and left rotation. ?  ?Communication Communication ?Communication: No difficulties ?  ?Cognition Arousal/Alertness: Awake/alert ?Behavior During Therapy: Flat affect ?Overall Cognitive Status: Impaired/Different from baseline ?Area of Impairment: Attention, Problem solving, Following commands, Orientation, Memory, Safety/judgement ?  ?  ?  ?  ?  ?  ?  ?  ?Orientation Level: Disoriented to, Time, Situation ?Current Attention Level: Focused, Sustained ?Memory: Decreased short-term memory ?Following Commands: Follows one step commands with increased time, Follows one step commands inconsistently ?Safety/Judgement: Decreased awareness of safety ?  ?Problem Solving: Slow processing, Difficulty sequencing, Requires verbal cues, Requires tactile cues, Decreased initiation ?General Comments: Followed one step commands with multimodal cues ?  ?  ?General Comments    ? ?  ?Exercises Shoulder Exercises ?Neck Flexion: AROM, 5 reps, Supine ?Neck Extension: AROM, 5 reps, Supine ?Other Exercises ?Other Exercises: Pt performed 2 reps of neck rotation to RT and LT as tolerated (very limited) and gentle neck extension  followed by  neck flexion x 4 reps. OT then provided gentle cervical traction with pt supine and cues to release head. Traction sustained about 2 min followed by assisted PROM to neck with pt tolerating head being positioned in midline for

## 2021-09-13 NOTE — Progress Notes (Signed)
? ?NAME:  Caine Barfield, MRN:  [id number], DOB:  [birth year], LOS: 39 ?ADMISSION DATE:  08/28/2021, CONSULTATION DATE:  5/2 ?REFERRING MD:  Dr. Avon Gully, CHIEF COMPLAINT:  Acute respiratory failure  ? ?History of Present Illness:  ?History obtained from chart review and daughter [name] at bedside. ? ?Mr. Schnyder is a 76 yo gentleman w/ pertinent PMH of chronic HFrEF with ICD, a-fib, CKD stage IIIb, COPD who presented to [facility] on 5/2 w/ hematuria x 3 weeks. The week prior to admission he was started on Bactrim for urine cultures showing proteus mirabilis. He began having worsening fever and SOB and was brought to [facility] ED on 4/27.  ? ?At admission he was initially started on BiPAP for SOB. Fever 103 F. Started on empiric abx. CT abdomen showing nonobstructive nephrolithiasis. Urology consulted with no indication for procedure.  Further work up showing sepsis from UTI w/ pyelonephritis. Blood cultures were positive for proteus mirabilis. On Ancef with ID managing antibiotics.  ? ?On 5/2 he began having decreased LOC and increased O2 requirements. Per his RN this began around 11AM and they waited for 2 ABG results before he was placed on BiPAP. Once he was on Bipap he had improvement in saturations. There was concern this was due to volume overload but concerning to give lasix with worsening kidney function.  Abg 7.3/ 61/ 61/ 30. PCCM consulted for hypoxia evaluation and management. ? ?Pertinent  Medical History  ? 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 ?Significant Hospital Events: ?Including procedures, antibiotic start and stop dates in addition to other pertinent events   ?4/27: admitted to [facility] for sepsis 2/2 UTI, started ceftriaxone and azithro ?4/27-5/1 on ceftriaxone ?5/2:  antibiotics deescalated to cefazolin. PCCM consult for hypoxia. Placed on BIPAP ?5/3: Bipap  all night, worsening mental status and hypoxemia, intubated, short PEA arrest after intubation, hypotensive, central line placed, CXR in AM clear, CXR after intuabntion diffuse R sided infiltrates concerning for aspiration vs HAP ?5/4 stable pressors, o2 weaned, Cr elevated, Uop diminished but not oliguric,  some flexure posturing, EEG ordered - negative ?5/5 pressor off, minimal vent settings, seems to follow commands, UOP improved, nephrology consult at family request ?5/6 no acute complications overnight currently tolerating vent wean but requiring 12 pressure repeat.  Renal function this a.m. pending ?5/7 patient received 80 mg IV Lasix overnight due to increased CVP and crackles to bases ?5/10 now tolerating weaning. ?5/12 extubated ? ?Interim History / Subjective:  ?Extubated yesterday and doing well.  Able to handle oral intake. ? ?Objective   ?Blood pressure (!) 106/58, pulse (!) 58, temperature 98.1 ?F (36.7 ?C), temperature source Oral, resp. rate (!) 25, height 5\' 7"  (1.702 m), weight 103.9 kg, SpO2 94 %. ?   ?   ? ?Intake/Output Summary (Last 24 hours) at 09/13/2021 1249 ?Last data filed at 09/13/2021 1100 ?Gross per 24 hour  ?Intake 2793.96 ml  ?Output 3775 ml  ?Net -981.04 ml  ? ? ?Filed Weights  ? 09/10/21 0320 09/11/21 0500 09/12/21 0500  ?Weight: 106.2 kg 107.7 kg 103.9 kg  ? ? ?Examination: ?General: Acute on chronic ill-appearing obese elderly male lying in bed in no acute distress ?HEENT: ETT, MM pink/moist, PERRL,  ?Neuro: Opens eyes to verbal stimuli, able to follow commands, + eye tracking ?CV: Regular rate and rhythm ?PULM: rhonchi, coarse breath sounds bilaterally ?GI: soft, bowel  sounds present, non-tender, non-distended, tolerating TF ?Extremities: warm/dry, trace edema  ?Skin: no rashes or lesions ? ?Resolved Hospital Problem list   ?Sepsis due to Proteus pyelonephritis ? ?Assessment & Plan:  ? ?Acute respiratory failure with hypoxia ([place]) ?Successfully extubated yesterday and in no  distress. ? ?-Ready for transfer to PCU.  Orders reconciled, hospitalist notified ? ?COPD (chronic obstructive pulmonary disease) ([place]) ?Occasional wheezing on examination has improved.  Still copious secretions.  No PFTs since 2015. ? ?Charlyne Mom, Pulmicort.  ?-Short course steroids for COPD exacerbation, should help with secretions. ? ?Chronic combined systolic and diastolic CHF, NYHA class 2 ([place]) ?Edema has improved, elevated CVP at 14 but BNP negative. ? ?-Hold further diuresis as volume status equivocal. ? ?NICM (nonischemic cardiomyopathy) ([place]) ?EF 40 to 45%. ? ?-Not clearly volume overloaded.. ? ?Septic shock ([place]) ?Due to Proteus mirabilis urinary tract infection related to BPH. ?Off vasopressors. ? ?-Complete 10-day course of antibiotics. ? ?Bacteremia due to Gram-negative bacteria ?Proteus (mirabilis) ([place]) as the cause of diseases classified elsewhere ?Complicated UTI (urinary tract infection) ?BPH (benign prostatic hyperplasia) ? ?-Resume home finasteride ? ?ARF (acute renal failure) ([place]) ?Mixed picture.  Improving off diuretics. ?CKD (chronic kidney disease) stage 3, GFR 30-59 ml/min (HCC) ? ?Best Practice (right click and "Reselect all SmartList Selections" daily)  ? ?Diet/type: Advancing oral diet ?DVT prophylaxis: DOAC ?GI prophylaxis: Continue home PPI ?Lines: N/A ?Foley: Has been discontinued ?Code Status:  full code ?Last date of multidisciplinary goals of care discussion: Update family daily ? ?Kipp Brood, MD FRCPC ?ICU Physician ?[facility]  ?Pager: [phone] ?Or Epic Secure Chat ?After hours: [phone]. ? ?09/13/2021, 12:53 PM ? ? ? ? ?

## 2021-09-13 NOTE — Progress Notes (Deleted)
Physical Therapy Treatment ?Patient Details ?Name: Ricky Hayden ?MRN: [id number] ?DOB: 1946-04-11 ?Today's Date: 09/13/2021 ? ? ?History of Present Illness This 76 yo male from home presented to hospital on 4/28 with fever, chills and SOB. Admitted with sepsis secondary to nonobstructing nephrolithiasis/pyelonephritis and concurrent UTI, acute hypoxic respiratory failure secondary to volume overload, and AKI on CKD. 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 ? ?  ?PT Comments  ? ? Pt admitted secondary to problem above with deficits below. PTA patient was living alone and walking without a device inside his home and rollator when walking longer distances in community.  Pt currently requires total assist for all mobility, however he does attempt to assist and is very weak. With only +1 assist during evaluation, unable to assess ability to come to sit EOB or potentially try a transfer. Will continue to monitor discharge planning needs as pt better able to mobilize with +2 assist.  Anticipate patient will benefit from PT to address problems listed below.Will continue to follow acutely to maximize functional mobility independence and safety.   ?   ?Recommendations for follow up therapy are one component of a multi-disciplinary discharge planning process, led by the attending physician.  Recommendations may be updated based on patient status, additional functional criteria and insurance authorization. ? ?Follow Up Recommendations ? Acute inpatient rehab (3hours/day) (will continue to assess as +2 available) ?  ?  ?Assistance Recommended at Discharge Frequent or constant Supervision/Assistance  ?Patient can return home with the following Assist for transportation;Help with stairs or ramp for entrance;Direct supervision/assist for medications management;Assistance with cooking/housework;Direct supervision/assist  for financial management;Two people to help with walking and/or transfers;Two people to help with bathing/dressing/bathroom ?  ?Equipment Recommendations ? None recommended by PT  ?  ?Recommendations for Other Services OT consult ? ? ?  ?Precautions / Restrictions Precautions ?Precautions: Fall ?Precaution Comments: watch 02 ?Restrictions ?Weight Bearing Restrictions: No  ?  ? ?Mobility ? Bed Mobility ?Overal bed mobility: Needs Assistance ?Bed Mobility: Rolling, Supine to Sit ?Rolling: +2 for physical assistance, Total assist ?  ?Supine to sit: Total assist, HOB elevated (via chair position in ICU bed) ?  ?  ?General bed mobility comments: Assist for all aspects ?  ? ?Transfers ?  ?  ?  ?  ?  ?  ?  ?  ?  ?General transfer comment: unsafe to attempt with +1 assist and will likely need lift equipment ?  ? ?Ambulation/Gait ?  ?  ?  ?  ?  ?  ?  ?  ? ? ?Stairs ?  ?  ?  ?  ?  ? ? ?Wheelchair Mobility ?  ? ?Modified Rankin (Stroke Patients Only) ?  ? ? ?  ?Balance Overall balance assessment: Needs assistance, History of Falls ?Sitting-balance support: Feet supported, Bilateral upper extremity supported ?Sitting balance-Leahy Scale: Poor ?Sitting balance - Comments: Sat in ICU chair position x 8 minutes ?  ?  ?  ?  ?  ?  ?  ?  ?  ?  ?  ?  ?  ?  ?  ?  ? ?  ?Cognition Arousal/Alertness: Awake/alert ?Behavior During Therapy: Flat affect ?Overall Cognitive Status: Impaired/Different from baseline ?Area of Impairment: Attention, Problem solving, Following commands, Orientation, Memory, Safety/judgement ?  ?  ?  ?  ?  ?  ?  ?  ?Orientation Level: Disoriented to, Time, Situation, Person (stating  his name as "Hollice Espy" (apparently an uncle of his); could state DOB, location) ?Current Attention Level: Focused, Sustained ?Memory: Decreased short-term memory (didn't recall his dog's name (also stated "Hollice Espy") or his home address) ?Following Commands: Follows one step commands with increased time, Follows one step commands  inconsistently ?  ?  ?Problem Solving: Slow processing, Difficulty sequencing, Requires verbal cues, Requires tactile cues, Decreased initiation ?General Comments: Followed one step commands with multimodal cues x 4 extremities and head/neck ?  ?  ? ?  ?Exercises General Exercises - Lower Extremity ?Long Arc Quad: AROM, Both, 5 reps ? ?  ?General Comments General comments (skin integrity, edema, etc.): Son present. Informed PT that his sister [name] wanted PT to call before working with pt. Spoke with [name] and she actually wants the Rehab Admissions Coordinator to call her when appropriate. Explained it likely will be 2-3 days before they might call. She was understanding of process. ?  ?  ? ?Pertinent Vitals/Pain Pain Assessment ?Pain Assessment: Faces ?Faces Pain Scale: Hurts even more ?Pain Location: rt knee ?Pain Descriptors / Indicators: Discomfort, Grimacing ?Pain Intervention(s): Limited activity within patient's tolerance, Monitored during session, Repositioned  ? ? ?Home Living Family/patient expects to be discharged to:: Private residence ?Living Arrangements: Alone (children stay at night and on the weekends) ?Available Help at Discharge: Family;Available 24 hours/day (per rehab admissions note, daughter can provide 24/7) ?Type of Home: House ?Home Access: Stairs to enter ?Entrance Stairs-Rails: None ?Entrance Stairs-Number of Steps: 3 ?  ?Home Layout: One level ?Home Equipment: Rollator (4 wheels);Hospital bed;Shower seat ?   ?  ?Prior Function    ?  ?  ?   ? ?PT Goals (current goals can now be found in the care plan section) Acute Rehab PT Goals ?Patient Stated Goal: go home ?PT Goal Formulation: With patient ?Time For Goal Achievement: 09/27/21 ?Potential to Achieve Goals: Fair ? ?  ?Frequency ? ? ? Min 3X/week ? ? ? ?  ?PT Plan    ? ? ?Co-evaluation   ?  ?  ?  ?  ? ?  ?AM-PAC PT "6 Clicks" Mobility   ?Outcome Measure ? Help needed turning from your back to your side while in a flat bed without using  bedrails?: Total ?Help needed moving from lying on your back to sitting on the side of a flat bed without using bedrails?: Total ?Help needed moving to and from a bed to a chair (including a wheelchair)?: Total ?Help needed standing up from a chair using your arms (e.g., wheelchair or bedside chair)?: Total ?Help needed to walk in hospital room?: Total ?Help needed climbing 3-5 steps with a railing? : Total ?6 Click Score: 6 ? ?  ?End of Session Equipment Utilized During Treatment: Oxygen ?Activity Tolerance: Patient tolerated treatment well ?Patient left: in bed;with call bell/phone within reach;with bed alarm set;with nursing/sitter in room;with family/visitor present (Rapid response nurse present) ?Nurse Communication: Mobility status ?PT Visit Diagnosis: Muscle weakness (generalized) (M62.81);Difficulty in walking, not elsewhere classified (R26.2);Unsteadiness on feet (R26.81) ?  ? ? ?Time: 1106-1140 ?PT Time Calculation (min) (ACUTE ONLY): 34 min ? ?Charges:  $Therapeutic Exercise: 8-22 mins          ?          ? ? ?Arby Barrette, PT ?Acute Rehabilitation Services  ?Pager [phone] ?Office [phone] ? ? ? ?Jeanie Cooks Kyrsten Deleeuw ?09/13/2021, 12:10 PM ? ?

## 2021-09-13 NOTE — Evaluation (Signed)
Physical Therapy Evaluation Patient Details Name: Ricky Hayden MRN: [id number] DOB: 1946-02-28 Today's Date: 09/13/2021  History of Present Illness  This 76 yo male from home presented to hospital on 4/28 with fever, chills and SOB. Admitted with sepsis secondary to nonobstructing nephrolithiasis/pyelonephritis and concurrent UTI, acute hypoxic respiratory failure secondary to volume overload, and AKI on CKD. 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  Clinical Impression   Pt admitted secondary to problem above with deficits below. PTA patient was living alone and walking inside with no device and longer community distances with rollator (per son).  Pt currently requires total assist for bed level mobility and unable to attempt EOB with +1 assist (utilized ICU chair position). Will continue to monitor discharge planning needs as pt becomes more mobile with +2 assist and lift equipment.  Anticipate patient will benefit from PT to address problems listed below.Will continue to follow acutely to maximize functional mobility independence and safety.          Recommendations for follow up therapy are one component of a multi-disciplinary discharge planning process, led by the attending physician.  Recommendations may be updated based on patient status, additional functional criteria and insurance authorization.  Follow Up Recommendations Acute inpatient rehab (3hours/day) (will continue to assess as +2 available)    Assistance Recommended at Discharge Frequent or constant Supervision/Assistance  Patient can return home with the following  Assist for transportation;Help with stairs or ramp for entrance;Direct supervision/assist for medications management;Assistance with cooking/housework;Direct supervision/assist for financial management;Two people to help with walking and/or transfers;Two  people to help with bathing/dressing/bathroom    Equipment Recommendations None recommended by PT  Recommendations for Other Services  OT consult    Functional Status Assessment Patient has had a recent decline in their functional status and demonstrates the ability to make significant improvements in function in a reasonable and predictable amount of time.     Precautions / Restrictions Precautions Precautions: Fall Precaution Comments: watch 02 Restrictions Weight Bearing Restrictions: No      Mobility  Bed Mobility Overal bed mobility: Needs Assistance Bed Mobility: Rolling, Supine to Sit Rolling: +2 for physical assistance, Total assist   Supine to sit: Total assist, HOB elevated (via chair position in ICU bed)     General bed mobility comments: Assist for all aspects    Transfers                   General transfer comment: unsafe to attempt with +1 assist and will likely need lift equipment    Ambulation/Gait                  Stairs            Wheelchair Mobility    Modified Rankin (Stroke Patients Only)       Balance Overall balance assessment: Needs assistance, History of Falls Sitting-balance support: Feet supported, Bilateral upper extremity supported Sitting balance-Leahy Scale: Poor Sitting balance - Comments: Sat in ICU chair position x 8 minutes                                     Pertinent Vitals/Pain Pain Assessment Pain Assessment: Faces Faces Pain Scale: Hurts even more Pain Location: rt knee Pain Descriptors / Indicators: Discomfort, Grimacing Pain Intervention(s): Limited activity within patient's tolerance, Monitored during session,  Repositioned    Home Living Family/patient expects to be discharged to:: Private residence Living Arrangements: Alone (children stay at night and on the weekends) Available Help at Discharge: Family;Available 24 hours/day (per rehab admissions note, daughter can  provide 24/7) Type of Home: House Home Access: Stairs to enter Entrance Stairs-Rails: None Entrance Stairs-Number of Steps: 3   Home Layout: One level Home Equipment: Rollator (4 wheels);Hospital bed;Shower seat      Prior Function Prior Level of Function : Needs assist             Mobility Comments: uses rollator for ambulation in community if longer distance; walks without device in home and short community distances, children mostly manage IADLs ADLs Comments: Needs help sometimes per report. Drives sometimes.     Hand Dominance   Dominant Hand: Right    Extremity/Trunk Assessment   Upper Extremity Assessment Upper Extremity Assessment: Generalized weakness (generally edematous; shoulder limitations; ordered OT for further assessment)    Lower Extremity Assessment Lower Extremity Assessment: RLE deficits/detail;LLE deficits/detail RLE Deficits / Details: AAROM hip/knee flexion (supine) to 90 degrees with wincing at end ROM x 5 reps; limited hip IR and abduction; ankle DF to neutral with stretch; strength grossly 2+/5 LLE Deficits / Details: AAROM hip/knee flexion (supine) to 100 degrees x 5 reps; limited hip IR and abduction; ankle DF to 10* with stretch; strength grossly 2+/5    Cervical / Trunk Assessment Cervical / Trunk Assessment: Other exceptions Cervical / Trunk Exceptions: Heavy set with abdomen limiting his ability to pull his torso forward to unsupported sitting with ICU bed in chair position (using bed rails with mod assist could clear his back to mid-thoracic level) x 5 reps; head positioned in left lateral flexion and left rotation; AAROM/AROM x 5-10 reps each  Communication   Communication: No difficulties  Cognition Arousal/Alertness: Awake/alert Behavior During Therapy: Flat affect Overall Cognitive Status: Impaired/Different from baseline Area of Impairment: Attention, Problem solving, Following commands, Orientation, Memory, Safety/judgement                  Orientation Level: Disoriented to, Time, Situation, Person (stating his name as "Ricky Hayden" (apparently an uncle of his); could state DOB, location) Current Attention Level: Focused, Sustained Memory: Decreased short-term memory (didn't recall his dog's name (also stated "Ricky Hayden") or his home address) Following Commands: Follows one step commands with increased time, Follows one step commands inconsistently     Problem Solving: Slow processing, Difficulty sequencing, Requires verbal cues, Requires tactile cues, Decreased initiation General Comments: Followed one step commands with multimodal cues x 4 extremities and head/neck        General Comments General comments (skin integrity, edema, etc.): Son present. Informed PT that his sister Morrie Sheldon wanted PT to call before working with pt. Spoke with Morrie Sheldon and she actually wants the Rehab Admissions Coordinator to call her when appropriate. Explained it likely will be 2-3 days before they might call. She was understanding of process.    Exercises General Exercises - Lower Extremity Long Arc Quad: AROM, Both, 5 reps   Assessment/Plan    PT Assessment Patient needs continued PT services  PT Problem List Decreased strength;Decreased mobility;Cardiopulmonary status limiting activity;Decreased activity tolerance;Decreased balance;Decreased cognition;Decreased range of motion;Decreased knowledge of use of DME;Obesity;Pain       PT Treatment Interventions Therapeutic exercise;Gait training;Patient/family education;Therapeutic activities;Functional mobility training;Balance training;Cognitive remediation;DME instruction    PT Goals (Current goals can be found in the Care Plan section)  Acute Rehab PT Goals Patient Stated Goal:  go home PT Goal Formulation: With patient Time For Goal Achievement: 09/27/21 Potential to Achieve Goals: Fair    Frequency Min 3X/week     Co-evaluation               AM-PAC PT "6 Clicks"  Mobility  Outcome Measure Help needed turning from your back to your side while in a flat bed without using bedrails?: Total Help needed moving from lying on your back to sitting on the side of a flat bed without using bedrails?: Total Help needed moving to and from a bed to a chair (including a wheelchair)?: Total Help needed standing up from a chair using your arms (e.g., wheelchair or bedside chair)?: Total Help needed to walk in hospital room?: Total Help needed climbing 3-5 steps with a railing? : Total 6 Click Score: 6    End of Session Equipment Utilized During Treatment: Oxygen Activity Tolerance: Patient tolerated treatment well Patient left: in bed;with call bell/phone within reach;with bed alarm set;with nursing/sitter in room;with family/visitor present (Rapid response nurse present) Nurse Communication: Mobility status PT Visit Diagnosis: Muscle weakness (generalized) (M62.81);Difficulty in walking, not elsewhere classified (R26.2);Unsteadiness on feet (R26.81)    Time: 1106-1140 PT Time Calculation (min) (ACUTE ONLY): 34 min   Charges:   PT Evaluation $PT Eval Moderate Complexity: 1 Mod PT Treatments $Therapeutic Exercise: 8-22 mins         Jerolyn Center, PT Acute Rehabilitation Services  Pager [phone] Office [phone]   Zena Amos 09/13/2021, 12:14 PM

## 2021-09-13 NOTE — Evaluation (Signed)
Clinical/Bedside Swallow Evaluation ?Patient Details  ?Name: Ricky Hayden ?MRN: [id number] ?Date of Birth: 19-May-1945 ? ?Today's Date: 09/13/2021 ?Time: SLP Start Time (ACUTE ONLY): [id number] SLP Stop Time (ACUTE ONLY): 7371 ?SLP Time Calculation (min) (ACUTE ONLY): 25 min ? ?Past Medical History:  ?Past Medical History:  ?Diagnosis Date  ? Arthritis   ? osteoarthritis of left knee  ? Ascending aorta dilatation (HCC)   ? 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 Cardiac arrhythmia   ? life threatening, secondary to CCB vs b- blockers  ? Cardiomyopathy ([place])   ? Chronic joint pain   ? Chronic systolic CHF (congestive heart failure) ([place])   ? CKD (chronic kidney disease), stage III ([place])   ? COPD (chronic obstructive pulmonary disease) ([place])   ? Coronary artery disease   ? Dilated aortic root ([place])   ? Enlarged prostate   ? 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 GERD (gastroesophageal reflux disease)   ? Gout   ? Hiatal hernia   ? Hyperlipidemia   ? Hypertension   ? Intracranial hematoma ([place]) 1995  ? history of, s/p evacuation by Dr. Sherwood Gambler  ? Nocturia   ? Obesity   ? Pansinusitis   ? a.  complicated by brain abscess and bleeding requiring craniotomy in 1995.  ? PONV (postoperative nausea and vomiting)   ? Sinusitis   ? s/p ethmoidectomy and nasal septoplasty  ? Vertigo   ? intermitantly  ? ?Past Surgical History:  ?Past Surgical History:  ?Procedure Laterality Date  ? BIV UPGRADE N/A 01/29/2020  ? Procedure: BIV UPGRADE;  Surgeon: Evans Lance, MD;  Location: [place] [facility];  Service: Cardiovascular;  Laterality: N/A;  ? CIRCUMCISION N/A 11/20/2013  ? Procedure: CIRCUMCISION ADULT;  Surgeon: Claybon Jabs, MD;  Location: [facility];  Service: Urology;  Laterality: N/A;  ? CRANIOTOMY  1995  ? hematomy due to sinus infection   ? PACEMAKER IMPLANT N/A 01/17/2020  ? Procedure: PACEMAKER IMPLANT;  Surgeon: Deboraha Sprang, MD;  Location: [place] [facility];  Service: Cardiovascular;  Laterality: N/A;  ? RIGHT/LEFT  HEART CATH AND CORONARY ANGIOGRAPHY N/A 09/20/2019  ? Procedure: RIGHT/LEFT HEART CATH AND CORONARY ANGIOGRAPHY;  Surgeon: Jolaine Artist, MD;  Location: [place] [facility];  Service: Cardiovascular;  Laterality: N/A;  ? shoulder surg rt   1995  ? SINUS SURGERY WITH INSTATRAK    ? ethmoidectomy and nasal septum repair  ? ?HPI:  ?Ricky Hayden is a 76 yo gentleman w/ pertinent PMH of chronic HFrEF with ICD, a-fib, CKD stage IIIb, COPD who presented to [facility] on 5/2 w/ hematuria x 3 weeks. The week prior to admission he was started on Bactrim for urine cultures showing proteus mirabilis. He began having worsening fever and SOB and was brought to [facility] ED on 4/27.  At admission he was initially started on BiPAP for SOB. Fever 103 F. Started on empiric abx. CT abdomen showing nonobstructive nephrolithiasis. Urology consulted with no indication for procedure.  Further work up showing sepsis from UTI w/ pyelonephritis. Blood cultures were positive for proteus mirabilis. On Ancef with ID managing antibiotics.  On 5/2 he began having decreased LOC and increased O2 requirements. Intubated 5/3 with short PEA arrest after intubation. Extubated 5/12.  ?  ?Assessment / Plan / Recommendation  ?Clinical Impression ? Surprisingly, despite 10 day intubation, patient presents with what appears to be a functional oropharyngeal swallow. Oral phase prolonged with regular texture solids, however reported to  be not far from baseline per son. He was ultimately able to clear oral cavity with extra time and liquid wash. No overt s/s of aspiration with any consistency, even when challenged with thin liquids via multiple consecutive straw sips. Discussed with son and RN risk of aspiration, including silent aspiration, given time spent on the ventilator. Will continue dysphagia 3 solids, thin liquids and monitor closely. ?SLP Visit Diagnosis: Dysphagia, unspecified (R13.10) ?   ?   ?Diet Recommendation Dysphagia 3 (Mech soft);Thin liquid  ? ?Liquid  Administration via: Cup;Straw ?Medication Administration: Whole meds with puree ?Supervision: Patient able to self feed;Full supervision/cueing for compensatory strategies;Staff to assist with self feeding ?Compensations: Slow rate;Small sips/bites ?Postural Changes: Seated upright at 90 degrees  ?  ?Other  Recommendations Oral Care Recommendations: Oral care BID   ? ?Recommendations for follow up therapy are one component of a multi-disciplinary discharge planning process, led by the attending physician.  Recommendations may be updated based on patient status, additional functional criteria and insurance authorization. ? ?Follow up Recommendations No SLP follow up  ? ? ?  ?   ?   ?Frequency and Duration min 2x/week  ?1 week ?  ?   ? ?Prognosis Prognosis for Safe Diet Advancement: Good  ? ?  ? ?Swallow Study   ?General HPI: Ricky Hayden is a 76 yo gentleman w/ pertinent PMH of chronic HFrEF with ICD, a-fib, CKD stage IIIb, COPD who presented to [facility] on 5/2 w/ hematuria x 3 weeks. The week prior to admission he was started on Bactrim for urine cultures showing proteus mirabilis. He began having worsening fever and SOB and was brought to [facility] ED on 4/27.  At admission he was initially started on BiPAP for SOB. Fever 103 F. Started on empiric abx. CT abdomen showing nonobstructive nephrolithiasis. Urology consulted with no indication for procedure.  Further work up showing sepsis from UTI w/ pyelonephritis. Blood cultures were positive for proteus mirabilis. On Ancef with ID managing antibiotics.  On 5/2 he began having decreased LOC and increased O2 requirements. Intubated 5/3 with short PEA arrest after intubation. Extubated 5/12. ?Type of Study: Bedside Swallow Evaluation ?Previous Swallow Assessment: none ?Diet Prior to this Study: NPO ?Temperature Spikes Noted: No ?Respiratory Status: Nasal cannula (HFNC) ?History of Recent Intubation: Yes ?Length of Intubations (days): 10 days ?Date extubated:  09/12/21 ?Behavior/Cognition: Alert;Cooperative;Pleasant mood ?Oral Cavity Assessment: Within Functional Limits ?Oral Care Completed by SLP: Recent completion by staff ?Oral Cavity - Dentition: Adequate natural dentition;Missing dentition ?Vision: Functional for self-feeding ?Self-Feeding Abilities: Able to feed self;Needs assist ?Patient Positioning: Upright in bed (mild lean to the left) ?Baseline Vocal Quality: Hoarse (mild) ?Volitional Cough: Weak;Congested ?Volitional Swallow: Able to elicit  ?  ?Oral/Motor/Sensory Function Overall Oral Motor/Sensory Function: Generalized oral weakness   ?Ice Chips Ice chips: Not tested   ?Thin Liquid Thin Liquid: Within functional limits ?Presentation: Cup;Straw;Self Fed ([place] assist)  ?  ?Nectar Thick Nectar Thick Liquid: Not tested   ?Honey Thick Honey Thick Liquid: Not tested   ?Puree Puree: Within functional limits ?Presentation: Spoon   ?Solid ? ? ?  Solid: Impaired ?Oral Phase Functional Implications: Prolonged oral transit (per son, this is not far from baseline)  ? ?  ?Ashan Cueva MA, CCC-SLP ? ?Traylen Eckels Meryl ?09/13/2021,9:31 AM ? ? ? ?

## 2021-09-13 NOTE — Progress Notes (Signed)
Patient ID: Ricky Hayden, male   DOB: [birth year], 76 y.o.   MRN: [id number] ?[facility] ?Progress Note  ? ?Assessment/ Plan:   ?1.  Acute kidney injury on chronic kidney disease stage III:  ATN associated from severe urinary tract infection with sepsis/shock. On GI ppx.  ?- resolving AKI  ?- continue supportive care ?- ok to remove foley ? ?2.  Severe urinary tract infection with sepsis: With evidence of Proteus urinary tract infection as well as bacteremia. Abx per primary team and ID  ? ?3.  Acute hypoxic/hypercarbic respiratory failure: Suspected to have had an aspiration event; abx now off  ? ?4. CKD stage 3 ?- noted; pre-dates admission.  Baseline Cr about 1.5 from Jan 2023 ? ?5.  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  Looks like his regimen is torsemide 40 mg in the and and 20 mg in the evening per med hx ?- for now nephrology will give lasix 40 mg IV once ? ?6.  Hypernatremia: Secondary to insensible losses/decreased intake, improved a bit with increased free water.  free water at 250 ml every 4 hours to improve tolerance.  S/p D5W IV; improved.   ? ?7. HTN acceptable control. ? ?Disposition - per primary team.  Thank you for the consult.  Nephrology will sign off.  Please do not hesitate to reach out with any questions.  ? ?Subjective:   ? he had 2.6 liters UOP over 5/12.  He was extubated yesterday.  He doesn't know if on torsemide but this is on his medication list.   ? ?Review of systems: unable to obtain 2/2 intubation; denies air hunger   ? ?Objective:   ?BP 123/64   Pulse 62   Temp 98.1 ?F (36.7 ?C) (Oral)   Resp (!) 23   Ht 5\' 7"  (1.702 m)   Wt 103.9 kg   SpO2 93%   BMI 35.88 kg/m?  ? ?Intake/Output Summary (Last 24 hours) at 09/13/2021 0708 ?Last data filed at 09/13/2021 0600 ?Gross per 24 hour  ?Intake 3276.11 ml  ?Output 3075 ml  ?Net 201.11 ml  ? ?Weight change:  ? ?Physical  Exam:    ?Gen: elderly male  ?CVS: S1S2 ; no rub ?HEENT - NCAT  ?Neck trachea midline  ?Resp: clear but reduced to auscultation; on 3 liters oxygen ?Abd: Soft, obese, distended with obese habitus ?Ext: trace lower extremity edema ; 1+ edema bilateral hands ?Neuro -alert and oriented x 3 slow to answer some questions but correct ? ?Imaging: ?No results found. ? ?Labs: ?BMET ?Recent Labs  ?Lab 09/06/21 ?0800 09/07/21 ?0342 09/07/21 ?2150 09/08/21 ?0324 09/08/21 ?0830 09/09/21 ?0415 09/10/21 ?0310 09/11/21 ?0500 09/12/21 ?7353 09/13/21 ?2992  ?NA 149* 152* 145 147* 146* 144 145 148* 151* 146*  ?K 4.2 4.8 4.9 5.0 5.0 4.8 5.1 4.9 4.4 4.2  ?CL 117* 115* 111 110  --  106 109 112* 116* 114*  ?CO2 28 31 29 31   --  29 30 29 27 27   ?GLUCOSE 184* 168* 197* 165*  --  203* 177* 205* 232* 122*  ?BUN 88* 85* 85* 90*  --  98* 98* 106* 89* 69*  ?CREATININE 3.45* 3.39* 3.26* 3.45*  --  3.49* 3.34* 3.22* 2.05* 1.54*  ?CALCIUM 8.2* 8.8* 8.3* 8.7*  --  8.6* 8.6* 8.5* 9.1 8.8*  ?PHOS 3.2 3.9  --  4.3  --  4.7* 5.5* 5.8*  --   --   ? ?  CBC ?Recent Labs  ?Lab 09/10/21 ?0310 09/11/21 ?0500 09/12/21 ?6606 09/13/21 ?0115  ?WBC 9.8 8.8 6.8 8.5  ?HGB 8.9* 8.7* 8.4* 8.9*  ?HCT 28.2* 28.0* 26.9* 28.4*  ?MCV 97.6 98.2 98.5 97.9  ?PLT 152 161 154 214  ? ?Medications:   ? ? amiodarone  200 mg Oral Daily  ? apixaban  5 mg Oral BID  ? carvedilol  6.25 mg Oral BID WC  ? chlorhexidine  15 mL Mouth Rinse BID  ? Chlorhexidine Gluconate Cloth  6 each Topical Daily  ? feeding supplement (PROSource TF)  45 mL Per Tube BID  ? insulin aspart  0-20 Units Subcutaneous Q4H  ? insulin glargine-yfgn  15 Units Subcutaneous Daily  ? ipratropium-albuterol  3 mL Nebulization Q6H  ? mouth rinse  15 mL Mouth Rinse q12n4p  ? methylPREDNISolone (SOLU-MEDROL) injection  40 mg Intravenous Q24H  ? pantoprazole sodium  40 mg Oral Daily  ? PHENobarbital  64.8 mg Oral BID  ? pravastatin  40 mg Oral QPM  ? ?Claudia Desanctis, MD ?09/13/2021, 7:27 AM ? ? ? ? ? ?  ? ?

## 2021-09-14 DIAGNOSIS — J9601 Acute respiratory failure with hypoxia: Secondary | ICD-10-CM | POA: Diagnosis not present

## 2021-09-14 DIAGNOSIS — N1832 Chronic kidney disease, stage 3b: Secondary | ICD-10-CM

## 2021-09-14 DIAGNOSIS — A419 Sepsis, unspecified organism: Secondary | ICD-10-CM | POA: Diagnosis not present

## 2021-09-14 DIAGNOSIS — R6521 Severe sepsis with septic shock: Secondary | ICD-10-CM

## 2021-09-14 DIAGNOSIS — N179 Acute kidney failure, unspecified: Secondary | ICD-10-CM | POA: Diagnosis not present

## 2021-09-14 DIAGNOSIS — E1122 Type 2 diabetes mellitus with diabetic chronic kidney disease: Secondary | ICD-10-CM | POA: Diagnosis present

## 2021-09-14 DIAGNOSIS — R7881 Bacteremia: Secondary | ICD-10-CM | POA: Diagnosis not present

## 2021-09-14 LAB — BASIC METABOLIC PANEL
Anion gap: 8 (ref 5–15)
BUN: 53 mg/dL — ABNORMAL HIGH (ref 8–23)
CO2: 26 mmol/L (ref 22–32)
Calcium: 9 mg/dL (ref 8.9–10.3)
Chloride: 109 mmol/L (ref 98–111)
Creatinine, Ser: 1.33 mg/dL — ABNORMAL HIGH (ref 0.61–1.24)
GFR, Estimated: 55 mL/min — ABNORMAL LOW (ref >60)
Glucose, Bld: 99 mg/dL (ref 70–99)
Potassium: 4.1 mmol/L (ref 3.5–5.1)
Sodium: 143 mmol/L (ref 135–145)

## 2021-09-14 LAB — GLUCOSE, CAPILLARY
Glucose-Capillary: 107 mg/dL — ABNORMAL HIGH (ref 70–99)
Glucose-Capillary: 163 mg/dL — ABNORMAL HIGH (ref 70–99)
Glucose-Capillary: 218 mg/dL — ABNORMAL HIGH (ref 70–99)
Glucose-Capillary: 139 mg/dL — ABNORMAL HIGH (ref 70–99)

## 2021-09-14 LAB — CBC
HCT: 29.3 % — ABNORMAL LOW (ref 39.0–52.0)
Hemoglobin: 9.4 g/dL — ABNORMAL LOW (ref 13.0–17.0)
MCH: 30.1 pg (ref 26.0–34.0)
MCHC: 32.1 g/dL (ref 30.0–36.0)
MCV: 93.9 fL (ref 80.0–100.0)
Platelets: 253 10*3/uL (ref 150–400)
RBC: 3.12 MIL/uL — ABNORMAL LOW (ref 4.22–5.81)
RDW: 12.9 % (ref 11.5–15.5)
WBC: 8.3 10*3/uL (ref 4.0–10.5)
nRBC: 0 % (ref 0.0–0.2)

## 2021-09-14 MED ORDER — TORSEMIDE 20 MG PO TABS
20.0000 mg | ORAL_TABLET | Freq: Every day | ORAL | Status: DC
  Administered 2021-09-14: 20 mg via ORAL
  Filled 2021-09-14 (×2): qty 1

## 2021-09-14 NOTE — Progress Notes (Signed)
Inpatient Rehab Admissions Coordinator:  ? ?Per therapy recommendations, patient was screened for CIR candidacy by Clemens Catholic, MS, CCC-SLP. At this time, Pt. is not yet tolerating OOB and Not at a level where I belive he could tolerate the intensity of CIR.   Pt. may have potential to progress to becoming a potential CIR candidate, so CIR admissions team will follow and monitor for progress and participation with therapies and place consult order if Pt. appears to be an appropriate candidate. Please contact me with any questions.  ? ?Clemens Catholic, MS, CCC-SLP ?Rehab Admissions Coordinator  ?[phone] (celll) ?[phone] (office) ? ?

## 2021-09-14 NOTE — Progress Notes (Signed)
Speech Language Pathology Treatment: Dysphagia  ?Patient Details ?Name: Ricky Hayden ?MRN: [id number] ?DOB: Dec 27, 1945 ?Today's Date: 09/14/2021 ?Time: 5364-6803 ?SLP Time Calculation (min) (ACUTE ONLY): 12 min ? ?Assessment / Plan / Recommendation ?Clinical Impression ? F/u after swallow evaluation on previous date complete. Patient alert and able to participate although reported and appeared more fatigued than during session yesterday.  Hiccups throughout session after consuming am meal. SLP provided min verbal cueing for slow rate of intake during self feeding although even with multiple consecutive large sips, patient without indication of aspiration. Per RN, patient with some coughing during the day however not noted during po intake. Overall, patient appears to be tolerating current diet given bedside performance with intake and no change in vital signs. Will continue to f/u closely. ?  ?HPI HPI: [name] is a 76 yo gentleman w/ pertinent PMH of chronic HFrEF with ICD, a-fib, CKD stage IIIb, COPD who presented to [facility] on 5/2 w/ hematuria x 3 weeks. The week prior to admission he was started on Bactrim for urine cultures showing proteus mirabilis. He began having worsening fever and SOB and was brought to [facility] ED on 4/27.  At admission he was initially started on BiPAP for SOB. Fever 103 F. Started on empiric abx. CT abdomen showing nonobstructive nephrolithiasis. Urology consulted with no indication for procedure.  Further work up showing sepsis from UTI w/ pyelonephritis. Blood cultures were positive for proteus mirabilis. On Ancef with ID managing antibiotics.  On 5/2 he began having decreased LOC and increased O2 requirements. Intubated 5/3 with short PEA arrest after intubation. Extubated 5/12. ?  ?   ?SLP Plan ? Continue with current plan of care ? ?  ?  ?Recommendations for follow up therapy are one component of a multi-disciplinary discharge planning process, led by the attending physician.   Recommendations may be updated based on patient status, additional functional criteria and insurance authorization. ?  ? ?Recommendations  ?Diet recommendations: Thin liquid;Dysphagia 3 (mechanical soft) ?Liquids provided via: Cup;Straw ?Medication Administration: Whole meds with puree ?Supervision: Patient able to self feed;Full supervision/cueing for compensatory strategies (with meals) ?Compensations: Slow rate;Small sips/bites ?Postural Changes and/or Swallow Maneuvers: Seated upright 90 degrees  ?   ?    ?   ? ? ? ? Oral Care Recommendations: Oral care BID ?Follow Up Recommendations: No SLP follow up ?Assistance recommended at discharge: None ?SLP Visit Diagnosis: Dysphagia, unspecified (R13.10) ?Plan: Continue with current plan of care ? ? ? ? ?  ?  ?Delainie Chavana MA, CCC-SLP ? ? ?[name] ? ?09/14/2021, 8:42 AM ?

## 2021-09-14 NOTE — Progress Notes (Signed)
?PROGRESS NOTE ? ? ? ?Ricky Hayden  GQQ:[id number] DOB: January 04, 1946 DOA: 08/28/2021 ?PCP: [facility]  ? ? 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  He was initially started on BiPAP.  Started on empiric antibiotics.  CT scan abdomen pelvis showed nonobstructive nephrolithiasis.  Admitted to the medical floor treated for sepsis secondary to UTI with pyelonephritis.  Blood cultures were positive for Proteus mirabilis.  On 5/2 decreased level of consciousness and increased oxygen requirement, placed on BiPAP, became hypotensive and intubated for worsened mental status. ?5/12, extubated after diuresing. ?5/13, transferred to medical floor. ? ? ?Assessment & Plan: ?  ?Acute respiratory failure with hypoxemia, multifactorial.  Underlying history of COPD and chronic combined systolic and diastolic heart failure and fluid overload.  May have underlying sleep apnea. ?Currently extubated to nasal cannula oxygen. ?Remains on aggressive bronchodilator therapy, chest physiotherapy. ?Completed antibiotic treatment. ?On IV steroids.  We will taper off. ?Continue with steroid inhalers and nebulizers. ?Gently diuresed with improvement in volume status.  Was seen by nephrology. ?Patient currently not tolerating Entresto.  Will resume torsemide 20 mg daily.  Gradually reintroduce heart failure therapy. ? ?Septic shock: Secondary to Proteus mirabilis UTI and BPH.  Bacteremia cleared.  Completed 10 days of antibiotic therapy.  Tolerating finasteride for BPH.  Currently no evidence of retention. ? ?AKI on CKD stage IIIb: Improving off diuretics.  Creatinine at about baseline. ? ?Paroxysmal A-fib: In sinus rhythm.  On amiodarone.  Therapeutic on Eliquis. ? ?Type 2 diabetes: On  Jardiance at home.  Currently remains on insulin. ? ?Debility: PT OT.  Refer to inpatient rehab. ? ?Mobilize.  Transferred to progressive bed.  Discontinue rectal tube. ? ?DVT prophylaxis:  ?apixaban (ELIQUIS) tablet 5 mg  ? ?Code Status: Full code ?Family Communication: None ?Disposition Plan: Status is: Inpatient ?Remains inpatient appropriate because: Active treatment, IV steroids, breathing treatments.  Needs rehab. ?  ? ? ?Consultants:  ?Critical care ?Nephrology ? ?Procedures:  ?Intubation and extubation ? ?Antimicrobials:  ?Completed antibiotics ? ? ?Subjective: ?Patient seen and examined.  Denies any complaints.  Denies any shortness of breath at rest.  Not mobilized today.  Denies retention of urine. ? ?Objective: ?Vitals:  ? 09/14/21 0725 09/14/21 0759 09/14/21 0800 09/14/21 0900  ?BP:  (!) 142/62 (!) 142/62 123/68  ?Pulse:  76 75 60  ?Resp:   (!) 23 (!) 23  ?Temp: 98 ?F (36.7 ?C)     ?TempSrc: Oral     ?SpO2:   94% 95%  ?Weight:      ?Height:      ? ? ?Intake/Output Summary (Last 24 hours) at 09/14/2021 1033 ?Last data filed at 09/14/2021 0740 ?Gross per 24 hour  ?Intake 720 ml  ?Output 3375 ml  ?Net -2655 ml  ? ?Filed Weights  ? 09/10/21 0320 09/11/21 0500 09/12/21 0500  ?Weight: 106.2 kg 107.7 kg 103.9 kg  ? ? ?Examination: ? ?General exam: Appears calm and comfortable  ?Chronically sick looking.  Very frail and debilitated.  On 2 to 3 L of oxygen. ?Respiratory system: Some conducted upper airway sounds. ?Cardiovascular system: S1 & S2 heard, RRR.  Generalized anasarca.  ICD in place. ?Gastrointestinal system: Soft.  Obese and pendulous.  Rectal tube with brown stool. ?Central nervous system: Alert and oriented. No focal neurological deficits. ?  Extremities: Symmetric 5 x 5 power.  Generalized weakness. ?Skin: No rashes, lesions or ulcers ?Psychiatry: Judgement and insight appear normal. Mood & affect appropriate.  ? ? ? ?Data Reviewed: I have personally reviewed following labs and imaging  studies ? ?CBC: ?Recent Labs  ?Lab 09/10/21 ?0310 09/11/21 ?0500 09/12/21 ?6606 09/13/21 ?0115 09/14/21 ?0251  ?WBC 9.8 8.8 6.8 8.5 8.3  ?HGB 8.9* 8.7* 8.4* 8.9* 9.4*  ?HCT 28.2* 28.0* 26.9* 28.4* 29.3*  ?MCV 97.6 98.2 98.5 97.9 93.9  ?PLT 152 161 154 214 253  ? ?Basic Metabolic Panel: ?Recent Labs  ?Lab 09/08/21 ?0324 09/08/21 ?0830 09/09/21 ?0415 09/10/21 ?0310 09/11/21 ?0500 09/12/21 ?3016 09/13/21 ?0109 09/14/21 ?0251  ?NA 147*   < > 144 145 148* 151* 146* 143  ?K 5.0   < > 4.8 5.1 4.9 4.4 4.2 4.1  ?CL 110  --  106 109 112* 116* 114* 109  ?CO2 31  --  29 30 29 27 27 26   ?GLUCOSE 165*  --  203* 177* 205* 232* 122* 99  ?BUN 90*  --  98* 98* 106* 89* 69* 53*  ?CREATININE 3.45*  --  3.49* 3.34* 3.22* 2.05* 1.54* 1.33*  ?CALCIUM 8.7*  --  8.6* 8.6* 8.5* 9.1 8.8* 9.0  ?MG  --   --   --  2.9*  --  3.0*  --   --   ?PHOS 4.3  --  4.7* 5.5* 5.8*  --   --   --   ? < > = values in this interval not displayed.  ? ?GFR: ?Estimated Creatinine Clearance: 53.4 mL/min (A) (by C-G formula based on SCr of 1.33 mg/dL (H)). ?Liver Function Tests: ?Recent Labs  ?Lab 09/09/21 ?0415 09/10/21 ?0310 09/11/21 ?0500 09/12/21 ?3235 09/13/21 ?5732  ?AST  --   --   --  90* 104*  ?ALT  --   --   --  128* 128*  ?ALKPHOS  --   --   --  135* 105  ?BILITOT  --   --   --  0.6 0.7  ?PROT  --   --   --  7.1 6.6  ?ALBUMIN 1.9* 1.8* 1.8* 2.0* 1.9*  ? ?No results for input(s): LIPASE, AMYLASE in the last 168 hours. ?No results for input(s): AMMONIA in the last 168 hours. ?Coagulation Profile: ?No results for input(s): INR, PROTIME in the last 168 hours. ?Cardiac Enzymes: ?No results for input(s): CKTOTAL, CKMB, CKMBINDEX, TROPONINI in the last 168 hours. ?BNP (last 3 results) ?No results for input(s): PROBNP in the last 8760 hours. ?HbA1C: ?No results for input(s): HGBA1C in the last 72 hours. ?CBG: ?Recent Labs  ?Lab 09/13/21 ?0724 09/13/21 ?1131 09/13/21 ?1808 09/13/21 ?2201 09/14/21 ?0725  ?GLUCAP 98 152* 151* 124* 107*  ? ?Lipid Profile: ?No results  for input(s): CHOL, HDL, LDLCALC, TRIG, CHOLHDL, LDLDIRECT in the last 72 hours. ?Thyroid Function Tests: ?No results for input(s): TSH, T4TOTAL, FREET4, T3FREE, THYROIDAB in the last 72 hours. ?Anemia Panel: ?No results for input(s): VITAMINB12, FOLATE, FERRITIN, TIBC, IRON, RETICCTPCT in the last 72 hours. ?Sepsis Labs: ?No results for input(s): PROCALCITON, LATICACIDVEN in the last 168 hours. ? ?Recent Results (from the past 240 hour(s))  ?Culture, Respiratory w Gram Stain     Status: None  ? Collection Time: 09/04/21 11:16 AM  ? Specimen: Tracheal Aspirate; Respiratory  ?Result Value Ref Range Status  ? Specimen Description TRACHEAL ASPIRATE  Final  ? Special Requests NONE  Final  ? Gram Stain   Final  ?  RARE WBC PRESENT, PREDOMINANTLY PMN ?RARE GRAM POSITIVE COCCI IN PAIRS ?RARE YEAST WITH PSEUDOHYPHAE ?Performed at [facility], [place] [address], [place], [place] [address] ?  ? Culture FEW CANDIDA ALBICANS  Final  ? Report Status 09/07/2021 FINAL  Final  ?Culture, blood (Routine X 2) w Reflex to ID Panel     Status: None  ? Collection Time: 09/08/21  4:53 PM  ? Specimen: BLOOD  ?Result Value Ref Range Status  ? Specimen Description BLOOD RIGHT ANTECUBITAL  Final  ? Special Requests   Final  ?  BOTTLES DRAWN AEROBIC AND ANAEROBIC Blood Culture results may not be optimal due to an inadequate volume of blood received in culture bottles  ? Culture   Final  ?  NO GROWTH 5 DAYS ?Performed at [facility], [place] [address], [place], [place] [address] ?  ? Report Status 09/13/2021 FINAL  Final  ?Culture, blood (Routine X 2) w Reflex to ID Panel     Status: None  ? Collection Time: 09/08/21  4:53 PM  ? Specimen: BLOOD  ?Result Value Ref Range Status  ? Specimen Description BLOOD RIGHT ANTECUBITAL  Final  ? Special Requests IN PEDIATRIC BOTTLE Blood Culture adequate volume  Final  ? Culture   Final  ?  NO GROWTH 5 DAYS ?Performed at [facility], [place] [address], [place], [place] [address] ?  ? Report  Status 09/13/2021 FINAL  Final  ?Culture, Respiratory w Gram Stain     Status: None  ? Collection Time: 09/09/21 10:08 AM  ? Specimen: Tracheal Aspirate; Respiratory  ?Result Value Ref Range Status  ? Specimen Description TRACHE

## 2021-09-15 DIAGNOSIS — N179 Acute kidney failure, unspecified: Secondary | ICD-10-CM | POA: Diagnosis not present

## 2021-09-15 DIAGNOSIS — A419 Sepsis, unspecified organism: Secondary | ICD-10-CM | POA: Diagnosis not present

## 2021-09-15 DIAGNOSIS — R7881 Bacteremia: Secondary | ICD-10-CM | POA: Diagnosis not present

## 2021-09-15 DIAGNOSIS — J9601 Acute respiratory failure with hypoxia: Secondary | ICD-10-CM | POA: Diagnosis not present

## 2021-09-15 LAB — CBC
HCT: 30 % — ABNORMAL LOW (ref 39.0–52.0)
Hemoglobin: 9.5 g/dL — ABNORMAL LOW (ref 13.0–17.0)
MCH: 29.9 pg (ref 26.0–34.0)
MCHC: 31.7 g/dL (ref 30.0–36.0)
MCV: 94.3 fL (ref 80.0–100.0)
Platelets: 257 10*3/uL (ref 150–400)
RBC: 3.18 MIL/uL — ABNORMAL LOW (ref 4.22–5.81)
RDW: 12.7 % (ref 11.5–15.5)
WBC: 9.1 10*3/uL (ref 4.0–10.5)
nRBC: 0 % (ref 0.0–0.2)

## 2021-09-15 LAB — GLUCOSE, CAPILLARY
Glucose-Capillary: 100 mg/dL — ABNORMAL HIGH (ref 70–99)
Glucose-Capillary: 124 mg/dL — ABNORMAL HIGH (ref 70–99)
Glucose-Capillary: 161 mg/dL — ABNORMAL HIGH (ref 70–99)

## 2021-09-15 LAB — BASIC METABOLIC PANEL
Anion gap: 7 (ref 5–15)
BUN: 47 mg/dL — ABNORMAL HIGH (ref 8–23)
CO2: 25 mmol/L (ref 22–32)
Calcium: 9 mg/dL (ref 8.9–10.3)
Chloride: 111 mmol/L (ref 98–111)
Creatinine, Ser: 1.21 mg/dL (ref 0.61–1.24)
GFR, Estimated: >60 mL/min (ref >60)
Glucose, Bld: 102 mg/dL — ABNORMAL HIGH (ref 70–99)
Potassium: 4.4 mmol/L (ref 3.5–5.1)
Sodium: 143 mmol/L (ref 135–145)

## 2021-09-15 LAB — PHOSPHORUS: Phosphorus: 4.1 mg/dL (ref 2.5–4.6)

## 2021-09-15 LAB — MAGNESIUM: Magnesium: 2 mg/dL (ref 1.7–2.4)

## 2021-09-15 MED ORDER — SACUBITRIL-VALSARTAN 24-26 MG PO TABS
1.0000 | ORAL_TABLET | Freq: Two times a day (BID) | ORAL | Status: DC
  Administered 2021-09-15 – 2021-09-19 (×9): 1 via ORAL
  Filled 2021-09-15 (×11): qty 1

## 2021-09-15 MED ORDER — IPRATROPIUM-ALBUTEROL 0.5-2.5 (3) MG/3ML IN SOLN
3.0000 mL | Freq: Two times a day (BID) | RESPIRATORY_TRACT | Status: DC
  Administered 2021-09-16 – 2021-09-17 (×3): 3 mL via RESPIRATORY_TRACT
  Filled 2021-09-15 (×4): qty 3

## 2021-09-15 MED ORDER — TORSEMIDE 20 MG PO TABS
20.0000 mg | ORAL_TABLET | Freq: Two times a day (BID) | ORAL | Status: DC
  Administered 2021-09-15 – 2021-09-19 (×9): 20 mg via ORAL
  Filled 2021-09-15 (×10): qty 1

## 2021-09-15 NOTE — Progress Notes (Signed)
Pt transferred to [facility] room 17, stable following commands,  all belongings at bedside.  ?

## 2021-09-15 NOTE — Progress Notes (Signed)
Id brief note ? ?No fever last 2-3 days off abx. Extubated doing much better ? ?Suspect aspiration pneumonitis while intubated previously ?Finished tx for gnr bacteremia ? ? ?Id will sign off ?

## 2021-09-15 NOTE — TOC Progression Note (Addendum)
Transition of Care (TOC) - Initial/Assessment Note  ? ? ?Patient Details  ?Name: Ricky Hayden ?MRN: [id number] ?Date of Birth: 1946/01/26 ? ?Transition of Care (TOC) CM/SW Contact:    ?Paulene Floor Amey Hossain, LCSWA ?Phone Number: ?09/15/2021, 1:37 PM ? ?Clinical Narrative:                 ?CSW received message from MD stating that the patient may need SNF placement.  CSW contacted the patient's daughter Vashon Riordan.  The daughter reports that the family is unable to meet the patient's needs 24/7 at the home and wanted to know what assistance the [place] could provide in reference to home health.  If home health could not be provided for extended hours the family is open to SNF and would like a list of SNF facilities that contract with the [place]. ? ?CSW contacted patient's [facility] SW, Cyndee Brightly, [phone] and [phone] ext [phone], to discuss discharge planning and left a secure VM.  CSW is awaiting a returned call ? ?1500-  CSW received a returned call from [place] with the [place].  The [facility] can provide up to 20 hours of personal care assistance of the patient qualifies, but not 24/7 coverage.  CSW completed the community living center screening and will fax to the [place] to request approval for SNF placement for rehab.   ? ? ?  ?  ? ? ?Patient Goals and CMS Choice ?  ?  ?  ? ?Expected Discharge Plan and Services ?  ?  ?  ?  ?  ?                ?  ?  ?  ?  ?  ?  ?  ?  ?  ?  ? ?Prior Living Arrangements/Services ?  ?  ?  ?       ?  ?  ?  ?  ? ?Activities of Daily Living ?Home Assistive Devices/Equipment: None ?ADL Screening (condition at time of admission) ?Patient's cognitive ability adequate to safely complete daily activities?: Yes ?Is the patient deaf or have difficulty hearing?: No ?Does the patient have difficulty seeing, even when wearing glasses/contacts?: No ?Does the patient have difficulty concentrating, remembering, or making decisions?: No ?Patient able to express need for assistance with ADLs?: Yes ?Does  the patient have difficulty dressing or bathing?: No ?Independently performs ADLs?: Yes (appropriate for developmental age) ?Does the patient have difficulty walking or climbing stairs?: No ?Weakness of Legs: None ?Weakness of Arms/Hands: None ? ?Permission Sought/Granted ?  ?  ?   ?   ?   ?   ? ?Emotional Assessment ?  ?  ?  ?  ?  ?  ? ?Admission diagnosis:  Encephalopathy [G93.40] ?Acute respiratory failure with hypoxia ([place]) [J96.01] ?Sepsis ([place]) [A41.9] ?Urinary tract infection with hematuria, site unspecified [N39.0, R31.9] ?Patient Active Problem List  ? Diagnosis Date Noted  ? Type 2 diabetes mellitus with chronic kidney disease, without long-term current use of insulin ([place]) 09/14/2021  ? Bacteremia due to Gram-negative bacteria   ? Complicated UTI (urinary tract infection)   ? Acute respiratory failure with hypoxia ([place])   ? Septic shock ([place])   ? Proteus (mirabilis) ([place]) as the cause of diseases classified elsewhere 09/02/2021  ? ARF (acute renal failure) ([place]) 08/29/2021  ? Presence of biventricular cardiac pacemaker 05/15/2020  ? PVC (premature ventricular contraction) 12/11/2019  ? NICM (nonischemic cardiomyopathy) ([place]) 12/11/2019  ? CKD (chronic kidney disease) stage 3,  GFR 30-59 ml/min ([place]) 08/12/2019  ? Hyperkalemia   ? Chronic combined systolic and diastolic CHF, NYHA class 2 ([place]) 06/10/2015  ? Leg swelling 07/16/2014  ? Preventative health care 03/07/2014  ? COPD (chronic obstructive pulmonary disease) ([place]) 09/11/2013  ? BPPV (benign paroxysmal positional vertigo) 10/05/2012  ? BPH (benign prostatic hyperplasia) 01/10/2008  ? Gout 04/22/2006  ? ERECTILE DYSFUNCTION 04/22/2006  ? 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 Hyperlipidemia 03/15/2006  ? Essential hypertension 03/15/2006  ? GERD 03/15/2006  ? HIATAL HERNIA 03/15/2006  ? OSTEOARTHRITIS 03/15/2006  ? 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 ?PCP:  [facility] ?Pharmacy:   ?[facility], [address] ?[phone] ?[place] [address] ?Phone: [phone] Fax: [phone] ? ?[facility] ?[address] ?[place] [address] ?Phone: [phone] Fax: [phone] ? ? ? ? ?Social Determinants of Health (SDOH) Interventions ?  ? ?Readmission Risk Interventions ? ?  09/08/2021  ?  3:40 PM 08/18/2019  ? 11:08 AM 08/17/2019  ?  3:41 PM  ?Readmission Risk Prevention Plan  ?Transportation Screening Complete Complete Complete  ?PCP or Specialist Appt within 5-7 Days   Complete  ?PCP or Specialist Appt within 3-5 Days  Complete   ?Home Care Screening   Complete  ?Medication Review (RN CM)   Complete  ?[place] or Home Care Consult  Complete   ?Social Work Consult for [place] Planning/Counseling  Complete   ?Palliative Care Screening  Complete   ?Medication Review (RN Care Manager) Referral to Pharmacy Complete   ?PCP or Specialist appointment within 3-5 days of discharge Not Complete    ?PCP/Specialist Appt Not Complete comments Patient currently in ICU    ?[place] or Home Care Consult Not Complete    ?[place] or Home Care Consult Pt Refusal Comments CIR recommended at this time    ?SW Recovery Care/Counseling Consult Complete    ?Palliative Care Screening Not Applicable    ?[place] Not Applicable    ? ? ? ?

## 2021-09-15 NOTE — [place] FL2 (Signed)
? MEDICAID FL2 LEVEL OF CARE SCREENING TOOL  ?  ? ?IDENTIFICATION  ?Patient Name: ?Ricky Hayden Birthdate: February 27, 1946 Sex: male Admission Date (Current Location): ?08/28/2021  ?[place] and [place] Number: ? [place] ?  Facility and Address:  ?[facility], [place] [address], [place], [place] [address] ?     Provider Number: ?[id number]  ?Attending Physician Name and Address:  ?Barb Merino, MD ? Relative Name and Phone Number:  ?[place], Bunton (Daughter)   [phone] ?   ?Current Level of Care: ?Hospital Recommended Level of Care: ?[place] Prior Approval Number: ?  ? ?Date Approved/Denied: ?  PASRR Number: ?  ? ?Discharge Plan: ?SNF ?  ? ?Current Diagnoses: ?Patient Active Problem List  ? Diagnosis Date Noted  ? Type 2 diabetes mellitus with chronic kidney disease, without long-term current use of insulin ([place]) 09/14/2021  ? Bacteremia due to Gram-negative bacteria   ? Complicated UTI (urinary tract infection)   ? Acute respiratory failure with hypoxia ([place])   ? Septic shock ([place])   ? Proteus (mirabilis) (m[place]) as the cause of diseases classified elsewhere 09/02/2021  ? ARF (acute renal failure) ([place]) 08/29/2021  ? Presence of biventricular cardiac pacemaker 05/15/2020  ? PVC (premature ventricular contraction) 12/11/2019  ? NICM (nonischemic cardiomyopathy) ([place]) 12/11/2019  ? CKD (chronic kidney disease) stage 3, GFR 30-59 ml/min (HCC) 08/12/2019  ? Hyperkalemia   ? Chronic combined systolic and diastolic CHF, NYHA class 2 ([place]) 06/10/2015  ? Leg swelling 07/16/2014  ? Preventative health care 03/07/2014  ? COPD (chronic obstructive pulmonary disease) ([place]) 09/11/2013  ? BPPV (benign paroxysmal positional vertigo) 10/05/2012  ? BPH (benign prostatic hyperplasia) 01/10/2008  ? Gout 04/22/2006  ? ERECTILE DYSFUNCTION 04/22/2006  ? 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 Hyperlipidemia 03/15/2006  ? Essential hypertension 03/15/2006  ? GERD 03/15/2006  ? HIATAL HERNIA  03/15/2006  ? OSTEOARTHRITIS 03/15/2006  ? 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 ? ?Orientation RESPIRATION BLADDER Height & Weight   ?  ?Self, Time, Situation ? O2 (2L) Continent Weight: 229 lb 0.9 oz (103.9 kg) ?Height:  5\' 7"  (170.2 cm)  ?BEHAVIORAL SYMPTOMS/MOOD NEUROLOGICAL BOWEL NUTRITION STATUS  ?    Continent, Incontinent Diet (see d/ summary)  ?AMBULATORY STATUS COMMUNICATION OF NEEDS Skin   ?Total Care Verbally PU Stage and Appropriate Care, Skin abrasions (PU- buttocks, skin tears) ?  ?  ?  ?    ?     ?     ? ? ?Personal Care Assistance Level of Assistance  ?Bathing, Dressing, Feeding Bathing Assistance: Limited assistance ?Feeding assistance: Limited assistance ?Dressing Assistance: Limited assistance ?   ? ?Functional Limitations Info  ?Sight, Hearing, Speech Sight Info: Adequate ?Hearing Info: Adequate ?Speech Info: Adequate  ? ? ?SPECIAL CARE FACTORS FREQUENCY  ?PT (By licensed PT), OT (By licensed OT)   ?  ?PT Frequency: 5x/ week ?OT Frequency: 5x/ week ?  ?  ?  ?   ? ? ?Contractures    ? ? ?Additional Factors Info  ?Code Status, Insulin Sliding Scale, Allergies Code Status Info: Full ?Allergies Info: Calcium Channel Blockers ?  ?Insulin Sliding Scale Info: see d/c summary ?  ?   ? ?Current Medications (09/15/2021):  This is the current hospital active medication list ?Current Facility-Administered Medications  ?Medication Dose Route Frequency Provider Last Rate Last Admin  ? 0.9 %  sodium chloride infusion  250 mL Intravenous Continuous Kipp Brood, MD   Stopped at 09/11/21 0110  ? acetaminophen (TYLENOL)  tablet 650 mg  650 mg Oral Q6H PRN Kipp Brood, MD      ? Or  ? acetaminophen (TYLENOL) suppository 650 mg  650 mg Rectal Q6H PRN Kipp Brood, MD      ? albuterol (PROVENTIL) (2.5 MG/3ML) 0.083% nebulizer solution 2.5 mg  2.5 mg Nebulization Q2H PRN Agarwala, Einar Grad, MD      ? amiodarone (PACERONE) tablet 200 mg  200 mg Oral Daily Agarwala, Einar Grad, MD   200 mg at 09/15/21 9678  ? apixaban  (ELIQUIS) tablet 5 mg  5 mg Oral BID Kipp Brood, MD   5 mg at 09/15/21 9381  ? carvedilol (COREG) tablet 6.25 mg  6.25 mg Oral BID WC Kipp Brood, MD   6.25 mg at 09/15/21 0754  ? Chlorhexidine Gluconate Cloth 2 % PADS 6 each  6 each Topical Daily Kipp Brood, MD   6 each at 09/15/21 1015  ? insulin aspart (novoLOG) injection 0-20 Units  0-20 Units Subcutaneous TID WC Kipp Brood, MD   3 Units at 09/15/21 1215  ? insulin glargine-yfgn (SEMGLEE) injection 15 Units  15 Units Subcutaneous Daily Kipp Brood, MD   15 Units at 09/15/21 0925  ? ipratropium-albuterol (DUONEB) 0.5-2.5 (3) MG/3ML nebulizer solution 3 mL  3 mL Nebulization Q6H Agarwala, Ravi, MD   3 mL at 09/15/21 1538  ? melatonin tablet 3 mg  3 mg Oral QHS Laqueta Jean, MD   3 mg at 09/14/21 2122  ? metoprolol tartrate (LOPRESSOR) injection 2.5 mg  2.5 mg Intravenous Q6H PRN Kipp Brood, MD      ? pantoprazole (PROTONIX) EC tablet 40 mg  40 mg Oral Daily Kipp Brood, MD   40 mg at 09/15/21 0924  ? PHENobarbital (LUMINAL) tablet 64.8 mg  64.8 mg Oral BID Kipp Brood, MD   64.8 mg at 09/15/21 0924  ? pravastatin (PRAVACHOL) tablet 40 mg  40 mg Oral QPM Agarwala, Einar Grad, MD   40 mg at 09/14/21 1737  ? sacubitril-valsartan (ENTRESTO) 24-26 mg per tablet  1 tablet Oral BID Barb Merino, MD   1 tablet at 09/15/21 1218  ? senna (SENOKOT) tablet 8.6 mg  1 tablet Oral QHS PRN Kipp Brood, MD      ? torsemide (DEMADEX) tablet 20 mg  20 mg Oral BID Barb Merino, MD   20 mg at 09/15/21 0753  ? white petrolatum (VASELINE) gel   Topical PRN Kipp Brood, MD      ? ? ? ?Discharge Medications: ?Please see discharge summary for a list of discharge medications. ? ?Relevant Imaging Results: ? ?Relevant Lab Results: ? ? ?Additional Information ?SSn: [phone]      [facility];  5'7" 229lbs ? ?Paulene Floor Joyice Magda, LCSWA ? ? ? ? ?

## 2021-09-15 NOTE — Progress Notes (Signed)
?PROGRESS NOTE ? ? ? ?Ricky Hayden  OFB:[id number] DOB: [birth year] DOA: 08/28/2021 ?PCP: [facility]  ? ? 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  He was initially started on BiPAP.  Started on empiric antibiotics.  CT scan abdomen pelvis showed nonobstructive nephrolithiasis.  Admitted to the medical floor treated for sepsis secondary to UTI with pyelonephritis.  Blood cultures were positive for Proteus mirabilis.  On 5/2 decreased level of consciousness and increased oxygen requirement, placed on BiPAP, became hypotensive and intubated for worsened mental status. ?5/12, extubated after diuresing. ?5/13, transferred to medical floor. ? ? ?Assessment & Plan: ?  ?Acute respiratory failure with hypoxemia, multifactorial.  Underlying history of COPD and chronic combined systolic and diastolic heart failure and fluid overload.  May have underlying sleep apnea. ?Currently extubated to nasal cannula oxygen. ?Remains on aggressive bronchodilator therapy, chest physiotherapy. ?Completed antibiotic treatment. ?On IV steroids.  We will taper off. ?Continue with steroid inhalers and nebulizers. ?Gently diuresed with improvement in volume status.  Was seen by nephrology. ?Blood pressures are adequate.  Torsemide 20 mg twice daily.  Will start Entresto today.  Gradually reintroduce heart failure therapy. ? ?Septic shock: Secondary to Proteus mirabilis UTI and BPH.  Bacteremia cleared.  Completed 10 days of antibiotic therapy.  Tolerating finasteride for BPH.  Currently no evidence of retention.  We will check postvoid residual. ? ?AKI on CKD stage IIIb: Improving off diuretics.  Creatinine at about baseline. ? ?Paroxysmal A-fib: In  sinus rhythm.  On amiodarone.  Therapeutic on Eliquis. ?There is potential interaction with Eliquis and phenobarbital, however there is no safe alternative.  Other anticoagulant will have interaction with amiodarone. ?Patient is tolerating phenobarbital for last 27 years, will continue.   ? ?Type 2 diabetes: On Jardiance at home.  Currently remains on insulin. ? 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  We will continue. ? ?Deconditioning, debility: PT OT.  Refer to SNF. ? ?Suspected sleep apnea: Patient would never go on CPAP or BiPAP, keep on nocturnal oxygen. ? ? ? ?DVT prophylaxis:  ?apixaban (ELIQUIS) tablet 5 mg  ? ?Code Status: Full code ?Family Communication: Daughter on the phone. ?Disposition Plan: Status is: Inpatient ?Remains inpatient appropriate because: Active treatment, IV steroids, breathing treatments.  Needs rehab. ?  ? ? ?Consultants:  ?Critical care ?Nephrology ? ?Procedures:  ?Intubation and extubation ? ?Antimicrobials:  ?Completed antibiotics ? ? ?Subjective: ? ?Patient seen and examined.  Exhausted even on moving around in the bed by the nursing staff.  Denies any other complaints.  Tired but no other problems. ? ?Objective: ?Vitals:  ? 09/15/21 0900 09/15/21 1000 09/15/21 1100 09/15/21 1200  ?BP:  134/71 (!) 142/71 138/69  ?Pulse: 79 72 75 72  ?Resp:    (!) 27  ?Temp:    98.1 ?F (36.7 ?C)  ?TempSrc:    Oral  ?SpO2:    95%  ?Weight:      ?Height:      ? ? ?Intake/Output Summary (Last 24 hours) at 09/15/2021 1301 ?Last data filed at 09/15/2021 1220 ?Gross per 24 hour  ?Intake 540 ml  ?Output 2775 ml  ?Net -2235 ml  ? ? ?Filed Weights  ? 09/10/21 0320 09/11/21 0500 09/12/21  0500  ?Weight: 106.2 kg 107.7 kg 103.9 kg  ? ? ?Examination: ? ?General exam: Appears calm and comfortable .  Frail and debilitated.  Slightly anxious.  On 2 L oxygen. ?Chronically sick looking.   ?Respiratory system: Some conducted upper airway sounds. ?Cardiovascular system: S1 &  S2 heard, RRR.  Generalized anasarca.  ICD in place. ?Gastrointestinal system: Soft.  Obese and pendulous.   ?Central nervous system: Alert and oriented. No focal neurological deficits. ?Extremities: Symmetric 5 x 5 power.  Generalized weakness. ?Skin: No rashes, lesions or ulcers ?Psychiatry: Judgement and insight appear normal.  Anxious mood and flat affect. ? ? ? ?Data Reviewed: I have personally reviewed following labs and imaging studies ? ?CBC: ?Recent Labs  ?Lab 09/11/21 ?0500 09/12/21 ?4174 09/13/21 ?0115 09/14/21 ?0251 09/15/21 ?0146  ?WBC 8.8 6.8 8.5 8.3 9.1  ?HGB 8.7* 8.4* 8.9* 9.4* 9.5*  ?HCT 28.0* 26.9* 28.4* 29.3* 30.0*  ?MCV 98.2 98.5 97.9 93.9 94.3  ?PLT 161 154 214 253 257  ? ? ?Basic Metabolic Panel: ?Recent Labs  ?Lab 09/09/21 ?0415 09/10/21 ?0310 09/11/21 ?0500 09/12/21 ?0814 09/13/21 ?4818 09/14/21 ?5631 09/15/21 ?0146  ?NA 144 145 148* 151* 146* 143 143  ?K 4.8 5.1 4.9 4.4 4.2 4.1 4.4  ?CL 106 109 112* 116* 114* 109 111  ?CO2 29 30 29 27 27 26 25   ?GLUCOSE 203* 177* 205* 232* 122* 99 102*  ?BUN 98* 98* 106* 89* 69* 53* 47*  ?CREATININE 3.49* 3.34* 3.22* 2.05* 1.54* 1.33* 1.21  ?CALCIUM 8.6* 8.6* 8.5* 9.1 8.8* 9.0 9.0  ?MG  --  2.9*  --  3.0*  --   --  2.0  ?PHOS 4.7* 5.5* 5.8*  --   --   --  4.1  ? ? ?GFR: ?Estimated Creatinine Clearance: 58.7 mL/min (by C-G formula based on SCr of 1.21 mg/dL). ?Liver Function Tests: ?Recent Labs  ?Lab 09/09/21 ?0415 09/10/21 ?0310 09/11/21 ?0500 09/12/21 ?4970 09/13/21 ?2637  ?AST  --   --   --  90* 104*  ?ALT  --   --   --  128* 128*  ?ALKPHOS  --   --   --  135* 105  ?BILITOT  --   --   --  0.6 0.7  ?PROT  --   --   --  7.1 6.6  ?ALBUMIN 1.9* 1.8* 1.8* 2.0* 1.9*  ? ? ?No results for input(s): LIPASE, AMYLASE in the last 168 hours. ?No results for input(s): AMMONIA in the last 168 hours. ?Coagulation Profile: ?No results for input(s): INR, PROTIME in the last 168 hours. ?Cardiac Enzymes: ?No results for input(s): CKTOTAL, CKMB, CKMBINDEX, TROPONINI in the  last 168 hours. ?BNP (last 3 results) ?No results for input(s): PROBNP in the last 8760 hours. ?HbA1C: ?No results for input(s): HGBA1C in the last 72 hours. ?CBG: ?Recent Labs  ?Lab 09/14/21 ?1142 09/14/21 ?1653 09/14/21 ?2130 09/15/21 ?0753 09/15/21 ?1151  ?GLUCAP 163* 218* 139* 100* 124*  ? ? ?Lipid Profile: ?No results for input(s): CHOL, HDL, LDLCALC, TRIG, CHOLHDL, LDLDIRECT in the last 72 hours. ?Thyroid Function Tests: ?No results for input(s): TSH, T4TOTAL, FREET4, T3FREE, THYROIDAB in the last 72 hours. ?Anemia Panel: ?No results for input(s): VITAMINB12, FOLATE, FERRITIN, TIBC, IRON, RETICCTPCT in the last 72 hours. ?Sepsis Labs: ?No results for input(s): PROCALCITON, LATICACIDVEN in the last 168 hours. ? ?Recent Results (from the past 240 hour(s))  ?Culture, blood (Routine X 2) w Reflex to ID Panel     Status: None  ?  Collection Time: 09/08/21  4:53 PM  ? Specimen: BLOOD  ?Result Value Ref Range Status  ? Specimen Description BLOOD RIGHT ANTECUBITAL  Final  ? Special Requests   Final  ?  BOTTLES DRAWN AEROBIC AND ANAEROBIC Blood Culture results may not be optimal due to an inadequate volume of blood received in culture bottles  ? Culture   Final  ?  NO GROWTH 5 DAYS ?Performed at [facility], [place] [address], [place], [place] [address] ?  ? Report Status 09/13/2021 FINAL  Final  ?Culture, blood (Routine X 2) w Reflex to ID Panel     Status: None  ? Collection Time: 09/08/21  4:53 PM  ? Specimen: BLOOD  ?Result Value Ref Range Status  ? Specimen Description BLOOD RIGHT ANTECUBITAL  Final  ? Special Requests IN PEDIATRIC BOTTLE Blood Culture adequate volume  Final  ? Culture   Final  ?  NO GROWTH 5 DAYS ?Performed at [facility], [place] [address], [place],  [address] ?  ? Report Status 09/13/2021 FINAL  Final  ?Culture, Respiratory w Gram Stain     Status: None  ? Collection Time: 09/09/21 10:08 AM  ? Specimen: Tracheal Aspirate; Respiratory  ?Result Value Ref Range Status  ? Specimen  Description TRACHEAL ASPIRATE  Final  ? Special Requests NONE  Final  ? Gram Stain   Final  ?  ABUNDANT WBC PRESENT, PREDOMINANTLY PMN ?FEW BUDDING YEAST SEEN ?Performed at [facility], [place]

## 2021-09-16 ENCOUNTER — Inpatient Hospital Stay (HOSPITAL_COMMUNITY): Payer: No Typology Code available for payment source

## 2021-09-16 DIAGNOSIS — J9601 Acute respiratory failure with hypoxia: Secondary | ICD-10-CM | POA: Diagnosis not present

## 2021-09-16 DIAGNOSIS — Z7189 Other specified counseling: Secondary | ICD-10-CM

## 2021-09-16 DIAGNOSIS — R7881 Bacteremia: Secondary | ICD-10-CM | POA: Diagnosis not present

## 2021-09-16 DIAGNOSIS — N179 Acute kidney failure, unspecified: Secondary | ICD-10-CM | POA: Diagnosis not present

## 2021-09-16 DIAGNOSIS — A419 Sepsis, unspecified organism: Secondary | ICD-10-CM | POA: Diagnosis not present

## 2021-09-16 LAB — CBC
HCT: 29.7 % — ABNORMAL LOW (ref 39.0–52.0)
Hemoglobin: 9.9 g/dL — ABNORMAL LOW (ref 13.0–17.0)
MCH: 31.1 pg (ref 26.0–34.0)
MCHC: 33.3 g/dL (ref 30.0–36.0)
MCV: 93.4 fL (ref 80.0–100.0)
Platelets: 253 10*3/uL (ref 150–400)
RBC: 3.18 MIL/uL — ABNORMAL LOW (ref 4.22–5.81)
RDW: 12.6 % (ref 11.5–15.5)
WBC: 12.5 10*3/uL — ABNORMAL HIGH (ref 4.0–10.5)
nRBC: 0 % (ref 0.0–0.2)

## 2021-09-16 LAB — GLUCOSE, CAPILLARY
Glucose-Capillary: 114 mg/dL — ABNORMAL HIGH (ref 70–99)
Glucose-Capillary: 116 mg/dL — ABNORMAL HIGH (ref 70–99)
Glucose-Capillary: 126 mg/dL — ABNORMAL HIGH (ref 70–99)
Glucose-Capillary: 83 mg/dL (ref 70–99)
Glucose-Capillary: 99 mg/dL (ref 70–99)

## 2021-09-16 NOTE — Progress Notes (Signed)
Speech Language Pathology Treatment: Dysphagia  ?Patient Details ?Name: Ricky Hayden ?MRN: [id number] ?DOB: [birth year] ?Today's Date: 09/16/2021 ?Time: 3300-7622 ?SLP Time Calculation (min) (ACUTE ONLY): 17 min ? ?Assessment / Plan / Recommendation ?Clinical Impression ? Messaged by RN stating that patient was coughing with am meal. Diagnostic treatment complete this am with SLP providing po trials of mechanical soft solids and thin liquids at bedside. No overt s/s of aspiration observed with solids but with liquids, patient with subtle throat clearing and one episode of delayed cough post swallow with liquids. Note that patient does have some congested coughing at baseline making bedside diagnostics challenging. Overall, he has appeared to tolerate his po diet well since initiated on 5/13 however he did have prolonged intubation this admission increasing risk of aspiration, particularly silent aspiration, which could have been occurring previously and is no longer silent as sensation returning. Instrumental exam will be beneficial to evaluate swallowing physiology. MBS planned for 1030 today.  ?  ?HPI HPI: Ricky Hayden is a 76 yo gentleman w/ pertinent PMH of chronic HFrEF with ICD, a-fib, CKD stage IIIb, COPD who presented to [facility] on 5/2 w/ hematuria x 3 weeks. The week prior to admission he was started on Bactrim for urine cultures showing proteus mirabilis. He began having worsening fever and SOB and was brought to [facility] ED on 4/27.  At admission he was initially started on BiPAP for SOB. Fever 103 F. Started on empiric abx. CT abdomen showing nonobstructive nephrolithiasis. Urology consulted with no indication for procedure.  Further work up showing sepsis from UTI w/ pyelonephritis. Blood cultures were positive for proteus mirabilis. On Ancef with ID managing antibiotics.  On 5/2 he began having decreased LOC and increased O2 requirements. Intubated 5/3 with short PEA arrest after intubation. Extubated 5/12. ?  ?    ?SLP Plan ? MBS ? ?  ?  ?Recommendations for follow up therapy are one component of a multi-disciplinary discharge planning process, led by the attending physician.  Recommendations may be updated based on patient status, additional functional criteria and insurance authorization. ?  ? ?Recommendations  ?Diet recommendations: Thin liquid;Dysphagia 3 (mechanical soft) ?Liquids provided via: Cup;Straw ?Medication Administration: Whole meds with puree ?Supervision: Patient able to self feed;Full supervision/cueing for compensatory strategies ?Compensations: Slow rate;Small sips/bites ?Postural Changes and/or Swallow Maneuvers: Seated upright 90 degrees  ?   ?    ?   ? ? ? ? Oral Care Recommendations: Oral care BID ?Follow Up Recommendations: No SLP follow up ?Assistance recommended at discharge: None ?SLP Visit Diagnosis: Dysphagia, unspecified (R13.10) ?Plan: MBS ? ? ? ? ?  ?  ?Ricky Mcguirt MA, CCC-SLP ? ? ?Doreena Maulden Meryl ? ?09/16/2021, 9:51 AM ?

## 2021-09-16 NOTE — Progress Notes (Signed)
?PROGRESS NOTE ? ? ? ?Ricky Hayden  VOJ:[id number] DOB: 1945/09/11 DOA: 08/28/2021 ?PCP: [facility]  ? ? 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  He was initially started on BiPAP.  Started on empiric antibiotics.  CT scan abdomen pelvis showed nonobstructive nephrolithiasis.  Admitted to the medical floor treated for sepsis secondary to UTI with pyelonephritis.  Blood cultures were positive for Proteus mirabilis.  On 5/2 decreased level of consciousness and increased oxygen requirement, placed on BiPAP, became hypotensive and intubated for worsened mental status. ?5/12, extubated after diuresing. ?5/13, transferred to medical floor. ?Stabilizing. ? ? ?Assessment & Plan: ?  ?Acute respiratory failure with hypoxemia, multifactorial.  Underlying history of COPD and chronic combined systolic and diastolic heart failure and fluid overload.  Possible underlying sleep apnea, untreated.  On 2 L oxygen at home. ? ?Currently extubated to nasal cannula oxygen. ?Remains on aggressive bronchodilator therapy, chest physiotherapy. ?Completed antibiotic treatment. ?Completed IV steroids. ?Continue with steroid inhalers and nebulizers. ?Gently diuresed with improvement in volume status.  Was seen by nephrology. ?Blood pressures are adequate.  Torsemide 20 mg twice daily.  Tolerating Entresto today.   ?Recheck x-ray today, if still with vascular congestion, will give 1 dose of IV Lasix. ? ?Septic shock: Secondary to Proteus mirabilis UTI and BPH.  Bacteremia cleared.  Completed 10 days of antibiotic therapy.  Tolerating finasteride for BPH.  Currently no evidence of retention.   ? ?AKI on CKD stage IIIb: Improving  off diuretics.  Creatinine at about baseline. ? ?Paroxysmal A-fib: In sinus rhythm.  On amiodarone.  Therapeutic on Eliquis. ?There is potential interaction with Eliquis and phenobarbital, however there is no safe alternative.  Other anticoagulant will have interaction with amiodarone. ?Patient is tolerating phenobarbital for last 27 years, will continue.   ? ?Type 2 diabetes: On Jardiance at home.  Currently remains on insulin. ? 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  We will continue. ? ?Deconditioning, debility: PT OT.  Refer to SNF. ? ?Suspected sleep apnea: Patient would never go on CPAP or BiPAP, keep on nocturnal oxygen. ? ?Discussed and updated patient's daughter on the phone, current plan is to go to a SNF for short-term rehab and go home with adequate support system from [place] facilities.  Stable to transfer to SNF when bed is available. ?Remains in poor clinical status, high chance of decompensation.  He will benefit with goal of care discussion.  We will consult palliative care. ? ? ? ?DVT prophylaxis:  ?apixaban (ELIQUIS) tablet 5 mg  ? ?Code Status: Full code ?Family Communication: Daughter on the phone 5/15 ?Disposition Plan: Status is: Inpatient ?Remains inpatient appropriate because: Unsafe discharge plan. ?  ? ? ?Consultants:  ?Critical care ?Nephrology ?Palliative care, consulted ? ?Procedures:  ?Intubation and extubation ? ?Antimicrobials:  ?Completed antibiotics ? ? ?Subjective: ? ?Patient seen and examined.  More alert and awake today.  Has dry cough.  Denies any shortness of breath.  Patient denies any nausea vomiting. ? ?Objective: ?Vitals:  ? 09/15/21 2300 09/16/21 0300 09/16/21 0700 09/16/21 0904  ?BP: 133/75 135/67 136/76   ?Pulse: 68 73 77 76  ?Resp: 20 18 (!) 21 (!) 22  ?Temp: 97.9 ?F (36.6 ?  C) 98.5 ?F (36.9 ?C)    ?TempSrc: Oral Oral    ?SpO2: 96% 97% 97% 97%  ?Weight:      ?Height:      ? ? ?Intake/Output Summary (Last 24 hours) at 09/16/2021  1123 ?Last data filed at 09/16/2021 0615 ?Gross per 24 hour  ?Intake 460 ml  ?Output 3225 ml  ?Net -2765 ml  ? ?Filed Weights  ? 09/10/21 0320 09/11/21 0500 09/12/21 0500  ?Weight: 106.2 kg 107.7 kg 103.9 kg  ? ? ?Examination: ? ?General exam: Appears calm and comfortable .  Frail and debilitated.  Anxious.  On 2 L oxygen that he uses at home. ?Chronically sick looking.   ?Respiratory system: Mostly clear. ?Cardiovascular system: S1 & S2 heard, RRR.  Generalized anasarca.  ICD in place. ?Gastrointestinal system: Soft.  Obese and pendulous.   ?Central nervous system: Alert and oriented. No focal neurological deficits.  Generalized weakness. ?Extremities: Symmetric 5 x 5 power.  Generalized weakness. ?Skin: No rashes, lesions or ulcers ?Psychiatry: Judgement and insight appear normal.  Anxious mood and flat affect. ? ? ? ?Data Reviewed: I have personally reviewed following labs and imaging studies ? ?CBC: ?Recent Labs  ?Lab 09/12/21 ?7628 09/13/21 ?0115 09/14/21 ?3151 09/15/21 ?7616 09/16/21 ?0737  ?WBC 6.8 8.5 8.3 9.1 12.5*  ?HGB 8.4* 8.9* 9.4* 9.5* 9.9*  ?HCT 26.9* 28.4* 29.3* 30.0* 29.7*  ?MCV 98.5 97.9 93.9 94.3 93.4  ?PLT 154 214 253 257 253  ? ?Basic Metabolic Panel: ?Recent Labs  ?Lab 09/10/21 ?0310 09/11/21 ?0500 09/12/21 ?1062 09/13/21 ?6948 09/14/21 ?5462 09/15/21 ?0146  ?NA 145 148* 151* 146* 143 143  ?K 5.1 4.9 4.4 4.2 4.1 4.4  ?CL 109 112* 116* 114* 109 111  ?CO2 30 29 27 27 26 25   ?GLUCOSE 177* 205* 232* 122* 99 102*  ?BUN 98* 106* 89* 69* 53* 47*  ?CREATININE 3.34* 3.22* 2.05* 1.54* 1.33* 1.21  ?CALCIUM 8.6* 8.5* 9.1 8.8* 9.0 9.0  ?MG 2.9*  --  3.0*  --   --  2.0  ?PHOS 5.5* 5.8*  --   --   --  4.1  ? ?GFR: ?Estimated Creatinine Clearance: 58.7 mL/min (by C-G formula based on SCr of 1.21 mg/dL). ?Liver Function Tests: ?Recent Labs  ?Lab 09/10/21 ?0310 09/11/21 ?0500 09/12/21 ?7035 09/13/21 ?0093  ?AST  --   --  90* 104*  ?ALT  --   --  128* 128*  ?ALKPHOS  --   --  135* 105  ?BILITOT  --   --  0.6 0.7   ?PROT  --   --  7.1 6.6  ?ALBUMIN 1.8* 1.8* 2.0* 1.9*  ? ?No results for input(s): LIPASE, AMYLASE in the last 168 hours. ?No results for input(s): AMMONIA in the last 168 hours. ?Coagulation Profile: ?No results for input(s): INR, PROTIME in the last 168 hours. ?Cardiac Enzymes: ?No results for input(s): CKTOTAL, CKMB, CKMBINDEX, TROPONINI in the last 168 hours. ?BNP (last 3 results) ?No results for input(s): PROBNP in the last 8760 hours. ?HbA1C: ?No results for input(s): HGBA1C in the last 72 hours. ?CBG: ?Recent Labs  ?Lab 09/14/21 ?2130 09/15/21 ?8182 09/15/21 ?1151 09/15/21 ?1637 09/16/21 ?9937  ?GLUCAP 139* 100* 124* 161* 114*  ? ?Lipid Profile: ?No results for input(s): CHOL, HDL, LDLCALC, TRIG, CHOLHDL, LDLDIRECT in the last 72 hours. ?Thyroid Function Tests: ?No results for input(s): TSH, T4TOTAL, FREET4, T3FREE, THYROIDAB in the last 72 hours. ?Anemia Panel: ?No results for input(s): VITAMINB12, FOLATE, FERRITIN, TIBC, IRON, RETICCTPCT in the  last 72 hours. ?Sepsis Labs: ?No results for input(s): PROCALCITON, LATICACIDVEN in the last 168 hours. ? ?Recent Results (from the past 240 hour(s))  ?Culture, blood (Routine X 2) w Reflex to ID Panel     Status: None  ? Collection Time: 09/08/21  4:53 PM  ? Specimen: BLOOD  ?Result Value Ref Range Status  ? Specimen Description BLOOD RIGHT ANTECUBITAL  Final  ? Special Requests   Final  ?  BOTTLES DRAWN AEROBIC AND ANAEROBIC Blood Culture results may not be optimal due to an inadequate volume of blood received in culture bottles  ? Culture   Final  ?  NO GROWTH 5 DAYS ?Performed at [facility], [place] [address] [place], [place] [address] ?  ? Report Status 09/13/2021 FINAL  Final  ?Culture, blood (Routine X 2) w Reflex to ID Panel     Status: None  ? Collection Time: 09/08/21  4:53 PM  ? Specimen: BLOOD  ?Result Value Ref Range Status  ? Specimen Description BLOOD RIGHT ANTECUBITAL  Final  ? Special Requests IN PEDIATRIC BOTTLE Blood Culture adequate volume   Final  ? Culture   Final  ?  NO GROWTH 5 DAYS ?Performed at [facility], [place] [address] [place], [place] [address] ?  ? Report Status 09/13/2021 FINAL  Final  ?Culture, Respiratory w Gram Stai

## 2021-09-16 NOTE — Progress Notes (Signed)
Occupational Therapy Treatment ?Patient Details ?Name: Ricky Hayden ?MRN: [id number] ?DOB: [birth year] ?Today's Date: 09/16/2021 ? ? ?History of present illness Pt is a 76 y.o. male admitted 08/28/21 with fever, chills, SOB. Workup for sepsis secondary to nonobstructing nephrolithiasis/pyelonephritis and concurrent UTI, acute hypoxic respiratory failure secondary to volume overload, AKI on CKD. Course complicated by decline in respiratory status 5/3; ETT 5/3-5/12. PMH includes CHF, ICD, CKF, afib, COPD, seizures. ?  ?OT comments ? Pt able to transfer to the EOB at mod to max assist +2 with co-treat as well as complete sit to stand 3X for 15-45 seconds at the same level.  Still with slow cognitive processing decreased initiation, and decreased awareness.  Oxygen sats maintained at 92% or greater on room air throughout activity with HR remaining stable.  Will continue to follow for acute care needs with recommendation for more intense rehab in AIR setting.    ? ?Recommendations for follow up therapy are one component of a multi-disciplinary discharge planning process, led by the attending physician.  Recommendations may be updated based on patient status, additional functional criteria and insurance authorization. ?   ?Follow Up Recommendations ? Acute inpatient rehab (3hours/day)  ?  ?Assistance Recommended at Discharge Frequent or constant Supervision/Assistance  ?Patient can return home with the following ? Assistance with cooking/housework;Help with stairs or ramp for entrance;Direct supervision/assist for financial management;Direct supervision/assist for medications management;Two people to help with walking and/or transfers;Two people to help with bathing/dressing/bathroom;Assist for transportation ?  ?Equipment Recommendations ? Other (comment) (TBD next venue of care)  ?  ?   ?Precautions / Restrictions Precautions ?Precautions: Fall ?Restrictions ?Weight Bearing Restrictions: No  ? ? ?  ? ?Mobility Bed  Mobility ?Overal bed mobility: Needs Assistance ?Bed Mobility: Rolling, Sidelying to Sit ?Rolling: Mod assist, +2 for physical assistance ?Sidelying to sit: +2 for physical assistance, Max assist ?  ?  ?  ?General bed mobility comments: Pt needed assist for bringing LEs off of the bed as well as for lifting trunk up to sitting. ?  ? ?Transfers ?Overall transfer level: Needs assistance ?Equipment used: Rolling walker (2 wheels), 2 person hand held assist [employer] utilized for transfer to the bedside recliner) ?Transfers: Sit to/from Stand, Bed to chair/wheelchair/BSC ?Sit to Stand: Max assist, +2 physical assistance ?  ?  ?  ?  ?  ?General transfer comment: Performed initial sit<>stand without DME, maxA+2 for trunk elevation, hip extension and stability; additional standing trial with RW and maxA+2, pt able to take side steps along EOB with modA for weight shifts and RW management before needing to sit due to fatigue; additional sit<>stand with transfer to recliner using stedy, total assist +2; pt requiring increased assist with fatigue, as well as increased time to process ?  ?  ?Balance Overall balance assessment: Needs assistance ?Sitting-balance support: Feet supported, Bilateral upper extremity supported, No upper extremity supported ?Sitting balance-Leahy Scale: Fair ?Sitting balance - Comments: initial modA for static sitting balance, progressing to min guard ?  ?  ?Standing balance-Leahy Scale: Poor ?Standing balance comment: Max +2 for standing balanced with flexed posture without an assistive device.  Mod assist for standing with use of the RW for support. ?  ?  ?  ?  ?  ?  ?  ?  ?  ?  ?  ?   ? ?ADL either performed or assessed with clinical judgement  ? ?ADL Overall ADL's : Needs assistance/impaired ?  ?  ?Grooming: Dance movement psychotherapist;Supervision/safety ?Grooming Details (indicate cue type  and reason): Pt able to wash his face sitting EOB when presented with washcloth. ?  ?  ?  ?  ?  ?  ?Lower Body Dressing:  Total assistance ?Lower Body Dressing Details (indicate cue type and reason): total assist for donning gripper socks ?Toilet Transfer: +2 for physical assistance;Maximal assistance;Stand-pivot ?Toilet Transfer Details (indicate cue type and reason): simulated stand pivot ?Toileting- Clothing Manipulation and Hygiene: +2 for physical assistance;Maximal assistance;Sit to/from stand ?  ?  ?  ?  ?General ADL Comments: Pt able to transfer to the EOB with max assist +2.  Once on the EOB, he was able to maintain sitting balance during grooming tasks at min guard assist.  Total +2 for sit to stand intervals of 15 to approximately 45 seconds using bilaterals therapists for support as well as RW.  Total +2 for use of the Stedy secondary to decreased initiation for sit to stand.  Oxygen sats maintained at 92% or greater on room air throughout session. ?  ? ? ?   ?   ?   ? ?Cognition Arousal/Alertness: Awake/alert ?Behavior During Therapy: Flat affect ?Overall Cognitive Status: Impaired/Different from baseline ?Area of Impairment: Attention, Memory, Following commands, Safety/judgement, Awareness, Problem solving, Orientation ?  ?  ?  ?  ?  ?  ?  ?  ?Orientation Level: Disoriented to, Time, Situation (Pt oriented to place but not day of the week, year, or month) ?Current Attention Level: Sustained ?Memory: Decreased short-term memory ?Following Commands: Follows one step commands inconsistently ?Safety/Judgement: Decreased awareness of safety, Decreased awareness of deficits ?Awareness: Emergent ?Problem Solving: Slow processing, Difficulty sequencing, Requires verbal cues, Requires tactile cues, Decreased initiation ?General Comments: Pt needed max demonstrational cueing for initiation and placement of his hands on the surface of the bed to push up from during transfer. ?  ?  ?   ?   ?   ?General Comments SpO2 >/92% on RA with mobility. Pt c/o dizziness upon sitting and post-standing; BP stable (sitting BP 117/72,  post-standing BP 125/56), HR 80s-100s  ? ? ?Pertinent Vitals/ Pain       Pain Assessment ?Pain Assessment: Faces ?Faces Pain Scale: Hurts little more ?Pain Location: back pain ?Pain Descriptors / Indicators: Discomfort, Grimacing, Tightness ?Pain Intervention(s): Repositioned, Monitored during session ? ?   ?   ? ?Frequency ? Min 2X/week  ? ? ? ? ?  ?Progress Toward Goals ? ?OT Goals(current goals can now be found in the care plan section) ? Progress towards OT goals: Progressing toward goals ? ?Acute Rehab OT Goals ?Patient Stated Goal: Pt did not state but agreeable to therapy ?OT Goal Formulation: With patient/family ?Time For Goal Achievement: 09/27/21 ?Potential to Achieve Goals: Good  ?Plan Discharge plan remains appropriate   ? ?Co-evaluation ? ? ? PT/OT/SLP Co-Evaluation/Treatment: Yes ?Reason for Co-Treatment: Complexity of the patient's impairments (multi-system involvement);For patient/therapist safety ?PT goals addressed during session: Mobility/safety with mobility;Balance ?OT goals addressed during session: ADL's and self-care ?  ? ?  ?AM-PAC OT "6 Clicks" Daily Activity     ?Outcome Measure ? ? Help from another person eating meals?: None ?Help from another person taking care of personal grooming?: A Little ?Help from another person toileting, which includes using toliet, bedpan, or urinal?: Total ?Help from another person bathing (including washing, rinsing, drying)?: Total ?Help from another person to put on and taking off regular upper body clothing?: A Lot ?Help from another person to put on and taking off regular lower body clothing?: A Lot ?  6 Click Score: 13 ? ?  ?End of Session Equipment Utilized During Treatment: Gait belt;Other (comment) Charlaine Dalton) ? ?OT Visit Diagnosis: Unsteadiness on feet (R26.81);Other abnormalities of gait and mobility (R26.89);Muscle weakness (generalized) (M62.81);Other symptoms and signs involving cognitive function ?  ?Activity Tolerance Patient tolerated treatment  well ?  ?Patient Left with call bell/phone within reach;in chair;with chair alarm set ?  ?Nurse Communication Other (comment) (O2 status and need for maximove for back to bed) ?  ? ?   ? ?Time: 8257-4935 ?OT Time Calculation (mi

## 2021-09-16 NOTE — Consult Note (Signed)
? ?                                                                                ?Consultation Note ?Date: 09/16/2021  ? ?Patient Name: Ricky Hayden  ?DOB: 05/31/1945  MRN: [id number]  Age / Sex: 76 y.o., male  ?PCP: [facility] ?Referring Physician: Barb Merino, MD ? ?Reason for Consultation: Establishing goals of care ? ?HPI/Patient Profile: 76 y.o. 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 ? ?Patient was admitted for sepsis from UTI and required BiPAP in the ED. Patient was weaned off but then on 5/2 had decreased level of consciousness and increased oxygen requirement, placed back on BiPAP, became hypotensive and intubated for worsened mental status. Patient was extubated on 5/12. PMT has been consulted to assist with goals of care conversation.   ? ?Clinical Assessment and Goals of Care: ? ?I have reviewed medical records including EPIC notes, labs and imaging, received report from RN, assessed the patient and then met at the bedside to discuss diagnosis prognosis, GOC, EOL wishes, disposition and options. ? ?I introduced Palliative Medicine as specialized medical care for people living with serious illness. It focuses on providing relief from the symptoms and stress of a serious illness. The goal is to improve quality of life for both the patient and the family. ? ?We discussed a brief life review of the patient and then focused on their current illness.  ? ?I attempted to elicit values and goals of care important to the patient.   ? ?Medical History Review and Understanding: ?Reviewed course of illness including UTI, sepsis with bacteremia, intubation for 10 days, brief PEA arrest, and ongoing deconditioning. Patient confirms his understanding. ? ?Social History: ?Patient shares that his wife died 2  years ago after 64 years of marriage. He has 2 sons and 2 daughters who are supportive. There is not much that he does for fun lately. ? ?Functional and Nutritional State: ?Patient tells me that prior to admission he was able to ambulate with a walker and had no problems getting around. His first attempt to stand with therapy was today and he says it was "killer" and "my worst day here."  ? ? ?Advance Directives: ?A detailed discussion regarding advanced directives was had. ? ? ?Code Status: ?Concepts specific to code status, artifical feeding and hydration, and rehospitalization were considered and discussed. ? ?Discussion: ?Patient is having a hard time focusing on goals of care discussion today as he is in a great pain from sitting in the bedside chair. He shares that he had a difficult time with therapy and we discussed his thoughts on the options of SNF versus CIR, given his discomfort. Counseled on the differences and the important of considering long-term goals of care when making decisions. At this time, patient feels that CIR would be too difficult for him. He understands that he may not maximize functional mobility with SNF but has done this in the past and voices his preference for SNF. One of his goals is to be  able to join a singing group again. I attempted to explore his thoughts on CODE STATUS having now experienced some of the recovery process and challenges often faced if resuscitation efforts are successful. He tells me if faced with this again "I would rather shoot myself" but is unable to describe further thoughts on DNR/DNI given his preference to avoid CPR/intubation. Redirected patients several times as he wanted to get up out of the chair without assistance from PT. He voices appreciation for this PA's visit and is agreeable to continuing the discussion when he is less tired and uncomfortable. ? ? ?Discussed the importance of continued conversation with family and the medical providers  regarding overall plan of care and treatment options, ensuring decisions are within the context of the patient?s values and GOCs.  ? ?Questions and concerns were addressed.  The patient was encouraged to call with questions or concerns.  PMT will continue to support holistically.  ?  ? ?SUMMARY OF RECOMMENDATIONS   ?-Full scope, patient seems to prefer avoidance of mechanical ventilation but will need to discuss further ?-Full scope treatment ?-Patient is hesitant about CIR but willing to pursue SNF for rehab and strengthening ?-Psychosocial and emotional support provided ?-PMT will continue to follow for ongoing [place] discussion  ? ?Prognosis:  ?Guarded ? ?Discharge Planning: To Be Determined  ? ?  ? ?Primary Diagnoses: ?Present on Admission: ? (Resolved) Sepsis ([place]) ? (Resolved) UTI (urinary tract infection) ? BPH (benign prostatic hyperplasia) ? COPD (chronic obstructive pulmonary disease) ([place]) ? Chronic combined systolic and diastolic CHF, NYHA class 2 ([place]) ? Proteus (mirabilis) ([place]) as the cause of diseases classified elsewhere ? Septic shock ([place]) ? Acute respiratory failure with hypoxia ([place]) ? Bacteremia due to Gram-negative bacteria ? Complicated UTI (urinary tract infection) ? ARF (acute renal failure) ([place]) ? CKD (chronic kidney disease) stage 3, GFR 30-59 ml/min (HCC) ? Type 2 diabetes mellitus with chronic kidney disease, without long-term current use of insulin ([place]) ? ? ?I have reviewed the medical record, interviewed the patient and family, and examined the patient. The following aspects are pertinent. ? ?Past Medical History:  ?Diagnosis Date  ? Arthritis   ? osteoarthritis of left knee  ? Ascending aorta dilatation (HCC)   ? 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 Cardiac arrhythmia   ? life threatening, secondary to CCB vs b- blockers  ? Cardiomyopathy ([place])   ? Chronic joint pain   ? Chronic systolic CHF (congestive heart failure) ([place])   ? CKD (chronic kidney disease), stage III ([place])   ? COPD  (chronic obstructive pulmonary disease) ([place])   ? Coronary artery disease   ? Dilated aortic root ([place])   ? Enlarged prostate   ? 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 GERD (gastroesophageal reflux disease)   ? Gout   ? Hiatal hernia   ? Hyperlipidemia   ? Hypertension   ? Intracranial hematoma ([place]) 1995  ? history of, s/p evacuation by Dr. Sherwood Gambler  ? Nocturia   ? Obesity   ? Pansinusitis   ? a.  complicated by brain abscess and bleeding requiring craniotomy in 1995.  ? PONV (postoperative nausea and vomiting)   ? Sinusitis   ? s/p ethmoidectomy and nasal septoplasty  ? Vertigo   ? intermitantly  ? ?Social History  ? ?Socioeconomic History  ? Marital status: Married  ?  Spouse name: Not on file  ? Number of children: Not on file  ? Years of education:  Not on file  ? Highest education level: Not on file  ?Occupational History  ? Not on file  ?Tobacco Use  ? Smoking status: Former  ?  Types: Cigarettes  ?  Quit date: 05/04/1986  ?  Years since quitting: 35.3  ? Smokeless tobacco: Never  ?Vaping Use  ? Vaping Use: Never used  ?Substance and Sexual Activity  ? Alcohol use: No  ?  Alcohol/week: 0.0 standard drinks  ? Drug use: No  ? Sexual activity: Yes  ?Other Topics Concern  ? Not on file  ?Social History Narrative  ? Not on file  ? ?Social Determinants of Health  ? ?Financial Resource Strain: Not on file  ?Food Insecurity: Not on file  ?Transportation Needs: Not on file  ?Physical Activity: Not on file  ?Stress: Not on file  ?Social Connections: Not on file  ? ?Family History  ?Problem Relation Age of Onset  ? Heart failure Mother 73  ? Heart attack Father 69  ? ?Scheduled Meds: ? amiodarone  200 mg Oral Daily  ? apixaban  5 mg Oral BID  ? carvedilol  6.25 mg Oral BID WC  ? Chlorhexidine Gluconate Cloth  6 each Topical Daily  ? insulin aspart  0-20 Units Subcutaneous TID WC  ? insulin glargine-yfgn  15 Units Subcutaneous Daily  ? ipratropium-albuterol  3 mL Nebulization BID  ? melatonin  3 mg  Oral QHS  ? pantoprazole  40 mg Oral Daily  ? PHENobarbital  64.8 mg Oral BID  ? pravastatin  40 mg Oral QPM  ? sacubitril-valsartan  1 tablet Oral BID  ? torsemide  20 mg Oral BID  ? ?Continuous Infusions: ? sodium chlo

## 2021-09-16 NOTE — Progress Notes (Signed)
Physical Therapy Treatment ?Patient Details ?Name: Ricky Hayden ?MRN: [id number] ?DOB: [birth year] ?Today's Date: 09/16/2021 ? ? ?History of Present Illness Pt is a 76 y.o. male admitted 08/28/21 with fever, chills, SOB. Workup for sepsis secondary to nonobstructing nephrolithiasis/pyelonephritis and concurrent UTI, acute hypoxic respiratory failure secondary to volume overload, AKI on CKD. Course complicated by decline in respiratory status 5/3; ETT 5/3-5/12. PMH includes CHF, ICD, CKF, afib, COPD, seizures. ?  ?PT Comments  ? ? Pt progressing with mobility. Today's session focused on transfer training and standing tolerance with RW, including side steps and transfer to recliner with stedy. Pt remains limited by generalized weakness, decreased activity tolerance, poor balance strategies/postural reactions and impaired cognition, including slowed processing, decreased attention and poor attention. Continue to recommend intensive CIR-level therapies to maximize functional mobility and independence prior to return home. ?   ?Recommendations for follow up therapy are one component of a multi-disciplinary discharge planning process, led by the attending physician.  Recommendations may be updated based on patient status, additional functional criteria and insurance authorization. ? ?Follow Up Recommendations ? Acute inpatient rehab (3hours/day) ?  ?  ?Assistance Recommended at Discharge Frequent or constant Supervision/Assistance  ?Patient can return home with the following Two people to help with walking and/or transfers;A lot of help with bathing/dressing/bathroom;Assistance with cooking/housework;Assist for transportation;Help with stairs or ramp for entrance ?  ?Equipment Recommendations ? TBD - Rolling walker (2 wheels);BSC/3in1;Wheelchair (measurements PT);Wheelchair cushion (measurements PT)  ?  ?Recommendations for Other Services   ? ? ?  ?Precautions / Restrictions Precautions ?Precautions:  Fall ?Restrictions ?Weight Bearing Restrictions: No  ?  ? ?Mobility ? Bed Mobility ?Overal bed mobility: Needs Assistance ?Bed Mobility: Rolling, Sidelying to Sit ?Rolling: Mod assist, +2 for physical assistance ?Sidelying to sit: Mod assist, +2 for physical assistance ?  ?  ?  ?  ?  ? ?Transfers ?Overall transfer level: Needs assistance ?Equipment used: 1 person hand held assist, Rolling walker (2 wheels), Ambulation equipment used ?Transfers: Sit to/from Stand ?Sit to Stand: Max assist, +2 physical assistance ?  ?  ?  ?  ?  ?General transfer comment: Performed initial sit<>stand without DME, maxA+2 for trunk elevation, hip extension and stability; additional standing trial with RW and maxA+2, pt able to take side steps along EOB with modA for weight shifts and RW management before needing to sit due to fatigue; additional sit<>stand with transfer to recliner using stedy, maxA+2; pt requiring increased assist with fatigue, as well as increased time to process ?Transfer via Lift Equipment: Stedy ? ?Ambulation/Gait ?  ?  ?  ?  ?  ?  ?  ?  ? ? ?Stairs ?  ?  ?  ?  ?  ? ? ?Wheelchair Mobility ?  ? ?Modified Rankin (Stroke Patients Only) ?  ? ? ?  ?Balance Overall balance assessment: Needs assistance ?Sitting-balance support: Feet supported, Bilateral upper extremity supported, No upper extremity supported ?Sitting balance-Leahy Scale: Fair ?Sitting balance - Comments: initial modA for static sitting balance, progressing to min guard ?  ?Standing balance support: During functional activity, Bilateral upper extremity supported ?Standing balance-Leahy Scale: Poor ?Standing balance comment: reliant on BUE support and external assist ?  ?  ?  ?  ?  ?  ?  ?  ?  ?  ?  ?  ? ?  ?Cognition Arousal/Alertness: Awake/alert ?Behavior During Therapy: Flat affect ?Overall Cognitive Status: Impaired/Different from baseline ?Area of Impairment: Attention, Memory, Following commands, Safety/judgement, Awareness, Problem solving,  Orientation ?  ?  ?  ?  ?  ?  ?  ?  ?  ?  Current Attention Level: Sustained ?Memory: Decreased short-term memory ?Following Commands: Follows one step commands with increased time, Follows one step commands inconsistently ?Safety/Judgement: Decreased awareness of safety, Decreased awareness of deficits ?Awareness: Emergent ?Problem Solving: Slow processing, Difficulty sequencing, Requires verbal cues, Requires tactile cues, Decreased initiation ?General Comments: Followed one step commands with multimodal cues ?  ?  ? ?  ?Exercises General Exercises - Lower Extremity ?Long Arc Quad: AROM, Both, Seated ?Toe Raises: AROM, Both, Seated ?Heel Raises: AROM, Both, Seated ? ?  ?General Comments General comments (skin integrity, edema, etc.): SpO2 >/92% on RA with mobility. Pt c/o dizziness upon sitting and post-standing; BP stable (sitting BP 117/72, post-standing BP 125/56), HR 80s-100s ?  ?  ? ?Pertinent Vitals/Pain Pain Assessment ?Pain Assessment: Faces ?Faces Pain Scale: Hurts little more ?Pain Location: back and BLEs (suspect bilateral hamstring tightness with mobility/repositioning) ?Pain Descriptors / Indicators: Discomfort, Grimacing, Tightness ?Pain Intervention(s): Monitored during session, Repositioned  ? ? ?Home Living   ?  ?  ?  ?  ?  ?  ?  ?  ?  ?   ?  ?Prior Function    ?  ?  ?   ? ?PT Goals (current goals can now be found in the care plan section) Progress towards PT goals: Progressing toward goals ? ?  ?Frequency ? ? ? Min 3X/week ? ? ? ?  ?PT Plan Current plan remains appropriate  ? ? ?Co-evaluation PT/OT/SLP Co-Evaluation/Treatment: Yes ?Reason for Co-Treatment: Complexity of the patient's impairments (multi-system involvement);For patient/therapist safety ?PT goals addressed during session: Mobility/safety with mobility;Balance ?OT goals addressed during session: ADL's and self-care ?  ? ?  ?AM-PAC PT "6 Clicks" Mobility   ?Outcome Measure ? Help needed turning from your back to your side while in a  flat bed without using bedrails?: A Lot ?Help needed moving from lying on your back to sitting on the side of a flat bed without using bedrails?: Total ?Help needed moving to and from a bed to a chair (including a wheelchair)?: Total ?Help needed standing up from a chair using your arms (e.g., wheelchair or bedside chair)?: Total ?Help needed to walk in hospital room?: Total ?Help needed climbing 3-5 steps with a railing? : Total ?6 Click Score: 7 ? ?  ?End of Session Equipment Utilized During Treatment: Gait belt ?Activity Tolerance: Patient tolerated treatment well ?Patient left: in chair;with call bell/phone within reach;with chair alarm set ?Nurse Communication: Mobility status;Need for lift equipment ?PT Visit Diagnosis: Muscle weakness (generalized) (M62.81);Difficulty in walking, not elsewhere classified (R26.2);Unsteadiness on feet (R26.81) ?  ? ? ?Time: 2505-3976 ?PT Time Calculation (min) (ACUTE ONLY): 39 min ? ?Charges:  $Therapeutic Activity: 23-37 mins          ?          ? ?Mabeline Caras, PT, DPT ?Acute Rehabilitation Services  ?Pager [phone] ?Office [phone] ? ?Derry Lory ?09/16/2021, 1:07 PM ? ?

## 2021-09-16 NOTE — Progress Notes (Signed)
Modified Barium Swallow Progress Note ? ?Patient Details  ?Name: Ricky Hayden ?MRN: [id number] ?Date of Birth: [birth year] ? ?Today's Date: 09/16/2021 ? ?Modified Barium Swallow completed.  Full report located under Chart Review in the Imaging Section. ? ?Brief recommendations include the following: ? ?Clinical Impression ? Patient presents with mild pharyngeal phase dysphagia characterized primarily be decreased laryngeal closure s/p 10 day intubation, resulting in deep penetration of thin and nectar thick liquids. Penetrates almost reached the cords, clearing with inconsistent throat clear. Cued throat clear successful as well. Post swallow, he does have mild vallecular and pyriform sinus residue due to decreased hyolaryngeal movement which clears with typically spontaneous dry swallow. Recommend continuing current diet (patient with slow but efficient mastication) with aspiration precautions. He will benefit from continued SLP f/u to reinforce compensatory strategies to mitigate aspiration risk. ?  ?Swallow Evaluation Recommendations ? ?   ? ? SLP Diet Recommendations: Dysphagia 3 (Mech soft) solids;Thin liquid ? ? Liquid Administration via: Cup;Straw ? ? Medication Administration: Whole meds with puree ? ? Supervision: Patient able to self feed;Full supervision/cueing for compensatory strategies ? ? Compensations: Slow rate;Small sips/bites;Clear throat intermittently (clear throat every 3-4 sips/bites) ? ? Postural Changes: Remain semi-upright after after feeds/meals (Comment) ? ? Oral Care Recommendations: Oral care BID ? ?   ? ? ?Janesha Brissette MA, CCC-SLP ? ?Ricky Hayden ?09/16/2021,11:01 AM ?

## 2021-09-16 NOTE — Progress Notes (Signed)
? ?  Inpatient Rehab Admissions Coordinator : ? ?Per therapy recommendations, patient was screened for CIR candidacy by Voshon Petro RN MSN.  At this time patient appears to be a potential candidate for CIR. I will place a rehab consult per protocol for full assessment. Please call me with any questions. ? ?Jerilynn Feldmeier RN MSN ?Admissions Coordinator ?[phone] ?  ?

## 2021-09-16 NOTE — Progress Notes (Signed)
Nutrition Follow-up ? ?DOCUMENTATION CODES:  ? ?Not applicable ? ?INTERVENTION:  ? ?- Ensure Enlive po BID, each supplement provides 350 kcal and 20 grams of protein ? ?- Encourage PO intake ? ?- Rena-vit daily ? ?NUTRITION DIAGNOSIS:  ? ?Inadequate oral intake related to inability to eat as evidenced by NPO status. ? ?Progressing, pt now on a dysphagia 3 diet with thin liquids ? ?GOAL:  ? ?Patient will meet greater than or equal to 90% of their needs ? ?Progressing ? ?MONITOR:  ? ?PO intake, Supplement acceptance, Labs, Weight trends, Skin, I & O's ? ?REASON FOR ASSESSMENT:  ? ?Ventilator, Consult ?Enteral/tube feeding initiation and management, Assessment of nutrition requirement/status ? ?ASSESSMENT:  ? ?76 year old male who presented to the ED on 4/27 with SOB. PMH of CHF with ICD, atrial fibrillation, CKD stage IIIb, COPD, seizures, CAD, GERD, HLD, HTN, brain surgery. Pt admitted with sepsis secondary to UTI, non-obstructing nephrolithiasis/pyelonephritis, bacteremia. ? ?05/02 - rapid response due to decreased LOC and increased O2 needs ?05/03 - intubated, short PEA arrest after intubation ?05/12 - extubated, diet advanced to dysphagia 3 with thin liquids ?05/16 - MBS with recommendations to continue dysphagia 3 diet with thin liquids ? ?Palliative Care team following. Noted pt may d/c to CIR. ? ?Pt unavailable at time of RD visit. Pt with variable PO intake. Ensure Enlive ordered BID and pt accepting these per [facility] documentation. Will continue with current oral nutrition supplement regimen at this time. Will also order daily renal MVI. ? ?Admit weight: 107.2 kg ?Current weight: 103.9 kg on 5/12 ? ?Pt with mild pitting generalized edema, non-pitting edema to BUE, and mild pitting edema to BLE. ? ?Meal Completion: 25-100% ? ?Medications reviewed and include: Ensure Enlive BID, SSI, semglee 15 units daily, melatonin, protonix, phenobarbital, torsemide ? ?Labs reviewed: BUN 47 on 5/15, hemoglobin 10.1 ?CBG's:  83-144 x 24 hours ? ?UOP: 1400 ml x 24 hours ?I/O's: -7.5 L since admit ? ?Diet Order:   ?Diet Order   ? ?       ?  DIET DYS 3 Room service appropriate? Yes; Fluid consistency: Thin  Diet effective now       ?  ? ?  ?  ? ?  ? ? ?EDUCATION NEEDS:  ? ?Education needs have been addressed ? ?Skin:  Skin Assessment: ?Skin Integrity Issues: ?DTI: medial buttocks ?Stage II: anus ?Unstageable: nose ?Other: skin tear to back ? ?Last BM:  09/17/21 large type 6 ? ?Height:  ? ?Ht Readings from Last 1 Encounters:  ?08/29/21 5\' 7"  (1.702 m)  ? ? ?Weight:  ? ?Wt Readings from Last 1 Encounters:  ?09/12/21 103.9 kg  ? ? ?Ideal Body Weight:  67.3 kg ? ?BMI:  Body mass index is 35.88 kg/m?. ? ?Estimated Nutritional Needs:  ? ?Kcal:  2000-2200 ? ?Protein:  110-130 grams ? ?Fluid:  >/= 2.0 L ? ? ? ?Gustavus Bryant, MS, RD, LDN ?Inpatient Clinical Dietitian ?Please see AMiON for contact information. ? ?

## 2021-09-17 DIAGNOSIS — I480 Paroxysmal atrial fibrillation: Secondary | ICD-10-CM

## 2021-09-17 DIAGNOSIS — I5043 Acute on chronic combined systolic (congestive) and diastolic (congestive) heart failure: Secondary | ICD-10-CM | POA: Diagnosis not present

## 2021-09-17 DIAGNOSIS — J9601 Acute respiratory failure with hypoxia: Secondary | ICD-10-CM | POA: Diagnosis not present

## 2021-09-17 DIAGNOSIS — L89152 Pressure ulcer of sacral region, stage 2: Secondary | ICD-10-CM

## 2021-09-17 DIAGNOSIS — Z9889 Other specified postprocedural states: Secondary | ICD-10-CM

## 2021-09-17 DIAGNOSIS — A419 Sepsis, unspecified organism: Secondary | ICD-10-CM | POA: Diagnosis not present

## 2021-09-17 DIAGNOSIS — D649 Anemia, unspecified: Secondary | ICD-10-CM

## 2021-09-17 DIAGNOSIS — G40909 Epilepsy, unspecified, not intractable, without status epilepticus: Secondary | ICD-10-CM

## 2021-09-17 DIAGNOSIS — N179 Acute kidney failure, unspecified: Secondary | ICD-10-CM | POA: Diagnosis not present

## 2021-09-17 LAB — CBC
HCT: 29.6 % — ABNORMAL LOW (ref 39.0–52.0)
Hemoglobin: 10.1 g/dL — ABNORMAL LOW (ref 13.0–17.0)
MCH: 31.7 pg (ref 26.0–34.0)
MCHC: 34.1 g/dL (ref 30.0–36.0)
MCV: 92.8 fL (ref 80.0–100.0)
Platelets: 234 10*3/uL (ref 150–400)
RBC: 3.19 MIL/uL — ABNORMAL LOW (ref 4.22–5.81)
RDW: 12.9 % (ref 11.5–15.5)
WBC: 9 10*3/uL (ref 4.0–10.5)
nRBC: 0 % (ref 0.0–0.2)

## 2021-09-17 LAB — GLUCOSE, CAPILLARY
Glucose-Capillary: 86 mg/dL (ref 70–99)
Glucose-Capillary: 112 mg/dL — ABNORMAL HIGH (ref 70–99)
Glucose-Capillary: 144 mg/dL — ABNORMAL HIGH (ref 70–99)
Glucose-Capillary: 133 mg/dL — ABNORMAL HIGH (ref 70–99)
Glucose-Capillary: 118 mg/dL — ABNORMAL HIGH (ref 70–99)

## 2021-09-17 MED ORDER — ZINC OXIDE 12.8 % EX OINT
TOPICAL_OINTMENT | CUTANEOUS | Status: DC | PRN
  Administered 2021-09-17 – 2021-09-18 (×2): 1 via TOPICAL
  Filled 2021-09-17: qty 56.7

## 2021-09-17 MED ORDER — RENA-VITE PO TABS
1.0000 | ORAL_TABLET | Freq: Every day | ORAL | Status: DC
  Administered 2021-09-17 – 2021-09-18 (×2): 1 via ORAL
  Filled 2021-09-17 (×2): qty 1

## 2021-09-17 MED ORDER — TAMSULOSIN HCL 0.4 MG PO CAPS
0.4000 mg | ORAL_CAPSULE | Freq: Every day | ORAL | Status: DC
  Administered 2021-09-18 – 2021-09-19 (×2): 0.4 mg via ORAL
  Filled 2021-09-17 (×2): qty 1

## 2021-09-17 MED ORDER — FINASTERIDE 2.5 MG HALF TABLET
2.5000 mg | ORAL_TABLET | Freq: Every day | ORAL | Status: DC
  Administered 2021-09-17 – 2021-09-19 (×3): 2.5 mg via ORAL
  Filled 2021-09-17 (×3): qty 1

## 2021-09-17 MED ORDER — ENSURE ENLIVE PO LIQD
237.0000 mL | Freq: Two times a day (BID) | ORAL | Status: DC
  Administered 2021-09-17 – 2021-09-19 (×6): 237 mL via ORAL

## 2021-09-17 NOTE — Plan of Care (Signed)
?  Problem: Education: ?Goal: Knowledge of General Education information will improve ?Description: Including pain rating scale, medication(s)/side effects and non-pharmacologic comfort measures ?Outcome: Progressing ?  ?Problem: Health Behavior/Discharge Planning: ?Goal: Ability to manage health-related needs will improve ?Outcome: Progressing ?  ?Problem: Activity: ?Goal: Ability to tolerate increased activity will improve ?Outcome: Progressing ?  ?

## 2021-09-17 NOTE — Plan of Care (Signed)
?  Problem: Education: ?Goal: Knowledge of General Education information will improve ?Description: Including pain rating scale, medication(s)/side effects and non-pharmacologic comfort measures ?Outcome: Progressing ?  ?Problem: Health Behavior/Discharge Planning: ?Goal: Ability to manage health-related needs will improve ?Outcome: Progressing ?  ?Problem: Respiratory: ?Goal: Ability to maintain a clear airway and adequate ventilation will improve ?Outcome: Progressing ?  ?Problem: Role Relationship: ?Goal: Method of communication will improve ?Outcome: Progressing ?  ?

## 2021-09-17 NOTE — Progress Notes (Signed)
Speech Language Pathology Treatment: Dysphagia  ?Patient Details ?Name: Ricky Hayden ?MRN: [id number] ?DOB: March 29, 1946 ?Today's Date: 09/17/2021 ?Time: [phone] ?SLP Time Calculation (min) (ACUTE ONLY): 27 min ? ?Assessment / Plan / Recommendation ?Clinical Impression ? F/u after MBS. Skilled observation with prescribed diet complete. Patient able to independently recall compensatory strategies/aspiration precautions provided after MBS 5/16 however required moderate verbal cueing for carryover into self feeding task which SLP provided. Overall, appears to be tolerating current diet. Will continue to f/u.  ?  ?HPI HPI: Mr. Tafoya is a 76 yo gentleman w/ pertinent PMH of chronic HFrEF with ICD, a-fib, CKD stage IIIb, COPD who presented to [facility] on 5/2 w/ hematuria x 3 weeks. The week prior to admission he was started on Bactrim for urine cultures showing proteus mirabilis. He began having worsening fever and SOB and was brought to [facility] ED on 4/27.  At admission he was initially started on BiPAP for SOB. Fever 103 F. Started on empiric abx. CT abdomen showing nonobstructive nephrolithiasis. Urology consulted with no indication for procedure.  Further work up showing sepsis from UTI w/ pyelonephritis. Blood cultures were positive for proteus mirabilis. On Ancef with ID managing antibiotics.  On 5/2 he began having decreased LOC and increased O2 requirements. Intubated 5/3 with short PEA arrest after intubation. Extubated 5/12. ?  ?   ?SLP Plan ? Continue with current plan of care ? ?  ?  ?Recommendations for follow up therapy are one component of a multi-disciplinary discharge planning process, led by the attending physician.  Recommendations may be updated based on patient status, additional functional criteria and insurance authorization. ?  ? ?Recommendations  ?Diet recommendations: Dysphagia 3 (mechanical soft);Thin liquid ?Liquids provided via: Cup;Straw ?Medication Administration: Whole meds with puree ?Supervision:  Patient able to self feed;Full supervision/cueing for compensatory strategies ?Compensations: Slow rate;Small sips/bites;Clear throat intermittently ?Postural Changes and/or Swallow Maneuvers: Seated upright 90 degrees  ?   ?    ?   ? ? ? ? Oral Care Recommendations: Oral care BID ?Follow Up Recommendations: No SLP follow up ?Assistance recommended at discharge: None ?SLP Visit Diagnosis: Dysphagia, pharyngeal phase (R13.13) ?Plan: Continue with current plan of care ? ? ? ? ?  ?  ?Hanni Milford MA, CCC-SLP ? ? ?Burlie Cajamarca Meryl ? ?09/17/2021, 9:16 AM ?

## 2021-09-17 NOTE — Social Work (Signed)
CSW attempted to contact pt [facility] social worker, no answer and did not go to voicemail. CSW paged [place] social worker and will follow up when she returns the call. [facility] social worker information is in handoff's.  ?

## 2021-09-17 NOTE — Assessment & Plan Note (Signed)
With history of seizures ?- Continue phenobarbital ?

## 2021-09-17 NOTE — Progress Notes (Signed)
?Progress Note ? ? ?Patient: Ricky Hayden WUX:[id number] DOB: 1945/10/15 DOA: 08/28/2021     19 ?DOS: the patient was seen and examined on 09/17/2021 at 8:32AM ?  ? ? ? ?Brief hospital course: ?Mr. Varma is a 76 y.o. M with chronic systolic HF, Afib, chronic kidney disease and COPD not on home O2 and hx ICH s/p evacuation 1995 who presented with hematuria then fever and SOB. ? ?In the ER, required BiPAP.  CT showed kidney stones without hydro.  Urology consulted, no stenting needed.  Admitted and blood cultures with Proteus.  ID on board. ? ?On 5/2 developed increased O2 requirements, CCM consulted and transferred to ICU. ? ?5/12, extubated after diuresing. ?5/13, transferred to medical floor. ?5/14-5/17, stabilizing. ? ? ? ? ?Assessment and Plan: ?Pressure injury of sacral region, stage 2 ([place]) ?Not POA ? ?Normocytic anemia ?Hemoglobin stable today, no clinical bleeding ? ?Seizure disorder ([place]) ?- Continue phenbobarbital ? ?History of craniotomy ?With history of seizures ?- Continue phenobarbital ? ?Paroxysmal atrial fibrillation (HCC) ?On apixaban from outpatient [place]ologist, known interaction with Luminal. ?- Continue apixaban and amiodarone and Coreg ? ?Type 2 diabetes mellitus with chronic kidney disease, without long-term current use of insulin ([place]) ?GLucose normal ?- Continue glargine ?- Continue SS corrections ?- Hold SGLT2i ? ?Complicated UTI (urinary tract infection) ?See above ? ?Bacteremia due to Gram-negative bacteria ?Completed 10 days antibiotics.  Now resolved. ? ?Acute respiratory failure with hypoxia ([place]) ?After admission, the patient developed septic shock with respiratory failure requiring intubation.  This is now resolved and he weaning off oxygen. ?- Continue pulmonary toilet ?- Continue torsemide ?- Plan to attempt to wean oxygen during rehabilitation over the next several weeks ? ? ? ?Septic shock ([place]) ?See above ? ?Proteus (mirabilis) (morganii) as the cause of diseases classified  elsewhere ?See above ? ?ARF (acute renal failure) ([place]) ?Baseline Cr 1.3-1.6, CKD IIIb, but here had renal failure up to 3.7 due to septic shock.  Now resolved. ? ?Stage 3b chronic kidney disease (CKD) ([place]) ?Cr improved today ? ?Acute on chronic combined systolic and diastolic CHF, NYHA class 2 ([place]) ?Developed respiratory failure in part due to fluid overload.  Net negative 4.8L on admission, completed IV diuresis and back on torsemide.  Appears euvolemic now. ?- Continue Coreg, Entresto, torsemide ? ?COPD (chronic obstructive pulmonary disease) ([place]) ?FEV1 46% at baseline in 2015, severe disease.   ?-Continue bronchodilators as needed ? ?BPH (benign prostatic hyperplasia) ?- Resume Flomax and finasteride ? ? ? ? ? ? ? ? ? ?Subjective: Patient has no complaints, no headache, chest pain, dyspnea, confusion, fever, sputum.  He feels overall weak and tired, but was in the chair for some time yesterday. ? ? ? ? ?Physical Exam: ?Vitals:  ? 09/16/21 2348 09/17/21 0328 09/17/21 0800 09/17/21 1130  ?BP: (!) 97/56 (!) 105/54 115/65 (!) 110/45  ?Pulse: 67 68 80 61  ?Resp: 20 16 18 20   ?Temp: 98.4 ?F (36.9 ?C) 98.1 ?F (36.7 ?C)  97.8 ?F (36.6 ?C)  ?TempSrc: Oral Oral  Oral  ?SpO2: 93% 92% 95% 96%  ?Weight:      ?Height:      ? ?Adult male, appears weak and tired, lying in bed, no acute distress ?RRR, no murmurs, no peripheral edema, relatively weak and tired ?Respiratory effort normal, lungs clear without rales or wheezes, respiratory effort shallow ?Abdomen soft without tenderness palpation or guarding, no ascites or distention ?Attention normal, affect blunted, judgment insight appear normal, he is oriented to person,  place, and situation, very tired, effort provided and answering questions and following commands is limited ? ? ? ? ?Data Reviewed: ?Palliative care notes reviewed, speech therapy notes reviewed, nursing notes reviewed, vital signs reviewed ?Labs notable for hemoglobin 10, no change, patient metabolic panel  normal 3 days ago ? ? ? ? ?Disposition: ?Status is: Inpatient ?The patient was admitted with gram-negative rod bacteremia and developed septic shock.  He had multiple organ failure, but is improving, and is ready for rehabilitation when a safe disposition is found ? ? ? ? ? ? ? ?Author: ?Edwin Dada, MD ?09/17/2021 1:19 PM ? ?For on call review [email or web address].  ? ? ?

## 2021-09-17 NOTE — Assessment & Plan Note (Signed)
Completed 10 days antibiotics.  Now resolved. ?

## 2021-09-17 NOTE — Assessment & Plan Note (Addendum)
Developed respiratory failure in part due to fluid overload.  Net negative >7L on admission, completed IV diuresis and back on torsemide.  Appears euvolemic now. - Continue Coreg, Entresto, torsemide

## 2021-09-17 NOTE — Assessment & Plan Note (Addendum)
Continue Flomax and finasteride 

## 2021-09-17 NOTE — Assessment & Plan Note (Addendum)
Not POA - WOC - Air mattress

## 2021-09-17 NOTE — Assessment & Plan Note (Signed)
FEV1 46% at baseline in 2015, severe disease.   ?-Continue bronchodilators as needed ?

## 2021-09-17 NOTE — Consult Note (Addendum)
WOC Nurse Consult Note: ?Patient receiving care in [place] ?Reason for Consult: new pressure ulcer on anus ?Wound type: Small open wound at the tip of the anus, 100% pink approx 0.5 x 0.5 ?ICD-10 CM Codes for Irritant Dermatitis ?X21L8 - Due to fecal, urinary or dual incontinence ?Pressure Injury POA: NA ?Drainage (amount, consistency, odor) None ?Periwound: intact ?Dressing procedure/placement/frequency: ?Clean the area at the anus with soap and water or no rinse cleanser, pat dry and apply a coat of triple paste over the anus area to block out additional moisture. Apply twice daily or PRN soiling. ? ?Monitor the wound area(s) for worsening of condition such as: ?Signs/symptoms of infection, increase in size, development of or worsening of odor, ?development of pain, or increased pain at the affected locations.   ?Notify the medical team if any of these develop. ? ?Thank you for the consult. [place] nurse will not follow at this time.   ?Please re-consult the [place] team if needed. ? ?Cathlean [place]se[place]. Tamala Julian, MSN, RN, CMSRN, AGCNS, WTA ?Wound Treatment Associate ?Pager [phone]   ? ? ?  ?

## 2021-09-17 NOTE — Assessment & Plan Note (Addendum)
At baseline, patient has advanced COPD and CHF, uses 2L at home PRN and at night.  After admission, the patient developed septic shock with respiratory failure requiring intubation.  This is now resolved and he is weaned back to his home oxygen. - Continue pulmonary toilet - Continue torsemide

## 2021-09-17 NOTE — Assessment & Plan Note (Addendum)
Glucose normal - Continue glargine - Continue SS corrections - Hold SGLT2i

## 2021-09-17 NOTE — Progress Notes (Signed)
Inpatient Rehab Admissions Coordinator:  ? ?Met with patient at bedside to discuss CIR goals/expectations.  Reviewed 3 hrs/day of therapy, physician following on rehab, and average length of stay 2 weeks (dependent on progress) with goals of supervision to min assist.  I reviewed the differences between SNF level and CIR level rehab and explained that 3 hrs/day of therapy is not consecutive hours.  He is agreeable to CIR.   ? ?I also spoke to his daughter, [name], on the phone to explain above and discuss caregiver support and expectations.  We discussed that since she last spoke to an [facility], his hospital course has been complicated by prolonged intubation, PEA arrest, etc, and that, while he doesn't appear confused, he does demonstrate some processing delays.  It's difficult to predict whether he will return to mod I level in 2 weeks, 2 months, or ever.  We discussed planning for supervision to min assist level at d/c from CIR, which would require someone to be there 24/7.  Note [facility] with [facility], and [name] has questions regarding clarification of hours available from them.  She is going to discuss with pt and family today but feel that they would all be leaning towards CIR.  I will f/u with her tomorrow morning.  ? ?Shann Medal, PT, DPT ?Admissions Coordinator ?[phone] ?09/17/21  ?1:57 PM ? ?

## 2021-09-17 NOTE — Assessment & Plan Note (Addendum)
Cr stable 

## 2021-09-17 NOTE — Assessment & Plan Note (Signed)
-   Continue phenbobarbital ?

## 2021-09-17 NOTE — Assessment & Plan Note (Signed)
Baseline Cr 1.3-1.6, CKD IIIb, but here had renal failure up to 3.7 due to septic shock.  Now resolved. ?

## 2021-09-17 NOTE — Assessment & Plan Note (Signed)
On apixaban from outpatient [place] Cardiologist, known interaction with Luminal. ?- Continue apixaban and amiodarone and Coreg ?

## 2021-09-17 NOTE — Assessment & Plan Note (Addendum)
Due to proteus bacteremia.  Despite timely antibiotics, patient developed septic shock with respiratory failure, renal failure, and required pressors for 48 hours.  Now resolved.  Completed 10 days antibiotics.

## 2021-09-17 NOTE — Progress Notes (Signed)
Daily Progress Note   Patient Name: Ricky Hayden       Date: 09/17/2021 DOB: Aug 04, 1945  Age: 76 y.o. MRN#: [id number] Attending Physician: Edwin Dada, * Primary Care Physician: [facility] Admit Date: 08/28/2021  Reason for Consultation/Follow-up: Establishing goals of care  Subjective: AM: Medical records reviewed. Patient assessed at the bedside. He denies acute concerns, tells me he is feeling better than during our conversation yesterday. No family present during my visit.   I spoke with patient's daughter [name] by phone and we discussed the difference between palliative care and hospice, reviewed my initial conversation with [name]. She shared patient's cognition is still fluctuating and he may seem to understand more than he actually does.  His mood greatly impacts his ability to participate in his care and remain motivated.  His goal is to return home and be with his dog.  He is described as "quickwitted and funny" when he is truly himself.  We discussed prior hospitalizations including at [facility] in December 2022 for 2 weeks and during [place] 2 years ago.  His ability to "rebound" is notably slower each time he has a major hospitalization.  Before this admission, he was driving and caring for himself with family checking in regularly.  Family has had extensive goals of care conversations throughout his illness, including before extubation when tracheostomy was being considered.  She understands that his debility after mechanical ventilation may affect his overall prognosis and notes that he still has the will to live.  We discussed the benefits of CIR to help patient reach his goal of returning home independently and that patient may have simply been hesitant because of  the pain in the moment.  PM: I then met with [name] again and created space and opportunity for discussion of his current illness, care preferences.  He shares that he spoke with his daughter about CIR and he would be willing to try this.  He is very happy to speak about his dog and share photos.  Explored patient's thoughts on CODE STATUS and when asked about CIR and ventilation, he states "I am not going to do that again."  I called patient's daughter [name] to review patient's statements on CODE STATUS and she wonders whether this is another instance of hesitancy due to current struggles in his recovery.  She agrees it would be helpful to discuss this important topic in person and appreciates the availability of PMT for support.  [name] will call family to arrange a preferred time and inform PMT.  Questions and concerns addressed. PMT will continue to support holistically.   Length of Stay: 19  Current Medications: Scheduled Meds:  . amiodarone  200 mg Oral Daily  . apixaban  5 mg Oral BID  . carvedilol  6.25 mg Oral BID WC  . Chlorhexidine Gluconate Cloth  6 each Topical Daily  . feeding supplement  237 mL Oral BID BM  . insulin aspart  0-20 Units Subcutaneous TID WC  . insulin glargine-yfgn  15 Units Subcutaneous Daily  . ipratropium-albuterol  3 mL Nebulization BID  . melatonin  3 mg Oral QHS  . pantoprazole  40 mg Oral Daily  . PHENobarbital  64.8 mg Oral BID  . pravastatin  40 mg Oral QPM  . sacubitril-valsartan  1 tablet Oral BID  . torsemide  20 mg Oral BID    Continuous Infusions: . sodium chloride Stopped (09/11/21 0110)    PRN Meds: acetaminophen **OR** acetaminophen, albuterol, metoprolol tartrate, senna, white petrolatum  Physical Exam Vitals and nursing note reviewed.  Constitutional:      General: He is not in acute distress.    Appearance: He is obese.     Interventions: Nasal cannula in place.  Cardiovascular:     Rate and Rhythm: Normal rate.  Pulmonary:      Effort: Pulmonary effort is normal. No respiratory distress.  Neurological:     Mental Status: He is alert.  Psychiatric:        Mood and Affect: Mood normal.        Behavior: Behavior is cooperative.            Vital Signs: BP 115/65   Pulse 80   Temp 98.1 F (36.7 C) (Oral)   Resp 18   Ht $R'5\' 7"'ln$  (1.702 m)   Wt 103.9 kg   SpO2 95%   BMI 35.88 kg/m  SpO2: SpO2: 95 % O2 Device: O2 Device: Nasal Cannula O2 Flow Rate: O2 Flow Rate (L/min): 2 L/min  Intake/output summary:  Intake/Output Summary (Last 24 hours) at 09/17/2021 1101 Last data filed at 09/16/2021 2118 Gross per 24 hour  Intake --  Output 1400 ml  Net -1400 ml   LBM: Last BM Date : 09/15/21 Baseline Weight: Weight: 107.2 kg Most recent weight: Weight: 103.9 kg       Palliative Assessment/Data: 50%      Patient Active Problem List   Diagnosis Date Noted  . Paroxysmal atrial fibrillation ([place]) 09/17/2021  . History of craniotomy 09/17/2021  . Seizure disorder ([place]) 09/17/2021  . Normocytic anemia 09/17/2021  . Goals of care, counseling/discussion   . Type 2 diabetes mellitus with chronic kidney disease, without long-term current use of insulin ([place]) 09/14/2021  . Bacteremia due to Gram-negative bacteria   . Complicated UTI (urinary tract infection)   . Acute respiratory failure with hypoxia ([place])   . Septic shock ([place])   . Proteus (mirabilis) (morganii) as the cause of diseases classified elsewhere 09/02/2021  . ARF (acute renal failure) ([place]) 08/29/2021  . Presence of biventricular cardiac pacemaker 05/15/2020  . PVC (premature ventricular contraction) 12/11/2019  . NICM (nonischemic cardiomyopathy) ([place]) 12/11/2019  . Stage 3b chronic kidney disease (CKD) ([place]) 08/12/2019  . Hyperkalemia   . Chronic combined systolic and diastolic CHF, NYHA class 2 ([place]) 06/10/2015  .  Leg swelling 07/16/2014  . Preventative health care 03/07/2014  . COPD (chronic obstructive pulmonary disease) ([place]) 09/11/2013   . BPPV (benign paroxysmal positional vertigo) 10/05/2012  . BPH (benign prostatic hyperplasia) 01/10/2008  . Gout 04/22/2006  . ERECTILE DYSFUNCTION 04/22/2006  . 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 Hyperlipidemia 03/15/2006  . Essential hypertension 03/15/2006  . GERD 03/15/2006  . HIATAL HERNIA 03/15/2006  . OSTEOARTHRITIS 03/15/2006  . Traumatic brain injury [facility]) 03/15/2006    Palliative Care Assessment & Plan   Patient Profile: 76 y.o. 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   Patient was admitted for sepsis from UTI and required BiPAP in the ED. Patient was weaned off but then on 5/2 had decreased level of consciousness and increased oxygen requirement, placed back on BiPAP, became hypotensive and intubated for worsened mental status. Patient was extubated on 5/12. PMT has been consulted to assist with goals of care conversation.    Assessment: Goals of care conversation Bacteremia, resolved Acute respiratory failure s/p extubation, improving ARF, resolved Acute on chronic CHF, improved COPD Pressure injury of sacrum, stage 2  Recommendations/Plan: Full code - patient's daughter will discuss with family and arrange time/date for meeting with PMT to discuss potential change to DNR Full scope treatment Patient is more amendable to CIR today after encouragement from his daughter and improvement of pain from yesterday Goal is to return home and regain independence/prior baseline of functioning Patient and family are agreeable to outpatient palliative care referral upon discharge Psychosocial and emotional support provided PMT will continue to follow and support   Prognosis:  Unable to determine  Discharge Planning: Likely CIR  Care plan was discussed with Patient, patient's  daughter [name]    MDM: [facility]  [facility] Palliative Medicine Team Team phone # [phone]  Thank you for allowing the Palliative Medicine Team to assist in the care of this patient. Please utilize secure chat with additional questions, if there is no response within 30 minutes please call the above phone number.  Palliative Medicine Team providers are available by phone from 7am to 7pm daily and can be reached through the team cell phone.  Should this patient require assistance outside of these hours, please call the patient's attending physician.

## 2021-09-17 NOTE — Assessment & Plan Note (Signed)
See above

## 2021-09-17 NOTE — Assessment & Plan Note (Signed)
Hemoglobin stable today, no clinical bleeding ?

## 2021-09-17 NOTE — Progress Notes (Signed)
Physical Therapy Treatment ?Patient Details ?Name: Ricky Hayden ?MRN: [id number] ?DOB: 04-May-1946 ?Today's Date: 09/17/2021 ? ? ?History of Present Illness Pt is a 76 y.o. male admitted 08/28/21 with fever, chills, SOB. Workup for sepsis secondary to nonobstructing nephrolithiasis/pyelonephritis and concurrent UTI, acute hypoxic respiratory failure secondary to volume overload, AKI on CKD. Course complicated by decline in respiratory status 5/3; ETT 5/3-5/12. PMH includes CHF, ICD, CKF, afib, COPD, seizures. ?  ?PT Comments  ? ? Pt progressing with mobility. Today's session focused on transfer training and standing activity tolerance; pt requiring modA+2 for standing with stedy frame. 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 Pt remains limited by generalized weakness, decreased activity tolerance, poor balance strategies/postural reactions and impaired cognition. Continue to recommend intensive CIR-level therapies to maximize functional mobility and independence prior to return home. ?   ?Recommendations for follow up therapy are one component of a multi-disciplinary discharge planning process, led by the attending physician.  Recommendations may be updated based on patient status, additional functional criteria and insurance authorization. ? ?Follow Up Recommendations ? Acute inpatient rehab (3hours/day) ?  ?  ?Assistance Recommended at Discharge Frequent or constant Supervision/Assistance  ?Patient can return home with the following Two people to help with walking and/or transfers;A lot of help with bathing/dressing/bathroom;Assistance with cooking/housework;Assist for transportation;Help with stairs or ramp for entrance ?  ?Equipment Recommendations ? Rolling walker (2 wheels);BSC/3in1;Wheelchair (measurements PT);Wheelchair cushion (measurements PT) (TBD)   ?  ?Recommendations for Other Services   ? ? ?  ?Precautions / Restrictions Precautions ?Precautions: Fall;Other (comment) ?Precaution Comments: Watch SpO2 (baseline wears overnight?) ?Restrictions ?Weight Bearing Restrictions: No  ?  ? ?Mobility ? Bed Mobility ?Overal bed mobility: Needs Assistance ?Bed Mobility: Supine to Sit ?  ?  ?Supine to sit: Mod assist, +2 for physical assistance ?  ?  ?General bed mobility comments: ModA+1-2 for BLE management and trnk elevation, cues to use bed rail as pt attempting to pull on PT, increased time and effort with repeated verbal cues to attend to and complete task ?  ? ?Transfers ?Overall transfer level: Needs assistance ?Equipment used: Ambulation equipment used ?Transfers: Sit to/from Stand, Bed to chair/wheelchair/BSC ?Sit to Stand: Mod assist, +2 physical assistance ?  ?  ?  ?  ?  ?General transfer comment: Performed additional stand from EOB into stedy frame, then multiple sit<>stands from stedy seat, consistent modA+2 for trunk elevation, repeated verbal cues for sequencing; pt with most difficulty initiating anterior weight translation, but once buttocks cleared from bed/seat, improved ability to extend hips/trunk to achieve upright; mod-maxA for eccentric lower into recliner ?Transfer via Lift Equipment: Stedy ? ?Ambulation/Gait ?  ?  ?  ?  ?  ?  ?Pre-gait activities: weight shifts in stedy frame ?  ? ? ?Stairs ?  ?  ?  ?  ?  ? ? ?Wheelchair Mobility ?  ? ?Modified Rankin (Stroke Patients Only) ?  ? ? ?  ?Balance Overall balance assessment: Needs assistance ?Sitting-balance support: Feet supported, Bilateral upper extremity supported, No upper extremity supported ?Sitting balance-Leahy Scale: Fair ?Sitting balance - Comments: min guard for static sitting ?  ?Standing balance support: During functional activity, Bilateral upper extremity supported ?Standing balance-Leahy Scale: Poor ?Standing balance comment: reliant on BUE support for static standing in frame (did  not require knees blocked, no instability noted with standing and initiating  weight shifts) ?  ?  ?  ?  ?  ?  ?  ?  ?  ?  ?  ?  ? ?  ?Cognition Arousal/Alertness: Awake/alert ?Behavior During Therapy: Flat affect ?Overall Cognitive Status: No family/caregiver present to determine baseline cognitive functioning ?Area of Impairment: Attention, Memory, Following commands, Safety/judgement, Awareness, Problem solving, Orientation ?  ?  ?  ?  ?  ?  ?  ?  ?Orientation Level: Disoriented to, Time, Situation ?Current Attention Level: Sustained ?Memory: Decreased short-term memory ?Following Commands: Follows one step commands inconsistently, Follows one step commands with increased time ?Safety/Judgement: Decreased awareness of safety, Decreased awareness of deficits ?Awareness: Intellectual, Emergent ?Problem Solving: Slow processing, Difficulty sequencing, Requires verbal cues, Requires tactile cues, Decreased initiation ?General Comments: pt seemingly joking appropriately, but suspect he may be using humor to mask cognitive impairment - difficult to determine extent of this versus fatigue. apparent slowed processing, motor planning deficits(?). difficult to determine if pt truly recalls details of yesterday's PT session since he is not forthcoming with words, but acts as if he remembers ?  ?  ? ?  ?Exercises   ? ?  ?General Comments General comments (skin integrity, edema, etc.): SpO2 down to 88% on RA; O2 [place] replaced at end of session. pt reports voice still feels hoarse, but able to sing along some with music during session ?  ?  ? ?Pertinent Vitals/Pain Pain Assessment ?Pain Assessment: Faces ?Faces Pain Scale: Hurts a little bit ?Pain Location: back with repositioning in recliner ?Pain Descriptors / Indicators: Grimacing ?Pain Intervention(s): Monitored during session, Repositioned  ? ? ?Home Living   ?  ?  ?  ?  ?  ?  ?  ?  ?  ?   ?  ?Prior Function    ?  ?  ?   ? ?PT Goals (current goals can now be found in the  care plan section) Progress towards PT goals: Progressing toward goals ? ?  ?Frequency ? ? ? Min 3X/week ? ? ? ?  ?PT Plan Current plan remains appropriate  ? ? ?Co-evaluation   ?  ?  ?  ?  ? ?  ?AM-PAC PT "6 Clicks" Mobility   ?Outcome Measure ? Help needed turning from your back to your side while in a flat bed without using bedrails?: A Lot ?Help needed moving from lying on your back to sitting on the side of a flat bed without using bedrails?: A Lot ?Help needed moving to and from a bed to a chair (including a wheelchair)?: Total ?Help needed standing up from a chair using your arms (e.g., wheelchair or bedside chair)?: Total ?Help needed to walk in hospital room?: Total ?Help needed climbing 3-5 steps with a railing? : Total ?6 Click Score: 8 ? ?  ?End of Session Equipment Utilized During Treatment: Gait belt ?Activity Tolerance: Patient tolerated treatment well ?Patient left: in chair;with call bell/phone within reach;with chair alarm set ?Nurse Communication: Mobility status;Need for lift equipment ?PT Visit Diagnosis: Muscle weakness (generalized) (M62.81);Difficulty in walking, not elsewhere classified (R26.2);Unsteadiness on feet (R26.81) ?  ? ? ?Time: 5681-2751 ?PT Time Calculation (min) (ACUTE ONLY): 26 min ? ?Charges:  $Therapeutic Exercise: 8-22 mins ?$Therapeutic Activity: 8-22 mins          ?          ? ?Mabeline Caras, PT, DPT ?Acute Rehabilitation Services  ?Pager [phone] ?Office [phone] ? ?Derry Lory ?09/17/2021, 5:26 PM ? ?

## 2021-09-18 DIAGNOSIS — I469 Cardiac arrest, cause unspecified: Secondary | ICD-10-CM

## 2021-09-18 DIAGNOSIS — R410 Disorientation, unspecified: Secondary | ICD-10-CM

## 2021-09-18 DIAGNOSIS — A419 Sepsis, unspecified organism: Secondary | ICD-10-CM | POA: Diagnosis not present

## 2021-09-18 DIAGNOSIS — N200 Calculus of kidney: Secondary | ICD-10-CM

## 2021-09-18 DIAGNOSIS — N179 Acute kidney failure, unspecified: Secondary | ICD-10-CM | POA: Diagnosis not present

## 2021-09-18 DIAGNOSIS — I5043 Acute on chronic combined systolic (congestive) and diastolic (congestive) heart failure: Secondary | ICD-10-CM | POA: Diagnosis not present

## 2021-09-18 DIAGNOSIS — J9601 Acute respiratory failure with hypoxia: Secondary | ICD-10-CM | POA: Diagnosis not present

## 2021-09-18 LAB — BASIC METABOLIC PANEL
Anion gap: 5 (ref 5–15)
BUN: 51 mg/dL — ABNORMAL HIGH (ref 8–23)
CO2: 29 mmol/L (ref 22–32)
Calcium: 8.8 mg/dL — ABNORMAL LOW (ref 8.9–10.3)
Chloride: 107 mmol/L (ref 98–111)
Creatinine, Ser: 1.39 mg/dL — ABNORMAL HIGH (ref 0.61–1.24)
GFR, Estimated: 52 mL/min — ABNORMAL LOW (ref >60)
Glucose, Bld: 132 mg/dL — ABNORMAL HIGH (ref 70–99)
Potassium: 4 mmol/L (ref 3.5–5.1)
Sodium: 141 mmol/L (ref 135–145)

## 2021-09-18 LAB — GLUCOSE, CAPILLARY
Glucose-Capillary: 107 mg/dL — ABNORMAL HIGH (ref 70–99)
Glucose-Capillary: 138 mg/dL — ABNORMAL HIGH (ref 70–99)
Glucose-Capillary: 117 mg/dL — ABNORMAL HIGH (ref 70–99)
Glucose-Capillary: 159 mg/dL — ABNORMAL HIGH (ref 70–99)

## 2021-09-18 LAB — CBC
HCT: 31.9 % — ABNORMAL LOW (ref 39.0–52.0)
Hemoglobin: 10.4 g/dL — ABNORMAL LOW (ref 13.0–17.0)
MCH: 31 pg (ref 26.0–34.0)
MCHC: 32.6 g/dL (ref 30.0–36.0)
MCV: 95.2 fL (ref 80.0–100.0)
Platelets: 227 10*3/uL (ref 150–400)
RBC: 3.35 MIL/uL — ABNORMAL LOW (ref 4.22–5.81)
RDW: 13.2 % (ref 11.5–15.5)
WBC: 7.9 10*3/uL (ref 4.0–10.5)
nRBC: 0 % (ref 0.0–0.2)

## 2021-09-18 MED ORDER — TRAMADOL HCL 50 MG PO TABS: 50.0000 mg | ORAL_TABLET | Freq: Four times a day (QID) | ORAL | Status: DC | PRN

## 2021-09-18 NOTE — Assessment & Plan Note (Signed)
Patient had brief PEA arrest after intubation.  ROSC was apparently quick.

## 2021-09-18 NOTE — Progress Notes (Signed)
Progress Note   Patient: Ricky Hayden HFW:[id number] DOB: 07-20-1945 DOA: 08/28/2021     20 DOS: the patient was seen and examined on 09/18/2021 at 12:20PM      Brief hospital course: Ricky Hayden is a 76 y.o. M with chronic systolic HF, Afib, chronic kidney disease and COPD not on home O2 and hx ICH s/p evacuation 1995 who presented with hematuria then fever and SOB.  In the ER, required BiPAP.  CT showed kidney stones without hydro.  Urology consulted, no stenting needed.  Admitted and blood cultures with Proteus.  ID on board.  On 5/2 developed increased O2 requirements, CCM consulted and transferred to ICU.  5/12, extubated after diuresing. 5/13, transferred to medical floor. 5/14-5/17, stabilizing.     Assessment and Plan: * Septic shock ([place]) Due to proteus bacteremia.  Despite timely antibiotics, patient developed septic shock with respiratory failure, renal failure, and required pressors for 48 hours.  Now resolved.  Completed 10 days antibiotics.  Bacteremia due to Gram-negative bacteria Completed 10 days antibiotics.  Now resolved.  Acute on chronic respiratory failure with hypoxia and hypercapnia (HCC) At baseline, patient has advanced COPD and CHF, uses 2L at home PRN and at night.  After admission, the patient developed septic shock with respiratory failure requiring intubation.  This is now resolved and he is weaned back to his home oxygen. - Continue pulmonary toilet - Continue torsemide    Nephrolithiasis Seen by Urology Dr. Thalia Hayden early in hospital stay - Will need outpatient f/u for cysto and likely contrasted imaging study  Morbid obesity ([place]) BMI 35.9 with HTN, DM  Delirium Patient with waxing and waning disorientation here.   Delirium precautions:   -Lights and TV off, minimize interruptions at night  -Blinds open and lights on during day  -Glasses/hearing aid with patient  -Frequent reorientation  -PT/OT when able  -Avoid sedation  medications/Beers list medications    Cardiac arrest [facility]) Patient had brief PEA arrest after intubation.  ROSC was apparently quick.  Pressure injury of sacral region, stage 2 (HCC) Not POA - WOC - Air mattress  Normocytic anemia Hemoglobin stable today, no clinical bleeding  Seizure disorder ([place]) - Continue phenbobarbital  History of craniotomy With history of seizures - Continue phenobarbital  Paroxysmal atrial fibrillation (HCC) On apixaban from outpatient [place] [facility], known interaction with Luminal. - Continue apixaban and amiodarone and Coreg  Type 2 diabetes mellitus with chronic kidney disease, without long-term current use of insulin (HCC) Glucose normal - Continue glargine - Continue SS corrections - Hold [id number]  Complicated UTI (urinary tract infection)    Proteus (mirabilis) (morganii) as the cause of diseases classified elsewhere See above  ARF (acute renal failure) (HCC) Baseline Cr 1.3-1.6, CKD IIIb, but here had renal failure up to 3.7 due to septic shock.  Now resolved.  NICM (nonischemic cardiomyopathy) ([place])    Stage 3b chronic kidney disease (CKD) (HCC) Cr stable  Acute on chronic combined systolic and diastolic CHF, NYHA class 2 ([place]) Developed respiratory failure in part due to fluid overload.  Net negative >7L on admission, completed IV diuresis and back on torsemide.  Appears euvolemic now. - Continue Coreg, Entresto, torsemide    COPD (chronic obstructive pulmonary disease) (HCC) FEV1 46% at baseline in 2015, severe disease.   -Continue bronchodilators as needed  BPH (benign prostatic hyperplasia) - Continue Flomax and finasteride          Subjective: Patient is somewhat confused, but has no headache, chest pain, swelling, orthopnea.  He has some slight dyspnea today.  No cough or sputum.     Physical Exam: Vitals:   09/18/21 0340 09/18/21 0741 09/18/21 1000 09/18/21 1120  BP: 105/64 102/70 (!) 102/34 (!) 100/53   Pulse: (!) 55 79 64 73  Resp: 18 18  19   Temp: 98.2 F (36.8 C) 98.6 F (37 C)  98.2 F (36.8 C)  TempSrc: Oral Oral  Oral  SpO2: 94% 97% 93% 97%  Weight:      Height:       Adult male, obese, lying in bed, appears sleepy, eating lunch RRR, no murmurs, no peripheral edema, JVD not visible due to body habitus Respiratory effort shallow, lung sounds diminished, no rales or wheezes appreciated Abdomen soft without tenderness palpation or guarding Attention slightly diminished, affect blunted, judgment and insight appear mildly impaired, moves upper extremities with generalized weakness but symmetric strength, face symmetric, speech fluent  Data Reviewed: Nursing notes reviewed, vital signs reviewed Blood count shows hemoglobin 10, no change Sodium potassium normal Creatinine 1.4, no change, glucose normal.     Family Communication: Daughter by phone    Disposition: Status is: Inpatient Remains inpatient appropriate because: Patient was admitted with Proteus bacteremia and developed septic shock multiorgan failure.  He has now improved, stable for discharge to rehabilitation, medically ready when a bed is available        Author: Edwin Dada, MD 09/18/2021 2:42 PM  For on call review [email or web address].

## 2021-09-18 NOTE — Progress Notes (Signed)
Inpatient Rehab Admissions Coordinator:   Spoke to pt's daughter [name] on the phone who states that family was able to speak and she asked that I touched  base with sister [name].  Family is in agreement to pursue CIR.  I have asked for insurance auth process to begin.  I reviewed with [name] expectations for supervision 24/7 at discharge, with the hope that pt will quickly progress to intermittent mod I once in familiar surroundings with a routine.  Will continue to follow for potential admit pending insurance approval and bed availability.    Shann Medal, PT, DPT Admissions Coordinator [phone] 09/18/21  1:25 PM

## 2021-09-18 NOTE — Assessment & Plan Note (Signed)
Patient with waxing and waning disorientation here.   Delirium precautions:   -Lights and TV off, minimize interruptions at night  -Blinds open and lights on during day  -Glasses/hearing aid with patient  -Frequent reorientation  -PT/OT when able  -Avoid sedation medications/Beers list medications

## 2021-09-18 NOTE — PMR Pre-admission (Signed)
PMR Admission Coordinator Pre-Admission Assessment  Patient: Ricky Hayden is an 76 y.o., male MRN: [id number] DOB: 03-15-1946 Height: 5\' 7"  (170.2 cm) Weight: 103.9 kg  Insurance Information HMO:     PPO:      PCP:      IPA:      80/20:      OTHER:  PRIMARY: [place]     Policy#: [id number]      Subscriber: pt CM Name: Rubin Payor      Phone#: [phone]     Fax#: [phone] Pre-Cert#: [id number] on admit      Employer:  Benefits:  Phone #: [phone]     Name:  Eff. Date: 09/25/2017     Deduct: $0      Out of Pocket Max: $0      Life Max:  CIR: 100%      SNF: 100% Outpatient: 100%     Co-Pay:  Home Health: 100%      Co-Pay:  DME: 100%     Co-Pay:  Providers:  SECONDARY: Medicare Part A      Policy#: [id number]     Phone#:   Financial Counselor:       Phone#:   The "Data Collection Information Summary" for patients in Inpatient Rehabilitation Facilities with attached "Privacy Act [place] Records" was provided and verbally reviewed with: Patient and Family  Emergency Contact Information Contact Information     Name Relation Home Work Mobile   [place] Daughter   [phone]   [name] Daughter   [phone]   Erich, Kochan [phone]         Current Medical History  Patient Admitting Diagnosis: debility   History of Present Illness: Pt is a 76 y/o male with PMH of combined systolic/diastolic CHF with ICD, atrial fibrillation, CKD III, COPD, and seizures, who presented to [facility] on 4/27 with worsening chills, fevers, and SOB.  He initially required bipap, tmax 103 in ED with UA showing features concerning for UTI so started on empiric antibiotics.  Cultures grew Proteus, ID consulted.  Pt developed septic shock with worsening respiratory status and fluid overload, transferred to ICU on 5/2 for intubation and had brief PEA arrest with quick ROSC.  Pt was extubated on 5/12.  Antibiotics completed.  Hospital course further complicated by AKI.   Therapy ongoing and recommendations are for CIR.     Patient's medical record from [facility] has been reviewed by the rehabilitation admission coordinator and physician.  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 Sherwood Gambler   Nocturia    Obesity    Pansinusitis    a.  complicated by brain  abscess and bleeding requiring craniotomy in 1995.   PONV (postoperative nausea and vomiting)    Sinusitis    s/p ethmoidectomy and nasal septoplasty   Vertigo    intermitantly    Has the patient had major surgery during 100 days prior to admission? No  Family History   family history includes Heart attack (age of onset: 11) in his father; Heart failure (age of onset: 79) in his mother.  Current Medications  Current Facility-Administered Medications:    0.9 %  sodium chloride infusion, 250 mL, Intravenous, Continuous, Agarwala, Ravi, MD, Stopped at 09/11/21 0110   acetaminophen (TYLENOL) tablet 650 mg, 650 mg, Oral, Q6H PRN, 650 mg at 09/17/21 1156 **OR** acetaminophen (TYLENOL) suppository 650 mg, 650 mg, Rectal, Q6H PRN, Agarwala, Ravi, MD   albuterol (PROVENTIL) (2.5 MG/3ML) 0.083% nebulizer solution 2.5 mg, 2.5 mg, Nebulization, Q2H PRN, Agarwala, Ravi, MD   amiodarone (PACERONE)  tablet 200 mg, 200 mg, Oral, Daily, Agarwala, Ravi, MD, 200 mg at 09/19/21 0856   apixaban (ELIQUIS) tablet 5 mg, 5 mg, Oral, BID, Agarwala, Ravi, MD, 5 mg at 09/19/21 0856   carvedilol (COREG) tablet 6.25 mg, 6.25 mg, Oral, BID WC, Agarwala, Ravi, MD, 6.25 mg at 09/19/21 0856   Chlorhexidine Gluconate Cloth 2 % PADS 6 each, 6 each, Topical, Daily, Agarwala, Ravi, MD, 6 each at 09/19/21 0858   feeding supplement (ENSURE ENLIVE / ENSURE PLUS) liquid 237 mL, 237 mL, Oral, BID BM, Danford, Christopher P, MD, 237 mL at 09/19/21 0858   finasteride (PROSCAR) tablet 2.5 mg, 2.5 mg, Oral, Daily, Danford, Christopher P, MD, 2.5 mg at 09/19/21 0857   insulin aspart (novoLOG) injection 0-20 Units, 0-20 Units, Subcutaneous, TID WC, Agarwala, Ravi, MD, 3 Units at 09/19/21 0624   insulin glargine-yfgn (SEMGLEE) injection 15 Units, 15 Units, Subcutaneous, Daily, Agarwala, Ravi, MD, 15 Units at 09/19/21 0857   melatonin tablet 3 mg, 3 mg, Oral, QHS, Laqueta Jean, MD, 3 mg at 09/18/21 2126   metoprolol tartrate (LOPRESSOR) injection 2.5 mg, 2.5 mg, Intravenous, Q6H PRN, Agarwala, Ravi, MD   mometasone-formoterol (DULERA) 100-5 MCG/ACT inhaler 2 puff, 2 puff, Inhalation, BID, Danford, Suann Larry, MD   multivitamin (RENA-VIT) tablet 1 tablet, 1 tablet, Oral, QHS, Danford, Suann Larry, MD, 1 tablet at 09/18/21 2126   pantoprazole (PROTONIX) EC tablet 40 mg, 40 mg, Oral, Daily, Agarwala, Ravi, MD, 40 mg at 09/19/21 0856   PHENobarbital (LUMINAL) tablet 64.8 mg, 64.8 mg, Oral, BID, Agarwala, Ravi, MD, 64.8 mg at 09/19/21 0856   pravastatin (PRAVACHOL) tablet 40 mg, 40 mg, Oral, QPM, Agarwala, Ravi, MD, 40 mg at 09/18/21 1643   sacubitril-valsartan (ENTRESTO) 24-26 mg per tablet, 1 tablet, Oral, BID, Barb Merino, MD, 1 tablet at 09/19/21 0856   senna (SENOKOT) tablet 8.6 mg, 1 tablet, Oral, QHS PRN, Agarwala, Ravi, MD   tamsulosin (FLOMAX) capsule 0.4 mg, 0.4 mg, Oral, QPC breakfast, Danford, Suann Larry,  MD, 0.4 mg at 09/19/21 0856   torsemide (DEMADEX) tablet 20 mg, 20 mg, Oral, BID, Ghimire, Kuber, MD, 20 mg at 09/19/21 0856   traMADol (ULTRAM) tablet 50 mg, 50 mg, Oral, Q6H PRN, Danford, Suann Larry, MD   white petrolatum (VASELINE) gel, , Topical, PRN, Agarwala, Ravi, MD   Zinc Oxide (TRIPLE PASTE) 12.8 % ointment, , Topical, PRN, Danford, Suann Larry, MD, 1 application. at 09/18/21 2130  Patients Current Diet:  Diet Order             DIET DYS 3 Room service  appropriate? Yes; Fluid consistency: Thin  Diet effective now                   Precautions / Restrictions Precautions Precautions: Fall, Other (comment) Precaution Comments: urine incontinence Restrictions Weight Bearing Restrictions: No   Has the patient had 2 or more falls or a fall with injury in the past year? No  Prior Activity Level Limited Community (1-2x/wk): mod I with rollator, driving  Prior Functional Level Self Care: Did the patient need help bathing, dressing, using the toilet or eating? Independent  Indoor Mobility: Did the patient need assistance with walking from room to room (with or without device)? Independent  Stairs: Did the patient need assistance with internal or external stairs (with or without device)? Independent  Functional Cognition: Did the patient need help planning regular tasks such as shopping or remembering to take medications? Independent  Patient Information Are you of Hispanic, Latino/a,or Spanish origin?: A. No, not of Hispanic, Latino/a, or Spanish origin What is your race?: A. White Do you need or want an interpreter to communicate with a doctor or health care staff?: 0. No  Patient's Response To:  Health Literacy and Transportation Is the patient able to respond to health literacy and transportation needs?: Yes Health Literacy - How often do you need to have someone help you when you read instructions, pamphlets, or other written material from your doctor or  pharmacy?: Never In the past 12 months, has lack of transportation kept you from medical appointments or from getting medications?: No In the past 12 months, has lack of transportation kept you from meetings, work, or from getting things needed for daily living?: No  Development worker, international aid / [place] Devices/Equipment: None Home Equipment: Rollator (4 wheels), Hospital bed, Shower seat  Prior Device Use: Indicate devices/aids used by the patient prior to current illness, exacerbation or injury? Walker  Current Functional Level Cognition  Overall Cognitive Status: No family/caregiver present to determine baseline cognitive functioning Difficult to assess due to: Level of arousal Current Attention Level: Sustained Orientation Level: Oriented to person, Oriented to place, Disoriented to time, Disoriented to situation Following Commands: Follows one step commands inconsistently, Follows one step commands with increased time Safety/Judgement: Decreased awareness of safety, Decreased awareness of deficits General Comments: pt seemingly joking appropriately, but suspect he may be using humor to mask cognitive impairment - difficult to determine extent of this versus fatigue. apparent slowed processing, motor planning deficits(?). difficult to determine if pt truly recalls details of yesterday's PT session since he is not forthcoming with words, but acts as if he remembers    Extremity Assessment (includes Sensation/Coordination)  Upper Extremity Assessment: RUE deficits/detail, LUE deficits/detail RUE Deficits / Details: Edema with chronic numbness to RT D5 since 04/2021. AAROM: WFL. MMT: shoulder 2+/5, elbow: 3-/5, wrist 3-/5, grip: 3+/5 but limited composite grip from edema. RUE Sensation: decreased light touch RUE Coordination: decreased fine motor (Opposition absent to D5) LUE Deficits / Details: Edema on LT greater than RT. Chronic numbness to LT D5 since 04/2021.  AAROM: WFL  limiting shoulder to ~120 due to neck lateral LT flexion blocking further shoulder movement. MMT: Shoulder 2/5, elbow: 2+/5, wrist 2+/5, grip: 3/5 with limited composite grip due to edema. LUE: Shoulder pain with ROM LUE Sensation: decreased light touch LUE Coordination: decreased fine motor, decreased gross motor (Opposition absent to D4 and D5)  Lower Extremity Assessment: Defer to PT evaluation RLE Deficits / Details: AAROM hip/knee flexion (supine) to 90 degrees  with wincing at end ROM x 5 reps; limited hip IR and abduction; ankle DF to neutral with stretch; strength grossly 2+/5 LLE Deficits / Details: AAROM hip/knee flexion (supine) to 100 degrees x 5 reps; limited hip IR and abduction; ankle DF to 10* with stretch; strength grossly 2+/5    ADLs  Overall ADL's : Needs assistance/impaired Eating/Feeding: Moderate assistance, Bed level Eating/Feeding Details (indicate cue type and reason): May benefit from adaptive utensils Grooming: Wash/dry face, Supervision/safety Grooming Details (indicate cue type and reason): Pt able to wash his face sitting EOB when presented with washcloth. Upper Body Bathing: Maximal assistance, Bed level Lower Body Bathing: +2 for physical assistance, Bed level, Total assistance Upper Body Dressing : Maximal assistance, Bed level Lower Body Dressing: Total assistance Lower Body Dressing Details (indicate cue type and reason): total assist for donning gripper socks Toilet Transfer: +2 for physical assistance, Maximal assistance, Stand-pivot Toilet Transfer Details (indicate cue type and reason): simulated stand pivot Toileting- Clothing Manipulation and Hygiene: +2 for physical assistance, Maximal assistance, Sit to/from stand Toileting - Clothing Manipulation Details (indicate cue type and reason): Currently with flexiseal. General ADL Comments: Pt able to transfer to the EOB with max assist +2.  Once on the EOB, he was able to maintain sitting balance during  grooming tasks at min guard assist.  Total +2 for sit to stand intervals of 15 to approximately 45 seconds using bilaterals therapists for support as well as RW.  Total +2 for use of the Stedy secondary to decreased initiation for sit to stand.  Oxygen sats maintained at 92% or greater on room air throughout session.    Mobility  Overal bed mobility: Needs Assistance Bed Mobility: Supine to Sit Rolling: Mod assist, +2 for physical assistance Sidelying to sit: +2 for physical assistance, Max assist Supine to sit: Mod assist, +2 for physical assistance Sit to supine: +2 for physical assistance, Total assist General bed mobility comments: received sitting in recliner (transferred via maximove with nursing)    Transfers  Overall transfer level: Needs assistance Equipment used: Rolling walker (2 wheels) Transfers: Sit to/from Stand Sit to Stand: Mod assist, +2 physical assistance, Max assist Bed to/from chair/wheelchair/BSC transfer type:: Via Lift equipment Stand pivot transfers: Mod assist (had one episode where both knees almost buckled (he twitched)) Transfer via Lift Equipment: [place] transfer comment: Multiple sit<>stands from recliner to RW with modA+2 to maxA+1, physical assist and verbal cues for BUE/BLE placement, assist for eccentric lowering    Ambulation / Gait / Stairs / Wheelchair Mobility  Ambulation/Gait Ambulation/Gait assistance: +2 safety/equipment, Mod assist, Max assist Gait Distance (Feet): 2 Feet Assistive device: Rolling walker (2 wheels) Gait Pattern/deviations: Step-to pattern, Decreased weight shift to left, Shuffle, Antalgic, Trunk flexed General Gait Details: pt able to take 4-5 shufflings steps forward with RW and mod-maxA to maintain upright posture; pt inconsistent with ability to weight shift and maintain trunk extension; difficulty accepting weight onto LLE in order to take complete step with R foot; increasing R lateral lean with fatigue; +2 assist for  chair follow Gait velocity: decreased Pre-gait activities: weight shifts in stedy frame    Posture / Balance Dynamic Sitting Balance Sitting balance - Comments: min guard for static sitting Balance Overall balance assessment: Needs assistance Sitting-balance support: Feet supported, Bilateral upper extremity supported, No upper extremity supported Sitting balance-Leahy Scale: Fair Sitting balance - Comments: min guard for static sitting Standing balance support: During functional activity, Bilateral upper extremity supported Standing balance-Leahy Scale: Poor Standing balance  comment: reliant on BUE support and external assist    Special needs/care consideration Oxygen 2L noc and Diabetic management yes   Previous Home Environment (from acute therapy documentation) Living Arrangements: Alone (adult children stay nights and weekends)  Lives With: Daughter, Son Available Help at Discharge: Family, Available 24 hours/day (daugher available 24/7) Type of Home: [place]: One level Home Access: Stairs to enter Entrance Stairs-Rails: None Technical brewer of Steps: 3 Bathroom Shower/Tub: Gaffer, Chiropodist: Handicapped height Bathroom Accessibility: Yes How Accessible: Accessible via walker Pioneer: No  Discharge Living Setting Plans for Discharge Living Setting: Patient's home Type of Home at Discharge: House Discharge Home Layout: One level Discharge Home Access: Stairs to enter Entrance Stairs-Rails: None Entrance Stairs-Number of Steps: 3 Discharge Bathroom Shower/Tub: Walk-in shower, [place] unit Discharge Bathroom Toilet: Handicapped height Discharge Bathroom Accessibility: Yes How Accessible: Accessible via walker Does the patient have any problems obtaining your medications?: No  Social/Family/Support Systems Patient Roles: Parent (4 adult children) Contact Information: [phone] Anticipated Caregiver:  [name] and [name] (daughters) are primary contact, will rotate between family and [facility] caregivers Anticipated Caregiver's Contact Information: [name] [phone] Ability/Limitations of Caregiver: supervision to min assist Caregiver Availability: 24/7 Discharge Plan Discussed with Primary Caregiver: Yes Is Caregiver In Agreement with Plan?: Yes Does Caregiver/Family have Issues with Lodging/Transportation while Pt is in Rehab?: No  Goals Patient/Family Goal for Rehab: PT/OT supervision to min assist, SLP supervision Expected length of stay: 14-16 days Additional Information: would benefit from neuropsych for coping Pt/Family Agrees to Admission and willing to participate: Yes Program Orientation Provided & Reviewed with Pt/Caregiver Including Roles  & Responsibilities: Yes  Barriers to Discharge: Insurance for SNF coverage, Home environment access/layout, Decreased caregiver support  Barriers to Discharge Comments: family hoping to pull together 24/7 (initially at minimum) between family and [facility] caregivers  Decrease burden of Care through IP rehab admission: no  Possible need for SNF placement upon discharge: not anticipated.  Pt's daughters working to pull together 24/7 support between family and [facility] caregivers, if needed, at discharge.   Patient Condition: I have reviewed medical records from [facility], spoken with CM, and patient and daughter. I met with patient at the bedside and discussed via phone for inpatient rehabilitation assessment.  Patient will benefit from ongoing PT, OT, and SLP, can actively participate in 3 hours of therapy a day 5 days of the week, and can make measurable gains during the admission.  Patient will also benefit from the coordinated team approach during an Inpatient Acute Rehabilitation admission.  The patient will receive intensive therapy as well as Rehabilitation physician, nursing, social worker, and care management interventions.  Due to  bladder management, bowel management, safety, skin/wound care, disease management, medication administration, pain management, and patient education the patient requires 24 hour a day rehabilitation nursing.  The patient is currently mod assist to max assist +2 with mobility and basic ADLs.  Discharge setting and therapy post discharge at home with home health is anticipated.  Patient has agreed to participate in the Acute Inpatient Rehabilitation Program and will admit today.  Preadmission Screen Completed By:  Michel Santee, PT, DPT 09/19/2021 11:21 AM ______________________________________________________________________   Discussed status with Dr. Ranell Patrick on 09/19/21  at 11:21 AM  and received approval for admission today.  Admission Coordinator:  Michel Santee, PT, DPT time 11:21 AM Sudie Grumbling 09/19/21    Assessment/Plan: Diagnosis: Debility secondary to septic shock Does the need for close, 24  hr/day Medical supervision in concert with the patient's rehab needs make it unreasonable for this patient to be served in a less intensive setting? Yes Co-Morbidities requiring supervision/potential complications: arthritis, ascending aorta dilatation, recurrent balanitis, cardiac arhythmia, chronic joint pain Due to bladder management, bowel management, safety, skin/wound care, disease management, medication administration, pain management, and patient education, does the patient require 24 hr/day rehab nursing? Yes Does the patient require coordinated care of a physician, rehab nurse, PT, OT to address physical and functional deficits in the context of the above medical diagnosis(es)? Yes Addressing deficits in the following areas: balance, endurance, locomotion, strength, transferring, bowel/bladder control, bathing, dressing, feeding, grooming, toileting, and psychosocial support Can the patient actively participate in an intensive therapy program of at least 3 hrs of therapy 5 days a week? Yes The  potential for patient to make measurable gains while on inpatient rehab is good Anticipated functional outcomes upon discharge from inpatient rehab: min assist PT, min assist OT, independent SLP Estimated rehab length of stay to reach the above functional goals is: 12-16 days Anticipated discharge destination: Home 10. Overall Rehab/Functional Prognosis: excellent   MD Signature: Leeroy Cha, MD

## 2021-09-18 NOTE — Progress Notes (Signed)
Out of bed  with hoyer lift tolerated well. Bed changed  to air mattress.

## 2021-09-18 NOTE — Progress Notes (Signed)
Speech Language Pathology Treatment: Dysphagia  Patient Details Name: Ricky Hayden MRN: [id number] DOB: 1946/02/05 Today's Date: 09/18/2021 Time: 1300-1310 SLP Time Calculation (min) (ACUTE ONLY): 10 min  Assessment / Plan / Recommendation Clinical Impression  Pt was seen for dysphagia tx targeting diet tolerance.  Pt was encountered awake/alert in bed and was pleasant and cooperative throughout this tx.  RN reported that pt has exhibited limited intake of solids, but that he has been tolerating his current diet without difficulty.  Pt was seen with trials of regular solids and thin liquids via straw sip.  Pt exhibited mildly prolonged mastication of solids with mild oral residue noted on the pt's lingual surface following swallow initiation.  Pt was not sensate to residue, but was able to clear it with a cued liquid wash.  Delayed throat clear noted following 1/2 trials of solids with no clinical s/sx of aspiration observed with thin liquid trials.  Pt was unable to recall compensatory strategies independently and was therefore re-educated.  Recommend continuation of Dysphagia 3 solids and thin liquids with medication administered whole in puree.     HPI HPI: Ricky Hayden is a 76 yo gentleman w/ pertinent PMH of chronic HFrEF with ICD, a-fib, CKD stage IIIb, COPD who presented to [facility] on 5/2 w/ hematuria x 3 weeks. The week prior to admission he was started on Bactrim for urine cultures showing proteus mirabilis. He began having worsening fever and SOB and was brought to [facility] ED on 4/27.  At admission he was initially started on BiPAP for SOB. Fever 103 F. Started on empiric abx. CT abdomen showing nonobstructive nephrolithiasis. Urology consulted with no indication for procedure.  Further work up showing sepsis from UTI w/ pyelonephritis. Blood cultures were positive for proteus mirabilis. On Ancef with ID managing antibiotics.  On 5/2 he began having decreased LOC and increased O2 requirements. Intubated  5/3 with short PEA arrest after intubation. Extubated 5/12.      SLP Plan  Continue with current plan of care      Recommendations for follow up therapy are one component of a multi-disciplinary discharge planning process, led by the attending physician.  Recommendations may be updated based on patient status, additional functional criteria and insurance authorization.    Recommendations  Diet recommendations: Dysphagia 3 (mechanical soft);Thin liquid Liquids provided via: Cup;Straw Medication Administration: Whole meds with puree Supervision: Patient able to self feed;Full supervision/cueing for compensatory strategies Compensations: Slow rate;Small sips/bites;Clear throat intermittently Postural Changes and/or Swallow Maneuvers: Seated upright 90 degrees                Oral Care Recommendations: Oral care BID Follow Up Recommendations: No SLP follow up Assistance recommended at discharge: None SLP Visit Diagnosis: Dysphagia, pharyngeal phase (R13.13) Plan: Continue with current plan of care          Bretta Bang, M.S., [place] Office: [phone]  [place]  09/18/2021, 1:33 PM

## 2021-09-18 NOTE — Consult Note (Signed)
[facility] Nurse Consult Note: Patient receiving care in [place]. In completing this consult I spoke with the patient's primary RN, Charita Reason for Consult: "sacral wound" Wound type: Charita has looked at the patient's buttocks area today, and tells me the only wound present is closer to the anus. Marlou Porch, a member of the [place] team, evaluated the patient for an anal wound yesterday, and entered orders. No new needs identified. [place] nurse will not follow at this time.  Please re-consult the [place] team if needed.  Val Riles, RN, MSN, CWOCN, CNS-BC, pager [phone]

## 2021-09-18 NOTE — Progress Notes (Signed)
Physical Therapy Treatment Patient Details Name: Ricky Hayden MRN: [id number] DOB: Feb 26, 1946 Today's Date: 09/18/2021   History of Present Illness Pt is a 76 y.o. male admitted 08/28/21 with fever, chills, SOB. Workup for sepsis secondary to nonobstructing nephrolithiasis/pyelonephritis and concurrent UTI, acute hypoxic respiratory failure secondary to volume overload, AKI on CKD. Course complicated by decline in respiratory status 5/3; ETT 5/3-5/12. PMH includes CHF, ICD, CKF, afib, COPD, seizures.   PT Comments    Pt progressing with mobility. Today's session focused on sit<>stand training and initiating gait with RW, pt requiring mod-maxA +1-2 for mobility. Pt with persistent fatigue, but willing to participate. Pt remains limited by generalized weakness, decreased activity tolerance, poor balance strategies/postural reactions and impaired cognition. Continue to recommend intensive CIR-level therapies to maximize functional mobility and independence prior to return home.    Recommendations for follow up therapy are one component of a multi-disciplinary discharge planning process, led by the attending physician.  Recommendations may be updated based on patient status, additional functional criteria and insurance authorization.  Follow Up Recommendations  Acute inpatient rehab (3hours/day)     Assistance Recommended at Discharge Frequent or constant Supervision/Assistance  Patient can return home with the following Two people to help with walking and/or transfers;A lot of help with bathing/dressing/bathroom;Assistance with cooking/housework;Assist for transportation;Help with stairs or ramp for entrance   Equipment Recommendations  TBD - Rolling walker (2 wheels);BSC/3in1;Wheelchair (measurements PT)   Recommendations for Other Services       Precautions / Restrictions Precautions Precautions: Fall;Other (comment) Precaution Comments: urine incontinence Restrictions Weight Bearing  Restrictions: No     Mobility  Bed Mobility               General bed mobility comments: received sitting in recliner (transferred via maximove with nursing)    Transfers Overall transfer level: Needs assistance Equipment used: Rolling walker (2 wheels) Transfers: Sit to/from Stand Sit to Stand: Mod assist, +2 physical assistance, Max assist           General transfer comment: Multiple sit<>stands from recliner to RW with modA+2 to maxA+1, physical assist and verbal cues for BUE/BLE placement, assist for eccentric lowering    Ambulation/Gait Ambulation/Gait assistance: +2 safety/equipment, Mod assist, Max assist Gait Distance (Feet): 2 Feet Assistive device: Rolling walker (2 wheels) Gait Pattern/deviations: Step-to pattern, Decreased weight shift to left, Shuffle, Antalgic, Trunk flexed       General Gait Details: pt able to take 4-5 shufflings steps forward with RW and mod-maxA to maintain upright posture; pt inconsistent with ability to weight shift and maintain trunk extension; difficulty accepting weight onto LLE in order to take complete step with R foot; increasing R lateral lean with fatigue; +2 assist for chair follow   Stairs             Wheelchair Mobility    Modified Rankin (Stroke Patients Only)       Balance Overall balance assessment: Needs assistance Sitting-balance support: Feet supported, Bilateral upper extremity supported, No upper extremity supported Sitting balance-Leahy Scale: Fair     Standing balance support: During functional activity, Bilateral upper extremity supported Standing balance-Leahy Scale: Poor Standing balance comment: reliant on BUE support and external assist                            Cognition Arousal/Alertness: Awake/alert Behavior During Therapy: Flat affect Overall Cognitive Status: No family/caregiver present to determine baseline cognitive functioning Area of Impairment: Attention, Memory,  Following commands, Safety/judgement, Awareness, Problem solving, Orientation                 Orientation Level: Disoriented to, Time Current Attention Level: Sustained Memory: Decreased short-term memory Following Commands: Follows one step commands inconsistently, Follows one step commands with increased time Safety/Judgement: Decreased awareness of safety, Decreased awareness of deficits Awareness: Intellectual, Emergent Problem Solving: Slow processing, Difficulty sequencing, Requires verbal cues, Requires tactile cues, Decreased initiation General Comments: pt seemingly joking appropriately, but suspect he may be using humor to mask cognitive impairment - difficult to determine extent of this versus fatigue. apparent slowed processing, motor planning deficits(?). difficult to determine if pt truly recalls details of yesterday's PT session since he is not forthcoming with words, but acts as if he remembers        Exercises Other Exercises Other Exercises: anterior weight shift/foward lean in recliner with BUE support on armrests x5    General Comments General comments (skin integrity, edema, etc.): SpO2 94% on RA. pt enjoying singing along to music during session      Pertinent Vitals/Pain Pain Assessment Pain Assessment: Faces Faces Pain Scale: Hurts little more Pain Location: back/BLEs with repositioning in recliner Pain Descriptors / Indicators: Tightness, Grimacing Pain Intervention(s): Monitored during session, Repositioned    Home Living                          Prior Function            PT Goals (current goals can now be found in the care plan section) Progress towards PT goals: Progressing toward goals    Frequency    Min 3X/week      PT Plan Current plan remains appropriate    Co-evaluation              AM-PAC PT "6 Clicks" Mobility   Outcome Measure  Help needed turning from your back to your side while in a flat bed without  using bedrails?: A Lot Help needed moving from lying on your back to sitting on the side of a flat bed without using bedrails?: A Lot Help needed moving to and from a bed to a chair (including a wheelchair)?: Total Help needed standing up from a chair using your arms (e.g., wheelchair or bedside chair)?: A Lot Help needed to walk in hospital room?: Total Help needed climbing 3-5 steps with a railing? : Total 6 Click Score: 9    End of Session Equipment Utilized During Treatment: Gait belt Activity Tolerance: Patient tolerated treatment well Patient left: in chair;with call bell/phone within reach;with chair alarm set Nurse Communication: Mobility status;Need for lift equipment PT Visit Diagnosis: Muscle weakness (generalized) (M62.81);Difficulty in walking, not elsewhere classified (R26.2);Unsteadiness on feet (R26.81)     Time: 5329-9242 PT Time Calculation (min) (ACUTE ONLY): 29 min  Charges:  $Therapeutic Activity: 23-37 mins                     Mabeline Caras, PT, DPT Acute Rehabilitation Services  Pager [phone] Office [place] 09/18/2021, 5:07 PM

## 2021-09-18 NOTE — Progress Notes (Incomplete)
PMR Admission Coordinator Pre-Admission Assessment  Patient: Ricky Hayden is an 76 y.o., male MRN: [id number] DOB: [birth year] Height: 5\' 7"  (170.2 cm) Weight: 103.9 kg  Insurance Information HMO:     PPO:      PCP:      IPA:      80/20:      OTHER:  PRIMARY: Damon     Policy#: [id number]      Subscriber: pt CM Name: Rubin Payor      Phone#: [phone]     Fax#: [phone] Pre-Cert#: tbd on admit      Employer:  Benefits:  Phone #: [phone]     Name:  Eff. Date: 09/25/2017     Deduct: $0      Out of Pocket Max: $0      Life Max:  CIR: 100%      SNF: 100% Outpatient: 100%     Co-Pay:  Home Health: 100%      Co-Pay:  DME: 100%     Co-Pay:  Providers:  SECONDARY: Medicare Part A      Policy#: [id number]     Phone#:   Financial Counselor:       Phone#:   The "Data Collection Information Summary" for patients in Inpatient Rehabilitation Facilities with attached "Privacy Act [place] Records" was provided and verbally reviewed with: Patient and Family  Emergency Contact Information Contact Information     Name Relation Home Work Mobile   [place] Daughter   [phone]   [name] Daughter   [phone]   Dawid, Dupriest [phone]         Current Medical History  Patient Admitting Diagnosis: debility   History of Present Illness: Pt is a 76 y/o male with PMH of combined systolic/diastolic CHF with ICD, atrial fibrillation, CKD III, COPD, and seizures, who presented to [facility] on 4/27 with worsening chills, fevers, and SOB.  He initially required bipap, tmax 103 in ED with UA showing features concerning for UTI so started on empiric antibiotics.  Cultures grew Proteus, ID consulted.  Pt developed septic shock with worsening respiratory status and fluid overload, transferred to ICU on 5/2 for intubation and had brief PEA arrest with quick ROSC.  Pt was extubated on 5/12.  Antibiotics completed.  Hospital course further complicated by AKI.   Therapy ongoing and recommendations are for CIR.     Patient's medical record from [facility] has been reviewed by the rehabilitation admission coordinator and physician.  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 Sherwood Gambler   Nocturia    Obesity    Pansinusitis    a.  complicated by brain  abscess and bleeding requiring craniotomy in 1995.   PONV (postoperative nausea and vomiting)    Sinusitis    s/p ethmoidectomy and nasal septoplasty   Vertigo    intermitantly    Has the patient had major surgery during 100 days prior to admission? No  Family History   family history includes Heart attack (age of onset: 74) in his father; Heart failure (age of onset: 20) in his mother.  Current Medications  Current Facility-Administered Medications:    0.9 %  sodium chloride infusion, 250 mL, Intravenous, Continuous, Agarwala, Ravi, MD, Stopped at 09/11/21 0110   acetaminophen (TYLENOL) tablet 650 mg, 650 mg, Oral, Q6H PRN, 650 mg at 09/17/21 1156 **OR** acetaminophen (TYLENOL) suppository 650 mg, 650 mg, Rectal, Q6H PRN, Agarwala, Ravi, MD   albuterol (PROVENTIL) (2.5 MG/3ML) 0.083% nebulizer solution 2.5 mg, 2.5 mg, Nebulization, Q2H PRN, Agarwala, Ravi, MD   amiodarone (PACERONE)  tablet 200 mg, 200 mg, Oral, Daily, Agarwala, Ravi, MD, 200 mg at 09/18/21 0942   apixaban (ELIQUIS) tablet 5 mg, 5 mg, Oral, BID, Agarwala, Ravi, MD, 5 mg at 09/18/21 0942   carvedilol (COREG) tablet 6.25 mg, 6.25 mg, Oral, BID WC, Agarwala, Ravi, MD, 6.25 mg at 09/18/21 1134   Chlorhexidine Gluconate Cloth 2 % PADS 6 each, 6 each, Topical, Daily, Agarwala, Ravi, MD, 6 each at 09/18/21 0943   feeding supplement (ENSURE ENLIVE / ENSURE PLUS) liquid 237 mL, 237 mL, Oral, BID BM, Danford, Christopher P, MD, 237 mL at 09/18/21 0943   finasteride (PROSCAR) tablet 2.5 mg, 2.5 mg, Oral, Daily, Danford, Christopher P, MD, 2.5 mg at 09/18/21 0941   insulin aspart (novoLOG) injection 0-20 Units, 0-20 Units, Subcutaneous, TID WC, Agarwala, Ravi, MD, 3 Units at 09/18/21 1134   insulin glargine-yfgn (SEMGLEE) injection 15 Units, 15 Units, Subcutaneous, Daily, Agarwala, Ravi, MD, 15 Units at 09/18/21 0942   melatonin tablet 3 mg, 3 mg, Oral, QHS, Laqueta Jean, MD, 3 mg at 09/17/21 2158   metoprolol tartrate (LOPRESSOR) injection 2.5 mg, 2.5 mg, Intravenous, Q6H PRN, Agarwala, Ravi, MD   multivitamin (RENA-VIT) tablet 1 tablet, 1 tablet, Oral, QHS, Danford, Suann Larry, MD, 1 tablet at 09/17/21 2158   pantoprazole (PROTONIX) EC tablet 40 mg, 40 mg, Oral, Daily, Agarwala, Ravi, MD, 40 mg at 09/18/21 0941   PHENobarbital (LUMINAL) tablet 64.8 mg, 64.8 mg, Oral, BID, Agarwala, Ravi, MD, 64.8 mg at 09/18/21 0941   pravastatin (PRAVACHOL) tablet 40 mg, 40 mg, Oral, QPM, Agarwala, Ravi, MD, 40 mg at 09/17/21 1700   sacubitril-valsartan (ENTRESTO) 24-26 mg per tablet, 1 tablet, Oral, BID, Barb Merino, MD, 1 tablet at 09/18/21 0941   senna (SENOKOT) tablet 8.6 mg, 1 tablet, Oral, QHS PRN, Agarwala, Ravi, MD   tamsulosin (FLOMAX) capsule 0.4 mg, 0.4 mg, Oral, QPC breakfast, Danford, Suann Larry, MD, 0.4 mg at 09/18/21 0942   torsemide (DEMADEX) tablet 20 mg, 20 mg, Oral, BID, Ghimire, Kuber, MD, 20 mg at  09/18/21 0941   traMADol (ULTRAM) tablet 50 mg, 50 mg, Oral, Q6H PRN, Danford, Suann Larry, MD   white petrolatum (VASELINE) gel, , Topical, PRN, Agarwala, Ravi, MD   Zinc Oxide (TRIPLE PASTE) 12.8 % ointment, , Topical, PRN, Danford, Suann Larry, MD, 1 application. at 09/17/21 2206  Patients Current Diet:  Diet Order             DIET DYS 3 Room service appropriate? Yes; Fluid consistency: Thin  Diet effective now  Precautions / Restrictions Precautions Precautions: Fall, Other (comment) Precaution Comments: Watch SpO2 (baseline wears overnight?) Restrictions Weight Bearing Restrictions: No   Has the patient had 2 or more falls or a fall with injury in the past year? No  Prior Activity Level Limited Community (1-2x/wk): mod I with rollator, driving  Prior Functional Level Self Care: Did the patient need help bathing, dressing, using the toilet or eating? Independent  Indoor Mobility: Did the patient need assistance with walking from room to room (with or without device)? Independent  Stairs: Did the patient need assistance with internal or external stairs (with or without device)? Independent  Functional Cognition: Did the patient need help planning regular tasks such as shopping or remembering to take medications? Independent  Patient Information Are you of Hispanic, Latino/a,or Spanish origin?: A. No, not of Hispanic, Latino/a, or Spanish origin What is your race?: A. White Do you need or want an interpreter to communicate with a doctor or health care staff?: 0. No  Patient's Response To:  Health Literacy and Transportation Is the patient able to respond to health literacy and transportation needs?: Yes Health Literacy - How often do you need to have someone help you when you read instructions, pamphlets, or other written material from your doctor or pharmacy?: Never In the past 12 months, has lack of transportation kept you from medical  appointments or from getting medications?: No In the past 12 months, has lack of transportation kept you from meetings, work, or from getting things needed for daily living?: No  Development worker, international aid / [place] Devices/Equipment: None Home Equipment: Rollator (4 wheels), Hospital bed, Shower seat  Prior Device Use: Indicate devices/aids used by the patient prior to current illness, exacerbation or injury? Walker  Current Functional Level Cognition  Overall Cognitive Status: No family/caregiver present to determine baseline cognitive functioning Difficult to assess due to: Level of arousal Current Attention Level: Sustained Orientation Level: Oriented to person, Oriented to place, Disoriented to time, Disoriented to situation Following Commands: Follows one step commands inconsistently, Follows one step commands with increased time Safety/Judgement: Decreased awareness of safety, Decreased awareness of deficits General Comments: pt seemingly joking appropriately, but suspect he may be using humor to mask cognitive impairment - difficult to determine extent of this versus fatigue. apparent slowed processing, motor planning deficits(?). difficult to determine if pt truly recalls details of yesterday's PT session since he is not forthcoming with words, but acts as if he remembers    Extremity Assessment (includes Sensation/Coordination)  Upper Extremity Assessment: RUE deficits/detail, LUE deficits/detail RUE Deficits / Details: Edema with chronic numbness to RT D5 since 04/2021. AAROM: WFL. MMT: shoulder 2+/5, elbow: 3-/5, wrist 3-/5, grip: 3+/5 but limited composite grip from edema. RUE Sensation: decreased light touch RUE Coordination: decreased fine motor (Opposition absent to D5) LUE Deficits / Details: Edema on LT greater than RT. Chronic numbness to LT D5 since 04/2021.  AAROM: WFL limiting shoulder to ~120 due to neck lateral LT flexion blocking further shoulder  movement. MMT: Shoulder 2/5, elbow: 2+/5, wrist 2+/5, grip: 3/5 with limited composite grip due to edema. LUE: Shoulder pain with ROM LUE Sensation: decreased light touch LUE Coordination: decreased fine motor, decreased gross motor (Opposition absent to D4 and D5)  Lower Extremity Assessment: Defer to PT evaluation RLE Deficits / Details: AAROM hip/knee flexion (supine) to 90 degrees with wincing at end ROM x 5 reps; limited hip IR and abduction; ankle DF to neutral with stretch; strength grossly 2+/5 LLE Deficits /  Details: AAROM hip/knee flexion (supine) to 100 degrees x 5 reps; limited hip IR and abduction; ankle DF to 10* with stretch; strength grossly 2+/5    ADLs  Overall ADL's : Needs assistance/impaired Eating/Feeding: Moderate assistance, Bed level Eating/Feeding Details (indicate cue type and reason): May benefit from adaptive utensils Grooming: Wash/dry face, Supervision/safety Grooming Details (indicate cue type and reason): Pt able to wash his face sitting EOB when presented with washcloth. Upper Body Bathing: Maximal assistance, Bed level Lower Body Bathing: +2 for physical assistance, Bed level, Total assistance Upper Body Dressing : Maximal assistance, Bed level Lower Body Dressing: Total assistance Lower Body Dressing Details (indicate cue type and reason): total assist for donning gripper socks Toilet Transfer: +2 for physical assistance, Maximal assistance, Stand-pivot Toilet Transfer Details (indicate cue type and reason): simulated stand pivot Toileting- Clothing Manipulation and Hygiene: +2 for physical assistance, Maximal assistance, Sit to/from stand Toileting - Clothing Manipulation Details (indicate cue type and reason): Currently with flexiseal. General ADL Comments: Pt able to transfer to the EOB with max assist +2.  Once on the EOB, he was able to maintain sitting balance during grooming tasks at min guard assist.  Total +2 for sit to stand intervals of 15 to  approximately 45 seconds using bilaterals therapists for support as well as RW.  Total +2 for use of the Stedy secondary to decreased initiation for sit to stand.  Oxygen sats maintained at 92% or greater on room air throughout session.    Mobility  Overal bed mobility: Needs Assistance Bed Mobility: Supine to Sit Rolling: Mod assist, +2 for physical assistance Sidelying to sit: +2 for physical assistance, Max assist Supine to sit: Mod assist, +2 for physical assistance Sit to supine: +2 for physical assistance, Total assist General bed mobility comments: ModA+1-2 for BLE management and trnk elevation, cues to use bed rail as pt attempting to pull on PT, increased time and effort with repeated verbal cues to attend to and complete task    Transfers  Overall transfer level: Needs assistance Equipment used: Ambulation equipment used Transfers: Sit to/from Stand, Bed to chair/wheelchair/BSC Sit to Stand: Mod assist, +2 physical assistance Bed to/from chair/wheelchair/BSC transfer type:: Via Lift equipment Stand pivot transfers: Mod assist (had one episode where both knees almost buckled (he twitched)) Transfer via Lift Equipment: [place] transfer comment: Performed additional stand from EOB into stedy frame, then multiple sit<>stands from stedy seat, consistent modA+2 for trunk elevation, repeated verbal cues for sequencing; pt with most difficulty initiating anterior weight translation, but once buttocks cleared from bed/seat, improved ability to extend hips/trunk to achieve upright; mod-maxA for eccentric lower into recliner    Ambulation / Gait / Stairs / Wheelchair Mobility  Ambulation/Gait Ambulation/Gait assistance: Herbalist (Feet): 3 Feet Assistive device: Rolling walker (2 wheels) Gait Pattern/deviations: Trunk flexed, Wide base of support General Gait Details: ABle to side step along side bed with Min A for RW management and balance. Difficulty with sequencing.  SP02 dropped to 85% on 4L/min 02 [place]. Gait velocity: decreased Pre-gait activities: weight shifts in stedy frame    Posture / Balance Dynamic Sitting Balance Sitting balance - Comments: min guard for static sitting Balance Overall balance assessment: Needs assistance Sitting-balance support: Feet supported, Bilateral upper extremity supported, No upper extremity supported Sitting balance-Leahy Scale: Fair Sitting balance - Comments: min guard for static sitting Standing balance support: During functional activity, Bilateral upper extremity supported Standing balance-Leahy Scale: Poor Standing balance comment: reliant on BUE support for  static standing in frame (did not require knees blocked, no instability noted with standing and initiating weight shifts)    Special needs/care consideration Oxygen 2L noc and Diabetic management yes   Previous Home Environment (from acute therapy documentation) Living Arrangements: Alone (adult children stay nights and weekends)  Lives With: Daughter, Son Available Help at Discharge: Family, Available 24 hours/day (daugher available 24/7) Type of Home: House Home Layout: One level Home Access: Stairs to enter Entrance Stairs-Rails: None Technical brewer of Steps: 3 Bathroom Shower/Tub: Gaffer, Chiropodist: Handicapped height Bathroom Accessibility: Yes How Accessible: Accessible via walker [place]: No  Discharge Living Setting Plans for Discharge Living Setting: Patient's home Type of Home at Discharge: House Discharge Home Layout: One level Discharge Home Access: Stairs to enter Entrance Stairs-Rails: None Entrance Stairs-Number of Steps: 3 Discharge Bathroom Shower/Tub: Walk-in shower, [place] unit Discharge Bathroom Toilet: Handicapped height Discharge Bathroom Accessibility: Yes How Accessible: Accessible via walker Does the patient have any problems obtaining your medications?:  No  Social/Family/Support Systems Patient Roles: Parent (4 adult children) Contact Information: [phone] Anticipated Caregiver: [name] and [name] (daughters) are primary contact, will rotate between family and [facility] caregivers Anticipated Caregiver's Contact Information: [name] [phone] Ability/Limitations of Caregiver: supervision to min assist Caregiver Availability: 24/7 Discharge Plan Discussed with Primary Caregiver: Yes Is Caregiver In Agreement with Plan?: Yes Does Caregiver/Family have Issues with Lodging/Transportation while Pt is in Rehab?: No  Goals Patient/Family Goal for Rehab: PT/OT supervision to min assist, SLP supervision Expected length of stay: 14-16 days Additional Information: would benefit from neuropsych for coping Pt/Family Agrees to Admission and willing to participate: Yes Program Orientation Provided & Reviewed with Pt/Caregiver Including Roles  & Responsibilities: Yes  Barriers to Discharge: Insurance for SNF coverage, Home environment access/layout, Decreased caregiver support  Barriers to Discharge Comments: family hoping to pull together 24/7 (initially at minimum) between family and [facility] caregivers  Decrease burden of Care through IP rehab admission: no  Possible need for SNF placement upon discharge: not anticipated.  Pt's daughters working to pull together 24/7 support between family and [facility] caregivers, if needed, at discharge.   Patient Condition: I have reviewed medical records from [facility], spoken with CM, and patient and daughter. I met with patient at the bedside and discussed via phone for inpatient rehabilitation assessment.  Patient will benefit from ongoing PT, OT, and SLP, can actively participate in 3 hours of therapy a day 5 days of the week, and can make measurable gains during the admission.  Patient will also benefit from the coordinated team approach during an Inpatient Acute Rehabilitation admission.  The patient  will receive intensive therapy as well as Rehabilitation physician, nursing, social worker, and care management interventions.  Due to bladder management, bowel management, safety, skin/wound care, disease management, medication administration, pain management, and patient education the patient requires 24 hour a day rehabilitation nursing.  The patient is currently mod assist to max assist +2 with mobility and basic ADLs.  Discharge setting and therapy post discharge at home with home health is anticipated.  Patient has agreed to participate in the Acute Inpatient Rehabilitation Program and will admit pending insurance approval ***.  Preadmission Screen Completed By:  Michel Santee, PT, DPT 09/18/2021 3:19 PM

## 2021-09-18 NOTE — Assessment & Plan Note (Addendum)
Seen by Urology Dr. Thalia Bloodgood early in hospital stay - Will need outpatient f/u for cysto and likely contrasted imaging study

## 2021-09-18 NOTE — Assessment & Plan Note (Signed)
BMI 35.9 with HTN, DM

## 2021-09-19 ENCOUNTER — Inpatient Hospital Stay (HOSPITAL_COMMUNITY)
Admission: RE | Admit: 2021-09-19 | Discharge: 2021-09-25 | DRG: 945 | Disposition: A | Discharge status: Other Acute Inpatient Hospital | Payer: No Typology Code available for payment source | Source: Intra-hospital | Attending: Physical Medicine & Rehabilitation | Admitting: Physical Medicine & Rehabilitation

## 2021-09-19 ENCOUNTER — Encounter (HOSPITAL_COMMUNITY): Payer: Self-pay | Admitting: Physical Medicine & Rehabilitation

## 2021-09-19 ENCOUNTER — Other Ambulatory Visit: Payer: Self-pay

## 2021-09-19 DIAGNOSIS — I959 Hypotension, unspecified: Secondary | ICD-10-CM

## 2021-09-19 DIAGNOSIS — Z1321 Encounter for screening for nutritional disorder: Secondary | ICD-10-CM | POA: Diagnosis not present

## 2021-09-19 DIAGNOSIS — I480 Paroxysmal atrial fibrillation: Secondary | ICD-10-CM | POA: Diagnosis not present

## 2021-09-19 DIAGNOSIS — E8809 Other disorders of plasma-protein metabolism, not elsewhere classified: Secondary | ICD-10-CM | POA: Diagnosis present

## 2021-09-19 DIAGNOSIS — N401 Enlarged prostate with lower urinary tract symptoms: Secondary | ICD-10-CM | POA: Diagnosis present

## 2021-09-19 DIAGNOSIS — R000 Tachycardia, unspecified: Secondary | ICD-10-CM | POA: Diagnosis not present

## 2021-09-19 DIAGNOSIS — R338 Other retention of urine: Secondary | ICD-10-CM | POA: Diagnosis present

## 2021-09-19 DIAGNOSIS — Z7951 Long term (current) use of inhaled steroids: Secondary | ICD-10-CM

## 2021-09-19 DIAGNOSIS — Z9981 Dependence on supplemental oxygen: Secondary | ICD-10-CM

## 2021-09-19 DIAGNOSIS — N179 Acute kidney failure, unspecified: Secondary | ICD-10-CM | POA: Diagnosis not present

## 2021-09-19 DIAGNOSIS — E1165 Type 2 diabetes mellitus with hyperglycemia: Secondary | ICD-10-CM | POA: Diagnosis present

## 2021-09-19 DIAGNOSIS — M109 Gout, unspecified: Secondary | ICD-10-CM | POA: Diagnosis present

## 2021-09-19 DIAGNOSIS — I130 Hypertensive heart and chronic kidney disease with heart failure and stage 1 through stage 4 chronic kidney disease, or unspecified chronic kidney disease: Secondary | ICD-10-CM | POA: Diagnosis present

## 2021-09-19 DIAGNOSIS — G40909 Epilepsy, unspecified, not intractable, without status epilepticus: Secondary | ICD-10-CM | POA: Diagnosis present

## 2021-09-19 DIAGNOSIS — Z8616 Personal history of COVID-19: Secondary | ICD-10-CM | POA: Diagnosis not present

## 2021-09-19 DIAGNOSIS — I5043 Acute on chronic combined systolic (congestive) and diastolic (congestive) heart failure: Secondary | ICD-10-CM | POA: Diagnosis not present

## 2021-09-19 DIAGNOSIS — E1169 Type 2 diabetes mellitus with other specified complication: Secondary | ICD-10-CM | POA: Diagnosis not present

## 2021-09-19 DIAGNOSIS — Z7189 Other specified counseling: Secondary | ICD-10-CM

## 2021-09-19 DIAGNOSIS — M1712 Unilateral primary osteoarthritis, left knee: Secondary | ICD-10-CM | POA: Diagnosis present

## 2021-09-19 DIAGNOSIS — K219 Gastro-esophageal reflux disease without esophagitis: Secondary | ICD-10-CM | POA: Diagnosis present

## 2021-09-19 DIAGNOSIS — Z8249 Family history of ischemic heart disease and other diseases of the circulatory system: Secondary | ICD-10-CM

## 2021-09-19 DIAGNOSIS — Z888 Allergy status to other drugs, medicaments and biological substances status: Secondary | ICD-10-CM

## 2021-09-19 DIAGNOSIS — E1122 Type 2 diabetes mellitus with diabetic chronic kidney disease: Secondary | ICD-10-CM | POA: Diagnosis present

## 2021-09-19 DIAGNOSIS — I2510 Atherosclerotic heart disease of native coronary artery without angina pectoris: Secondary | ICD-10-CM | POA: Diagnosis present

## 2021-09-19 DIAGNOSIS — R5381 Other malaise: Secondary | ICD-10-CM | POA: Diagnosis present

## 2021-09-19 DIAGNOSIS — J449 Chronic obstructive pulmonary disease, unspecified: Secondary | ICD-10-CM | POA: Diagnosis present

## 2021-09-19 DIAGNOSIS — I495 Sick sinus syndrome: Secondary | ICD-10-CM | POA: Diagnosis present

## 2021-09-19 DIAGNOSIS — Z87440 Personal history of urinary (tract) infections: Secondary | ICD-10-CM

## 2021-09-19 DIAGNOSIS — J9621 Acute and chronic respiratory failure with hypoxia: Secondary | ICD-10-CM

## 2021-09-19 DIAGNOSIS — I4892 Unspecified atrial flutter: Secondary | ICD-10-CM | POA: Diagnosis not present

## 2021-09-19 DIAGNOSIS — I4819 Other persistent atrial fibrillation: Secondary | ICD-10-CM | POA: Diagnosis not present

## 2021-09-19 DIAGNOSIS — I4891 Unspecified atrial fibrillation: Secondary | ICD-10-CM | POA: Diagnosis present

## 2021-09-19 DIAGNOSIS — I5042 Chronic combined systolic (congestive) and diastolic (congestive) heart failure: Secondary | ICD-10-CM | POA: Diagnosis present

## 2021-09-19 DIAGNOSIS — I5022 Chronic systolic (congestive) heart failure: Secondary | ICD-10-CM | POA: Diagnosis not present

## 2021-09-19 DIAGNOSIS — I4821 Permanent atrial fibrillation: Secondary | ICD-10-CM | POA: Diagnosis not present

## 2021-09-19 DIAGNOSIS — L89892 Pressure ulcer of other site, stage 2: Secondary | ICD-10-CM | POA: Diagnosis present

## 2021-09-19 DIAGNOSIS — I428 Other cardiomyopathies: Secondary | ICD-10-CM | POA: Diagnosis present

## 2021-09-19 DIAGNOSIS — E860 Dehydration: Secondary | ICD-10-CM | POA: Diagnosis not present

## 2021-09-19 DIAGNOSIS — T380X5A Adverse effect of glucocorticoids and synthetic analogues, initial encounter: Secondary | ICD-10-CM | POA: Diagnosis present

## 2021-09-19 DIAGNOSIS — L89316 Pressure-induced deep tissue damage of right buttock: Secondary | ICD-10-CM | POA: Diagnosis present

## 2021-09-19 DIAGNOSIS — Z7901 Long term (current) use of anticoagulants: Secondary | ICD-10-CM

## 2021-09-19 DIAGNOSIS — R410 Disorientation, unspecified: Secondary | ICD-10-CM | POA: Diagnosis not present

## 2021-09-19 DIAGNOSIS — I10 Essential (primary) hypertension: Secondary | ICD-10-CM | POA: Diagnosis not present

## 2021-09-19 DIAGNOSIS — Z8674 Personal history of sudden cardiac arrest: Secondary | ICD-10-CM

## 2021-09-19 DIAGNOSIS — I469 Cardiac arrest, cause unspecified: Secondary | ICD-10-CM

## 2021-09-19 DIAGNOSIS — Z95810 Presence of automatic (implantable) cardiac defibrillator: Secondary | ICD-10-CM

## 2021-09-19 DIAGNOSIS — L89326 Pressure-induced deep tissue damage of left buttock: Secondary | ICD-10-CM | POA: Diagnosis present

## 2021-09-19 DIAGNOSIS — J9622 Acute and chronic respiratory failure with hypercapnia: Secondary | ICD-10-CM

## 2021-09-19 DIAGNOSIS — E785 Hyperlipidemia, unspecified: Secondary | ICD-10-CM | POA: Diagnosis present

## 2021-09-19 DIAGNOSIS — A419 Sepsis, unspecified organism: Secondary | ICD-10-CM | POA: Diagnosis not present

## 2021-09-19 DIAGNOSIS — I447 Left bundle-branch block, unspecified: Secondary | ICD-10-CM | POA: Diagnosis present

## 2021-09-19 DIAGNOSIS — N1832 Chronic kidney disease, stage 3b: Secondary | ICD-10-CM | POA: Diagnosis present

## 2021-09-19 DIAGNOSIS — N183 Chronic kidney disease, stage 3 unspecified: Secondary | ICD-10-CM | POA: Diagnosis present

## 2021-09-19 DIAGNOSIS — Z87442 Personal history of urinary calculi: Secondary | ICD-10-CM

## 2021-09-19 DIAGNOSIS — Z7984 Long term (current) use of oral hypoglycemic drugs: Secondary | ICD-10-CM

## 2021-09-19 DIAGNOSIS — Z515 Encounter for palliative care: Secondary | ICD-10-CM

## 2021-09-19 DIAGNOSIS — Z79899 Other long term (current) drug therapy: Secondary | ICD-10-CM

## 2021-09-19 DIAGNOSIS — R7303 Prediabetes: Secondary | ICD-10-CM | POA: Diagnosis present

## 2021-09-19 DIAGNOSIS — Z87891 Personal history of nicotine dependence: Secondary | ICD-10-CM

## 2021-09-19 LAB — GLUCOSE, CAPILLARY
Glucose-Capillary: 121 mg/dL — ABNORMAL HIGH (ref 70–99)
Glucose-Capillary: 119 mg/dL — ABNORMAL HIGH (ref 70–99)
Glucose-Capillary: 94 mg/dL (ref 70–99)
Glucose-Capillary: 112 mg/dL — ABNORMAL HIGH (ref 70–99)

## 2021-09-19 MED ORDER — TRAMADOL HCL 50 MG PO TABS: 50.0000 mg | ORAL_TABLET | Freq: Four times a day (QID) | ORAL | Status: DC | PRN

## 2021-09-19 MED ORDER — INSULIN ASPART 100 UNIT/ML IJ SOLN: 0.0000 [IU] | Freq: Every day | INTRAMUSCULAR | Status: DC

## 2021-09-19 MED ORDER — PROCHLORPERAZINE 25 MG RE SUPP: 12.5000 mg | Freq: Four times a day (QID) | RECTAL | Status: DC | PRN

## 2021-09-19 MED ORDER — GUAIFENESIN-DM 100-10 MG/5ML PO SYRP
5.0000 mL | ORAL_SOLUTION | Freq: Four times a day (QID) | ORAL | Status: DC | PRN
  Filled 2021-09-19: qty 10

## 2021-09-19 MED ORDER — POLYETHYLENE GLYCOL 3350 17 G PO PACK
17.0000 g | PACK | Freq: Every day | ORAL | Status: DC | PRN
  Administered 2021-09-23 – 2021-09-24 (×2): 17 g via ORAL
  Filled 2021-09-19: qty 1

## 2021-09-19 MED ORDER — TORSEMIDE 20 MG PO TABS
20.0000 mg | ORAL_TABLET | Freq: Two times a day (BID) | ORAL | Status: DC
Start: 2021-09-19 — End: 2021-09-22
  Administered 2021-09-19 – 2021-09-22 (×6): 20 mg via ORAL
  Filled 2021-09-19 (×6): qty 1

## 2021-09-19 MED ORDER — ACETAMINOPHEN 325 MG PO TABS
325.0000 mg | ORAL_TABLET | ORAL | Status: DC | PRN
  Administered 2021-09-21 – 2021-09-24 (×3): 650 mg via ORAL
  Filled 2021-09-19 (×3): qty 2

## 2021-09-19 MED ORDER — INSULIN GLARGINE-YFGN 100 UNIT/ML ~~LOC~~ SOLN: 8.0000 [IU] | Freq: Every day | SUBCUTANEOUS | 11 refills | Status: DC

## 2021-09-19 MED ORDER — APIXABAN 5 MG PO TABS
5.0000 mg | ORAL_TABLET | Freq: Two times a day (BID) | ORAL | Status: DC
  Administered 2021-09-19 – 2021-09-25 (×12): 5 mg via ORAL
  Filled 2021-09-19 (×12): qty 1

## 2021-09-19 MED ORDER — LIDOCAINE HCL URETHRAL/MUCOSAL 2 % EX GEL: CUTANEOUS | Status: DC | PRN

## 2021-09-19 MED ORDER — TORSEMIDE 20 MG PO TABS
20.0000 mg | ORAL_TABLET | Freq: Two times a day (BID) | ORAL | Status: DC
Start: 2021-09-19 — End: 2021-10-10

## 2021-09-19 MED ORDER — DIPHENHYDRAMINE HCL 12.5 MG/5ML PO ELIX: 12.5000 mg | ORAL_SOLUTION | Freq: Four times a day (QID) | ORAL | Status: DC | PRN

## 2021-09-19 MED ORDER — INSULIN GLARGINE-YFGN 100 UNIT/ML ~~LOC~~ SOLN
10.0000 [IU] | Freq: Every day | SUBCUTANEOUS | Status: DC
  Administered 2021-09-20 – 2021-09-22 (×3): 10 [IU] via SUBCUTANEOUS
  Filled 2021-09-19 (×3): qty 0.1

## 2021-09-19 MED ORDER — PROCHLORPERAZINE EDISYLATE 10 MG/2ML IJ SOLN: 5.0000 mg | Freq: Four times a day (QID) | INTRAMUSCULAR | Status: DC | PRN

## 2021-09-19 MED ORDER — ALBUTEROL SULFATE (2.5 MG/3ML) 0.083% IN NEBU: 2.5000 mg | INHALATION_SOLUTION | RESPIRATORY_TRACT | Status: DC | PRN

## 2021-09-19 MED ORDER — TAMSULOSIN HCL 0.4 MG PO CAPS
0.4000 mg | ORAL_CAPSULE | Freq: Every day | ORAL | Status: DC
Start: 2021-09-20 — End: 2021-09-20
  Administered 2021-09-20: 0.4 mg via ORAL
  Filled 2021-09-19: qty 1

## 2021-09-19 MED ORDER — TRAZODONE HCL 50 MG PO TABS: 25.0000 mg | ORAL_TABLET | Freq: Every evening | ORAL | Status: DC | PRN

## 2021-09-19 MED ORDER — ENSURE MAX PROTEIN PO LIQD
11.0000 [oz_av] | Freq: Every day | ORAL | Status: DC
  Administered 2021-09-19 – 2021-09-22 (×4): 11 [oz_av] via ORAL

## 2021-09-19 MED ORDER — EMPAGLIFLOZIN 10 MG PO TABS
10.0000 mg | ORAL_TABLET | Freq: Every day | ORAL | Status: DC
  Administered 2021-09-20 – 2021-09-25 (×6): 10 mg via ORAL
  Filled 2021-09-19 (×6): qty 1

## 2021-09-19 MED ORDER — SACUBITRIL-VALSARTAN 24-26 MG PO TABS
1.0000 | ORAL_TABLET | Freq: Two times a day (BID) | ORAL | Status: DC
  Administered 2021-09-19 – 2021-09-24 (×9): 1 via ORAL
  Filled 2021-09-19 (×10): qty 1

## 2021-09-19 MED ORDER — MELATONIN 3 MG PO TABS
3.0000 mg | ORAL_TABLET | Freq: Every day | ORAL | Status: DC
  Administered 2021-09-19 – 2021-09-24 (×6): 3 mg via ORAL
  Filled 2021-09-19 (×6): qty 1

## 2021-09-19 MED ORDER — JUVEN PO PACK
1.0000 | PACK | Freq: Two times a day (BID) | ORAL | Status: DC
  Administered 2021-09-19 – 2021-09-25 (×9): 1 via ORAL
  Filled 2021-09-19 (×9): qty 1

## 2021-09-19 MED ORDER — AMIODARONE HCL 200 MG PO TABS
200.0000 mg | ORAL_TABLET | Freq: Every day | ORAL | Status: DC
  Administered 2021-09-20 – 2021-09-24 (×5): 200 mg via ORAL
  Filled 2021-09-19 (×5): qty 1

## 2021-09-19 MED ORDER — MOMETASONE FURO-FORMOTEROL FUM 100-5 MCG/ACT IN AERO
2.0000 | INHALATION_SPRAY | Freq: Two times a day (BID) | RESPIRATORY_TRACT | Status: DC
  Filled 2021-09-19: qty 8.8

## 2021-09-19 MED ORDER — PANTOPRAZOLE SODIUM 40 MG PO TBEC
40.0000 mg | DELAYED_RELEASE_TABLET | Freq: Every day | ORAL | Status: DC
  Administered 2021-09-20 – 2021-09-25 (×6): 40 mg via ORAL
  Filled 2021-09-19 (×6): qty 1

## 2021-09-19 MED ORDER — WHITE PETROLATUM EX OINT: TOPICAL_OINTMENT | CUTANEOUS | Status: DC | PRN

## 2021-09-19 MED ORDER — BISACODYL 10 MG RE SUPP
10.0000 mg | Freq: Every day | RECTAL | Status: DC | PRN
Start: 2021-09-19 — End: 2021-09-25
  Administered 2021-09-23: 10 mg via RECTAL
  Filled 2021-09-19: qty 1

## 2021-09-19 MED ORDER — FLEET ENEMA 7-19 GM/118ML RE ENEM: 1.0000 | ENEMA | Freq: Once | RECTAL | Status: DC | PRN

## 2021-09-19 MED ORDER — PROCHLORPERAZINE MALEATE 5 MG PO TABS: 5.0000 mg | ORAL_TABLET | Freq: Four times a day (QID) | ORAL | Status: DC | PRN

## 2021-09-19 MED ORDER — FINASTERIDE 2.5 MG HALF TABLET
2.5000 mg | ORAL_TABLET | Freq: Every day | ORAL | Status: DC
  Administered 2021-09-20 – 2021-09-25 (×6): 2.5 mg via ORAL
  Filled 2021-09-19 (×6): qty 1

## 2021-09-19 MED ORDER — PRAVASTATIN SODIUM 40 MG PO TABS
40.0000 mg | ORAL_TABLET | Freq: Every evening | ORAL | Status: DC
  Administered 2021-09-19 – 2021-09-24 (×6): 40 mg via ORAL
  Filled 2021-09-19 (×6): qty 1

## 2021-09-19 MED ORDER — ZINC OXIDE 12.8 % EX OINT
TOPICAL_OINTMENT | CUTANEOUS | Status: DC | PRN
  Filled 2021-09-19: qty 56.7

## 2021-09-19 MED ORDER — ENSURE ENLIVE PO LIQD
237.0000 mL | Freq: Two times a day (BID) | ORAL | 12 refills | Status: DC
Start: 2021-09-19 — End: 2021-10-10

## 2021-09-19 MED ORDER — CHLORHEXIDINE GLUCONATE CLOTH 2 % EX PADS
6.0000 | MEDICATED_PAD | Freq: Every day | CUTANEOUS | Status: DC
  Administered 2021-09-20 – 2021-09-22 (×3): 6 via TOPICAL

## 2021-09-19 MED ORDER — RENA-VITE PO TABS
1.0000 | ORAL_TABLET | Freq: Every day | ORAL | Status: DC
  Administered 2021-09-19 – 2021-09-24 (×6): 1 via ORAL
  Filled 2021-09-19 (×6): qty 1

## 2021-09-19 MED ORDER — ALUM & MAG HYDROXIDE-SIMETH 200-200-20 MG/5ML PO SUSP: 30.0000 mL | ORAL | Status: DC | PRN

## 2021-09-19 MED ORDER — TRAMADOL HCL 50 MG PO TABS
50.0000 mg | ORAL_TABLET | Freq: Four times a day (QID) | ORAL | Status: DC | PRN
  Administered 2021-09-19 – 2021-09-21 (×4): 50 mg via ORAL
  Filled 2021-09-19 (×4): qty 1

## 2021-09-19 MED ORDER — PHENOBARBITAL 32.4 MG PO TABS
64.8000 mg | ORAL_TABLET | Freq: Two times a day (BID) | ORAL | Status: DC
  Administered 2021-09-19 – 2021-09-25 (×12): 64.8 mg via ORAL
  Filled 2021-09-19 (×13): qty 2

## 2021-09-19 MED ORDER — INSULIN ASPART 100 UNIT/ML IJ SOLN
0.0000 [IU] | Freq: Three times a day (TID) | INTRAMUSCULAR | Status: DC
  Administered 2021-09-20: 2 [IU] via SUBCUTANEOUS

## 2021-09-19 MED ORDER — INSULIN GLARGINE-YFGN 100 UNIT/ML ~~LOC~~ SOLN
15.0000 [IU] | Freq: Every day | SUBCUTANEOUS | 11 refills | Status: DC
Start: 2021-09-20 — End: 2021-09-19

## 2021-09-19 MED ORDER — ASCORBIC ACID 500 MG PO TABS
500.0000 mg | ORAL_TABLET | Freq: Two times a day (BID) | ORAL | Status: DC
  Administered 2021-09-19 – 2021-09-25 (×12): 500 mg via ORAL
  Filled 2021-09-19 (×12): qty 1

## 2021-09-19 MED ORDER — MOMETASONE FURO-FORMOTEROL FUM 100-5 MCG/ACT IN AERO
2.0000 | INHALATION_SPRAY | Freq: Two times a day (BID) | RESPIRATORY_TRACT | Status: DC
  Administered 2021-09-19 – 2021-09-25 (×11): 2 via RESPIRATORY_TRACT
  Filled 2021-09-19: qty 8.8

## 2021-09-19 MED ORDER — CARVEDILOL 6.25 MG PO TABS
6.2500 mg | ORAL_TABLET | Freq: Two times a day (BID) | ORAL | Status: DC
Start: 2021-09-19 — End: 2021-09-21
  Administered 2021-09-19 – 2021-09-21 (×4): 6.25 mg via ORAL
  Filled 2021-09-19 (×4): qty 1

## 2021-09-19 MED ORDER — ZINC SULFATE 220 (50 ZN) MG PO CAPS
220.0000 mg | ORAL_CAPSULE | Freq: Every day | ORAL | Status: DC
  Administered 2021-09-19 – 2021-09-24 (×6): 220 mg via ORAL
  Filled 2021-09-19 (×6): qty 1

## 2021-09-19 NOTE — Discharge Summary (Signed)
Physician Discharge Summary   Patient: Ricky Hayden MRN: [id number] DOB: [birth year]  Admit date:     08/28/2021  Discharge date: 09/19/21  Discharge Physician: Edwin Dada   PCP: [facility]     Recommendations at discharge:  Follow up with Cardiologist within 2 weeks of discharge from rehab Follow up with Urology Dr. Thalia Bloodgood within 2-4 weeks after discharge from rehab Decrease torsemide and hold spironolactone and hydralazine; titrate back as able Continue home oxygen at rehab, no need to wean     Discharge Diagnoses: Principal Problem:   Septic shock due to Proteus bacteremia Active Problems:   Bacteremia due to Gram-negative bacteria (proteus mirabilis) due to UTI   Cardiac arrest ([place])   Acute on chronic respiratory failure with hypoxia and hypercapnia (HCC)   BPH (benign prostatic hyperplasia)   Proteus (mirabilis) (morganii) as the cause of diseases classified elsewhere   Complicated UTI (urinary tract infection)   Nephrolithiasis   Acute on chronic combined systolic and diastolic CHF, NYHA class 2 (HCC)   Acute renal failure due to sepsis superimposed on chronic kidney disease   Stage 3b chronic kidney disease (CKD) (HCC)   NICM (nonischemic cardiomyopathy) (HCC)   COPD (chronic obstructive pulmonary disease) (HCC)   Type 2 diabetes mellitus with chronic kidney disease, without long-term current use of insulin (HCC)   Paroxysmal atrial fibrillation (HCC)   Acute metabolic encephalopathy   History of craniotomy   Seizure disorder (HCC)   Normocytic anemia   Pressure injury of sacral region, stage 2 ([place])   Delirium   Morbid obesity [facility])    Hospital Course: Ricky Hayden is a 76 y.o. M with chronic systolic HF, Afib, chronic kidney disease and COPD on home O2 PRN and hx ICH s/p evacuation 1995 who presented with hematuria then fever and SOB.  In the ER, required BiPAP.  CT showed kidney stones without hydro.  Urology consulted, no stenting  needed.  Admitted and blood cultures with Proteus.  ID on board.  4/27: Admitted for UTI and started on appropriate antibiotics 5/2: developed increased O2 requirements, CCM consulted and transferred to ICU and  5/2: intubated. Brief cardiac arrest after intubation 5/12: extubated after diuresing. 5/13: transferred to medical floor.  Subsequently stabilized and discharged to inpatient rehab.    * Septic shock ([place]) Bacteremia due to Gram-negative bacteria Complicated UTI (urinary tract infection) Proteus (mirabilis) (morganii) as the cause of diseases classified elsewhere Due to proteus bacteremia.  Despite timely antibiotics, patient developed septic shock with respiratory failure, renal failure, and required pressors for 48 hours.  Now resolved.  Completed 10 days antibiotics.    Cardiac arrest [facility]) Patient had brief PEA arrest after intubation.  ROSC was apparently quick.  Acute on chronic respiratory failure with hypoxia and hypercapnia (HCC) At baseline, patient has advanced COPD and CHF, uses 2L at home PRN and at night.  After admission, the patient developed septic shock with respiratory failure requiring intubation.  This is now resolved and he is weaned back to his home oxygen. - Continue pulmonary toilet - Continue torsemide    Nephrolithiasis Seen by Urology Dr. Thalia Bloodgood early in hospital stay - Will need outpatient f/u for cysto and likely contrasted imaging study    Delirium Acute metabolic encephalopathy At baseline patient has no significant cognitive impairmeent and is independent with ADLs.  When his health deteriorated and he was transferred to ICU he had decreased mentation, disorientation from septic shock and hypercapnic respiratory failure.    This  resolved after extubation although patient has had some mild waxing and waning hypoactive delirium.  Recommend rehabilitation     Paroxysmal atrial fibrillation ([place]) On apixaban from outpatient [place]  Cardiologist, known interaction with Luminal. - Continue apixaban and amiodarone and Coreg  Type 2 diabetes mellitus with chronic kidney disease, without long-term current use of insulin ([place]) May need to titrate glargine insulin with resumption of SGLT2i.  ARF (acute renal failure) (HCC) Baseline Cr 1.3-1.6, CKD IIIb, but here had renal failure up to 3.7 due to septic shock.  Now resolved.   Acute on chronic combined systolic and diastolic CHF, NYHA class 2 (HCC) NICM (nonischemic cardiomyopathy) ([place]) Developed respiratory failure in part due to fluid overload.  Net negative >7L on admission, completed IV diuresis and back on torsemide.  Appears euvolemic at discharge.   COPD (chronic obstructive pulmonary disease) (HCC) FEV1 46% at baseline in 2015, severe disease.   Continue home Symbicort at discharge.               The [facility] Controlled Substances Registry was reviewed for this patient prior to discharge.   Consultants: Urology, Critical Care, Nephrology, Palliative Care Procedures performed: Intubation, Extubation, Art line and Central line placement,  echocardiogram, CT head, CT renal protocol Disposition:  Inpatient rehab   DISCHARGE MEDICATION: Allergies as of 09/19/2021       Reactions   Calcium Channel Blockers Other (See Comments)   Came to hospital in 1972-caused chest pain        Medication List     STOP taking these medications    hydrALAZINE 50 MG tablet Commonly known as: APRESOLINE   spironolactone 25 MG tablet Commonly known as: ALDACTONE   sulfamethoxazole-trimethoprim 400-80 MG tablet Commonly known as: BACTRIM       TAKE these medications    acetaminophen 500 MG tablet Commonly known as: TYLENOL Take 1,000 mg by mouth every 6 (six) hours as needed for moderate pain or headache.   albuterol 108 (90 Base) MCG/ACT inhaler Commonly known as: VENTOLIN HFA Inhale 2 puffs into the lungs every 6 (six) hours as needed for  wheezing.   amiodarone 200 MG tablet Commonly known as: PACERONE Take 1 tablet (200 mg total) by mouth daily.   apixaban 5 MG Tabs tablet Commonly known as: ELIQUIS Take 1 tablet by mouth in the morning and at bedtime.   budesonide-formoterol 80-4.5 MCG/ACT inhaler Commonly known as: Symbicort Inhale 2 puffs into the lungs 2 (two) times daily.   carvedilol 6.25 MG tablet Commonly known as: COREG Take 1 tablet (6.25 mg total) by mouth 2 (two) times daily with a meal.   cetirizine 10 MG tablet Commonly known as: ZYRTEC Take 1 tablet by mouth daily.   diclofenac Sodium 1 % Gel Commonly known as: VOLTAREN Apply 2 g topically daily as needed (pain).   docusate sodium 100 MG capsule Commonly known as: COLACE Take 1-2 capsules (100-200 mg total) by mouth 2 (two) times daily as needed for mild constipation.   empagliflozin 10 MG Tabs tablet Commonly known as: Jardiance Take 1 tablet (10 mg total) by mouth daily before breakfast.   Entresto 24-26 MG Generic drug: sacubitril-valsartan Take 1 tablet by mouth 2 (two) times daily.   feeding supplement Liqd Take 237 mLs by mouth 2 (two) times daily between meals.   finasteride 5 MG tablet Commonly known as: PROSCAR TAKE ONE TABLET BY MOUTH ONCE DAILY   GINKGO BILOBA PO Take 2 tablets by mouth at bedtime.  ICAPS AREDS 2 PO Take 2 tablets by mouth daily.   insulin glargine-yfgn 100 UNIT/ML injection Commonly known as: SEMGLEE Inject 0.08 mLs (8 Units total) into the skin daily. Start taking on: Sep 20, 2021   omeprazole 20 MG capsule Commonly known as: PRILOSEC Take 1 capsule (20 mg total) by mouth daily.   PHENobarbital 64.8 MG tablet Commonly known as: LUMINAL Take 1 tablet (64.8 mg total) by mouth 2 (two) times daily.   polyethylene glycol 17 g packet Commonly known as: MIRALAX / GLYCOLAX Take 17 g by mouth daily as needed for moderate constipation.   pravastatin 40 MG tablet Commonly known as: PRAVACHOL Take  1 tablet (40 mg total) by mouth every evening.   Spiriva HandiHaler 18 MCG inhalation capsule Generic drug: tiotropium INHALE ONE DOSE BY MOUTH ONCE DAILY   tamsulosin 0.4 MG Caps capsule Commonly known as: FLOMAX Take 1 capsule (0.4 mg total) by mouth daily.   torsemide 20 MG tablet Commonly known as: DEMADEX Take 1 tablet (20 mg total) by mouth 2 (two) times daily. What changed:  how much to take how to take this when to take this additional instructions   traMADol 50 MG tablet Commonly known as: ULTRAM Take 1 tablet (50 mg total) by mouth every 6 (six) hours as needed for moderate pain.               Discharge Care Instructions  (From admission, onward)           Start     Ordered   09/19/21 0000  Discharge wound care:       Comments: Clean the area at the anus with soap and water or no rinse cleanser, pat dry and apply a coat of triple paste over the anus area to block out additional moisture. Apply twice daily or PRN soiling.   09/19/21 1122             Discharge Instructions     Discharge wound care:   Complete by: As directed    Clean the area at the anus with soap and water or no rinse cleanser, pat dry and apply a coat of triple paste over the anus area to block out additional moisture. Apply twice daily or PRN soiling.       Discharge Exam: Filed Weights   09/10/21 0320 09/11/21 0500 09/12/21 0500  Weight: 106.2 kg 107.7 kg 103.9 kg    General: Pt is alert, awake, not in acute distress, sitting in recliner Cardiovascular: RRR, nl S1-S2, no murmurs appreciated.   No LE edema.   Respiratory: Normal respiratory rate and rhythm.  No rales.  Mild expiratory wheezing. Abdominal: Abdomen soft and non-tender.  No distension or HSM.   Neuro/Psych: Strength symmetric in upper and lower extremities.  Judgment and insight appear slightly impaired.   Condition at discharge: stable  The results of significant diagnostics from this hospitalization  (including imaging, microbiology, ancillary and laboratory) are listed below for reference.   Imaging Studies: DG Abd 1 View  Result Date: 09/03/2021 CLINICAL DATA:  OG tube placement. EXAM: ABDOMEN - 1 VIEW COMPARISON:  Chest x-ray of the same date. Recent abdominal CT from April of 2023. FINDINGS: Gastric tube, tip in the antro pyloric region. Small bowel distension up to 3.7 cm, bowel gas pattern incompletely assessed. Stool and gas in the colon. Moderate stool likely in ascending colon. IMPRESSION: Gastric tube with tip in the antropyloric region. Small bowel distension, incompletely assessed. Full extent of the  abdomen is incompletely evaluated. Findings could reflect ileus but would suggest follow-up abdominal assessments to distinguish between developing small bowel obstruction. Electronically Signed   By: Zetta Bills M.D.   On: 09/03/2021 10:41   CT HEAD WO CONTRAST (5MM)  Result Date: 09/05/2021 CLINICAL DATA:  Altered mental status EXAM: CT HEAD WITHOUT CONTRAST TECHNIQUE: Contiguous axial images were obtained from the base of the skull through the vertex without intravenous contrast. RADIATION DOSE REDUCTION: This exam was performed according to the departmental dose-optimization program which includes automated exposure control, adjustment of the mA and/or kV according to patient size and/or use of iterative reconstruction technique. COMPARISON:  07/06/2021 FINDINGS: Brain: Left frontal lobe encephalomalacia with ex vacuo dilatation of the left lateral ventricle, stable in appearance from prior. No evidence of acute infarction, hemorrhage, hydrocephalus, extra-axial collection, or mass lesion. Patchy low-density changes within the periventricular and subcortical white matter compatible with chronic microvascular ischemic change. Mild diffuse cerebral volume loss. Vascular: No hyperdense vessel or unexpected calcification. Skull: Prior left frontal craniotomy. Negative for acute calvarial  fracture. Sinuses/Orbits: Complete opacification of the right sphenoid sinus with mucosal thickening in the posterior right ethmoid air cells. Other: None. IMPRESSION: No acute intracranial findings.  Stable exam. Electronically Signed   By: Davina Poke D.O.   On: 09/05/2021 16:21   CT HEAD WO CONTRAST (5MM)  Result Date: 09/02/2021 CLINICAL DATA:  Altered level of consciousness EXAM: CT HEAD WITHOUT CONTRAST TECHNIQUE: Contiguous axial images were obtained from the base of the skull through the vertex without intravenous contrast. RADIATION DOSE REDUCTION: This exam was performed according to the departmental dose-optimization program which includes automated exposure control, adjustment of the mA and/or kV according to patient size and/or use of iterative reconstruction technique. COMPARISON:  None Available. FINDINGS: Brain: Left frontal lobe encephalomalacia with ex vacuo dilatation of the left lateral ventricle. Scattered hypodensities throughout the periventricular white matter and bilateral basal ganglia consistent with age-indeterminate small vessel ischemic changes, likely chronic. No other signs of acute infarct or hemorrhage. Lateral ventricles and midline structures are otherwise unremarkable. No acute extra-axial fluid collections. No mass effect. Vascular: No hyperdense vessel or unexpected calcification. Skull: Prior left frontal craniotomy.  No acute displaced fracture. Sinuses/Orbits: Polypoid mucosal thickening right posterior ethmoid air cells and right sphenoid sinus. Other: None. IMPRESSION: 1. Prior left frontal craniotomy with underlying encephalomalacia. 2. Scattered hypodensities within the periventricular white matter and bilateral basal ganglia, most consistent with chronic small vessel ischemic change. 3. Otherwise no acute intracranial process. Electronically Signed   By: Randa Ngo M.D.   On: 09/02/2021 19:16   DG CHEST PORT 1 VIEW  Result Date: 09/16/2021 CLINICAL DATA:   Short of breath. EXAM: PORTABLE CHEST 1 VIEW COMPARISON:  09/09/2021 FINDINGS: Cardiac enlargement without heart failure. Pacemaker unchanged. Endotracheal tube and NG tube have been removed in the interval. Left lower lobe airspace disease with slight progression. Right lung clear. IMPRESSION: Increased left lower lobe airspace disease which may be atelectasis. Electronically Signed   By: Franchot Gallo M.D.   On: 09/16/2021 12:45   DG CHEST PORT 1 VIEW  Result Date: 09/09/2021 CLINICAL DATA:  Pneumonia EXAM: PORTABLE CHEST 1 VIEW COMPARISON:  09/05/2021 FINDINGS: Mild cardiac enlargement. Normal vascularity. Negative for heart failure. Endotracheal tube in good position. NG tube in the stomach. Right jugular central venous catheter tip mid SVC. Pacemaker leads unchanged. Left lower lobe airspace disease unchanged. Improvement in mild right lower lobe airspace disease. IMPRESSION: Support lines remain in good position  Left lower lobe atelectasis/infiltrate unchanged. Mild improved aeration right lung base. Improved lung volume compared with the prior study. Electronically Signed   By: Franchot Gallo M.D.   On: 09/09/2021 11:12   DG CHEST PORT 1 VIEW  Result Date: 09/05/2021 CLINICAL DATA:  76 year old male with respiratory distress. Intubated. EXAM: PORTABLE CHEST 1 VIEW COMPARISON:  Portable chest 09/03/2021 and earlier. FINDINGS: Portable AP semi upright view at 0442 hours. Stable lines and tubes. Stable left chest pacemaker. Moderately regressed Patchy and confluent right lung opacity, mild residual. Mildly lower lung volumes. No pneumothorax. Small left pleural effusion is possible. And left lung base retrocardiac opacity has mildly increased. But elsewhere the left lung is well ventilated. Mediastinal contours remain normal. Paucity of bowel gas in the upper abdomen. No acute osseous abnormality identified. IMPRESSION: 1. Stable lines and tubes. 2. Moderately improved right lung ventilation and  regressed opacity since 09/03/2021. 3. Mildly increased left lung base opacity, but favor atelectasis and/or small left pleural effusion. Electronically Signed   By: Genevie Ann M.D.   On: 09/05/2021 05:48   DG CHEST PORT 1 VIEW  Result Date: 09/03/2021 CLINICAL DATA:  Central line placement. EXAM: PORTABLE CHEST 1 VIEW COMPARISON:  09/03/2021, earlier same day FINDINGS: 1026 hours. Endotracheal tube tip is 3.6 cm above the base of the carina. The NG tube passes into the stomach although the distal tip position is not included on the film. Right IJ central line tip overlies the proximal to mid SVC level. Diffuse airspace disease noted right lung with relative sparing of the left lung. No substantial pleural effusion. The cardio pericardial silhouette is enlarged. Left permanent pacemaker noted. Telemetry leads overlie the chest. IMPRESSION: 1. Support apparatus as described. No evidence for right-sided pneumothorax or pleural effusion. 2. Diffuse right lung airspace disease with relative sparing of the left lung. Given rapid interval appearance, aspiration would be a consideration. Asymmetric pulmonary edema could have this appearance. Electronically Signed   By: Misty Stanley M.D.   On: 09/03/2021 10:41   DG Chest Port 1 View  Result Date: 09/03/2021 CLINICAL DATA:  Acute respiratory failure EXAM: PORTABLE CHEST 1 VIEW COMPARISON:  08/30/2021 FINDINGS: Chronic cardiomegaly with biventricular pacer. Stable fullness of the hila which appears vascular. No air bronchogram, Kerley lines, effusion, or pneumothorax. Artifact from EKG leads. IMPRESSION: Stable cardiomegaly and vascular congestion. Electronically Signed   By: Jorje Guild M.D.   On: 09/03/2021 07:05   DG CHEST PORT 1 VIEW  Result Date: 08/30/2021 CLINICAL DATA:  Hypoxia EXAM: PORTABLE CHEST 1 VIEW COMPARISON:  08/29/2021 and prior chest radiographs FINDINGS: Cardiomegaly and mild pulmonary vascular congestion noted. Mild bibasilar opacities are  slightly increased represent mild atelectasis versus airspace disease. An equivocal small LEFT pleural effusion is noted. There is no evidence of pneumothorax. LEFT-sided pacemaker is again noted. IMPRESSION: 1. Mild bibasilar opacities - question atelectasis versus airspace disease. 2. Cardiomegaly with mild pulmonary vascular congestion. Electronically Signed   By: Margarette Canada M.D.   On: 08/30/2021 15:34   DG Chest Port 1 View  Result Date: 08/29/2021 CLINICAL DATA:  Questionable sepsis EXAM: PORTABLE CHEST 1 VIEW COMPARISON:  01/29/2020 FINDINGS: Cardiomegaly. Left pacer remains in place, unchanged. No confluent opacities or effusions. No acute bony abnormality. IMPRESSION: Cardiomegaly.  No active disease. Electronically Signed   By: Rolm Baptise M.D.   On: 08/29/2021 00:19   DG Swallowing Func-Speech Pathology  Result Date: 09/16/2021 Table formatting from the original result was not included. Objective Swallowing Evaluation:  Type of Study: MBS-Modified Barium Swallow Study  Patient Details Name: Ricky Hayden MRN: [id number] Date of Birth: 16-Mar-1946 Today's Date: 09/16/2021 Time: SLP Start Time (ACUTE ONLY): 32 -SLP Stop Time (ACUTE ONLY): 1046 SLP Time Calculation (min) (ACUTE ONLY): 16 min Past Medical History: Past Medical History: Diagnosis Date  Arthritis   osteoarthritis of left knee  Ascending aorta dilatation (HCC)   Balanitis   recurrent  Cardiac arrhythmia   life threatening, secondary to CCB vs b- blockers  Cardiomyopathy ([place])   Chronic joint pain   Chronic systolic CHF (congestive heart failure) (HCC)   CKD (chronic kidney disease), stage III (HCC)   COPD (chronic obstructive pulmonary disease) ([place])   Coronary artery disease   Dilated aortic root (HCC)   Enlarged prostate   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 Sherwood Gambler  Nocturia   Obesity   Pansinusitis   a.  complicated by brain abscess and bleeding requiring craniotomy in 1995.  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  Procedure: PACEMAKER IMPLANT;  Surgeon: Deboraha Sprang, MD;  Location: [place] [facility];  Service: Cardiovascular;  Laterality: N/A;  RIGHT/LEFT HEART CATH AND CORONARY ANGIOGRAPHY N/A 09/20/2019  Procedure: RIGHT/LEFT HEART CATH AND CORONARY ANGIOGRAPHY;  Surgeon: Jolaine Artist, MD;  Location: [place] [facility];  Service: Cardiovascular;  Laterality: N/A;  shoulder surg rt   1995  SINUS SURGERY WITH INSTATRAK    ethmoidectomy and nasal septum repair HPI: Ricky Hayden is a 76 yo gentleman w/ pertinent PMH of chronic HFrEF with ICD, a-fib, CKD stage IIIb, COPD who presented to [facility] on 5/2 w/ hematuria x 3 weeks. The week prior to admission he was started on Bactrim for urine cultures showing proteus mirabilis. He began having worsening fever and SOB and was brought to [facility] ED on 4/27.  At admission he was initially started on BiPAP for SOB. Fever 103 F. Started on empiric abx. CT abdomen showing nonobstructive nephrolithiasis. Urology consulted with no indication for procedure.  Further work up showing sepsis from UTI w/ pyelonephritis. Blood cultures were positive for proteus mirabilis. On Ancef with ID managing antibiotics.  On 5/2 he began having decreased LOC and increased O2 requirements. Intubated 5/3 with  short PEA arrest after intubation. Extubated 5/12.  No data recorded  Recommendations for follow up therapy are one component of a multi-disciplinary discharge planning process, led by the attending physician.  Recommendations may be updated based on patient status, additional functional criteria and insurance authorization. Assessment / Plan / Recommendation   09/16/2021  10:00 AM Clinical Impressions Clinical Impression Patient presents with mild pharyngeal phase dysphagia characterized primarily be decreased laryngeal closure s/p 10 day intubation, resulting in deep penetration of thin and nectar thick liquids. Penetrates almost reached the cords, clearing with inconsistent throat  clear. Cued throat clear successful as well. Post swallow, he does have mild vallecular and pyriform sinus residue due to decreased hyolaryngeal movement which clears with typically spontaneous dry swallow. Recommend continuing current diet (patient with slow but efficient mastication) with aspiration precautions. He will benefit from continued SLP f/u to reinforce compensatory strategies to mitigate aspiration risk. SLP Visit Diagnosis Dysphagia, pharyngeal phase (R13.13) Impact on safety and function Mild aspiration risk     09/16/2021  10:00 AM Treatment Recommendations Treatment Recommendations Therapy as outlined in treatment plan below     09/16/2021  10:00 AM Prognosis Prognosis for Safe Diet Advancement Good   09/16/2021  10:00 AM Diet Recommendations SLP Diet Recommendations Dysphagia 3 (Mech soft) solids;Thin liquid Liquid Administration via Cup;Straw Medication Administration Whole meds with puree Compensations Slow rate;Small sips/bites;Clear throat intermittently Postural Changes Remain semi-upright after after feeds/meals (Comment)     09/16/2021  10:00 AM Other Recommendations Oral Care Recommendations Oral care BID Follow Up Recommendations No SLP follow up Assistance recommended at discharge None   09/13/2021   8:00 AM  Frequency and Duration  Speech Therapy Frequency (ACUTE ONLY) min 2x/week Treatment Duration 1 week     09/16/2021  10:00 AM Oral Phase Oral Phase [facility]    09/16/2021  10:00 AM Pharyngeal Phase Pharyngeal Phase Impaired Pharyngeal- Nectar Teaspoon NT Pharyngeal- Nectar Cup Reduced airway/laryngeal closure;Pharyngeal residue - valleculae;Pharyngeal residue - pyriform;Penetration/Aspiration during swallow;Reduced anterior laryngeal mobility;Reduced laryngeal elevation Pharyngeal Material enters airway, remains ABOVE vocal cords and not ejected out Pharyngeal- Nectar Straw NT Pharyngeal- Thin Teaspoon NT Pharyngeal- Thin Cup Reduced airway/laryngeal closure;Pharyngeal residue - valleculae;Pharyngeal residue - pyriform;Penetration/Aspiration during swallow;Reduced anterior laryngeal mobility;Reduced laryngeal elevation Pharyngeal Material enters airway, remains ABOVE vocal cords and not ejected out Pharyngeal- Thin Straw Reduced airway/laryngeal closure;Pharyngeal residue - valleculae;Pharyngeal residue - pyriform;Penetration/Aspiration during swallow;Reduced anterior laryngeal mobility;Reduced laryngeal elevation Pharyngeal Material enters airway, remains ABOVE vocal cords and not ejected out Pharyngeal- Puree Pharyngeal residue - pyriform;Pharyngeal residue - valleculae;Reduced anterior laryngeal mobility;Reduced laryngeal elevation Pharyngeal Material does not enter airway Pharyngeal- Mechanical Soft Pharyngeal residue - valleculae;Pharyngeal residue - pyriform;Reduced anterior laryngeal mobility;Reduced laryngeal elevation Pharyngeal Material does not enter airway Pharyngeal- [facility]    09/16/2021  10:00 AM Cervical Esophageal Phase  Cervical Esophageal Phase [facility] Gabriel Rainwater MA, CCC-SLP [place] Leah Meryl 09/16/2021, 11:02 AM                     EEG adult  Result Date: 09/04/2021 Lora Havens, MD     09/04/2021 10:59 AM Patient Name: Ricky Hayden MRN: [id number] Epilepsy Attending: Lora Havens Referring  Physician/Provider: Lanier Clam, MD Date: 09/04/2021 Duration: 34.45 mins Patient history: 76yo M with ams. EEG to evaluate for seizure Level of alertness: lethargic AEDs during EEG study: Phenobarb Technical aspects: This EEG study was done with scalp electrodes positioned according to the 10-20 International system of electrode placement. Electrical activity was acquired at a sampling rate of 500Hz  and reviewed with a high frequency filter of 70Hz  and a low frequency filter of 1Hz . EEG data were recorded continuously and digitally stored. Description: EEG showed continuous generalized 3 to 7 Hz theta-delta slowing. Hyperventilation and photic stimulation were not performed.   ABNORMALITY - Continuous slow, generalized IMPRESSION: This study is suggestive of moderate to severe diffuse encephalopathy, nonspecific etiology. No seizures or epileptiform discharges were seen throughout the recording. Lora Havens   ECHOCARDIOGRAM COMPLETE  Result Date: 08/29/2021    ECHOCARDIOGRAM REPORT   Patient Name:  Ricky Hayden Date of Exam: 08/29/2021 Medical Rec #:  [id number]     Height:       67.0 in Accession #:    [id number]    Weight:       236.4 lb Date of Birth:  03/26/1946    BSA:          2.171 m Patient Age:    51 years      BP:           132/72 mmHg Patient Gender: M             HR:           81 bpm. Exam Location:  Inpatient Procedure: 2D Echo, Cardiac Doppler, Color Doppler and Intracardiac            Opacification Agent Indications:    CHF-Acute systolic  History:        Patient has prior history of Echocardiogram examinations, most                 recent 12/11/2019. CHF and Cardiomyopathy, Pacemaker, COPD,                 Signs/Symptoms:Shortness of Breath; Risk Factors:Hypertension                 and Dyslipidemia. Ascending aorta dilitation.  Sonographer:    Clayton Lefort RDCS (AE) Referring Phys: [id number] Kayleen Memos  Sonographer Comments: Technically difficult study due to poor echo windows,  suboptimal parasternal window, suboptimal apical window, suboptimal subcostal window and patient is morbidly obese. Image acquisition challenging due to patient body habitus. Patient sensitive to probe pressure IMPRESSIONS  1. Left ventricular ejection fraction, by estimation, is 40 to 45%. The left ventricle has mildly decreased function. The left ventricle has no regional wall motion abnormalities. There is mild left ventricular hypertrophy. Left ventricular diastolic parameters are consistent with Grade I diastolic dysfunction (impaired relaxation).  2. Right ventricular systolic function is normal. The right ventricular size is normal.  3. The mitral valve is normal in structure. No evidence of mitral valve regurgitation. No evidence of mitral stenosis.  4. The aortic valve is normal in structure. There is moderate calcification of the aortic valve. There is moderate thickening of the aortic valve. Aortic valve regurgitation is not visualized. Aortic valve sclerosis/calcification is present, without any  evidence of aortic stenosis.  5. Aortic dilatation noted. There is mild dilatation of the ascending aorta, measuring 41 mm.  6. The inferior vena cava is normal in size with greater than 50% respiratory variability, suggesting right atrial pressure of 3 mmHg. Comparison(s): Prior images reviewed side by side. The left ventricular function has improved. FINDINGS  Left Ventricle: Left ventricular ejection fraction, by estimation, is 40 to 45%. The left ventricle has mildly decreased function. The left ventricle has no regional wall motion abnormalities. Definity contrast agent was given IV to delineate the left ventricular endocardial borders. The left ventricular internal cavity size was normal in size. There is mild left ventricular hypertrophy. Left ventricular diastolic parameters are consistent with Grade I diastolic dysfunction (impaired relaxation). Right Ventricle: The right ventricular size is normal. No  increase in right ventricular wall thickness. Right ventricular systolic function is normal. Left Atrium: Left atrial size was normal in size. Right Atrium: Right atrial size was normal in size. Pericardium: There is no evidence of pericardial effusion. Mitral Valve: The mitral valve is normal in structure. No evidence of mitral valve regurgitation. No evidence of mitral valve  stenosis. Tricuspid Valve: The tricuspid valve is normal in structure. Tricuspid valve regurgitation is not demonstrated. No evidence of tricuspid stenosis. Aortic Valve: The aortic valve is normal in structure. There is moderate calcification of the aortic valve. There is moderate thickening of the aortic valve. Aortic valve regurgitation is not visualized. Aortic valve sclerosis/calcification is present, without any evidence of aortic stenosis. Aortic valve mean gradient measures 4.0 mmHg. Aortic valve peak gradient measures 6.6 mmHg. Aortic valve area, by VTI measures 4.12 cm. Pulmonic Valve: The pulmonic valve was normal in structure. Pulmonic valve regurgitation is not visualized. No evidence of pulmonic stenosis. Aorta: Aortic dilatation noted. There is mild dilatation of the ascending aorta, measuring 41 mm. Venous: The inferior vena cava is normal in size with greater than 50% respiratory variability, suggesting right atrial pressure of 3 mmHg. IAS/Shunts: No atrial level shunt detected by color flow Doppler.  LEFT VENTRICLE PLAX 2D LVIDd:         5.40 cm   Diastology LVIDs:         4.60 cm   LV e' medial:    5.11 cm/s LV PW:         1.30 cm   LV E/e' medial:  14.8 LV IVS:        1.40 cm   LV e' lateral:   7.62 cm/s LVOT diam:     2.70 cm   LV E/e' lateral: 9.9 LV SV:         97 LV SV Index:   45 LVOT Area:     5.73 cm  LEFT ATRIUM             Index LA diam:        3.30 cm 1.52 cm/m LA Vol (A2C):   89.5 ml 41.22 ml/m LA Vol (A4C):   85.0 ml 39.15 ml/m LA Biplane Vol: 87.9 ml 40.48 ml/m  AORTIC VALVE AV Area (Vmax):    4.56 cm AV  Area (Vmean):   4.11 cm AV Area (VTI):     4.12 cm AV Vmax:           128.00 cm/s AV Vmean:          89.600 cm/s AV VTI:            0.235 m AV Peak Grad:      6.6 mmHg AV Mean Grad:      4.0 mmHg LVOT Vmax:         102.00 cm/s LVOT Vmean:        64.300 cm/s LVOT VTI:          0.169 m LVOT/AV VTI ratio: 0.72  AORTA Ao Root diam: 4.30 cm Ao Asc diam:  4.10 cm MITRAL VALVE MV Area (PHT): 4.39 cm    SHUNTS MV Decel Time: 173 msec    Systemic VTI:  0.17 m MV E velocity: 75.40 cm/s  Systemic Diam: 2.70 cm MV A velocity: 98.50 cm/s MV E/A ratio:  0.77 Candee Furbish MD Electronically signed by Candee Furbish MD Signature Date/Time: 08/29/2021/4:05:20 PM    Final    CT RENAL STONE STUDY  Result Date: 08/29/2021 CLINICAL DATA:  Flank pain.  Kidney stones suspected. EXAM: CT ABDOMEN AND PELVIS WITHOUT CONTRAST TECHNIQUE: Multidetector CT imaging of the abdomen and pelvis was performed following the standard protocol without IV contrast. RADIATION DOSE REDUCTION: This exam was performed according to the departmental dose-optimization program which includes automated exposure control, adjustment of the mA and/or kV according to patient  size and/or use of iterative reconstruction technique. COMPARISON:  No comparison studies available. FINDINGS: Lower chest: Basilar atelectasis in both lower lobes. Hepatobiliary: No suspicious focal abnormality in the liver on this study without intravenous contrast. There is no evidence for gallstones, gallbladder wall thickening, or pericholecystic fluid. No intrahepatic or extrahepatic biliary dilation. Pancreas: No focal mass lesion. No dilatation of the main duct. No intraparenchymal cyst. No peripancreatic edema. Spleen: No splenomegaly. No focal mass lesion. Adrenals/Urinary Tract: No adrenal nodule or mass. 3.2 cm low-density lesion interpolar right kidney approaches water density, likely a cyst. No stones are seen in the right kidney or ureter. 6 x 12 x 14 mm nonobstructing stone  identified lower pole left kidney with additional smaller left lower pole and interpolar calculi evident. 9 mm contoured deforming lesion lower pole left kidney on 55/3 has attenuation higher than would be expected for a cyst. Exophytic 15 mm lesion lower pole left kidney also with attenuation higher than a simple cyst. No left ureteral stone. The urinary bladder appears normal for the degree of distention. Stomach/Bowel: Stomach is unremarkable. No gastric wall thickening. No evidence of outlet obstruction. Duodenum is normally positioned as is the ligament of Treitz. No small bowel wall thickening. No small bowel dilatation. The terminal ileum is normal. The appendix is not well visualized, but there is no edema or inflammation in the region of the cecum. No gross colonic mass. No colonic wall thickening. Diverticular changes are noted in the left colon without evidence of diverticulitis. Vascular/Lymphatic: There is moderate atherosclerotic calcification of the abdominal aorta without aneurysm. There is no gastrohepatic or hepatoduodenal ligament lymphadenopathy. No retroperitoneal or mesenteric lymphadenopathy. No pelvic sidewall lymphadenopathy. Reproductive: The prostate gland and seminal vesicles are unremarkable. Other: No intraperitoneal free fluid. Musculoskeletal: No worrisome lytic or sclerotic osseous abnormality. IMPRESSION: 1. 6 x 12 x 14 mm nonobstructing stone lower pole left kidney with additional smaller left lower pole and interpolar calculi. No ureteral or bladder stones. 2. 9 mm contour deforming lesion lower pole left kidney has attenuation higher than would be expected for a simple cyst. This may be a cyst complicated by proteinaceous debris or hemorrhage, but neoplasm not excluded. Abdominal MRI with and without contrast could be used to further evaluate as clinically warranted. 3. Aortic Atherosclerosis (ICD10-I70.0). Electronically Signed   By: Misty Stanley M.D.   On: 08/29/2021 07:49     Microbiology: Results for orders placed or performed during the hospital encounter of 08/28/21  Urine Culture     Status: None   Collection Time: 08/28/21 11:38 PM   Specimen: In/Out Cath Urine  Result Value Ref Range Status   Specimen Description IN/OUT CATH URINE  Final   Special Requests NONE  Final   Culture   Final    NO GROWTH Performed at [facility], [place] [address] [place], [place] [address]    Report Status 08/30/2021 FINAL  Final  Blood Culture (routine x 2)     Status: Abnormal   Collection Time: 08/28/21 11:45 PM   Specimen: BLOOD LEFT FOREARM  Result Value Ref Range Status   Specimen Description BLOOD LEFT FOREARM  Final   Special Requests   Final    BOTTLES DRAWN AEROBIC AND ANAEROBIC Blood Culture adequate volume   Culture  Setup Time   Final    GRAM NEGATIVE RODS ANAEROBIC BOTTLE ONLY CRITICAL RESULT CALLED TO, READ BACK BY AND VERIFIED WITH: Vena Austria J.FRIENS ON [id number] AT [place] BY [place] Performed at [facility], [place] [address] [place], [place] [address]    Culture PROTEUS MIRABILIS (A)  Final   Report Status 09/02/2021 FINAL  Final   Organism ID, Bacteria PROTEUS MIRABILIS  Final      Susceptibility   Proteus mirabilis - MIC*    AMPICILLIN <=2 SENSITIVE Sensitive     CEFAZOLIN <=4 SENSITIVE Sensitive     CEFEPIME <=0.12 SENSITIVE Sensitive     CEFTAZIDIME <=1 SENSITIVE Sensitive     CEFTRIAXONE <=0.25 SENSITIVE Sensitive     CIPROFLOXACIN <=0.25 SENSITIVE Sensitive     GENTAMICIN <=1 SENSITIVE Sensitive     IMIPENEM 2 SENSITIVE Sensitive     TRIMETH/SULFA <=20 SENSITIVE Sensitive     AMPICILLIN/SULBACTAM <=2 SENSITIVE Sensitive     PIP/TAZO <=4 SENSITIVE Sensitive     * PROTEUS MIRABILIS  Blood Culture ID Panel (Reflexed)     Status: Abnormal   Collection Time: 08/28/21 11:45 PM  Result Value Ref Range Status   Enterococcus faecalis NOT DETECTED NOT DETECTED Final   Enterococcus Faecium NOT DETECTED NOT DETECTED Final   Listeria  monocytogenes NOT DETECTED NOT DETECTED Final   Staphylococcus species NOT DETECTED NOT DETECTED Final   Staphylococcus aureus (BCID) NOT DETECTED NOT DETECTED Final   Staphylococcus epidermidis NOT DETECTED NOT DETECTED Final   Staphylococcus lugdunensis NOT DETECTED NOT DETECTED Final   Streptococcus species NOT DETECTED NOT DETECTED Final   Streptococcus agalactiae NOT DETECTED NOT DETECTED Final   Streptococcus pneumoniae NOT DETECTED NOT DETECTED Final   Streptococcus pyogenes NOT DETECTED NOT DETECTED Final   A.calcoaceticus-baumannii NOT DETECTED NOT DETECTED Final   Bacteroides fragilis NOT DETECTED NOT DETECTED Final   Enterobacterales DETECTED (A) NOT DETECTED Final    Comment: Enterobacterales represent a large order of gram negative bacteria, not a single organism. CRITICAL RESULT CALLED TO, READ BACK BY AND VERIFIED WITH: PHARM D J.FRIENS ON [id number] AT 0941 BY E.PARRISH    Enterobacter cloacae complex NOT DETECTED NOT DETECTED Final   Escherichia coli NOT DETECTED NOT DETECTED Final   Klebsiella aerogenes NOT DETECTED NOT DETECTED Final   Klebsiella oxytoca NOT DETECTED NOT DETECTED Final   Klebsiella pneumoniae NOT DETECTED NOT DETECTED Final   Proteus species DETECTED (A) NOT DETECTED Final    Comment: CRITICAL RESULT CALLED TO, READ BACK BY AND VERIFIED WITH: PHARM D J.FRIENS ON [id number] AT 0941 BY E.PARRISH    Salmonella species NOT DETECTED NOT DETECTED Final   Serratia marcescens NOT DETECTED NOT DETECTED Final   Haemophilus influenzae NOT DETECTED NOT DETECTED Final   Neisseria meningitidis NOT DETECTED NOT DETECTED Final   Pseudomonas aeruginosa NOT DETECTED NOT DETECTED Final   Stenotrophomonas maltophilia NOT DETECTED NOT DETECTED Final   Candida albicans NOT DETECTED NOT DETECTED Final   Candida auris NOT DETECTED NOT DETECTED Final   Candida glabrata NOT DETECTED NOT DETECTED Final   Candida krusei NOT DETECTED NOT DETECTED Final   Candida parapsilosis NOT  DETECTED NOT DETECTED Final   Candida tropicalis NOT DETECTED NOT DETECTED Final   Cryptococcus neoformans/gattii NOT DETECTED NOT DETECTED Final   CTX-M ESBL NOT DETECTED NOT DETECTED Final   Carbapenem resistance IMP NOT DETECTED NOT DETECTED Final   Carbapenem resistance KPC NOT DETECTED NOT DETECTED Final   Carbapenem resistance NDM NOT DETECTED NOT DETECTED Final   Carbapenem resist OXA 48 LIKE NOT DETECTED NOT DETECTED Final   Carbapenem resistance VIM NOT DETECTED NOT DETECTED Final    Comment: Performed at [facility], [address], [place], [place] [address]  Blood Culture (routine x 2)     Status: None   Collection Time: 08/28/21 11:47 PM   Specimen: BLOOD  Result Value Ref Range Status   Specimen Description BLOOD LEFT ANTECUBITAL  Final   Special Requests   Final    BOTTLES DRAWN AEROBIC AND ANAEROBIC Blood Culture adequate volume   Culture   Final    NO GROWTH 5 DAYS Performed at [facility], [place] [address] [place], [place] [address]    Report Status 09/03/2021 FINAL  Final  Resp Panel by RT-PCR (Flu A&B, Covid) Nasopharyngeal Swab     Status: None   Collection Time: 08/29/21 12:13 AM   Specimen: Nasopharyngeal Swab; Nasopharyngeal(NP) swabs in vial transport medium  Result Value Ref Range Status   SARS Coronavirus 2 by RT PCR NEGATIVE NEGATIVE Final    Comment: (NOTE) SARS-CoV-2 target nucleic acids are NOT DETECTED.  The SARS-CoV-2 RNA is generally detectable in upper respiratory specimens during the acute phase of infection. The lowest concentration of SARS-CoV-2 viral copies this assay can detect is 138 copies/mL. A negative result does not preclude SARS-Cov-2 infection and should not be used as the sole basis for treatment or other patient management decisions. A negative result may occur with  improper specimen collection/handling, submission of specimen other than nasopharyngeal swab, presence of viral mutation(s) within the areas targeted by  this assay, and inadequate number of viral copies(<138 copies/mL). A negative result must be combined with clinical observations, patient history, and epidemiological information. The expected result is Negative.  Fact Sheet for Patients:  EntrepreneurPulse.com.au  Fact Sheet for Healthcare Providers:  IncredibleEmployment.be  This test is no t yet approved or cleared by the [place] FDA and  has been authorized for detection and/or diagnosis of SARS-CoV-2 by FDA under an Emergency Use Authorization (EUA). This EUA will remain  in effect (meaning this test can be used) for the duration of the COVID-19 declaration under Section 564(b)(1) of the Act, 21 U.S.C.section 360bbb-3(b)(1), unless the authorization is terminated  or revoked sooner.       Influenza A by PCR NEGATIVE NEGATIVE Final   Influenza B by PCR NEGATIVE NEGATIVE Final    Comment: (NOTE) The Xpert Xpress SARS-CoV-2/FLU/RSV plus assay is intended as an aid in the diagnosis of influenza from Nasopharyngeal swab specimens and should not be used as a sole basis for treatment. Nasal washings and aspirates are unacceptable for Xpert Xpress SARS-CoV-2/FLU/RSV testing.  Fact Sheet for Patients: EntrepreneurPulse.com.au  Fact Sheet for Healthcare Providers: IncredibleEmployment.be  This test is not yet approved or cleared by the [place] FDA and has been authorized for detection and/or diagnosis of SARS-CoV-2 by FDA under an Emergency Use Authorization (EUA). This EUA will remain in effect (meaning this test can be used) for the duration of the COVID-19 declaration under Section 564(b)(1) of the Act, 21 U.S.C. section 360bbb-3(b)(1), unless the authorization is terminated or revoked.  Performed at [facility], [place] [address], [place], [place] [address]   MRSA Next Gen by PCR, Nasal     Status: None   Collection Time: 09/02/21   7:14 PM   Specimen: Nasal Mucosa; Nasal Swab  Result Value Ref Range Status   MRSA by PCR Next Gen NOT DETECTED NOT DETECTED Final    Comment: (NOTE) The GeneXpert MRSA Assay (FDA approved for NASAL specimens only), is one component of a comprehensive MRSA colonization surveillance program. It is not intended to diagnose  MRSA infection nor to guide or monitor treatment for MRSA infections. Test performance is not FDA approved in patients less than 8 years old. Performed at [facility], [place] [address] [place], [place] [address]   Culture, blood (Routine X 2) w Reflex to ID Panel     Status: None   Collection Time: 09/02/21  9:08 PM   Specimen: BLOOD  Result Value Ref Range Status   Specimen Description BLOOD SITE NOT SPECIFIED  Final   Special Requests   Final    BOTTLES DRAWN AEROBIC AND ANAEROBIC Blood Culture adequate volume   Culture   Final    NO GROWTH 5 DAYS Performed at [facility], [address], [place], [place] [address]    Report Status 09/07/2021 FINAL  Final  Culture, blood (Routine X 2) w Reflex to ID Panel     Status: None   Collection Time: 09/02/21  9:08 PM   Specimen: BLOOD  Result Value Ref Range Status   Specimen Description BLOOD SITE NOT SPECIFIED  Final   Special Requests   Final    BOTTLES DRAWN AEROBIC AND ANAEROBIC Blood Culture adequate volume   Culture   Final    NO GROWTH 5 DAYS Performed at [facility], [place] [address] [place], [place] [address]    Report Status 09/07/2021 FINAL  Final  Culture, Respiratory w Gram Stain     Status: None   Collection Time: 09/04/21 11:16 AM   Specimen: Tracheal Aspirate; Respiratory  Result Value Ref Range Status   Specimen Description TRACHEAL ASPIRATE  Final   Special Requests NONE  Final   Gram Stain   Final    RARE WBC PRESENT, PREDOMINANTLY PMN RARE GRAM POSITIVE COCCI IN PAIRS RARE YEAST WITH PSEUDOHYPHAE Performed at [facility], [place] [address] [place], [place]  [address]    Culture FEW CANDIDA ALBICANS  Final   Report Status 09/07/2021 FINAL  Final  Culture, blood (Routine X 2) w Reflex to ID Panel     Status: None   Collection Time: 09/08/21  4:53 PM   Specimen: BLOOD  Result Value Ref Range Status   Specimen Description BLOOD RIGHT ANTECUBITAL  Final   Special Requests   Final    BOTTLES DRAWN AEROBIC AND ANAEROBIC Blood Culture results may not be optimal due to an inadequate volume of blood received in culture bottles   Culture   Final    NO GROWTH 5 DAYS Performed at [facility], [place] [address] [place], [place] [address]    Report Status 09/13/2021 FINAL  Final  Culture, blood (Routine X 2) w Reflex to ID Panel     Status: None   Collection Time: 09/08/21  4:53 PM   Specimen: BLOOD  Result Value Ref Range Status   Specimen Description BLOOD RIGHT ANTECUBITAL  Final   Special Requests IN PEDIATRIC BOTTLE Blood Culture adequate volume  Final   Culture   Final    NO GROWTH 5 DAYS Performed at [facility], [place] [address] [place], [place] [address]    Report Status 09/13/2021 FINAL  Final  Culture, Respiratory w Gram Stain     Status: None   Collection Time: 09/09/21 10:08 AM   Specimen: Tracheal Aspirate; Respiratory  Result Value Ref Range Status   Specimen Description TRACHEAL ASPIRATE  Final   Special Requests NONE  Final   Gram Stain   Final    ABUNDANT WBC  PRESENT, PREDOMINANTLY PMN FEW BUDDING YEAST SEEN Performed at [facility], [place] [address] [place], [place] [address]    Culture MODERATE CANDIDA ALBICANS  Final   Report Status 09/11/2021 FINAL  Final    Labs: CBC: Recent Labs  Lab 09/14/21 0251 09/15/21 0146 09/16/21 0223 09/17/21 0037 09/18/21 0046  WBC 8.3 9.1 12.5* 9.0 7.9  HGB 9.4* 9.5* 9.9* 10.1* 10.4*  HCT 29.3* 30.0* 29.7* 29.6* 31.9*  MCV 93.9 94.3 93.4 92.8 95.2  PLT 253 257 253 234 157   Basic Metabolic Panel: Recent Labs  Lab 09/13/21 0620 09/14/21 0251 09/15/21 0146  09/18/21 0046  NA 146* 143 143 141  K 4.2 4.1 4.4 4.0  CL 114* 109 111 107  CO2 27 26 25 29   GLUCOSE 122* 99 102* 132*  BUN 69* 53* 47* 51*  CREATININE 1.54* 1.33* 1.21 1.39*  CALCIUM 8.8* 9.0 9.0 8.8*  MG  --   --  2.0  --   PHOS  --   --  4.1  --    Liver Function Tests: Recent Labs  Lab 09/13/21 0620  AST 104*  ALT 128*  ALKPHOS 105  BILITOT 0.7  PROT 6.6  ALBUMIN 1.9*   CBG: Recent Labs  Lab 09/18/21 0614 09/18/21 1119 09/18/21 1637 09/18/21 2119 09/19/21 0613  GLUCAP 107* 138* 117* 159* 121*    Discharge time spent: approximately 40 minutes spent on discharge counseling, evaluation of patient on day of discharge, and coordination of discharge planning with nursing, social work, pharmacy and case management  Signed: Edwin Dada, MD [facility] 09/19/2021

## 2021-09-19 NOTE — Progress Notes (Signed)
Inpatient Rehabilitation Admission Medication Review by a Pharmacist  A complete drug regimen review was completed for this patient to identify any potential clinically significant medication issues.  High Risk Drug Classes Is patient taking? Indication by Medication  Antipsychotic Yes Compazine- N/V  Anticoagulant Yes Apixaban- PAF  Antibiotic No   Opioid Yes Tramadol- acute pain  Antiplatelet No   Hypoglycemics/insulin Yes Insulin, Jardiance- T2DM  Vasoactive Medication Yes Amiodarone, Coreg, Entresto, Flomax, Demadex- arrhythmia, rate control, HFpEF, BPH  Chemotherapy No   Other Yes Trazodone- sleep Melatonin- sleep Proscar- BPH Phenobarbital- seizure prophylaxis- disorder Pravachol- HLD Protonix- GERD     Type of Medication Issue Identified Description of Issue Recommendation(s)  Drug Interaction(s) (clinically significant)     Duplicate Therapy     Allergy     No Medication Administration End Date     Incorrect Dose     Additional Drug Therapy Needed     Significant med changes from prior encounter (inform family/care partners about these prior to discharge).    Other  PTA med: Zyrtec Voltaren gel Apresoline Prilosec Spironolactone Restart PTA meds when and if necessary during CIR admission or at time of discharge, if warranted     Clinically significant medication issues were identified that warrant physician communication and completion of prescribed/recommended actions by midnight of the next day:  No  Time spent performing this drug regimen review (minutes):  30  Meliss Fleek BS, PharmD, BCPS Clinical Pharmacist 09/19/2021 3:13 PM  Contact: [phone] after 3 PM  "Be curious, not judgmental..." -Jamal Maes

## 2021-09-19 NOTE — Progress Notes (Signed)
Inpatient Rehab Admissions Coordinator:    I have insurance approval and a bed available for pt to admit to CIR today. Dr. Loleta Books in agreement.  Will let pt/family and TOC team know.   Shann Medal, PT, DPT Admissions Coordinator [phone] 09/19/21  11:39 AM

## 2021-09-19 NOTE — Progress Notes (Addendum)
PMR Admission Coordinator Pre-Admission Assessment   Patient: Ricky Hayden is an 76 y.o., male MRN: [id number] DOB: Sep 29, 1945 Height: $RemoveBefo'5\' 7"'[id number]$  (170.2 cm) Weight: 103.9 kg   Insurance Information HMO:     PPO:      PCP:      IPA:      80/20:      OTHER:  PRIMARY: [place]     Policy#: [id number]      Subscriber: pt CM Name: Rubin Payor      Phone#: [phone]     Fax#: [phone] Pre-Cert#: [id number] [place] for CIR from [place] with [facility] with updates due to fax listed above on 10/19/21     Employer:  Benefits:  Phone #: [phone]     Name:  Eff. Date: 09/25/2017     Deduct: $0      Out of Pocket Max: $0      Life Max:  CIR: 100%      SNF: 100% Outpatient: 100%     Co-Pay:  Home Health: 100%      Co-Pay:  DME: 100%     Co-Pay:  Providers:  SECONDARY: Medicare Part A      Policy#: [id number]     Phone#:    Financial Counselor:       Phone#:    The "Data Collection Information Summary" for patients in Inpatient Rehabilitation Facilities with attached "Privacy Act [place] Records" was provided and verbally reviewed with: Patient and Family   Emergency Contact Information Contact Information       Name Relation Home Work Mobile    [place] Daughter     [phone]    [name] Daughter     [phone]    [place], Dungan [phone]               Current Medical History  Patient Admitting Diagnosis: debility    History of Present Illness: Pt is a 76 y/o male with PMH of combined systolic/diastolic CHF with ICD, atrial fibrillation, CKD III, COPD, and seizures, who presented to [facility] on 4/27 with worsening chills, fevers, and SOB.  He initially required bipap, tmax 103 in ED with UA showing features concerning for UTI so started on empiric antibiotics.  Cultures grew Proteus, ID consulted.  Pt developed septic shock with worsening respiratory status and fluid overload, transferred to ICU on 5/2 for intubation and had brief PEA arrest with quick ROSC.   Pt was extubated on 5/12.  Antibiotics completed.  Hospital course further complicated by AKI.  Therapy ongoing and recommendations are for CIR.    Patient's medical record from [facility] has been reviewed by the rehabilitation admission coordinator and physician.   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 Sherwood Gambler   Nocturia     Obesity     Pansinusitis      a.  complicated by brain abscess and bleeding requiring craniotomy in 1995.   PONV (postoperative nausea and vomiting)     Sinusitis      s/p ethmoidectomy and nasal septoplasty   Vertigo      intermitantly      Has the patient had major surgery during 100 days prior to admission? No   Family History   family history includes Heart attack (age of onset: 88) in his father; Heart failure (age of onset: 47) in his mother.   Current Medications   Current Facility-Administered Medications:    0.9 %  sodium chloride infusion, 250 mL, Intravenous, Continuous, Agarwala, Ravi, MD, Stopped at 09/11/21 0110   acetaminophen (TYLENOL) tablet 650 mg, 650 mg, Oral, Q6H PRN, 650 mg at 09/17/21 1156 **OR** acetaminophen (TYLENOL) suppository 650 mg, 650 mg, Rectal, Q6H PRN, Agarwala, Ravi, MD    albuterol (PROVENTIL) (2.5 MG/3ML) 0.083% nebulizer solution 2.5 mg, 2.5 mg, Nebulization, Q2H PRN, Agarwala, Ravi, MD   amiodarone (PACERONE) tablet 200 mg, 200 mg, Oral, Daily, Agarwala, Ravi, MD, 200 mg at 09/19/21 0856   apixaban (ELIQUIS) tablet 5 mg, 5 mg, Oral, BID, Agarwala, Ravi, MD, 5 mg at 09/19/21 0856   carvedilol (COREG) tablet 6.25 mg, 6.25 mg, Oral, BID WC, Agarwala, Ravi, MD, 6.25 mg at 09/19/21 0856   Chlorhexidine Gluconate Cloth 2 % PADS 6 each, 6 each, Topical, Daily, Agarwala, Ravi, MD, 6 each at 09/19/21 0858   feeding supplement (ENSURE ENLIVE / ENSURE PLUS) liquid 237 mL, 237 mL, Oral, BID BM, Danford, Christopher P, MD, 237 mL at 09/19/21 0858   finasteride (PROSCAR) tablet 2.5 mg, 2.5 mg, Oral, Daily, Danford, Christopher P, MD, 2.5 mg at 09/19/21 0857   insulin aspart (novoLOG) injection 0-20 Units, 0-20 Units, Subcutaneous, TID WC, Agarwala, Ravi, MD, 3 Units at 09/19/21 0624   insulin glargine-yfgn (SEMGLEE) injection 15 Units, 15 Units, Subcutaneous, Daily, Agarwala, Ravi, MD, 15 Units at 09/19/21 0857   melatonin tablet 3 mg, 3 mg, Oral, QHS, Laqueta Jean, MD, 3 mg at 09/18/21 2126   metoprolol tartrate (LOPRESSOR) injection 2.5 mg, 2.5 mg, Intravenous, Q6H PRN, Agarwala, Ravi, MD   mometasone-formoterol (DULERA) 100-5 MCG/ACT inhaler 2 puff, 2 puff, Inhalation, BID, Danford, Suann Larry, MD   multivitamin (RENA-VIT) tablet 1 tablet, 1 tablet, Oral, QHS, Danford, Suann Larry, MD, 1 tablet at 09/18/21 2126   pantoprazole (PROTONIX) EC tablet 40 mg, 40 mg, Oral, Daily, Agarwala, Ravi, MD, 40 mg at 09/19/21 0856   PHENobarbital (LUMINAL) tablet 64.8 mg, 64.8 mg, Oral, BID, Agarwala, Ravi, MD, 64.8 mg at 09/19/21 0856   pravastatin (PRAVACHOL) tablet 40 mg, 40 mg, Oral, QPM, Agarwala, Ravi, MD, 40 mg at 09/18/21 1643   sacubitril-valsartan (ENTRESTO) 24-26 mg per tablet, 1 tablet, Oral, BID, Barb Merino, MD, 1 tablet at 09/19/21 0856   senna (SENOKOT) tablet  8.6 mg, 1 tablet, Oral, QHS PRN, Agarwala, Ravi, MD   tamsulosin (FLOMAX) capsule 0.4 mg, 0.4 mg, Oral, QPC breakfast, Danford, Suann Larry, MD, 0.4 mg at 09/19/21 0856   torsemide (DEMADEX) tablet 20 mg, 20 mg, Oral, BID, Barb Merino, MD, 20 mg at  09/19/21 0856   traMADol (ULTRAM) tablet 50 mg, 50 mg, Oral, Q6H PRN, Danford, Suann Larry, MD   white petrolatum (VASELINE) gel, , Topical, PRN, Agarwala, Ravi, MD   Zinc Oxide (TRIPLE PASTE) 12.8 % ointment, , Topical, PRN, Danford, Suann Larry, MD, 1 application. at 09/18/21 2130   Patients Current Diet:  Diet Order                  DIET DYS 3 Room service appropriate? Yes; Fluid consistency: Thin  Diet effective now                         Precautions / Restrictions Precautions Precautions: Fall, Other (comment) Precaution Comments: urine incontinence Restrictions Weight Bearing Restrictions: No    Has the patient had 2 or more falls or a fall with injury in the past year? No   Prior Activity Level Limited Community (1-2x/wk): mod I with rollator, driving   Prior Functional Level Self Care: Did the patient need help bathing, dressing, using the toilet or eating? Independent   Indoor Mobility: Did the patient need assistance with walking from room to room (with or without device)? Independent   Stairs: Did the patient need assistance with internal or external stairs (with or without device)? Independent   Functional Cognition: Did the patient need help planning regular tasks such as shopping or remembering to take medications? Independent   Patient Information Are you of Hispanic, Latino/a,or Spanish origin?: A. No, not of Hispanic, Latino/a, or Spanish origin What is your race?: A. White Do you need or want an interpreter to communicate with a doctor or health care staff?: 0. No   Patient's Response To:  Health Literacy and Transportation Is the patient able to respond to health literacy and transportation  needs?: Yes Health Literacy - How often do you need to have someone help you when you read instructions, pamphlets, or other written material from your doctor or pharmacy?: Never In the past 12 months, has lack of transportation kept you from medical appointments or from getting medications?: No In the past 12 months, has lack of transportation kept you from meetings, work, or from getting things needed for daily living?: No   Development worker, international aid / [place] Devices/Equipment: None Home Equipment: Rollator (4 wheels), Hospital bed, Shower seat   Prior Device Use: Indicate devices/aids used by the patient prior to current illness, exacerbation or injury? Walker   Current Functional Level Cognition   Overall Cognitive Status: No family/caregiver present to determine baseline cognitive functioning Difficult to assess due to: Level of arousal Current Attention Level: Sustained Orientation Level: Oriented to person, Oriented to place, Disoriented to time, Disoriented to situation Following Commands: Follows one step commands inconsistently, Follows one step commands with increased time Safety/Judgement: Decreased awareness of safety, Decreased awareness of deficits General Comments: pt seemingly joking appropriately, but suspect he may be using humor to mask cognitive impairment - difficult to determine extent of this versus fatigue. apparent slowed processing, motor planning deficits(?). difficult to determine if pt truly recalls details of yesterday's PT session since he is not forthcoming with words, but acts as if he remembers    Extremity Assessment (includes Sensation/Coordination)   Upper Extremity Assessment: RUE deficits/detail, LUE deficits/detail RUE Deficits / Details: Edema with chronic numbness to RT D5 since 04/2021. AAROM: WFL. MMT: shoulder 2+/5, elbow: 3-/5, wrist 3-/5, grip: 3+/5 but limited composite grip from edema. RUE Sensation: decreased light touch RUE  Coordination:  decreased fine motor (Opposition absent to D5) LUE Deficits / Details: Edema on LT greater than RT. Chronic numbness to LT D5 since 04/2021.  AAROM: WFL limiting shoulder to ~120 due to neck lateral LT flexion blocking further shoulder movement. MMT: Shoulder 2/5, elbow: 2+/5, wrist 2+/5, grip: 3/5 with limited composite grip due to edema. LUE: Shoulder pain with ROM LUE Sensation: decreased light touch LUE Coordination: decreased fine motor, decreased gross motor (Opposition absent to D4 and D5)  Lower Extremity Assessment: Defer to PT evaluation RLE Deficits / Details: AAROM hip/knee flexion (supine) to 90 degrees with wincing at end ROM x 5 reps; limited hip IR and abduction; ankle DF to neutral with stretch; strength grossly 2+/5 LLE Deficits / Details: AAROM hip/knee flexion (supine) to 100 degrees x 5 reps; limited hip IR and abduction; ankle DF to 10* with stretch; strength grossly 2+/5     ADLs   Overall ADL's : Needs assistance/impaired Eating/Feeding: Moderate assistance, Bed level Eating/Feeding Details (indicate cue type and reason): May benefit from adaptive utensils Grooming: Wash/dry face, Supervision/safety Grooming Details (indicate cue type and reason): Pt able to wash his face sitting EOB when presented with washcloth. Upper Body Bathing: Maximal assistance, Bed level Lower Body Bathing: +2 for physical assistance, Bed level, Total assistance Upper Body Dressing : Maximal assistance, Bed level Lower Body Dressing: Total assistance Lower Body Dressing Details (indicate cue type and reason): total assist for donning gripper socks Toilet Transfer: +2 for physical assistance, Maximal assistance, Stand-pivot Toilet Transfer Details (indicate cue type and reason): simulated stand pivot Toileting- Clothing Manipulation and Hygiene: +2 for physical assistance, Maximal assistance, Sit to/from stand Toileting - Clothing Manipulation Details (indicate cue type and  reason): Currently with flexiseal. General ADL Comments: Pt able to transfer to the EOB with max assist +2.  Once on the EOB, he was able to maintain sitting balance during grooming tasks at min guard assist.  Total +2 for sit to stand intervals of 15 to approximately 45 seconds using bilaterals therapists for support as well as RW.  Total +2 for use of the Stedy secondary to decreased initiation for sit to stand.  Oxygen sats maintained at 92% or greater on room air throughout session.     Mobility   Overal bed mobility: Needs Assistance Bed Mobility: Supine to Sit Rolling: Mod assist, +2 for physical assistance Sidelying to sit: +2 for physical assistance, Max assist Supine to sit: Mod assist, +2 for physical assistance Sit to supine: +2 for physical assistance, Total assist General bed mobility comments: received sitting in recliner (transferred via maximove with nursing)     Transfers   Overall transfer level: Needs assistance Equipment used: Rolling walker (2 wheels) Transfers: Sit to/from Stand Sit to Stand: Mod assist, +2 physical assistance, Max assist Bed to/from chair/wheelchair/BSC transfer type:: Via Lift equipment Stand pivot transfers: Mod assist (had one episode where both knees almost buckled (he twitched)) Transfer via Lift Equipment: Bradley transfer comment: Multiple sit<>stands from recliner to RW with modA+2 to maxA+1, physical assist and verbal cues for BUE/BLE placement, assist for eccentric lowering     Ambulation / Gait / Stairs / Wheelchair Mobility   Ambulation/Gait Ambulation/Gait assistance: +2 safety/equipment, Mod assist, Max assist Gait Distance (Feet): 2 Feet Assistive device: Rolling walker (2 wheels) Gait Pattern/deviations: Step-to pattern, Decreased weight shift to left, Shuffle, Antalgic, Trunk flexed General Gait Details: pt able to take 4-5 shufflings steps forward with RW and mod-maxA to maintain upright posture; pt inconsistent with ability  to weight shift and maintain trunk extension; difficulty accepting weight onto LLE in order to take complete step with R foot; increasing R lateral lean with fatigue; +2 assist for chair follow Gait velocity: decreased Pre-gait activities: weight shifts in stedy frame     Posture / Balance Dynamic Sitting Balance Sitting balance - Comments: min guard for static sitting Balance Overall balance assessment: Needs assistance Sitting-balance support: Feet supported, Bilateral upper extremity supported, No upper extremity supported Sitting balance-Leahy Scale: Fair Sitting balance - Comments: min guard for static sitting Standing balance support: During functional activity, Bilateral upper extremity supported Standing balance-Leahy Scale: Poor Standing balance comment: reliant on BUE support and external assist     Special needs/care consideration Oxygen 2L noc and Diabetic management yes    Previous Home Environment (from acute therapy documentation) Living Arrangements: Alone (adult children stay nights and weekends)  Lives With: Daughter, Son Available Help at Discharge: Family, Available 24 hours/day (daugher available 24/7) Type of Home: House Home Layout: One level Home Access: Stairs to enter Entrance Stairs-Rails: None Technical brewer of Steps: 3 Bathroom Shower/Tub: Gaffer, Chiropodist: Handicapped height Bathroom Accessibility: Yes How Accessible: Accessible via walker Wadley: No   Discharge Living Setting Plans for Discharge Living Setting: Patient's home Type of Home at Discharge: House Discharge Home Layout: One level Discharge Home Access: Stairs to enter Entrance Stairs-Rails: None Entrance Stairs-Number of Steps: 3 Discharge Bathroom Shower/Tub: Walk-in shower, Tub/shower unit Discharge Bathroom Toilet: Handicapped height Discharge Bathroom Accessibility: Yes How Accessible: Accessible via walker Does the patient have  any problems obtaining your medications?: No   Social/Family/Support Systems Patient Roles: Parent (4 adult children) Contact Information: [phone] Anticipated Caregiver: [name] and [name] (daughters) are primary contact, will rotate between family and [facility] caregivers Anticipated Caregiver's Contact Information: [name] [phone] Ability/Limitations of Caregiver: supervision to min assist Caregiver Availability: 24/7 Discharge Plan Discussed with Primary Caregiver: Yes Is Caregiver In Agreement with Plan?: Yes Does Caregiver/Family have Issues with Lodging/Transportation while Pt is in Rehab?: No   Goals Patient/Family Goal for Rehab: PT/OT supervision to min assist, SLP supervision Expected length of stay: 14-16 days Additional Information: would benefit from neuropsych for coping Pt/Family Agrees to Admission and willing to participate: Yes Program Orientation Provided & Reviewed with Pt/Caregiver Including Roles  & Responsibilities: Yes  Barriers to Discharge: Insurance for SNF coverage, Home environment access/layout, Decreased caregiver support  Barriers to Discharge Comments: family hoping to pull together 24/7 (initially at minimum) between family and [facility] caregivers   Decrease burden of Care through IP rehab admission: no   Possible need for SNF placement upon discharge: not anticipated.  Pt's daughters working to pull together 24/7 support between family and [facility] caregivers, if needed, at discharge.    Patient Condition: I have reviewed medical records from [facility], spoken with CM, and patient and daughter. I met with patient at the bedside and discussed via phone for inpatient rehabilitation assessment.  Patient will benefit from ongoing PT, OT, and SLP, can actively participate in 3 hours of therapy a day 5 days of the week, and can make measurable gains during the admission.  Patient will also benefit from the coordinated team approach during an Inpatient  Acute Rehabilitation admission.  The patient will receive intensive therapy as well as Rehabilitation physician, nursing, social worker, and care management interventions.  Due to bladder management, bowel management, safety, skin/wound care, disease management, medication administration, pain management, and patient education the patient requires 61  hour a day rehabilitation nursing.  The patient is currently mod assist to max assist +2 with mobility and basic ADLs.  Discharge setting and therapy post discharge at home with home health is anticipated.  Patient has agreed to participate in the Acute Inpatient Rehabilitation Program and will admit today.   Preadmission Screen Completed By:  Michel Santee, PT, DPT 09/19/2021 11:21 AM ______________________________________________________________________   Discussed status with Dr. Ranell Patrick on 09/19/21  at 11:21 AM  and received approval for admission today.   Admission Coordinator:  Michel Santee, PT, DPT time 11:21 AM Sudie Grumbling 09/19/21     Assessment/Plan: Diagnosis: Debility secondary to septic shock Does the need for close, 24 hr/day Medical supervision in concert with the patient's rehab needs make it unreasonable for this patient to be served in a less intensive setting? Yes Co-Morbidities requiring supervision/potential complications: arthritis, ascending aorta dilatation, recurrent balanitis, cardiac arhythmia, chronic joint pain Due to bladder management, bowel management, safety, skin/wound care, disease management, medication administration, pain management, and patient education, does the patient require 24 hr/day rehab nursing? Yes Does the patient require coordinated care of a physician, rehab nurse, PT, OT to address physical and functional deficits in the context of the above medical diagnosis(es)? Yes Addressing deficits in the following areas: balance, endurance, locomotion, strength, transferring, bowel/bladder control, bathing,  dressing, feeding, grooming, toileting, and psychosocial support Can the patient actively participate in an intensive therapy program of at least 3 hrs of therapy 5 days a week? Yes The potential for patient to make measurable gains while on inpatient rehab is good Anticipated functional outcomes upon discharge from inpatient rehab: min assist PT, min assist OT, independent SLP Estimated rehab length of stay to reach the above functional goals is: 12-16 days Anticipated discharge destination: Home 10. Overall Rehab/Functional Prognosis: excellent     MD Signature: Leeroy Cha, MD

## 2021-09-19 NOTE — Progress Notes (Signed)
Inpatient Rehab Admissions Coordinator:   Awaiting insurance determination regarding CIR prior auth request.  Will follow.   Shann Medal, PT, DPT Admissions Coordinator [phone] 09/19/21  9:58 AM

## 2021-09-19 NOTE — Plan of Care (Signed)
  Problem: Education: Goal: Knowledge of General Education information will improve Description Including pain rating scale, medication(s)/side effects and non-pharmacologic comfort measures Outcome: Progressing   Problem: Clinical Measurements: Goal: Will remain free from infection Outcome: Progressing Goal: Respiratory complications will improve Outcome: Progressing Goal: Cardiovascular complication will be avoided Outcome: Progressing   

## 2021-09-19 NOTE — Progress Notes (Signed)
Brief Palliative Medicine Progress Note:  Chart reviewed for PMT follow up. Patient is scheduled for transfer to CIR.   Called daughter/Amanda - emotional support provided. She and family are still interested in having patient and family meeting over the weekend to discuss diagnosis, GOC, disposition, and options.   Family meeting scheduled for Sunday 5/21 at 2:30pm.  Please ensure PMT consult follows patient to CIR.   All questions answered. Encouraged to call with questions/concerns. PMT number previously provided.  Thank you for allowing PMT to assist in the care of this patient.  Joaovictor Krone M. Tam[facility] Palliative Medicine Team Team Phone: [phone] NO CHARGE

## 2021-09-19 NOTE — Progress Notes (Signed)
Occupational Therapy Treatment Patient Details Name: Ricky Hayden MRN: [id number] DOB: 09-17-1945 Today's Date: 09/19/2021   History of present illness Pt is a 76 y.o. male admitted 08/28/21 with fever, chills, SOB. Workup for sepsis secondary to nonobstructing nephrolithiasis/pyelonephritis and concurrent UTI, acute hypoxic respiratory failure secondary to volume overload, AKI on CKD. Course complicated by decline in respiratory status 5/3; ETT 5/3-5/12. PMH includes CHF, ICD, CKF, afib, COPD, seizures.   OT comments  Pt completed UB bathing sitting unsupported EOB with min assist.  He needs max +2 for LB selfcare sit to stand.  Decreased awareness and slower cognitive processing is still present as well.  Oxygen sats remained greater than 92% on room air throughout session.  Will continue to benefit from acute care OT to progress ADL independence with hopeful transition to AIR for more aggressive therapy.      Recommendations for follow up therapy are one component of a multi-disciplinary discharge planning process, led by the attending physician.  Recommendations may be updated based on patient status, additional functional criteria and insurance authorization.    Follow Up Recommendations  Acute inpatient rehab (3hours/day)    Assistance Recommended at Discharge Frequent or constant Supervision/Assistance  Patient can return home with the following  Assistance with cooking/housework;Help with stairs or ramp for entrance;Direct supervision/assist for financial management;Direct supervision/assist for medications management;Two people to help with walking and/or transfers;Two people to help with bathing/dressing/bathroom;Assist for transportation   Equipment Recommendations  Other (comment) (TBD next venue of care)       Precautions / Restrictions Precautions Precautions: Fall;Other (comment) Restrictions Weight Bearing Restrictions: No       Mobility Bed Mobility Overal bed  mobility: Needs Assistance Bed Mobility: Supine to Sit Rolling: Max assist              Transfers Overall transfer level: Needs assistance   Transfers: Sit to/from Stand Sit to Stand: Max assist, +2 safety/equipment Stand pivot transfers: Max assist, +2 physical assistance         General transfer comment: Pt unable to advance his LEs during stand pivot transfer without an assistive device. Therapist had to assist with moving his foot with each small step.     Balance Overall balance assessment: Needs assistance Sitting-balance support: Feet supported, Bilateral upper extremity supported, No upper extremity supported Sitting balance-Leahy Scale: Fair Sitting balance - Comments: min guard to min assist for static sitting balance   Standing balance support: During functional activity, Bilateral upper extremity supported Standing balance-Leahy Scale: Poor                             ADL either performed or assessed with clinical judgement   ADL Overall ADL's : Needs assistance/impaired     Grooming: Wash/dry face;Min guard;Wash/dry hands Grooming Details (indicate cue type and reason): sitting EOB unsupported Upper Body Bathing: Minimal assistance;Sitting Upper Body Bathing Details (indicate cue type and reason): sitting unsupported EOB Lower Body Bathing: +2 for physical assistance;Maximal assistance   Upper Body Dressing : Sitting;Moderate assistance Upper Body Dressing Details (indicate cue type and reason): for donning hospital gown only     Toilet Transfer: Maximal assistance;+2 for physical assistance;Stand-pivot Toilet Transfer Details (indicate cue type and reason): simulated Toileting- Clothing Manipulation and Hygiene: +2 for safety/equipment;Maximal assistance         General ADL Comments: Pt able to sit EOB and work on bathing and dressing sit to stand.  Increased right lean/pushing noted with  posterior pelvic tilt.  Pt resistant to therapist  trying to help get him back to midline by pushing with the LUE on the bed.  Eventually, this improved slightly.  Oxygen sats 92% or greater on room air throughout activity.               Cognition Arousal/Alertness: Awake/alert Behavior During Therapy: Flat affect Overall Cognitive Status: Impaired/Different from baseline Area of Impairment: Attention, Problem solving, Awareness, Safety/judgement                   Current Attention Level: Sustained Memory: Decreased short-term memory Following Commands: Follows one step commands with increased time Safety/Judgement: Decreased awareness of deficits, Decreased awareness of safety Awareness: Intellectual Problem Solving: Slow processing, Difficulty sequencing, Requires verbal cues, Requires tactile cues, Decreased initiation General Comments: Pt needing increased time to process what he wanted to order for lunch, even when given choices.  He still was unable to choose within an appropriate period of time exhibiting greater than 20-30 second delay.                   Pertinent Vitals/ Pain       Pain Assessment Pain Assessment: Faces Pain Location: back Pain Descriptors / Indicators: Discomfort Pain Intervention(s): Monitored during session         Frequency  Min 2X/week        Progress Toward Goals  OT Goals(current goals can now be found in the care plan section)  Progress towards OT goals: Progressing toward goals  Acute Rehab OT Goals Patient Stated Goal: Pt agreeable to therapy this session. OT Goal Formulation: With patient/family Time For Goal Achievement: 09/27/21 Potential to Achieve Goals: Good  Plan Discharge plan remains appropriate       AM-PAC OT "6 Clicks" Daily Activity     Outcome Measure   Help from another person eating meals?: A Little Help from another person taking care of personal grooming?: A Little Help from another person toileting, which includes using toliet, bedpan, or  urinal?: A Lot Help from another person bathing (including washing, rinsing, drying)?: A Lot Help from another person to put on and taking off regular upper body clothing?: A Lot Help from another person to put on and taking off regular lower body clothing?: A Lot 6 Click Score: 14    End of Session Equipment Utilized During Treatment: Gait belt;Other (comment)  OT Visit Diagnosis: Unsteadiness on feet (R26.81);Other abnormalities of gait and mobility (R26.89);Muscle weakness (generalized) (M62.81);Other symptoms and signs involving cognitive function   Activity Tolerance Patient tolerated treatment well   Patient Left with call bell/phone within reach;in chair;with chair alarm set   Nurse Communication Mobility status        Time: 1000-1044 OT Time Calculation (min): 44 min  Charges: OT General Charges $OT Visit: 1 Visit OT Treatments $Self Care/Home Management : 38-52 mins  Arsenia Goracke OTR/L 09/19/2021, 12:45 PM

## 2021-09-19 NOTE — H&P (Deleted)
Physical Medicine and Rehabilitation Admission H&P    Chief Complaint  Patient presents with   Debility due to multiple medical issues.     HPI: Ricky Hayden is a 76 year old male with history of CKD- baseline SCr- 1.5, COPD, CAD/cardiac arrhthymias s/p ICD/PPM, A fib- on eliquis, seizure disorder (on phenobarbital), recent UTI  who was admitted on 08/29/21 with fever T-103, hematuria and chills due urospesis. He was started on IV antibiotics. CT abdomen showed distended bladder with non-obstructing renal stones and subcentimeter exophytic lesion on left kidney. Dr. Cain Sieve recommended bladder scan to monitor voiding, foley for retention and cysto on outpatient basis. BC positive for Proteus with ongoing fevers and ceftriaxone broadened to Cefazolin X 14 days per Dr. Linus Salmons.   On 05/02, he had decrease in LOC with increase in oxygen requirement and required intubation as well as brief PEA arrest after intubation. CXR showed right infiltrate concerning for aspiration event with HCAP.   Hypotension due to septic shock treated with fluid bolus and pressors. He had decrease in UOP as well as concerns of overload treated with and had worsening of renal status. Acute on chronic renal failure felt to be due to ATN secondary to  severe UTI/sepsis/shock per Dr. Posey Pronto.  EEG was negative for seizure. CT head negative for acute changes. Antibiotics transitioned to Ampicillin sulbactam with end date 5/10. He tolerated extubation by 05/12 and placed on D3, thins due to prolonged oral phase/mastication.  AKI resolved with renal function at baseline and nephrology signed off. Stress dose steriods weaned off with improvement in BS. Delirium is resolving but he continues to have delay in processing, decreased awareness of deficits, fatigue, generalized weakness and poor balance with decrease in postural reaction. PT/OT working with patient and CIR recommended due to functional decline. Currently complains of shortness  of breath.   Review of Systems  Constitutional:  Negative for chills and fever.  HENT:  Negative for hearing loss and tinnitus.   Eyes:  Negative for blurred vision.  Respiratory:  Positive for shortness of breath.   Cardiovascular:  Negative for chest pain and leg swelling.  Gastrointestinal:  Negative for heartburn and nausea.  Genitourinary:  Positive for frequency. Negative for dysuria.  Musculoskeletal:  Positive for myalgias (hurts all over).  Neurological:  Positive for weakness. Negative for dizziness and headaches.  Psychiatric/Behavioral:  Positive for memory loss.        Has had issues with disorientation at nights with gait disorder.     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 Sherwood Gambler   Nocturia    Obesity    Pansinusitis    a.  complicated by brain abscess and bleeding requiring craniotomy in 1995.   PONV (postoperative nausea and vomiting)    Sinusitis    s/p ethmoidectomy and nasal septoplasty   Vertigo    intermitantly    Past Surgical History:  Procedure Laterality Date   BIV UPGRADE N/A 01/29/2020   Procedure: BIV UPGRADE;  Surgeon: Evans Lance, MD;  Location: [facility];  Service: Cardiovascular;  Laterality: N/A;   CIRCUMCISION N/A 11/20/2013    Procedure: CIRCUMCISION ADULT;  Surgeon: Claybon Jabs, MD;  Location: [facility];  Service: Urology;  Laterality: N/A;   [place]   hematomy due to sinus infection    PACEMAKER IMPLANT N/A 01/17/2020   Procedure: PACEMAKER IMPLANT;  Surgeon: Deboraha Sprang, MD;  Location: [facility];  Service: Cardiovascular;  Laterality: N/A;   RIGHT/LEFT HEART CATH AND CORONARY ANGIOGRAPHY N/A 09/20/2019   Procedure: RIGHT/LEFT HEART CATH AND CORONARY ANGIOGRAPHY;  Surgeon: Jolaine Artist, MD;  Location: [facility];  Service: Cardiovascular;  Laterality: N/A;   shoulder surg rt   1995   SINUS SURGERY WITH INSTATRAK     ethmoidectomy and nasal septum repair    Family History  Problem Relation Age of Onset   Heart failure Mother 40   Heart attack Father 47    Social History:  Widowed--children have been spending nights for past 2 years for safety. Has four children in town who provide meals/home management.  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 He  reports that he quit smoking about 35 years ago. He has never used smokeless tobacco. He reports that he does not drink alcohol and does not use drugs.   Allergies  Allergen Reactions   Calcium Channel Blockers Other (See Comments)    Came to hospital in 1995-caused chest pain     Medications Prior to Admission  Medication Sig Dispense Refill   acetaminophen (TYLENOL) 500 MG tablet Take 1,000 mg by mouth every 6 (six) hours as needed for moderate pain or headache.     albuterol (PROVENTIL HFA;VENTOLIN HFA) 108 (90 BASE) MCG/ACT inhaler Inhale 2 puffs into the lungs every 6 (six) hours as needed for wheezing. 1 Inhaler 11   amiodarone (PACERONE) 200 MG tablet Take 1 tablet (200 mg total) by mouth daily. 90 tablet 3   apixaban (ELIQUIS) 5 MG TABS tablet Take 1 tablet by mouth in the morning and at bedtime.     budesonide-formoterol (SYMBICORT) 80-4.5 MCG/ACT inhaler  Inhale 2 puffs into the lungs 2 (two) times daily. 1 Inhaler 12   carvedilol (COREG) 6.25 MG tablet Take 1 tablet (6.25 mg total) by mouth 2 (two) times daily with a meal. 180 tablet 3   cetirizine (ZYRTEC) 10 MG tablet Take 1 tablet by mouth daily.     diclofenac Sodium (VOLTAREN) 1 % GEL Apply 2 g topically daily as needed (pain).      docusate sodium (COLACE) 100 MG capsule Take 1-2 capsules (100-200 mg total) by mouth 2 (two) times daily as needed for mild constipation. 180 capsule 3   empagliflozin (JARDIANCE) 10 MG TABS tablet Take 1 tablet (10 mg total) by mouth daily before breakfast. 90 tablet 3   feeding supplement (ENSURE ENLIVE / ENSURE PLUS) LIQD Take 237 mLs by mouth 2 (two) times daily between meals. 237 mL 12   finasteride (PROSCAR) 5 MG tablet TAKE ONE TABLET BY MOUTH ONCE DAILY (Patient taking differently: Take 5 mg by mouth daily.) 30 tablet  11   GINKGO BILOBA PO Take 2 tablets by mouth at bedtime.     [START ON 09/20/2021] insulin glargine-yfgn (SEMGLEE) 100 UNIT/ML injection Inject 0.08 mLs (8 Units total) into the skin daily. 10 mL 11   Multiple Vitamins-Minerals (ICAPS AREDS 2 PO) Take 2 tablets by mouth daily.     omeprazole (PRILOSEC) 20 MG capsule Take 1 capsule (20 mg total) by mouth daily. 30 capsule 11   PHENobarbital (LUMINAL) 64.8 MG tablet Take 1 tablet (64.8 mg total) by mouth 2 (two) times daily. 180 tablet 3   polyethylene glycol (MIRALAX / GLYCOLAX) packet Take 17 g by mouth daily as needed for moderate constipation. 14 each 0   pravastatin (PRAVACHOL) 40 MG tablet Take 1 tablet (40 mg total) by mouth every evening. 90 tablet 3   sacubitril-valsartan (ENTRESTO) 24-26 MG Take 1 tablet by mouth 2 (two) times daily. 180 tablet 3   SPIRIVA HANDIHALER 18 MCG inhalation capsule INHALE ONE DOSE BY MOUTH ONCE DAILY (Patient not taking: Reported on 08/29/2021) 30 capsule 11   tamsulosin (FLOMAX) 0.4 MG CAPS capsule Take 1 capsule (0.4 mg total) by mouth daily. 90 capsule 3    torsemide (DEMADEX) 20 MG tablet Take 1 tablet (20 mg total) by mouth 2 (two) times daily.     traMADol (ULTRAM) 50 MG tablet Take 1 tablet (50 mg total) by mouth every 6 (six) hours as needed for moderate pain. 30 tablet    Home: Home Living Family/patient expects to be discharged to:: Private residence Living Arrangements: Alone (adult children stay nights and weekends) Available Help at Discharge: Family, Available 24 hours/day (daugher available 24/7) Type of Home: House Home Access: Stairs to enter [employer] of Steps: 3 Entrance Stairs-Rails: None Home Layout: One level Bathroom Shower/Tub: Gaffer, Chiropodist: Handicapped height Bathroom Accessibility: Yes Home Equipment: Radiation protection practitioner (4 wheels), Hospital bed, Shower seat  Lives With: Daughter, Son   Functional History: Prior Function Prior Level of Function : Needs assist Mobility Comments: uses rollator for ambulation in community if longer distance; walks without device in home and short community distances, children mostly manage IADLs ADLs Comments: Needs help sometimes per report. Drives sometimes.   Functional Status:  Mobility: Bed Mobility Overal bed mobility: Needs Assistance Bed Mobility: Supine to Sit Rolling: Max assist Sidelying to sit: +2 for physical assistance, Max assist Supine to sit: Mod assist, +2 for physical assistance Sit to supine: +2 for physical assistance, Total assist General bed mobility comments: received sitting in recliner (transferred via maximove with nursing) Transfers Overall transfer level: Needs assistance Equipment used: Rolling walker (2 wheels) Transfers: Sit to/from Stand Sit to Stand: Max assist, +2 safety/equipment Bed to/from chair/wheelchair/BSC transfer type:: Stand pivot Stand pivot transfers: Max assist, +2 physical assistance Transfer via Lift Equipment: Stedy General transfer comment: Pt unable to advance his LEs during stand  pivot transfer without an assistive device. Therapist had to assist with moving his foot with each small step. Ambulation/Gait Ambulation/Gait assistance: +2 safety/equipment, Mod assist, Max assist Gait Distance (Feet): 2 Feet Assistive device: Rolling walker (2 wheels) Gait Pattern/deviations: Step-to pattern, Decreased weight shift to left, Shuffle, Antalgic, Trunk flexed General Gait Details: pt able to take 4-5 shufflings steps forward with RW and mod-maxA to maintain upright posture; pt inconsistent with ability to weight shift and maintain trunk extension; difficulty accepting weight onto LLE in order to take complete step with R foot; increasing R lateral lean with fatigue; +2 assist for chair follow Gait velocity:  decreased Pre-gait activities: weight shifts in stedy frame   ADL: ADL Overall ADL's : Needs assistance/impaired Eating/Feeding: Moderate assistance, Bed level Eating/Feeding Details (indicate cue type and reason): May benefit from adaptive utensils Grooming: Wash/dry face, Min guard, Dealer Details (indicate cue type and reason): sitting EOB unsupported Upper Body Bathing: Minimal assistance, Sitting Upper Body Bathing Details (indicate cue type and reason): sitting unsupported EOB Lower Body Bathing: +2 for physical assistance, Maximal assistance Upper Body Dressing : Sitting, Moderate assistance Upper Body Dressing Details (indicate cue type and reason): for donning hospital gown only Lower Body Dressing: Total assistance Lower Body Dressing Details (indicate cue type and reason): total assist for donning gripper socks Toilet Transfer: Maximal assistance, +2 for physical assistance, Stand-pivot Toilet Transfer Details (indicate cue type and reason): simulated Toileting- Clothing Manipulation and Hygiene: +2 for safety/equipment, Maximal assistance Toileting - Clothing Manipulation Details (indicate cue type and reason): Currently with  flexiseal. General ADL Comments: Pt able to sit EOB and work on bathing and dressing sit to stand.  Increased right lean/pushing noted with posterior pelvic tilt.  Pt resistant to therapist trying to help get him back to midline by pushing with the LUE on the bed.  Eventually, this improved slightly.  Oxygen sats 92% or greater on room air throughout activity.   Cognition: Cognition Overall Cognitive Status: Impaired/Different from baseline Orientation Level: Oriented to person, Oriented to place, Disoriented to time, Disoriented to situation Cognition Arousal/Alertness: Awake/alert Behavior During Therapy: Flat affect Overall Cognitive Status: Impaired/Different from baseline Area of Impairment: Attention, Problem solving, Awareness, Safety/judgement Orientation Level: Disoriented to, Time Current Attention Level: Sustained Memory: Decreased short-term memory Following Commands: Follows one step commands with increased time Safety/Judgement: Decreased awareness of deficits, Decreased awareness of safety Awareness: Intellectual Problem Solving: Slow processing, Difficulty sequencing, Requires verbal cues, Requires tactile cues, Decreased initiation General Comments: Pt needing increased time to process what he wanted to order for lunch, even when given choices.  He still was unable to choose within an appropriate period of time exhibiting greater than 20-30 second delay. Difficult to assess due to: Level of arousal  Blood pressure 103/61, pulse 75, temperature 98.2 F (36.8 C), resp. rate 18, SpO2 100 %. Physical Exam Vitals and nursing note reviewed. Initially sleeping in chair bent forward HENT:     Head:     Comments: Well healed old crani incision.  Pulmonary:     Comments: Decreased breath sounds.  Abdominal:     General: There is distension.     Tenderness: There is no abdominal tenderness.  Neurological:     Mental Status: He is easily aroused. He is lethargic and  disoriented.     Comments: Oriented to self, place, age but thought it was October and persevered on 75 as year of birth. Limited by lethargy but able to follow simple motor commands.    MSK: protracted posture. No focal deficits.  Results for orders placed or performed during the hospital encounter of 08/28/21 (from the past 48 hour(s))  Glucose, capillary     Status: Abnormal   Collection Time: 09/17/21  4:36 PM  Result Value Ref Range   Glucose-Capillary 133 (H) 70 - 99 mg/dL    Comment: Glucose reference range applies only to samples taken after fasting for at least 8 hours.  Glucose, capillary     Status: Abnormal   Collection Time: 09/17/21  9:09 PM  Result Value Ref Range   Glucose-Capillary 118 (H) 70 - 99 mg/dL  Comment: Glucose reference range applies only to samples taken after fasting for at least 8 hours.   Comment 1 Notify RN    Comment 2 Document in Chart   CBC     Status: Abnormal   Collection Time: 09/18/21 12:46 AM  Result Value Ref Range   WBC 7.9 4.0 - 10.5 K/uL   RBC 3.35 (L) 4.22 - 5.81 MIL/uL   Hemoglobin 10.4 (L) 13.0 - 17.0 g/dL   HCT 31.9 (L) 39.0 - 52.0 %   MCV 95.2 80.0 - 100.0 fL   MCH 31.0 26.0 - 34.0 pg   MCHC 32.6 30.0 - 36.0 g/dL   RDW 13.2 11.5 - 15.5 %   Platelets 227 150 - 400 K/uL   nRBC 0.0 0.0 - 0.2 %    Comment: Performed at [facility], [place] [address], [place], [place] [address]  Basic metabolic panel     Status: Abnormal   Collection Time: 09/18/21 12:46 AM  Result Value Ref Range   Sodium 141 135 - 145 mmol/L   Potassium 4.0 3.5 - 5.1 mmol/L   Chloride 107 98 - 111 mmol/L   CO2 29 22 - 32 mmol/L   Glucose, Bld 132 (H) 70 - 99 mg/dL    Comment: Glucose reference range applies only to samples taken after fasting for at least 8 hours.   BUN 51 (H) 8 - 23 mg/dL   Creatinine, Ser 1.39 (H) 0.61 - 1.24 mg/dL   Calcium 8.8 (L) 8.9 - 10.3 mg/dL   GFR, Estimated 52 (L) >60 mL/min    Comment: (NOTE) Calculated using the CKD-EPI  Creatinine Equation (2021)    Anion gap 5 5 - 15    Comment: Performed at [facility] [address] [place], [place] [address]  Glucose, capillary     Status: Abnormal   Collection Time: 09/18/21  6:14 AM  Result Value Ref Range   Glucose-Capillary 107 (H) 70 - 99 mg/dL    Comment: Glucose reference range applies only to samples taken after fasting for at least 8 hours.   Comment 1 Notify RN    Comment 2 Document in Chart   Glucose, capillary     Status: Abnormal   Collection Time: 09/18/21 11:19 AM  Result Value Ref Range   Glucose-Capillary 138 (H) 70 - 99 mg/dL    Comment: Glucose reference range applies only to samples taken after fasting for at least 8 hours.  Glucose, capillary     Status: Abnormal   Collection Time: 09/18/21  4:37 PM  Result Value Ref Range   Glucose-Capillary 117 (H) 70 - 99 mg/dL    Comment: Glucose reference range applies only to samples taken after fasting for at least 8 hours.  Glucose, capillary     Status: Abnormal   Collection Time: 09/18/21  9:19 PM  Result Value Ref Range   Glucose-Capillary 159 (H) 70 - 99 mg/dL    Comment: Glucose reference range applies only to samples taken after fasting for at least 8 hours.   Comment 1 Notify RN    Comment 2 Document in Chart   Glucose, capillary     Status: Abnormal   Collection Time: 09/19/21  6:13 AM  Result Value Ref Range   Glucose-Capillary 121 (H) 70 - 99 mg/dL    Comment: Glucose reference range applies only to samples taken after fasting for at least 8 hours.   Comment 1 Notify RN    Comment 2 Document  in Chart   Glucose, capillary     Status: Abnormal   Collection Time: 09/19/21 12:07 PM  Result Value Ref Range   Glucose-Capillary 119 (H) 70 - 99 mg/dL    Comment: Glucose reference range applies only to samples taken after fasting for at least 8 hours.   Comment 1 Notify RN    Comment 2 Document in Chart    No results found.    Blood pressure 103/61, pulse 75, temperature 98.2  F (36.8 C), resp. rate 18, SpO2 100 %.  Medical Problem List and Plan: 1. Functional deficits secondary to debility from septic shock.  -patient may shower  -ELOS/Goals: S 10-14 days  Admit to CIR 2.  Antithrombotics: -DVT/anticoagulation:  Pharmaceutical: Other (comment) Eliquis  -antiplatelet therapy: N/A 3. Pain Management: Ultram or tylenol prn.  4. Mood: LCSW to follow for evaluation and support.   -antipsychotic agents: N/A 5. Neuropsych: This patient is not fully capable of making decisions on his own behalf. 6. Skin/Wound Care:  Air mattress overlay for DTI  --will add juven/vitamins to promote wound healing.  7. Fluids/Electrolytes/Nutrition: Strict I/O. Juven and Ensure max  --Recheck CMET in am 8. Chronic systolic/diastolic CHF: Monitor for signs of overload. Check daily weights.   --Back on Demadex, Entresto, Coreg and Pravacol. Resume Jardiance.   --add Low salt/HH restrictions.  9. SSS/A fib s/p PPM/ICD: Monitor HR TID. Monitor for symptoms with increase in activity.  --HR controlled on Eliquis, coreg and amiodarone.   10. CKD: Baseline SCr-1.5 and renal status trending back to baseline with resumption of diuretics.   --continue to monitor with serial checks.  11. Pre-diabetes/Hyperglycemia: Hgb A1C- 5.7. Jardiance on hold due to infection.  --Elevated BS likely due to steroids/illness.  --Will change Ensure to Ensure Max/Add CM restrictions  --Resume Jardiance in am and wean off insulin glargine.  12. COPD: Respiratory status stable--to resume Dulera.  --Wean oxgyen as tolerated. Has been oxygen dependent at nights.   --encourage pulmonary hygiene.  13. BPH/Nephrolithiasis: Urinary retention at admission. Continue Flomax and Proscar.   --follow up with Dr. Thalia Bloodgood after discharge.  14.H/o crani w/ Seizure prevention: On Phenobarbital 64.8 mg bid since 1995 and seizure free  --tolerating Eliquis/phenobarb without SE 15.  Morbid Obesity: Educate on wt  loss/appropriate diet to promote overall health and mobility.  16. Screening for vitamin D deficiency: check vitamin D level tomorrow.  17. History of hypermagnesemia: check magnesium level tomorrow   I have personally performed a face to face diagnostic evaluation, including, but not limited to relevant history and physical exam findings, of this patient and developed relevant assessment and plan.  Additionally, I have reviewed and concur with the physician assistant's documentation above.  Bary Leriche, PA-C   Izora Ribas, MD 09/19/2021

## 2021-09-20 DIAGNOSIS — Z1321 Encounter for screening for nutritional disorder: Secondary | ICD-10-CM

## 2021-09-20 DIAGNOSIS — I10 Essential (primary) hypertension: Secondary | ICD-10-CM

## 2021-09-20 LAB — COMPREHENSIVE METABOLIC PANEL
ALT: 51 U/L — ABNORMAL HIGH (ref 0–44)
AST: 24 U/L (ref 15–41)
Albumin: 2.6 g/dL — ABNORMAL LOW (ref 3.5–5.0)
Alkaline Phosphatase: 98 U/L (ref 38–126)
Anion gap: 5 (ref 5–15)
BUN: 49 mg/dL — ABNORMAL HIGH (ref 8–23)
CO2: 33 mmol/L — ABNORMAL HIGH (ref 22–32)
Calcium: 9.5 mg/dL (ref 8.9–10.3)
Chloride: 108 mmol/L (ref 98–111)
Creatinine, Ser: 1.47 mg/dL — ABNORMAL HIGH (ref 0.61–1.24)
GFR, Estimated: 49 mL/min — ABNORMAL LOW (ref >60)
Glucose, Bld: 109 mg/dL — ABNORMAL HIGH (ref 70–99)
Potassium: 4.2 mmol/L (ref 3.5–5.1)
Sodium: 146 mmol/L — ABNORMAL HIGH (ref 135–145)
Total Bilirubin: 0.5 mg/dL (ref 0.3–1.2)
Total Protein: 7.7 g/dL (ref 6.5–8.1)

## 2021-09-20 LAB — CBC WITH DIFFERENTIAL/PLATELET
Abs Immature Granulocytes: 0.1 10*3/uL — ABNORMAL HIGH (ref 0.00–0.07)
Basophils Absolute: 0.1 10*3/uL (ref 0.0–0.1)
Basophils Relative: 2 %
Eosinophils Absolute: 0.3 10*3/uL (ref 0.0–0.5)
Eosinophils Relative: 4 %
HCT: 32.9 % — ABNORMAL LOW (ref 39.0–52.0)
Hemoglobin: 10.3 g/dL — ABNORMAL LOW (ref 13.0–17.0)
Lymphocytes Relative: 10 %
Lymphs Abs: 0.7 10*3/uL (ref 0.7–4.0)
MCH: 30.5 pg (ref 26.0–34.0)
MCHC: 31.3 g/dL (ref 30.0–36.0)
MCV: 97.3 fL (ref 80.0–100.0)
Monocytes Absolute: 0.2 10*3/uL (ref 0.1–1.0)
Monocytes Relative: 3 %
Myelocytes: 1 %
Neutro Abs: 5.4 10*3/uL (ref 1.7–7.7)
Neutrophils Relative %: 80 %
Platelets: 211 10*3/uL (ref 150–400)
RBC: 3.38 MIL/uL — ABNORMAL LOW (ref 4.22–5.81)
RDW: 13.5 % (ref 11.5–15.5)
WBC: 6.8 10*3/uL (ref 4.0–10.5)
nRBC: 0 % (ref 0.0–0.2)
nRBC: 0 /100 WBC

## 2021-09-20 LAB — GLUCOSE, CAPILLARY
Glucose-Capillary: 99 mg/dL (ref 70–99)
Glucose-Capillary: 98 mg/dL (ref 70–99)
Glucose-Capillary: 131 mg/dL — ABNORMAL HIGH (ref 70–99)
Glucose-Capillary: 139 mg/dL — ABNORMAL HIGH (ref 70–99)

## 2021-09-20 LAB — VITAMIN D 25 HYDROXY (VIT D DEFICIENCY, FRACTURES): Vit D, 25-Hydroxy: 54.55 ng/mL (ref 30–100)

## 2021-09-20 LAB — MAGNESIUM: Magnesium: 2.1 mg/dL (ref 1.7–2.4)

## 2021-09-20 MED ORDER — ORAL CARE MOUTH RINSE
15.0000 mL | Freq: Two times a day (BID) | OROMUCOSAL | Status: DC
  Administered 2021-09-20 – 2021-09-25 (×7): 15 mL via OROMUCOSAL

## 2021-09-20 MED ORDER — VITAMIN D 25 MCG (1000 UNIT) PO TABS
1000.0000 [IU] | ORAL_TABLET | Freq: Every day | ORAL | Status: DC
  Administered 2021-09-20 – 2021-09-25 (×6): 1000 [IU] via ORAL
  Filled 2021-09-20 (×6): qty 1

## 2021-09-20 MED ORDER — TAMSULOSIN HCL 0.4 MG PO CAPS
0.8000 mg | ORAL_CAPSULE | Freq: Every day | ORAL | Status: DC
Start: 2021-09-20 — End: 2021-09-25
  Administered 2021-09-20 – 2021-09-24 (×5): 0.8 mg via ORAL
  Filled 2021-09-20 (×5): qty 2

## 2021-09-20 NOTE — Evaluation (Signed)
Speech Language Pathology Assessment and Plan  Patient Details  Name: Ricky Hayden MRN: [id number] Date of Birth: 06-Apr-1946  SLP Diagnosis: Cognitive Impairments;Dysphagia  Rehab Potential: Good ELOS: 3.5-4 weeks   Today's Date: 09/20/2021 SLP Individual Time: 1300-1400 SLP Individual Time Calculation (min): 67 min  Hospital Problem: Principal Problem:   Debility  Past Medical History:  Past Medical History:  Diagnosis Date   Arthritis    osteoarthritis of left knee   Ascending aorta dilatation (HCC)    Balanitis    recurrent   Cardiac arrhythmia    life threatening, secondary to CCB vs b- blockers   Cardiomyopathy ([place])    Chronic joint pain    Chronic systolic CHF (congestive heart failure) (HCC)    CKD (chronic kidney disease), stage III (HCC)    COPD (chronic obstructive pulmonary disease) ([place])    Coronary artery disease    Dilated aortic root (HCC)    Enlarged prostate    Erectile dysfunction    secondary to Peyronie's disease   GERD (gastroesophageal reflux disease)    Gout    Hiatal hernia    Hyperlipidemia    Hypertension    Intracranial hematoma ([place]) 1995   history of, s/p evacuation by Dr. Sherwood Gambler   Nocturia    Obesity    Pansinusitis    a.  complicated by brain abscess and bleeding requiring craniotomy in 1995.   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/2020   Procedure: PACEMAKER IMPLANT;  Surgeon:  Deboraha Sprang, MD;  Location: [facility];  Service: Cardiovascular;  Laterality: N/A;   RIGHT/LEFT HEART CATH AND CORONARY ANGIOGRAPHY N/A 09/20/2019   Procedure: RIGHT/LEFT HEART CATH AND CORONARY ANGIOGRAPHY;  Surgeon: Jolaine Artist, MD;  Location: [facility];  Service: Cardiovascular;  Laterality: N/A;   shoulder surg rt   1995   SINUS SURGERY WITH INSTATRAK     ethmoidectomy and nasal septum repair    Assessment / Plan / Recommendation Clinical Impression  Patient is a 76 year old male with history of CKD- baseline SCr- 1.5, COPD, CAD/cardiac arrhthymias s/p ICD/PPM, A fib- on eliquis, seizure disorder (on phenobarbital), recent UTI  who was admitted on 08/29/21 with fever T-103, hematuria and chills due urospesis. CT abdomen showed distended bladder with non-obstructing renal stones and subcentimeter exophytic lesion on left kidney.On 05/02, he had decrease in LOC with increase in oxygen requirement and required intubation as well as brief PEA arrest after intubation. CXR showed right infiltrate concerning for aspiration event with HCAP. Acute on chronic renal failure felt to be due to ATN secondary to  severe UTI/sepsis/shock per Dr. Posey Pronto.  EEG was negative for seizure. CT head negative for acute changes. He tolerated extubation by 05/12 and placed on D3, thins due to prolonged oral phase/mastication. Delirium is resolving but he continues to have delay in processing, decreased awareness of deficits, fatigue,  generalized weakness and poor balance with decrease in postural reaction. PT/OT working with patient and CIR recommended due to functional decline. Currently complains of shortness of breath. Patient transferred to CIR on 09/19/2021  Pt participation in [place] evaluation was limited due to severe lethargy following PT then OT assessments, son present and providing information on baseline vs current function. Previously, son states pt had impaired cognition and family was  staying with patient at night d/t falls. Family was responsible for medication and financial management as well as meal service. Pt was not driving and was utilizing rollator in the community. Pt was tolerating a regular/thin diet with no difficulties. Since hospitalization, pt has been disoriented with some confusion and varying levels of alertness. With max cues pt rouses for <30 seconds to answer questions this date. States he believes the MOY is "May", denies pain and nods head to yes/no questions.   Pt has been tolerating a Dys 3/thin liquid diet since 5/13 following extubation. MBS 5/16 reveals mild pharyngeal dysphagia with recommendations to continue Dys 3/thin diet with aspiration precautions. Pt tolerating thin liquids via cup during assessment with no overt s/s aspiration or change in vocal quality. Pt was not agreeable/appropriate for solid intake at this time d/t decreased arousal level. Recommend continuing currently prescribed diet of Dys 3/thin with full supervision for use of compensatory strategies. Pt will benefit from skilled ST to target cognitive communication and swallow function to increase safety and independence with daily living tasks while tolerating safest and least restrictive diet.    Skilled Therapeutic Interventions          SLP administered Bedside swallow evaluation as well as speech, language and cognitive assessment. Please see above.   SLP Assessment  Patient will need skilled Speech Lanaguage Pathology Services during CIR admission    Recommendations  SLP Diet Recommendations: Dysphagia 3 (Mech soft);Thin Liquid Administration via: Cup;Straw Medication Administration: Whole meds with puree Supervision: Patient able to self feed;Full supervision/cueing for compensatory strategies Compensations: Slow rate;Small sips/bites;Clear throat intermittently Postural Changes and/or Swallow Maneuvers: Seated upright 90 degrees Oral Care Recommendations: Oral care  BID Patient destination: Home Follow up Recommendations: Home Health SLP;Outpatient SLP Equipment Recommended: To be determined    SLP Frequency 3 to 5 out of 7 days   SLP Duration  SLP Intensity  SLP Treatment/Interventions 3.5-4 weeks  Minumum of 1-2 x/day, 30 to 90 minutes  Cognitive remediation/compensation;Environmental controls;Internal/external aids;Therapeutic Exercise;Therapeutic Activities;Patient/family education;Functional tasks;Dysphagia/aspiration precaution training;Cueing hierarchy    Pain Pain Assessment Pain Scale: 0-10 Pain Score: 0-No pain Pain Type: Acute pain Pain Location: Back Pain Orientation: Lower Pain Descriptors / Indicators: Discomfort Pain Frequency: Intermittent Pain Onset: Gradual Pain Intervention(s): Medication (See eMAR)  Prior Functioning Cognitive/Linguistic Baseline: Baseline deficits Baseline deficit details: son reports cognitive impairments, had brain surgery in 1995 and function has declined since wife passed 2 years ago Type of Home: House  Lives With: Alone;Family (children sleep with him at night) Available Help at Discharge: Family;Available 24 hours/day Vocation: On disability  SLP Evaluation Cognition Overall Cognitive Status: Impaired/Different from baseline Arousal/Alertness: Lethargic Orientation Level: Oriented to person;Oriented to situation Year:  (did not state) Month: March Day of Week:  (did not state) Attention: Focused;Sustained Focused Attention: Impaired Focused Attention Impairment: Verbal basic;Functional basic Sustained Attention: Impaired Sustained Attention Impairment: Verbal basic;Functional basic Memory: Impaired Memory Impairment: Decreased short term memory;Decreased recall of new information Decreased Short Term Memory: Verbal basic;Functional basic Awareness: Impaired Awareness Impairment: Intellectual impairment Problem Solving: Impaired Problem Solving Impairment: Verbal basic;Functional  basic Safety/Judgment: Impaired (son reports pt trying to stand from chair)  Comprehension Auditory Comprehension Overall Auditory Comprehension: Impaired Yes/No Questions: Impaired Basic Biographical Questions: Other (comment) (poor performance 2' extreme lethargy) Basic Immediate Environment Questions: Other (comment) (poor performance 2' extreme lethargy) Commands: Impaired One Step Basic Commands: 50-74% accurate Two Step Basic Commands: 0-24% accurate (likely d/t lethargy) Conversation: Simple Interfering Components: Attention;Processing speed EffectiveTechniques: Extra processing time;Increased volume;Repetition;Slowed speech Visual Recognition/Discrimination Discrimination: Not tested Reading Comprehension Reading Status: Not tested Expression Expression Primary Mode of Expression: Verbal Verbal Expression Overall Verbal Expression: Other (comment) (difficult to assess due to decreased arousal) Interfering Components: Attention Written Expression Dominant Hand: Right Written Expression: Not tested Oral Motor Oral Motor/Sensory Function Overall Oral Motor/Sensory Function: Within functional limits  Care Tool Care Tool Cognition Ability to hear (with hearing aid or hearing appliances if normally used Ability to hear (with hearing aid or hearing appliances if normally used): 1. Minimal difficulty - difficulty in some environments (e.g. when person speaks softly or setting is noisy)   Expression of Ideas and Wants Expression of Ideas and Wants: 3. Some difficulty - exhibits some difficulty with expressing needs and ideas (e.g, some words or finishing thoughts) or speech is not clear   Understanding Verbal and Non-Verbal Content Understanding Verbal and Non-Verbal Content: 2. Sometimes understands - understands only basic conversations or simple, direct phrases. Frequently requires cues to understand  Memory/Recall Ability Memory/Recall Ability : Current season   Bedside  Swallowing Assessment General Date of Onset: 09/13/21 Previous Swallow Assessment: BSE in acute care Diet Prior to this Study: Dysphagia 3 (soft);Thin liquids Temperature Spikes Noted: No Respiratory Status: Supplemental O2 delivered via (comment) (2L via [place]) History of Recent Intubation: Yes Length of Intubations (days): 10 days Date extubated: 09/12/21 Behavior/Cognition: Lethargic/Drowsy;Requires cueing Oral Cavity - Dentition: Adequate natural dentition;Missing dentition Self-Feeding Abilities: Able to feed self (when alert) Vision: Functional for self-feeding Patient Positioning: Upright in bed Baseline Vocal Quality: Normal Volitional Cough: Strong Volitional Swallow: Able to elicit  Oral Care Assessment Does patient have any of the following "high(er) risk" factors?: None of the above Does patient have any of the following "at risk" factors?: Oxygen therapy - cannula, mask, simple oxygen devices Ice Chips Ice chips: Not tested Thin Liquid Thin Liquid: Within functional limits Presentation: Straw Nectar Thick Nectar Thick Liquid: Not tested Honey Thick Honey Thick Liquid: Not tested Puree Puree: Not tested Solid Solid: Not tested BSE Assessment Risk for Aspiration Impact on safety and function: Mild aspiration risk Other Related Risk Factors: Lethargy  Short Term Goals: Week 1: SLP Short Term Goal 1 (Week 1): Pt will attend to functional tasks for 8-10 minutes with mod A cues for support SLP Short Term Goal 2 (Week 1): Pt will recall functional information with mod A cues for compensatory strategies SLP Short Term Goal 3 (Week 1): Pt will increase orientation x4 with mod A cues for use of external aids SLP Short Term Goal 4 (Week 1): Pt will solve basic problems during functional tasks with mod A cues SLP Short Term Goal 5 (Week 1): Pt will tolerate Dys 3/thin diet with min A cues for use of compensatory swallow strategies  Refer to Care Plan for Long Term  Goals  Recommendations for other services: None   Discharge Criteria: Patient will be discharged from SLP if patient refuses treatment 3 consecutive times without medical reason, if treatment goals not met, if there is a change in medical status, if patient makes no progress towards goals or  if patient is discharged from hospital.  The above assessment, treatment plan, treatment alternatives and goals were discussed and mutually agreed upon: by patient and by family  [name] 09/20/2021, 2:06 PM

## 2021-09-20 NOTE — Plan of Care (Signed)
Problem: RH Balance Goal: LTG: Patient will maintain dynamic sitting balance (OT) Description: LTG:  Patient will maintain dynamic sitting balance with assistance during activities of daily living (OT) Flowsheets (Taken 09/20/2021 1253) LTG: Pt will maintain dynamic sitting balance during ADLs with: Supervision/Verbal cueing Goal: LTG Patient will maintain dynamic standing with ADLs (OT) Description: LTG:  Patient will maintain dynamic standing balance with assist during activities of daily living (OT)  Flowsheets (Taken 09/20/2021 1253) LTG: Pt will maintain dynamic standing balance during ADLs with: Minimal Assistance - Patient > 75%   Problem: Sit to Stand Goal: LTG:  Patient will perform sit to stand in prep for activites of daily living with assistance level (OT) Description: LTG:  Patient will perform sit to stand in prep for activites of daily living with assistance level (OT) Flowsheets (Taken 09/20/2021 1253) LTG: PT will perform sit to stand in prep for activites of daily living with assistance level: Minimal Assistance - Patient > 75%   Problem: RH Eating Goal: LTG Patient will perform eating w/assist, cues/equip (OT) Description: LTG: Patient will perform eating with assist, with/without cues using equipment (OT) Flowsheets (Taken 09/20/2021 1253) LTG: Pt will perform eating with assistance level of: Set up assist    Problem: RH Grooming Goal: LTG Patient will perform grooming w/assist,cues/equip (OT) Description: LTG: Patient will perform grooming with assist, with/without cues using equipment (OT) Flowsheets (Taken 09/20/2021 1253) LTG: Pt will perform grooming with assistance level of: Supervision/Verbal cueing   Problem: RH Bathing Goal: LTG Patient will bathe all body parts with assist levels (OT) Description: LTG: Patient will bathe all body parts with assist levels (OT) Flowsheets (Taken 09/20/2021 1253) LTG: Pt will perform bathing with assistance level/cueing: Minimal  Assistance - Patient > 75%   Problem: RH Dressing Goal: LTG Patient will perform upper body dressing (OT) Description: LTG Patient will perform upper body dressing with assist, with/without cues (OT). Flowsheets (Taken 09/20/2021 1253) LTG: Pt will perform upper body dressing with assistance level of: Supervision/Verbal cueing Goal: LTG Patient will perform lower body dressing w/assist (OT) Description: LTG: Patient will perform lower body dressing with assist, with/without cues in positioning using equipment (OT) Flowsheets (Taken 09/20/2021 1253) LTG: Pt will perform lower body dressing with assistance level of: Moderate Assistance - Patient 50 - 74%   Problem: RH Toileting Goal: LTG Patient will perform toileting task (3/3 steps) with assistance level (OT) Description: LTG: Patient will perform toileting task (3/3 steps) with assistance level (OT)  Flowsheets (Taken 09/20/2021 1253) LTG: Pt will perform toileting task (3/3 steps) with assistance level: Moderate Assistance - Patient 50 - 74%   Problem: RH Functional Use of Upper Extremity Goal: LTG Patient will use RT/LT upper extremity as a (OT) Description: LTG: Patient will use right/left upper extremity as a stabilizer/gross assist/diminished/nondominant/dominant level with assist, with/without cues during functional activity (OT) Flowsheets (Taken 09/20/2021 1253) LTG: Use of upper extremity in functional activities: RUE as dominant level LTG: Pt will use upper extremity in functional activity with assistance level of: Independent   Problem: RH Toilet Transfers Goal: LTG Patient will perform toilet transfers w/assist (OT) Description: LTG: Patient will perform toilet transfers with assist, with/without cues using equipment (OT) Flowsheets (Taken 09/20/2021 1253) LTG: Pt will perform toilet transfers with assistance level of: Minimal Assistance - Patient > 75%   Problem: RH Tub/Shower Transfers Goal: LTG Patient will perform  tub/shower transfers w/assist (OT) Description: LTG: Patient will perform tub/shower transfers with assist, with/without cues using equipment (OT) Flowsheets (Taken 09/20/2021 1253)  LTG: Pt will perform tub/shower stall transfers with assistance level of: Minimal Assistance - Patient > 75%   Problem: RH Attention Goal: LTG Patient will demonstrate this level of attention during functional activites (OT) Description: LTG:  Patient will demonstrate this level of attention during functional activites  (OT) Flowsheets (Taken 09/20/2021 1253) Patient will demonstrate this level of attention during functional activites: Sustained Patient will demonstrate above attention level in the following environment: Controlled LTG: Patient will demonstrate this level of attention during functional activites (OT): Supervision

## 2021-09-20 NOTE — Evaluation (Addendum)
Physical Therapy Assessment and Plan  Patient Details  Name: Ricky Hayden MRN: [id number] Date of Birth: March 20, 1946  PT Diagnosis: Difficulty walking, Impaired cognition, Impaired sensation, and Muscle weakness Rehab Potential: Fair ELOS: 25 to 28 days   Today's Date: 09/20/2021 PT Individual Time: 0800-0917 PT Individual Time Calculation (min): 45 min    Hospital Problem: Principal Problem:   Debility   Past Medical History:  Past Medical History:  Diagnosis Date   Arthritis    osteoarthritis of left knee   Ascending aorta dilatation ([place])    Balanitis    recurrent   Cardiac arrhythmia    life threatening, secondary to CCB vs b- blockers   Cardiomyopathy ([place])    Chronic joint pain    Chronic systolic CHF (congestive heart failure) ([place])    CKD (chronic kidney disease), stage III ([place])    COPD (chronic obstructive pulmonary disease) ([place])    Coronary artery disease    Dilated aortic root ([place])    Enlarged prostate    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 Sherwood Gambler   Nocturia    Obesity    Pansinusitis    a.  complicated by brain abscess and bleeding requiring craniotomy in 1995.   PONV (postoperative nausea and vomiting)    Sinusitis    s/p ethmoidectomy and nasal septoplasty   Vertigo    intermitantly   Past Surgical History:  Past Surgical History:  Procedure Laterality Date   BIV UPGRADE N/A 01/29/2020   Procedure: BIV UPGRADE;  Surgeon: Evans Lance, MD;  Location: [facility];  Service: Cardiovascular;  Laterality: N/A;   CIRCUMCISION N/A 11/20/2013   Procedure: CIRCUMCISION ADULT;  Surgeon: Claybon Jabs, MD;  Location: [facility];  Service: Urology;  Laterality: N/A;   [place]   hematomy due to sinus infection    PACEMAKER IMPLANT N/A 01/17/2020    Procedure: PACEMAKER IMPLANT;  Surgeon: Deboraha Sprang, MD;  Location: [facility];  Service: Cardiovascular;  Laterality: N/A;   RIGHT/LEFT HEART CATH AND CORONARY ANGIOGRAPHY N/A 09/20/2019   Procedure: RIGHT/LEFT HEART CATH AND CORONARY ANGIOGRAPHY;  Surgeon: Jolaine Artist, MD;  Location: [facility];  Service: Cardiovascular;  Laterality: N/A;   shoulder surg rt   1995   SINUS SURGERY WITH INSTATRAK     ethmoidectomy and nasal septum repair    Assessment & Plan Clinical Impression: Patient is a 76 year old male with history of CKD- baseline SCr- 1.5, COPD, CAD/cardiac arrhthymias s/p ICD/PPM, A fib- on eliquis, seizure disorder (on phenobarbital), recent UTI  who was admitted on 08/29/21 with fever T-103, hematuria and chills due urospesis. He was started on IV antibiotics. CT abdomen showed distended bladder with non-obstructing renal stones and subcentimeter exophytic lesion on left kidney. Dr. Cain Sieve recommended bladder scan to monitor voiding, foley for retention and cysto on outpatient basis. BC positive for Proteus with ongoing fevers and ceftriaxone broadened to Cefazolin X 14 days per Dr. Linus Salmons.   On 05/02, he had decrease in LOC with increase in oxygen requirement and required intubation as well as brief PEA arrest after intubation. CXR showed right infiltrate concerning for aspiration event with HCAP.    Hypotension due to septic shock treated with fluid bolus  and pressors. He had decrease in UOP as well as concerns of overload treated with and had worsening of renal status. Acute on chronic renal failure felt to be due to ATN secondary to  severe UTI/sepsis/shock per Dr. Posey Pronto.  EEG was negative for seizure. CT head negative for acute changes. Antibiotics transitioned to Ampicillin sulbactam with end date 5/10. He tolerated extubation by 05/12 and placed on D3, thins due to prolonged oral phase/mastication.  AKI resolved with renal function at baseline and nephrology  signed off. Stress dose steriods weaned off with improvement in BS. Delirium is resolving but he continues to have delay in processing, decreased awareness of deficits, fatigue, generalized weakness and poor balance with decrease in postural reaction. PT/OT working with patient and CIR recommended due to functional decline. Currently complains of shortness of breath.   Patient transferred to CIR on 09/19/2021 .   Patient currently requires max with mobility secondary to muscle weakness, decreased cardiorespiratoy endurance, abnormal tone, and decreased awareness, decreased problem solving, decreased safety awareness, and decreased memory.  Prior to hospitalization, patient was modified independent  with mobility and lived with Alone, Family, Other (Comment) (children take turns sleeping at home at night) in a House home.  Home access is 1 step onto porch and then step into houseStairs to enter.  Patient will benefit from skilled PT intervention to maximize safe functional mobility, minimize fall risk, and decrease caregiver burden for planned discharge home with 24 hour assist.  Anticipate patient will benefit from follow up [facility] at discharge.  PT - End of Session Activity Tolerance: Tolerates 30+ min activity with multiple rests Endurance Deficit: Yes Endurance Deficit Description: Very fatigued/lethargic post PT eval. PT Assessment Rehab Potential (ACUTE/IP ONLY): Fair PT Barriers to Discharge: Inaccessible home environment;Decreased caregiver support PT Patient demonstrates impairments in the following area(s): Balance;Safety;Perception;Endurance;Motor PT Transfers Functional Problem(s): Bed Mobility;Bed to Chair;Car PT Locomotion Functional Problem(s): Ambulation;Wheelchair Mobility;Stairs PT Plan PT Intensity: Minimum of 1-2 x/day ,45 to 90 minutes PT Frequency: 5 out of 7 days PT Duration Estimated Length of Stay: 25 to 28 days PT Treatment/Interventions: Ambulation/gait  training;Balance/vestibular training;Discharge planning;Community reintegration;DME/adaptive equipment instruction;Neuromuscular re-education;Functional mobility training;Patient/family education;Stair training;Splinting/orthotics;Therapeutic Activities;Therapeutic Exercise;UE/LE Strength taining/ROM;UE/LE Coordination activities;Visual/perceptual remediation/compensation;Wheelchair propulsion/positioning PT Transfers Anticipated Outcome(s): min A transfers PT Locomotion Anticipated Outcome(s): min A gait, min A stairs, S w/c mobility PT Recommendation Equipment at Discharge: TBD Follow Up Recommendations: Skilled nursing facility;Home health PT Patient destination: [facility] (SNF)   PT Evaluation Precautions/Restrictions Precautions Precautions: Fall Precaution Comments: 2L O2 Restrictions Weight Bearing Restrictions: No General Chart Reviewed: Yes Family/Caregiver Present: Yes (at end of evaluation)  Oxygen Therapy SpO2: 98 % O2 Device: Nasal Cannula Pain Pain Assessment Pain Scale: 0-10 Pain Score: 0-No pain Pain Interference Pain Interference Pain Effect on Sleep: 1. Rarely or not at all Pain Interference with Therapy Activities: 1. Rarely or not at all Pain Interference with Day-to-Day Activities: 1. Rarely or not at all Home Living/Prior [place] Available Help at Discharge: Family;Available PRN/intermittently (family available at nights only, except maybe one day a week during the day) Type of Home: House Home Access: Stairs to enter [employer] of Steps: 1 step onto porch and then step into house Entrance Stairs-Rails: None Home Layout: One level Bathroom Shower/Tub: Walk-in shower;Tub/shower unit Bathroom Toilet: Handicapped height Bathroom Accessibility: Yes  Lives With: Alone;Family;Other (Comment) (children take turns sleeping at home at night) Prior Function Level of Independence: Independent with gait;Independent with  transfers;Requires assistive device for independence Meal Prep:  Maximal Laundry: Total Vacuuming: Total Light Housekeeping: Maximal Shopping: Total  Able to Take Stairs?: Yes Driving: No Vocation: On disability Vision/Perception  Vision - History Ability to See in Adequate Light: 0 Adequate Perception Perception: Within Functional Limits Praxis Praxis: Impaired Praxis Impairment Details: Initiation;Motor planning  Cognition Overall Cognitive Status: Impaired/Different from baseline Arousal/Alertness: Awake/alert Orientation Level: Oriented to person;Oriented to situation;Oriented to place Year: Other (Comment) (2002) Month: December Day of Week: Incorrect Memory: Impaired Memory Impairment: Decreased recall of new information Decreased Short Term Memory: Verbal basic;Functional basic Awareness: Impaired Problem Solving: Impaired Safety/Judgment: Impaired Sensation Sensation Light Touch: Impaired by gross assessment Proprioception: Impaired by gross assessment Additional Comments: decreased sensation B feet Coordination Gross Motor Movements are Fluid and Coordinated: No Motor  Motor Motor: Abnormal postural alignment and control Trunk/Postural Assessment  Cervical Assessment Cervical Assessment: Exceptions to [facility] Thoracic Assessment Thoracic Assessment: Exceptions to [facility] Lumbar Assessment Lumbar Assessment: Exceptions to [facility] Postural Control Postural Control: Deficits on evaluation  Balance Balance Balance Assessed: Yes Static Sitting Balance Static Sitting - Balance Support: Feet supported Static Sitting - Level of Assistance: 5: Stand by assistance;4: Min assist Static Standing Balance Static Standing - Balance Support: During functional activity Static Standing - Level of Assistance: 3: Mod assist;2: Max assist Dynamic Standing Balance Dynamic Standing - Level of Assistance: 2: Max assist;1: +1 Total assist Extremity Assessment  B UEs as per OT  evaluation RLE Assessment RLE Assessment: Exceptions to [facility] Passive Range of Motion (PROM) Comments: wfls General Strength Comments: grossly 2+/5-3-/5 LLE Assessment LLE Assessment: Exceptions to [facility] Passive Range of Motion (PROM) Comments: wfls General Strength Comments: grossly 2+/5-3-/5  Care Tool Care Tool Bed Mobility Roll left and right activity        Sit to lying activity   Sit to lying assist level: 2 Helpers    Lying to sitting on side of bed activity   Lying to sitting on side of bed assist level: the ability to move from lying on the back to sitting on the side of the bed with no back support.: Moderate Assistance - Patient 50 - 74%     Care Tool Transfers Sit to stand transfer   Sit to stand assist level: Total Assistance - Patient < 25%    Chair/bed transfer   Chair/bed transfer assist level: Total Assistance - Patient < 25%     Psychologist, educational transfer          Care Tool Locomotion Ambulation   Assist level: Maximal Assistance - Patient 25 - 49% Assistive device: No Device Max distance: 2  Walk 10 feet activity Walk 10 feet activity did not occur: Safety/medical concerns       Walk 50 feet with 2 turns activity Walk 50 feet with 2 turns activity did not occur: Safety/medical concerns      Walk 150 feet activity Walk 150 feet activity did not occur: Safety/medical concerns      Walk 10 feet on uneven surfaces activity Walk 10 feet on uneven surfaces activity did not occur: Safety/medical concerns      Stairs Stair activity did not occur: Safety/medical concerns        Walk up/down 1 step activity Walk up/down 1 step or curb (drop down) activity did not occur: Safety/medical concerns      Walk up/down 4 steps activity Walk up/down 4 steps activity did not occur: Safety/medical concerns      Walk up/down 12 steps activity Walk  up/down 12 steps activity did not occur: Safety/medical concerns      Pick up small objects from floor  Pick up small object from the floor (from standing position) activity did not occur: Safety/medical concerns      Wheelchair       Wheelchair assist level: Dependent - Patient 0%    Wheel 50 feet with 2 turns activity   Assist Level: Dependent - Patient 0%  Wheel 150 feet activity        Refer to Care Plan for Long Term Goals  SHORT TERM GOAL WEEK 1 PT Short Term Goal 1 (Week 1): Pt will increase rolling to mod A. PT Short Term Goal 2 (Week 1): Pt will increase supine to edge of bed to mod A. PT Short Term Goal 3 (Week 1): Pt will increase transfers bed to chair to mod A. PT Short Term Goal 4 (Week 1): Pt will increase ambulation with LRAD about 10 feet with mod A. PT Short Term Goal 5 (Week 1): Pt will ascend/descend 4 stairs with B rails and mod A.  Recommendations for other services: None   Skilled Therapeutic Intervention PT evaluation completed and treatment plan initiated. Pt's level of assistance increased throughout treatment as level fo fatigue increased. Pt was able to initially complete sit to stand with max A progressing to total A as fatigue increased. Pt was able to transfer to edge of bed with mod A however required +2 assist by end of treatment. Pt left sitting up in bed with all needs within reach at end of treatment.    Discharge Criteria: Patient will be discharged from PT if patient refuses treatment 3 consecutive times without medical reason, if treatment goals not met, if there is a change in medical status, if patient makes no progress towards goals or if patient is discharged from hospital.  The above assessment, treatment plan, treatment alternatives and goals were discussed and mutually agreed upon: by patient and by family  Dub Amis 09/20/2021, 3:59 PM

## 2021-09-20 NOTE — Evaluation (Signed)
Occupational Therapy Assessment and Plan  Patient Details  Name: Ricky Hayden MRN: [id number] Date of Birth: 1945-11-07  OT Diagnosis: acute pain, cognitive deficits, muscle weakness (generalized), and decreased postural control and activity tolerance Rehab Potential: Rehab Potential (ACUTE ONLY): Fair ELOS: 3.5-4 weeks   Today's Date: 09/20/2021 OT Individual Time: 1000-1059 OT Individual Time Calculation (min): 59 min     Hospital Problem: Principal Problem:   Debility   Past Medical History:  Past Medical History:  Diagnosis Date   Arthritis    osteoarthritis of left knee   Ascending aorta dilatation (HCC)    Balanitis    recurrent   Cardiac arrhythmia    life threatening, secondary to CCB vs b- blockers   Cardiomyopathy (HCC)    Chronic joint pain    Chronic systolic CHF (congestive heart failure) (HCC)    CKD (chronic kidney disease), stage III (HCC)    COPD (chronic obstructive pulmonary disease) (HCC)    Coronary artery disease    Dilated aortic root (HCC)    Enlarged prostate    Erectile dysfunction    secondary to Peyronie's disease   GERD (gastroesophageal reflux disease)    Gout    Hiatal hernia    Hyperlipidemia    Hypertension    Intracranial hematoma (HCC) 1995   history of, s/p evacuation by Dr. Newell Coral   Nocturia    Obesity    Pansinusitis    a.  complicated by brain abscess and bleeding requiring craniotomy in 1995.   PONV (postoperative nausea and vomiting)    Sinusitis    s/p ethmoidectomy and nasal septoplasty   Vertigo    intermitantly   Past Surgical History:  Past Surgical History:  Procedure Laterality Date   BIV UPGRADE N/A 01/29/2020   Procedure: BIV UPGRADE;  Surgeon: Marinus Maw, MD;  Location: [facility] INVASIVE CV LAB;  Service: Cardiovascular;  Laterality: N/A;   CIRCUMCISION N/A 11/20/2013   Procedure: CIRCUMCISION ADULT;  Surgeon: Garnett Farm, MD;  Location: [facility] ORS;  Service: Urology;  Laterality: N/A;   CRANIOTOMY  1995    hematomy due to sinus infection    PACEMAKER IMPLANT N/A 01/17/2020   Procedure: PACEMAKER IMPLANT;  Surgeon: Duke Salvia, MD;  Location: [facility] INVASIVE CV LAB;  Service: Cardiovascular;  Laterality: N/A;   RIGHT/LEFT HEART CATH AND CORONARY ANGIOGRAPHY N/A 09/20/2019   Procedure: RIGHT/LEFT HEART CATH AND CORONARY ANGIOGRAPHY;  Surgeon: Dolores Patty, MD;  Location: [facility] INVASIVE CV LAB;  Service: Cardiovascular;  Laterality: N/A;   shoulder surg rt   1995   SINUS SURGERY WITH INSTATRAK     ethmoidectomy and nasal septum repair    Assessment & Plan Clinical Impression: Patient is a 76 y.o. year old male  with history of CKD- baseline SCr- 1.5, COPD, CAD/cardiac arrhthymias s/p ICD/PPM, A fib- on eliquis, seizure disorder (on phenobarbital), recent UTI  who was admitted on 08/29/21 with fever T-103, hematuria and chills due urospesis. He was started on IV antibiotics. CT abdomen showed distended bladder with non-obstructing renal stones and subcentimeter exophytic lesion on left kidney. Dr. Lafonda Mosses recommended bladder scan to monitor voiding, foley for retention and cysto on outpatient basis. BC positive for Proteus with ongoing fevers and ceftriaxone broadened to Cefazolin X 14 days per Dr. Luciana Axe.   On 05/02, he had decrease in LOC with increase in oxygen requirement and required intubation as well as brief PEA arrest after intubation. CXR showed right infiltrate concerning for aspiration event with HCAP.  Hypotension due to septic shock treated with fluid bolus and pressors. He had decrease in UOP as well as concerns of overload treated with and had worsening of renal status. Acute on chronic renal failure felt to be due to ATN secondary to  severe UTI/sepsis/shock per Dr. Posey Pronto.  EEG was negative for seizure. CT head negative for acute changes. Antibiotics transitioned to Ampicillin sulbactam with end date 5/10. He tolerated extubation by 05/12 and placed on D3, thins due to prolonged oral  phase/mastication.  AKI resolved with renal function at baseline and nephrology signed off. Stress dose steriods weaned off with improvement in BS. Delirium is resolving but he continues to have delay in processing, decreased awareness of deficits, fatigue, generalized weakness and poor balance with decrease in postural reaction. PT/OT working with patient and CIR recommended due to functional decline. Currently complains of shortness of breath.   .  Patient transferred to CIR on 09/19/2021 .    Patient currently requires  total A of 2  with basic self-care skills secondary to muscle weakness, decreased cardiorespiratoy endurance, impaired timing and sequencing, unbalanced muscle activation, decreased coordination, and decreased motor planning, decreased visual acuity, decreased motor planning, decreased initiation, decreased attention, decreased awareness, decreased problem solving, decreased safety awareness, decreased memory, and delayed processing, and decreased sitting balance, decreased postural control, and decreased balance strategies.  Prior to hospitalization, patient could complete BADL/mobility with  ind to mod I .  Patient will benefit from skilled intervention to decrease level of assist with basic self-care skills and increase independence with basic self-care skills prior to discharge home with care partner.  Anticipate patient will require 24 hour supervision and moderate physical assestance and follow up home health.  OT - End of Session Activity Tolerance: Tolerates < 10 min activity, no significant change in vital signs Endurance Deficit: Yes Endurance Deficit Description: Very fatigued/lethargic post PT eval. OT Assessment Rehab Potential (ACUTE ONLY): Fair OT Barriers to Discharge: Decreased caregiver support;Inaccessible home environment;Home environment access/layout OT Patient demonstrates impairments in the following area(s):  Balance;Behavior;Cognition;Endurance;Motor;Pain;Safety OT Basic ADL's Functional Problem(s): Eating;Grooming;Bathing;Dressing;Toileting OT Transfers Functional Problem(s): Toilet;Tub/Shower OT Additional Impairment(s): Fuctional Use of Upper Extremity OT Plan OT Intensity: Minimum of 1-2 x/day, 45 to 90 minutes OT Frequency: 5 out of 7 days OT Duration/Estimated Length of Stay: 3.5-4 weeks OT Treatment/Interventions: Balance/vestibular training;Disease mangement/prevention;Neuromuscular re-education;Self Care/advanced ADL retraining;Therapeutic Exercise;Wheelchair propulsion/positioning;UE/LE Strength taining/ROM;Pain management;DME/adaptive equipment instruction;Cognitive remediation/compensation;Community reintegration;Patient/family education;UE/LE Coordination activities;Therapeutic Activities;Psychosocial support;Functional mobility training;Discharge planning OT Self Feeding Anticipated Outcome(s): set-up A OT Basic Self-Care Anticipated Outcome(s): S to mod A OT Toileting Anticipated Outcome(s): min A OT Bathroom Transfers Anticipated Outcome(s): min A OT Recommendation Patient destination: Home Follow Up Recommendations: Home health OT;Skilled nursing facility;24 hour supervision/assistance Equipment Recommended: To be determined   OT Evaluation Precautions/Restrictions  Restrictions Weight Bearing Restrictions: No General Chart Reviewed: Yes Response to Previous Treatment: Patient reporting fatigue but able to participate Family/Caregiver Present: Yes (son)  Pain "pain all over" did not rate Home Living/Prior [place] expects to be discharged to:: Private residence Living Arrangements: Alone Available Help at Discharge: Family, Available PRN/intermittently (per son available nights only) Type of Home: House Home Access: Stairs to enter Technical brewer of Steps: 3 Entrance Stairs-Rails: None Home Layout: One level Bathroom  Shower/Tub: Gaffer, Chiropodist: Handicapped height Bathroom Accessibility: Yes  Lives With: Alone, Family (children sleep with him at night) IADL History Homemaking Responsibilities: No Prior Function Level of Independence: Independent with basic ADLs, Needs assistance with  homemaking Meal Prep: Maximal Laundry: Total Vacuuming: Total Light Housekeeping: Maximal Shopping: Total  Able to Take Stairs?: Yes Driving: No Vocation: On disability Vision Baseline Vision/History: 1 Wears glasses (readers) Ability to See in Adequate Light: 0 Adequate Patient Visual Report: Blurring of vision Vision Assessment?: Vision impaired- to be further tested in functional context Perception  Perception: Within Functional Limits Praxis Praxis: Impaired Praxis Impairment Details: Initiation;Motor planning Cognition Cognition Overall Cognitive Status: Impaired/Different from baseline Arousal/Alertness: Lethargic Orientation Level: Person Memory: Impaired Memory Impairment: Decreased short term memory;Decreased recall of new information Decreased Short Term Memory: Verbal basic;Functional basic Attention: Focused Focused Attention: Impaired Focused Attention Impairment: Verbal basic;Functional basic Sustained Attention: Impaired Sustained Attention Impairment: Verbal basic;Functional basic Awareness: Impaired Awareness Impairment: Intellectual impairment Problem Solving: Impaired Problem Solving Impairment: Verbal basic;Functional basic Safety/Judgment: Impaired (son reports pt trying to stand from chair) Brief Interview for Mental Status (BIMS) Repetition of Three Words (First Attempt): 3 Temporal Orientation: Year: No answer Temporal Orientation: Month: No answer Temporal Orientation: Day: Correct Recall: "Sock": No, could not recall Recall: "Blue": Yes, after cueing ("a color") Recall: "Bed": No, could not recall BIMS Summary Score:  5 Sensation Sensation Light Touch: Impaired Detail Hot/Cold: Not tested Proprioception: Impaired by gross assessment Stereognosis: Appears Intact Additional Comments: reports B feet paresthetias to MD, intact to touch with mobility Coordination Gross Motor Movements are Fluid and Coordinated: No Fine Motor Movements are Fluid and Coordinated: No Coordination and Movement Description: generalized deconditioning, significantly limited by lethargy/poor initiation/"all over" pain Motor  Motor Motor: Other (comment);Abnormal postural alignment and control Motor - Skilled Clinical Observations: generalized deconditioning, significantly limited by lethargy/poor initiation/"all over" pain  Trunk/Postural Assessment  Cervical Assessment Cervical Assessment: Exceptions to [facility] (head tilt R) Thoracic Assessment Thoracic Assessment: Exceptions to [facility] (rounded shoulders) Lumbar Assessment Lumbar Assessment: Exceptions to [facility] (posterior pelvic tilt sitting EOB) Postural Control Postural Control: Deficits on evaluation  Balance Balance Balance Assessed: Yes Static Sitting Balance Static Sitting - Balance Support: Feet supported;Right upper extremity supported Static Sitting - Level of Assistance: 5: Stand by assistance;4: Min assist Extremity/Trunk Assessment RUE Assessment RUE Assessment: Exceptions to [facility] General Strength Comments: generalized weakness, able to grasp bed rails LUE Assessment LUE Assessment: Exceptions to [facility] General Strength Comments: generalized weakness, able to grasp cup to drink/wash face but did not attempt to incorproate much functionally  Care Tool Care Tool Self Care Eating   Eating Assist Level: Maximal Assistance - Patient 25 - 49%    Oral Care    Oral Care Assist Level: Total assistance - Patient < 25%    Bathing     Body parts bathed by helper: Face   Assist Level: Minimal Assistance - Patient > 75%    Upper Body Dressing(including orthotics)    What is the patient wearing?: Hospital gown only   Assist Level: Total Assistance - Patient < 25%    Lower Body Dressing (excluding footwear)   What is the patient wearing?: Pants Assist for lower body dressing: 2 Helpers    Putting on/Taking off footwear   What is the patient wearing?: Non-skid slipper socks Assist for footwear: Total Assistance - Patient < 25%       Care Tool Toileting Toileting activity Toileting Activity did not occur (Clothing management and hygiene only): N/A (no void or bm)       Care Tool Bed Mobility Roll left and right activity   Roll left and right assist level: 2 Helpers    Sit to lying activity  Sit to lying assist level: 2 Helpers    Lying to sitting on side of bed activity   Lying to sitting on side of bed assist level: the ability to move from lying on the back to sitting on the side of the bed with no back support.: Maximal Assistance - Patient 25 - 49%     Care Tool Transfers Sit to stand transfer Sit to stand activity did not occur: Safety/medical concerns      Chair/bed transfer Chair/bed transfer activity did not occur: Safety/medical concerns       Toilet transfer Toilet transfer activity did not occur: Safety/medical concerns       Care Tool Cognition  Expression of Ideas and Wants Expression of Ideas and Wants: 3. Some difficulty - exhibits some difficulty with expressing needs and ideas (e.g, some words or finishing thoughts) or speech is not clear  Understanding Verbal and Non-Verbal Content Understanding Verbal and Non-Verbal Content: 2. Sometimes understands - understands only basic conversations or simple, direct phrases. Frequently requires cues to understand   Memory/Recall Ability Memory/Recall Ability : Current season   Refer to Care Plan for Long Term Goals  SHORT TERM GOAL WEEK 1 OT Short Term Goal 1 (Week 1): Pt will sit EOB for >5 min to complete ADL with min A for balance. OT Short Term Goal 2 (Week 1): Pt  will complete toilet transfer with max A and LRAD. OT Short Term Goal 3 (Week 1): Pt will complete grooming activity at sink with MIN a. OT Short Term Goal 4 (Week 1): Pt will don shirt with max A.  Recommendations for other services: Neuropsych   Skilled Therapeutic Intervention ADL ADL Eating: Moderate assistance Where Assessed-Eating: Bed level Grooming: Minimal assistance Where Assessed-Grooming: Edge of bed Upper Body Bathing: Maximal assistance Where Assessed-Upper Body Bathing: Edge of bed Lower Body Bathing: Dependent Where Assessed-Lower Body Bathing: Bed level Upper Body Dressing: Dependent Where Assessed-Upper Body Dressing: Bed level Lower Body Dressing: Dependent Where Assessed-Lower Body Dressing: Bed level Toileting: Unable to assess Toilet Transfer: Unable to assess Tub/Shower Transfer: Not assessed Social research officer, government: Not assessed Mobility  Bed Mobility Bed Mobility: Supine to Sit;Sit to Supine Supine to Sit: Maximal Assistance - Patient - Patient 25-49% Sit to Supine: 2 Helpers  Session Note: Pt received semi-reclined in bed son present, initially no c/o pain, but fatigued from earlier PT eval, and agreeable to OT eval with encouragement.  Reviewed role of CIR OT, evaluation process, ADL/func mobility retraining, goals for therapy, and safety plan. Evaluation completed as documented above. Came to sitttng EOB on his R with heavy max A of 1 and use of bed rails. Min A to CGA for static sitting balance EOB. Pt able to wash his face CGA for balance. Total A to thread BLE into pants, multiple attempts to come into standing with use of bari RW/stedy and +2 assistance with no clearance. Pt with very poor initiation and sit to stand technique despite multimodal cuing. Total A of 2 to return to supine and position in bed, MD in/out morning rounds. RN present to administer requested pain rx at end of session. Unable to get reading on satO2 due to poor perfusion, on 2L via  [place] throughout session,  Pt left semi-reclined in bed with son/RN present with bed alarm engaged, call bell in reach, and all immediate needs met.  Discharge Criteria: Patient will be discharged from OT if patient refuses treatment 3 consecutive times without medical reason, if treatment goals not  met, if there is a change in medical status, if patient makes no progress towards goals or if patient is discharged from hospital.  The above assessment, treatment plan, treatment alternatives and goals were discussed and mutually agreed upon: by patient and by family  Volanda Napoleon MS, OTR/L  09/20/2021, 12:52 PM

## 2021-09-20 NOTE — Progress Notes (Signed)
PROGRESS NOTE   Subjective/Complaints: Fatigued  Son at bedside Has lower back pain: kpad ordered Discussed CBGs are adequately controlled  ROS: +lower back pain   Objective:   No results found. Recent Labs    09/18/21 0046 09/20/21 0506  WBC 7.9 6.8  HGB 10.4* 10.3*  HCT 31.9* 32.9*  PLT 227 211   Recent Labs    09/18/21 0046 09/20/21 0506  NA 141 146*  K 4.0 4.2  CL 107 108  CO2 29 33*  GLUCOSE 132* 109*  BUN 51* 49*  CREATININE 1.39* 1.47*  CALCIUM 8.8* 9.5    Intake/Output Summary (Last 24 hours) at 09/20/2021 1539 Last data filed at 09/20/2021 1230 Gross per 24 hour  Intake 417 ml  Output --  Net 417 ml     Pressure Injury 09/09/21 Buttocks Medial;Bilateral Deep Tissue Pressure Injury - Purple or maroon localized area of discolored intact skin or blood-filled blister due to damage of underlying soft tissue from pressure and/or shear. purple/red with petechi (Active)  09/09/21 2100  Location: Buttocks  Location Orientation: Medial;Bilateral  Staging: Deep Tissue Pressure Injury - Purple or maroon localized area of discolored intact skin or blood-filled blister due to damage of underlying soft tissue from pressure and/or shear.  Wound Description (Comments): purple/red with petechiae  Present on Admission: No     Pressure Injury 09/17/21 Anus Medial Stage 2 -  Partial thickness loss of dermis presenting as a shallow open injury with a red, pink wound bed without slough. (Active)  09/17/21 1115  Location: Anus  Location Orientation: Medial  Staging: Stage 2 -  Partial thickness loss of dermis presenting as a shallow open injury with a red, pink wound bed without slough.  Wound Description (Comments):   Present on Admission:     Physical Exam: Vital Signs Blood pressure (!) 161/72, pulse 71, temperature (!) 97.5 F (36.4 C), temperature source Oral, resp. rate 19, weight 103 kg, SpO2 98  %.  Vitals and nursing note reviewed. BMI 35.56 HENT:     Head:     Comments: Well healed old crani incision.  Pulmonary:     Comments: Decreased breath sounds.  Abdominal:     General: There is distension.     Tenderness: There is no abdominal tenderness.  Neurological:     Mental Status: He is easily aroused. He is lethargic and disoriented.     Comments: Oriented to self, place, age but thought it was October and persevered on 75 as year of birth. Limited by lethargy but able to follow simple motor commands.    MSK: protracted posture. No focal deficits.  Assessment/Plan: 1. Functional deficits which require 3+ hours per day of interdisciplinary therapy in a comprehensive inpatient rehab setting. Physiatrist is providing close team supervision and 24 hour management of active medical problems listed below. Physiatrist and rehab team continue to assess barriers to discharge/monitor patient progress toward functional and medical goals  Care Tool:  Bathing        Body parts bathed by helper: Face     Bathing assist Assist Level: Minimal Assistance - Patient > 75%     Upper Body Dressing/Undressing Upper body dressing  What is the patient wearing?: Hospital gown only    Upper body assist Assist Level: Total Assistance - Patient < 25%    Lower Body Dressing/Undressing Lower body dressing      What is the patient wearing?: Pants     Lower body assist Assist for lower body dressing: 2 Helpers     Toileting Toileting Toileting Activity did not occur (Clothing management and hygiene only): N/A (no void or bm)  Toileting assist Assist for toileting: Total Assistance - Patient < 25%     Transfers Chair/bed transfer  Transfers assist  Chair/bed transfer activity did not occur: Safety/medical concerns  Chair/bed transfer assist level: Total Assistance - Patient < 25%     Locomotion Ambulation   Ambulation assist      Assist level: Maximal Assistance -  Patient 25 - 49% Assistive device: No Device Max distance: 2   Walk 10 feet activity   Assist  Walk 10 feet activity did not occur: Safety/medical concerns        Walk 50 feet activity   Assist Walk 50 feet with 2 turns activity did not occur: Safety/medical concerns         Walk 150 feet activity   Assist Walk 150 feet activity did not occur: Safety/medical concerns         Walk 10 feet on uneven surface  activity   Assist Walk 10 feet on uneven surfaces activity did not occur: Safety/medical concerns         Wheelchair     Assist        Wheelchair assist level: Dependent - Patient 0%      Wheelchair 50 feet with 2 turns activity    Assist        Assist Level: Dependent - Patient 0%   Wheelchair 150 feet activity     Assist          Blood pressure (!) 161/72, pulse 71, temperature (!) 97.5 F (36.4 C), temperature source Oral, resp. rate 19, weight 103 kg, SpO2 98 %.  Medical Problem List and Plan: 1. Functional deficits secondary to debility from septic shock.             -patient may shower             -ELOS/Goals: S 10-14 days             Initial CIR evaluations today 2.  Antithrombotics: -DVT/anticoagulation:  Pharmaceutical: Other (comment) Eliquis             -antiplatelet therapy: N/A 3. Low back pain: kpad ordered. Ultram or tylenol prn.  4. Mood: LCSW to follow for evaluation and support.              -antipsychotic agents: N/A 5. Neuropsych: This patient is not fully capable of making decisions on his own behalf. 6. Skin/Wound Care:  Air mattress overlay for DTI             --will add juven/vitamins to promote wound healing.  7. Fluids/Electrolytes/Nutrition: Strict I/O. Juven and Ensure max             --Recheck CMET in am 8. Chronic systolic/diastolic CHF: Monitor for signs of overload. Check daily weights.              --Back on Demadex, Entresto, Coreg and Pravacol. Resume Jardiance.              --add Low  salt/HH restrictions.  9. SSS/A fib s/p PPM/ICD:  Monitor HR TID. Monitor for symptoms with increase in activity.  --HR controlled on Eliquis, coreg and amiodarone.   10. CKD: Baseline SCr-1.5 and renal status trending back to baseline with resumption of diuretics.              --continue to monitor with serial checks.  11. Pre-diabetes/Hyperglycemia: Hgb A1C- 5.7. Jardiance on hold due to infection.  --Elevated BS likely due to steroids/illness.  --Will change Ensure to Ensure Max/Add CM restrictions             --Resume Jardiance in am and wean off insulin glargine.  12. COPD: Respiratory status stable--to resume Dulera.  --Wean oxgyen as tolerated. Has been oxygen dependent at nights.              --encourage pulmonary hygiene.  13. BPH/Nephrolithiasis: Urinary retention at admission. Continue Flomax and Proscar.              --follow up with Dr. Thalia Bloodgood after discharge.  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  Morbid Obesity: Educate on wt loss/appropriate diet to promote overall health and mobility.  4. Screening for vitamin D deficiency: Vitamin D 54, 1,000U daily supplement started 17. History of hypermagnesemia: repeated 5/20 and is stable at 2.1 18. Hypertension: increase flomax to 0,8mg  and change to after supper.     LOS: 1 days A FACE TO FACE EVALUATION WAS PERFORMED  Ricky Hayden 09/20/2021, 3:39 PM

## 2021-09-20 NOTE — Plan of Care (Signed)
  Problem: RH Swallowing Goal: LTG Patient will consume least restrictive diet using compensatory strategies with assistance (SLP) Description: LTG:  Patient will consume least restrictive diet using compensatory strategies with assistance (SLP) Flowsheets (Taken 09/20/2021 1424) LTG: Pt Patient will consume least restrictive diet using compensatory strategies with assistance of (SLP): Supervision Goal: LTG Pt will demonstrate functional change in swallow as evidenced by bedside/clinical objective assessment (SLP) Description: LTG: Patient will demonstrate functional change in swallow as evidenced by bedside/clinical objective assessment (SLP) Flowsheets (Taken 09/20/2021 1424) LTG: Patient will demonstrate functional change in swallow as evidenced by bedside/clinical objective assessment: Oropharyngeal swallow   Problem: RH Cognition - SLP Goal: RH LTG Patient will demonstrate orientation with cues Description:  LTG:  Patient will demonstrate orientation to person/place/time/situation with cues (SLP)   Flowsheets (Taken 09/20/2021 1424) LTG: Patient will demonstrate orientation using cueing (SLP): Supervision   Problem: RH Problem Solving Goal: LTG Patient will demonstrate problem solving for (SLP) Description: LTG:  Patient will demonstrate problem solving for basic/complex daily situations with cues  (SLP) Flowsheets (Taken 09/20/2021 1424) LTG: Patient will demonstrate problem solving for (SLP): Basic daily situations LTG Patient will demonstrate problem solving for: Minimal Assistance - Patient > 75%   Problem: RH Memory Goal: LTG Patient will demonstrate ability for day to day (SLP) Description: LTG:   Patient will demonstrate ability for day to day recall/carryover during cognitive/linguistic activities with assist  (SLP) Flowsheets (Taken 09/20/2021 1424) LTG: Patient will demonstrate ability for day to day recall: New information LTG: Patient will demonstrate ability for day to day  recall/carryover during cognitive/linguistic activities with assist (SLP): Minimal Assistance - Patient > 75% Goal: LTG Patient will use memory compensatory aids to (SLP) Description: LTG:  Patient will use memory compensatory aids to recall biographical/new, daily complex information with cues (SLP) Flowsheets (Taken 09/20/2021 1424) LTG: Patient will use memory compensatory aids to (SLP): Minimal Assistance - Patient > 75%   Problem: RH Attention Goal: LTG Patient will demonstrate this level of attention during functional activites (SLP) Description: LTG:  Patient will will demonstrate this level of attention during functional activites (SLP) Flowsheets (Taken 09/20/2021 1424) Patient will demonstrate during cognitive/linguistic activities the attention type of: Sustained Patient will demonstrate this level of attention during cognitive/linguistic activities in: Home LTG: Patient will demonstrate this level of attention during cognitive/linguistic activities with assistance of (SLP): Minimal Assistance - Patient > 75%

## 2021-09-21 LAB — GLUCOSE, CAPILLARY
Glucose-Capillary: 84 mg/dL (ref 70–99)
Glucose-Capillary: 112 mg/dL — ABNORMAL HIGH (ref 70–99)
Glucose-Capillary: 97 mg/dL (ref 70–99)
Glucose-Capillary: 119 mg/dL — ABNORMAL HIGH (ref 70–99)

## 2021-09-21 MED ORDER — CARVEDILOL 3.125 MG PO TABS
3.1250 mg | ORAL_TABLET | Freq: Two times a day (BID) | ORAL | Status: DC
  Administered 2021-09-21 – 2021-09-25 (×8): 3.125 mg via ORAL
  Filled 2021-09-21 (×8): qty 1

## 2021-09-21 NOTE — Progress Notes (Signed)
Daily Progress Note   Patient Name: Ricky Hayden       Date: 09/21/2021 DOB: 01/31/1946  Age: 76 y.o. MRN#: [id number] Attending Physician: Jennye Boroughs, MD Primary Care Physician: [facility] Admit Date: 09/19/2021  Reason for Consultation/Follow-up: Establishing goals of care  Subjective: Medical records reviewed including progress notes, labs. Patient assessed at the bedside. Discussed with RN. He reports 25/10 pain, over the course of scheduled family meeting pain is improved with PRN tramadol.  His 2 daughters son-in-law, granddaughter are present for the family meeting.  I introduced palliative care as specialized medicine focused on improving quality of life and ensuring care is aligned with patient's specific goals of care and treatment preferences.  Created space and opportunity for patient's thoughts and feelings on his current illness.  We reviewed his recent hospitalization including kidney stones, septic shock, intubation and cardiac arrest, and question of tracheostomy with ultimately extubation after 10 days.  Reviewed the long road of recovery ahead with potential for further complications and counseled patient it is important to discuss his preferences with his family while he has the opportunity.  We reviewed uncertain prognosis given patient's underlying chronic comorbidities.  Patient feels that he is miserable during hospitalizations discussed whether he would ever want to go through another hospital stay if he declines rather than improves.  He initially states he does not want to, but then states he would be willing to if he could achieve "relief" and recover again.  Discussed whether he would ever want ICU for aggressive care such as reintubation and CPR.   Last week he was expressing he would not want this however today it is difficult for him to express a preference one way or another.  Patient's family is aware that his goals and needs may vastly differ over the next week based on his experience with further therapies.  He is a strong-willed person who is not afraid to express if he is experiencing intolerable suffering.  We discussed the benefits of outpatient palliative care follow-up and family is agreeable.  Discussed the option of a another family meeting next weekend.  Questions and concerns addressed. PMT will continue to support holistically.   Length of Stay: 2   Physical Exam Vitals and nursing note reviewed.  Constitutional:      General: He is  not in acute distress.    Appearance: He is obese.     Interventions: Nasal cannula in place.  Cardiovascular:     Rate and Rhythm: Normal rate.  Pulmonary:     Effort: Pulmonary effort is normal.  Neurological:     Mental Status: He is alert. He is confused.     Motor: Weakness present.  Psychiatric:        Mood and Affect: Mood normal.        Behavior: Behavior is cooperative.            Vital Signs: BP 98/69 (BP Location: Left Arm)   Pulse 77   Temp 97.9 F (36.6 C) (Oral)   Resp 19   Wt 129 kg   SpO2 100%   BMI 44.54 kg/m  SpO2: SpO2: 100 % O2 Device: O2 Device: Nasal Cannula O2 Flow Rate: O2 Flow Rate (L/min): 2 L/min      Palliative Assessment/Data:       Palliative Care Assessment & Plan   Patient Profile: 76 y.o. 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   Patient was admitted for sepsis from UTI and required BiPAP in the ED. Patient was weaned off but then on 5/2 had decreased level of consciousness and increased oxygen requirement, placed back on BiPAP, became  hypotensive and intubated for worsened mental status. Patient was extubated on 5/12. PMT has been consulted to assist with goals of care conversation.    Assessment: Goals of care conversation Functional deficits secondary to debility from septic shock PPM/ICD CKD COPD CHF  Recommendations/Plan: Continue full code/full scope treatment  Psychosocial and emotional support provided Patient would benefit from palliative care support at discharge and family is agreeable PMT will continue to follow for ongoing discussion based on patient progress   Prognosis:  Unable to determine  Discharge Planning: To Be Determined  Care plan was discussed with patient, patient's family, RN   Total time: I spent 70 minutes in the care of the patient today in the above activities and documenting the encounter.         Sharron Simpson Johnnette Litter, PA-C  Palliative Medicine Team Team phone # [phone]  Thank you for allowing the Palliative Medicine Team to assist in the care of this patient. Please utilize secure chat with additional questions, if there is no response within 30 minutes please call the above phone number.  Palliative Medicine Team providers are available by phone from 7am to 7pm daily and can be reached through the team cell phone.  Should this patient require assistance outside of these hours, please call the patient's attending physician.

## 2021-09-21 NOTE — Progress Notes (Signed)
PROGRESS NOTE   Subjective/Complaints: Has had a good morning so far Slept well last night Kpad helped with his back pain a lot  ROS: +lower back pain- improved with kpad   Objective:   No results found. Recent Labs    09/20/21 0506  WBC 6.8  HGB 10.3*  HCT 32.9*  PLT 211   Recent Labs    09/20/21 0506  NA 146*  K 4.2  CL 108  CO2 33*  GLUCOSE 109*  BUN 49*  CREATININE 1.47*  CALCIUM 9.5    Intake/Output Summary (Last 24 hours) at 09/21/2021 1145 Last data filed at 09/21/2021 5102 Gross per 24 hour  Intake 637 ml  Output 250 ml  Net 387 ml     Pressure Injury 09/09/21 Buttocks Medial;Bilateral Deep Tissue Pressure Injury - Purple or maroon localized area of discolored intact skin or blood-filled blister due to damage of underlying soft tissue from pressure and/or shear. purple/red with petechi (Active)  09/09/21 2100  Location: Buttocks  Location Orientation: Medial;Bilateral  Staging: Deep Tissue Pressure Injury - Purple or maroon localized area of discolored intact skin or blood-filled blister due to damage of underlying soft tissue from pressure and/or shear.  Wound Description (Comments): purple/red with petechiae  Present on Admission: No     Pressure Injury 09/17/21 Anus Medial Stage 2 -  Partial thickness loss of dermis presenting as a shallow open injury with a red, pink wound bed without slough. (Active)  09/17/21 1115  Location: Anus  Location Orientation: Medial  Staging: Stage 2 -  Partial thickness loss of dermis presenting as a shallow open injury with a red, pink wound bed without slough.  Wound Description (Comments):   Present on Admission:     Physical Exam: Vital Signs Blood pressure 98/69, pulse 77, temperature 97.9 F (36.6 C), temperature source Oral, resp. rate 19, weight 129 kg, SpO2 100 %.  Vitals and nursing note reviewed. BMI 35.56 HENT:     Head:     Comments: Well  healed old crani incision.  Pulmonary:     Comments: Decreased breath sounds.  Abdominal:     General: There is distension.     Tenderness: There is no abdominal tenderness.  Neurological:     Mental Status: He is easily aroused. He is lethargic and disoriented.     Comments: Oriented to self, place, age, able to state month is May, persevered on 61 as year of birth. Limited by lethargy but able to follow simple motor commands.    MSK: protracted posture. No focal deficits.  Assessment/Plan: 1. Functional deficits which require 3+ hours per day of interdisciplinary therapy in a comprehensive inpatient rehab setting. Physiatrist is providing close team supervision and 24 hour management of active medical problems listed below. Physiatrist and rehab team continue to assess barriers to discharge/monitor patient progress toward functional and medical goals  Care Tool:  Bathing        Body parts bathed by helper: Face     Bathing assist Assist Level: Minimal Assistance - Patient > 75%     Upper Body Dressing/Undressing Upper body dressing   What is the patient wearing?: Hospital gown only  Upper body assist Assist Level: Total Assistance - Patient < 25%    Lower Body Dressing/Undressing Lower body dressing      What is the patient wearing?: Pants     Lower body assist Assist for lower body dressing: 2 Helpers     Toileting Toileting Toileting Activity did not occur (Clothing management and hygiene only): N/A (no void or bm)  Toileting assist Assist for toileting: Total Assistance - Patient < 25%     Transfers Chair/bed transfer  Transfers assist  Chair/bed transfer activity did not occur: Safety/medical concerns  Chair/bed transfer assist level: Total Assistance - Patient < 25%     Locomotion Ambulation   Ambulation assist      Assist level: Maximal Assistance - Patient 25 - 49% Assistive device: No Device Max distance: 2   Walk 10 feet  activity   Assist  Walk 10 feet activity did not occur: Safety/medical concerns        Walk 50 feet activity   Assist Walk 50 feet with 2 turns activity did not occur: Safety/medical concerns         Walk 150 feet activity   Assist Walk 150 feet activity did not occur: Safety/medical concerns         Walk 10 feet on uneven surface  activity   Assist Walk 10 feet on uneven surfaces activity did not occur: Safety/medical concerns         Wheelchair     Assist        Wheelchair assist level: Dependent - Patient 0%      Wheelchair 50 feet with 2 turns activity    Assist        Assist Level: Dependent - Patient 0%   Wheelchair 150 feet activity     Assist          Blood pressure 98/69, pulse 77, temperature 97.9 F (36.6 C), temperature source Oral, resp. rate 19, weight 129 kg, SpO2 100 %.  Medical Problem List and Plan: 1. Functional deficits secondary to debility from septic shock.             -patient may shower             -ELOS/Goals: S 10-14 days             Continue CIR 2.  Antithrombotics: -DVT/anticoagulation:  Pharmaceutical: Other (comment) Eliquis             -antiplatelet therapy: N/A 3. Low back pain: kpad ordered. Continue Ultram or tylenol prn.  4. Mood: LCSW to follow for evaluation and support.              -antipsychotic agents: N/A 5. Neuropsych: This patient is not fully capable of making decisions on his own behalf. 6. Skin/Wound Care:  Air mattress overlay for DTI             --will add juven/vitamins to promote wound healing.  7. Fluids/Electrolytes/Nutrition: Strict I/O. Juven and Ensure max             --Recheck CMET in am 8. Chronic systolic/diastolic CHF: Monitor for signs of overload. Check daily weights.              --Back on Demadex, Entresto, Coreg and Pravacol. Resume Jardiance.              --add Low salt/HH restrictions.  9. SSS/A fib s/p PPM/ICD: Monitor HR TID. Monitor for symptoms with  increase in activity.  --HR controlled on  Eliquis, coreg and amiodarone.   10. CKD: Baseline SCr-1.5 and renal status trending back to baseline with resumption of diuretics.              --continue to monitor with serial checks.  11. Pre-diabetes/Hyperglycemia: Hgb A1C- 5.7. Jardiance on hold due to infection.  --Elevated BS likely due to steroids/illness.  --Will change Ensure to Ensure Max/Add CM restrictions             --Resume Jardiance in am and wean off insulin glargine.  12. COPD: Respiratory status stable--to resume Dulera.  --Wean oxgyen as tolerated. Has been oxygen dependent at nights.              --encourage pulmonary hygiene.  13. BPH/Nephrolithiasis: Urinary retention at admission. Continue Flomax and Proscar.              --follow up with Dr. Thalia Bloodgood after discharge.  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  Morbid Obesity: Educate on wt loss/appropriate diet to promote overall health and mobility.  43. Screening for vitamin D deficiency: Vitamin D 54, 1,000U daily supplement started 17. History of hypermagnesemia: repeated 5/20 and is stable at 2.1 18. Hypertension: increase flomax to 0,8mg  and change to after supper. Improved, decrease Coreg to 3.125mg  BID. 19. Hypoalbuminemia: continue Juven.     LOS: 2 days A FACE TO FACE EVALUATION WAS PERFORMED  Martha Clan P Manisha Cancel 09/21/2021, 11:45 AM

## 2021-09-22 DIAGNOSIS — E1169 Type 2 diabetes mellitus with other specified complication: Secondary | ICD-10-CM

## 2021-09-22 DIAGNOSIS — E8809 Other disorders of plasma-protein metabolism, not elsewhere classified: Secondary | ICD-10-CM

## 2021-09-22 LAB — GLUCOSE, CAPILLARY
Glucose-Capillary: 104 mg/dL — ABNORMAL HIGH (ref 70–99)
Glucose-Capillary: 115 mg/dL — ABNORMAL HIGH (ref 70–99)
Glucose-Capillary: 118 mg/dL — ABNORMAL HIGH (ref 70–99)
Glucose-Capillary: 120 mg/dL — ABNORMAL HIGH (ref 70–99)

## 2021-09-22 MED ORDER — TORSEMIDE 20 MG PO TABS: 20.0000 mg | ORAL_TABLET | Freq: Two times a day (BID) | ORAL | Status: DC

## 2021-09-22 MED ORDER — INSULIN GLARGINE-YFGN 100 UNIT/ML ~~LOC~~ SOLN
8.0000 [IU] | Freq: Every day | SUBCUTANEOUS | Status: DC
  Administered 2021-09-23: 8 [IU] via SUBCUTANEOUS
  Filled 2021-09-22 (×2): qty 0.08

## 2021-09-22 MED ORDER — TORSEMIDE 20 MG PO TABS: 20.0000 mg | ORAL_TABLET | Freq: Every day | ORAL | Status: DC

## 2021-09-22 NOTE — Plan of Care (Signed)
  Problem: Consults Goal: RH GENERAL PATIENT EDUCATION Description: See Patient Education module for education specifics.  Outcome: Progressing   Problem: RH BLADDER ELIMINATION Goal: RH STG MANAGE BLADDER WITH ASSISTANCE Description: STG Manage Bladder With Assistance Mod assist Outcome: Progressing   Problem: RH SAFETY Goal: RH STG ADHERE TO SAFETY PRECAUTIONS W/ASSISTANCE/DEVICE Description: STG Adhere to Safety Precautions With Assistance/Device. Mod assist Outcome: Progressing   Problem: RH PAIN MANAGEMENT Goal: RH STG PAIN MANAGED AT OR BELOW PT'S PAIN GOAL Description: Less than 4 Outcome: Progressing   Problem: RH KNOWLEDGE DEFICIT GENERAL Goal: RH STG INCREASE KNOWLEDGE OF SELF CARE AFTER HOSPITALIZATION Description: Patient/family will be able to describe self care after hospitalization with cues/handouts Outcome: Progressing   Problem: RH SKIN INTEGRITY Goal: RH STG SKIN FREE OF INFECTION/BREAKDOWN Description: Patient remain free of further skin breakdown and manage skin conditions with mod assist Outcome: Progressing   Problem: RH BOWEL ELIMINATION Goal: RH STG MANAGE BOWEL WITH ASSISTANCE Description: STG Manage Bowel with Assistance. Mod Outcome: Progressing

## 2021-09-22 NOTE — Progress Notes (Signed)
[facility] Individual Statement of Services  Patient Name:  Safal Halderman  Date:  09/22/2021  Welcome to the [facility].  Our goal is to provide you with an individualized program based on your diagnosis and situation, designed to meet your specific needs.  With this comprehensive rehabilitation program, you will be expected to participate in at least 3 hours of rehabilitation therapies Monday-Friday, with modified therapy programming on the weekends.  Your rehabilitation program will include the following services:  Physical Therapy (PT), Occupational Therapy (OT), Speech Therapy (ST), 24 hour per day rehabilitation nursing, Therapeutic Recreaction (TR), Neuropsychology, Care Coordinator, Rehabilitation Medicine, Nutrition Services, Pharmacy Services, and Other  Weekly team conferences will be held on Wednesdays to discuss your progress.  Your Inpatient Rehabilitation Care Coordinator will talk with you frequently to get your input and to update you on team discussions.  Team conferences with you and your family in attendance may also be held.  Expected length of stay:  14-16 Days   Overall anticipated outcome: Supervision to Min A  Depending on your progress and recovery, your program may change. Your Inpatient Rehabilitation Care Coordinator will coordinate services and will keep you informed of any changes. Your Inpatient Rehabilitation Care Coordinator's name and contact numbers are listed  below.  The following services may also be recommended but are not provided by the [facility]:   [place] will be made to provide these services after discharge if needed.  Arrangements include referral to agencies that provide these services.  Your insurance has been verified to be:   [place] primary doctor is:   [facility]  Pertinent information will be shared with your doctor and your insurance company.  Inpatient Rehabilitation Care Coordinator:  Ovidio Kin, [place] or Emilia Beck  Information discussed with and copy given to patient by: Dyanne Iha, 09/22/2021, 12:14 PM

## 2021-09-22 NOTE — Progress Notes (Signed)
PROGRESS NOTE   Subjective/Complaints: No new complaints or concerns this AM. Reports he feels well overall.   ROS: no CP or SOB, +lower back pain- improved with kpad   Objective:   No results found. Recent Labs    09/20/21 0506  WBC 6.8  HGB 10.3*  HCT 32.9*  PLT 211    Recent Labs    09/20/21 0506  NA 146*  K 4.2  CL 108  CO2 33*  GLUCOSE 109*  BUN 49*  CREATININE 1.47*  CALCIUM 9.5     Intake/Output Summary (Last 24 hours) at 09/22/2021 0732 Last data filed at 09/22/2021 0456 Gross per 24 hour  Intake 480 ml  Output 400 ml  Net 80 ml      Pressure Injury 09/09/21 Buttocks Medial;Bilateral Deep Tissue Pressure Injury - Purple or maroon localized area of discolored intact skin or blood-filled blister due to damage of underlying soft tissue from pressure and/or shear. purple/red with petechi (Active)  09/09/21 2100  Location: Buttocks  Location Orientation: Medial;Bilateral  Staging: Deep Tissue Pressure Injury - Purple or maroon localized area of discolored intact skin or blood-filled blister due to damage of underlying soft tissue from pressure and/or shear.  Wound Description (Comments): purple/red with petechiae  Present on Admission: No     Pressure Injury 09/17/21 Anus Medial Stage 2 -  Partial thickness loss of dermis presenting as a shallow open injury with a red, pink wound bed without slough. (Active)  09/17/21 1115  Location: Anus  Location Orientation: Medial  Staging: Stage 2 -  Partial thickness loss of dermis presenting as a shallow open injury with a red, pink wound bed without slough.  Wound Description (Comments):   Present on Admission:     Physical Exam: Vital Signs Blood pressure (!) 95/35, pulse 60, temperature 98.2 F (36.8 C), resp. rate 18, height 5\' 7"  (1.702 m), weight 134 kg, SpO2 94 %.  Vitals and nursing note reviewed. BMI 35.56 HENT:     Head: Well healed old crani  incision.  Pulmonary:     Comments: CTAB, normal effort, 2L [place] Cardiac: RRR Abdominal:     General: There is distension.     Tenderness: There is no abdominal tenderness.  Neurological:     Mental Status: Alert, follows commands MSK: protracted posture. No focal deficits.  Assessment/Plan: 1. Functional deficits which require 3+ hours per day of interdisciplinary therapy in a comprehensive inpatient rehab setting. Physiatrist is providing close team supervision and 24 hour management of active medical problems listed below. Physiatrist and rehab team continue to assess barriers to discharge/monitor patient progress toward functional and medical goals  Care Tool:  Bathing        Body parts bathed by helper: Face     Bathing assist Assist Level: Minimal Assistance - Patient > 75%     Upper Body Dressing/Undressing Upper body dressing   What is the patient wearing?: Hospital gown only    Upper body assist Assist Level: Moderate Assistance - Patient 50 - 74%    Lower Body Dressing/Undressing Lower body dressing      What is the patient wearing?: Incontinence brief     Lower  body assist Assist for lower body dressing: Maximal Assistance - Patient 25 - 49%     Toileting Toileting Toileting Activity did not occur (Clothing management and hygiene only): N/A (no void or bm)  Toileting assist Assist for toileting: Total Assistance - Patient < 25%     Transfers Chair/bed transfer  Transfers assist  Chair/bed transfer activity did not occur: Safety/medical concerns  Chair/bed transfer assist level: 2 Helpers     Locomotion Ambulation   Ambulation assist      Assist level: Maximal Assistance - Patient 25 - 49% Assistive device: No Device Max distance: 2   Walk 10 feet activity   Assist  Walk 10 feet activity did not occur: Safety/medical concerns        Walk 50 feet activity   Assist Walk 50 feet with 2 turns activity did not occur: Safety/medical  concerns         Walk 150 feet activity   Assist Walk 150 feet activity did not occur: Safety/medical concerns         Walk 10 feet on uneven surface  activity   Assist Walk 10 feet on uneven surfaces activity did not occur: Safety/medical concerns         Wheelchair     Assist        Wheelchair assist level: Dependent - Patient 0%      Wheelchair 50 feet with 2 turns activity    Assist        Assist Level: Dependent - Patient 0%   Wheelchair 150 feet activity     Assist          Blood pressure (!) 95/35, pulse 60, temperature 98.2 F (36.8 C), resp. rate 18, height 5\' 7"  (1.702 m), weight 134 kg, SpO2 94 %.  Medical Problem List and Plan: 1. Functional deficits secondary to debility from septic shock.             -patient may shower             -ELOS/Goals: S 10-14 days             Continue CIR PT/OT/SLP  -He is on mech soft diet with thin liquids with SLP 2.  Antithrombotics: -DVT/anticoagulation:  Pharmaceutical: Other (comment) Eliquis             -antiplatelet therapy: N/A 3. Low back pain: kpad ordered. Continue Ultram or tylenol prn.  4. Mood: LCSW to follow for evaluation and support.              -antipsychotic agents: N/A 5. Neuropsych: This patient is not fully capable of making decisions on his own behalf. 6. Skin/Wound Care:  Air mattress overlay for DTI             --will add juven/vitamins to promote wound healing.  7. Fluids/Electrolytes/Nutrition: Strict I/O. Juven and Ensure max             --Recheck CMET in am 8. Chronic systolic/diastolic CHF: Monitor for signs of overload. Check daily weights.              --Back on Demadex, Entresto, Coreg and Pravacol. Resume Jardiance.              --add Low salt/HH restrictions.  9. SSS/A fib s/p PPM/ICD: Monitor HR TID. Monitor for symptoms with increase in activity.  --HR controlled on Eliquis, coreg and amiodarone.   10. CKD: Baseline SCr-1.5 and renal status trending  back to baseline with resumption  of diuretics.              --continue to monitor with serial checks.  11. Pre-diabetes/Hyperglycemia: Hgb A1C- 5.7. Jardiance on hold due to infection.  --Elevated BS likely due to steroids/illness.  --Will change Ensure to Ensure Max/Add CM restrictions             --Resume Jardiance in am and wean off insulin glargine.   -5/22 glucose well controlled, decrease semglee to 8units 12. COPD: Respiratory status stable--to resume Dulera.  --Wean oxgyen as tolerated. Has been oxygen dependent at nights.              --encourage pulmonary hygiene.  13. BPH/Nephrolithiasis: Urinary retention at admission. Continue Flomax and Proscar.              --follow up with Dr. Thalia Bloodgood after discharge.  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  Morbid Obesity: Educate on wt loss/appropriate diet to promote overall health and mobility.  33. Screening for vitamin D deficiency: Vitamin D 54, 1,000U daily supplement started 17. History of hypermagnesemia: repeated 5/20 and is stable at 2.1 18. Hypertension: increase flomax to 0,8mg  and change to after supper. Improved, decrease Coreg to 3.125mg  BID.  -5/22 continue current medications, monitor for symptoms of orthostatic hypotension 19. Hypoalbuminemia: continue Juven.   -5/22 not eating all his meals, continue juven and ensure max    LOS: 3 days A FACE TO FACE EVALUATION WAS PERFORMED  Jennye Boroughs 09/22/2021, 7:32 AM

## 2021-09-22 NOTE — IPOC Note (Signed)
Overall Plan of Care [facility]) Patient Details Name: Ricky Hayden MRN: [id number] DOB: 1946/03/03  Admitting Diagnosis: Debility  Hospital Problems: Principal Problem:   Debility     Functional Problem List: Nursing Bladder, Bowel, Medication Management, Nutrition, Pain, Skin Integrity  PT Balance, Safety, Perception, Endurance, Motor  OT Balance, Behavior, Cognition, Endurance, Motor, Pain, Safety  SLP Cognition, Safety, Nutrition  TR         Basic ADL's: OT Eating, Grooming, Bathing, Dressing, Toileting     Advanced  ADL's: OT       Transfers: PT Bed Mobility, Bed to Chair, Car  OT Toilet, Tub/Shower     Locomotion: PT Ambulation, Emergency planning/management officer, Stairs     Additional Impairments: OT Fuctional Use of Upper Extremity  SLP Social Cognition, Swallowing   Problem Solving, Memory, Attention, Awareness  TR      Anticipated Outcomes Item Anticipated Outcome  Self Feeding set-up A  Swallowing  Supervision   Basic self-care  S to mod A  Toileting  min A   Bathroom Transfers min A  Bowel/Bladder  regain continence and mange bowel and bladder  Transfers  min A transfers  Locomotion  min A gait, min A stairs, S w/c mobility  Communication     Cognition  Min A  Pain  less than 4  Safety/Judgment  No falls, skin breakdown and infection   Therapy Plan: PT Intensity: Minimum of 1-2 x/day ,45 to 90 minutes PT Frequency: 5 out of 7 days PT Duration Estimated Length of Stay: 25 to 28 days OT Intensity: Minimum of 1-2 x/day, 45 to 90 minutes OT Frequency: 5 out of 7 days OT Duration/Estimated Length of Stay: 3.5-4 weeks SLP Intensity: Minumum of 1-2 x/day, 30 to 90 minutes SLP Frequency: 3 to 5 out of 7 days SLP Duration/Estimated Length of Stay: 3.5-4 weeks   Team Interventions: Nursing Interventions Patient/Family Education, Bladder Management, Bowel Management, Medication Management, Pain Management, Skin Care/Wound Management, Cognitive  Remediation/Compensation, Discharge Planning  PT interventions Ambulation/gait training, Balance/vestibular training, Discharge planning, Community reintegration, DME/adaptive equipment instruction, Neuromuscular re-education, Functional mobility training, Patient/family education, Stair training, Splinting/orthotics, Therapeutic Activities, Therapeutic Exercise, UE/LE Strength taining/ROM, UE/LE Coordination activities, Visual/perceptual remediation/compensation, Wheelchair propulsion/positioning  OT Interventions Training and development officer, Disease mangement/prevention, Neuromuscular re-education, Self Care/advanced ADL retraining, Therapeutic Exercise, Wheelchair propulsion/positioning, UE/LE Strength taining/ROM, Pain management, DME/adaptive equipment instruction, Cognitive remediation/compensation, Academic librarian, Barrister's clerk education, UE/LE Coordination activities, Therapeutic Activities, Psychosocial support, Functional mobility training, Discharge planning  SLP Interventions Cognitive remediation/compensation, Environmental controls, Internal/external aids, Therapeutic Exercise, Therapeutic Activities, Patient/family education, Functional tasks, Dysphagia/aspiration precaution training, Cueing hierarchy  TR Interventions    SW/CM Interventions Discharge Planning, Psychosocial Support, Patient/Family Education, Disease Management/Prevention   Barriers to Discharge MD  Medical stability, Home enviroment access/loayout, and Nutritional means  Nursing      PT Inaccessible home environment, Decreased caregiver support    OT Decreased caregiver support, Inaccessible home environment, Home environment access/layout    SLP      SW Decreased caregiver support, Home environment access/layout     Team Discharge Planning: Destination: PT-Skilled [facility] (SNF) ,OT- Home , SLP-Home Projected Follow-up: PT-Skilled nursing facility, Home health PT, [place], Skilled  nursing facility, 24 hour supervision/assistance, SLP-Home Health SLP, Outpatient SLP Projected Equipment Needs: PT- To Be determined, OT- To be determined, SLP-To be determined Equipment Details: PT- , OT-  Patient/family involved in discharge planning: PT- Patient,  OT-Patient, Family member/caregiver, SLP-Patient, Family member/caregiver  MD ELOS: 14-16 Medical Rehab Prognosis:  Good Assessment: The patient has been admitted for CIR therapies with the diagnosis of debility from septic shock. The team will be addressing functional mobility, strength, stamina, balance, safety, adaptive techniques and equipment, self-care, bowel and bladder mgt, patient and caregiver education. Goals have been set at supervision/Min A. Anticipated discharge destination is home        See Team Conference Notes for weekly updates to the plan of care

## 2021-09-22 NOTE — Progress Notes (Signed)
Physical Therapy Session Note  Patient Details  Name: Ricky Hayden MRN: [id number] Date of Birth: Aug 04, 1945  Today's Date: 09/22/2021 PT Individual Time: 1017-1048 PT Individual Time Calculation (min): 31 min   Short Term Goals: Week 1:  PT Short Term Goal 1 (Week 1): Pt will increase rolling to mod A. PT Short Term Goal 2 (Week 1): Pt will increase supine to edge of bed to mod A. PT Short Term Goal 3 (Week 1): Pt will increase transfers bed to chair to mod A. PT Short Term Goal 4 (Week 1): Pt will increase ambulation with LRAD about 10 feet with mod A. PT Short Term Goal 5 (Week 1): Pt will ascend/descend 4 stairs with B rails and mod A.  Skilled Therapeutic Interventions/Progress Updates:    pt received in bed and agreeable to therapy. Pt reports he is pain, but unable to articulate where or NPRS. When asked where, states, "my body." Pt states, "ow" when moving BLE or when seated on stedy and stedy is moved. Supine>sit with max A for BLE management and trunk elevation. Max, step by step cueing required for all mobility. Sitting balance with supervision. Attempted Sit to stand to RW without success. Sit to stand with stedy and max A. Pt able to maintain standing once upright with CGA. Performed Sit to stand from stedy paddles x 3 with extended rest break before sitting back to w/c. Extended time spent instructing pt on scooting back in w/c. At this time, handed over to [place] for session with pt seated in w/c.   Therapy Documentation Precautions:  Precautions Precautions: Fall Precaution Comments: 2L O2 Restrictions Weight Bearing Restrictions: No General:      Therapy/Group: Individual Therapy  Mickel Fuchs 09/22/2021, 12:46 PM

## 2021-09-22 NOTE — Progress Notes (Signed)
Occupational Therapy Session Note  Patient Details  Name: Ricky Hayden MRN: [id number] Date of Birth: 1945-10-05  Today's Date: 09/22/2021 OT Individual Time: 1045-1200 OT Individual Time Calculation (min): 75 min    Short Term Goals: Week 1:  OT Short Term Goal 1 (Week 1): Pt will sit EOB for >5 min to complete ADL with min A for balance. OT Short Term Goal 2 (Week 1): Pt will complete toilet transfer with max A and LRAD. OT Short Term Goal 3 (Week 1): Pt will complete grooming activity at sink with MIN a. OT Short Term Goal 4 (Week 1): Pt will don shirt with max A.  Skilled Therapeutic Interventions/Progress Updates:    Pt received in hand off from PT.  Nursing needed to perform a skin assessment so set pt up with stedy lift.  Pt had very impaired initiation and follow through so ultimately needed +2 A with MAX A to come to stand.  Pt then was able to stand fully upright in stedy with CGA.    Pt sat on pads of stedy and taken to sink. With oral care, pt did brush teeth but tried numerous times to cue pt to stand up in stedy to spit out into the sink.  3x he did not initiate and just spit onto the sink counter.  Handed pt a cloth to clean up which he did.  Continuing in the high perched position on stedy, pt washed UB with max cues for initiation and mod A.  Pt could stand up from perched position with mod A but extra time needed.  Pt kept saying he was tired,  +2 A to transfer to EOB with stedy and then move to supine. Pt worked on rolling L and R with max A and cues to use his legs by pushing through his feet, He c/o knee pain in B legs with knee flexion.  Adjusted up in bed.   Cognition training with orientation, why he is here. 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    Pt did do some basic UE sh exercises using a 2# dowel bar with max cues for follow through.   Toileting is not safe at this time, due to weakness and limited initiation.  Recommend bed pan for now.   O2 sats 100% on  2L.   Pt resting in bed with all needs met and call light and phone in reach.    Therapy Documentation Precautions:  Precautions Precautions: Fall Precaution Comments: 2L O2 Restrictions Weight Bearing Restrictions: No    Vital Signs: Therapy Vitals Temp: 98.2 F (36.8 C) Pulse Rate: 60 Resp: 18 BP: (!) 95/35 Patient Position (if appropriate): Lying Oxygen Therapy SpO2: 94 % O2 Device: Room Air Pain: Pain Assessment Pain Scale: 0-10 Pain Score: 0-No pain ADL: ADL Eating: Moderate assistance Where Assessed-Eating: Bed level Grooming: Minimal assistance Where Assessed-Grooming: Edge of bed Upper Body Bathing: Maximal assistance Where Assessed-Upper Body Bathing: Edge of bed Lower Body Bathing: Dependent Where Assessed-Lower Body Bathing: Bed level Upper Body Dressing: Dependent Where Assessed-Upper Body Dressing: Bed level Lower Body Dressing: Dependent Where Assessed-Lower Body Dressing: Bed level Toileting: Unable to assess Toilet Transfer: Unable to assess Tub/Shower Transfer: Not assessed [employer] Transfer: Not assessed  Therapy/Group: Individual Therapy  Rudolph 09/22/2021, 8:32 AM

## 2021-09-22 NOTE — Progress Notes (Signed)
Inpatient Rehabilitation  Patient information reviewed and entered into eRehab system by Tomesha Sargent [name], OTR/L.   Information including medical coding, functional ability and quality indicators will be reviewed and updated through discharge.    

## 2021-09-22 NOTE — Progress Notes (Signed)
Speech Language Pathology Daily Session Note  Patient Details  Name: Ricky Hayden MRN: [id number] Date of Birth: [birth year]  Today's Date: 09/22/2021 SLP Individual Time: 0900-0957 SLP Individual Time Calculation (min): 57 min  Short Term Goals: Week 1: SLP Short Term Goal 1 (Week 1): Pt will attend to functional tasks for 8-10 minutes with mod A cues for support SLP Short Term Goal 2 (Week 1): Pt will recall functional information with mod A cues for compensatory strategies SLP Short Term Goal 3 (Week 1): Pt will increase orientation x4 with mod A cues for use of external aids SLP Short Term Goal 4 (Week 1): Pt will solve basic problems during functional tasks with mod A cues SLP Short Term Goal 5 (Week 1): Pt will tolerate Dys 3/thin diet with min A cues for use of compensatory swallow strategies  Skilled Therapeutic Interventions: Skilled treatment session focused on cognitive goals. Upon arrival, patient was awake in bed but patient's gown was removed and his brief was wet. SLP facilitated session by providing extra time and hand over hand assist to initiate bed mobility. Patient's brief was changed and a new gown donned. Patient also repositioned to maximize arousal and attention. Patient declined breakfast despite Max encouragement and multiple attempts but agreeable to consuming coffee. Patient consumed without overt s/s of aspiration.  SLP also facilitated session by providing Mod verbal cues for orientation to place and time with external aids created to assist with recall. Patient initiated showing SLP a picture of his dog on his cell phone but required Mod verbal cues for navigation throughout task. Throughout session, patient distracted by discomfort requiring multiple repositions. Patient left upright in bed with alarm on and all needs within reach. Continue with current plan of care.      Pain Reports of pain "all over." Unable to rate   Therapy/Group: Individual  Therapy  Sathvik Tiedt 09/22/2021, 1:15 PM

## 2021-09-22 NOTE — Progress Notes (Signed)
Followed up on patient due to reports of hallucinations. At 4:30 pm, he was sitting up a chair, alert and answered basic orientation questions with occasional cues. Cognition at baseline and looked more alert/animated than on Friday at admission. Note that he has had worsening of renal status with hypernatremia--getting one Ensure per day. Will hold demadex 05/23 and resume on 05/24 daily. Continue to monitor daily weights--question accuracy of 57lbs wt gain from 05/20 to 05/21. Checked bed wt--131 kg. He does not look or sound like he is in overload. Daily BMET X 3 days.

## 2021-09-22 NOTE — Progress Notes (Signed)
Physical Therapy Session Note  Patient Details  Name: Ricky Hayden MRN: [id number] Date of Birth: August 18, 1945  Today's Date: 09/22/2021 PT Individual Time: 1400-1500 PT Individual Time Calculation (min): 60 min   Short Term Goals: Week 1:  PT Short Term Goal 1 (Week 1): Pt will increase rolling to mod A. PT Short Term Goal 2 (Week 1): Pt will increase supine to edge of bed to mod A. PT Short Term Goal 3 (Week 1): Pt will increase transfers bed to chair to mod A. PT Short Term Goal 4 (Week 1): Pt will increase ambulation with LRAD about 10 feet with mod A. PT Short Term Goal 5 (Week 1): Pt will ascend/descend 4 stairs with B rails and mod A.  Skilled Therapeutic Interventions/Progress Updates:    Pt presents in bed and lethargic and slow to arouse with turning on lights, touch, and verbal stimulation. Pt appears confused throughout session and overall slow to process. Pt mentions being awake last night because of "babies" but unable to elaborate when asked about sleep last night (RN reports he did not sleep well last night). Attempting to engage in bed mobility, removing covers and pt brings up there being "a big dog in the hallway". Again unable to clarify if he saw this earlier, as currently the door is closed. Pt also stating the things in his bed are not his, attempted to redirect as able but pt perseverating on this for several minutes. Noted pt to be soiled with urine, soaking through the sheets/pad. Mod to max assist for rolling to R and L (initial max to initiate but then progressing to mod assist) using rails for removal of brief, changing of pad, and donning new brief. Gave pt wash cloth to "clean privates" but pt initiates washing his belly but able to be redirected with extra time and hand over hand assist to initiate. Max assist to come to EOB initially with strong posterior lean but then able to progress to sitting with close supervision EOB with CGA for balance during dynamic activity  to don clean gown. Used Stedy for transfer to w/c with mod +2 and mod to max verbal cues for sequencing and initiation with facilitation for anterior weightshift. With propped sitting position, pt able to perform sit > stand with min assist and maintain standing with CGA. Once positioned in w/c requires significant amount of extra time for processing and again hand over hand assist for cues for hand placement but then patient able to scoot back in w/c with several attempts with breaks in between attempts. Engaged in wc mobility training with overall min assist and cues for technique with tendency to veer to the L - with practice able to correct with verbal cues x 50' with a turn. Left up in w/c for increased OOB tolerance and notified NT.   Therapy Documentation Precautions:  Precautions Precautions: Fall Precaution Comments: 2L O2 Restrictions Weight Bearing Restrictions: No  Pain: Does not report pain when asked.    Therapy/Group: Individual Therapy  Canary Brim Ivory Broad, PT, DPT, CBIS  09/22/2021, 8:29 AM

## 2021-09-23 DIAGNOSIS — N183 Chronic kidney disease, stage 3 unspecified: Secondary | ICD-10-CM

## 2021-09-23 DIAGNOSIS — R410 Disorientation, unspecified: Secondary | ICD-10-CM

## 2021-09-23 LAB — CBC
HCT: 31.5 % — ABNORMAL LOW (ref 39.0–52.0)
Hemoglobin: 10.3 g/dL — ABNORMAL LOW (ref 13.0–17.0)
MCH: 30.9 pg (ref 26.0–34.0)
MCHC: 32.7 g/dL (ref 30.0–36.0)
MCV: 94.6 fL (ref 80.0–100.0)
Platelets: 182 10*3/uL (ref 150–400)
RBC: 3.33 MIL/uL — ABNORMAL LOW (ref 4.22–5.81)
RDW: 13.7 % (ref 11.5–15.5)
WBC: 5.5 10*3/uL (ref 4.0–10.5)
nRBC: 0 % (ref 0.0–0.2)

## 2021-09-23 LAB — GLUCOSE, CAPILLARY
Glucose-Capillary: 92 mg/dL (ref 70–99)
Glucose-Capillary: 104 mg/dL — ABNORMAL HIGH (ref 70–99)
Glucose-Capillary: 111 mg/dL — ABNORMAL HIGH (ref 70–99)
Glucose-Capillary: 143 mg/dL — ABNORMAL HIGH (ref 70–99)

## 2021-09-23 LAB — BASIC METABOLIC PANEL
Anion gap: 5 (ref 5–15)
BUN: 39 mg/dL — ABNORMAL HIGH (ref 8–23)
CO2: 33 mmol/L — ABNORMAL HIGH (ref 22–32)
Calcium: 9.4 mg/dL (ref 8.9–10.3)
Chloride: 99 mmol/L (ref 98–111)
Creatinine, Ser: 1.63 mg/dL — ABNORMAL HIGH (ref 0.61–1.24)
GFR, Estimated: 43 mL/min — ABNORMAL LOW (ref >60)
Glucose, Bld: 107 mg/dL — ABNORMAL HIGH (ref 70–99)
Potassium: 4 mmol/L (ref 3.5–5.1)
Sodium: 137 mmol/L (ref 135–145)

## 2021-09-23 MED ORDER — TORSEMIDE 20 MG PO TABS: 20.0000 mg | ORAL_TABLET | Freq: Every day | ORAL | Status: DC

## 2021-09-23 NOTE — Progress Notes (Signed)
Physical Therapy Session Note  Patient Details  Name: Ricky Hayden MRN: [id number] Date of Birth: 03/02/1946  Today's Date: 09/23/2021 PT Individual Time: 1430-1526 PT Individual Time Calculation (min): 56 min   Short Term Goals: Week 1:  PT Short Term Goal 1 (Week 1): Pt will increase rolling to mod A. PT Short Term Goal 2 (Week 1): Pt will increase supine to edge of bed to mod A. PT Short Term Goal 3 (Week 1): Pt will increase transfers bed to chair to mod A. PT Short Term Goal 4 (Week 1): Pt will increase ambulation with LRAD about 10 feet with mod A. PT Short Term Goal 5 (Week 1): Pt will ascend/descend 4 stairs with B rails and mod A.  Skilled Therapeutic Interventions/Progress Updates:     Pt received supine in bed, awake and agreeable (with encouragement) to PT session. Pt reports generalized fatigue, that he could fall asleep at any moment. Pt requiring ++ time for initiation and task completion due to fatigue and deficits.   Supine<>sitting EOB requiring modA for trunk support, slow to engage. Requires minA for static sitting balance due to posterior lean. Requires MaxA for sit<>stand and stand<>pivot transfer using no AD from EOB to w/c, poor safety awareness and insight into deficits during transfer. Instructed in w/c propulsion - propelled himself ~87f with supervision using BUE - limited B shldr extension resulting in poor stroke efficiency. Worked on BLE strengthening using 1lb ankle weight, LAQ and hip marches 2x15 bilaterally.   Worked on general strengthening and endurance training using punch bag while sitting in w/c - 2x15 forward jabs + 2x15 side jabs. Rest breaks b/w sets.   Returned to his room in w/c for time management. [facility] +[facility]. Stedy used due to fatigue and limited initiation and engagement during transfers. Pt requiring maxA for sit>supine for trunk and BLE management. Remained supine in bed with all needs met, call bell in  reach.   Therapy Documentation Precautions:  Precautions Precautions: Fall Precaution Comments: 2L O2 Restrictions Weight Bearing Restrictions: No General:     Therapy/Group: Individual Therapy  CAlger Simons5/23/2023, 7:19 AM

## 2021-09-23 NOTE — Progress Notes (Signed)
Inpatient Rehabilitation Care Coordinator Assessment and Plan Patient Details  Name: Ricky Hayden MRN: [id number] Date of Birth: 04-19-1946  Today's Date: 09/23/2021  Hospital Problems: Principal Problem:   Debility  Past Medical History:  Past Medical History:  Diagnosis Date   Arthritis    osteoarthritis of left knee   Ascending aorta dilatation (HCC)    Balanitis    recurrent   Cardiac arrhythmia    life threatening, secondary to CCB vs b- blockers   Cardiomyopathy ([place])    Chronic joint pain    Chronic systolic CHF (congestive heart failure) (HCC)    CKD (chronic kidney disease), stage III (HCC)    COPD (chronic obstructive pulmonary disease) ([place])    Coronary artery disease    Dilated aortic root (HCC)    Enlarged prostate    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 Sherwood Gambler   Nocturia    Obesity    Pansinusitis    a.  complicated by brain abscess and bleeding requiring craniotomy in 1995.   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 [place] [facility];  Service: Cardiovascular;  Laterality: N/A;   shoulder surg rt   1995   SINUS SURGERY WITH INSTATRAK     ethmoidectomy and nasal septum repair   Social History:  reports that he quit smoking about 35 years ago. He has never used smokeless tobacco. He reports that he does not drink alcohol and does not use drugs.  Family / Support Systems Marital Status: Widow/Widower How Long?: 2021 Patient Roles: Parent Children: Amanda-daughter [phone]  Ashley-daughter [phone] Minda Meo [phone] Other Supports: Another son Anticipated Caregiver: All four children are involved-Ashely and [name] are main contacts. Hopeful can get mod/i during day since all work but will work on this Ability/Limitations of Caregiver: Children hopeful can reach at least supervision at this point does not have 24/7 care in place Caregiver Availability: Other (Comment) (Working on 24/7 care plan) Family Dynamics: Close with all four children who are very involved and will assist. All work but will make accommendations to assist and have since their Mom died two years ago  Social History Preferred language: English Religion: Baptist Cultural Background: No issues Education: [place] - How often do you need to have someone help you when you read instructions, pamphlets, or other written material from your doctor or pharmacy?: Never Writes:  Yes Employment Status: Retired Public relations account executive Issues: No issues Guardian/Conservator: None-according to MD pt is not fully capable of making his own decisions while here. WIll look toward his children for any decisions while here   Abuse/Neglect Abuse/Neglect Assessment Can Be Completed: Yes Physical Abuse: Denies Verbal Abuse: Denies Sexual Abuse: Denies Exploitation of patient/patient's resources: Denies Self-Neglect:  Denies  Patient response to: Social Isolation - How often do you feel lonely or isolated from those around you?: Never  Emotional Status Pt's affect, behavior and adjustment status: Pt is able to answer questions but still somewhat confused. He wants to go home from here and plans on going. His children are here daily and very supportive of him. He hopes to do well here Recent Psychosocial Issues: multiple ,medical issues and recent loss of wife Psychiatric History: No issues once more cognitively appropriate would benefit from seeing neuro-psych while here. Has had to deal with much in the past two years. Substance Abuse History: no issues  Patient / Family Perceptions, Expectations & Goals Pt/Family understanding of illness & functional limitations: Daughtr has a good understanding of his health issues and talks with the MD every other day and wants to be kept updated on any medical information. She updates her siblings and they all visit. Pt knows he is in the hospital and feels due to heart issues. Premorbid pt/family roles/activities: Father, grandfather, retiree, Anticipated changes in roles/activities/participation: resume Pt/family expectations/goals: Pt states: " I want to go home from here and not somewhere else."  [name] states: " We want him to go home but need to be able to manage him."  [employer]: Other (Comment) ([facility] [place]) Premorbid Home Care/DME Agencies: Other (Comment) (has hospital bed, home O2, rollator and tub seat) Transportation available at discharge: Pt drove PTA but children can assist with Is the patient able to respond to transportation needs?: Yes In the past 12 months, has lack of transportation kept you from medical appointments or from getting medications?: No In the past 12 months, has lack of transportation kept you from meetings, work, or from getting things needed for daily living?: No Resource referrals recommended:  Neuropsychology  Discharge Planning Living Arrangements: Alone Support Systems: Children, Friends/neighbors, Other relatives Type of Residence: Private residence Insurance Resources: Multimedia programmer (specify) ([facility]) Financial Resources: Social Security Financial Screen Referred: No Living Expenses: Own Money Management: Patient, Family Does the patient have any problems obtaining your medications?: No Home Management: Pt and children Patient/Family Preliminary Plans: Return home with children assisting currently they stay at night with him and on the weekends. They all work during the day and would need to come up with a plan for this if needed. [facility] but does not have aides at home. Care Coordinator Barriers to Discharge: Decreased caregiver support Care Coordinator Anticipated Follow Up Needs: HH/OP Expected length of stay: 14-16 Days  Clinical Impression Pleasant yet slightly confused patient who wants to do well here and return home. His four children are all involved and assist with his care. They currently do not have a caregiver during the day for him and are hopeful he does well here. One of hem stays at nights and on weekends and has for the past two years since mom passed away. Will work together on discharge needs.  Elease Hashimoto 09/23/2021, 11:14 AM

## 2021-09-23 NOTE — Progress Notes (Signed)
Occupational Therapy Session Note  Patient Details  Name: Ricky Hayden MRN: [id number] Date of Birth: [birth year]  Today's Date: 09/23/2021 OT Individual Time: 0830-0900 OT Individual Time Calculation (min): 30 min    Short Term Goals: Week 1:  OT Short Term Goal 1 (Week 1): Pt will sit EOB for >5 min to complete ADL with min A for balance. OT Short Term Goal 2 (Week 1): Pt will complete toilet transfer with max A and LRAD. OT Short Term Goal 3 (Week 1): Pt will complete grooming activity at sink with MIN a. OT Short Term Goal 4 (Week 1): Pt will don shirt with max A.  Skilled Therapeutic Interventions/Progress Updates:     Pt received in bed with unrated foot pain. RN alerted to deliver medicaiton.  ADL: Pt completes ADL at overall MIN A Level for grooming and UB dressing at bed level. Skilled interventions include: orientation conversation using external aides, skilled monitoring of O2 d/t pt being on RA satting 96%, set up for face washing and supervision for increasing time/cuing for oral care at bed level. 3 long sitting sit ups using bed rail for trunk controland OT to assist changing gown fastening in back.   Pt left at end of session in bed with exit alarm on, call light in reach and all needs met   Therapy Documentation Precautions:  Precautions Precautions: Fall Precaution Comments: 2L O2 Restrictions Weight Bearing Restrictions: No General:   Vital Signs: Therapy Vitals Temp: 97.9 F (36.6 C) Temp Source: Oral Pulse Rate: 63 Resp: 18 BP: 107/71 Patient Position (if appropriate): Lying Oxygen Therapy SpO2: 100 % O2 Device: Room Air;Nasal Cannula O2 Flow Rate (L/min): 3 L/min Pain: Pain Assessment Pain Scale: 0-10 Pain Score: 0-No pain ADL: ADL Eating: Moderate assistance Where Assessed-Eating: Bed level Grooming: Minimal assistance Where Assessed-Grooming: Edge of bed Upper Body Bathing: Maximal assistance Where Assessed-Upper Body Bathing: Edge of  bed Lower Body Bathing: Dependent Where Assessed-Lower Body Bathing: Bed level Upper Body Dressing: Dependent Where Assessed-Upper Body Dressing: Bed level Lower Body Dressing: Dependent Where Assessed-Lower Body Dressing: Bed level Toileting: Unable to assess Toilet Transfer: Unable to assess Tub/Shower Transfer: Not assessed Social research officer, government: Not assessed Vision   Perception    Praxis   Balance   Exercises:   Other Treatments:     Therapy/Group: Individual Therapy  Tonny Branch 09/23/2021, 6:53 AM

## 2021-09-23 NOTE — Progress Notes (Signed)
Patient ID: Maury Merriweather, male   DOB: 10/31/1945, 75 y.o.   MRN: [id number] Met with the patient to review rehab process, team conference and plan of care. Reviewed current situation; some confusion noted yesterday with lethargy. More alert today, joking and following conversation. Discussed medications with anticipation of insulin wean. Reviewed secondary risk management including HF, A-fib, HTN  and COPD. Patient noted he was following HH diet at home PTA; with daily weights. Only using oxygen at HS when sleeping PTA; O2 removed during the day, recheck sats off O2 with elevated CO2 level noted. Appetite better this AM. Continue to follow along to discharge to address educational needs to facilitate preparation for discharge. Sharp, Deborah B  

## 2021-09-23 NOTE — Progress Notes (Signed)
Speech Language Pathology Daily Session Note  Patient Details  Name: Ricky Hayden MRN: [id number] Date of Birth: Feb 09, 1946  Today's Date: 09/23/2021 SLP Individual Time: 1131-1201 SLP Individual Time Calculation (min): 30 min  Short Term Goals: Week 1: SLP Short Term Goal 1 (Week 1): Pt will attend to functional tasks for 8-10 minutes with mod A cues for support SLP Short Term Goal 2 (Week 1): Pt will recall functional information with mod A cues for compensatory strategies SLP Short Term Goal 3 (Week 1): Pt will increase orientation x4 with mod A cues for use of external aids SLP Short Term Goal 4 (Week 1): Pt will solve basic problems during functional tasks with mod A cues SLP Short Term Goal 5 (Week 1): Pt will tolerate Dys 3/thin diet with min A cues for use of compensatory swallow strategies  Skilled Therapeutic Interventions: Pt seen for skilled ST with focus on cognitive and swallowing goals, pt upright in wheelchair and agreeable to therapeutic tasks. SLP facilitating orientation task by providing min A cues to scan environment to increase awareness of time and place concepts. Pt agreeable to regular texture snack trial, consuming graham cracker with mod A cues to utilize liquid wash as needed to clear oral stasis. Pt demonstrating some extended mastication and stating "this is dry" but would not drink water without verbal cues. Pt with no overt s/s aspiration or change in vocal quality across trials. Pt left in wheelchair with alarm belt activated and all needs within reach. Cont ST POC.   Pain Pain Assessment Pain Scale: 0-10 Pain Score: 0-No pain Pain Type: Acute pain Pain Location: Leg Pain Orientation: Right;Left Pain Descriptors / Indicators: Aching Pain Frequency: Constant Pain Onset: On-going Pain Intervention(s): Medication (See eMAR)  Therapy/Group: Individual Therapy  [name] 09/23/2021, 11:53 AM

## 2021-09-23 NOTE — Progress Notes (Signed)
Occupational Therapy Session Note  Patient Details  Name: Ricky Hayden MRN: [id number] Date of Birth: 10-29-1945  Today's Date: 09/23/2021 OT Individual Time: 0945-1100 OT Individual Time Calculation (min): 75 min    Short Term Goals: Week 1:  OT Short Term Goal 1 (Week 1): Pt will sit EOB for >5 min to complete ADL with min A for balance. OT Short Term Goal 2 (Week 1): Pt will complete toilet transfer with max A and LRAD. OT Short Term Goal 3 (Week 1): Pt will complete grooming activity at sink with MIN a. OT Short Term Goal 4 (Week 1): Pt will don shirt with max A.  Skilled Therapeutic Interventions/Progress Updates:    Pt received in bed more alert today. 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  Asked pt to sit up to EOB. Pt demonstrated much improved initiation with rolling to his R and pushing to sit up with SUPERVISION!  This is dramatic improvement.  Pt then sat at EOB to donn a paper scrub shirt with min cues.  Donned pants over feet with mod A.   With +2 for safety, pt stood from EOB with mod A of 1 person then placed hands on RW. Pt stood and helped to pull pants over hips with mod A.  Pt then took 4-5 small steps to turn to sit in wc with +2 for safety but only min -mod A.  Pt taken to gym to work on ambulation for his endurance, initiation and attention.  Pt was NOT excited about trying to walk and needed a great deal of encouragement. But he did walk 15 feet 2x with +2 on each side for safety and a close w/c follow. Pt ambulated with min A.   Pt did well and demonstrated good progress.  Pt taken back to room and resting in wc with all needs met. Belt alarm on.   Therapy Documentation Precautions:  Precautions Precautions: Fall Precaution Comments: 2L O2 Restrictions Weight Bearing Restrictions: No  Vital Signs: Therapy Vitals Temp: 97.9 F (36.6 C) Temp Source: Oral Pulse Rate:  63 Resp: 18 BP: 107/71 Patient Position (if appropriate): Lying Oxygen Therapy SpO2: 95 % O2 Device: Room Air O2 Flow Rate (L/min): 3 L/min Pain: Pain Assessment Pain Scale: 0-10 Pain Score: 0-No pain    Therapy/Group: Individual Therapy  [place] 09/23/2021, 8:57 AM

## 2021-09-23 NOTE — Plan of Care (Signed)
  Problem: RH BOWEL ELIMINATION Goal: RH STG MANAGE BOWEL WITH ASSISTANCE Description: STG Manage Bowel with Assistance. Mod I  Outcome: Not Progressing; LBM 5/19; laxative given

## 2021-09-23 NOTE — Progress Notes (Signed)
PROGRESS NOTE   Subjective/Complaints: No new complaints or concerns this AM. Reports he feels well overall.  Had some confusion/hallucinations reported yesterday during the day that resolved.   ROS: no CP or SOB, fevers or chills   Objective:   No results found. Recent Labs    09/23/21 0516  WBC 5.5  HGB 10.3*  HCT 31.5*  PLT 182    Recent Labs    09/23/21 0516  NA 137  K 4.0  CL 99  CO2 33*  GLUCOSE 107*  BUN 39*  CREATININE 1.63*  CALCIUM 9.4     Intake/Output Summary (Last 24 hours) at 09/23/2021 0748 Last data filed at 09/23/2021 0640 Gross per 24 hour  Intake 687 ml  Output 950 ml  Net -263 ml      Pressure Injury 09/09/21 Buttocks Medial;Bilateral Deep Tissue Pressure Injury - Purple or maroon localized area of discolored intact skin or blood-filled blister due to damage of underlying soft tissue from pressure and/or shear. purple/red with petechi (Active)  09/09/21 2100  Location: Buttocks  Location Orientation: Medial;Bilateral  Staging: Deep Tissue Pressure Injury - Purple or maroon localized area of discolored intact skin or blood-filled blister due to damage of underlying soft tissue from pressure and/or shear.  Wound Description (Comments): purple/red with petechiae  Present on Admission: No     Pressure Injury 09/17/21 Anus Medial Stage 2 -  Partial thickness loss of dermis presenting as a shallow open injury with a red, pink wound bed without slough. (Active)  09/17/21 1115  Location: Anus  Location Orientation: Medial  Staging: Stage 2 -  Partial thickness loss of dermis presenting as a shallow open injury with a red, pink wound bed without slough.  Wound Description (Comments):   Present on Admission:     Physical Exam: Vital Signs Blood pressure 107/71, pulse 63, temperature 97.9 F (36.6 C), temperature source Oral, resp. rate 18, height 5\' 7"  (1.702 m), weight 134 kg, SpO2 100  %.  Vitals and nursing note reviewed. BMI 35.56 HENT:     Head: Well healed old crani incision.  Pulmonary:     Comments: CTAB, normal effort, Room air Cardiac: RRR Abdominal:     General: There is distension.     Tenderness: There is no abdominal tenderness.  + BS Neurological:     Mental Status: Alert and oriented to self, place, month, follows commands MSK: protracted posture. No focal deficits.  Assessment/Plan: 1. Functional deficits which require 3+ hours per day of interdisciplinary therapy in a comprehensive inpatient rehab setting. Physiatrist is providing close team supervision and 24 hour management of active medical problems listed below. Physiatrist and rehab team continue to assess barriers to discharge/monitor patient progress toward functional and medical goals  Care Tool:  Bathing        Body parts bathed by helper: Face     Bathing assist Assist Level: Minimal Assistance - Patient > 75%     Upper Body Dressing/Undressing Upper body dressing   What is the patient wearing?: Hospital gown only    Upper body assist Assist Level: Moderate Assistance - Patient 50 - 74%    Lower Body Dressing/Undressing Lower body  dressing      What is the patient wearing?: Incontinence brief     Lower body assist Assist for lower body dressing: Maximal Assistance - Patient 25 - 49%     Toileting Toileting Toileting Activity did not occur (Clothing management and hygiene only): N/A (no void or bm)  Toileting assist Assist for toileting: Total Assistance - Patient < 25%     Transfers Chair/bed transfer  Transfers assist  Chair/bed transfer activity did not occur: Safety/medical concerns  Chair/bed transfer assist level: Dependent - mechanical lift (mod+2 with stedy)     Locomotion Ambulation   Ambulation assist      Assist level: Maximal Assistance - Patient 25 - 49% Assistive device: No Device Max distance: 2   Walk 10 feet activity   Assist   Walk 10 feet activity did not occur: Safety/medical concerns        Walk 50 feet activity   Assist Walk 50 feet with 2 turns activity did not occur: Safety/medical concerns         Walk 150 feet activity   Assist Walk 150 feet activity did not occur: Safety/medical concerns         Walk 10 feet on uneven surface  activity   Assist Walk 10 feet on uneven surfaces activity did not occur: Safety/medical concerns         Wheelchair     Assist Is the patient using a wheelchair?: Yes Type of Wheelchair: Manual    Wheelchair assist level: Minimal Assistance - Patient > 75% Max wheelchair distance: 60'    Wheelchair 50 feet with 2 turns activity    Assist        Assist Level: Minimal Assistance - Patient > 75%   Wheelchair 150 feet activity     Assist      Assist Level: Dependent - Patient 0%   Blood pressure 107/71, pulse 63, temperature 97.9 F (36.6 C), temperature source Oral, resp. rate 18, height 5\' 7"  (1.702 m), weight 134 kg, SpO2 100 %.  Medical Problem List and Plan: 1. Functional deficits secondary to debility from septic shock.             -patient may shower             -ELOS/Goals: S 10-14 days             Continue CIR PT/OT/SLP  -ADLS at min A for grooming and UB dressing with OT 2.  Antithrombotics: -DVT/anticoagulation:  Pharmaceutical: Other (comment) Eliquis             -antiplatelet therapy: N/A 3. Low back pain: kpad ordered. Continue Ultram or tylenol prn.  4. Mood: LCSW to follow for evaluation and support.              -antipsychotic agents: N/A 5. Neuropsych: This patient is not fully capable of making decisions on his own behalf. 6. Skin/Wound Care:  Air mattress overlay for DTI             --will add juven/vitamins to promote wound healing.  7. Fluids/Electrolytes/Nutrition: Strict I/O. Juven and Ensure max             --Recheck CMET in am 8. Chronic systolic/diastolic CHF: Monitor for signs of overload. Check  daily weights.              --Back on Demadex, Entresto, Coreg and Pravacol. Resume Jardiance.              --add  Low salt/HH restrictions.  9. SSS/A fib s/p PPM/ICD: Monitor HR TID. Monitor for symptoms with increase in activity.  --HR controlled on Eliquis, coreg and amiodarone.   10. CKD: Baseline SCr-1.5 and renal status trending back to baseline with resumption of diuretics.              --continue to monitor with serial checks.   -5/23 Demadex held this am, Cr a little above baseline, follow BMP tomorrow 11. Pre-diabetes/Hyperglycemia: Hgb A1C- 5.7. Jardiance on hold due to infection.  --Elevated BS likely due to steroids/illness.  --Will change Ensure to Ensure Max/Add CM restrictions             --Resume Jardiance in am and wean off insulin glargine.   -5/22 Decrease semglee to 8units  -5/23 CGB well controlled, consider semglee decrease tomorrow 12. COPD: Respiratory status stable--to resume Dulera.  --Wean oxgyen as tolerated. Has been oxygen dependent at nights.              --encourage pulmonary hygiene.  13. BPH/Nephrolithiasis: Urinary retention at admission. Continue Flomax and Proscar.              --follow up with Dr. Thalia Bloodgood after discharge.  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  Morbid Obesity: Educate on wt loss/appropriate diet to promote overall health and mobility.  91. Screening for vitamin D deficiency: Vitamin D 54, 1,000U daily supplement started 17. History of hypermagnesemia: repeated 5/20 and is stable at 2.1 18. Hypertension: increase flomax to 0,8mg  and change to after supper. Improved, decrease Coreg to 3.125mg  BID.  -5/23 BP well controlled overall, monitor for orthostatic hypotension 19. Hypoalbuminemia: continue Juven.   -Continue juven and ensure max 20. Episode of confusion/hallucination  -Mental status appears to be at baseline  currently  -Repeat labs this AM with normal WBC and Cr and BUN slightly above baseline, demadex held this am, and hold tomorrow until BMP complete   LOS: 4 days A FACE TO FACE EVALUATION WAS PERFORMED  Jennye Boroughs 09/23/2021, 7:48 AM

## 2021-09-24 ENCOUNTER — Telehealth: Payer: Self-pay | Admitting: *Deleted

## 2021-09-24 DIAGNOSIS — I428 Other cardiomyopathies: Secondary | ICD-10-CM

## 2021-09-24 DIAGNOSIS — R000 Tachycardia, unspecified: Secondary | ICD-10-CM

## 2021-09-24 DIAGNOSIS — I5022 Chronic systolic (congestive) heart failure: Secondary | ICD-10-CM

## 2021-09-24 LAB — GLUCOSE, CAPILLARY
Glucose-Capillary: 116 mg/dL — ABNORMAL HIGH (ref 70–99)
Glucose-Capillary: 108 mg/dL — ABNORMAL HIGH (ref 70–99)
Glucose-Capillary: 90 mg/dL (ref 70–99)
Glucose-Capillary: 131 mg/dL — ABNORMAL HIGH (ref 70–99)

## 2021-09-24 LAB — BASIC METABOLIC PANEL
Anion gap: 7 (ref 5–15)
BUN: 49 mg/dL — ABNORMAL HIGH (ref 8–23)
CO2: 29 mmol/L (ref 22–32)
Calcium: 9.5 mg/dL (ref 8.9–10.3)
Chloride: 102 mmol/L (ref 98–111)
Creatinine, Ser: 1.71 mg/dL — ABNORMAL HIGH (ref 0.61–1.24)
GFR, Estimated: 41 mL/min — ABNORMAL LOW (ref >60)
Glucose, Bld: 120 mg/dL — ABNORMAL HIGH (ref 70–99)
Potassium: 3.9 mmol/L (ref 3.5–5.1)
Sodium: 138 mmol/L (ref 135–145)

## 2021-09-24 MED ORDER — SODIUM CHLORIDE 0.9 % IV BOLUS
500.0000 mL | Freq: Once | INTRAVENOUS | Status: AC
  Administered 2021-09-24: 500 mL via INTRAVENOUS

## 2021-09-24 MED ORDER — TORSEMIDE 20 MG PO TABS: 20.0000 mg | ORAL_TABLET | Freq: Every day | ORAL | Status: DC

## 2021-09-24 MED ORDER — POLYETHYLENE GLYCOL 3350 17 G PO PACK
17.0000 g | PACK | Freq: Every day | ORAL | Status: DC
  Administered 2021-09-24 – 2021-09-25 (×2): 17 g via ORAL
  Filled 2021-09-24: qty 1

## 2021-09-24 MED ORDER — AMIODARONE HCL 200 MG PO TABS
400.0000 mg | ORAL_TABLET | Freq: Two times a day (BID) | ORAL | Status: DC
  Administered 2021-09-24 – 2021-09-25 (×2): 400 mg via ORAL
  Filled 2021-09-24 (×2): qty 2

## 2021-09-24 MED ORDER — SODIUM CHLORIDE 0.9 % IV BOLUS
250.0000 mL | Freq: Once | INTRAVENOUS | Status: AC | PRN
  Administered 2021-09-24 (×2): 250 mL via INTRAVENOUS

## 2021-09-24 MED ORDER — SODIUM CHLORIDE 0.9 % IV BOLUS
250.0000 mL | Freq: Once | INTRAVENOUS | Status: AC
Start: 2021-09-24 — End: 2021-09-24
  Administered 2021-09-24: 250 mL via INTRAVENOUS

## 2021-09-24 MED ORDER — INSULIN GLARGINE-YFGN 100 UNIT/ML ~~LOC~~ SOLN
6.0000 [IU] | Freq: Every day | SUBCUTANEOUS | Status: DC
  Administered 2021-09-24 – 2021-09-25 (×2): 6 [IU] via SUBCUTANEOUS
  Filled 2021-09-24 (×3): qty 0.06

## 2021-09-24 NOTE — Progress Notes (Addendum)
PROGRESS NOTE   Subjective/Complaints: Rapid response yesterday for low BP and elevated pulse. He received 1L IV fluids with BP improvement.  Pt reports he feels tired from waking up early.  Updated pts daughter over his overall condition. She reports his mentation seemed much better today when she spoke with him.  ROS: no CP or SOB, fevers or chills. No HA   Objective:   No results found. Recent Labs    09/23/21 0516  WBC 5.5  HGB 10.3*  HCT 31.5*  PLT 182    Recent Labs    09/23/21 0516 09/24/21 0548  NA 137 138  K 4.0 3.9  CL 99 102  CO2 33* 29  GLUCOSE 107* 120*  BUN 39* 49*  CREATININE 1.63* 1.71*  CALCIUM 9.4 9.5     Intake/Output Summary (Last 24 hours) at 09/24/2021 1022 Last data filed at 09/24/2021 0756 Gross per 24 hour  Intake 577 ml  Output 300 ml  Net 277 ml     Pressure Injury 09/09/21 Buttocks Medial;Bilateral Deep Tissue Pressure Injury - Purple or maroon localized area of discolored intact skin or blood-filled blister due to damage of underlying soft tissue from pressure and/or shear. purple/red with petechi (Active)  09/09/21 2100  Location: Buttocks  Location Orientation: Medial;Bilateral  Staging: Deep Tissue Pressure Injury - Purple or maroon localized area of discolored intact skin or blood-filled blister due to damage of underlying soft tissue from pressure and/or shear.  Wound Description (Comments): purple/red with petechiae  Present on Admission: No     Pressure Injury 09/17/21 Anus Medial Stage 2 -  Partial thickness loss of dermis presenting as a shallow open injury with a red, pink wound bed without slough. (Active)  09/17/21 1115  Location: Anus  Location Orientation: Medial  Staging: Stage 2 -  Partial thickness loss of dermis presenting as a shallow open injury with a red, pink wound bed without slough.  Wound Description (Comments):   Present on Admission:     Physical  Exam: Vital Signs Blood pressure 90/71, pulse (!) 130, temperature 99.4 F (37.4 C), temperature source Oral, resp. rate 18, height 5\' 7"  (1.702 m), weight 134 kg, SpO2 100 %.  Vitals and nursing note reviewed. BMI 35.56 HENT:     Head: Well healed old crani incision.  Pulmonary:     Comments: CTAB, normal effort, Room air Cardiac: Tachycardic Abdominal:     General: There is distension.     Tenderness: There is no abdominal tenderness.  + BS Neurological:     Mental Status: Alert and oriented to self, place, month, follows commands MSK: protracted posture. No focal deficits.  Assessment/Plan: 1. Functional deficits which require 3+ hours per day of interdisciplinary therapy in a comprehensive inpatient rehab setting. Physiatrist is providing close team supervision and 24 hour management of active medical problems listed below. Physiatrist and rehab team continue to assess barriers to discharge/monitor patient progress toward functional and medical goals  Care Tool:  Bathing        Body parts bathed by helper: Face     Bathing assist Assist Level: Minimal Assistance - Patient > 75%     Upper Body Dressing/Undressing  Upper body dressing   What is the patient wearing?: Hospital gown only    Upper body assist Assist Level: Moderate Assistance - Patient 50 - 74%    Lower Body Dressing/Undressing Lower body dressing      What is the patient wearing?: Incontinence brief     Lower body assist Assist for lower body dressing: Maximal Assistance - Patient 25 - 49%     Toileting Toileting Toileting Activity did not occur (Clothing management and hygiene only): N/A (no void or bm)  Toileting assist Assist for toileting: Total Assistance - Patient < 25%     Transfers Chair/bed transfer  Transfers assist  Chair/bed transfer activity did not occur: Safety/medical concerns  Chair/bed transfer assist level: Dependent - mechanical lift (mod+2 with stedy)      Locomotion Ambulation   Ambulation assist      Assist level: Maximal Assistance - Patient 25 - 49% Assistive device: No Device Max distance: 2   Walk 10 feet activity   Assist  Walk 10 feet activity did not occur: Safety/medical concerns        Walk 50 feet activity   Assist Walk 50 feet with 2 turns activity did not occur: Safety/medical concerns         Walk 150 feet activity   Assist Walk 150 feet activity did not occur: Safety/medical concerns         Walk 10 feet on uneven surface  activity   Assist Walk 10 feet on uneven surfaces activity did not occur: Safety/medical concerns         Wheelchair     Assist Is the patient using a wheelchair?: Yes Type of Wheelchair: Manual    Wheelchair assist level: Minimal Assistance - Patient > 75% Max wheelchair distance: 16'    Wheelchair 50 feet with 2 turns activity    Assist        Assist Level: Minimal Assistance - Patient > 75%   Wheelchair 150 feet activity     Assist      Assist Level: Dependent - Patient 0%   Blood pressure 90/71, pulse (!) 130, temperature 99.4 F (37.4 C), temperature source Oral, resp. rate 18, height 5\' 7"  (1.702 m), weight 134 kg, SpO2 100 %.  Medical Problem List and Plan: 1. Functional deficits secondary to debility from septic shock.             -patient may shower             -ELOS/Goals: S 10-14 days             Continue CIR PT/OT/SLP  -Team conference today  -ADLS at min A for grooming and UB dressing with OT 2.  Antithrombotics: -DVT/anticoagulation:  Pharmaceutical: Other (comment) Eliquis             -antiplatelet therapy: N/A 3. Low back pain: kpad ordered. Continue Ultram or tylenol prn.  4. Mood: LCSW to follow for evaluation and support.              -antipsychotic agents: N/A 5. Neuropsych: This patient is not fully capable of making decisions on his own behalf. 6. Skin/Wound Care:  Air mattress overlay for DTI              --will add juven/vitamins to promote wound healing.  7. Fluids/Electrolytes/Nutrition: Strict I/O. Juven and Ensure max             --Recheck CMET in am 8. Chronic systolic/diastolic CHF: Monitor for signs  of overload. Check daily weights.              --Back on Demadex, Entresto, Coreg and Pravacol. Resume Jardiance.              --add Low salt/HH restrictions.  9. SSS/A fib s/p PPM/ICD: Monitor HR TID. Monitor for symptoms with increase in activity.  --HR controlled on Eliquis, coreg and amiodarone.   10. CKD: Baseline SCr-1.5 and renal status trending back to baseline with resumption of diuretics.              --continue to monitor with serial checks.   -5/23 Demadex held this am, Cr a little above baseline, follow BMP tomorrow  -5/24 Pt got 1 L fluids, hold Demadex again, recheck lab tomorrow 11. Pre-diabetes/Hyperglycemia: Hgb A1C- 5.7. Jardiance on hold due to infection.  --Elevated BS likely due to steroids/illness.  --Will change Ensure to Ensure Max/Add CM restrictions             --Resume Jardiance in am and wean off insulin glargine.   -5/22 Decrease semglee to 8units  -5/24 Decrease semglee to 6 units 12. COPD: Respiratory status stable--to resume Dulera.  --Wean oxgyen as tolerated. Has been oxygen dependent at nights.              --encourage pulmonary hygiene.  13. BPH/Nephrolithiasis: Urinary retention at admission. Continue Flomax and Proscar.              --follow up with Dr. Thalia Bloodgood after discharge.  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  Morbid Obesity: Educate on wt loss/appropriate diet to promote overall health and mobility.  75. Screening for vitamin D deficiency: Vitamin D 54, 1,000U daily supplement started 17. History of hypermagnesemia: repeated 5/20 and is stable at 2.1 18. Hypertension: increase flomax to 0,8mg  and change to after supper. Improved,  decrease Coreg to 3.125mg  BID.  -5/23 BP well controlled overall, monitor for orthostatic hypotension  -5/24 Pt developed tachycardia and hypotension, rapid response, he was given 1 L fluids with improvement 19. Hypoalbuminemia: continue Juven.   -Continue juven and ensure max 20. Episode of confusion/hallucination  -Mental status appears to be at baseline currently  -5/23 Repeat labs this AM with normal WBC and Cr and BUN slightly above baseline, demadex held this am, and hold tomorrow until BMP complete  -5/24 mental status appears to be at baselines 21. Tachycardia  -5/24 Rapid response was called this AM for hypotension and tachycardia. EKG with Sinus Tachycardia. Improved after 1 L fluids. Cardiology consult was placed.  Continue gentle fluid repletion to avoid fluid overload.  Strict I/O ordered.       LOS: 5 days A FACE TO FACE EVALUATION WAS PERFORMED  Jennye Boroughs 09/24/2021, 7:40 AM

## 2021-09-24 NOTE — Telephone Encounter (Signed)
Mr Dirr daughter is calling for an update on her father's condition (he is inpt rehab). Please give her a call.  Katie, Moch Daughter     [phone]

## 2021-09-24 NOTE — Progress Notes (Signed)
Occupational Therapy Session Note  Patient Details  Name: Ricky Hayden MRN: [id number] Date of Birth: December 17, 1945  Today's Date: 09/24/2021 OT Missed Time: 54 Minutes Missed Time Reason: Patient ill (comment);MD hold (comment)  Pt received in bed. Vitals assessed HR 135 at rest with dynamap- ? Afib implications with accuracy of reading (RN reports checking prior at 110s). BP 90/66. Held tx d/t elevated HR and low BP. Pt also reporting fatigue and not wanting to do bedside tx. Will follow up per POC.  [name] 09/24/2021, 6:57 AM

## 2021-09-24 NOTE — Progress Notes (Signed)
Patient ID: Ricky Hayden, male   DOB: [birth year], 76 y.o.   MRN: [id number]  Spoke with daughter Ashley Mariner telephone to give him an update on team conference goals of min assist and target discharge date 6/14. She is hopeful once he feels better and can do more he will do better than stated goals. She is aware of his fluctuation he is having and hopes once can get medical more stable then his movement will get better Daughter feels his mental capabilities is better he will do better physically and feels he has improved since admission here.   2:25 PM Met with pt to give him an update on the team conference goals and target discharge date. He felt that was too long discussed if reached goals sooner could move up date. Aware team needs to get him up and moving and make sure he is medically stable while doing this. Goal is to get him home and he reports this is what he wants also.  Will work on discharge needs.

## 2021-09-24 NOTE — Progress Notes (Signed)
Physical Therapy Session Note  Patient Details  Name: Ricky Hayden MRN: [id number] Date of Birth: June 07, 1945  Today's Date: 09/24/2021 PT Missed Time: 45 Minutes Missed Time Reason: MD hold (Comment)  Per Dr. Marciano Sequin, pt still on medical hold at this time due to pt's recent elevated HR and hypotension. Plan for therapy team to follow-up with Dr. Marciano Sequin in AM prior to treating patient to see if pt is appropriate to participate at that time.  Tawana Scale , PT, DPT, NCS, CSRS 09/24/2021, 12:19 PM

## 2021-09-24 NOTE — Significant Event (Addendum)
Rapid Response Event Note   Reason for Call :  Hypotension 79/57 Initial Focused Assessment:  Nursing staff notified me of pt with low BP 79/57 (66). Pt is alert, able to answer questions and denies CP/SOB. BBS Clear. No distress. Skin warm pink and dry with cap refill wnl. NS bolus initiated. Pulse tachycardic but feels regular. Consider EKG for afib. Pt has pacer/ICD. CBG 116.   0526-HR 131, 77/62 (69), RR 18 with sats 100% on RA. Additional NS bolus given.   1224-82.5 F, HR 130, 87/67 (74), RR 18 with sats 100% on RA  0640-HR 130, 90/71, RR 16 with sats 100%.   Interventions:  -NS bolus for a total of 1 L (250, 250, 500)  Plan of Care:  -Notify primary MD for further orders -Notify RRRN for further assistance if needed.      MD Notified: Marlowe Shores PA ([id number]) Call Time: [place] Time: 0448 End Time: 0037  Madelynn Done, RN

## 2021-09-24 NOTE — Plan of Care (Signed)
  Problem: RH BOWEL ELIMINATION Goal: RH STG MANAGE BOWEL WITH ASSISTANCE Description: STG Manage Bowel with Assistance. Mod I  Outcome: Not Progressing; constipation ; laxatives given yestesday and today   Problem: RH BLADDER ELIMINATION Goal: RH STG MANAGE BLADDER WITH ASSISTANCE Description: STG Manage Bladder With Assistance Mod I assist Outcome: Not Progressing; incontinence

## 2021-09-24 NOTE — Patient Care Conference (Signed)
Inpatient RehabilitationTeam Conference and Plan of Care Update Date: 09/24/2021   Time: 12:10 PM    Patient Name: Ricky Hayden      Medical Record Number: [id number]  Date of Birth: [birth year] Sex: Male         Room/Bed: 4M12C/4M12C-01 Payor Info: Payor: [place] / Plan: [place] / Product Type: *No Product type* /    Admit Date/Time:  09/19/2021  3:10 PM  Primary Diagnosis:  [facility] Problems: Principal Problem:   Debility    Expected Discharge Date:    Team Members Present: Physician leading conference: Dr. Jennye Boroughs Social Worker Present: Ovidio Kin, LCSW Nurse Present: Dorien Chihuahua, RN PT Present: Becky Sax, PT OT Present: Meriel Pica, OT SLP Present: Weston Anna, SLP PPS Coordinator present : Gunnar Fusi, SLP     Current Status/Progress Goal Weekly Team Focus  Bowel/Bladder   bladder continent, requests assistance with male urinal as needed; mixed bowel in  maintain level of continence  routine offer, & toileting as needed, q4hrs   Swallow/Nutrition/ Hydration   Dys. 3 textures and thin liquids, Full supervision  Supervision  tolerance of current diet, encouragement to maximize PO intake   ADL's   Supervision - min bed mobility, mod sit to stand and st pivot with RW, cues with UB self care, mod- max LB self care, delayed initiation  min A toilet transfers, bathing;, mod A LB dressing and toileting  ADL training, functional mobility, strength, cognition   Mobility   maxA bed mobility, maxA stand<>pivot transfers. Very delayed processing and initiation. More than adequate time to complete the simplest mobility tasks. ?self limitating behaviors. Would benefit from being 15/7 frequency  minA overall  Functional mobility, endurance and strengthening, gait as able.   Communication             Safety/Cognition/ Behavioral Observations  Max A for basic tasks  Min A  orientation with use of aids, sustained  attention, functional problem solving, initiation   Pain   no c/o pain when asked  remain without pain/discomfort  assess for pain/discomfort routinely QS. & PRN, medicated as needed, document, follow up for intervention effectiveness.   Skin     DTI left buttock Stage 2 anus   Skin healing   Monitor skin q shift Air mattress bed, skin care    Discharge Planning:  Plan is home as long as children can manage him-aware he will need 24/7 care at [facility] and trying to figure out a plan.   Team Discussion: Patient with bradycardia and hypotension and cognitive changes. MD adjusted medications and working on insulin weaning. Cardiology consulted for vital sign changes; given history. Given IV bolus. Continue to note poor po intake and patient is hesitant to drink fluids due to hx of HF. Progress inhibited by self limiting behaviors and delayed processing. Discussion of change to 15/7 therapy; continue normal therapy session for now.  Patient on target to meet rehab goals: No, currently max assist of 2 to total assist overall. Able to ambulate up to 15'.   *See Care Plan and progress notes for long and short-term goals.   Revisions to Treatment Plan:  Palliative consult Cardio consult   Teaching Needs: Safety, medication, dietary modifications, transfers, toileting, etc  Current Barriers to Discharge: Decreased caregiver support, Home enviroment access/layout, and Incontinence  Possible Resolutions to Barriers: Family education; 24/7 care recommended FMLA reviewed with daughter     Medical Summary Current Status: Debility due to septic shock,  CHF, CKD, Afib, prediabetes, COPD, htn, confusion, poor appetite  Barriers to Discharge: Behavior;Medical stability;Decreased family/caregiver support;Home enviroment access/layout;Nutrition means  Barriers to Discharge Comments: Debility due to septic shock, CHF, CKD, Afib, prediabetes, COPD, htn, confusion, poor appetite Possible Resolutions to  [employer] Focus: monitor fluids, Cardiology consult, follow labs, wean O2, adjust insulin   Continued Need for Acute Rehabilitation Level of Care: The patient requires daily medical management by a physician with specialized training in physical medicine and rehabilitation for the following reasons: Direction of a multidisciplinary physical rehabilitation program to maximize functional independence : Yes Medical management of patient stability for increased activity during participation in an intensive rehabilitation regime.: Yes Analysis of laboratory values and/or radiology reports with any subsequent need for medication adjustment and/or medical intervention. : Yes   I attest that I was present, lead the team conference, and concur with the assessment and plan of the team.   Dorien Chihuahua B 09/24/2021, 4:36 PM

## 2021-09-24 NOTE — Progress Notes (Signed)
Speech Language Pathology Daily Session Note  Patient Details  Name: Ricky Hayden MRN: [id number] Date of Birth: 1945-10-10  Today's Date: 09/24/2021 SLP Individual Time: 1000-1055 SLP Individual Time Calculation (min): 55 min  Short Term Goals: Week 1: SLP Short Term Goal 1 (Week 1): Pt will attend to functional tasks for 8-10 minutes with mod A cues for support SLP Short Term Goal 2 (Week 1): Pt will recall functional information with mod A cues for compensatory strategies SLP Short Term Goal 3 (Week 1): Pt will increase orientation x4 with mod A cues for use of external aids SLP Short Term Goal 4 (Week 1): Pt will solve basic problems during functional tasks with mod A cues SLP Short Term Goal 5 (Week 1): Pt will tolerate Dys 3/thin diet with min A cues for use of compensatory swallow strategies  Skilled Therapeutic Interventions:Skilled ST services focused on swallow and cognitive skills. Pt was orientated to place, day of week and month, as well as situation given choices. Pt consumed thin liquids via straw with consecutive sips, no overt s/s aspiration and vocal quality appeared clear. SLP facilitated sustained attention, basic problem solving and recall in sorting and novel card task. Pt demonstrated ability to sort cards by 5 colors and then shapes with supervision A verbal cues fade to mod I. Pt required supervision A verbal cues for recall only in basic card task, however when task advanced to mildly complex level pt required max A verbal cues. Pt's performance was impacted by fatigue and reduced alertness. NT assessed vital signs and SLP communicated with nurse. Pt missed 6 minutes of treatment due to fatigue. Pt was left in room with call bell within reach and bed alarm set. SLP recommends to continue skilled services.     Pain Pain Assessment Pain Score: 0-No pain  Therapy/Group: Individual Therapy  Lis Savitt  [facility] 09/24/2021, 4:49 PM

## 2021-09-24 NOTE — Consult Note (Addendum)
Cardiology Consultation:   Patient ID: Ricky Hayden MRN: [id number]; DOB: 1946/03/09  Admit date: 09/19/2021 Date of Consult: 09/24/2021  PCP:  [facility] Cardiologist:  None  Electrophysiologist:  Virl Axe, MD     Patient Profile:   Ricky Hayden is a 76 y.o. 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 Curlene Dolphin.  History of Present Illness:   Ricky Hayden is a 76 year old male with above medical history who is followed by Dr. Caryl Comes as needed, but otherwise cared for by the [facility]. Per chart review, patient was referred to Dr. Caryl Comes in 2021 for consideration of CRT-D. Per his documentation, patient reported that he had had a low EF off/on since 2016.  Patient was admitted to the hospital in 08/2019 for evaluation/treatment of acute hypoxic respiratory failure and COVID. Patient had an echocardiogram on 08/13/2019 that showed that his LVEF was <20% (down from 45-50% in 2016) , and there was severely reduced RV systolic function.  Patient was diuresed and started on GDMT, plan to undergo outpatient cardiac catheterization for further evaluation once he was no longer COVID-positive.  Patient underwent right/left heart catheterization on 09/20/2019 that showed minimal CAD, nonischemic cardiomyopathy, mild to moderate elevated filling pressures with normal cardiac output.  Patient was noticed to have frequent PVCs, and patient was encouraged to wear Zio patch to help quantify PVC burden.  Patient wore a Zio patch in 11/2019 that showed <1% PVCs.   As patient had nonischemic cardiomyopathy with persistent LV dysfunctin in the setting of LBBB, patient presented for Pacer-CRT on  01/17/2020. Patient had a BIV PPM implanted, but CS lead placement Failed. Patient returned on 01/29/2020 for BIV Upgrade and underwent successful insertion of a left bundle area pacing lead that upgraded the patient's dual chamber pacemaker to a biventricular pacemaker. Since then, patient has been followed by the [place].   Was last seen by out cardiology group on 05/15/2021. At that appointment, patient reported that he had been admitted to [facility] with CP and palpitations. Patient was found to be in atrial flutter. Started on amiodarone. Had a TEE/DCCV on 04/05/21 that was unsuccessful. After diuresis, had a DCCV on 12/8 that was successful. When he was seen on 05/15/21, EKG showed SR/V paced.   Patient presented to the [facility] emergency department on 08/29/2021 complaining of fever, chills, shortness of breath.  Patient initially required BiPAP for shortness of breath but was eventually weaned off.  Patient was found to have a fever and his urinalysis showed features concerning for UTI.  Patient was found to have urosepsis and was started on IV antibiotics.  CT abdomen showed a distended bladder with nonobstructive renal stones and an exophytic lesion on left kidney.  Patient's blood cultures returned positive for Proteus, and IV antibiotics were broadened to Cefazolin for 14 days.  On 5/2, patient had a decrease in level of consciousness and increasing oxygen requirement.  Patient required intubation and had a brief PEA arrest after intubation.  Chest x-ray showed a right infiltrate concerning for an aspiration event with H CAP.  Patient developed hypotension, septic shock and was treated with fluid boluses and pressors.  Patient had a decrease in U OP and concerns of volume overload.  Patient's acute on chronic renal failure was felt to be due to acute tubular necrosis secondary to severe UTI/septic/shock.  Patient had an EEG that was negative for seizure.  CT head was negative for acute changes.   Patient was extubated on 5/12.  Eventually, patient's AKI resolved and his renal function returned to baseline.  Delirium has been continuing to resolve but patient continued to have a delay in processing, decreased awareness of deficits, fatigue, lysed generalized weakness, and poor balance.  Due to his functional decline, patient was transferred to the physical medicine and rehabilitation service and has been working with PT/OT.  At 5:30 AM on 5/24, a rapid response was called on this patient because patient had a low BP of 79/57.  On assessment, patient was alert, able to answer questions, denied chest pain, shortness of breath.  Heart rate was found to be 131 bpm.  Patient was given 1 L normal saline with improvement in BP. EKG showed a regular, wide complex tachycardia with HR 130 (known LBBB present). Cardiology asked to consult.    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 Sherwood Gambler   Nocturia    Obesity    Pansinusitis    a.  complicated by brain abscess and bleeding requiring craniotomy in 1995.   PONV (postoperative nausea and vomiting)    Sinusitis    s/p ethmoidectomy and nasal septoplasty   Vertigo    intermitantly    Past Surgical History:  Procedure Laterality Date   BIV UPGRADE N/A  01/29/2020   Procedure: BIV UPGRADE;  Surgeon: Evans Lance, MD;  Location: [place] [facility];  Service: Cardiovascular;  Laterality: N/A;   CIRCUMCISION N/A 11/20/2013   Procedure: CIRCUMCISION ADULT;  Surgeon: Claybon Jabs, MD;  Location: [facility];  Service: Urology;  Laterality: N/A;   [place]   hematomy due to sinus infection    PACEMAKER IMPLANT N/A 01/17/2020   Procedure: PACEMAKER IMPLANT;  Surgeon: Deboraha Sprang, MD;  Location: [place] [facility];  Service: Cardiovascular;  Laterality: N/A;   RIGHT/LEFT HEART CATH AND CORONARY ANGIOGRAPHY N/A 09/20/2019   Procedure: RIGHT/LEFT HEART CATH AND CORONARY ANGIOGRAPHY;  Surgeon: Jolaine Artist, MD;  Location: [place] [facility];  Service: Cardiovascular;  Laterality: N/A;   shoulder surg rt   1995   SINUS SURGERY WITH INSTATRAK     ethmoidectomy and nasal septum repair     Home Medications:  Prior to Admission medications   Medication Sig Start Date End Date Taking? Authorizing Provider  acetaminophen (TYLENOL) 500 MG tablet Take 1,000 mg by mouth every 6 (six) hours as needed for moderate pain or  headache.    [provider]  albuterol (PROVENTIL HFA;VENTOLIN HFA) 108 (90 BASE) MCG/ACT inhaler Inhale 2 puffs into the lungs every 6 (six) hours as needed for wheezing. 12/10/14   Maryellen Pile, MD  amiodarone (PACERONE) 200 MG tablet Take 1 tablet (200 mg total) by mouth daily. 05/16/21   Deboraha Sprang, MD  apixaban (ELIQUIS) 5 MG TABS tablet Take 1 tablet by mouth in the morning and at bedtime. 03/26/21   [provider]  budesonide-formoterol (SYMBICORT) 80-4.5 MCG/ACT inhaler Inhale 2 puffs into the lungs 2 (two) times daily. 09/17/15   Corky Sox, MD  carvedilol (COREG) 6.25 MG tablet Take 1 tablet (6.25 mg total) by mouth 2 (two) times daily with a meal. 01/22/20   Bensimhon, Shaune Pascal, MD  cetirizine (ZYRTEC) 10 MG tablet Take 1 tablet by mouth daily. 01/23/21   [provider]  diclofenac  Sodium (VOLTAREN) 1 % GEL Apply 2 g topically daily as needed (pain).     [provider]  docusate sodium (COLACE) 100 MG capsule Take 1-2 capsules (100-200 mg total) by mouth 2 (two) times daily as needed for mild constipation. 03/05/14   Kelby Aline, MD  empagliflozin (JARDIANCE) 10 MG TABS tablet Take 1 tablet (10 mg total) by mouth daily before breakfast. 07/03/21   Bensimhon, Shaune Pascal, MD  feeding supplement (ENSURE ENLIVE / ENSURE PLUS) LIQD Take 237 mLs by mouth 2 (two) times daily between meals. 09/19/21   Danford, Suann Larry, MD  finasteride (PROSCAR) 5 MG tablet TAKE ONE TABLET BY MOUTH ONCE DAILY Patient taking differently: Take 5 mg by mouth daily. 03/05/15   Maryellen Pile, MD  GINKGO BILOBA PO Take 2 tablets by mouth at bedtime.    [provider]  insulin glargine-yfgn (SEMGLEE) 100 UNIT/ML injection Inject 0.08 mLs (8 Units total) into the skin daily. 09/20/21   Danford, Suann Larry, MD  Multiple Vitamins-Minerals (ICAPS AREDS 2 PO) Take 2 tablets by mouth daily.    [provider]  omeprazole (PRILOSEC) 20 MG capsule Take 1 capsule (20 mg total) by mouth daily. 12/10/14   Maryellen Pile, MD  PHENobarbital (LUMINAL) 64.8 MG tablet Take 1 tablet (64.8 mg total) by mouth 2 (two) times daily. 07/24/15   Corky Sox, MD  polyethylene glycol [facility] / Floria Raveling) packet Take 17 g by mouth daily as needed for moderate constipation. 12/10/14   Maryellen Pile, MD  pravastatin (PRAVACHOL) 40 MG tablet Take 1 tablet (40 mg total) by mouth every evening. 12/10/14   Maryellen Pile, MD  sacubitril-valsartan (ENTRESTO) 24-26 MG Take 1 tablet by mouth 2 (two) times daily. 01/22/20   Bensimhon, Shaune Pascal, MD  SPIRIVA HANDIHALER 18 MCG inhalation capsule INHALE ONE DOSE BY MOUTH ONCE DAILY Patient not taking: Reported on 08/29/2021 09/27/15   Maryellen Pile, MD  tamsulosin (FLOMAX) 0.4 MG CAPS capsule Take 1 capsule (0.4 mg total) by mouth daily. 03/05/15   Maryellen Pile, MD   torsemide (DEMADEX) 20 MG tablet Take 1 tablet (20 mg total) by mouth 2 (two) times daily. 09/19/21   Danford, Suann Larry, MD  traMADol (ULTRAM) 50 MG tablet Take 1 tablet (50 mg total) by mouth every 6 (six) hours as needed for moderate pain. 09/19/21   DanfordSuann Larry, MD    Inpatient Medications: Scheduled Meds:  amiodarone  200 mg Oral Daily   apixaban  5 mg Oral BID   vitamin C  500 mg Oral BID   carvedilol  3.125 mg  Oral BID WC   cholecalciferol  1,000 Units Oral Daily   empagliflozin  10 mg Oral QAC breakfast   finasteride  2.5 mg Oral Daily   insulin aspart  0-15 Units Subcutaneous TID WC   insulin aspart  0-5 Units Subcutaneous QHS   insulin glargine-yfgn  6 Units Subcutaneous Daily   mouth rinse  15 mL Mouth Rinse BID   melatonin  3 mg Oral QHS   mometasone-formoterol  2 puff Inhalation BID   multivitamin  1 tablet Oral QHS   nutrition supplement (JUVEN)  1 packet Oral BID BM   pantoprazole  40 mg Oral Daily   PHENobarbital  64.8 mg Oral BID   polyethylene glycol  17 g Oral Daily   pravastatin  40 mg Oral QPM   sacubitril-valsartan  1 tablet Oral BID   tamsulosin  0.8 mg Oral QPC supper   [START ON 09/26/2021] torsemide  20 mg Oral Daily   zinc sulfate  220 mg Oral Daily   Continuous Infusions:  PRN Meds: acetaminophen, albuterol, alum & mag hydroxide-simeth, bisacodyl, diphenhydrAMINE, guaiFENesin-dextromethorphan, lidocaine, polyethylene glycol, prochlorperazine **OR** prochlorperazine **OR** prochlorperazine, sodium phosphate, white petrolatum, Zinc Oxide  Allergies:    Allergies  Allergen Reactions   Calcium Channel Blockers Other (See Comments)    Came to hospital in 1995-caused chest pain     Social History:   Social History   Socioeconomic History   Marital status: Married    Spouse name: Not on file   Number of children: Not on file   Years of education: Not on file   Highest education level: Not on file  Occupational History   Not on  file  Tobacco Use   Smoking status: Former    Types: Cigarettes    Quit date: 05/04/1986    Years since quitting: 35.4   Smokeless tobacco: Never  Vaping Use   Vaping Use: Never used  Substance and Sexual Activity   Alcohol use: No    Alcohol/week: 0.0 standard drinks   Drug use: No   Sexual activity: Yes  Other Topics Concern   Not on file  Social History Narrative   Not on file   Social Determinants of Health   Financial Resource Strain: Not on file  Food Insecurity: Not on file  Transportation Needs: Not on file  Physical Activity: Not on file  Stress: Not on file  Social Connections: Not on file  Intimate Partner Violence: Not on file    Family History:    Family History  Problem Relation Age of Onset   Heart failure Mother 28   Heart attack Father 69     ROS:  Please see the history of present illness.   All other ROS reviewed and negative.     Physical Exam/Data:   Vitals:   09/24/21 1051 09/24/21 1120 09/24/21 1251 09/24/21 1348  BP: (!) 87/61 94/64 (!) 89/69 106/63  Pulse: (!) 136 (!) 134 (!) 109 (!) 118  Resp:   16 20  Temp:   98.1 F (36.7 C) 98.7 F (37.1 C)  TempSrc:    Oral  SpO2: 94%  96% 98%  Weight:      Height:        Intake/Output Summary (Last 24 hours) at 09/24/2021 1415 Last data filed at 09/24/2021 1243 Gross per 24 hour  Intake 574 ml  Output 550 ml  Net 24 ml      09/22/2021    4:48 AM 09/21/2021    3:46  AM 09/20/2021    5:39 AM  Last 3 Weights  Weight (lbs) 295 lb 6.7 oz 284 lb 6.3 oz 227 lb 1.2 oz  Weight (kg) 134 kg 129 kg 103 kg     Body mass index is 46.27 kg/m.  General:  Chronically ill appearing, no acute distress, laying flat on the bed HEENT: normal Neck: no JVD Vascular: Radial pulses 2+ bilaterally Cardiac:  normal S1, S2; RRR; no murmur  Lungs:  anterior lung exam clear to ausculation   Abd: soft, nontender, no hepatomegaly  Ext: no edema Musculoskeletal:  No deformities, BUE and BLE strength normal and  equal Skin: warm and dry  Neuro:  CNs 2-12 intact, no focal abnormalities noted Psych:  Normal affect   EKG:  The EKG was personally reviewed and demonstrates:  regular, wide complex tachycardia with HR 130 (known LBBB present) Telemetry:  Telemetry was personally reviewed and demonstrates:  NA   Relevant CV Studies:  Echocardiogram 08/29/21   1. Left ventricular ejection fraction, by estimation, is 40 to 45%. The  left ventricle has mildly decreased function. The left ventricle has no  regional wall motion abnormalities. There is mild left ventricular  hypertrophy. Left ventricular diastolic  parameters are consistent with Grade I diastolic dysfunction (impaired  relaxation).   2. Right ventricular systolic function is normal. The right ventricular  size is normal.   3. The mitral valve is normal in structure. No evidence of mitral valve  regurgitation. No evidence of mitral stenosis.   4. The aortic valve is normal in structure. There is moderate  calcification of the aortic valve. There is moderate thickening of the  aortic valve. Aortic valve regurgitation is not visualized. Aortic valve  sclerosis/calcification is present, without any   evidence of aortic stenosis.   5. Aortic dilatation noted. There is mild dilatation of the ascending  aorta, measuring 41 mm.   6. The inferior vena cava is normal in size with greater than 50%  respiratory variability, suggesting right atrial pressure of 3 mmHg.   Comparison(s): Prior images reviewed side by side. The left ventricular  function has improved.   Laboratory Data:  High Sensitivity Troponin:   Recent Labs  Lab 08/28/21 2347 08/29/21 0652 09/03/21 0850  TROPONINIHS 41* 51* 35*     Chemistry Recent Labs  Lab 09/20/21 0506 09/23/21 0516 09/24/21 0548  NA 146* 137 138  K 4.2 4.0 3.9  CL 108 99 102  CO2 33* 33* 29  GLUCOSE 109* 107* 120*  BUN 49* 39* 49*  CREATININE 1.47* 1.63* 1.71*  CALCIUM 9.5 9.4 9.5  MG 2.1   --   --   GFRNONAA 49* 43* 41*  ANIONGAP 5 5 7     Recent Labs  Lab 09/20/21 0506  PROT 7.7  ALBUMIN 2.6*  AST 24  ALT 51*  ALKPHOS 98  BILITOT 0.5   Lipids No results for input(s): CHOL, TRIG, HDL, LABVLDL, LDLCALC, CHOLHDL in the last 168 hours.  Hematology Recent Labs  Lab 09/18/21 0046 09/20/21 0506 09/23/21 0516  WBC 7.9 6.8 5.5  RBC 3.35* 3.38* 3.33*  HGB 10.4* 10.3* 10.3*  HCT 31.9* 32.9* 31.5*  MCV 95.2 97.3 94.6  MCH 31.0 30.5 30.9  MCHC 32.6 31.3 32.7  RDW 13.2 13.5 13.7  PLT 227 211 182   Thyroid No results for input(s): TSH, FREET4 in the last 168 hours.  BNPNo results for input(s): BNP, PROBNP in the last 168 hours.  DDimer No results for input(s): DDIMER  in the last 168 hours.   Radiology/Studies:  No results found.   Assessment and Plan:   Tachycardia  Suspected Atrial Flutter  -Patient was admitted to [facility] in 04/2021 for elevated heart rate, palpitations.  Patient was initially thought to be in SVT, but later found to be in atrial flutter with a left bundle branch block present.  Patient was started on amiodarone, had TEE/DCCV on 12/3 that was unsuccessful.  Patient was diuresed and had a DCCV on 12/8 that was successful -Since then patient has on and on amiodarone 200 mg daily, and Eliquis 5 mg twice daily -Around 4:30 AM this morning, vital signs showed that patient had a heart rate around 130 bpm.  BP was low, patient was given 1 L bolus normal saline with improvement of BP - HR has somewhat improved since but continues to be elevated  -EKG showed a wide complex regular tachycardia (patient has a known LBBB), suspicious for 2:1 atrial flutter   - Patient not on telemetry, but from vital signs his HR went from 62 BPM to 133 Bpm  - Given his history of atrial flutter, I suspect that he is having an episode  - Increase amiodarone to 400 mg BID for 5 days to reload patient  - Continue carvedilol as BP allows  - If load of amiodarone does  not convert patient, we could consider cardioversion   NICM  Chronic Systolic/Diastolic CHF  - Echo this admission showed EF 40-45% - Patient given IV fluids for hypotension, tachycardia. Monitor closely for signs of fluid overload  - Continue carvedilol as BP allows  - Continue jardiance  - OK to hold entresto in the setting of low BP, resume when able  - Agree with holding demadex in the setting of dehydration, elevated creatinine   S/P Biventricular implantable cardioverter-defibrillator  - Normal device function on 05/15/21    Risk Assessment/Risk Scores:   New York Heart Association (NYHA) Functional Class NYHA Class III  CHA2DS2-VASc Score = 5   This indicates a 7.2% annual risk of stroke. The patient's score is based upon: CHF History: 1 HTN History: 1 Diabetes History: 1 Stroke History: 0 Vascular Disease History: 0 Age Score: 2 Gender Score: 0         For questions or updates, please contact [place] Please consult [email or web address] for contact info under    Signed, Margie Billet, PA-C  09/24/2021 2:15 PM  Personally seen and examined. Agree with above.  Atrial flutter 2-1 conduction at 130 ventricular beats per minute noted.  He is on amiodarone 200 mg daily.  Prior cardioversions late last year.  We will go ahead and give him a bolus load of amiodarone for 100 mg twice a day for the next 5 days.  Hopefully in this timeframe, he will convert.  He has not missed any of his Eliquis.  If necessary, we can set up for cardioversion and endoscopy however this would require special attention given rehab status.  His EF is 40 to 45%.  Definitely we will continue to hold his Ricky Hayden and hold his Demadex.  His blood pressure did decrease when his heart rate increased.  Likely secondary to loss of atrial kick.  He is mentating well.  No signs of hypoperfusion at this point.  Candee Furbish, MD

## 2021-09-24 NOTE — Progress Notes (Signed)
@   approx 0433, staff nurse reported pts vs showed elevated HR, 130's with low BP. This nurse went to room, observed pt in bed in high fowlers with eyes closed, resps even unlabored @ 15-16. Aroused easily when heard his name,  was AOX4, at baseline. Denied any pain when asked was incontinent of large amount of urine in brief & smear of BM. MAE at baseline. Hand grasps equal at baseline. No facial asymmetry noted. Repositioned for comfort. Charge nurse Katharine Look notified by phone to patient status, Rapid Response Nurse notified by phone.  (89/64 RUE, HR 135, RR15-16, RA 89%, placed on 02 2L = 99%). 0435 am, charge nurse presented to room. 0450 am, Rapid Response Nurse, Shanon Brow, presented to patient room.  0500, Dan presented to nurses station & was made aware of patient status by Shanon Brow. Pt continued to be without c/o & at baseline.

## 2021-09-24 NOTE — Progress Notes (Signed)
Occupational Therapy Note  Patient Details  Name: Keyler Hoge MRN: [id number] Date of Birth: Apr 20, 1946  Today's Date: 09/24/2021 OT Missed Time: 51 Minutes Missed Time Reason: Patient ill (comment);MD hold (comment) (very low blood pressure, MD waiting on cardiology consult)  Pt received in bed alert and talking.  Supine blood pressure 87/58 and then 67/56.  MD stated cardiology would consult on pt first.  No therapy intervention at this time due to unstable vitals.    [place] 09/24/2021, 8:51 AM

## 2021-09-25 ENCOUNTER — Inpatient Hospital Stay (HOSPITAL_COMMUNITY): Payer: No Typology Code available for payment source

## 2021-09-25 ENCOUNTER — Inpatient Hospital Stay (HOSPITAL_COMMUNITY)
Admission: AD | Admit: 2021-09-25 | Discharge: 2021-10-01 | DRG: 309 | Disposition: A | Discharge status: Rehabilitation Facility | Payer: No Typology Code available for payment source | Source: Intra-hospital | Attending: Internal Medicine | Admitting: Internal Medicine

## 2021-09-25 ENCOUNTER — Other Ambulatory Visit: Payer: Self-pay | Admitting: Cardiology

## 2021-09-25 DIAGNOSIS — Z95810 Presence of automatic (implantable) cardiac defibrillator: Secondary | ICD-10-CM

## 2021-09-25 DIAGNOSIS — R5381 Other malaise: Secondary | ICD-10-CM | POA: Diagnosis present

## 2021-09-25 DIAGNOSIS — I482 Chronic atrial fibrillation, unspecified: Secondary | ICD-10-CM | POA: Diagnosis present

## 2021-09-25 DIAGNOSIS — L89326 Pressure-induced deep tissue damage of left buttock: Secondary | ICD-10-CM | POA: Diagnosis present

## 2021-09-25 DIAGNOSIS — I4819 Other persistent atrial fibrillation: Secondary | ICD-10-CM | POA: Diagnosis not present

## 2021-09-25 DIAGNOSIS — J441 Chronic obstructive pulmonary disease with (acute) exacerbation: Secondary | ICD-10-CM | POA: Diagnosis present

## 2021-09-25 DIAGNOSIS — I428 Other cardiomyopathies: Secondary | ICD-10-CM | POA: Diagnosis present

## 2021-09-25 DIAGNOSIS — I9589 Other hypotension: Secondary | ICD-10-CM | POA: Diagnosis present

## 2021-09-25 DIAGNOSIS — Z794 Long term (current) use of insulin: Secondary | ICD-10-CM

## 2021-09-25 DIAGNOSIS — Z8616 Personal history of COVID-19: Secondary | ICD-10-CM | POA: Diagnosis not present

## 2021-09-25 DIAGNOSIS — L89892 Pressure ulcer of other site, stage 2: Secondary | ICD-10-CM | POA: Diagnosis present

## 2021-09-25 DIAGNOSIS — N1831 Chronic kidney disease, stage 3a: Secondary | ICD-10-CM | POA: Diagnosis present

## 2021-09-25 DIAGNOSIS — I959 Hypotension, unspecified: Secondary | ICD-10-CM

## 2021-09-25 DIAGNOSIS — K219 Gastro-esophageal reflux disease without esophagitis: Secondary | ICD-10-CM | POA: Diagnosis present

## 2021-09-25 DIAGNOSIS — L89316 Pressure-induced deep tissue damage of right buttock: Secondary | ICD-10-CM | POA: Diagnosis present

## 2021-09-25 DIAGNOSIS — E1165 Type 2 diabetes mellitus with hyperglycemia: Secondary | ICD-10-CM | POA: Diagnosis present

## 2021-09-25 DIAGNOSIS — I509 Heart failure, unspecified: Secondary | ICD-10-CM | POA: Diagnosis not present

## 2021-09-25 DIAGNOSIS — R000 Tachycardia, unspecified: Secondary | ICD-10-CM | POA: Diagnosis not present

## 2021-09-25 DIAGNOSIS — I4891 Unspecified atrial fibrillation: Secondary | ICD-10-CM | POA: Diagnosis not present

## 2021-09-25 DIAGNOSIS — Z87891 Personal history of nicotine dependence: Secondary | ICD-10-CM

## 2021-09-25 DIAGNOSIS — N400 Enlarged prostate without lower urinary tract symptoms: Secondary | ICD-10-CM | POA: Diagnosis present

## 2021-09-25 DIAGNOSIS — N189 Chronic kidney disease, unspecified: Secondary | ICD-10-CM | POA: Diagnosis not present

## 2021-09-25 DIAGNOSIS — N390 Urinary tract infection, site not specified: Secondary | ICD-10-CM | POA: Diagnosis not present

## 2021-09-25 DIAGNOSIS — I25810 Atherosclerosis of coronary artery bypass graft(s) without angina pectoris: Secondary | ICD-10-CM | POA: Diagnosis not present

## 2021-09-25 DIAGNOSIS — R32 Unspecified urinary incontinence: Secondary | ICD-10-CM | POA: Diagnosis not present

## 2021-09-25 DIAGNOSIS — Z7951 Long term (current) use of inhaled steroids: Secondary | ICD-10-CM

## 2021-09-25 DIAGNOSIS — E1122 Type 2 diabetes mellitus with diabetic chronic kidney disease: Secondary | ICD-10-CM | POA: Diagnosis present

## 2021-09-25 DIAGNOSIS — I130 Hypertensive heart and chronic kidney disease with heart failure and stage 1 through stage 4 chronic kidney disease, or unspecified chronic kidney disease: Secondary | ICD-10-CM | POA: Diagnosis present

## 2021-09-25 DIAGNOSIS — R410 Disorientation, unspecified: Secondary | ICD-10-CM | POA: Diagnosis not present

## 2021-09-25 DIAGNOSIS — D6959 Other secondary thrombocytopenia: Secondary | ICD-10-CM | POA: Diagnosis present

## 2021-09-25 DIAGNOSIS — Z9981 Dependence on supplemental oxygen: Secondary | ICD-10-CM

## 2021-09-25 DIAGNOSIS — I5022 Chronic systolic (congestive) heart failure: Secondary | ICD-10-CM | POA: Diagnosis not present

## 2021-09-25 DIAGNOSIS — J9611 Chronic respiratory failure with hypoxia: Secondary | ICD-10-CM | POA: Diagnosis present

## 2021-09-25 DIAGNOSIS — R7303 Prediabetes: Secondary | ICD-10-CM | POA: Diagnosis not present

## 2021-09-25 DIAGNOSIS — I447 Left bundle-branch block, unspecified: Secondary | ICD-10-CM | POA: Diagnosis present

## 2021-09-25 DIAGNOSIS — M109 Gout, unspecified: Secondary | ICD-10-CM | POA: Diagnosis present

## 2021-09-25 DIAGNOSIS — Z6841 Body Mass Index (BMI) 40.0 and over, adult: Secondary | ICD-10-CM | POA: Diagnosis not present

## 2021-09-25 DIAGNOSIS — I2510 Atherosclerotic heart disease of native coronary artery without angina pectoris: Secondary | ICD-10-CM | POA: Diagnosis present

## 2021-09-25 DIAGNOSIS — Z7901 Long term (current) use of anticoagulants: Secondary | ICD-10-CM | POA: Diagnosis not present

## 2021-09-25 DIAGNOSIS — I4892 Unspecified atrial flutter: Secondary | ICD-10-CM | POA: Diagnosis present

## 2021-09-25 DIAGNOSIS — K5900 Constipation, unspecified: Secondary | ICD-10-CM | POA: Diagnosis not present

## 2021-09-25 DIAGNOSIS — D649 Anemia, unspecified: Secondary | ICD-10-CM | POA: Diagnosis not present

## 2021-09-25 DIAGNOSIS — R6521 Severe sepsis with septic shock: Secondary | ICD-10-CM | POA: Diagnosis not present

## 2021-09-25 DIAGNOSIS — Z7189 Other specified counseling: Secondary | ICD-10-CM | POA: Diagnosis not present

## 2021-09-25 DIAGNOSIS — R0602 Shortness of breath: Secondary | ICD-10-CM | POA: Diagnosis present

## 2021-09-25 DIAGNOSIS — I110 Hypertensive heart disease with heart failure: Secondary | ICD-10-CM | POA: Diagnosis not present

## 2021-09-25 DIAGNOSIS — I5042 Chronic combined systolic (congestive) and diastolic (congestive) heart failure: Secondary | ICD-10-CM | POA: Diagnosis present

## 2021-09-25 DIAGNOSIS — E785 Hyperlipidemia, unspecified: Secondary | ICD-10-CM | POA: Diagnosis present

## 2021-09-25 DIAGNOSIS — N184 Chronic kidney disease, stage 4 (severe): Secondary | ICD-10-CM | POA: Diagnosis not present

## 2021-09-25 DIAGNOSIS — I480 Paroxysmal atrial fibrillation: Secondary | ICD-10-CM | POA: Diagnosis not present

## 2021-09-25 DIAGNOSIS — N183 Chronic kidney disease, stage 3 unspecified: Secondary | ICD-10-CM | POA: Diagnosis not present

## 2021-09-25 DIAGNOSIS — I4821 Permanent atrial fibrillation: Secondary | ICD-10-CM

## 2021-09-25 DIAGNOSIS — I10 Essential (primary) hypertension: Secondary | ICD-10-CM | POA: Diagnosis not present

## 2021-09-25 DIAGNOSIS — N179 Acute kidney failure, unspecified: Secondary | ICD-10-CM

## 2021-09-25 DIAGNOSIS — A419 Sepsis, unspecified organism: Secondary | ICD-10-CM | POA: Diagnosis not present

## 2021-09-25 DIAGNOSIS — G40909 Epilepsy, unspecified, not intractable, without status epilepticus: Secondary | ICD-10-CM | POA: Diagnosis present

## 2021-09-25 DIAGNOSIS — Z515 Encounter for palliative care: Secondary | ICD-10-CM | POA: Diagnosis not present

## 2021-09-25 DIAGNOSIS — J439 Emphysema, unspecified: Secondary | ICD-10-CM | POA: Diagnosis not present

## 2021-09-25 DIAGNOSIS — J449 Chronic obstructive pulmonary disease, unspecified: Secondary | ICD-10-CM | POA: Diagnosis not present

## 2021-09-25 DIAGNOSIS — Z79899 Other long term (current) drug therapy: Secondary | ICD-10-CM

## 2021-09-25 LAB — CBC
HCT: 32 % — ABNORMAL LOW (ref 39.0–52.0)
Hemoglobin: 10.4 g/dL — ABNORMAL LOW (ref 13.0–17.0)
MCH: 30.9 pg (ref 26.0–34.0)
MCHC: 32.5 g/dL (ref 30.0–36.0)
MCV: 95 fL (ref 80.0–100.0)
Platelets: 169 10*3/uL (ref 150–400)
RBC: 3.37 MIL/uL — ABNORMAL LOW (ref 4.22–5.81)
RDW: 13.7 % (ref 11.5–15.5)
WBC: 6.1 10*3/uL (ref 4.0–10.5)
nRBC: 0 % (ref 0.0–0.2)

## 2021-09-25 LAB — GLUCOSE, CAPILLARY
Glucose-Capillary: 107 mg/dL — ABNORMAL HIGH (ref 70–99)
Glucose-Capillary: 108 mg/dL — ABNORMAL HIGH (ref 70–99)
Glucose-Capillary: 75 mg/dL (ref 70–99)
Glucose-Capillary: 123 mg/dL — ABNORMAL HIGH (ref 70–99)

## 2021-09-25 LAB — BASIC METABOLIC PANEL
Anion gap: 7 (ref 5–15)
BUN: 44 mg/dL — ABNORMAL HIGH (ref 8–23)
CO2: 29 mmol/L (ref 22–32)
Calcium: 9.6 mg/dL (ref 8.9–10.3)
Chloride: 102 mmol/L (ref 98–111)
Creatinine, Ser: 1.5 mg/dL — ABNORMAL HIGH (ref 0.61–1.24)
GFR, Estimated: 48 mL/min — ABNORMAL LOW (ref >60)
Glucose, Bld: 106 mg/dL — ABNORMAL HIGH (ref 70–99)
Potassium: 3.9 mmol/L (ref 3.5–5.1)
Sodium: 138 mmol/L (ref 135–145)

## 2021-09-25 MED ORDER — INSULIN ASPART 100 UNIT/ML IJ SOLN: 0.0000 [IU] | Freq: Every day | INTRAMUSCULAR | Status: DC

## 2021-09-25 MED ORDER — POLYETHYLENE GLYCOL 3350 17 G PO PACK
17.0000 g | PACK | Freq: Every day | ORAL | Status: DC | PRN
  Administered 2021-09-26: 17 g via ORAL

## 2021-09-25 MED ORDER — ACETAMINOPHEN 325 MG PO TABS: 325.0000 mg | ORAL_TABLET | ORAL | Status: DC | PRN

## 2021-09-25 MED ORDER — ORAL CARE MOUTH RINSE
15.0000 mL | Freq: Two times a day (BID) | OROMUCOSAL | Status: DC
  Administered 2021-09-25 – 2021-10-01 (×8): 15 mL via OROMUCOSAL

## 2021-09-25 MED ORDER — BISACODYL 10 MG RE SUPP
10.0000 mg | Freq: Every day | RECTAL | Status: DC | PRN
Start: 2021-09-25 — End: 2021-10-01

## 2021-09-25 MED ORDER — CARVEDILOL 3.125 MG PO TABS
3.1250 mg | ORAL_TABLET | Freq: Two times a day (BID) | ORAL | Status: DC
Start: 2021-09-25 — End: 2021-09-25

## 2021-09-25 MED ORDER — INSULIN ASPART 100 UNIT/ML IJ SOLN
0.0000 [IU] | Freq: Three times a day (TID) | INTRAMUSCULAR | Status: DC
  Administered 2021-09-28: 3 [IU] via SUBCUTANEOUS
  Administered 2021-09-28 – 2021-09-29 (×2): 2 [IU] via SUBCUTANEOUS

## 2021-09-25 MED ORDER — AMIODARONE HCL 200 MG PO TABS: 200.0000 mg | ORAL_TABLET | Freq: Every day | ORAL | Status: DC

## 2021-09-25 MED ORDER — SODIUM CHLORIDE 0.9 % IV BOLUS
500.0000 mL | Freq: Once | INTRAVENOUS | Status: AC
  Administered 2021-09-25: 500 mL via INTRAVENOUS

## 2021-09-25 MED ORDER — SODIUM CHLORIDE 0.9 % IV SOLN: INTRAVENOUS | Status: DC

## 2021-09-25 MED ORDER — MOMETASONE FURO-FORMOTEROL FUM 100-5 MCG/ACT IN AERO
2.0000 | INHALATION_SPRAY | Freq: Two times a day (BID) | RESPIRATORY_TRACT | Status: DC
  Administered 2021-09-25 – 2021-09-27 (×4): 2 via RESPIRATORY_TRACT
  Filled 2021-09-25: qty 8.8

## 2021-09-25 MED ORDER — ENOXAPARIN SODIUM 150 MG/ML IJ SOSY
130.0000 mg | PREFILLED_SYRINGE | Freq: Two times a day (BID) | INTRAMUSCULAR | Status: DC
  Administered 2021-09-25 – 2021-10-01 (×12): 130 mg via SUBCUTANEOUS
  Filled 2021-09-25 (×13): qty 0.86

## 2021-09-25 MED ORDER — ALBUTEROL SULFATE (2.5 MG/3ML) 0.083% IN NEBU: 2.5000 mg | INHALATION_SOLUTION | RESPIRATORY_TRACT | Status: DC | PRN

## 2021-09-25 MED ORDER — PHENOBARBITAL 32.4 MG PO TABS
64.8000 mg | ORAL_TABLET | Freq: Two times a day (BID) | ORAL | Status: DC
  Administered 2021-09-25 – 2021-10-01 (×12): 64.8 mg via ORAL
  Filled 2021-09-25 (×13): qty 2

## 2021-09-25 MED ORDER — AMIODARONE HCL IN DEXTROSE 360-4.14 MG/200ML-% IV SOLN
30.0000 mg/h | INTRAVENOUS | Status: DC
  Administered 2021-09-25 – 2021-09-27 (×4): 30 mg/h via INTRAVENOUS
  Filled 2021-09-25 (×4): qty 200

## 2021-09-25 MED ORDER — APIXABAN 5 MG PO TABS: 5.0000 mg | ORAL_TABLET | Freq: Two times a day (BID) | ORAL | Status: DC

## 2021-09-25 MED ORDER — MELATONIN 3 MG PO TABS
3.0000 mg | ORAL_TABLET | Freq: Every day | ORAL | Status: DC
  Administered 2021-09-25 – 2021-09-30 (×6): 3 mg via ORAL
  Filled 2021-09-25 (×6): qty 1

## 2021-09-25 MED ORDER — TORSEMIDE 20 MG PO TABS: 20.0000 mg | ORAL_TABLET | Freq: Every day | ORAL | Status: DC

## 2021-09-25 MED ORDER — PRAVASTATIN SODIUM 40 MG PO TABS
40.0000 mg | ORAL_TABLET | Freq: Every evening | ORAL | Status: DC
  Administered 2021-09-25 – 2021-09-30 (×6): 40 mg via ORAL
  Filled 2021-09-25 (×6): qty 1

## 2021-09-25 MED ORDER — PANTOPRAZOLE SODIUM 40 MG PO TBEC
40.0000 mg | DELAYED_RELEASE_TABLET | Freq: Every day | ORAL | Status: DC
  Administered 2021-09-26 – 2021-10-01 (×6): 40 mg via ORAL
  Filled 2021-09-25 (×6): qty 1

## 2021-09-25 MED ORDER — INSULIN GLARGINE-YFGN 100 UNIT/ML ~~LOC~~ SOLN
6.0000 [IU] | Freq: Every day | SUBCUTANEOUS | Status: DC
  Administered 2021-09-26 – 2021-10-01 (×6): 6 [IU] via SUBCUTANEOUS
  Filled 2021-09-25 (×6): qty 0.06

## 2021-09-25 MED ORDER — AMIODARONE HCL 200 MG PO TABS: 400.0000 mg | ORAL_TABLET | Freq: Two times a day (BID) | ORAL | Status: DC

## 2021-09-25 MED ORDER — VITAMIN D 25 MCG (1000 UNIT) PO TABS
1000.0000 [IU] | ORAL_TABLET | Freq: Every day | ORAL | Status: DC
  Administered 2021-09-26 – 2021-10-01 (×6): 1000 [IU] via ORAL
  Filled 2021-09-25 (×6): qty 1

## 2021-09-25 MED ORDER — AMIODARONE HCL IN DEXTROSE 360-4.14 MG/200ML-% IV SOLN
60.0000 mg/h | INTRAVENOUS | Status: DC
  Administered 2021-09-25: 60 mg/h via INTRAVENOUS
  Filled 2021-09-25 (×2): qty 200

## 2021-09-25 MED ORDER — POLYETHYLENE GLYCOL 3350 17 G PO PACK
17.0000 g | PACK | Freq: Every day | ORAL | Status: DC
  Administered 2021-09-27 – 2021-10-01 (×5): 17 g via ORAL
  Filled 2021-09-25 (×6): qty 1

## 2021-09-25 MED ORDER — FINASTERIDE 2.5 MG HALF TABLET
2.5000 mg | ORAL_TABLET | Freq: Every day | ORAL | Status: DC
  Administered 2021-09-26 – 2021-10-01 (×6): 2.5 mg via ORAL
  Filled 2021-09-25 (×6): qty 1

## 2021-09-25 MED ORDER — TORSEMIDE 20 MG PO TABS
20.0000 mg | ORAL_TABLET | Freq: Every day | ORAL | Status: DC
Start: 2021-09-27 — End: 2021-09-25

## 2021-09-25 NOTE — H&P (Signed)
NAME:  Ricky Hayden, MRN:  [id number], DOB:  1945-08-22, LOS: 0 ADMISSION DATE:  09/25/2021, CONSULTATION DATE:  09/25/2021 REFERRING MD:  Dr. Natale Lay, CHIEF COMPLAINT:  Hypotension    History of Present Illness:  Ricky Hayden is a 76yo male with a PMH of AAA, cardiomyopathy, CHF, COPD, CKD, CAD, GERD, HTN, HLD, obesity, and arthritis who initially presented to [facility] ED 4/27 for complaints of SOB with hypoxia. Of note patient was seen by PCP 3 weeks prior for hematuria and was found to have proteus mirabilis bacteruria for which he was started on Bactrim.   On ED arrival patient was seen with hypoxic for which BIPAP was started, he was also seen febrile with t-max 103. UA remains concerning for active UTI. He was admitted per [facility] for sepsis workup.   Pertinent timeline for pervious admit below  4/27: admitted to [facility] for sepsis 2/2 UTI, started ceftriaxone and azithro 4/27-5/1 on ceftriaxone 5/2:  antibiotics deescalated to cefazolin. PCCM consult for hypoxia. Placed on BIPAP 5/3: Bipap all night, worsening mental status and hypoxemia, intubated, short PEA arrest after intubation, hypotensive, central line placed, CXR in AM clear, CXR after intuabntion diffuse R sided infiltrates concerning for aspiration vs HAP 5/4 stable pressors, o2 weaned, Cr elevated, Uop diminished but not oliguric,  some flexure posturing, EEG ordered - negative 5/5 pressor off, minimal vent settings, seems to follow commands, UOP improved, nephrology consult at family request 5/7 patient received 80 mg IV Lasix overnight due to increased CVP and crackles to bases 5/12 Extubated, ID signed off  5/19 Discharged to CIR  Mid morning of 5/25 patent was seen with rhythm change (unstable A--fib) with subsequent hypotension for which PCCM was consulted to assist in management   Pertinent  Medical History  HFrEF due to cardiomyopathy COPD CKD 3a GERD BPH Gout HTN Remote history of brain surgery, on chronic  phenobarb to prevent seizure but no history of seizures per daughter meningitis    Significant Hospital Events: Including procedures, antibiotic start and stop dates in addition to other pertinent events   5/25 Seen with rhythm change (unstable A--fib) with subsequent hypotension for which PCCM was consulted to assist in management   Interim History / Subjective:  As above   Subjective: complaints of weakness. Denies SOB and Chest pain Objective   There were no vitals taken for this visit.       No intake or output data in the 24 hours ending 09/25/21 1518  There were no vitals filed for this visit.   Examination: General: In bed, NAD, frail appearing HEENT: MM pink/moist, anicteric, atraumatic Neuro: RASS 0, PERRL 77mm, A&O x3 CV: S1S2, Afib, no m/r/g appreciated PULM:  air movement in all lobes, no crackes, trachea midline, chest expansion symmetric GI: soft, bsx4 active, non-tender   Extremities: warm/dry, scant pretibial edema, capillary refill less than 3 seconds  Skin:  no rashes or lesions noted  Labs: Creat 1.5, BUN 44 5/23 CBC WBC 5.5, HGB 10.3 12 lead: ?ST, LBBB, LAD  Resolved Hospital Problem list     Assessment & Plan:  [place] Atrial flutter/Afib with RVR Shock, suspect secondary to Afib with RVR, WBC 5.5, Tmax 98.8, no signs of infection noted. HX CHF.  S/P 500 ml IVF -Start amio GTT with bolus -Admit to ICU -Continuous tele and SpO2 monitoring -Continue eliquis -Cardiology following, appreciate assistance. Cards possibly cardioverting on 5/25 -Goal MAP 60-65. Will initiate peripheral levophed if map goal not met -Recheck CBC, check CXR  NICM Chronic Systolic/diastolic CHF, ECHO this admission EF 40-45% S/P biventricular implantable cardioverter-defibrillator -Hold carvedilol , entresto jardiance, torsemide -Cardiology following, appreciate assistance  COPD On 2L [place] -Continue dulera -Pulm toilet as able -Goal O2 sat 90-96%  HX CKD   BPH/Nephrolithiasis -Stop flowmax with hypotension -Continue proscar -Ensure renal perfusion. Goal MAP 65 or greater. -Avoid neprotoxic drugs as possible. -Strict I&O's -Follow up AM creatinine  H/O crani w/seizures Patient on phenobarb since 1995 and seizure free per chart. -Continue home phenobarbitol 64.8MG  BID at this time. Discussed with pharmacy regarding interaction between [place] and eliquis. Will discuss with Dr. Tacy Learn.  HX HTN Now hypotensive -Hold home antihypertensives  Hyperglycemia -Hold jardiance -SSI with meal doses -Continue Semglee  GOC Palliative care consulted at [facility] -Continue PC assistance  Best Practice (right click and "Reselect all SmartList Selections" daily)   Diet/type: NPO w/ oral meds DVT prophylaxis: DOAC GI prophylaxis: PPI Lines: N/A Foley:  N/A Code Status:  full code Last date of multidisciplinary goals of care discussion [Pending]  Labs   CBC: Recent Labs  Lab 09/20/21 0506 09/23/21 0516  WBC 6.8 5.5  NEUTROABS 5.4  --   HGB 10.3* 10.3*  HCT 32.9* 31.5*  MCV 97.3 94.6  PLT 211 409    Basic Metabolic Panel: Recent Labs  Lab 09/20/21 0506 09/23/21 0516 09/24/21 0548 09/25/21 0545  NA 146* 137 138 138  K 4.2 4.0 3.9 3.9  CL 108 99 102 102  CO2 33* 33* 29 29  GLUCOSE 109* 107* 120* 106*  BUN 49* 39* 49* 44*  CREATININE 1.47* 1.63* 1.71* 1.50*  CALCIUM 9.5 9.4 9.5 9.6  MG 2.1  --   --   --    GFR: Estimated Creatinine Clearance: 53.7 mL/min (A) (by C-G formula based on SCr of 1.5 mg/dL (H)). Recent Labs  Lab 09/20/21 0506 09/23/21 0516  WBC 6.8 5.5    Liver Function Tests: Recent Labs  Lab 09/20/21 0506  AST 24  ALT 51*  ALKPHOS 98  BILITOT 0.5  PROT 7.7  ALBUMIN 2.6*   No results for input(s): LIPASE, AMYLASE in the last 168 hours. No results for input(s): AMMONIA in the last 168 hours.  ABG    Component Value Date/Time   PHART 7.392 09/08/2021 0830   PCO2ART 50.2 (H) 09/08/2021 0830    PO2ART 62 (L) 09/08/2021 0830   HCO3 30.6 (H) 09/08/2021 0830   TCO2 32 09/08/2021 0830   O2SAT 71.5 09/09/2021 1050     Coagulation Profile: No results for input(s): INR, PROTIME in the last 168 hours.  Cardiac Enzymes: No results for input(s): CKTOTAL, CKMB, CKMBINDEX, TROPONINI in the last 168 hours.  HbA1C: Hemoglobin A1C  Date/Time Value Ref Range Status  10/05/2012 03:13 PM 5.4  Final   Hgb A1c MFr Bld  Date/Time Value Ref Range Status  09/02/2021 06:22 PM 5.7 (H) 4.8 - 5.6 % Final    Comment:    (NOTE) Pre diabetes:          5.7%-6.4%  Diabetes:              >6.4%  Glycemic control for   <7.0% adults with diabetes   02/06/2008 11:25 PM   Final   5.4 (NOTE)   The ADA recommends the following therapeutic goal for glycemic   control related to Hgb A1C measurement:   Goal of Therapy:   < 7.0% Hgb A1C   Reference: American Diabetes Association: Clinical Practice   Recommendations 2008, Diabetes Care,  2008, 31:(Suppl 1).    CBG: Recent Labs  Lab 09/24/21 1130 09/24/21 1623 09/24/21 2123 09/25/21 0556 09/25/21 1206  GLUCAP 108* 90 131* 107* 108*    Review of Systems:   Positives in bold  Gen: fever, chills, weight change, fatigue, night sweats HEENT:  blurred vision, double vision, hearing loss, tinnitus, sinus congestion, rhinorrhea, sore throat, neck stiffness, dysphagia PULM:  shortness of breath, cough, sputum production, hemoptysis, wheezing CV: chest pain, edema, orthopnea, paroxysmal nocturnal dyspnea, palpitations GI:  abdominal pain, nausea, vomiting, diarrhea, hematochezia, melena, constipation, change in bowel habits GU: dysuria, hematuria, polyuria, oliguria, urethral discharge Endocrine: hot or cold intolerance, polyuria, polyphagia or appetite change Derm: rash, dry skin, scaling or peeling skin change Heme: easy bruising, bleeding, bleeding gums Neuro: headache, numbness, weakness, slurred speech, loss of memory or consciousness   Past  Medical History:  He,  has a past medical history of Arthritis, Ascending aorta dilatation (HCC), Balanitis, Cardiac arrhythmia, Cardiomyopathy ([place]), Chronic joint pain, Chronic systolic CHF (congestive heart failure) ([place]), CKD (chronic kidney disease), stage III ([place]), COPD (chronic obstructive pulmonary disease) ([place]), Coronary artery disease, Dilated aortic root ([place]), Enlarged prostate, Erectile dysfunction, GERD (gastroesophageal reflux disease), Gout, Hiatal hernia, Hyperlipidemia, Hypertension, Intracranial hematoma ([place]) (1995), Nocturia, Obesity, Pansinusitis, PONV (postoperative nausea and vomiting), Sinusitis, and Vertigo.   Surgical History:   Past Surgical History:  Procedure Laterality Date   BIV UPGRADE N/A 01/29/2020   Procedure: BIV UPGRADE;  Surgeon: Evans Lance, MD;  Location: [facility];  Service: Cardiovascular;  Laterality: N/A;   CIRCUMCISION N/A 11/20/2013   Procedure: CIRCUMCISION ADULT;  Surgeon: Claybon Jabs, MD;  Location: [facility];  Service: Urology;  Laterality: N/A;   [place]   hematomy due to sinus infection    PACEMAKER IMPLANT N/A 01/17/2020   Procedure: PACEMAKER IMPLANT;  Surgeon: Deboraha Sprang, MD;  Location: [facility];  Service: Cardiovascular;  Laterality: N/A;   RIGHT/LEFT HEART CATH AND CORONARY ANGIOGRAPHY N/A 09/20/2019   Procedure: RIGHT/LEFT HEART CATH AND CORONARY ANGIOGRAPHY;  Surgeon: Jolaine Artist, MD;  Location: [facility];  Service: Cardiovascular;  Laterality: N/A;   shoulder surg rt   1995   SINUS SURGERY WITH INSTATRAK     ethmoidectomy and nasal septum repair     Social History:   reports that he quit smoking about 35 years ago. He has never used smokeless tobacco. He reports that he does not drink alcohol and does not use drugs.   Family History:  His family history includes Heart attack (age of onset: 39) in his father; Heart failure (age of onset: 54) in his mother.   Allergies Allergies   Allergen Reactions   Calcium Channel Blockers Other (See Comments)    Came to hospital in 1995-caused chest pain      Home Medications  Prior to Admission medications   Medication Sig Start Date End Date Taking? Authorizing Provider  acetaminophen (TYLENOL) 500 MG tablet Take 1,000 mg by mouth every 6 (six) hours as needed for moderate pain or headache.    [provider]  albuterol (PROVENTIL HFA;VENTOLIN HFA) 108 (90 BASE) MCG/ACT inhaler Inhale 2 puffs into the lungs every 6 (six) hours as needed for wheezing. 12/10/14   Maryellen Pile, MD  amiodarone (PACERONE) 200 MG tablet Take 1 tablet (200 mg total) by mouth daily. 05/16/21   Deboraha Sprang, MD  apixaban (ELIQUIS) 5 MG TABS tablet Take 1 tablet by mouth in the  morning and at bedtime. 03/26/21   [provider]  budesonide-formoterol (SYMBICORT) 80-4.5 MCG/ACT inhaler Inhale 2 puffs into the lungs 2 (two) times daily. 09/17/15   Corky Sox, MD  carvedilol (COREG) 6.25 MG tablet Take 1 tablet (6.25 mg total) by mouth 2 (two) times daily with a meal. 01/22/20   Bensimhon, Shaune Pascal, MD  cetirizine (ZYRTEC) 10 MG tablet Take 1 tablet by mouth daily. 01/23/21   [provider]  diclofenac Sodium (VOLTAREN) 1 % GEL Apply 2 g topically daily as needed (pain).     [provider]  docusate sodium (COLACE) 100 MG capsule Take 1-2 capsules (100-200 mg total) by mouth 2 (two) times daily as needed for mild constipation. 03/05/14   Kelby Aline, MD  empagliflozin (JARDIANCE) 10 MG TABS tablet Take 1 tablet (10 mg total) by mouth daily before breakfast. 07/03/21   Bensimhon, Shaune Pascal, MD  feeding supplement (ENSURE ENLIVE / ENSURE PLUS) LIQD Take 237 mLs by mouth 2 (two) times daily between meals. 09/19/21   Danford, Suann Larry, MD  finasteride (PROSCAR) 5 MG tablet TAKE ONE TABLET BY MOUTH ONCE DAILY Patient taking differently: Take 5 mg by mouth daily. 03/05/15   Maryellen Pile, MD  GINKGO BILOBA PO Take 2  tablets by mouth at bedtime.    [provider]  insulin glargine-yfgn (SEMGLEE) 100 UNIT/ML injection Inject 0.08 mLs (8 Units total) into the skin daily. 09/20/21   Danford, Suann Larry, MD  Multiple Vitamins-Minerals (ICAPS AREDS 2 PO) Take 2 tablets by mouth daily.    [provider]  omeprazole (PRILOSEC) 20 MG capsule Take 1 capsule (20 mg total) by mouth daily. 12/10/14   Maryellen Pile, MD  PHENobarbital (LUMINAL) 64.8 MG tablet Take 1 tablet (64.8 mg total) by mouth 2 (two) times daily. 07/24/15   Corky Sox, MD  polyethylene glycol [facility] / Floria Raveling) packet Take 17 g by mouth daily as needed for moderate constipation. 12/10/14   Maryellen Pile, MD  pravastatin (PRAVACHOL) 40 MG tablet Take 1 tablet (40 mg total) by mouth every evening. 12/10/14   Maryellen Pile, MD  sacubitril-valsartan (ENTRESTO) 24-26 MG Take 1 tablet by mouth 2 (two) times daily. 01/22/20   Bensimhon, Shaune Pascal, MD  SPIRIVA HANDIHALER 18 MCG inhalation capsule INHALE ONE DOSE BY MOUTH ONCE DAILY Patient not taking: Reported on 08/29/2021 09/27/15   Maryellen Pile, MD  tamsulosin (FLOMAX) 0.4 MG CAPS capsule Take 1 capsule (0.4 mg total) by mouth daily. 03/05/15   Maryellen Pile, MD  torsemide (DEMADEX) 20 MG tablet Take 1 tablet (20 mg total) by mouth 2 (two) times daily. 09/19/21   Danford, Suann Larry, MD  traMADol (ULTRAM) 50 MG tablet Take 1 tablet (50 mg total) by mouth every 6 (six) hours as needed for moderate pain. 09/19/21   DanfordSuann Larry, MD     Critical care time: 37 minutes    [employer]., MSN, APRN, AGACNP-BC [facility]  09/25/2021 , 3:20 PM  Please see [email or web address] for pager details  If no response, please call [phone] After hours, please call Elink at [phone]

## 2021-09-25 NOTE — Progress Notes (Signed)
Physical Therapy Session Note  Patient Details  Name: Ricky Hayden MRN: [id number] Date of Birth: 1945-07-30  Today's Date: 09/25/2021 PT Individual Time: 4098-1191 PT Individual Time Calculation (min): 50 min   Short Term Goals: Week 1:  PT Short Term Goal 1 (Week 1): Pt will increase rolling to mod A. PT Short Term Goal 2 (Week 1): Pt will increase supine to edge of bed to mod A. PT Short Term Goal 3 (Week 1): Pt will increase transfers bed to chair to mod A. PT Short Term Goal 4 (Week 1): Pt will increase ambulation with LRAD about 10 feet with mod A. PT Short Term Goal 5 (Week 1): Pt will ascend/descend 4 stairs with B rails and mod A.  Skilled Therapeutic Interventions/Progress Updates:    MD cleared pt to participate in therapy per pt's tolerance while monitoring BP to avoid hypotension.   Pt received supine in bed, HOB elevated, still eating breakfast upon therapist arrival but agreeable to participate in therapy session. Pt wearing 2L of O2 via nasal cannula throughout session. Assessed vitals throughout as written.  Supine in bed at beginning of session with HOB elevated: BP 144/116 (MAP 124), HR 108-133bpm, SpO2 100%  Pt reporting pain on buttocks from prolonged supine positioning - rolled L in bed using bedrails with increased time and min assist using bedpads to roll hips completely - pt reporting pain in R LE (chronic). Vitals while in L sidelying: BP 146/107 (MAP 121), HR 130bpm, SpO2 92%  Cardiology PA in/out. Reports plan is to continue with medication management at this time with monitoring. States only specific restriction for therapy is to maintain systolic BP >478 during otherwise monitor pt closely.   Rolled from L side to R side using bedrails with significantly increased time but no physical assistance. As pt rolled to R reports onset of vertigo with visible nystagmus - symptoms lasted <30seconds. Pt confirms hx of "vertigo" - would benefit from vestibular  assessment once medically stable and physical able to participate safely.  R sidelying>sitting EOB with HOB slightly elevated and light mod assist for trunk upright - while coming upright pt again reports onset of vertigo with visible nystagmus lasting <1 minute.Marland Kitchen   Positioned stedy in front of pt for safety. Vitals sitting EOB initially: BP 172/146 (MAP 155)   Reassessed vitals in 3 minutes after sitting EOB: BP 76/61 (MAP 68), HR 126bpm  With pt reporting feeling "tired, weak"  Sit>supine using bedrails for trunk descent and mod assist for B  LE management onto bed.  Reassessed vitals in supine: BP 91/57 (MAP 68), HR 131bpm, SpO2 99%  Notified MD and nursing staff of pt's vitals during therapy session.   Pt left supine in bed with needs in reach.  Therapy Documentation Precautions:  Precautions Precautions: Fall Precaution Comments: 2L O2 Restrictions Weight Bearing Restrictions: No   Pain: Reports on/off R LE pain "for years" - no specific interventions needed during session but therapist mindful of cautious facilitation when moving LEs during mobility.    Therapy/Group: Individual Therapy  Tawana Scale , PT, DPT, NCS, CSRS 09/25/2021, 7:56 AM

## 2021-09-25 NOTE — Progress Notes (Signed)
Occupational Therapy Session Note  Patient Details  Name: Ricky Hayden MRN: [id number] Date of Birth: 1945/12/01  Today's Date: 09/25/2021 OT Individual Time: 1045-1130 OT Individual Time Calculation (min): 45 min  and Today's Date: 09/25/2021 OT Missed Time: 30 Minutes Missed Time Reason: Patient fatigue;Other (comment) (low blood pressure)   Short Term Goals: Week 1:  OT Short Term Goal 1 (Week 1): Pt will sit EOB for >5 min to complete ADL with min A for balance. OT Short Term Goal 2 (Week 1): Pt will complete toilet transfer with max A and LRAD. OT Short Term Goal 3 (Week 1): Pt will complete grooming activity at sink with MIN a. OT Short Term Goal 4 (Week 1): Pt will don shirt with max A.  Skilled Therapeutic Interventions/Progress Updates:   Pt received in bed awake and alert, talking about his family.      Blood pressure assessed in supine at 75/63 and then rechecked at 104/61.  Pt worked on a few LE ROM exercises of sliding legs side to side only a few reps. Pt agreeable to sitting to EOB but stated he was very tired. With extra time, pt able to use bed rails and elevated bed to sit to EOB with light CGA.  Once sitting he held his static sitting balance with Supervision.  Cardiologist arrived, BP checked 3x and it was low varying from 40-98 systolic.  Pt kept saying he was tired and did not want to continue therapy at this time.  Pt encouraged to try 1 stand but he said he was too fatigued.   Pt needed mod A to supine to A lifting his legs into bed. When pt first layed down his eyes did beat upward and pt stated he was dizzy, but resolved within a few seconds.  Pt did help to scoot up in bed by pushing up through his feet.   Palliative Care arrived to talk with pt.  Pt in bed with all needs met.    Therapy Documentation Precautions:  Precautions Precautions: Fall Precaution Comments: 2L O2 Restrictions Weight Bearing Restrictions: No    Vital Signs: Oxygen  Therapy SpO2: 98 % O2 Device: Nasal Cannula O2 Flow Rate (L/min): 2 L/min Pain:   ADL: ADL Eating: Moderate assistance Where Assessed-Eating: Bed level Grooming: Minimal assistance Where Assessed-Grooming: Edge of bed Upper Body Bathing: Maximal assistance Where Assessed-Upper Body Bathing: Edge of bed Lower Body Bathing: Dependent Where Assessed-Lower Body Bathing: Bed level Upper Body Dressing: Dependent Where Assessed-Upper Body Dressing: Bed level Lower Body Dressing: Dependent Where Assessed-Lower Body Dressing: Bed level Toileting: Unable to assess Toilet Transfer: Unable to assess Tub/Shower Transfer: Not assessed [employer] Transfer: Not assessed   Therapy/Group: Individual Therapy  [place] 09/25/2021, 8:28 AM

## 2021-09-25 NOTE — Progress Notes (Addendum)
Writer notified by Laney Pastor, Nurse Tech that patient's heart rate is bouncing from the high 30s to 140, patient is hypotensive and states that he  feel very weak and tired. P Love, PA notified and a 12 lead EKG was ordered and performed.Rapid response called. Pt is muse red. 1335-Patient being discharged from [facility] and admitted to [facility].

## 2021-09-25 NOTE — Progress Notes (Signed)
Inpatient Rehabilitation Care Coordinator Discharge Note   Patient Details  Name: Reise Gladney MRN: [id number] Date of Birth: Feb 19, 1946   Discharge location: Transferring to acute for medical issues  Length of Stay: 6 days  Discharge activity level: mod-max level  Home/community participation: active  Patient response HQ:PRFFMB Literacy - How often do you need to have someone help you when you read instructions, pamphlets, or other written material from your doctor or pharmacy?: Never  Patient response WG:[id number] Isolation - How often do you feel lonely or isolated from those around you?: Never  Services provided included: MD, RD, PT, OT, SLP, RN, CM, Pharmacy, SW  Financial Services:  Financial Services Utilized: [employer]  Choices offered to/list presented to:    Follow-up services arranged:              Patient response to transportation need: Is the patient able to respond to transportation needs?: Yes In the past 12 months, has lack of transportation kept you from medical appointments or from getting medications?: No In the past 12 months, has lack of transportation kept you from meetings, work, or from getting things needed for daily living?: No    Comments (or additional information):Needs monitored bed going to acute. Have [facility]  Patient/Family verbalized understanding of follow-up arrangements:  Yes  Individual responsible for coordination of the follow-up plan: amanda-daughter  Confirmed correct DME delivered: Elease Hashimoto 09/25/2021    Elease Hashimoto

## 2021-09-25 NOTE — Progress Notes (Signed)
PROGRESS NOTE   Subjective/Complaints: No acute events overnight. He was seen by cardiology and amiodarone dose was increased.  ROS: no CP or SOB, fevers or chills. No vision changes.    Objective:   No results found. Recent Labs    09/23/21 0516  WBC 5.5  HGB 10.3*  HCT 31.5*  PLT 182    Recent Labs    09/24/21 0548 09/25/21 0545  NA 138 138  K 3.9 3.9  CL 102 102  CO2 29 29  GLUCOSE 120* 106*  BUN 49* 44*  CREATININE 1.71* 1.50*  CALCIUM 9.5 9.6     Intake/Output Summary (Last 24 hours) at 09/25/2021 0743 Last data filed at 09/25/2021 1975 Gross per 24 hour  Intake 597 ml  Output 1600 ml  Net -1003 ml      Pressure Injury 09/09/21 Buttocks Medial;Bilateral Deep Tissue Pressure Injury - Purple or maroon localized area of discolored intact skin or blood-filled blister due to damage of underlying soft tissue from pressure and/or shear. purple/red with petechi (Active)  09/09/21 2100  Location: Buttocks  Location Orientation: Medial;Bilateral  Staging: Deep Tissue Pressure Injury - Purple or maroon localized area of discolored intact skin or blood-filled blister due to damage of underlying soft tissue from pressure and/or shear.  Wound Description (Comments): purple/red with petechiae  Present on Admission: No     Pressure Injury 09/17/21 Anus Medial Stage 2 -  Partial thickness loss of dermis presenting as a shallow open injury with a red, pink wound bed without slough. (Active)  09/17/21 1115  Location: Anus  Location Orientation: Medial  Staging: Stage 2 -  Partial thickness loss of dermis presenting as a shallow open injury with a red, pink wound bed without slough.  Wound Description (Comments):   Present on Admission:     Physical Exam: Vital Signs Blood pressure 96/72, pulse (!) 124, temperature 98.4 F (36.9 C), temperature source Oral, resp. rate 18, height 5\' 7"  (1.702 m), weight 131 kg,  SpO2 99 %.  Vitals and nursing note reviewed. BMI 35.56 HENT:     Head: Well healed old crani incision.  In bed appears comfortable. Pulmonary:     Comments: CTAB, normal effort, Room air Cardiac: Tachycardic, no murmur Abdominal:     General: There is distension.     Tenderness: There is no abdominal tenderness.  + BS Neurological:     Mental Status: Alert and oriented to self, place[place], month, follows commands MSK: protracted posture. No focal deficits. No edema  Assessment/Plan: 1. Functional deficits which require 3+ hours per day of interdisciplinary therapy in a comprehensive inpatient rehab setting. Physiatrist is providing close team supervision and 24 hour management of active medical problems listed below. Physiatrist and rehab team continue to assess barriers to discharge/monitor patient progress toward functional and medical goals  Care Tool:  Bathing        Body parts bathed by helper: Face     Bathing assist Assist Level: Minimal Assistance - Patient > 75%     Upper Body Dressing/Undressing Upper body dressing   What is the patient wearing?: Hospital gown only    Upper body assist Assist  Level: Moderate Assistance - Patient 50 - 74%    Lower Body Dressing/Undressing Lower body dressing      What is the patient wearing?: Incontinence brief     Lower body assist Assist for lower body dressing: Maximal Assistance - Patient 25 - 49%     Toileting Toileting Toileting Activity did not occur (Clothing management and hygiene only): N/A (no void or bm)  Toileting assist Assist for toileting: Total Assistance - Patient < 25%     Transfers Chair/bed transfer  Transfers assist  Chair/bed transfer activity did not occur: Safety/medical concerns  Chair/bed transfer assist level: Dependent - mechanical lift (mod+2 with stedy)     Locomotion Ambulation   Ambulation assist      Assist level: Maximal Assistance - Patient 25 -  49% Assistive device: No Device Max distance: 2   Walk 10 feet activity   Assist  Walk 10 feet activity did not occur: Safety/medical concerns        Walk 50 feet activity   Assist Walk 50 feet with 2 turns activity did not occur: Safety/medical concerns         Walk 150 feet activity   Assist Walk 150 feet activity did not occur: Safety/medical concerns         Walk 10 feet on uneven surface  activity   Assist Walk 10 feet on uneven surfaces activity did not occur: Safety/medical concerns         Wheelchair     Assist Is the patient using a wheelchair?: Yes Type of Wheelchair: Manual    Wheelchair assist level: Minimal Assistance - Patient > 75% Max wheelchair distance: 49'    Wheelchair 50 feet with 2 turns activity    Assist        Assist Level: Minimal Assistance - Patient > 75%   Wheelchair 150 feet activity     Assist      Assist Level: Dependent - Patient 0%   Blood pressure 96/72, pulse (!) 124, temperature 98.4 F (36.9 C), temperature source Oral, resp. rate 18, height 5\' 7"  (1.702 m), weight 131 kg, SpO2 99 %.  Medical Problem List and Plan: 1. Functional deficits secondary to debility from septic shock.             -patient may shower             -ELOS/Goals: S 10-14 days             Continue CIR PT/OT/SLP  -Team conference yesterday goals Mod A to Sup 2.  Antithrombotics: -DVT/anticoagulation:  Pharmaceutical: Other (comment) Eliquis             -antiplatelet therapy: N/A 3. Low back pain: kpad ordered. Continue Ultram or tylenol prn.  4. Mood: LCSW to follow for evaluation and support.              -antipsychotic agents: N/A 5. Neuropsych: This patient is not fully capable of making decisions on his own behalf. 6. Skin/Wound Care:  Air mattress overlay for DTI             --will add juven/vitamins to promote wound healing.  7. Fluids/Electrolytes/Nutrition: Strict I/O. Juven and Ensure max              --Recheck CMET in am 8. Chronic systolic/diastolic CHF: Monitor for signs of overload. Check daily weights.              --Back on Demadex, Entresto, Coreg and Pravacol. Resume Jardiance.              --  add Low salt/HH restrictions.  9. SSS/A fib s/p PPM/ICD: Monitor HR TID. Monitor for symptoms with increase in activity.  --HR controlled on Eliquis, coreg and amiodarone.   10. CKD: Baseline SCr-1.5 and renal status trending back to baseline with resumption of diuretics.              --continue to monitor with serial checks.   -5/23 Demadex held this am, Cr a little above baseline, follow BMP tomorrow  -5/24 Pt got 1.25 L fluids, hold Demadex again, recheck lab tomorrow  -5/25 Cr improved 1.5, Continue to hold demadex for now 11. Pre-diabetes/Hyperglycemia: Hgb A1C- 5.7. Jardiance on hold due to infection.  --Elevated BS likely due to steroids/illness.  --Will change Ensure to Ensure Max/Add CM restrictions             --Resume Jardiance in am and wean off insulin glargine.   -5/22 Decrease semglee to 8units  -5/24 Decrease semglee to 6 units 12. COPD: Respiratory status stable--to resume Dulera.  --Wean oxgyen as tolerated. Has been oxygen dependent at nights.              --encourage pulmonary hygiene.  13. BPH/Nephrolithiasis: Urinary retention at admission. Continue Flomax and Proscar.              --follow up with Dr. Thalia Bloodgood after discharge.  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  Morbid Obesity: Educate on wt loss/appropriate diet to promote overall health and mobility.  38. Screening for vitamin D deficiency: Vitamin D 54, 1,000U daily supplement started 17. History of hypermagnesemia: repeated 5/20 and is stable at 2.1 18. Hypertension: increase flomax to 0,8mg  and change to after supper. Improved, decrease Coreg to 3.125mg  BID.  -5/23 BP well controlled overall,  monitor for orthostatic hypotension  -5/24 Pt developed tachycardia and hypotension, rapid response, he was given 1.25 L fluids with improvement  -5/25 BP stable this am, cardiology following 19. Hypoalbuminemia: continue Juven.   -Continue juven and ensure max 20. Episode of confusion/hallucination  -Mental status appears to be at baseline currently  -5/23 Repeat labs this AM with normal WBC and Cr and BUN slightly above baseline, demadex held this am, and hold tomorrow until BMP complete  -5/24 mental status appears to be at baselines 21. Tachycardia  -5/24 Rapid response was called this AM for hypotension and tachycardia. EKG with Sinus Tachycardia. Improved after 1 L fluids. Cardiology consult was placed.  Continue gentle fluid repletion to avoid fluid overload.  Additional 244ml given. Strict I/O ordered.    -5/25- Cardiology has increased amiodarone for 2:1 atrial flutter     LOS: 6 days A FACE TO FACE EVALUATION WAS PERFORMED  Jennye Boroughs 09/25/2021, 7:43 AM

## 2021-09-25 NOTE — Discharge Summary (Signed)
Physician Discharge Summary  Patient ID: Blong Busk MRN: [id number] DOB/AGE: Apr 03, 1946 76 y.o.  Admit date: 09/19/2021 Discharge date: 09/25/2021  Discharge Diagnoses:  Principal Problem:   Hypotension Active Problems:   COPD (chronic obstructive pulmonary disease) (HCC)   Stage 3b chronic kidney disease (CKD) (HCC)   Type 2 diabetes mellitus with chronic kidney disease, without long-term current use of insulin (HCC)   Debility   A-fib (HCC)   Discharged Condition:  Stable    Significant Diagnostic Studies: DG CHEST PORT 1 VIEW  Result Date: 09/25/2021 CLINICAL DATA:  Hypertension EXAM: PORTABLE CHEST 1 VIEW COMPARISON:  Chest radiograph dated Sep 16, 2021 FINDINGS: The heart is enlarged. Atherosclerotic calcification of the aortic arch. Pacemaker leads in the right atrium and right ventricle, unchanged. Left basilar subsegmental linear atelectasis. Lungs are otherwise clear without evidence of focal consolidation or pleural effusion. No acute osseous abnormality. IMPRESSION: 1. Stable cardiomegaly. 2. Left basilar linear atelectasis, unchanged. No focal consolidation or pleural effusion. Electronically Signed   By: Keane Police D.O.   On: 09/25/2021 17:03    Labs:  Basic Metabolic Panel: Recent Labs  Lab 09/20/21 0506 09/23/21 0516 09/24/21 0548 09/25/21 0545  NA 146* 137 138 138  K 4.2 4.0 3.9 3.9  CL 108 99 102 102  CO2 33* 33* 29 29  GLUCOSE 109* 107* 120* 106*  BUN 49* 39* 49* 44*  CREATININE 1.47* 1.63* 1.71* 1.50*  CALCIUM 9.5 9.4 9.5 9.6  MG 2.1  --   --   --   PHOS  --   --   --   --     CBC: Recent Labs  Lab 09/20/21 0506 09/23/21 0516 09/25/21 1614  WBC 6.8 5.5 6.1  NEUTROABS 5.4  --   --   HGB 10.3* 10.3* 10.4*  HCT 32.9* 31.5* 32.0*  MCV 97.3 94.6 95.0  PLT 211 182 169     Brief HPI:   Kiyoto Slomski is a 76 y.o. male with history of CKD, COPD, CAD/cardiac arrhythmias s/p ICD/PPM, A-fib, seizure disorder, recent UTI was admitted on  08/21/2021 with fever, hematuria and chills due to urosepsis.  CT abdomen showed distended bladder with nonobstructing renal calculi and he was started on IV antibiotics.  Foley was placed for retention with recommendations for cystoscopy on outpatient basis.  Blood cultures were positive for Proteus and antibiotic coverage was broadened to cefazolin x14 days per Dr.,.  His hospital course was significant for decline in LOC requiring intubation on 05/02 as well as brief PEA arrest after intubation.  Chest x-ray showed concerns of aspiration/HCAP and antibiotics transitioned to ampicillin sulbactam with end date 5/10.    He developed hypotension due to septic shock requiring pressors as well as fluid boluses, acute on chronic renal failure due to ATN and was followed by nephrology briefly.  He tolerated extubation on 05/12, was started on dysphagia diet and stress dose steroids were being weaned off.  Delirium was resolving however he continued to be limited by delay in processing with fatigue, generalized weakness and poor balance with decreasing postural reaction.  Therapy was ongoing and CIR was recommended due to functional decline.   Hospital Course: Gerod Caligiuri was admitted to rehab 09/19/2021 for inpatient therapies to consist of PT, ST and OT at least three hours five days a week. Past admission physiatrist, therapy team and rehab RN have worked together to provide customized collaborative inpatient rehab. At admission, patient required max assist with mobility and total assist with ADL  tasks. He exhibited fluctuating bouts of lethargy which affected cognition.  He was maintained on dysphagia diet with supervision for safety.  Jardiance was resumed and insulin glargine was in process of being weaned off.  Pulmonary hygiene was encouraged and oxygen was weaned off.  Flomax was increased to 0.8 mg with timing of suppertime to prevent orthostatic symptoms.  Air mattress overlay was ordered for DTI and  protein supplements as well as vitamins were added to promote wound healing.  His blood sugars were monitored on ac/hs basis and were reasonably controlled.  Follow-up CBC shows H&H and white count to be stable  His p.o. intake had improved and lethargy had weaned off.  Tramadol was discontinued due to concerns of hallucination/confusion side effects.  Follow-up labs on 05/22 showed worsening of renal status with hypernatremia therefore Demadex was held x2 days with monitoring of weights and BMET daily.  He developed hypotension with SBP in 70s as well as wide-complex tachycardia with heart rate of 130 on 05/24. Dr. Marlou Porch was consulted for input and questioned a flutter and added loading dose amiodarone 400 mg twice daily.  Patient was treated with fluid boluses but he continued to have elevated heart rate with reports of malaise and recurrent hypotension.  Cardiology recommended DCCV which was scheduled for 05/26.  He was treated with additional 500 cc fluid bolus and PCCM was consulted to transfer patient to acute hospital for closer monitoring. He was discharged to ICU on 09/25/21    Current Medications:   [START ON 09/30/2021] amiodarone  200 mg Oral Daily   amiodarone  400 mg Oral BID   apixaban  5 mg Oral BID   vitamin C  500 mg Oral BID   carvedilol  3.125 mg Oral BID WC   cholecalciferol  1,000 Units Oral Daily   empagliflozin  10 mg Oral QAC breakfast   finasteride  2.5 mg Oral Daily   insulin aspart  0-15 Units Subcutaneous TID WC   insulin aspart  0-5 Units Subcutaneous QHS   insulin glargine-yfgn  6 Units Subcutaneous Daily   mouth rinse  15 mL Mouth Rinse BID   melatonin  3 mg Oral QHS   mometasone-formoterol  2 puff Inhalation BID   multivitamin  1 tablet Oral QHS   nutrition supplement (JUVEN)  1 packet Oral BID BM   pantoprazole  40 mg Oral Daily   PHENobarbital  64.8 mg Oral BID   polyethylene glycol  17 g Oral Daily   pravastatin  40 mg Oral QPM   tamsulosin  0.8 mg Oral  QPC supper   [START ON 09/27/2021] torsemide  20 mg Oral Daily   zinc sulfate  220 mg Oral Daily     Diet: Dysphagia 3, thin liquids-Carb modified/Heart healthy.   Disposition:  Acute hospital     Signed: Bary Leriche 09/26/2021, 6:40 PM

## 2021-09-25 NOTE — Significant Event (Signed)
Rapid Response Event Note   Reason for Call :  HR 130s  Initial Focused Assessment:  Patient is alert and oriented.  He is fatigued. Lung sounds decreased bases. Heart tones irregular He denies CP or shortness of breath  BP 79/63  AF 130  RR 20-22  O2 sat 100% on 2L [place]  Oral temp 98 CBG 108   Interventions:  500cc NS bolus CCM consulted for admission CCM team at bedside: Dr Perlie Mayo NP, Nicki Reaper NP  Plan of Care:  Admit to ICU Amiodarone gtt   Event Summary:   MD Notified: Algis Liming at bedside Call Time: [place] Time: 3491 End Time:  [place]  Raliegh Ip, RN

## 2021-09-25 NOTE — Progress Notes (Addendum)
Inpatient Rehabilitation Discharge Medication Review by a Pharmacist  A complete drug regimen review was completed for this patient to identify any potential clinically significant medication issues.  High Risk Drug Classes Is patient taking? Indication by Medication  Antipsychotic No   Anticoagulant Yes Lovenox- pAF  Antibiotic No   Opioid No   Antiplatelet No   Hypoglycemics/insulin Yes Insulin, Jardiance- T2DM  Vasoactive Medication Yes Amiodarone- arrhythmia, rate control, HFpEF, BPH  Chemotherapy No   Other Yes Melatonin- sleep Proscar- BPH Phenobarbital- seizure prophylaxis- disorder Pravachol- HLD Protonix- GERD     Type of Medication Issue Identified Description of Issue Recommendation(s)  Drug Interaction(s) (clinically significant)     Duplicate Therapy     Allergy     No Medication Administration End Date     Incorrect Dose     Additional Drug Therapy Needed     Significant med changes from prior encounter (inform family/care partners about these prior to discharge).    Other  PTA med: Zyrtec Voltaren gel Apresoline Prilosec Spironolactone Restart PTA meds when and if necessary during admission or at time of discharge, if warranted     Clinically significant medication issues were identified that warrant physician communication and completion of prescribed/recommended actions by midnight of the next day:  No  Time spent performing this drug regimen review (minutes):  30  Aviance Cooperwood BS, PharmD, BCPS Clinical Pharmacist 09/26/2021 9:59 AM  Contact: [phone] after 3 PM  "Be curious, not judgmental..." -Jamal Maes

## 2021-09-25 NOTE — Progress Notes (Signed)
Daily Progress Note   Patient Name: Ricky Hayden       Date: 09/25/2021 DOB: 16-Jan-1946  Age: 76 y.o. MRN#: [id number] Attending Physician: Jennye Boroughs, MD Primary Care Physician: [facility] Admit Date: 09/19/2021  Reason for Consultation/Follow-up: Establishing goals of care  Subjective: Medical records reviewed including progress notes, labs. Patient assessed at the bedside. He is working with OT, appears fatigued. No other concerns.  Created space and opportunity for patient's thoughts and feelings on his current illness, encouraging him to share what aspects of his care he would like to discuss further.  He states "I hope I can pull together." He tells me his recommended fluid intake was increased and that he worries this could make him worse. Provided reassurance and education on the balance if I&O to manage BP while being mindful of CHF/CKD. He also has questions regarding timing of "shock" to his heart and I shared the plan is for sooner rather than later, possibly tomorrow. He denies further questions and is agreeable to this PA calling his daughter [name] for ongoing support.  Called patient's daughter and provided updates on the above conversation. She confirms that he has been anxious about his fluid intake, as he has had a regular habit for years to maintain his health at home. She notes that his mental status is slowly improving, but still not completely back to his baseline.  She worries that he is receiving too much information from the team at once and not being forthright when he does not understand the care plan.  This is to preserve his dignity as much as he can.  We discussed recent events with rapid response hypotension, atrial flutter, and plans for  cardioversion.  Provided update that rapid response was called again today and he is currently being worked up for likely readmission to acute care. We discussed that he seems to have a setback right after he was finally starting to make progress.  Discussed the risks of further setbacks and complications - family is well aware throughout patient's medical history and multiple hospitalizations. Patient had a lot of pain after previous cardioversion and she worries this too will impact his ability to participate in his therapies.  Goal remains to stabilize and rehabilitate for hopefully return home with assistance from family for care.  We discussed  the benefit of more frequent palliative care visits to support the patient and review goals of care, care plan, and provide reassurance.  She is agreeable and notes she requests that we do NOT discuss decisions such as CODE STATUS without family present.  This makes the patient more anxious and altered.  Questions and concerns addressed. PMT will continue to support holistically.   Length of Stay: 6  Physical Exam Vitals and nursing note reviewed.  Constitutional:      General: He is not in acute distress.    Appearance: He is obese.     Interventions: Nasal cannula in place.  Cardiovascular:     Rate and Rhythm: Normal rate.  Pulmonary:     Effort: Pulmonary effort is normal.  Neurological:     Mental Status: He is alert. He is confused.     Motor: Weakness present.  Psychiatric:        Mood and Affect: Mood normal.        Behavior: Behavior is cooperative.            Vital Signs: BP (!) 79/63 (BP Location: Right Arm)   Pulse (!) 130   Temp 98 F (36.7 C) Comment: RN notified  Resp (!) 22   Ht 5\' 7"  (1.702 m)   Wt 131 kg   SpO2 98%   BMI 45.23 kg/m  SpO2: SpO2: 98 % O2 Device: O2 Device: Nasal Cannula O2 Flow Rate: O2 Flow Rate (L/min): 2 L/min      Palliative Assessment/Data:     Palliative Care Assessment & Plan   Patient  Profile: 76 y.o. 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   Patient was admitted for sepsis from UTI and required BiPAP in the ED. Patient was weaned off but then on 5/2 had decreased level of consciousness and increased oxygen requirement, placed back on BiPAP, became hypotensive and intubated for worsened mental status. Patient was extubated on 5/12. PMT has been consulted to assist with goals of care conversation.    Assessment: Goals of care conversation Functional deficits secondary to debility from septic shock PPM/ICD CKD COPD CHF Atrial flutter, unstable  Recommendations/Plan: Continue full code/full scope treatment  Patient will benefit from ongoing education and reassurance, family requests we do not discuss important medical decision-making without their presence given waxing and waning mental status  Psychosocial and emotional support provided PMT will continue to follow    Prognosis:  Unable to determine  Discharge Planning: To Be Determined  Care plan was discussed with patient, patient's daughter    Norberta Keens, PA-C  Palliative Medicine Team Team phone # [phone]  Thank you for allowing the Palliative Medicine Team to assist in the care of this patient. Please utilize secure chat with additional questions, if there is no response within 30 minutes please call the above phone number.  Palliative Medicine Team providers are available by phone from 7am to 7pm daily and can be reached through the team cell phone.  Should this patient require assistance outside of these hours, please call the patient's attending physician.

## 2021-09-25 NOTE — Progress Notes (Signed)
Occupational Therapy Session Note  Patient Details  Name: Ricky Hayden MRN: [id number] Date of Birth: 1946/02/06  Pt being transferred to acute therefore missed 45 min of skilled OT.   Lowella Dell Fady Stamps 09/25/2021, 6:54 AM

## 2021-09-25 NOTE — Progress Notes (Signed)
Patients family would like to speak with cardiologist. Also requested to have UA rechecked. Patients blood pressure has improved some, but heart rate remains tachycardic. Patient has been alert and oriented, skin warm and dry. Patient complained of right hip pain, prn tylenol 650 mg po given. Repositioned with pillows. Patients leg is warm to touch, has pulse, movement and sensation in the extremity. Patient c/o of dizziness that was relieved with position change. Patient  did sleep intermittently.

## 2021-09-25 NOTE — Progress Notes (Addendum)
Progress Note  Patient Name: Ricky Hayden Date of Encounter: 09/25/2021  [facility] Cardiologist: None   Subjective   Patient denies any chest pain, palpitations, dizziness. Complained of some mild SOB while working with PT.   Inpatient Medications    Scheduled Meds:  [START ON 09/30/2021] amiodarone  200 mg Oral Daily   amiodarone  400 mg Oral BID   apixaban  5 mg Oral BID   vitamin C  500 mg Oral BID   carvedilol  3.125 mg Oral BID WC   cholecalciferol  1,000 Units Oral Daily   empagliflozin  10 mg Oral QAC breakfast   finasteride  2.5 mg Oral Daily   insulin aspart  0-15 Units Subcutaneous TID WC   insulin aspart  0-5 Units Subcutaneous QHS   insulin glargine-yfgn  6 Units Subcutaneous Daily   mouth rinse  15 mL Mouth Rinse BID   melatonin  3 mg Oral QHS   mometasone-formoterol  2 puff Inhalation BID   multivitamin  1 tablet Oral QHS   nutrition supplement (JUVEN)  1 packet Oral BID BM   pantoprazole  40 mg Oral Daily   PHENobarbital  64.8 mg Oral BID   polyethylene glycol  17 g Oral Daily   pravastatin  40 mg Oral QPM   tamsulosin  0.8 mg Oral QPC supper   [START ON 09/27/2021] torsemide  20 mg Oral Daily   zinc sulfate  220 mg Oral Daily   Continuous Infusions:  PRN Meds: acetaminophen, albuterol, alum & mag hydroxide-simeth, bisacodyl, diphenhydrAMINE, guaiFENesin-dextromethorphan, lidocaine, polyethylene glycol, prochlorperazine **OR** prochlorperazine **OR** prochlorperazine, sodium phosphate, white petrolatum, Zinc Oxide   Vital Signs    Vitals:   09/25/21 0420 09/25/21 0500 09/25/21 0803 09/25/21 0927  BP: 96/72   100/70  Pulse: (!) 124   100  Resp: 18     Temp: 98.4 F (36.9 C)     TempSrc: Oral     SpO2: 99%  98%   Weight:  131 kg    Height:        Intake/Output Summary (Last 24 hours) at 09/25/2021 1013 Last data filed at 09/25/2021 5329 Gross per 24 hour  Intake 357 ml  Output 1600 ml  Net -1243 ml      09/25/2021    5:00 AM  09/22/2021    4:48 AM 09/21/2021    3:46 AM  Last 3 Weights  Weight (lbs) 288 lb 12.8 oz 295 lb 6.7 oz 284 lb 6.3 oz  Weight (kg) 131 kg 134 kg 129 kg      Telemetry    NA - Personally Reviewed  ECG    No new tracings - Personally Reviewed  Physical Exam   GEN: No acute distress.  Laying comfortably in the bed  Neck: No JVD Cardiac: Irregular rate and rhythm. Radial pulses 2+ bilaterally, irregular.  Respiratory: Faint crackles in bilateral lung bases  GI: Soft, nontender, non-distended  MS: No edema; No deformity. Neuro:  Nonfocal  Psych: Normal affect   Labs    High Sensitivity Troponin:   Recent Labs  Lab 08/28/21 2347 08/29/21 0652 09/03/21 0850  TROPONINIHS 41* 51* 35*     Chemistry Recent Labs  Lab 09/20/21 0506 09/23/21 0516 09/24/21 0548 09/25/21 0545  NA 146* 137 138 138  K 4.2 4.0 3.9 3.9  CL 108 99 102 102  CO2 33* 33* 29 29  GLUCOSE 109* 107* 120* 106*  BUN 49* 39* 49* 44*  CREATININE 1.47* 1.63* 1.71*  1.50*  CALCIUM 9.5 9.4 9.5 9.6  MG 2.1  --   --   --   PROT 7.7  --   --   --   ALBUMIN 2.6*  --   --   --   AST 24  --   --   --   ALT 51*  --   --   --   ALKPHOS 98  --   --   --   BILITOT 0.5  --   --   --   GFRNONAA 49* 43* 41* 48*  ANIONGAP 5 5 7 7     Lipids No results for input(s): CHOL, TRIG, HDL, LABVLDL, LDLCALC, CHOLHDL in the last 168 hours.  Hematology Recent Labs  Lab 09/20/21 0506 09/23/21 0516  WBC 6.8 5.5  RBC 3.38* 3.33*  HGB 10.3* 10.3*  HCT 32.9* 31.5*  MCV 97.3 94.6  MCH 30.5 30.9  MCHC 31.3 32.7  RDW 13.5 13.7  PLT 211 182   Thyroid No results for input(s): TSH, FREET4 in the last 168 hours.  BNPNo results for input(s): BNP, PROBNP in the last 168 hours.  DDimer No results for input(s): DDIMER in the last 168 hours.   Radiology    No results found.  Cardiac Studies   Echocardiogram 08/29/21   1. Left ventricular ejection fraction, by estimation, is 40 to 45%. The  left ventricle has mildly decreased  function. The left ventricle has no  regional wall motion abnormalities. There is mild left ventricular  hypertrophy. Left ventricular diastolic  parameters are consistent with Grade I diastolic dysfunction (impaired  relaxation).   2. Right ventricular systolic function is normal. The right ventricular  size is normal.   3. The mitral valve is normal in structure. No evidence of mitral valve  regurgitation. No evidence of mitral stenosis.   4. The aortic valve is normal in structure. There is moderate  calcification of the aortic valve. There is moderate thickening of the  aortic valve. Aortic valve regurgitation is not visualized. Aortic valve  sclerosis/calcification is present, without any   evidence of aortic stenosis.   5. Aortic dilatation noted. There is mild dilatation of the ascending  aorta, measuring 41 mm.   6. The inferior vena cava is normal in size with greater than 50%  respiratory variability, suggesting right atrial pressure of 3 mmHg.   Comparison(s): Prior images reviewed side by side. The left ventricular  function has improved.   Patient Profile     76 y.o. 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 Curlene Dolphin  Assessment & Plan    Tachycardia  Atrial Flutter  -Patient was admitted to [facility] in 04/2021 for elevated heart rate, palpitations.  Patient was initially thought to be in SVT, but later found to be in atrial flutter with a left bundle branch block present.  Patient was started on amiodarone, had TEE/DCCV on 12/3 that was unsuccessful.  Patient was diuresed and had a DCCV on 12/8 that was successful -Since then patient has been on amiodarone 200 mg daily and Eliquis 5  mg twice daily. Was maintaining sinus rhythm  -Around 4:30 AM yesterday, vital signs showed that patient had a heart rate around 130 bpm.  BP was low, patient  was given 1 L bolus normal saline with improvement of BP. HR remained elevated  -EKG showed a wide complex regular tachycardia (patient has a known LBBB), suspicious for 2:1 atrial flutter   - Patient not on telemetry, but from vital signs his HR went from 62 BPM to 133 Bpm. Pulse irregular on exam   - Increased amiodarone to 400 mg BID for 5 days to reload patient (has received 2 doses so far)  - HR 120s-130s today  - Continue carvedilol as BP allows  - If load of amiodarone does not convert patient, we could consider cardioversion. However, since patient is in rehab he would have to be readmitted to the hospital before the procedure     NICM  Chronic Systolic/Diastolic CHF  - Echo this admission showed EF 40-45% - Patient given IV fluids for hypotension, tachycardia. Monitor closely for signs of fluid overload  - Continue carvedilol as BP allows  - Continue jardiance  - OK to hold entresto in the setting of low BP, resume when able  - Agree with holding demadex in the setting of dehydration, elevated creatinine. Creatinine is improving off demadex, 1.50 today (down from 1.71). Plan to restart 5/27 if renal function allows    S/P Biventricular implantable cardioverter-defibrillator  - Normal device function on 05/15/21       For questions or updates, please contact [place] Please consult [email or web address] for contact info under        Signed, Margie Billet, PA-C  09/25/2021, 10:13 AM    Personally seen and examined. Agree with above.  BP still soft while in atrial flutter.  Holding [place] I would like to see if we can logistically bring him down to endoscopy tomorrow for a DC cardioversion. He has not missed any doses of Eliquis. He has gotten additional amiodarone dose 400mg  (set to BID).   Will reach out to Dr.  Curlene Dolphin.   Candee Furbish, MD

## 2021-09-25 NOTE — Progress Notes (Signed)
Speech Language Pathology Daily Session Note  Patient Details  Name: Ricky Hayden MRN: [id number] Date of Birth: 08/22/1945  Today's Date: 09/25/2021 SLP Individual Time: 0900-0930 SLP Individual Time Calculation (min): 30 min  Short Term Goals: Week 1: SLP Short Term Goal 1 (Week 1): Pt will attend to functional tasks for 8-10 minutes with mod A cues for support SLP Short Term Goal 2 (Week 1): Pt will recall functional information with mod A cues for compensatory strategies SLP Short Term Goal 3 (Week 1): Pt will increase orientation x4 with mod A cues for use of external aids SLP Short Term Goal 4 (Week 1): Pt will solve basic problems during functional tasks with mod A cues SLP Short Term Goal 5 (Week 1): Pt will tolerate Dys 3/thin diet with min A cues for use of compensatory swallow strategies  Skilled Therapeutic Interventions: Skilled treatment session focused on cognitive goals. Upon arrival, patient was awake while supine in bed. Patient had soaked throughout his brief and his linen. SLP facilitated session by providing extra time for initiation for bed mobility and for donning a new gown. Patient was repositioned in bed and appeared awake and alert. Patient was oriented X 4 with supervision level verbal and visual cues and able to recall recent medical events. Patient's breakfast arrived and SLP performed tray set-up. Patient consumed his breakfast meal of Dys. 3 textures with thin liquids without overt s/s of aspiration. Recommend patient continue current diet. Patient handed off to NT. Continue with current plan of care.        Pain No/Denies Pain   Therapy/Group: Individual Therapy  Keithon Mccoin 09/25/2021, 12:30 PM

## 2021-09-25 NOTE — Plan of Care (Signed)
Patient with atrial flutter; discharged to acute for cardioversion/tele monitoring

## 2021-09-26 ENCOUNTER — Encounter (HOSPITAL_COMMUNITY)
Admission: AD | Disposition: A | Discharge status: Rehabilitation Facility | Payer: Self-pay | Source: Intra-hospital | Attending: Internal Medicine

## 2021-09-26 ENCOUNTER — Encounter (HOSPITAL_COMMUNITY): Payer: Self-pay | Admitting: Internal Medicine

## 2021-09-26 ENCOUNTER — Inpatient Hospital Stay (HOSPITAL_COMMUNITY)
Admission: AD | Admit: 2021-09-26 | Discharge: 2021-09-26 | Disposition: A | Discharge status: Home / Self Care | Payer: No Typology Code available for payment source | Source: Ambulatory Visit | Attending: Cardiovascular Disease | Admitting: Cardiovascular Disease

## 2021-09-26 ENCOUNTER — Other Ambulatory Visit (HOSPITAL_COMMUNITY): Payer: Self-pay

## 2021-09-26 ENCOUNTER — Inpatient Hospital Stay (HOSPITAL_COMMUNITY): Payer: No Typology Code available for payment source | Admitting: Anesthesiology

## 2021-09-26 DIAGNOSIS — I4891 Unspecified atrial fibrillation: Secondary | ICD-10-CM

## 2021-09-26 DIAGNOSIS — I509 Heart failure, unspecified: Secondary | ICD-10-CM

## 2021-09-26 DIAGNOSIS — I110 Hypertensive heart disease with heart failure: Secondary | ICD-10-CM

## 2021-09-26 DIAGNOSIS — I2510 Atherosclerotic heart disease of native coronary artery without angina pectoris: Secondary | ICD-10-CM

## 2021-09-26 DIAGNOSIS — I480 Paroxysmal atrial fibrillation: Secondary | ICD-10-CM

## 2021-09-26 DIAGNOSIS — I4819 Other persistent atrial fibrillation: Secondary | ICD-10-CM

## 2021-09-26 DIAGNOSIS — I4892 Unspecified atrial flutter: Secondary | ICD-10-CM

## 2021-09-26 DIAGNOSIS — I959 Hypotension, unspecified: Secondary | ICD-10-CM

## 2021-09-26 HISTORY — PX: TEE WITHOUT CARDIOVERSION: SHX5443

## 2021-09-26 HISTORY — PX: CARDIOVERSION: SHX1299

## 2021-09-26 LAB — BASIC METABOLIC PANEL
Anion gap: 5 (ref 5–15)
BUN: 36 mg/dL — ABNORMAL HIGH (ref 8–23)
CO2: 29 mmol/L (ref 22–32)
Calcium: 9.3 mg/dL (ref 8.9–10.3)
Chloride: 102 mmol/L (ref 98–111)
Creatinine, Ser: 1.52 mg/dL — ABNORMAL HIGH (ref 0.61–1.24)
GFR, Estimated: 47 mL/min — ABNORMAL LOW (ref >60)
Glucose, Bld: 111 mg/dL — ABNORMAL HIGH (ref 70–99)
Potassium: 4.1 mmol/L (ref 3.5–5.1)
Sodium: 136 mmol/L (ref 135–145)

## 2021-09-26 LAB — CBC
HCT: 31.9 % — ABNORMAL LOW (ref 39.0–52.0)
Hemoglobin: 10.3 g/dL — ABNORMAL LOW (ref 13.0–17.0)
MCH: 30.8 pg (ref 26.0–34.0)
MCHC: 32.3 g/dL (ref 30.0–36.0)
MCV: 95.5 fL (ref 80.0–100.0)
Platelets: 177 10*3/uL (ref 150–400)
RBC: 3.34 MIL/uL — ABNORMAL LOW (ref 4.22–5.81)
RDW: 13.7 % (ref 11.5–15.5)
WBC: 5.4 10*3/uL (ref 4.0–10.5)
nRBC: 0 % (ref 0.0–0.2)

## 2021-09-26 LAB — MAGNESIUM: Magnesium: 2.1 mg/dL (ref 1.7–2.4)

## 2021-09-26 LAB — PHOSPHORUS: Phosphorus: 3.8 mg/dL (ref 2.5–4.6)

## 2021-09-26 LAB — GLUCOSE, CAPILLARY
Glucose-Capillary: 100 mg/dL — ABNORMAL HIGH (ref 70–99)
Glucose-Capillary: 91 mg/dL (ref 70–99)
Glucose-Capillary: 83 mg/dL (ref 70–99)
Glucose-Capillary: 122 mg/dL — ABNORMAL HIGH (ref 70–99)

## 2021-09-26 LAB — PROTIME-INR
INR: 1.3 — ABNORMAL HIGH (ref 0.8–1.2)
Prothrombin Time: 16.4 seconds — ABNORMAL HIGH (ref 11.4–15.2)

## 2021-09-26 SURGERY — CARDIOVERSION
Anesthesia: Monitor Anesthesia Care

## 2021-09-26 MED ORDER — CHLORHEXIDINE GLUCONATE CLOTH 2 % EX PADS
6.0000 | MEDICATED_PAD | Freq: Every day | CUTANEOUS | Status: DC
  Administered 2021-09-26 – 2021-09-29 (×4): 6 via TOPICAL

## 2021-09-26 MED ORDER — FENTANYL CITRATE (PF) 100 MCG/2ML IJ SOLN: 25.0000 ug | INTRAMUSCULAR | Status: DC | PRN

## 2021-09-26 MED ORDER — OXYCODONE HCL 5 MG/5ML PO SOLN: 5.0000 mg | Freq: Once | ORAL | Status: DC | PRN

## 2021-09-26 MED ORDER — PROPOFOL 10 MG/ML IV BOLUS
INTRAVENOUS | Status: DC | PRN
  Administered 2021-09-26: 10 mg via INTRAVENOUS

## 2021-09-26 MED ORDER — PROPOFOL 500 MG/50ML IV EMUL
INTRAVENOUS | Status: DC | PRN
  Administered 2021-09-26: 75 ug/kg/min via INTRAVENOUS

## 2021-09-26 MED ORDER — OXYCODONE HCL 5 MG PO TABS: 5.0000 mg | ORAL_TABLET | Freq: Once | ORAL | Status: DC | PRN

## 2021-09-26 MED ORDER — FUROSEMIDE 10 MG/ML IJ SOLN
40.0000 mg | Freq: Once | INTRAMUSCULAR | Status: AC
  Administered 2021-09-26: 40 mg via INTRAVENOUS
  Filled 2021-09-26: qty 4

## 2021-09-26 MED ORDER — ONDANSETRON HCL 4 MG/2ML IJ SOLN: 4.0000 mg | Freq: Four times a day (QID) | INTRAMUSCULAR | Status: DC | PRN

## 2021-09-26 MED ORDER — SODIUM CHLORIDE 0.9 % IV SOLN: INTRAVENOUS | Status: DC

## 2021-09-26 NOTE — Progress Notes (Signed)
Progress Note  Patient Name: Ricky Hayden Date of Encounter: 09/26/2021  [facility] Cardiologist: None   Subjective   Fairly comfortable in bed.  Daughter at bedside.  He has a Copy ICD  Inpatient Medications    Scheduled Meds:  Chlorhexidine Gluconate Cloth  6 each Topical Daily   cholecalciferol  1,000 Units Oral Daily   enoxaparin (LOVENOX) injection  130 mg Subcutaneous Q12H   finasteride  2.5 mg Oral Daily   furosemide  40 mg Intravenous Once   insulin aspart  0-15 Units Subcutaneous TID WC   insulin aspart  0-5 Units Subcutaneous QHS   insulin glargine-yfgn  6 Units Subcutaneous Daily   mouth rinse  15 mL Mouth Rinse BID   melatonin  3 mg Oral QHS   mometasone-formoterol  2 puff Inhalation BID   pantoprazole  40 mg Oral Daily   phenobarbital  64.8 mg Oral BID   polyethylene glycol  17 g Oral Daily   pravastatin  40 mg Oral QPM   Continuous Infusions:  sodium chloride Stopped (09/25/21 2154)   amiodarone 30 mg/hr (09/26/21 0400)   PRN Meds: acetaminophen, albuterol, bisacodyl, polyethylene glycol   Vital Signs    Vitals:   09/26/21 0300 09/26/21 0400 09/26/21 0600 09/26/21 0700  BP: (!) 123/99 (!) 141/120 (!) 123/106   Pulse:  82 (!) 107 (!) 113  Resp: (!) 22 16 (!) 21 (!) 24  Temp:    97.7 F (36.5 C)  TempSrc:    Axillary  SpO2: 100% 96% 96% 97%    Intake/Output Summary (Last 24 hours) at 09/26/2021 0844 Last data filed at 09/26/2021 0400 Gross per 24 hour  Intake 713.15 ml  Output 1425 ml  Net -711.85 ml      09/25/2021    5:00 AM 09/22/2021    4:48 AM 09/21/2021    3:46 AM  Last 3 Weights  Weight (lbs) 288 lb 12.8 oz 295 lb 6.7 oz 284 lb 6.3 oz  Weight (kg) 131 kg 134 kg 129 kg      Telemetry    Atrial flutter with occasional variable conduction.  Occasional paced beat for the most part remains tachycardic.- Personally Reviewed  ECG    Atrial flutter- Personally Reviewed  Physical Exam   GEN: No acute  distress.  Tired appearing Neck: No JVD Cardiac: Tachycardic mostly regular, no murmurs, rubs, or gallops.  Pacemaker Saint Jude Respiratory: Clear to auscultation bilaterally. GI: Soft, nontender, non-distended  MS: Minimal edema; No deformity. Neuro:  Nonfocal  Psych: Normal affect   Labs    High Sensitivity Troponin:   Recent Labs  Lab 08/28/21 2347 08/29/21 0652 09/03/21 0850  TROPONINIHS 41* 51* 35*     Chemistry Recent Labs  Lab 09/20/21 0506 09/23/21 0516 09/24/21 0548 09/25/21 0545 09/26/21 0052  NA 146*   < > 138 138 136  K 4.2   < > 3.9 3.9 4.1  CL 108   < > 102 102 102  CO2 33*   < > 29 29 29   GLUCOSE 109*   < > 120* 106* 111*  BUN 49*   < > 49* 44* 36*  CREATININE 1.47*   < > 1.71* 1.50* 1.52*  CALCIUM 9.5   < > 9.5 9.6 9.3  MG 2.1  --   --   --  2.1  PROT 7.7  --   --   --   --   ALBUMIN 2.6*  --   --   --   --  AST 24  --   --   --   --   ALT 51*  --   --   --   --   ALKPHOS 98  --   --   --   --   BILITOT 0.5  --   --   --   --   GFRNONAA 49*   < > 41* 48* 47*  ANIONGAP 5   < > 7 7 5    < > = values in this interval not displayed.    Lipids No results for input(s): CHOL, TRIG, HDL, LABVLDL, LDLCALC, CHOLHDL in the last 168 hours.  Hematology Recent Labs  Lab 09/23/21 0516 09/25/21 1614 09/26/21 0052  WBC 5.5 6.1 5.4  RBC 3.33* 3.37* 3.34*  HGB 10.3* 10.4* 10.3*  HCT 31.5* 32.0* 31.9*  MCV 94.6 95.0 95.5  MCH 30.9 30.9 30.8  MCHC 32.7 32.5 32.3  RDW 13.7 13.7 13.7  PLT 182 169 177   Thyroid No results for input(s): TSH, FREET4 in the last 168 hours.  BNPNo results for input(s): BNP, PROBNP in the last 168 hours.  DDimer No results for input(s): DDIMER in the last 168 hours.   Radiology    DG CHEST PORT 1 VIEW  Result Date: 09/25/2021 CLINICAL DATA:  Hypertension EXAM: PORTABLE CHEST 1 VIEW COMPARISON:  Chest radiograph dated Sep 16, 2021 FINDINGS: The heart is enlarged. Atherosclerotic calcification of the aortic arch. Pacemaker  leads in the right atrium and right ventricle, unchanged. Left basilar subsegmental linear atelectasis. Lungs are otherwise clear without evidence of focal consolidation or pleural effusion. No acute osseous abnormality. IMPRESSION: 1. Stable cardiomegaly. 2. Left basilar linear atelectasis, unchanged. No focal consolidation or pleural effusion. Electronically Signed   By: Keane Police D.O.   On: 09/25/2021 17:03    Cardiac Studies   Most recent echo EF 40 to 45% in April 2023.  Patient Profile     76 y.o. male with paroxysmal atrial flutter, Saint Jude pacemaker, transferred from rehab secondary to hypotension, tachycardia, placed into age for TEE cardioversion.  Assessment & Plan    Paroxysmal atrial flutter - 3 days ago increase his amiodarone from 200 mg once a day up to 400 mg twice a day in the hopes that this would chemically cardiovert him.  This has not been successful up to now.  He had a rapid response called yesterday when blood pressures were in the 46K systolic.  I was able to manually check his blood pressures yesterday and they were approximately 92 systolic.  Challenging to interpret in the setting of his rapid heart rate.  Nonetheless, he was transferred to the [facility] unit.  He is on today for TEE cardioversion.  Hopefully his atrial flutter will be able to be cardioverted without difficulty.  After, consider decreasing amiodarone back to 200 mg twice a day for the next 2 weeks and then back to 200 mg a day thereafter.  Chronic anticoagulation - Appreciate pharmacy team assistance.  He has been on phenobarbital.  This decreases the effectiveness of Eliquis.  Because of this, he has been switched off of Eliquis to Lovenox injections.  Apparently edoxaban can be utilized with phenobarbital as long as creatinine clearance is reasonable.  [facility] gets his medications at the [facility].  The pharmacy team is investigating whether or not this medication can be given to him as an exception.  If  not, he will need to be placed on warfarin.  Saint Jude biventricular ICD -  Occasional paced beats noted on telemetry.  Functioning normally.  Chronic systolic heart failure nonischemic cardiomyopathy - EF 40 to 45%. -Given his hypotension, IV fluids have been administered. -Entresto is on hold. -Demadex is on hold.  Spoke to daughter in room.  For questions or updates, please contact [place] Please consult [email or web address] for contact info under        Signed, Candee Furbish, MD  09/26/2021, 8:44 AM

## 2021-09-26 NOTE — Transfer of Care (Signed)
Immediate Anesthesia Transfer of Care Note  Patient: Ricky Hayden  Procedure(s) Performed: CARDIOVERSION TRANSESOPHAGEAL ECHOCARDIOGRAM (TEE)  Patient Location: Endoscopy Unit  Anesthesia Type:General  Level of Consciousness: drowsy and patient cooperative  Airway & Oxygen Therapy: Patient Spontanous Breathing  Post-op Assessment: Report given to RN and Post -op Vital signs reviewed and stable  Post vital signs: Reviewed and stable  Last Vitals:  Vitals Value Taken Time  BP 91/41 09/26/21 1319  Temp    Pulse 44 09/26/21 1321  Resp 22 09/26/21 1321  SpO2 93 % 09/26/21 1321  Vitals shown include unvalidated device data.  Last Pain:  Vitals:   09/26/21 1221  TempSrc: Temporal  PainSc: 0-No pain         Complications: No notable events documented.

## 2021-09-26 NOTE — Anesthesia Preprocedure Evaluation (Signed)
Anesthesia Evaluation  Patient identified by MRN, date of birth, ID band Patient awake    Reviewed: Allergy & Precautions, H&P , NPO status , Patient's Chart, lab work & pertinent test results  History of Anesthesia Complications (+) PONV and history of anesthetic complications  Airway Mallampati: II   Neck ROM: full    Dental   Pulmonary COPD, former smoker,    breath sounds clear to auscultation       Cardiovascular hypertension, + CAD and +CHF  + dysrhythmias Atrial Fibrillation  Rhythm:irregular Rate:Normal     Neuro/Psych Seizures -,   Neuromuscular disease    GI/Hepatic hiatal hernia, GERD  ,  Endo/Other  diabetes  Renal/GU Renal InsufficiencyRenal disease     Musculoskeletal  (+) Arthritis ,   Abdominal   Peds  Hematology   Anesthesia Other Findings   Reproductive/Obstetrics                             Anesthesia Physical Anesthesia Plan  ASA: 3  Anesthesia Plan: MAC   Post-op Pain Management:    Induction: Intravenous  PONV Risk Score and Plan: 2 and Propofol infusion and Treatment may vary due to age or medical condition  Airway Management Planned: Nasal Cannula  Additional Equipment:   Intra-op Plan:   Post-operative Plan:   Informed Consent: I have reviewed the patients History and Physical, chart, labs and discussed the procedure including the risks, benefits and alternatives for the proposed anesthesia with the patient or authorized representative who has indicated his/her understanding and acceptance.     Dental advisory given  Plan Discussed with: CRNA, Anesthesiologist and Surgeon  Anesthesia Plan Comments:         Anesthesia Quick Evaluation

## 2021-09-26 NOTE — Interval H&P Note (Signed)
History and Physical Interval Note:  09/26/2021 10:09 AM  Ricky Hayden  has presented today for surgery, with the diagnosis of afib.  The various methods of treatment have been discussed with the patient and family. After consideration of risks, benefits and other options for treatment, the patient has consented to  Procedure(s): CARDIOVERSION (N/A) TRANSESOPHAGEAL ECHOCARDIOGRAM (TEE) (N/A) as a surgical intervention.  The patient's history has been reviewed, patient examined, no change in status, stable for surgery.  I have reviewed the patient's chart and labs.  Questions were answered to the patient's satisfaction.     [name], MD

## 2021-09-26 NOTE — Anesthesia Procedure Notes (Signed)
Procedure Name: MAC Date/Time: 09/26/2021 12:50 PM Performed by: Moshe Salisbury, CRNA Pre-anesthesia Checklist: Patient identified, Emergency Drugs available, Suction available and Patient being monitored Oxygen Delivery Method: Simple face mask Intubation method: POM Mask used. Placement Confirmation: positive ETCO2 Dental Injury: Teeth and Oropharynx as per pre-operative assessment

## 2021-09-26 NOTE — Progress Notes (Signed)
Daily Progress Note   Patient Name: Ricky Hayden       Date: 09/26/2021 DOB: Sep 17, 1945  Age: 76 y.o. MRN#: [id number] Attending Physician: Jacky Kindle, MD Primary Care Physician: [facility] Admit Date: 09/25/2021  Reason for Consultation/Follow-up: Establishing goals of care  Subjective: Medical records reviewed including progress notes, labs. Patient assessed at the bedside.  His son Vicente Males, daughter-in-law, and granddaughter are present visiting.  He reports pain on his buttocks from sitting in the chair today.  He has no questions about his care at this time.  Patient is doing well after cardioversion today.  Discussed with son Vicente Males purpose of palliative visit for ongoing goals of care and follow-up after family meeting previously on Sunday.  Goals are clear for improvement and return home at this time, with further conversation regarding ventilation, tracheostomy and other anticipatory care planning as able throughout this hospitalization.  Son reports that patient's daughters are the primary contacts.  Questions and concerns addressed. PMT will continue to support holistically.   Length of Stay: 1  Physical Exam Vitals and nursing note reviewed.  Constitutional:      General: He is not in acute distress.    Appearance: He is obese.     Interventions: Nasal cannula in place.  Cardiovascular:     Rate and Rhythm: Normal rate.  Pulmonary:     Effort: Pulmonary effort is normal.  Neurological:     Mental Status: He is alert.     Motor: Weakness present.  Psychiatric:        Mood and Affect: Mood normal.        Behavior: Behavior is cooperative.            Vital Signs: BP 132/66 (BP Location: Right Arm)   Pulse 82   Temp 98.1 F (36.7 C) (Oral)   Resp 19    SpO2 93%  SpO2: SpO2: 93 % O2 Device: O2 Device: Room Air O2 Flow Rate: O2 Flow Rate (L/min): 2 L/min      Palliative Assessment/Data:     Palliative Care Assessment & Plan   Patient Profile: 76 y.o. 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  Patient was admitted for sepsis from UTI and required BiPAP in the ED. Patient was weaned off but then on 5/2 had decreased level of consciousness and increased oxygen requirement, placed back on BiPAP, became hypotensive and intubated for worsened mental status. Patient was extubated on 5/12. PMT has been consulted to assist with goals of care conversation.    Assessment: Goals of care conversation Functional deficits secondary to debility from septic shock PPM/ICD CKD COPD CHF Atrial flutter, unstable  Recommendations/Plan: Continue full code/full scope treatment  Ongoing patient education and reassurance Psychosocial and emotional support provided PMT will continue to follow    Prognosis:  Unable to determine  Discharge Planning: To Be Determined  Care plan was discussed with patient, patient's family, RN  Total time: I spent 25 minutes in the care of the patient today in the above activities and documenting the encounter.   Jeffren Dombek Johnnette Litter, PA-C  Palliative Medicine Team Team phone # [phone]  Thank you for allowing the Palliative Medicine Team to assist in the care of this patient. Please utilize secure chat with additional questions, if there is no response within 30 minutes please call the above phone number.  Palliative Medicine Team providers are available by phone from 7am to 7pm daily and can be reached through the team cell phone.  Should this patient require assistance outside of these hours, please call the patient's  attending physician.

## 2021-09-26 NOTE — CV Procedure (Signed)
Brief TEE Note  LVEF 35-40%.  Global hypokinesis No LA/LAA thrombus or masses  For additional details see full report.  Electrical Cardioversion Procedure Note Ricky Hayden [id number] 10/17/1945  Procedure: Electrical Cardioversion Indications:  Atrial Fibrillation  Procedure Details Consent: Risks of procedure as well as the alternatives and risks of each were explained to the (patient/caregiver).  Consent for procedure obtained. Time Out: Verified patient identification, verified procedure, site/side was marked, verified correct patient position, special equipment/implants available, medications/allergies/relevent history reviewed, required imaging and test results available.  Performed  Patient placed on cardiac monitor, pulse oximetry, supplemental oxygen as necessary.  Sedation given:  propofol Pacer pads placed anterior and posterior chest.  Cardioverted 1 time(s).  Cardioverted at [place].  Evaluation Findings: Post procedure EKG shows: AS BiV paced Complications: None Patient did tolerate procedure well.   Ricky Latch, MD 09/26/2021, 1:13 PM

## 2021-09-26 NOTE — Progress Notes (Signed)
Progress Note  Patient Name: Ricky Hayden Date of Encounter: 09/27/2021  [facility] Cardiologist: None   Subjective   Feels okay this morning. States he did not like his rehab. Breathing is unchanged.   Remains in NSR with PACs BP soft Cr 1.52>1.38  Inpatient Medications    Scheduled Meds:  Chlorhexidine Gluconate Cloth  6 each Topical Daily   cholecalciferol  1,000 Units Oral Daily   enoxaparin (LOVENOX) injection  130 mg Subcutaneous Q12H   finasteride  2.5 mg Oral Daily   insulin aspart  0-15 Units Subcutaneous TID WC   insulin aspart  0-5 Units Subcutaneous QHS   insulin glargine-yfgn  6 Units Subcutaneous Daily   mouth rinse  15 mL Mouth Rinse BID   melatonin  3 mg Oral QHS   mometasone-formoterol  2 puff Inhalation BID   pantoprazole  40 mg Oral Daily   phenobarbital  64.8 mg Oral BID   polyethylene glycol  17 g Oral Daily   pravastatin  40 mg Oral QPM   Continuous Infusions:  amiodarone 30 mg/hr (09/27/21 0432)   PRN Meds: acetaminophen, albuterol, bisacodyl, polyethylene glycol   Vital Signs    Vitals:   09/27/21 0300 09/27/21 0400 09/27/21 0500 09/27/21 0600  BP: 113/72 (!) 92/55 (!) 87/55 101/61  Pulse: 78 70 72 77  Resp: (!) 21 20 19 19   Temp:      TempSrc:      SpO2: 93% 91% 98% 97%  Weight:   134 kg     Intake/Output Summary (Last 24 hours) at 09/27/2021 0701 Last data filed at 09/27/2021 0300 Gross per 24 hour  Intake 574.45 ml  Output 1450 ml  Net -875.55 ml      09/27/2021    5:00 AM 09/25/2021    5:00 AM 09/22/2021    4:48 AM  Last 3 Weights  Weight (lbs) 295 lb 6.7 oz 288 lb 12.8 oz 295 lb 6.7 oz  Weight (kg) 134 kg 131 kg 134 kg      Telemetry    A-sensed V-paced and AV-pacing, occasional SVE- Personally Reviewed  ECG    A-sensed, v-paced - Personally Reviewed  Physical Exam   GEN: Sitting up in bed, comfortable  Neck: No JVD Cardiac: Irregular, no murmurs Respiratory: Scattered expiratory wheezing GI: Soft,  nontender, non-distended  MS: No edema; No deformity. Neuro:  Nonfocal  Psych: Normal affect   Labs    High Sensitivity Troponin:   Recent Labs  Lab 08/28/21 2347 08/29/21 0652 09/03/21 0850  TROPONINIHS 41* 51* 35*     Chemistry Recent Labs  Lab 09/25/21 0545 09/26/21 0052 09/27/21 0132  NA 138 136 140  K 3.9 4.1 4.2  CL 102 102 103  CO2 29 29 31   GLUCOSE 106* 111* 88  BUN 44* 36* 26*  CREATININE 1.50* 1.52* 1.38*  CALCIUM 9.6 9.3 9.1  MG  --  2.1 2.0  GFRNONAA 48* 47* 53*  ANIONGAP 7 5 6     Lipids No results for input(s): CHOL, TRIG, HDL, LABVLDL, LDLCALC, CHOLHDL in the last 168 hours.  Hematology Recent Labs  Lab 09/25/21 1614 09/26/21 0052 09/27/21 0132  WBC 6.1 5.4 5.0  RBC 3.37* 3.34* 3.03*  HGB 10.4* 10.3* 9.3*  HCT 32.0* 31.9* 29.2*  MCV 95.0 95.5 96.4  MCH 30.9 30.8 30.7  MCHC 32.5 32.3 31.8  RDW 13.7 13.7 13.9  PLT 169 177 147*   Thyroid No results for input(s): TSH, FREET4 in the last 168 hours.  BNPNo results for input(s): BNP, PROBNP in the last 168 hours.  DDimer No results for input(s): DDIMER in the last 168 hours.   Radiology    DG CHEST PORT 1 VIEW  Result Date: 09/25/2021 CLINICAL DATA:  Hypertension EXAM: PORTABLE CHEST 1 VIEW COMPARISON:  Chest radiograph dated Sep 16, 2021 FINDINGS: The heart is enlarged. Atherosclerotic calcification of the aortic arch. Pacemaker leads in the right atrium and right ventricle, unchanged. Left basilar subsegmental linear atelectasis. Lungs are otherwise clear without evidence of focal consolidation or pleural effusion. No acute osseous abnormality. IMPRESSION: 1. Stable cardiomegaly. 2. Left basilar linear atelectasis, unchanged. No focal consolidation or pleural effusion. Electronically Signed   By: Keane Police D.O.   On: 09/25/2021 17:03   ECHO TEE  Result Date: 09/26/2021    TRANSESOPHOGEAL ECHO REPORT   Patient Name:   Ricky Hayden Date of Exam: 09/26/2021 Medical Rec #:  [id number]     Height:        67.0 in Accession #:    [id number]    Weight:       288.8 lb Date of Birth:  06/13/1945    BSA:          2.364 m Patient Age:    76 years      BP:           84/60 mmHg Patient Gender: M             HR:           124 bpm. Exam Location:  Inpatient Procedure: Transesophageal Echo and Color Doppler Indications:     A-flutter  History:         Patient has prior history of Echocardiogram examinations, most                  recent 08/29/2021. CHF, Defibrillator; Arrythmias:Atrial                  Flutter. NICM.  Sonographer:     Eartha Inch Sonographer#2:   Clayton Lefort RDCS (AE) Referring Phys:  [id number] [facility] Diagnosing Phys: [name] MD PROCEDURE: After discussion of the risks and benefits of a TEE, an informed consent was obtained from the patient. The transesophogeal probe was passed without difficulty through the esophogus of the patient. Sedation performed by different physician. The patient was monitored while under deep sedation. Anesthestetic sedation was provided intravenously by Anesthesiology: 145.59mg  of Propofol. Image quality was good. The patient's vital signs; including heart rate, blood pressure, and oxygen saturation; remained stable throughout the procedure. The patient developed no complications during the procedure. A successful direct current cardioversion was performed at 200 joules with 1 attempt. IMPRESSIONS  1. Left ventricular ejection fraction, by estimation, is 30 to 35%. The left ventricle has moderately decreased function. The left ventricle demonstrates global hypokinesis.  2. Right ventricular systolic function is normal. The right ventricular size is normal.  3. No left atrial/left atrial appendage thrombus was detected.  4. The mitral valve is normal in structure. Trivial mitral valve regurgitation. No evidence of mitral stenosis.  5. The aortic valve is tricuspid. Aortic valve regurgitation is trivial. No aortic stenosis is present.  6. The inferior vena cava  is normal in size with greater than 50% respiratory variability, suggesting right atrial pressure of 3 mmHg. Conclusion(s)/Recommendation(s): Normal biventricular function without evidence of hemodynamically significant valvular heart disease. FINDINGS  Left Ventricle: Left ventricular ejection fraction, by estimation, is 30 to 35%. The left ventricle has  moderately decreased function. The left ventricle demonstrates global hypokinesis. The left ventricular internal cavity size was normal in size. There is no left ventricular hypertrophy. Right Ventricle: The right ventricular size is normal. No increase in right ventricular wall thickness. Right ventricular systolic function is normal. Left Atrium: Left atrial size was normal in size. No left atrial/left atrial appendage thrombus was detected. Right Atrium: Right atrial size was normal in size. Pericardium: There is no evidence of pericardial effusion. Mitral Valve: The mitral valve is normal in structure. Trivial mitral valve regurgitation. No evidence of mitral valve stenosis. Tricuspid Valve: The tricuspid valve is normal in structure. Tricuspid valve regurgitation is not demonstrated. No evidence of tricuspid stenosis. Aortic Valve: The aortic valve is tricuspid. Aortic valve regurgitation is trivial. No aortic stenosis is present. Pulmonic Valve: The pulmonic valve was normal in structure. Pulmonic valve regurgitation is trivial. No evidence of pulmonic stenosis. Aorta: The aortic root is normal in size and structure. Venous: The inferior vena cava is normal in size with greater than 50% respiratory variability, suggesting right atrial pressure of 3 mmHg. IAS/Shunts: No atrial level shunt detected by color flow Doppler. [name] MD Electronically signed by [name] MD Signature Date/Time: 09/26/2021/3:42:25 PM    Final     Cardiac Studies   TEE 09/26/21: IMPRESSIONS     1. Left ventricular ejection fraction, by estimation, is 30 to 35%.  The  left ventricle has moderately decreased function. The left ventricle  demonstrates global hypokinesis.   2. Right ventricular systolic function is normal. The right ventricular  size is normal.   3. No left atrial/left atrial appendage thrombus was detected.   4. The mitral valve is normal in structure. Trivial mitral valve  regurgitation. No evidence of mitral stenosis.   5. The aortic valve is tricuspid. Aortic valve regurgitation is trivial.  No aortic stenosis is present.   6. The inferior vena cava is normal in size with greater than 50%  respiratory variability, suggesting right atrial pressure of 3 mmHg.   Conclusion(s)/Recommendation(s): Normal biventricular function without  evidence of hemodynamically significant valvular heart disease.   Patient Profile     76 y.o. 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 Course complicated by acute hypoxic respiratory failure s/p intubation with PEA arrest following intubation and Aflutter with RVR for which Cardiology was consulted.   Assessment & Plan    #Aflutter/Afib with RVR: Patients course was complicated by Aflutter with RVR with associated hypotension. Placed on amiodarone and ultimately underwent TEE/DCCV on 09/26/21. Was previously on apixaban which was changed due to lovenox as phenobarbital decreases effectiveness of apixaban.  -Change to amiodarone 400mg  BID x7 days, 200mg  BID x 7days and then 200mg  daily thereafter -Continue therapeutic lovenox -Attempting to try to call [place] about Edoxaban  #Chronic Systolic Heart Failure: #S/p BiV [employer] ICD: TTE with LVEF 40-45% on TTE, but dropped to 30-35% on TEE 5/26. Likely worsened in the setting of Aflutter with RVR as well as  sepsis. GDMT held due to hypotension.  -BP too soft to add GDMT now; will add as able  #Chronic Hypoxic Respiratory Failure: #COPD on Home O2: -On 2L [place] chronically    For questions or updates, please contact [place] Please consult [email or web address] for contact info under  Signed, Freada Bergeron, MD  09/27/2021, 7:01 AM

## 2021-09-26 NOTE — Progress Notes (Signed)
NAME:  Ricky Hayden, MRN:  [id number], DOB:  06/01/1945, LOS: 1 ADMISSION DATE:  09/25/2021, CONSULTATION DATE:  09/25/2021 REFERRING MD:  Dr. Curlene Dolphin, CHIEF COMPLAINT:  Hypotension    History of Present Illness:  Ricky Hayden is a 76yo male with a PMH of AAA, cardiomyopathy, CHF, COPD, CKD, CAD, GERD, HTN, HLD, obesity, and arthritis who initially presented to [facility] ED 4/27 for complaints of SOB with hypoxia. Of note patient was seen by PCP 3 weeks prior for hematuria and was found to have proteus mirabilis bacteruria for which he was started on Bactrim.   On ED arrival patient was seen with hypoxic for which BIPAP was started, he was also seen febrile with t-max 103. UA remains concerning for active UTI. He was admitted per [facility] for sepsis workup.   Pertinent timeline for pervious admit below  4/27: [facility] 2/2 UTI, started ceftriaxone and azithro 4/27-5/1 on ceftriaxone 5/2:  antibiotics deescalated to cefazolin. PCCM consult for hypoxia. Placed on BIPAP 5/3: Bipap all night, worsening mental status and hypoxemia, intubated, short PEA arrest after intubation, hypotensive, central line placed, CXR in AM clear, CXR after intuabntion diffuse R sided infiltrates concerning for aspiration vs HAP 5/4 stable pressors, o2 weaned, Cr elevated, Uop diminished but not oliguric,  some flexure posturing, EEG ordered - negative 5/5 pressor off, minimal vent settings, seems to follow commands, UOP improved, nephrology consult at family request 5/7 patient received 80 mg IV Lasix overnight due to increased CVP and crackles to bases 5/12 Extubated, ID signed off  5/19 Discharged to CIR  Mid morning of 5/25 patent was seen with rhythm change (unstable A--fib) with subsequent hypotension for which PCCM was consulted to assist in management   Pertinent  Medical History  HFrEF due to cardiomyopathy COPD CKD 3a GERD BPH Gout HTN Remote history of brain surgery, on chronic  phenobarb to prevent seizure but no history of seizures per daughter meningitis    Significant Hospital Events: Including procedures, antibiotic start and stop dates in addition to other pertinent events   5/25 Seen with rhythm change (unstable A--fib) with subsequent hypotension for which PCCM was consulted to assist in management   Interim History / Subjective:  Patient remained in A-fib, heart rate is not well controlled On amiodarone infusion Back to his baseline home oxygen therapy Blood pressure is fluctuating, not on vasopressors  Objective   Blood pressure (!) 76/60, pulse (!) 127, temperature 97.7 F (36.5 C), temperature source Axillary, resp. rate 20, SpO2 96 %.        Intake/Output Summary (Last 24 hours) at 09/26/2021 1026 Last data filed at 09/26/2021 1000 Gross per 24 hour  Intake 812.12 ml  Output 1425 ml  Net -612.88 ml    There were no vitals filed for this visit.   Examination: Physical exam: General: Acute on chronically ill-appearing obese male, lying on the bed HEENT: [place]/AT, eyes anicteric.  moist mucus membranes Neuro: Alert, awake following commands Chest: Fine crackles at the bases bilaterally, no wheezes or rhonchi Heart: Irregularly irregular, tachycardic, no murmurs or gallops Abdomen: Soft, nontender, nondistended, bowel sounds present Skin: No rash   Labs: Creat 1.5, BUN 36 CBC WBC 5.4, HGB 10.3  Resolved Hospital Problem list     Assessment & Plan:  Atrial flutter/Afib with RVR Hypotension in the setting of A-fib with RVR, improved  Patient heart rate remain uncontrolled Continue IV amiodarone infusion Appreciate cardiology input, patient is going for TEE/cardioversion today Continue anticoagulation  with therapeutic Lovenox for now We will switch back to oral anticoagulation after cardioversion for stroke prophylaxis considering high CHA2DS2-VASc score Did not require vasopressors  Chronic Systolic/diastolic CHF s/p AICD ECHO  this admission EF 40-45% Hold carvedilol , entresto jardiance, torsemide for now Monitor intake and output  Chronic hypoxic respiratory failure due to COPD on home oxygen On 2L [place], which is baseline Continue dulera Goal O2 sat 88-92%  CKD stage IIIa BPH/bilateral nephrolithiasis Hold flowmax due to intermittent hypotension Continue proscar Avoid neprotoxic drugs as possible. Strict intake and output Serum creatinine is at baseline  Seizure disorder Patient on phenobarb since 1995 and seizure free per chart. Continue home phenobarbitol 64.8MG  BID  Morbid obesity Dietitian following  Best Practice (right click and "Reselect all SmartList Selections" daily)   Diet/type: NPO w/ oral meds DVT prophylaxis: Therapeutic Lovenox GI prophylaxis: PPI Lines: N/A Foley:  N/A Code Status:  full code Last date of multidisciplinary goals of care discussion [Pending]  Labs   CBC: Recent Labs  Lab 09/20/21 0506 09/23/21 0516 09/25/21 1614 09/26/21 0052  WBC 6.8 5.5 6.1 5.4  NEUTROABS 5.4  --   --   --   HGB 10.3* 10.3* 10.4* 10.3*  HCT 32.9* 31.5* 32.0* 31.9*  MCV 97.3 94.6 95.0 95.5  PLT 211 182 169 109    Basic Metabolic Panel: Recent Labs  Lab 09/20/21 0506 09/23/21 0516 09/24/21 0548 09/25/21 0545 09/26/21 0052  NA 146* 137 138 138 136  K 4.2 4.0 3.9 3.9 4.1  CL 108 99 102 102 102  CO2 33* 33* 29 29 29   GLUCOSE 109* 107* 120* 106* 111*  BUN 49* 39* 49* 44* 36*  CREATININE 1.47* 1.63* 1.71* 1.50* 1.52*  CALCIUM 9.5 9.4 9.5 9.6 9.3  MG 2.1  --   --   --  2.1  PHOS  --   --   --   --  3.8   GFR: Estimated Creatinine Clearance: 53 mL/min (A) (by C-G formula based on SCr of 1.52 mg/dL (H)). Recent Labs  Lab 09/20/21 0506 09/23/21 0516 09/25/21 1614 09/26/21 0052  WBC 6.8 5.5 6.1 5.4    Liver Function Tests: Recent Labs  Lab 09/20/21 0506  AST 24  ALT 51*  ALKPHOS 98  BILITOT 0.5  PROT 7.7  ALBUMIN 2.6*   No results for input(s): LIPASE, AMYLASE  in the last 168 hours. No results for input(s): AMMONIA in the last 168 hours.  ABG    Component Value Date/Time   PHART 7.392 09/08/2021 0830   PCO2ART 50.2 (H) 09/08/2021 0830   PO2ART 62 (L) 09/08/2021 0830   HCO3 30.6 (H) 09/08/2021 0830   TCO2 32 09/08/2021 0830   O2SAT 71.5 09/09/2021 1050     Coagulation Profile: Recent Labs  Lab 09/26/21 0052  INR 1.3*    Cardiac Enzymes: No results for input(s): CKTOTAL, CKMB, CKMBINDEX, TROPONINI in the last 168 hours.  HbA1C: Hemoglobin A1C  Date/Time Value Ref Range Status  10/05/2012 03:13 PM 5.4  Final   Hgb A1c MFr Bld  Date/Time Value Ref Range Status  09/02/2021 06:22 PM 5.7 (H) 4.8 - 5.6 % Final    Comment:    (NOTE) Pre diabetes:          5.7%-6.4%  Diabetes:              >6.4%  Glycemic control for   <7.0% adults with diabetes   02/06/2008 11:25 PM   Final   5.4 (NOTE)  The ADA recommends the following therapeutic goal for glycemic   control related to Hgb A1C measurement:   Goal of Therapy:   < 7.0% Hgb A1C   Reference: American Diabetes Association: Clinical Practice   Recommendations 2008, Diabetes Care,  2008, 31:(Suppl 1).    CBG: Recent Labs  Lab 09/25/21 0556 09/25/21 1206 09/25/21 1630 09/25/21 2121 09/26/21 0701  GLUCAP 107* 108* 75 123* 100*    Critical care time:     Total critical care time: 35 minutes  Performed by: [place] care time was exclusive of separately billable procedures and treating other patients.   Critical care was necessary to treat or prevent imminent or life-threatening deterioration.   Critical care was time spent personally by me on the following activities: development of treatment plan with patient and/or surrogate as well as nursing, discussions with consultants, evaluation of patient's response to treatment, examination of patient, obtaining history from patient or surrogate, ordering and performing treatments and interventions, ordering and  review of laboratory studies, ordering and review of radiographic studies, pulse oximetry and re-evaluation of patient's condition.   Jacky Kindle MD [facility] See Amion for pager If no response to pager, please call [phone] until 7pm After 7pm, Please call E-link [phone]

## 2021-09-26 NOTE — H&P (View-Only) (Signed)
Progress Note  Patient Name: Ricky Hayden Date of Encounter: 09/26/2021  [facility] Cardiologist: None   Subjective   Fairly comfortable in bed.  Daughter at bedside.  He has a Copy ICD  Inpatient Medications    Scheduled Meds:  Chlorhexidine Gluconate Cloth  6 each Topical Daily   cholecalciferol  1,000 Units Oral Daily   enoxaparin (LOVENOX) injection  130 mg Subcutaneous Q12H   finasteride  2.5 mg Oral Daily   furosemide  40 mg Intravenous Once   insulin aspart  0-15 Units Subcutaneous TID WC   insulin aspart  0-5 Units Subcutaneous QHS   insulin glargine-yfgn  6 Units Subcutaneous Daily   mouth rinse  15 mL Mouth Rinse BID   melatonin  3 mg Oral QHS   mometasone-formoterol  2 puff Inhalation BID   pantoprazole  40 mg Oral Daily   phenobarbital  64.8 mg Oral BID   polyethylene glycol  17 g Oral Daily   pravastatin  40 mg Oral QPM   Continuous Infusions:  sodium chloride Stopped (09/25/21 2154)   amiodarone 30 mg/hr (09/26/21 0400)   PRN Meds: acetaminophen, albuterol, bisacodyl, polyethylene glycol   Vital Signs    Vitals:   09/26/21 0300 09/26/21 0400 09/26/21 0600 09/26/21 0700  BP: (!) 123/99 (!) 141/120 (!) 123/106   Pulse:  82 (!) 107 (!) 113  Resp: (!) 22 16 (!) 21 (!) 24  Temp:    97.7 F (36.5 C)  TempSrc:    Axillary  SpO2: 100% 96% 96% 97%    Intake/Output Summary (Last 24 hours) at 09/26/2021 0844 Last data filed at 09/26/2021 0400 Gross per 24 hour  Intake 713.15 ml  Output 1425 ml  Net -711.85 ml      09/25/2021    5:00 AM 09/22/2021    4:48 AM 09/21/2021    3:46 AM  Last 3 Weights  Weight (lbs) 288 lb 12.8 oz 295 lb 6.7 oz 284 lb 6.3 oz  Weight (kg) 131 kg 134 kg 129 kg      Telemetry    Atrial flutter with occasional variable conduction.  Occasional paced beat for the most part remains tachycardic.- Personally Reviewed  ECG    Atrial flutter- Personally Reviewed  Physical Exam   GEN: No acute  distress.  Tired appearing Neck: No JVD Cardiac: Tachycardic mostly regular, no murmurs, rubs, or gallops.  Pacemaker Saint Jude Respiratory: Clear to auscultation bilaterally. GI: Soft, nontender, non-distended  MS: Minimal edema; No deformity. Neuro:  Nonfocal  Psych: Normal affect   Labs    High Sensitivity Troponin:   Recent Labs  Lab 08/28/21 2347 08/29/21 0652 09/03/21 0850  TROPONINIHS 41* 51* 35*     Chemistry Recent Labs  Lab 09/20/21 0506 09/23/21 0516 09/24/21 0548 09/25/21 0545 09/26/21 0052  NA 146*   < > 138 138 136  K 4.2   < > 3.9 3.9 4.1  CL 108   < > 102 102 102  CO2 33*   < > 29 29 29   GLUCOSE 109*   < > 120* 106* 111*  BUN 49*   < > 49* 44* 36*  CREATININE 1.47*   < > 1.71* 1.50* 1.52*  CALCIUM 9.5   < > 9.5 9.6 9.3  MG 2.1  --   --   --  2.1  PROT 7.7  --   --   --   --   ALBUMIN 2.6*  --   --   --   --  AST 24  --   --   --   --   ALT 51*  --   --   --   --   ALKPHOS 98  --   --   --   --   BILITOT 0.5  --   --   --   --   GFRNONAA 49*   < > 41* 48* 47*  ANIONGAP 5   < > 7 7 5    < > = values in this interval not displayed.    Lipids No results for input(s): CHOL, TRIG, HDL, LABVLDL, LDLCALC, CHOLHDL in the last 168 hours.  Hematology Recent Labs  Lab 09/23/21 0516 09/25/21 1614 09/26/21 0052  WBC 5.5 6.1 5.4  RBC 3.33* 3.37* 3.34*  HGB 10.3* 10.4* 10.3*  HCT 31.5* 32.0* 31.9*  MCV 94.6 95.0 95.5  MCH 30.9 30.9 30.8  MCHC 32.7 32.5 32.3  RDW 13.7 13.7 13.7  PLT 182 169 177   Thyroid No results for input(s): TSH, FREET4 in the last 168 hours.  BNPNo results for input(s): BNP, PROBNP in the last 168 hours.  DDimer No results for input(s): DDIMER in the last 168 hours.   Radiology    DG CHEST PORT 1 VIEW  Result Date: 09/25/2021 CLINICAL DATA:  Hypertension EXAM: PORTABLE CHEST 1 VIEW COMPARISON:  Chest radiograph dated Sep 16, 2021 FINDINGS: The heart is enlarged. Atherosclerotic calcification of the aortic arch. Pacemaker  leads in the right atrium and right ventricle, unchanged. Left basilar subsegmental linear atelectasis. Lungs are otherwise clear without evidence of focal consolidation or pleural effusion. No acute osseous abnormality. IMPRESSION: 1. Stable cardiomegaly. 2. Left basilar linear atelectasis, unchanged. No focal consolidation or pleural effusion. Electronically Signed   By: Keane Police D.O.   On: 09/25/2021 17:03    Cardiac Studies   Most recent echo EF 40 to 45% in April 2023.  Patient Profile     76 y.o. male with paroxysmal atrial flutter, Saint Jude pacemaker, transferred from rehab secondary to hypotension, tachycardia, placed into age for TEE cardioversion.  Assessment & Plan    Paroxysmal atrial flutter - 3 days ago increase his amiodarone from 200 mg once a day up to 400 mg twice a day in the hopes that this would chemically cardiovert him.  This has not been successful up to now.  He had a rapid response called yesterday when blood pressures were in the 70V systolic.  I was able to manually check his blood pressures yesterday and they were approximately 92 systolic.  Challenging to interpret in the setting of his rapid heart rate.  Nonetheless, he was transferred to the [facility] unit.  He is on today for TEE cardioversion.  Hopefully his atrial flutter will be able to be cardioverted without difficulty.  After, consider decreasing amiodarone back to 200 mg twice a day for the next 2 weeks and then back to 200 mg a day thereafter.  Chronic anticoagulation - Appreciate pharmacy team assistance.  He has been on phenobarbital.  This decreases the effectiveness of Eliquis.  Because of this, he has been switched off of Eliquis to Lovenox injections.  Apparently edoxaban can be utilized with phenobarbital as long as creatinine clearance is reasonable.  He gets his [facility] at the [facility].  The pharmacy team is investigating whether or not this medication can be given to him as an exception.  If  not, he will need to be placed on warfarin.  Saint Jude biventricular ICD -  Occasional paced beats noted on telemetry.  Functioning normally.  Chronic systolic heart failure nonischemic cardiomyopathy - EF 40 to 45%. -Given his hypotension, IV fluids have been administered. -Entresto is on hold. -Demadex is on hold.  Spoke to daughter in room.  For questions or updates, please contact [place] Please consult [email or web address] for contact info under        Signed, Candee Furbish, MD  09/26/2021, 8:44 AM

## 2021-09-26 NOTE — Progress Notes (Signed)
  Echocardiogram Echocardiogram Transesophageal has been performed.  Ricky Hayden 09/26/2021, 1:21 PM

## 2021-09-27 DIAGNOSIS — I4819 Other persistent atrial fibrillation: Secondary | ICD-10-CM | POA: Diagnosis not present

## 2021-09-27 DIAGNOSIS — I5022 Chronic systolic (congestive) heart failure: Secondary | ICD-10-CM | POA: Diagnosis not present

## 2021-09-27 LAB — CBC
HCT: 29.2 % — ABNORMAL LOW (ref 39.0–52.0)
Hemoglobin: 9.3 g/dL — ABNORMAL LOW (ref 13.0–17.0)
MCH: 30.7 pg (ref 26.0–34.0)
MCHC: 31.8 g/dL (ref 30.0–36.0)
MCV: 96.4 fL (ref 80.0–100.0)
Platelets: 147 10*3/uL — ABNORMAL LOW (ref 150–400)
RBC: 3.03 MIL/uL — ABNORMAL LOW (ref 4.22–5.81)
RDW: 13.9 % (ref 11.5–15.5)
WBC: 5 10*3/uL (ref 4.0–10.5)
nRBC: 0 % (ref 0.0–0.2)

## 2021-09-27 LAB — GLUCOSE, CAPILLARY
Glucose-Capillary: 98 mg/dL (ref 70–99)
Glucose-Capillary: 101 mg/dL — ABNORMAL HIGH (ref 70–99)
Glucose-Capillary: 75 mg/dL (ref 70–99)
Glucose-Capillary: 85 mg/dL (ref 70–99)

## 2021-09-27 LAB — BASIC METABOLIC PANEL
Anion gap: 6 (ref 5–15)
BUN: 26 mg/dL — ABNORMAL HIGH (ref 8–23)
CO2: 31 mmol/L (ref 22–32)
Calcium: 9.1 mg/dL (ref 8.9–10.3)
Chloride: 103 mmol/L (ref 98–111)
Creatinine, Ser: 1.38 mg/dL — ABNORMAL HIGH (ref 0.61–1.24)
GFR, Estimated: 53 mL/min — ABNORMAL LOW (ref >60)
Glucose, Bld: 88 mg/dL (ref 70–99)
Potassium: 4.2 mmol/L (ref 3.5–5.1)
Sodium: 140 mmol/L (ref 135–145)

## 2021-09-27 LAB — MAGNESIUM: Magnesium: 2 mg/dL (ref 1.7–2.4)

## 2021-09-27 MED ORDER — PREDNISONE 20 MG PO TABS
40.0000 mg | ORAL_TABLET | Freq: Every day | ORAL | Status: DC
  Administered 2021-09-28 – 2021-10-01 (×4): 40 mg via ORAL
  Filled 2021-09-27 (×4): qty 2

## 2021-09-27 MED ORDER — AMIODARONE HCL 200 MG PO TABS: 200.0000 mg | ORAL_TABLET | Freq: Two times a day (BID) | ORAL | Status: DC

## 2021-09-27 MED ORDER — UMECLIDINIUM BROMIDE 62.5 MCG/ACT IN AEPB
1.0000 | INHALATION_SPRAY | Freq: Every day | RESPIRATORY_TRACT | Status: DC
  Administered 2021-09-27 – 2021-10-01 (×4): 1 via RESPIRATORY_TRACT
  Filled 2021-09-27 (×2): qty 7

## 2021-09-27 MED ORDER — AMIODARONE HCL 200 MG PO TABS: 200.0000 mg | ORAL_TABLET | Freq: Every day | ORAL | Status: DC

## 2021-09-27 MED ORDER — AMIODARONE HCL 200 MG PO TABS
400.0000 mg | ORAL_TABLET | Freq: Two times a day (BID) | ORAL | Status: DC
  Administered 2021-09-27 – 2021-10-01 (×9): 400 mg via ORAL
  Filled 2021-09-27 (×9): qty 2

## 2021-09-27 MED ORDER — FLUTICASONE FUROATE-VILANTEROL 200-25 MCG/ACT IN AEPB
1.0000 | INHALATION_SPRAY | Freq: Every day | RESPIRATORY_TRACT | Status: DC
  Administered 2021-09-27 – 2021-10-01 (×4): 1 via RESPIRATORY_TRACT
  Filled 2021-09-27 (×2): qty 28

## 2021-09-27 NOTE — Progress Notes (Signed)
NAME:  Ricky Hayden, MRN:  [id number], DOB:  [birth year], LOS: 2 ADMISSION DATE:  09/25/2021, CONSULTATION DATE:  09/25/2021 REFERRING MD:  Dr. Curlene Dolphin, CHIEF COMPLAINT:  Hypotension    History of Present Illness:  Ricky Hayden is a 76yo male with a PMH of AAA, cardiomyopathy, CHF, COPD, CKD, CAD, GERD, HTN, HLD, obesity, and arthritis who initially presented to [facility] ED 4/27 for complaints of SOB with hypoxia. Of note patient was seen by PCP 3 weeks prior for hematuria and was found to have proteus mirabilis bacteruria for which he was started on Bactrim.   On ED arrival patient was seen with hypoxic for which BIPAP was started, he was also seen febrile with t-max 103. UA remains concerning for active UTI. He was admitted per [facility] for sepsis workup.   Pertinent timeline for pervious admit below  4/27: admitted to [facility] for sepsis 2/2 UTI, started ceftriaxone and azithro 4/27-5/1 on ceftriaxone 5/2:  antibiotics deescalated to cefazolin. PCCM consult for hypoxia. Placed on BIPAP 5/3: Bipap all night, worsening mental status and hypoxemia, intubated, short PEA arrest after intubation, hypotensive, central line placed, CXR in AM clear, CXR after intuabntion diffuse R sided infiltrates concerning for aspiration vs HAP 5/4 stable pressors, o2 weaned, Cr elevated, Uop diminished but not oliguric,  some flexure posturing, EEG ordered - negative 5/5 pressor off, minimal vent settings, seems to follow commands, UOP improved, nephrology consult at family request 5/7 patient received 80 mg IV Lasix overnight due to increased CVP and crackles to bases 5/12 Extubated, ID signed off  5/19 Discharged to CIR  Mid morning of 5/25 patent was seen with rhythm change (unstable A--fib) with subsequent hypotension for which PCCM was consulted to assist in management   Pertinent  Medical History  HFrEF due to cardiomyopathy COPD CKD 3a GERD BPH Gout HTN Remote history of brain surgery, on chronic  phenobarb to prevent seizure but no history of seizures per daughter meningitis    Significant Hospital Events: Including procedures, antibiotic start and stop dates in addition to other pertinent events   5/25 Seen with rhythm change (unstable A--fib) with subsequent hypotension for which PCCM was consulted to assist in management   Interim History / Subjective:  Now in sinus with PACs Grumpy. Family at bedside.  Objective   Blood pressure (!) 97/57, pulse 74, temperature 98.9 F (37.2 C), temperature source Oral, resp. rate (!) 23, weight 134 kg, SpO2 100 %.        Intake/Output Summary (Last 24 hours) at 09/27/2021 1057 Last data filed at 09/27/2021 0800 Gross per 24 hour  Intake 542.06 ml  Output 1750 ml  Net -1207.94 ml     Filed Weights   09/27/21 0500  Weight: 134 kg     Examination: No distress Heart sounds regular with occasional ectopic beats Abdomen soft Lung sounds wheezy  Resolved Hospital Problem list     Assessment & Plan:  Atrial flutter/Afib with RVR Hypotension in the setting of A-fib with RVR, improved  S/P cardioversion To transition to PO amiodarone today Continue lovenox, eventually will be switch to edoxaban if [facility] will approve  Chronic Systolic/diastolic CHF s/p AICD GDMT on hold given borderline pressures  Chronic hypoxic respiratory failure due to COPD on home oxygen- seems to be in flare Start triple therapy, steroids x 5 days  CKD stage IIIa BPH/bilateral nephrolithiasis Hold flowmax due to intermittent hypotension Continue proscar Avoid neprotoxic drugs as possible. Strict intake and output Serum creatinine is at baseline  Seizure disorder Patient on phenobarb since 1995 and seizure free per chart. Continue home phenobarbitol 64.8MG  BID If cannot get edoxaban may need to consider alternative agent  Morbid obesity Dietitian following  Muscular deconditioning- will need to return to CIR soon, will place consult  Erskine Emery MD PCCM

## 2021-09-27 NOTE — Evaluation (Signed)
Occupational Therapy Evaluation Patient Details Name: Ricky Hayden MRN: [id number] DOB: 1945/09/24 Today's Date: 09/27/2021   History of Present Illness Pt is a 76 y.o. male admitted 08/28/21 with fever, chills, SOB. Workup for sepsis secondary to nonobstructing nephrolithiasis/pyelonephritis and concurrent UTI, acute hypoxic respiratory failure secondary to volume overload, AKI on CKD. Course complicated by decline in respiratory status 5/3; ETT 5/3-5/12. PMH includes CHF, ICD, CKF, afib, COPD, seizures.  Discharged to [facility] and readmitted to [facility] 5/25 for rhythm change (unstable A--fib) with subsequent hypotension.   Clinical Impression   Patient admitted from AIR for the unstable A-fib.  Currently he is presenting with the deficits listed below which are impacting independence at this time.  Patient is hoping to return to AIR for continuation of intensive rehab with the goal of transitioning home at his prior level of function.  OT will follow in the acute setting.         Recommendations for follow up therapy are one component of a multi-disciplinary discharge planning process, led by the attending physician.  Recommendations may be updated based on patient status, additional functional criteria and insurance authorization.   Follow Up Recommendations  Acute inpatient rehab (3hours/day)    Assistance Recommended at Discharge Frequent or constant Supervision/Assistance  Patient can return home with the following Assistance with cooking/housework;Help with stairs or ramp for entrance;Direct supervision/assist for financial management;Direct supervision/assist for medications management;Two people to help with walking and/or transfers;Two people to help with bathing/dressing/bathroom;Assist for transportation    Functional Status Assessment  Patient has had a recent decline in their functional status and demonstrates the ability to make significant improvements in function in a reasonable and  predictable amount of time.  Equipment Recommendations  None recommended by OT    Recommendations for Other Services Rehab consult     Precautions / Restrictions Precautions Precautions: Fall Precaution Comments: 3 L O2, watch BP, states he has vertigo. Restrictions Weight Bearing Restrictions: No      Mobility Bed Mobility Overal bed mobility: Needs Assistance Bed Mobility: Sidelying to Sit, Sit to Sidelying   Sidelying to sit: Min assist     Sit to sidelying: Mod assist   Patient Response: Cooperative  Transfers Overall transfer level: Needs assistance Equipment used: Rolling walker (2 wheels) Transfers: Sit to/from Stand, Bed to chair/wheelchair/BSC Sit to Stand: Mod assist Stand pivot transfers: Mod assist                Balance Overall balance assessment: Needs assistance Sitting-balance support: Bilateral upper extremity supported, Feet unsupported Sitting balance-Leahy Scale: Fair     Standing balance support: Reliant on assistive device for balance Standing balance-Leahy Scale: Poor                             ADL either performed or assessed with clinical judgement   ADL Overall ADL's : Needs assistance/impaired     Grooming: Wash/dry face;Min guard;Wash/dry hands;Sitting   Upper Body Bathing: Minimal assistance;Sitting   Lower Body Bathing: Maximal assistance;Sit to/from stand   Upper Body Dressing : Sitting;Minimal assistance   Lower Body Dressing: Maximal assistance;Sit to/from stand   Toilet Transfer: Moderate assistance;Stand-pivot;BSC/3in1                   Vision Baseline Vision/History: 1 Wears glasses Patient Visual Report: No change from baseline       Perception Perception Perception: Within Functional Limits   Praxis Praxis Praxis: Intact  Pertinent Vitals/Pain Pain Assessment Faces Pain Scale: No hurt Pain Intervention(s): Monitored during session     Hand Dominance Right    Extremity/Trunk Assessment Upper Extremity Assessment Upper Extremity Assessment: Generalized weakness RUE Sensation: decreased light touch LUE Sensation: decreased light touch   Lower Extremity Assessment Lower Extremity Assessment: Defer to PT evaluation       Communication Communication Communication: No difficulties   Cognition Arousal/Alertness: Awake/alert Behavior During Therapy: Flat affect Overall Cognitive Status: Impaired/Different from baseline Area of Impairment: Attention, Problem solving, Awareness, Safety/judgement                 Orientation Level: Disoriented to, Time Current Attention Level: Selective Memory: Decreased short-term memory Following Commands: Follows one step commands with increased time Safety/Judgement: Decreased awareness of deficits, Decreased awareness of safety Awareness: Emergent Problem Solving: Slow processing, Difficulty sequencing, Requires verbal cues, Requires tactile cues, Decreased initiation       General Comments   Dizziness with positional changes.  BP steady 90's/50's    Exercises     Shoulder Instructions      Home Living Family/patient expects to be discharged to:: Private residence Living Arrangements: Alone Available Help at Discharge: Family;Available 24 hours/day Type of Home: House Home Access: Stairs to enter [employer] of Steps: 1 step onto porch and then step into house Entrance Stairs-Rails: None Home Layout: One level     Bathroom Shower/Tub: Walk-in shower;Tub/shower unit   Bathroom Toilet: Handicapped height Bathroom Accessibility: Yes How Accessible: Accessible via walker Home Equipment: Rollator (4 wheels);Hospital bed;Shower seat      Lives With: Alone;Family;Other (Comment)    Prior Functioning/Environment Prior Level of Function : Needs assist             Mobility Comments: uses rollator for ambulation in community if longer distance; walks without device in home  and short community distances, children mostly manage IADLs ADLs Comments: Occasional assist with ADL/IADL        OT Problem List: Decreased strength;Impaired balance (sitting and/or standing);Decreased range of motion;Decreased coordination;Pain;Cardiopulmonary status limiting activity;Impaired sensation;Decreased cognition;Increased edema;Decreased activity tolerance;Decreased safety awareness;Impaired tone;Impaired UE functional use      OT Treatment/Interventions: Self-care/ADL training;Patient/family education;DME and/or AE instruction;Balance training;Therapeutic exercise;Therapeutic activities;Cognitive remediation/compensation;Neuromuscular education;Energy conservation;Manual therapy    OT Goals(Current goals can be found in the care plan section) Acute Rehab OT Goals Patient Stated Goal: Would like to transition home OT Goal Formulation: With patient Time For Goal Achievement: 10/11/21 Potential to Achieve Goals: Good ADL Goals Pt Will Perform Grooming: with set-up;sitting Pt Will Perform Upper Body Dressing: with set-up;sitting Pt Will Perform Lower Body Dressing: with min assist;sit to/from stand Pt Will Transfer to Toilet: with min guard assist;ambulating;regular height toilet Pt/caregiver will Perform Home Exercise Program: Increased strength;Both right and left upper extremity;With theraband;With Supervision;With written HEP provided  OT Frequency: Min 2X/week    Co-evaluation              AM-PAC OT "6 Clicks" Daily Activity     Outcome Measure Help from another person eating meals?: A Little Help from another person taking care of personal grooming?: A Little Help from another person toileting, which includes using toliet, bedpan, or urinal?: A Lot Help from another person bathing (including washing, rinsing, drying)?: A Lot Help from another person to put on and taking off regular upper body clothing?: A Little Help from another person to put on and taking off  regular lower body clothing?: A Lot 6 Click Score: 15   End of  Session Equipment Utilized During Treatment: Gait belt;Rolling walker (2 wheels);Oxygen Nurse Communication: Mobility status  Activity Tolerance: Patient tolerated treatment well Patient left: in bed;with call bell/phone within reach;with family/visitor present  OT Visit Diagnosis: Unsteadiness on feet (R26.81);Other abnormalities of gait and mobility (R26.89);Muscle weakness (generalized) (M62.81);Other symptoms and signs involving cognitive function                Time: 7290-2111 OT Time Calculation (min): 22 min Charges:  OT General Charges $OT Visit: 1 Visit OT Evaluation $OT Eval Moderate Complexity: 1 Mod  09/27/2021  RP, OTR/L  Acute Rehabilitation Services  Office:  [phone]   Metta Clines 09/27/2021, 10:02 AM

## 2021-09-27 NOTE — Progress Notes (Signed)
Progress Note  Patient Name: Grayling Schranz Date of Encounter: 09/28/2021  [facility] Cardiologist: None   Subjective   Feels much better today. Asking to go home soon. On 3L [place]  Inpatient Medications    Scheduled Meds:  [START ON 10/04/2021] amiodarone  200 mg Oral BID   [START ON 10/11/2021] amiodarone  200 mg Oral Daily   amiodarone  400 mg Oral BID   Chlorhexidine Gluconate Cloth  6 each Topical Daily   cholecalciferol  1,000 Units Oral Daily   enoxaparin (LOVENOX) injection  130 mg Subcutaneous Q12H   finasteride  2.5 mg Oral Daily   fluticasone furoate-vilanterol  1 puff Inhalation Daily   insulin aspart  0-15 Units Subcutaneous TID WC   insulin aspart  0-5 Units Subcutaneous QHS   insulin glargine-yfgn  6 Units Subcutaneous Daily   mouth rinse  15 mL Mouth Rinse BID   melatonin  3 mg Oral QHS   pantoprazole  40 mg Oral Daily   phenobarbital  64.8 mg Oral BID   polyethylene glycol  17 g Oral Daily   pravastatin  40 mg Oral QPM   predniSONE  40 mg Oral Q breakfast   umeclidinium bromide  1 puff Inhalation Daily   Continuous Infusions:   PRN Meds: acetaminophen, albuterol, bisacodyl, polyethylene glycol   Vital Signs    Vitals:   09/28/21 0600 09/28/21 0700 09/28/21 0800 09/28/21 0811  BP: 107/60 115/68 121/77 121/77  Pulse: 60 68 75 73  Resp: (!) 21 18 14 15   Temp:  97.8 F (36.6 C)    TempSrc:  Oral    SpO2: 98% 100% 93% 99%  Weight:        Intake/Output Summary (Last 24 hours) at 09/28/2021 0842 Last data filed at 09/28/2021 0800 Gross per 24 hour  Intake 806.27 ml  Output 1500 ml  Net -693.73 ml      09/27/2021    5:00 AM 09/25/2021    5:00 AM 09/22/2021    4:48 AM  Last 3 Weights  Weight (lbs) 295 lb 6.7 oz 288 lb 12.8 oz 295 lb 6.7 oz  Weight (kg) 134 kg 131 kg 134 kg      Telemetry    A-sensed, V-paced; occasional PVCs - Personally Reviewed  ECG    A-sensed, v-paced - Personally Reviewed  Physical Exam   GEN: Sitting up in  bed, comfortable  Neck: No JVD Cardiac: Irregular, no murmurs Respiratory: Clear bilaterally.  GI: Soft, nontender, non-distended  MS: No edema; No deformity. Neuro:  Nonfocal  Psych: Normal affect   Labs    High Sensitivity Troponin:   Recent Labs  Lab 09/03/21 0850  TROPONINIHS 35*     Chemistry Recent Labs  Lab 09/25/21 0545 09/26/21 0052 09/27/21 0132  NA 138 136 140  K 3.9 4.1 4.2  CL 102 102 103  CO2 29 29 31   GLUCOSE 106* 111* 88  BUN 44* 36* 26*  CREATININE 1.50* 1.52* 1.38*  CALCIUM 9.6 9.3 9.1  MG  --  2.1 2.0  GFRNONAA 48* 47* 53*  ANIONGAP 7 5 6     Lipids No results for input(s): CHOL, TRIG, HDL, LABVLDL, LDLCALC, CHOLHDL in the last 168 hours.  Hematology Recent Labs  Lab 09/25/21 1614 09/26/21 0052 09/27/21 0132  WBC 6.1 5.4 5.0  RBC 3.37* 3.34* 3.03*  HGB 10.4* 10.3* 9.3*  HCT 32.0* 31.9* 29.2*  MCV 95.0 95.5 96.4  MCH 30.9 30.8 30.7  MCHC 32.5 32.3 31.8  RDW 13.7 13.7 13.9  PLT 169 177 147*   Thyroid No results for input(s): TSH, FREET4 in the last 168 hours.  BNPNo results for input(s): BNP, PROBNP in the last 168 hours.  DDimer No results for input(s): DDIMER in the last 168 hours.   Radiology    ECHO TEE  Result Date: 09/26/2021    TRANSESOPHOGEAL ECHO REPORT   Patient Name:   ALEKSANDAR DUVE Date of Exam: 09/26/2021 Medical Rec #:  [id number]     Height:       67.0 in Accession #:    [id number]    Weight:       288.8 lb Date of Birth:  Jan 30, 1946    BSA:          2.364 m Patient Age:    7 years      BP:           84/60 mmHg Patient Gender: M             HR:           124 bpm. Exam Location:  Inpatient Procedure: Transesophageal Echo and Color Doppler Indications:     A-flutter  History:         Patient has prior history of Echocardiogram examinations, most                  recent 08/29/2021. CHF, Defibrillator; Arrythmias:Atrial                  Flutter. NICM.  Sonographer:     Eartha Inch Sonographer#2:   Clayton Lefort RDCS (AE) Referring  Phys:  [id number] [facility] [place] Diagnosing Phys: [name] MD PROCEDURE: After discussion of the risks and benefits of a TEE, an informed consent was obtained from the patient. The transesophogeal probe was passed without difficulty through the esophogus of the patient. Sedation performed by different physician. The patient was monitored while under deep sedation. Anesthestetic sedation was provided intravenously by Anesthesiology: 145.59mg  of Propofol. Image quality was good. The patient's vital signs; including heart rate, blood pressure, and oxygen saturation; remained stable throughout the procedure. The patient developed no complications during the procedure. A successful direct current cardioversion was performed at 200 joules with 1 attempt. IMPRESSIONS  1. Left ventricular ejection fraction, by estimation, is 30 to 35%. The left ventricle has moderately decreased function. The left ventricle demonstrates global hypokinesis.  2. Right ventricular systolic function is normal. The right ventricular size is normal.  3. No left atrial/left atrial appendage thrombus was detected.  4. The mitral valve is normal in structure. Trivial mitral valve regurgitation. No evidence of mitral stenosis.  5. The aortic valve is tricuspid. Aortic valve regurgitation is trivial. No aortic stenosis is present.  6. The inferior vena cava is normal in size with greater than 50% respiratory variability, suggesting right atrial pressure of 3 mmHg. Conclusion(s)/Recommendation(s): Normal biventricular function without evidence of hemodynamically significant valvular heart disease. FINDINGS  Left Ventricle: Left ventricular ejection fraction, by estimation, is 30 to 35%. The left ventricle has moderately decreased function. The left ventricle demonstrates global hypokinesis. The left ventricular internal cavity size was normal in size. There is no left ventricular hypertrophy. Right Ventricle: The right ventricular size is  normal. No increase in right ventricular wall thickness. Right ventricular systolic function is normal. Left Atrium: Left atrial size was normal in size. No left atrial/left atrial appendage thrombus was detected. Right Atrium: Right atrial size was normal in size. Pericardium: There is no  evidence of pericardial effusion. Mitral Valve: The mitral valve is normal in structure. Trivial mitral valve regurgitation. No evidence of mitral valve stenosis. Tricuspid Valve: The tricuspid valve is normal in structure. Tricuspid valve regurgitation is not demonstrated. No evidence of tricuspid stenosis. Aortic Valve: The aortic valve is tricuspid. Aortic valve regurgitation is trivial. No aortic stenosis is present. Pulmonic Valve: The pulmonic valve was normal in structure. Pulmonic valve regurgitation is trivial. No evidence of pulmonic stenosis. Aorta: The aortic root is normal in size and structure. Venous: The inferior vena cava is normal in size with greater than 50% respiratory variability, suggesting right atrial pressure of 3 mmHg. IAS/Shunts: No atrial level shunt detected by color flow Doppler. [name] MD Electronically signed by [name] MD Signature Date/Time: 09/26/2021/3:42:25 PM    Final     Cardiac Studies   TEE 09/26/21: IMPRESSIONS     1. Left ventricular ejection fraction, by estimation, is 30 to 35%. The  left ventricle has moderately decreased function. The left ventricle  demonstrates global hypokinesis.   2. Right ventricular systolic function is normal. The right ventricular  size is normal.   3. No left atrial/left atrial appendage thrombus was detected.   4. The mitral valve is normal in structure. Trivial mitral valve  regurgitation. No evidence of mitral stenosis.   5. The aortic valve is tricuspid. Aortic valve regurgitation is trivial.  No aortic stenosis is present.   6. The inferior vena cava is normal in size with greater than 50%  respiratory variability,  suggesting right atrial pressure of 3 mmHg.   Conclusion(s)/Recommendation(s): Normal biventricular function without  evidence of hemodynamically significant valvular heart disease.   Patient Profile     76 y.o. 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 Course complicated by acute hypoxic respiratory failure s/p intubation with PEA arrest following intubation and Aflutter with RVR for which Cardiology was consulted.   Assessment & Plan    #Aflutter/Afib with RVR: Patients course was complicated by Aflutter with RVR with associated hypotension. Placed on amiodarone and ultimately underwent TEE/DCCV on 09/26/21. Was previously on apixaban which was changed due to lovenox as phenobarbital decreases effectiveness of apixaban.  -Continue amiodarone 400mg  BID x7 days, 200mg  BID x 7days and then 200mg  daily thereafter -Continue therapeutic lovenox -Attempting to try to call [facility] about Edoxaban  #Chronic Systolic Heart Failure: #S/p BiV [employer] ICD: TTE with LVEF 40-45% on TTE, but dropped to 30-35% on TEE 5/26. Likely worsened in the setting of Aflutter with RVR as well as sepsis. GDMT initially held due to hypotension which is now improved.  -Add back low dose losartan 12.5mg  daily; will transition back to home entresto as able -Resume home jardiance 10mg  daily -Will add back low dose coreg vs metop as able (previously on coreg)  #Chronic Hypoxic Respiratory Failure: #COPD on Home O2: -On 2L [place] chronically    For questions or updates, please contact [facility] Please consult [email or web address] for contact info under        Signed, Freada Bergeron, MD  09/28/2021, 8:42 AM

## 2021-09-27 NOTE — Evaluation (Signed)
Physical Therapy Evaluation Patient Details Name: Ricky Hayden MRN: [id number] DOB: [birth year] Today's Date: 09/27/2021  History of Present Illness  Pt is a 76 y.o. male admitted from [place] on 09/25/21 with unstable afib, hypotension. Underwent TEE/DCCV 5/26. Of note, initial admission to [facility] 08/28/21-09/19/21 with sepsis secondary to nonobstructing nephrolithiasis/pyelonephritis and concurrent UTI, acute hypoxic respiratory failure secondary to volume overload, AKI on CKD; ETT 5/3-5/12. Other PMH includes CHF, ICD, CKF, afib, COPD, seizures.   Clinical Impression  Pt presents with an overall decrease in functional mobility secondary to above. Prior to initial admission 08/28/21, pt mod indep with intermittent use of rollator, lives alone with children nearby who assist with ADL/iADLs as needed; pt with prolonged admission and recent d/c to CIR, now readmitted. Today, pt requiring minA for bed mobility, pt adamantly declining attempts to stand ("you will not trick me into getting to that chair") despite multiple attempts to encourage and redirect pt. Increased time discussing importance of mobility and d/c recommendations; pt ultimately requesting less intensive therapies, would prefer SNF instead of AIR, which is appropriate based on current activity tolerance. Will follow acutely to address established goals.    Recommendations for follow up therapy are one component of a multi-disciplinary discharge planning process, led by the attending physician.  Recommendations may be updated based on patient status, additional functional criteria and insurance authorization.  Follow Up Recommendations Skilled nursing-short term rehab (<3 hours/day) - pt does not want intensity of AIR    Assistance Recommended at Discharge Intermittent Supervision/Assistance  Patient can return home with the following  A lot of help with walking and/or transfers;A lot of help with bathing/dressing/bathroom;Direct  supervision/assist for medications management;Direct supervision/assist for financial management;Assist for transportation;Help with stairs or ramp for entrance;Assistance with cooking/housework    Equipment Recommendations Rolling walker (2 wheels);BSC/3in1;Wheelchair (measurements PT);Wheelchair cushion (measurements PT) (TBD)  Recommendations for Other Services       Functional Status Assessment Patient has had a recent decline in their functional status and demonstrates the ability to make significant improvements in function in a reasonable and predictable amount of time.     Precautions / Restrictions Precautions Precautions: Fall;Other (comment) Precaution Comments: Watch SpO2 (on 3L O2, does not wear baseline), recent orthostatic hypotension; h/o vertigo with current symptoms Restrictions Weight Bearing Restrictions: No      Mobility  Bed Mobility Overal bed mobility: Needs Assistance Bed Mobility: Supine to Sit, Sit to Supine     Supine to sit: Supervision Sit to supine: Min assist   General bed mobility comments: pt performed supine>sit with heavy reliance on bed rails, use of momentum, increased time and effort; minA for LLE management with return to supine; pt assisting well with BUEs/BLEs to scoot up in bed with minA    Transfers                   General transfer comment: significant increased time attempting to encourage pt to stand while pt sitting EOB, pt ultimately declining    Ambulation/Gait                  Stairs            Wheelchair Mobility    Modified Rankin (Stroke Patients Only)       Balance Overall balance assessment: Needs assistance Sitting-balance support: Feet unsupported, No upper extremity supported Sitting balance-Leahy Scale: Fair  Pertinent Vitals/Pain Pain Assessment Pain Assessment: Faces Faces Pain Scale: Hurts a little bit Pain Location:  back Pain Descriptors / Indicators: Discomfort Pain Intervention(s): Monitored during session    Home Living Family/patient expects to be discharged to:: Skilled nursing facility Living Arrangements: Alone Available Help at Discharge: Family;Available 24 hours/day Type of Home: House Home Access: Stairs to enter Entrance Stairs-Rails: None Entrance Stairs-Number of Steps: 1 step onto porch and then step into house   Home Layout: One level Home Equipment: Rollator (4 wheels);Hospital bed;Shower seat Additional Comments: children rotate staying overnight    Prior Function Prior Level of Function : Needs assist             Mobility Comments: Independent without DME in home and short community distances; use of rollator for longer distances ADLs Comments: Occasional assist from children for ADLs; children mostly manage iADLs     Hand Dominance   Dominant Hand: Right    Extremity/Trunk Assessment   Upper Extremity Assessment Upper Extremity Assessment: Generalized weakness    Lower Extremity Assessment Lower Extremity Assessment: Generalized weakness       Communication   Communication: No difficulties  Cognition Arousal/Alertness: Awake/alert Behavior During Therapy: Flat affect Overall Cognitive Status: Impaired/Different from baseline Area of Impairment: Attention, Problem solving, Awareness, Safety/judgement                   Current Attention Level: Selective Memory: Decreased short-term memory Following Commands: Follows one step commands with increased time Safety/Judgement: Decreased awareness of deficits, Decreased awareness of safety Awareness: Emergent Problem Solving: Slow processing, Difficulty sequencing, Requires verbal cues, Requires tactile cues, Decreased initiation General Comments: argumentative regarding participation, focused on "i'm not going to be tricked into that chair" despite multiple reassurances that pt does not have to sit in  recliner. very difficult to redirect pt and motivate him to participate in OOB despite encouragement from therapist and pt's son. poor insight into current deficits        General Comments General comments (skin integrity, edema, etc.): pt's son present. SpO2 >/94% on 3L O2 [place]. increased time discussing d/c plan - pt reports he does not want to return to CIR, would prefer less intensive (explained difference of AIR vs. SNF)    Exercises     Assessment/Plan    PT Assessment Patient needs continued PT services  PT Problem List Decreased strength;Decreased mobility;Cardiopulmonary status limiting activity;Decreased activity tolerance;Decreased balance;Decreased cognition;Decreased knowledge of use of DME;Pain       PT Treatment Interventions Therapeutic exercise;Gait training;Patient/family education;Therapeutic activities;Functional mobility training;Balance training;Cognitive remediation;DME instruction    PT Goals (Current goals can be found in the Care Plan section)  Acute Rehab PT Goals Patient Stated Goal: ultimate goal is home, pt also requesting less intense rehab at SNF PT Goal Formulation: With patient Time For Goal Achievement: 10/11/21 Potential to Achieve Goals: Fair    Frequency Min 3X/week     Co-evaluation               AM-PAC PT "6 Clicks" Mobility  Outcome Measure Help needed turning from your back to your side while in a flat bed without using bedrails?: A Little Help needed moving from lying on your back to sitting on the side of a flat bed without using bedrails?: A Little Help needed moving to and from a bed to a chair (including a wheelchair)?: A Lot Help needed standing up from a chair using your arms (e.g., wheelchair or bedside chair)?: A Lot Help needed  to walk in hospital room?: Total Help needed climbing 3-5 steps with a railing? : Total 6 Click Score: 12    End of Session Equipment Utilized During Treatment: Gait belt Activity Tolerance:  Patient limited by fatigue;Other (comment) (self-limiting) Patient left: in bed;with call bell/phone within reach;with family/visitor present Nurse Communication: Mobility status PT Visit Diagnosis: Muscle weakness (generalized) (M62.81);Difficulty in walking, not elsewhere classified (R26.2);Unsteadiness on feet (R26.81)    Time: 8841-6606 PT Time Calculation (min) (ACUTE ONLY): 28 min   Charges:   PT Evaluation $PT Eval Moderate Complexity: [place], PT, DPT Acute Rehabilitation Services  Pager [phone] Office [phone]  Derry Lory 09/27/2021, 2:32 PM

## 2021-09-28 DIAGNOSIS — Z7189 Other specified counseling: Secondary | ICD-10-CM

## 2021-09-28 DIAGNOSIS — I5022 Chronic systolic (congestive) heart failure: Secondary | ICD-10-CM | POA: Diagnosis not present

## 2021-09-28 DIAGNOSIS — I4819 Other persistent atrial fibrillation: Secondary | ICD-10-CM | POA: Diagnosis not present

## 2021-09-28 LAB — GLUCOSE, CAPILLARY
Glucose-Capillary: 81 mg/dL (ref 70–99)
Glucose-Capillary: 132 mg/dL — ABNORMAL HIGH (ref 70–99)
Glucose-Capillary: 160 mg/dL — ABNORMAL HIGH (ref 70–99)
Glucose-Capillary: 100 mg/dL — ABNORMAL HIGH (ref 70–99)

## 2021-09-28 MED ORDER — LOSARTAN POTASSIUM 25 MG PO TABS
12.5000 mg | ORAL_TABLET | Freq: Every day | ORAL | Status: DC
  Administered 2021-09-28 – 2021-09-30 (×3): 12.5 mg via ORAL
  Filled 2021-09-28 (×3): qty 1

## 2021-09-28 MED ORDER — EMPAGLIFLOZIN 10 MG PO TABS
10.0000 mg | ORAL_TABLET | Freq: Every day | ORAL | Status: DC
  Administered 2021-09-28 – 2021-10-01 (×4): 10 mg via ORAL
  Filled 2021-09-28 (×4): qty 1

## 2021-09-28 NOTE — Progress Notes (Signed)
NAME:  Ricky Hayden, MRN:  [id number], DOB:  1946/04/29, LOS: 3 ADMISSION DATE:  09/25/2021, CONSULTATION DATE:  09/25/2021 REFERRING MD:  Dr. Curlene Dolphin, CHIEF COMPLAINT:  Hypotension    History of Present Illness:  Ricky Hayden is a 76yo male with a PMH of AAA, cardiomyopathy, CHF, COPD, CKD, CAD, GERD, HTN, HLD, obesity, and arthritis who initially presented to [facility] ED 4/27 for complaints of SOB with hypoxia. Of note patient was seen by PCP 3 weeks prior for hematuria and was found to have proteus mirabilis bacteruria for which he was started on Bactrim.   On ED arrival patient was seen with hypoxic for which BIPAP was started, he was also seen febrile with t-max 103. UA remains concerning for active UTI. He was admitted per [facility] for sepsis workup.   Pertinent timeline for pervious admit below  4/27: admitted to [facility] for sepsis 2/2 UTI, started ceftriaxone and azithro 4/27-5/1 on ceftriaxone 5/2:  antibiotics deescalated to cefazolin. PCCM consult for hypoxia. Placed on BIPAP 5/3: Bipap all night, worsening mental status and hypoxemia, intubated, short PEA arrest after intubation, hypotensive, central line placed, CXR in AM clear, CXR after intuabntion diffuse R sided infiltrates concerning for aspiration vs HAP 5/4 stable pressors, o2 weaned, Cr elevated, Uop diminished but not oliguric,  some flexure posturing, EEG ordered - negative 5/5 pressor off, minimal vent settings, seems to follow commands, UOP improved, nephrology consult at family request 5/7 patient received 80 mg IV Lasix overnight due to increased CVP and crackles to bases 5/12 Extubated, ID signed off  5/19 Discharged to CIR  Mid morning of 5/25 patent was seen with rhythm change (unstable A--fib) with subsequent hypotension for which PCCM was consulted to assist in management   Pertinent  Medical History  HFrEF due to cardiomyopathy COPD CKD 3a GERD BPH Gout HTN Remote history of brain surgery, on chronic  phenobarb to prevent seizure but no history of seizures per daughter meningitis    Significant Hospital Events: Including procedures, antibiotic start and stop dates in addition to other pertinent events   5/25 Seen with rhythm change (unstable A--fib) with subsequent hypotension for which PCCM was consulted to assist in management   Interim History / Subjective:  Breathing improved. Wants to go home. Daughter at bedside.  Objective   Blood pressure 121/77, pulse 73, temperature 97.8 F (36.6 C), temperature source Oral, resp. rate 15, weight 134 kg, SpO2 99 %.        Intake/Output Summary (Last 24 hours) at 09/28/2021 1001 Last data filed at 09/28/2021 0800 Gross per 24 hour  Intake 806.27 ml  Output 1500 ml  Net -693.73 ml     Filed Weights   09/27/21 0500  Weight: 134 kg     Examination: No distress Heart sounds regular with occasional ectopic beats, paced Abdomen soft Lung sounds less wheezing Weak, moves all ext to command  Resolved Hospital Problem list     Assessment & Plan:  Atrial flutter/Afib with RVR Hypotension in the setting of A-fib with RVR, improved  S/P cardioversion Continue PO amiodarone Continue lovenox, eventually will be switch to edoxaban if [facility] will approve (interaction of eliquis with phenobarb)  Chronic Systolic/diastolic CHF s/p AICD GDMT on hold given borderline pressures  Chronic hypoxic respiratory failure due to COPD on home oxygen- seems to be in flare Continue triple therapy, steroids x 5 days  CKD stage IIIa BPH/bilateral nephrolithiasis Hold flowmax due to intermittent hypotension Continue proscar Avoid neprotoxic drugs as possible. Strict  intake and output Serum creatinine is at baseline  Seizure disorder Patient on phenobarb since 1995 and seizure free per chart. Continue home phenobarbitol 64.8MG  BID If cannot get edoxaban may need to consider alternative agent  Morbid obesity Dietitian following  Muscular  deconditioning- a little unclear if he wants to go back to CIR, if not he will need SNF, family aware.  Stable for transfer to tele, appreciate [facility] taking over starting 5/29  Erskine Emery MD PCCM

## 2021-09-28 NOTE — Progress Notes (Signed)
Daily Progress Note   Patient Name: Ricky Hayden       Date: 09/28/2021 DOB: [birth year]  Age: 76 y.o. MRN#: [id number] Attending Physician: Candee Furbish, MD Primary Care Physician: [facility] Admit Date: 09/25/2021  Reason for Consultation/Follow-up: Establishing goals of care  Subjective: Medical records reviewed including progress notes, labs. Patient assessed at the bedside.  His daughter [name] is present visiting.    Discussed interval history since our last conversation and patient interaction with PT yesterday, which he shares has him concerned that PT is mad with him.  He acknowledges that he adamantly refused sitting in the chair and expresses this is due to several occasions that he was not assisted back to bed when he was in pain from his pressure injury.  He feels that he was put in the chair and then told it would only be a little, while actually sitting in the chair for hours.  We discussed possible solutions including a cushion and family would be willing to bring his home recliner if possible.  We also discussed collaboration towards shared goals and understanding that there is no malicious intent, rather staff may be unable to return as soon as he would like.  Patient clarifies that he did not understand SNF as an option and many of his interactions with staff have been very confusing.  He values honesty, straightforward conversation and that we take the time to ensure he understands the implications/consequences of his decisions.  Discussed CIR vs SNF in detail, relating to patient's goals of returning home and fishing, gardening, spending time with his dog. He does not feel he would be able to do these things if he left in his current level of debility. He is also  not ready to go home with a focus on solely comfort.  Discussed the importance of patient sharing his values and goals of care so decisions can be made within this context.  Discussed the possibility of completing less intensive rehabilitation at SNF without enough recovery to go home on his own.  He would rather give himself the best chance and would like to be considered for CIR, also requesting more time to think.  Questions and concerns addressed. PMT will continue to support holistically.   Length of Stay: 3  Physical Exam Vitals and nursing note  reviewed.  Constitutional:      General: He is not in acute distress.    Appearance: He is obese.     Interventions: Nasal cannula in place.  Cardiovascular:     Rate and Rhythm: Normal rate.  Pulmonary:     Effort: Pulmonary effort is normal.  Neurological:     Mental Status: He is alert.     Motor: Weakness present.  Psychiatric:        Mood and Affect: Mood normal.        Behavior: Behavior is cooperative.            Vital Signs: BP (!) 100/55   Pulse (!) 58   Temp 97.8 F (36.6 C) (Oral)   Resp 15   Wt 134 kg   SpO2 100%   BMI 46.27 kg/m  SpO2: SpO2: 100 % O2 Device: O2 Device: Nasal Cannula O2 Flow Rate: O2 Flow Rate (L/min): 3 L/min      Palliative Assessment/Data: 40-50%     Palliative Care Assessment & Plan   Patient Profile: 76 y.o. 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   Patient was admitted for sepsis from UTI and required BiPAP in the ED. Patient was weaned off but then on 5/2 had decreased level of consciousness and increased oxygen requirement, placed back on BiPAP, became hypotensive and intubated for worsened mental status. Patient was extubated on 5/12. PMT has been consulted to assist with goals of care  conversation.    Assessment: Goals of care conversation Functional deficits secondary to debility from septic shock PPM/ICD CKD COPD CHF Atrial flutter, unstable  Recommendations/Plan: Continue full code/full scope treatment  Patient is considering CIR versus SNF, understanding that he cannot go home with his current level of debility and successfully meet his goals.  Seems to be leaning towards CIR Patient would like straightforward and honest interactions with staff, endorsing confusion and some difficulty processing conversations Discussed cushion for bedside chair with RN Discussed disposition planning with NCM/LCSW Psychosocial and emotional support provided PMT will continue to follow    Prognosis:  Unable to determine  Discharge Planning: To Be Determined    Total time: I spent 65 minutes in the care of the patient today in the above activities and documenting the encounter.   Dorthy Cooler, PA-C Palliative Medicine Team Team phone # [phone]  Thank you for allowing the Palliative Medicine Team to assist in the care of this patient. Please utilize secure chat with additional questions, if there is no response within 30 minutes please call the above phone number.  Palliative Medicine Team providers are available by phone from 7am to 7pm daily and can be reached through the team cell phone.  Should this patient require assistance outside of these hours, please call the patient's attending physician.

## 2021-09-28 NOTE — TOC Initial Note (Signed)
Transition of Care [facility]) - Initial/Assessment Note    Patient Details  Name: Ricky Hayden MRN: [id number] Date of Birth: [birth year]  Transition of Care [facility]) CM/SW Contact:    Milas Gain, [place] Phone Number: 09/28/2021, 12:15 PM  Clinical Narrative:                   CSW received consult for possible SNF placement at time of discharge. CSW spoke with patient with patients daughter regarding PT recommendation of SNF placement at time of discharge.  Patient expressed understanding of PT recommendation. Patient wants to speak with case manager regarding home health options before deciding on SNF vrs. Going home with home health. CSW informed CM. CSW will continue to follow and assist with patients dc planning needs.    Patient Goals and CMS Choice        Expected Discharge Plan and Services                                                Prior Living Arrangements/Services                       Activities of Daily Living      Permission Sought/Granted                  Emotional Assessment              Admission diagnosis:  A-fib [facility]) [I48.91] Patient Active Problem List   Diagnosis Date Noted   Hypotension 09/26/2021   A-fib ([place]) 09/25/2021   Debility 09/19/2021   Cardiac arrest ([place]) 09/18/2021   Delirium 09/18/2021   Morbid obesity ([place]) 09/18/2021   Nephrolithiasis 09/18/2021   Paroxysmal atrial fibrillation ([place]) 09/17/2021   History of craniotomy 09/17/2021   Seizure disorder ([place]) 09/17/2021   Normocytic anemia 09/17/2021   Pressure injury of sacral region, stage 2 ([place]) 09/17/2021   Goals of care, counseling/discussion    Type 2 diabetes mellitus with chronic kidney disease, without long-term current use of insulin ([place]) 09/14/2021   Bacteremia due to Gram-negative bacteria    Complicated UTI (urinary tract infection)    Acute on chronic respiratory failure with hypoxia and hypercapnia ([place])    Septic shock (HCC)     Proteus (mirabilis) (morganii) as the cause of diseases classified elsewhere 09/02/2021   ARF (acute renal failure) ([place]) 08/29/2021   Presence of biventricular cardiac pacemaker 05/15/2020   PVC (premature ventricular contraction) 12/11/2019   NICM (nonischemic cardiomyopathy) ([place]) 12/11/2019   Stage 3b chronic kidney disease (CKD) ([place]) 08/12/2019   Hyperkalemia    Acute on chronic combined systolic and diastolic CHF, NYHA class 2 ([place]) 06/10/2015   Leg swelling 07/16/2014   Preventative health care 03/07/2014   COPD (chronic obstructive pulmonary disease) ([place]) 09/11/2013   BPPV (benign paroxysmal positional vertigo) 10/05/2012   BPH (benign prostatic hyperplasia) 01/10/2008   Gout 04/22/2006   ERECTILE DYSFUNCTION 04/22/2006   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   Hyperlipidemia 03/15/2006   Essential hypertension 03/15/2006   GERD 03/15/2006   HIATAL HERNIA 03/15/2006   OSTEOARTHRITIS 03/15/2006   Traumatic brain injury ([place]) 03/15/2006   PCP:  [facility], [place]:   [place], [place] - [address] [place] [address] Phone: [phone] Fax: [phone]     Social Determinants  of Health (SDOH) Interventions    Readmission Risk Interventions    09/08/2021    3:40 PM 08/18/2019   11:08 AM 08/17/2019    3:41 PM  Readmission Risk Prevention Plan  Transportation Screening Complete Complete Complete  PCP or Specialist Appt within 5-7 Days   Complete  PCP or Specialist Appt within 3-5 Days  Complete   Home Care Screening   Complete  Medication Review (RN CM)   Complete  HRI or Home Care Consult  Complete   Social Work Consult for [place] Planning/Counseling  Complete   Palliative Care Screening  Complete   Medication Review Press photographer) Referral to Pharmacy Complete   PCP or Specialist appointment within 3-5 days of discharge Not Complete    PCP/Specialist Appt  Not Complete comments Patient currently in ICU    [place] or [place] Not Complete    [place] or Home Care Consult Pt Refusal Comments CIR recommended at this time    SW Recovery Care/Counseling Consult Complete    Palliative Care Screening Not [place] Not Applicable

## 2021-09-28 NOTE — Progress Notes (Signed)
Occupational Therapy Discharge Note  This patient was unable to complete the inpatient rehab program due to discharge from CIR for further medical treatment; therefore did not meet their long term goals. Pt left the program at a max assist level for their  functional ADLs. This patient is being discharged from OT services at this time.  BIMS at time of d/c  Pt unable to complete due to medical status  See CareTool for functional status details.  If the patient is able to return to inpatient rehabilitation within 3 midnights, this may be considered an interrupted stay and therapy services will resume as ordered. Modification and reinstatement of their goals will be made upon completion of therapy service reevaluations.

## 2021-09-29 DIAGNOSIS — Z9981 Dependence on supplemental oxygen: Secondary | ICD-10-CM

## 2021-09-29 DIAGNOSIS — J9611 Chronic respiratory failure with hypoxia: Secondary | ICD-10-CM

## 2021-09-29 DIAGNOSIS — I480 Paroxysmal atrial fibrillation: Secondary | ICD-10-CM

## 2021-09-29 DIAGNOSIS — I5022 Chronic systolic (congestive) heart failure: Secondary | ICD-10-CM

## 2021-09-29 DIAGNOSIS — R5381 Other malaise: Secondary | ICD-10-CM

## 2021-09-29 LAB — GLUCOSE, CAPILLARY
Glucose-Capillary: 83 mg/dL (ref 70–99)
Glucose-Capillary: 107 mg/dL — ABNORMAL HIGH (ref 70–99)
Glucose-Capillary: 134 mg/dL — ABNORMAL HIGH (ref 70–99)
Glucose-Capillary: 123 mg/dL — ABNORMAL HIGH (ref 70–99)

## 2021-09-29 MED ORDER — CARVEDILOL 3.125 MG PO TABS
3.1250 mg | ORAL_TABLET | Freq: Two times a day (BID) | ORAL | Status: DC
  Administered 2021-09-29 – 2021-10-01 (×5): 3.125 mg via ORAL
  Filled 2021-09-29 (×5): qty 1

## 2021-09-29 NOTE — Progress Notes (Signed)
Inpatient Rehabilitation Admissions Coordinator   I met at bedside with patient and palliative. Patient is asking to return to CIR to complete his rehab. I discussed the goals of 3 hrs per day of therapy and he is in agreement. I have contacted acute therapy department to request PT assessment in the am for current recommendation is SNF. [facility] [place] [facility], is closed today for the holiday. We can open case for possible readmit to CIR Tuesday with updated PT recommendations.  Danne Baxter, RN, MSN Rehab Admissions Coordinator [phone] 09/29/2021 12:06 PM

## 2021-09-29 NOTE — Progress Notes (Addendum)
PROGRESS NOTE  Ricky Hayden  LFY:[id number] DOB: 1946/01/17 DOA: 09/25/2021 PCP: [facility]   Brief Narrative: Patient is a 76 year old male with history of abdominal aortic aneurysm, systolic congestive heart failure,  CKD stage IIIa, coronary artery disease, hypertension, hyperlipidemia, obesity, GERD who presented here initially with complaints of shortness of breath.  Patient was found to be hypoxic in presentation.    On presentation he was hypoxic, put on BiPAP.  He was also febrile so started on antibiotics.  Hospital course remarkable for worsening mental status, hypoxemia.  He was transferred to the ICU, intubated.  Went into short PA arrest after intubation, became hypotensive, had to be started on pressors.  Patient was then extubated, became  hemodynamically stable and was discharged to CIR.  On 5/25 at CIR, he was found to have rhythm change, found to be in A-fib with hypotension.  Patient was then admitted to ICU, cardiology consulted.  Underwent cardioversion.  Currently hemodynamically stable.  Ultimate plan for discharge to a skilled nursing facility as per PT/OT.  Consideration for CIR as well.  TOC following.  Assessment & Plan:  Principal Problem:   A-fib ([place]) Active Problems:   Hyperlipidemia   GERD   Morbid obesity ([place])   Debility   A-flutter/A-fib with RVR: Status post cardioversion.  Currently on oral amiodarone.  Currently on Lovenox for stroke prevention due to phenobarbital use, cannot be on  Eliquis.  Currently on low-dose Coreg.  Cardiology following.  While he was in PCCM service, plan was to switch him to edoxaban/checking if [facility] will approve  Chronic systolic congestive heart failure: Currently on losartan, Coreg, Jardiance.  Cardiology planning to put him back on Entresto if blood pressure permits.Has a pacemaker.  Chronic hypoxic respiratory failure/acute exacerbation of COPD: Currently on baseline oxygen requirement.  Started on steroids by  PCCM.  Continue bronchodilators. On his last admission, he was intubated and sent to ICU.  Currently respiratory status stable.  He is at on oxygen at home on 2 L as needed.  Currently on 2 L of oxygen per minute.  History of nephrolithiasis: Follows with urology .  Needs outpatient follow-up for cystoscopy  Type of diabetes: Continue current insulin regimen.  Monitor blood sugars.  On Jardiance  Stage III CKD: Baseline creatinine around 1.3-1.6.  Currently kidney function at baseline.  History of BPH: On Proscar.  Seizure disorder: On phenobarbital since 1995.  Currently seizure-free. Continue.  Follow-up with neurology as an outpatient  Morbid obesity: BMI of 47.  Septic shock/gram-negative bacteremia/complicated UTI: Was admitted here on 4/27, found to have Proteus Mirabella's bacteremia..  Completed 10 days course of antibiotics.  Currently hemodynamically stable  Debility/deconditioning: Patient seen by PT and OT, recommended skilled nursing facility on discharge.  He was recently sent from here to CIR  Goals of care: Multiple comorbidities.  Palliative care was consulted, plan is to remain as full code          Pressure Injury 09/09/21 Buttocks Medial;Bilateral Deep Tissue Pressure Injury - Purple or maroon localized area of discolored intact skin or blood-filled blister due to damage of underlying soft tissue from pressure and/or shear. purple/red with petechi (Active)  09/09/21 2100  Location: Buttocks  Location Orientation: Medial;Bilateral  Staging: Deep Tissue Pressure Injury - Purple or maroon localized area of discolored intact skin or blood-filled blister due to damage of underlying soft tissue from pressure and/or shear.  Wound Description (Comments): purple/red with petechiae  Present on Admission: No  Dressing Type Foam -  Lift dressing to assess site every shift 09/28/21 2300     Pressure Injury 09/17/21 Anus Medial Stage 2 -  Partial thickness loss of dermis  presenting as a shallow open injury with a red, pink wound bed without slough. (Active)  09/17/21 1115  Location: Anus  Location Orientation: Medial  Staging: Stage 2 -  Partial thickness loss of dermis presenting as a shallow open injury with a red, pink wound bed without slough.  Wound Description (Comments):   Present on Admission:   Dressing Type Foam - Lift dressing to assess site every shift 09/28/21 2300    DVT prophylaxis:SCDs Start: 09/25/21 1508     Code Status: Full Code  Family Communication: None at the bedside  Patient status:Inpatient  Patient is from :CIR  Anticipated discharge to:SNF versus CIR  Estimated DC date: 2 to 3 days   Consultants: Cardiology, PCCM  Procedures: Cardioversion  Antimicrobials:  Anti-infectives (From admission, onward)    None       Subjective: Patient seen and examined at the bedside this morning.  Hemodynamically stable.  Lying in bed.  Currently in normal sinus rhythm.  Denies any new complaints.  Objective: Vitals:   09/28/21 1000 09/28/21 2138 09/29/21 0017 09/29/21 0440  BP: (!) 100/55 128/70 117/68 129/72  Pulse: (!) 58 70 71 66  Resp:  16 18 16   Temp:  98.1 F (36.7 C) 98.3 F (36.8 C) 98.1 F (36.7 C)  TempSrc:  Oral Oral Oral  SpO2: 100% 97% 99% 99%  Weight:    (!) 137 kg    Intake/Output Summary (Last 24 hours) at 09/29/2021 1142 Last data filed at 09/29/2021 0443 Gross per 24 hour  Intake 240 ml  Output 700 ml  Net -460 ml   Filed Weights   09/27/21 0500 09/29/21 0440  Weight: 134 kg (!) 137 kg    Examination:  General exam: Overall comfortable, not in distress, obese HEENT: PERRL Respiratory system:  no wheezes or crackles  Cardiovascular system: S1 & S2 heard, RRR.  Gastrointestinal system: Abdomen is nondistended, soft and nontender. Central nervous system: Alert and oriented Extremities: No edema, no clubbing ,no cyanosis Skin: No rashes, no ulcers,no icterus     Data Reviewed: I have  personally reviewed following labs and imaging studies  CBC: Recent Labs  Lab 09/23/21 0516 09/25/21 1614 09/26/21 0052 09/27/21 0132  WBC 5.5 6.1 5.4 5.0  HGB 10.3* 10.4* 10.3* 9.3*  HCT 31.5* 32.0* 31.9* 29.2*  MCV 94.6 95.0 95.5 96.4  PLT 182 169 177 790*   Basic Metabolic Panel: Recent Labs  Lab 09/23/21 0516 09/24/21 0548 09/25/21 0545 09/26/21 0052 09/27/21 0132  NA 137 138 138 136 140  K 4.0 3.9 3.9 4.1 4.2  CL 99 102 102 102 103  CO2 33* 29 29 29 31   GLUCOSE 107* 120* 106* 111* 88  BUN 39* 49* 44* 36* 26*  CREATININE 1.63* 1.71* 1.50* 1.52* 1.38*  CALCIUM 9.4 9.5 9.6 9.3 9.1  MG  --   --   --  2.1 2.0  PHOS  --   --   --  3.8  --      No results found for this or any previous visit (from the past 240 hour(s)).   Radiology Studies: No results found.  Scheduled Meds:  [START ON 10/04/2021] amiodarone  200 mg Oral BID   [START ON 10/11/2021] amiodarone  200 mg Oral Daily   amiodarone  400 mg Oral BID   carvedilol  3.125 mg Oral BID WC   Chlorhexidine Gluconate Cloth  6 each Topical Daily   cholecalciferol  1,000 Units Oral Daily   empagliflozin  10 mg Oral Daily   enoxaparin (LOVENOX) injection  130 mg Subcutaneous Q12H   finasteride  2.5 mg Oral Daily   fluticasone furoate-vilanterol  1 puff Inhalation Daily   insulin aspart  0-15 Units Subcutaneous TID WC   insulin aspart  0-5 Units Subcutaneous QHS   insulin glargine-yfgn  6 Units Subcutaneous Daily   losartan  12.5 mg Oral Daily   mouth rinse  15 mL Mouth Rinse BID   melatonin  3 mg Oral QHS   pantoprazole  40 mg Oral Daily   PHENobarbital  64.8 mg Oral BID   polyethylene glycol  17 g Oral Daily   pravastatin  40 mg Oral QPM   predniSONE  40 mg Oral Q breakfast   umeclidinium bromide  1 puff Inhalation Daily   Continuous Infusions:   LOS: 4 days   Shelly Coss, MD [facility] P5/29/2023, 11:42 AM

## 2021-09-29 NOTE — Progress Notes (Signed)
Progress Note  Patient Name: Ricky Hayden Date of Encounter: 09/29/2021  [facility] Cardiologist: None Dr. Caryl Comes  Subjective   Feels well.  Hoping to go home soon.   Inpatient Medications    Scheduled Meds:  [START ON 10/04/2021] amiodarone  200 mg Oral BID   [START ON 10/11/2021] amiodarone  200 mg Oral Daily   amiodarone  400 mg Oral BID   Chlorhexidine Gluconate Cloth  6 each Topical Daily   cholecalciferol  1,000 Units Oral Daily   empagliflozin  10 mg Oral Daily   enoxaparin (LOVENOX) injection  130 mg Subcutaneous Q12H   finasteride  2.5 mg Oral Daily   fluticasone furoate-vilanterol  1 puff Inhalation Daily   insulin aspart  0-15 Units Subcutaneous TID WC   insulin aspart  0-5 Units Subcutaneous QHS   insulin glargine-yfgn  6 Units Subcutaneous Daily   losartan  12.5 mg Oral Daily   mouth rinse  15 mL Mouth Rinse BID   melatonin  3 mg Oral QHS   pantoprazole  40 mg Oral Daily   PHENobarbital  64.8 mg Oral BID   polyethylene glycol  17 g Oral Daily   pravastatin  40 mg Oral QPM   predniSONE  40 mg Oral Q breakfast   umeclidinium bromide  1 puff Inhalation Daily   Continuous Infusions:  PRN Meds: acetaminophen, albuterol, bisacodyl, polyethylene glycol   Vital Signs    Vitals:   09/28/21 1000 09/28/21 2138 09/29/21 0017 09/29/21 0440  BP: (!) 100/55 128/70 117/68 129/72  Pulse: (!) 58 70 71 66  Resp:  16 18 16   Temp:  98.1 F (36.7 C) 98.3 F (36.8 C) 98.1 F (36.7 C)  TempSrc:  Oral Oral Oral  SpO2: 100% 97% 99% 99%  Weight:    (!) 137 kg    Intake/Output Summary (Last 24 hours) at 09/29/2021 0915 Last data filed at 09/29/2021 0443 Gross per 24 hour  Intake 240 ml  Output 700 ml  Net -460 ml      09/29/2021    4:40 AM 09/27/2021    5:00 AM 09/25/2021    5:00 AM  Last 3 Weights  Weight (lbs) 302 lb 0.5 oz 295 lb 6.7 oz 288 lb 12.8 oz  Weight (kg) 137 kg 134 kg 131 kg      Telemetry    A sensed V paced - Personally Reviewed  ECG     None recent  Physical Exam   GEN: No acute distress.  Wearing [place] O2 Neck: No JVD Cardiac: RRR, premature beats, no murmurs, rubs, or gallops.  Respiratory: Clear to auscultation bilaterally. GI: Soft, nontender, non-distended  MS: No edema; No deformity. Neuro:  Nonfocal  Psych: Normal affect   Labs    High Sensitivity Troponin:   Recent Labs  Lab 09/03/21 0850  TROPONINIHS 35*     Chemistry Recent Labs  Lab 09/25/21 0545 09/26/21 0052 09/27/21 0132  NA 138 136 140  K 3.9 4.1 4.2  CL 102 102 103  CO2 29 29 31   GLUCOSE 106* 111* 88  BUN 44* 36* 26*  CREATININE 1.50* 1.52* 1.38*  CALCIUM 9.6 9.3 9.1  MG  --  2.1 2.0  GFRNONAA 48* 47* 53*  ANIONGAP 7 5 6     Lipids No results for input(s): CHOL, TRIG, HDL, LABVLDL, LDLCALC, CHOLHDL in the last 168 hours.  Hematology Recent Labs  Lab 09/25/21 1614 09/26/21 0052 09/27/21 0132  WBC 6.1 5.4 5.0  RBC 3.37* 3.34*  3.03*  HGB 10.4* 10.3* 9.3*  HCT 32.0* 31.9* 29.2*  MCV 95.0 95.5 96.4  MCH 30.9 30.8 30.7  MCHC 32.5 32.3 31.8  RDW 13.7 13.7 13.9  PLT 169 177 147*   Thyroid No results for input(s): TSH, FREET4 in the last 168 hours.  BNPNo results for input(s): BNP, PROBNP in the last 168 hours.  DDimer No results for input(s): DDIMER in the last 168 hours.   Radiology    No results found.  Cardiac Studies   Decreased EF; s/p TEE CV  Patient Profile     76 y.o. male with AFib, s/p CV, Amio for sinus rhythm  Assessment & Plan    AFib: loading Amio. Lovenox for stroke prevention due to phenobarb use. Will add back low dose Coreg today.  BP should tolerate.    Chronic systolic heart failure: Losartan added back for CHF.  BP stable.  As BP increases, can try to get him back on home regimen of Coreg and Entresto.   On Jardiance.   Would add back Entresto to replace losartan if BP continues to rise.   Chronic home O2.  Dispo: deciding on SNF vs home health.  CM following.  Further recs per primary team.       For questions or updates, please contact [place] Please consult [email or web address] for contact info under        Signed, Larae Grooms, MD  09/29/2021, 9:15 AM

## 2021-09-29 NOTE — Progress Notes (Signed)
Daily Progress Note   Patient Name: Ricky Hayden       Date: 09/29/2021 DOB: 1945-05-20  Age: 76 y.o. MRN#: [id number] Attending Physician: Shelly Coss, MD Primary Care Physician: [facility] Admit Date: 09/25/2021  Reason for Consultation/Follow-up: Establishing goals of care  Subjective: Medical records reviewed including progress notes, labs. Patient assessed at the bedside.  He shares that he had a good breakfast and he is feeling fine today.  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  Created space and opportunity for his thoughts and feelings on his current illness.  He continues to voice it is important for him that he is not "tricked" into sitting in the bedside chair for prolonged period of several hours.  He has ruled out SNF as an option after more deliberation and would be open to CIR.  He wishes that he was healthy enough to go home with home health, but also acknowledges that he is not where he was when this previously worked well after his COVID infection.  We discussed the loss of his wife and how this is difficult for him.  If she were here, she would tell him to do what he wants.  His goals are clear for improvement, while also feeling that he is 76 years old and there are just some things he cannot do.  He also wishes to avoid paying for a private caregiver.  We were then joined by MD and CIR admissions coordinator, who clarified that patients are encouraged to get out of the bedside chair after an hour to continue mobility exercises.  He understands the review process consents.  If needed to work with PT today to expedite the approval process he would be willing to do so.  Provided  update that this would likely be first thing tomorrow.  I then called the patient's daughter Ricky Hayden to provide updates on the above conversation.  She is grateful for the support offered to the patient and that he came to this decision on his own.  We discussed patient's values and the importance of being upfront with the patient to set expectations clearly.  Questions and concerns addressed. PMT will continue to support holistically.   Length of Stay:  4  Physical Exam Vitals and nursing note reviewed.  Constitutional:      General: He is not in acute distress.    Appearance: He is obese.     Interventions: Nasal cannula in place.  Cardiovascular:     Rate and Rhythm: Normal rate.  Pulmonary:     Effort: Pulmonary effort is normal.  Neurological:     Mental Status: He is alert.     Motor: Weakness present.  Psychiatric:        Mood and Affect: Mood normal.        Behavior: Behavior is cooperative.            Vital Signs: BP 129/72 (BP Location: Right Arm)   Pulse 66   Temp 98.1 F (36.7 C) (Oral)   Resp 16   Wt (!) 137 kg   SpO2 99%   BMI 47.30 kg/m  SpO2: SpO2: 99 % O2 Device: O2 Device: Nasal Cannula O2 Flow Rate: O2 Flow Rate (L/min): 3 L/min      Palliative Assessment/Data: 40-50%     Palliative Care Assessment & Plan   Patient Profile: 76 y.o. 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   Patient was admitted for sepsis from UTI and required BiPAP in the ED. Patient was weaned off but then on 5/2 had decreased level of consciousness and increased oxygen requirement, placed back on BiPAP, became hypotensive and intubated for worsened mental status. Patient was extubated on 5/12. PMT has been consulted to assist with goals of care conversation.    Assessment: Goals of care  conversation Functional deficits secondary to debility from septic shock PPM/ICD CKD COPD CHF Atrial flutter, unstable  Recommendations/Plan: Continue full code/full scope treatment  Patient has decided on CIR Patient would like straightforward and honest interactions with staff, endorsing confusion and some difficulty processing conversations Psychosocial and emotional support provided PMT will continue to follow    Prognosis:  Unable to determine  Discharge Planning: To Be Determined   Total time: I spent 55 minutes in the care of the patient today in the above activities and documenting the encounter.    Dorthy Cooler, PA-C Palliative Medicine Team Team phone # [phone]  Thank you for allowing the Palliative Medicine Team to assist in the care of this patient. Please utilize secure chat with additional questions, if there is no response within 30 minutes please call the above phone number.  Palliative Medicine Team providers are available by phone from 7am to 7pm daily and can be reached through the team cell phone.  Should this patient require assistance outside of these hours, please call the patient's attending physician.

## 2021-09-29 NOTE — Plan of Care (Signed)

## 2021-09-29 NOTE — Discharge Summary (Signed)
Physical Therapy Discharge Note  This patient was unable to complete the inpatient rehab program due to transfer back to acute for medical reasons; therefore did not meet their long term goals. Pt left the program at a max to +2  assist level for their functional mobility/ transfers. This patient is being discharged from PT services at this time.  Pt's perception of pain in the last five days was unable to answer at this time.    See CareTool for functional status details  If the patient is able to return to inpatient rehabilitation within 3 midnights, this may be considered an interrupted stay and therapy services will resume as ordered. Modification and reinstatement of their goals will be made upon completion of therapy service reevaluations.

## 2021-09-29 NOTE — Progress Notes (Signed)
Speech Therapy Discharge Note  This patient was unable to complete the inpatient rehab program due to decline in medical status; therefore did not meet their long term goals. Pt left the program at a Mod-Max assist level for their cognition. They are currently on a Dys. 3 die with thin liquids. This patient is being discharged from SLP services at this time.   See CareTool for functional status details.  If the patient is able to return to inpatient rehabilitation within 3 midnights, this may be considered an interrupted stay and therapy services will resume as ordered. Modification and reinstatement of their goals will be made upon completion of therapy service reevaluations.

## 2021-09-29 NOTE — Progress Notes (Signed)
Occupational Therapy Treatment Patient Details Name: Ricky Hayden MRN: [id number] DOB: 1945-08-24 Today's Date: 09/29/2021   History of present illness Pt is a 76 y.o. male admitted from [place] on 09/25/21 with unstable afib, hypotension. Underwent TEE/DCCV 5/26. Of note, initial admission to [facility] 08/28/21-09/19/21 with sepsis secondary to nonobstructing nephrolithiasis/pyelonephritis and concurrent UTI, acute hypoxic respiratory failure secondary to volume overload, AKI on CKD; ETT 5/3-5/12. Other PMH includes CHF, ICD, CKF, afib, COPD, seizures.   OT comments  Patient sitting on EOB with daughter upon arrival. Patient stated he would walk to sink if he had a rollator. Patient provided rollator and was mod assist to stand and min assist to ambulate to sink. Patient performed grooming tasks and UB bathing seated at sink with supervision and increased time. Patient ambulated back to EOB and performed gown change before going to supine due to fatigue. Patient stated he did want to go back to AIR for continued rehab. Patient would benefit from rehab in AIR setting to increase activity tolerance, safety, and independence with self care.    Recommendations for follow up therapy are one component of a multi-disciplinary discharge planning process, led by the attending physician.  Recommendations may be updated based on patient status, additional functional criteria and insurance authorization.    Follow Up Recommendations  Acute inpatient rehab (3hours/day)    Assistance Recommended at Discharge Frequent or constant Supervision/Assistance  Patient can return home with the following  Assistance with cooking/housework;Help with stairs or ramp for entrance;Direct supervision/assist for financial management;Direct supervision/assist for medications management;Two people to help with walking and/or transfers;Two people to help with bathing/dressing/bathroom;Assist for transportation   Equipment Recommendations   None recommended by OT    Recommendations for Other Services      Precautions / Restrictions Precautions Precautions: Fall;Other (comment) Precaution Comments: Watch SpO2 (on 3L O2, does not wear baseline), recent orthostatic hypotension; h/o vertigo with current symptoms Restrictions Weight Bearing Restrictions: No       Mobility Bed Mobility Overal bed mobility: Needs Assistance Bed Mobility: Sit to Supine       Sit to supine: Min assist   General bed mobility comments: sitting on EOB upon arrival.  Assisted to supine following end of session. Patient able to pull self up in bed with use of rails    Transfers Overall transfer level: Needs assistance Equipment used: Rollator (4 wheels) Transfers: Sit to/from Stand, Bed to chair/wheelchair/BSC Sit to Stand: Mod assist           General transfer comment: Mod assist to stand from EOB and from chair at sink.  Min assis to ambulate to sink with rollator walker     Balance Overall balance assessment: Needs assistance Sitting-balance support: Feet unsupported, No upper extremity supported Sitting balance-Leahy Scale: Fair Sitting balance - Comments: sitting on EOB  upon arrival with daughter doing puzzles   Standing balance support: Reliant on assistive device for balance Standing balance-Leahy Scale: Poor Standing balance comment: reliant on BUE support and external assist                           ADL either performed or assessed with clinical judgement   ADL Overall ADL's : Needs assistance/impaired     Grooming: Wash/dry hands;Wash/dry face;Oral care;Supervision/safety;Sitting Grooming Details (indicate cue type and reason): sitting at sink Upper Body Bathing: Supervision/ safety;Sitting Upper Body Bathing Details (indicate cue type and reason): sitting at sink     Upper Body Dressing :  Sitting;Minimal assistance Upper Body Dressing Details (indicate cue type and reason): for changing gown                    General ADL Comments: Patient ambulated to sink for grooming and performed seated due to fatigue. Returned to EOB to address gown change    Extremity/Trunk Assessment              Vision       Perception     Praxis      Cognition Arousal/Alertness: Awake/alert Behavior During Therapy: Flat affect Overall Cognitive Status: Impaired/Different from baseline Area of Impairment: Attention, Problem solving, Awareness, Safety/judgement                   Current Attention Level: Selective Memory: Decreased short-term memory Following Commands: Follows one step commands with increased time Safety/Judgement: Decreased awareness of deficits, Decreased awareness of safety Awareness: Emergent Problem Solving: Slow processing, Difficulty sequencing, Requires verbal cues, Requires tactile cues, Decreased initiation General Comments: jovial during treatment        Exercises      Shoulder Instructions       General Comments      Pertinent Vitals/ Pain       Pain Assessment Pain Assessment: Faces Faces Pain Scale: Hurts a little bit Pain Location: back Pain Descriptors / Indicators: Discomfort Pain Intervention(s): Monitored during session, Repositioned  Home Living                                          Prior Functioning/Environment              Frequency  Min 2X/week        Progress Toward Goals  OT Goals(current goals can now be found in the care plan section)  Progress towards OT goals: Progressing toward goals  Acute Rehab OT Goals Patient Stated Goal: get better OT Goal Formulation: With patient/family Time For Goal Achievement: 10/11/21 Potential to Achieve Goals: Good ADL Goals Pt Will Perform Eating: with set-up;with adaptive utensils;sitting Pt Will Perform Grooming: with set-up;sitting Pt Will Perform Upper Body Bathing: sitting;with min assist Pt Will Perform Lower Body Bathing: with mod  assist;with adaptive equipment;sitting/lateral leans;sit to/from stand Pt Will Perform Upper Body Dressing: with set-up;sitting Pt Will Perform Lower Body Dressing: with min assist;sit to/from stand Pt Will Transfer to Toilet: with min guard assist;ambulating;regular height toilet Pt Will Perform Toileting - Clothing Manipulation and hygiene: with mod assist;sit to/from stand;sitting/lateral leans Pt/caregiver will Perform Home Exercise Program: Increased strength;Both right and left upper extremity;With theraband;With Supervision;With written HEP provided Additional ADL Goal #1: Pt will perform UE HEP and neck ROM exercises, tolerating 10 reps each with <3/10 pain. Additional ADL Goal #2: Pt will follow all one step commands  Plan Discharge plan remains appropriate    Co-evaluation                 AM-PAC OT "6 Clicks" Daily Activity     Outcome Measure   Help from another person eating meals?: A Little Help from another person taking care of personal grooming?: A Little Help from another person toileting, which includes using toliet, bedpan, or urinal?: A Lot Help from another person bathing (including washing, rinsing, drying)?: A Lot Help from another person to put on and taking off regular upper body clothing?: A Little Help from another person to put  on and taking off regular lower body clothing?: A Lot 6 Click Score: 15    End of Session Equipment Utilized During Treatment: Gait belt;Rollator (4 wheels);Oxygen  OT Visit Diagnosis: Unsteadiness on feet (R26.81);Other abnormalities of gait and mobility (R26.89);Muscle weakness (generalized) (M62.81);Other symptoms and signs involving cognitive function   Activity Tolerance Patient tolerated treatment well   Patient Left in bed;with call bell/phone within reach;with family/visitor present   Nurse Communication Mobility status        Time: 2902-1115 OT Time Calculation (min): 35 min  Charges: OT General Charges $OT  Visit: 1 Visit OT Treatments $Self Care/Home Management : 23-37 mins  Lodema Hong, [place]  Pager [phone] Office [place] 09/29/2021, 2:49 PM

## 2021-09-30 ENCOUNTER — Encounter (HOSPITAL_COMMUNITY): Payer: Self-pay | Admitting: Cardiovascular Disease

## 2021-09-30 ENCOUNTER — Other Ambulatory Visit: Payer: Self-pay

## 2021-09-30 DIAGNOSIS — R5381 Other malaise: Secondary | ICD-10-CM | POA: Diagnosis not present

## 2021-09-30 DIAGNOSIS — I480 Paroxysmal atrial fibrillation: Secondary | ICD-10-CM | POA: Diagnosis not present

## 2021-09-30 DIAGNOSIS — I428 Other cardiomyopathies: Secondary | ICD-10-CM

## 2021-09-30 LAB — GLUCOSE, CAPILLARY
Glucose-Capillary: 72 mg/dL (ref 70–99)
Glucose-Capillary: 91 mg/dL (ref 70–99)
Glucose-Capillary: 121 mg/dL — ABNORMAL HIGH (ref 70–99)
Glucose-Capillary: 124 mg/dL — ABNORMAL HIGH (ref 70–99)

## 2021-09-30 LAB — CBC
HCT: 31.1 % — ABNORMAL LOW (ref 39.0–52.0)
Hemoglobin: 10 g/dL — ABNORMAL LOW (ref 13.0–17.0)
MCH: 31.1 pg (ref 26.0–34.0)
MCHC: 32.2 g/dL (ref 30.0–36.0)
MCV: 96.6 fL (ref 80.0–100.0)
Platelets: 115 10*3/uL — ABNORMAL LOW (ref 150–400)
RBC: 3.22 MIL/uL — ABNORMAL LOW (ref 4.22–5.81)
RDW: 14 % (ref 11.5–15.5)
WBC: 4.5 10*3/uL (ref 4.0–10.5)
nRBC: 0 % (ref 0.0–0.2)

## 2021-09-30 LAB — BASIC METABOLIC PANEL
Anion gap: 3 — ABNORMAL LOW (ref 5–15)
BUN: 13 mg/dL (ref 8–23)
CO2: 31 mmol/L (ref 22–32)
Calcium: 9.4 mg/dL (ref 8.9–10.3)
Chloride: 105 mmol/L (ref 98–111)
Creatinine, Ser: 1.14 mg/dL (ref 0.61–1.24)
GFR, Estimated: >60 mL/min (ref >60)
Glucose, Bld: 82 mg/dL (ref 70–99)
Potassium: 3.9 mmol/L (ref 3.5–5.1)
Sodium: 139 mmol/L (ref 135–145)

## 2021-09-30 LAB — PROTIME-INR
INR: 1.1 (ref 0.8–1.2)
Prothrombin Time: 14.2 s (ref 11.4–15.2)

## 2021-09-30 MED ORDER — WARFARIN SODIUM 5 MG PO TABS
5.0000 mg | ORAL_TABLET | Freq: Once | ORAL | Status: AC
  Administered 2021-09-30: 5 mg via ORAL
  Filled 2021-09-30: qty 1

## 2021-09-30 MED ORDER — WARFARIN - PHARMACIST DOSING INPATIENT: Freq: Every day | Status: DC

## 2021-09-30 NOTE — Progress Notes (Addendum)
Addendum Per [place] comprehensive anticoagulation / drug interaction document   [email or web address]  Phenobarbital also interacts with endoxaban Recommend enoxaparin bridge to therapeutic INR 2-3 after recent DCCV  Per MD start warfarin tonight   Warfarin 5mg  x1 tonight  Caution with dosing given amiodarone and phenobarbital   Bonnita Nasuti Pharm.D. CPP, BCPS Clinical Pharmacist [phone] 09/30/2021 4:12 PM    Spoke to representative at central call center for [facility]. She placed a request for edoxaban for this patient given Afib - need for anticoagulation and Drug interaction of other DOAC - apixaban rivaroxaban and dabigitran with phenobarbital. The request goes to a pharmacist who will call [facility] pharmacy phone [phone] in the next 24hr    [place].D. CPP, BCPS Clinical Pharmacist [phone] 09/30/2021 2:34 PM

## 2021-09-30 NOTE — Progress Notes (Signed)
Inpatient Rehab Admissions Coordinator:   Met with patient at bedside and he confirms he does wish to return to CIR to improve independence prior to d/c home.  Will send [facility] updated [facility] when available and follow for re-admit pending approval.  Shann Medal, PT, DPT Admissions Coordinator [phone] 09/30/21  4:07 PM

## 2021-09-30 NOTE — Progress Notes (Signed)
PROGRESS NOTE  Ricky Hayden  OZD:[id number] DOB: Sep 30, 1945 DOA: 09/25/2021 PCP: [facility]   Brief Narrative: Patient is a 76 year old male with history of abdominal aortic aneurysm, systolic congestive heart failure,  CKD stage IIIa, coronary artery disease, hypertension, hyperlipidemia, obesity, GERD who presented here initially with complaints of shortness of breath.  Patient was found to be hypoxic in presentation.    On presentation he was hypoxic, put on BiPAP.  He was also febrile so started on antibiotics.  Hospital course remarkable for worsening mental status, hypoxemia.  He was transferred to the ICU, intubated.  Went into short PA arrest after intubation, became hypotensive, had to be started on pressors.  Patient was then extubated, became  hemodynamically stable and was discharged to CIR.  On 5/25 at CIR, he was found to have rhythm change, found to be in A-fib with hypotension.  Patient was then admitted to ICU, cardiology consulted.  Underwent cardioversion.  Currently hemodynamically stable.  Ultimate plan for discharge to a skilled nursing facility as per PT/OT.  Consideration for CIR as well.  TOC/CIR following.  Patient is medically stable for discharge as soon as bed is available at [facility] or SNF.  Assessment & Plan:  Principal Problem:   A-fib ([place]) Active Problems:   Hyperlipidemia   GERD   Morbid obesity ([place])   Debility   A-flutter/A-fib with RVR: Status post cardioversion.  Currently on oral amiodarone.  Currently on Lovenox for stroke prevention due to phenobarbital use, cannot be on  Eliquis.  Currently on low-dose Coreg.  Cardiology following.  While he was in PCCM service, plan was to switch him to edoxaban/checking if [facility] will approve  Chronic systolic congestive heart failure: Currently on losartan, Coreg, Jardiance.  Cardiology planning to put him back on Entresto if blood pressure permits.Has a pacemaker.  Chronic hypoxic respiratory failure/acute  exacerbation of COPD: Currently on baseline oxygen requirement.  Started on steroids by PCCM.  Continue bronchodilators. On his last admission, he was intubated and sent to ICU.  Currently respiratory status stable.  He is at on oxygen at home on 2 L as needed.  Currently on 2 L of oxygen per minute.  History of nephrolithiasis: Follows with urology .  Needs outpatient follow-up for cystoscopy  Type of diabetes: Continue current insulin regimen.  Monitor blood sugars.  On Jardiance  Stage III CKD: Baseline creatinine around 1.3-1.6.  Currently kidney function at baseline.  History of BPH: On Proscar.  Seizure disorder: On phenobarbital since 1995.  Currently seizure-free. Continue.  Follow-up with neurology as an outpatient  Morbid obesity: BMI of 47.  Septic shock/gram-negative bacteremia/complicated UTI: Was admitted here on 4/27, found to have Proteus Mirabella's bacteremia..  Completed 10 days course of antibiotics.  Currently hemodynamically stable  Debility/deconditioning: Patient seen by PT and OT, recommended skilled nursing facility on discharge.  He was recently sent from here to CIR  Goals of care: Multiple comorbidities.  Palliative care was consulted, plan is to remain as full code          Pressure Injury 09/09/21 Buttocks Medial;Bilateral Deep Tissue Pressure Injury - Purple or maroon localized area of discolored intact skin or blood-filled blister due to damage of underlying soft tissue from pressure and/or shear. purple/red with petechi (Active)  09/09/21 2100  Location: Buttocks  Location Orientation: Medial;Bilateral  Staging: Deep Tissue Pressure Injury - Purple or maroon localized area of discolored intact skin or blood-filled blister due to damage of underlying soft tissue from pressure and/or  shear.  Wound Description (Comments): purple/red with petechiae  Present on Admission: No  Dressing Type Foam - Lift dressing to assess site every shift 09/29/21 2110      Pressure Injury 09/17/21 Anus Medial Stage 2 -  Partial thickness loss of dermis presenting as a shallow open injury with a red, pink wound bed without slough. (Active)  09/17/21 1115  Location: Anus  Location Orientation: Medial  Staging: Stage 2 -  Partial thickness loss of dermis presenting as a shallow open injury with a red, pink wound bed without slough.  Wound Description (Comments):   Present on Admission:   Dressing Type Foam - Lift dressing to assess site every shift 09/29/21 2110    DVT prophylaxis:SCDs Start: 09/25/21 1508     Code Status: Full Code  Family Communication: None at the bedside.  Patient says no need to call anybody as they have been updated well  Patient status:Inpatient  Patient is from :CIR  Anticipated discharge to:SNF versus CIR  Estimated DC date: As soon as bed is available   Consultants: Cardiology, PCCM  Procedures: Cardioversion  Antimicrobials:  Anti-infectives (From admission, onward)    None       Subjective: Patient seen and examined the bedside this morning.  Hemodynamically stable.  Lying in bed.  No new complaints.  Remains comfortable.  I encouraged him to ambulate if possible or sit in the chair.  Objective: Vitals:   09/30/21 0411 09/30/21 0434 09/30/21 0904 09/30/21 0935  BP: 133/80   115/75  Pulse:    67  Resp: 18     Temp: 97.8 F (36.6 C)     TempSrc: Oral     SpO2: 100%  100% 100%  Weight:  134 kg      Intake/Output Summary (Last 24 hours) at 09/30/2021 1118 Last data filed at 09/30/2021 0400 Gross per 24 hour  Intake 240 ml  Output 1400 ml  Net -1160 ml   Filed Weights   09/27/21 0500 09/29/21 0440 09/30/21 0434  Weight: 134 kg (!) 137 kg 134 kg    Examination:  General exam: Overall comfortable, not in distress, obese HEENT: PERRL Respiratory system:  no wheezes or crackles  Cardiovascular system: S1 & S2 heard, RRR.  Gastrointestinal system: Abdomen is nondistended, soft and  nontender. Central nervous system: Alert and oriented Extremities: No edema, no clubbing ,no cyanosis Skin: No rashes, no ulcers,no icterus     Data Reviewed: I have personally reviewed following labs and imaging studies  CBC: Recent Labs  Lab 09/25/21 1614 09/26/21 0052 09/27/21 0132 09/30/21 0706  WBC 6.1 5.4 5.0 4.5  HGB 10.4* 10.3* 9.3* 10.0*  HCT 32.0* 31.9* 29.2* 31.1*  MCV 95.0 95.5 96.4 96.6  PLT 169 177 147* 595*   Basic Metabolic Panel: Recent Labs  Lab 09/24/21 0548 09/25/21 0545 09/26/21 0052 09/27/21 0132 09/30/21 0706  NA 138 138 136 140 139  K 3.9 3.9 4.1 4.2 3.9  CL 102 102 102 103 105  CO2 29 29 29 31 31   GLUCOSE 120* 106* 111* 88 82  BUN 49* 44* 36* 26* 13  CREATININE 1.71* 1.50* 1.52* 1.38* 1.14  CALCIUM 9.5 9.6 9.3 9.1 9.4  MG  --   --  2.1 2.0  --   PHOS  --   --  3.8  --   --      No results found for this or any previous visit (from the past 240 hour(s)).   Radiology Studies: No results  found.  Scheduled Meds:  [START ON 10/04/2021] amiodarone  200 mg Oral BID   [START ON 10/11/2021] amiodarone  200 mg Oral Daily   amiodarone  400 mg Oral BID   carvedilol  3.125 mg Oral BID WC   Chlorhexidine Gluconate Cloth  6 each Topical Daily   cholecalciferol  1,000 Units Oral Daily   empagliflozin  10 mg Oral Daily   enoxaparin (LOVENOX) injection  130 mg Subcutaneous Q12H   finasteride  2.5 mg Oral Daily   fluticasone furoate-vilanterol  1 puff Inhalation Daily   insulin aspart  0-15 Units Subcutaneous TID WC   insulin aspart  0-5 Units Subcutaneous QHS   insulin glargine-yfgn  6 Units Subcutaneous Daily   losartan  12.5 mg Oral Daily   mouth rinse  15 mL Mouth Rinse BID   melatonin  3 mg Oral QHS   pantoprazole  40 mg Oral Daily   PHENobarbital  64.8 mg Oral BID   polyethylene glycol  17 g Oral Daily   pravastatin  40 mg Oral QPM   predniSONE  40 mg Oral Q breakfast   umeclidinium bromide  1 puff Inhalation Daily   Continuous  Infusions:   LOS: 5 days   Shelly Coss, MD [facility] P5/30/2023, 11:18 AM

## 2021-09-30 NOTE — Plan of Care (Signed)

## 2021-09-30 NOTE — Progress Notes (Signed)
Physical Therapy Treatment Patient Details Name: Ricky Hayden MRN: [id number] DOB: 29-Mar-1946 Today's Date: 09/30/2021   History of Present Illness Pt is a 76 y.o. male admitted from [facility] on 09/25/21 with unstable afib, hypotension. Underwent TEE/DCCV 5/26. Of note, initial admission to [facility] 08/28/21-09/19/21 with sepsis secondary to nonobstructing nephrolithiasis/pyelonephritis and concurrent UTI, acute hypoxic respiratory failure secondary to volume overload, AKI on CKD; ETT 5/3-5/12. Other PMH includes CHF, ICD, CKF, afib, COPD, seizures.    PT Comments    Pt is progressing well with therapy. He continues to require Mod assist with bed transfers like sit to stands, and min assist for gait. Pt relies on external support of rolling walker and verbal cues necessary for sequencing tasks. He ambulated with 3L/min and maintained sats in 90's. He completed therapy today in pleasant disposition and is highly motivated to progress activity and independence. Pt would benefit from intense rehab at AIR setting to maximize return to PLOF and independence. Acute PT will continue to progress pt as able.    Recommendations for follow up therapy are one component of a multi-disciplinary discharge planning process, led by the attending physician.  Recommendations may be updated based on patient status, additional functional criteria and insurance authorization.  Follow Up Recommendations  Acute inpatient rehab (3hours/day)     Assistance Recommended at Discharge Intermittent Supervision/Assistance  Patient can return home with the following A lot of help with walking and/or transfers;A lot of help with bathing/dressing/bathroom;Assistance with cooking/housework;Assist for transportation;Help with stairs or ramp for entrance   Equipment Recommendations  None recommended by PT    Recommendations for Other Services OT consult     Precautions / Restrictions Precautions Precautions: Fall Precaution Comments:  Watch SpO2 (on 3L O2, does not wear baseline), recent orthostatic hypotension; h/o vertigo with current symptoms Restrictions Weight Bearing Restrictions: No     Mobility  Bed Mobility Overal bed mobility: Needs Assistance Bed Mobility: Supine to Sit     Supine to sit: Supervision, HOB elevated     General bed mobility comments: Pt moved from supine to sit with verbal cueing    Transfers Overall transfer level: Needs assistance Equipment used: Rolling walker (2 wheels) Transfers: Sit to/from Stand, Bed to chair/wheelchair/BSC Sit to Stand: Mod assist   Step pivot transfers: Min assist       General transfer comment: Mod assist to stand from EOB to rolling walker, cues for hand placement with powerup. pt demonstrated normal sequencing to reach back in controlled lowering. Pt wtih good recall with hand placement for the second time and min assist to pivot to recliner.    Ambulation/Gait Ambulation/Gait assistance: Min assist Gait Distance (Feet): 60 Feet Assistive device: Rolling walker (2 wheels) Gait Pattern/deviations: Step-through pattern, Decreased stride length, Trunk flexed, Knees buckling, Wide base of support Gait velocity: decreased     General Gait Details: Pt drifted toward the right, needed to provide min assist and verbal cues to guide his direction during gait. L knee buckled once. Min assist to prevent LOB. Pt on 3 L with SpO2 98   Stairs             Wheelchair Mobility    Modified Rankin (Stroke Patients Only)       Balance Overall balance assessment: Needs assistance Sitting-balance support: No upper extremity supported, Feet supported Sitting balance-Leahy Scale: Good     Standing balance support: Bilateral upper extremity supported, During functional activity, Reliant on assistive device for balance Standing balance-Leahy Scale: Poor  Cognition Arousal/Alertness: Awake/alert Behavior During  Therapy: WFL for tasks assessed/performed Overall Cognitive Status: Within Functional Limits for tasks assessed                                 General Comments: pt was in pleasant mood, asked if he could apologize to previous therapist he felt he mistreated        Exercises      General Comments        Pertinent Vitals/Pain Pain Assessment Pain Assessment: No/denies pain    Home Living Family/patient expects to be discharged to:: Inpatient rehab Living Arrangements: Alone                      Prior Function            PT Goals (current goals can now be found in the care plan section) Acute Rehab PT Goals Patient Stated Goal: ultimate goal is home, pt also requesting less intense rehab at SNF PT Goal Formulation: With patient Time For Goal Achievement: 10/11/21 Potential to Achieve Goals: Good Progress towards PT goals: Progressing toward goals    Frequency    Min 3X/week      PT Plan Current plan remains appropriate    Co-evaluation              AM-PAC PT "6 Clicks" Mobility   Outcome Measure  Help needed turning from your back to your side while in a flat bed without using bedrails?: None Help needed moving from lying on your back to sitting on the side of a flat bed without using bedrails?: A Little Help needed moving to and from a bed to a chair (including a wheelchair)?: A Lot Help needed standing up from a chair using your arms (e.g., wheelchair or bedside chair)?: A Lot Help needed to walk in hospital room?: A Little Help needed climbing 3-5 steps with a railing? : A Lot 6 Click Score: 16    End of Session Equipment Utilized During Treatment: Gait belt;Oxygen Activity Tolerance: Patient tolerated treatment well Patient left: in chair;with call bell/phone within reach Nurse Communication: Mobility status PT Visit Diagnosis: Muscle weakness (generalized) (M62.81);Difficulty in walking, not elsewhere classified  (R26.2);Unsteadiness on feet (R26.81)     Time: 6213-0865 PT Time Calculation (min) (ACUTE ONLY): 34 min  Charges:  $Gait Training: 8-22 mins $Therapeutic Activity: 8-22 mins                     Margie Ege, SPT [place] 09/30/2021, 4:40 PM

## 2021-09-30 NOTE — Anesthesia Postprocedure Evaluation (Signed)
Anesthesia Post Note  Patient: Ricky Hayden  Procedure(s) Performed: CARDIOVERSION TRANSESOPHAGEAL ECHOCARDIOGRAM (TEE)     Patient location during evaluation: Endoscopy Anesthesia Type: MAC Level of consciousness: awake and alert Pain management: pain level controlled Vital Signs Assessment: post-procedure vital signs reviewed and stable Respiratory status: spontaneous breathing, nonlabored ventilation, respiratory function stable and patient connected to nasal cannula oxygen Cardiovascular status: stable and blood pressure returned to baseline Postop Assessment: no apparent nausea or vomiting Anesthetic complications: no   No notable events documented.  Last Vitals:  Vitals:   09/30/21 0935 09/30/21 1152  BP: 115/75 109/63  Pulse: 67   Resp:  18  Temp:  36.6 C  SpO2: 100%     Last Pain:  Vitals:   09/30/21 1152  TempSrc: Oral  PainSc:                  [place]

## 2021-09-30 NOTE — Progress Notes (Addendum)
Progress Note  Patient Name: Ricky Hayden Date of Encounter: 09/30/2021  [facility] Cardiologist: Virl Axe, MD   Subjective   No chest pain and no SOB   Inpatient Medications    Scheduled Meds:  [START ON 10/04/2021] amiodarone  200 mg Oral BID   [START ON 10/11/2021] amiodarone  200 mg Oral Daily   amiodarone  400 mg Oral BID   carvedilol  3.125 mg Oral BID WC   Chlorhexidine Gluconate Cloth  6 each Topical Daily   cholecalciferol  1,000 Units Oral Daily   empagliflozin  10 mg Oral Daily   enoxaparin (LOVENOX) injection  130 mg Subcutaneous Q12H   finasteride  2.5 mg Oral Daily   fluticasone furoate-vilanterol  1 puff Inhalation Daily   insulin aspart  0-15 Units Subcutaneous TID WC   insulin aspart  0-5 Units Subcutaneous QHS   insulin glargine-yfgn  6 Units Subcutaneous Daily   losartan  12.5 mg Oral Daily   mouth rinse  15 mL Mouth Rinse BID   melatonin  3 mg Oral QHS   pantoprazole  40 mg Oral Daily   PHENobarbital  64.8 mg Oral BID   polyethylene glycol  17 g Oral Daily   pravastatin  40 mg Oral QPM   predniSONE  40 mg Oral Q breakfast   umeclidinium bromide  1 puff Inhalation Daily   Continuous Infusions:  PRN Meds: acetaminophen, albuterol, bisacodyl, polyethylene glycol   Vital Signs    Vitals:   09/30/21 0057 09/30/21 0411 09/30/21 0434 09/30/21 0904  BP: 128/74 133/80    Pulse: 70     Resp: 16 18    Temp: 98.2 F (36.8 C) 97.8 F (36.6 C)    TempSrc: Oral Oral    SpO2: 100% 100%  100%  Weight:   134 kg     Intake/Output Summary (Last 24 hours) at 09/30/2021 0944 Last data filed at 09/30/2021 0400 Gross per 24 hour  Intake 240 ml  Output 1400 ml  Net -1160 ml      09/30/2021    4:34 AM 09/29/2021    4:40 AM 09/27/2021    5:00 AM  Last 3 Weights  Weight (lbs) 295 lb 6.7 oz 302 lb 0.5 oz 295 lb 6.7 oz  Weight (kg) 134 kg 137 kg 134 kg      Telemetry    AV pacing and SR with PACs - Personally Reviewed  ECG    No new -  Personally Reviewed  Physical Exam   GEN: No acute distress.   Neck: No JVD Cardiac: RRR, no murmurs, rubs, or gallops.  Respiratory: Clear to auscultation bilaterally.with few crackles GI: Soft, nontender, non-distended  MS: No edema; No deformity. Neuro:  Nonfocal  Psych: Normal affect   Labs    High Sensitivity Troponin:   Recent Labs  Lab 09/03/21 0850  TROPONINIHS 35*     Chemistry Recent Labs  Lab 09/26/21 0052 09/27/21 0132 09/30/21 0706  NA 136 140 139  K 4.1 4.2 3.9  CL 102 103 105  CO2 29 31 31   GLUCOSE 111* 88 82  BUN 36* 26* 13  CREATININE 1.52* 1.38* 1.14  CALCIUM 9.3 9.1 9.4  MG 2.1 2.0  --   GFRNONAA 47* 53* >60  ANIONGAP 5 6 3*    Lipids No results for input(s): CHOL, TRIG, HDL, LABVLDL, LDLCALC, CHOLHDL in the last 168 hours.  Hematology Recent Labs  Lab 09/26/21 0052 09/27/21 0132 09/30/21 0706  WBC 5.4 5.0  4.5  RBC 3.34* 3.03* 3.22*  HGB 10.3* 9.3* 10.0*  HCT 31.9* 29.2* 31.1*  MCV 95.5 96.4 96.6  MCH 30.8 30.7 31.1  MCHC 32.3 31.8 32.2  RDW 13.7 13.9 14.0  PLT 177 147* 115*   Thyroid No results for input(s): TSH, FREET4 in the last 168 hours.  BNPNo results for input(s): BNP, PROBNP in the last 168 hours.  DDimer No results for input(s): DDIMER in the last 168 hours.   Radiology    No results found.  Cardiac Studies     Decreased EF; s/p TEE CV  TEE 09/26/21: IMPRESSIONS     1. Left ventricular ejection fraction, by estimation, is 30 to 35%. The  left ventricle has moderately decreased function. The left ventricle  demonstrates global hypokinesis.   2. Right ventricular systolic function is normal. The right ventricular  size is normal.   3. No left atrial/left atrial appendage thrombus was detected.   4. The mitral valve is normal in structure. Trivial mitral valve  regurgitation. No evidence of mitral stenosis.   5. The aortic valve is tricuspid. Aortic valve regurgitation is trivial.  No aortic stenosis is  present.   6. The inferior vena cava is normal in size with greater than 50%  respiratory variability, suggesting right atrial pressure of 3 mmHg.   Conclusion(s)/Recommendation(s): Normal biventricular function without  evidence of hemodynamically significant valvular heart disease.   Patient Profile     76 y.o. 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 Course complicated by acute hypoxic respiratory failure s/p intubation with PEA arrest following intubation and Aflutter with RVR   Assessment & Plan    #Aflutter/Afib with RVR: Patients course was complicated by Aflutter with RVR with associated hypotension. Placed on amiodarone and ultimately underwent TEE/DCCV on 09/26/21. Was previously on apixaban which was changed due to lovenox as phenobarbital decreases effectiveness of apixaban.  -Continue amiodarone 400mg  BID x7 days, 200mg  BID x 7days and then 200mg  daily thereafter -Continue therapeutic lovenox -BB added yesterday -Attempting to try to call [facility] about Edoxaban   #Chronic Systolic Heart Failure: #S/p BiV [employer] ICD: TTE with LVEF 40-45% on TTE, but dropped to 30-35% on TEE 5/26. Likely worsened in the setting of Aflutter with RVR as well as sepsis. GDMT initially held due to hypotension which is now improved.  -Add back low dose losartan 12.5mg  daily; will transition back to home entresto as able  BP stable 133/80 to 115/75 ? Today vs tomorrow defer to Dr. Sallyanne Kuster  Cr 1.14 -Resumed home jardiance 10mg  daily -on coreg at 3.125  home dose of 6.25    #Chronic Hypoxic Respiratory Failure: #COPD on Home O2: -On 2L [place] chronically now on 3L      For questions or updates, please contact [facility] Please consult [email or web address]  for contact info under        Signed, Cecilie Kicks, NP  09/30/2021, 9:44 AM    I have seen and examined the patient along with Cecilie Kicks, NP .  I have reviewed the chart, notes and new data.  I agree with PA/NP's note.  Key new complaints: No cardiovascular complaints Key examination changes: Lying fully flat in bed without any respiratory difficulty, no rales, no JVD, no edema Key new findings / data: Blood  pressure is gradually improving.  I think he is ready to transition back to [facility] tomorrow.  PLAN: Maintaining sinus rhythm/atrial paced rhythm. Our clinical pharmacist has attempted to reach the [facility] to see if we can provide treatment long-term with edoxaban, rather than enoxaparin.  So far we have not been able to get through to them. Hopefully by the time of discharge will be able to get him back to all his chronic heart failure medications.   Sanda Klein, MD, [place] [phone] 09/30/2021, 11:53 AM

## 2021-09-30 NOTE — Progress Notes (Signed)
Daily Progress Note   Patient Name: Ricky Hayden       Date: 09/30/2021 DOB: 1945-11-15  Age: 76 y.o. MRN#: [id number] Attending Physician: Shelly Coss, MD Primary Care Physician: [facility] Admit Date: 09/25/2021  Reason for Consultation/Follow-up: Establishing goals of care  Subjective: Medical records reviewed including progress notes, labs. Patient assessed at the bedside.  He is still feeling tired today, about to begin his session with PT.  Reviewed plan to proceed with CIR review and patient agrees this is what he wishes.  I informed patient that I will not be back until until next Monday but that a PMT provider will continue to provide support intermittently.  I then called patient's daughter [name], who tells me patient is under the impression that he will be discharging to CIR tomorrow.  We discussed that he still needs to be approved after PT note is completed and sent for review.  This will likely take at least another couple of days.  She notes that she has seen low platelet counts on patient's labs and that she would like to speak with his attending about management of this, as it was previously a concern in the ICU.  We discussed ongoing support for the patient to assist with fostering trust in the staff and she would like for continued palliative support in the coming days and and CIR.  Questions and concerns addressed. PMT will continue to support holistically.   Length of Stay: 5  Physical Exam Vitals and nursing note reviewed.  Constitutional:      General: He is not in acute distress.    Appearance: He is obese.     Interventions: Nasal cannula in place.  Cardiovascular:     Rate and Rhythm: Normal rate.  Pulmonary:     Effort: Pulmonary effort is  normal.  Neurological:     Mental Status: He is alert.     Motor: Weakness present.  Psychiatric:        Mood and Affect: Mood normal.        Behavior: Behavior is cooperative.            Vital Signs: BP 109/63 (BP Location: Right Arm)   Pulse 67   Temp 97.9 F (36.6 C) (Oral)   Resp 18   Ht 5\' 7"  (1.702 m)  Wt 134 kg   SpO2 100%   BMI 46.27 kg/m  SpO2: SpO2: 100 % O2 Device: O2 Device: Nasal Cannula O2 Flow Rate: O2 Flow Rate (L/min): 3 L/min      Palliative Assessment/Data: 40-50%     Palliative Care Assessment & Plan   Patient Profile: 76 y.o. 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   Patient was admitted for sepsis from UTI and required BiPAP in the ED. Patient was weaned off but then on 5/2 had decreased level of consciousness and increased oxygen requirement, placed back on BiPAP, became hypotensive and intubated for worsened mental status. Patient was extubated on 5/12. PMT has been consulted to assist with goals of care conversation.    Assessment: Goals of care conversation Functional deficits secondary to debility from septic shock PPM/ICD CKD COPD CHF Atrial flutter, unstable  Recommendations/Plan: Continue full code/full scope treatment  Patient is hopeful for improvement and approval for readmission to CIR Psychosocial and emotional support provided PMT will continue to follow incrementally   Prognosis:  Unable to determine  Discharge Planning: To Be Determined   Total time: I spent 35 minutes in the care of the patient today in the above activities and documenting the encounter.    Dorthy Cooler, PA-C Palliative Medicine Team Team phone # [phone]  Thank you for allowing the Palliative Medicine Team to assist in the care of this patient.  Please utilize secure chat with additional questions, if there is no response within 30 minutes please call the above phone number.  Palliative Medicine Team providers are available by phone from 7am to 7pm daily and can be reached through the team cell phone.  Should this patient require assistance outside of these hours, please call the patient's attending physician.

## 2021-10-01 ENCOUNTER — Inpatient Hospital Stay (HOSPITAL_COMMUNITY)
Admission: RE | Admit: 2021-10-01 | Discharge: 2021-10-10 | DRG: 945 | Disposition: A | Discharge status: Home Health | Payer: No Typology Code available for payment source | Source: Intra-hospital | Attending: Physical Medicine & Rehabilitation | Admitting: Physical Medicine & Rehabilitation

## 2021-10-01 ENCOUNTER — Encounter (HOSPITAL_COMMUNITY): Payer: Self-pay | Admitting: Physical Medicine & Rehabilitation

## 2021-10-01 ENCOUNTER — Other Ambulatory Visit: Payer: Self-pay

## 2021-10-01 DIAGNOSIS — G40909 Epilepsy, unspecified, not intractable, without status epilepticus: Secondary | ICD-10-CM | POA: Diagnosis present

## 2021-10-01 DIAGNOSIS — I4891 Unspecified atrial fibrillation: Secondary | ICD-10-CM | POA: Diagnosis present

## 2021-10-01 DIAGNOSIS — I5022 Chronic systolic (congestive) heart failure: Secondary | ICD-10-CM | POA: Diagnosis present

## 2021-10-01 DIAGNOSIS — A419 Sepsis, unspecified organism: Secondary | ICD-10-CM

## 2021-10-01 DIAGNOSIS — Z7901 Long term (current) use of anticoagulants: Secondary | ICD-10-CM

## 2021-10-01 DIAGNOSIS — Z7984 Long term (current) use of oral hypoglycemic drugs: Secondary | ICD-10-CM | POA: Diagnosis not present

## 2021-10-01 DIAGNOSIS — R6521 Severe sepsis with septic shock: Secondary | ICD-10-CM

## 2021-10-01 DIAGNOSIS — I480 Paroxysmal atrial fibrillation: Secondary | ICD-10-CM | POA: Diagnosis present

## 2021-10-01 DIAGNOSIS — Z8249 Family history of ischemic heart disease and other diseases of the circulatory system: Secondary | ICD-10-CM | POA: Diagnosis not present

## 2021-10-01 DIAGNOSIS — Z515 Encounter for palliative care: Secondary | ICD-10-CM | POA: Diagnosis not present

## 2021-10-01 DIAGNOSIS — E785 Hyperlipidemia, unspecified: Secondary | ICD-10-CM | POA: Diagnosis present

## 2021-10-01 DIAGNOSIS — R5381 Other malaise: Secondary | ICD-10-CM | POA: Diagnosis present

## 2021-10-01 DIAGNOSIS — J439 Emphysema, unspecified: Secondary | ICD-10-CM | POA: Diagnosis not present

## 2021-10-01 DIAGNOSIS — N1832 Chronic kidney disease, stage 3b: Secondary | ICD-10-CM | POA: Diagnosis present

## 2021-10-01 DIAGNOSIS — R32 Unspecified urinary incontinence: Secondary | ICD-10-CM | POA: Diagnosis not present

## 2021-10-01 DIAGNOSIS — I2510 Atherosclerotic heart disease of native coronary artery without angina pectoris: Secondary | ICD-10-CM | POA: Diagnosis present

## 2021-10-01 DIAGNOSIS — Z95810 Presence of automatic (implantable) cardiac defibrillator: Secondary | ICD-10-CM

## 2021-10-01 DIAGNOSIS — N184 Chronic kidney disease, stage 4 (severe): Secondary | ICD-10-CM

## 2021-10-01 DIAGNOSIS — R7303 Prediabetes: Secondary | ICD-10-CM | POA: Diagnosis not present

## 2021-10-01 DIAGNOSIS — I130 Hypertensive heart and chronic kidney disease with heart failure and stage 1 through stage 4 chronic kidney disease, or unspecified chronic kidney disease: Secondary | ICD-10-CM | POA: Diagnosis present

## 2021-10-01 DIAGNOSIS — N200 Calculus of kidney: Secondary | ICD-10-CM

## 2021-10-01 DIAGNOSIS — N390 Urinary tract infection, site not specified: Secondary | ICD-10-CM | POA: Diagnosis present

## 2021-10-01 DIAGNOSIS — Z7951 Long term (current) use of inhaled steroids: Secondary | ICD-10-CM | POA: Diagnosis not present

## 2021-10-01 DIAGNOSIS — I25810 Atherosclerosis of coronary artery bypass graft(s) without angina pectoris: Secondary | ICD-10-CM | POA: Diagnosis not present

## 2021-10-01 DIAGNOSIS — J449 Chronic obstructive pulmonary disease, unspecified: Secondary | ICD-10-CM | POA: Diagnosis present

## 2021-10-01 DIAGNOSIS — Z888 Allergy status to other drugs, medicaments and biological substances status: Secondary | ICD-10-CM | POA: Diagnosis not present

## 2021-10-01 DIAGNOSIS — K5900 Constipation, unspecified: Secondary | ICD-10-CM | POA: Diagnosis present

## 2021-10-01 DIAGNOSIS — Z79899 Other long term (current) drug therapy: Secondary | ICD-10-CM

## 2021-10-01 DIAGNOSIS — N486 Induration penis plastica: Secondary | ICD-10-CM | POA: Diagnosis present

## 2021-10-01 DIAGNOSIS — N400 Enlarged prostate without lower urinary tract symptoms: Secondary | ICD-10-CM | POA: Diagnosis present

## 2021-10-01 DIAGNOSIS — K219 Gastro-esophageal reflux disease without esophagitis: Secondary | ICD-10-CM | POA: Diagnosis present

## 2021-10-01 DIAGNOSIS — E1122 Type 2 diabetes mellitus with diabetic chronic kidney disease: Secondary | ICD-10-CM | POA: Diagnosis present

## 2021-10-01 DIAGNOSIS — N183 Chronic kidney disease, stage 3 unspecified: Secondary | ICD-10-CM | POA: Diagnosis not present

## 2021-10-01 LAB — CBC
HCT: 32.5 % — ABNORMAL LOW (ref 39.0–52.0)
Hemoglobin: 10.2 g/dL — ABNORMAL LOW (ref 13.0–17.0)
MCH: 30.5 pg (ref 26.0–34.0)
MCHC: 31.4 g/dL (ref 30.0–36.0)
MCV: 97.3 fL (ref 80.0–100.0)
Platelets: 113 10*3/uL — ABNORMAL LOW (ref 150–400)
RBC: 3.34 MIL/uL — ABNORMAL LOW (ref 4.22–5.81)
RDW: 14.3 % (ref 11.5–15.5)
WBC: 5 10*3/uL (ref 4.0–10.5)
nRBC: 0 % (ref 0.0–0.2)

## 2021-10-01 LAB — GLUCOSE, CAPILLARY
Glucose-Capillary: 76 mg/dL (ref 70–99)
Glucose-Capillary: 120 mg/dL — ABNORMAL HIGH (ref 70–99)
Glucose-Capillary: 100 mg/dL — ABNORMAL HIGH (ref 70–99)
Glucose-Capillary: 105 mg/dL — ABNORMAL HIGH (ref 70–99)

## 2021-10-01 LAB — BASIC METABOLIC PANEL
Anion gap: 3 — ABNORMAL LOW (ref 5–15)
Anion gap: 5 (ref 5–15)
BUN: 13 mg/dL (ref 8–23)
BUN: 13 mg/dL (ref 8–23)
CO2: 31 mmol/L (ref 22–32)
CO2: 31 mmol/L (ref 22–32)
Calcium: 9.6 mg/dL (ref 8.9–10.3)
Calcium: 9.4 mg/dL (ref 8.9–10.3)
Chloride: 106 mmol/L (ref 98–111)
Chloride: 102 mmol/L (ref 98–111)
Creatinine, Ser: 1.18 mg/dL (ref 0.61–1.24)
Creatinine, Ser: 1.14 mg/dL (ref 0.61–1.24)
GFR, Estimated: >60 mL/min (ref >60)
GFR, Estimated: >60 mL/min (ref >60)
Glucose, Bld: 84 mg/dL (ref 70–99)
Glucose, Bld: 110 mg/dL — ABNORMAL HIGH (ref 70–99)
Potassium: 4.3 mmol/L (ref 3.5–5.1)
Potassium: 5.2 mmol/L — ABNORMAL HIGH (ref 3.5–5.1)
Sodium: 140 mmol/L (ref 135–145)
Sodium: 138 mmol/L (ref 135–145)

## 2021-10-01 MED ORDER — EMPAGLIFLOZIN 10 MG PO TABS
10.0000 mg | ORAL_TABLET | Freq: Every day | ORAL | Status: DC
  Administered 2021-10-02 – 2021-10-10 (×9): 10 mg via ORAL
  Filled 2021-10-01 (×9): qty 1

## 2021-10-01 MED ORDER — VITAMIN D3 25 MCG PO TABS: 1000.0000 [IU] | ORAL_TABLET | Freq: Every day | ORAL | Status: DC

## 2021-10-01 MED ORDER — VITAMIN D 25 MCG (1000 UNIT) PO TABS
1000.0000 [IU] | ORAL_TABLET | Freq: Every day | ORAL | Status: DC
  Administered 2021-10-02 – 2021-10-10 (×9): 1000 [IU] via ORAL
  Filled 2021-10-01 (×10): qty 1

## 2021-10-01 MED ORDER — POLYETHYLENE GLYCOL 3350 17 G PO PACK: 17.0000 g | PACK | Freq: Every day | ORAL | Status: DC | PRN

## 2021-10-01 MED ORDER — AMIODARONE HCL 200 MG PO TABS
400.0000 mg | ORAL_TABLET | Freq: Two times a day (BID) | ORAL | Status: AC
Start: 2021-10-01 — End: 2021-10-05
  Administered 2021-10-01 – 2021-10-05 (×9): 400 mg via ORAL
  Filled 2021-10-01 (×9): qty 2

## 2021-10-01 MED ORDER — ENOXAPARIN SODIUM 150 MG/ML IJ SOSY
130.0000 mg | PREFILLED_SYRINGE | Freq: Two times a day (BID) | INTRAMUSCULAR | Status: DC
  Administered 2021-10-01 – 2021-10-05 (×9): 130 mg via SUBCUTANEOUS
  Filled 2021-10-01 (×10): qty 0.86

## 2021-10-01 MED ORDER — AMIODARONE HCL 200 MG PO TABS
400.0000 mg | ORAL_TABLET | Freq: Two times a day (BID) | ORAL | Status: DC
Start: 2021-10-01 — End: 2021-10-01

## 2021-10-01 MED ORDER — INSULIN ASPART 100 UNIT/ML IJ SOLN
0.0000 [IU] | Freq: Three times a day (TID) | INTRAMUSCULAR | Status: DC
  Administered 2021-10-02: 2 [IU] via SUBCUTANEOUS

## 2021-10-01 MED ORDER — WARFARIN - PHARMACIST DOSING INPATIENT: Freq: Every day | Status: DC

## 2021-10-01 MED ORDER — ALUM & MAG HYDROXIDE-SIMETH 200-200-20 MG/5ML PO SUSP: 30.0000 mL | ORAL | Status: DC | PRN

## 2021-10-01 MED ORDER — SACUBITRIL-VALSARTAN 24-26 MG PO TABS: 1.0000 | ORAL_TABLET | Freq: Two times a day (BID) | ORAL | Status: DC

## 2021-10-01 MED ORDER — ALBUTEROL SULFATE (2.5 MG/3ML) 0.083% IN NEBU: 2.5000 mg | INHALATION_SOLUTION | RESPIRATORY_TRACT | Status: DC | PRN

## 2021-10-01 MED ORDER — POLYETHYLENE GLYCOL 3350 17 G PO PACK: 17.0000 g | PACK | Freq: Every day | ORAL | Status: DC

## 2021-10-01 MED ORDER — CARVEDILOL 3.125 MG PO TABS
3.1250 mg | ORAL_TABLET | Freq: Two times a day (BID) | ORAL | Status: DC
  Administered 2021-10-01 – 2021-10-02 (×2): 3.125 mg via ORAL
  Filled 2021-10-01 (×2): qty 1

## 2021-10-01 MED ORDER — FLUTICASONE FUROATE-VILANTEROL 200-25 MCG/ACT IN AEPB
1.0000 | INHALATION_SPRAY | Freq: Every day | RESPIRATORY_TRACT | Status: DC
  Administered 2021-10-02 – 2021-10-10 (×7): 1 via RESPIRATORY_TRACT
  Filled 2021-10-01: qty 28

## 2021-10-01 MED ORDER — AMIODARONE HCL 200 MG PO TABS: 200.0000 mg | ORAL_TABLET | Freq: Every day | ORAL | Status: DC

## 2021-10-01 MED ORDER — INSULIN GLARGINE-YFGN 100 UNIT/ML ~~LOC~~ SOLN
6.0000 [IU] | Freq: Every day | SUBCUTANEOUS | Status: DC
Start: 2021-10-02 — End: 2021-10-02
  Administered 2021-10-02: 6 [IU] via SUBCUTANEOUS
  Filled 2021-10-01 (×4): qty 0.06

## 2021-10-01 MED ORDER — AMIODARONE HCL 400 MG PO TABS: 400.0000 mg | ORAL_TABLET | Freq: Two times a day (BID) | ORAL | 0 refills | Status: DC

## 2021-10-01 MED ORDER — CARVEDILOL 3.125 MG PO TABS: 3.1250 mg | ORAL_TABLET | Freq: Two times a day (BID) | ORAL | Status: DC

## 2021-10-01 MED ORDER — PHENOBARBITAL 32.4 MG PO TABS
64.8000 mg | ORAL_TABLET | Freq: Two times a day (BID) | ORAL | Status: DC
  Administered 2021-10-02 – 2021-10-10 (×18): 64.8 mg via ORAL
  Filled 2021-10-01 (×18): qty 2

## 2021-10-01 MED ORDER — MELATONIN 3 MG PO TABS
3.0000 mg | ORAL_TABLET | Freq: Every day | ORAL | Status: DC
  Administered 2021-10-01 – 2021-10-09 (×9): 3 mg via ORAL
  Filled 2021-10-01 (×9): qty 1

## 2021-10-01 MED ORDER — GUAIFENESIN-DM 100-10 MG/5ML PO SYRP: 5.0000 mL | ORAL_SOLUTION | Freq: Four times a day (QID) | ORAL | Status: DC | PRN

## 2021-10-01 MED ORDER — SACUBITRIL-VALSARTAN 24-26 MG PO TABS
1.0000 | ORAL_TABLET | Freq: Two times a day (BID) | ORAL | Status: DC
  Administered 2021-10-01: 1 via ORAL
  Filled 2021-10-01: qty 1

## 2021-10-01 MED ORDER — PREDNISONE 20 MG PO TABS
40.0000 mg | ORAL_TABLET | Freq: Every day | ORAL | Status: AC
  Administered 2021-10-02: 40 mg via ORAL
  Filled 2021-10-01: qty 2

## 2021-10-01 MED ORDER — PRAVASTATIN SODIUM 40 MG PO TABS
40.0000 mg | ORAL_TABLET | Freq: Every evening | ORAL | Status: DC
  Administered 2021-10-01 – 2021-10-09 (×9): 40 mg via ORAL
  Filled 2021-10-01 (×9): qty 1

## 2021-10-01 MED ORDER — ACETAMINOPHEN 325 MG PO TABS
325.0000 mg | ORAL_TABLET | ORAL | Status: DC | PRN
  Administered 2021-10-02 (×2): 650 mg via ORAL
  Filled 2021-10-01 (×4): qty 2

## 2021-10-01 MED ORDER — PREDNISONE 20 MG PO TABS: 40.0000 mg | ORAL_TABLET | Freq: Every day | ORAL | 0 refills | Status: DC

## 2021-10-01 MED ORDER — BISACODYL 10 MG RE SUPP: 10.0000 mg | Freq: Every day | RECTAL | Status: DC | PRN

## 2021-10-01 MED ORDER — AMIODARONE HCL 200 MG PO TABS: 200.0000 mg | ORAL_TABLET | Freq: Two times a day (BID) | ORAL | Status: DC

## 2021-10-01 MED ORDER — POLYETHYLENE GLYCOL 3350 17 G PO PACK: 17.0000 g | PACK | Freq: Every day | ORAL | 0 refills | Status: DC

## 2021-10-01 MED ORDER — POLYETHYLENE GLYCOL 3350 17 G PO PACK
17.0000 g | PACK | Freq: Two times a day (BID) | ORAL | Status: DC
  Administered 2021-10-02 – 2021-10-10 (×10): 17 g via ORAL
  Filled 2021-10-01 (×18): qty 1

## 2021-10-01 MED ORDER — WARFARIN SODIUM 5 MG PO TABS: 7.5000 mg | ORAL_TABLET | Freq: Once | ORAL | Status: DC

## 2021-10-01 MED ORDER — SACUBITRIL-VALSARTAN 24-26 MG PO TABS
1.0000 | ORAL_TABLET | Freq: Two times a day (BID) | ORAL | Status: DC
  Administered 2021-10-01 – 2021-10-10 (×18): 1 via ORAL
  Filled 2021-10-01 (×18): qty 1

## 2021-10-01 MED ORDER — ENOXAPARIN SODIUM 150 MG/ML IJ SOSY: 130.0000 mg | PREFILLED_SYRINGE | Freq: Two times a day (BID) | INTRAMUSCULAR | Status: DC

## 2021-10-01 MED ORDER — PROCHLORPERAZINE EDISYLATE 10 MG/2ML IJ SOLN: 5.0000 mg | Freq: Four times a day (QID) | INTRAMUSCULAR | Status: DC | PRN

## 2021-10-01 MED ORDER — DIPHENHYDRAMINE HCL 12.5 MG/5ML PO ELIX: 12.5000 mg | ORAL_SOLUTION | Freq: Four times a day (QID) | ORAL | Status: DC | PRN

## 2021-10-01 MED ORDER — TRAZODONE HCL 50 MG PO TABS
25.0000 mg | ORAL_TABLET | Freq: Every evening | ORAL | Status: DC | PRN
  Filled 2021-10-01: qty 1

## 2021-10-01 MED ORDER — INSULIN ASPART 100 UNIT/ML IJ SOLN: 0.0000 [IU] | Freq: Every day | INTRAMUSCULAR | Status: DC

## 2021-10-01 MED ORDER — PROCHLORPERAZINE MALEATE 5 MG PO TABS: 5.0000 mg | ORAL_TABLET | Freq: Four times a day (QID) | ORAL | Status: DC | PRN

## 2021-10-01 MED ORDER — AMIODARONE HCL 200 MG PO TABS
200.0000 mg | ORAL_TABLET | Freq: Two times a day (BID) | ORAL | Status: DC
  Administered 2021-10-07 – 2021-10-10 (×7): 200 mg via ORAL
  Filled 2021-10-01 (×7): qty 1

## 2021-10-01 MED ORDER — FINASTERIDE 2.5 MG HALF TABLET
2.5000 mg | ORAL_TABLET | Freq: Every day | ORAL | Status: DC
  Administered 2021-10-02 – 2021-10-10 (×9): 2.5 mg via ORAL
  Filled 2021-10-01 (×9): qty 1

## 2021-10-01 MED ORDER — PANTOPRAZOLE SODIUM 40 MG PO TBEC
40.0000 mg | DELAYED_RELEASE_TABLET | Freq: Every day | ORAL | Status: DC
  Administered 2021-10-02 – 2021-10-10 (×9): 40 mg via ORAL
  Filled 2021-10-01 (×9): qty 1

## 2021-10-01 MED ORDER — FLEET ENEMA 7-19 GM/118ML RE ENEM: 1.0000 | ENEMA | Freq: Once | RECTAL | Status: DC | PRN

## 2021-10-01 MED ORDER — AMIODARONE HCL 200 MG PO TABS: 200.0000 mg | ORAL_TABLET | Freq: Two times a day (BID) | ORAL | 0 refills | Status: DC

## 2021-10-01 MED ORDER — PROCHLORPERAZINE 25 MG RE SUPP: 12.5000 mg | Freq: Four times a day (QID) | RECTAL | Status: DC | PRN

## 2021-10-01 MED ORDER — WARFARIN SODIUM 5 MG PO TABS: 5.0000 mg | ORAL_TABLET | Freq: Every day | ORAL | 11 refills | Status: DC

## 2021-10-01 MED ORDER — UMECLIDINIUM BROMIDE 62.5 MCG/ACT IN AEPB
1.0000 | INHALATION_SPRAY | Freq: Every day | RESPIRATORY_TRACT | Status: DC
  Administered 2021-10-03 – 2021-10-10 (×6): 1 via RESPIRATORY_TRACT
  Filled 2021-10-01: qty 7

## 2021-10-01 NOTE — Progress Notes (Signed)
PMR Admission Coordinator Pre-Admission Assessment  Patient: Ricky Hayden is an 76 y.o., male MRN: [id number] DOB: 12/01/1945 Height: 5' 7" (170.2 cm) Weight: 134 kg  IInsurance Information HMO:     PPO:      PCP:      IPA:      80/20:      OTHER:  PRIMARY: [facility]     Policy#: [id number]      Subscriber: pt CM Name: Cassie      Phone#: [phone]     Fax#: [phone] Pre-Cert#: [id number] auth for CIR from Cassie with [facility] with updates due to fax listed above on 10/24/21        Employer:  Benefits:  Phone #: [phone]     Name:  Eff. Date: 09/25/2017     Deduct: $0      Out of Pocket Max: $0      Life Max:  CIR: 100%      SNF: 100% Outpatient: 100%     Co-Pay:  Home Health: 100%      Co-Pay:  DME: 100%     Co-Pay:  Providers:  SECONDARY: Medicare Part A      Policy#: [id number]     Phone#:    Financial Counselor:       Phone#:   The "Data Collection Information Summary" for patients in Inpatient Rehabilitation Facilities with attached "Privacy Act Statement-Health Care Records" was provided and verbally reviewed with: Patient and Family  Emergency Contact Information Contact Information     Name Relation Home Work Mobile   Eades,Amanda Daughter   [phone]   Harris,Ashley Daughter   [phone]   Dechert,Derrick Son [phone]         Current Medical History  Patient Admitting Diagnosis: debility  History of Present Illness: Pt is a 76 y/o male with PMH of combined systolic/diastolic CHF with ICD, atrial fibrillation, CKD III, COPD, and seizures, who presented to [facility] on 4/27 with worsening chills, fevers, and SOB.  He initially required bipap, tmax 103 in ED with UA showing features concerning for UTI so started on empiric antibiotics.  Cultures grew Proteus, ID consulted.  Pt developed septic shock with worsening respiratory status and fluid overload, transferred to ICU on 5/2 for intubation and had brief PEA arrest with quick ROSC.  Pt was extubated on  5/12.  Antibiotics completed.  Hospital course further complicated by AKI.  Pt was admitted to CIR on 5/19.  While on rehab pt demonstrated bouts of lethargy and delirium.  Maintained on dysphagia diet.  On 5/24 he developed hypotension with SBP in the 70s, as well as wide-complex tachycardia with HR of 130.  Dr. Skains was consulted and recommended loading dose of amio and fluid boluses.  He was transferred back to acute services on 5/25 for DCCV on 5/26.  Therapy ongoing and pt was recommended to return to CIR to complete program prior to d/c home.     Patient's medical record from [facility] has been reviewed by the rehabilitation admission coordinator and physician.  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 Nudelman   Nocturia    Obesity    Pansinusitis    a.  complicated by brain abscess and bleeding requiring craniotomy in 1995.   PONV (postoperative nausea and vomiting)    Sinusitis    s/p ethmoidectomy and nasal septoplasty   Vertigo    intermitantly    Has the patient had major surgery during 100 days prior to admission? No  Family History   family history includes Heart attack (age of onset: 58) in his father; Heart failure (age of onset: 92) in his  mother.  Current Medications  Current Facility-Administered Medications:    acetaminophen (TYLENOL) tablet 325-650 mg, 325-650 mg, Oral, Q4H PRN, [place], Tiffany, MD   albuterol (PROVENTIL) (2.5 MG/3ML) 0.083% nebulizer solution 2.5 mg, 2.5 mg, Nebulization, Q2H PRN, [place], Tiffany, MD   [START ON 10/04/2021] amiodarone (PACERONE) tablet 200 mg, 200 mg, Oral, BID, Pemberton, Heather E, MD   [START ON 10/11/2021] amiodarone (PACERONE) tablet 200 mg, 200 mg, Oral, Daily, Pemberton, Heather E, MD   amiodarone (PACERONE) tablet 400 mg, 400 mg, Oral, BID, Pemberton, Heather E, MD, 400 mg at 10/01/21 0858   bisacodyl (DULCOLAX) suppository 10 mg, 10 mg, Rectal, Daily PRN, [place], Tiffany, MD   carvedilol (COREG) tablet 3.125 mg, 3.125 mg, Oral, BID WC, Varanasi, Jayadeep S, MD, 3.125 mg at 10/01/21 0859   cholecalciferol (VITAMIN D3) tablet 1,000 Units, 1,000 Units, Oral, Daily, [place], Tiffany, MD, 1,000 Units at 10/01/21 0859   empagliflozin (JARDIANCE) tablet 10 mg, 10 mg, Oral, Daily, Pemberton, Heather E, MD, 10 mg at 10/01/21 0859   enoxaparin (LOVENOX) injection 130 mg, 130 mg, Subcutaneous, Q12H, [place], Tiffany, MD, 130 mg at 10/01/21 0903   finasteride (PROSCAR) tablet 2.5 mg, 2.5 mg, Oral, Daily, [place], Tiffany, MD, 2.5 mg at 10/01/21 0902   fluticasone furoate-vilanterol (BREO ELLIPTA) 200-25 MCG/ACT 1 puff, 1 puff, Inhalation, Daily, Smith, Daniel C, MD, 1 puff at 10/01/21 0858   insulin aspart (novoLOG) injection 0-15 Units, 0-15 Units, Subcutaneous, TID WC, [place], Tiffany, MD, 2 Units at 09/29/21 1736   insulin aspart (novoLOG) injection 0-5 Units, 0-5 Units, Subcutaneous, QHS, [place], Tiffany, MD   insulin glargine-yfgn (SEMGLEE) injection 6 Units, 6 Units, Subcutaneous, Daily, [place], Tiffany, MD, 6 Units at 10/01/21 0902   MEDLINE mouth rinse, 15 mL, Mouth Rinse, BID, [place], Tiffany, MD, 15 mL at 10/01/21 0916   melatonin tablet 3 mg, 3 mg, Oral, QHS, [place],  Tiffany, MD, 3 mg at 09/30/21 2206   pantoprazole (PROTONIX) EC tablet 40 mg, 40 mg, Oral, Daily, [place], Tiffany, MD, 40 mg at 10/01/21 0859   PHENobarbital (LUMINAL) tablet 64.8 mg, 64.8 mg, Oral, BID, [place], Tiffany, MD, 64.8 mg at 10/01/21 0859   polyethylene glycol (MIRALAX / GLYCOLAX) packet 17 g, 17 g, Oral, Daily PRN, Marie, Tiffany, MD, 17 g at 09/26/21 0947   polyethylene glycol (MIRALAX / GLYCOLAX) packet 17 g, 17 g, Oral, Daily, , Tiffany, MD, 17 g at 10/01/21 0900   pravastatin (PRAVACHOL) tablet 40 mg, 40 mg, Oral, QPM, , Tiffany, MD,   40 mg at 09/30/21 1639   predniSONE (DELTASONE) tablet 40 mg, 40 mg, Oral, Q breakfast, Smith, Daniel C, MD, 40 mg at 10/01/21 0859   sacubitril-valsartan (ENTRESTO) 24-26 mg per tablet, 1 tablet, Oral, BID, Zhao, Xika, NP, 1 tablet at 10/01/21 0858   umeclidinium bromide (INCRUSE ELLIPTA) 62.5 MCG/ACT 1 puff, 1 puff, Inhalation, Daily, Smith, Daniel C, MD, 1 puff at 10/01/21 0858   Warfarin - Pharmacist Dosing Inpatient, , Does not apply, q1600, Adhikari, Amrit, MD  Patients Current Diet:  Diet Order             DIET DYS 3 Room service appropriate? Yes; Fluid consistency: Thin  Diet effective now                   Precautions / Restrictions Precautions Precautions: Fall Precaution Comments: Watch SpO2 (on 3L O2, does not wear baseline), recent orthostatic hypotension; h/o vertigo with current symptoms Restrictions Weight Bearing Restrictions: No   Has the patient had 2 or more falls or a fall with injury in the past year? No  Prior Activity Level Limited Community (1-2x/wk): mod I with rollator, driving  Prior Functional Level Self Care: Did the patient need help bathing, dressing, using the toilet or eating? Independent  Indoor Mobility: Did the patient need assistance with walking from room to room (with or without device)? Independent  Stairs: Did the patient need assistance with internal or external  stairs (with or without device)? Independent  Functional Cognition: Did the patient need help planning regular tasks such as shopping or remembering to take medications? Independent  Patient Information Are you of Hispanic, Latino/a,or Spanish origin?: A. No, not of Hispanic, Latino/a, or Spanish origin What is your race?: A. White Do you need or want an interpreter to communicate with a doctor or health care staff?: 0. No  Patient's Response To:  Health Literacy and Transportation Is the patient able to respond to health literacy and transportation needs?: Yes Health Literacy - How often do you need to have someone help you when you read instructions, pamphlets, or other written material from your doctor or pharmacy?: Never In the past 12 months, has lack of transportation kept you from medical appointments or from getting medications?: No In the past 12 months, has lack of transportation kept you from meetings, work, or from getting things needed for daily living?: No  Home Assistive Devices / Equipment Home Assistive Devices/Equipment: Walker (specify type), Bedside commode/3-in-1 Home Equipment: Rollator (4 wheels), Hospital bed, Shower seat  Prior Device Use: Indicate devices/aids used by the patient prior to current illness, exacerbation or injury? Walker  Current Functional Level Cognition  Overall Cognitive Status: Within Functional Limits for tasks assessed Current Attention Level: Selective Orientation Level: Oriented X4 Following Commands: Follows one step commands with increased time Safety/Judgement: Decreased awareness of deficits, Decreased awareness of safety General Comments: pt was in pleasant mood, asked if he could apologize to previous therapist he felt he mistreated    Extremity Assessment (includes Sensation/Coordination)  Upper Extremity Assessment: Generalized weakness RUE Sensation: decreased light touch LUE Sensation: decreased light touch  Lower  Extremity Assessment: Generalized weakness    ADLs  Overall ADL's : Needs assistance/impaired Grooming: Wash/dry hands, Wash/dry face, Oral care, Supervision/safety, Sitting Grooming Details (indicate cue type and reason): sitting at sink Upper Body Bathing: Supervision/ safety, Sitting Upper Body Bathing Details (indicate cue type and reason): sitting at sink Lower Body Bathing: Maximal assistance, Sit to/from stand Upper Body Dressing : Sitting, Minimal   assistance Upper Body Dressing Details (indicate cue type and reason): for changing gown Lower Body Dressing: Maximal assistance, Sit to/from stand Toilet Transfer: Moderate assistance, Stand-pivot, BSC/3in1 General ADL Comments: Patient ambulated to sink for grooming and performed seated due to fatigue. Returned to EOB to address gown change    Mobility  Overal bed mobility: Needs Assistance Bed Mobility: Supine to Sit Sidelying to sit: Min assist Supine to sit: Supervision, HOB elevated Sit to supine: Min assist Sit to sidelying: Mod assist General bed mobility comments: Pt moved from supine to sit with verbal cueing    Transfers  Overall transfer level: Needs assistance Equipment used: Rolling walker (2 wheels) Transfers: Sit to/from Stand, Bed to chair/wheelchair/BSC Sit to Stand: Mod assist Bed to/from chair/wheelchair/BSC transfer type:: Step pivot Stand pivot transfers: Mod assist Step pivot transfers: Min assist General transfer comment: Mod assist to stand from EOB to rolling walker, cues for hand placement with powerup. pt demonstrated normal sequencing to reach back in controlled lowering. Pt wtih good recall with hand placement for the second time and min assist to pivot to recliner.    Ambulation / Gait / Stairs / Wheelchair Mobility  Ambulation/Gait Ambulation/Gait assistance: Min assist Gait Distance (Feet): 60 Feet Assistive device: Rolling walker (2 wheels) Gait Pattern/deviations: Step-through pattern,  Decreased stride length, Trunk flexed, Knees buckling, Wide base of support General Gait Details: Pt drifted toward the right, needed to provide min assist and verbal cues to guide his direction during gait. L knee buckled once. Min assist to prevent LOB. Pt on 3 L with SpO2 98 Gait velocity: decreased    Posture / Balance Dynamic Sitting Balance Sitting balance - Comments: sitting on EOB  upon arrival with daughter doing puzzles Balance Overall balance assessment: Needs assistance Sitting-balance support: No upper extremity supported, Feet supported Sitting balance-Leahy Scale: Good Sitting balance - Comments: sitting on EOB  upon arrival with daughter doing puzzles Standing balance support: Bilateral upper extremity supported, During functional activity, Reliant on assistive device for balance Standing balance-Leahy Scale: Poor Standing balance comment: reliant on BUE support and external assist    Special needs/care consideration Oxygen 2L at noc, Special Bed air mattress overlay, and Diabetic management yes   Previous Home Environment (from acute therapy documentation) Living Arrangements: Alone  Lives With: Alone, Family, Other (Comment) Available Help at Discharge: Family, Available 24 hours/day Type of Home: House Home Layout: One level Home Access: Stairs to enter Entrance Stairs-Rails: None Entrance Stairs-Number of Steps: 1 step onto porch and then step into house Bathroom Shower/Tub: Walk-in shower, Tub/shower unit Bathroom Toilet: Handicapped height Bathroom Accessibility: Yes How Accessible: Accessible via walker Home Care Services: No Additional Comments: children rotate staying overnight  Discharge Living Setting Plans for Discharge Living Setting: Patient's home (4 adult children to assist, plus hired caregivers and [facility] caregivers) Type of Home at Discharge: House Discharge Home Layout: One level Discharge Home Access: Stairs to enter Entrance Stairs-Rails:  None Entrance Stairs-Number of Steps: 3 Discharge Bathroom Shower/Tub: Walk-in shower Discharge Bathroom Toilet: Handicapped height Discharge Bathroom Accessibility: Yes How Accessible: Accessible via walker Does the patient have any problems obtaining your medications?: No  Social/Family/Support Systems Patient Roles: Parent Anticipated Caregiver: 4 adult children involved, Amanda and Ashley are main contacts Anticipated Caregiver's Contact Information: Amanda [phone]; Ashley [phone] Ability/Limitations of Caregiver: supervision only (provided between [facility] caregivers daytimes and childrens nights/weekends) Caregiver Availability: Other (Comment) (working towards 24/7) Discharge Plan Discussed with Primary Caregiver: Yes Is Caregiver In Agreement with Plan?: Yes Does   Caregiver/Family have Issues with Lodging/Transportation while Pt is in Rehab?: No  Goals Patient/Family Goal for Rehab: PT/OT/SLP supervision to mod I Expected length of stay: 10-14 days Additional Information: would benefit from neuropsych Pt/Family Agrees to Admission and willing to participate: Yes Program Orientation Provided & Reviewed with Pt/Caregiver Including Roles  & Responsibilities: Yes  Barriers to Discharge: Insurance for SNF coverage, Decreased caregiver support  Decrease burden of Care through IP rehab admission: n/a  Possible need for SNF placement upon discharge: Not anticipated.  Pt's daughters working to pull together 24/7 support between family and [facility] caregivers, if needed, at discharge.   Patient Condition: I have reviewed medical records from [facility], spoken with  TOC team , and patient and daughter. I met with patient at the bedside and discussed via phone for inpatient rehabilitation assessment.  Patient will benefit from ongoing PT, OT, and SLP, can actively participate in 3 hours of therapy a day 5 days of the week, and can make measurable gains during the admission.  Patient will also  benefit from the coordinated team approach during an Inpatient Acute Rehabilitation admission.  The patient will receive intensive therapy as well as Rehabilitation physician, nursing, social worker, and care management interventions.  Due to bladder management, bowel management, safety, skin/wound care, disease management, medication administration, pain management, and patient education the patient requires 24 hour a day rehabilitation nursing.  The patient is currently min to mod assist with mobility and basic ADLs.  Discharge setting and therapy post discharge at home with home health is anticipated.  Patient has agreed to participate in the Acute Inpatient Rehabilitation Program and will admit today.  Preadmission Screen Completed By:  Shadee Montoya E Pharell Rolfson, PT, DPT 10/01/2021 11:16 AM ______________________________________________________________________   Discussed status with Dr. Shtridelman on 10/01/21  at 11:16 AM  and received approval for admission today.  Admission Coordinator:  Dalylah Ramey E Anas Reister, PT, DPT time 11:16 AM /Date 10/01/21    Assessment/Plan: Diagnosis:deficits secondary to debility from septic shock Does the need for close, 24 hr/day Medical supervision in concert with the patient's rehab needs make it unreasonable for this patient to be served in a less intensive setting? Yes Co-Morbidities requiring supervision/potential complications: Afbit/Aflutter, CHF systolic, COPD, CKD stage 3, BPH, seizure disorder, obesity Due to bladder management, bowel management, safety, skin/wound care, disease management, medication administration, pain management, and patient education, does the patient require 24 hr/day rehab nursing? Yes Does the patient require coordinated care of a physician, rehab nurse, PT, OT, and SLP to address physical and functional deficits in the context of the above medical diagnosis(es)? Yes Addressing deficits in the following areas: balance, endurance, locomotion,  strength, transferring, bowel/bladder control, bathing, dressing, feeding, grooming, toileting, cognition, speech, language, swallowing, and psychosocial support Can the patient actively participate in an intensive therapy program of at least 3 hrs of therapy 5 days a week? Yes The potential for patient to make measurable gains while on inpatient rehab is good Anticipated functional outcomes upon discharge from inpatient rehab: modified independent and supervision PT, modified independent and supervision OT, modified independent and supervision SLP Estimated rehab length of stay to reach the above functional goals is: 10-14 Anticipated discharge destination: Home 10. Overall Rehab/Functional Prognosis: good   MD Signature: Yuri Shtridelman   The potential for patient to make measurable gains while on inpatient rehab is good Anticipated functional outcomes upon discharge from inpatient rehab: modified independent and supervision PT, modified independent and supervision OT, modified independent and supervision SLP Estimated rehab length of stay to reach the above functional goals is: 10-14 Anticipated discharge destination: Home 10. Overall Rehab/Functional Prognosis: good     MD Signature: Jennye Boroughs

## 2021-10-01 NOTE — Progress Notes (Signed)
Inpatient Rehabilitation Admission Medication Review by a Pharmacist  A complete drug regimen review was completed for this patient to identify any potential clinically significant medication issues.  High Risk Drug Classes Is patient taking? Indication by Medication  Antipsychotic No   Anticoagulant Yes Warfarin / Lovenox bridge for PAF  Antibiotic No   Opioid No   Antiplatelet No   Hypoglycemics/insulin Yes Jardiance, Semglee for DM  Vasoactive Medication Yes Amiodarone for PAF, carvedilol, Entresto for CHF  Chemotherapy No   Other Yes Finasteride for BPH Phenobarbital for seizure hx Pravastatin for HLD Incruse for COPD     Type of Medication Issue Identified Description of Issue Recommendation(s)  Drug Interaction(s) (clinically significant)     Duplicate Therapy     Allergy     No Medication Administration End Date     Incorrect Dose     Additional Drug Therapy Needed     Significant med changes from prior encounter (inform family/care partners about these prior to discharge).    Other       Clinically significant medication issues were identified that warrant physician communication and completion of prescribed/recommended actions by midnight of the next day:  No   Pharmacist comments: Amiodarone taper entered  Time spent performing this drug regimen review (minutes):  20 minutes   Tad Moore 10/01/2021 2:38 PM

## 2021-10-01 NOTE — H&P (Signed)
Physical Medicine and Rehabilitation Admission H&P     CC: Debility.      HPI: Ricky Hayden is a 76 year old male with history of Seizure d/o, CKD-baseline SCr 1.5, COPD- oxygen HS, CAD/cardiac arrhthymias s/p ICD/PPM, pre-diabetes;  who was originally admitted on 08/29/21 with Sepsis due to proteus bacteremia and urinary retention and started on Cefazolin but developed respiratory distress requiring intubation followed by brief PEA arrest and aspiration PNA with septic shock and ARF due to ATN.  Antibiotics transitioned to Unasyn with end date of 05/10 and treated with stress dose steriods. He was noted to have generalized weakness with delay in processing and poor awareness of deficits.    He was admitted to CIR on 05/19/123 for intensive rehab program and continued to require supplemental oxygen with bouts of lethargy. Hospital course significant for hypotension with wide complex tachycardia despite loading with amiodarone 400 mg bid. He was transferred to acute hospital on 05/25 and underwent TEE/DCCV on 05/26 to NSR. Stress dose steroids added to help with respiratory status. Coreg added on 05/29 with recommendations to taper amiodarone over next few weeks.  He was extubated to 3 L oxygen and Eliquis changed to coumadin with lovenox bridge to avoid interaction with phenobarbital. BP has stabilized but does have transient orthostatic symptoms, respiratory status. PT/OT has been working with patient and he is showing improvement in mentation but continues to be limited by debility. CIR recommended due to functional decline.  Pt reports very fatigued today. He is happy to be back at CIR. Reports constipation. Reports he is sleeping well.      Review of Systems  Constitutional:  Negative for chills and fever.  HENT:  Negative for hearing loss.   Eyes:  Negative for blurred vision.  Respiratory:  Positive for sputum production.   Cardiovascular:  Positive for leg swelling. Negative for chest  pain.  Gastrointestinal:  Negative for constipation and heartburn.  Genitourinary:  Positive for frequency. Negative for dysuria.  Musculoskeletal:  Negative for myalgias.  Skin:  Negative for rash.  Neurological:  Positive for weakness.  Psychiatric/Behavioral:  The patient is not nervous/anxious and does not have insomnia.           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 Sherwood Gambler   Nocturia     Obesity     Pansinusitis      a.  complicated by brain abscess and bleeding requiring craniotomy in 1995.   PONV (postoperative nausea and vomiting)  Sinusitis      s/p ethmoidectomy and nasal septoplasty   Vertigo      intermitantly           Past Surgical History:  Procedure Laterality Date   BIV UPGRADE N/A 01/29/2020    Procedure: BIV UPGRADE;  Surgeon: Evans Lance, MD;  Location: [facility];  Service: Cardiovascular;  Laterality: N/A;   CARDIOVERSION N/A 09/26/2021    Procedure: CARDIOVERSION;  Surgeon: [name], MD;  Location: [facility];  Service: Cardiovascular;  Laterality: N/A;   CIRCUMCISION N/A 11/20/2013    Procedure: CIRCUMCISION ADULT;  Surgeon: Claybon Jabs, MD;  Location: [facility];  Service: Urology;  Laterality: N/A;   [place]    hematomy due to sinus infection    PACEMAKER IMPLANT N/A 01/17/2020    Procedure: PACEMAKER IMPLANT;  Surgeon: Deboraha Sprang, MD;  Location: [facility];  Service: Cardiovascular;  Laterality: N/A;   RIGHT/LEFT HEART CATH AND CORONARY ANGIOGRAPHY N/A 09/20/2019    Procedure: RIGHT/LEFT HEART CATH AND CORONARY ANGIOGRAPHY;  Surgeon: Jolaine Artist, MD;  Location: [place] [facility];  Service: Cardiovascular;  Laterality: N/A;   shoulder surg rt    1995   SINUS SURGERY WITH INSTATRAK        ethmoidectomy and nasal septum repair   TEE WITHOUT CARDIOVERSION N/A 09/26/2021    Procedure: TRANSESOPHAGEAL ECHOCARDIOGRAM (TEE);  Surgeon: [name], MD;  Location: [facility];  Service: Cardiovascular;  Laterality: N/A;           Family History  Problem Relation Age of Onset   Heart failure Mother 48   Heart attack Father 25      Social History: Lives alone but family stays at nights due to disorientation at nights/fall risk. Family helps with home management and meal prep. He  reports that he quit smoking about 35 years ago. He has never used smokeless tobacco. He reports that he does not drink alcohol and does not use drugs.          Allergies  Allergen Reactions   Calcium Channel Blockers Other (See Comments)      Came to hospital in 1995-caused chest pain              Medications Prior to Admission  Medication Sig Dispense Refill   acetaminophen (TYLENOL) 500 MG tablet Take 1,000 mg by mouth every 6 (six) hours as needed for moderate pain or headache.       albuterol (PROVENTIL HFA;VENTOLIN HFA) 108 (90 BASE) MCG/ACT inhaler Inhale 2 puffs into the lungs every 6 (six) hours as needed for wheezing. 1 Inhaler 11   amiodarone (PACERONE) 200 MG tablet Take 1 tablet (200 mg total) by mouth daily. 90 tablet 3   apixaban (ELIQUIS) 5 MG TABS tablet Take 1 tablet by mouth in the morning and at bedtime.       budesonide-formoterol (SYMBICORT) 80-4.5 MCG/ACT inhaler  Inhale 2 puffs into the lungs 2 (two) times daily. 1 Inhaler 12   carvedilol (COREG) 6.25 MG tablet Take 1 tablet (6.25 mg total) by mouth 2 (two) times daily with a meal. 180 tablet 3   cetirizine (ZYRTEC) 10 MG tablet Take 1 tablet by mouth daily.       diclofenac Sodium (VOLTAREN) 1 % GEL Apply 2 g topically daily as needed (pain).        docusate sodium (COLACE) 100 MG capsule Take 1-2 capsules (100-200 mg total) by mouth 2 (  two) times daily as needed for mild constipation. 180 capsule 3   empagliflozin (JARDIANCE) 10 MG TABS tablet Take 1 tablet (10 mg total) by mouth daily before breakfast. 90 tablet 3   feeding supplement (ENSURE ENLIVE / ENSURE PLUS) LIQD Take 237 mLs by mouth 2 (two) times daily between meals. 237 mL 12   finasteride (PROSCAR) 5 MG tablet TAKE ONE TABLET BY MOUTH ONCE DAILY (Patient taking differently: Take 5 mg by mouth daily.) 30 tablet 11   GINKGO BILOBA PO Take 2 tablets by mouth at bedtime.       insulin glargine-yfgn (SEMGLEE) 100 UNIT/ML injection Inject 0.08 mLs (8 Units total) into the skin daily. 10 mL 11   Multiple Vitamins-Minerals (ICAPS AREDS 2 PO) Take 2 tablets by mouth daily.       omeprazole (PRILOSEC) 20 MG capsule Take 1 capsule (20 mg total) by mouth daily. 30 capsule 11   PHENobarbital (LUMINAL) 64.8 MG tablet Take 1 tablet (64.8 mg total) by mouth 2 (two) times daily. 180 tablet 3   polyethylene glycol (MIRALAX / GLYCOLAX) packet Take 17 g by mouth daily as needed for moderate constipation. 14 each 0   pravastatin (PRAVACHOL) 40 MG tablet Take 1 tablet (40 mg total) by mouth every evening. 90 tablet 3   sacubitril-valsartan (ENTRESTO) 24-26 MG Take 1 tablet by mouth 2 (two) times daily. 180 tablet 3   SPIRIVA HANDIHALER 18 MCG inhalation capsule INHALE ONE DOSE BY MOUTH ONCE DAILY (Patient not taking: Reported on 08/29/2021) 30 capsule 11   tamsulosin (FLOMAX) 0.4 MG CAPS capsule Take 1 capsule (0.4 mg total) by mouth daily. 90 capsule 3   torsemide  (DEMADEX) 20 MG tablet Take 1 tablet (20 mg total) by mouth 2 (two) times daily.       traMADol (ULTRAM) 50 MG tablet Take 1 tablet (50 mg total) by mouth every 6 (six) hours as needed for moderate pain. 30 tablet            Home: Home Living Family/patient expects to be discharged to:: Inpatient rehab Living Arrangements: Alone Available Help at Discharge: Family, Available 24 hours/day Type of Home: House Home Access: Stairs to enter [employer] of Steps: 1 step onto porch and then step into house Entrance Stairs-Rails: None Home Layout: One level Bathroom Shower/Tub: Gaffer, Chiropodist: Handicapped height Bathroom Accessibility: Yes Home Equipment: Rollator (4 wheels), Hospital bed, Shower seat Additional Comments: children rotate staying overnight  Lives With: Alone, Family, Other (Comment)   Functional History: Prior Function Prior Level of Function : Needs assist Mobility Comments: Independent without DME in home and short community distances; use of rollator for longer distances ADLs Comments: Occasional assist from children for ADLs; children mostly manage iADLs   Functional Status:  Mobility: Bed Mobility Overal bed mobility: Needs Assistance Bed Mobility: Supine to Sit Sidelying to sit: Min assist Supine to sit: Supervision, HOB elevated Sit to supine: Min assist Sit to sidelying: Mod assist General bed mobility comments: Pt moved from supine to sit with verbal cueing Transfers Overall transfer level: Needs assistance Equipment used: Rolling walker (2 wheels) Transfers: Sit to/from Stand, Bed to chair/wheelchair/BSC Sit to Stand: Mod assist Bed to/from chair/wheelchair/BSC transfer type:: Step pivot Stand pivot transfers: Mod assist Step pivot transfers: Min assist General transfer comment: Mod assist to stand from EOB to rolling walker, cues for hand placement with powerup. pt demonstrated normal sequencing to reach  back in controlled lowering. Pt wtih good recall with hand  placement for the second time and min assist to pivot to recliner. Ambulation/Gait Ambulation/Gait assistance: Min assist Gait Distance (Feet): 60 Feet Assistive device: Rolling walker (2 wheels) Gait Pattern/deviations: Step-through pattern, Decreased stride length, Trunk flexed, Knees buckling, Wide base of support General Gait Details: Pt drifted toward the right, needed to provide min assist and verbal cues to guide his direction during gait. L knee buckled once. Min assist to prevent LOB. Pt on 3 L with SpO2 98 Gait velocity: decreased   ADL: ADL Overall ADL's : Needs assistance/impaired Grooming: Wash/dry hands, Wash/dry face, Oral care, Supervision/safety, Sitting Grooming Details (indicate cue type and reason): sitting at sink Upper Body Bathing: Supervision/ safety, Sitting Upper Body Bathing Details (indicate cue type and reason): sitting at sink Lower Body Bathing: Maximal assistance, Sit to/from stand Upper Body Dressing : Sitting, Minimal assistance Upper Body Dressing Details (indicate cue type and reason): for changing gown Lower Body Dressing: Maximal assistance, Sit to/from stand Toilet Transfer: Moderate assistance, Stand-pivot, BSC/3in1 General ADL Comments: Patient ambulated to sink for grooming and performed seated due to fatigue. Returned to EOB to address gown change   Cognition: Cognition Overall Cognitive Status: Within Functional Limits for tasks assessed Orientation Level: Oriented X4 Cognition Arousal/Alertness: Awake/alert Behavior During Therapy: WFL for tasks assessed/performed Overall Cognitive Status: Within Functional Limits for tasks assessed Area of Impairment: Attention, Problem solving, Awareness, Safety/judgement Orientation Level: Disoriented to, Time Current Attention Level: Selective Memory: Decreased short-term memory Following Commands: Follows one step commands with increased  time Safety/Judgement: Decreased awareness of deficits, Decreased awareness of safety Awareness: Emergent Problem Solving: Slow processing, Difficulty sequencing, Requires verbal cues, Requires tactile cues, Decreased initiation General Comments: pt was in pleasant mood, asked if he could apologize to previous therapist he felt he mistreated     Blood pressure 136/67, pulse 63, temperature 98.4 F (36.9 C), temperature source Oral, resp. rate 18, height 5\' 7"  (1.702 m), weight 134 kg, SpO2 98 %. Physical Exam   Lab Results Last 48 Hours        Results for orders placed or performed during the hospital encounter of 09/25/21 (from the past 48 hour(s))  Glucose, capillary     Status: Abnormal    Collection Time: 09/29/21  3:40 PM  Result Value Ref Range    Glucose-Capillary 134 (H) 70 - 99 mg/dL      Comment: Glucose reference range applies only to samples taken after fasting for at least 8 hours.  Glucose, capillary     Status: Abnormal    Collection Time: 09/29/21  9:19 PM  Result Value Ref Range    Glucose-Capillary 123 (H) 70 - 99 mg/dL      Comment: Glucose reference range applies only to samples taken after fasting for at least 8 hours.  CBC     Status: Abnormal    Collection Time: 09/30/21  7:06 AM  Result Value Ref Range    WBC 4.5 4.0 - 10.5 K/uL    RBC 3.22 (L) 4.22 - 5.81 MIL/uL    Hemoglobin 10.0 (L) 13.0 - 17.0 g/dL    HCT 31.1 (L) 39.0 - 52.0 %    MCV 96.6 80.0 - 100.0 fL    MCH 31.1 26.0 - 34.0 pg    MCHC 32.2 30.0 - 36.0 g/dL    RDW 14.0 11.5 - 15.5 %    Platelets 115 (L) 150 - 400 K/uL      Comment: Immature Platelet Fraction may be clinically indicated, consider ordering this additional  test [id number] REPEATED TO VERIFY PLATELET COUNT CONFIRMED BY SMEAR      nRBC 0.0 0.0 - 0.2 %      Comment: Performed at [facility], [place] [address], [place], [place] [address]  Basic metabolic panel     Status: Abnormal    Collection Time: 09/30/21  7:06 AM  Result  Value Ref Range    Sodium 139 135 - 145 mmol/L    Potassium 3.9 3.5 - 5.1 mmol/L    Chloride 105 98 - 111 mmol/L    CO2 31 22 - 32 mmol/L    Glucose, Bld 82 70 - 99 mg/dL      Comment: Glucose reference range applies only to samples taken after fasting for at least 8 hours.    BUN 13 8 - 23 mg/dL    Creatinine, Ser 1.14 0.61 - 1.24 mg/dL    Calcium 9.4 8.9 - 10.3 mg/dL    GFR, Estimated >60 >60 mL/min      Comment: (NOTE) Calculated using the CKD-EPI Creatinine Equation (2021)      Anion gap 3 (L) 5 - 15      Comment: Performed at [facility] [address] [place], [place] [address]  Glucose, capillary     Status: None    Collection Time: 09/30/21  7:35 AM  Result Value Ref Range    Glucose-Capillary 72 70 - 99 mg/dL      Comment: Glucose reference range applies only to samples taken after fasting for at least 8 hours.  Glucose, capillary     Status: None    Collection Time: 09/30/21 11:50 AM  Result Value Ref Range    Glucose-Capillary 91 70 - 99 mg/dL      Comment: Glucose reference range applies only to samples taken after fasting for at least 8 hours.  Glucose, capillary     Status: Abnormal    Collection Time: 09/30/21  4:03 PM  Result Value Ref Range    Glucose-Capillary 121 (H) 70 - 99 mg/dL      Comment: Glucose reference range applies only to samples taken after fasting for at least 8 hours.  Protime-INR     Status: None    Collection Time: 09/30/21  4:31 PM  Result Value Ref Range    Prothrombin Time 14.2 11.4 - 15.2 seconds    INR 1.1 0.8 - 1.2      Comment: (NOTE) INR goal varies based on device and disease states. Performed at [facility], [place] [address], [place], [place] [address]    Glucose, capillary     Status: Abnormal    Collection Time: 09/30/21  9:36 PM  Result Value Ref Range    Glucose-Capillary 124 (H) 70 - 99 mg/dL      Comment: Glucose reference range applies only to samples taken after fasting for at least 8 hours.  Basic  metabolic panel     Status: Abnormal    Collection Time: 10/01/21  7:12 AM  Result Value Ref Range    Sodium 140 135 - 145 mmol/L    Potassium 4.3 3.5 - 5.1 mmol/L    Chloride 106 98 - 111 mmol/L    CO2 31 22 - 32 mmol/L    Glucose, Bld 84 70 - 99 mg/dL      Comment: Glucose reference range applies only to samples taken after fasting for at least 8 hours.    BUN 13 8 - 23 mg/dL  Creatinine, Ser 1.18 0.61 - 1.24 mg/dL    Calcium 9.6 8.9 - 10.3 mg/dL    GFR, Estimated >60 >60 mL/min      Comment: (NOTE) Calculated using the CKD-EPI Creatinine Equation (2021)      Anion gap 3 (L) 5 - 15      Comment: Performed at [facility] [address] [place], [place] [address]  CBC     Status: Abnormal    Collection Time: 10/01/21  7:12 AM  Result Value Ref Range    WBC 5.0 4.0 - 10.5 K/uL    RBC 3.34 (L) 4.22 - 5.81 MIL/uL    Hemoglobin 10.2 (L) 13.0 - 17.0 g/dL    HCT 32.5 (L) 39.0 - 52.0 %    MCV 97.3 80.0 - 100.0 fL    MCH 30.5 26.0 - 34.0 pg    MCHC 31.4 30.0 - 36.0 g/dL    RDW 14.3 11.5 - 15.5 %    Platelets 113 (L) 150 - 400 K/uL      Comment: Immature Platelet Fraction may be clinically indicated, consider ordering this additional test [id number] CONSISTENT WITH PREVIOUS RESULT REPEATED TO VERIFY      nRBC 0.0 0.0 - 0.2 %      Comment: Performed at [facility], [place] [address] [place], [place] [address]  Glucose, capillary     Status: None    Collection Time: 10/01/21  7:54 AM  Result Value Ref Range    Glucose-Capillary 76 70 - 99 mg/dL      Comment: Glucose reference range applies only to samples taken after fasting for at least 8 hours.      Imaging Results (Last 48 hours)  No results found.         Blood pressure 136/67, pulse 63, temperature 98.4 F (36.9 C), temperature source Oral, resp. rate 18, height 5\' 7"  (1.702 m), weight 134 kg, SpO2 98 %.    General: Alert and oriented to self, Rudene Anda, location, Reported year as 2032 and gave June 1  as the date (its may 31) No apparent distress. In bed appears comfortable HEENT: Healed crani incision, PERRLA, EOMI, sclera anicteric, oral mucosa pink and moist, Neck: Supple without JVD or lymphadenopathy Heart: Reg rate and rhythm. No murmurs rubs or gallops Chest: CTA bilaterally without wheezes, rales, or rhonchi; no distress, on 2L O2 Abdomen: Soft, non-tender, non-distended, bowel sounds positive. Extremities: No clubbing, cyanosis, or edema. Pulses are 2+ Psych: Pt's affect is appropriate. Pt is cooperative Skin: Clean and intact without signs of breakdown Neuro:  Follows commands, Answer questions, able to repeat 3 words, makes eye contact. Sensation intact in all 4 extremities although reports altered sensation digits 4+5 both hands, tinels neg at elbow and wrist, intrensic hand muscles atrophy noted b/l Musculoskeletal: No joint swelling or tenderness 5/5 b/l UE strength 5/5 in b/l LE with exception of Hip flexion 4/5 bilaterally Normal bulk and tone L PIV    Medical Problem List and Plan: 1. Functional deficits secondary to Debility due to septic shock              -patient may shower             -ELOS/Goals: 10-14 days, mod I PT/OT/SLP 2.  Antithrombotics: -DVT/anticoagulation:  Pharmaceutical: Coumadin and Lovenox             -antiplatelet therapy: NA 3. Pain Management:  Tylenol prn.  4. Mood: LCSW to follow for evaluation and support.              -  antipsychotic agents: N/A 5. Neuropsych: This patient is capable of making decisions on his own behalf. 6. Skin/Wound Care: Routine pressure relief measures.  7. Fluids/Electrolytes/Nutrition:  Monitor I/O. Check CMET in am. 8. CAD/ICM s/p BiV: Monitor weights daily and for symptoms with increase in activity.             --continue Coreg, Jardiance, pravastatin and Entresto.              --low salt HH diet.   -Denies chest pain 9. A fib w/RVR: HR controlled on amiodarone and Coreg.             --monitor for symptoms with  increase in activity   -Denies palpitations 10 . Pre-diabetes: Hgb A1C- 5.7. Monitor BS ac/hs--should improve with d/c of prednisone today             --continue insulin glargline but wean to off as BS improve.  11. CKD III: Avoid nephrotoxic meds. Recheck CMET in am.  -Cr 1.18 5/31 12. BPH: Will monitor voiding with bladder scan.  13. 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   14. COPD: Pulmonary hygiene. Completes prednisone 40 mg X 5 days to day. --Continue Incruse daily.  -Continue O2 [place] 15. Constipation  -Increase miralax to BID   Bary Leriche, PA-C  I have personally performed a face to face diagnostic evaluation of this patient and formulated the key components of the plan.  Additionally, I have personally reviewed laboratory data, imaging studies, as well as relevant notes and concur with the physician assistant's documentation above.  The patient's status has not changed from the original H&P.  Any changes in documentation from the acute care chart have been noted above.  Jennye Boroughs, MD

## 2021-10-01 NOTE — Progress Notes (Addendum)
   Palliative Medicine Inpatient Follow Up Note  HPI: 76 y.o. 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   Patient was admitted for sepsis from UTI and required BiPAP in the ED. Patient was weaned off but then on 5/2 had decreased level of consciousness and increased oxygen requirement, placed back on BiPAP, became hypotensive and intubated for worsened mental status. Patient was extubated on 5/12. PMT has been consulted to assist with goals of care conversation.    Today's Discussion 10/01/2021  *Please note that this is a verbal dictation therefore any spelling or grammatical errors are due to the "[place]" system interpretation.  Chart reviewed inclusive of vital signs, progress notes, laboratory results, and diagnostic images.   I met with Ricky Hayden this morning. He was resting comfortably in bed in NAD.  We discussed that he would like to lay back down after having eaten breakfast.  He shares that his appetite has been very good though he is overall feeling very weak.  We reviewed that he has been hospitalized for 5 days in the setting of sepsis and unfortunately weakness is par for the course with such a significant hospitalization. Created space and opportunity for patient to explore thoughts feelings and fears regarding current medical situation.  Ricky Hayden shares that he survived 2 months in the hospital years ago after having had "brain surgery".  He expresses optimism for the future though realizes that the final predictor of our life path is God-he attest to a strong faith.  I reviewed with Ricky Hayden the plan for him to go to CIR for continued rehabilitation which he is very much looking forward to.  Questions and concerns addressed   Palliative Support Provided  Objective Assessment: Vital  Signs Vitals:   10/01/21 0029 10/01/21 0513  BP: 128/80 136/67  Pulse: 60 63  Resp: 16 18  Temp: 97.6 F (36.4 C) 98.4 F (36.9 C)  SpO2: 100% 98%    Intake/Output Summary (Last 24 hours) at 10/01/2021 1141 Last data filed at 09/30/2021 2200 Gross per 24 hour  Intake 600 ml  Output 1350 ml  Net -750 ml   Last Weight  Most recent update: 09/30/2021  4:34 AM    Weight  134 kg (295 lb 6.7 oz)            Gen: Elderly male in no acute distress HEENT: moist mucous membranes CV: Regular rate and irregular rhythm PULM: On 3 L nasal cannula breathing is even and nonlabored  ABD: soft/nontender EXT: Generalized edema Neuro: Alert and oriented x3  SUMMARY OF RECOMMENDATIONS   Continue full code/full scope treatment  Patient has decided on CIR Patient would like straightforward and honest interactions with staff, endorsing confusion and some difficulty processing conversations Psychosocial and emotional support provided PMT will continue to follow  ______________________________________________________________________________________ [facility] Team Team Cell Phone: [phone] Please utilize secure chat with additional questions, if there is no response within 30 minutes please call the above phone number  Palliative Medicine Team providers are available by phone from 7am to 7pm daily and can be reached through the team cell phone.  Should this patient require assistance outside of these hours, please call the patient's attending physician.

## 2021-10-01 NOTE — PMR Pre-admission (Signed)
PMR Admission Coordinator Pre-Admission Assessment  Patient: Ricky Hayden is an 76 y.o., male MRN: [id number] DOB: 12/18/1945 Height: _0  (170.2 cm) Weight: 134 kg  IInsurance Information HMO:     PPO:      PCP:      IPA:      80/20:      OTHER:  PRIMARY: [place]     Policy#: [id number]      Subscriber: pt CM Name: Rubin Payor      Phone#: [phone]     Fax#: [phone] Pre-Cert#: [id number] [place] for CIR from Kyle with [facility] with updates due to fax listed above on 10/24/21        Employer:  Benefits:  Phone #: [phone]     Name:  Eff. Date: 09/25/2017     Deduct: $0      Out of Pocket Max: $0      Life Max:  CIR: 100%      SNF: 100% Outpatient: 100%     Co-Pay:  Home Health: 100%      Co-Pay:  DME: 100%     Co-Pay:  Providers:  SECONDARY: Medicare Part A      Policy#: [id number]     Phone#:    Financial Counselor:       Phone#:   The "Data Collection Information Summary" for patients in Inpatient Rehabilitation Facilities with attached "Privacy Act [place] Records" was provided and verbally reviewed with: Patient and Family  Emergency Contact Information Contact Information     Name Relation Home Work Mobile   [name] Daughter   [phone]   [name] Daughter   [phone]   [name], [name] [phone]         Current Medical History  Patient Admitting Diagnosis: debility  History of Present Illness: Pt is a 76 y/o male with PMH of combined systolic/diastolic CHF with ICD, atrial fibrillation, CKD III, COPD, and seizures, who presented to [facility] on 4/27 with worsening chills, fevers, and SOB.  He initially required bipap, tmax 103 in ED with UA showing features concerning for UTI so started on empiric antibiotics.  Cultures grew Proteus, ID consulted.  Pt developed septic shock with worsening respiratory status and fluid overload, transferred to ICU on 5/2 for intubation and had brief PEA arrest with quick ROSC.  Pt was extubated on  5/12.  Antibiotics completed.  Hospital course further complicated by AKI.  Pt was admitted to CIR on 5/19.  While on rehab pt demonstrated bouts of lethargy and delirium.  Maintained on dysphagia diet.  On 5/24 he developed hypotension with SBP in the 70s, as well as wide-complex tachycardia with HR of 130.  Dr. Marlou Porch was consulted and recommended loading dose of amio and fluid boluses.  He was transferred back to acute services on 5/25 for DCCV on 5/26.  Therapy ongoing and pt was recommended to return to CIR to complete program prior to d/c home.     Patient's medical record from [facility] has been reviewed by the rehabilitation admission coordinator and physician.  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 Sherwood Gambler   Nocturia    Obesity    Pansinusitis    a.  complicated by brain abscess and bleeding requiring craniotomy in 1995.   PONV (postoperative nausea and vomiting)    Sinusitis    s/p ethmoidectomy and nasal septoplasty   Vertigo    intermitantly    Has the patient had major surgery during 100 days prior to admission? No  Family History   family history includes Heart attack (age of onset: 34) in his father; Heart failure (age of onset: 51) in his  mother.  Current Medications  Current Facility-Administered Medications:    acetaminophen (TYLENOL) tablet 325-650 mg, 325-650 mg, Oral, Q4H PRN, [name], MD   albuterol (PROVENTIL) (2.5 MG/3ML) 0.083% nebulizer solution 2.5 mg, 2.5 mg, Nebulization, Q2H PRN, [name], MD   Derrill Memo ON 10/04/2021] amiodarone (PACERONE) tablet 200 mg, 200 mg, Oral, BID, Pemberton, Greer Ee, MD   [START ON 10/11/2021] amiodarone (PACERONE) tablet 200 mg, 200 mg, Oral, Daily, Pemberton, Heather E, MD   amiodarone (PACERONE) tablet 400 mg, 400 mg, Oral, BID, Pemberton, Heather E, MD, 400 mg at 10/01/21 0858   bisacodyl (DULCOLAX) suppository 10 mg, 10 mg, Rectal, Daily PRN, [name], MD   carvedilol (COREG) tablet 3.125 mg, 3.125 mg, Oral, BID WC, Larae Grooms S, MD, 3.125 mg at 10/01/21 1950   cholecalciferol (VITAMIN D3) tablet 1,000 Units, 1,000 Units, Oral, Daily, [name], MD, 1,000 Units at 10/01/21 0859   empagliflozin (JARDIANCE) tablet 10 mg, 10 mg, Oral, Daily, Freada Bergeron, MD, 10 mg at 10/01/21 0859   enoxaparin (LOVENOX) injection 130 mg, 130 mg, Subcutaneous, Q12H, [name], MD, 130 mg at 10/01/21 9326   finasteride (PROSCAR) tablet 2.5 mg, 2.5 mg, Oral, Daily, [name], MD, 2.5 mg at 10/01/21 0902   fluticasone furoate-vilanterol (BREO ELLIPTA) 200-25 MCG/ACT 1 puff, 1 puff, Inhalation, Daily, Candee Furbish, MD, 1 puff at 10/01/21 0858   insulin aspart (novoLOG) injection 0-15 Units, 0-15 Units, Subcutaneous, TID WC, [name], MD, 2 Units at 09/29/21 1736   insulin aspart (novoLOG) injection 0-5 Units, 0-5 Units, Subcutaneous, QHS, [name], MD   insulin glargine-yfgn [facility]) injection 6 Units, 6 Units, Subcutaneous, Daily, [name], MD, 6 Units at 10/01/21 0902   MEDLINE mouth rinse, 15 mL, Mouth Rinse, BID, [name], MD, 15 mL at 10/01/21 0916   melatonin tablet 3 mg, 3 mg, Oral, QHS, [name], MD, 3 mg at 09/30/21 2206   pantoprazole (PROTONIX) EC tablet 40 mg, 40 mg, Oral, Daily, [name], MD, 40 mg at 10/01/21 0859   PHENobarbital (LUMINAL) tablet 64.8 mg, 64.8 mg, Oral, BID, [name], MD, 64.8 mg at 10/01/21 0859   polyethylene glycol (MIRALAX / GLYCOLAX) packet 17 g, 17 g, Oral, Daily PRN, [name], MD, 17 g at 09/26/21 0947   polyethylene glycol (MIRALAX / GLYCOLAX) packet 17 g, 17 g, Oral, Daily, [name], MD, 17 g at 10/01/21 0900   pravastatin (PRAVACHOL) tablet 40 mg, 40 mg, Oral, QPM, [name], MD,  40 mg at 09/30/21 1639   predniSONE (DELTASONE) tablet 40 mg, 40 mg, Oral, Q breakfast, Candee Furbish, MD, 40 mg at 10/01/21 7564   sacubitril-valsartan (ENTRESTO) 24-26 mg per tablet, 1 tablet, Oral, BID, Margie Billet, NP, 1 tablet at 10/01/21 0858   umeclidinium bromide (INCRUSE ELLIPTA) 62.5 MCG/ACT 1 puff, 1 puff, Inhalation, Daily, Candee Furbish, MD, 1 puff at 10/01/21 3329   Warfarin - Pharmacist Dosing Inpatient, , Does not apply, q1600, Shelly Coss, MD  Patients Current Diet:  Diet Order             DIET DYS 3 Room service appropriate? Yes; Fluid consistency: Thin  Diet effective now                   Precautions / Restrictions Precautions Precautions: Fall Precaution Comments: Watch SpO2 (on 3L O2, does not wear baseline), recent orthostatic hypotension; h/o vertigo with current symptoms Restrictions Weight Bearing Restrictions: No   Has the patient had 2 or more falls or a fall with injury in the past year? No  Prior Activity Level Limited Community (1-2x/wk): mod I with rollator, driving  Prior Functional Level Self Care: Did the patient need help bathing, dressing, using the toilet or eating? Independent  Indoor Mobility: Did the patient need assistance with walking from room to room (with or without device)? Independent  Stairs: Did the patient need assistance with internal or external  stairs (with or without device)? Independent  Functional Cognition: Did the patient need help planning regular tasks such as shopping or remembering to take medications? Independent  Patient Information Are you of Hispanic, Latino/a,or Spanish origin?: A. No, not of Hispanic, Latino/a, or Spanish origin What is your race?: A. White Do you need or want an interpreter to communicate with a doctor or health care staff?: 0. No  Patient's Response To:  Health Literacy and Transportation Is the patient able to respond to health literacy and transportation needs?: Yes Health Literacy - How often do you need to have someone help you when you read instructions, pamphlets, or other written material from your doctor or pharmacy?: Never In the past 12 months, has lack of transportation kept you from medical appointments or from getting medications?: No In the past 12 months, has lack of transportation kept you from meetings, work, or from getting things needed for daily living?: No  [place] / [place] Devices/Equipment: Environmental consultant (specify type), Bedside commode/3-in-1 Home Equipment: Rollator (4 wheels), Hospital bed, Shower seat  Prior Device Use: Indicate devices/aids used by the patient prior to current illness, exacerbation or injury? Walker  Current Functional Level Cognition  Overall Cognitive Status: Within Functional Limits for tasks assessed Current Attention Level: Selective Orientation Level: Oriented X4 Following Commands: Follows one step commands with increased time Safety/Judgement: Decreased awareness of deficits, Decreased awareness of safety General Comments: pt was in pleasant mood, asked if he could apologize to previous therapist he felt he mistreated    Extremity Assessment (includes Sensation/Coordination)  Upper Extremity Assessment: Generalized weakness RUE Sensation: decreased light touch LUE Sensation: decreased light touch  Lower  Extremity Assessment: Generalized weakness    ADLs  Overall ADL's : Needs assistance/impaired Grooming: Wash/dry hands, Wash/dry face, Oral care, Supervision/safety, Sitting Grooming Details (indicate cue type and reason): sitting at sink Upper Body Bathing: Supervision/ safety, Sitting Upper Body Bathing Details (indicate cue type and reason): sitting at sink Lower Body Bathing: Maximal assistance, Sit to/from stand Upper Body Dressing : Sitting, Minimal  assistance Upper Body Dressing Details (indicate cue type and reason): for changing gown Lower Body Dressing: Maximal assistance, Sit to/from stand Toilet Transfer: Moderate assistance, Stand-pivot, BSC/3in1 General ADL Comments: Patient ambulated to sink for grooming and performed seated due to fatigue. Returned to EOB to address gown change    Mobility  Overal bed mobility: Needs Assistance Bed Mobility: Supine to Sit Sidelying to sit: Min assist Supine to sit: Supervision, HOB elevated Sit to supine: Min assist Sit to sidelying: Mod assist General bed mobility comments: Pt moved from supine to sit with verbal cueing    Transfers  Overall transfer level: Needs assistance Equipment used: Rolling walker (2 wheels) Transfers: Sit to/from Stand, Bed to chair/wheelchair/BSC Sit to Stand: Mod assist Bed to/from chair/wheelchair/BSC transfer type:: Step pivot Stand pivot transfers: Mod assist Step pivot transfers: Min assist General transfer comment: Mod assist to stand from EOB to rolling walker, cues for hand placement with powerup. pt demonstrated normal sequencing to reach back in controlled lowering. Pt wtih good recall with hand placement for the second time and min assist to pivot to recliner.    Ambulation / Gait / Stairs / Wheelchair Mobility  Ambulation/Gait Ambulation/Gait assistance: Herbalist (Feet): 60 Feet Assistive device: Rolling walker (2 wheels) Gait Pattern/deviations: Step-through pattern,  Decreased stride length, Trunk flexed, Knees buckling, Wide base of support General Gait Details: Pt drifted toward the right, needed to provide min assist and verbal cues to guide his direction during gait. L knee buckled once. Min assist to prevent LOB. Pt on 3 L with SpO2 98 Gait velocity: decreased    Posture / Balance Dynamic Sitting Balance Sitting balance - Comments: sitting on EOB  upon arrival with daughter doing puzzles Balance Overall balance assessment: Needs assistance Sitting-balance support: No upper extremity supported, Feet supported Sitting balance-Leahy Scale: Good Sitting balance - Comments: sitting on EOB  upon arrival with daughter doing puzzles Standing balance support: Bilateral upper extremity supported, During functional activity, Reliant on assistive device for balance Standing balance-Leahy Scale: Poor Standing balance comment: reliant on BUE support and external assist    Special needs/care consideration Oxygen 2L at noc, Special Bed air mattress overlay, and Diabetic management yes   Previous Home Environment (from acute therapy documentation) Living Arrangements: Alone  Lives With: Alone, Family, Other (Comment) Available Help at Discharge: Family, Available 24 hours/day Type of Home: House Home Layout: One level Home Access: Stairs to enter Entrance Stairs-Rails: None Entrance Stairs-Number of Steps: 1 step onto porch and then step into house [employer] Shower/Tub: Gaffer, Chiropodist: Handicapped height Bathroom Accessibility: Yes How Accessible: Accessible via walker [place]: No Additional Comments: children rotate staying overnight  Discharge Living Setting Plans for Discharge Living Setting: Patient's home (4 adult children to assist, plus hired caregivers and [facility] caregivers) Type of Home at Discharge: House Discharge Home Layout: One level Discharge Home Access: Stairs to enter Entrance Stairs-Rails:  None Entrance Stairs-Number of Steps: 3 Discharge Bathroom Shower/Tub: Walk-in shower Discharge Bathroom Toilet: Handicapped height Discharge Bathroom Accessibility: Yes How Accessible: Accessible via walker Does the patient have any problems obtaining your medications?: No  Social/Family/Support Systems Patient Roles: Parent Anticipated Caregiver: 4 adult children involved, [name] and [name] are main contacts Anticipated Caregiver's Contact Information: [name] [phone]; [name] [phone] Ability/Limitations of Caregiver: supervision only (provided between [place] caregivers daytimes and childrens nights/weekends) Caregiver Availability: Other (Comment) (working towards 24/7) Discharge Plan Discussed with Primary Caregiver: Yes Is Caregiver In Agreement with Plan?: Yes Does  Caregiver/Family have Issues with Lodging/Transportation while Pt is in Rehab?: No  Goals Patient/Family Goal for Rehab: PT/OT/SLP supervision to mod I Expected length of stay: 10-14 days Additional Information: would benefit from neuropsych Pt/Family Agrees to Admission and willing to participate: Yes Program Orientation Provided & Reviewed with Pt/Caregiver Including Roles  & Responsibilities: Yes  Barriers to Discharge: Insurance for SNF coverage, Decreased caregiver support  Decrease burden of Care through IP rehab admission: n/a  Possible need for SNF placement upon discharge: Not anticipated.  Pt's daughters working to pull together 24/7 support between family and [facility] caregivers, if needed, at discharge.   Patient Condition: I have reviewed medical records from [facility], spoken with  [facility] team , and patient and daughter. I met with patient at the bedside and discussed via phone for inpatient rehabilitation assessment.  Patient will benefit from ongoing PT, OT, and SLP, can actively participate in 3 hours of therapy a day 5 days of the week, and can make measurable gains during the admission.  Patient will also  benefit from the coordinated team approach during an Inpatient Acute Rehabilitation admission.  The patient will receive intensive therapy as well as Rehabilitation physician, nursing, social worker, and care management interventions.  Due to bladder management, bowel management, safety, skin/wound care, disease management, medication administration, pain management, and patient education the patient requires 24 hour a day rehabilitation nursing.  The patient is currently min to mod assist with mobility and basic ADLs.  Discharge setting and therapy post discharge at home with home health is anticipated.  Patient has agreed to participate in the Acute Inpatient Rehabilitation Program and will admit today.  Preadmission Screen Completed By:  Michel Santee, PT, DPT 10/01/2021 11:16 AM ______________________________________________________________________   Discussed status with Dr. Curlene Dolphin on 10/01/21  at 11:16 AM  and received approval for admission today.  Admission Coordinator:  Michel Santee, PT, DPT time 11:16 AM Sudie Grumbling 10/01/21    Assessment/Plan: Diagnosis:deficits secondary to debility from septic shock Does the need for close, 24 hr/day Medical supervision in concert with the patient's rehab needs make it unreasonable for this patient to be served in a less intensive setting? Yes Co-Morbidities requiring supervision/potential complications: Afbit/Aflutter, CHF systolic, COPD, CKD stage 3, BPH, seizure disorder, obesity Due to bladder management, bowel management, safety, skin/wound care, disease management, medication administration, pain management, and patient education, does the patient require 24 hr/day rehab nursing? Yes Does the patient require coordinated care of a physician, rehab nurse, PT, OT, and SLP to address physical and functional deficits in the context of the above medical diagnosis(es)? Yes Addressing deficits in the following areas: balance, endurance, locomotion,  strength, transferring, bowel/bladder control, bathing, dressing, feeding, grooming, toileting, cognition, speech, language, swallowing, and psychosocial support Can the patient actively participate in an intensive therapy program of at least 3 hrs of therapy 5 days a week? Yes The potential for patient to make measurable gains while on inpatient rehab is good Anticipated functional outcomes upon discharge from inpatient rehab: modified independent and supervision PT, modified independent and supervision OT, modified independent and supervision SLP Estimated rehab length of stay to reach the above functional goals is: 10-14 Anticipated discharge destination: Home 10. Overall Rehab/Functional Prognosis: good   MD Signature: Jennye Boroughs

## 2021-10-01 NOTE — Progress Notes (Addendum)
Progress Note  Patient Name: Ricky Hayden Date of Encounter: 10/01/2021  [facility] Cardiologist: Virl Axe, MD   Subjective   Patient states he is ok, denied any chest pain, resting SOB, states gets SOB when he walks around, has COPD, also felt dizzy when stands up too quickly.   Inpatient Medications    Scheduled Meds:  [START ON 10/04/2021] amiodarone  200 mg Oral BID   [START ON 10/11/2021] amiodarone  200 mg Oral Daily   amiodarone  400 mg Oral BID   carvedilol  3.125 mg Oral BID WC   cholecalciferol  1,000 Units Oral Daily   empagliflozin  10 mg Oral Daily   enoxaparin (LOVENOX) injection  130 mg Subcutaneous Q12H   finasteride  2.5 mg Oral Daily   fluticasone furoate-vilanterol  1 puff Inhalation Daily   insulin aspart  0-15 Units Subcutaneous TID WC   insulin aspart  0-5 Units Subcutaneous QHS   insulin glargine-yfgn  6 Units Subcutaneous Daily   mouth rinse  15 mL Mouth Rinse BID   melatonin  3 mg Oral QHS   pantoprazole  40 mg Oral Daily   PHENobarbital  64.8 mg Oral BID   polyethylene glycol  17 g Oral Daily   pravastatin  40 mg Oral QPM   predniSONE  40 mg Oral Q breakfast   sacubitril-valsartan  1 tablet Oral BID   umeclidinium bromide  1 puff Inhalation Daily   Warfarin - Pharmacist Dosing Inpatient   Does not apply q1600   Continuous Infusions:  PRN Meds: acetaminophen, albuterol, bisacodyl, polyethylene glycol   Vital Signs    Vitals:   09/30/21 1606 09/30/21 2134 10/01/21 0029 10/01/21 0513  BP: 114/87 126/74 128/80 136/67  Pulse:  60 60 63  Resp: 18 16 16 18   Temp: 97.9 F (36.6 C) 97.7 F (36.5 C) 97.6 F (36.4 C) 98.4 F (36.9 C)  TempSrc: Oral Oral Oral Oral  SpO2:  100% 100% 98%  Weight:      Height:        Intake/Output Summary (Last 24 hours) at 10/01/2021 0815 Last data filed at 09/30/2021 2200 Gross per 24 hour  Intake 600 ml  Output 1350 ml  Net -750 ml      09/30/2021    4:34 AM 09/29/2021    4:40 AM 09/27/2021     5:00 AM  Last 3 Weights  Weight (lbs) 295 lb 6.7 oz 302 lb 0.5 oz 295 lb 6.7 oz  Weight (kg) 134 kg 137 kg 134 kg      Telemetry    Ventricular paced rhythm, NSVT lasting up to 3 beats - Personally Reviewed  ECG    No new tracing today - Personally Reviewed  Physical Exam   GEN: No acute distress.   Neck: No JVD Cardiac: RRR, soft murmur  Respiratory: Non labored. Clear to auscultation bilaterally.on [place] oxygen  GI: Soft, nontender, non-distended  MS: Trace edema of BLE; No deformity. Neuro:  Nonfocal  Psych: Normal affect   Labs    High Sensitivity Troponin:   Recent Labs  Lab 09/03/21 0850  TROPONINIHS 35*     Chemistry Recent Labs  Lab 09/26/21 0052 09/27/21 0132 09/30/21 0706  NA 136 140 139  K 4.1 4.2 3.9  CL 102 103 105  CO2 29 31 31   GLUCOSE 111* 88 82  BUN 36* 26* 13  CREATININE 1.52* 1.38* 1.14  CALCIUM 9.3 9.1 9.4  MG 2.1 2.0  --   [facility]  47* 53* >60  ANIONGAP 5 6 3*    Lipids No results for input(s): CHOL, TRIG, HDL, LABVLDL, LDLCALC, CHOLHDL in the last 168 hours.  Hematology Recent Labs  Lab 09/26/21 0052 09/27/21 0132 09/30/21 0706  WBC 5.4 5.0 4.5  RBC 3.34* 3.03* 3.22*  HGB 10.3* 9.3* 10.0*  HCT 31.9* 29.2* 31.1*  MCV 95.5 96.4 96.6  MCH 30.8 30.7 31.1  MCHC 32.3 31.8 32.2  RDW 13.7 13.9 14.0  PLT 177 147* 115*   Thyroid No results for input(s): TSH, FREET4 in the last 168 hours.  BNPNo results for input(s): BNP, PROBNP in the last 168 hours.  DDimer No results for input(s): DDIMER in the last 168 hours.   Radiology    No results found.  Cardiac Studies   TEE 09/26/21:   1. Left ventricular ejection fraction, by estimation, is 30 to 35%. The  left ventricle has moderately decreased function. The left ventricle  demonstrates global hypokinesis.   2. Right ventricular systolic function is normal. The right ventricular  size is normal.   3. No left atrial/left atrial appendage thrombus was detected.   4. The mitral  valve is normal in structure. Trivial mitral valve  regurgitation. No evidence of mitral stenosis.   5. The aortic valve is tricuspid. Aortic valve regurgitation is trivial.  No aortic stenosis is present.   6. The inferior vena cava is normal in size with greater than 50%  respiratory variability, suggesting right atrial pressure of 3 mmHg.   Conclusion(s)/Recommendation(s): Normal biventricular function without  evidence of hemodynamically significant valvular heart disease.   Patient Profile     76 y.o. 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 Course complicated by acute hypoxic respiratory failure s/p intubation with PEA arrest. Transferred from ICU to and discharged to CIR subsequently. 09/25/21 at CIR patient developed Aflutter with RVR with hypotension, cardiology is consulted and following.   Assessment & Plan    Paroxysmal A flutter/A fib with RVR - In the setting of sepsis,  complicated by hypotension this admission. Placed on amiodarone and ultimately underwent TEE/DCCV on 09/26/21. Now in SR/ventricular paced rhythm -Was previously on apixaban which was changed due to lovenox as phenobarbital decreases effectiveness of apixaban, edoxaban also has drug drug interaction with phenobarbital, decision is made to continue anticoagulation with couamdin -Continue amiodarone 400mg  BID x7 days, 200mg  BID x 7days and then 200mg  daily thereafter -Continue therapeutic lovenox bridge to coumadin currently, INR goal 2-3  -Coreg 3.125mg  BID added since 5/29  - will try establish care with our coumadin clinic    Chronic Systolic Heart Failure: S/p BiV [employer] ICD: - TTE from 08/29/21 with LVEF 40-45%, but dropped to 30-35% on TEE 5/26.  Likely worsened in the setting of Aflutter with RVR as well as sepsis. GDMT initially held due to hypotension which is now improved.  - GDMT: Continue Coreg 3.125 twice daily (home dose 6.25 mg), resumed Jardiance 10 mg daily, will discontinue losartan and resume home meds Entresto today - will check BMP today - will check orthostatic VS today   Septic shock with  Proteus Mirabella bacteremia and UTI, resolved  Chronic Hypoxic Respiratory Failure Oxygen dependent COPD with exacerbation Insulin-dependent type 2 diabetes CKD stage III BPH Seizure disorder Obesity -Managed per  internal medicine     For questions or updates, please contact [place] Please consult [email or web address] for contact info under        Signed, Margie Billet, NP  10/01/2021, 8:15 AM    I have seen and examined the patient along with Margie Billet, NP .  I have reviewed the chart, notes and new data.  I agree with PA/NP's note.  Key new complaints: Dizziness if he gets out of bed too quickly.  No dyspnea. Key examination changes: Able to lie flat without dyspnea, heart disease or venous pulsations, no edema, clear lungs Key new findings / data: Stable renal parameters  PLAN: Gradually reintroduce heart failure medications, making allowance for orthostatic hypotension.  We will follow when he goes to CIR.  Sanda Klein, MD, [place] [phone] 10/01/2021, 12:18 PM

## 2021-10-01 NOTE — Discharge Instructions (Signed)
Information on my medicine - Coumadin   (Warfarin)  This medication education was reviewed with me or my healthcare representative as part of my discharge preparation.  The pharmacist that spoke with me during my hospital stay was:  Georgina Peer, [facility]  Why was Coumadin prescribed for you? Coumadin was prescribed for you because you have a blood clot or a medical condition that can cause an increased risk of forming blood clots. Blood clots can cause serious health problems by blocking the flow of blood to the heart, lung, or brain. Coumadin can prevent harmful blood clots from forming. As a reminder your indication for Coumadin is:  Stroke Prevention because of Atrial Fibrillation  What test will check on my response to Coumadin? While on Coumadin (warfarin) you will need to have an INR test regularly to ensure that your dose is keeping you in the desired range. The INR (international normalized ratio) number is calculated from the result of the laboratory test called prothrombin time (PT).  If an INR APPOINTMENT HAS NOT ALREADY BEEN MADE FOR YOU please schedule an appointment to have this lab work done by your health care provider within 7 days. Your INR goal is usually a number between:  2 to 3 or your provider may give you a more narrow range like 2-2.5.  Ask your health care provider during an office visit what your goal INR is.  What  do you need to  know  About  COUMADIN? Take Coumadin (warfarin) exactly as prescribed by your healthcare provider about the same time each day.  DO NOT stop taking without talking to the doctor who prescribed the medication.  Stopping without other blood clot prevention medication to take the place of Coumadin may increase your risk of developing a new clot or stroke.  Get refills before you run out.  What do you do if you miss a dose? If you miss a dose, take it as soon as you remember on the same day then continue your regularly scheduled regimen the next  day.  Do not take two doses of Coumadin at the same time.  Important Safety Information A possible side effect of Coumadin (Warfarin) is an increased risk of bleeding. You should call your healthcare provider right away if you experience any of the following: Bleeding from an injury or your nose that does not stop. Unusual colored urine (red or dark brown) or unusual colored stools (red or black). Unusual bruising for unknown reasons. A serious fall or if you hit your head (even if there is no bleeding).  Some foods or medicines interact with Coumadin (warfarin) and might alter your response to warfarin. To help avoid this: Eat a balanced diet, maintaining a consistent amount of Vitamin K. Notify your provider about major diet changes you plan to make. Avoid alcohol or limit your intake to 1 drink for women and 2 drinks for men per day. (1 drink is 5 oz. wine, 12 oz. beer, or 1.5 oz. liquor.)  Make sure that ANY health care provider who prescribes medication for you knows that you are taking Coumadin (warfarin).  Also make sure the healthcare provider who is monitoring your Coumadin knows when you have started a new medication including herbals and non-prescription products.  Coumadin (Warfarin)  Major Drug Interactions  Increased Warfarin Effect Decreased Warfarin Effect  Alcohol (large quantities) Antibiotics (esp. Septra/Bactrim, Flagyl, Cipro) Amiodarone (Cordarone) Aspirin (ASA) Cimetidine (Tagamet) Megestrol (Megace) NSAIDs (ibuprofen, naproxen, etc.) Piroxicam (Feldene) Propafenone (Rythmol SR) Propranolol (  Inderal) Isoniazid (INH) Posaconazole (Noxafil) Barbiturates (Phenobarbital) Carbamazepine (Tegretol) Chlordiazepoxide (Librium) Cholestyramine (Questran) Griseofulvin Oral Contraceptives Rifampin Sucralfate (Carafate) Vitamin K   Coumadin (Warfarin) Major Herbal Interactions  Increased Warfarin Effect Decreased Warfarin Effect  Garlic Ginseng Ginkgo biloba  Coenzyme Q10 Green tea St. John's wort    Coumadin (Warfarin) FOOD Interactions  Eat a consistent number of servings per week of foods HIGH in Vitamin K (1 serving =  cup)  Collards (cooked, or boiled & drained) Kale (cooked, or boiled & drained) Mustard greens (cooked, or boiled & drained) Parsley *serving size only =  cup Spinach (cooked, or boiled & drained) Swiss chard (cooked, or boiled & drained) Turnip greens (cooked, or boiled & drained)  Eat a consistent number of servings per week of foods MEDIUM-HIGH in Vitamin K (1 serving = 1 cup)  Asparagus (cooked, or boiled & drained) Broccoli (cooked, boiled & drained, or raw & chopped) Brussel sprouts (cooked, or boiled & drained) *serving size only =  cup Lettuce, raw (green leaf, endive, romaine) Spinach, raw Turnip greens, raw & chopped   These websites have more information on Coumadin (warfarin):  FailFactory.se; VeganReport.com.au;

## 2021-10-01 NOTE — Progress Notes (Signed)
Report given to staff on [facility]. Patient notified that he will be transferred to [facility] as soon as possible when staff available for transfer.

## 2021-10-01 NOTE — Discharge Summary (Signed)
Physician Discharge Summary  Ricky Hayden LOV:[id number] DOB: 1945/10/05 DOA: 09/25/2021  PCP: [facility]  Admit date: 09/25/2021 Discharge date: 10/01/2021  Admitted From: Home Disposition:  Home  Discharge Condition:Stable CODE STATUS:FULL Diet recommendation: Heart Healthy   Brief/Interim Summary: Patient is a 76 year old male with history of abdominal aortic aneurysm, systolic congestive heart failure,  CKD stage IIIa, coronary artery disease, hypertension, hyperlipidemia, obesity, GERD who presented here initially with complaints of shortness of breath.  Patient was found to be hypoxic in presentation.    On presentation he was hypoxic, put on BiPAP.  He was also febrile so started on antibiotics.  Hospital course remarkable for worsening mental status, hypoxemia.  He was transferred to the ICU, intubated.  Went into short PA arrest after intubation, became hypotensive, had to be started on pressors.  Patient was then extubated, became  hemodynamically stable and was discharged to CIR.  On 5/25 at CIR, he was found to have rhythm change, found to be in A-fib with hypotension.  Patient was then admitted to ICU, cardiology consulted.  Underwent cardioversion,now in normal sinus rhythm.  Currently hemodynamically stable.  He is being discharged back to CIR.  Following problems were addressed during his hospitalization:  A-flutter/A-fib with RVR: Status post cardioversion.  Currently on oral amiodarone.  Currently on Lovenox/warfarin for stroke prevention due to phenobarbital use, cannot be on  Eliquis.  Currently on low-dose Coreg.  Cardiology following.  Continue Lovenox bridging with warfarin until INR is around 2.  Continue to monitor INR in 2 to 3 days.   Chronic systolic congestive heart failure: He was  on losartan, Coreg, Jardiance.  Cardiology switched him back on Entresto.Has a pacemaker.resumed torsemide   Chronic hypoxic respiratory failure/acute exacerbation of COPD:  Currently on baseline oxygen requirement.  Started on steroids by PCCM.  Continue bronchodilators. On his last admission, he was intubated and sent to ICU.  Currently respiratory status stable.  He is at on oxygen at home on 2 L as needed.  Currently on 2 L of oxygen per minute.   History of nephrolithiasis: Follows with urology .  Needs outpatient follow-up for cystoscopy   Type of diabetes: Continue current insulin regimen.  Monitor blood sugars.  On Jardiance   Stage III CKD: Baseline creatinine around 1.3-1.6.  Currently kidney function at baseline.   History of BPH: On Proscar.   Seizure disorder: On phenobarbital since 1995.  Currently seizure-free. Continue.  Follow-up with neurology as an outpatient   Morbid obesity: BMI of 47.   Septic shock/gram-negative bacteremia/complicated UTI: Was admitted here on 4/27, found to have Proteus Mirabella's bacteremia..  Completed 10 days course of antibiotics.  Currently hemodynamically stable  Thrombocytopenia: Unknown etiology.  Mild.  Continue to monitor while he is being on warfarin and Lovenox.  Need to hold anticoagulation if significant drop in the platelets level and this may warrant hematology consult at CIR.   Debility/deconditioning: Patient seen by PT and OT, plan is to send him back to CIR   goals of care: Multiple comorbidities.  Palliative care was consulted, plan is to remain as full code   Discharge Diagnoses:  Principal Problem:   A-fib ([name]) Active Problems:   Hyperlipidemia   GERD   Morbid obesity ([name])   Debility    Discharge Instructions  Discharge Instructions     Diet - low sodium heart healthy   Complete by: As directed    Diet general   Complete by: As directed    Dysphagia  3 diet   Discharge instructions   Complete by: As directed    1)Monitor your INR evrey 2-3 days 2)Do CBC in 2-3 days to check your platelets level   Increase activity slowly   Complete by: As directed    No wound care    Complete by: As directed       Allergies as of 10/01/2021       Reactions   Calcium Channel Blockers Other (See Comments)   Came to hospital in 1995-caused chest pain        Medication List     STOP taking these medications    apixaban 5 MG Tabs tablet Commonly known as: ELIQUIS       TAKE these medications    acetaminophen 500 MG tablet Commonly known as: TYLENOL Take 1,000 mg by mouth every 6 (six) hours as needed for moderate pain or headache.   albuterol 108 (90 Base) MCG/ACT inhaler Commonly known as: VENTOLIN HFA Inhale 2 puffs into the lungs every 6 (six) hours as needed for wheezing.   amiodarone 400 MG tablet Commonly known as: PACERONE Take 1 tablet (400 mg total) by mouth 2 (two) times daily for 3 days. What changed:  medication strength how much to take when to take this   amiodarone 200 MG tablet Commonly known as: PACERONE Take 1 tablet (200 mg total) by mouth 2 (two) times daily for 7 days. Start taking on: October 04, 2021 What changed: You were already taking a medication with the same name, and this prescription was added. Make sure you understand how and when to take each.   amiodarone 200 MG tablet Commonly known as: PACERONE Take 1 tablet (200 mg total) by mouth daily. Start taking on: October 11, 2021 What changed: You were already taking a medication with the same name, and this prescription was added. Make sure you understand how and when to take each.   budesonide-formoterol 80-4.5 MCG/ACT inhaler Commonly known as: Symbicort Inhale 2 puffs into the lungs 2 (two) times daily.   carvedilol 3.125 MG tablet Commonly known as: COREG Take 1 tablet (3.125 mg total) by mouth 2 (two) times daily with a meal. What changed:  medication strength how much to take   cetirizine 10 MG tablet Commonly known as: ZYRTEC Take 1 tablet by mouth daily.   diclofenac Sodium 1 % Gel Commonly known as: VOLTAREN Apply 2 g topically daily as needed  (pain).   docusate sodium 100 MG capsule Commonly known as: COLACE Take 1-2 capsules (100-200 mg total) by mouth 2 (two) times daily as needed for mild constipation.   empagliflozin 10 MG Tabs tablet Commonly known as: Jardiance Take 1 tablet (10 mg total) by mouth daily before breakfast.   enoxaparin 150 MG/ML injection Commonly known as: LOVENOX Inject 0.86 mLs (130 mg total) into the skin every 12 (twelve) hours. Continue warfarin and Lovenox bridging until INR comes around 2.then can stop lovenox   Entresto 24-26 MG Generic drug: sacubitril-valsartan Take 1 tablet by mouth 2 (two) times daily. What changed: Another medication with the same name was added. Make sure you understand how and when to take each.   sacubitril-valsartan 24-26 MG Commonly known as: ENTRESTO Take 1 tablet by mouth 2 (two) times daily. What changed: You were already taking a medication with the same name, and this prescription was added. Make sure you understand how and when to take each.   feeding supplement Liqd Take 237 mLs by mouth 2 (two) times  daily between meals.   finasteride 5 MG tablet Commonly known as: PROSCAR TAKE ONE TABLET BY MOUTH ONCE DAILY   GINKGO BILOBA PO Take 2 tablets by mouth at bedtime.   ICAPS AREDS 2 PO Take 2 tablets by mouth daily.   insulin glargine-yfgn 100 UNIT/ML injection Commonly known as: SEMGLEE Inject 0.08 mLs (8 Units total) into the skin daily.   omeprazole 20 MG capsule Commonly known as: PRILOSEC Take 1 capsule (20 mg total) by mouth daily.   PHENobarbital 64.8 MG tablet Commonly known as: LUMINAL Take 1 tablet (64.8 mg total) by mouth 2 (two) times daily.   polyethylene glycol 17 g packet Commonly known as: MIRALAX / GLYCOLAX Take 17 g by mouth daily. Start taking on: October 02, 2021 What changed:  when to take this reasons to take this   pravastatin 40 MG tablet Commonly known as: PRAVACHOL Take 1 tablet (40 mg total) by mouth every  evening.   predniSONE 20 MG tablet Commonly known as: DELTASONE Take 2 tablets (40 mg total) by mouth daily with breakfast for 1 day. Start taking on: October 02, 2021   Spiriva HandiHaler 18 MCG inhalation capsule Generic drug: tiotropium INHALE ONE DOSE BY MOUTH ONCE DAILY   tamsulosin 0.4 MG Caps capsule Commonly known as: FLOMAX Take 1 capsule (0.4 mg total) by mouth daily.   torsemide 20 MG tablet Commonly known as: DEMADEX Take 1 tablet (20 mg total) by mouth 2 (two) times daily.   traMADol 50 MG tablet Commonly known as: ULTRAM Take 1 tablet (50 mg total) by mouth every 6 (six) hours as needed for moderate pain.   Vitamin D3 25 MCG tablet Commonly known as: Vitamin D Take 1 tablet (1,000 Units total) by mouth daily. Start taking on: October 02, 2021   warfarin 5 MG tablet Commonly known as: Coumadin Take 1 tablet (5 mg total) by mouth daily.        Allergies  Allergen Reactions   Calcium Channel Blockers Other (See Comments)    Came to hospital in 1995-caused chest pain     Consultations: PCCM, cardiology, palliative care   Procedures/Studies: DG Abd 1 View  Result Date: 09/03/2021 CLINICAL DATA:  OG tube placement. EXAM: ABDOMEN - 1 VIEW COMPARISON:  Chest x-ray of the same date. Recent abdominal CT from April of 2023. FINDINGS: Gastric tube, tip in the antro pyloric region. Small bowel distension up to 3.7 cm, bowel gas pattern incompletely assessed. Stool and gas in the colon. Moderate stool likely in ascending colon. IMPRESSION: Gastric tube with tip in the antropyloric region. Small bowel distension, incompletely assessed. Full extent of the abdomen is incompletely evaluated. Findings could reflect ileus but would suggest follow-up abdominal assessments to distinguish between developing small bowel obstruction. Electronically Signed   By: Zetta Bills M.D.   On: 09/03/2021 10:41   CT HEAD WO CONTRAST (5MM)  Result Date: 09/05/2021 CLINICAL DATA:  Altered  mental status EXAM: CT HEAD WITHOUT CONTRAST TECHNIQUE: Contiguous axial images were obtained from the base of the skull through the vertex without intravenous contrast. RADIATION DOSE REDUCTION: This exam was performed according to the departmental dose-optimization program which includes automated exposure control, adjustment of the mA and/or kV according to patient size and/or use of iterative reconstruction technique. COMPARISON:  07/06/2021 FINDINGS: Brain: Left frontal lobe encephalomalacia with ex vacuo dilatation of the left lateral ventricle, stable in appearance from prior. No evidence of acute infarction, hemorrhage, hydrocephalus, extra-axial collection, or mass lesion. Patchy low-density changes  within the periventricular and subcortical white matter compatible with chronic microvascular ischemic change. Mild diffuse cerebral volume loss. Vascular: No hyperdense vessel or unexpected calcification. Skull: Prior left frontal craniotomy. Negative for acute calvarial fracture. Sinuses/Orbits: Complete opacification of the right sphenoid sinus with mucosal thickening in the posterior right ethmoid air cells. Other: None. IMPRESSION: No acute intracranial findings.  Stable exam. Electronically Signed   By: Davina Poke D.O.   On: 09/05/2021 16:21   CT HEAD WO CONTRAST (5MM)  Result Date: 09/02/2021 CLINICAL DATA:  Altered level of consciousness EXAM: CT HEAD WITHOUT CONTRAST TECHNIQUE: Contiguous axial images were obtained from the base of the skull through the vertex without intravenous contrast. RADIATION DOSE REDUCTION: This exam was performed according to the departmental dose-optimization program which includes automated exposure control, adjustment of the mA and/or kV according to patient size and/or use of iterative reconstruction technique. COMPARISON:  None Available. FINDINGS: Brain: Left frontal lobe encephalomalacia with ex vacuo dilatation of the left lateral ventricle. Scattered  hypodensities throughout the periventricular white matter and bilateral basal ganglia consistent with age-indeterminate small vessel ischemic changes, likely chronic. No other signs of acute infarct or hemorrhage. Lateral ventricles and midline structures are otherwise unremarkable. No acute extra-axial fluid collections. No mass effect. Vascular: No hyperdense vessel or unexpected calcification. Skull: Prior left frontal craniotomy.  No acute displaced fracture. Sinuses/Orbits: Polypoid mucosal thickening right posterior ethmoid air cells and right sphenoid sinus. Other: None. IMPRESSION: 1. Prior left frontal craniotomy with underlying encephalomalacia. 2. Scattered hypodensities within the periventricular white matter and bilateral basal ganglia, most consistent with chronic small vessel ischemic change. 3. Otherwise no acute intracranial process. Electronically Signed   By: Randa Ngo M.D.   On: 09/02/2021 19:16   DG CHEST PORT 1 VIEW  Result Date: 09/25/2021 CLINICAL DATA:  Hypertension EXAM: PORTABLE CHEST 1 VIEW COMPARISON:  Chest radiograph dated Sep 16, 2021 FINDINGS: The heart is enlarged. Atherosclerotic calcification of the aortic arch. Pacemaker leads in the right atrium and right ventricle, unchanged. Left basilar subsegmental linear atelectasis. Lungs are otherwise clear without evidence of focal consolidation or pleural effusion. No acute osseous abnormality. IMPRESSION: 1. Stable cardiomegaly. 2. Left basilar linear atelectasis, unchanged. No focal consolidation or pleural effusion. Electronically Signed   By: Keane Police D.O.   On: 09/25/2021 17:03   DG CHEST PORT 1 VIEW  Result Date: 09/16/2021 CLINICAL DATA:  Short of breath. EXAM: PORTABLE CHEST 1 VIEW COMPARISON:  09/09/2021 FINDINGS: Cardiac enlargement without heart failure. Pacemaker unchanged. Endotracheal tube and NG tube have been removed in the interval. Left lower lobe airspace disease with slight progression. Right lung  clear. IMPRESSION: Increased left lower lobe airspace disease which may be atelectasis. Electronically Signed   By: Franchot Gallo M.D.   On: 09/16/2021 12:45   DG CHEST PORT 1 VIEW  Result Date: 09/09/2021 CLINICAL DATA:  Pneumonia EXAM: PORTABLE CHEST 1 VIEW COMPARISON:  09/05/2021 FINDINGS: Mild cardiac enlargement. Normal vascularity. Negative for heart failure. Endotracheal tube in good position. NG tube in the stomach. Right jugular central venous catheter tip mid SVC. Pacemaker leads unchanged. Left lower lobe airspace disease unchanged. Improvement in mild right lower lobe airspace disease. IMPRESSION: Support lines remain in good position Left lower lobe atelectasis/infiltrate unchanged. Mild improved aeration right lung base. Improved lung volume compared with the prior study. Electronically Signed   By: Franchot Gallo M.D.   On: 09/09/2021 11:12   DG CHEST PORT 1 VIEW  Result Date: 09/05/2021 CLINICAL DATA:  76 year old male with respiratory distress. Intubated. EXAM: PORTABLE CHEST 1 VIEW COMPARISON:  Portable chest 09/03/2021 and earlier. FINDINGS: Portable AP semi upright view at 0442 hours. Stable lines and tubes. Stable left chest pacemaker. Moderately regressed Patchy and confluent right lung opacity, mild residual. Mildly lower lung volumes. No pneumothorax. Small left pleural effusion is possible. And left lung base retrocardiac opacity has mildly increased. But elsewhere the left lung is well ventilated. Mediastinal contours remain normal. Paucity of bowel gas in the upper abdomen. No acute osseous abnormality identified. IMPRESSION: 1. Stable lines and tubes. 2. Moderately improved right lung ventilation and regressed opacity since 09/03/2021. 3. Mildly increased left lung base opacity, but favor atelectasis and/or small left pleural effusion. Electronically Signed   By: Genevie Ann M.D.   On: 09/05/2021 05:48   DG CHEST PORT 1 VIEW  Result Date: 09/03/2021 CLINICAL DATA:  Central line  placement. EXAM: PORTABLE CHEST 1 VIEW COMPARISON:  09/03/2021, earlier same day FINDINGS: 1026 hours. Endotracheal tube tip is 3.6 cm above the base of the carina. The NG tube passes into the stomach although the distal tip position is not included on the film. Right IJ central line tip overlies the proximal to mid SVC level. Diffuse airspace disease noted right lung with relative sparing of the left lung. No substantial pleural effusion. The cardio pericardial silhouette is enlarged. Left permanent pacemaker noted. Telemetry leads overlie the chest. IMPRESSION: 1. Support apparatus as described. No evidence for right-sided pneumothorax or pleural effusion. 2. Diffuse right lung airspace disease with relative sparing of the left lung. Given rapid interval appearance, aspiration would be a consideration. Asymmetric pulmonary edema could have this appearance. Electronically Signed   By: Misty Stanley M.D.   On: 09/03/2021 10:41   DG Chest Port 1 View  Result Date: 09/03/2021 CLINICAL DATA:  Acute respiratory failure EXAM: PORTABLE CHEST 1 VIEW COMPARISON:  08/30/2021 FINDINGS: Chronic cardiomegaly with biventricular pacer. Stable fullness of the hila which appears vascular. No air bronchogram, Kerley lines, effusion, or pneumothorax. Artifact from EKG leads. IMPRESSION: Stable cardiomegaly and vascular congestion. Electronically Signed   By: Jorje Guild M.D.   On: 09/03/2021 07:05   DG Swallowing Func-Speech Pathology  Result Date: 09/16/2021 Table formatting from the original result was not included. Objective Swallowing Evaluation: Type of Study: MBS-Modified Barium Swallow Study  Patient Details Name: Thimothy Barretta MRN: [id number] Date of Birth: May 02, 1946 Today's Date: 09/16/2021 Time: SLP Start Time (ACUTE ONLY): 18 -SLP Stop Time (ACUTE ONLY): 1046 SLP Time Calculation (min) (ACUTE ONLY): 16 min Past Medical History: Past Medical History: Diagnosis Date  Arthritis   osteoarthritis of left knee   Ascending aorta dilatation (HCC)   Balanitis   recurrent  Cardiac arrhythmia   life threatening, secondary to CCB vs b- blockers  Cardiomyopathy ([place])   Chronic joint pain   Chronic systolic CHF (congestive heart failure) (HCC)   CKD (chronic kidney disease), stage III (HCC)   COPD (chronic obstructive pulmonary disease) ([place])   Coronary artery disease   Dilated aortic root (HCC)   Enlarged prostate   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 Sherwood Gambler  Nocturia   Obesity   Pansinusitis   a.  complicated by brain abscess and bleeding requiring craniotomy in 1995.  PONV (postoperative nausea and vomiting)   Sinusitis  s/p ethmoidectomy and nasal septoplasty  Vertigo   intermitantly Past Surgical History: Past Surgical History: Procedure Laterality Date  BIV UPGRADE N/A 01/29/2020  Procedure: BIV UPGRADE;  Surgeon: Evans Lance, MD;  Location: [facility];  Service: Cardiovascular;  Laterality: N/A;  CIRCUMCISION N/A 11/20/2013  Procedure: CIRCUMCISION ADULT;  Surgeon: Claybon Jabs, MD;  Location: [facility];  Service: Urology;  Laterality: N/A;  [place]  hematomy due to sinus infection   PACEMAKER IMPLANT N/A 01/17/2020  Procedure: PACEMAKER IMPLANT;  Surgeon: Deboraha Sprang, MD;  Location: [facility] [place] [facility];  Service: Cardiovascular;  Laterality: N/A;  RIGHT/LEFT HEART CATH AND CORONARY ANGIOGRAPHY N/A 09/20/2019  Procedure: RIGHT/LEFT HEART CATH AND CORONARY ANGIOGRAPHY;  Surgeon: Jolaine Artist, MD;  Location: [facility];  Service: Cardiovascular;  Laterality: N/A;  shoulder surg rt   1995  SINUS SURGERY WITH INSTATRAK    ethmoidectomy and nasal septum repair HPI: Mr. Bouknight is a 76 yo gentleman w/ pertinent PMH of chronic HFrEF with ICD, a-fib, CKD stage IIIb, COPD who presented to [facility] on 5/2 w/ hematuria x 3  weeks. The week prior to admission he was started on Bactrim for urine cultures showing proteus mirabilis. He began having worsening fever and SOB and was brought to [facility] ED on 4/27.  At admission he was initially started on BiPAP for SOB. Fever 103 F. Started on empiric abx. CT abdomen showing nonobstructive nephrolithiasis. Urology consulted with no indication for procedure.  Further work up showing sepsis from UTI w/ pyelonephritis. Blood cultures were positive for proteus mirabilis. On Ancef with ID managing antibiotics.  On 5/2 he began having decreased LOC and increased O2 requirements. Intubated 5/3 with short PEA arrest after intubation. Extubated 5/12.  No data recorded  Recommendations for follow up therapy are one component of a multi-disciplinary discharge planning process, led by the attending physician.  Recommendations may be updated based on patient status, additional functional criteria and insurance authorization. Assessment / Plan / Recommendation   09/16/2021  10:00 AM Clinical Impressions Clinical Impression Patient presents with mild pharyngeal phase dysphagia characterized primarily be decreased laryngeal closure s/p 10 day intubation, resulting in deep penetration of thin and nectar thick liquids. Penetrates almost reached the cords, clearing with inconsistent throat clear. Cued throat clear successful as well. Post swallow, he does have mild vallecular and pyriform sinus residue due to decreased hyolaryngeal movement which clears with typically spontaneous dry swallow. Recommend continuing current diet (patient with slow but efficient mastication) with aspiration precautions. He will benefit from continued SLP f/u to reinforce compensatory strategies to mitigate aspiration risk. SLP Visit Diagnosis Dysphagia, pharyngeal phase (R13.13) Impact on safety and function Mild aspiration risk     09/16/2021  10:00 AM Treatment Recommendations Treatment Recommendations Therapy as outlined in treatment  plan below     09/16/2021  10:00 AM Prognosis Prognosis for Safe Diet Advancement Good   09/16/2021  10:00 AM Diet Recommendations SLP Diet Recommendations Dysphagia 3 (Mech soft) solids;Thin liquid Liquid Administration via Cup;Straw Medication Administration Whole meds with puree Compensations Slow rate;Small sips/bites;Clear throat intermittently Postural Changes Remain semi-upright after after feeds/meals (Comment)     09/16/2021  10:00 AM Other Recommendations Oral Care Recommendations Oral care BID Follow Up Recommendations No SLP follow up Assistance recommended at discharge None   09/13/2021   8:00 AM Frequency and Duration  Speech Therapy Frequency (ACUTE ONLY) min 2x/week Treatment Duration 1 week     09/16/2021  10:00 AM Oral  Phase Oral Phase [facility]    09/16/2021  10:00 AM Pharyngeal Phase Pharyngeal Phase Impaired Pharyngeal- Nectar Teaspoon NT Pharyngeal- Nectar Cup Reduced airway/laryngeal closure;Pharyngeal residue - valleculae;Pharyngeal residue - pyriform;Penetration/Aspiration during swallow;Reduced anterior laryngeal mobility;Reduced laryngeal elevation Pharyngeal Material enters airway, remains ABOVE vocal cords and not ejected out Pharyngeal- Nectar Straw NT Pharyngeal- Thin Teaspoon NT Pharyngeal- Thin Cup Reduced airway/laryngeal closure;Pharyngeal residue - valleculae;Pharyngeal residue - pyriform;Penetration/Aspiration during swallow;Reduced anterior laryngeal mobility;Reduced laryngeal elevation Pharyngeal Material enters airway, remains ABOVE vocal cords and not ejected out Pharyngeal- Thin Straw Reduced airway/laryngeal closure;Pharyngeal residue - valleculae;Pharyngeal residue - pyriform;Penetration/Aspiration during swallow;Reduced anterior laryngeal mobility;Reduced laryngeal elevation Pharyngeal Material enters airway, remains ABOVE vocal cords and not ejected out Pharyngeal- Puree Pharyngeal residue - pyriform;Pharyngeal residue - valleculae;Reduced anterior laryngeal mobility;Reduced  laryngeal elevation Pharyngeal Material does not enter airway Pharyngeal- Mechanical Soft Pharyngeal residue - valleculae;Pharyngeal residue - pyriform;Reduced anterior laryngeal mobility;Reduced laryngeal elevation Pharyngeal Material does not enter airway Pharyngeal- Pill [facility]    09/16/2021  10:00 AM Cervical Esophageal Phase  Cervical Esophageal Phase [facility] Gabriel Rainwater MA, CCC-SLP [place] Leah Meryl 09/16/2021, 11:02 AM                     EEG adult  Result Date: 09/04/2021 Lora Havens, MD     09/04/2021 10:59 AM Patient Name: Parth Mccormac MRN: [id number] Epilepsy Attending: Lora Havens Referring Physician/Provider: Lanier Clam, MD Date: 09/04/2021 Duration: 34.45 mins Patient history: 76yo M with ams. EEG to evaluate for seizure Level of alertness: lethargic AEDs during EEG study: Phenobarb Technical aspects: This EEG study was done with scalp electrodes positioned according to the 10-20 International system of electrode placement. Electrical activity was acquired at a sampling rate of 500Hz  and reviewed with a high frequency filter of 70Hz  and a low frequency filter of 1Hz . EEG data were recorded continuously and digitally stored. Description: EEG showed continuous generalized 3 to 7 Hz theta-delta slowing. Hyperventilation and photic stimulation were not performed.   ABNORMALITY - Continuous slow, generalized IMPRESSION: This study is suggestive of moderate to severe diffuse encephalopathy, nonspecific etiology. No seizures or epileptiform discharges were seen throughout the recording. Lora Havens   ECHO TEE  Result Date: 09/26/2021    TRANSESOPHOGEAL ECHO REPORT   Patient Name:   PABLO STAUFFER Date of Exam: 09/26/2021 Medical Rec #:  [id number]     Height:       67.0 in Accession #:    [id number]    Weight:       288.8 lb Date of Birth:  [birth year]    BSA:          2.364 m Patient Age:    80 years      BP:           84/60 mmHg Patient Gender: M             HR:           124 bpm. Exam  Location:  Inpatient Procedure: Transesophageal Echo and Color Doppler Indications:     A-flutter  History:         Patient has prior history of Echocardiogram examinations, most                  recent 08/29/2021. CHF, Defibrillator; Arrythmias:Atrial                  Flutter. NICM.  Sonographer:     Eartha Inch Sonographer#2:   Clayton Lefort RDCS (  AE) Referring Phys:  [id number] [facility] [place] Diagnosing Phys: [name] MD PROCEDURE: After discussion of the risks and benefits of a TEE, an informed consent was obtained from the patient. The transesophogeal probe was passed without difficulty through the esophogus of the patient. Sedation performed by different physician. The patient was monitored while under deep sedation. Anesthestetic sedation was provided intravenously by Anesthesiology: 145.59mg  of Propofol. Image quality was good. The patient's vital signs; including heart rate, blood pressure, and oxygen saturation; remained stable throughout the procedure. The patient developed no complications during the procedure. A successful direct current cardioversion was performed at 200 joules with 1 attempt. IMPRESSIONS  1. Left ventricular ejection fraction, by estimation, is 30 to 35%. The left ventricle has moderately decreased function. The left ventricle demonstrates global hypokinesis.  2. Right ventricular systolic function is normal. The right ventricular size is normal.  3. No left atrial/left atrial appendage thrombus was detected.  4. The mitral valve is normal in structure. Trivial mitral valve regurgitation. No evidence of mitral stenosis.  5. The aortic valve is tricuspid. Aortic valve regurgitation is trivial. No aortic stenosis is present.  6. The inferior vena cava is normal in size with greater than 50% respiratory variability, suggesting right atrial pressure of 3 mmHg. Conclusion(s)/Recommendation(s): Normal biventricular function without evidence of hemodynamically significant valvular  heart disease. FINDINGS  Left Ventricle: Left ventricular ejection fraction, by estimation, is 30 to 35%. The left ventricle has moderately decreased function. The left ventricle demonstrates global hypokinesis. The left ventricular internal cavity size was normal in size. There is no left ventricular hypertrophy. Right Ventricle: The right ventricular size is normal. No increase in right ventricular wall thickness. Right ventricular systolic function is normal. Left Atrium: Left atrial size was normal in size. No left atrial/left atrial appendage thrombus was detected. Right Atrium: Right atrial size was normal in size. Pericardium: There is no evidence of pericardial effusion. Mitral Valve: The mitral valve is normal in structure. Trivial mitral valve regurgitation. No evidence of mitral valve stenosis. Tricuspid Valve: The tricuspid valve is normal in structure. Tricuspid valve regurgitation is not demonstrated. No evidence of tricuspid stenosis. Aortic Valve: The aortic valve is tricuspid. Aortic valve regurgitation is trivial. No aortic stenosis is present. Pulmonic Valve: The pulmonic valve was normal in structure. Pulmonic valve regurgitation is trivial. No evidence of pulmonic stenosis. Aorta: The aortic root is normal in size and structure. Venous: The inferior vena cava is normal in size with greater than 50% respiratory variability, suggesting right atrial pressure of 3 mmHg. IAS/Shunts: No atrial level shunt detected by color flow Doppler. [name] MD Electronically signed by [name] MD Signature Date/Time: 09/26/2021/3:42:25 PM    Final       Subjective: Patient seen and examined at the bedside this morning.  Hemodynamically stable.  In normal sinus rhythm.  Denies any shortness of breath or cough or chest pain.  Was complaining of dizziness with other staff but he did not complain of anything to me.  I think he is stable for discharge to CIR.  I called his daughter [name] and had a  long discussion about discharge planning.Marland Kitchen  Discharge Exam: Vitals:   10/01/21 0029 10/01/21 0513  BP: 128/80 136/67  Pulse: 60 63  Resp: 16 18  Temp: 97.6 F (36.4 C) 98.4 F (36.9 C)  SpO2: 100% 98%   Vitals:   09/30/21 1606 09/30/21 2134 10/01/21 0029 10/01/21 0513  BP: 114/87 126/74 128/80 136/67  Pulse:  60 60 63  Resp: 18 16 16 18   Temp: 97.9 F (36.6 C) 97.7 F (36.5 C) 97.6 F (36.4 C) 98.4 F (36.9 C)  TempSrc: Oral Oral Oral Oral  SpO2:  100% 100% 98%  Weight:      Height:        General: Pt is alert, awake, not in acute distress,obese Cardiovascular: RRR, S1/S2 +, no rubs, no gallops Respiratory: CTA bilaterally, no wheezing, no rhonchi Abdominal: Soft, NT, ND, bowel sounds + Extremities: no edema, no cyanosis    The results of significant diagnostics from this hospitalization (including imaging, microbiology, ancillary and laboratory) are listed below for reference.     Microbiology: No results found for this or any previous visit (from the past 240 hour(s)).   Labs: BNP (last 3 results) Recent Labs    09/03/21 0850 09/09/21 0415 09/10/21 0310  BNP 230.3* 53.6 09.8   Basic Metabolic Panel: Recent Labs  Lab 09/25/21 0545 09/26/21 0052 09/27/21 0132 09/30/21 0706 10/01/21 0712  NA 138 136 140 139 140  K 3.9 4.1 4.2 3.9 4.3  CL 102 102 103 105 106  CO2 29 29 31 31 31   GLUCOSE 106* 111* 88 82 84  BUN 44* 36* 26* 13 13  CREATININE 1.50* 1.52* 1.38* 1.14 1.18  CALCIUM 9.6 9.3 9.1 9.4 9.6  MG  --  2.1 2.0  --   --   PHOS  --  3.8  --   --   --    Liver Function Tests: No results for input(s): AST, ALT, ALKPHOS, BILITOT, PROT, ALBUMIN in the last 168 hours. No results for input(s): LIPASE, AMYLASE in the last 168 hours. No results for input(s): AMMONIA in the last 168 hours. CBC: Recent Labs  Lab 09/25/21 1614 09/26/21 0052 09/27/21 0132 09/30/21 0706 10/01/21 0712  WBC 6.1 5.4 5.0 4.5 5.0  HGB 10.4* 10.3* 9.3* 10.0* 10.2*  HCT  32.0* 31.9* 29.2* 31.1* 32.5*  MCV 95.0 95.5 96.4 96.6 97.3  PLT 169 177 147* 115* 113*   Cardiac Enzymes: No results for input(s): CKTOTAL, CKMB, CKMBINDEX, TROPONINI in the last 168 hours. BNP: Invalid input(s): POCBNP CBG: Recent Labs  Lab 09/30/21 0735 09/30/21 1150 09/30/21 1603 09/30/21 2136 10/01/21 0754  GLUCAP 72 91 121* 124* 76   D-Dimer No results for input(s): DDIMER in the last 72 hours. Hgb A1c No results for input(s): HGBA1C in the last 72 hours. Lipid Profile No results for input(s): CHOL, HDL, LDLCALC, TRIG, CHOLHDL, LDLDIRECT in the last 72 hours. Thyroid function studies No results for input(s): TSH, T4TOTAL, T3FREE, THYROIDAB in the last 72 hours.  Invalid input(s): FREET3 Anemia work up No results for input(s): VITAMINB12, FOLATE, FERRITIN, TIBC, IRON, RETICCTPCT in the last 72 hours. Urinalysis    Component Value Date/Time   COLORURINE YELLOW 08/28/2021 2337   APPEARANCEUR HAZY (A) 08/28/2021 2337   APPEARANCEUR Clear 04/01/2015 1427   LABSPEC 1.009 08/28/2021 2337   PHURINE 6.0 08/28/2021 2337   GLUCOSEU >=500 (A) 08/28/2021 2337   GLUCOSEU NEG mg/dL 01/10/2008 2127   HGBUR LARGE (A) 08/28/2021 2337   BILIRUBINUR NEGATIVE 08/28/2021 2337   BILIRUBINUR negative 08/23/2021 1421   BILIRUBINUR Negative 04/01/2015 1427   KETONESUR NEGATIVE 08/28/2021 2337   PROTEINUR 30 (A) 08/28/2021 2337   UROBILINOGEN 0.2 08/23/2021 1421   UROBILINOGEN 1 10/05/2012 1500   NITRITE NEGATIVE 08/28/2021 2337   LEUKOCYTESUR MODERATE (A) 08/28/2021 2337   Sepsis Labs Invalid input(s): PROCALCITONIN,  WBC,  LACTICIDVEN Microbiology No results found for this  or any previous visit (from the past 240 hour(s)).  Please note: You were cared for by a hospitalist during your hospital stay. Once you are discharged, your primary care physician will handle any further medical issues. Please note that NO REFILLS for any discharge medications will be authorized once you are  discharged, as it is imperative that you return to your primary care physician (or establish a relationship with a primary care physician if you do not have one) for your post hospital discharge needs so that they can reassess your need for medications and monitor your lab values.    Time coordinating discharge: 40 minutes  SIGNED:   Shelly Coss, MD  [facility] 10/01/2021, 11:17 AM Pager [phone]  If 7PM-7AM, please contact night-coverage [email or web address] Password [email or web address]

## 2021-10-01 NOTE — Progress Notes (Addendum)
Ricky Hayden for enoxaparin> warfarin Indication: atrial fibrillation  Allergies  Allergen Reactions   Calcium Channel Blockers Other (See Comments)    Came to hospital in 1995-caused chest pain     Patient Measurements: Height: 5\' 7"  (170.2 cm) Weight: 134 kg (295 lb 6.7 oz) IBW/kg (Calculated) : 66.1  Vital Signs: Temp: 98.4 F (36.9 C) (05/31 0513) Temp Source: Oral (05/31 0855) BP: 103/65 (05/31 1124) Pulse Rate: 60 (05/31 1124)  Labs: Recent Labs    09/30/21 0706 09/30/21 1631 10/01/21 0712  HGB 10.0*  --  10.2*  HCT 31.1*  --  32.5*  PLT 115*  --  113*  LABPROT  --  14.2  --   INR  --  1.1  --   CREATININE 1.14  --  1.18    Estimated Creatinine Clearance: 69.2 mL/min (by C-G formula based on SCr of 1.18 mg/dL).   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 Sherwood Gambler   Nocturia    Obesity    Pansinusitis    a.  complicated by brain abscess and bleeding requiring craniotomy in 1995.   PONV (postoperative nausea and vomiting)    Sinusitis    s/p ethmoidectomy and nasal septoplasty   Vertigo    intermitantly   Assessment: 76 year old male with new PAF. Patient recently had TEE/DCCV on 5/26. Patient started on  enoxaparin and warfarin bridge last night 5/30.   Per [place] comprehensive anticoagulation / drug interaction document   [email or web address]://sites.[employer].no  Phenobarbital also interacts with endoxaban, ruling out using any DOAC for anticoagulation. Discussed situation with cardiology this am 5/31.   Recommend continuing enoxaparin to warfarin bridge to therapeutic INR of 2-3 after recent DCCV.   INR normal at 1.1 this morning, Hgb stable overnight, plt are down to 110s. No bleeding issues noted.   Goal of Therapy:  INR 2-3 Heparin level 0.6-1.0 units/ml Monitor platelets by anticoagulation protocol: Yes   Plan:  Lovenox 130mg  q12 hours Warfarin 7.5mg  tonight Daily INR and CBC every 72 hours while on warfarin  Erin Hearing PharmD., BCPS Clinical Pharmacist 10/01/2021 1:28 PM

## 2021-10-01 NOTE — Progress Notes (Signed)
Inpatient Rehab Admissions Coordinator:   I have approval from the [place] and a bed for pt to re-admit to CIR today.  I let daughter, [name], know over the phone, and I will update pt at bedside.  TOC aware.    Shann Medal, PT, DPT Admissions Coordinator [phone] 10/01/21  10:57 AM

## 2021-10-01 NOTE — Plan of Care (Signed)

## 2021-10-01 NOTE — H&P (Signed)
Original H &P done on 09/19/21 but was deleted by mistake. Note has been copied and pasted back to ensure complete documentation.   Physical Medicine and Rehabilitation Admission H&P        Chief Complaint  Patient presents with   Debility due to multiple medical issues.       HPI: Ricky Hayden is a 76 year old male with history of CKD- baseline SCr- 1.5, COPD, CAD/cardiac arrhthymias s/p ICD/PPM, A fib- on eliquis, seizure disorder (on phenobarbital), recent UTI  who was admitted on 08/29/21 with fever T-103, hematuria and chills due urospesis. He was started on IV antibiotics. CT abdomen showed distended bladder with non-obstructing renal stones and subcentimeter exophytic lesion on left kidney. Dr. Cain Sieve recommended bladder scan to monitor voiding, foley for retention and cysto on outpatient basis. BC positive for Proteus with ongoing fevers and ceftriaxone broadened to Cefazolin X 14 days per Dr. Linus Salmons.   On 05/02, he had decrease in LOC with increase in oxygen requirement and required intubation as well as brief PEA arrest after intubation. CXR showed right infiltrate concerning for aspiration event with HCAP.    Hypotension due to septic shock treated with fluid bolus and pressors. He had decrease in UOP as well as concerns of overload treated with and had worsening of renal status. Acute on chronic renal failure felt to be due to ATN secondary to  severe UTI/sepsis/shock per Dr. Posey Pronto.  EEG was negative for seizure. CT head negative for acute changes. Antibiotics transitioned to Ampicillin sulbactam with end date 5/10. He tolerated extubation by 05/12 and placed on D3, thins due to prolonged oral phase/mastication.  AKI resolved with renal function at baseline and nephrology signed off. Stress dose steriods weaned off with improvement in BS. Delirium is resolving but he continues to have delay in processing, decreased awareness of deficits, fatigue, generalized weakness and poor balance with  decrease in postural reaction. PT/OT working with patient and CIR recommended due to functional decline. Currently complains of shortness of breath.    Review of Systems  Constitutional:  Negative for chills and fever.  HENT:  Negative for hearing loss and tinnitus.   Eyes:  Negative for blurred vision.  Respiratory:  Positive for shortness of breath.   Cardiovascular:  Negative for chest pain and leg swelling.  Gastrointestinal:  Negative for heartburn and nausea.  Genitourinary:  Positive for frequency. Negative for dysuria.  Musculoskeletal:  Positive for myalgias (hurts all over).  Neurological:  Positive for weakness. Negative for dizziness and headaches.  Psychiatric/Behavioral:  Positive for memory loss.        Has had issues with disorientation at nights with gait disorder.           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 Sherwood Gambler   Nocturia     Obesity     Pansinusitis      a.  complicated by brain abscess and bleeding requiring craniotomy in 1995.   PONV (postoperative nausea and vomiting)     Sinusitis      s/p ethmoidectomy and nasal septoplasty   Vertigo      intermitantly           Past Surgical History:  Procedure Laterality Date   BIV UPGRADE  N/A 01/29/2020    Procedure: BIV UPGRADE;  Surgeon: Evans Lance, MD;  Location: [facility];  Service: Cardiovascular;  Laterality: N/A;   CIRCUMCISION N/A 11/20/2013    Procedure: CIRCUMCISION ADULT;  Surgeon: Claybon Jabs, MD;  Location: [facility];  Service: Urology;  Laterality: N/A;   [place]    hematomy due to sinus infection    PACEMAKER IMPLANT N/A 01/17/2020    Procedure: PACEMAKER IMPLANT;  Surgeon: Deboraha Sprang, MD;  Location: [facility];  Service: Cardiovascular;  Laterality: N/A;   RIGHT/LEFT HEART CATH AND CORONARY ANGIOGRAPHY N/A 09/20/2019    Procedure: RIGHT/LEFT HEART CATH AND CORONARY ANGIOGRAPHY;  Surgeon: Jolaine Artist, MD;  Location: [facility];  Service: Cardiovascular;  Laterality: N/A;   shoulder surg rt    1995   SINUS SURGERY WITH INSTATRAK        ethmoidectomy and nasal septum repair           Family History  Problem Relation Age of Onset   Heart failure Mother 45   Heart attack Father 47      Social History:  Widowed--children have been spending nights for past 2 years for safety. Has four children in town who provide meals/home management.  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 He  reports that he quit smoking about 35 years ago. He has never used smokeless tobacco. He reports that he does not drink alcohol and does not use drugs.          Allergies  Allergen Reactions   Calcium Channel Blockers Other (See Comments)      Came to hospital in 1995-caused chest pain              Medications Prior to Admission  Medication Sig Dispense Refill   acetaminophen (TYLENOL) 500 MG tablet Take 1,000 mg by mouth every 6 (six) hours as needed for moderate pain or headache.       albuterol (PROVENTIL HFA;VENTOLIN HFA) 108 (90 BASE) MCG/ACT inhaler Inhale 2 puffs into the lungs every 6 (six) hours as needed for wheezing. 1 Inhaler 11   amiodarone (PACERONE) 200  MG tablet Take 1 tablet (200 mg total) by mouth daily. 90 tablet 3   apixaban (ELIQUIS) 5 MG TABS tablet Take 1 tablet by mouth in the morning and at bedtime.       budesonide-formoterol (SYMBICORT) 80-4.5 MCG/ACT inhaler Inhale 2 puffs into the lungs 2 (two) times daily. 1 Inhaler 12   carvedilol (COREG) 6.25 MG tablet Take 1 tablet (6.25 mg total) by mouth 2 (two) times daily with a meal. 180 tablet 3   cetirizine (ZYRTEC) 10 MG tablet Take 1 tablet by mouth daily.  diclofenac Sodium (VOLTAREN) 1 % GEL Apply 2 g topically daily as needed (pain).        docusate sodium (COLACE) 100 MG capsule Take 1-2 capsules (100-200 mg total) by mouth 2 (two) times daily as needed for mild constipation. 180 capsule 3   empagliflozin (JARDIANCE) 10 MG TABS tablet Take 1 tablet (10 mg total) by mouth daily before breakfast. 90 tablet 3   feeding supplement (ENSURE ENLIVE / ENSURE PLUS) LIQD Take 237 mLs by mouth 2 (two) times daily between meals. 237 mL 12   finasteride (PROSCAR) 5 MG tablet TAKE ONE TABLET BY MOUTH ONCE DAILY (Patient taking differently: Take 5 mg by mouth daily.) 30 tablet 11   GINKGO BILOBA PO Take 2 tablets by mouth at bedtime.       [START ON 09/20/2021] insulin glargine-yfgn (SEMGLEE) 100 UNIT/ML injection Inject 0.08 mLs (8 Units total) into the skin daily. 10 mL 11   Multiple Vitamins-Minerals (ICAPS AREDS 2 PO) Take 2 tablets by mouth daily.       omeprazole (PRILOSEC) 20 MG capsule Take 1 capsule (20 mg total) by mouth daily. 30 capsule 11   PHENobarbital (LUMINAL) 64.8 MG tablet Take 1 tablet (64.8 mg total) by mouth 2 (two) times daily. 180 tablet 3   polyethylene glycol (MIRALAX / GLYCOLAX) packet Take 17 g by mouth daily as needed for moderate constipation. 14 each 0   pravastatin (PRAVACHOL) 40 MG tablet Take 1 tablet (40 mg total) by mouth every evening. 90 tablet 3   sacubitril-valsartan (ENTRESTO) 24-26 MG Take 1 tablet by mouth 2 (two) times daily. 180 tablet 3   SPIRIVA  HANDIHALER 18 MCG inhalation capsule INHALE ONE DOSE BY MOUTH ONCE DAILY (Patient not taking: Reported on 08/29/2021) 30 capsule 11   tamsulosin (FLOMAX) 0.4 MG CAPS capsule Take 1 capsule (0.4 mg total) by mouth daily. 90 capsule 3   torsemide (DEMADEX) 20 MG tablet Take 1 tablet (20 mg total) by mouth 2 (two) times daily.       traMADol (ULTRAM) 50 MG tablet Take 1 tablet (50 mg total) by mouth every 6 (six) hours as needed for moderate pain. 30 tablet      Home: Home Living Family/patient expects to be discharged to:: Private residence Living Arrangements: Alone (adult children stay nights and weekends) Available Help at Discharge: Family, Available 24 hours/day (daugher available 24/7) Type of Home: House Home Access: Stairs to enter [employer] of Steps: 3 Entrance Stairs-Rails: None Home Layout: One level Bathroom Shower/Tub: Gaffer, Chiropodist: Handicapped height Bathroom Accessibility: Yes Home Equipment: Radiation protection practitioner (4 wheels), Hospital bed, Shower seat  Lives With: Daughter, Son   Functional History: Prior Function Prior Level of Function : Needs assist Mobility Comments: uses rollator for ambulation in community if longer distance; walks without device in home and short community distances, children mostly manage IADLs ADLs Comments: Needs help sometimes per report. Drives sometimes.   Functional Status:  Mobility: Bed Mobility Overal bed mobility: Needs Assistance Bed Mobility: Supine to Sit Rolling: Max assist Sidelying to sit: +2 for physical assistance, Max assist Supine to sit: Mod assist, +2 for physical assistance Sit to supine: +2 for physical assistance, Total assist General bed mobility comments: received sitting in recliner (transferred via maximove with nursing) Transfers Overall transfer level: Needs assistance Equipment used: Rolling walker (2 wheels) Transfers: Sit to/from Stand Sit to Stand: Max assist, +2  safety/equipment Bed to/from chair/wheelchair/BSC transfer type:: Stand pivot Stand pivot transfers: Max assist, +2  physical assistance Transfer via Lift Equipment: Stedy General transfer comment: Pt unable to advance his LEs during stand pivot transfer without an assistive device. Therapist had to assist with moving his foot with each small step. Ambulation/Gait Ambulation/Gait assistance: +2 safety/equipment, Mod assist, Max assist Gait Distance (Feet): 2 Feet Assistive device: Rolling walker (2 wheels) Gait Pattern/deviations: Step-to pattern, Decreased weight shift to left, Shuffle, Antalgic, Trunk flexed General Gait Details: pt able to take 4-5 shufflings steps forward with RW and mod-maxA to maintain upright posture; pt inconsistent with ability to weight shift and maintain trunk extension; difficulty accepting weight onto LLE in order to take complete step with R foot; increasing R lateral lean with fatigue; +2 assist for chair follow Gait velocity: decreased Pre-gait activities: weight shifts in stedy frame   ADL: ADL Overall ADL's : Needs assistance/impaired Eating/Feeding: Moderate assistance, Bed level Eating/Feeding Details (indicate cue type and reason): May benefit from adaptive utensils Grooming: Wash/dry face, Min guard, Dealer Details (indicate cue type and reason): sitting EOB unsupported Upper Body Bathing: Minimal assistance, Sitting Upper Body Bathing Details (indicate cue type and reason): sitting unsupported EOB Lower Body Bathing: +2 for physical assistance, Maximal assistance Upper Body Dressing : Sitting, Moderate assistance Upper Body Dressing Details (indicate cue type and reason): for donning hospital gown only Lower Body Dressing: Total assistance Lower Body Dressing Details (indicate cue type and reason): total assist for donning gripper socks Toilet Transfer: Maximal assistance, +2 for physical assistance, Stand-pivot Toilet Transfer  Details (indicate cue type and reason): simulated Toileting- Clothing Manipulation and Hygiene: +2 for safety/equipment, Maximal assistance Toileting - Clothing Manipulation Details (indicate cue type and reason): Currently with flexiseal. General ADL Comments: Pt able to sit EOB and work on bathing and dressing sit to stand.  Increased right lean/pushing noted with posterior pelvic tilt.  Pt resistant to therapist trying to help get him back to midline by pushing with the LUE on the bed.  Eventually, this improved slightly.  Oxygen sats 92% or greater on room air throughout activity.   Cognition: Cognition Overall Cognitive Status: Impaired/Different from baseline Orientation Level: Oriented to person, Oriented to place, Disoriented to time, Disoriented to situation Cognition Arousal/Alertness: Awake/alert Behavior During Therapy: Flat affect Overall Cognitive Status: Impaired/Different from baseline Area of Impairment: Attention, Problem solving, Awareness, Safety/judgement Orientation Level: Disoriented to, Time Current Attention Level: Sustained Memory: Decreased short-term memory Following Commands: Follows one step commands with increased time Safety/Judgement: Decreased awareness of deficits, Decreased awareness of safety Awareness: Intellectual Problem Solving: Slow processing, Difficulty sequencing, Requires verbal cues, Requires tactile cues, Decreased initiation General Comments: Pt needing increased time to process what he wanted to order for lunch, even when given choices.  He still was unable to choose within an appropriate period of time exhibiting greater than 20-30 second delay. Difficult to assess due to: Level of arousal   Blood pressure 103/61, pulse 75, temperature 98.2 F (36.8 C), resp. rate 18, SpO2 100 %. Physical Exam Vitals and nursing note reviewed. Initially sleeping in chair bent forward HENT:     Head:     Comments: Well healed old crani incision.   Pulmonary:     Comments: Decreased breath sounds.  Abdominal:     General: There is distension.     Tenderness: There is no abdominal tenderness.  Neurological:     Mental Status: He is easily aroused. He is lethargic and disoriented.     Comments: Oriented to self, place, age but thought it was October and  persevered on 8 as year of birth. Limited by lethargy but able to follow simple motor commands.    MSK: protracted posture. No focal deficits.         Results for orders placed or performed during the hospital encounter of 08/28/21 (from the past 48 hour(s))  Glucose, capillary     Status: Abnormal    Collection Time: 09/17/21  4:36 PM  Result Value Ref Range    Glucose-Capillary 133 (H) 70 - 99 mg/dL      Comment: Glucose reference range applies only to samples taken after fasting for at least 8 hours.  Glucose, capillary     Status: Abnormal    Collection Time: 09/17/21  9:09 PM  Result Value Ref Range    Glucose-Capillary 118 (H) 70 - 99 mg/dL      Comment: Glucose reference range applies only to samples taken after fasting for at least 8 hours.    Comment 1 Notify RN      Comment 2 Document in Chart    CBC     Status: Abnormal    Collection Time: 09/18/21 12:46 AM  Result Value Ref Range    WBC 7.9 4.0 - 10.5 K/uL    RBC 3.35 (L) 4.22 - 5.81 MIL/uL    Hemoglobin 10.4 (L) 13.0 - 17.0 g/dL    HCT 31.9 (L) 39.0 - 52.0 %    MCV 95.2 80.0 - 100.0 fL    MCH 31.0 26.0 - 34.0 pg    MCHC 32.6 30.0 - 36.0 g/dL    RDW 13.2 11.5 - 15.5 %    Platelets 227 150 - 400 K/uL    nRBC 0.0 0.0 - 0.2 %      Comment: Performed at [facility], [place] [address], [place], [place] [address]  Basic metabolic panel     Status: Abnormal    Collection Time: 09/18/21 12:46 AM  Result Value Ref Range    Sodium 141 135 - 145 mmol/L    Potassium 4.0 3.5 - 5.1 mmol/L    Chloride 107 98 - 111 mmol/L    CO2 29 22 - 32 mmol/L    Glucose, Bld 132 (H) 70 - 99 mg/dL      Comment: Glucose reference  range applies only to samples taken after fasting for at least 8 hours.    BUN 51 (H) 8 - 23 mg/dL    Creatinine, Ser 1.39 (H) 0.61 - 1.24 mg/dL    Calcium 8.8 (L) 8.9 - 10.3 mg/dL    GFR, Estimated 52 (L) >60 mL/min      Comment: (NOTE) Calculated using the CKD-EPI Creatinine Equation (2021)      Anion gap 5 5 - 15      Comment: Performed at [facility] [address], [place], [place] [address]  Glucose, capillary     Status: Abnormal    Collection Time: 09/18/21  6:14 AM  Result Value Ref Range    Glucose-Capillary 107 (H) 70 - 99 mg/dL      Comment: Glucose reference range applies only to samples taken after fasting for at least 8 hours.    Comment 1 Notify RN      Comment 2 Document in Chart    Glucose, capillary     Status: Abnormal    Collection Time: 09/18/21 11:19 AM  Result Value Ref Range    Glucose-Capillary 138 (H) 70 - 99 mg/dL      Comment: Glucose reference  range applies only to samples taken after fasting for at least 8 hours.  Glucose, capillary     Status: Abnormal    Collection Time: 09/18/21  4:37 PM  Result Value Ref Range    Glucose-Capillary 117 (H) 70 - 99 mg/dL      Comment: Glucose reference range applies only to samples taken after fasting for at least 8 hours.  Glucose, capillary     Status: Abnormal    Collection Time: 09/18/21  9:19 PM  Result Value Ref Range    Glucose-Capillary 159 (H) 70 - 99 mg/dL      Comment: Glucose reference range applies only to samples taken after fasting for at least 8 hours.    Comment 1 Notify RN      Comment 2 Document in Chart    Glucose, capillary     Status: Abnormal    Collection Time: 09/19/21  6:13 AM  Result Value Ref Range    Glucose-Capillary 121 (H) 70 - 99 mg/dL      Comment: Glucose reference range applies only to samples taken after fasting for at least 8 hours.    Comment 1 Notify RN      Comment 2 Document in Chart    Glucose, capillary     Status: Abnormal    Collection Time: 09/19/21 12:07  PM  Result Value Ref Range    Glucose-Capillary 119 (H) 70 - 99 mg/dL      Comment: Glucose reference range applies only to samples taken after fasting for at least 8 hours.    Comment 1 Notify RN      Comment 2 Document in Chart      No results found.       Blood pressure 103/61, pulse 75, temperature 98.2 F (36.8 C), resp. rate 18, SpO2 100 %.   Medical Problem List and Plan: 1. Functional deficits secondary to debility from septic shock.             -patient may shower             -ELOS/Goals: S 10-14 days             Admit to CIR 2.  Antithrombotics: -DVT/anticoagulation:  Pharmaceutical: Other (comment) Eliquis             -antiplatelet therapy: N/A 3. Pain Management: Ultram or tylenol prn.  4. Mood: LCSW to follow for evaluation and support.              -antipsychotic agents: N/A 5. Neuropsych: This patient is not fully capable of making decisions on his own behalf. 6. Skin/Wound Care:  Air mattress overlay for DTI             --will add juven/vitamins to promote wound healing.  7. Fluids/Electrolytes/Nutrition: Strict I/O. Juven and Ensure max             --Recheck CMET in am 8. Chronic systolic/diastolic CHF: Monitor for signs of overload. Check daily weights.              --Back on Demadex, Entresto, Coreg and Pravacol. Resume Jardiance.              --add Low salt/HH restrictions.  9. SSS/A fib s/p PPM/ICD: Monitor HR TID. Monitor for symptoms with increase in activity.  --HR controlled on Eliquis, coreg and amiodarone.   10. CKD: Baseline SCr-1.5 and renal status trending back to baseline with resumption of diuretics.              --  continue to monitor with serial checks.  11. Pre-diabetes/Hyperglycemia: Hgb A1C- 5.7. Jardiance on hold due to infection.  --Elevated BS likely due to steroids/illness.  --Will change Ensure to Ensure Max/Add CM restrictions             --Resume Jardiance in am and wean off insulin glargine.  12. COPD: Respiratory status stable--to  resume Dulera.  --Wean oxgyen as tolerated. Has been oxygen dependent at nights.              --encourage pulmonary hygiene.  13. BPH/Nephrolithiasis: Urinary retention at admission. Continue Flomax and Proscar.              --follow up with Dr. Thalia Bloodgood after discharge.  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  Morbid Obesity: Educate on wt loss/appropriate diet to promote overall health and mobility.  16. Screening for vitamin D deficiency: check vitamin D level tomorrow.  17. History of hypermagnesemia: check magnesium level tomorrow     I have personally performed a face to face diagnostic evaluation, including, but not limited to relevant history and physical exam findings, of this patient and developed relevant assessment and plan.  Additionally, I have reviewed and concur with the physician assistant's documentation above.   Bary Leriche, PA-C    Izora Ribas, MD 09/19/2021

## 2021-10-01 NOTE — Progress Notes (Signed)
Pt arrived from [facility] around 1340. Appears alert and denies pain. 76 yo [phone].

## 2021-10-02 DIAGNOSIS — I25810 Atherosclerosis of coronary artery bypass graft(s) without angina pectoris: Secondary | ICD-10-CM

## 2021-10-02 DIAGNOSIS — R7303 Prediabetes: Secondary | ICD-10-CM

## 2021-10-02 DIAGNOSIS — N189 Chronic kidney disease, unspecified: Secondary | ICD-10-CM

## 2021-10-02 LAB — COMPREHENSIVE METABOLIC PANEL
ALT: 34 U/L (ref 0–44)
AST: 21 U/L (ref 15–41)
Albumin: 2.9 g/dL — ABNORMAL LOW (ref 3.5–5.0)
Alkaline Phosphatase: 74 U/L (ref 38–126)
Anion gap: 4 — ABNORMAL LOW (ref 5–15)
BUN: 14 mg/dL (ref 8–23)
CO2: 32 mmol/L (ref 22–32)
Calcium: 9.4 mg/dL (ref 8.9–10.3)
Chloride: 105 mmol/L (ref 98–111)
Creatinine, Ser: 1.25 mg/dL — ABNORMAL HIGH (ref 0.61–1.24)
GFR, Estimated: 59 mL/min — ABNORMAL LOW (ref >60)
Glucose, Bld: 86 mg/dL (ref 70–99)
Potassium: 4.2 mmol/L (ref 3.5–5.1)
Sodium: 141 mmol/L (ref 135–145)
Total Bilirubin: 0.5 mg/dL (ref 0.3–1.2)
Total Protein: 6.6 g/dL (ref 6.5–8.1)

## 2021-10-02 LAB — CBC WITH DIFFERENTIAL/PLATELET
Abs Immature Granulocytes: 0.1 10*3/uL — ABNORMAL HIGH (ref 0.00–0.07)
Basophils Absolute: 0 10*3/uL (ref 0.0–0.1)
Basophils Relative: 0 %
Eosinophils Absolute: 0.1 10*3/uL (ref 0.0–0.5)
Eosinophils Relative: 1 %
HCT: 29.6 % — ABNORMAL LOW (ref 39.0–52.0)
Hemoglobin: 9.8 g/dL — ABNORMAL LOW (ref 13.0–17.0)
Immature Granulocytes: 2 %
Lymphocytes Relative: 23 %
Lymphs Abs: 1.1 10*3/uL (ref 0.7–4.0)
MCH: 31.7 pg (ref 26.0–34.0)
MCHC: 33.1 g/dL (ref 30.0–36.0)
MCV: 95.8 fL (ref 80.0–100.0)
Monocytes Absolute: 0.5 10*3/uL (ref 0.1–1.0)
Monocytes Relative: 12 %
Neutro Abs: 2.9 10*3/uL (ref 1.7–7.7)
Neutrophils Relative %: 62 %
Platelets: 107 10*3/uL — ABNORMAL LOW (ref 150–400)
RBC: 3.09 MIL/uL — ABNORMAL LOW (ref 4.22–5.81)
RDW: 14.3 % (ref 11.5–15.5)
WBC: 4.7 10*3/uL (ref 4.0–10.5)
nRBC: 0 % (ref 0.0–0.2)

## 2021-10-02 LAB — PROTIME-INR
INR: 1.3 — ABNORMAL HIGH (ref 0.8–1.2)
Prothrombin Time: 16.5 seconds — ABNORMAL HIGH (ref 11.4–15.2)

## 2021-10-02 LAB — GLUCOSE, CAPILLARY
Glucose-Capillary: 112 mg/dL — ABNORMAL HIGH (ref 70–99)
Glucose-Capillary: 107 mg/dL — ABNORMAL HIGH (ref 70–99)
Glucose-Capillary: 123 mg/dL — ABNORMAL HIGH (ref 70–99)
Glucose-Capillary: 110 mg/dL — ABNORMAL HIGH (ref 70–99)

## 2021-10-02 MED ORDER — WARFARIN SODIUM 5 MG PO TABS
7.5000 mg | ORAL_TABLET | Freq: Every day | ORAL | Status: DC
  Administered 2021-10-02 – 2021-10-04 (×3): 7.5 mg via ORAL
  Filled 2021-10-02 (×3): qty 1

## 2021-10-02 MED ORDER — INSULIN GLARGINE-YFGN 100 UNIT/ML ~~LOC~~ SOLN
5.0000 [IU] | Freq: Every day | SUBCUTANEOUS | Status: DC
Start: 2021-10-03 — End: 2021-10-03
  Administered 2021-10-03: 5 [IU] via SUBCUTANEOUS
  Filled 2021-10-02 (×2): qty 0.05

## 2021-10-02 MED ORDER — SORBITOL 70 % SOLN: 30.0000 mL | Freq: Every day | Status: DC | PRN

## 2021-10-02 MED ORDER — CARVEDILOL 6.25 MG PO TABS
6.2500 mg | ORAL_TABLET | Freq: Two times a day (BID) | ORAL | Status: DC
  Administered 2021-10-02 – 2021-10-10 (×16): 6.25 mg via ORAL
  Filled 2021-10-02 (×16): qty 1

## 2021-10-02 NOTE — Progress Notes (Signed)
[facility] Individual Statement of Services  Patient Name:  Ricky Hayden  Date:  10/02/2021  Welcome to the [facility].  Our goal is to provide you with an individualized program based on your diagnosis and situation, designed to meet your specific needs.  With this comprehensive rehabilitation program, you will be expected to participate in at least 3 hours of rehabilitation therapies Monday-Friday, with modified therapy programming on the weekends.  Your rehabilitation program will include the following services:  Physical Therapy (PT), Occupational Therapy (OT), Speech Therapy (ST), 24 hour per day rehabilitation nursing, Therapeutic Recreaction (TR), Neuropsychology, Care Coordinator, Rehabilitation Medicine, Nutrition Services, and Pharmacy Services  Weekly team conferences will be held on Wednesday to discuss your progress.  Your Inpatient Rehabilitation Care Coordinator will talk with you frequently to get your input and to update you on team discussions.  Team conferences with you and your family in attendance may also be held.  Expected length of stay: 12-14 days  Overall anticipated outcome: supervision with PT and min assist with OT  Depending on your progress and recovery, your program may change. Your Inpatient Rehabilitation Care Coordinator will coordinate services and will keep you informed of any changes. Your Inpatient Rehabilitation Care Coordinator's name and contact numbers are listed  below.  The following services may also be recommended but are not provided by the [facility]:   [facility] will be made to provide these services after discharge if needed.  Arrangements include referral to agencies that provide these services.  Your insurance has been verified to be:  [employer] Your primary doctor is:  [place] in [place]  Pertinent  information will be shared with your doctor and your insurance company.  Inpatient Rehabilitation Care Coordinator:  Ovidio Kin, [place] or Emilia Beck  Information discussed with and copy given to patient by: Elease Hashimoto, 10/02/2021, 8:46 AM

## 2021-10-02 NOTE — Progress Notes (Signed)
[place] for enoxaparin> warfarin Indication: atrial fibrillation  Allergies  Allergen Reactions   Calcium Channel Blockers Other (See Comments)    Came to hospital in 1995-caused chest pain     Patient Measurements: Weight: 135 kg (297 lb 9.9 oz)  Vital Signs: Temp: 98.3 F (36.8 C) (06/01 0522) Temp Source: Oral (06/01 0522) BP: 124/76 (06/01 0522) Pulse Rate: 60 (06/01 0522)  Labs: Recent Labs    09/30/21 0706 09/30/21 1631 10/01/21 0712 10/01/21 1709 10/02/21 0535  HGB 10.0*  --  10.2*  --  9.8*  HCT 31.1*  --  32.5*  --  29.6*  PLT 115*  --  113*  --  107*  LABPROT  --  14.2  --   --  16.5*  INR  --  1.1  --   --  1.3*  CREATININE 1.14  --  1.18 1.14 1.25*     Estimated Creatinine Clearance: 65.6 mL/min (A) (by C-G formula based on SCr of 1.25 mg/dL (H)).  Assessment: 76 year old male with new PAF. Patient recently had TEE/DCCV on 5/26. Patient started on enoxaparin and warfarin bridge last night 5/30.   Per [facility] comprehensive anticoagulation / drug interaction document   [email or web address].no  Phenobarbital also interacts with endoxaban, ruling out using any DOAC for anticoagulation. Discussed situation with cardiology this am 5/31.   Recommend continuing enoxaparin to warfarin bridge to therapeutic INR of 2-3 after recent DCCV.   INR slowly trending up  Goal of Therapy:  INR 2-3 Heparin level 0.6-1.0 units/ml Monitor platelets by anticoagulation protocol: Yes   Plan:  Lovenox 130mg  q12 hours Warfarin 7.5mg  tonight Daily INR and CBC every 72 hours while on warfarin  Thank you Anette Guarneri, PharmD 10/02/2021 9:13 AM

## 2021-10-02 NOTE — Evaluation (Signed)
Occupational Therapy Assessment and Plan  Patient Details  Name: Ricky Hayden MRN: [id number] Date of Birth: October 18, 1945  OT Diagnosis: muscle weakness (generalized) Rehab Potential: Rehab Potential (ACUTE ONLY): Good ELOS: 12-14 days   Today's Date: 10/02/2021 OT Individual Time: 1000-1100 OT Individual Time Calculation (min): 60 min     Hospital Problem: Principal Problem:   Debility   Past Medical History:  Past Medical History:  Diagnosis Date   Arthritis    osteoarthritis of left knee   Ascending aorta dilatation (HCC)    Balanitis    recurrent   Cardiac arrhythmia    life threatening, secondary to CCB vs b- blockers   Cardiomyopathy ([place])    Chronic joint pain    Chronic systolic CHF (congestive heart failure) (HCC)    CKD (chronic kidney disease), stage III (HCC)    COPD (chronic obstructive pulmonary disease) ([place])    Coronary artery disease    Dilated aortic root (HCC)    Enlarged prostate    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 Sherwood Gambler   Nocturia    Obesity    Pansinusitis    a.  complicated by brain abscess and bleeding requiring craniotomy in 1995.   PONV (postoperative nausea and vomiting)    Sinusitis    s/p ethmoidectomy and nasal septoplasty   Vertigo    intermitantly   Past Surgical History:  Past Surgical History:  Procedure Laterality Date   BIV UPGRADE N/A 01/29/2020   Procedure: BIV UPGRADE;  Surgeon: Evans Lance, MD;  Location: [place] [facility];  Service: Cardiovascular;  Laterality: N/A;   CARDIOVERSION N/A 09/26/2021   Procedure: CARDIOVERSION;  Surgeon: [name], MD;  Location: [facility];  Service: Cardiovascular;  Laterality: N/A;   CIRCUMCISION N/A 11/20/2013   Procedure: CIRCUMCISION ADULT;  Surgeon: Claybon Jabs, MD;   Location: [facility];  Service: Urology;  Laterality: N/A;   [place]   hematomy due to sinus infection    PACEMAKER IMPLANT N/A 01/17/2020   Procedure: PACEMAKER IMPLANT;  Surgeon: Deboraha Sprang, MD;  Location: [place] [facility];  Service: Cardiovascular;  Laterality: N/A;   RIGHT/LEFT HEART CATH AND CORONARY ANGIOGRAPHY N/A 09/20/2019   Procedure: RIGHT/LEFT HEART CATH AND CORONARY ANGIOGRAPHY;  Surgeon: Jolaine Artist, MD;  Location: [place] [facility];  Service: Cardiovascular;  Laterality: N/A;   shoulder surg rt   1995   SINUS SURGERY WITH INSTATRAK     ethmoidectomy and nasal septum repair   TEE WITHOUT CARDIOVERSION N/A 09/26/2021   Procedure: TRANSESOPHAGEAL ECHOCARDIOGRAM (TEE);  Surgeon: [name], MD;  Location: [facility];  Service: Cardiovascular;  Laterality: N/A;    Assessment & Plan Clinical Impression: Ricky Hayden is a 76 year old male with history of Seizure d/o, CKD-baseline SCr 1.5, COPD- oxygen HS, CAD/cardiac arrhthymias s/p ICD/PPM, pre-diabetes;  who was originally admitted on 08/29/21 with Sepsis due to proteus bacteremia and urinary retention and started on Cefazolin but developed respiratory distress requiring intubation followed by brief PEA arrest and aspiration PNA with septic shock and ARF due to ATN.  Antibiotics transitioned to Unasyn with end date of 05/10 and treated with stress dose steriods. He was noted to have generalized weakness with delay in processing and poor awareness of  deficits. He was admitted to CIR on 05/19/123 for intensive rehab program and continued to require supplemental oxygen with bouts of lethargy.  Patient currently requires min to MaxA with basic self-care skills secondary to muscle weakness and abnormal tone, decreased coordination, and decreased motor planning.  Prior to hospitalization, patient could complete BADL with modified independent .  Patient will benefit from skilled intervention to decrease level of  assist with basic self-care skills and increase independence with basic self-care skills prior to discharge  home with family support .  Anticipate patient will require 24 hour supervision and follow up home health.  OT - End of Session Activity Tolerance: Tolerates 30+ min activity with multiple rests Endurance Deficit: Yes Endurance Deficit Description: Very fatigued/lethargic post PT eval. OT Assessment Rehab Potential (ACUTE ONLY): Good OT Barriers to Discharge:  (patient needs a shower chair that extends outside of the shower for greater ease and independence using the walker) OT Patient demonstrates impairments in the following area(s): Balance;Safety;Sensory;Endurance;Motor OT Basic ADL's Functional Problem(s): Dressing;Bathing;Toileting OT Advanced ADL's Functional Problem(s): Simple Meal Preparation;Light Housekeeping OT Transfers Functional Problem(s): Toilet;Tub/Shower OT Plan OT Intensity: Minimum of 1-2 x/day, 45 to 90 minutes OT Frequency: 5 out of 7 days OT Duration/Estimated Length of Stay: 12-14 days OT Treatment/Interventions: UE/LE Strength taining/ROM;Therapeutic Exercise;Self Care/advanced ADL retraining;Neuromuscular re-education;UE/LE Coordination activities;Therapeutic Activities;DME/adaptive equipment instruction;Patient/family education;Balance/vestibular training OT Self Feeding Anticipated Outcome(s): set-up OT Basic Self-Care Anticipated Outcome(s): ModI to MinA OT Toileting Anticipated Outcome(s): ModI to MinA OT Bathroom Transfers Anticipated Outcome(s): ModI to MinA OT Recommendation Patient destination: Home Follow Up Recommendations: Home health OT;24 hour supervision/assistance Equipment Recommended: Rolling walker with 5" wheels;3 in 1 bedside comode;Tub/shower bench   OT Evaluation Precautions/Restrictions  Precautions Precautions: Fall (patient was able to maintain good levels throughout treatment and at rest.) Precaution Comments: Watch SpO2 (on  3L O2, does not wear baseline), recent orthostatic hypotension; h/o vertigo with current symptoms (pt currently on 2L of O2) Restrictions Weight Bearing Restrictions: No General Chart Reviewed: Yes Response to Previous Treatment: Patient reporting fatigue but able to participate Family/Caregiver Present: No Vital Signs Therapy Vitals Pulse Rate: 63 BP: 109/64 Patient Position (if appropriate): Sitting Oxygen Therapy SpO2: 97 % O2 Device: Nasal Cannula (2L) Patient Activity (if Appropriate): In bed Pulse Oximetry Type: Intermittent Pain Pain Assessment Pain Score: 4  Pain Type:  (body) Pain Onset: On-going Home Living/Prior Functioning Home Living Family/patient expects to be discharged to:: Private residence Living Arrangements: Children Available Help at Discharge: Family, Available 24 hours/day Type of Home: House Home Access: Stairs to enter [employer] of Steps: 1 step onto porch and then step into house Entrance Stairs-Rails: None Home Layout: One level Bathroom Shower/Tub: Walk-in shower, Tub/shower unit (walk-in shower has a lip of estimated 10 inches) Bathroom Toilet: Handicapped height Bathroom Accessibility: Yes (Using RW not w/c) Additional Comments: children rotate staying overnight  Lives With: Alone, Family, Other (Comment) IADL History Homemaking Responsibilities: No (indicated that he had prepared his meals, but now his kids would be preparing the meal.) Current License: Yes Occupation: Retired Prior Function Level of Independence: Independent with gait, Independent with transfers, Requires assistive device for independence Meal Prep: Maximal (based on current performance in relation to LB) Laundry: Total Vacuuming: Total Light Housekeeping: Maximal Shopping: Total  Able to Take Stairs?: No Driving: No Vision Baseline Vision/History: 1 Wears glasses Perception  Perception: Within Functional Limits Praxis    Cognition Cognition Overall Cognitive Status: Within Functional Limits for tasks assessed Arousal/Alertness: Awake/alert Orientation Level: Person Memory:  Impaired Memory Impairment: Decreased recall of new information Decreased Short Term Memory: Verbal basic;Functional basic Focused Attention: Impaired Focused Attention Impairment: Verbal basic;Functional basic Sustained Attention: Appears intact Sustained Attention Impairment: Verbal basic;Functional basic Awareness: Appears intact Problem Solving: Appears intact Problem Solving Impairment: Verbal basic;Functional basic Safety/Judgment: Impaired Brief Interview for Mental Status (BIMS) Repetition of Three Words (First Attempt): 3 Temporal Orientation: Year: Correct Temporal Orientation: Month: Accurate within 5 days Temporal Orientation: Day: Incorrect Recall: "Sock": Yes, no cue required Recall: "Blue": Yes, no cue required Recall: "Bed": Yes, no cue required BIMS Summary Score: 14 Sensation Sensation Light Touch: Impaired Detail Peripheral sensation comments: sensation associated with bilateral hands digits 4, 5 and associated palmar region Light Touch Impaired Details: Absent RUE;Absent LUE Stereognosis: Appears Intact Coordination Fine Motor Movements are Fluid and Coordinated: No Coordination and Movement Description: generalized deconditioning, significantly limited by lethargy/poor initiation/"all over" pain Motor  Motor Motor: Abnormal postural alignment and control Motor - Skilled Clinical Observations: generalized deconditioning, significantly limited by lethargy/poor initiation/"all over" pain  Trunk/Postural Assessment  Cervical Assessment Cervical Assessment: Exceptions to [facility] Thoracic Assessment Thoracic Assessment: Exceptions to [facility] Thoracic Strength Thoracic Flexion: 3/5 Postural Control Postural Control: Deficits on evaluation  Balance Static Sitting Balance Static Sitting - Balance Support: Feet  supported Static Sitting - Level of Assistance: 5: Stand by assistance;4: Min assist (using the stedy plus 1) Dynamic Sitting Balance Sitting balance - Comments: patient able to come from supine to EOB with SBA and ModA for returning to supine Static Standing Balance Static Standing - Level of Assistance: 3: Mod assist;2: Max assist Dynamic Standing Balance Dynamic Standing - Level of Assistance: 2: Max assist;1: +1 Total assist Extremity/Trunk Assessment RUE Assessment RUE Assessment: Within Functional Limits General Strength Comments: generalized weakness, able to grasp bed rails (3/5MMT) LUE Assessment LUE Assessment: Within Functional Limits General Strength Comments: generalized weakness, able to grasp cup to drink/wash face but did not attempt to incorproate much functionally (3/5MMT)  Care Tool Care Tool Self Care Eating   Eating Assist Level: Set up assist (based of observation of performance)    Oral Care    Oral Care Assist Level: Set up assist    Bathing   Body parts bathed by patient: Right arm;Left arm;Chest;Abdomen;Front perineal area;Right upper leg;Left upper leg;Right lower leg;Face Body parts bathed by helper: Buttocks   Assist Level: Minimal Assistance - Patient > 75% (MinA for UB and Mod/MaxA for LB)    Upper Body Dressing(including orthotics)   What is the patient wearing?: Pull over shirt [facility] top and pants)   Assist Level: Set up assist    Lower Body Dressing (excluding footwear)   What is the patient wearing?: Incontinence brief;Pants (MaxA at time of eval) Assist for lower body dressing: Maximal Assistance - Patient 25 - 49%    Putting on/Taking off footwear     Assist for footwear: Total Assistance - Patient < 25%       Care Tool Toileting Toileting activity   Assist for toileting: Maximal Assistance - Patient 25 - 49%     Care Tool Bed Mobility Roll left and right activity   Roll left and right assist level: Minimal Assistance -  Patient > 75% (pt presents as Min/ ModA)    Sit to lying activity   Sit to lying assist level: Minimal Assistance - Patient > 75%    Lying to sitting on side of bed activity Lying to sitting on side of bed activity did not occur: the ability to move  from lying on the back to sitting on the side of the bed with no back support.: Safety/medical concerns Lying to sitting on side of bed assist level: the ability to move from lying on the back to sitting on the side of the bed with no back support.: Minimal Assistance - Patient > 75%     Care Tool Transfers Sit to stand transfer Sit to stand activity did not occur: Safety/medical concerns (pt able to perform task using the [facility]  and additional time.)      Chair/bed transfer   Chair/bed transfer assist level: Dependent - mechanical lift (dependent using the stedy plus 1)     Toilet transfer Toilet transfer activity did not occur:  (Based on observtion dependent) Assist Level: Dependent - Patient 0%     Care Tool Cognition  Expression of Ideas and Wants Expression of Ideas and Wants: 4. Without difficulty (complex and basic) - expresses complex messages without difficulty and with speech that is clear and easy to understand  Understanding Verbal and Non-Verbal Content Understanding Verbal and Non-Verbal Content: 3. Usually understands - understands most conversations, but misses some part/intent of message. Requires cues at times to understand   Memory/Recall Ability Memory/Recall Ability : Current season;Location of own room;Staff names and faces;That he or she is in a hospital/hospital unit   Refer to Care Plan for [place] 1 OT Short Term Goal 1 (Week 1): Pt will complete LB ADL at EOB  with min A for balance using AE OT Short Term Goal 2 (Week 1): Pt will complete stand pivot transfer from EOB to [facility] with MinA  at 90%safe OT Short Term Goal 3 (Week 1): Pt will complete grooming activity at sink at w/c LOF  with s/u only at 90% safe OT Short Term Goal 4 (Week 1): Pt will don pants with ModA using AE for addiitonal reach at chair LOF and 90%safe OT Short Term Goal 5 (Week 1): The pt will tolerate greater than 25 mins of activity at 90% safe with 2-3 rest breaks.  Recommendations for other services: None    Skilled Therapeutic Intervention Ricky Hayden is a 76 year old male with history of Seizure d/o, CKD-baseline SCr 1.5, COPD- oxygen HS, CAD/cardiac arrhthymias s/p ICD/PPM, pre-diabetes;  who was originally admitted on 08/29/21 with Sepsis due to proteus bacteremia and urinary retention and started on Cefazolin but developed respiratory distress requiring intubation followed by brief PEA arrest and aspiration PNA with septic shock and ARF due to ATN.  Antibiotics transitioned to Unasyn with end date of 05/10 and treated with stress dose steriods. He was noted to have generalized weakness with delay in processing and poor awareness of deficits. He was admitted to CIR on 05/19/123 for intensive rehab program and continued to require supplemental oxygen with bouts of lethargy.The pt was seen this AM he was able to come from supine to EOB with SBA to CGA, he require the stedy plus 1 for functional transfers, coming from sit to stand.  The pt required s/u to [facility] for BADL related task in bathing and dressing UB, he was ModA to [place] with LB function.  The pt presents with decrease endurance and challenges with how he approaches his environment impacting his safety and independence, he would benefit for skilled OT to improve his functional outcome in hopes of reducing the burden of care for others. In my opinion ,the pt would benefit from OT for a safe transition to home  with increase independence.   ADL ADL Eating: Set up (based on functional performance during observation.) Where Assessed-Eating: Bed level Grooming: Minimal cueing;Setup Where Assessed-Grooming: Edge of bed Upper Body Bathing: Minimal  assistance Where Assessed-Upper Body Bathing: Edge of bed Lower Body Bathing: Maximal assistance Where Assessed-Lower Body Bathing: Bed level;Edge of bed (incorporating the stedy for sit to stand) Upper Body Dressing: Setup Where Assessed-Upper Body Dressing: Edge of bed Lower Body Dressing: Maximal assistance Where Assessed-Lower Body Dressing: Bed level (brief and pants) Toileting: Maximal assistance Where Assessed-Toileting: Bed level Toilet Transfer: Dependent Toilet Transfer Method:  (stedy) Tub/Shower Transfer: Maximal assistance (based on observation) Tub/Shower Transfer Method:  (based on observation) Social research officer, government: Not assessed Mobility  Bed Mobility Bed Mobility: Supine to Sit;Sit to Supine Supine to Sit: Moderate Assistance - Patient 50-74% Sit to Supine: Moderate Assistance - Patient 50-74% Transfers Sit to Stand: Dependent - mechanical lift Stand to Sit: Maximal Assistance - Patient 25-49%;Total Assistance - Patient < 25%   Discharge Criteria: Patient will be discharged from OT if patient refuses treatment 3 consecutive times without medical reason, if treatment goals not met, if there is a change in medical status, if patient makes no progress towards goals or if patient is discharged from hospital.  The above assessment, treatment plan, treatment alternatives and goals were discussed and mutually agreed upon: by patient  Yvonne Kendall 10/02/2021, 1:12 PM

## 2021-10-02 NOTE — Progress Notes (Signed)
Inpatient Rehabilitation  Patient information reviewed and entered into eRehab system by Kylee Nardozzi Trellis Guirguis, OTR/L.   Information including medical coding, functional ability and quality indicators will be reviewed and updated through discharge.    

## 2021-10-02 NOTE — H&P (Signed)
Physical Medicine and Rehabilitation Admission H&P        Chief Complaint  Patient presents with   Debility due to multiple medical issues.       HPI: Ricky Hayden is a 76 year old male with history of CKD- baseline SCr- 1.5, COPD, CAD/cardiac arrhthymias s/p ICD/PPM, A fib- on eliquis, seizure disorder (on phenobarbital), recent UTI  who was admitted on 08/29/21 with fever T-103, hematuria and chills due urospesis. He was started on IV antibiotics. CT abdomen showed distended bladder with non-obstructing renal stones and subcentimeter exophytic lesion on left kidney. Dr. Cain Sieve recommended bladder scan to monitor voiding, foley for retention and cysto on outpatient basis. BC positive for Proteus with ongoing fevers and ceftriaxone broadened to Cefazolin X 14 days per Dr. Linus Salmons.   On 05/02, he had decrease in LOC with increase in oxygen requirement and required intubation as well as brief PEA arrest after intubation. CXR showed right infiltrate concerning for aspiration event with HCAP.    Hypotension due to septic shock treated with fluid bolus and pressors. He had decrease in UOP as well as concerns of overload treated with and had worsening of renal status. Acute on chronic renal failure felt to be due to ATN secondary to  severe UTI/sepsis/shock per Dr. Posey Pronto.  EEG was negative for seizure. CT head negative for acute changes. Antibiotics transitioned to Ampicillin sulbactam with end date 5/10. He tolerated extubation by 05/12 and placed on D3, thins due to prolonged oral phase/mastication.  AKI resolved with renal function at baseline and nephrology signed off. Stress dose steriods weaned off with improvement in BS. Delirium is resolving but he continues to have delay in processing, decreased awareness of deficits, fatigue, generalized weakness and poor balance with decrease in postural reaction. PT/OT working with patient and CIR recommended due to functional decline. Currently complains of  shortness of breath.    Review of Systems  Constitutional:  Negative for chills and fever.  HENT:  Negative for hearing loss and tinnitus.   Eyes:  Negative for blurred vision.  Respiratory:  Positive for shortness of breath.   Cardiovascular:  Negative for chest pain and leg swelling.  Gastrointestinal:  Negative for heartburn and nausea.  Genitourinary:  Positive for frequency. Negative for dysuria.  Musculoskeletal:  Positive for myalgias (hurts all over).  Neurological:  Positive for weakness. Negative for dizziness and headaches.  Psychiatric/Behavioral:  Positive for memory loss.        Has had issues with disorientation at nights with gait disorder.           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 Sherwood Gambler   Nocturia     Obesity     Pansinusitis      a.  complicated by brain abscess and bleeding requiring craniotomy in 1995.   PONV (postoperative nausea and vomiting)     Sinusitis      s/p ethmoidectomy and nasal septoplasty   Vertigo      intermitantly           Past Surgical History:  Procedure Laterality Date   BIV UPGRADE N/A 01/29/2020    Procedure: BIV UPGRADE;  Surgeon: Evans Lance, MD;  Location: [facility];  Service: Cardiovascular;   Laterality: N/A;   CIRCUMCISION N/A 11/20/2013    Procedure: CIRCUMCISION ADULT;  Surgeon: Claybon Jabs, MD;  Location: [facility];  Service: Urology;  Laterality: N/A;   [place]    hematomy due to sinus infection    PACEMAKER IMPLANT N/A 01/17/2020    Procedure: PACEMAKER IMPLANT;  Surgeon: Deboraha Sprang, MD;  Location: [facility];  Service: Cardiovascular;  Laterality: N/A;   RIGHT/LEFT HEART CATH AND CORONARY ANGIOGRAPHY N/A 09/20/2019    Procedure: RIGHT/LEFT HEART CATH AND CORONARY ANGIOGRAPHY;  Surgeon: Jolaine Artist, MD;  Location: [facility];  Service: Cardiovascular;  Laterality: N/A;   shoulder surg rt    1995   SINUS SURGERY WITH INSTATRAK        ethmoidectomy and nasal septum repair           Family History  Problem Relation Age of Onset   Heart failure Mother 67   Heart attack Father 37      Social History:  Widowed--children have been spending nights for past 2 years for safety. Has four children in town who provide meals/home management.  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 He  reports that he quit smoking about 35 years ago. He has never used smokeless tobacco. He reports that he does not drink alcohol and does not use drugs.          Allergies  Allergen Reactions   Calcium Channel Blockers Other (See Comments)      Came to hospital in 1995-caused chest pain              Medications Prior to Admission  Medication Sig Dispense Refill   acetaminophen (TYLENOL) 500 MG tablet Take 1,000 mg by mouth every 6 (six) hours as needed for moderate pain or headache.       albuterol (PROVENTIL HFA;VENTOLIN HFA) 108 (90 BASE) MCG/ACT inhaler Inhale 2 puffs into the lungs every 6 (six) hours as needed for wheezing. 1 Inhaler 11   amiodarone (PACERONE) 200 MG tablet Take 1 tablet (200 mg total) by mouth daily. 90 tablet 3   apixaban (ELIQUIS) 5 MG TABS tablet Take 1 tablet by mouth in  the morning and at bedtime.       budesonide-formoterol (SYMBICORT) 80-4.5 MCG/ACT inhaler Inhale 2 puffs into the lungs 2 (two) times daily. 1 Inhaler 12   carvedilol (COREG) 6.25 MG tablet Take 1 tablet (6.25 mg total) by mouth 2 (two) times daily with a meal. 180 tablet 3   cetirizine (ZYRTEC) 10 MG tablet Take 1 tablet by mouth daily.       diclofenac Sodium (VOLTAREN) 1 % GEL Apply 2 g topically daily as needed (pain).        docusate  sodium (COLACE) 100 MG capsule Take 1-2 capsules (100-200 mg total) by mouth 2 (two) times daily as needed for mild constipation. 180 capsule 3   empagliflozin (JARDIANCE) 10 MG TABS tablet Take 1 tablet (10 mg total) by mouth daily before breakfast. 90 tablet 3   feeding supplement (ENSURE ENLIVE / ENSURE PLUS) LIQD Take 237 mLs by mouth 2 (two) times daily between meals. 237 mL 12   finasteride (PROSCAR) 5 MG tablet TAKE ONE TABLET BY MOUTH ONCE DAILY (Patient taking differently: Take 5 mg by mouth daily.) 30 tablet 11   GINKGO BILOBA PO Take 2 tablets by mouth at bedtime.       [START ON 09/20/2021] insulin glargine-yfgn (SEMGLEE) 100 UNIT/ML injection Inject 0.08 mLs (8 Units total) into the skin daily. 10 mL 11   Multiple Vitamins-Minerals (ICAPS AREDS 2 PO) Take 2 tablets by mouth daily.       omeprazole (PRILOSEC) 20 MG capsule Take 1 capsule (20 mg total) by mouth daily. 30 capsule 11   PHENobarbital (LUMINAL) 64.8 MG tablet Take 1 tablet (64.8 mg total) by mouth 2 (two) times daily. 180 tablet 3   polyethylene glycol (MIRALAX / GLYCOLAX) packet Take 17 g by mouth daily as needed for moderate constipation. 14 each 0   pravastatin (PRAVACHOL) 40 MG tablet Take 1 tablet (40 mg total) by mouth every evening. 90 tablet 3   sacubitril-valsartan (ENTRESTO) 24-26 MG Take 1 tablet by mouth 2 (two) times daily. 180 tablet 3   SPIRIVA HANDIHALER 18 MCG inhalation capsule INHALE ONE DOSE BY MOUTH ONCE DAILY (Patient not taking: Reported on 08/29/2021) 30 capsule 11    tamsulosin (FLOMAX) 0.4 MG CAPS capsule Take 1 capsule (0.4 mg total) by mouth daily. 90 capsule 3   torsemide (DEMADEX) 20 MG tablet Take 1 tablet (20 mg total) by mouth 2 (two) times daily.       traMADol (ULTRAM) 50 MG tablet Take 1 tablet (50 mg total) by mouth every 6 (six) hours as needed for moderate pain. 30 tablet      Home: Home Living Family/patient expects to be discharged to:: Private residence Living Arrangements: Alone (adult children stay nights and weekends) Available Help at Discharge: Family, Available 24 hours/day (daugher available 24/7) Type of Home: House Home Access: Stairs to enter [employer] of Steps: 3 Entrance Stairs-Rails: None Home Layout: One level Bathroom Shower/Tub: Gaffer, Chiropodist: Handicapped height Bathroom Accessibility: Yes Home Equipment: Radiation protection practitioner (4 wheels), Hospital bed, Shower seat  Lives With: Daughter, Son   Functional History: Prior Function Prior Level of Function : Needs assist Mobility Comments: uses rollator for ambulation in community if longer distance; walks without device in home and short community distances, children mostly manage IADLs ADLs Comments: Needs help sometimes per report. Drives sometimes.   Functional Status:  Mobility: Bed Mobility Overal bed mobility: Needs Assistance Bed Mobility: Supine to Sit Rolling: Max assist Sidelying to sit: +2 for physical assistance, Max assist Supine to sit: Mod assist, +2 for physical assistance Sit to supine: +2 for physical assistance, Total assist General bed mobility comments: received sitting in recliner (transferred via maximove with nursing) Transfers Overall transfer level: Needs assistance Equipment used: Rolling walker (2 wheels) Transfers: Sit to/from Stand Sit to Stand: Max assist, +2 safety/equipment Bed to/from chair/wheelchair/BSC transfer type:: Stand pivot Stand pivot transfers: Max assist, +2 physical  assistance Transfer via Lift Equipment: Stedy General transfer comment: Pt unable to advance his LEs during stand pivot transfer without an  assistive device. Therapist had to assist with moving his foot with each small step. Ambulation/Gait Ambulation/Gait assistance: +2 safety/equipment, Mod assist, Max assist Gait Distance (Feet): 2 Feet Assistive device: Rolling walker (2 wheels) Gait Pattern/deviations: Step-to pattern, Decreased weight shift to left, Shuffle, Antalgic, Trunk flexed General Gait Details: pt able to take 4-5 shufflings steps forward with RW and mod-maxA to maintain upright posture; pt inconsistent with ability to weight shift and maintain trunk extension; difficulty accepting weight onto LLE in order to take complete step with R foot; increasing R lateral lean with fatigue; +2 assist for chair follow Gait velocity: decreased Pre-gait activities: weight shifts in stedy frame   ADL: ADL Overall ADL's : Needs assistance/impaired Eating/Feeding: Moderate assistance, Bed level Eating/Feeding Details (indicate cue type and reason): May benefit from adaptive utensils Grooming: Wash/dry face, Min guard, Dealer Details (indicate cue type and reason): sitting EOB unsupported Upper Body Bathing: Minimal assistance, Sitting Upper Body Bathing Details (indicate cue type and reason): sitting unsupported EOB Lower Body Bathing: +2 for physical assistance, Maximal assistance Upper Body Dressing : Sitting, Moderate assistance Upper Body Dressing Details (indicate cue type and reason): for donning hospital gown only Lower Body Dressing: Total assistance Lower Body Dressing Details (indicate cue type and reason): total assist for donning gripper socks Toilet Transfer: Maximal assistance, +2 for physical assistance, Stand-pivot Toilet Transfer Details (indicate cue type and reason): simulated Toileting- Clothing Manipulation and Hygiene: +2 for safety/equipment, Maximal  assistance Toileting - Clothing Manipulation Details (indicate cue type and reason): Currently with flexiseal. General ADL Comments: Pt able to sit EOB and work on bathing and dressing sit to stand.  Increased right lean/pushing noted with posterior pelvic tilt.  Pt resistant to therapist trying to help get him back to midline by pushing with the LUE on the bed.  Eventually, this improved slightly.  Oxygen sats 92% or greater on room air throughout activity.   Cognition: Cognition Overall Cognitive Status: Impaired/Different from baseline Orientation Level: Oriented to person, Oriented to place, Disoriented to time, Disoriented to situation Cognition Arousal/Alertness: Awake/alert Behavior During Therapy: Flat affect Overall Cognitive Status: Impaired/Different from baseline Area of Impairment: Attention, Problem solving, Awareness, Safety/judgement Orientation Level: Disoriented to, Time Current Attention Level: Sustained Memory: Decreased short-term memory Following Commands: Follows one step commands with increased time Safety/Judgement: Decreased awareness of deficits, Decreased awareness of safety Awareness: Intellectual Problem Solving: Slow processing, Difficulty sequencing, Requires verbal cues, Requires tactile cues, Decreased initiation General Comments: Pt needing increased time to process what he wanted to order for lunch, even when given choices.  He still was unable to choose within an appropriate period of time exhibiting greater than 20-30 second delay. Difficult to assess due to: Level of arousal   Blood pressure 103/61, pulse 75, temperature 98.2 F (36.8 C), resp. rate 18, SpO2 100 %. Physical Exam Vitals and nursing note reviewed. Initially sleeping in chair bent forward HENT:     Head:     Comments: Well healed old crani incision.  Pulmonary:     Comments: Decreased breath sounds.  Abdominal:     General: There is distension.     Tenderness: There is no  abdominal tenderness.  Neurological:     Mental Status: He is easily aroused. He is lethargic and disoriented.     Comments: Oriented to self, place, age but thought it was October and persevered on 75 as year of birth. Limited by lethargy but able to follow simple motor commands.    MSK: protracted  posture. No focal deficits.         Results for orders placed or performed during the hospital encounter of 08/28/21 (from the past 48 hour(s))  Glucose, capillary     Status: Abnormal    Collection Time: 09/17/21  4:36 PM  Result Value Ref Range    Glucose-Capillary 133 (H) 70 - 99 mg/dL      Comment: Glucose reference range applies only to samples taken after fasting for at least 8 hours.  Glucose, capillary     Status: Abnormal    Collection Time: 09/17/21  9:09 PM  Result Value Ref Range    Glucose-Capillary 118 (H) 70 - 99 mg/dL      Comment: Glucose reference range applies only to samples taken after fasting for at least 8 hours.    Comment 1 Notify RN      Comment 2 Document in Chart    CBC     Status: Abnormal    Collection Time: 09/18/21 12:46 AM  Result Value Ref Range    WBC 7.9 4.0 - 10.5 K/uL    RBC 3.35 (L) 4.22 - 5.81 MIL/uL    Hemoglobin 10.4 (L) 13.0 - 17.0 g/dL    HCT 31.9 (L) 39.0 - 52.0 %    MCV 95.2 80.0 - 100.0 fL    MCH 31.0 26.0 - 34.0 pg    MCHC 32.6 30.0 - 36.0 g/dL    RDW 13.2 11.5 - 15.5 %    Platelets 227 150 - 400 K/uL    nRBC 0.0 0.0 - 0.2 %      Comment: Performed at [facility], [place] [address], [place], [place] [address]  Basic metabolic panel     Status: Abnormal    Collection Time: 09/18/21 12:46 AM  Result Value Ref Range    Sodium 141 135 - 145 mmol/L    Potassium 4.0 3.5 - 5.1 mmol/L    Chloride 107 98 - 111 mmol/L    CO2 29 22 - 32 mmol/L    Glucose, Bld 132 (H) 70 - 99 mg/dL      Comment: Glucose reference range applies only to samples taken after fasting for at least 8 hours.    BUN 51 (H) 8 - 23 mg/dL    Creatinine, Ser 1.39 (H)  0.61 - 1.24 mg/dL    Calcium 8.8 (L) 8.9 - 10.3 mg/dL    GFR, Estimated 52 (L) >60 mL/min      Comment: (NOTE) Calculated using the CKD-EPI Creatinine Equation (2021)      Anion gap 5 5 - 15      Comment: Performed at [facility] [address] [place], [place] [address]  Glucose, capillary     Status: Abnormal    Collection Time: 09/18/21  6:14 AM  Result Value Ref Range    Glucose-Capillary 107 (H) 70 - 99 mg/dL      Comment: Glucose reference range applies only to samples taken after fasting for at least 8 hours.    Comment 1 Notify RN      Comment 2 Document in Chart    Glucose, capillary     Status: Abnormal    Collection Time: 09/18/21 11:19 AM  Result Value Ref Range    Glucose-Capillary 138 (H) 70 - 99 mg/dL      Comment: Glucose reference range applies only to samples taken after fasting for at least 8 hours.  Glucose, capillary     Status: Abnormal  Collection Time: 09/18/21  4:37 PM  Result Value Ref Range    Glucose-Capillary 117 (H) 70 - 99 mg/dL      Comment: Glucose reference range applies only to samples taken after fasting for at least 8 hours.  Glucose, capillary     Status: Abnormal    Collection Time: 09/18/21  9:19 PM  Result Value Ref Range    Glucose-Capillary 159 (H) 70 - 99 mg/dL      Comment: Glucose reference range applies only to samples taken after fasting for at least 8 hours.    Comment 1 Notify RN      Comment 2 Document in Chart    Glucose, capillary     Status: Abnormal    Collection Time: 09/19/21  6:13 AM  Result Value Ref Range    Glucose-Capillary 121 (H) 70 - 99 mg/dL      Comment: Glucose reference range applies only to samples taken after fasting for at least 8 hours.    Comment 1 Notify RN      Comment 2 Document in Chart    Glucose, capillary     Status: Abnormal    Collection Time: 09/19/21 12:07 PM  Result Value Ref Range    Glucose-Capillary 119 (H) 70 - 99 mg/dL      Comment: Glucose reference range applies only to  samples taken after fasting for at least 8 hours.    Comment 1 Notify RN      Comment 2 Document in Chart      No results found.       Blood pressure 103/61, pulse 75, temperature 98.2 F (36.8 C), resp. rate 18, SpO2 100 %.   Medical Problem List and Plan: 1. Functional deficits secondary to debility from septic shock.             -patient may shower             -ELOS/Goals: S 10-14 days             Admit to CIR 2.  Antithrombotics: -DVT/anticoagulation:  Pharmaceutical: Other (comment) Eliquis             -antiplatelet therapy: N/A 3. Pain Management: Ultram or tylenol prn.  4. Mood: LCSW to follow for evaluation and support.              -antipsychotic agents: N/A 5. Neuropsych: This patient is not fully capable of making decisions on his own behalf. 6. Skin/Wound Care:  Air mattress overlay for DTI             --will add juven/vitamins to promote wound healing.  7. Fluids/Electrolytes/Nutrition: Strict I/O. Juven and Ensure max             --Recheck CMET in am 8. Chronic systolic/diastolic CHF: Monitor for signs of overload. Check daily weights.              --Back on Demadex, Entresto, Coreg and Pravacol. Resume Jardiance.              --add Low salt/HH restrictions.  9. SSS/A fib s/p PPM/ICD: Monitor HR TID. Monitor for symptoms with increase in activity.  --HR controlled on Eliquis, coreg and amiodarone.   10. CKD: Baseline SCr-1.5 and renal status trending back to baseline with resumption of diuretics.              --continue to monitor with serial checks.  11. Pre-diabetes/Hyperglycemia: Hgb A1C- 5.7. Jardiance on hold due to infection.  --  Elevated BS likely due to steroids/illness.  --Will change Ensure to Ensure Max/Add CM restrictions             --Resume Jardiance in am and wean off insulin glargine.  12. COPD: Respiratory status stable--to resume Dulera.  --Wean oxgyen as tolerated. Has been oxygen dependent at nights.              --encourage pulmonary hygiene.   13. BPH/Nephrolithiasis: Urinary retention at admission. Continue Flomax and Proscar.              --follow up with Dr. Thalia Bloodgood after discharge.  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  Morbid Obesity: Educate on wt loss/appropriate diet to promote overall health and mobility.  16. Screening for vitamin D deficiency: check vitamin D level tomorrow.  17. History of hypermagnesemia: check magnesium level tomorrow     I have personally performed a face to face diagnostic evaluation, including, but not limited to relevant history and physical exam findings, of this patient and developed relevant assessment and plan.  Additionally, I have reviewed and concur with the physician assistant's documentation above.   Ivan Anchors Love, PA-C    Izora Ribas, MD 09/19/2021         Deleted by: Elease Hashimoto, LCSW at 09/25/2021  2:43 PM

## 2021-10-02 NOTE — Evaluation (Signed)
Speech Language Pathology Assessment and Plan  Patient Details  Name: Ricky Hayden MRN: [id number] Date of Birth: [birth year]  SLP Diagnosis: Cognitive Impairments;Dysphagia  Rehab Potential: Good ELOS: 10-14 days    Today's Date: 10/02/2021 SLP Individual Time: 0900-1000 SLP Individual Time Calculation (min): 66 min   Hospital Problem: Principal Problem:   Debility  Past Medical History:  Past Medical History:  Diagnosis Date   Arthritis    osteoarthritis of left knee   Ascending aorta dilatation (HCC)    Balanitis    recurrent   Cardiac arrhythmia    life threatening, secondary to CCB vs b- blockers   Cardiomyopathy ([place])    Chronic joint pain    Chronic systolic CHF (congestive heart failure) (HCC)    CKD (chronic kidney disease), stage III (HCC)    COPD (chronic obstructive pulmonary disease) ([place])    Coronary artery disease    Dilated aortic root (HCC)    Enlarged prostate    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 Sherwood Gambler   Nocturia    Obesity    Pansinusitis    a.  complicated by brain abscess and bleeding requiring craniotomy in 1995.   PONV (postoperative nausea and vomiting)    Sinusitis    s/p ethmoidectomy and nasal septoplasty   Vertigo    intermitantly   Past Surgical History:  Past Surgical History:  Procedure Laterality Date   BIV UPGRADE N/A 01/29/2020   Procedure: BIV UPGRADE;  Surgeon: Evans Lance, MD;  Location: [place] [facility];  Service: Cardiovascular;  Laterality: N/A;   CARDIOVERSION N/A 09/26/2021   Procedure: CARDIOVERSION;  Surgeon: [name], MD;  Location: [place];  Service: Cardiovascular;  Laterality: N/A;   CIRCUMCISION N/A 11/20/2013   Procedure: CIRCUMCISION ADULT;  Surgeon: Claybon Jabs, MD;  Location: [facility];   Service: Urology;  Laterality: N/A;   [place]   hematomy due to sinus infection    PACEMAKER IMPLANT N/A 01/17/2020   Procedure: PACEMAKER IMPLANT;  Surgeon: Deboraha Sprang, MD;  Location: [place] [facility];  Service: Cardiovascular;  Laterality: N/A;   RIGHT/LEFT HEART CATH AND CORONARY ANGIOGRAPHY N/A 09/20/2019   Procedure: RIGHT/LEFT HEART CATH AND CORONARY ANGIOGRAPHY;  Surgeon: Jolaine Artist, MD;  Location: [place] [facility];  Service: Cardiovascular;  Laterality: N/A;   shoulder surg rt   1995   SINUS SURGERY WITH INSTATRAK     ethmoidectomy and nasal septum repair   TEE WITHOUT CARDIOVERSION N/A 09/26/2021   Procedure: TRANSESOPHAGEAL ECHOCARDIOGRAM (TEE);  Surgeon: [name], MD;  Location: [facility];  Service: Cardiovascular;  Laterality: N/A;    Assessment / Plan / Recommendation Clinical Impression   Pt is a 76 y/o male with PMH of combined systolic/diastolic CHF with ICD, atrial fibrillation, CKD III, COPD, and seizures, who presented to [facility] on 4/27 with worsening chills, fevers, and SOB.  He initially required bipap, tmax 103 in ED with UA showing features concerning for UTI so started on empiric antibiotics.  Cultures grew Proteus, ID consulted.  Pt developed septic shock with worsening respiratory status and fluid overload, transferred to ICU on 5/2 for intubation and had brief PEA arrest with quick ROSC.  Pt was extubated on 5/12.  Antibiotics completed.  Hospital  course further complicated by AKI.  Pt was admitted to CIR on 5/19.  While on rehab pt demonstrated bouts of lethargy and delirium.  Maintained on dysphagia diet.  On 5/24 he developed hypotension with SBP in the 70s, as well as wide-complex tachycardia with HR of 130.  Dr. Marlou Porch was consulted and recommended loading dose of amio and fluid boluses.  He was transferred back to acute services on 5/25 for DCCV on 5/26.  Therapy ongoing and pt was recommended to return to CIR to complete program prior  to d/c home.   Pt presents with mild-moderate cognitive impairments, deficits include short term recall, working memory, mildly complex problem solving, emergent awareness and higher level attention. Pt scored 24/30 (n=>27) on SLUMS and moderate deficits on Cognistat: Construction and Calculation tasks. Pt supports family completing IADLs after battle with COVID, but interest in regaining independence with supervision. Pt's oral motor skills appear WFL. Pt consumed regular textures and thin liquids via straw with appropriate mastication, oral clearance, and timely swallow with occasional delayed throat clear only with solids. SLP recommends regular textures and thin liquid diet with intermittent supervision A to ensure tolerance and use of swallow strategies. Pt would benefit from skilled ST services in order to maximize functional independence and reduce burden of care, likely requiring supervision at discharge with continued skilled ST services.    Skilled Therapeutic Interventions          Skilled ST services focused on cognitive skills. SLP facilitated administration of cognitive linguistic formal assessment and provided education of results. SLP and pt collaborated to set goals for cognitive linguistic needs during length of stay. All questions answered to satisfaction.  Pt was left in room with call bell within reach and bed alarm set. SLP recommends to continue skilled services.    SLP Assessment  Patient will need skilled Speech Lanaguage Pathology Services during CIR admission    Recommendations  SLP Diet Recommendations: Age appropriate regular solids;Thin Liquid Administration via: Cup;Straw Medication Administration: Whole meds with liquid Supervision: Patient able to self feed;Intermittent supervision to cue for compensatory strategies Compensations: Slow rate;Small sips/bites;Clear throat intermittently Postural Changes and/or Swallow Maneuvers: Seated upright 90 degrees Oral Care  Recommendations: Oral care BID Patient destination: Home Follow up Recommendations: Home Health SLP;Outpatient SLP Equipment Recommended: None recommended by SLP    SLP Frequency 3 to 5 out of 7 days   SLP Duration  SLP Intensity  SLP Treatment/Interventions 10-14 days  Minumum of 1-2 x/day, 30 to 90 minutes  Cognitive remediation/compensation;Environmental controls;Internal/external aids;Patient/family education;Functional tasks;Dysphagia/aspiration precaution training;Cueing hierarchy    Pain Pain Assessment Pain Score: 0-No pain Pain Type:  (body) Pain Onset: On-going  Prior Functioning Cognitive/Linguistic Baseline: Baseline deficits Baseline deficit details: In previous admission: son reports cognitive impairments, had brain surgery in 1995 and function has declined since wife passed 2 years ago Type of Home: House  Lives With: Alone;Family;Other (Comment) Available Help at Discharge: Family;Available PRN/intermittently Vocation: On disability  SLP Evaluation Cognition Overall Cognitive Status: Within Functional Limits for tasks assessed Arousal/Alertness: Awake/alert Orientation Level: Oriented X4 Attention: Selective;Sustained Focused Attention: Impaired Focused Attention Impairment: Verbal basic;Functional basic Sustained Attention: Appears intact Sustained Attention Impairment: Verbal basic;Functional basic Selective Attention: Impaired Selective Attention Impairment: Functional complex;Verbal complex Memory: Impaired Memory Impairment: Decreased recall of new information;Storage deficit Decreased Short Term Memory: Verbal basic;Functional basic Awareness: Impaired Awareness Impairment: Emergent impairment Problem Solving: Impaired Problem Solving Impairment: Functional complex;Verbal complex Safety/Judgment: Appears intact  Comprehension Auditory Comprehension Overall Auditory Comprehension: Appears within functional  limits for tasks assessed Yes/No  Questions: Within Functional Limits Commands: Within Functional Limits Expression Expression Primary Mode of Expression: Verbal Verbal Expression Overall Verbal Expression: Appears within functional limits for tasks assessed Written Expression Dominant Hand: Right Oral Motor Oral Motor/Sensory Function Overall Oral Motor/Sensory Function: Within functional limits Motor Speech Overall Motor Speech: Appears within functional limits for tasks assessed Respiration: Within functional limits Phonation: Normal Resonance: Within functional limits  Care Tool Care Tool Cognition Ability to hear (with hearing aid or hearing appliances if normally used Ability to hear (with hearing aid or hearing appliances if normally used): 1. Minimal difficulty - difficulty in some environments (e.g. when person speaks softly or setting is noisy)   Expression of Ideas and Wants Expression of Ideas and Wants: 4. Without difficulty (complex and basic) - expresses complex messages without difficulty and with speech that is clear and easy to understand   Understanding Verbal and Non-Verbal Content Understanding Verbal and Non-Verbal Content: 3. Usually understands - understands most conversations, but misses some part/intent of message. Requires cues at times to understand  Memory/Recall Ability Memory/Recall Ability : Current season;Location of own room;Staff names and faces;That he or she is in a hospital/hospital unit   PMSV Assessment  PMSV Trial    Bedside Swallowing Assessment General Date of Onset: 09/13/21 Previous Swallow Assessment: MBS 5/16 dys 3 textures and thin liquids, intermittment throat clear Diet Prior to this Study: Dysphagia 3 (soft);Thin liquids Respiratory Status: Supplemental O2 delivered via (comment) History of Recent Intubation: Yes Length of Intubations (days): 10 days Date extubated: 09/12/21 Behavior/Cognition: Alert Oral Cavity - Dentition: Adequate natural dentition;Missing  dentition Self-Feeding Abilities: Able to feed self Vision: Functional for self-feeding Patient Positioning: Upright in bed Baseline Vocal Quality: Normal Volitional Cough: Strong Volitional Swallow: Able to elicit  Oral Care Assessment Does patient have any of the following "high(er) risk" factors?: None of the above Does patient have any of the following "at risk" factors?: Oxygen therapy - cannula, mask, simple oxygen devices Patient is HIGH RISK: Non-ventilated: Order set for Adult Oral Care Protocol initiated - "High Risk Patients - Non-Ventilated" option selected  (see row information) Patient is AT RISK: Order set for Adult Oral Care Protocol initiated -  "At Risk Patients" option selected (see row information) Patient is LOW RISK: Follow universal precautions (see row information) Ice Chips Ice chips: Not tested Thin Liquid Thin Liquid: Within functional limits Presentation: Straw Nectar Thick Nectar Thick Liquid: Not tested Honey Thick Honey Thick Liquid: Not tested Puree Puree: Not tested Solid Solid: Impaired Pharyngeal Phase Impairments: Throat Clearing - Delayed BSE Assessment Risk for Aspiration Impact on safety and function: Mild aspiration risk Other Related Risk Factors: Prolonged intubation  Short Term Goals: Week 1: SLP Short Term Goal 1 (Week 1): Pt will demonstrate selective attention during functional tasks with supervision A verbal cues for redirection in 30-45 minute intervals. SLP Short Term Goal 2 (Week 1): Pt will demonstrated recall of daily, novel information with use of min A verbla cues for external/internal aids. SLP Short Term Goal 3 (Week 1): Pt will demonstrate functional basic-mildly complex problem solving skills with min A verbal cues. SLP Short Term Goal 4 (Week 1): Pt will self-monitor and self-correct functional errors with min A verbal cues. SLP Short Term Goal 5 (Week 1): Pt will tolerate regular textures and thin liquids with  supervision A verbal cues for use of swallow stratgeies.  Refer to Care Plan for Long Term Goals  Recommendations for other services: None  Discharge Criteria: Patient will be discharged from SLP if patient refuses treatment 3 consecutive times without medical reason, if treatment goals not met, if there is a change in medical status, if patient makes no progress towards goals or if patient is discharged from hospital.  The above assessment, treatment plan, treatment alternatives and goals were discussed and mutually agreed upon: by patient  Ashni Lonzo  [facility] 10/02/2021, 2:31 PM

## 2021-10-02 NOTE — Progress Notes (Signed)
Patient ID: Ricky Hayden, male   DOB: 05-22-1945, 76 y.o.   MRN: [id number]  Inpatient Rehabilitation Care Coordinator Assessment and Plan Patient Details  Name: Ricky Hayden MRN: [id number] Date of Birth: 10-13-1945   Today's Date: 09/23/2021   Hospital Problems: Principal Problem:   Debility   Past Medical History:      Past Medical History:  Diagnosis Date   Arthritis      osteoarthritis of left knee   Ascending aorta dilatation (HCC)     Balanitis      recurrent   Cardiac arrhythmia      life threatening, secondary to CCB vs b- blockers   Cardiomyopathy ([place])     Chronic joint pain     Chronic systolic CHF (congestive heart failure) (HCC)     CKD (chronic kidney disease), stage III (HCC)     COPD (chronic obstructive pulmonary disease) ([place])     Coronary artery disease     Dilated aortic root (HCC)     Enlarged prostate     Erectile dysfunction      secondary to Peyronie's disease   GERD (gastroesophageal reflux disease)     Gout     Hiatal hernia     Hyperlipidemia     Hypertension     Intracranial hematoma ([place]) 1995    history of, s/p evacuation by Dr. Sherwood Gambler   Nocturia     Obesity     Pansinusitis      a.  complicated by brain abscess and bleeding requiring craniotomy in 1995.   PONV (postoperative nausea and vomiting)     Sinusitis      s/p ethmoidectomy and nasal septoplasty   Vertigo      intermitantly    Past Surgical History:       Past Surgical History:  Procedure Laterality Date   BIV UPGRADE N/A 01/29/2020    Procedure: BIV UPGRADE;  Surgeon: Evans Lance, MD;  Location: [place] [facility];  Service: Cardiovascular;  Laterality: N/A;   CIRCUMCISION N/A 11/20/2013    Procedure: CIRCUMCISION ADULT;  Surgeon: Claybon Jabs, MD;  Location: [facility];  Service: Urology;  Laterality: N/A;   [place]    hematomy due to sinus infection    PACEMAKER IMPLANT N/A 01/17/2020    Procedure: PACEMAKER IMPLANT;  Surgeon: Deboraha Sprang, MD;   Location: [place] [facility];  Service: Cardiovascular;  Laterality: N/A;   RIGHT/LEFT HEART CATH AND CORONARY ANGIOGRAPHY N/A 09/20/2019    Procedure: RIGHT/LEFT HEART CATH AND CORONARY ANGIOGRAPHY;  Surgeon: Jolaine Artist, MD;  Location: [place] [facility];  Service: Cardiovascular;  Laterality: N/A;   shoulder surg rt    1995   SINUS SURGERY WITH INSTATRAK        ethmoidectomy and nasal septum repair    Social History:  reports that he quit smoking about 35 years ago. He has never used smokeless tobacco. He reports that he does not drink alcohol and does not use drugs.   Family / Support Systems Marital Status: Widow/Widower How Long?: 2021 Patient Roles: Parent Children: Ricky Hayden-daughter [phone]  Ricky Hayden-daughter [phone] Ricky Hayden [phone] Other Supports: Another son Anticipated Caregiver: All four children are involved-Ashely and Ricky Hayden are main contacts. Hopeful can get mod/i during day since all work but will work on this Ability/Limitations of Caregiver: Children hopeful can reach at least supervision at this point does not have 24/7 care in place Caregiver Availability: Other (Comment) (Working on 24/7 care plan) Family  Dynamics: Close with all four children who are very involved and will assist. All work but will make accommendations to assist and have since their Mom died two years ago   Social History Preferred language: English Religion: Baptist Cultural Background: No issues Education: [place] - How often do you need to have someone help you when you read instructions, pamphlets, or other written material from your doctor or pharmacy?: Never Writes: Yes Employment Status: Retired Public relations account executive Issues: No issues Guardian/Conservator: None-according to MD pt is not fully capable of making his own decisions while here. WIll look toward his children for any decisions while here    Abuse/Neglect Abuse/Neglect Assessment Can Be Completed:  Yes Physical Abuse: Denies Verbal Abuse: Denies Sexual Abuse: Denies Exploitation of patient/patient's resources: Denies Self-Neglect: Denies   Patient response to: Social Isolation - How often do you feel lonely or isolated from those around you?: Never   Emotional Status Pt's affect, behavior and adjustment status: Pt is able to answer questions but still somewhat confused. He wants to go home from here and plans on going. His children are here daily and very supportive of him. He hopes to do well here Recent Psychosocial Issues: multiple ,medical issues and recent loss of wife Psychiatric History: No issues once more cognitively appropriate would benefit from seeing neuro-psych while here. Has had to deal with much in the past two years. Substance Abuse History: no issues   Patient / Family Perceptions, Expectations & Goals Pt/Family understanding of illness & functional limitations: Daughtr has a good understanding of his health issues and talks with the MD every other day and wants to be kept updated on any medical information. She updates her siblings and they all visit. Pt knows he is in the hospital and feels due to heart issues. Premorbid pt/family roles/activities: Father, grandfather, retiree, Anticipated changes in roles/activities/participation: resume Pt/family expectations/goals: Pt states: " I want to go home from here and not somewhere else."  Ricky Hayden states: " We want him to go home but need to be able to manage him."   [employer]: Other (Comment) ([facility] [place]) Premorbid Home Care/DME Agencies: Other (Comment) (has hospital bed, home O2, rollator and tub seat) Transportation available at discharge: Pt drove PTA but children can assist with Is the patient able to respond to transportation needs?: Yes In the past 12 months, has lack of transportation kept you from medical appointments or from getting medications?: No In the past 12 months,  has lack of transportation kept you from meetings, work, or from getting things needed for daily living?: No Resource referrals recommended: Neuropsychology   Discharge Planning Living Arrangements: Alone Support Systems: Children, Friends/neighbors, Other relatives Type of Residence: Private residence Insurance Resources: Multimedia programmer (specify) ([facility]) Financial Resources: Social Security Financial Screen Referred: No Living Expenses: Own Money Management: Patient, Family Does the patient have any problems obtaining your medications?: No Home Management: Pt and children Patient/Family Preliminary Plans: Return home with children assisting currently they stay at night with him and on the weekends. They all work during the day and would need to come up with a plan for this if needed. [facility] but does not have aides at home. Care Coordinator Barriers to Discharge: Decreased caregiver support Care Coordinator Anticipated Follow Up Needs: HH/OP Expected length of stay: 14-16 Days   Clinical Impression Pleasant yet slightly confused patient who wants to do well here and return home. His four  children are all involved and assist with his care. They currently do not have a caregiver during the day for him and are hopeful he does well here. One of hem stays at nights and on weekends and has for the past two years since mom passed away. Will work together on discharge needs.   Elease Hashimoto 09/23/2021, 11:14 AM  Addendhum: Pt has returned to CIR and is motivated to do well and get back home. His children are hopeful he will reach mod/I-supervision since currently they do not have care in place fore weekday daytime.           Note Details  Author Neale Burly File Time 09/23/2021 11:16 AM  Author Type Social Worker Status Signed  Last Editor Sheryl Saintil, Gardiner Rhyme, LCSW Specialty Licensed Clinical Social [facility] Acct # [id number] [place]  Date 10/01/2021

## 2021-10-02 NOTE — Progress Notes (Signed)
Notified Reesa Chew, Pa of patient's blood pressures/blood pressure trends. No new orders at this time.  Yehuda Mao, LPN

## 2021-10-02 NOTE — Evaluation (Signed)
Physical Therapy Assessment and Plan  Patient Details  Name: Ricky Hayden MRN: [id number] Date of Birth: May 02, 1946  PT Diagnosis: Abnormal posture, Abnormality of gait, Cognitive deficits, Difficulty walking, Impaired cognition, Muscle weakness, and Pain in "all over" Rehab Potential: Good ELOS: 10-12 days   Today's Date: 10/02/2021 PT Individual Time: 9485-4627 PT Individual Time Calculation (min): 55 min    Hospital Problem: Principal Problem:   Debility   Past Medical History:  Past Medical History:  Diagnosis Date   Arthritis    osteoarthritis of left knee   Ascending aorta dilatation ([place])    Balanitis    recurrent   Cardiac arrhythmia    life threatening, secondary to CCB vs b- blockers   Cardiomyopathy ([place])    Chronic joint pain    Chronic systolic CHF (congestive heart failure) ([place])    CKD (chronic kidney disease), stage III ([place])    COPD (chronic obstructive pulmonary disease) ([place])    Coronary artery disease    Dilated aortic root ([place])    Enlarged prostate    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 Sherwood Gambler   Nocturia    Obesity    Pansinusitis    a.  complicated by brain abscess and bleeding requiring craniotomy in 1995.   PONV (postoperative nausea and vomiting)    Sinusitis    s/p ethmoidectomy and nasal septoplasty   Vertigo    intermitantly   Past Surgical History:  Past Surgical History:  Procedure Laterality Date   BIV UPGRADE N/A 01/29/2020   Procedure: BIV UPGRADE;  Surgeon: Evans Lance, MD;  Location: [facility];  Service: Cardiovascular;  Laterality: N/A;   CARDIOVERSION N/A 09/26/2021   Procedure: CARDIOVERSION;  Surgeon: [name], MD;  Location: [facility];  Service: Cardiovascular;  Laterality: N/A;   CIRCUMCISION N/A  11/20/2013   Procedure: CIRCUMCISION ADULT;  Surgeon: Claybon Jabs, MD;  Location: [facility];  Service: Urology;  Laterality: N/A;   [place]   hematomy due to sinus infection    PACEMAKER IMPLANT N/A 01/17/2020   Procedure: PACEMAKER IMPLANT;  Surgeon: Deboraha Sprang, MD;  Location: [facility];  Service: Cardiovascular;  Laterality: N/A;   RIGHT/LEFT HEART CATH AND CORONARY ANGIOGRAPHY N/A 09/20/2019   Procedure: RIGHT/LEFT HEART CATH AND CORONARY ANGIOGRAPHY;  Surgeon: Jolaine Artist, MD;  Location: [facility];  Service: Cardiovascular;  Laterality: N/A;   shoulder surg rt   1995   SINUS SURGERY WITH INSTATRAK     ethmoidectomy and nasal septum repair   TEE WITHOUT CARDIOVERSION N/A 09/26/2021   Procedure: TRANSESOPHAGEAL ECHOCARDIOGRAM (TEE);  Surgeon: [name], MD;  Location: [facility];  Service: Cardiovascular;  Laterality: N/A;    Assessment & Plan Clinical Impression: Patient is a 76 y.o. year old male with history of Seizure d/o, CKD-baseline SCr 1.5, COPD- oxygen HS, CAD/cardiac arrhthymias s/p ICD/PPM, pre-diabetes;  who was originally admitted on 08/29/21 with Sepsis due to proteus bacteremia and urinary retention and started on Cefazolin but developed respiratory distress requiring intubation followed by brief PEA arrest and aspiration PNA with septic shock and ARF due to ATN.  Antibiotics transitioned to Unasyn with end date of 05/10 and treated with stress dose steriods. He was noted to have  generalized weakness with delay in processing and poor awareness of deficits.    He was admitted to CIR on 05/19/123 for intensive rehab program and continued to require supplemental oxygen with bouts of lethargy. Hospital course significant for hypotension with wide complex tachycardia despite loading with amiodarone 400 mg bid. He was transferred to acute hospital on 05/25 and underwent TEE/DCCV on 05/26 to NSR. Stress dose steroids added to help with  respiratory status. Coreg added on 05/29 with recommendations to taper amiodarone over next few weeks.  He was extubated to 3 L oxygen and Eliquis changed to coumadin with lovenox bridge to avoid interaction with phenobarbital. BP has stabilized but does have transient orthostatic symptoms, respiratory status. PT/OT has been working with patient and he is showing improvement in mentation but continues to be limited by debility. CIR recommended due to functional decline.  Pt reports very fatigued today. He is happy to be back at CIR. Reports constipation. Reports he is sleeping well.   Patient currently requires mod with mobility secondary to muscle weakness, decreased cardiorespiratoy endurance and decreased oxygen support, decreased memory, and decreased standing balance, decreased postural control, and decreased balance strategies.  Prior to hospitalization, patient was modified independent  with mobility and lived with Alone, Family, Other (Comment) in a House home.  Home access is 1 step into houseStairs to enter.  Patient will benefit from skilled PT intervention to maximize safe functional mobility, minimize fall risk, and decrease caregiver burden for planned discharge home with intermittent assist.  Anticipate patient will benefit from follow up [facility] at discharge.  PT - End of Session Activity Tolerance: Tolerates 30+ min activity with multiple rests Endurance Deficit: Yes Endurance Deficit Description: pt reported fatigue and required rest breaks with minimal activity PT Assessment Rehab Potential (ACUTE/IP ONLY): Good PT Barriers to Discharge: [place] home environment;Decreased caregiver support;Home environment access/layout;Other (comments) PT Barriers to Discharge Comments: pain, lives alone and children can provide intermittent assist, generalized weakness/deconditioning PT Patient demonstrates impairments in the following area(s): Balance;Endurance;Motor;Nutrition;Pain PT Transfers  Functional Problem(s): Bed Mobility;Bed to Chair;Car;Furniture PT Locomotion Functional Problem(s): Ambulation;Wheelchair Mobility;Stairs PT Plan PT Intensity: Minimum of 1-2 x/day ,45 to 90 minutes PT Frequency: 5 out of 7 days PT Duration Estimated Length of Stay: 10-12 days PT Treatment/Interventions: Ambulation/gait training;Balance/vestibular training;Discharge planning;Community reintegration;DME/adaptive equipment instruction;Neuromuscular re-education;Functional mobility training;Patient/family education;Stair training;Splinting/orthotics;Therapeutic Activities;Therapeutic Exercise;UE/LE Strength taining/ROM;UE/LE Coordination activities;Visual/perceptual remediation/compensation;Wheelchair propulsion/positioning;Disease management/prevention;Skin care/wound management;Cognitive remediation/compensation;Pain management PT Transfers Anticipated Outcome(s): supervision with LRAD PT Locomotion Anticipated Outcome(s): supervision with LRAD PT Recommendation Follow Up Recommendations: Home health PT Patient destination: Home Equipment Recommended: To be determined Equipment Details: has rollator   PT Evaluation Precautions/Restrictions Precautions Precautions: Fall Precaution Comments: monitor O2 Restrictions Weight Bearing Restrictions: No Pain Interference Pain Interference Pain Effect on Sleep: 0. Does not apply - I have not had any pain or hurting in the past 5 days Pain Interference with Therapy Activities: 0. Does not apply - I have not received rehabilitationtherapy in the past 5 days Pain Interference with Day-to-Day Activities: 1. Rarely or not at all Home Living/Prior [place] Available Help at Discharge: Family;Available PRN/intermittently Type of Home: House Home Access: Stairs to enter [employer] of Steps: 1 step into house Entrance Stairs-Rails: None Home Layout: One level Bathroom Shower/Tub: Walk-in shower;Tub/shower unit (walk-in  shower has a lip of estimated 10 inches) Bathroom Toilet: Handicapped height Bathroom Accessibility: Yes (RW not WC) Additional Comments: used rollator for longer distances  Lives With: Alone;Family;Other (Comment) Prior Function Level of Independence: Requires  assistive device for independence Meal Prep: Maximal (based on current performance in relation to LB) Laundry: Total Vacuuming: Total Light Housekeeping: Maximal Shopping: Total  Able to Take Stairs?: Yes Driving: No Vocation: On disability Vision/Perception  Perception Perception: Within Functional Limits  Cognition Overall Cognitive Status: Within Functional Limits for tasks assessed Arousal/Alertness: Awake/alert Orientation Level: Oriented X4 Attention: Selective;Sustained Focused Attention: Impaired Focused Attention Impairment: Verbal basic;Functional basic Sustained Attention: Appears intact Sustained Attention Impairment: Verbal basic;Functional basic Selective Attention: Impaired Selective Attention Impairment: Functional complex;Verbal complex Memory: Impaired Memory Impairment: Decreased recall of new information;Storage deficit Decreased Short Term Memory: Verbal basic;Functional basic Awareness: Impaired Awareness Impairment: Emergent impairment Problem Solving: Impaired Problem Solving Impairment: Functional complex;Verbal complex Safety/Judgment: Appears intact Sensation Sensation Light Touch: Appears Intact Peripheral sensation comments: sensation associated with bilateral hands digits 4, 5 and associated palmar region Light Touch Impaired Details: Absent RUE;Absent LUE Proprioception: Appears Intact Stereognosis: Appears Intact Additional Comments: pt reports numbness in bilateral LEs Coordination Gross Motor Movements are Fluid and Coordinated: No Fine Motor Movements are Fluid and Coordinated: No Coordination and Movement Description: mild uncoordination due to fatigue, generalized  weakness/deconditioning, and pain "all over" Finger Nose Finger Test: [facility] bilaterally Heel Shin Test: decreased ROM bilaterally Motor  Motor Motor: Abnormal postural alignment and control Motor - Skilled Clinical Observations: mild uncoordination due to fatigue, generalized weakness/deconditioning, and pain "all over"  Trunk/Postural Assessment  Cervical Assessment Cervical Assessment: Exceptions to [facility] (forward head) Thoracic Assessment Thoracic Assessment: Exceptions to [facility] (rounded shoulders) Thoracic Strength Thoracic Flexion: 3/5 Lumbar Assessment Lumbar Assessment: Exceptions to [facility] (posterior pelvic tilt) Postural Control Postural Control: Deficits on evaluation  Balance Balance Balance Assessed: Yes Static Sitting Balance Static Sitting - Balance Support: Feet supported;Bilateral upper extremity supported Static Sitting - Level of Assistance: 6: Modified independent (Device/Increase time) Dynamic Sitting Balance Dynamic Sitting - Balance Support: Feet supported;No upper extremity supported Dynamic Sitting - Level of Assistance: 5: Stand by assistance (supervision) Sitting balance - Comments: patient able to come from supine to EOB with SBA and ModA for returning to supine Static Standing Balance Static Standing - Balance Support: Bilateral upper extremity supported;During functional activity (RW) Static Standing - Level of Assistance: 4: Min assist Dynamic Standing Balance Dynamic Standing - Balance Support: Bilateral upper extremity supported;During functional activity (RW) Dynamic Standing - Level of Assistance: 3: Mod assist Extremity Assessment  RLE Assessment RLE Assessment: Exceptions to [facility] RLE Strength Right Hip Flexion: 3-/5 Right Hip ABduction: 3+/5 Right Hip ADduction: 3+/5 Right Knee Flexion: 3+/5 Right Knee Extension: 3+/5 Right Ankle Dorsiflexion: 4-/5 Right Ankle Plantar Flexion: 4-/5 LLE Assessment LLE Assessment: Exceptions to [facility] LLE  Strength Left Hip Flexion: 3-/5 Left Hip ABduction: 3+/5 Left Hip ADduction: 3+/5 Left Knee Flexion: 3+/5 Left Knee Extension: 3+/5 Left Ankle Dorsiflexion: 4-/5 Left Ankle Plantar Flexion: 4-/5  Care Tool Care Tool Bed Mobility Roll left and right activity   Roll left and right assist level: Supervision/Verbal cueing    Sit to lying activity   Sit to lying assist level: Minimal Assistance - Patient > 75%    Lying to sitting on side of bed activity  Lying to sitting on side of bed assist level: the ability to move from lying on the back to sitting on the side of the bed with no back support.: Supervision/Verbal cueing     Care Tool Transfers Sit to stand transfer Sit to stand activity did not occur: Safety/medical concerns (pt able to perform task using the [place]  and additional time.)  Sit to stand assist level: Moderate Assistance - Patient 50 - 74%    Chair/bed transfer   Chair/bed transfer assist level: Minimal Assistance - Patient > 75%     Toilet transfer Toilet transfer activity did not occur:  (Based on observtion dependent) Assist Level: Dependent - Patient 0%    Scientist, product/process development transfer activity did not occur: Safety/medical concerns (fatigue, weakness, pain)        Care Tool Locomotion Ambulation Ambulation activity did not occur: Safety/medical concerns (fatigue, weakness, pain)        Walk 10 feet activity Walk 10 feet activity did not occur: Safety/medical concerns (fatigue, weakness, pain)       Walk 50 feet with 2 turns activity Walk 50 feet with 2 turns activity did not occur: Safety/medical concerns (fatigue, weakness, pain)      Walk 150 feet activity Walk 150 feet activity did not occur: Safety/medical concerns (fatigue, weakness, pain)      Walk 10 feet on uneven surfaces activity Walk 10 feet on uneven surfaces activity did not occur: Safety/medical concerns (fatigue, weakness, pain)      Stairs Stair activity did not occur: Safety/medical  concerns (fatigue, weakness, pain)        Walk up/down 1 step activity Walk up/down 1 step or curb (drop down) activity did not occur: Safety/medical concerns (fatigue, weakness, pain)      Walk up/down 4 steps activity Walk up/down 4 steps activity did not occur: Safety/medical concerns (fatigue, weakness, pain)      Walk up/down 12 steps activity Walk up/down 12 steps activity did not occur: Safety/medical concerns (fatigue, weakness, pain)      Pick up small objects from floor Pick up small object from the floor (from standing position) activity did not occur: Safety/medical concerns (fatigue, weakness, pain)      Wheelchair Is the patient using a wheelchair?: Yes Type of Wheelchair: Manual   Wheelchair assist level: Supervision/Verbal cueing Max wheelchair distance: 47f  Wheel 50 feet with 2 turns activity   Assist Level: Supervision/Verbal cueing  Wheel 150 feet activity   Assist Level: Dependent - Patient 0%    Refer to Care Plan for Long Term Goals  SHORT TERM GOAL WEEK 1 PT Short Term Goal 1 (Week 1): pt will transfer sit<>stand with LRAD and minA PT Short Term Goal 2 (Week 1): pt will transfer bed<>chair with LRAD and CGA PT Short Term Goal 3 (Week 1): pt will ambulate 112fwith LRAD and min A  Recommendations for other services: None   Skilled Therapeutic Intervention Evaluation completed (see details above and below) with education on PT POC and goals and individual treatment initiated with focus on functional mobility/transfers, generalized strengthening and endurance, and dynamic standing balance/coordination. Received pt semi-reclined in bed, pt educated on PT evaluation, CIR policies, and therapy schedule and agreeable. Pt reported pain 8/10 "all over" RN notified and present to administer pain medication. Pt on 2L O2 via [place] at rest with O2 sats 96%. Pt transferred semi-reclined<>sitting EOB with HOB slightly elevated and use of bedrails with close supervision. Pt  stood from EOB with RW and heavy mod A and performed standing marches in place with min A for balance. Provided pt with 20x18 manual WC and cushion and pt transferred bed<>WC stand<>pivot with RW and min A. Removed O2 and sats 95% on RA. Pt performed WC mobility 5080fsing BUE and supervision - limited by mild SOB. O2 sats dropped to 89% on RA with mobility but able  to recover to 93-94% with pursed lip breathing. Concluded session with pt sitting in WC, needs within reach, and chair pad alarm on. Safety plan updated and pt left on RA with O2 sats 93% and NT present at bedside planning to update RN on O2 sats.   Mobility Bed Mobility Bed Mobility: Rolling Left;Left Sidelying to Sit Rolling Left: Supervision/Verbal cueing Left Sidelying to Sit: Supervision/Verbal cueing Supine to Sit: Moderate Assistance - Patient 50-74% Sit to Supine: Moderate Assistance - Patient 50-74% Transfers Transfers: Sit to Stand Sit to Stand: Moderate Assistance - Patient 50-74% Stand to Sit: Minimal Assistance - Patient > 75% Stand Pivot Transfers: Minimal Assistance - Patient > 75% Stand Pivot Transfer Details: Verbal cues for technique Stand Pivot Transfer Details (indicate cue type and reason): verbal cues for turning technique Transfer (Assistive device): Rolling walker Transfer via Lift Equipment: Probation officer Ambulation: No Gait Gait: No Stairs / Additional Locomotion Stairs: No Architect: Yes Wheelchair Assistance: Chartered loss adjuster: Both upper extremities Wheelchair Parts Management: Needs assistance Distance: 73f   Discharge Criteria: Patient will be discharged from PT if patient refuses treatment 3 consecutive times without medical reason, if treatment goals not met, if there is a change in medical status, if patient makes no progress towards goals or if patient is discharged from hospital.  The above assessment, treatment  plan, treatment alternatives and goals were discussed and mutually agreed upon: by patient  AAlfonse AlpersPT, DPT  10/02/2021, 3:05 PM

## 2021-10-02 NOTE — Progress Notes (Signed)
Nutrition Brief Note  RD received a consult for diet education on Vitamin K and Warfarin.  RD provided "Vitamin K and Medication" and " Vitamin K Content of Foods' handout from the Academy of Nutrition and Dietetics. Reviewed each handout with pt. Discussed the importance of keeping Vitamin K intake consistent each day and reviewed the list of foods that are high, moderate, low, and free of Vitamin K. Teach back method was used. Pt with no other questions at this time.   Expect fair compliance.  Pt reports that his appetite was great at home and continues to be good. Reports that he weighs himself every day to monitor for fluid and he stays within a  range up or down. Denies any recent weight loss.  Pt currently on a Regular diet, with meal completions of 100% recorded within EMR. Labs and medications reviewed.  No additional nutrition interventions warranted at this time. Please re-consult if additional nutrition needs arise.    Hermina Barters RD, LDN Clinical Dietitian See Shea Evans for contact information.

## 2021-10-02 NOTE — Progress Notes (Signed)
PROGRESS NOTE   Subjective/Complaints: No new concerns or complaints. Therapy evals today.  Review of Systems  Constitutional:  Negative for chills and fever.  Respiratory:  Negative for cough and shortness of breath.   Cardiovascular:  Negative for chest pain and palpitations.  Gastrointestinal:  Negative for abdominal pain, nausea and vomiting.     Objective:   No results found. Recent Labs    10/01/21 0712 10/02/21 0535  WBC 5.0 4.7  HGB 10.2* 9.8*  HCT 32.5* 29.6*  PLT 113* 107*   Recent Labs    10/01/21 1709 10/02/21 0535  NA 138 141  K 5.2* 4.2  CL 102 105  CO2 31 32  GLUCOSE 110* 86  BUN 13 14  CREATININE 1.14 1.25*  CALCIUM 9.4 9.4    Intake/Output Summary (Last 24 hours) at 10/02/2021 0732 Last data filed at 10/02/2021 0500 Gross per 24 hour  Intake 260 ml  Output 700 ml  Net -440 ml     Pressure Injury 09/09/21 Buttocks Medial;Bilateral Deep Tissue Pressure Injury - Purple or maroon localized area of discolored intact skin or blood-filled blister due to damage of underlying soft tissue from pressure and/or shear. purple/red with petechi (Active)  09/09/21 2100  Location: Buttocks  Location Orientation: Medial;Bilateral  Staging: Deep Tissue Pressure Injury - Purple or maroon localized area of discolored intact skin or blood-filled blister due to damage of underlying soft tissue from pressure and/or shear.  Wound Description (Comments): purple/red with petechiae  Present on Admission: No     Pressure Injury 09/17/21 Anus Medial Stage 2 -  Partial thickness loss of dermis presenting as a shallow open injury with a red, pink wound bed without slough. (Active)  09/17/21 1115  Location: Anus  Location Orientation: Medial  Staging: Stage 2 -  Partial thickness loss of dermis presenting as a shallow open injury with a red, pink wound bed without slough.  Wound Description (Comments):   Present on  Admission:     Physical Exam: Vital Signs Blood pressure 124/76, pulse 60, temperature 98.3 F (36.8 C), temperature source Oral, resp. rate 17, weight 135 kg, SpO2 99 %.    Assessment/Plan: 1. Functional deficits which require 3+ hours per day of interdisciplinary therapy in a comprehensive inpatient rehab setting. Physiatrist is providing close team supervision and 24 hour management of active medical problems listed below. Physiatrist and rehab team continue to assess barriers to discharge/monitor patient progress toward functional and medical goals  Care Tool:  Bathing              Bathing assist       Upper Body Dressing/Undressing Upper body dressing        Upper body assist      Lower Body Dressing/Undressing Lower body dressing      What is the patient wearing?: Incontinence brief     Lower body assist Assist for lower body dressing: Maximal Assistance - Patient 25 - 49%     Toileting Toileting    Toileting assist       Transfers Chair/bed transfer  Transfers assist           Locomotion Ambulation   Ambulation assist  Walk 10 feet activity   Assist           Walk 50 feet activity   Assist           Walk 150 feet activity   Assist           Walk 10 feet on uneven surface  activity   Assist           Wheelchair     Assist               Wheelchair 50 feet with 2 turns activity    Assist            Wheelchair 150 feet activity     Assist          Blood pressure 124/76, pulse 60, temperature 98.3 F (36.8 C), temperature source Oral, resp. rate 17, weight 135 kg, SpO2 99 %.  General: Alert and oriented to self, Rudene Anda, location, Reported year as 2032 and gave June 1 as the date (its may 31) No apparent distress. In bed appears comfortable HEENT: Healed crani incision, PERRLA, EOMI, sclera anicteric, oral mucosa pink and moist, Neck: Supple without JVD or  lymphadenopathy Heart: Reg rate and rhythm.  Chest: CTA bilaterally without wheezes, rales, or rhonchi; no distress, on RA Abdomen: Soft, non-tender, non-distended, bowel sounds positive. Extremities: No clubbing, cyanosis, or edema. Pulses are 2+ Psych: Pt's affect is appropriate. Pt is cooperative Skin: Clean and intact without signs of breakdown Neuro:  Follows commands, Answer questions, able to repeat 3 words, makes eye contact. Sensation intact in all 4 extremities although reports altered sensation digits 4+5 both hands, tinels neg at elbow and wrist, intrensic hand muscles atrophy noted b/l Musculoskeletal: No joint swelling or tenderness 5/5 b/l UE strength 5/5 in b/l LE with exception of Hip flexion 4/5 bilaterally Normal bulk and tone L PIV    Medical Problem List and Plan: 1. Functional deficits secondary to Debility due to septic shock              -patient may shower             -ELOS/Goals: 10-14 days, mod I PT/OT/SLP  -CIR PT/OT/SLP evals 2.  Antithrombotics: -DVT/anticoagulation:  Pharmaceutical: Coumadin and Lovenox             -antiplatelet therapy: NA 3. Pain Management:  Tylenol prn.  4. Mood: LCSW to follow for evaluation and support.              -antipsychotic agents: N/A 5. Neuropsych: This patient is capable of making decisions on his own behalf. 6. Skin/Wound Care: Routine pressure relief measures.  7. Fluids/Electrolytes/Nutrition:  Monitor I/O. Check CMET in am. 8. CAD/ICM s/p BiV: Monitor weights daily and for symptoms with increase in activity.             --continue Coreg, Jardiance, pravastatin and Entresto.              --low salt HH diet.              -Denies chest pain  -Cardiology signed off 6/1, appreciate assistance 9. A fib w/RVR: HR controlled on amiodarone and Coreg.             --monitor for symptoms with increase in activity              -Denies palpitations 10 . Pre-diabetes: Hgb A1C- 5.7. Monitor BS ac/hs--should improve with d/c of  prednisone today             --  continue insulin glargline but wean to off as BS improve.   -6/1Glucose well controlled this AM, Will decrease to 5 units 11. CKD III: Avoid nephrotoxic meds. Recheck CMET in am.  -Cr 1.18 5/31 -6/1 Cr 1.25, stable overall 12. BPH: Will monitor voiding with bladder scan.  13. 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   14. COPD: Pulmonary hygiene. Completes prednisone 40 mg X 5 days to day. --Continue Incruse daily.  -Continue O2  -Denies shortness of breath 15. Constipation             -Increase miralax to BID  -Will add sorbitol, Dulcolax supp prn    LOS: 1 days A FACE TO FACE EVALUATION WAS PERFORMED  Jennye Boroughs 10/02/2021, 7:32 AM

## 2021-10-02 NOTE — Progress Notes (Signed)
   Patient has been discharged to CIR. BP improving, will resume home dose Coreg 6.25mg  BID today. Continue the rest of GDMT for CHF with Ferne Coe. Please call cardiology back if your have further questions. We will sign off now.

## 2021-10-03 DIAGNOSIS — N390 Urinary tract infection, site not specified: Secondary | ICD-10-CM

## 2021-10-03 LAB — URINALYSIS, ROUTINE W REFLEX MICROSCOPIC
Bilirubin Urine: NEGATIVE
Glucose, UA: >=500 mg/dL — AB
Ketones, ur: NEGATIVE mg/dL
Nitrite: NEGATIVE
Protein, ur: NEGATIVE mg/dL
Specific Gravity, Urine: 1.015 (ref 1.005–1.030)
WBC, UA: >50 WBC/hpf — ABNORMAL HIGH (ref 0–5)
pH: 7 (ref 5.0–8.0)

## 2021-10-03 LAB — GLUCOSE, CAPILLARY
Glucose-Capillary: 88 mg/dL (ref 70–99)
Glucose-Capillary: 80 mg/dL (ref 70–99)
Glucose-Capillary: 78 mg/dL (ref 70–99)
Glucose-Capillary: 149 mg/dL — ABNORMAL HIGH (ref 70–99)

## 2021-10-03 LAB — PROTIME-INR
INR: 1.4 — ABNORMAL HIGH (ref 0.8–1.2)
Prothrombin Time: 16.7 seconds — ABNORMAL HIGH (ref 11.4–15.2)

## 2021-10-03 MED ORDER — TRAMADOL HCL 50 MG PO TABS
50.0000 mg | ORAL_TABLET | Freq: Four times a day (QID) | ORAL | Status: DC | PRN
  Administered 2021-10-03 – 2021-10-07 (×4): 50 mg via ORAL
  Filled 2021-10-03 (×4): qty 1

## 2021-10-03 MED ORDER — INSULIN GLARGINE-YFGN 100 UNIT/ML ~~LOC~~ SOLN: 4.0000 [IU] | Freq: Every day | SUBCUTANEOUS | Status: DC

## 2021-10-03 MED ORDER — GLUCERNA SHAKE PO LIQD
237.0000 mL | Freq: Three times a day (TID) | ORAL | Status: DC
  Administered 2021-10-03 – 2021-10-09 (×18): 237 mL via ORAL

## 2021-10-03 MED ORDER — CEPHALEXIN 250 MG PO CAPS
250.0000 mg | ORAL_CAPSULE | Freq: Four times a day (QID) | ORAL | Status: AC
  Administered 2021-10-03 – 2021-10-08 (×20): 250 mg via ORAL
  Filled 2021-10-03 (×20): qty 1

## 2021-10-03 MED ORDER — INSULIN GLARGINE-YFGN 100 UNIT/ML ~~LOC~~ SOLN
3.0000 [IU] | Freq: Every day | SUBCUTANEOUS | Status: DC
  Administered 2021-10-04 – 2021-10-06 (×3): 3 [IU] via SUBCUTANEOUS
  Filled 2021-10-03 (×3): qty 0.03

## 2021-10-03 NOTE — Progress Notes (Signed)
Physical Therapy Session Note  Patient Details  Name: Ricky Hayden MRN: [id number] Date of Birth: 09/06/1945  Today's Date: 10/03/2021 PT Individual Time: 1305-1400 PT Individual Time Calculation (min): 55 min   Short Term Goals: Week 1:  PT Short Term Goal 1 (Week 1): pt will transfer sit<>stand with LRAD and minA PT Short Term Goal 2 (Week 1): pt will transfer bed<>chair with LRAD and CGA PT Short Term Goal 3 (Week 1): pt will ambulate 44f with LRAD and min A  Skilled Therapeutic Interventions/Progress Updates:   Pt received sitting in WC and agreeable to PT.   WC mobility through hall with min assist  to prevent veer to the L side. x [id number]   Sit<>stand in parallel bars x 10 throughout session with supervision assist progressing to min assist due to fatigue.   Gait training in parallel bars forward reverse [id number] rest break and CGA from PT for safety.  Seated therex. Hip abduction level 2 tband. Hip flexion level 2 tband. Hip extension level 2 tband. LAQ 5# ankle weight. X 12 BLE.  Standing there: hip abduction, hip extension, mini squat x 8 each. Cues for decreased speed and hold at end range to improve strengthening aspect of movement.   PT performed orthostatic VS  Sitting: 108/66. HR 60.  Standing. 109/63 HR 70.  Kinetron BLE endurance training 2 min x 2 with therapeutic rest break between bouts. Patient returned to room and [facility] and all needs met.         Therapy Documentation Precautions:  Precautions Precautions: Fall Precaution Comments: monitor O2 Restrictions Weight Bearing Restrictions: No  Therapy Vitals Temp: 98.6 F (37 C) Temp Source: Oral Pulse Rate: (!) 59 Resp: 20 BP: 109/64 Patient Position (if appropriate): Sitting Oxygen Therapy SpO2: 98 % O2 Device: Room Air Pain: Pain Assessment Pain Scale: 0-10 Pain Score: 5  Pain Type: Acute pain;Chronic pain Pain Location: Foot Pain Orientation:  Left Pain Descriptors / Indicators: Aching Pain Intervention(s): Repositioned  Therapy/Group: Individual Therapy  AusLorie Phenix2/2023, 3:40 PM

## 2021-10-03 NOTE — Progress Notes (Signed)
Notified Reesa Chew, PA of UA results. New orders placed.

## 2021-10-03 NOTE — Progress Notes (Addendum)
Patient c/o 8/10 pain in upper back and bilateral ankles. Prn tylenol 650mg  prescribed but family and patient concerned it may not be effective. Called on call NP Luetta Nutting for order for tramadol per patient and family request. Order for tramadol 50 mg prn q6hrs placed as well as aquathermia. No other concerns to report at this time.

## 2021-10-03 NOTE — Progress Notes (Signed)
Ricky Hayden for enoxaparin> warfarin Indication: atrial fibrillation  Allergies  Allergen Reactions   Calcium Channel Blockers Other (See Comments)    Came to hospital in 1995-caused chest pain     Patient Measurements: Weight: 135 kg (297 lb 9.9 oz)  Vital Signs: Temp: 98.2 F (36.8 C) (06/02 0427) BP: 139/76 (06/02 0427) Pulse Rate: 63 (06/02 0427)  Labs: Recent Labs    09/30/21 1631 10/01/21 0712 10/01/21 1709 10/02/21 0535 10/03/21 0544  HGB  --  10.2*  --  9.8*  --   HCT  --  32.5*  --  29.6*  --   PLT  --  113*  --  107*  --   LABPROT 14.2  --   --  16.5* 16.7*  INR 1.1  --   --  1.3* 1.4*  CREATININE  --  1.18 1.14 1.25*  --      Estimated Creatinine Clearance: 65.6 mL/min (A) (by C-G formula based on SCr of 1.25 mg/dL (H)).  Assessment: 76 year old male with new PAF. Patient recently had TEE/DCCV on 5/26. Patient started on enoxaparin and warfarin bridge last night 5/30.   Per [place] comprehensive anticoagulation / drug interaction document   [email or web address]  Phenobarbital also interacts with endoxaban, ruling out using any DOAC for anticoagulation. Discussed situation with cardiology this am 5/31.   Recommend continuing enoxaparin to warfarin bridge to therapeutic INR of 2-3 after recent DCCV.   INR slowly trending up  Goal of Therapy:  INR 2-3 Heparin level 0.6-1.0 units/ml Monitor platelets by anticoagulation protocol: Yes   Plan:  Lovenox 130mg  q12 hours Warfarin 7.5mg  daily for now Daily INR and CBC every 72 hours while on warfarin  Thank you Anette Guarneri, PharmD 10/03/2021 8:34 AM

## 2021-10-03 NOTE — Progress Notes (Signed)
Occupational Therapy Session Note  Patient Details  Name: Ricky Hayden MRN: [id number] Date of Birth: 11-May-1945  Today's Date: 10/03/2021 OT Individual Time: 4696-2952 OT Individual Time Calculation (min): 45 min    Short Term Goals: Week 1:  OT Short Term Goal 1 (Week 1): Pt will complete LB ADL at EOB  with min A for balance using AE OT Short Term Goal 2 (Week 1): Pt will complete stand pivot transfer from EOB to [facility] with MinA  at 90%safe OT Short Term Goal 3 (Week 1): Pt will complete grooming activity at sink at w/c LOF with s/u only at 90% safe OT Short Term Goal 4 (Week 1): Pt will don pants with ModA using AE for addiitonal reach at chair LOF and 90%safe OT Short Term Goal 5 (Week 1): The pt will tolerate greater than 25 mins of activity at 90% safe with 2-3 rest breaks.  Skilled Therapeutic Interventions/Progress Updates:    S: Pt reports that he washed up really well yesterday morning and doesn't feel like he needs to do it again this morning.   O: - UB dressing: While seated on EOB, patient completed UB dressing; removing paper scrub top with new scrub top; with set-up.  - Functional transfer: Bed to w/c completed with RW at [place].  - Standing tolerance/balance: Played UNO game while standing using RW for balance. Contact guard assist provided for balance. Patient able to maintain balance while using 1 extremity to support or at times no upper extremity support. Two standing trials completed; each last 3'30".   A: Patient demonstrating and verbalizing improvement in functional performance this date. Skilled OT session focused on increasing activity tolerance while standing. Pt able to completed two trials for the same amount of time. Did mention back pain increasing with standing time. Pt able to verbalize when seated rest breaks were needed.    P: Continue to work on increasing activity tolerance while standing to improve overall performance during ADL tasks. Take  shower when provided a 29' or greater time.   Therapy Documentation Precautions:  Precautions Precautions: Fall Precaution Comments: monitor O2 Restrictions Weight Bearing Restrictions: No  Pain: Pain Assessment Pain Scale: 0-10 Pain Score: 0-No pain   Therapy/Group: Individual Therapy  Ailene Ravel, OTR/L,CBIS  Supplemental OT - [facility]  10/03/2021, 12:53 PM

## 2021-10-03 NOTE — Progress Notes (Signed)
Patient ID: Ricky Hayden, male   DOB: 05-20-1945, 76 y.o.   MRN: [id number]  Sent H & P and therapy evaluations to [facility] per her request.

## 2021-10-03 NOTE — Progress Notes (Addendum)
PROGRESS NOTE   Subjective/Complaints: Increased urinary incontinence reported. No additional concerns.  Review of Systems  Constitutional:  Negative for chills and fever.  Respiratory:  Negative for cough and shortness of breath.   Cardiovascular:  Negative for chest pain and palpitations.  Gastrointestinal:  Negative for abdominal pain, nausea and vomiting.     Objective:   No results found. Recent Labs    10/01/21 0712 10/02/21 0535  WBC 5.0 4.7  HGB 10.2* 9.8*  HCT 32.5* 29.6*  PLT 113* 107*   Recent Labs    10/01/21 1709 10/02/21 0535  NA 138 141  K 5.2* 4.2  CL 102 105  CO2 31 32  GLUCOSE 110* 86  BUN 13 14  CREATININE 1.14 1.25*  CALCIUM 9.4 9.4    Intake/Output Summary (Last 24 hours) at 10/03/2021 0743 Last data filed at 10/02/2021 1749 Gross per 24 hour  Intake 840 ml  Output 300 ml  Net 540 ml          Physical Exam: Vital Signs Blood pressure 139/76, pulse 63, temperature 98.2 F (36.8 C), resp. rate 14, weight 135 kg, SpO2 94 %.    Assessment/Plan: 1. Functional deficits which require 3+ hours per day of interdisciplinary therapy in a comprehensive inpatient rehab setting. Physiatrist is providing close team supervision and 24 hour management of active medical problems listed below. Physiatrist and rehab team continue to assess barriers to discharge/monitor patient progress toward functional and medical goals  Care Tool:  Bathing    Body parts bathed by patient: Right arm, Left arm   Body parts bathed by helper: Buttocks     Bathing assist Assist Level: Minimal Assistance - Patient > 75%     Upper Body Dressing/Undressing Upper body dressing   What is the patient wearing?: Pull over shirt    Upper body assist Assist Level: Set up assist    Lower Body Dressing/Undressing Lower body dressing      What is the patient wearing?: Incontinence brief     Lower body  assist Assist for lower body dressing: Maximal Assistance - Patient 25 - 49%     Toileting Toileting    Toileting assist Assist for toileting: Maximal Assistance - Patient 25 - 49%     Transfers Chair/bed transfer  Transfers assist  Chair/bed transfer activity did not occur: Safety/medical concerns  Chair/bed transfer assist level: Minimal Assistance - Patient > 75%     Locomotion Ambulation   Ambulation assist   Ambulation activity did not occur: Safety/medical concerns (fatigue, weakness, pain)          Walk 10 feet activity   Assist  Walk 10 feet activity did not occur: Safety/medical concerns (fatigue, weakness, pain)        Walk 50 feet activity   Assist Walk 50 feet with 2 turns activity did not occur: Safety/medical concerns (fatigue, weakness, pain)         Walk 150 feet activity   Assist Walk 150 feet activity did not occur: Safety/medical concerns (fatigue, weakness, pain)         Walk 10 feet on uneven surface  activity   Assist Walk 10 feet on uneven  surfaces activity did not occur: Safety/medical concerns (fatigue, weakness, pain)         Wheelchair     Assist Is the patient using a wheelchair?: Yes Type of Wheelchair: Manual    Wheelchair assist level: Supervision/Verbal cueing Max wheelchair distance: 54ft    Wheelchair 50 feet with 2 turns activity    Assist        Assist Level: Supervision/Verbal cueing   Wheelchair 150 feet activity     Assist      Assist Level: Dependent - Patient 0%   Blood pressure 139/76, pulse 63, temperature 98.2 F (36.8 C), resp. rate 14, weight 135 kg, SpO2 94 %.  General: Alert and oriented to self, Ricky Hayden, location, Reported year as 2032 and gave June 1 as the date (its may 31) No apparent distress. In bed appears comfortable HEENT: Healed crani incision, PERRLA, EOMI, sclera anicteric, oral mucosa pink and moist, Neck: Supple without JVD or lymphadenopathy Heart:  Reg rate and rhythm.  Chest: CTA bilaterally without wheezes, rales, or rhonchi; no distress, on RA Abdomen: Soft, non-tender, non-distended, bowel sounds positive. Extremities: No clubbing, cyanosis, or edema. Pulses are 2+ Psych: Pt's affect is appropriate. Pt is cooperative Skin: Clean and intact without signs of breakdown Neuro:  Follows commands, Answer questions, able to repeat 3 words, makes eye contact. Sensation intact in all 4 extremities although reports altered sensation digits 4+5 both hands, tinels neg at elbow and wrist, intrensic hand muscles atrophy noted b/l Musculoskeletal: No joint swelling or tenderness 5/5 b/l UE strength 5/5 in b/l LE with exception of Hip flexion 4/5 bilaterally Normal bulk and tone L PIV Urinary incontinence    Medical Problem List and Plan: 1. Functional deficits secondary to Debility due to septic shock              -patient may shower             -ELOS/Goals: 10-14 days, mod I PT/OT/SLP  -Continue CIR, PT,OT,SLP 2.  Antithrombotics: -DVT/anticoagulation:  Pharmaceutical: Coumadin and Lovenox             -antiplatelet therapy: NA 3. Pain Management:  Tylenol prn.  4. Mood: LCSW to follow for evaluation and support.              -antipsychotic agents: N/A 5. Neuropsych: This patient is capable of making decisions on his own behalf. 6. Skin/Wound Care: Routine pressure relief measures.  7. Fluids/Electrolytes/Nutrition:  Monitor I/O. Check CMET in am. 8. CAD/ICM s/p BiV: Monitor weights daily and for symptoms with increase in activity.             --continue Coreg, Jardiance, pravastatin and Entresto.              --low salt HH diet.              -Denies chest pain  -Cardiology signed off 6/1, appreciate assistance  -Denies chest pain 9. A fib w/RVR: HR controlled on amiodarone and Coreg.             --monitor for symptoms with increase in activity              -Denies palpitations  -Continue coumadin, pharmacy following 10 .  Pre-diabetes: Hgb A1C- 5.7. Monitor BS ac/hs--should improve with d/c of prednisone today             --continue insulin glargline but wean to off as BS improve.   -6/1Glucose well controlled this AM, Will decrease to 5  units  -6/2 Decrease insulin to 3 units 11. CKD III: Avoid nephrotoxic meds. Recheck CMET in am.  -Cr 1.18 5/31 -6/1 Cr 1.25, stable overall 12. BPH: Will monitor voiding with bladder scan.  13. 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   14. COPD: Pulmonary hygiene. Completes prednisone 40 mg X 5 days to day. --Continue Incruse daily.  -Continue O2 [place] -Denies shortness of breath 15. Constipation             -Increase miralax to BID  -Will add sorbitol, Dulcolax supp prn  -BM today, continue to follow 16. Urinary Incontinence, reports going of since initial admission  -U/A ordered, +wbc bacteria, Will order keflex for UTI    LOS: 2 days A FACE TO FACE EVALUATION WAS PERFORMED  Jennye Boroughs 10/03/2021, 7:43 AM

## 2021-10-03 NOTE — Progress Notes (Signed)
Occupational Therapy Session Note  Patient Details  Name: Ricky Hayden MRN: [id number] Date of Birth: 11-26-1945  Today's Date: 10/03/2021 OT Individual Time: 1015-1100 OT Individual Time Calculation (min): 45 min    Short Term Goals: Week 1:  OT Short Term Goal 1 (Week 1): Pt will complete LB ADL at EOB  with min A for balance using AE OT Short Term Goal 2 (Week 1): Pt will complete stand pivot transfer from EOB to [facility] with MinA  at 90%safe OT Short Term Goal 3 (Week 1): Pt will complete grooming activity at sink at w/c LOF with s/u only at 90% safe OT Short Term Goal 4 (Week 1): Pt will don pants with ModA using AE for addiitonal reach at chair LOF and 90%safe OT Short Term Goal 5 (Week 1): The pt will tolerate greater than 25 mins of activity at 90% safe with 2-3 rest breaks.  Skilled Therapeutic Interventions/Progress Updates:    Pt received in wc ready for therapy.  Pt worked on functional mobility of sit to stands, standing balance and endurance and ambulation in and out of the bathroom all with light CGA.  In standing, pt worked on Probation officer (pants S and brief mod A) and washing his hands, demonstrating good standing balance and endurance of 2-3 minutes.   His son arrived. Discussed that pt is improving his independence but the first 1-2 weeks at home he should have intermittent supervision for his safety as pt has been hospitalized for several weeks. Pt has made excellent progress this week since last seen on inpt rehab.  Pt in wc with seat alarm on and all needs met.   Therapy Documentation Precautions:  Precautions Precautions: Fall Precaution Comments: monitor O2 Restrictions Weight Bearing Restrictions: No    Vital Signs: Oxygen Therapy SpO2: 94 % O2 Device: Room Air Pain:  No c/o pain     Therapy/Group: Individual Therapy  Ricky Hayden 10/03/2021, 8:28 AM

## 2021-10-03 NOTE — Progress Notes (Signed)
Received call from [place], bedside nurse about patient's back and ankle pain.  Placed order for tramadol 50 mg q6hr prn for moderate pain.  Also place Aquathermia heating pad order.

## 2021-10-03 NOTE — Progress Notes (Signed)
Speech Language Pathology Daily Session Note  Patient Details  Name: Ricky Hayden MRN: [id number] Date of Birth: 04-13-1946  Today's Date: 10/03/2021 SLP Individual Time: 0800-0900 SLP Individual Time Calculation (min): 60 min  Short Term Goals: Week 1: SLP Short Term Goal 1 (Week 1): Pt will demonstrate selective attention during functional tasks with supervision A verbal cues for redirection in 30-45 minute intervals. SLP Short Term Goal 2 (Week 1): Pt will demonstrated recall of daily, novel information with use of min A verbla cues for external/internal aids. SLP Short Term Goal 3 (Week 1): Pt will demonstrate functional basic-mildly complex problem solving skills with min A verbal cues. SLP Short Term Goal 4 (Week 1): Pt will self-monitor and self-correct functional errors with min A verbal cues. SLP Short Term Goal 5 (Week 1): Pt will tolerate regular textures and thin liquids with supervision A verbal cues for use of swallow stratgeies.  Skilled Therapeutic Interventions: Pt seen for skilled ST with focus on cognitive goals, pt sitting EOB upon entry and requesting assistance to put pants on. SLP assisting patient to thread pants, stand with walker and pull over hips with Supervision A for safety and problem solving. Pt participating in simple money management task benefiting from occasional repetition of prompts for 90% accuracy. Pt able to self-monitor errors with min A cues and self-correct with Supervision A cues. Pt stating "I don't know why this is boggling my mind", education on recent medical history and cognitive impacts. Pt demonstrating selective attention during therapeutic tasks for entirety of ST session. Pt left in bed with all needs within reach receiving a phone call. Cont ST POC.  Pain Pain Assessment Pain Scale: 0-10 Pain Score: 0-No pain  Therapy/Group: Individual Therapy  [name] 10/03/2021, 9:01 AM

## 2021-10-04 DIAGNOSIS — J439 Emphysema, unspecified: Secondary | ICD-10-CM

## 2021-10-04 LAB — GLUCOSE, CAPILLARY
Glucose-Capillary: 90 mg/dL (ref 70–99)
Glucose-Capillary: 87 mg/dL (ref 70–99)
Glucose-Capillary: 93 mg/dL (ref 70–99)
Glucose-Capillary: 119 mg/dL — ABNORMAL HIGH (ref 70–99)

## 2021-10-04 LAB — PROTIME-INR
INR: 2.2 — ABNORMAL HIGH (ref 0.8–1.2)
Prothrombin Time: 24.2 seconds — ABNORMAL HIGH (ref 11.4–15.2)

## 2021-10-04 NOTE — Progress Notes (Signed)
[place] for enoxaparin> warfarin Indication: atrial fibrillation  Allergies  Allergen Reactions   Calcium Channel Blockers Other (See Comments)    Came to hospital in 1995-caused chest pain     Patient Measurements: Weight: 135 kg (297 lb 9.9 oz)  Vital Signs: Temp: 98.7 F (37.1 C) (06/03 0516) Temp Source: Oral (06/03 0516) BP: 101/57 (06/03 0516) Pulse Rate: 67 (06/03 0516)  Labs: Recent Labs    10/01/21 1709 10/02/21 0535 10/03/21 0544 10/04/21 0622  HGB  --  9.8*  --   --   HCT  --  29.6*  --   --   PLT  --  107*  --   --   LABPROT  --  16.5* 16.7* 24.2*  INR  --  1.3* 1.4* 2.2*  CREATININE 1.14 1.25*  --   --      Estimated Creatinine Clearance: 65.6 mL/min (A) (by C-G formula based on SCr of 1.25 mg/dL (H)).  Assessment: 76 year old male with new PAF. Patient recently had TEE/DCCV on 5/26. Patient started on enoxaparin and warfarin bridge on 5/30. Due to interaction with phenobarbital ([place] anticoagulation/drug interaction document), DOACs should not be used. Interaction discussed with cardiology on 5/31. Cardiology recommending to continue with warfarin therapy.   INR this AM 2.2 and within therapeutic goals. Today would be day 5 of enoxaparin bridge. If INR stable and within goal tomorrow, can consider discontinuing enoxaparin then. Last Hgb and platelets stable. No signs of bleeding noted.   Goal of Therapy:  INR 2-3 Heparin level 0.6-1.0 units/ml Monitor platelets by anticoagulation protocol: Yes   Plan:  Lovenox 130mg  q12 hours Warfarin 7.5mg  daily for now Daily INR and CBC every 72 hours while on warfarin  Thank you for involving pharmacy in this patient's care.  Elita Quick, PharmD PGY1 Ambulatory Care Pharmacy Resident 10/04/2021 10:07 AM  **Pharmacist phone directory can be found on [email or web address] listed under [place]**

## 2021-10-04 NOTE — IPOC Note (Signed)
Overall Plan of Care [facility]) Patient Details Name: Ricky Hayden MRN: [id number] DOB: 1946-01-23  Admitting Diagnosis: [place] Problems: Principal Problem:   Debility     Functional Problem List: Nursing Bladder, Edema, Endurance, Pain  PT Balance, Endurance, Motor, Nutrition, Pain  OT Balance, Safety, Sensory, Endurance, Motor  SLP    TR         Basic ADL's: OT Dressing, Bathing, Toileting     Advanced  ADL's: OT Simple Meal Preparation, Light Housekeeping     Transfers: PT Bed Mobility, Bed to Chair, Car, Manufacturing systems engineer, Metallurgist: PT Ambulation, Emergency planning/management officer, Stairs     Additional Impairments: OT    SLP Swallowing, Social Cognition   Problem Solving, Memory, Awareness, Attention  TR      Anticipated Outcomes Item Anticipated Outcome  Self Feeding set-up  Swallowing  Mod I   Basic self-care  ModI to [facility]  ModI to [employer] Transfers ModI to MinA  Bowel/Bladder  continent of bowel/bladder with min assist  Transfers  supervision with LRAD  Locomotion  supervision with LRAD  Communication     Cognition  Mod I - Supervision A  Pain  pain less than or equal to 4/10 with min assist  Safety/Judgment  free from falls/injury and displaying appropriate safety decisions w   Therapy Plan: PT Intensity: Minimum of 1-2 x/day ,45 to 90 minutes PT Frequency: 5 out of 7 days PT Duration Estimated Length of Stay: 10-12 days OT Intensity: Minimum of 1-2 x/day, 45 to 90 minutes OT Frequency: 5 out of 7 days OT Duration/Estimated Length of Stay: 12-14 days SLP Intensity: Minumum of 1-2 x/day, 30 to 90 minutes SLP Frequency: 3 to 5 out of 7 days SLP Duration/Estimated Length of Stay: 10-14 days   Team Interventions: Nursing Interventions Patient/Family Education  PT interventions Ambulation/gait training, Training and development officer, Discharge planning, Community reintegration, Dispensing optician, Neuromuscular re-education, Functional mobility training, Patient/family education, Stair training, Splinting/orthotics, Therapeutic Activities, Therapeutic Exercise, UE/LE Strength taining/ROM, UE/LE Coordination activities, Visual/perceptual remediation/compensation, Wheelchair propulsion/positioning, Disease management/prevention, Skin care/wound management, Cognitive remediation/compensation, Pain management  OT Interventions UE/LE Strength taining/ROM, Therapeutic Exercise, Self Care/advanced ADL retraining, Neuromuscular re-education, UE/LE Coordination activities, Therapeutic Activities, DME/adaptive equipment instruction, Patient/family education, Training and development officer  SLP Interventions Cognitive remediation/compensation, Environmental controls, Internal/external aids, Patient/family education, Functional tasks, Dysphagia/aspiration precaution training, Cueing hierarchy  TR Interventions    SW/CM Interventions     Barriers to Discharge MD  Medical stability  Nursing Decreased caregiver support, Home environment access/layout 1 level 3 ste , no rails w daughter; niece will assist  PT Inaccessible home environment, Decreased caregiver support, Home environment access/layout, Other (comments) pain, lives alone and children can provide intermittent assist, generalized weakness/deconditioning  OT  (patient needs a shower chair that extends outside of the shower for greater ease and independence using the walker)    SLP      SW       Team Discharge Planning: Destination: PT-Home ,OT- Home , SLP-Home Projected Follow-up: PT-Home health PT, OT-  Home health OT, 24 hour supervision/assistance, SLP-Home Health SLP, Outpatient SLP Projected Equipment Needs: PT-To be determined, OT- Rolling walker with 5" wheels, 3 in 1 bedside comode, Tub/shower bench, SLP-None recommended by SLP Equipment Details: PT-has rollator, OT-  Patient/family involved in discharge planning: PT-  Patient,  OT-Patient, SLP-Patient  MD ELOS: 10-14 days Medical Rehab Prognosis:  Excellent Assessment: The patient has been admitted for CIR therapies  with the diagnosis of debility. The team will be addressing functional mobility, strength, stamina, balance, safety, adaptive techniques and equipment, self-care, bowel and bladder mgt, patient and caregiver education. Goals have been set at [place]. Anticipated discharge destination is home.        See Team Conference Notes for weekly updates to the plan of care

## 2021-10-04 NOTE — Progress Notes (Signed)
Occupational Therapy Session Note  Patient Details  Name: Ricky Hayden MRN: [id number] Date of Birth: 01-Feb-1946  Today's Date: 10/04/2021 OT Individual Time: 1050-1207 OT Individual Time Calculation (min): 77 min    Short Term Goals: Week 1:  OT Short Term Goal 1 (Week 1): Pt will complete LB ADL at EOB  with min A for balance using AE OT Short Term Goal 2 (Week 1): Pt will complete stand pivot transfer from EOB to [facility] with MinA  at 90%safe OT Short Term Goal 3 (Week 1): Pt will complete grooming activity at sink at w/c LOF with s/u only at 90% safe OT Short Term Goal 4 (Week 1): Pt will don pants with ModA using AE for addiitonal reach at chair LOF and 90%safe OT Short Term Goal 5 (Week 1): The pt will tolerate greater than 25 mins of activity at 90% safe with 2-3 rest breaks.  Skilled Therapeutic Interventions/Progress Updates:    Pt received in w/c with son in room. Pt asking about a shower.  Set up bench in shower and then pt stood to ambulate but then needed to urinate right away and began leaking through brief.  He was able to finish walking to bathroom to sit on toilet, but had already urinated in his brief.  From toilet seat, pt doffed clothing and then moved to shower.  SEE ADL DOCUMENTATION BELOW FOR DETAILS. Pt has much improved endurance and balance.  BP 111/60.  Pt then sat at sink to complete oral care. Sitting in wc he participated in UB strengthening using 3# dowel bar for sh raises, rows with extra resistance with red med theraband and bicep curls with dowel and band for 12 reps each exercise, 3 sets.   Pt resting in w.c with pad alarm on and all needs met.   Therapy Documentation Precautions:  Precautions Precautions: Fall Precaution Comments: monitor O2 Restrictions Weight Bearing Restrictions: No    Vital Signs: Therapy Vitals Temp: 98.4 F (36.9 C) Temp Source: Oral Pulse Rate: 63 Resp: 18 BP: 109/63 Patient Position (if appropriate): Lying Oxygen  Therapy SpO2: 99 % O2 Device: Room Air Pain: Pain Assessment Pain Score: 0-No pain  ADL: ADL Eating: Set up Where Assessed-Eating: Bed level Grooming: Setup Where Assessed-Grooming: Edge of bed Upper Body Bathing: Setup Where Assessed-Upper Body Bathing: Shower Lower Body Bathing: Contact guard Where Assessed-Lower Body Bathing: Shower Upper Body Dressing: Independent Where Assessed-Upper Body Dressing: Wheelchair Lower Body Dressing: Moderate assistance Where Assessed-Lower Body Dressing: Wheelchair Toileting: Moderate assistance Where Assessed-Toileting: Glass blower/designer: Therapist, music Method: Counselling psychologist: Energy manager: Maximal assistance (based on observation) Tub/Shower Transfer Method:  (based on observation) Social research officer, government: Curator Method: Heritage manager: Grab bars, Transfer tub bench   Therapy/Group: Individual Therapy  [place] 10/04/2021, 4:26 PM

## 2021-10-04 NOTE — Progress Notes (Signed)
PROGRESS NOTE   Subjective/Complaints: Therapy with Pamala Hurry went very well today Discussed adding tramadol prn- he has not noted any nausea or increased weakness since starting  Review of Systems  Constitutional:  Negative for chills and fever.  Respiratory:  Negative for cough and shortness of breath.   Cardiovascular:  Negative for chest pain and palpitations.  Gastrointestinal:  Negative for abdominal pain, nausea and vomiting.  +low back pain   Objective:   No results found. Recent Labs    10/02/21 0535  WBC 4.7  HGB 9.8*  HCT 29.6*  PLT 107*    Recent Labs    10/01/21 1709 10/02/21 0535  NA 138 141  K 5.2* 4.2  CL 102 105  CO2 31 32  GLUCOSE 110* 86  BUN 13 14  CREATININE 1.14 1.25*  CALCIUM 9.4 9.4     Intake/Output Summary (Last 24 hours) at 10/04/2021 1642 Last data filed at 10/04/2021 0143 Gross per 24 hour  Intake 440 ml  Output 600 ml  Net -160 ml          Physical Exam: Vital Signs Blood pressure 109/63, pulse 63, temperature 98.4 F (36.9 C), temperature source Oral, resp. rate 18, weight 135 kg, SpO2 99 %.  General: Alert and oriented to self, Ricky Hayden, location, Reported year as 2032 and gave June 1 as the date (its may 31) No apparent distress. In bed appears comfortable, BMI 46.61 HEENT: Healed crani incision, PERRLA, EOMI, sclera anicteric, oral mucosa pink and moist, Neck: Supple without JVD or lymphadenopathy Heart: Reg rate and rhythm.  Chest: CTA bilaterally without wheezes, rales, or rhonchi; no distress, on RA Abdomen: Soft, non-tender, non-distended, bowel sounds positive. Extremities: No clubbing, cyanosis, or edema. Pulses are 2+ Psych: Pt's affect is appropriate. Pt is cooperative Skin: Clean and intact without signs of breakdown Neuro:  Follows commands, Answer questions, able to repeat 3 words, makes eye contact. Sensation intact in all 4 extremities although reports  altered sensation digits 4+5 both hands, tinels neg at elbow and wrist, intrensic hand muscles atrophy noted b/l Musculoskeletal: No joint swelling or tenderness 5/5 b/l UE strength 5/5 in b/l LE with exception of Hip flexion 4/5 bilaterally Normal bulk and tone L PIV Urinary incontinence   Assessment/Plan: 1. Functional deficits which require 3+ hours per day of interdisciplinary therapy in a comprehensive inpatient rehab setting. Physiatrist is providing close team supervision and 24 hour management of active medical problems listed below. Physiatrist and rehab team continue to assess barriers to discharge/monitor patient progress toward functional and medical goals  Care Tool:  Bathing    Body parts bathed by patient: Right arm, Left arm, Chest, Abdomen, Front perineal area, Buttocks, Right upper leg, Left upper leg, Right lower leg, Left lower leg, Face   Body parts bathed by helper: Buttocks     Bathing assist Assist Level: Contact Guard/Touching assist     Upper Body Dressing/Undressing Upper body dressing   What is the patient wearing?: Pull over shirt    Upper body assist Assist Level: Independent    Lower Body Dressing/Undressing Lower body dressing      What is the patient wearing?: Incontinence brief, Pants  Lower body assist Assist for lower body dressing: Moderate Assistance - Patient 50 - 74%     Toileting Toileting    Toileting assist Assist for toileting: Moderate Assistance - Patient 50 - 74%     Transfers Chair/bed transfer  Transfers assist  Chair/bed transfer activity did not occur: Safety/medical concerns  Chair/bed transfer assist level: Moderate Assistance - Patient 50 - 74%     Locomotion Ambulation   Ambulation assist   Ambulation activity did not occur: Safety/medical concerns (fatigue, weakness, pain)          Walk 10 feet activity   Assist  Walk 10 feet activity did not occur: Safety/medical concerns (fatigue,  weakness, pain)        Walk 50 feet activity   Assist Walk 50 feet with 2 turns activity did not occur: Safety/medical concerns (fatigue, weakness, pain)         Walk 150 feet activity   Assist Walk 150 feet activity did not occur: Safety/medical concerns (fatigue, weakness, pain)         Walk 10 feet on uneven surface  activity   Assist Walk 10 feet on uneven surfaces activity did not occur: Safety/medical concerns (fatigue, weakness, pain)         Wheelchair     Assist Is the patient using a wheelchair?: Yes Type of Wheelchair: Manual    Wheelchair assist level: Supervision/Verbal cueing Max wheelchair distance: 26ft    Wheelchair 50 feet with 2 turns activity    Assist        Assist Level: Supervision/Verbal cueing   Wheelchair 150 feet activity     Assist      Assist Level: Dependent - Patient 0%   Blood pressure 109/63, pulse 63, temperature 98.4 F (36.9 C), temperature source Oral, resp. rate 18, weight 135 kg, SpO2 99 %.     Medical Problem List and Plan: 1. Functional deficits secondary to Debility due to septic shock              -patient may shower             -ELOS/Goals: 10-14 days, mod I PT/OT/SLP  -Continue CIR, PT,OT,SLP 2.  Antithrombotics: -DVT/anticoagulation:  Pharmaceutical: Coumadin and Lovenox             -antiplatelet therapy: NA 3. Chronic low back pain:  Tylenol prn. Tramadol added prn 4. Mood: LCSW to follow for evaluation and support.              -antipsychotic agents: N/A 5. Neuropsych: This patient is capable of making decisions on his own behalf. 6. Skin/Wound Care: Routine pressure relief measures.  7. Fluids/Electrolytes/Nutrition:  Monitor I/O. Check CMET in am. 8. CAD/ICM s/p BiV: Monitor weights daily and for symptoms with increase in activity.             --continue Coreg, Jardiance, pravastatin and Entresto.              --low salt HH diet.              -Denies chest pain  -Cardiology  signed off 6/1, appreciate assistance  -Denies chest pain 9. A fib w/RVR: HR controlled on amiodarone and Coreg.             --monitor for symptoms with increase in activity              -Denies palpitations  -Continue coumadin, pharmacy following 10 . Pre-diabetes: Hgb A1C- 5.7. Monitor BS ac/hs--should improve  with d/c of prednisone today             --continue insulin glargline but wean to off as BS improve.   -6/1Glucose well controlled this AM, Will decrease to 5 units  -6/2 Decrease insulin to 3 units 11. CKD III: Avoid nephrotoxic meds. Recheck CMET in am.  -Cr 1.18 5/31 -6/1 Cr 1.25, stable overall 12. BPH: Will monitor voiding with bladder scan.  13. 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   14. COPD: Pulmonary hygiene. Completes prednisone 40 mg X 5 days to day. --Continue Incruse daily.  -Continue O2 [place] if needed, currently off -Denies shortness of breath 15. Constipation             -Increase miralax to BID  -Continue sorbitol, Dulcolax supp prn 16. Urinary Incontinence, reports going of since initial admission  -U/A ordered, +wbc bacteria, Will order keflex for UTI. UC reviewed and shows 20,000 colonies of Proteus.     LOS: 3 days A FACE TO FACE EVALUATION WAS PERFORMED  Clide Deutscher Izak Anding 10/04/2021, 4:42 PM

## 2021-10-04 NOTE — Progress Notes (Signed)
Physical Therapy Session Note  Patient Details  Name: Ricky Hayden MRN: [id number] Date of Birth: 1946-04-22  Today's Date: 10/04/2021 PT Individual Time: 1435-1530 PT Individual Time Calculation (min): 55 min   Short Term Goals: Week 1:  PT Short Term Goal 1 (Week 1): pt will transfer sit<>stand with LRAD and minA PT Short Term Goal 2 (Week 1): pt will transfer bed<>chair with LRAD and CGA PT Short Term Goal 3 (Week 1): pt will ambulate 71ft with LRAD and min A  Skilled Therapeutic Interventions/Progress Updates:    Pt initially seated on edge of bed w/nurse at side.  stand pivot transfer to wc w/cga.  Wc propulsion 65ft w/supervision. Gait: 11ft x 2 w/Rw and cga, HR 60-66, 02 sats 99%  Ambulatory transfer to Nustep w/cues, mod assist for LE positioing in pedals.  NuStep L2 x 5 min HR 66, 02 sats 100, continued x 3 min 02 sats 98%, HR 64  Wc propulsion as above. Seated therex: adducter squeeze x 10, heel raises x 15  Pt requires several min seated rest between ativities due to SOB, HR and 02 sats stable.    Therapy Documentation Precautions:  Precautions Precautions: Fall Precaution Comments: monitor O2 Restrictions Weight Bearing Restrictions: No    Therapy/Group: Individual Therapy Callie Fielding, [place] 10/04/2021, 3:36 PM

## 2021-10-04 NOTE — Progress Notes (Signed)
Speech Language Pathology Daily Session Note  Patient Details  Name: Mcdaniel Ohms MRN: [id number] Date of Birth: 29-Jun-1945  Today's Date: 10/04/2021 SLP Individual Time: 0735-0830 SLP Individual Time Calculation (min): 55 min  Short Term Goals: Week 1: SLP Short Term Goal 1 (Week 1): Pt will demonstrate selective attention during functional tasks with supervision A verbal cues for redirection in 30-45 minute intervals. SLP Short Term Goal 2 (Week 1): Pt will demonstrated recall of daily, novel information with use of min A verbla cues for external/internal aids. SLP Short Term Goal 3 (Week 1): Pt will demonstrate functional basic-mildly complex problem solving skills with min A verbal cues. SLP Short Term Goal 4 (Week 1): Pt will self-monitor and self-correct functional errors with min A verbal cues. SLP Short Term Goal 5 (Week 1): Pt will tolerate regular textures and thin liquids with supervision A verbal cues for use of swallow stratgeies.  Skilled Therapeutic Interventions:Skilled ST services focused on cognitive skills. SLP facilitated mildly complex problem solving, recall and selective attention in account balancing task. Pt initially required max A verbal cues for calculation steps due to reduced attention/recall, fading 1/2 way through task to min A verbal cues. Pt required supervision A verbal cues for problem solving calculations and mod A fade to min A verbal cues for error awareness. SLP also facilitated problem solving, recall and attention in novel card task Blink. Pt required supervision A verbal cues fade to mod I when played at basic level and supervision A verbal cues when played at mildly complex level. Pt was left in room with call bell within reach and chair alarm set. SLP recommends to continue skilled services.     Pain Pain Assessment Pain Scale: 0-10 Pain Score: 0-No pain Pain Type: Acute pain  Therapy/Group: Individual Therapy  Makenly [place]  [facility] 10/04/2021, 12:50  PM

## 2021-10-05 LAB — GLUCOSE, CAPILLARY
Glucose-Capillary: 86 mg/dL (ref 70–99)
Glucose-Capillary: 114 mg/dL — ABNORMAL HIGH (ref 70–99)
Glucose-Capillary: 105 mg/dL — ABNORMAL HIGH (ref 70–99)
Glucose-Capillary: 107 mg/dL — ABNORMAL HIGH (ref 70–99)

## 2021-10-05 LAB — PROTIME-INR
INR: 2.9 — ABNORMAL HIGH (ref 0.8–1.2)
Prothrombin Time: 30.5 seconds — ABNORMAL HIGH (ref 11.4–15.2)

## 2021-10-05 LAB — URINE CULTURE
Culture: 20000 — AB
Special Requests: NORMAL

## 2021-10-05 MED ORDER — WARFARIN SODIUM 2.5 MG PO TABS
2.5000 mg | ORAL_TABLET | Freq: Once | ORAL | Status: AC
Start: 2021-10-05 — End: 2021-10-05
  Administered 2021-10-05: 2.5 mg via ORAL
  Filled 2021-10-05: qty 1

## 2021-10-05 NOTE — Progress Notes (Signed)
[place] for enoxaparin> warfarin Indication: atrial fibrillation  Allergies  Allergen Reactions   Calcium Channel Blockers Other (See Comments)    Came to hospital in 1995-caused chest pain     Patient Measurements: Weight: 135 kg (297 lb 9.9 oz)  Vital Signs: Temp: 98.4 F (36.9 C) (06/04 0324) Temp Source: Oral (06/04 0324) BP: 122/74 (06/04 0324) Pulse Rate: 62 (06/04 0324)  Labs: Recent Labs    10/03/21 0544 10/04/21 0622 10/05/21 0621  LABPROT 16.7* 24.2* 30.5*  INR 1.4* 2.2* 2.9*     Estimated Creatinine Clearance: 65.6 mL/min (A) (by C-G formula based on SCr of 1.25 mg/dL (H)).  Assessment: 76 year old male with new PAF. Patient recently had TEE/DCCV on 5/26. Patient started on enoxaparin and warfarin bridge on 5/30. Due to interaction with phenobarbital ([place] anticoagulation/drug interaction document), DOACs should not be used. Interaction discussed with cardiology on 5/31. Cardiology recommending to continue with warfarin therapy.   INR this AM 2.9 and within therapeutic goals, though on higher end. Will lower dose today to keep from going too supratherapeutic in a couple of days. If INR stable and within goal tomorrow, can consider discontinuing enoxaparin. Last Hgb and platelets stable. No signs of bleeding noted.   Goal of Therapy:  INR 2-3 Heparin level 0.6-1.0 units/ml Monitor platelets by anticoagulation protocol: Yes   Plan:  Lovenox 130mg  q12 hours Warfarin 2.5 mg PO x 1 Daily INR and CBC every 72 hours while on warfarin  Thank you for involving pharmacy in this patient's care.  Elita Quick, PharmD PGY1 Ambulatory Care Pharmacy Resident 10/05/2021 12:10 PM  **Pharmacist phone directory can be found on [email or web address] listed under [address]**

## 2021-10-05 NOTE — Progress Notes (Signed)
PROGRESS NOTE   Subjective/Complaints: No new complaints Interested in [place] for back pain, has one in room  Review of Systems  Constitutional:  Negative for chills and fever.  Respiratory:  Negative for cough and shortness of breath.   Cardiovascular:  Negative for chest pain and palpitations.  Gastrointestinal:  Negative for abdominal pain, nausea and vomiting.  +low back pain- stable   Objective:   No results found. No results for input(s): WBC, HGB, HCT, PLT in the last 72 hours.  No results for input(s): NA, K, CL, CO2, GLUCOSE, BUN, CREATININE, CALCIUM in the last 72 hours.   Intake/Output Summary (Last 24 hours) at 10/05/2021 1445 Last data filed at 10/05/2021 1323 Gross per 24 hour  Intake 600 ml  Output --  Net 600 ml          Physical Exam: Vital Signs Blood pressure 108/68, pulse 66, temperature 97.8 F (36.6 C), resp. rate 16, weight 135 kg, SpO2 99 %.  General: Alert and oriented to self, No apparent distress. In bed appears comfortable, BMI 46.61 HEENT: Healed crani incision, PERRLA, EOMI, sclera anicteric, oral mucosa pink and moist, Neck: Supple without JVD or lymphadenopathy Heart: Reg rate and rhythm.  Chest: CTA bilaterally without wheezes, rales, or rhonchi; no distress, on RA, deconditioned, requires rest between activities Abdomen: Soft, non-tender, non-distended, bowel sounds positive. Extremities: No clubbing, cyanosis, or edema. Pulses are 2+ Psych: Pt's affect is appropriate. Pt is cooperative Skin: Clean and intact without signs of breakdown Neuro:  Follows commands, Answer questions, able to repeat 3 words, makes eye contact. Sensation intact in all 4 extremities although reports altered sensation digits 4+5 both hands, tinels neg at elbow and wrist, intrensic hand muscles atrophy noted b/l Musculoskeletal: No joint swelling or tenderness 5/5 b/l UE strength 5/5 in b/l LE with exception  of Hip flexion 4/5 bilaterally Normal bulk and tone L PIV Urinary incontinence   Assessment/Plan: 1. Functional deficits which require 3+ hours per day of interdisciplinary therapy in a comprehensive inpatient rehab setting. Physiatrist is providing close team supervision and 24 hour management of active medical problems listed below. Physiatrist and rehab team continue to assess barriers to discharge/monitor patient progress toward functional and medical goals  Care Tool:  Bathing    Body parts bathed by patient: Right arm, Left arm, Chest, Abdomen, Front perineal area, Buttocks, Right upper leg, Left upper leg, Right lower leg, Left lower leg, Face   Body parts bathed by helper: Buttocks     Bathing assist Assist Level: Contact Guard/Touching assist     Upper Body Dressing/Undressing Upper body dressing   What is the patient wearing?: Pull over shirt    Upper body assist Assist Level: Independent    Lower Body Dressing/Undressing Lower body dressing      What is the patient wearing?: Incontinence brief, Pants     Lower body assist Assist for lower body dressing: Moderate Assistance - Patient 50 - 74%     Toileting Toileting    Toileting assist Assist for toileting: Moderate Assistance - Patient 50 - 74%     Transfers Chair/bed transfer  Transfers assist  Chair/bed transfer activity did not occur: Safety/medical  concerns  Chair/bed transfer assist level: Moderate Assistance - Patient 50 - 74%     Locomotion Ambulation   Ambulation assist   Ambulation activity did not occur: Safety/medical concerns (fatigue, weakness, pain)          Walk 10 feet activity   Assist  Walk 10 feet activity did not occur: Safety/medical concerns (fatigue, weakness, pain)        Walk 50 feet activity   Assist Walk 50 feet with 2 turns activity did not occur: Safety/medical concerns (fatigue, weakness, pain)         Walk 150 feet activity   Assist Walk  150 feet activity did not occur: Safety/medical concerns (fatigue, weakness, pain)         Walk 10 feet on uneven surface  activity   Assist Walk 10 feet on uneven surfaces activity did not occur: Safety/medical concerns (fatigue, weakness, pain)         Wheelchair     Assist Is the patient using a wheelchair?: Yes Type of Wheelchair: Manual    Wheelchair assist level: Supervision/Verbal cueing Max wheelchair distance: 49ft    Wheelchair 50 feet with 2 turns activity    Assist        Assist Level: Supervision/Verbal cueing   Wheelchair 150 feet activity     Assist      Assist Level: Dependent - Patient 0%   Blood pressure 108/68, pulse 66, temperature 97.8 F (36.6 C), resp. rate 16, weight 135 kg, SpO2 99 %.     Medical Problem List and Plan: 1. Functional deficits secondary to Debility due to septic shock              -patient may shower             -ELOS/Goals: 10-14 days, mod I PT/OT/SLP  -Continue CIR, PT,OT,SLP 2.  Antithrombotics: -DVT/anticoagulation:  Pharmaceutical: Coumadin and Lovenox             -antiplatelet therapy: NA 3. Chronic low back pain:  Tylenol prn. Tramadol added prn 4. Mood: LCSW to follow for evaluation and support.              -antipsychotic agents: N/A 5. Neuropsych: This patient is capable of making decisions on his own behalf. 6. Skin/Wound Care: Routine pressure relief measures.  7. Fluids/Electrolytes/Nutrition:  Monitor I/O. Check CMET in am. 8. CAD/ICM s/p BiV: Monitor weights daily and for symptoms with increase in activity.             --continue Coreg, Jardiance, pravastatin and Entresto.              --low salt HH diet.              -Denies chest pain  -Cardiology signed off 6/1, appreciate assistance  -Denies chest pain 9. A fib w/RVR: HR controlled on amiodarone and Coreg.             --monitor for symptoms with increase in activity              -Denies palpitations  -Continue coumadin, pharmacy  following 10 . Pre-diabetes: Hgb A1C- 5.7. Monitor BS ac/hs--should improve with d/c of prednisone today             --continue insulin glargline but wean to off as BS improve.   -6/1Glucose well controlled this AM, Will decrease to 5 units  -6/2 Decrease insulin to 3 units 11. CKD III: Avoid nephrotoxic meds. Recheck CMET in am.  -  Cr 1.18 5/31 -6/1 Cr 1.25, stable overall 12. BPH: Will monitor voiding with bladder scan.  13. 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   14. COPD: Pulmonary hygiene. Completes prednisone 40 mg X 5 days to day. --Continue Incruse daily.  -Continue O2 [place] if needed, currently off -Denies shortness of breath 15. Constipation             -Increase miralax to BID  Continue sorbitol, Dulcolax supp prn 16. Urinary Incontinence, reports going of since initial admission  -U/A ordered, +wbc bacteria, Will order keflex for UTI. UC reviewed and shows 20,000 colonies of Proteus. F/u sensitivities.     LOS: 4 days A FACE TO FACE EVALUATION WAS PERFORMED  Ricky Hayden 10/05/2021, 2:45 PM

## 2021-10-06 DIAGNOSIS — R32 Unspecified urinary incontinence: Secondary | ICD-10-CM

## 2021-10-06 DIAGNOSIS — D649 Anemia, unspecified: Secondary | ICD-10-CM

## 2021-10-06 LAB — CBC
HCT: 31 % — ABNORMAL LOW (ref 39.0–52.0)
Hemoglobin: 10 g/dL — ABNORMAL LOW (ref 13.0–17.0)
MCH: 31.3 pg (ref 26.0–34.0)
MCHC: 32.3 g/dL (ref 30.0–36.0)
MCV: 97.2 fL (ref 80.0–100.0)
Platelets: 101 10*3/uL — ABNORMAL LOW (ref 150–400)
RBC: 3.19 MIL/uL — ABNORMAL LOW (ref 4.22–5.81)
RDW: 15 % (ref 11.5–15.5)
WBC: 4 10*3/uL (ref 4.0–10.5)
nRBC: 0 % (ref 0.0–0.2)

## 2021-10-06 LAB — PROTIME-INR
INR: 3.6 — ABNORMAL HIGH (ref 0.8–1.2)
Prothrombin Time: 36 seconds — ABNORMAL HIGH (ref 11.4–15.2)

## 2021-10-06 LAB — GLUCOSE, CAPILLARY
Glucose-Capillary: 87 mg/dL (ref 70–99)
Glucose-Capillary: 86 mg/dL (ref 70–99)
Glucose-Capillary: 97 mg/dL (ref 70–99)
Glucose-Capillary: 118 mg/dL — ABNORMAL HIGH (ref 70–99)

## 2021-10-06 MED ORDER — INSULIN GLARGINE-YFGN 100 UNIT/ML ~~LOC~~ SOLN: 2.0000 [IU] | Freq: Every day | SUBCUTANEOUS | Status: DC

## 2021-10-06 MED ORDER — WARFARIN SODIUM 1 MG PO TABS
1.0000 mg | ORAL_TABLET | Freq: Once | ORAL | Status: AC
Start: 2021-10-06 — End: 2021-10-06
  Administered 2021-10-06: 1 mg via ORAL
  Filled 2021-10-06: qty 1

## 2021-10-06 NOTE — Plan of Care (Signed)
  Problem: Sit to Stand Goal: LTG:  Patient will perform sit to stand in prep for activites of daily living with assistance level (OT) Description: LTG:  Patient will perform sit to stand in prep for activites of daily living with assistance level (OT) Flowsheets (Taken 10/06/2021 1038) LTG: PT will perform sit to stand in prep for activites of daily living with assistance level: Independent with assistive device   Problem: RH Bathing Goal: LTG Patient will bathe all body parts with assist levels (OT) Description: LTG: Patient will bathe all body parts with assist levels (OT) Flowsheets (Taken 10/06/2021 1038) LTG: Pt will perform bathing with assistance level/cueing: Set up assist    Problem: RH Dressing Goal: LTG Patient will perform upper body dressing (OT) Description: LTG Patient will perform upper body dressing with assist, with/without cues (OT). Flowsheets (Taken 10/06/2021 1038) LTG: Pt will perform upper body dressing with assistance level of: Independent   Problem: RH Toileting Goal: LTG Patient will perform toileting task (3/3 steps) with assistance level (OT) Description: LTG: Patient will perform toileting task (3/3 steps) with assistance level (OT)  Flowsheets (Taken 10/06/2021 1038) LTG: Pt will perform toileting task (3/3 steps) with assistance level: Independent with assistive device   Problem: RH Simple Meal Prep Goal: LTG Patient will perform simple meal prep w/assist (OT) Description: LTG: Patient will perform simple meal prep with assistance, with/without cues (OT). Flowsheets (Taken 10/06/2021 1038) LTG: Pt will perform simple meal prep with assistance level of: Supervision/Verbal cueing   Problem: RH Toilet Transfers Goal: LTG Patient will perform toilet transfers w/assist (OT) Description: LTG: Patient will perform toilet transfers with assist, with/without cues using equipment (OT) Flowsheets (Taken 10/06/2021 1038) LTG: Pt will perform toilet transfers with assistance  level of: Independent with assistive device   Problem: RH Tub/Shower Transfers Goal: LTG Patient will perform tub/shower transfers w/assist (OT) Description: LTG: Patient will perform tub/shower transfers with assist, with/without cues using equipment (OT) Flowsheets (Taken 10/06/2021 1038) LTG: Pt will perform tub/shower transfers from: Walk in shower   Problem: RH Balance Goal: LTG Patient will maintain dynamic standing with ADLs (OT) Description: LTG:  Patient will maintain dynamic standing balance with assist during activities of daily living (OT)  Flowsheets (Taken 10/06/2021 1041) LTG: Pt will maintain dynamic standing balance during ADLs with: Independent with assistive device

## 2021-10-06 NOTE — Progress Notes (Addendum)
PROGRESS NOTE   Subjective/Complaints: No new complaints or concerns. Reports urinary incontinence is better.   Review of Systems  Constitutional:  Negative for chills and fever.  Respiratory:  Negative for cough and shortness of breath.   Cardiovascular:  Negative for chest pain and palpitations.  Gastrointestinal:  Negative for abdominal pain, nausea and vomiting.  Neurological:  Negative for focal weakness.  +low back pain- stable   Objective:   No results found. Recent Labs    10/06/21 0621  WBC 4.0  HGB 10.0*  HCT 31.0*  PLT 101*    No results for input(s): NA, K, CL, CO2, GLUCOSE, BUN, CREATININE, CALCIUM in the last 72 hours.   Intake/Output Summary (Last 24 hours) at 10/06/2021 0927 Last data filed at 10/06/2021 0754 Gross per 24 hour  Intake 1434 ml  Output --  Net 1434 ml          Physical Exam: Vital Signs Blood pressure (!) 99/52, pulse 62, temperature 98.3 F (36.8 C), temperature source Oral, resp. rate 19, weight 99.2 kg, SpO2 92 %.  General: Alert and oriented to self, No apparent distress. In bed appears comfortable HEENT: Healed crani incision, PERRLA, EOMI, sclera anicteric, oral mucosa pink and moist, working with therapy Neck: Supple without JVD or lymphadenopathy Heart: Reg rate and rhythm.  Chest: CTA bilaterally without wheezes, rales, or rhonchi; no distress, on RA, deconditioned, requires rest between activities Abdomen: Soft, non-tender, non-distended, bowel sounds positive. Extremities: No clubbing, cyanosis, or edema. Pulses are 2+ Psych: Pt's affect is appropriate. Pt is cooperative Skin: warm and dry Neuro:  Follows commands, Answer questions, able to repeat 3 words, makes eye contact. Sensation intact in all 4 extremities although reports altered sensation digits 4+5 both hands, tinels neg at elbow and wrist, intrensic hand muscles atrophy noted b/l Musculoskeletal: No joint  swelling or tenderness 5/5 b/l UE strength 5/5 in b/l LE with exception of Hip flexion 4/5 bilaterally Normal bulk and tone    Assessment/Plan: 1. Functional deficits which require 3+ hours per day of interdisciplinary therapy in a comprehensive inpatient rehab setting. Physiatrist is providing close team supervision and 24 hour management of active medical problems listed below. Physiatrist and rehab team continue to assess barriers to discharge/monitor patient progress toward functional and medical goals  Care Tool:  Bathing    Body parts bathed by patient: Right arm, Left arm, Chest, Abdomen, Front perineal area, Buttocks, Right upper leg, Left upper leg, Right lower leg, Left lower leg, Face   Body parts bathed by helper: Buttocks     Bathing assist Assist Level: Contact Guard/Touching assist     Upper Body Dressing/Undressing Upper body dressing   What is the patient wearing?: Pull over shirt    Upper body assist Assist Level: Independent    Lower Body Dressing/Undressing Lower body dressing      What is the patient wearing?: Incontinence brief, Pants     Lower body assist Assist for lower body dressing: Moderate Assistance - Patient 50 - 74%     Toileting Toileting    Toileting assist Assist for toileting: Moderate Assistance - Patient 50 - 74%     Transfers Chair/bed transfer  Transfers assist  Chair/bed transfer activity did not occur: Safety/medical concerns  Chair/bed transfer assist level: Moderate Assistance - Patient 50 - 74%     Locomotion Ambulation   Ambulation assist   Ambulation activity did not occur: Safety/medical concerns (fatigue, weakness, pain)          Walk 10 feet activity   Assist  Walk 10 feet activity did not occur: Safety/medical concerns (fatigue, weakness, pain)        Walk 50 feet activity   Assist Walk 50 feet with 2 turns activity did not occur: Safety/medical concerns (fatigue, weakness, pain)          Walk 150 feet activity   Assist Walk 150 feet activity did not occur: Safety/medical concerns (fatigue, weakness, pain)         Walk 10 feet on uneven surface  activity   Assist Walk 10 feet on uneven surfaces activity did not occur: Safety/medical concerns (fatigue, weakness, pain)         Wheelchair     Assist Is the patient using a wheelchair?: Yes Type of Wheelchair: Manual    Wheelchair assist level: Supervision/Verbal cueing Max wheelchair distance: 44ft    Wheelchair 50 feet with 2 turns activity    Assist        Assist Level: Supervision/Verbal cueing   Wheelchair 150 feet activity     Assist      Assist Level: Dependent - Patient 0%   Blood pressure (!) 99/52, pulse 62, temperature 98.3 F (36.8 C), temperature source Oral, resp. rate 19, weight 99.2 kg, SpO2 92 %.     Medical Problem List and Plan: 1. Functional deficits secondary to Debility due to septic shock              -patient may shower             -ELOS/Goals: 10-14 days, mod I PT/OT/SLP  -Continue CIR, PT,OT,SLP  -Walked 62ft with RW 2.  Antithrombotics: -DVT/anticoagulation:  Pharmaceutical: Coumadin and Lovenox             -antiplatelet therapy: NA 3. Chronic low back pain:  Tylenol prn. Tramadol added prn 4. Mood: LCSW to follow for evaluation and support.              -antipsychotic agents: N/A 5. Neuropsych: This patient is capable of making decisions on his own behalf. 6. Skin/Wound Care: Routine pressure relief measures.  7. Fluids/Electrolytes/Nutrition:  Monitor I/O. Check CMET in am. 8. CAD/ICM s/p BiV: Monitor weights daily and for symptoms with increase in activity.             --continue Coreg, Jardiance, pravastatin and Entresto.              --low salt HH diet.              -Denies chest pain  -Cardiology signed off 6/1, appreciate assistance 9. A fib w/RVR: HR controlled on amiodarone and Coreg.             --monitor for symptoms with increase in  activity              -Denies palpitations  -Continue coumadin, pharmacy following, INR a little elevated today at 3.6 on 6/5 10 . Pre-diabetes: Hgb A1C- 5.7. Monitor BS ac/hs--should improve with d/c of prednisone today             --continue insulin glargline but wean to off as BS improve.   -6/1Glucose well controlled this  AM, Will decrease to 5 units  -6/2 Decrease insulin to 3 units  -6/5 DC insulin 11. CKD III: Avoid nephrotoxic meds. Recheck CMET in am.  -Cr 1.18 5/31 -6/1 Cr 1.25, stable overall 12. BPH: Will monitor voiding with bladder scan.  13. 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   14. COPD: Pulmonary hygiene. Completes prednisone 40 mg X 5 days to day. --Continue Incruse daily.  -Continue O2 [place] if needed, currently off 15. Constipation             -Increase miralax to BID  Continue sorbitol, Dulcolax supp prn 16. Urinary Incontinence, reports going of since initial admission  -U/A ordered, +wbc bacteria, Will order keflex for UTI. UC reviewed and shows 20,000 colonies of Proteus. F/u sensitivities.   -pt reports incontinence is  improved, check additional bladder scans 17. Anemia  -HBG stable at 10, continue to monitor on routine labs    LOS: 5 days A FACE TO FACE EVALUATION WAS PERFORMED  Jennye Boroughs 10/06/2021, 9:27 AM

## 2021-10-06 NOTE — Progress Notes (Addendum)
Occupational Therapy Session Note  Patient Details  Name: Ricky Hayden MRN: [id number] Date of Birth: 05/27/1945  Today's Date: 10/06/2021 OT Individual Time: 0920-1003 OT Individual Time Calculation (min): 43 min    Short Term Goals: Week 1:  OT Short Term Goal 1 (Week 1): Pt will complete LB ADL at EOB  with min A for balance using AE OT Short Term Goal 2 (Week 1): Pt will complete stand pivot transfer from EOB to [facility] with MinA  at 90%safe OT Short Term Goal 3 (Week 1): Pt will complete grooming activity at sink at w/c LOF with s/u only at 90% safe OT Short Term Goal 4 (Week 1): Pt will don pants with ModA using AE for addiitonal reach at chair LOF and 90%safe OT Short Term Goal 5 (Week 1): The pt will tolerate greater than 25 mins of activity at 90% safe with 2-3 rest breaks.     Skilled Therapeutic Interventions/Progress Updates:   Pt received in w/c ready for therapy Self propelled wc to gym.     Stood 7.5 min to work on Radiation protection practitioner [facility] with good balance.  UE strengthening with 3 # dowel bar for chest press 10x, sh press 10x 4 sets.  Stood again to remove nuts/bolts from board for 4 min.  Discussed possible d/c date and alerted team. Pt returned to room with all needs met. Chair alarm set.    Therapy Documentation Precautions:  Precautions Precautions: Fall Precaution Comments: monitor O2 Restrictions Weight Bearing Restrictions: No  Pain:  No c/o pain  ADL: ADL Eating: Set up Where Assessed-Eating: Bed level Grooming: Setup Where Assessed-Grooming: Edge of bed Upper Body Bathing: Setup Where Assessed-Upper Body Bathing: Shower Lower Body Bathing: Contact guard Where Assessed-Lower Body Bathing: Shower Upper Body Dressing: Independent Where Assessed-Upper Body Dressing: Wheelchair Lower Body Dressing: Moderate assistance Where Assessed-Lower Body Dressing: Wheelchair Toileting: Moderate assistance Where Assessed-Toileting: Glass blower/designer: Medical laboratory scientific officer Method: Counselling psychologist: Energy manager: Maximal assistance (based on observation) Tub/Shower Transfer Method:  (based on observation) Social research officer, government: Curator Method: Heritage manager: Grab bars, Transfer tub bench  Therapy/Group: Individual Therapy  [place] 10/06/2021, 10:51 AM

## 2021-10-06 NOTE — Progress Notes (Signed)
[place] for Warfarin and Enoxaparin Indication: atrial fibrillation  Allergies  Allergen Reactions   Calcium Channel Blockers Other (See Comments)    Came to hospital in 1995-caused chest pain     Patient Measurements: Weight: 99.2 kg (218 lb 12.8 oz)  Vital Signs: Temp: 98.3 F (36.8 C) (06/05 0453) Temp Source: Oral (06/05 0453) BP: 99/52 (06/05 0453) Pulse Rate: 62 (06/05 0453)  Labs: Recent Labs    10/04/21 0622 10/05/21 0621 10/06/21 0621  HGB  --   --  10.0*  HCT  --   --  31.0*  PLT  --   --  101*  LABPROT 24.2* 30.5* 36.0*  INR 2.2* 2.9* 3.6*    Estimated Creatinine Clearance: 55.5 mL/min (A) (by C-G formula based on SCr of 1.25 mg/dL (H)).   Assessment: 76 year old male with new PAF. Patient recently had TEE/DCCV on 5/26. Patient started on enoxaparin and warfarin bridge on 5/30. Due to interaction with phenobarbital ([place] anticoagulation/drug interaction document), DOACs should not be used. Interaction discussed with cardiology on 5/31. Cardiology recommending to continue with warfarin therapy.   INR therapeutic x2 days, now supra-therapeutic at 3.6. Will stop enoxaparin bridge. CBC stable, no signs bleeding reported. Will continue reduced warfarin dose for now and monitor closely.   Goal of Therapy:  INR 2-3 Monitor platelets by anticoagulation protocol: Yes   Plan:  Stop enoxaparin (INR now therapeutic x3 days) Give warfarin 1 mg PO x1 dose Check INR daily while on warfarin Continue to monitor H&H and platelets    Thank you for allowing pharmacy to be a part of this patient's care.  Ardyth Harps, PharmD Clinical Pharmacist

## 2021-10-06 NOTE — Consult Note (Signed)
Neuropsychological Consultation   Patient:   Ricky Hayden   DOB:   [birth year]  MR Number:  [id number]  Location:  [facility] [id number] [place] [address] Dept: [place]: [phone]           Date of Service:   10/06/2021  Start Time:   10 AM End Time:   11 AM  Provider/Observer:  Ilean Skill, Psy.D.       Clinical Neuropsychologist       Billing Code/Service: [phone]  Chief Complaint:    Dre Gamino is a 76 year old male with past history of seizure disorder, kidney decline, COPD, CAD, prediabetes.  Patient was originally admitted in April 2023 with sepsis due to bacterial edema.  He was admitted to CIR on 09/19/2021 for intensive rehab program and continued to require supplemental oxygen.  Patient with significant hypotensive event and transferred to acute hospital on 5/25.  Patient has returned to the unit after stabilization and improved respiratory status.  Patient restarted PT/OT due to functional decline.  Reason for Service:  Patient referred for neuropsychological consultation due to coping and adjustment issues with extended hospital stay and critical illness.  Below is the HPI for the current admission.  HPI: Ricky Hayden is a 76 year old male with history of Seizure d/o, CKD-baseline SCr 1.5, COPD- oxygen HS, CAD/cardiac arrhthymias s/p ICD/PPM, pre-diabetes;  who was originally admitted on 08/29/21 with Sepsis due to proteus bacteremia and urinary retention and started on Cefazolin but developed respiratory distress requiring intubation followed by brief PEA arrest and aspiration PNA with septic shock and ARF due to ATN.  Antibiotics transitioned to Unasyn with end date of 05/10 and treated with stress dose steriods. He was noted to have generalized weakness with delay in processing and poor awareness of deficits.    He was admitted to CIR on 05/19/123 for intensive  rehab program and continued to require supplemental oxygen with bouts of lethargy. Hospital course significant for hypotension with wide complex tachycardia despite loading with amiodarone 400 mg bid. He was transferred to acute hospital on 05/25 and underwent TEE/DCCV on 05/26 to NSR. Stress dose steroids added to help with respiratory status. Coreg added on 05/29 with recommendations to taper amiodarone over next few weeks.  He was extubated to 3 L oxygen and Eliquis changed to coumadin with lovenox bridge to avoid interaction with phenobarbital. BP has stabilized but does have transient orthostatic symptoms, respiratory status. PT/OT has been working with patient and he is showing improvement in mentation but continues to be limited by debility. CIR recommended due to functional decline.  Pt reports very fatigued today. He is happy to be back at CIR. Reports constipation. Reports he is sleeping well.   Current Status:  Patient was awake and alert and in good spirits.  Patient reports that during one of his previous units that he had a lot of difficulty coping and adjusting and had several times where he got very upset at times particularly feeling like he was being left in a chair and ignored.  The patient reports that he now understands more about different roles various providers and nurses have and reports that he is coping much better recently.  We addressed issues around some of his concerns with his own health functioning and his extended hospital stay.  Behavioral Observation: Ricky Hayden  presents as a 76 y.o.-year-old Right  Male who appeared his stated age. his dress  was Appropriate and he was Well Groomed and his manners were Appropriate to the situation.  his participation was indicative of Appropriate behaviors.  There were physical disabilities noted.  he displayed an appropriate level of cooperation and motivation.     Interactions:    Active Appropriate  Attention:   abnormal and  attention span appeared shorter than expected for age  Memory:   within normal limits; recent and remote memory intact  Visuo-spatial:  not examined  Speech (Volume):  normal  Speech:   normal; normal  Thought Process:  Coherent and Relevant  Though Content:  WNL; not suicidal and not homicidal  Orientation:   person, place, time/date, and situation  Judgment:   Fair  Planning:   Fair  Affect:    Appropriate  Mood:    Euthymic  Insight:   Good  Intelligence:   normal  Medical History:   Past Medical History:  Diagnosis Date   Arthritis    osteoarthritis of left knee   Ascending aorta dilatation (HCC)    Balanitis    recurrent   Cardiac arrhythmia    life threatening, secondary to CCB vs b- blockers   Cardiomyopathy ([place])    Chronic joint pain    Chronic systolic CHF (congestive heart failure) (HCC)    CKD (chronic kidney disease), stage III (HCC)    COPD (chronic obstructive pulmonary disease) ([place])    Coronary artery disease    Dilated aortic root (HCC)    Enlarged prostate    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 Sherwood Gambler   Nocturia    Obesity    Pansinusitis    a.  complicated by brain abscess and bleeding requiring craniotomy in 1995.   PONV (postoperative nausea and vomiting)    Sinusitis    s/p ethmoidectomy and nasal septoplasty   Vertigo    intermitantly         Patient Active Problem List   Diagnosis Date Noted   Biventricular ICD (implantable cardioverter-defibrillator) in place    Hypotension 09/26/2021   A-fib ([place]) 09/25/2021   Debility 09/19/2021   Cardiac arrest ([place]) 09/18/2021   Delirium 09/18/2021   Morbid obesity ([place]) 09/18/2021   Nephrolithiasis 09/18/2021   Paroxysmal atrial fibrillation ([place]) 09/17/2021   History of craniotomy  09/17/2021   Seizure disorder ([place]) 09/17/2021   Normocytic anemia 09/17/2021   Pressure injury of sacral region, stage 2 ([place]) 09/17/2021   Goals of care, counseling/discussion    Type 2 diabetes mellitus with chronic kidney disease, without long-term current use of insulin ([place]) 09/14/2021   Bacteremia due to Gram-negative bacteria    Complicated UTI (urinary tract infection)    Acute on chronic respiratory failure with hypoxia and hypercapnia (HCC)    Septic shock (HCC)    Proteus (mirabilis) ([place]) as the cause of diseases classified elsewhere 09/02/2021   ARF (acute renal failure) ([place]) 08/29/2021   Presence of biventricular cardiac pacemaker 05/15/2020   PVC (premature ventricular contraction) 12/11/2019   NICM (nonischemic cardiomyopathy) ([place]) 12/11/2019   Stage 3b chronic kidney disease (CKD) ([place]) 08/12/2019   Hyperkalemia    Acute on chronic combined systolic and diastolic CHF, NYHA class 2 ([place]) 06/10/2015   Leg swelling 07/16/2014   Preventative health care 03/07/2014  COPD (chronic obstructive pulmonary disease) ([place]) 09/11/2013   BPPV (benign paroxysmal positional vertigo) 10/05/2012   BPH (benign prostatic hyperplasia) 01/10/2008   Gout 04/22/2006   ERECTILE DYSFUNCTION 04/22/2006   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  Patient was originally admitted in April 2023 with sepsis due to bacterial edema.  He was admitted to CIR on 09/19/2021 for intensive rehab program and  continued to require supplemental oxygen.  Patient with significant hypotensive event and transferred to acute hospital on 5/25.  Patient has returned to the unit after stabilization and improved respiratory status.  Patient restarted PT/OT due to functional decline.  Patient was awake and alert and in good spirits.  Patient reports that during one of his previous units that he had a lot of difficulty coping and adjusting and had several times where he got very upset at times particularly feeling like he was being left in a chair and ignored.  The patient reports that he now understands more about different roles various providers and nurses have and reports that he is coping much better recently.  We addressed issues around some of his concerns with his own health functioning and his extended hospital stay.  Disposition/Plan:  Today we worked on coping and adjustment issues around extended hospital stay and his complicated medical status even before his most recent critical illness.  Diagnosis:    Debility secondary to critical illness and sepsis         Electronically Signed   _______________________ Ilean Skill, Psy.D. Clinical Neuropsychologist

## 2021-10-06 NOTE — Progress Notes (Signed)
SLP Cancellation Note  Patient Details Name: Ricky Hayden MRN: [id number] DOB: Jul 18, 1945   Cancelled treatment:       Pt missed 45 minutes of scheduled ST intervention due to scheduling conflict. At time of SLP attempt, pt working with neuropsychologist for 40 minutes.    Enzo Treu A [name] 10/06/2021, 1:29 PM

## 2021-10-06 NOTE — Progress Notes (Signed)
Occupational Therapy Session Note  Patient Details  Name: Ricky Hayden MRN: [id number] Date of Birth: May 06, 1945  Today's Date: 10/06/2021 OT Individual Time: 1115-1200 OT Individual Time Calculation (min): 45 min   OT Individual Time: 1400-1445 Total: 76'   Short Term Goals: Week 1:  OT Short Term Goal 1 (Week 1): Pt will complete LB ADL at EOB  with min A for balance using AE OT Short Term Goal 2 (Week 1): Pt will complete stand pivot transfer from EOB to [facility] with MinA  at 90%safe OT Short Term Goal 3 (Week 1): Pt will complete grooming activity at sink at w/c LOF with s/u only at 90% safe OT Short Term Goal 4 (Week 1): Pt will don pants with ModA using AE for addiitonal reach at chair LOF and 90%safe OT Short Term Goal 5 (Week 1): The pt will tolerate greater than 25 mins of activity at 90% safe with 2-3 rest breaks.  Skilled Therapeutic Interventions/Progress Updates:    Session 1: S:  Pt reports that he has been working on walking a lot this week.   O: - UB strengthening: Played Connect 4 while seated using 1.5# bilateral wrist weights. - Hand strengthening: Utilized green resistive clothespin in right hand and a 3 point pinch to pick up and stack 4 towers of square and cylinder foam pieces.   A: Session focused on BUE strengthening and hand strength in order to increase functional performance when completing ADL tasks and functional transfers. Patient demonstrated muscle fatigue towards end of session when completing hand strengthening task. Required VC for form and technique. Rest breaks were encouraged throughout session. Did not fatigue during Connect 4 although reported afterwards that his arms were tired.    P: Trial use of Rolator in afternoon session.     Session 2: S: Pt states that he is eager to use the Rolator during session. Reports he owns one.   O: - Patient assisted therapist with retrieving Rolator from storage room on [address]. Patient performed  functional ambulation while navigating his environment using the Rolator at Supervision level (w/c follow).     A: Session focused on safe and functional use of the Rolator while navigating within patient's environment. Provided education on energy conservation and activity tolerance during session. Patient initially ambulated quickly allowing the device to take him at a greater speed then recommended. Patient experienced fatigue and required several seated rest breaks during session. Patient was able to verbalize understanding of decreasing his cadence and maintain an erect standing posture when walking. Pt self monitored his fatigue and was able to verbalize need for rest breaks when needed.    P: Continue to work on independence with Systems analyst. Work on slow speed and upright posture during use.   Therapy Documentation Precautions:  Precautions Precautions: Fall Precaution Comments: monitor O2 Restrictions Weight Bearing Restrictions: No  Pain:  No reports of pain.   Therapy/Group: Individual Therapy  Ailene Ravel, OTR/L,CBIS  Supplemental OT - [place] [facility]  10/06/2021, 4:29 PM

## 2021-10-06 NOTE — Progress Notes (Signed)
Physical Therapy Session Note  Patient Details  Name: Ricky Hayden MRN: [id number] Date of Birth: [birth year]  Today's Date: 10/06/2021 PT Individual Time: 0800-0857 PT Individual Time Calculation (min): 57 min   Short Term Goals: Week 1:  PT Short Term Goal 1 (Week 1): pt will transfer sit<>stand with LRAD and minA PT Short Term Goal 2 (Week 1): pt will transfer bed<>chair with LRAD and CGA PT Short Term Goal 3 (Week 1): pt will ambulate 68ft with LRAD and min A Week 2:    Week 3:     Skilled Therapeutic Interventions/Progress Updates:    Pt initially seated on EOB requesting therapist obtain pants. Pt donned pants w/set up in sitting/standing w/RW. Short distance gait to wc w/Rw and supervision  Functional gait: 21ft thru cones/obstacle course w/close supervision w/RW  Gait 163ft w/RW w/close supervision.  HR 103, 02 sats 97-98% on room air.  Standing functional balance holding yellow weighted ball performing crossbody reach no UE support, mild c/o dizzyness w/rotational task.  Standing lifting ball from waist height to overhead, c/o dizzyness when moving eyes/head, improves when fixing gaze on stable target during activity.  Gaze stabilization screen - pt performed rotation of head w/eyes fixed on target - ? Skipping vs distraction but minimal dizzyness Nods w/eyes fixed performs without saccades/nystagmus but min dizzyness.  Repeated sit to stand x 10 w/supervision to RW.      Therapy Documentation Precautions:  Precautions Precautions: Fall Precaution Comments: monitor O2 Restrictions Weight Bearing Restrictions: No Therapy/Group: Individual Therapy Callie Fielding, [place] 10/06/2021, 9:01 AM

## 2021-10-07 DIAGNOSIS — N183 Chronic kidney disease, stage 3 unspecified: Secondary | ICD-10-CM

## 2021-10-07 DIAGNOSIS — N400 Enlarged prostate without lower urinary tract symptoms: Secondary | ICD-10-CM

## 2021-10-07 LAB — BASIC METABOLIC PANEL
Anion gap: 5 (ref 5–15)
BUN: 10 mg/dL (ref 8–23)
CO2: 28 mmol/L (ref 22–32)
Calcium: 9 mg/dL (ref 8.9–10.3)
Chloride: 107 mmol/L (ref 98–111)
Creatinine, Ser: 1.24 mg/dL (ref 0.61–1.24)
GFR, Estimated: 60 mL/min — ABNORMAL LOW (ref >60)
Glucose, Bld: 94 mg/dL (ref 70–99)
Potassium: 4.3 mmol/L (ref 3.5–5.1)
Sodium: 140 mmol/L (ref 135–145)

## 2021-10-07 LAB — PROTIME-INR
INR: 2.8 — ABNORMAL HIGH (ref 0.8–1.2)
Prothrombin Time: 29.3 seconds — ABNORMAL HIGH (ref 11.4–15.2)

## 2021-10-07 LAB — GLUCOSE, CAPILLARY
Glucose-Capillary: 93 mg/dL (ref 70–99)
Glucose-Capillary: 82 mg/dL (ref 70–99)
Glucose-Capillary: 101 mg/dL — ABNORMAL HIGH (ref 70–99)
Glucose-Capillary: 96 mg/dL (ref 70–99)

## 2021-10-07 MED ORDER — WARFARIN SODIUM 5 MG PO TABS
5.0000 mg | ORAL_TABLET | Freq: Once | ORAL | Status: AC
  Administered 2021-10-07: 5 mg via ORAL
  Filled 2021-10-07: qty 1

## 2021-10-07 NOTE — Progress Notes (Signed)
Physical Therapy Session Note  Patient Details  Name: Ricky Hayden MRN: [id number] Date of Birth: 1945-12-05  Today's Date: 10/07/2021 PT Individual Time: 6213-0865 PT Individual Time Calculation (min): 39 min   Short Term Goals: Week 1:  PT Short Term Goal 1 (Week 1): pt will transfer sit<>stand with LRAD and minA PT Short Term Goal 2 (Week 1): pt will transfer bed<>chair with LRAD and CGA PT Short Term Goal 3 (Week 1): pt will ambulate 66ft with LRAD and min A  Skilled Therapeutic Interventions/Progress Updates:    Pt presents up in w/c. Discussed overall goals and progress with therapies. Engaged in functional gait training with rollator (reports he used one in the past from [place] from having [place] but progressed away from it for a period of time) x 150' x 2 with CGA progressing to supervision overall and occasional cues for safe brake management. Nustep for general strengthening and endurance of BLE and cardiovascular endurance with BLE only on level 6 x 10 min. Practiced curb step negotiation on 4" step to introduce for home entry - due to time restrain, could not go down to the other gym to practice with lifting rollator up and down so just completed the step portion while pushing the rollator. Pt able to verbalize the technique though as he is familiar with it from before - overall CGA. May require up to min assist for actual step with rollator management, but can trial in future session. Pt required 1 rest break seated on rollator on the way back to room due to fatigue, but able to complete the rest of the walk with supervision. All needs in reach and request to stay up in w/c awaiting daughter visiting later.   Therapy Documentation Precautions:  Precautions Precautions: Fall Precaution Comments: monitor O2 Restrictions Weight Bearing Restrictions: No  Pain: Pain Assessment Pain Scale: 0-10 Pain Score: 0-No pain Some ongoing discomfort in LLE which he repots has been chronic. Rest  breaks provided as needed.      Therapy/Group: Individual Therapy  Canary Brim Ivory Broad, PT, DPT, CBIS  10/07/2021, 2:43 PM

## 2021-10-07 NOTE — Progress Notes (Signed)
PROGRESS NOTE   Subjective/Complaints: Pt in Bed, no new concerns. Had bladder scans completed. He reports his incontinence is improved overall.   Review of Systems  Constitutional:  Negative for chills and fever.  Respiratory:  Negative for cough and shortness of breath.   Cardiovascular:  Negative for chest pain and palpitations.  Gastrointestinal:  Negative for abdominal pain, nausea and vomiting.  Genitourinary:  Positive for urgency. Negative for dysuria and flank pain.  Neurological:  Negative for focal weakness.  +low back pain- stable   Objective:   No results found. Recent Labs    10/06/21 0621  WBC 4.0  HGB 10.0*  HCT 31.0*  PLT 101*    Recent Labs    10/07/21 0535  NA 140  K 4.3  CL 107  CO2 28  GLUCOSE 94  BUN 10  CREATININE 1.24  CALCIUM 9.0     Intake/Output Summary (Last 24 hours) at 10/07/2021 0934 Last data filed at 10/07/2021 0741 Gross per 24 hour  Intake 1188 ml  Output 0 ml  Net 1188 ml          Physical Exam: Vital Signs Blood pressure 123/79, pulse 64, temperature 98.1 F (36.7 C), resp. rate 14, weight 99.2 kg, SpO2 99 %.  General: NAD. In bed appears comfortable HEENT: Healed crani incision, PERRLA, EOMI, sclera anicteric, oral mucosa pink and moist, working with therapy Neck: Supple without JVD or lymphadenopathy Heart: Reg rate and rhythm.  Chest: CTA bilaterally without wheezes, rales, or rhonchi; no distress, on RA, deconditioned Abdomen: Soft, non-tender, non-distended, bowel sounds positive. Extremities: No clubbing, cyanosis, or edema. Pulses are 2+ Psych: Very pleasant Skin: warm and dry, no breakdown noted Neuro:  Follows commands, Answer questions, able to repeat 3 words, makes eye contact. Sensation intact in all 4 extremities although reports altered sensation digits 4+5 both hands, tinels neg at elbow and wrist, intrensic hand muscles atrophy noted  b/l Musculoskeletal: No joint swelling or tenderness 5/5 b/l UE strength 5/5 in b/l LE with exception of Hip flexion 4/5 bilaterally     Assessment/Plan: 1. Functional deficits which require 3+ hours per day of interdisciplinary therapy in a comprehensive inpatient rehab setting. Physiatrist is providing close team supervision and 24 hour management of active medical problems listed below. Physiatrist and rehab team continue to assess barriers to discharge/monitor patient progress toward functional and medical goals  Care Tool:  Bathing    Body parts bathed by patient: Right arm, Left arm, Chest, Abdomen, Front perineal area, Buttocks, Right upper leg, Left upper leg, Right lower leg, Left lower leg, Face   Body parts bathed by helper: Buttocks     Bathing assist Assist Level: Contact Guard/Touching assist     Upper Body Dressing/Undressing Upper body dressing   What is the patient wearing?: Pull over shirt    Upper body assist Assist Level: Independent    Lower Body Dressing/Undressing Lower body dressing      What is the patient wearing?: Incontinence brief, Pants     Lower body assist Assist for lower body dressing: Moderate Assistance - Patient 50 - 74%     Toileting Toileting    Toileting assist Assist  for toileting: Moderate Assistance - Patient 50 - 74%     Transfers Chair/bed transfer  Transfers assist  Chair/bed transfer activity did not occur: Safety/medical concerns  Chair/bed transfer assist level: Moderate Assistance - Patient 50 - 74%     Locomotion Ambulation   Ambulation assist   Ambulation activity did not occur: Safety/medical concerns (fatigue, weakness, pain)          Walk 10 feet activity   Assist  Walk 10 feet activity did not occur: Safety/medical concerns (fatigue, weakness, pain)        Walk 50 feet activity   Assist Walk 50 feet with 2 turns activity did not occur: Safety/medical concerns (fatigue, weakness,  pain)         Walk 150 feet activity   Assist Walk 150 feet activity did not occur: Safety/medical concerns (fatigue, weakness, pain)         Walk 10 feet on uneven surface  activity   Assist Walk 10 feet on uneven surfaces activity did not occur: Safety/medical concerns (fatigue, weakness, pain)         Wheelchair     Assist Is the patient using a wheelchair?: Yes Type of Wheelchair: Manual    Wheelchair assist level: Supervision/Verbal cueing Max wheelchair distance: 37ft    Wheelchair 50 feet with 2 turns activity    Assist        Assist Level: Supervision/Verbal cueing   Wheelchair 150 feet activity     Assist      Assist Level: Total Assistance - Patient < 25% (pt propels 50 ft)   Blood pressure 123/79, pulse 64, temperature 98.1 F (36.7 C), resp. rate 14, weight 99.2 kg, SpO2 99 %.     Medical Problem List and Plan: 1. Functional deficits secondary to Debility due to septic shock              -patient may shower             -ELOS/Goals: 10-14 days, mod I PT/OT/SLP  -Continue CIR, PT,OT,SLP  -Completed 165 ft gait training 2.  Antithrombotics: -DVT/anticoagulation:  Pharmaceutical: Coumadin and Lovenox             -antiplatelet therapy: NA 3. Chronic low back pain:  Tylenol prn. Tramadol added prn 4. Mood: LCSW to follow for evaluation and support.              -antipsychotic agents: N/A 5. Neuropsych: This patient is capable of making decisions on his own behalf. 6. Skin/Wound Care: Routine pressure relief measures.  7. Fluids/Electrolytes/Nutrition:  Monitor I/O. Check CMET in am. 8. CAD/ICM s/p BiV: Monitor weights daily and for symptoms with increase in activity.             --continue Coreg, Jardiance, pravastatin and Entresto.              --low salt HH diet.              -Denies chest pain  -Cardiology signed off 6/1, appreciate assistance 9. A fib w/RVR: HR controlled on amiodarone and Coreg.             --monitor for  symptoms with increase in activity              -Denies palpitations  -Continue coumadin, pharmacy following, INR a little elevated today at 3.6 on 6/5 10 . Pre-diabetes: Hgb A1C- 5.7. Monitor BS ac/hs--should improve with d/c of prednisone today             --  continue insulin glargline but wean to off as BS improve.   -6/1Glucose well controlled this AM, Will decrease to 5 units  -6/2 Decrease insulin to 3 units  -6/5 DC insulin 11. CKD III: Avoid nephrotoxic meds. Recheck CMET in am.  -Cr 1.18 5/31 -6/5 Cr 1.24 today 12. BPH: Will monitor voiding with bladder scan.  -bladder scans do not indicate retention 13. 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   -no recent seizures reported 14. COPD: Pulmonary hygiene. Completes prednisone 40 mg X 5 days to day. --Continue Incruse daily.  -Continue O2 [place] if needed, currently off 15. Constipation             -Increase miralax to BID  Continue sorbitol, Dulcolax supp prn 16. UTI, he had Urinary Incontinence, reports going of since initial admission  -U/A ordered, +wbc bacteria, Will order keflex for UTI. UC reviewed and shows 20,000 colonies of Proteus. F/u sensitivities.   -pt reports incontinence is  improved, check additional bladder scans  -6/5 bladder scans do not indicate retention, he reports incontinence is improved 17. Anemia  -HBG stable at 10, continue to monitor on routine labs    LOS: 6 days A FACE TO FACE EVALUATION WAS PERFORMED  Jennye Boroughs 10/07/2021, 9:34 AM

## 2021-10-07 NOTE — Progress Notes (Signed)
Speech Language Pathology Daily Session Note  Patient Details  Name: Ricky Hayden MRN: [id number] Date of Birth: 1946/01/26  Today's Date: 10/07/2021 SLP Individual Time: 1300-1345 SLP Individual Time Calculation (min): 45 min  Short Term Goals: Week 1: SLP Short Term Goal 1 (Week 1): Pt will demonstrate selective attention during functional tasks with supervision A verbal cues for redirection in 30-45 minute intervals. SLP Short Term Goal 2 (Week 1): Pt will demonstrated recall of daily, novel information with use of min A verbla cues for external/internal aids. SLP Short Term Goal 3 (Week 1): Pt will demonstrate functional basic-mildly complex problem solving skills with min A verbal cues. SLP Short Term Goal 4 (Week 1): Pt will self-monitor and self-correct functional errors with min A verbal cues. SLP Short Term Goal 5 (Week 1): Pt will tolerate regular textures and thin liquids with supervision A verbal cues for use of swallow stratgeies.  Skilled Therapeutic Interventions: Pt seen for skilled ST with focus on cognitive goals, pt upright in wheelchair and agreeable to therapeutic tasks. SLP facilitating mildly complex alternating attention activity providing overall min A cues for accuracy. Pt demonstrates functional recall of events of the morning and emergent awareness of current physical and cognitive deficits and how they will impact daily living at home. During structured cognitive activities, pt Mod I for error awareness and repair. Pt left in wheelchair with all needs within reach, cont ST POC.   Pain Pain Assessment Pain Scale: 0-10 Pain Score: 0-No pain  Therapy/Group: Individual Therapy  [name] 10/07/2021, 1:44 PM

## 2021-10-07 NOTE — Progress Notes (Signed)
[place] for Warfarin and Enoxaparin Indication: atrial fibrillation  Allergies  Allergen Reactions   Calcium Channel Blockers Other (See Comments)    Came to hospital in 1995-caused chest pain     Patient Measurements: Weight: 99.2 kg (218 lb 12.8 oz)  Vital Signs: Temp: 98.1 F (36.7 C) (06/06 0358) BP: 123/79 (06/06 0358) Pulse Rate: 64 (06/06 0358)  Labs: Recent Labs    10/05/21 0621 10/06/21 0621 10/07/21 0535  HGB  --  10.0*  --   HCT  --  31.0*  --   PLT  --  101*  --   LABPROT 30.5* 36.0* 29.3*  INR 2.9* 3.6* 2.8*  CREATININE  --   --  1.24    Estimated Creatinine Clearance: 56 mL/min (by C-G formula based on SCr of 1.24 mg/dL).   Assessment: 76 year old male with new PAF. Patient recently had TEE/DCCV on 5/26. Patient started on enoxaparin and warfarin bridge on 5/30. Due to interaction with phenobarbital ([place] anticoagulation/drug interaction document), DOACs should not be used. Interaction discussed with cardiology on 5/31. Cardiology recommending to continue with warfarin therapy.   Significant Drug Interactions - amiodarone: may increase INR - phenobarbital: may decrease INR  6/05 CBC stable, no signs bleeding reported. INR therapeutic x2 days, now therapeutic at 2.8. Sensitive to warfarin adjustments. Will continue reduced warfarin dose for now and monitor closely.   Goal of Therapy:  INR 2-3 Monitor platelets by anticoagulation protocol: Yes   Plan:  Give warfarin 5 mg PO x1 dose Check INR daily while on warfarin Continue to monitor H&H and platelets    Thank you for allowing pharmacy to be a part of this patient's care.  Ardyth Harps, PharmD Clinical Pharmacist

## 2021-10-07 NOTE — Progress Notes (Signed)
Occupational Therapy Session Note  Patient Details  Name: Ricky Hayden MRN: [id number] Date of Birth: 05/27/1945  Today's Date: 10/07/2021 OT Individual Time: 1035-1120 OT Individual Time Calculation (min): 45 min    Short Term Goals: Week 1:  OT Short Term Goal 1 (Week 1): Pt will complete LB ADL at EOB  with min A for balance using AE OT Short Term Goal 2 (Week 1): Pt will complete stand pivot transfer from EOB to BSC with MinA  at 90%safe OT Short Term Goal 3 (Week 1): Pt will complete grooming activity at sink at w/c LOF with s/u only at 90% safe OT Short Term Goal 4 (Week 1): Pt will don pants with ModA using AE for addiitonal reach at chair LOF and 90%safe OT Short Term Goal 5 (Week 1): The pt will tolerate greater than 25 mins of activity at 90% safe with 2-3 rest breaks.     Skilled Therapeutic Interventions/Progress Updates:    Pt received in w/c ready for therapy. He finished brushing his teeth at sink.  Pt self propelled partway to the gym.  He worked on endurance and arm exercises on arm bike at resistance 5 for 2 min, 1 min rest break for 5 sets. BUE AROM using hula hoop for sh flexion and overhead reach.  Self propelled halfway back to his room. Resting in room with alarm on and all needs met.   Therapy Documentation Precautions:  Precautions Precautions: Fall Precaution Comments: monitor O2 Restrictions Weight Bearing Restrictions: No    Vital Signs: Therapy Vitals Temp: 98 F (36.7 C) Pulse Rate: 67 Resp: 17 BP: 101/62 Patient Position (if appropriate): Sitting Oxygen Therapy SpO2: 100 % O2 Device: Room Air  Pain: no c.o pain    ADL: ADL Eating: Independent Where Assessed-Eating: Bed level Grooming: Independent Where Assessed-Grooming: Sitting at sink Upper Body Bathing: Setup Where Assessed-Upper Body Bathing: Shower Lower Body Bathing: Supervision/safety Where Assessed-Lower Body Bathing: Shower Upper Body Dressing: Independent Where  Assessed-Upper Body Dressing: Wheelchair Lower Body Dressing: Supervision/safety Where Assessed-Lower Body Dressing: Wheelchair Toileting: Supervision/safety Where Assessed-Toileting: Toilet Toilet Transfer: Close supervision Toilet Transfer Method: Ambulating Toilet Transfer Equipment: Grab bars Tub/Shower Transfer: Other (comment) (n/a as pt will only use shower stall) Tub/Shower Transfer Method:  (based on observation) Walk-In Shower Transfer: Close supervision Walk-In Shower Transfer Method: Ambulating Walk-In Shower Equipment: Grab bars, Transfer tub bench       Therapy/Group: Individual Therapy  SAGUIER,JULIA 10/07/2021, 12:26 PM 

## 2021-10-07 NOTE — Progress Notes (Signed)
Physical Therapy Session Note  Patient Details  Name: Ricky Hayden MRN: [id number] Date of Birth: 27-Sep-1945  Today's Date: 10/07/2021 PT Individual Time: 0800-0826 PT Individual Time Calculation (min): 26 min   Short Term Goals: Week 1:  PT Short Term Goal 1 (Week 1): pt will transfer sit<>stand with LRAD and minA PT Short Term Goal 2 (Week 1): pt will transfer bed<>chair with LRAD and CGA PT Short Term Goal 3 (Week 1): pt will ambulate 68f with LRAD and min A  Skilled Therapeutic Interventions/Progress Updates:      Direct handoff of care from RT to start session - pt sitting in w/c and agreeable to PT tx. Reports mild L calf pain, unrated. No swelling or redness noted. Mobiltiy and rest breaks provided for pain management.   Transported to day room rehab gym for time management. Instructed in gait training 169fwith supervision and RW - primary gait deficits include decreased B foot clearance and decreased speed but no LOB or knee buckling observed, gait distance primarily limited by fatigue. Worked on sit<>Stand transitions, 1x5 with supervision to RW - attempted to work on isolating LE without using UE 's to push to stand but pt is reliant on UE for standing. Standing there-ex with CGA for safety - shldr press with 4# dumbbell, unilateral arm punches with 4#dumbbell - 1x10 each.   Returned to his room in w/c for time. Remained seated with chair alarm on, call bell in reach, all needs met.   Therapy Documentation Precautions:  Precautions Precautions: Fall Precaution Comments: monitor O2 Restrictions Weight Bearing Restrictions: No General:    Therapy/Group: Individual Therapy  [place] Gustafson P Lanier Felty 10/07/2021, 7:26 AM

## 2021-10-07 NOTE — Progress Notes (Signed)
Occupational Therapy Session Note  Patient Details  Name: Ricky Hayden MRN: [id number] Date of Birth: 12-Feb-1946  Today's Date: 10/07/2021 OT Individual Time: 2956-2130 OT Individual Time Calculation (min): 43 min    Short Term Goals: Week 1:  OT Short Term Goal 1 (Week 1): Pt will complete LB ADL at EOB  with min A for balance using AE OT Short Term Goal 2 (Week 1): Pt will complete stand pivot transfer from EOB to [facility] with MinA  at 90%safe OT Short Term Goal 3 (Week 1): Pt will complete grooming activity at sink at w/c LOF with s/u only at 90% safe OT Short Term Goal 4 (Week 1): Pt will don pants with ModA using AE for addiitonal reach at chair LOF and 90%safe OT Short Term Goal 5 (Week 1): The pt will tolerate greater than 25 mins of activity at 90% safe with 2-3 rest breaks.  Skilled Therapeutic Interventions/Progress Updates:    Patient received sitting up in wheelchair.  Patient agreeable to taking a shower.  Skilled OT intervention to focus on activity tolerance, functional mobility, dynamic sit / stand balance as related to simple self care skills.  Patient reliant on UE for sit to stand/ stand to sit transitions.  Patient paced himself throughout activity, and used good safety judgement while showering and dressing self.  Patient may benefit from use of reacher to don/doff LB clothing - difficulty breathing due to girth when reaching to feet - needs extra time for increased independence with LB dressing.  Patient left up in wheelchair with seat alarm in place and engaged, phone, nursing call bell and personal items within reach.    Therapy Documentation Precautions:  Precautions Precautions: Fall Precaution Comments: monitor O2 Restrictions Weight Bearing Restrictions: No   Pain:  No pain     Therapy/Group: Individual Therapy  Mariah Milling 10/07/2021, 12:15 PM

## 2021-10-08 LAB — GLUCOSE, CAPILLARY
Glucose-Capillary: 96 mg/dL (ref 70–99)
Glucose-Capillary: 79 mg/dL (ref 70–99)
Glucose-Capillary: 96 mg/dL (ref 70–99)
Glucose-Capillary: 113 mg/dL — ABNORMAL HIGH (ref 70–99)

## 2021-10-08 LAB — PROTIME-INR
INR: 2.6 — ABNORMAL HIGH (ref 0.8–1.2)
Prothrombin Time: 28 seconds — ABNORMAL HIGH (ref 11.4–15.2)

## 2021-10-08 MED ORDER — WARFARIN SODIUM 5 MG PO TABS
5.0000 mg | ORAL_TABLET | Freq: Once | ORAL | Status: AC
  Administered 2021-10-08: 5 mg via ORAL
  Filled 2021-10-08 (×2): qty 1

## 2021-10-08 NOTE — Progress Notes (Signed)
Physical Therapy Session Note  Patient Details  Name: Ricky Hayden MRN: [id number] Date of Birth: [birth year]  Today's Date: 10/08/2021 PT Individual Time: 2952-8413 and 1333- 1432 PT Individual Time Calculation (min): 27 min and 59 min  Short Term Goals: Week 1:  PT Short Term Goal 1 (Week 1): pt will transfer sit<>stand with LRAD and minA PT Short Term Goal 2 (Week 1): pt will transfer bed<>chair with LRAD and CGA PT Short Term Goal 3 (Week 1): pt will ambulate 2f with LRAD and min A  Skilled Therapeutic Interventions/Progress Updates: Pt presented in w/c agreeable to therapy. Pt denies pain at start of session c/o some calf pain which pt states has been present for past few days. TTP at ms belly at gastrocs, but not warm to touch. Pt states only occurred when changed to rollator and had a few periods where he was walking faster then anticipated due to change over from RW. Pt requesting to use toilet prior to leaving room. PTA had brought in smaller sized rollator more similar to pt's one at home. Pt was able to trial to use to bathroom demonstrating good safety however upon exiting bathroom noted that break would not release on L rear wheel therefore changed back to bari-rollator (toilet transfers and toileting performed at distant supervision level). Pt then participated in ambulation in 4W for endurance and general conditioning. Pt ambulated distances between 100-1521fwith supervision with pt taking seated rest breaks as needed. Education provided to pt regarding energy conservation particularly in comminity environment. Upon completion of ambulation back in room pt returned to w/c with supervision. Pt left in w/c at end of session with call bell within reach and current needs met.   Tx2: Pt presented in bed agreeable to therapy. Pt denies pain during session. Session focused on functional mobility and increasing endurance in preparation for d/c. Pt performed bed mobility with use of bed  features and distant supervision. Ambulated to rehab gym >20097fith rollator and supervision with x 1 seated rest break to allow for recovery. Pt with minimal c/o discomfort when cued to ambulate more slowly. In rehab gym pt participated in step up to 5in step with rollator. Pt was able to manage rollator with supervision and place appropriately on curb step. Pt was able to perform x 4 with rest breaks. Continued to provide education on energy conservation upon d/c and setting up home for safety when re-entering home (keeping pet in separate room until settled). PTA then discussed with pt vehicle anticipates pt will use of d/c. Pt thinks will be a SUV of some sort, son has a JeeActord he has an IzuMusicianer pt neither vehicle has a runner that pt can use to help boost up. Pt did indicate that his vehicle is a bit lower than his son's. PTA suggested that son try to use that vs Jeep as may be easier to transfer. Pt ambulated to ortho gym supervision and after seated rest attempted car transfer to 29in seat height. Pt required modA to attempt as pt's hips were not high enough to safely land on seat. PTA placed 4in step under pt to simulate a stepstool that pt was able to complete transfer with min almost CGA as pt was able to boost self back in seat when block in place. After discussion with pt he believes he can get son to bring in something to assist in addition to possibly using [employer] as pt realized increased difficulty in getting into taller vehicle. Pt  then ambulated partial distance back to room with x 1 seated rest (~172f total) and PTA pushed pt in rollator remaining distance back to room. In room pt performed ambulatory transfer to w/c per pt's request performed at supervision level. Pt left in w/c at end of session with call bell within reach and needs met.      Therapy Documentation Precautions:  Precautions Precautions: Fall Precaution Comments: monitor O2 Restrictions Weight Bearing Restrictions:  No General:   Vital Signs: Therapy Vitals Pulse Rate: 64 BP: 132/86 Pain: Pain Assessment Pain Score: 0-No pain Mobility:   Locomotion :    Trunk/Postural Assessment :    Balance:   Exercises:   Other Treatments:      Therapy/Group: Individual Therapy  Hale Chalfin 10/08/2021, 11:56 AM

## 2021-10-08 NOTE — Patient Care Conference (Signed)
Inpatient RehabilitationTeam Conference and Plan of Care Update Date: 10/08/2021   Time: 11:58 AM    Patient Name: Ricky Hayden      Medical Record Number: [id number]  Date of Birth: April 04, 1946 Sex: Male         Room/Bed: 4W24C/4W24C-01 Payor Info: Payor: Milo / Plan: [employer] / Product Type: *No Product type* /    Admit Date/Time:  10/01/2021  1:45 PM  Primary Diagnosis:  [facility] Problems: Principal Problem:   Debility    Expected Discharge Date: Expected Discharge Date: 10/10/21  Team Members Present: Physician leading conference: Dr. Jennye Boroughs Social Worker Present: Ovidio Kin, LCSW Nurse Present: Dorien Chihuahua, RN PT Present: Leavy Cella, PT;Rosita Dechalus, PTA OT Present: Meriel Pica, OT SLP Present: Weston Anna, SLP PPS Coordinator present : Ileana Ladd, PT     Current Status/Progress Goal Weekly Team Focus  Bowel/Bladder   Pt is continent of bowel/bladder  Pt will remain continent of bowel/bladder  Will assess qshift and PRN   Swallow/Nutrition/ Hydration   Regular textures with thin liquids, Intermittent supervision-Mod I  Mod I  tolerance of current diet   ADL's   supervision overall  mod I toileting, supervision LB dressing, bathing; min A shower transfer  ADL training, functional mobility, strength, pt education   Mobility   supervision to CGA overall for gait and transfers  mod I to supervision overall for transfers and gait; CGA for 1 step for home entry without rails      Communication             Safety/Cognition/ Behavioral Observations  Supervision-Min A  Supervision  mildly complex problem solving, recall with use of strategies, selective attention, emergent awareness   Pain   Pt is currently pain free  Pt will remain pain free  Will assess qshift and PRN   Skin   Pt's skin is intact  Pt's skin will remain intact  Will assess qshift and PRN     Discharge Planning:  HOme with  children staying at night and on the weekends-intermittent during the day due to all work. Doing better on this admission   Team Discussion: Patient is doing well ; encephalopathy improved post cardioversion and treatment of UTI. Insulin wean completed; return to home med for DM.  Patient on target to meet rehab goals: yes, currently needs mod I assist overall. Able to ambulate up to 100'-150' with distant supervision. Tolerating a regular thin diet well. Needs supervision - min assist for recall, problem solving, awareness, etc.   *See Care Plan and progress notes for long and short-term goals.   Revisions to Treatment Plan:  N/A   Teaching Needs: Safety, medications, transfers, etc  Current Barriers to Discharge: Decreased caregiver support  Possible Resolutions to Barriers: Family education HH follow up recommended No DME recommended; has a Scientific laboratory technician Summary Current Status: Debility s/p septic shock, Afib, prediabetes, UTI, BPH, CKD, Constipation, anemia  Barriers to Discharge: Home enviroment access/layout;Medical stability;Incontinence  Barriers to Discharge Comments: Debility s/p septic shock, Afib, prediabetes, UTI, BPH, CKD, Constipation, anemia Possible Resolutions to [employer] Focus: monitor labs, abx for UTI, monitor bladder and bowel function, adjust DM medications   Continued Need for Acute Rehabilitation Level of Care: The patient requires daily medical management by a physician with specialized training in physical medicine and rehabilitation for the following reasons: Direction of a multidisciplinary physical rehabilitation program to maximize functional independence : Yes Medical management of patient  stability for increased activity during participation in an intensive rehabilitation regime.: Yes Analysis of laboratory values and/or radiology reports with any subsequent need for medication adjustment and/or medical intervention. : Yes   I attest  that I was present, lead the team conference, and concur with the assessment and plan of the team.   Dorien Chihuahua B 10/08/2021, 2:59 PM

## 2021-10-08 NOTE — Progress Notes (Signed)
Patient ID: Ricky Hayden, male   DOB: [birth year], 76 y.o.   MRN: [id number]  Met with pt and spoke with daughter-Ricky Hayden via telephone to update regarding team conference goals of supervision-mod/I level and discharge date of 6/9. Both are happy about this and daughter is wanting to [facility] for [facility] care due to all of the children work and he will be alone during this time. Will reach out to [place] liaison to ask this and email script for home health and wheelchair family is requesting. Daughter wants to make sure his medications are set since feel every time they have an issues obtaining them. Have reached out to the PA-Pam regarding this concern. Will continue to work on discharge needs.

## 2021-10-08 NOTE — Progress Notes (Signed)
PROGRESS NOTE   Subjective/Complaints: Working with therapy this AM. Bladder function improved. He would like to go home Friday.    Review of Systems  Constitutional:  Negative for chills and fever.  Respiratory:  Negative for cough and shortness of breath.   Cardiovascular:  Negative for chest pain and palpitations.  Gastrointestinal:  Negative for abdominal pain, nausea and vomiting.  Genitourinary:  Negative for dysuria, flank pain and urgency.  Neurological:  Negative for focal weakness.  +low back pain- stable   Objective:   No results found. Recent Labs    10/06/21 0621  WBC 4.0  HGB 10.0*  HCT 31.0*  PLT 101*    Recent Labs    10/07/21 0535  NA 140  K 4.3  CL 107  CO2 28  GLUCOSE 94  BUN 10  CREATININE 1.24  CALCIUM 9.0     Intake/Output Summary (Last 24 hours) at 10/08/2021 1002 Last data filed at 10/08/2021 9417 Gross per 24 hour  Intake 712 ml  Output 0 ml  Net 712 ml          Physical Exam: Vital Signs Blood pressure 132/86, pulse 64, temperature 97.8 F (36.6 C), resp. rate 19, weight 99.3 kg, SpO2 95 %.  General: NAD. Sitting in chair HEENT: Healed crani incision, conjugate gaze, oral mucosa pink and moist, working with therapy Neck: Supple without JVD or lymphadenopathy Heart: Reg rate and rhythm. + murmur Chest: CTA bilaterally without wheezes, rales, or rhonchi; no distress, on RA, deconditioned Abdomen: Soft, non-tender, non-distended, bowel sounds positive. Extremities: No clubbing, cyanosis, or edema. Pulses are 2+ Psych: Very pleasant Skin: warm and dry, no breakdown noted Neuro:  Follows commands, Answer questions, able to repeat 3 words, makes eye contact. Sensation intact in all 4 extremities although reports altered sensation digits 4+5 both hands,   intrensic hand muscles atrophy noted b/l Musculoskeletal: No joint swelling or tenderness 5/5 b/l UE strength 5/5 in b/l  LE with exception of Hip flexion 4/5 bilaterally     Assessment/Plan: 1. Functional deficits which require 3+ hours per day of interdisciplinary therapy in a comprehensive inpatient rehab setting. Physiatrist is providing close team supervision and 24 hour management of active medical problems listed below. Physiatrist and rehab team continue to assess barriers to discharge/monitor patient progress toward functional and medical goals  Care Tool:  Bathing    Body parts bathed by patient: Right arm, Left arm, Chest, Abdomen, Front perineal area, Buttocks, Right upper leg, Left upper leg, Right lower leg, Left lower leg, Face   Body parts bathed by helper: Buttocks     Bathing assist Assist Level: Supervision/Verbal cueing     Upper Body Dressing/Undressing Upper body dressing   What is the patient wearing?: Pull over shirt    Upper body assist Assist Level: Independent    Lower Body Dressing/Undressing Lower body dressing      What is the patient wearing?: Underwear/pull up, Pants     Lower body assist Assist for lower body dressing: Moderate Assistance - Patient 50 - 74% (due to time restraints - needs rest breaks)     Toileting Toileting    Toileting assist Assist for toileting:  Moderate Assistance - Patient 50 - 74%     Transfers Chair/bed transfer  Transfers assist  Chair/bed transfer activity did not occur: Safety/medical concerns  Chair/bed transfer assist level: Supervision/Verbal cueing     Locomotion Ambulation   Ambulation assist   Ambulation activity did not occur: Safety/medical concerns (fatigue, weakness, pain)  Assist level: Contact Guard/Touching assist Assistive device: Rollator Max distance: 150'   Walk 10 feet activity   Assist  Walk 10 feet activity did not occur: Safety/medical concerns (fatigue, weakness, pain)  Assist level: Supervision/Verbal cueing Assistive device: Rollator   Walk 50 feet activity   Assist Walk 50  feet with 2 turns activity did not occur: Safety/medical concerns (fatigue, weakness, pain)  Assist level: Supervision/Verbal cueing Assistive device: Rollator    Walk 150 feet activity   Assist Walk 150 feet activity did not occur: Safety/medical concerns (fatigue, weakness, pain)  Assist level: Contact Guard/Touching assist Assistive device: Rollator    Walk 10 feet on uneven surface  activity   Assist Walk 10 feet on uneven surfaces activity did not occur: Safety/medical concerns (fatigue, weakness, pain)         Wheelchair     Assist Is the patient using a wheelchair?: Yes Type of Wheelchair: Manual    Wheelchair assist level: Supervision/Verbal cueing Max wheelchair distance: 81ft    Wheelchair 50 feet with 2 turns activity    Assist        Assist Level: Supervision/Verbal cueing   Wheelchair 150 feet activity     Assist      Assist Level: Total Assistance - Patient < 25% (pt propels 50 ft)   Blood pressure 132/86, pulse 64, temperature 97.8 F (36.6 C), resp. rate 19, weight 99.3 kg, SpO2 95 %.     Medical Problem List and Plan: 1. Functional deficits secondary to Debility due to septic shock              -patient may shower             -ELOS/Goals: 10-14 days, mod I PT/OT/SLP  -Continue CIR, PT,OT,SLP  -Completed 165 ft gait training  -Team conference today 2.  Antithrombotics: -DVT/anticoagulation:  Pharmaceutical: Coumadin and Lovenox             -antiplatelet therapy: NA 3. Chronic low back pain:  Tylenol prn. Tramadol added prn 4. Mood: LCSW to follow for evaluation and support.              -antipsychotic agents: N/A 5. Neuropsych: This patient is capable of making decisions on his own behalf. 6. Skin/Wound Care: Routine pressure relief measures.  7. Fluids/Electrolytes/Nutrition:  Monitor I/O. Check CMET in am. 8. CAD/ICM s/p BiV: Monitor weights daily and for symptoms with increase in activity.             --continue  Coreg, Jardiance, pravastatin and Entresto.              --low salt HH diet.              -Denies chest pain  -Cardiology signed off 6/1, appreciate assistance 9. A fib w/RVR: HR controlled on amiodarone and Coreg.             --monitor for symptoms with increase in activity              -Denies palpitations  -Continue coumadin, pharmacy following, INR a little elevated today at 3.6 on 6/5 10 . Pre-diabetes: Hgb A1C- 5.7. Monitor BS ac/hs--should improve with  d/c of prednisone today             --continue insulin glargline but wean to off as BS improve.   -6/1Glucose well controlled this AM, Will decrease to 5 units  -6/2 Decrease insulin to 3 units  -6/5 DC insulin  -6/7 glucose well controlled off insulin 11. CKD III: Avoid nephrotoxic meds. Recheck CMET in am.  -Cr 1.18 5/31 -6/5 Cr 1.24 today 12. BPH: Will monitor voiding with bladder scan.  -bladder scans do not indicate retention 13. 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   -no recent seizures reported 14. COPD: Pulmonary hygiene. Completes prednisone 40 mg X 5 days to day. --Continue Incruse daily.  -Continue O2 [place] if needed, currently off -Ot sat 95 on room air, continue to follow 15. Constipation             -Increase miralax to BID  Continue sorbitol, Dulcolax supp prn 16. UTI, he had Urinary Incontinence, reports going of since initial admission  -U/A ordered, +wbc bacteria, Will order keflex for UTI. UC reviewed and shows 20,000 colonies of Proteus. F/u sensitivities.   -pt reports incontinence is  improved, check additional bladder scans  -6/7 completing abx today 17. Anemia  -HBG stable at 10, continue to monitor on routine labs    LOS: 7 days A FACE TO FACE EVALUATION WAS PERFORMED  Jennye Boroughs 10/08/2021, 10:02 AM

## 2021-10-08 NOTE — Progress Notes (Signed)
Speech Language Pathology Weekly Progress and Session Note  Patient Details  Name: Ricky Hayden MRN: [id number] Date of Birth: 05-02-1946  Beginning of progress report period: Oct 01, 2021 End of progress report period: October 08, 2021  Today's Date: 10/08/2021 SLP Individual Time: 1025 to 1105 Total Time: 40 minutes    Short Term Goals: Week 1: SLP Short Term Goal 1 (Week 1): Pt will demonstrate selective attention during functional tasks with supervision A verbal cues for redirection in 30-45 minute intervals. SLP Short Term Goal 2 (Week 1): Pt will demonstrated recall of daily, novel information with use of min A verbla cues for external/internal aids. SLP Short Term Goal 2 - Progress (Week 1): Met SLP Short Term Goal 3 (Week 1): Pt will demonstrate functional basic-mildly complex problem solving skills with min A verbal cues. SLP Short Term Goal 3 - Progress (Week 1): Met SLP Short Term Goal 4 (Week 1): Pt will self-monitor and self-correct functional errors with min A verbal cues. SLP Short Term Goal 4 - Progress (Week 1): Met SLP Short Term Goal 5 (Week 1): Pt will tolerate regular textures and thin liquids with supervision A verbal cues for use of swallow stratgeies. SLP Short Term Goal 5 - Progress (Week 1): Met    New Short Term Goals: Week 2: SLP Short Term Goal 1 (Week 2): STG's = LTG's due to ELOS  Weekly Progress Updates: Pt has demonstrated functional gains this past week as evident by meeting 5 out of 5 short-term goals set during initial evaluation. Pt continues to benefit from Sup A to Min A for completion of more complex iADL tasks; therefore, recommend 24/7 supervision and assistance at time of discharge. Pt education provided re: these recommendations. Per chart review, pt appears to be tolerating regular diet textures with thin liquids with no clinical indicators concerning for complications; VSS. May benefit from follow-up ST intervention at next venue of care [facility] vs  OP).   Intensity: Minumum of 1-2 x/day, 30 to 90 minutes Frequency: 3 to 5 out of 7 days Duration/Length of Stay: ELOS = 6/9 Treatment/Interventions: Cognitive remediation/compensation;Environmental controls;Internal/external aids;Patient/family education;Functional tasks;Dysphagia/aspiration precaution training;Cueing hierarchy   Daily Session Skilled Therapeutic Interventions: Pt seen this date for skilled ST intervention targeting cognitive-linguistic goals outlined above. Pt received awake/alert and OOB in w/c. Pleasant and cooperative throughout. Agreeable to ST intervention. Pt completed pill organizer task with Sup A for double checking for errors and repetition of verbal directions re: pill placement (take once daily with meals). Demonstrated functional recall with Sup A and selective attention with Mod I. Pt left in w/c with all safety measures activated. Call bell reviewed and within reach and all immediate needs met. Continue per current ST POC.     Pain No pain reported.  Therapy/Group: Individual Therapy  Ingeborg Fite A Noralee Dutko 10/08/2021, 10:00 PM

## 2021-10-08 NOTE — Progress Notes (Signed)
Occupational Therapy Session Note  Patient Details  Name: Ricky Hayden MRN: [id number] Date of Birth: [birth year]  Today's Date: 10/08/2021 OT Individual Time: 0830-0930 OT Individual Time Calculation (min): 60 min    Short Term Goals: Week 1:  OT Short Term Goal 1 (Week 1): Pt will complete LB ADL at EOB  with min A for balance using AE OT Short Term Goal 2 (Week 1): Pt will complete stand pivot transfer from EOB to [facility] with MinA  at 90%safe OT Short Term Goal 3 (Week 1): Pt will complete grooming activity at sink at w/c LOF with s/u only at 90% safe OT Short Term Goal 4 (Week 1): Pt will don pants with ModA using AE for addiitonal reach at chair LOF and 90%safe OT Short Term Goal 5 (Week 1): The pt will tolerate greater than 25 mins of activity at 90% safe with 2-3 rest breaks.  Skilled Therapeutic Interventions/Progress Updates:    Pt received in w/c ready for the day. He declined the need for a shower today as he had showered yesterday.  He was agreeable to doing one tomorrow. He was agreeable to practicing toilet transfers, toileting and LB dressing even though he did not need to do those things at the moment so we could ensure he could reach mod I by discharge.  Pt ambulated in and out of the bathroom with rollator with distant supervision.  "Toileted" in a simulated manner with mod I.  Pt can reach to cleanse himself. He then stood at sink to brush teeth and sat at EOB.  He stated he at home he always sits on his bed to dress.  Doffed pants and nonslip socks and redonned using reacher, sock aid, and long shoe horn to help with doffing socks safely and with no A.  He has all of that equipment at home.  He usually uses his sketcher sneakers but does not have here. He stated he also has grab bars in bathroom, shower seat, hospital bed type of bed with elevation features, lift chair and rollator.  His daughters have been doing his medication management for the last 2 years.  He then worked  on UE strength with med red therabands focusing upper back strength for improved posture.  Sit to stands with good balance. Pt is ready for discharge soon and hoping to go home on Friday.   Pt resting in w/c with all needs met.   Therapy Documentation Precautions:  Precautions Precautions: Fall Precaution Comments: monitor O2 Restrictions Weight Bearing Restrictions: No   Pain: Pain Assessment Pain Score: 0-No pain ADL: ADL Eating: Independent Where Assessed-Eating: Bed level Grooming: Independent Where Assessed-Grooming: Sitting at sink Upper Body Bathing: Setup Where Assessed-Upper Body Bathing: Shower Lower Body Bathing: Supervision/safety Where Assessed-Lower Body Bathing: Shower Upper Body Dressing: Independent Where Assessed-Upper Body Dressing: Wheelchair Lower Body Dressing: Supervision/safety Where Assessed-Lower Body Dressing: Wheelchair Toileting: Supervision/safety Where Assessed-Toileting: Glass blower/designer: Close supervision Toilet Transfer Method: Counselling psychologist: Energy manager: Other (comment) (n/a as pt will only use shower stall) Tub/Shower Transfer Method:  (based on observation) Social research officer, government: Close supervision Social research officer, government Method: Heritage manager: Grab bars, Transfer tub bench   Therapy/Group: Individual Therapy  [place] 10/08/2021, 8:59 AM

## 2021-10-08 NOTE — Progress Notes (Signed)
[place] for Warfarin and Enoxaparin Indication: atrial fibrillation  Allergies  Allergen Reactions   Calcium Channel Blockers Other (See Comments)    Came to hospital in 1995-caused chest pain     Patient Measurements: Weight: 99.3 kg (218 lb 14.7 oz)  Vital Signs: Temp: 97.8 F (36.6 C) (06/07 0422) BP: 129/85 (06/07 0422) Pulse Rate: 61 (06/07 0422)  Labs: Recent Labs    10/06/21 0621 10/07/21 0535 10/08/21 0629  HGB 10.0*  --   --   HCT 31.0*  --   --   PLT 101*  --   --   LABPROT 36.0* 29.3* 28.0*  INR 3.6* 2.8* 2.6*  CREATININE  --  1.24  --     Estimated Creatinine Clearance: 56 mL/min (by C-G formula based on SCr of 1.24 mg/dL).   Assessment: 76 year old male with new PAF. Patient recently had TEE/DCCV on 5/26. Patient started on enoxaparin and warfarin bridge on 5/30. Due to interaction with phenobarbital ([place] anticoagulation/drug interaction document), DOACs should not be used. Interaction discussed with cardiology on 5/31. Cardiology recommending to continue with warfarin therapy.   Significant Drug Interactions - amiodarone: may increase INR - phenobarbital: may decrease INR  6/05 CBC stable, no signs bleeding reported. INR therapeutic x2 days, now therapeutic at 2.6. Sensitive to warfarin adjustments. INR now stabilizing. Will continue reduced warfarin dose for now and monitor closely.   Goal of Therapy:  INR 2-3 Monitor platelets by anticoagulation protocol: Yes   Plan:  Give warfarin 5 mg PO x1 dose Check INR daily while on warfarin Continue to monitor H&H and platelets    Thank you for allowing pharmacy to be a part of this patient's care.  Ardyth Harps, PharmD Clinical Pharmacist

## 2021-10-09 LAB — GLUCOSE, CAPILLARY
Glucose-Capillary: 97 mg/dL (ref 70–99)
Glucose-Capillary: 92 mg/dL (ref 70–99)
Glucose-Capillary: 103 mg/dL — ABNORMAL HIGH (ref 70–99)
Glucose-Capillary: 110 mg/dL — ABNORMAL HIGH (ref 70–99)

## 2021-10-09 LAB — PROTIME-INR
INR: 3.1 — ABNORMAL HIGH (ref 0.8–1.2)
Prothrombin Time: 31.8 seconds — ABNORMAL HIGH (ref 11.4–15.2)

## 2021-10-09 MED ORDER — WARFARIN SODIUM 2.5 MG PO TABS
2.5000 mg | ORAL_TABLET | Freq: Once | ORAL | Status: AC
  Administered 2021-10-09: 2.5 mg via ORAL
  Filled 2021-10-09: qty 1

## 2021-10-09 NOTE — Progress Notes (Incomplete)
Inpatient Rehabilitation Discharge Medication Review by a Pharmacist  A complete drug regimen review was completed for this patient to identify any potential clinically significant medication issues.  High Risk Drug Classes Is patient taking? Indication by Medication  Antipsychotic {Receiving?:26196}   Anticoagulant {Receiving?:26196}   Antibiotic {Receiving?:26196}   Opioid {Receiving?:26196}   Antiplatelet {Receiving?:26196}   Hypoglycemics/insulin {Yes or No?:26198}   Vasoactive Medication {Receiving?:26196}   Chemotherapy {Receiving Chemo?:26197}   Other {Yes or No?:26198}      Type of Medication Issue Identified Description of Issue Recommendation(s)  Drug Interaction(s) (clinically significant)     Duplicate Therapy     Allergy     No Medication Administration End Date     Incorrect Dose     Additional Drug Therapy Needed     Significant med changes from prior encounter (inform family/care partners about these prior to discharge).    Other       Clinically significant medication issues were identified that warrant physician communication and completion of prescribed/recommended actions by midnight of the next day:  {Yes or No?:26198}  Name of provider notified for urgent issues identified: ***   Provider Method of Notification: ***    Pharmacist comments: *** Warfarin: ***   Time spent performing this drug regimen review (minutes): 20   Thank you for allowing pharmacy to be a part of this patient's care.  Ardyth Harps, PharmD Clinical Pharmacist

## 2021-10-09 NOTE — Progress Notes (Signed)
Occupational Therapy Discharge Summary  Patient Details  Name: Ricky Hayden MRN: [id number] Date of Birth: [birth year]   Patient has met 9 of 9 long term goals due to improved activity tolerance, improved balance, postural control, and ability to compensate for deficits.   Patient to discharge at overall Modified Independent level with dressing and toileting and supervision with bathing and shower stall transfer.   Patient's care partner is independent to provide the necessary physical and cognitive assistance at discharge.  His son was present for family education. His family will provide 24/7 supervision for the first 3 days to ensure he is transitioning to home well.   Reasons goals not met: n/a  Recommendation:  Patient will benefit from ongoing skilled OT services in home health setting to continue to advance functional skills in the area of iADL.  Equipment: No equipment provided  Reasons for discharge: treatment goals met  Patient/family agrees with progress made and goals achieved: Yes  OT Discharge Precautions/Restrictions  Precautions Precautions: Fall Restrictions Weight Bearing Restrictions: No  ADL ADL Eating: Independent Where Assessed-Eating: Bed level Grooming: Independent Where Assessed-Grooming: Standing at sink Upper Body Bathing: Setup Where Assessed-Upper Body Bathing: Shower Lower Body Bathing: Setup Where Assessed-Lower Body Bathing: Shower Upper Body Dressing: Independent Where Assessed-Upper Body Dressing: Edge of bed Lower Body Dressing: Modified independent Where Assessed-Lower Body Dressing: Edge of bed Toileting: Modified independent Where Assessed-Toileting: Glass blower/designer: Diplomatic Services operational officer Method: Counselling psychologist: Energy manager: Close supervision Social research officer, government Method: Heritage manager: Grab bars, Gaffer Baseline  Vision/History: 1 Wears glasses Patient Visual Report: No change from baseline Perception  Perception: Within Functional Limits Praxis Praxis: Intact Cognition Cognition Overall Cognitive Status: Within Functional Limits for tasks assessed Orientation Level: Person;Place;Situation Person: Oriented Place: Oriented Situation: Oriented Memory: Appears intact Awareness: Appears intact Problem Solving: Appears intact Safety/Judgment: Appears intact Comments: slower processing but is functional with basic tasks Brief Interview for Mental Status (BIMS) Repetition of Three Words (First Attempt): 3 Temporal Orientation: Year: Correct Temporal Orientation: Month: Accurate within 5 days Temporal Orientation: Day: Correct Recall: "Sock": Yes, no cue required Recall: "Blue": Yes, no cue required Recall: "Bed": Yes, no cue required BIMS Summary Score: 15 Sensation Sensation Light Touch: Appears Intact Hot/Cold: Appears Intact Proprioception: Appears Intact Stereognosis: Appears Intact Coordination Gross Motor Movements are Fluid and Coordinated: Yes Fine Motor Movements are Fluid and Coordinated: Yes Finger Nose Finger Test: [facility] bilaterally Motor  Motor Motor: Within Functional Limits Mobility  Bed Mobility Rolling Left: Independent with assistive device Left Sidelying to Sit: Independent with assistive device Supine to Sit: Independent with assistive device Sit to Supine: Independent with assistive device Transfers Sit to Stand: Independent with assistive device  Transfers and home ambulation MOD I with rollator Trunk/Postural Assessment  Postural Control Postural Control: Within Functional Limits  Balance Static Sitting Balance Static Sitting - Level of Assistance: 7: Independent Dynamic Sitting Balance Dynamic Sitting - Level of Assistance: 7: Independent Static Standing Balance Static Standing - Level of Assistance: 7: Independent Dynamic Standing Balance Dynamic  Standing - Level of Assistance: 6: Modified independent (Device/Increase time) Extremity/Trunk Assessment RUE Assessment RUE Assessment: Within Functional Limits LUE Assessment LUE Assessment: Within Functional Limits   Ricky Hayden 10/09/2021, 10:47 AM

## 2021-10-09 NOTE — Progress Notes (Signed)
Physical Therapy Discharge Summary  Patient Details  Name: Ricky Hayden MRN: [id number] Date of Birth: 12/08/1945  Today's Date: 10/09/2021      Patient has met 10 of 10 long term goals due to improved activity tolerance, improved balance, improved postural control, increased strength, improved attention, improved awareness, and improved coordination.  Patient to discharge at an ambulatory level Modified Independent.   Patient's care partner  will provide supervision for first 3 days then will provide intermittent assist   to provide the necessary physical and cognitive assistance at discharge.  Reasons goals not met: N/A all goals met  Recommendation:  Patient will benefit from ongoing skilled PT services in home health setting to continue to advance safe functional mobility, address ongoing impairments in strength, balance, endurance, functional mobility, and minimize fall risk.  Equipment: RW; pt already owns rollator  Reasons for discharge: treatment goals met and discharge from hospital  Patient/family agrees with progress made and goals achieved: Yes  PT Discharge Precautions/Restrictions Precautions Precautions: Fall Restrictions Weight Bearing Restrictions: No  Pain Pain Assessment Pain Scale: 0-10 Pain Score: 0-No pain Faces Pain Scale: Hurts a little bit Pain Location: Leg Pain Orientation: Left;Distal Pain Interference Pain Interference Pain Effect on Sleep: 1. Rarely or not at all Pain Interference with Therapy Activities: 0. Does not apply - I have not received rehabilitationtherapy in the past 5 days Pain Interference with Day-to-Day Activities: 1. Rarely or not at all Vision/Perception  Vision - History Ability to See in Adequate Light: 0 Adequate Perception Perception: Within Functional Limits Praxis Praxis: Intact  Cognition Overall Cognitive Status: Within Functional Limits for tasks assessed Arousal/Alertness: Awake/alert Orientation Level:  Oriented X4 Year: 2023 Month: June Memory: Appears intact Awareness: Appears intact Problem Solving: Appears intact Safety/Judgment: Appears intact Comments: slower processing but is functional with basic tasks Sensation Sensation Light Touch: Appears Intact Hot/Cold: Appears Intact Proprioception: Appears Intact Stereognosis: Appears Intact Coordination Gross Motor Movements are Fluid and Coordinated: Yes Fine Motor Movements are Fluid and Coordinated: Yes Finger Nose Finger Test: WFL bilaterally Motor  Motor Motor: Within Functional Limits  Mobility Bed Mobility Rolling Left: Independent with assistive device Left Sidelying to Sit: Independent with assistive device Supine to Sit: Independent with assistive device Sit to Supine: Independent with assistive device Transfers Transfers: Sit to Stand Sit to Stand: Independent with assistive device Stand to Sit: Independent with assistive device Stand Pivot Transfers: Independent with assistive device Transfer (Assistive device): Rollator Locomotion  Gait Ambulation: Yes Gait Assistance: Independent with assistive device Gait Distance (Feet): 200 Feet Assistive device: 4-wheeled walker Gait Gait: Yes Gait Pattern: Impaired Gait Pattern: Decreased step length - right;Decreased step length - left;Narrow base of support;Trunk flexed Gait velocity: decreased Stairs / Additional Locomotion Curb: Supervision/Verbal cueing Wheelchair Mobility Wheelchair Mobility: Yes Wheelchair Assistance: Supervision/Verbal cueing Wheelchair Propulsion: Both upper extremities Wheelchair Parts Management: Needs assistance Distance: 100ft  Trunk/Postural Assessment  Cervical Assessment Cervical Assessment: Exceptions to WFL (forward head) Thoracic Assessment Thoracic Assessment: Exceptions to WFL (rounded shoulders) Lumbar Assessment Lumbar Assessment: Exceptions to WFL (posterior pelvic tilt) Postural Control Postural Control: Within  Functional Limits  Balance Balance Balance Assessed: Yes Static Sitting Balance Static Sitting - Balance Support: Feet supported;Bilateral upper extremity supported Static Sitting - Level of Assistance: 7: Independent Dynamic Sitting Balance Dynamic Sitting - Balance Support: Feet supported;No upper extremity supported Dynamic Sitting - Level of Assistance: 7: Independent Static Standing Balance Static Standing - Balance Support: Bilateral upper extremity supported;During functional activity Static Standing - Level of Assistance: 6:   Modified independent (Device/Increase time);7: Independent Dynamic Standing Balance Dynamic Standing - Balance Support: Bilateral upper extremity supported;During functional activity Dynamic Standing - Level of Assistance: 6: Modified independent (Device/Increase time) Extremity Assessment  RUE Assessment RUE Assessment: Within Functional Limits LUE Assessment LUE Assessment: Within Functional Limits RLE Assessment RLE Assessment: Exceptions to [facility] General Strength Comments: grossly 4/5 RLE Strength Right Hip Flexion: 4/5 Right Hip ABduction: 4/5 Right Hip ADduction: 4/5 Right Knee Flexion: 4-/5 Right Knee Extension: 4/5 Right Ankle Dorsiflexion: 4/5 LLE Assessment LLE Assessment: Exceptions to [facility] General Strength Comments: grossly 4/5 LLE Strength Left Hip Flexion: 4-/5 Left Hip ABduction: 4/5 Left Hip ADduction: 4/5 Left Knee Flexion: 4-/5 Left Knee Extension: 4/5 Left Ankle Dorsiflexion: 4/5 Left Ankle Plantar Flexion: 4/5    Rosita DeChalus  Lars Masson, PT, DPT, CBIS  10/09/2021, 12:10 PM

## 2021-10-09 NOTE — Progress Notes (Signed)
Physical Therapy Session Note  Patient Details  Name: Ricky Hayden MRN: [id number] Date of Birth: 06-29-1945  Today's Date: 10/09/2021 PT Individual Time: 1105-1200 and 5364-6803 PT Individual Time Calculation (min): 55 min and 31 min  Short Term Goals: Week 1:  PT Short Term Goal 1 (Week 1): pt will transfer sit<>stand with LRAD and minA PT Short Term Goal 2 (Week 1): pt will transfer bed<>chair with LRAD and CGA PT Short Term Goal 3 (Week 1): pt will ambulate 63ft with LRAD and min A  Skilled Therapeutic Interventions/Progress Updates: Tx1: Pt presented in w/c agreeable to therapy. Pt denies pain at rest and intermittent pain in L calf during session but resolved with rest breaks. Pt propelled w/c ~175ft for general conditioning and BUE strengthening. Pt transported remaining distance to ortho gym and performed car transfer at compact SUV height with supervision and verbal cues to sit and turn legs into simulator vs trying to get LLE into vehicle from standing. Pt ambulated ~32ft without AD and CGA between simulator and w/c. Pt then ambulated up/down ramp and on compliant surface with rollator and supervision with pt demonstrating good safety with rollator. Pt then transported to ortho gym and performed 5in step with rollator with first two performed with CGA and latter 2 with supervision with pt able to manage rollator without assist. Pt also participated in ball toss with rebounder x 15 to incorporate dynamic balance. After brief rest pt ambulated back to room with rollator without rest break but pt ambulating with decreased gait speed. Pt returned to w/c at end of session and left with call bell within reach and needs met.   Tx2: Pt presented in w/c agreeable to therapy. Pt denies pain throughout session. Session focused on building endurance and general conditioning. Pt ambulated with rollator to day room mod I. Pt participated in NuStep L5 x 15 min maintaining between 35-45 SPM throughout. Pt  with mild DOE at end but resolved with seated rest. Pt ambulated back to room mod I and decreased self selected gait speed and returned to w/c at end of session. Pt left in w/c with call bell within reach and needs met.      Therapy Documentation Precautions:  Precautions Precautions: Fall Precaution Comments: monitor O2 Restrictions Weight Bearing Restrictions: No General:   Vital Signs:   Pain: Pain Assessment Pain Scale: 0-10 Pain Score: 0-No pain Faces Pain Scale: Hurts a little bit Pain Location: Leg Pain Orientation: Left;Distal Mobility: Bed Mobility Rolling Left: Independent with assistive device Left Sidelying to Sit: Independent with assistive device Supine to Sit: Independent with assistive device Sit to Supine: Independent with assistive device Transfers Transfers: Sit to Stand Sit to Stand: Independent with assistive device Stand to Sit: Independent with assistive device Stand Pivot Transfers: Independent with assistive device Transfer (Assistive device): Rollator Locomotion : Gait Ambulation: Yes Gait Assistance: Independent with assistive device Gait Distance (Feet): 200 Feet Assistive device: 4-wheeled walker Gait Gait: Yes Gait Pattern: Impaired Gait Pattern: Decreased step length - right;Decreased step length - left;Narrow base of support;Trunk flexed Gait velocity: decreased Stairs / Additional Locomotion Curb: Engineer, maintenance (IT) Mobility: Yes Wheelchair Assistance: Chartered loss adjuster: Both upper extremities Wheelchair Parts Management: Needs assistance Distance: 166ft  Trunk/Postural Assessment : Cervical Assessment Cervical Assessment: Exceptions to [facility] (forward head) Thoracic Assessment Thoracic Assessment: Exceptions to [facility] (rounded shoulders) Lumbar Assessment Lumbar Assessment: Exceptions to [facility] (posterior pelvic tilt) Postural Control Postural Control: Within  Functional Limits  Balance: Balance Balance Assessed:  Yes Static Sitting Balance Static Sitting - Balance Support: Feet supported;Bilateral upper extremity supported Static Sitting - Level of Assistance: 7: Independent Dynamic Sitting Balance Dynamic Sitting - Balance Support: Feet supported;No upper extremity supported Dynamic Sitting - Level of Assistance: 7: Independent Static Standing Balance Static Standing - Balance Support: Bilateral upper extremity supported;During functional activity Static Standing - Level of Assistance: 6: Modified independent (Device/Increase time);7: Independent Dynamic Standing Balance Dynamic Standing - Balance Support: Bilateral upper extremity supported;During functional activity Dynamic Standing - Level of Assistance: 6: Modified independent (Device/Increase time) Exercises:   Other Treatments:      Therapy/Group: Individual Therapy  Desa Rech 10/09/2021, 12:24 PM

## 2021-10-09 NOTE — Progress Notes (Signed)
Inpatient Rehabilitation Care Coordinator Discharge Note   Patient Details  Name: Ricky Hayden MRN: [id number] Date of Birth: February 12, 1946   Discharge location: HOME WITH CHILDREN PROVIDING INTERMITTENT ASSIST DAY TIME BUT ASSIST EVENINGS AND WEEKENDS  Length of Stay: 9 DAYS  Discharge activity level: SUPERVISION-MOD/I LEVEL  Home/community participation: ACTIVE  Patient response HE:RDEYCX Literacy - How often do you need to have someone help you when you read instructions, pamphlets, or other written material from your doctor or pharmacy?: Never  Patient response [id number]:YJEHUD Isolation - How often do you feel lonely or isolated from those around you?: Rarely  Services provided included: MD, RD, PT, OT, RN, CM, Pharmacy, SW  Financial Services:  Financial Services Utilized: [employer]  Choices offered to/list presented to: PT AND DAUGHTER  Follow-up services arranged:  Home Health, DME, Patient/Family request agency HH/DME Home Health Agency: [facility]- PT, OT RN AIDE    DME : [place] HH/DME Requested Agency: [facility] HAS USED THEM BEFORE ASKED FOR AIDE FOR DAYTIME WITH [facility] PLACED ON WAITING LIST FOR THIS.   Patient response to transportation need: Is the patient able to respond to transportation needs?: Yes In the past 12 months, has lack of transportation kept you from medical appointments or from getting medications?: No In the past 12 months, has lack of transportation kept you from meetings, work, or from getting things needed for daily living?: No    Comments (or additional information):PT DID VERY WELL ONCE HE RETURNED TO [place].   Patient/Family verbalized understanding of follow-up arrangements:  Yes  Individual responsible for coordination of the follow-up plan: [facility] [phone]  Confirmed correct DME delivered: Elease Hashimoto 10/09/2021    Syliva Mee,  Gardiner Rhyme

## 2021-10-09 NOTE — Progress Notes (Signed)
[place] for Warfarin and Enoxaparin Indication: atrial fibrillation  Allergies  Allergen Reactions   Calcium Channel Blockers Other (See Comments)    Came to hospital in 1995-caused chest pain     Patient Measurements: Weight: 99.4 kg (219 lb 2.2 oz)  Vital Signs: Temp: 97.7 F (36.5 C) (06/08 0334) BP: 118/73 (06/08 0334) Pulse Rate: 70 (06/08 0334)  Labs: Recent Labs    10/07/21 0535 10/08/21 0629 10/09/21 0542  LABPROT 29.3* 28.0* 31.8*  INR 2.8* 2.6* 3.1*  CREATININE 1.24  --   --     Estimated Creatinine Clearance: 56 mL/min (by C-G formula based on SCr of 1.24 mg/dL).   Assessment: 76 year old male with new PAF. Patient recently had TEE/DCCV on 5/26. Patient started on enoxaparin and warfarin bridge on 5/30. Due to interaction with phenobarbital ([name] anticoagulation/drug interaction document), DOACs should not be used. Interaction discussed with cardiology on 5/31. Cardiology recommending to continue with warfarin therapy.   Significant Drug Interactions - amiodarone: may increase INR - phenobarbital: may decrease INR  6/05 CBC stable, no signs bleeding reported. INR therapeutic x2 days, now slightly above goal at 3.1. Sensitive to warfarin adjustments. INR now stabilizing. Will continue reduced warfarin dose for now and monitor closely.   Goal of Therapy:  INR 2-3 Monitor platelets by anticoagulation protocol: Yes   Plan:  Give warfarin 2.5 mg PO x1 dose Check INR daily while on warfarin Continue to monitor H&H and platelets    Thank you for allowing pharmacy to be a part of this patient's care.  Ardyth Harps, PharmD Clinical Pharmacist

## 2021-10-09 NOTE — Progress Notes (Signed)
PROGRESS NOTE   Subjective/Complaints: Excited to go home tomorrow. No new concerns.   Review of Systems  Constitutional:  Negative for chills and fever.  Respiratory:  Negative for cough and shortness of breath.   Cardiovascular:  Negative for chest pain and palpitations.  Gastrointestinal:  Negative for abdominal pain, nausea and vomiting.  Genitourinary:  Negative for dysuria.  Neurological:  Negative for focal weakness.  Psychiatric/Behavioral:  Negative for depression.    +low back pain- stable   Objective:   No results found. No results for input(s): "WBC", "HGB", "HCT", "PLT" in the last 72 hours.  Recent Labs    10/07/21 0535  NA 140  K 4.3  CL 107  CO2 28  GLUCOSE 94  BUN 10  CREATININE 1.24  CALCIUM 9.0     Intake/Output Summary (Last 24 hours) at 10/09/2021 0755 Last data filed at 10/08/2021 2125 Gross per 24 hour  Intake 480 ml  Output 0 ml  Net 480 ml          Physical Exam: Vital Signs Blood pressure 118/73, pulse 70, temperature 97.7 F (36.5 C), resp. rate 18, weight 99.4 kg, SpO2 92 %.  General: NAD. Sitting in chair HEENT: Healed crani incision, conjugate gaze, oral mucosa pink and moist, Sitting in chair Neck: Supple without JVD or lymphadenopathy Heart: Reg rate and rhythm. + murmur Chest: CTA bilaterally without wheezes, rales, or rhonchi; no distress, on RA Abdomen: Soft, non-tender, non-distended, bowel sounds positive. Extremities: No clubbing, cyanosis, or edema. Pulses are 2+ Psych: Very pleasant, appropriate Skin: warm and dry, no breakdown noted Neuro:  Follows commands, Answer questions, able to repeat 3 words, makes eye contact. Sensation intact in all 4 extremities although reports altered sensation digits 4+5 both hands,   intrensic hand muscles atrophy noted b/l Musculoskeletal: No joint swelling or tenderness 5/5 b/l UE strength 5/5 in b/l LE with exception of Hip  flexion 4+/5 bilaterally     Assessment/Plan: 1. Functional deficits which require 3+ hours per day of interdisciplinary therapy in a comprehensive inpatient rehab setting. Physiatrist is providing close team supervision and 24 hour management of active medical problems listed below. Physiatrist and rehab team continue to assess barriers to discharge/monitor patient progress toward functional and medical goals  Care Tool:  Bathing    Body parts bathed by patient: Right arm, Left arm, Chest, Abdomen, Front perineal area, Buttocks, Right upper leg, Left upper leg, Right lower leg, Left lower leg, Face   Body parts bathed by helper: Buttocks     Bathing assist Assist Level: Supervision/Verbal cueing     Upper Body Dressing/Undressing Upper body dressing   What is the patient wearing?: Pull over shirt    Upper body assist Assist Level: Independent    Lower Body Dressing/Undressing Lower body dressing      What is the patient wearing?: Underwear/pull up, Pants     Lower body assist Assist for lower body dressing: Independent with assitive device Assistive Device Comment: reacher, shoe horn, sock aid (pt has AE at home)   Toileting Toileting    Toileting assist Assist for toileting: Independent with assistive device     Transfers Chair/bed transfer  Transfers assist  Chair/bed transfer activity did not occur: Safety/medical concerns  Chair/bed transfer assist level: Supervision/Verbal cueing     Locomotion Ambulation   Ambulation assist   Ambulation activity did not occur: Safety/medical concerns (fatigue, weakness, pain)  Assist level: Contact Guard/Touching assist Assistive device: Rollator Max distance: 150'   Walk 10 feet activity   Assist  Walk 10 feet activity did not occur: Safety/medical concerns (fatigue, weakness, pain)  Assist level: Supervision/Verbal cueing Assistive device: Rollator   Walk 50 feet activity   Assist Walk 50 feet with  2 turns activity did not occur: Safety/medical concerns (fatigue, weakness, pain)  Assist level: Supervision/Verbal cueing Assistive device: Rollator    Walk 150 feet activity   Assist Walk 150 feet activity did not occur: Safety/medical concerns (fatigue, weakness, pain)  Assist level: Contact Guard/Touching assist Assistive device: Rollator    Walk 10 feet on uneven surface  activity   Assist Walk 10 feet on uneven surfaces activity did not occur: Safety/medical concerns (fatigue, weakness, pain)         Wheelchair     Assist Is the patient using a wheelchair?: Yes Type of Wheelchair: Manual    Wheelchair assist level: Supervision/Verbal cueing Max wheelchair distance: 13ft    Wheelchair 50 feet with 2 turns activity    Assist        Assist Level: Supervision/Verbal cueing   Wheelchair 150 feet activity     Assist      Assist Level: Total Assistance - Patient < 25% (pt propels 50 ft)   Blood pressure 118/73, pulse 70, temperature 97.7 F (36.5 C), resp. rate 18, weight 99.4 kg, SpO2 92 %.     Medical Problem List and Plan: 1. Functional deficits secondary to Debility due to septic shock              -patient may shower             -ELOS/Goals: 10-14 days, mod I PT/OT/SLP  -Continue CIR, PT,OT,SLP  -Completed 165 ft gait training  -Team conference yesterday- Discharge tomorrow 6/9 2.  Antithrombotics: -DVT/anticoagulation:  Pharmaceutical: Coumadin and Lovenox             -antiplatelet therapy: NA 3. Chronic low back pain:  Tylenol prn. Tramadol added prn 4. Mood: LCSW to follow for evaluation and support.              -antipsychotic agents: N/A 5. Neuropsych: This patient is capable of making decisions on his own behalf. 6. Skin/Wound Care: Routine pressure relief measures.  7. Fluids/Electrolytes/Nutrition:  Monitor I/O. Check CMET in am. 8. CAD/ICM s/p BiV: Monitor weights daily and for symptoms with increase in activity.              --continue Coreg, Jardiance, pravastatin and Entresto.              --low salt HH diet.              -Denies chest pain  -Cardiology signed off 6/1, appreciate assistance  -HR stable, continue to monitor 9. A fib w/RVR: HR controlled on amiodarone and Coreg.             --monitor for symptoms with increase in activity              -Denies palpitations  -INR 3.1 today, pharmacy following and adjusting as needed, appreciate pharmacy assistance, continue coumadin, DOACs should not be used due to medication interaction 10 . Pre-diabetes: Hgb A1C- 5.7. Monitor  BS ac/hs--should improve with d/c of prednisone today             --continue insulin glargline but wean to off as BS improve.   -6/1Glucose well controlled this AM, Will decrease to 5 units  -6/2 Decrease insulin to 3 units  -6/5 DC insulin  -6/8 glucose stable still without insulin 11. CKD III: Avoid nephrotoxic meds. Recheck CMET in am.  -Cr 1.18 5/31 -6/5 Cr 1.24 today 12. BPH: Will monitor voiding with bladder scan.  -bladder scans do not indicate retention, bladder scans discontinued 13. 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   -no recent seizures reported 14. COPD: Pulmonary hygiene. Completes prednisone 40 mg X 5 days to day. --Continue Incruse daily.  -Continue O2 [place] if needed, currently off -O2 stable on Room air 15. Constipation             -Increase miralax to BID  Continue sorbitol, Dulcolax supp prn 16. UTI, he had Urinary Incontinence, reports going of since initial admission  -U/A ordered, +wbc bacteria, Will order keflex for UTI. UC reviewed and shows 20,000 colonies of Proteus. F/u sensitivities.   -pt reports incontinence is  improved, check additional bladder scans  -6/7 completing abx  17. Anemia  -HBG stable at 10, continue to monitor on routine labs    LOS: 8 days A FACE TO FACE EVALUATION WAS PERFORMED  Jennye Boroughs 10/09/2021, 7:55 AM

## 2021-10-09 NOTE — Progress Notes (Signed)
Speech Language Pathology Discharge Summary  Patient Details  Name: Ricky Hayden MRN: [id number] Date of Birth: 08-22-1945  Today's Date: 10/09/2021 SLP Individual Time: 1417-1500 SLP Individual Time Calculation (min): 43 min   Skilled Therapeutic Interventions:  Skilled ST services focused on education and cognitive skills. SLP administered reassessment of cognitive skills with SLUMS, pt scored 28/30 (n=>27) and mild deficits on Cognistat construction task. Pt supports cognitive is at baseline, Pt's daughter via phone support pt is near but not at cognitive baseline. SLP provided education to daughter to continue completion of IADLs and all questions answered to satisfaction. Pt was left in room with call bell within reach and chair alarm set. SLP recommends to continue skilled services.    Patient has met 6 of 6 long term goals.  Patient to discharge at overall Supervision;Modified Independent level.  Reasons goals not met:     Clinical Impression/Discharge Summary:   Pt made great progress meeting 6 out 6 goals, discharging at supervision A to mod I. Pt is tolerating regular textures and thin liquids with no further dysphagia noted. Pt demonstrated improvement in cognitive skills, improving 4 points on SLUMS from evaluation. Pt demonstrated improvement in daily recall, basic-mildly complex problem solving, emergent awareness and selective attention. Education was completed with pt's daughter via phone and family will continue to complete IADLS. Pt benefit from skilled ST services in order to maximize functional independence and reduce burden of care, with continued skilled ST services.   Care Partner:  Caregiver Able to Provide Assistance: Yes  Type of Caregiver Assistance: Physical;Cognitive  Recommendation:  Home Health SLP;Outpatient SLP;24 hour supervision/assistance  Rationale for SLP Follow Up: Maximize functional communication;Maximize cognitive function and independence;Reduce  caregiver burden   Equipment: N/A   Reasons for discharge: Discharged from hospital   Patient/Family Agrees with Progress Made and Goals Achieved: Yes    Sacha Topor  [facility] 10/09/2021, 3:57 PM

## 2021-10-09 NOTE — Progress Notes (Signed)
Patient ID: Ricky Hayden, male   DOB: [birth year], 76 y.o.   MRN: [id number]  Daughter called to let this worker know pt's rollator will not fit through the doorway or bathroom and wanted to  see if he can ambulate without a assistive device to be able to access this. Team feels he needs to use a walker, will order a regular rolling walker from [place] hopefully this will fit through the hallway. Pt reports he wall walked before. Daughter wants therapists to try to have him ambulate today without a device or a rolling walker and go from there. Discussed again it would be better to have supervision at discharge, which family can not provide due to work schedules. Have made referral for aide via [facility] but they may have a waiting list for this service. Awaiting [facility] to return my message.

## 2021-10-09 NOTE — Progress Notes (Signed)
Occupational Therapy Session Note  Patient Details  Name: Ricky Hayden MRN: [id number] Date of Birth: 1945/09/27  Today's Date: 10/09/2021 OT Individual Time: 3149-7026 OT Individual Time Calculation (min): 60 min    Short Term Goals: Week 1:  OT Short Term Goal 1 (Week 1): Pt will complete LB ADL at EOB  with min A for balance using AE OT Short Term Goal 1 - Progress (Week 1): Met OT Short Term Goal 2 (Week 1): Pt will complete stand pivot transfer from EOB to [facility] with MinA  at 90%safe OT Short Term Goal 2 - Progress (Week 1): Met OT Short Term Goal 3 (Week 1): Pt will complete grooming activity at sink at w/c LOF with s/u only at 90% safe OT Short Term Goal 3 - Progress (Week 1): Met OT Short Term Goal 4 (Week 1): Pt will don pants with ModA using AE for addiitonal reach at chair LOF and 90%safe OT Short Term Goal 4 - Progress (Week 1): Met OT Short Term Goal 5 (Week 1): The pt will tolerate greater than 25 mins of activity at 90% safe with 2-3 rest breaks. OT Short Term Goal 5 - Progress (Week 1): Met  Skilled Therapeutic Interventions/Progress Updates:    Pt received in wc ready for therapy.  Pt stood from chair to rollator and then stood at sink to brush teeth, then ambulated with rollator to toilet. Pt did not need to toilet but demonstrated how he sits and stands  from toilet with mod I using bars (he has bars at home).  Discussed walking without rollator but pt needs some UB support for now. If rollator does not fit in his home bathroom he can use a RW.  He is not ready to walk without some support due to endurance. Pt removed all clothing independent, stepped into shower and bathed using sit to stands for bottom and long sponge for back and feet.  Dried off then ambulated to chair in room.  Pt dressed self with mod I using reacher and sock aide.  Discussed that in this hospital environment (with sloped floor to bathroom and obstacles of linen bags on doors) I want him to have  supervision with ambulation to bathroom until he leaves but in his home environment he can move around safely on his own. He will have supervision from his family for the first 3 days.   Pt excited to go home tomorrow. All goals met.   Therapy Documentation Precautions:  Precautions Precautions: Fall Precaution Comments: monitor O2 Restrictions Weight Bearing Restrictions: No Therapy Vitals Pulse Rate: 64 BP: 116/69 Pain: no c/o pain  Pain Assessment Pain Scale: 0-10 Pain Score: 0-No pain ADL: ADL Eating: Independent Where Assessed-Eating: Bed level Grooming: Independent Where Assessed-Grooming: Standing at sink Upper Body Bathing: Setup Where Assessed-Upper Body Bathing: Shower Lower Body Bathing: Setup Where Assessed-Lower Body Bathing: Shower Upper Body Dressing: Independent Where Assessed-Upper Body Dressing: Edge of bed Lower Body Dressing: Modified independent Where Assessed-Lower Body Dressing: Edge of bed Toileting: Modified independent Where Assessed-Toileting: Glass blower/designer: Diplomatic Services operational officer Method: Counselling psychologist: Energy manager: Not assessed Tub/Shower Transfer Method:  (based on observation) Social research officer, government: Close supervision Social research officer, government Method: Heritage manager: Grab bars, Transfer tub bench   Therapy/Group: Individual Therapy  [place] 10/09/2021, 10:47 AM

## 2021-10-09 NOTE — Discharge Instructions (Addendum)
Inpatient Rehab Discharge Instructions  Ricky Hayden Discharge date and time:  10/10/21  Activities/Precautions/ Functional Status: Activity: no lifting, driving, or strenuous exercise till cleared by MD Diet: cardiac diet and diabetic diet Wound Care: keep wound clean and dry   Functional status:  ___ No restrictions     ___ Walk up steps independently ___ 24/7 supervision/assistance   ___ Walk up steps with assistance _X__ Intermittent supervision/assistance  _X__ Bathe/dress independently ___ Walk with walker     ___ Bathe/dress with assistance ___ Walk Independently    ___ Shower independently ___ Walk with assistance    __X_ Shower with supervision _X__ No alcohol     ___ Return to work/school ________   Special Instructions: Need to follow up with PCP and cardiology in next 1-2 weeks.  [facility] will need to call the [facility] to get permission/code to pick up medications at [facility] or go by the [place] today.  Coumadin Level to be checked Tuesday. Appointment has been set with Cardiology office at [place], [address] (above [address] at [facility]    [place]:   PT  OT  SP  RN [place]   Medical Equipment/Items Ordered: [name] TO BE SENT TO PT'S HOME [facility]IA [facility]                                                 [facility]  [facility] WILL CONTACT DAUGHTER ONCE NAME COMES UP ON LIST  2 WEEK WAIT    My questions have been answered and I understand these instructions. I will adhere to these goals and the provided educational materials after my discharge from the hospital.  Patient/Caregiver Signature _______________________________ Date __________  Clinician Signature _______________________________________ Date __________  Please bring this form and your medication list with  you to all your follow-up doctor's appointments.      Information on my medicine - Coumadin   (Warfarin)  This medication education was reviewed with me or my healthcare representative as part of my discharge preparation.  Why was Coumadin prescribed for you? Coumadin was prescribed for you because you have a blood clot or a medical condition that can cause an increased risk of forming blood clots. Blood clots can cause serious health problems by blocking the flow of blood to the heart, lung, or brain. Coumadin can prevent harmful blood clots from forming. As a reminder your indication for Coumadin is:  Stroke Prevention because of Atrial Fibrillation  What test will check on my response to Coumadin? While on Coumadin (warfarin) you will need to have an INR test regularly to ensure that your dose is keeping you in the desired range. The INR (international normalized ratio) number is calculated from the result of the laboratory test called prothrombin time (PT).  If an INR APPOINTMENT HAS NOT ALREADY BEEN MADE FOR YOU please schedule an appointment to have this lab work done by your health care provider within 7 days. Your INR goal is usually a number between:  2 to 3 or your provider may give you a more narrow range like 2-2.5.  Ask your health care provider during an office visit what your goal INR is.  What  do you need to  know  About  COUMADIN? Take Coumadin (warfarin) exactly as prescribed by your healthcare provider about the same time each day.  DO NOT stop taking without talking to the doctor who prescribed the medication.  Stopping without other blood clot prevention medication to take the place of Coumadin may increase your risk of developing a new clot or stroke.  Get refills before you run out.  What do you do if you miss a dose? If you miss a dose, take it as soon as you remember on the same day then continue your regularly scheduled regimen the next day.  Do not take two doses of  Coumadin at the same time.  Important Safety Information A possible side effect of Coumadin (Warfarin) is an increased risk of bleeding. You should call your healthcare provider right away if you experience any of the following: Bleeding from an injury or your nose that does not stop. Unusual colored urine (red or dark brown) or unusual colored stools (red or black). Unusual bruising for unknown reasons. A serious fall or if you hit your head (even if there is no bleeding).  Some foods or medicines interact with Coumadin (warfarin) and might alter your response to warfarin. To help avoid this: Eat a balanced diet, maintaining a consistent amount of Vitamin K. Notify your provider about major diet changes you plan to make. Avoid alcohol or limit your intake to 1 drink for women and 2 drinks for men per day. (1 drink is 5 oz. wine, 12 oz. beer, or 1.5 oz. liquor.)  Make sure that ANY health care provider who prescribes medication for you knows that you are taking Coumadin (warfarin).  Also make sure the healthcare provider who is monitoring your Coumadin knows when you have started a new medication including herbals and non-prescription products.  Coumadin (Warfarin)  Major Drug Interactions  Increased Warfarin Effect Decreased Warfarin Effect  Alcohol (large quantities) Antibiotics (esp. Septra/Bactrim, Flagyl, Cipro) Amiodarone (Cordarone) Aspirin (ASA) Cimetidine (Tagamet) Megestrol (Megace) NSAIDs (ibuprofen, naproxen, etc.) Piroxicam (Feldene) Propafenone (Rythmol SR) Propranolol (Inderal) Isoniazid (INH) Posaconazole (Noxafil) Barbiturates (Phenobarbital) Carbamazepine (Tegretol) Chlordiazepoxide (Librium) Cholestyramine (Questran) Griseofulvin Oral Contraceptives Rifampin Sucralfate (Carafate) Vitamin K   Coumadin (Warfarin) Major Herbal Interactions  Increased Warfarin Effect Decreased Warfarin Effect  Garlic Ginseng Ginkgo biloba Coenzyme Q10 Green tea St.  John's wort    Coumadin (Warfarin) FOOD Interactions  Eat a consistent number of servings per week of foods HIGH in Vitamin K (1 serving =  cup)  Collards (cooked, or boiled & drained) Kale (cooked, or boiled & drained) Mustard greens (cooked, or boiled & drained) Parsley *serving size only =  cup Spinach (cooked, or boiled & drained) Swiss chard (cooked, or boiled & drained) Turnip greens (cooked, or boiled & drained)  Eat a consistent number of servings per week of foods MEDIUM-HIGH in Vitamin K (1 serving = 1 cup)  Asparagus (cooked, or boiled & drained) Broccoli (cooked, boiled & drained, or raw & chopped) Brussel sprouts (cooked, or boiled & drained) *serving size only =  cup Lettuce, raw (green leaf, endive, romaine) Spinach, raw Turnip greens, raw & chopped   These websites have more information on Coumadin (warfarin):  FailFactory.se; VeganReport.com.au;

## 2021-10-10 LAB — PROTIME-INR
INR: 3.2 — ABNORMAL HIGH (ref 0.8–1.2)
Prothrombin Time: 32.7 seconds — ABNORMAL HIGH (ref 11.4–15.2)

## 2021-10-10 LAB — GLUCOSE, CAPILLARY: Glucose-Capillary: 99 mg/dL (ref 70–99)

## 2021-10-10 MED ORDER — PRAVASTATIN SODIUM 40 MG PO TABS: 40.0000 mg | ORAL_TABLET | Freq: Every evening | ORAL | 0 refills | Status: DC

## 2021-10-10 MED ORDER — UMECLIDINIUM BROMIDE 62.5 MCG/ACT IN AEPB
1.0000 | INHALATION_SPRAY | Freq: Every day | RESPIRATORY_TRACT | 1 refills | Status: DC
Start: 2021-10-10 — End: 2021-10-10

## 2021-10-10 MED ORDER — AMIODARONE HCL 200 MG PO TABS: ORAL_TABLET | ORAL | 1 refills | Status: DC

## 2021-10-10 MED ORDER — MELATONIN 3 MG PO TABS: 3.0000 mg | ORAL_TABLET | Freq: Every day | ORAL | 0 refills | Status: DC

## 2021-10-10 MED ORDER — FINASTERIDE 5 MG PO TABS: 2.5000 mg | ORAL_TABLET | Freq: Every day | ORAL | 0 refills | Status: DC

## 2021-10-10 MED ORDER — WARFARIN SODIUM 3 MG PO TABS: 3.0000 mg | ORAL_TABLET | Freq: Every day | ORAL | 0 refills | Status: DC

## 2021-10-10 MED ORDER — FLUTICASONE FUROATE-VILANTEROL 200-25 MCG/ACT IN AEPB: 1.0000 | INHALATION_SPRAY | Freq: Every day | RESPIRATORY_TRACT | 1 refills | Status: DC

## 2021-10-10 MED ORDER — ACETAMINOPHEN 325 MG PO TABS: 325.0000 mg | ORAL_TABLET | ORAL | Status: DC | PRN

## 2021-10-10 MED ORDER — UMECLIDINIUM BROMIDE 62.5 MCG/ACT IN AEPB: 1.0000 | INHALATION_SPRAY | Freq: Every day | RESPIRATORY_TRACT | 1 refills | Status: DC

## 2021-10-10 MED ORDER — POLYETHYLENE GLYCOL 3350 17 G PO PACK: 17.0000 g | PACK | Freq: Two times a day (BID) | ORAL | 1 refills | Status: AC

## 2021-10-10 MED ORDER — VITAMIN D3 25 MCG PO TABS: 1000.0000 [IU] | ORAL_TABLET | Freq: Every day | ORAL | 0 refills | Status: AC

## 2021-10-10 MED ORDER — PANTOPRAZOLE SODIUM 40 MG PO TBEC: 40.0000 mg | DELAYED_RELEASE_TABLET | Freq: Every day | ORAL | 0 refills | Status: AC

## 2021-10-10 MED ORDER — PANTOPRAZOLE SODIUM 40 MG PO TBEC: 40.0000 mg | DELAYED_RELEASE_TABLET | Freq: Every day | ORAL | 0 refills | Status: DC

## 2021-10-10 MED ORDER — TRAMADOL HCL 50 MG PO TABS: 50.0000 mg | ORAL_TABLET | Freq: Two times a day (BID) | ORAL | 0 refills | Status: DC | PRN

## 2021-10-10 MED ORDER — POLYETHYLENE GLYCOL 3350 17 G PO PACK: 17.0000 g | PACK | Freq: Two times a day (BID) | ORAL | 1 refills | Status: DC

## 2021-10-10 MED ORDER — CARVEDILOL 6.25 MG PO TABS: 6.2500 mg | ORAL_TABLET | Freq: Two times a day (BID) | ORAL | 0 refills | Status: DC

## 2021-10-10 MED ORDER — ENTRESTO 24-26 MG PO TABS: 1.0000 | ORAL_TABLET | Freq: Two times a day (BID) | ORAL | 3 refills | Status: DC

## 2021-10-10 MED ORDER — WARFARIN SODIUM 3 MG PO TABS: 3.0000 mg | ORAL_TABLET | Freq: Every day | ORAL | Status: DC

## 2021-10-10 MED ORDER — EMPAGLIFLOZIN 10 MG PO TABS: 10.0000 mg | ORAL_TABLET | Freq: Every day | ORAL | 3 refills | Status: DC

## 2021-10-10 MED ORDER — TRAMADOL HCL 50 MG PO TABS
50.0000 mg | ORAL_TABLET | Freq: Two times a day (BID) | ORAL | 0 refills | Status: DC | PRN
Start: 2021-10-10 — End: 2021-10-10

## 2021-10-10 NOTE — Progress Notes (Signed)
[place] for Warfarin and Enoxaparin Indication: atrial fibrillation  Allergies  Allergen Reactions   Calcium Channel Blockers Other (See Comments)    Came to hospital in 1995-caused chest pain     Patient Measurements: Weight: 99.4 kg (219 lb 2.2 oz)  Vital Signs: Temp: 98.1 F (36.7 C) (06/09 0502) BP: 113/67 (06/09 0502) Pulse Rate: 58 (06/09 0502)  Labs: Recent Labs    10/08/21 0629 10/09/21 0542 10/10/21 0556  LABPROT 28.0* 31.8* 32.7*  INR 2.6* 3.1* 3.2*    Estimated Creatinine Clearance: 56 mL/min (by C-G formula based on SCr of 1.24 mg/dL).   Assessment: 76 year old male with new PAF. Patient recently had TEE/DCCV on 5/26. Patient started on enoxaparin and warfarin bridge on 5/30. Due to interaction with phenobarbital ([place] anticoagulation/drug interaction document), DOACs should not be used. Interaction discussed with cardiology on 5/31. Cardiology recommending to continue with warfarin therapy.   Significant Drug Interactions - amiodarone: may increase INR - phenobarbital: may decrease INR  6/05 CBC stable, no signs bleeding reported. INR therapeutic x2 days, now slightly above goal at 3.2. Sensitive to warfarin adjustments. INR now stabilizing. Will continue reduced warfarin dose and monitor closely.   Goal of Therapy:  INR 2-3 Monitor platelets by anticoagulation protocol: Yes   Plan:  Give warfarin 3 mg PO x1 dose Check INR daily while on warfarin Continue to monitor H&H and platelets  If patient discharges, patient can continue warfarin 3mg  PO daily until follow-up with outpatient anticoagulation clinic.    Thank you for allowing pharmacy to be a part of this patient's care.  Ardyth Harps, PharmD Clinical Pharmacist

## 2021-10-10 NOTE — Progress Notes (Signed)
Inpatient Rehabilitation Discharge Medication Review by a Pharmacist  A complete drug regimen review was completed for this patient to identify any potential clinically significant medication issues.  High Risk Drug Classes Is patient taking? Indication by Medication  Antipsychotic No   Anticoagulant Yes Warfarin: Afib  Antibiotic No   Opioid Yes Tramadol: PRN pain  Antiplatelet No   Hypoglycemics/insulin Yes Empagliflozin: CHF, DM  Vasoactive Medication Yes Amiodarone: Afib Coreg, Entresto: HTN, CHF  Chemotherapy No   Other Yes Albuterol, Breo, Incruse: SOB/COPD Cetirizine: allergies Diclofenac gel: topical pain relief Finasteride: BPH Phenobarbital: seizure ppx Melatonin: PRN sleep Miralax: constipation Pravastatin: HLD       Type of Medication Issue Identified Description of Issue Recommendation(s)  Drug Interaction(s) (clinically significant)     Duplicate Therapy     Allergy     No Medication Administration End Date     Incorrect Dose     Additional Drug Therapy Needed     Significant med changes from prior encounter (inform family/care partners about these prior to discharge). Medications discontinued: - Symbicort/Spiriva (replaced with Breo/Incruse), insulin glargine, tamsulosin, torsemide Communicate changes with patient/family on discharge  Other       Clinically significant medication issues were identified that warrant physician communication and completion of prescribed/recommended actions by midnight of the next day:  No   Pharmacist comments:  - Warfarin for afib/aflutter: continue warfarin 3mg  PO daily until follow-up with outpatient anticoagulation clinic New warfarin education completed with family and patient 6/09 Patient scheduled to follow-up with [facility] anticoagulation clinic but family to work on transferring services to [facility] anticoagulation clinic in future  Time spent performing this drug regimen review (minutes): 20   Thank you for allowing  pharmacy to be a part of this patient's care.  Ardyth Harps, PharmD Clinical Pharmacist

## 2021-10-10 NOTE — Discharge Summary (Signed)
Physician Discharge Summary  Patient ID: Ricky Hayden MRN: [id number] DOB/AGE: 1945/12/26 76 y.o.  Admit date: 10/01/2021 Discharge date: 10/10/2021  Discharge Diagnoses:  Principal Problem:   Debility Active Problems:   Stage 3b chronic kidney disease (CKD) ([place])   Type 2 diabetes mellitus with chronic kidney disease, without long-term current use of insulin (HCC)   Paroxysmal atrial fibrillation (H[facility])   Seizure disorder ([place])   Nephrolithiasis   Discharged Condition: stable  Significant Diagnostic Studies: N/A  Labs:  Basic Metabolic Panel:    Latest Ref Rng & Units 10/07/2021    5:35 AM 10/02/2021    5:35 AM 10/01/2021    5:09 PM  BMP  Glucose 70 - 99 mg/dL 94  86  110   BUN 8 - 23 mg/dL 10  14  13    Creatinine 0.61 - 1.24 mg/dL 1.24  1.25  1.14   Sodium 135 - 145 mmol/L 140  141  138   Potassium 3.5 - 5.1 mmol/L 4.3  4.2  5.2   Chloride 98 - 111 mmol/L 107  105  102   CO2 22 - 32 mmol/L 28  32  31   Calcium 8.9 - 10.3 mg/dL 9.0  9.4  9.4      CBC:    Latest Ref Rng & Units 10/06/2021    6:21 AM 10/02/2021    5:35 AM 10/01/2021    7:12 AM  CBC  WBC 4.0 - 10.5 K/uL 4.0  4.7  5.0   Hemoglobin 13.0 - 17.0 g/dL 10.0  9.8  10.2   Hematocrit 39.0 - 52.0 % 31.0  29.6  32.5   Platelets 150 - 400 K/uL 101  107  113      CBG: Recent Labs  Lab 10/09/21 0529 10/09/21 1206 10/09/21 1652 10/09/21 2050 10/10/21 0652  GLUCAP 97 92 103* 110* 99    Brief HPI:   Ricky Hayden is a 76 y.o. male with history of CKD, seizure disorder, CAD/cardiac arrhythmias s/p ICD/PPM, prediabetes who was originally admitted on 08/21/2021 due to sepsis from Proteus bacteremia.  Hospital course was significant for brief PEA arrest postintubation as well as aspiration pneumonia, acute renal failure due to ATN with subsequent generalized weakness and decline in functional status.  He was admitted to CIR on 09/19/2021 for intensive rehab program.  He continued to have hypoxia requiring supplemental  oxygen as well as bouts of lethargy.  He developed wide-complex tachycardia requiring transfer to acute hospital on 05/25 and underwent TEE/DCCV on 05/26 to NSR.  Stress dose steroids were added to help with respiratory status and he was extubated to 3 L oxygen.  Eliquis was changed to Coumadin with Lovenox bridge to avoid interaction with phenobarb.  Coreg was added with recommendations of slowed amiodarone taper over next few weeks.  PT OT started working with patient and and recommended CIR due to debility.  He was admitted to CIR due to decline in functional status.   Hospital Course: Ricky Hayden was admitted to rehab 10/01/2021 for inpatient therapies to consist of PT, ST and OT at least three hours five days a week. Past admission physiatrist, therapy team and rehab RN have worked together to provide customized collaborative inpatient rehab. His blood pressures were monitored on TID basis and has been stable.  Heart rate has been controlled on Coreg and tapering dose amiodarone. He was noted to have intermittent incontinence with reports ot frequency therefore UA/UCS checked with showed 20,000 colonies of Proteus mirabilis. He was treated  with 5 day course of Keflex with improvement of symptoms. PVR checks were negative for retention.  Follow up labs showed SCr back to baseline and potassium level WNL. Follow up CBC showed H/H to be stable.   Back pain has been managed with prn use of ultram. His blood sugars have been monitored with ac/hs CBG checks and blood sugars are currently controlled on Jardiance.  P.o. intake has been good and bladder continence has improved with treatment of UTI.  Bowel program has been augmented to help manage constipation.  Pharmacy has assisted with Coumadin management/dosing and INR is therapeutic at discharge.  Coumadin dose was decreased to 3 mg a day and patient to have repeat INR at Coumadin clinic next Tuesday.  Patient and family have been educated on diet restriction  as well as importance of Coumadin monitoring. He has made good gains during his stay and is modified independent in supervised setting. He will continue to receive follow up home health PT, OT, ST, RN and aide by [facility] after discharge.    Rehab course: During patient's stay in rehab weekly team conferences were held to monitor patient's progress, set goals and discuss barriers to discharge. At admission, patient required min to max assist with ADL tasks and mod assist with mobility.He exhibited mild to moderate cognitive impairments with SLUMS score 24/30. He  has had improvement in activity tolerance, balance, postural control as well as ability to compensate for deficits. He is able to complete  dressing  tasks at modified independent level and requires supervision for bathing/showering. He is modified independent for transfers and to ambulate 200' with RW. He has made gains cognitive skills with improvement in SLUMS score to 28/30. Family education has been completed.    Discharge disposition: 01-Home or Self Care  Diet: Heart Healthy/Carb Modified. Limit fluids to 2000cc/day  Special Instructions: No driving or strenuous activity till cleared by MD. Reschedule follow up with Urology at [facility].  Allergies as of 10/10/2021       Reactions   Calcium Channel Blockers Other (See Comments)   Came to hospital in 1995-caused chest pain        Medication List     STOP taking these medications    budesonide-formoterol 80-4.5 MCG/ACT inhaler Commonly known as: Symbicort   cetirizine 10 MG tablet Commonly known as: ZYRTEC   diclofenac Sodium 1 % Gel Commonly known as: VOLTAREN   docusate sodium 100 MG capsule Commonly known as: COLACE   enoxaparin 150 MG/ML injection Commonly known as: LOVENOX   feeding supplement Liqd   GINKGO BILOBA PO   insulin glargine-yfgn 100 UNIT/ML injection Commonly known as: SEMGLEE   omeprazole 20 MG capsule Commonly known as: PRILOSEC    predniSONE 20 MG tablet Commonly known as: DELTASONE   Spiriva HandiHaler 18 MCG inhalation capsule Generic drug: tiotropium   tamsulosin 0.4 MG Caps capsule Commonly known as: FLOMAX   torsemide 20 MG tablet Commonly known as: DEMADEX       TAKE these medications    acetaminophen 325 MG tablet Commonly known as: TYLENOL Take 1-2 tablets (325-650 mg total) by mouth every 4 (four) hours as needed for mild pain. What changed:  medication strength how much to take when to take this reasons to take this   albuterol 108 (90 Base) MCG/ACT inhaler Commonly known as: VENTOLIN HFA Inhale 2 puffs into the lungs every 6 (six) hours as needed for wheezing.   amiodarone 200 MG tablet Commonly known as: PACERONE  Take one pill twice a day till 06/12. Then decrease to one pill daily starting 06/13 What changed:  medication strength how much to take how to take this when to take this additional instructions Another medication with the same name was removed. Continue taking this medication, and follow the directions you see here.   carvedilol 6.25 MG tablet Commonly known as: COREG Take 1 tablet (6.25 mg total) by mouth 2 (two) times daily with a meal. What changed:  medication strength how much to take   empagliflozin 10 MG Tabs tablet Commonly known as: Jardiance Take 1 tablet (10 mg total) by mouth daily before breakfast.   Entresto 24-26 MG Generic drug: sacubitril-valsartan Take 1 tablet by mouth 2 (two) times daily. What changed: Another medication with the same name was removed. Continue taking this medication, and follow the directions you see here.   finasteride 5 MG tablet Commonly known as: PROSCAR Take 0.5 tablets (2.5 mg total) by mouth daily. What changed: how much to take   fluticasone furoate-vilanterol 200-25 MCG/ACT Aepb Commonly known as: BREO ELLIPTA Inhale 1 puff into the lungs daily.   ICAPS AREDS 2 PO Take 2 tablets by mouth daily.    melatonin 3 MG Tabs tablet Take 1 tablet (3 mg total) by mouth at bedtime.   pantoprazole 40 MG tablet Commonly known as: PROTONIX Take 1 tablet (40 mg total) by mouth daily.   PHENobarbital 64.8 MG tablet Commonly known as: LUMINAL Take 1 tablet (64.8 mg total) by mouth 2 (two) times daily.   polyethylene glycol 17 g packet Commonly known as: MIRALAX / GLYCOLAX Take 17 g by mouth 2 (two) times daily. What changed: when to take this Notes to patient: FOR CONSTIPATION   pravastatin 40 MG tablet Commonly known as: PRAVACHOL Take 1 tablet (40 mg total) by mouth every evening.   traMADol 50 MG tablet--Rx # 10 pills. Commonly known as: ULTRAM Take 1 tablet (50 mg total) by mouth every 12 (twelve) hours as needed for moderate pain. What changed: when to take this   umeclidinium bromide 62.5 MCG/ACT Aepb Commonly known as: INCRUSE ELLIPTA Inhale 1 puff into the lungs daily.   Vitamin D3 25 MCG tablet Commonly known as: Vitamin D Take 1 tablet (1,000 Units total) by mouth daily.   warfarin 3 MG tablet Commonly known as: COUMADIN Take 1 tablet (3 mg total) by mouth daily at 4 PM. What changed:  medication strength how much to take when to take this        [facility] Follow up.   Why: Call in 1-2 days for post hospital follow up Contact information: [place] [address] [phone]         Jennye Boroughs, MD Follow up.   Specialty: Physical Medicine and Rehabilitation Why: office will call you with follow up appointment Contact information: [address] [place] [address] [phone]         Jerline Pain, MD. Call.   Specialty: Cardiology Why: for follow up appointment. (have appointment at coumadin clinic for blood draw on 06/14 at 10:30 am) Contact information: [address] [place] [address] [phone]                  Signed: Bary Leriche 10/12/2021, 10:49 PM

## 2021-10-10 NOTE — Progress Notes (Signed)
INPATIENT REHABILITATION DISCHARGE NOTE   Discharge instructions by: pam love PA-C  Verbalized understanding: completed  Skin care/Wound care healing? completed  Pain: 0/10  IV's: none present   Tubes/Drains: none   O2: room air   Safety instructions: given   Patient belongings:sent with family   Discharged to: home   Discharged via: wheelchair   Notes:

## 2021-10-10 NOTE — Progress Notes (Signed)
PROGRESS NOTE   Subjective/Complaints: Going home today. He reports he is excited to be going home. No new concerns.   Review of Systems  Constitutional:  Negative for chills and fever.  Respiratory:  Negative for cough and shortness of breath.   Cardiovascular:  Negative for chest pain and palpitations.  Gastrointestinal:  Negative for abdominal pain, constipation and nausea.  Genitourinary:  Negative for dysuria and frequency.  Neurological:  Negative for focal weakness.  Psychiatric/Behavioral:  Negative for depression.      Objective:   No results found. No results for input(s): "WBC", "HGB", "HCT", "PLT" in the last 72 hours.  No results for input(s): "NA", "K", "CL", "CO2", "GLUCOSE", "BUN", "CREATININE", "CALCIUM" in the last 72 hours.   Intake/Output Summary (Last 24 hours) at 10/10/2021 0852 Last data filed at 10/10/2021 0843 Gross per 24 hour  Intake 415 ml  Output 1 ml  Net 414 ml          Physical Exam: Vital Signs Blood pressure 113/67, pulse (!) 58, temperature 98.1 F (36.7 C), resp. rate 17, weight 99.4 kg, SpO2 96 %.  General: NAD. Sitting in chair HEENT: Healed crani incision, conjugate gaze, oral mucosa pink and moist, Sitting in chair Neck: Supple without JVD or lymphadenopathy Heart: Reg rate and rhythm. + murmur Chest: CTA bilaterally without wheezes, rales, or rhonchi; no distress, on RA Abdomen: Soft, non-tender, non-distended, bowel sounds positive. Extremities: No clubbing, cyanosis, or edema. Pulses are 2+ Psych: Very pleasant, appropriate, makes eye contact Skin: warm and dry, no breakdown noted Neuro:  Follows commands, Answer questions, able to repeat 3 words, makes eye contact. Sensation intact in all 4 extremities although reports altered sensation digits 4+5 both hands,   intrensic hand muscles atrophy noted b/l Musculoskeletal: No joint swelling or tenderness 5/5 b/l UE  strength 5/5 in b/l LE with exception of Hip flexion 4+/5 bilaterally     Assessment/Plan: 1. Functional deficits which require 3+ hours per day of interdisciplinary therapy in a comprehensive inpatient rehab setting. Physiatrist is providing close team supervision and 24 hour management of active medical problems listed below. Physiatrist and rehab team continue to assess barriers to discharge/monitor patient progress toward functional and medical goals  Care Tool:  Bathing    Body parts bathed by patient: Right arm, Left arm, Chest, Abdomen, Front perineal area, Buttocks, Right upper leg, Left upper leg, Right lower leg, Left lower leg, Face   Body parts bathed by helper: Buttocks     Bathing assist Assist Level: Set up assist     Upper Body Dressing/Undressing Upper body dressing   What is the patient wearing?: Pull over shirt    Upper body assist Assist Level: Independent    Lower Body Dressing/Undressing Lower body dressing      What is the patient wearing?: Underwear/pull up, Pants     Lower body assist Assist for lower body dressing: Independent with assitive device Assistive Device Comment: reacher, shoe horn, sock aid (pt has AE at home)   Toileting Toileting    Toileting assist Assist for toileting: Independent with assistive device     Transfers Chair/bed transfer  Transfers assist  Chair/bed transfer activity  did not occur: Safety/medical concerns  Chair/bed transfer assist level: Independent with assistive device     Locomotion Ambulation   Ambulation assist   Ambulation activity did not occur: Safety/medical concerns (fatigue, weakness, pain)  Assist level: Independent with assistive device Assistive device: Rollator Max distance: 260ft   Walk 10 feet activity   Assist  Walk 10 feet activity did not occur: Safety/medical concerns (fatigue, weakness, pain)  Assist level: Independent with assistive device Assistive device: Rollator    Walk 50 feet activity   Assist Walk 50 feet with 2 turns activity did not occur: Safety/medical concerns (fatigue, weakness, pain)  Assist level: Independent with assistive device Assistive device: Rollator    Walk 150 feet activity   Assist Walk 150 feet activity did not occur: Safety/medical concerns (fatigue, weakness, pain)  Assist level: Independent with assistive device Assistive device: Rollator    Walk 10 feet on uneven surface  activity   Assist Walk 10 feet on uneven surfaces activity did not occur: Safety/medical concerns (fatigue, weakness, pain)   Assist level: Supervision/Verbal cueing Assistive device: Rollator   Wheelchair     Assist Is the patient using a wheelchair?: Yes Type of Wheelchair: Manual    Wheelchair assist level: Supervision/Verbal cueing Max wheelchair distance: 181ft    Wheelchair 50 feet with 2 turns activity    Assist        Assist Level: Supervision/Verbal cueing   Wheelchair 150 feet activity     Assist      Assist Level: Dependent - Patient 0%   Blood pressure 113/67, pulse (!) 58, temperature 98.1 F (36.7 C), resp. rate 17, weight 99.4 kg, SpO2 96 %.     Medical Problem List and Plan: 1. Functional deficits secondary to Debility due to septic shock              -patient may shower             -ELOS/Goals: 10-14 days, mod I PT/OT/SLP  -Continue CIR, PT,OT,SLP  -OK to DC home today 2.  Antithrombotics: -DVT/anticoagulation:  Pharmaceutical: Coumadin and Lovenox             -antiplatelet therapy: NA 3. Chronic low back pain:  Tylenol prn. Tramadol added prn 4. Mood: LCSW to follow for evaluation and support.              -antipsychotic agents: N/A 5. Neuropsych: This patient is capable of making decisions on his own behalf. 6. Skin/Wound Care: Routine pressure relief measures.  7. Fluids/Electrolytes/Nutrition:  Monitor I/O. Check CMET in am. 8. CAD/ICM s/p BiV: Monitor weights daily and for  symptoms with increase in activity.             --continue Coreg, Jardiance, pravastatin and Entresto.              --low salt HH diet.              -Denies chest pain  -Cardiology signed off 6/1, appreciate assistance  -wt 99.4 kg yesterday, stable, continue to follow with PCP 9. A fib w/RVR: HR controlled on amiodarone and Coreg.             --monitor for symptoms with increase in activity              -Denies palpitations  -INR  3.2 today, pharmacy following 10 . Pre-diabetes: Hgb A1C- 5.7. Monitor BS ac/hs--should improve with d/c of prednisone today             --  continue insulin glargline but wean to off as BS improve.   -6/1Glucose well controlled this AM, Will decrease to 5 units  -6/2 Decrease insulin to 3 units  -6/5 DC insulin  -6/9 CBG well controlled off insulin, f/u with PCP 11. CKD III: Avoid nephrotoxic meds. Recheck CMET in am.  -Cr 1.18 5/31 -6/5 Cr 1.24 today 12. BPH: Will monitor voiding with bladder scan.  -bladder scans do not indicate retention, bladder scans discontinued 13. 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   -no recent seizures reported 14. COPD: Pulmonary hygiene. Completes prednisone 40 mg X 5 days to day. --Continue Incruse daily.  -Continue O2 O'Brien if needed, currently off -Stable on RA, f/u with PCP 15. Constipation             -Increase miralax to BID  Continue sorbitol, Dulcolax supp prn 16. UTI, he had Urinary Incontinence, reports going of since initial admission  -U/A ordered, +wbc bacteria, Will order keflex for UTI. UC reviewed and shows 20,000 colonies of Proteus. F/u sensitivities.   -pt reports incontinence is  improved, check additional bladder scans  -6/7 completing abx   -6/9 No dysuria or urgency today. F/u as outpatient 17. Anemia  -HBG stable at 10, continue to monitor on routine labs    LOS: 9 days A FACE TO FACE EVALUATION WAS PERFORMED  Jennye Boroughs 10/10/2021, 8:52 AM

## 2021-10-14 ENCOUNTER — Ambulatory Visit (INDEPENDENT_AMBULATORY_CARE_PROVIDER_SITE_OTHER): Payer: No Typology Code available for payment source | Admitting: *Deleted

## 2021-10-14 ENCOUNTER — Telehealth: Payer: Self-pay | Admitting: *Deleted

## 2021-10-14 DIAGNOSIS — Z5181 Encounter for therapeutic drug level monitoring: Secondary | ICD-10-CM

## 2021-10-14 DIAGNOSIS — I4891 Unspecified atrial fibrillation: Secondary | ICD-10-CM | POA: Insufficient documentation

## 2021-10-14 LAB — POCT INR: INR: 4.1 — AB (ref 2.0–3.0)

## 2021-10-14 NOTE — Telephone Encounter (Signed)
Called and spoke to pt's daughter informed her Pt has Dr appointment on 6/19, informed her that home health does not need to check INR that day and to keep INR appointment for 6/19 since pt will already be here to see the Dr.

## 2021-10-14 NOTE — Patient Instructions (Addendum)
A full discussion of the nature of anticoagulants has been carried out.  A benefit risk analysis has been presented to the patient, so that they understand the justification for choosing anticoagulation at this time. The need for frequent and regular monitoring, precise dosage adjustment and compliance is stressed.  Side effects of potential bleeding are discussed.  The patient should avoid any OTC items containing aspirin or ibuprofen, and should avoid great swings in general diet.  Avoid alcohol consumption.  Call if any signs of abnormal bleeding.   Description   *NEEDS TO SCHEDULE APPT WITH CARDIOLOGIST*  Hold warfarin today and START taking warfarin 1 tablet daily except for 1/2  a tablet on Fridays. Recheck INR in 1 week.   Anticoagulation Clinic [phone] or [phone]

## 2021-10-20 ENCOUNTER — Ambulatory Visit (INDEPENDENT_AMBULATORY_CARE_PROVIDER_SITE_OTHER): Payer: No Typology Code available for payment source | Admitting: Cardiology

## 2021-10-20 ENCOUNTER — Ambulatory Visit (INDEPENDENT_AMBULATORY_CARE_PROVIDER_SITE_OTHER): Payer: No Typology Code available for payment source

## 2021-10-20 ENCOUNTER — Encounter: Payer: Self-pay | Admitting: Cardiology

## 2021-10-20 VITALS — BP 132/78 | HR 79 | Ht 68.0 in | Wt 223.0 lb

## 2021-10-20 DIAGNOSIS — I4891 Unspecified atrial fibrillation: Secondary | ICD-10-CM

## 2021-10-20 DIAGNOSIS — I480 Paroxysmal atrial fibrillation: Secondary | ICD-10-CM

## 2021-10-20 DIAGNOSIS — I447 Left bundle-branch block, unspecified: Secondary | ICD-10-CM | POA: Diagnosis not present

## 2021-10-20 DIAGNOSIS — I428 Other cardiomyopathies: Secondary | ICD-10-CM | POA: Diagnosis not present

## 2021-10-20 DIAGNOSIS — I5042 Chronic combined systolic (congestive) and diastolic (congestive) heart failure: Secondary | ICD-10-CM | POA: Diagnosis not present

## 2021-10-20 DIAGNOSIS — I469 Cardiac arrest, cause unspecified: Secondary | ICD-10-CM

## 2021-10-20 DIAGNOSIS — Z5181 Encounter for therapeutic drug level monitoring: Secondary | ICD-10-CM | POA: Diagnosis not present

## 2021-10-20 DIAGNOSIS — R600 Localized edema: Secondary | ICD-10-CM

## 2021-10-20 LAB — POCT INR: INR: 1.8 — AB (ref 2.0–3.0)

## 2021-10-20 MED ORDER — SPIRONOLACTONE 25 MG PO TABS: 12.5000 mg | ORAL_TABLET | Freq: Every day | ORAL | 3 refills | Status: DC

## 2021-10-20 MED ORDER — TORSEMIDE 20 MG PO TABS: 40.0000 mg | ORAL_TABLET | Freq: Every day | ORAL | 3 refills | Status: DC

## 2021-10-20 NOTE — Patient Instructions (Signed)
TAKE 1.5 TABLETS TODAY ONLY and then continue today only 1 tablet daily except for 1/2  a tablet on Fridays. Recheck INR in 1 week.   Anticoagulation Clinic [phone] or [phone]

## 2021-10-20 NOTE — Patient Instructions (Addendum)
Medication Instructions:  patient requested written prescription and to send prescription to [facility].   Start taking Torsemide 40  mg  ( 2  tablets)  daily   if weight is up 3 lbs over night then take an extra 20 mg  in the afternoon , if  weight is 5 lbs over then take 40 mg  in the afternoon     Spironolactone 12.5 mg   ( 1/2 tablet daily ) *If you need a refill on your cardiac medications before your next appointment, please call your pharmacy*   Lab Work: BMP in 2 weeks   If you have labs (blood work) drawn today and your tests are completely normal, you will receive your results only by: [place] (if you have MyChart) OR A paper copy in the mail If you have any lab test that is abnormal or we need to change your treatment, we will call you to review the results.   Testing/Procedures: Your physician has requested that you have a lower or upper extremity venous duplex. This test is an ultrasound of the veins in the legs or arms. It looks at venous blood flow that carries blood from the heart to the legs. Allow one hour for a Lower Venous exam. There are no restrictions or special instructions.     Follow-Up: At [facility], you and your health needs are our priority.  As part of our continuing mission to provide you with exceptional heart care, we have created designated Provider Care Teams.  These Care Teams include your primary Cardiologist (physician) and Advanced Practice Providers (APPs -  Physician Assistants and Nurse Practitioners) who all work together to provide you with the care you need, when you need it.     Your next appointment:   3 to 4 month(s)  The format for your next appointment:   In Person  Provider:   Glenetta Hew, MD    Other Instructions

## 2021-10-20 NOTE — Progress Notes (Unsigned)
Primary Care Provider: Clinic, [facility] Cardiologist: Virl Axe, MD Electrophysiologist: Virl Axe, MD *** SIGNED Clinic Note: No chief complaint on file.   ===================================  ASSESSMENT/PLAN   Problem List Items Addressed This Visit   None  1. Atrial fibrillation, unspecified type [facility]) New onset with most recent hospitalization 2/2 sepsis. Prior hx of ICD. Discharged from rehab last week. His symptoms are fatigue. He otherwise does not have palpitations. Has ICD. EKG today is paced with irregular irregular rhythm.  - Continue coumadin and follow up with coumadin clinic - EKG 65-KPTW - Basic metabolic panel  3. Bilateral lower extremity edema Increasing in pain over the last week. L>R. No skin changes. Lower suspicion for DVT but will obtain US to rule out given subtherapeutic INR. - VAS [place] LOWER EXTREMITY VENOUS (DVT); Future  ===================================  HPI:    @HCCON1 @  Ricky Hayden is here today to establish care. Dr. Ellyn Hack was scheduled because he sees the niece as a patient. [facility] through [facility] as [facility] as the [place]. Today he is concerned about his Left lower extremity edema. He has dyspnea and fatigue that is unchanged since hospitalization. He denies palpitations and otherwise has not noticed his heart is out of rhythm. He is taking his coumadin and dose was increased this week because INR is 1.8. He has not resumed his torsemide or spironolactone.   Recent Hospitalizations:  Patient presented to the [facility] emergency department on 08/29/2021 complaining of fever, chills, shortness of breath.  Patient initially required BiPAP for shortness of breath but was eventually weaned off. 5/2- Decreased LOC and PEA arrest after intubation. 5/12 extubated. 5/24 HR 130 with wide complex tachycardia. Cardiology consulted at that time. Atrial flutter suspected. Started on amiodarone. Now with new onset Afib.  Switched form lovenox to eliquis to coumadin.    Reviewed  CV studies:    The following studies were reviewed today: (if available, images/films reviewed: From Epic Chart or Care Everywhere) ECHO TEE 09/26/21: *** ECHO 08/29/21: ***    Interval History:   Ricky Hayden   CV Review of Symptoms (Summary) Cardiovascular ROS: positive for - dyspnea on exertion, edema, irregular heartbeat, and shortness of breath negative for - chest pain, loss of consciousness, murmur, or palpitations  REVIEWED OF SYSTEMS   Review of Systems  Respiratory:  Positive for cough, shortness of breath and wheezing.   Cardiovascular:  Positive for leg swelling. Negative for chest pain, palpitations, orthopnea and PND.  Neurological:  Negative for dizziness.    I have reviewed and (if needed) personally updated the patient's problem list, medications, allergies, past medical and surgical history, social and family history.   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 Sherwood Gambler   Nocturia    Obesity    Pansinusitis    a.  complicated by brain abscess and bleeding requiring craniotomy in 1995.   PONV (postoperative nausea and vomiting)    Sinusitis    s/p ethmoidectomy and nasal septoplasty    Vertigo    intermitantly    PAST SURGICAL HISTORY   Past Surgical History:  Procedure Laterality Date   BIV UPGRADE N/A 01/29/2020   Procedure: BIV UPGRADE;  Surgeon: Evans Lance, MD;  Location: [place] [facility];  Service: Cardiovascular;  Laterality: N/A;   CARDIOVERSION N/A 09/26/2021   Procedure: CARDIOVERSION;  Surgeon: [name], MD;  Location: [facility];  Service: Cardiovascular;  Laterality: N/A;   CIRCUMCISION N/A 11/20/2013   Procedure: CIRCUMCISION ADULT;  Surgeon: Claybon Jabs, MD;  Location: [facility];  Service: Urology;  Laterality: N/A;   [place]   hematomy due to sinus infection    PACEMAKER IMPLANT N/A 01/17/2020   Procedure: PACEMAKER IMPLANT;  Surgeon: Deboraha Sprang, MD;  Location: [place] [facility];  Service: Cardiovascular;  Laterality: N/A;   RIGHT/LEFT HEART CATH AND CORONARY ANGIOGRAPHY N/A 09/20/2019   Procedure: RIGHT/LEFT HEART CATH AND CORONARY ANGIOGRAPHY;  Surgeon: Jolaine Artist, MD;  Location: [facility];  Service: Cardiovascular;  Laterality: N/A;   shoulder surg rt   1995   SINUS SURGERY WITH INSTATRAK     ethmoidectomy and nasal septum repair   TEE WITHOUT CARDIOVERSION N/A 09/26/2021   Procedure: TRANSESOPHAGEAL ECHOCARDIOGRAM (TEE);  Surgeon: [name], MD;  Location: [facility];  Service: Cardiovascular;  Laterality: N/A;    Immunization History  Administered Date(s) Administered   Influenza Split 04/14/2012   Influenza Whole 02/12/2006, 03/28/2007, 03/10/2010   Influenza,inj,Quad PF,6+ Mos 02/13/2013, 03/07/2014, 04/01/2015   Pneumococcal Conjugate-13 12/10/2014   Pneumococcal Polysaccharide-23 10/07/2011   Td 01/10/2008    MEDICATIONS/ALLERGIES   Current Meds  Medication Sig   acetaminophen (TYLENOL) 325 MG tablet Take 1-2 tablets (325-650 mg total) by mouth every 4 (four) hours as needed for mild pain.   albuterol (PROVENTIL HFA;VENTOLIN HFA) 108 (90 BASE) MCG/ACT inhaler Inhale 2 puffs  into the lungs every 6 (six) hours as needed for wheezing.   amiodarone (PACERONE) 200 MG tablet Take one pill twice a day till 06/12. Then decrease to one pill daily starting 06/13   carvedilol (COREG) 6.25 MG tablet Take 1 tablet (6.25 mg total) by mouth 2 (two) times daily with a meal.   empagliflozin (JARDIANCE) 10 MG TABS tablet Take 1 tablet (10 mg total) by mouth daily before breakfast.   finasteride (PROSCAR) 5 MG tablet Take 0.5 tablets (2.5 mg total) by mouth daily.   fluticasone furoate-vilanterol (BREO ELLIPTA) 200-25 MCG/ACT AEPB Inhale 1 puff into the lungs daily.   melatonin 3 MG TABS tablet Take 1 tablet (3 mg total) by mouth at bedtime.   Multiple Vitamins-Minerals (ICAPS AREDS 2 PO) Take 2 tablets by mouth daily.   pantoprazole (PROTONIX) 40 MG tablet Take 1 tablet (40 mg total) by mouth daily.   PHENobarbital (LUMINAL) 64.8 MG tablet Take 1 tablet (64.8 mg total) by mouth 2 (two) times daily.   polyethylene glycol (MIRALAX / GLYCOLAX) 17 g packet Take 17 g by mouth 2 (two) times daily.   pravastatin (PRAVACHOL) 40 MG tablet Take 1 tablet (40 mg total) by mouth every evening.   sacubitril-valsartan (ENTRESTO)  24-26 MG Take 1 tablet by mouth 2 (two) times daily.   traMADol (ULTRAM) 50 MG tablet Take 1 tablet (50 mg total) by mouth every 12 (twelve) hours as needed for moderate pain.   umeclidinium bromide (INCRUSE ELLIPTA) 62.5 MCG/ACT AEPB Inhale 1 puff into the lungs daily.   Vitamin D3 (VITAMIN D) 25 MCG tablet Take 1 tablet (1,000 Units total) by mouth daily.   warfarin (COUMADIN) 3 MG tablet Take 1 tablet (3 mg total) by mouth daily at 4 PM.    Allergies  Allergen Reactions   Calcium Channel Blockers Other (See Comments)    Came to hospital in 1995-caused chest pain     SOCIAL HISTORY/FAMILY HISTORY   Reviewed in Epic:   Social History   Tobacco Use   Smoking status: Former    Types: Cigarettes    Quit date: 05/04/1986    Years since quitting: 35.4   Smokeless  tobacco: Never  Vaping Use   Vaping Use: Never used  Substance Use Topics   Alcohol use: No    Alcohol/week: 0.0 standard drinks of alcohol   Drug use: No   Social History   Social History Narrative   Not on file   Family History  Problem Relation Age of Onset   Heart failure Mother 4   Heart attack Father 73    OBJCTIVE -PE, EKG, labs   Wt Readings from Last 3 Encounters:  10/20/21 223 lb (101.2 kg)  10/09/21 219 lb 2.2 oz (99.4 kg)  09/30/21 295 lb 6.7 oz (134 kg)    Physical Exam: Ht 5\' 8"  (1.727 m)   Wt 223 lb (101.2 kg)   BMI 33.91 kg/m  Physical Exam Vitals reviewed.  Constitutional:      General: He is not in acute distress.    Appearance: He is not ill-appearing, toxic-appearing or diaphoretic.  Cardiovascular:     Rate and Rhythm: Normal rate. Rhythm irregular.     Heart sounds: No murmur heard.    No friction rub. No gallop.  Pulmonary:     Effort: Pulmonary effort is normal. No respiratory distress.     Breath sounds: Wheezing and rhonchi present.  Musculoskeletal:     Right lower leg: Edema present.     Left lower leg: Edema present.     Comments: BLE pitting 2+ L>R. No overlying skin changes or warmth  Neurological:     Mental Status: He is alert.  Psychiatric:        Mood and Affect: Mood normal.        Behavior: Behavior normal.      Adult ECG Report  Rate: 70 ;  Rhythm:  Ventricular paced ;   Narrative Interpretation: Dual paced, irregularly irregular rhythm  Recent Labs:  ***  Lab Results  Component Value Date   CHOL 134 08/15/2019   HDL 35 (L) 08/15/2019   LDLCALC 77 08/15/2019   TRIG 110 08/15/2019   CHOLHDL 3.8 08/15/2019   Lab Results  Component Value Date   CREATININE 1.24 10/07/2021   BUN 10 10/07/2021   NA 140 10/07/2021   K 4.3 10/07/2021   CL 107 10/07/2021   CO2 28 10/07/2021      Latest Ref Rng & Units 10/06/2021    6:21 AM 10/02/2021    5:35 AM 10/01/2021    7:12 AM  CBC  WBC 4.0 - 10.5 K/uL 4.0  4.7  5.0    Hemoglobin 13.0 - 17.0 g/dL 10.0  9.8  10.2  Hematocrit 39.0 - 52.0 % 31.0  29.6  32.5   Platelets 150 - 400 K/uL 101  107  113     Lab Results  Component Value Date   HGBA1C 5.7 (H) 09/02/2021   Lab Results  Component Value Date   TSH 1.439 09/02/2021    ==================================================  COVID-19 Education: The signs and symptoms of COVID-19 were discussed with the patient and how to seek care for testing (follow up with PCP or arrange E-visit).    I spent a total of *** minutes with the patient spent in direct patient consultation.  Additional time spent with chart review  / charting (studies, outside notes, etc): *** min Total Time: *** min  Current medicines are reviewed at length with the patient today.  (+/- concerns) ***  This visit occurred during the SARS-CoV-2 public health emergency.  Safety protocols were in place, including screening questions prior to the visit, additional usage of staff PPE, and extensive cleaning of exam room while observing appropriate contact time as indicated for disinfecting solutions.  Notice: This dictation was prepared with Dragon dictation along with smart phrase technology. Any transcriptional errors that result from this process are unintentional and may not be corrected upon review.   Studies Ordered:  Orders Placed This Encounter  Procedures   Basic metabolic panel   EKG [id number]   VAS [place] LOWER EXTREMITY VENOUS (DVT)   Meds ordered this encounter  Medications   DISCONTD: torsemide (DEMADEX) 20 MG tablet    Sig: Take 2 tablets (40 mg total) by mouth daily. MAy take an additional 20 mg  or 40 mg in the afternoon if 3 lbs or 5 lbs overnight weight gain.    Dispense:  200 tablet    Refill:  3   DISCONTD: spironolactone (ALDACTONE) 25 MG tablet    Sig: Take 0.5 tablets (12.5 mg total) by mouth daily.    Dispense:  45 tablet    Refill:  3   torsemide (DEMADEX) 20 MG tablet    Sig: Take 2 tablets (40 mg total)  by mouth daily. MAy take an additional 20 mg  or 40 mg in the afternoon if 3 lbs or 5 lbs overnight weight gain.    Dispense:  200 tablet    Refill:  3   spironolactone (ALDACTONE) 25 MG tablet    Sig: Take 0.5 tablets (12.5 mg total) by mouth daily.    Dispense:  45 tablet    Refill:  3    Patient Instructions / Medication Changes & Studies & Tests Ordered   There are no Patient Instructions on file for this visit.    Cathey Fredenburg Autry-Lott, DO 10/20/2021, 5:23 PM PGY-3, [place]    Thank you for choosing [facility] at [facility]!!

## 2021-10-25 ENCOUNTER — Other Ambulatory Visit: Payer: Self-pay

## 2021-10-25 ENCOUNTER — Emergency Department (HOSPITAL_BASED_OUTPATIENT_CLINIC_OR_DEPARTMENT_OTHER)
Admission: EM | Admit: 2021-10-25 | Discharge: 2021-10-25 | Disposition: A | Discharge status: Home / Self Care | Payer: No Typology Code available for payment source | Attending: Emergency Medicine | Admitting: Emergency Medicine

## 2021-10-25 ENCOUNTER — Encounter (HOSPITAL_BASED_OUTPATIENT_CLINIC_OR_DEPARTMENT_OTHER): Payer: Self-pay | Admitting: Emergency Medicine

## 2021-10-25 ENCOUNTER — Emergency Department (HOSPITAL_BASED_OUTPATIENT_CLINIC_OR_DEPARTMENT_OTHER): Payer: No Typology Code available for payment source

## 2021-10-25 DIAGNOSIS — R059 Cough, unspecified: Secondary | ICD-10-CM | POA: Diagnosis not present

## 2021-10-25 DIAGNOSIS — E1122 Type 2 diabetes mellitus with diabetic chronic kidney disease: Secondary | ICD-10-CM | POA: Diagnosis not present

## 2021-10-25 DIAGNOSIS — M7989 Other specified soft tissue disorders: Secondary | ICD-10-CM | POA: Diagnosis present

## 2021-10-25 DIAGNOSIS — R600 Localized edema: Secondary | ICD-10-CM | POA: Diagnosis not present

## 2021-10-25 DIAGNOSIS — N185 Chronic kidney disease, stage 5: Secondary | ICD-10-CM | POA: Insufficient documentation

## 2021-10-25 DIAGNOSIS — Z7984 Long term (current) use of oral hypoglycemic drugs: Secondary | ICD-10-CM | POA: Diagnosis not present

## 2021-10-25 DIAGNOSIS — N179 Acute kidney failure, unspecified: Secondary | ICD-10-CM | POA: Insufficient documentation

## 2021-10-25 DIAGNOSIS — Z7901 Long term (current) use of anticoagulants: Secondary | ICD-10-CM | POA: Insufficient documentation

## 2021-10-25 LAB — PROTIME-INR
INR: 2.2 — ABNORMAL HIGH (ref 0.8–1.2)
Prothrombin Time: 24.4 seconds — ABNORMAL HIGH (ref 11.4–15.2)

## 2021-10-25 LAB — CBC
HCT: 39.9 % (ref 39.0–52.0)
Hemoglobin: 13.1 g/dL (ref 13.0–17.0)
MCH: 31.2 pg (ref 26.0–34.0)
MCHC: 32.8 g/dL (ref 30.0–36.0)
MCV: 95 fL (ref 80.0–100.0)
Platelets: 185 10*3/uL (ref 150–400)
RBC: 4.2 MIL/uL — ABNORMAL LOW (ref 4.22–5.81)
RDW: 14.3 % (ref 11.5–15.5)
WBC: 4.7 10*3/uL (ref 4.0–10.5)
nRBC: 0 % (ref 0.0–0.2)

## 2021-10-25 LAB — BASIC METABOLIC PANEL
Anion gap: 5 (ref 5–15)
BUN: 23 mg/dL (ref 8–23)
CO2: 28 mmol/L (ref 22–32)
Calcium: 9 mg/dL (ref 8.9–10.3)
Chloride: 105 mmol/L (ref 98–111)
Creatinine, Ser: 1.51 mg/dL — ABNORMAL HIGH (ref 0.61–1.24)
GFR, Estimated: 47 mL/min — ABNORMAL LOW (ref >60)
Glucose, Bld: 89 mg/dL (ref 70–99)
Potassium: 4.1 mmol/L (ref 3.5–5.1)
Sodium: 138 mmol/L (ref 135–145)

## 2021-10-26 ENCOUNTER — Encounter: Payer: Self-pay | Admitting: Cardiology

## 2021-10-26 DIAGNOSIS — R600 Localized edema: Secondary | ICD-10-CM | POA: Insufficient documentation

## 2021-10-27 ENCOUNTER — Encounter (HOSPITAL_COMMUNITY): Payer: Self-pay

## 2021-10-27 ENCOUNTER — Emergency Department (HOSPITAL_COMMUNITY): Payer: No Typology Code available for payment source

## 2021-10-27 ENCOUNTER — Other Ambulatory Visit: Payer: Self-pay

## 2021-10-27 ENCOUNTER — Inpatient Hospital Stay (HOSPITAL_COMMUNITY)
Admission: EM | Admit: 2021-10-27 | Discharge: 2021-11-03 | DRG: 872 | Disposition: A | Discharge status: Home Health | Payer: No Typology Code available for payment source | Attending: Internal Medicine | Admitting: Internal Medicine

## 2021-10-27 DIAGNOSIS — N10 Acute tubulo-interstitial nephritis: Secondary | ICD-10-CM | POA: Diagnosis present

## 2021-10-27 DIAGNOSIS — K219 Gastro-esophageal reflux disease without esophagitis: Secondary | ICD-10-CM | POA: Diagnosis present

## 2021-10-27 DIAGNOSIS — I2510 Atherosclerotic heart disease of native coronary artery without angina pectoris: Secondary | ICD-10-CM | POA: Diagnosis present

## 2021-10-27 DIAGNOSIS — E785 Hyperlipidemia, unspecified: Secondary | ICD-10-CM | POA: Diagnosis present

## 2021-10-27 DIAGNOSIS — E669 Obesity, unspecified: Secondary | ICD-10-CM | POA: Diagnosis present

## 2021-10-27 DIAGNOSIS — Z7901 Long term (current) use of anticoagulants: Secondary | ICD-10-CM

## 2021-10-27 DIAGNOSIS — M109 Gout, unspecified: Secondary | ICD-10-CM | POA: Diagnosis present

## 2021-10-27 DIAGNOSIS — Z6832 Body mass index (BMI) 32.0-32.9, adult: Secondary | ICD-10-CM | POA: Diagnosis not present

## 2021-10-27 DIAGNOSIS — Z9981 Dependence on supplemental oxygen: Secondary | ICD-10-CM

## 2021-10-27 DIAGNOSIS — I480 Paroxysmal atrial fibrillation: Secondary | ICD-10-CM | POA: Diagnosis present

## 2021-10-27 DIAGNOSIS — A419 Sepsis, unspecified organism: Principal | ICD-10-CM | POA: Diagnosis present

## 2021-10-27 DIAGNOSIS — I5042 Chronic combined systolic (congestive) and diastolic (congestive) heart failure: Secondary | ICD-10-CM | POA: Diagnosis present

## 2021-10-27 DIAGNOSIS — Z9889 Other specified postprocedural states: Secondary | ICD-10-CM

## 2021-10-27 DIAGNOSIS — I714 Abdominal aortic aneurysm, without rupture, unspecified: Secondary | ICD-10-CM | POA: Diagnosis not present

## 2021-10-27 DIAGNOSIS — G8929 Other chronic pain: Secondary | ICD-10-CM | POA: Diagnosis present

## 2021-10-27 DIAGNOSIS — L89152 Pressure ulcer of sacral region, stage 2: Secondary | ICD-10-CM | POA: Diagnosis present

## 2021-10-27 DIAGNOSIS — N400 Enlarged prostate without lower urinary tract symptoms: Secondary | ICD-10-CM | POA: Diagnosis present

## 2021-10-27 DIAGNOSIS — N1832 Chronic kidney disease, stage 3b: Secondary | ICD-10-CM | POA: Diagnosis present

## 2021-10-27 DIAGNOSIS — Z91199 Patient's noncompliance with other medical treatment and regimen due to unspecified reason: Secondary | ICD-10-CM

## 2021-10-27 DIAGNOSIS — N281 Cyst of kidney, acquired: Secondary | ICD-10-CM | POA: Diagnosis present

## 2021-10-27 DIAGNOSIS — N12 Tubulo-interstitial nephritis, not specified as acute or chronic: Secondary | ICD-10-CM | POA: Diagnosis present

## 2021-10-27 DIAGNOSIS — J449 Chronic obstructive pulmonary disease, unspecified: Secondary | ICD-10-CM | POA: Diagnosis present

## 2021-10-27 DIAGNOSIS — E1122 Type 2 diabetes mellitus with diabetic chronic kidney disease: Secondary | ICD-10-CM | POA: Diagnosis present

## 2021-10-27 DIAGNOSIS — N486 Induration penis plastica: Secondary | ICD-10-CM | POA: Diagnosis present

## 2021-10-27 DIAGNOSIS — I719 Aortic aneurysm of unspecified site, without rupture: Secondary | ICD-10-CM | POA: Diagnosis present

## 2021-10-27 DIAGNOSIS — I7143 Infrarenal abdominal aortic aneurysm, without rupture: Secondary | ICD-10-CM | POA: Diagnosis present

## 2021-10-27 DIAGNOSIS — M1990 Unspecified osteoarthritis, unspecified site: Secondary | ICD-10-CM | POA: Diagnosis present

## 2021-10-27 DIAGNOSIS — I5043 Acute on chronic combined systolic (congestive) and diastolic (congestive) heart failure: Secondary | ICD-10-CM | POA: Diagnosis present

## 2021-10-27 DIAGNOSIS — N200 Calculus of kidney: Secondary | ICD-10-CM | POA: Diagnosis present

## 2021-10-27 DIAGNOSIS — Z8673 Personal history of transient ischemic attack (TIA), and cerebral infarction without residual deficits: Secondary | ICD-10-CM

## 2021-10-27 DIAGNOSIS — Z95810 Presence of automatic (implantable) cardiac defibrillator: Secondary | ICD-10-CM | POA: Diagnosis present

## 2021-10-27 DIAGNOSIS — I130 Hypertensive heart and chronic kidney disease with heart failure and stage 1 through stage 4 chronic kidney disease, or unspecified chronic kidney disease: Secondary | ICD-10-CM | POA: Diagnosis present

## 2021-10-27 DIAGNOSIS — R109 Unspecified abdominal pain: Secondary | ICD-10-CM

## 2021-10-27 DIAGNOSIS — N390 Urinary tract infection, site not specified: Secondary | ICD-10-CM | POA: Diagnosis present

## 2021-10-27 DIAGNOSIS — Z888 Allergy status to other drugs, medicaments and biological substances status: Secondary | ICD-10-CM

## 2021-10-27 DIAGNOSIS — B964 Proteus (mirabilis) (morganii) as the cause of diseases classified elsewhere: Secondary | ICD-10-CM | POA: Diagnosis present

## 2021-10-27 DIAGNOSIS — G4733 Obstructive sleep apnea (adult) (pediatric): Secondary | ICD-10-CM | POA: Diagnosis present

## 2021-10-27 DIAGNOSIS — N179 Acute kidney failure, unspecified: Secondary | ICD-10-CM | POA: Diagnosis present

## 2021-10-27 DIAGNOSIS — Z7984 Long term (current) use of oral hypoglycemic drugs: Secondary | ICD-10-CM

## 2021-10-27 DIAGNOSIS — R6520 Severe sepsis without septic shock: Secondary | ICD-10-CM | POA: Diagnosis present

## 2021-10-27 DIAGNOSIS — Z8249 Family history of ischemic heart disease and other diseases of the circulatory system: Secondary | ICD-10-CM

## 2021-10-27 DIAGNOSIS — R600 Localized edema: Secondary | ICD-10-CM | POA: Diagnosis present

## 2021-10-27 DIAGNOSIS — I10 Essential (primary) hypertension: Secondary | ICD-10-CM | POA: Diagnosis not present

## 2021-10-27 DIAGNOSIS — Z87891 Personal history of nicotine dependence: Secondary | ICD-10-CM

## 2021-10-27 DIAGNOSIS — R5381 Other malaise: Secondary | ICD-10-CM | POA: Diagnosis not present

## 2021-10-27 DIAGNOSIS — Z79899 Other long term (current) drug therapy: Secondary | ICD-10-CM

## 2021-10-27 LAB — OSMOLALITY: Osmolality: 304 mOsm/kg — ABNORMAL HIGH (ref 275–295)

## 2021-10-27 LAB — COMPREHENSIVE METABOLIC PANEL
ALT: 13 U/L (ref 0–44)
AST: 17 U/L (ref 15–41)
Albumin: 3.6 g/dL (ref 3.5–5.0)
Alkaline Phosphatase: 61 U/L (ref 38–126)
Anion gap: 13 (ref 5–15)
BUN: 25 mg/dL — ABNORMAL HIGH (ref 8–23)
CO2: 23 mmol/L (ref 22–32)
Calcium: 9.2 mg/dL (ref 8.9–10.3)
Chloride: 106 mmol/L (ref 98–111)
Creatinine, Ser: 1.79 mg/dL — ABNORMAL HIGH (ref 0.61–1.24)
GFR, Estimated: 39 mL/min — ABNORMAL LOW (ref >60)
Glucose, Bld: 141 mg/dL — ABNORMAL HIGH (ref 70–99)
Potassium: 3.8 mmol/L (ref 3.5–5.1)
Sodium: 142 mmol/L (ref 135–145)
Total Bilirubin: 0.7 mg/dL (ref 0.3–1.2)
Total Protein: 7.1 g/dL (ref 6.5–8.1)

## 2021-10-27 LAB — URINALYSIS, ROUTINE W REFLEX MICROSCOPIC
Bacteria, UA: NONE SEEN
Bilirubin Urine: NEGATIVE
Glucose, UA: >=500 mg/dL — AB
Ketones, ur: NEGATIVE mg/dL
Nitrite: NEGATIVE
Protein, ur: 100 mg/dL — AB
RBC / HPF: >50 RBC/hpf — ABNORMAL HIGH (ref 0–5)
Specific Gravity, Urine: 1.008 (ref 1.005–1.030)
WBC, UA: >50 WBC/hpf — ABNORMAL HIGH (ref 0–5)
pH: 6 (ref 5.0–8.0)

## 2021-10-27 LAB — CBC WITH DIFFERENTIAL/PLATELET
Abs Immature Granulocytes: 0.02 10*3/uL (ref 0.00–0.07)
Basophils Absolute: 0 10*3/uL (ref 0.0–0.1)
Basophils Relative: 0 %
Eosinophils Absolute: 0 10*3/uL (ref 0.0–0.5)
Eosinophils Relative: 0 %
HCT: 42.8 % (ref 39.0–52.0)
Hemoglobin: 14.2 g/dL (ref 13.0–17.0)
Immature Granulocytes: 0 %
Lymphocytes Relative: 3 %
Lymphs Abs: 0.2 10*3/uL — ABNORMAL LOW (ref 0.7–4.0)
MCH: 31.6 pg (ref 26.0–34.0)
MCHC: 33.2 g/dL (ref 30.0–36.0)
MCV: 95.3 fL (ref 80.0–100.0)
Monocytes Absolute: 0.1 10*3/uL (ref 0.1–1.0)
Monocytes Relative: 1 %
Neutro Abs: 5.7 10*3/uL (ref 1.7–7.7)
Neutrophils Relative %: 96 %
Platelets: 160 10*3/uL (ref 150–400)
RBC: 4.49 MIL/uL (ref 4.22–5.81)
RDW: 13.9 % (ref 11.5–15.5)
WBC: 5.9 10*3/uL (ref 4.0–10.5)
nRBC: 0 % (ref 0.0–0.2)

## 2021-10-27 LAB — LACTIC ACID, PLASMA
Lactic Acid, Venous: 2.8 mmol/L (ref 0.5–1.9)
Lactic Acid, Venous: 2.5 mmol/L (ref 0.5–1.9)
Lactic Acid, Venous: 1.5 mmol/L (ref 0.5–1.9)

## 2021-10-27 LAB — CK: Total CK: 39 U/L — ABNORMAL LOW (ref 49–397)

## 2021-10-27 LAB — APTT: aPTT: 23 seconds — ABNORMAL LOW (ref 24–36)

## 2021-10-27 LAB — CBG MONITORING, ED: Glucose-Capillary: 144 mg/dL — ABNORMAL HIGH (ref 70–99)

## 2021-10-27 LAB — PROTIME-INR
INR: 2 — ABNORMAL HIGH (ref 0.8–1.2)
Prothrombin Time: 22.2 seconds — ABNORMAL HIGH (ref 11.4–15.2)

## 2021-10-27 LAB — LIPASE, BLOOD: Lipase: 34 U/L (ref 11–51)

## 2021-10-27 LAB — TSH: TSH: 3.345 u[IU]/mL (ref 0.350–4.500)

## 2021-10-27 LAB — PHOSPHORUS: Phosphorus: 1.6 mg/dL — ABNORMAL LOW (ref 2.5–4.6)

## 2021-10-27 LAB — PROCALCITONIN: Procalcitonin: 10.85 ng/mL

## 2021-10-27 LAB — MAGNESIUM: Magnesium: 2 mg/dL (ref 1.7–2.4)

## 2021-10-27 MED ORDER — WARFARIN - PHARMACIST DOSING INPATIENT: Freq: Every day | Status: DC

## 2021-10-27 MED ORDER — TRAMADOL HCL 50 MG PO TABS
50.0000 mg | ORAL_TABLET | Freq: Two times a day (BID) | ORAL | Status: DC | PRN
  Administered 2021-10-28 – 2021-10-29 (×2): 50 mg via ORAL
  Filled 2021-10-27 (×2): qty 1

## 2021-10-27 MED ORDER — ACETAMINOPHEN 500 MG PO TABS
1000.0000 mg | ORAL_TABLET | Freq: Once | ORAL | Status: AC
  Administered 2021-10-27: 1000 mg via ORAL
  Filled 2021-10-27: qty 2

## 2021-10-27 MED ORDER — INSULIN ASPART 100 UNIT/ML IJ SOLN
0.0000 [IU] | INTRAMUSCULAR | Status: DC
  Administered 2021-10-28: 2 [IU] via SUBCUTANEOUS
  Administered 2021-10-30 (×3): 1 [IU] via SUBCUTANEOUS
  Administered 2021-11-01: 2 [IU] via SUBCUTANEOUS
  Administered 2021-11-02: 1 [IU] via SUBCUTANEOUS

## 2021-10-27 MED ORDER — HYDROCODONE-ACETAMINOPHEN 5-325 MG PO TABS
1.0000 | ORAL_TABLET | ORAL | Status: DC | PRN
  Administered 2021-10-31 (×2): 1 via ORAL
  Filled 2021-10-27 (×2): qty 1

## 2021-10-27 MED ORDER — ACETAMINOPHEN 325 MG PO TABS
650.0000 mg | ORAL_TABLET | Freq: Four times a day (QID) | ORAL | Status: DC | PRN
  Administered 2021-11-01: 650 mg via ORAL
  Filled 2021-10-27: qty 2

## 2021-10-27 MED ORDER — VANCOMYCIN HCL 2000 MG/400ML IV SOLN
2000.0000 mg | Freq: Once | INTRAVENOUS | Status: AC
  Administered 2021-10-27: 2000 mg via INTRAVENOUS
  Filled 2021-10-27: qty 400

## 2021-10-27 MED ORDER — AMIODARONE HCL 200 MG PO TABS
200.0000 mg | ORAL_TABLET | Freq: Every day | ORAL | Status: DC
  Administered 2021-10-28 – 2021-11-02 (×7): 200 mg via ORAL
  Filled 2021-10-27 (×7): qty 1

## 2021-10-27 MED ORDER — FLUTICASONE FUROATE-VILANTEROL 200-25 MCG/ACT IN AEPB
1.0000 | INHALATION_SPRAY | Freq: Every day | RESPIRATORY_TRACT | Status: DC
  Administered 2021-10-28 – 2021-11-03 (×6): 1 via RESPIRATORY_TRACT
  Filled 2021-10-27 (×2): qty 28

## 2021-10-27 MED ORDER — ALBUTEROL SULFATE (2.5 MG/3ML) 0.083% IN NEBU: 2.5000 mg | INHALATION_SOLUTION | RESPIRATORY_TRACT | Status: DC | PRN

## 2021-10-27 MED ORDER — ACETAMINOPHEN 650 MG RE SUPP: 650.0000 mg | Freq: Four times a day (QID) | RECTAL | Status: DC | PRN

## 2021-10-27 MED ORDER — ALBUTEROL SULFATE (2.5 MG/3ML) 0.083% IN NEBU: 3.0000 mL | INHALATION_SOLUTION | Freq: Four times a day (QID) | RESPIRATORY_TRACT | Status: DC | PRN

## 2021-10-27 MED ORDER — METRONIDAZOLE 500 MG/100ML IV SOLN
500.0000 mg | Freq: Once | INTRAVENOUS | Status: AC
  Administered 2021-10-27: 500 mg via INTRAVENOUS
  Filled 2021-10-27: qty 100

## 2021-10-27 MED ORDER — PHENOBARBITAL 32.4 MG PO TABS
64.8000 mg | ORAL_TABLET | Freq: Two times a day (BID) | ORAL | Status: DC
  Administered 2021-10-28 – 2021-11-03 (×14): 64.8 mg via ORAL
  Filled 2021-10-27 (×12): qty 2
  Filled 2021-10-27: qty 1
  Filled 2021-10-27 (×2): qty 2

## 2021-10-27 MED ORDER — PRAVASTATIN SODIUM 40 MG PO TABS
40.0000 mg | ORAL_TABLET | Freq: Every evening | ORAL | Status: DC
Start: 2021-10-28 — End: 2021-11-03
  Administered 2021-10-28 – 2021-11-02 (×6): 40 mg via ORAL
  Filled 2021-10-27 (×6): qty 1

## 2021-10-27 MED ORDER — VANCOMYCIN HCL IN DEXTROSE 1-5 GM/200ML-% IV SOLN: 1000.0000 mg | Freq: Once | INTRAVENOUS | Status: DC

## 2021-10-27 MED ORDER — VANCOMYCIN HCL 750 MG/150ML IV SOLN: 750.0000 mg | INTRAVENOUS | Status: DC

## 2021-10-27 MED ORDER — UMECLIDINIUM BROMIDE 62.5 MCG/ACT IN AEPB
1.0000 | INHALATION_SPRAY | Freq: Every day | RESPIRATORY_TRACT | Status: DC
  Administered 2021-10-28 – 2021-11-03 (×6): 1 via RESPIRATORY_TRACT
  Filled 2021-10-27: qty 7

## 2021-10-27 MED ORDER — SODIUM CHLORIDE 0.9 % IV SOLN
2.0000 g | Freq: Two times a day (BID) | INTRAVENOUS | Status: DC
  Administered 2021-10-27 – 2021-10-28 (×3): 2 g via INTRAVENOUS
  Filled 2021-10-27 (×2): qty 12.5

## 2021-10-27 MED ORDER — LACTATED RINGERS IV BOLUS: 1000.0000 mL | Freq: Once | INTRAVENOUS | Status: DC

## 2021-10-27 MED ORDER — LACTATED RINGERS IV SOLN: INTRAVENOUS | Status: DC

## 2021-10-27 MED ORDER — PANTOPRAZOLE SODIUM 40 MG PO TBEC
40.0000 mg | DELAYED_RELEASE_TABLET | Freq: Every day | ORAL | Status: DC
  Administered 2021-10-28 – 2021-11-02 (×6): 40 mg via ORAL
  Filled 2021-10-27 (×7): qty 1

## 2021-10-27 MED ORDER — LACTATED RINGERS IV BOLUS (SEPSIS)
1000.0000 mL | Freq: Once | INTRAVENOUS | Status: AC
  Administered 2021-10-27: 1000 mL via INTRAVENOUS

## 2021-10-27 MED ORDER — WARFARIN SODIUM 3 MG PO TABS
3.0000 mg | ORAL_TABLET | Freq: Every day | ORAL | Status: DC
  Administered 2021-10-28 – 2021-11-02 (×7): 3 mg via ORAL
  Filled 2021-10-27 (×8): qty 1

## 2021-10-27 MED ORDER — LACTATED RINGERS IV BOLUS (SEPSIS): 1000.0000 mL | Freq: Once | INTRAVENOUS | Status: DC

## 2021-10-27 MED ORDER — SODIUM CHLORIDE 0.9 % IV SOLN: INTRAVENOUS | Status: DC

## 2021-10-27 MED ORDER — SODIUM CHLORIDE 0.9 % IV SOLN
2.0000 g | Freq: Once | INTRAVENOUS | Status: DC
  Filled 2021-10-27: qty 12.5

## 2021-10-27 NOTE — Assessment & Plan Note (Signed)
Patient is on chronic phenobarbital for history of head bleed and craniotomy to prevent seizures he never had any seizure disorder but continues to be on phenobarbital for now we will continue can discuss as an outpatient with neurology if it is something that she should be on chronically

## 2021-10-27 NOTE — Assessment & Plan Note (Signed)
Once stable will need further evaluation with MRI to rule out malignancy

## 2021-10-27 NOTE — Assessment & Plan Note (Signed)
Continue Pravachol 40 mg daily 

## 2021-10-27 NOTE — Progress Notes (Signed)
Notified bedside nurse of need to draw repeat lactic acid. 

## 2021-10-27 NOTE — Subjective & Objective (Signed)
Recently admitted for severe sepsis secondary to UTI resulting in PEA arrest and intubation and prolonged rehab stay was home for 22 weeks and developed left flank pain dizziness nausea vomiting febrile Previous urine grew Proteus Mirabilis pansensitive to everything except nitrofurantoin Patient previous medical stay was complicated by respiratory failure requiring BiPAP intubation secondary to hypoxemia short PEA arrest after intubation needing pressors he was able to be discharged to CIR and then on 25 May developed referring change found to be in A-fib with hypertension admitted back to ICU cardiology was consulted at the time and then cardioversion Started on amiodarone Switch to Coumadin Patient is on chronic phenobarbital had remote history of head bleed in 1990s although never had any seizures but on chronic phenobarbital for seizure prevention. Patient has been compliant his medications He has known history of CHF and has been taking his [employer] and recently his losartan was changed to spironolactone He has been taking torsemide  In May patient required a short course of steroids For COPD.  He is on 2 L of oxygen as needed  Patient has a known left renal stone for which she is supposed to follow-up with urology to obtain cystoscopy

## 2021-10-27 NOTE — Progress Notes (Signed)
ANTICOAGULATION CONSULT NOTE - Initial Consult  Pharmacy Consult for Warfarin Indication: atrial fibrillation  Allergies  Allergen Reactions   Calcium Channel Blockers Other (See Comments)    Came to hospital in 1995-caused chest pain     Patient Measurements:   Vital Signs: Temp: 99.4 F (37.4 C) (06/26 1658) Temp Source: Oral (06/26 1658) BP: 109/48 (06/26 1900) Pulse Rate: 148 (06/26 1900)  Labs: Recent Labs    10/25/21 1704 10/25/21 1843 10/27/21 1303  HGB 13.1  --  14.2  HCT 39.9  --  42.8  PLT 185  --  160  LABPROT  --  24.4* 22.2*  INR  --  2.2* 2.0*  CREATININE 1.51*  --  1.79*    Estimated Creatinine Clearance: 39.3 mL/min (A) (by C-G formula based on SCr of 1.79 mg/dL (H)).   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 Newell Coral   Nocturia    Obesity    Pansinusitis    a.  complicated by brain abscess and bleeding requiring craniotomy in 1995.   PONV (postoperative nausea and vomiting)    Sinusitis    s/p ethmoidectomy and nasal septoplasty   Vertigo    intermitantly    Medications:  (Not in a hospital admission)  Scheduled:  Infusions:   ceFEPime (MAXIPIME) IV Stopped (10/27/21 1544)   lactated ringers 150 mL/hr at 10/27/21 1524    lactated ringers     [START ON 10/28/2021] vancomycin      Assessment: 75 yom with a history of CKD, DM2, AF on warfarin. Patient presenting with abdominal pain and flank pain. Warfarin per pharmacy placed for AF.  PTA warfarin dose is 3mg  daily per medication history and last taken 6/25.  PT/INR 22.2/2 Hgb 14.2; plt 160  Goal of Therapy:  INR 2-3 Monitor platelets by anticoagulation protocol: Yes   Plan:  Continue warfarin 3mg  daily Monitor daily INR and adjust warfarin as needed Monitor for s/s of bleeding, h/h  Delmar Landau, PharmD, BCPS 10/27/2021 7:45 PM ED Clinical Pharmacist -  [phone]

## 2021-10-27 NOTE — Progress Notes (Signed)
Pharmacy Antibiotic Note  Ricky Hayden is a 76 y.o. male for which pharmacy has been consulted for cefepime and vancomycin dosing for sepsis.  Patient with a history of AF on coumadin, CKD, DM. Patient presenting with abdominal pain and flank pain.  SCr 1.79 - above baseline WBC 5.9; LA 2.8; T 100 F; HR 69; RR 29  Plan: Metronidazole per MD Cefepime 2g q12hr Vancomycin 2000 mg once then 750 mg q24hr (eAUC 474.1) unless change in renal function -- SCr is elevated so may need adjustment in AM Trend WBC, Fever, Renal function, & Clinical course F/u cultures, clinical course, WBC, fever De-escalate when able     Temp (24hrs), Avg:100 F (37.8 C), Min:100 F (37.8 C), Max:100 F (37.8 C)  Recent Labs  Lab 10/25/21 1704 10/27/21 1303 10/27/21 1400  WBC 4.7 5.9  --   CREATININE 1.51* 1.79*  --   LATICACIDVEN  --   --  2.8*    Estimated Creatinine Clearance: 39.3 mL/min (A) (by C-G formula based on SCr of 1.79 mg/dL (H)).    Allergies  Allergen Reactions   Calcium Channel Blockers Other (See Comments)    Came to hospital in 1995-caused chest pain     Antimicrobials this admission: cefepime 6/26 >>  vancomycin 6/26 >>  metronidazole 6/26 >>   Microbiology results: Pending  Thank you for allowing pharmacy to be a part of this patient's care.  Delmar Landau, PharmD, BCPS 10/27/2021 3:19 PM ED Clinical Pharmacist -  [phone]

## 2021-10-28 ENCOUNTER — Other Ambulatory Visit: Payer: Self-pay

## 2021-10-28 DIAGNOSIS — N12 Tubulo-interstitial nephritis, not specified as acute or chronic: Secondary | ICD-10-CM | POA: Diagnosis not present

## 2021-10-28 LAB — COMPREHENSIVE METABOLIC PANEL
ALT: 11 U/L (ref 0–44)
AST: 14 U/L — ABNORMAL LOW (ref 15–41)
Albumin: 3 g/dL — ABNORMAL LOW (ref 3.5–5.0)
Alkaline Phosphatase: 49 U/L (ref 38–126)
Anion gap: 8 (ref 5–15)
BUN: 29 mg/dL — ABNORMAL HIGH (ref 8–23)
CO2: 27 mmol/L (ref 22–32)
Calcium: 8.8 mg/dL — ABNORMAL LOW (ref 8.9–10.3)
Chloride: 105 mmol/L (ref 98–111)
Creatinine, Ser: 1.83 mg/dL — ABNORMAL HIGH (ref 0.61–1.24)
GFR, Estimated: 38 mL/min — ABNORMAL LOW (ref >60)
Glucose, Bld: 86 mg/dL (ref 70–99)
Potassium: 4.5 mmol/L (ref 3.5–5.1)
Sodium: 140 mmol/L (ref 135–145)
Total Bilirubin: 0.7 mg/dL (ref 0.3–1.2)
Total Protein: 5.9 g/dL — ABNORMAL LOW (ref 6.5–8.1)

## 2021-10-28 LAB — GLUCOSE, CAPILLARY
Glucose-Capillary: 81 mg/dL (ref 70–99)
Glucose-Capillary: 84 mg/dL (ref 70–99)
Glucose-Capillary: 84 mg/dL (ref 70–99)
Glucose-Capillary: 151 mg/dL — ABNORMAL HIGH (ref 70–99)

## 2021-10-28 LAB — CBC
HCT: 35.7 % — ABNORMAL LOW (ref 39.0–52.0)
Hemoglobin: 11.7 g/dL — ABNORMAL LOW (ref 13.0–17.0)
MCH: 32.1 pg (ref 26.0–34.0)
MCHC: 32.8 g/dL (ref 30.0–36.0)
MCV: 98.1 fL (ref 80.0–100.0)
Platelets: 123 10*3/uL — ABNORMAL LOW (ref 150–400)
RBC: 3.64 MIL/uL — ABNORMAL LOW (ref 4.22–5.81)
RDW: 14.3 % (ref 11.5–15.5)
WBC: 8.7 10*3/uL (ref 4.0–10.5)
nRBC: 0 % (ref 0.0–0.2)

## 2021-10-28 LAB — PROTIME-INR
INR: 2.4 — ABNORMAL HIGH (ref 0.8–1.2)
Prothrombin Time: 25.6 seconds — ABNORMAL HIGH (ref 11.4–15.2)

## 2021-10-28 LAB — URINE CULTURE

## 2021-10-28 LAB — CREATININE, URINE, RANDOM: Creatinine, Urine: 57.31 mg/dL

## 2021-10-28 LAB — PHOSPHORUS: Phosphorus: 4.3 mg/dL (ref 2.5–4.6)

## 2021-10-28 LAB — SODIUM, URINE, RANDOM: Sodium, Ur: 66 mmol/L

## 2021-10-28 LAB — OSMOLALITY, URINE: Osmolality, Ur: 313 mOsm/kg (ref 300–900)

## 2021-10-28 LAB — PREALBUMIN: Prealbumin: 18.8 mg/dL (ref 18–38)

## 2021-10-28 MED ORDER — SODIUM CHLORIDE 0.9 % IV SOLN: INTRAVENOUS | Status: DC

## 2021-10-28 MED ORDER — DEXTROSE 5 % IV SOLN
15.0000 mmol | Freq: Once | INTRAVENOUS | Status: AC
  Administered 2021-10-28: 15 mmol via INTRAVENOUS
  Filled 2021-10-28: qty 5

## 2021-10-28 MED ORDER — CEFAZOLIN SODIUM-DEXTROSE 1-4 GM/50ML-% IV SOLN
1.0000 g | Freq: Three times a day (TID) | INTRAVENOUS | Status: DC
  Administered 2021-10-28 – 2021-11-02 (×15): 1 g via INTRAVENOUS
  Filled 2021-10-28 (×15): qty 50

## 2021-10-28 NOTE — Progress Notes (Addendum)
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  A flutter/fib and was transferred back to hospital 5/25-started on amiodarone had TEE cardioversion--ultimately returned to CIR 5/31 and was discharged 6/9 home  Followed with cardiology 6/19 in the outpatient setting-resume Coreg Ferne Coe consideration was made to stop the Jardiance 6/24 seen in emergency room cough left arm swelling-no DVT told continue torsemide  Return to hospital 6/26 left flank pain dizziness febrile ill-appearing   Hospital-Problem based course  Sepsis on admission 2/2 acute pyelonephritis probably secondary to recurrent Proteus [place] pole L kidney 1 cm stone Likely can discontinue vancomycin--change cefepime to cefazolin Keep on saline 75 cc/H May resume regular diet Was scheduled for outpatient work-up with urologist Have reached out to on-call urology Dr. Tresa Moore who confirms patient did not have a follow-up appointment but  does state that the area in the kidneys does need addressing He will schedule an appt as OP for follow up-- needs a cystoscopy-does not recommend any MRI Chr urologist onic systolic heart failure EF 40-45% with prior  Underlying A-fib on Coumadin CHA2DS2-VASc >3--recent DCCV cardioversion Continue Coumadin INR therapeutic at  2.4 Continue amiodarone 200 daily, Pravachol 40 Because of hypotension on admission Coreg 6.25 twice daily, Entresto twice daily, Aldactone 12.5 as well as Demadex 40 daily all held--resume when sepsis physiology resolves Would hold Jardiance going forward as risk for urinary infections Hypodense lesion inferior pole 12 mm 10/2021 Needs outpatient MRI to rule out solid lesion in the outpatient setting and can follow-up with urology for the same Prior intracranial hemorrhage on chronic phenobarb Continue phenobarbital 64.8 twice daily BPH Continue finasteride 2.5 daily    DVT prophylaxis: coumadin Code Status: full Family Communication: none present at bedside Disposition:  Status is: Inpatient Remains inpatient appropriate because:      Consultants:  Telephone consulted Dr. Tresa Moore  Procedures:   Antimicrobials: Ancef   Subjective: Awake alert coherent no distress wearing oxygen Tells me issues started when he started having abdominal pain while going to the bathroom felt constipated and could not pass stool then felt uncomfortable No blood and urine no nausea no vomiting now but was nauseous yesterday and threw up Always wears oxygen he tells me  Objective: Vitals:   10/27/21 2139 10/28/21 0002 10/28/21 0500 10/28/21 0700  BP: (!) 104/59 (!) 98/43 102/64 113/73  Pulse: 87 80 70 70  Resp: 15 20 20 17   Temp: 98 F (36.7 C) 98 F (36.7 C) 98.6 F (37 C) 98.3 F (36.8 C)  TempSrc: Oral Oral Oral Oral  SpO2: 99% 98% 99% 97%    Intake/Output Summary (Last 24 hours) at 10/28/2021 0827 Last data filed at 10/28/2021 0630 Gross per 24 hour  Intake 1840 ml  Output 1150 ml  Net 690 ml   There were no vitals filed for this visit.  Examination:  Middle-age male no distress well-nourished no fever no chills EOMI NCAT no focal deficit Neck soft supple S1-S2 no murmur  no rub no gallop paced rhythm on monitors CTA B no added sound no rales rhonchi or wheeze Abdomen soft  nontender no rebound no guarding ROM intact no focal deficit Neurologically intact    Data Reviewed: personally reviewed   CBC    Component Value Date/Time   WBC 8.7 10/28/2021 0427   RBC 3.64 (L) 10/28/2021 0427   HGB 11.7 (L) 10/28/2021 0427   HGB 15.7 01/12/2020 1127   HCT 35.7 (L) 10/28/2021 0427   HCT 45.8 01/12/2020 1127   PLT 123 (L) 10/28/2021 0427   PLT 136 (L) 01/12/2020 1127   MCV 98.1 10/28/2021 0427   MCV 91 01/12/2020 1127   MCH 32.1 10/28/2021 0427   MCHC 32.8 10/28/2021 0427   RDW 14.3 10/28/2021 0427   RDW 12.1 01/12/2020 1127   LYMPHSABS 0.2 (L) 10/27/2021 1303   MONOABS 0.1 10/27/2021 1303   EOSABS 0.0 10/27/2021 1303   BASOSABS 0.0 10/27/2021 1303      Latest Ref Rng & Units 10/28/2021    4:27 AM 10/27/2021    1:03 PM 10/25/2021    5:04 PM  CMP  Glucose 70 - 99 mg/dL 86  141  89   BUN 8 - 23 mg/dL 29  25  23    Creatinine 0.61 - 1.24 mg/dL 1.83  1.79  1.51   Sodium 135 - 145 mmol/L 140  142  138   Potassium 3.5 - 5.1 mmol/L 4.5  3.8  4.1   Chloride 98 - 111 mmol/L 105  106  105   CO2 22 - 32 mmol/L 27  23  28    Calcium 8.9 - 10.3 mg/dL 8.8  9.2  9.0   Total Protein 6.5 - 8.1 g/dL 5.9  7.1    Total Bilirubin 0.3 - 1.2 mg/dL 0.7  0.7    Alkaline Phos 38 - 126 U/L 49  61    AST 15 - 41 U/L 14  17    ALT 0 - 44 U/L 11  13       Radiology Studies: CT Abdomen Pelvis Wo Contrast  Result Date: 10/27/2021 CLINICAL DATA:  Acute abdominal pain with vomiting. EXAM: CT ABDOMEN AND PELVIS WITHOUT CONTRAST TECHNIQUE: Multidetector CT imaging of the abdomen and pelvis was performed following the standard protocol without IV contrast. RADIATION DOSE REDUCTION: This exam was performed according to the departmental dose-optimization program which includes automated exposure control, adjustment of the mA and/or kV according to patient size and/or use of iterative reconstruction technique. COMPARISON:  CT abdomen and pelvis 08/29/2021 FINDINGS: Lower chest: The heart  is enlarged. There is scarring or atelectasis in the left lung base. Hepatobiliary: No focal liver abnormality is seen. No gallstones, gallbladder wall thickening, or biliary dilatation. Pancreas: Unremarkable. No pancreatic ductal dilatation or surrounding inflammatory changes. Spleen: Normal in size without focal abnormality. Adrenals/Urinary Tract: There is nonspecific bilateral perinephric fat stranding which is new from prior. There is a calculus in the lower pole the left kidney measuring up to 1 cm. No hydronephrosis. No ureteral or bladder calculi are seen. There are bilateral renal cysts measuring up to 3.8 cm which appears similar to the prior study. Stable 12 mm rounded hyperdense lesion in the inferior pole the left kidney. The adrenal glands and bladder are within normal limits. Stomach/Bowel: Stomach is within normal limits. Appendix appears normal. No evidence of bowel wall thickening, distention, or inflammatory changes. There is sigmoid colon diverticulosis. Vascular/Lymphatic: Aortic atherosclerosis. There is a 3.2 cm infrarenal abdominal aortic aneurysm  which is unchanged from prior. No enlarged abdominal or pelvic lymph nodes. Reproductive: Prostate is unremarkable. Other: No abdominal wall hernia or abnormality. No abdominopelvic ascites. There is subcutaneous edema in the medial left buttocks at the level of the sacrum. No focal fluid collection identified. Musculoskeletal: No fracture is seen. L2 vertebral body hemangioma noted. IMPRESSION: 1. Bilateral perinephric fat stranding of uncertain etiology. Please correlate clinically for infection involving the kidneys. 2. Subcutaneous stranding in the medial left buttocks may represent edema or infection. Correlate clinically. 3. Nonobstructing left renal calculus. 4. Stable hyperdense lesion in the inferior pole the left kidney measuring 12 mm. This can be further evaluated with MRI to exclude small solid lesion. 5. Colonic diverticulosis. 6. 3.2  cm infrarenal abdominal aortic aneurysm. Recommend follow-up every 3 years. Reference: J Am Coll Radiol 4496;75:916-384. Electronically Signed   By: Ronney Asters M.D.   On: 10/27/2021 15:38   DG Chest Portable 1 View  Result Date: 10/27/2021 CLINICAL DATA:  Lower abdominal pain. EXAM: PORTABLE CHEST 1 VIEW COMPARISON:  Two-view chest x-ray 10/25/2021 FINDINGS: The heart is enlarged. Pacing wires are stable. No edema or effusion is present lung volumes are low. The visualized soft tissues and bony thorax are unremarkable. IMPRESSION: 1. Stable cardiomegaly without failure. 2. Low lung volumes. Electronically Signed   By: San Morelle M.D.   On: 10/27/2021 14:22     Scheduled Meds:  amiodarone  200 mg Oral Daily   fluticasone furoate-vilanterol  1 puff Inhalation Daily   insulin aspart  0-9 Units Subcutaneous Q4H   pantoprazole  40 mg Oral Daily   PHENobarbital  64.8 mg Oral BID   pravastatin  40 mg Oral QPM   umeclidinium bromide  1 puff Inhalation Daily   warfarin  3 mg Oral q1600   Warfarin - Pharmacist Dosing Inpatient   Does not apply q1600   Continuous Infusions:  sodium chloride 75 mL/hr at 10/28/21 0951   ceFEPime (MAXIPIME) IV 2 g (10/28/21 0953)   lactated ringers 150 mL/hr at 10/27/21 1524   lactated ringers     potassium PHOSPHATE IVPB (in mmol) 15 mmol (10/28/21 0607)   vancomycin       LOS: 1 day   Time spent: [place], MD [facility] To contact the attending provider between 7A-7P or the covering provider during after hours 7P-7A, please log into the web site [email or web address] and access using universal [place] password for that web site. If you do not have the password, please call the hospital operator.  10/28/2021, 8:27 AM

## 2021-10-28 NOTE — Progress Notes (Addendum)
Pt had a rhythm change.  EKG done and it shows Afib RVR, please see results.  On call MD alerted, waiting for response.  No new orders at this time.

## 2021-10-29 DIAGNOSIS — N12 Tubulo-interstitial nephritis, not specified as acute or chronic: Secondary | ICD-10-CM

## 2021-10-29 LAB — BASIC METABOLIC PANEL
Anion gap: 10 (ref 5–15)
BUN: 24 mg/dL — ABNORMAL HIGH (ref 8–23)
CO2: 24 mmol/L (ref 22–32)
Calcium: 8.8 mg/dL — ABNORMAL LOW (ref 8.9–10.3)
Chloride: 108 mmol/L (ref 98–111)
Creatinine, Ser: 1.42 mg/dL — ABNORMAL HIGH (ref 0.61–1.24)
GFR, Estimated: 51 mL/min — ABNORMAL LOW (ref >60)
Glucose, Bld: 94 mg/dL (ref 70–99)
Potassium: 4 mmol/L (ref 3.5–5.1)
Sodium: 142 mmol/L (ref 135–145)

## 2021-10-29 LAB — GLUCOSE, CAPILLARY
Glucose-Capillary: 88 mg/dL (ref 70–99)
Glucose-Capillary: 153 mg/dL — ABNORMAL HIGH (ref 70–99)
Glucose-Capillary: 105 mg/dL — ABNORMAL HIGH (ref 70–99)

## 2021-10-29 LAB — CBC
HCT: 36.1 % — ABNORMAL LOW (ref 39.0–52.0)
Hemoglobin: 11.4 g/dL — ABNORMAL LOW (ref 13.0–17.0)
MCH: 31 pg (ref 26.0–34.0)
MCHC: 31.6 g/dL (ref 30.0–36.0)
MCV: 98.1 fL (ref 80.0–100.0)
Platelets: 114 10*3/uL — ABNORMAL LOW (ref 150–400)
RBC: 3.68 MIL/uL — ABNORMAL LOW (ref 4.22–5.81)
RDW: 13.9 % (ref 11.5–15.5)
WBC: 7.3 10*3/uL (ref 4.0–10.5)
nRBC: 0 % (ref 0.0–0.2)

## 2021-10-29 LAB — PROTIME-INR
INR: 2.5 — ABNORMAL HIGH (ref 0.8–1.2)
Prothrombin Time: 26.6 seconds — ABNORMAL HIGH (ref 11.4–15.2)

## 2021-10-29 MED ORDER — CARVEDILOL 12.5 MG PO TABS
12.5000 mg | ORAL_TABLET | Freq: Two times a day (BID) | ORAL | Status: DC
Start: 2021-10-29 — End: 2021-11-03
  Administered 2021-10-29 – 2021-11-03 (×10): 12.5 mg via ORAL
  Filled 2021-10-29 (×10): qty 1

## 2021-10-29 MED ORDER — CARVEDILOL 6.25 MG PO TABS
6.2500 mg | ORAL_TABLET | Freq: Two times a day (BID) | ORAL | Status: DC
  Administered 2021-10-29: 6.25 mg via ORAL
  Filled 2021-10-29: qty 1

## 2021-10-29 NOTE — Progress Notes (Signed)
[facility] ID notification # ,B - [id number]. Whitman Hero RN,BSN,CM  10/30/2021 @ 11:00 AM Call received from April, [place] transfer coordinator [facility] [place]).Pt active with [facility]. PCP: Charlyne Petrin, [place] [phone].  Whitman Hero RN,BSN,CM

## 2021-10-29 NOTE — Progress Notes (Signed)
PROGRESS NOTE   Ricky Hayden  OVF:[id number] DOB: 19-Jan-1946 DOA: 10/27/2021 PCP: [facility]  Brief Narrative:  76 year old with HFrEF with last EF 40-45% 08/2021-prior history of ICD placed 01/29/2020 for complete heart block, CKD 3 A.  Who was hospitalized 4/27--5/19 with septic shock secondary to Proteus bacteremia-felt to be secondary to nephrolithiasis with plan for outpatient cystoscopy and had cardiac arrest was aggressively diuresed 5/19-5/25 CIR stay but found to have wide-complex tachycardia?  A flutter/fib and was transferred back to hospital 5/25-started on amiodarone had TEE cardioversion--ultimately returned to CIR 5/31 and was discharged 6/9 home Returned to hospital 6/26 left flank pain dizziness febrile ill-appearing   Hospital-Problem based course  Sepsis on admission 2/2 acute pyelonephritis probably secondary to recurrent Proteus [place] pole L kidney 1 cm stone -cefazolin -May resume regular diet -Was scheduled for outpatient work-up with urologist: per Dr. Tresa Moore: state that the area in the kidneys does need addressing.  He will schedule an appt as OP for follow up-- needs a cystoscopy-does not recommend any MRI -culture unrevealing- may have to base abx on culture from 8/41  Chronic systolic heart failure EF 40-45% with prior  Underlying A-fib on Coumadin CHA2DS2-VASc >3--recent DCCV cardioversion Continue Coumadin INR therapeutic at 2.4 Continue amiodarone 200 daily, Pravachol 40 -resume and adjust coreg Would hold Jardiance going forward as risk for urinary infections  Hypodense lesion inferior pole 12 mm 10/2021 -urology follow up  Prior intracranial hemorrhage on chronic phenobarb Continue phenobarbital 64.8 twice daily  BPH Continue finasteride 2.5 daily  3.2 cm infrarenal abdominal aortic aneurysm. Recommend follow-up every 3 years.  DVT prophylaxis: coumadin Code Status: full Family Communication: none present at bedside Disposition:   Status is: Inpatient Remains inpatient appropriate because: needs IV abx     Consultants:  Telephone consulted Dr. Tresa Moore    Subjective: Concerned about his fast HR-- happened overnight  Objective: Vitals:   10/29/21 0448 10/29/21 0749 10/29/21 0945 10/29/21 1136  BP: (!) 115/93 (!) 123/98 122/88 114/81  Pulse:  (!) 123 (!) 128 (!) 130  Resp: 20 18 19  (!) 23  Temp:  98 F (36.7 C) 98.1 F (36.7 C) 98.2 F (36.8 C)  TempSrc:  Oral Oral Oral  SpO2: 99% 97% 97% 91%  Weight:      Height:        Intake/Output Summary (Last 24 hours) at 10/29/2021 1148 Last data filed at 10/29/2021 1025 Gross per 24 hour  Intake 1665.87 ml  Output 4100 ml  Net -2434.13 ml   Filed Weights   10/28/21 1548  Weight: 98.4 kg    Examination:   General: Appearance:    Obese male in no acute distress     Lungs:      respirations unlabored  Heart:    Tachycardic. Irregular   MS:   All extremities are intact.   Neurologic:   Awake, alert, oriented x 3. No apparent focal neurological           defect.       Data Reviewed: personally reviewed   CBC    Component Value Date/Time   WBC 7.3 10/29/2021 0337   RBC 3.68 (L) 10/29/2021 0337   HGB 11.4 (L) 10/29/2021 0337   HGB 15.7 01/12/2020 1127   HCT 36.1 (L) 10/29/2021 0337   HCT 45.8 01/12/2020 1127   PLT 114 (L) 10/29/2021 0337   PLT 136 (L) 01/12/2020 1127   MCV 98.1 10/29/2021 0337   MCV 91 01/12/2020 1127   MCH  31.0 10/29/2021 0337   MCHC 31.6 10/29/2021 0337   RDW 13.9 10/29/2021 0337   RDW 12.1 01/12/2020 1127   LYMPHSABS 0.2 (L) 10/27/2021 1303   MONOABS 0.1 10/27/2021 1303   EOSABS 0.0 10/27/2021 1303   BASOSABS 0.0 10/27/2021 1303      Latest Ref Rng & Units 10/29/2021    3:37 AM 10/28/2021    4:27 AM 10/27/2021    1:03 PM  CMP  Glucose 70 - 99 mg/dL 94  86  141   BUN 8 - 23 mg/dL 24  29  25    Creatinine 0.61 - 1.24 mg/dL 1.42  1.83  1.79   Sodium 135 - 145 mmol/L 142  140  142   Potassium 3.5 - 5.1 mmol/L 4.0   4.5  3.8   Chloride 98 - 111 mmol/L 108  105  106   CO2 22 - 32 mmol/L 24  27  23    Calcium 8.9 - 10.3 mg/dL 8.8  8.8  9.2   Total Protein 6.5 - 8.1 g/dL  5.9  7.1   Total Bilirubin 0.3 - 1.2 mg/dL  0.7  0.7   Alkaline Phos 38 - 126 U/L  49  61   AST 15 - 41 U/L  14  17   ALT 0 - 44 U/L  11  13      Radiology Studies: CT Abdomen Pelvis Wo Contrast  Result Date: 10/27/2021 CLINICAL DATA:  Acute abdominal pain with vomiting. EXAM: CT ABDOMEN AND PELVIS WITHOUT CONTRAST TECHNIQUE: Multidetector CT imaging of the abdomen and pelvis was performed following the standard protocol without IV contrast. RADIATION DOSE REDUCTION: This exam was performed according to the departmental dose-optimization program which includes automated exposure control, adjustment of the mA and/or kV according to patient size and/or use of iterative reconstruction technique. COMPARISON:  CT abdomen and pelvis 08/29/2021 FINDINGS: Lower chest: The heart is enlarged. There is scarring or atelectasis in the left lung base. Hepatobiliary: No focal liver abnormality is seen. No gallstones, gallbladder wall thickening, or biliary dilatation. Pancreas: Unremarkable. No pancreatic ductal dilatation or surrounding inflammatory changes. Spleen: Normal in size without focal abnormality. Adrenals/Urinary Tract: There is nonspecific bilateral perinephric fat stranding which is new from prior. There is a calculus in the lower pole the left kidney measuring up to 1 cm. No hydronephrosis. No ureteral or bladder calculi are seen. There are bilateral renal cysts measuring up to 3.8 cm which appears similar to the prior study. Stable 12 mm rounded hyperdense lesion in the inferior pole the left kidney. The adrenal glands and bladder are within normal limits. Stomach/Bowel: Stomach is within normal limits. Appendix appears normal. No evidence of bowel wall thickening, distention, or inflammatory changes. There is sigmoid colon diverticulosis.  Vascular/Lymphatic: Aortic atherosclerosis. There is a 3.2 cm infrarenal abdominal aortic aneurysm which is unchanged from prior. No enlarged abdominal or pelvic lymph nodes. Reproductive: Prostate is unremarkable. Other: No abdominal wall hernia or abnormality. No abdominopelvic ascites. There is subcutaneous edema in the medial left buttocks at the level of the sacrum. No focal fluid collection identified. Musculoskeletal: No fracture is seen. L2 vertebral body hemangioma noted. IMPRESSION: 1. Bilateral perinephric fat stranding of uncertain etiology. Please correlate clinically for infection involving the kidneys. 2. Subcutaneous stranding in the medial left buttocks may represent edema or infection. Correlate clinically. 3. Nonobstructing left renal calculus. 4. Stable hyperdense lesion in the inferior pole the left kidney measuring 12 mm. This can be further evaluated with MRI to exclude  small solid lesion. 5. Colonic diverticulosis. 6. 3.2 cm infrarenal abdominal aortic aneurysm. Recommend follow-up every 3 years. Reference: J Am Coll Radiol 9449;67:[id number]. Electronically Signed   By: Ronney Asters M.D.   On: 10/27/2021 15:38   DG Chest Portable 1 View  Result Date: 10/27/2021 CLINICAL DATA:  Lower abdominal pain. EXAM: PORTABLE CHEST 1 VIEW COMPARISON:  Two-view chest x-ray 10/25/2021 FINDINGS: The heart is enlarged. Pacing wires are stable. No edema or effusion is present lung volumes are low. The visualized soft tissues and bony thorax are unremarkable. IMPRESSION: 1. Stable cardiomegaly without failure. 2. Low lung volumes. Electronically Signed   By: San Morelle M.D.   On: 10/27/2021 14:22     Scheduled Meds:  amiodarone  200 mg Oral Daily   carvedilol  12.5 mg Oral BID WC   fluticasone furoate-vilanterol  1 puff Inhalation Daily   insulin aspart  0-9 Units Subcutaneous Q4H   pantoprazole  40 mg Oral Daily   PHENobarbital  64.8 mg Oral BID   pravastatin  40 mg Oral QPM    umeclidinium bromide  1 puff Inhalation Daily   warfarin  3 mg Oral q1600   Warfarin - Pharmacist Dosing Inpatient   Does not apply q1600   Continuous Infusions:  sodium chloride 75 mL/hr at 10/28/21 2054    ceFAZolin (ANCEF) IV 1 g (10/29/21 0615)     LOS: 2 days   Time spent: 35 min  Geradine Girt, DO [facility]   10/29/2021, 11:48 AM

## 2021-10-29 NOTE — Plan of Care (Signed)
  Problem: Education: Goal: Knowledge of General Education information will improve Description: Including pain rating scale, medication(s)/side effects and non-pharmacologic comfort measures Outcome: Progressing   Problem: Health Behavior/Discharge Planning: Goal: Ability to manage health-related needs will improve Outcome: Progressing   Problem: Clinical Measurements: Goal: Ability to maintain clinical measurements within normal limits will improve Outcome: Progressing Goal: Will remain free from infection Outcome: Progressing Goal: Diagnostic test results will improve Outcome: Progressing Goal: Respiratory complications will improve Outcome: Progressing Goal: Cardiovascular complication will be avoided Outcome: Progressing   Problem: Activity: Goal: Risk for activity intolerance will decrease Outcome: Progressing   Problem: Nutrition: Goal: Adequate nutrition will be maintained Outcome: Progressing   Problem: Coping: Goal: Level of anxiety will decrease Outcome: Progressing   Problem: Elimination: Goal: Will not experience complications related to bowel motility Outcome: Progressing Goal: Will not experience complications related to urinary retention Outcome: Progressing   Problem: Pain Managment: Goal: General experience of comfort will improve Outcome: Progressing   Problem: Safety: Goal: Ability to remain free from injury will improve Outcome: Progressing   Problem: Skin Integrity: Goal: Risk for impaired skin integrity will decrease Outcome: Progressing   Problem: Education: Goal: Ability to describe self-care measures that may prevent or decrease complications (Diabetes Survival Skills Education) will improve Outcome: Progressing Goal: Individualized Educational Video(s) Outcome: Progressing   Problem: Coping: Goal: Ability to adjust to condition or change in health will improve Outcome: Progressing   Problem: Fluid Volume: Goal: Ability to  maintain a balanced intake and output will improve Outcome: Progressing   Problem: Health Behavior/Discharge Planning: Goal: Ability to identify and utilize available resources and services will improve Outcome: Progressing Goal: Ability to manage health-related needs will improve Outcome: Progressing   Problem: Metabolic: Goal: Ability to maintain appropriate glucose levels will improve Outcome: Progressing   Problem: Nutritional: Goal: Maintenance of adequate nutrition will improve Outcome: Progressing Goal: Progress toward achieving an optimal weight will improve Outcome: Progressing   Problem: Skin Integrity: Goal: Risk for impaired skin integrity will decrease Outcome: Progressing   Problem: Tissue Perfusion: Goal: Adequacy of tissue perfusion will improve Outcome: Progressing   Problem: Fluid Volume: Goal: Hemodynamic stability will improve Outcome: Progressing   Problem: Clinical Measurements: Goal: Diagnostic test results will improve Outcome: Progressing Goal: Signs and symptoms of infection will decrease Outcome: Progressing   Problem: Respiratory: Goal: Ability to maintain adequate ventilation will improve Outcome: Progressing   

## 2021-10-29 NOTE — Progress Notes (Signed)
Notified Doc. About elevated HR 110'S-120"s Afib RVR. No new orders at this time

## 2021-10-29 NOTE — Progress Notes (Signed)
Physical Therapy Treatment Patient Details Name: Zakye Baby MRN: [id number] DOB: 05-Jul-1945 Today's Date: 10/29/2021   History of Present Illness Pt is a 76yo M admitted on 6/26 with complaints of lower abdominal pain and dizziness. Found to have sepsis and pyelonephritis. Pt recently DCd from [place] on 6/9. PMH: CKD, seizures, CAD, DM, afib, HLD, renal cysts, aortic anuerysm    PT Comments    Pt admitted with above diagnosis. Pt was able to ambulate with min guard assist to supervision with RW with good safety awareness overall.  Pt incr distance.  HR to 135 bpm and sats >94% on RA with activity.  Pt does become fatigued.   Pt currently with functional limitations due to balance and endurance deficits). Pt will benefit from skilled PT to increase their independence and safety with mobility to allow discharge to the venue listed below.      Recommendations for follow up therapy are one component of a multi-disciplinary discharge planning process, led by the attending physician.  Recommendations may be updated based on patient status, additional functional criteria and insurance authorization.  Follow Up Recommendations  Home health PT     Assistance Recommended at Discharge PRN  Patient can return home with the following A little help with bathing/dressing/bathroom;Assistance with cooking/housework;Assist for transportation;Help with stairs or ramp for entrance   Equipment Recommendations  None recommended by PT    Recommendations for Other Services       Precautions / Restrictions Precautions Precautions: Fall Precaution Comments: monitor O2 Restrictions Weight Bearing Restrictions: Yes     Mobility  Bed Mobility Overal bed mobility: Needs Assistance Bed Mobility: Supine to Sit     Supine to sit: Supervision     General bed mobility comments: Incr time for pt to come to EOB however did perform without physical assist.    Transfers Overall transfer level: Needs  assistance Equipment used: Rolling walker (2 wheels) Transfers: Sit to/from Stand Sit to Stand: Supervision           General transfer comment: No assist to come to RW    Ambulation/Gait Ambulation/Gait assistance: Min guard, Supervision Gait Distance (Feet): 200 Feet Assistive device: Rolling walker (2 wheels) Gait Pattern/deviations: Step-through pattern, Decreased stance time - left, Decreased step length - right, Antalgic Gait velocity: decreased Gait velocity interpretation: 1.31 - 2.62 ft/sec, indicative of limited community ambulator   General Gait Details: complaining of pain in L foot with decreased stance time. min guard to supervision for safety. no complaints of dizziness and no LOB in controlled environment.  Was fatigued toward end of walk.   Stairs             Wheelchair Mobility    Modified Rankin (Stroke Patients Only)       Balance Overall balance assessment: Mild deficits observed, not formally tested                                          Cognition Arousal/Alertness: Awake/alert Behavior During Therapy: WFL for tasks assessed/performed Overall Cognitive Status: Within Functional Limits for tasks assessed                                          Exercises General Exercises - Lower Extremity Ankle Circles/Pumps: AROM, Both, 10 reps, Seated Long Arc  Quad: AROM, Both, 10 reps, Seated Hip Flexion/Marching: AROM, Both, 10 reps, Seated    General Comments General comments (skin integrity, edema, etc.): HR to 135 bpm with average HR 128 bpm.  Sats >94% on RA with activity.      Pertinent Vitals/Pain Pain Assessment Pain Assessment: Faces Faces Pain Scale: Hurts little more Pain Location: L ankle Pain Descriptors / Indicators: Sore Pain Intervention(s): Limited activity within patient's tolerance, Monitored during session, Repositioned    Home Living                          Prior Function             PT Goals (current goals can now be found in the care plan section) Acute Rehab PT Goals Patient Stated Goal: to return home Progress towards PT goals: Progressing toward goals    Frequency    Min 3X/week      PT Plan Current plan remains appropriate    Co-evaluation              AM-PAC PT "6 Clicks" Mobility   Outcome Measure  Help needed turning from your back to your side while in a flat bed without using bedrails?: None Help needed moving from lying on your back to sitting on the side of a flat bed without using bedrails?: None Help needed moving to and from a bed to a chair (including a wheelchair)?: A Little Help needed standing up from a chair using your arms (e.g., wheelchair or bedside chair)?: A Little Help needed to walk in hospital room?: A Little Help needed climbing 3-5 steps with a railing? : A Lot 6 Click Score: 19    End of Session Equipment Utilized During Treatment: Gait belt Activity Tolerance: Patient tolerated treatment well;Patient limited by pain Patient left: in chair;with call bell/phone within reach Nurse Communication: Mobility status PT Visit Diagnosis: Other abnormalities of gait and mobility (R26.89);Difficulty in walking, not elsewhere classified (R26.2)     Time: 2355-7322 PT Time Calculation (min) (ACUTE ONLY): 16 min  Charges:  $Gait Training: 8-22 mins                     Mirinda Monte M,PT Acute Rehab Services [place] 10/29/2021, 11:39 AM

## 2021-10-29 NOTE — Progress Notes (Signed)
[place] for Warfarin Indication: atrial fibrillation  Allergies  Allergen Reactions   Calcium Channel Blockers Other (See Comments)    Came to hospital in 1995-caused chest pain     Patient Measurements: Height: 5' 7.99" (172.7 cm) Weight: 98.4 kg (217 lb) IBW/kg (Calculated) : 68.38  Vital Signs: Temp: 98 F (36.7 C) (06/28 0749) Temp Source: Oral (06/28 0749) BP: 123/98 (06/28 0749) Pulse Rate: 123 (06/28 0749)  Labs: Recent Labs    10/27/21 1303 10/27/21 2235 10/28/21 0427 10/29/21 0337  HGB 14.2  --  11.7* 11.4*  HCT 42.8  --  35.7* 36.1*  PLT 160  --  123* 114*  APTT  --  23*  --   --   LABPROT 22.2*  --  25.6* 26.6*  INR 2.0*  --  2.4* 2.5*  CREATININE 1.79*  --  1.83* 1.42*  CKTOTAL 39*  --   --   --     Estimated Creatinine Clearance: 49.5 mL/min (A) (by C-G formula based on SCr of 1.42 mg/dL (H)).   Assessment: 6 yom with a history of CKD, DM2, AF on warfarin. Patient presenting with abdominal pain and flank pain. Warfarin per pharmacy placed for atrial fibrillation.   PTA warfarin dose is 3mg  daily per medication history and last taken PTA 6/25.  Hgb stable 11.4, platelets 114. No signs bleeding reported. INR in goal at 2.5. Will continue PTA regimen.  Goal of Therapy:  INR 2-3 Monitor platelets by anticoagulation protocol: Yes   Plan:  Give warfarin 3 mg PO x1 dose Check INR daily while on warfarin Continue to monitor H&H and platelets    Thank you for allowing pharmacy to be a part of this patient's care.  Ardyth Harps, PharmD Clinical Pharmacist

## 2021-10-30 DIAGNOSIS — N12 Tubulo-interstitial nephritis, not specified as acute or chronic: Secondary | ICD-10-CM | POA: Diagnosis not present

## 2021-10-30 LAB — CBC
HCT: 36.5 % — ABNORMAL LOW (ref 39.0–52.0)
Hemoglobin: 11.6 g/dL — ABNORMAL LOW (ref 13.0–17.0)
MCH: 31.2 pg (ref 26.0–34.0)
MCHC: 31.8 g/dL (ref 30.0–36.0)
MCV: 98.1 fL (ref 80.0–100.0)
Platelets: 119 10*3/uL — ABNORMAL LOW (ref 150–400)
RBC: 3.72 MIL/uL — ABNORMAL LOW (ref 4.22–5.81)
RDW: 13.8 % (ref 11.5–15.5)
WBC: 6.7 10*3/uL (ref 4.0–10.5)
nRBC: 0 % (ref 0.0–0.2)

## 2021-10-30 LAB — BASIC METABOLIC PANEL
Anion gap: 9 (ref 5–15)
BUN: 19 mg/dL (ref 8–23)
CO2: 24 mmol/L (ref 22–32)
Calcium: 9.2 mg/dL (ref 8.9–10.3)
Chloride: 108 mmol/L (ref 98–111)
Creatinine, Ser: 1.19 mg/dL (ref 0.61–1.24)
GFR, Estimated: >60 mL/min (ref >60)
Glucose, Bld: 121 mg/dL — ABNORMAL HIGH (ref 70–99)
Potassium: 4.1 mmol/L (ref 3.5–5.1)
Sodium: 141 mmol/L (ref 135–145)

## 2021-10-30 LAB — GLUCOSE, CAPILLARY
Glucose-Capillary: 128 mg/dL — ABNORMAL HIGH (ref 70–99)
Glucose-Capillary: 127 mg/dL — ABNORMAL HIGH (ref 70–99)
Glucose-Capillary: 145 mg/dL — ABNORMAL HIGH (ref 70–99)
Glucose-Capillary: 136 mg/dL — ABNORMAL HIGH (ref 70–99)

## 2021-10-30 LAB — PROTIME-INR
INR: 2.2 — ABNORMAL HIGH (ref 0.8–1.2)
Prothrombin Time: 24.2 seconds — ABNORMAL HIGH (ref 11.4–15.2)

## 2021-10-30 MED ORDER — SACUBITRIL-VALSARTAN 24-26 MG PO TABS
1.0000 | ORAL_TABLET | Freq: Two times a day (BID) | ORAL | Status: DC
  Administered 2021-10-30 – 2021-11-03 (×9): 1 via ORAL
  Filled 2021-10-30 (×9): qty 1

## 2021-10-30 NOTE — Progress Notes (Signed)
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  Who was hospitalized 4/27--5/19 with septic shock secondary to Proteus bacteremia-felt to be secondary to nephrolithiasis with plan for outpatient cystoscopy and had cardiac arrest was aggressively diuresed 5/19-5/25 CIR stay but found to have wide-complex tachycardia?  A flutter/fib and was transferred back to hospital 5/25-started on amiodarone had TEE cardioversion--ultimately returned to CIR 5/31 and was discharged 6/9 home Returned to hospital 6/26 left flank pain dizziness febrile ill-appearing   Hospital-Problem based course  Sepsis on admission 2/2 acute pyelonephritis probably secondary to recurrent Proteus Lower pole L kidney 1 cm stone -cefazolin -May resume regular diet -Was scheduled for outpatient work-up with urologist: per Dr. Tresa Moore: state that the area in the kidneys does need addressing.  He will schedule an appt as OP for follow up-- needs a cystoscopy-does not recommend any MRI -culture unrevealing- may have to base abx on culture from 0/26  Chronic systolic heart failure EF 40-45% with prior  Underlying A-fib on Coumadin CHA2DS2-VASc >3--recent DCCV cardioversion Continue Coumadin INR therapeutic at 2.4 Continue amiodarone 200 daily, Pravachol 40 -resume and adjust coreg Would hold Jardiance going forward as risk for urinary infections  Hypodense lesion inferior pole 12 mm 10/2021 -urology follow up  Prior intracranial hemorrhage on chronic phenobarb Continue phenobarbital 64.8 twice daily  BPH Continue finasteride 2.5 daily  3.2 cm infrarenal abdominal aortic aneurysm. Recommend follow-up every 3 years.  DVT prophylaxis: coumadin Code Status: full Family Communication: none present at bedside Disposition:   Status is: Inpatient Remains inpatient appropriate because: needs IV abx     Consultants:  Telephone consulted Dr. Tresa Moore    Subjective: HR improved today Some SOB  Objective: Vitals:   10/30/21 0950 10/30/21 1132 10/30/21 1205 10/30/21 1305  BP: 115/87 99/79 (!) 114/102   Pulse: (!) 126 (!) 131 (!) 125 71  Resp: (!) 21 18 (!) 26 (!) 25  Temp:  98.3 F (36.8 C)    TempSrc:  Oral    SpO2: 96% 96% 96% 96%  Weight:      Height:        Intake/Output Summary (Last 24 hours) at 10/30/2021 1406 Last data filed at 10/30/2021 1100 Gross per 24 hour  Intake 490.56 ml  Output 2200 ml  Net -1709.44 ml   Filed Weights   10/28/21 1548  Weight: 98.4 kg    Examination:   General: Appearance:    Obese male in no acute distress     Lungs:      respirations unlabored  Heart:    Normal heart rate. Irregular   MS:   All extremities are intact.   Neurologic:   Awake, alert, oriented x 3. No apparent focal neurological           defect.       Data Reviewed: personally reviewed   CBC    Component Value Date/Time   WBC 6.7 10/30/2021 0357   RBC 3.72 (L) 10/30/2021 0357   HGB 11.6 (L) 10/30/2021 0357   HGB 15.7 01/12/2020 1127   HCT 36.5 (L) 10/30/2021 0357   HCT 45.8 01/12/2020 1127   PLT 119 (L) 10/30/2021 0357   PLT 136 (L) 01/12/2020 1127   MCV 98.1 10/30/2021 0357   MCV 91 01/12/2020 1127   MCH 31.2 10/30/2021 0357  MCHC 31.8 10/30/2021 0357   RDW 13.8 10/30/2021 0357   RDW 12.1 01/12/2020 1127   LYMPHSABS 0.2 (L) 10/27/2021 1303   MONOABS 0.1 10/27/2021 1303   EOSABS 0.0 10/27/2021 1303   BASOSABS 0.0 10/27/2021 1303      Latest Ref Rng & Units 10/30/2021    3:57 AM 10/29/2021    3:37 AM 10/28/2021    4:27 AM  CMP  Glucose 70 - 99 mg/dL 121  94  86   BUN 8 - 23 mg/dL 19  24  29    Creatinine 0.61 - 1.24 mg/dL 1.19  1.42  1.83   Sodium 135 - 145 mmol/L 141  142  140   Potassium 3.5 - 5.1 mmol/L 4.1  4.0  4.5   Chloride 98 - 111 mmol/L 108  108  105   CO2  22 - 32 mmol/L 24  24  27    Calcium 8.9 - 10.3 mg/dL 9.2  8.8  8.8   Total Protein 6.5 - 8.1 g/dL   5.9   Total Bilirubin 0.3 - 1.2 mg/dL   0.7   Alkaline Phos 38 - 126 U/L   49   AST 15 - 41 U/L   14   ALT 0 - 44 U/L   11      Radiology Studies: No results found.   Scheduled Meds:  amiodarone  200 mg Oral Daily   carvedilol  12.5 mg Oral BID WC   fluticasone furoate-vilanterol  1 puff Inhalation Daily   insulin aspart  0-9 Units Subcutaneous Q4H   pantoprazole  40 mg Oral Daily   PHENobarbital  64.8 mg Oral BID   pravastatin  40 mg Oral QPM   sacubitril-valsartan  1 tablet Oral BID   umeclidinium bromide  1 puff Inhalation Daily   warfarin  3 mg Oral q1600   Warfarin - Pharmacist Dosing Inpatient   Does not apply q1600   Continuous Infusions:   ceFAZolin (ANCEF) IV 1 g (10/30/21 1340)     LOS: 3 days   Time spent: 35 min  Geradine Girt, DO [facility]   10/30/2021, 2:06 PM

## 2021-10-30 NOTE — Progress Notes (Signed)
[facility] for Warfarin Indication: atrial fibrillation  Allergies  Allergen Reactions   Calcium Channel Blockers Other (See Comments)    Came to hospital in 1995-caused chest pain     Patient Measurements: Height: 5' 7.99" (172.7 cm) Weight: 98.4 kg (217 lb) IBW/kg (Calculated) : 68.38  Vital Signs: Temp: 98.3 F (36.8 C) (06/29 1132) Temp Source: Oral (06/29 1132) BP: 99/79 (06/29 1132) Pulse Rate: 131 (06/29 1132)  Labs: Recent Labs    10/27/21 1303 10/27/21 2235 10/28/21 0427 10/29/21 0337 10/30/21 0357  HGB 14.2  --  11.7* 11.4* 11.6*  HCT 42.8  --  35.7* 36.1* 36.5*  PLT 160  --  123* 114* 119*  APTT  --  23*  --   --   --   LABPROT 22.2*  --  25.6* 26.6* 24.2*  INR 2.0*  --  2.4* 2.5* 2.2*  CREATININE 1.79*  --  1.83* 1.42* 1.19  CKTOTAL 39*  --   --   --   --      Estimated Creatinine Clearance: 59.1 mL/min (by C-G formula based on SCr of 1.19 mg/dL).   Assessment: 76 yo M with a history of CKD, DM2, afib on warfarin. Patient presenting with abdominal pain and flank pain. Warfarin per pharmacy placed for atrial fibrillation.   PTA warfarin dose is 3mg  daily per medication history and last taken PTA 6/25.  Hgb stable 11.6, platelets 119. No signs bleeding reported. INR in goal at 2.2. Will continue PTA regimen.  Goal of Therapy:  INR 2-3 Monitor platelets by anticoagulation protocol: Yes   Plan:  Patient continues on warfarin 3 mg daily as PTA Will change INR checks to MWF Continue to monitor H&H and platelets    Thank you for allowing pharmacy to be a part of this patient's care. [employer], Pharm.D., BCPS Clinical Pharmacist  **Pharmacist phone directory can be found on [email or web address] listed under [place].  10/30/2021 12:19 PM

## 2021-10-30 NOTE — Progress Notes (Signed)
Vitals not crossing over from monitor.

## 2021-10-30 NOTE — Progress Notes (Signed)
Physical Therapy Treatment Patient Details Name: Ricky Hayden MRN: [id number] DOB: May 19, 1945 Today's Date: 10/30/2021   History of Present Illness Pt is a 76yo M admitted on 6/26 with complaints of lower abdominal pain and dizziness. Found to have sepsis and pyelonephritis. Pt recently DCd from [place] on 6/9. PMH: CKD, seizures, CAD, DM, afib, HLD, renal cysts, aortic anuerysm    PT Comments    The pt was agreeable to session despite reports of working with OT earlier in the day. The pt did not require physical assist, but continues to require increased time and effort to rise to standing, and is limited in gait progression due to L foot pain and fatigue. The pt's HR increased to max of 134bpm in afib rhythm with increased frequency of PVCs noted. SpO2 remained stable on RA. The pt will continue to benefit from skilled PT acutely to progress functional strength, power, and endurance to improve independence with mobility.     Recommendations for follow up therapy are one component of a multi-disciplinary discharge planning process, led by the attending physician.  Recommendations may be updated based on patient status, additional functional criteria and insurance authorization.  Follow Up Recommendations  Home health PT     Assistance Recommended at Discharge PRN  Patient can return home with the following A little help with bathing/dressing/bathroom;Assistance with cooking/housework;Assist for transportation;Help with stairs or ramp for entrance   Equipment Recommendations  None recommended by PT    Recommendations for Other Services       Precautions / Restrictions Precautions Precautions: Fall Precaution Comments: monitor O2 and HR (in afib) Restrictions Weight Bearing Restrictions: No     Mobility  Bed Mobility Overal bed mobility: Needs Assistance Bed Mobility: Supine to Sit     Supine to sit: Supervision     General bed mobility comments: Incr time for pt to come to  EOB however did perform without physical assist.    Transfers Overall transfer level: Needs assistance Equipment used: Rolling walker (2 wheels) Transfers: Sit to/from Stand Sit to Stand: Min guard           General transfer comment: increased time and effort, no assist given    Ambulation/Gait Ambulation/Gait assistance: Min guard Gait Distance (Feet): 250 Feet Assistive device: Rolling walker (2 wheels) Gait Pattern/deviations: Step-through pattern, Decreased stance time - left, Decreased step length - right, Antalgic Gait velocity: decreased Gait velocity interpretation: 1.31 - 2.62 ft/sec, indicative of limited community ambulator   General Gait Details: complaining of pain in L foot with decreased stance time. minG for safety, HR elevated with afib rhythm and increased rate of PVCs. Pt reports fatigue as limiting factor     Balance Overall balance assessment: Mild deficits observed, not formally tested                                          Cognition Arousal/Alertness: Awake/alert Behavior During Therapy: WFL for tasks assessed/performed Overall Cognitive Status: Within Functional Limits for tasks assessed                                 General Comments: pt following cues and answering questions appropriately.           General Comments General comments (skin integrity, edema, etc.): HR to max 134 bpm, SpO2 stable on RA.  Pertinent Vitals/Pain Pain Assessment Pain Assessment: Faces Faces Pain Scale: Hurts little more Pain Location: L ankle Pain Descriptors / Indicators: Sore Pain Intervention(s): Monitored during session, Repositioned     PT Goals (current goals can now be found in the care plan section) Acute Rehab PT Goals Patient Stated Goal: to return home PT Goal Formulation: With patient Time For Goal Achievement: 11/11/21 Potential to Achieve Goals: Good Progress towards PT goals: Progressing toward  goals    Frequency    Min 3X/week      PT Plan Current plan remains appropriate       AM-PAC PT "6 Clicks" Mobility   Outcome Measure  Help needed turning from your back to your side while in a flat bed without using bedrails?: None Help needed moving from lying on your back to sitting on the side of a flat bed without using bedrails?: None Help needed moving to and from a bed to a chair (including a wheelchair)?: A Little Help needed standing up from a chair using your arms (e.g., wheelchair or bedside chair)?: A Little Help needed to walk in hospital room?: A Little Help needed climbing 3-5 steps with a railing? : A Lot 6 Click Score: 19    End of Session Equipment Utilized During Treatment: Gait belt Activity Tolerance: Patient tolerated treatment well;Patient limited by pain Patient left: in chair;with call bell/phone within reach;with family/visitor present;with chair alarm set Nurse Communication: Mobility status (family wanting update) PT Visit Diagnosis: Other abnormalities of gait and mobility (R26.89);Difficulty in walking, not elsewhere classified (R26.2)     Time: 9476-5465 PT Time Calculation (min) (ACUTE ONLY): 28 min  Charges:  $Gait Training: 8-22 mins $Therapeutic Exercise: 8-22 mins                     [place], PT, DPT   Acute Rehabilitation Department   Sandra Cockayne 10/30/2021, 6:22 PM

## 2021-10-30 NOTE — Progress Notes (Signed)
Occupational Therapy Treatment Patient Details Name: Ricky Hayden MRN: [id number] DOB: Aug 19, 1945 Today's Date: 10/30/2021   History of present illness Pt is a 76yo M admitted on 6/26 with complaints of lower abdominal pain and dizziness. Found to have sepsis and pyelonephritis. Pt recently DCd from [place] on 6/9. PMH: CKD, seizures, CAD, DM, afib, HLD, renal cysts, aortic anuerysm   OT comments  Pt making good progress with OT goals. This session, pt completed extended mobility, as well as ADL's listed below with min A or less. He continues to report LLE pain, however he is able to work through it to complete tasks. Continuing to recommend HHOT and acute OT will continue to follow.    Recommendations for follow up therapy are one component of a multi-disciplinary discharge planning process, led by the attending physician.  Recommendations may be updated based on patient status, additional functional criteria and insurance authorization.    Follow Up Recommendations  Home health OT    Assistance Recommended at Discharge Set up Supervision/Assistance  Patient can return home with the following  A little help with bathing/dressing/bathroom;Assistance with cooking/housework;Assist for transportation;Help with stairs or ramp for entrance   Equipment Recommendations  None recommended by OT    Recommendations for Other Services      Precautions / Restrictions Precautions Precautions: Fall Precaution Comments: Watch HR Restrictions Weight Bearing Restrictions: No       Mobility Bed Mobility Overal bed mobility: Modified Independent             General bed mobility comments: no assist needed    Transfers Overall transfer level: Needs assistance Equipment used: Rolling walker (2 wheels) Transfers: Sit to/from Stand Sit to Stand: Supervision           General transfer comment: sup for toilet and reclienr     Balance Overall balance assessment: Mild deficits observed,  not formally tested                                         ADL either performed or assessed with clinical judgement   ADL Overall ADL's : Needs assistance/impaired     Grooming: Supervision/safety;Standing Grooming Details (indicate cue type and reason): completed all tasks at sink         Upper Body Dressing : Modified independent;Sitting Upper Body Dressing Details (indicate cue type and reason): doffed and donned gowns Lower Body Dressing: Minimal assistance;Sit to/from stand;Sitting/lateral leans Lower Body Dressing Details (indicate cue type and reason): needing some assist to reach feet Toilet Transfer: Supervision/safety;Ambulation;Regular Toilet;Rolling walker (2 wheels) Toilet Transfer Details (indicate cue type and reason): use of grab bar to slowly descend on toilet Toileting- Clothing Manipulation and Hygiene: Modified independent;Sitting/lateral lean Toileting - Clothing Manipulation Details (indicate cue type and reason): no assist needed     Functional mobility during ADLs: Supervision/safety;Rolling walker (2 wheels) General ADL Comments: Pt with improved mobility this session, as well as activity tolerance. Requiring less assist.    Extremity/Trunk Assessment              Vision       Perception     Praxis      Cognition Arousal/Alertness: Awake/alert Behavior During Therapy: WFL for tasks assessed/performed Overall Cognitive Status: Within Functional Limits for tasks assessed  Exercises      Shoulder Instructions       General Comments Max HR noted 135, at rest HR bouncing from 105-120, RN aware    Pertinent Vitals/ Pain       Pain Assessment Pain Assessment: Faces Faces Pain Scale: Hurts little more Pain Location: L ankle Pain Descriptors / Indicators: Sore Pain Intervention(s): Monitored during session, Repositioned  Home Living                                           Prior Functioning/Environment              Frequency  Min 2X/week        Progress Toward Goals  OT Goals(current goals can now be found in the care plan section)  Progress towards OT goals: Progressing toward goals  Acute Rehab OT Goals Patient Stated Goal: To go home OT Goal Formulation: With patient Time For Goal Achievement: 11/11/21 Potential to Achieve Goals: Good ADL Goals Pt Will Perform Lower Body Dressing: with modified independence;with adaptive equipment;sitting/lateral leans;sit to/from stand Pt/caregiver will Perform Home Exercise Program: Increased strength;Both right and left upper extremity;With theraband;Independently;With written HEP provided Additional ADL Goal #1: Pt to increase standing activity tolerance > 10 min during ADL/IADL tasks to maximize endurance  Plan Discharge plan remains appropriate;Frequency remains appropriate    Co-evaluation                 AM-PAC OT "6 Clicks" Daily Activity     Outcome Measure   Help from another person eating meals?: None Help from another person taking care of personal grooming?: A Little Help from another person toileting, which includes using toliet, bedpan, or urinal?: A Little Help from another person bathing (including washing, rinsing, drying)?: A Little Help from another person to put on and taking off regular upper body clothing?: None Help from another person to put on and taking off regular lower body clothing?: A Little 6 Click Score: 20    End of Session Equipment Utilized During Treatment: Rolling walker (2 wheels)  OT Visit Diagnosis: Unsteadiness on feet (R26.81);Other abnormalities of gait and mobility (R26.89)   Activity Tolerance Patient tolerated treatment well   Patient Left in bed;with call bell/phone within reach   Nurse Communication Mobility status        Time: 1040-1107 OT Time Calculation (min): 27 min  Charges: OT General  Charges $OT Visit: 1 Visit OT Treatments $Self Care/Home Management : 8-22 mins $Therapeutic Activity: 8-22 mins  Calyb Mcquarrie H., OTR/L Acute Rehabilitation  Louanne Calvillo Elane Minnie Shi 10/30/2021, 11:48 AM

## 2021-10-31 ENCOUNTER — Inpatient Hospital Stay (HOSPITAL_COMMUNITY): Admission: RE | Admit: 2021-10-31 | Payer: No Typology Code available for payment source | Source: Ambulatory Visit

## 2021-10-31 DIAGNOSIS — I5042 Chronic combined systolic (congestive) and diastolic (congestive) heart failure: Secondary | ICD-10-CM

## 2021-10-31 DIAGNOSIS — I480 Paroxysmal atrial fibrillation: Secondary | ICD-10-CM

## 2021-10-31 DIAGNOSIS — Z95810 Presence of automatic (implantable) cardiac defibrillator: Secondary | ICD-10-CM

## 2021-10-31 DIAGNOSIS — N12 Tubulo-interstitial nephritis, not specified as acute or chronic: Secondary | ICD-10-CM | POA: Diagnosis not present

## 2021-10-31 DIAGNOSIS — N179 Acute kidney failure, unspecified: Secondary | ICD-10-CM

## 2021-10-31 LAB — GLUCOSE, CAPILLARY
Glucose-Capillary: 110 mg/dL — ABNORMAL HIGH (ref 70–99)
Glucose-Capillary: 107 mg/dL — ABNORMAL HIGH (ref 70–99)
Glucose-Capillary: 116 mg/dL — ABNORMAL HIGH (ref 70–99)
Glucose-Capillary: 101 mg/dL — ABNORMAL HIGH (ref 70–99)

## 2021-10-31 LAB — BASIC METABOLIC PANEL WITH GFR
Anion gap: 9 (ref 5–15)
BUN: 18 mg/dL (ref 8–23)
CO2: 26 mmol/L (ref 22–32)
Calcium: 9.3 mg/dL (ref 8.9–10.3)
Chloride: 106 mmol/L (ref 98–111)
Creatinine, Ser: 1.27 mg/dL — ABNORMAL HIGH (ref 0.61–1.24)
GFR, Estimated: 58 mL/min — ABNORMAL LOW
Glucose, Bld: 87 mg/dL (ref 70–99)
Potassium: 4.3 mmol/L (ref 3.5–5.1)
Sodium: 141 mmol/L (ref 135–145)

## 2021-10-31 LAB — CBC
HCT: 36.9 % — ABNORMAL LOW (ref 39.0–52.0)
Hemoglobin: 12.1 g/dL — ABNORMAL LOW (ref 13.0–17.0)
MCH: 31.6 pg (ref 26.0–34.0)
MCHC: 32.8 g/dL (ref 30.0–36.0)
MCV: 96.3 fL (ref 80.0–100.0)
Platelets: 131 K/uL — ABNORMAL LOW (ref 150–400)
RBC: 3.83 MIL/uL — ABNORMAL LOW (ref 4.22–5.81)
RDW: 13.5 % (ref 11.5–15.5)
WBC: 5.1 K/uL (ref 4.0–10.5)
nRBC: 0 % (ref 0.0–0.2)

## 2021-10-31 LAB — PROTIME-INR
INR: 2.2 — ABNORMAL HIGH (ref 0.8–1.2)
Prothrombin Time: 24.6 seconds — ABNORMAL HIGH (ref 11.4–15.2)

## 2021-10-31 MED ORDER — LIP MEDEX EX OINT
TOPICAL_OINTMENT | CUTANEOUS | Status: DC | PRN
  Administered 2021-10-31: 1 via TOPICAL
  Filled 2021-10-31: qty 7

## 2021-10-31 NOTE — Plan of Care (Signed)
  Problem: Education: Goal: Knowledge of General Education information will improve Description: Including pain rating scale, medication(s)/side effects and non-pharmacologic comfort measures Outcome: Progressing   Problem: Health Behavior/Discharge Planning: Goal: Ability to manage health-related needs will improve Outcome: Progressing   Problem: Clinical Measurements: Goal: Ability to maintain clinical measurements within normal limits will improve Outcome: Progressing Goal: Will remain free from infection Outcome: Progressing Goal: Diagnostic test results will improve Outcome: Progressing Goal: Respiratory complications will improve Outcome: Progressing Goal: Cardiovascular complication will be avoided Outcome: Progressing   Problem: Activity: Goal: Risk for activity intolerance will decrease Outcome: Progressing   Problem: Nutrition: Goal: Adequate nutrition will be maintained Outcome: Progressing   Problem: Coping: Goal: Level of anxiety will decrease Outcome: Progressing   Problem: Elimination: Goal: Will not experience complications related to bowel motility Outcome: Progressing Goal: Will not experience complications related to urinary retention Outcome: Progressing   Problem: Pain Managment: Goal: General experience of comfort will improve Outcome: Progressing   Problem: Safety: Goal: Ability to remain free from injury will improve Outcome: Progressing   Problem: Skin Integrity: Goal: Risk for impaired skin integrity will decrease Outcome: Progressing   Problem: Education: Goal: Ability to describe self-care measures that may prevent or decrease complications (Diabetes Survival Skills Education) will improve Outcome: Progressing Goal: Individualized Educational Video(s) Outcome: Progressing   Problem: Coping: Goal: Ability to adjust to condition or change in health will improve Outcome: Progressing   Problem: Fluid Volume: Goal: Ability to  maintain a balanced intake and output will improve Outcome: Progressing   Problem: Health Behavior/Discharge Planning: Goal: Ability to identify and utilize available resources and services will improve Outcome: Progressing Goal: Ability to manage health-related needs will improve Outcome: Progressing   Problem: Metabolic: Goal: Ability to maintain appropriate glucose levels will improve Outcome: Progressing   Problem: Nutritional: Goal: Maintenance of adequate nutrition will improve Outcome: Progressing Goal: Progress toward achieving an optimal weight will improve Outcome: Progressing   Problem: Skin Integrity: Goal: Risk for impaired skin integrity will decrease Outcome: Progressing   Problem: Tissue Perfusion: Goal: Adequacy of tissue perfusion will improve Outcome: Progressing   Problem: Fluid Volume: Goal: Hemodynamic stability will improve Outcome: Progressing   Problem: Clinical Measurements: Goal: Diagnostic test results will improve Outcome: Progressing Goal: Signs and symptoms of infection will decrease Outcome: Progressing   Problem: Respiratory: Goal: Ability to maintain adequate ventilation will improve Outcome: Progressing   

## 2021-10-31 NOTE — Progress Notes (Signed)
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  Who was hospitalized 4/27--5/19 with septic shock secondary to Proteus bacteremia-felt to be secondary to nephrolithiasis with plan for outpatient cystoscopy and had cardiac arrest and was aggressively diuresed 5/19-5/25 CIR stay but found to have wide-complex tachycardia?  A flutter/fib and was transferred back to hospital 5/25-started on amiodarone had TEE cardioversion--ultimately returned to CIR 5/31 and was discharged 6/9 home. Returned to hospital 6/26 left flank pain dizziness febrile ill-appearing   Hospital-Problem based course  Sepsis on admission 2/2 acute pyelonephritis probably secondary to recurrent Proteus Lower pole L kidney 1 cm stone -cefazolin -resume regular diet -Was scheduled for outpatient work-up with urologist: per Dr. Tresa Moore: state that the area in the kidneys does need addressing.  He will schedule an appt as OP for follow up-- needs a cystoscopy-does not recommend any MRI -culture unrevealing- may have to base abx on culture from 1/77  Chronic systolic heart failure EF 40-45% with prior  Underlying A-fib on Coumadin CHA2DS2-VASc >3--recent DCCV cardioversion Continue Coumadin INR therapeutic at 2.4 Continue amiodarone 200 daily, Pravachol 40 -resumed and adjust coreg -with better HR control -entresto also resume 6/30 Would hold Jardiance going forward as risk for urinary infections -cardiology consult as ? 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 Recommend  follow-up every 3 years.  DVT prophylaxis: coumadin Code Status: full Family Communication: with patient's permission, called daughter Disposition:  Status is: Inpatient Remains inpatient appropriate because: needs IV abx     Consultants:  Telephone consulted Dr. Tresa Moore Cards consult    Subjective: Overall feels better  Objective: Vitals:   10/31/21 0506 10/31/21 0748 10/31/21 0751 10/31/21 1139  BP: 126/61  139/82 117/78  Pulse: 99  89 71  Resp: 20  19 20   Temp:   98.3 F (36.8 C) 98.3 F (36.8 C)  TempSrc:   Oral Oral  SpO2: 98% 94% 95% 96%  Weight:      Height:        Intake/Output Summary (Last 24 hours) at 10/31/2021 1145 Last data filed at 10/31/2021 0500 Gross per 24 hour  Intake 305 ml  Output 1150 ml  Net -845 ml   Filed Weights   10/28/21 1548  Weight: 98.4 kg    Examination:    General: Appearance:    Obese male in no acute distress     Lungs:      respirations unlabored  Heart:    HR controlled- appears to be v paced  MS:   All extremities are intact.   Neurologic:   Awake, alert        Data Reviewed: personally reviewed   CBC    Component Value Date/Time   WBC 5.1 10/31/2021 0501   RBC 3.83 (L) 10/31/2021 0501   HGB 12.1 (L) 10/31/2021 0501   HGB 15.7 01/12/2020 1127   HCT 36.9 (L) 10/31/2021 0501   HCT 45.8 01/12/2020 1127   PLT 131 (L) 10/31/2021 0501   PLT 136 (L) 01/12/2020 1127   MCV 96.3 10/31/2021 0501   MCV 91 01/12/2020 1127   MCH 31.6  10/31/2021 0501   MCHC 32.8 10/31/2021 0501   RDW 13.5 10/31/2021 0501   RDW 12.1 01/12/2020 1127   LYMPHSABS 0.2 (L) 10/27/2021 1303   MONOABS 0.1 10/27/2021 1303   EOSABS 0.0 10/27/2021 1303   BASOSABS 0.0 10/27/2021 1303      Latest Ref Rng & Units 10/31/2021    5:01 AM 10/30/2021    3:57 AM 10/29/2021    3:37 AM  CMP  Glucose 70 - 99 mg/dL 87  121  94   BUN 8 - 23 mg/dL 18  19  24    Creatinine 0.61 - 1.24 mg/dL 1.27  1.19  1.42   Sodium 135 - 145 mmol/L 141  141  142    Potassium 3.5 - 5.1 mmol/L 4.3  4.1  4.0   Chloride 98 - 111 mmol/L 106  108  108   CO2 22 - 32 mmol/L 26  24  24    Calcium 8.9 - 10.3 mg/dL 9.3  9.2  8.8      Radiology Studies: No results found.   Scheduled Meds:  amiodarone  200 mg Oral Daily   carvedilol  12.5 mg Oral BID WC   fluticasone furoate-vilanterol  1 puff Inhalation Daily   insulin aspart  0-9 Units Subcutaneous Q4H   pantoprazole  40 mg Oral Daily   PHENobarbital  64.8 mg Oral BID   pravastatin  40 mg Oral QPM   sacubitril-valsartan  1 tablet Oral BID   umeclidinium bromide  1 puff Inhalation Daily   warfarin  3 mg Oral q1600   Warfarin - Pharmacist Dosing Inpatient   Does not apply q1600   Continuous Infusions:   ceFAZolin (ANCEF) IV 1 g (10/31/21 0559)     LOS: 4 days   Time spent: 35 min  Geradine Girt, DO [facility]   10/31/2021, 11:45 AM

## 2021-10-31 NOTE — Progress Notes (Signed)
Physical Therapy Treatment Patient Details Name: Ricky Hayden MRN: [id number] DOB: 1945/11/09 Today's Date: 10/31/2021   History of Present Illness Pt is a 76yo M admitted on 6/26 with complaints of lower abdominal pain and dizziness. Found to have sepsis and pyelonephritis. Pt recently DCd from [place] on 6/9. PMH: CKD, seizures, CAD, DM, afib, HLD, renal cysts, aortic anuerysm    PT Comments    The pt was agreeable to session after encouragement, reports he is feeling very tired this admission due to his HR having been elevated. The pt was able to complete ~150 ft hallway ambulation with minG and use of RW for safety, no overt LOB or need for physical assistance. The pt does continue to use slowed speed and demos significant antalgic gait due to L foot and ankle pain with wt bearing. Pt reports pain has persisted for 6 months since last hospitalization but that it has never been looked at. Pt reports tenderness to touch at L foot, ankle, and calf, and has increased swelling compared to RLE. RN alerted. Will continue to benefit from skilled PT to progress functional strength, endurance, and complete stair training prior to anticipated return home.     Recommendations for follow up therapy are one component of a multi-disciplinary discharge planning process, led by the attending physician.  Recommendations may be updated based on patient status, additional functional criteria and insurance authorization.  Follow Up Recommendations  Home health PT     Assistance Recommended at Discharge PRN  Patient can return home with the following A little help with bathing/dressing/bathroom;Assistance with cooking/housework;Assist for transportation;Help with stairs or ramp for entrance   Equipment Recommendations  None recommended by PT    Recommendations for Other Services       Precautions / Restrictions Precautions Precautions: Fall Precaution Comments: monitor O2 and HR Restrictions Weight  Bearing Restrictions: No     Mobility  Bed Mobility Overal bed mobility: Needs Assistance Bed Mobility: Supine to Sit     Supine to sit: Supervision     General bed mobility comments: Incr time for pt to come to EOB however did perform without physical assist.    Transfers Overall transfer level: Needs assistance Equipment used: Rolling walker (2 wheels) Transfers: Sit to/from Stand Sit to Stand: Min guard           General transfer comment: increased time and effort, no assist given    Ambulation/Gait Ambulation/Gait assistance: Min guard Gait Distance (Feet): 150 Feet Assistive device: Rolling walker (2 wheels) Gait Pattern/deviations: Step-through pattern, Decreased stance time - left, Decreased step length - right, Antalgic Gait velocity: decreased Gait velocity interpretation: 1.31 - 2.62 ft/sec, indicative of limited community ambulator   General Gait Details: complaining of pain in L foot with decreased stance time. minG for safety, pt reports LLE at limiting factor      Balance Overall balance assessment: Mild deficits observed, not formally tested                                          Cognition Arousal/Alertness: Awake/alert Behavior During Therapy: WFL for tasks assessed/performed Overall Cognitive Status: Within Functional Limits for tasks assessed                                 General Comments: pt following cues and answering questions appropriately.  Exercises General Exercises - Lower Extremity Long Arc Quad: Strengthening, Both, 10 reps, Seated (against min resistance) Heel Slides: Strengthening, Both, 10 reps, Seated (against min resistance) Hip Flexion/Marching: AROM, Both, 10 reps, Seated Heel Raises: AROM, Both, 15 reps, Seated    General Comments General comments (skin integrity, edema, etc.): HR to 101 max      Pertinent Vitals/Pain Pain Assessment Pain Assessment: Faces Faces Pain  Scale: Hurts even more Pain Location: L ankle, foot, and calf Pain Descriptors / Indicators: Sore Pain Intervention(s): Limited activity within patient's tolerance, Monitored during session, Repositioned, Patient requesting pain meds-RN notified     PT Goals (current goals can now be found in the care plan section) Acute Rehab PT Goals Patient Stated Goal: to return home PT Goal Formulation: With patient Time For Goal Achievement: 11/11/21 Potential to Achieve Goals: Good Progress towards PT goals: Progressing toward goals    Frequency    Min 3X/week      PT Plan Current plan remains appropriate       AM-PAC PT "6 Clicks" Mobility   Outcome Measure  Help needed turning from your back to your side while in a flat bed without using bedrails?: None Help needed moving from lying on your back to sitting on the side of a flat bed without using bedrails?: None Help needed moving to and from a bed to a chair (including a wheelchair)?: A Little Help needed standing up from a chair using your arms (e.g., wheelchair or bedside chair)?: A Little Help needed to walk in hospital room?: A Little Help needed climbing 3-5 steps with a railing? : A Lot 6 Click Score: 19    End of Session Equipment Utilized During Treatment: Gait belt Activity Tolerance: Patient tolerated treatment well;Patient limited by pain Patient left: in chair;with call bell/phone within reach;with family/visitor present;with chair alarm set Nurse Communication: Mobility status (family wanting update) PT Visit Diagnosis: Other abnormalities of gait and mobility (R26.89);Difficulty in walking, not elsewhere classified (R26.2)     Time: 5997-7414 PT Time Calculation (min) (ACUTE ONLY): 37 min  Charges:  $Gait Training: 8-22 mins $Therapeutic Exercise: 8-22 mins                     West Carbo, PT, DPT   Acute Rehabilitation Department   Sandra Cockayne 10/31/2021, 12:18 PM

## 2021-10-31 NOTE — Consult Note (Addendum)
Cardiology Consultation:   Patient ID: Ricky Hayden MRN: [id number]; DOB: Mar 06, 1946  Admit date: 10/27/2021 Date of Consult: 10/31/2021  PCP:  Cl[facility], [facility] Cardiologist:  Glenetta Hew, MD  Electrophysiologist:  Virl Axe, MD       Patient Profile:   Ricky Hayden is a 76 y.o. male with a hx of atrial fibrillation/atrial flutter, NICM, s/p [facility] Jude CRT-P 2021, history of head bleed on phenobarbital, COPD on home O2, morbid obesity, history of seizure, CKD, hyperlipidemia, and DM2 who is being seen 10/31/2021 for the evaluation of atrial fibrillation at the request of Dr. Eliseo Squires.  History of Present Illness:   Ricky Hayden is a 76 year old male with past medical history of atrial fibrillation/atrial flutter, NICM, s/p [place] Jude CRT-P 2021, history of head bleed on phenobarbital, COPD on home O2, morbid obesity, history of seizure, CKD, hyperlipidemia, and DM2.  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  He was admitted to the hospital in April 2021 due to acute hypoxic respiratory failure and COPD.  EF was less than 20%.  Once the patient recovered, he underwent a left and right heart cath on 09/20/2019 that showed minimal CAD, nonischemic cardiomyopathy, mild to moderately elevated filling pressures with normal cardiac output.  Subsequent Zio patch monitor in July 2021 showed less than 1% PVCs.  Although previous office note mention CRT-D, however patient really has CRT-P instead.  His cardiomyopathy was felt to be related to bundle branch block, however it is also related to recurrent A-fib with RVR.  EF was 20 to 25% on echocardiogram in August 2021.  He underwent [employer] Jude dual-chamber pacemaker on 01/17/2020, a left bundle area pacing lead was placed a few days later on 01/29/2020 upgrading the dual-chamber pacemaker to CRT-P. Due to the need for chronic phenobarbital, and medication to  medication interaction, he is on Coumadin rather than NOAC.  He was admitted at [facility] in December 2022 due to acute CHF exacerbation and atrial flutter.  Patient had unsuccessful TEE DCCV on 12/3, he was successfully cardioverted with DCCV on 12/8 after IV diuresis.  TTE on 04/03/2021 mentioned EF moderate to severely reduced.  For a while, patient was being followed by [facility] cardiologist service.  Patient was admitted back to the hospital in April 2023 with acute respiratory failure, fever and chills.  He was placed on BiPAP for shortness of breath, urinalysis was concerning for urinary tract infection.  He was treated for urosepsis with IV antibiotic.  CT of the abdomen showed a nonobstructive renal stone and exophytic lesion of the left kidney.  He was supposed to follow-up with urology service.  Blood culture positive for Proteus and he was treated with 2 weeks of IV antibiotic.  TTE obtained on 08/29/2021 showed EF 40 to 45%.  On 09/02/2021, patient had decreased level of consciousness due to increased oxygen requirement, he required intubation and had brief PEA arrest after his intubation.  Chest x-ray concerning for aspiration with HCAP.  He then developed septic shock requiring IV fluid bolus and pressors.  Hospital course complicated by ATN secondary to urosepsis and shock.  Patient was eventually extubated 10 days later.  Hospital course was also complicated by occurrence of regular wide-complex tachycardia with heart rate up to 130.  Cardiology service consulted at the time and suspected the patient had atrial flutter with RVR.  He ultimately underwent TEE DCCV by Dr. Oval Linsey on 09/26/2021.  EF was  30 to 35% on TEE.  Previous home amiodarone was increased in dosage.  Previous Eliquis switched to Coumadin due to interaction with phenobarbital.  Since discharge, patient has been seen by Dr. Ellyn Hack on 10/20/2021, he had a left lower extremity edema.  Venous Doppler was ordered to rule out DVT.  He was also  felt to be mildly volume overloaded.  Spironolactone 12.5 mg daily was added to his medical regimen.  He was kept on 40 mg daily of torsemide with instruction of taking additional 20 mg as needed in the afternoon if weight increase by more than 3 pounds overnight or 5 pounds in a single week.  Patient was seen in the ED on 10/25/2021 for left lower extremity edema, venous Doppler was negative.  Patient was holding sinus rhythm at the time.  He returned back to the hospital on 10/27/2021 due to abdominal pain and flank pain and was diagnosed with pyelonephritis.  Initial creatinine was 1.79, compared to his previous baseline of 1.2.  Patient has been started on IV antibiotic.  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  On arrival to the hospital, he was back in atrial fibrillation/atrial flutter with RVR.  However he was able to convert back to normal sinus rhythm briefly in the morning of 6/27 before going back to A-fib with RVR again.  Home torsemide and spironolactone were held.  Carvedilol was increased from 6.25 to 12.5 mg twice a day.  Patient was able to self convert back to normal sinus rhythm around 9:28 PM on 10/30/2021.  Due to recurrent urinary tract infection and kidney infection, it was decided to discontinue the home Jardiance given its potential side effect of increased urinary infection risk.  Cardiology service consulted for A-fib at the request of family.   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 Sherwood Gambler   Nocturia    Obesity    Pansinusitis    a.  complicated by brain abscess and bleeding requiring craniotomy in 1995.   PONV (postoperative nausea and vomiting)    Sinusitis    s/p ethmoidectomy and nasal septoplasty   Vertigo    intermitantly    Past Surgical History:  Procedure Laterality Date   BIV UPGRADE N/A 01/29/2020   Procedure: BIV UPGRADE;  Surgeon: Evans Lance, MD;  Location: [place] [facility];  Service: Cardiovascular;  Laterality: N/A;   CARDIOVERSION N/A 09/26/2021   Procedure: CARDIOVERSION;  Surgeon: [name], MD;  Location: [facility];  Service: Cardiovascular;  Laterality: N/A;   CIRCUMCISION N/A 11/20/2013   Procedure: CIRCUMCISION ADULT;  Surgeon: Claybon Jabs, MD;  Location: [facility];  Service: Urology;  Laterality: N/A;   [place]   hematomy due to sinus infection    PACEMAKER IMPLANT N/A 01/17/2020   Procedure: PACEMAKER IMPLANT;  Surgeon: Deboraha Sprang, MD;  Location: [place] [facility];  Service: Cardiovascular;  Laterality: N/A;   RIGHT/LEFT HEART CATH AND CORONARY ANGIOGRAPHY N/A 09/20/2019   Procedure: RIGHT/LEFT HEART CATH AND CORONARY ANGIOGRAPHY;  Surgeon: Jolaine Artist, MD;  Location: [place] [facility];  Service: Cardiovascular;  Laterality: N/A;   shoulder surg rt   1995   SINUS SURGERY WITH INSTATRAK     ethmoidectomy and nasal septum repair   TEE WITHOUT CARDIOVERSION N/A 09/26/2021   Procedure: TRANSESOPHAGEAL ECHOCARDIOGRAM (TEE);  Surgeon: [name], MD;   Location: [facility];  Service: Cardiovascular;  Laterality: N/A;     Home Medications:  Prior to Admission medications   Medication Sig Start Date End Date Taking? Authorizing Provider  albuterol (PROVENTIL HFA;VENTOLIN HFA) 108 (90 BASE) MCG/ACT inhaler Inhale 2 puffs into the lungs every 6 (six) hours as needed for wheezing. 12/10/14  Yes Maryellen Pile, MD  amiodarone (PACERONE) 200 MG tablet Take one pill twice a day till 06/12. Then decrease to one pill daily starting 06/13 Patient taking differently: Take 200 mg by mouth daily. 10/10/21  Yes Love, Ivan Anchors, PA-C  carvedilol (COREG) 6.25 MG tablet Take 1 tablet (6.25 mg total) by mouth 2 (two) times daily with a meal. 10/10/21  Yes Love, Ivan Anchors, PA-C  empagliflozin (JARDIANCE) 10 MG TABS tablet Take 1 tablet (10 mg total) by mouth daily before breakfast. 10/10/21  Yes Love, Ivan Anchors, PA-C  finasteride (PROSCAR) 5 MG tablet Take 0.5 tablets (2.5 mg total) by mouth daily. 10/10/21  Yes Love, Ivan Anchors, PA-C  fluticasone furoate-vilanterol (BREO ELLIPTA) 200-25 MCG/ACT AEPB Inhale 1 puff into the lungs daily. 10/10/21  Yes Love, Ivan Anchors, PA-C  melatonin 3 MG TABS tablet Take 1 tablet (3 mg total) by mouth at bedtime. 10/10/21  Yes Love, Ivan Anchors, PA-C  Multiple Vitamins-Minerals (ICAPS AREDS 2 PO) Take 2 tablets by mouth daily.   Yes [provider]  pantoprazole (PROTONIX) 40 MG tablet Take 1 tablet (40 mg total) by mouth daily. 10/10/21  Yes Love, Ivan Anchors, PA-C  PHENobarbital (LUMINAL) 64.8 MG tablet Take 1 tablet (64.8 mg total) by mouth 2 (two) times daily. 07/24/15  Yes Corky Sox, MD  polyethylene glycol (MIRALAX / GLYCOLAX) 17 g packet Take 17 g by mouth 2 (two) times daily. Patient taking differently: Take 17 g by mouth daily as needed for mild constipation. 10/10/21  Yes Love, Ivan Anchors, PA-C  pravastatin (PRAVACHOL) 40 MG tablet Take 1 tablet (40 mg total) by mouth every evening. 10/10/21  Yes Love, Ivan Anchors, PA-C  sacubitril-valsartan  (ENTRESTO) 24-26 MG Take 1 tablet by mouth 2 (two) times daily. 10/10/21  Yes Love, Ivan Anchors, PA-C  spironolactone (ALDACTONE) 25 MG tablet Take 0.5 tablets (12.5 mg total) by mouth daily. 10/20/21  Yes Leonie Man, MD  torsemide (DEMADEX) 20 MG tablet Take 2 tablets (40 mg total) by mouth daily. MAy take an additional 20 mg  or 40 mg in the afternoon if 3 lbs or 5 lbs overnight weight gain. 10/20/21  Yes Leonie Man, MD  traMADol (ULTRAM) 50 MG tablet Take 1 tablet (50 mg total)  by mouth every 12 (twelve) hours as needed for moderate pain. 10/10/21  Yes Love, Ivan Anchors, PA-C  umeclidinium bromide (INCRUSE ELLIPTA) 62.5 MCG/ACT AEPB Inhale 1 puff into the lungs daily. 10/10/21  Yes Love, Ivan Anchors, PA-C  Vitamin D3 (VITAMIN D) 25 MCG tablet Take 1 tablet (1,000 Units total) by mouth daily. 10/10/21  Yes Love, Ivan Anchors, PA-C  warfarin (COUMADIN) 3 MG tablet Take 1 tablet (3 mg total) by mouth daily at 4 PM. 10/10/21  Yes Love, Ivan Anchors, PA-C  acetaminophen (TYLENOL) 325 MG tablet Take 1-2 tablets (325-650 mg total) by mouth every 4 (four) hours as needed for mild pain. Patient not taking: Reported on 10/27/2021 10/10/21   Bary Leriche, PA-C    Inpatient Medications: Scheduled Meds:  amiodarone  200 mg Oral Daily   carvedilol  12.5 mg Oral BID WC   fluticasone furoate-vilanterol  1 puff Inhalation Daily   insulin aspart  0-9 Units Subcutaneous Q4H   pantoprazole  40 mg Oral Daily   PHENobarbital  64.8 mg Oral BID   pravastatin  40 mg Oral QPM   sacubitril-valsartan  1 tablet Oral BID   umeclidinium bromide  1 puff Inhalation Daily   warfarin  3 mg Oral q1600   Warfarin - Pharmacist Dosing Inpatient   Does not apply q1600   Continuous Infusions:   ceFAZolin (ANCEF) IV 1 g (10/31/21 1522)   PRN Meds: acetaminophen **OR** acetaminophen, albuterol, HYDROcodone-acetaminophen, traMADol  Allergies:    Allergies  Allergen Reactions   Calcium Channel Blockers Other (See Comments)    Came to  hospital in 1995-caused chest pain     Social History:   Social History   Socioeconomic History   Marital status: Widowed    Spouse name: Not on file   Number of children: Not on file   Years of education: Not on file   Highest education level: Not on file  Occupational History   Not on file  Tobacco Use   Smoking status: Former    Types: Cigarettes    Quit date: 05/04/1986    Years since quitting: 35.5   Smokeless tobacco: Never  Vaping Use   Vaping Use: Never used  Substance and Sexual Activity   Alcohol use: No    Alcohol/week: 0.0 standard drinks of alcohol   Drug use: No   Sexual activity: Yes  Other Topics Concern   Not on file  Social History Narrative   Not on file   Social Determinants of Health   Financial Resource Strain: Not on file  Food Insecurity: Not on file  Transportation Needs: Not on file  Physical Activity: Not on file  Stress: Not on file  Social Connections: Not on file  Intimate Partner Violence: Not on file    Family History:    Family History  Problem Relation Age of Onset   Heart failure Mother 36   Heart attack Father 53     ROS:  Please see the history of present illness.   All other ROS reviewed and negative.     Physical Exam/Data:   Vitals:   10/31/21 0748 10/31/21 0751 10/31/21 1139 10/31/21 1539  BP:  139/82 117/78 121/76  Pulse:  89 71 69  Resp:  19 20 19   Temp:  98.3 F (36.8 C) 98.3 F (36.8 C) 98.3 F (36.8 C)  TempSrc:  Oral Oral Oral  SpO2: 94% 95% 96% 98%  Weight:      Height:  Intake/Output Summary (Last 24 hours) at 10/31/2021 1652 Last data filed at 10/31/2021 0500 Gross per 24 hour  Intake 245 ml  Output 1150 ml  Net -905 ml      10/28/2021    3:48 PM 10/25/2021    4:48 PM 10/20/2021    2:58 PM  Last 3 Weights  Weight (lbs) 217 lb 217 lb 223 lb  Weight (kg) 98.431 kg 98.431 kg 101.152 kg     Body mass index is 33 kg/m.  General:  Well nourished, well developed, in no acute  distress HEENT: normal Neck: no JVD Vascular: No carotid bruits; Distal pulses 2+ bilaterally Cardiac:  normal S1, S2; RRR; no murmur  Lungs:  clear to auscultation bilaterally, no wheezing, rhonchi or rales  Abd: soft, nontender, no hepatomegaly  Ext: no edema Musculoskeletal:  No deformities, BUE and BLE strength normal and equal Skin: warm and dry  Neuro:  CNs 2-12 intact, no focal abnormalities noted Psych:  Normal affect   EKG:  The EKG was personally reviewed and demonstrates:  atrial fibrillation Telemetry:  Telemetry was personally reviewed and demonstrates:  afib converted to NSR last night around 9:28PM, some sinus rhythm with PACs this morning, now atrial sensed ventricularly paced rhythm  Relevant CV Studies:  TEE 08/29/2021 1. Left ventricular ejection fraction, by estimation, is 40 to 45%. The  left ventricle has mildly decreased function. The left ventricle has no  regional wall motion abnormalities. There is mild left ventricular  hypertrophy. Left ventricular diastolic  parameters are consistent with Grade I diastolic dysfunction (impaired  relaxation).   2. Right ventricular systolic function is normal. The right ventricular  size is normal.   3. The mitral valve is normal in structure. No evidence of mitral valve  regurgitation. No evidence of mitral stenosis.   4. The aortic valve is normal in structure. There is moderate  calcification of the aortic valve. There is moderate thickening of the  aortic valve. Aortic valve regurgitation is not visualized. Aortic valve  sclerosis/calcification is present, without any   evidence of aortic stenosis.   5. Aortic dilatation noted. There is mild dilatation of the ascending  aorta, measuring 41 mm.   6. The inferior vena cava is normal in size with greater than 50%  respiratory variability, suggesting right atrial pressure of 3 mmHg.   Comparison(s): Prior images reviewed side by side. The left ventricular  function  has improved.      TEE 09/26/2021 1. Left ventricular ejection fraction, by estimation, is 30 to 35%. The  left ventricle has moderately decreased function. The left ventricle  demonstrates global hypokinesis.   2. Right ventricular systolic function is normal. The right ventricular  size is normal.   3. No left atrial/left atrial appendage thrombus was detected.   4. The mitral valve is normal in structure. Trivial mitral valve  regurgitation. No evidence of mitral stenosis.   5. The aortic valve is tricuspid. Aortic valve regurgitation is trivial.  No aortic stenosis is present.   6. The inferior vena cava is normal in size with greater than 50%  respiratory variability, suggesting right atrial pressure of 3 mmHg.   Conclusion(s)/Recommendation(s): Normal biventricular function without  evidence of hemodynamically significant valvular heart disease.   Laboratory Data:  High Sensitivity Troponin:  No results for input(s): "TROPONINIHS" in the last 720 hours.   Chemistry Recent Labs  Lab 10/27/21 1303 10/28/21 0427 10/29/21 0337 10/30/21 0357 10/31/21 0501  NA 142   < > 142  141 141  K 3.8   < > 4.0 4.1 4.3  CL 106   < > 108 108 106  CO2 23   < > 24 24 26   GLUCOSE 141*   < > 94 121* 87  BUN 25*   < > 24* 19 18  CREATININE 1.79*   < > 1.42* 1.19 1.27*  CALCIUM 9.2   < > 8.8* 9.2 9.3  MG 2.0  --   --   --   --   GFRNONAA 39*   < > 51* >60 58*  ANIONGAP 13   < > 10 9 9    < > = values in this interval not displayed.    Recent Labs  Lab 10/27/21 1303 10/28/21 0427  PROT 7.1 5.9*  ALBUMIN 3.6 3.0*  AST 17 14*  ALT 13 11  ALKPHOS 61 49  BILITOT 0.7 0.7   Lipids No results for input(s): "CHOL", "TRIG", "HDL", "LABVLDL", "LDLCALC", "CHOLHDL" in the last 168 hours.  Hematology Recent Labs  Lab 10/29/21 0337 10/30/21 0357 10/31/21 0501  WBC 7.3 6.7 5.1  RBC 3.68* 3.72* 3.83*  HGB 11.4* 11.6* 12.1*  HCT 36.1* 36.5* 36.9*  MCV 98.1 98.1 96.3  MCH 31.0 31.2 31.6   MCHC 31.6 31.8 32.8  RDW 13.9 13.8 13.5  PLT 114* 119* 131*   Thyroid  Recent Labs  Lab 10/27/21 1303  TSH 3.345    BNPNo results for input(s): "BNP", "PROBNP" in the last 168 hours.  DDimer No results for input(s): "DDIMER" in the last 168 hours.   Radiology/Studies:  No results found.   Assessment and Plan:   Paroxysmal A-fib/atrial flutter with RVR: Patient previously underwent cardioversion for A-fib in December at [facility] and TEE cardioversion for atrial flutter in May.  Had a recurrence of atrial fibrillation in the setting of pyelonephritis.  Carvedilol has increased to 12.5 mg twice a day.  Continue amiodarone.  Patient self converted back to sinus rhythm around 9:28 PM last night.  He is currently holding sinus rhythm.  He has no cardiac awareness of A-fib other than fatigue.  Continue Coumadin  Pyelonephritis with recent recurrent urinary infection: Agree with discontinuation of Jardiance to lower risk for future urinary infections.  Acute renal insufficiency: Renal function has returned back to baseline  Nonischemic cardiomyopathy: Carvedilol has been increased to 12.5 mg twice a day for better rate control.  Continue Entresto.  Consider reinitiation of spironolactone and torsemide.  Will discuss with MD regarding timing.  I think he can tolerate 12.5 mg spironolactone now.  As far as torsemide, may restart at previous 40mg  daily of torsemide this weekend. He does appears to be euvolemic on physical exam, however according to her daughter, he walks a very fine line between too wet and too dry, and can quickly go into heart failure. I/O -3L, however per nurse, likely not very accurate.  We will need a daily weight  History of [employer] biventricular pacemaker 6045  Chronic systolic heart failure: Over the years, his ejection fraction has been as low as 25% in 2021 and as high as 40 to 45% in April 2023.  Most recent TEE on 09/26/2021 suggested EF 30 to 35%, this was  performed during admission for atrial flutter with RVR.  History of cranial bleed on phenobarbital: Due to interaction between phenobarbital and NOAC, patient has been switched to Coumadin since May 2023.  COPD: On home oxygen  Hyperlipidemia: pravastatin  DM2  Morbid obesity  3.2 cm  infrarenal AAA: follow up as outpatient.      Risk Assessment/Risk Scores:        New York Heart Association (NYHA) Functional Class NYHA Class III  CHA2DS2-VASc Score = 5   This indicates a 7.2% annual risk of stroke. The patient's score is based upon: CHF History: 1 HTN History: 1 Diabetes History: 1 Stroke History: 0 Vascular Disease History: 0 Age Score: 2 Gender Score: 0         For questions or updates, please contact [place] Please consult [email or web address] for contact info under    Signed, Almyra Deforest, [place]  10/31/2021 4:52 PM  Patient seen and examined and agree with Almyra Deforest, PA as detailed above.  In brief, the patient is a 77 y.o. male with a hx of atrial fibrillation/atrial flutter, NICM, s/p [place] Jude CRT-P 2021, history of head bleed on phenobarbital, COPD on home O2, morbid obesity, history of seizure, CKD, hyperlipidemia, and DM2 who presented with pyelonephritis with course complicated by Afib with RVR for which Cardiology has been consulted.  Patient has known history of Afib and NICM s/p [employer] Jude CRT-P. Has had multiple admissions for sepsis with courses complicated by Afib with RVR at that time as detailed in HPI. He returns on this admission with pyelonephritis with course complicated by recurrent Afib with RVR. His coreg was increased from 6.25mg  to 12.5mg  BID and patient has converted back to NSR. His jardiacne has been discontinued due to pyelonephritis and his spiro/torsemide have initially been held.  Overall, he is clinically improved and remains in NSR today. Will continue current medications. Can likely add back home spironolactone and torsemide  tomorrow.  GEN: No acute distress. Sitting in bed Neck: No JVD Cardiac: RRR, no murmurs, rubs, or gallops.  Respiratory: Clear to auscultation bilaterally. GI: Obese, soft MS: No edema; No deformity. Neuro:  Nonfocal  Psych: Normal affect    Plan: -Continue amiodarone 200mg  daily and coreg 12.5mg  BID -Continue warfarin -Agree with stopping jardiance given pyelonephritis -Continue entresto 24/26mg  BID -Can likely add back home spironolactone and torsemide tomorrow -Management of pyelonephritis per primary  Cardiology will sign-off. Please feel free to call with questions or concerns.  Gwyndolyn Kaufman, MD

## 2021-10-31 NOTE — Progress Notes (Signed)
Pts daughter would like cardiology consulted. She is concerned about the strength of his heart and is concerned about his rate control. She also would like a call from the attending. She stated that she hasn't heard from a MD in three days.

## 2021-10-31 NOTE — Progress Notes (Signed)
[facility] for Warfarin Indication: atrial fibrillation  Allergies  Allergen Reactions   Calcium Channel Blockers Other (See Comments)    Came to hospital in 1995-caused chest pain     Patient Measurements: Height: 5' 7.99" (172.7 cm) Weight: 98.4 kg (217 lb) IBW/kg (Calculated) : 68.38  Vital Signs: Temp: 98.3 F (36.8 C) (06/30 1139) Temp Source: Oral (06/30 1139) BP: 117/78 (06/30 1139) Pulse Rate: 71 (06/30 1139)  Labs: Recent Labs    10/29/21 0337 10/30/21 0357 10/31/21 0501  HGB 11.4* 11.6* 12.1*  HCT 36.1* 36.5* 36.9*  PLT 114* 119* 131*  LABPROT 26.6* 24.2* 24.6*  INR 2.5* 2.2* 2.2*  CREATININE 1.42* 1.19 1.27*     Estimated Creatinine Clearance: 55.4 mL/min (A) (by C-G formula based on SCr of 1.27 mg/dL (H)).   Assessment: 76 yo M with a history of CKD, DM2, afib on warfarin. Patient presenting with abdominal pain and flank pain. Warfarin per pharmacy placed for atrial fibrillation.   PTA warfarin dose is 3mg  daily per medication history and last taken PTA 6/25.  Hgb stable 11-12s, platelets 131. No signs bleeding reported. INR in goal at 2.2. Will continue PTA regimen.  Goal of Therapy:  INR 2-3 Monitor platelets by anticoagulation protocol: Yes   Plan:  Patient continues on warfarin 3 mg daily as PTA INR checks MWF Continue to monitor H&H and platelets    Thank you for involving pharmacy in this patient's care.  Renold Genta, PharmD, BCPS Clinical Pharmacist Clinical phone for 10/31/2021 until 3p is [phone] 10/31/2021 12:44 PM

## 2021-11-01 DIAGNOSIS — I5042 Chronic combined systolic (congestive) and diastolic (congestive) heart failure: Secondary | ICD-10-CM | POA: Diagnosis not present

## 2021-11-01 DIAGNOSIS — R109 Unspecified abdominal pain: Secondary | ICD-10-CM | POA: Diagnosis not present

## 2021-11-01 DIAGNOSIS — I10 Essential (primary) hypertension: Secondary | ICD-10-CM

## 2021-11-01 DIAGNOSIS — N12 Tubulo-interstitial nephritis, not specified as acute or chronic: Secondary | ICD-10-CM | POA: Diagnosis not present

## 2021-11-01 LAB — GLUCOSE, CAPILLARY
Glucose-Capillary: 112 mg/dL — ABNORMAL HIGH (ref 70–99)
Glucose-Capillary: 115 mg/dL — ABNORMAL HIGH (ref 70–99)
Glucose-Capillary: 116 mg/dL — ABNORMAL HIGH (ref 70–99)
Glucose-Capillary: 154 mg/dL — ABNORMAL HIGH (ref 70–99)
Glucose-Capillary: 95 mg/dL (ref 70–99)
Glucose-Capillary: 96 mg/dL (ref 70–99)
Glucose-Capillary: 98 mg/dL (ref 70–99)

## 2021-11-01 LAB — CULTURE, BLOOD (ROUTINE X 2)
Culture: NO GROWTH
Culture: NO GROWTH
Special Requests: ADEQUATE
Special Requests: ADEQUATE

## 2021-11-01 LAB — BASIC METABOLIC PANEL
Anion gap: 6 (ref 5–15)
BUN: 13 mg/dL (ref 8–23)
CO2: 25 mmol/L (ref 22–32)
Calcium: 9.2 mg/dL (ref 8.9–10.3)
Chloride: 109 mmol/L (ref 98–111)
Creatinine, Ser: 1.15 mg/dL (ref 0.61–1.24)
GFR, Estimated: >60 mL/min (ref >60)
Glucose, Bld: 96 mg/dL (ref 70–99)
Potassium: 4.3 mmol/L (ref 3.5–5.1)
Sodium: 140 mmol/L (ref 135–145)

## 2021-11-01 LAB — CBC
HCT: 37.8 % — ABNORMAL LOW (ref 39.0–52.0)
Hemoglobin: 12.1 g/dL — ABNORMAL LOW (ref 13.0–17.0)
MCH: 30.9 pg (ref 26.0–34.0)
MCHC: 32 g/dL (ref 30.0–36.0)
MCV: 96.4 fL (ref 80.0–100.0)
Platelets: 134 10*3/uL — ABNORMAL LOW (ref 150–400)
RBC: 3.92 MIL/uL — ABNORMAL LOW (ref 4.22–5.81)
RDW: 13.2 % (ref 11.5–15.5)
WBC: 4.3 10*3/uL (ref 4.0–10.5)
nRBC: 0 % (ref 0.0–0.2)

## 2021-11-01 MED ORDER — FINASTERIDE 5 MG PO TABS
2.5000 mg | ORAL_TABLET | Freq: Every day | ORAL | Status: DC
  Administered 2021-11-01 – 2021-11-03 (×3): 2.5 mg via ORAL
  Filled 2021-11-01 (×4): qty 0.5

## 2021-11-01 MED ORDER — TORSEMIDE 20 MG PO TABS
40.0000 mg | ORAL_TABLET | Freq: Every day | ORAL | Status: DC
  Administered 2021-11-01 – 2021-11-03 (×3): 40 mg via ORAL
  Filled 2021-11-01 (×3): qty 2

## 2021-11-01 MED ORDER — SPIRONOLACTONE 12.5 MG HALF TABLET
12.5000 mg | ORAL_TABLET | Freq: Every day | ORAL | Status: DC
  Administered 2021-11-01 – 2021-11-03 (×3): 12.5 mg via ORAL
  Filled 2021-11-01 (×4): qty 1

## 2021-11-01 NOTE — Progress Notes (Signed)
Occupational Therapy Treatment Patient Details Name: Ricky Hayden MRN: [id number] DOB: 1946/03/16 Today's Date: 11/01/2021   History of present illness Pt is a 76yo M admitted on 6/26 with complaints of lower abdominal pain and dizziness. Found to have sepsis and pyelonephritis. Pt recently DCd from [place] on 6/9. PMH: CKD, seizures, CAD, DM, afib, HLD, renal cysts, aortic anuerysm   OT comments  Pt making good progress with functional goals. Pt's son present this session and very supportive. Pt with c/o L knee, ankle and foot pain; RN in and made aware. Pt walked to bathroom with RW with Sup, transferred to toilet sup, managed clothing while standing with Sup and stood at sink for grooming/hygiene tasks. OT will continue to follow acutely to maximize level of function and safety   Recommendations for follow up therapy are one component of a multi-disciplinary discharge planning process, led by the attending physician.  Recommendations may be updated based on patient status, additional functional criteria and insurance authorization.    Follow Up Recommendations  Home health OT    Assistance Recommended at Discharge Set up Supervision/Assistance  Patient can return home with the following  A little help with bathing/dressing/bathroom;Assistance with cooking/housework;Assist for transportation;Help with stairs or ramp for entrance   Equipment Recommendations  None recommended by OT    Recommendations for Other Services      Precautions / Restrictions Precautions Precautions: Fall Precaution Comments: monitor O2 and HR Restrictions Weight Bearing Restrictions: No       Mobility Bed Mobility Overal bed mobility: Needs Assistance Bed Mobility: Supine to Sit     Supine to sit: Supervision          Transfers Overall transfer level: Needs assistance Equipment used: Rolling walker (2 wheels) Transfers: Sit to/from Stand Sit to Stand: Min guard           General transfer  comment: increased time and effort, no physical assist given     Balance Overall balance assessment: Mild deficits observed, not formally tested                                         ADL either performed or assessed with clinical judgement   ADL Overall ADL's : Needs assistance/impaired     Grooming: Wash/dry face;Wash/dry hands;Standing;Supervision/safety Grooming Details (indicate cue type and reason): completed all tasks at sink             Lower Body Dressing: Minimal assistance;Sit to/from stand;Sitting/lateral leans Lower Body Dressing Details (indicate cue type and reason): simulated seated EOB Toilet Transfer: Supervision/safety;Ambulation;Regular Toilet;Rolling walker (2 wheels)   Toileting- Clothing Manipulation and Hygiene: Supervision/safety;Sit to/from stand       Functional mobility during ADLs: Supervision/safety;Rolling walker (2 wheels)      Extremity/Trunk Assessment Upper Extremity Assessment Upper Extremity Assessment: Overall WFL for tasks assessed   Lower Extremity Assessment Lower Extremity Assessment: Defer to PT evaluation   Cervical / Trunk Assessment Cervical / Trunk Assessment: Normal    Vision Baseline Vision/History: 1 Wears glasses Ability to See in Adequate Light: 0 Adequate Patient Visual Report: No change from baseline     Perception     Praxis      Cognition Arousal/Alertness: Awake/alert Behavior During Therapy: WFL for tasks assessed/performed Overall Cognitive Status: Within Functional Limits for tasks assessed  Exercises      Shoulder Instructions       General Comments      Pertinent Vitals/ Pain       Pain Assessment Pain Assessment: 0-10 Pain Score: 5  Pain Location: L knee, ankle, foot Pain Descriptors / Indicators: Sore, Radiating Pain Intervention(s): Monitored during session, Limited activity within patient's tolerance,  Repositioned  Home Living                                          Prior Functioning/Environment              Frequency  Min 2X/week        Progress Toward Goals  OT Goals(current goals can now be found in the care plan section)  Progress towards OT goals: Progressing toward goals     Plan Discharge plan remains appropriate;Frequency remains appropriate    Co-evaluation                 AM-PAC OT "6 Clicks" Daily Activity     Outcome Measure   Help from another person eating meals?: None Help from another person taking care of personal grooming?: A Little Help from another person toileting, which includes using toliet, bedpan, or urinal?: A Little Help from another person bathing (including washing, rinsing, drying)?: A Little Help from another person to put on and taking off regular upper body clothing?: None Help from another person to put on and taking off regular lower body clothing?: A Little 6 Click Score: 20    End of Session Equipment Utilized During Treatment: Rolling walker (2 wheels);Gait belt  OT Visit Diagnosis: Unsteadiness on feet (R26.81);Other abnormalities of gait and mobility (R26.89);Pain Pain - Right/Left: Left Pain - part of body: Knee;Ankle and joints of foot   Activity Tolerance Patient tolerated treatment well   Patient Left with call bell/phone within reach;in chair;with nursing/sitter in room;with family/visitor present   Nurse Communication          Time: 2446-2863 OT Time Calculation (min): 34 min  Charges: OT General Charges $OT Visit: 1 Visit OT Treatments $Self Care/Home Management : 8-22 mins $Therapeutic Activity: 8-22 mins   Britt Bottom 11/01/2021, 12:23 PM

## 2021-11-01 NOTE — Progress Notes (Signed)
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  Who was hospitalized 4/27--5/19 with septic shock secondary to Proteus bacteremia-felt to be secondary to nephrolithiasis with plan for outpatient cystoscopy and had cardiac arrest and was aggressively diuresed 5/19-5/25 CIR stay but found to have wide-complex tachycardia?  A flutter/fib and was transferred back to hospital 5/25-started on amiodarone had TEE cardioversion--ultimately returned to CIR 5/31 and was discharged 6/9 home. Returned to hospital 6/26 left flank pain dizziness febrile ill-appearing   Hospital-Problem based course  Sepsis on admission 2/2 acute pyelonephritis probably secondary to recurrent Proteus Lower pole L kidney 1 cm stone -cefazolin -resume regular diet -Was scheduled for outpatient work-up with urologist: per Dr. Tresa Moore: state that the area in the kidneys does need addressing.  He will schedule an appt as OP for follow up-- needs a cystoscopy-does not recommend any MRI -culture unrevealing- may have to base abx on culture from 4/17  Chronic systolic heart failure EF 40-45% with prior  Underlying A-fib on Coumadin CHA2DS2-VASc >3--recent DCCV cardioversion Continue Coumadin INR therapeutic at 2.4 Continue amiodarone 200 daily, Pravachol 40 -resumed and adjust coreg -with better HR control -resume home GDMT today Would hold Jardiance going forward as risk for urinary infections -cardiology consult appreciated   Hypodense lesion inferior pole 12 mm 10/2021 -urology follow up  Prior intracranial hemorrhage on chronic phenobarb Continue phenobarbital 64.8 twice daily  BPH Continue finasteride 2.5 daily  3.2 cm infrarenal abdominal aortic aneurysm. Recommend follow-up every 3 years.  DVT prophylaxis:  coumadin Code Status: full Family Communication: son at bedside Disposition:  Status is: Inpatient Remains inpatient appropriate because: home 24-48 hours     Consultants:  Telephone consulted Dr. Tresa Moore Cards consult    Subjective: Breathing better  Objective: Vitals:   11/01/21 0028 11/01/21 0500 11/01/21 0730 11/01/21 1030  BP: 121/72   124/75  Pulse: 71   68  Resp: 17   16  Temp: 98.5 F (36.9 C)  97.7 F (36.5 C) 97.7 F (36.5 C)  TempSrc: Axillary  Oral Oral  SpO2: 99%  97% 96%  Weight:  99 kg    Height:        Intake/Output Summary (Last 24 hours) at 11/01/2021 1206 Last data filed at 11/01/2021 0900 Gross per 24 hour  Intake 360 ml  Output 2100 ml  Net -1740 ml   Filed Weights   10/28/21 1548 11/01/21 0500  Weight: 98.4 kg 99 kg    Examination:    General: Appearance:    Obese male in no acute distress     Lungs:     respirations unlabored  Heart:    Normal heart rate.   MS:   All extremities are intact.   Neurologic:   Awake, alert, oriented x 3. No apparent focal neurological           defect.           Data Reviewed: personally reviewed   CBC    Component Value Date/Time   WBC 4.3 11/01/2021 0203   RBC 3.92 (L) 11/01/2021 0203   HGB 12.1 (L) 11/01/2021 0203   HGB 15.7 01/12/2020 1127   HCT 37.8 (L) 11/01/2021 0203   HCT 45.8 01/12/2020 1127   PLT 134 (L) 11/01/2021 0203   PLT 136 (L) 01/12/2020 1127   MCV  96.4 11/01/2021 0203   MCV 91 01/12/2020 1127   MCH 30.9 11/01/2021 0203   MCHC 32.0 11/01/2021 0203   RDW 13.2 11/01/2021 0203   RDW 12.1 01/12/2020 1127   LYMPHSABS 0.2 (L) 10/27/2021 1303   MONOABS 0.1 10/27/2021 1303   EOSABS 0.0 10/27/2021 1303   BASOSABS 0.0 10/27/2021 1303      Latest Ref Rng & Units 11/01/2021    2:03 AM 10/31/2021    5:01 AM 10/30/2021    3:57 AM  CMP  Glucose 70 - 99 mg/dL 96  87  121   BUN 8 - 23 mg/dL 13  18  19    Creatinine 0.61 - 1.24 mg/dL 1.15  1.27  1.19   Sodium 135 - 145 mmol/L 140   141  141   Potassium 3.5 - 5.1 mmol/L 4.3  4.3  4.1   Chloride 98 - 111 mmol/L 109  106  108   CO2 22 - 32 mmol/L 25  26  24    Calcium 8.9 - 10.3 mg/dL 9.2  9.3  9.2      Radiology Studies: No results found.   Scheduled Meds:  amiodarone  200 mg Oral Daily   carvedilol  12.5 mg Oral BID WC   finasteride  2.5 mg Oral Daily   fluticasone furoate-vilanterol  1 puff Inhalation Daily   insulin aspart  0-9 Units Subcutaneous Q4H   pantoprazole  40 mg Oral Daily   PHENobarbital  64.8 mg Oral BID   pravastatin  40 mg Oral QPM   sacubitril-valsartan  1 tablet Oral BID   spironolactone  12.5 mg Oral Daily   torsemide  40 mg Oral Daily   umeclidinium bromide  1 puff Inhalation Daily   warfarin  3 mg Oral q1600   Warfarin - Pharmacist Dosing Inpatient   Does not apply q1600   Continuous Infusions:   ceFAZolin (ANCEF) IV 1 g (11/01/21 0607)     LOS: 5 days   Time spent: 35 min  Geradine Girt, DO [facility]   11/01/2021, 12:06 PM

## 2021-11-01 NOTE — Plan of Care (Signed)

## 2021-11-02 ENCOUNTER — Inpatient Hospital Stay (HOSPITAL_COMMUNITY): Payer: No Typology Code available for payment source

## 2021-11-02 DIAGNOSIS — N12 Tubulo-interstitial nephritis, not specified as acute or chronic: Secondary | ICD-10-CM | POA: Diagnosis not present

## 2021-11-02 LAB — GLUCOSE, CAPILLARY
Glucose-Capillary: 94 mg/dL (ref 70–99)
Glucose-Capillary: 106 mg/dL — ABNORMAL HIGH (ref 70–99)
Glucose-Capillary: 116 mg/dL — ABNORMAL HIGH (ref 70–99)
Glucose-Capillary: 104 mg/dL — ABNORMAL HIGH (ref 70–99)
Glucose-Capillary: 135 mg/dL — ABNORMAL HIGH (ref 70–99)

## 2021-11-02 LAB — CBC
HCT: 38.1 % — ABNORMAL LOW (ref 39.0–52.0)
Hemoglobin: 12.6 g/dL — ABNORMAL LOW (ref 13.0–17.0)
MCH: 30.8 pg (ref 26.0–34.0)
MCHC: 33.1 g/dL (ref 30.0–36.0)
MCV: 93.2 fL (ref 80.0–100.0)
Platelets: 159 10*3/uL (ref 150–400)
RBC: 4.09 MIL/uL — ABNORMAL LOW (ref 4.22–5.81)
RDW: 13.1 % (ref 11.5–15.5)
WBC: 5.9 10*3/uL (ref 4.0–10.5)
nRBC: 0 % (ref 0.0–0.2)

## 2021-11-02 LAB — BASIC METABOLIC PANEL
Anion gap: 7 (ref 5–15)
BUN: 14 mg/dL (ref 8–23)
CO2: 27 mmol/L (ref 22–32)
Calcium: 9.4 mg/dL (ref 8.9–10.3)
Chloride: 106 mmol/L (ref 98–111)
Creatinine, Ser: 1.19 mg/dL (ref 0.61–1.24)
GFR, Estimated: >60 mL/min (ref >60)
Glucose, Bld: 106 mg/dL — ABNORMAL HIGH (ref 70–99)
Potassium: 3.8 mmol/L (ref 3.5–5.1)
Sodium: 140 mmol/L (ref 135–145)

## 2021-11-02 LAB — MAGNESIUM: Magnesium: 2 mg/dL (ref 1.7–2.4)

## 2021-11-02 MED ORDER — PANTOPRAZOLE SODIUM 40 MG PO TBEC
40.0000 mg | DELAYED_RELEASE_TABLET | Freq: Two times a day (BID) | ORAL | Status: DC
  Administered 2021-11-02 – 2021-11-03 (×2): 40 mg via ORAL
  Filled 2021-11-02 (×2): qty 1

## 2021-11-02 MED ORDER — CEFADROXIL 500 MG PO CAPS
1000.0000 mg | ORAL_CAPSULE | Freq: Two times a day (BID) | ORAL | Status: DC
  Administered 2021-11-02 – 2021-11-03 (×3): 1000 mg via ORAL
  Filled 2021-11-02 (×3): qty 2

## 2021-11-02 NOTE — Plan of Care (Signed)

## 2021-11-02 NOTE — Progress Notes (Signed)
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  Who was hospitalized 4/27--5/19 with septic shock secondary to Proteus bacteremia-felt to be secondary to nephrolithiasis with plan for outpatient cystoscopy and had cardiac arrest and was aggressively diuresed 5/19-5/25 CIR stay but found to have wide-complex tachycardia?  A flutter/fib and was transferred back to hospital 5/25-started on amiodarone had TEE cardioversion--ultimately returned to CIR 5/31 and was discharged 6/9 home. Returned to hospital 6/26 left flank pain dizziness febrile ill-appearing   Hospital-Problem based course  Sepsis on admission 2/2 acute pyelonephritis probably secondary to recurrent Proteus Lower pole L kidney 1 cm stone -change to PO abx -resume regular diet -Was scheduled for outpatient work-up with urologist: per Dr. Tresa Moore: state that the area in the kidneys does need addressing.  He will schedule an appt as OP for follow up-- needs a cystoscopy-does not recommend any MRI -culture unrevealing- will base on prior culture  Chronic systolic heart failure EF 40-45% with prior  Underlying A-fib on Coumadin CHA2DS2-VASc >3--recent DCCV cardioversion Continue Coumadin INR therapeutic at 2.4 Continue amiodarone 200 daily, Pravachol 40 -resumed and adjust coreg -with better HR control -resume home GDMT today Would hold Jardiance going forward as risk for urinary infections -cardiology consult appreciated   Hypodense lesion inferior pole 12 mm 10/2021 -urology follow up  Prior intracranial hemorrhage on chronic phenobarb Continue phenobarbital 64.8 twice daily  BPH Continue finasteride 2.5 daily  3.2 cm infrarenal abdominal aortic aneurysm. Recommend follow-up every 3 years.  DVT prophylaxis: coumadin Code  Status: full Family Communication: son at bedside Disposition:  Status is: Inpatient Remains inpatient appropriate because: home in AM     Consultants:  Telephone consulted Dr. Tresa Moore Cards consult    Subjective: Feeling more SOB this AM  Objective: Vitals:   11/01/21 2050 11/02/21 0500 11/02/21 0559 11/02/21 0758  BP: 126/89  128/88 131/76  Pulse: 70   75  Resp:    19  Temp: 98.6 F (37 C)  97.6 F (36.4 C) 98.2 F (36.8 C)  TempSrc:   Oral Axillary  SpO2: 96%   97%  Weight:  98.1 kg    Height:        Intake/Output Summary (Last 24 hours) at 11/02/2021 1310 Last data filed at 11/02/2021 0326 Gross per 24 hour  Intake --  Output 3475 ml  Net -3475 ml   Filed Weights   10/28/21 1548 11/01/21 0500 11/02/21 0500  Weight: 98.4 kg 99 kg 98.1 kg    Examination:    General: Appearance:    Obese male in no acute distress     Lungs:     Occ wheeze, respirations unlabored  Heart:    Normal heart rate.   MS:   All extremities are intact.   Neurologic:   Awake, alert, oriented x 3. No apparent focal neurological           defect.           Data Reviewed: personally reviewed   CBC    Component Value Date/Time   WBC 5.9 11/02/2021 0101   RBC 4.09 (L) 11/02/2021 0101   HGB 12.6 (L) 11/02/2021 0101   HGB 15.7 01/12/2020 1127   HCT 38.1 (L) 11/02/2021 0101   HCT 45.8 01/12/2020 1127   PLT 159 11/02/2021 0101   PLT 136 (  L) 01/12/2020 1127   MCV 93.2 11/02/2021 0101   MCV 91 01/12/2020 1127   MCH 30.8 11/02/2021 0101   MCHC 33.1 11/02/2021 0101   RDW 13.1 11/02/2021 0101   RDW 12.1 01/12/2020 1127   LYMPHSABS 0.2 (L) 10/27/2021 1303   MONOABS 0.1 10/27/2021 1303   EOSABS 0.0 10/27/2021 1303   BASOSABS 0.0 10/27/2021 1303      Latest Ref Rng & Units 11/02/2021    1:01 AM 11/01/2021    2:03 AM 10/31/2021    5:01 AM  CMP  Glucose 70 - 99 mg/dL 106  96  87   BUN 8 - 23 mg/dL 14  13  18    Creatinine 0.61 - 1.24 mg/dL 1.19  1.15  1.27   Sodium 135 - 145  mmol/L 140  140  141   Potassium 3.5 - 5.1 mmol/L 3.8  4.3  4.3   Chloride 98 - 111 mmol/L 106  109  106   CO2 22 - 32 mmol/L 27  25  26    Calcium 8.9 - 10.3 mg/dL 9.4  9.2  9.3      Radiology Studies: DG CHEST PORT 1 VIEW  Result Date: 11/02/2021 CLINICAL DATA:  Wheezing. EXAM: PORTABLE CHEST 1 VIEW COMPARISON:  Multiple priors, including radiographs 10/27/2021 and 10/25/2021. FINDINGS: 1048 hours. Stable cardiomegaly. Left subclavian pacemaker leads appear unchanged. There are persistent low lung volumes with chronic left basilar atelectasis. No edema, confluent airspace opacity, pneumothorax or significant pleural effusion. The bones appear unchanged. IMPRESSION: Stable chest.  No evidence of acute cardiopulmonary process. Electronically Signed   By: Richardean Sale M.D.   On: 11/02/2021 12:48     Scheduled Meds:  amiodarone  200 mg Oral Daily   carvedilol  12.5 mg Oral BID WC   cefadroxil  1,000 mg Oral BID   finasteride  2.5 mg Oral Daily   fluticasone furoate-vilanterol  1 puff Inhalation Daily   insulin aspart  0-9 Units Subcutaneous Q4H   pantoprazole  40 mg Oral Daily   PHENobarbital  64.8 mg Oral BID   pravastatin  40 mg Oral QPM   sacubitril-valsartan  1 tablet Oral BID   spironolactone  12.5 mg Oral Daily   torsemide  40 mg Oral Daily   umeclidinium bromide  1 puff Inhalation Daily   warfarin  3 mg Oral q1600   Warfarin - Pharmacist Dosing Inpatient   Does not apply q1600   Continuous Infusions:     LOS: 6 days   Time spent: 35 min  Geradine Girt, DO [facility]   11/02/2021, 1:10 PM

## 2021-11-03 ENCOUNTER — Other Ambulatory Visit (HOSPITAL_COMMUNITY): Payer: Self-pay

## 2021-11-03 DIAGNOSIS — N12 Tubulo-interstitial nephritis, not specified as acute or chronic: Secondary | ICD-10-CM | POA: Diagnosis not present

## 2021-11-03 LAB — GLUCOSE, CAPILLARY
Glucose-Capillary: 108 mg/dL — ABNORMAL HIGH (ref 70–99)
Glucose-Capillary: 113 mg/dL — ABNORMAL HIGH (ref 70–99)
Glucose-Capillary: 115 mg/dL — ABNORMAL HIGH (ref 70–99)

## 2021-11-03 LAB — PROTIME-INR
INR: 1.8 — ABNORMAL HIGH (ref 0.8–1.2)
Prothrombin Time: 20.3 seconds — ABNORMAL HIGH (ref 11.4–15.2)

## 2021-11-03 MED ORDER — CEFADROXIL 500 MG PO CAPS
1000.0000 mg | ORAL_CAPSULE | Freq: Two times a day (BID) | ORAL | 0 refills | Status: DC
  Filled 2021-11-03: qty 14, 4d supply, fill #0

## 2021-11-03 MED ORDER — CARVEDILOL 12.5 MG PO TABS: 12.5000 mg | ORAL_TABLET | Freq: Two times a day (BID) | ORAL | 0 refills | Status: DC

## 2021-11-03 MED ORDER — WARFARIN SODIUM 5 MG PO TABS
5.0000 mg | ORAL_TABLET | Freq: Once | ORAL | Status: DC
Start: 2021-11-03 — End: 2021-11-03

## 2021-11-03 NOTE — Plan of Care (Signed)
Problem: Education: Goal: Knowledge of General Education information will improve Description: Including pain rating scale, medication(s)/side effects and non-pharmacologic comfort measures 11/03/2021 1039 by Bridgette Habermann, RN Outcome: Completed/Met 11/03/2021 1039 by Bridgette Habermann, RN Outcome: Progressing   Problem: Health Behavior/Discharge Planning: Goal: Ability to manage health-related needs will improve 11/03/2021 1039 by Bridgette Habermann, RN Outcome: Completed/Met 11/03/2021 1039 by Bridgette Habermann, RN Outcome: Progressing   Problem: Clinical Measurements: Goal: Ability to maintain clinical measurements within normal limits will improve 11/03/2021 1039 by Bridgette Habermann, RN Outcome: Completed/Met 11/03/2021 1039 by Bridgette Habermann, RN Outcome: Progressing Goal: Will remain free from infection 11/03/2021 1039 by Bridgette Habermann, RN Outcome: Completed/Met 11/03/2021 1039 by Bridgette Habermann, RN Outcome: Progressing Goal: Diagnostic test results will improve 11/03/2021 1039 by Bridgette Habermann, RN Outcome: Completed/Met 11/03/2021 1039 by Bridgette Habermann, RN Outcome: Progressing Goal: Respiratory complications will improve 11/03/2021 1039 by Bridgette Habermann, RN Outcome: Completed/Met 11/03/2021 1039 by Bridgette Habermann, RN Outcome: Progressing Goal: Cardiovascular complication will be avoided 11/03/2021 1039 by Bridgette Habermann, RN Outcome: Completed/Met 11/03/2021 1039 by Bridgette Habermann, RN Outcome: Progressing   Problem: Activity: Goal: Risk for activity intolerance will decrease 11/03/2021 1039 by Bridgette Habermann, RN Outcome: Completed/Met 11/03/2021 1039 by Bridgette Habermann, RN Outcome: Progressing   Problem: Nutrition: Goal: Adequate nutrition will be maintained 11/03/2021 1039 by Bridgette Habermann, RN Outcome: Completed/Met 11/03/2021 1039 by Bridgette Habermann, RN Outcome: Progressing   Problem: Coping: Goal: Level of anxiety will decrease 11/03/2021 1039 by Bridgette Habermann, RN Outcome: Completed/Met 11/03/2021 1039 by Bridgette Habermann, RN Outcome: Progressing   Problem: Elimination: Goal: Will not experience complications related to bowel motility 11/03/2021 1039 by Bridgette Habermann, RN Outcome: Completed/Met 11/03/2021 1039 by Bridgette Habermann, RN Outcome: Progressing Goal: Will not experience complications related to urinary retention 11/03/2021 1039 by Bridgette Habermann, RN Outcome: Completed/Met 11/03/2021 1039 by Bridgette Habermann, RN Outcome: Progressing   Problem: Pain Managment: Goal: General experience of comfort will improve 11/03/2021 1039 by Bridgette Habermann, RN Outcome: Completed/Met 11/03/2021 1039 by Bridgette Habermann, RN Outcome: Progressing   Problem: Safety: Goal: Ability to remain free from injury will improve 11/03/2021 1039 by Bridgette Habermann, RN Outcome: Completed/Met 11/03/2021 1039 by Bridgette Habermann, RN Outcome: Progressing   Problem: Skin Integrity: Goal: Risk for impaired skin integrity will decrease 11/03/2021 1039 by Bridgette Habermann, RN Outcome: Completed/Met 11/03/2021 1039 by Bridgette Habermann, RN Outcome: Progressing   Problem: Education: Goal: Ability to describe self-care measures that may prevent or decrease complications (Diabetes Survival Skills Education) will improve 11/03/2021 1039 by Bridgette Habermann, RN Outcome: Completed/Met 11/03/2021 1039 by Bridgette Habermann, RN Outcome: Progressing Goal: Individualized Educational Video(s) 11/03/2021 1039 by Bridgette Habermann, RN Outcome: Completed/Met 11/03/2021 1039 by Bridgette Habermann, RN Outcome: Progressing   Problem: Coping: Goal: Ability to adjust to condition or change in health will improve 11/03/2021 1039 by Bridgette Habermann, RN Outcome: Completed/Met 11/03/2021 1039 by Bridgette Habermann, RN Outcome: Progressing   Problem: Fluid Volume: Goal: Ability to maintain a balanced intake and output will improve 11/03/2021 1039 by Bridgette Habermann, RN Outcome:  Completed/Met 11/03/2021 1039 by Bridgette Habermann, RN Outcome: Progressing   Problem: Health Behavior/Discharge Planning: Goal: Ability to identify and utilize available resources and services will improve 11/03/2021 1039 by Bridgette Habermann, RN Outcome: Completed/Met 11/03/2021 1039 by Bridgette Habermann,  RN Outcome: Progressing Goal: Ability to manage health-related needs will improve 11/03/2021 1039 by Bridgette Habermann, RN Outcome: Completed/Met 11/03/2021 1039 by Bridgette Habermann, RN Outcome: Progressing   Problem: Metabolic: Goal: Ability to maintain appropriate glucose levels will improve 11/03/2021 1039 by Bridgette Habermann, RN Outcome: Completed/Met 11/03/2021 1039 by Bridgette Habermann, RN Outcome: Progressing   Problem: Nutritional: Goal: Maintenance of adequate nutrition will improve 11/03/2021 1039 by Bridgette Habermann, RN Outcome: Completed/Met 11/03/2021 1039 by Bridgette Habermann, RN Outcome: Progressing Goal: Progress toward achieving an optimal weight will improve 11/03/2021 1039 by Bridgette Habermann, RN Outcome: Completed/Met 11/03/2021 1039 by Bridgette Habermann, RN Outcome: Progressing   Problem: Skin Integrity: Goal: Risk for impaired skin integrity will decrease 11/03/2021 1039 by Bridgette Habermann, RN Outcome: Completed/Met 11/03/2021 1039 by Bridgette Habermann, RN Outcome: Progressing   Problem: Tissue Perfusion: Goal: Adequacy of tissue perfusion will improve 11/03/2021 1039 by Bridgette Habermann, RN Outcome: Completed/Met 11/03/2021 1039 by Bridgette Habermann, RN Outcome: Progressing   Problem: Fluid Volume: Goal: Hemodynamic stability will improve 11/03/2021 1039 by Bridgette Habermann, RN Outcome: Completed/Met 11/03/2021 1039 by Bridgette Habermann, RN Outcome: Progressing   Problem: Clinical Measurements: Goal: Diagnostic test results will improve 11/03/2021 1039 by Bridgette Habermann, RN Outcome: Completed/Met 11/03/2021 1039 by Bridgette Habermann, RN Outcome: Progressing Goal: Signs and  symptoms of infection will decrease 11/03/2021 1039 by Bridgette Habermann, RN Outcome: Completed/Met 11/03/2021 1039 by Bridgette Habermann, RN Outcome: Progressing   Problem: Respiratory: Goal: Ability to maintain adequate ventilation will improve 11/03/2021 1039 by Bridgette Habermann, RN Outcome: Completed/Met 11/03/2021 1039 by Bridgette Habermann, RN Outcome: Progressing

## 2021-11-03 NOTE — Progress Notes (Signed)
Scheduled for DC today, Extensive patient teaching; however he seems acutely aware of all issues with his health. Instructed him on the importance of weighing daily as the cardiology note states he can go into CHF quickly. Stressed importance of all MD follow-up appointments and strict medication administration. He verbalizes understanding of all instructions.

## 2021-11-03 NOTE — TOC Transition Note (Signed)
Transition of Care [facility]) - CM/SW Discharge Note   Patient Details  Name: Ricky Hayden MRN: [id number] Date of Birth: [birth year]  Transition of Care [facility]) CM/SW Contact:  Bartholomew Crews, RN Phone Number: [phone] 11/03/2021, 10:23 AM   Clinical Narrative:     Spoke with patient at the bedside to discuss post acute transition. Patient stated that he was waiting on his son to arrive and take him home. Discussed recommendations for [facility]. Patient stated that he is already active with an agency for therapy and a nurse, but he could not recall the name. Found in chart review that patient was recently referred to [facility] following CIR stay. Liaison for [facility] contact to verify active status. HH orders in place per MD. No further TOC needs identified at this time.   Final next level of care: [place] Barriers to Discharge: No Barriers Identified   Patient Goals and CMS Choice Patient states their goals for this hospitalization and ongoing recovery are:: return home CMS Medicare.gov Compare Post Acute Care list provided to:: Patient Choice offered to / list presented to : Patient  Discharge Placement                       Discharge Plan and Services                DME Arranged: N/A DME Agency: NA       HH Arranged: PT, OT, RN [place] Agency: [place] Date [facility] Agency Contacted: 11/03/21 Time [place]: 6712 Representative spoke with at [place]: Amy  Social Determinants of Health ([place]) Interventions     Readmission Risk Interventions    09/08/2021    3:40 PM 08/18/2019   11:08 AM 08/17/2019    3:41 PM  Readmission Risk Prevention Plan  Transportation Screening Complete Complete Complete  PCP or Specialist Appt within 5-7 Days   Complete  PCP or Specialist Appt within 3-5 Days  Complete   Home Care Screening   Complete  Medication Review (RN CM)   Complete  HRI or Home Care Consult  Complete   Social Work Consult for [place]  Planning/Counseling  Complete   Palliative Care Screening  Complete   Medication Review Press photographer) Referral to Pharmacy Complete   PCP or Specialist appointment within 3-5 days of discharge Not Complete    PCP/Specialist Appt Not Complete comments Patient currently in ICU    [place] or [place] Not Complete    [place] or Home Care Consult Pt Refusal Comments CIR recommended at this time    SW Recovery Care/Counseling Consult Complete    [place] Not Applicable

## 2021-11-03 NOTE — Discharge Summary (Signed)
Physician Discharge Summary  Ricky Hayden EAV:[id number] DOB: 1945/08/12 DOA: 10/27/2021  PCP: [facility]  Admit date: 10/27/2021 Discharge date: 11/03/2021  Admitted From: home Discharge disposition: home   Recommendations for Outpatient Follow-Up:   Cbc, bmp 1 week Urology followup Friday Home health   Discharge Diagnosis:   Principal Problem:   Pyelonephritis Active Problems:   Edema of left lower leg   Complicated UTI (urinary tract infection)   Severe sepsis ([place])   Essential hypertension   ARF (acute renal failure) (HCC)   Pressure injury of sacral region, stage 2 (HCC)   Hyperlipidemia   GERD   BPH (benign prostatic hyperplasia)   COPD (chronic obstructive pulmonary disease) (HCC)   Chronic combined systolic and diastolic CHF, NYHA class 3 (HCC)   Stage 3b chronic kidney disease (CKD) ([place])   Type 2 diabetes mellitus with chronic kidney disease, without long-term current use of insulin (HCC)   History of craniotomy   Morbid obesity ([place])   Debility   Biventricular ICD (implantable cardioverter-defibrillator) in place   Aortic aneurysm ([place])   Bilateral renal cysts    Discharge Condition: Improved.  Diet recommendation: Low sodium, heart healthy.  Carbohydrate-modified.  Wound care: None.  Code status: Full.   History of Present Illness:   Ricky Hayden is a 76 y.o. male with medical history significant of CKD, DM2, paroxysmal a.fib, on phenobarbital from hx of head bleed, CAD,  s/p ICD/PPM, HLD, morbid obesity Renal cysts, sub renal aortic aneurysm   Presented with  lower abd pain   Recently admitted for severe sepsis secondary to UTI resulting in PEA arrest and intubation and prolonged rehab stay was home for 22 weeks and developed left flank pain dizziness nausea vomiting febrile Previous urine grew Proteus Mirabilis pansensitive to everything except nitrofurantoin Patient previous medical stay was complicated by respiratory  failure requiring BiPAP intubation secondary to hypoxemia short PEA arrest after intubation needing pressors he was able to be discharged to CIR and then on 25 May developed referring change found to be in A-fib with hypertension admitted back to ICU cardiology was consulted at the time and then cardioversion Started on amiodarone Switch to Coumadin Patient is on chronic phenobarbital had remote history of head bleed in 1990s although never had any seizures but on chronic phenobarbital for seizure prevention. Patient has been compliant his medications He has known history of CHF and has been taking his [employer] and recently his losartan was changed to spironolactone He has been taking torsemide   In May patient required a short course of steroids For COPD.  He is on 2 L of oxygen as needed     Hospital Course by Problem:   Sepsis on admission 2/2 acute pyelonephritis probably secondary to recurrent Proteus Lower pole L kidney 1 cm stone -change to PO abx to finish course -resume regular diet -Was scheduled for outpatient work-up with urologist: per Dr. Tresa Moore: state that the area in the kidneys does need addressing.  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 Recommend follow-up every 3 years.    Medical Consultants:   cards   Discharge Exam:   Vitals:   11/03/21 0755 11/03/21 0822  BP:  125/79  Pulse: 84 87  Resp: 18 16  Temp:  98.3 F (36.8 C)  SpO2: 97%    Vitals:   11/02/21 2100 11/03/21 0500 11/03/21 0755 11/03/21 0822  BP:    125/79  Pulse:   84 87  Resp:   18 16  Temp: 98.5 F (36.9 C) 98.4 F (36.9 C)  98.3 F (36.8 C)  TempSrc: Oral   Oral  SpO2:   97%   Weight:      Height:        General exam: Appears calm and comfortable.   The results of significant diagnostics from this hospitalization (including imaging, microbiology, ancillary and laboratory) are listed below for reference.     Procedures and Diagnostic Studies:   CT Abdomen Pelvis Wo Contrast  Result Date: 10/27/2021 CLINICAL DATA:  Acute abdominal pain with vomiting. EXAM: CT ABDOMEN AND PELVIS WITHOUT CONTRAST TECHNIQUE: Multidetector CT imaging of the abdomen and pelvis was performed following the standard protocol without IV contrast. RADIATION DOSE REDUCTION: This exam was performed according to the departmental dose-optimization program which includes automated exposure control, adjustment of the mA and/or kV according to patient size and/or use of iterative reconstruction technique. COMPARISON:  CT abdomen and pelvis 08/29/2021 FINDINGS: Lower chest: The heart is enlarged. There is scarring or atelectasis in the left lung base. Hepatobiliary: No focal liver abnormality is seen. No gallstones, gallbladder wall thickening, or biliary dilatation. Pancreas: Unremarkable. No pancreatic ductal dilatation or surrounding inflammatory changes. Spleen: Normal in size without focal abnormality. Adrenals/Urinary Tract: There is nonspecific bilateral perinephric fat stranding which is new from prior. There is a calculus in the lower pole the left kidney measuring up to 1 cm. No hydronephrosis. No ureteral or bladder calculi are seen. There are bilateral  renal cysts measuring up to 3.8 cm which appears similar to the prior study. Stable 12 mm rounded hyperdense lesion in the inferior pole the left kidney. The adrenal glands and bladder are within normal limits. Stomach/Bowel: Stomach is within normal limits. Appendix appears normal. No evidence of bowel wall thickening, distention, or inflammatory changes. There is sigmoid colon diverticulosis. Vascular/Lymphatic: Aortic atherosclerosis. There is a 3.2 cm infrarenal abdominal aortic aneurysm which is unchanged from prior. No enlarged abdominal or pelvic lymph nodes. Reproductive: Prostate is unremarkable. Other: No abdominal wall hernia or abnormality. No abdominopelvic ascites. There is subcutaneous edema in the medial left buttocks at the level of the sacrum. No focal fluid collection identified. Musculoskeletal: No fracture is seen. L2 vertebral body hemangioma noted. IMPRESSION: 1. Bilateral perinephric fat stranding of uncertain etiology. Please correlate clinically for infection involving the kidneys. 2. Subcutaneous stranding in the medial left buttocks may represent edema or infection. Correlate clinically. 3. Nonobstructing left renal calculus. 4. Stable hyperdense lesion in the inferior pole the left kidney measuring 12 mm. This can be further evaluated with MRI to exclude small solid lesion. 5. Colonic diverticulosis. 6. 3.2 cm infrarenal abdominal aortic aneurysm. Recommend follow-up every 3 years. Reference: J Am Coll Radiol 6440;34:742-595. Electronically Signed   By: Ronney Asters M.D.   On: 10/27/2021 15:38  DG Chest Portable 1 View  Result Date: 10/27/2021 CLINICAL DATA:  Lower abdominal pain. EXAM: PORTABLE CHEST 1 VIEW COMPARISON:  Two-view chest x-ray 10/25/2021 FINDINGS: The heart is enlarged. Pacing wires are stable. No edema or effusion is present lung volumes are low. The visualized soft tissues and bony thorax are unremarkable. IMPRESSION: 1. Stable cardiomegaly without failure. 2. Low  lung volumes. Electronically Signed   By: San Morelle M.D.   On: 10/27/2021 14:22     Labs:   Basic Metabolic Panel: Recent Labs  Lab 10/27/21 1303 10/28/21 0427 10/29/21 0337 10/30/21 0357 10/31/21 0501 11/01/21 0203 11/02/21 0101 11/02/21 1035  NA 142 140 142 141 141 140 140  --   K 3.8 4.5 4.0 4.1 4.3 4.3 3.8  --   CL 106 105 108 108 106 109 106  --   CO2 23 27 24 24 26 25 27   --   GLUCOSE 141* 86 94 121* 87 96 106*  --   BUN 25* 29* 24* 19 18 13 14   --   CREATININE 1.79* 1.83* 1.42* 1.19 1.27* 1.15 1.19  --   CALCIUM 9.2 8.8* 8.8* 9.2 9.3 9.2 9.4  --   MG 2.0  --   --   --   --   --   --  2.0  PHOS 1.6* 4.3  --   --   --   --   --   --    GFR Estimated Creatinine Clearance: 59 mL/min (by C-G formula based on SCr of 1.19 mg/dL). Liver Function Tests: Recent Labs  Lab 10/27/21 1303 10/28/21 0427  AST 17 14*  ALT 13 11  ALKPHOS 61 49  BILITOT 0.7 0.7  PROT 7.1 5.9*  ALBUMIN 3.6 3.0*   Recent Labs  Lab 10/27/21 1303  LIPASE 34   No results for input(s): "AMMONIA" in the last 168 hours. Coagulation profile Recent Labs  Lab 10/28/21 0427 10/29/21 0337 10/30/21 0357 10/31/21 0501 11/03/21 0658  INR 2.4* 2.5* 2.2* 2.2* 1.8*    CBC: Recent Labs  Lab 10/27/21 1303 10/28/21 0427 10/29/21 0337 10/30/21 0357 10/31/21 0501 11/01/21 0203 11/02/21 0101  WBC 5.9   < > 7.3 6.7 5.1 4.3 5.9  NEUTROABS 5.7  --   --   --   --   --   --   HGB 14.2   < > 11.4* 11.6* 12.1* 12.1* 12.6*  HCT 42.8   < > 36.1* 36.5* 36.9* 37.8* 38.1*  MCV 95.3   < > 98.1 98.1 96.3 96.4 93.2  PLT 160   < > 114* 119* 131* 134* 159   < > = values in this interval not displayed.   Cardiac Enzymes: Recent Labs  Lab 10/27/21 1303  CKTOTAL 39*   BNP: Invalid input(s): "POCBNP" CBG: Recent Labs  Lab 11/02/21 1617 11/02/21 2055 11/03/21 0057 11/03/21 0432 11/03/21 0826  GLUCAP 104* 135* 113* 108* 115*   D-Dimer No results for input(s): "DDIMER" in the last 72  hours. Hgb A1c No results for input(s): "HGBA1C" in the last 72 hours. Lipid Profile No results for input(s): "CHOL", "HDL", "LDLCALC", "TRIG", "CHOLHDL", "LDLDIRECT" in the last 72 hours. Thyroid function studies No results for input(s): "TSH", "T4TOTAL", "T3FREE", "THYROIDAB" in the last 72 hours.  Invalid input(s): "FREET3" Anemia work up No results for input(s): "VITAMINB12", "FOLATE", "FERRITIN", "TIBC", "IRON", "RETICCTPCT" in the last 72 hours. Microbiology Recent Results (from the past 240 hour(s))  Blood culture (routine x 2)  Status: None   Collection Time: 10/27/21  2:00 PM   Specimen: BLOOD  Result Value Ref Range Status   Specimen Description BLOOD RIGHT ANTECUBITAL  Final   Special Requests   Final    BOTTLES DRAWN AEROBIC AND ANAEROBIC Blood Culture adequate volume   Culture   Final    NO GROWTH 5 DAYS Performed at [facility], [address], [place], [place] [address]    Report Status 11/01/2021 FINAL  Final  Blood culture (routine x 2)     Status: None   Collection Time: 10/27/21  2:30 PM   Specimen: BLOOD  Result Value Ref Range Status   Specimen Description BLOOD LEFT ARM  Final   Special Requests   Final    BOTTLES DRAWN AEROBIC AND ANAEROBIC Blood Culture adequate volume   Culture   Final    NO GROWTH 5 DAYS Performed at [facility], [place] [address] [place], [place] [address]    Report Status 11/01/2021 FINAL  Final  Urine Culture     Status: Abnormal   Collection Time: 10/27/21  5:26 PM   Specimen: Urine, Clean Catch  Result Value Ref Range Status   Specimen Description URINE, CLEAN CATCH  Final   Special Requests   Final    NONE Performed at [facility], [place] [address] [place], [place] [address]    Culture MULTIPLE SPECIES PRESENT, SUGGEST RECOLLECTION (A)  Final   Report Status 10/28/2021 FINAL  Final     Discharge Instructions:   Discharge Instructions     (HEART FAILURE PATIENTS) Call MD:  Anytime you have any  of the following symptoms: 1) 3 pound weight gain in 24 hours or 5 pounds in 1 week 2) shortness of breath, with or without a dry hacking cough 3) swelling in the hands, feet or stomach 4) if you have to sleep on extra pillows at night in order to breathe.   Complete by: As directed    Diet - low sodium heart healthy   Complete by: As directed    Discharge instructions   Complete by: As directed    Can get OTC medication for cold sores   Heart Failure patients record your daily weight using the same scale at the same time of day   Complete by: As directed    Increase activity slowly   Complete by: As directed       Allergies as of 11/03/2021       Reactions   Calcium Channel Blockers Other (See Comments)   Came to hospital in 1995-caused chest pain        Medication List     STOP taking these medications    empagliflozin 10 MG Tabs tablet Commonly known as: Jardiance       TAKE these medications    acetaminophen 325 MG tablet Commonly known as: TYLENOL Take 1-2 tablets (325-650 mg total) by mouth every 4 (four) hours as needed for mild pain.   albuterol 108 (90 Base) MCG/ACT inhaler Commonly known as: VENTOLIN HFA Inhale 2 puffs into the lungs every 6 (six) hours as needed for wheezing.   amiodarone 200 MG tablet Commonly known as: PACERONE Take one pill twice a day till 06/12. Then decrease to one pill daily starting 06/13 What changed:  how much to take how to take this when to take this additional instructions   carvedilol 12.5 MG tablet Commonly known as: COREG Take 1 tablet (12.5 mg total)  by mouth 2 (two) times daily with a meal. What changed:  medication strength how much to take   cefadroxil 500 MG capsule Commonly known as: DURICEF Take 2 capsules (1,000 mg total) by mouth 2 (two) times daily.   Entresto 24-26 MG Generic drug: sacubitril-valsartan Take 1 tablet by mouth 2 (two) times daily.   finasteride 5 MG tablet Commonly known as:  PROSCAR Take 0.5 tablets (2.5 mg total) by mouth daily.   fluticasone furoate-vilanterol 200-25 MCG/ACT Aepb Commonly known as: BREO ELLIPTA Inhale 1 puff into the lungs daily.   ICAPS AREDS 2 PO Take 2 tablets by mouth daily.   melatonin 3 MG Tabs tablet Take 1 tablet (3 mg total) by mouth at bedtime.   pantoprazole 40 MG tablet Commonly known as: PROTONIX Take 1 tablet (40 mg total) by mouth daily.   PHENobarbital 64.8 MG tablet Commonly known as: LUMINAL Take 1 tablet (64.8 mg total) by mouth 2 (two) times daily.   polyethylene glycol 17 g packet Commonly known as: MIRALAX / GLYCOLAX Take 17 g by mouth 2 (two) times daily. What changed:  when to take this reasons to take this   pravastatin 40 MG tablet Commonly known as: PRAVACHOL Take 1 tablet (40 mg total) by mouth every evening.   spironolactone 25 MG tablet Commonly known as: ALDACTONE Take 0.5 tablets (12.5 mg total) by mouth daily.   torsemide 20 MG tablet Commonly known as: DEMADEX Take 2 tablets (40 mg total) by mouth daily. MAy take an additional 20 mg  or 40 mg in the afternoon if 3 lbs or 5 lbs overnight weight gain.   traMADol 50 MG tablet Commonly known as: ULTRAM Take 1 tablet (50 mg total) by mouth every 12 (twelve) hours as needed for moderate pain.   umeclidinium bromide 62.5 MCG/ACT Aepb Commonly known as: INCRUSE ELLIPTA Inhale 1 puff into the lungs daily.   Vitamin D3 25 MCG tablet Commonly known as: Vitamin D Take 1 tablet (1,000 Units total) by mouth daily.   warfarin 3 MG tablet Commonly known as: COUMADIN Take as directed. If you are unsure how to take this medication, talk to your nurse or doctor. Original instructions: Take 1 tablet (3 mg total) by mouth daily at 4 PM.        Follow-up Information     [facility] Follow up.   Contact information: [place] [address] [phone]         Deboraha Sprang, MD .    Specialty: Cardiology Contact information: [phone] [address] [place] [address] [phone]         Leonie Man, MD .   Specialty: Cardiology Contact information: [address] [place] [address] [phone]                  Time coordinating discharge: 45 min  Signed:  Geradine Girt DO  [facility] 11/03/2021, 8:37 AM

## 2021-11-03 NOTE — Progress Notes (Signed)
Ricky Hayden for Warfarin Indication: atrial fibrillation  Allergies  Allergen Reactions   Calcium Channel Blockers Other (See Comments)    Came to hospital in 1995-caused chest pain     Patient Measurements: Height: 5' 7.99" (172.7 cm) Weight: 98.1 kg (216 lb 4.3 oz) IBW/kg (Calculated) : 68.38  Vital Signs: Temp: 98.4 F (36.9 C) (07/03 0500) Temp Source: Oral (07/02 2100) Pulse Rate: 84 (07/03 0755)  Labs: Recent Labs    11/01/21 0203 11/02/21 0101 11/03/21 0658  HGB 12.1* 12.6*  --   HCT 37.8* 38.1*  --   PLT 134* 159  --   LABPROT  --   --  20.3*  INR  --   --  1.8*  CREATININE 1.15 1.19  --      Estimated Creatinine Clearance: 59 mL/min (by C-G formula based on SCr of 1.19 mg/dL).   Assessment: 76 yo M with a history of CKD, DM2, afib on warfarin. Patient presenting with abdominal pain and flank pain. Warfarin per pharmacy placed for atrial fibrillation.   PTA warfarin dose is 3mg  daily per medication history and last taken PTA 6/25.  Hgb stable 11-12s, platelets 159. No signs bleeding reported. INR subtherapeutic at 1.8. Will give slightly higher dose today  Goal of Therapy:  INR 2-3 Monitor platelets by anticoagulation protocol: Yes   Plan:  Warfarin 5mg  Po x 1 INR check MWF and tomorrow (Tues) Continue to monitor H&H and platelets    Ricky Hayden A. Ricky Hayden, PharmD, BCPS, FNKF Clinical Pharmacist Ricky Hayden Please utilize Amion for appropriate phone number to reach the unit pharmacist ([place])  Clinical phone for 11/03/2021 until 3p is [phone] 11/03/2021 8:23 AM

## 2021-11-06 ENCOUNTER — Telehealth: Payer: Self-pay | Admitting: Cardiology

## 2021-11-06 NOTE — Telephone Encounter (Signed)
Lauren from [facility] called requesting pt and INR orders be faxed over to them so the RN can check them from his home. Please advise    Fax- [phone]

## 2021-11-06 NOTE — Telephone Encounter (Signed)
Forward to Anticoagulant pool--to handle the request

## 2021-11-06 NOTE — Telephone Encounter (Signed)
I spoke to Lauren from [facility] and they will check INR on 7/10.

## 2021-11-10 ENCOUNTER — Ambulatory Visit (INDEPENDENT_AMBULATORY_CARE_PROVIDER_SITE_OTHER): Payer: No Typology Code available for payment source

## 2021-11-10 DIAGNOSIS — I480 Paroxysmal atrial fibrillation: Secondary | ICD-10-CM | POA: Diagnosis not present

## 2021-11-10 DIAGNOSIS — Z5181 Encounter for therapeutic drug level monitoring: Secondary | ICD-10-CM

## 2021-11-10 LAB — POCT INR: INR: 2 (ref 2.0–3.0)

## 2021-11-10 NOTE — Patient Instructions (Signed)
Description   Spoke with Carlyon Prows, RN with [facility]. Instructed to TAKE 1.5 TABLETS TODAY ONLY and then continue taking1 tablet daily except for 1/2 a tablet on Fridays. Recheck INR in 1 week. Anticoagulation Clinic [phone] or [phone]

## 2021-11-17 ENCOUNTER — Ambulatory Visit (INDEPENDENT_AMBULATORY_CARE_PROVIDER_SITE_OTHER): Payer: No Typology Code available for payment source | Admitting: Cardiology

## 2021-11-17 DIAGNOSIS — Z5181 Encounter for therapeutic drug level monitoring: Secondary | ICD-10-CM | POA: Diagnosis not present

## 2021-11-17 LAB — POCT INR: INR: 2.2 (ref 2.0–3.0)

## 2021-11-24 ENCOUNTER — Ambulatory Visit (INDEPENDENT_AMBULATORY_CARE_PROVIDER_SITE_OTHER): Payer: No Typology Code available for payment source | Admitting: Cardiology

## 2021-11-24 DIAGNOSIS — Z5181 Encounter for therapeutic drug level monitoring: Secondary | ICD-10-CM

## 2021-11-24 LAB — POCT INR: INR: 2.5 (ref 2.0–3.0)

## 2021-11-26 ENCOUNTER — Encounter: Payer: No Typology Code available for payment source | Admitting: Internal Medicine

## 2021-11-28 ENCOUNTER — Other Ambulatory Visit: Payer: Self-pay | Admitting: Physical Medicine and Rehabilitation

## 2021-11-28 ENCOUNTER — Other Ambulatory Visit: Payer: Self-pay | Admitting: Urology

## 2021-11-28 ENCOUNTER — Telehealth: Payer: Self-pay | Admitting: Cardiology

## 2021-11-28 ENCOUNTER — Telehealth: Payer: Self-pay

## 2021-11-28 NOTE — Telephone Encounter (Signed)
Refill request for Amiodarone 200 mg, Finasteride 5 mg, Pravastatin 40 mg and Warfarin 3 mg was cancelled due to patient back in hospital and not sure if he will stay on medications.

## 2021-11-28 NOTE — Telephone Encounter (Signed)
    Pre-operative Risk Assessment    Patient Name: Ricky Hayden  DOB: 1945-08-08 MRN: [id number]      Request for Surgical Clearance    Procedure:   left ureteroscopy with laser lithotripsy and stent placement   Date of Surgery:  Clearance 12/23/21                                 Surgeon:  Dr. Lutricia Feil Group or Practice Name:  [facility] Phone number:  [phone] Fax number:  [phone]   Type of Clearance Requested:   - Medical  - Pharmacy:  Hold Warfarin (Coumadin) 5 days prior    Type of Anesthesia:  General    Additional requests/questions:    Signed, Selinda Orion   11/28/2021, 2:43 PM

## 2021-12-01 ENCOUNTER — Other Ambulatory Visit: Payer: Self-pay | Admitting: Physical Medicine and Rehabilitation

## 2021-12-01 NOTE — Telephone Encounter (Signed)
   Name: Ricky Hayden  DOB: 1945-05-22  MRN: [id number]  Primary Cardiologist: Glenetta Hew, MD   Preoperative team, please contact this patient and set up a phone call appointment for further preoperative risk assessment. Please obtain consent and complete medication review. Thank you for your help.  Per office protocol, patient can hold warfarin for 5 days prior to procedure.   Patient will not need bridging with Lovenox (enoxaparin) around procedure.     I confirm that guidance regarding antiplatelet and oral anticoagulation therapy has been completed and, if necessary, noted below.    Mable Fill, Marissa Nestle, NP 12/01/2021, 1:39 PM [place] [address] [place], [place] [address]

## 2021-12-01 NOTE — Telephone Encounter (Signed)
Patient with diagnosis of afib on warfarin for anticoagulation.    Procedure: left ureteroscopy with laser lithotripsy and stent placement Date of procedure: 12/23/21    CHA2DS2-VASc Score = 6  This indicates a 9.7% annual risk of stroke. The patient's score is based upon: CHF History: 1 HTN History: 1 Diabetes History: 1 Stroke History: 0 Vascular Disease History: 1 Age Score: 2 Gender Score: 0   CrCl 59 mL/min using AdjBW Platelet count 159K  Per office protocol, patient can hold warfarin for 5 days prior to procedure.   Patient will not need bridging with Lovenox (enoxaparin) around procedure.

## 2021-12-01 NOTE — Telephone Encounter (Signed)
Left message to call back for IN OFFICE appt for pre op clearance, pt can keep the 02/2022 with Dr. Ellyn Hack, though needs sooner in office for preop appt.

## 2021-12-01 NOTE — Telephone Encounter (Signed)
Good morning,  Pharmacy please advise on hold time of Coumadin for upcoming left ureteroscopy with laser and stent placement.  Thank you, Ambrose Pancoast, NP

## 2021-12-02 NOTE — Telephone Encounter (Signed)
Pt has appt 9:15 12/09/21 with Coletta Memos, FNP for pre op clearance.

## 2021-12-07 NOTE — Progress Notes (Unsigned)
Cardiology Clinic Note   Patient Name: Ricky Hayden Date of Encounter: 12/09/2021  Primary Care Provider:  Clinic, [facility] Primary Cardiologist:  Glenetta Hew, MD  Patient Profile    Ricky Hayden 76 year old male presents to the clinic today for follow-up evaluation of his chronic combined systolic and diastolic CHF.  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 Sherwood Gambler   Nocturia    Obesity    Pansinusitis    a.  complicated by brain abscess and bleeding requiring craniotomy in 1995.   PONV (postoperative nausea and vomiting)    Sinusitis    s/p ethmoidectomy and nasal septoplasty   Vertigo    intermitantly   Past Surgical History:  Procedure Laterality Date   BIV UPGRADE N/A 01/29/2020   Procedure: BIV UPGRADE;  Surgeon: Evans Lance, MD;  Location: [place] [facility];  Service: Cardiovascular;  Laterality: N/A;   CARDIOVERSION N/A 09/26/2021   Procedure: CARDIOVERSION;  Surgeon: [name], MD;  Location: [facility];  Service: Cardiovascular;  Laterality: N/A;   CIRCUMCISION N/A 11/20/2013   Procedure: CIRCUMCISION ADULT;  Surgeon: Claybon Jabs, MD;  Location: [facility];  Service: Urology;  Laterality: N/A;   [place]   hematomy  due to sinus infection    PACEMAKER IMPLANT N/A 01/17/2020   Procedure: PACEMAKER IMPLANT;  Surgeon: Deboraha Sprang, MD;  Location: [facility];  Service: Cardiovascular;  Laterality: N/A;   RIGHT/LEFT HEART CATH AND CORONARY ANGIOGRAPHY N/A 09/20/2019   Procedure: RIGHT/LEFT HEART CATH AND CORONARY ANGIOGRAPHY;  Surgeon: Jolaine Artist, MD;  Location: [place] [facility];  Service: Cardiovascular;  Laterality: N/A;   shoulder surg rt   1995   SINUS SURGERY WITH INSTATRAK     ethmoidectomy and nasal septum repair   TEE WITHOUT CARDIOVERSION N/A 09/26/2021   Procedure: TRANSESOPHAGEAL ECHOCARDIOGRAM (TEE);  Surgeon: [name], MD;  Location: [facility];  Service: Cardiovascular;  Laterality: N/A;    Allergies  Allergies  Allergen Reactions   Calcium Channel Blockers Other (See Comments)    Came to hospital in 1995-caused chest pain     History of Present Illness    Ricky Hayden is a PMH of combined systolic and diastolic CHF, nonischemic cardiomyopathy, paroxysmal atrial fibrillation, essential hypertension, cardiac arrest, aortic aneurysm, hiatal hernia, COPD, GERD, type 2 diabetes, stage III CKD, hyperlipidemia, septic shock, BiV ICD, and lower extremity swelling.  He was admitted to the hospital 10/27/2021 and discharged on 11/03/2021.  He presented with abdominal pain and flank pain.  He was diagnosed with pyelonephritis at that time.  His creatinine was 1.79.  His baseline creatinine is 1.2.  He received IV antibiotics.  His CT abdomen pelvis showed bilateral perinephrotic fat stranding and was suspicious for kidney infection.  He was noted to have nonobstructing left renal calculi.  On arrival to the hospital he was noted to have atrial fibrillation/atrial flutter with RVR.  He converted back to normal sinus rhythm briefly on 6/27.  He then went back into atrial fibrillation with RVR.  His home torsemide and spironolactone were held.  His carvedilol was increased to  12.5 mg twice daily.  He converted back to normal sinus rhythm on his own on 10/30/2021.  He was noted to have recurrent urinary tract infection and his home Jardiance was discontinued.  He presents to the clinic today for follow-up evaluation and preoperative cardiac evaluation.  He states he feels fairly well.  He presents with his wife.  We reviewed his recent hospitalization and upcoming surgery.  They expressed understanding.  He is interested in transitioning to warfarin to DOAC.  I recommended that he continue with his warfarin until after his surgery and contact our office for recommendations.  We will refill his torsemide and his warfarin at this time.  We reviewed recommendations for his upcoming surgery.  We will I will have him increase his physical activity as tolerated, continue heart healthy low-sodium diet, and plan follow-up for 6 months.  Today he denies chest pain, shortness of breath, lower extremity edema, fatigue, palpitations, melena, hematuria, hemoptysis, diaphoresis, weakness, presyncope, syncope, orthopnea, and PND.     Home Medications    Prior to Admission medications   Medication Sig Start Date End Date Taking? Authorizing Provider  acetaminophen (TYLENOL) 325 MG tablet Take 1-2 tablets (325-650 mg total) by mouth every 4 (four) hours as needed for mild pain. Patient not taking: Reported on 10/27/2021 10/10/21   Love, Ivan Anchors, PA-C  albuterol (PROVENTIL HFA;VENTOLIN HFA) 108 (90 BASE) MCG/ACT inhaler Inhale 2 puffs into the lungs every 6 (six) hours as needed for wheezing. 12/10/14   Maryellen Pile, MD  amiodarone (PACERONE) 200 MG tablet TAKE ONE PILL TWICE A DAY TILL 06/12. THEN DECREASE TO ONE PILL DAILY STARTING 06/13 11/28/21   Jennye Boroughs, MD  carvedilol (COREG) 12.5 MG tablet Take 1 tablet (12.5 mg total) by mouth 2 (two) times daily with a meal. 11/03/21   Eulogio Bear U, DO  cefadroxil (DURICEF) 500 MG capsule Take 2 capsules (1,000 mg total) by mouth 2 (two)  times daily. 11/03/21   Geradine Girt, DO  finasteride (PROSCAR) 5 MG tablet TAKE 1/2 TABLET BY MOUTH DAILY 11/28/21   Jennye Boroughs, MD  fluticasone furoate-vilanterol (BREO ELLIPTA) 200-25 MCG/ACT AEPB Inhale 1 puff into the lungs daily. 10/10/21   Love, Ivan Anchors, PA-C  melatonin 3 MG TABS tablet Take 1 tablet (3 mg total) by mouth at bedtime. 10/10/21   Love, Ivan Anchors, PA-C  Multiple Vitamins-Minerals (ICAPS AREDS 2 PO) Take 2 tablets by mouth daily.    [provider]  pantoprazole (PROTONIX) 40 MG tablet Take 1 tablet (40 mg total) by mouth daily. 10/10/21   Love, Ivan Anchors, PA-C  PHENobarbital (LUMINAL) 64.8 MG tablet Take 1 tablet (64.8 mg total) by mouth 2 (two) times daily. 07/24/15   Corky Sox, MD  polyethylene glycol (MIRALAX / GLYCOLAX) 17 g packet Take 17 g by mouth 2 (two) times daily. Patient taking differently: Take 17 g by mouth daily as needed for mild constipation. 10/10/21   Bary Leriche, PA-C  pravastatin (  PRAVACHOL) 40 MG tablet TAKE 1 TABLET BY MOUTH EVERY DAY IN THE EVENING 11/28/21   Jennye Boroughs, MD  sacubitril-valsartan (ENTRESTO) 24-26 MG Take 1 tablet by mouth 2 (two) times daily. 10/10/21   Love, Ivan Anchors, PA-C  spironolactone (ALDACTONE) 25 MG tablet Take 0.5 tablets (12.5 mg total) by mouth daily. 10/20/21   Leonie Man, MD  torsemide (DEMADEX) 20 MG tablet Take 2 tablets (40 mg total) by mouth daily. MAy take an additional 20 mg  or 40 mg in the afternoon if 3 lbs or 5 lbs overnight weight gain. 10/20/21   Leonie Man, MD  traMADol (ULTRAM) 50 MG tablet Take 1 tablet (50 mg total) by mouth every 12 (twelve) hours as needed for moderate pain. 10/10/21   Love, Ivan Anchors, PA-C  umeclidinium bromide (INCRUSE ELLIPTA) 62.5 MCG/ACT AEPB Inhale 1 puff into the lungs daily. 10/10/21   Love, Ivan Anchors, PA-C  Vitamin D3 (VITAMIN D) 25 MCG tablet Take 1 tablet (1,000 Units total) by mouth daily. 10/10/21   Love, Ivan Anchors, PA-C  warfarin (COUMADIN) 3 MG tablet TAKE 1  TABLET (3 MG TOTAL) BY MOUTH DAILY AT 4 PM. 11/28/21   Jennye Boroughs, MD    Family History    Family History  Problem Relation Age of Onset   Heart failure Mother 54   Heart attack Father 88   He indicated that his mother is deceased. He indicated that his father is deceased. He indicated that his maternal grandmother is deceased. He indicated that his maternal grandfather is deceased. He indicated that his paternal grandmother is deceased. He indicated that his paternal grandfather is deceased.  Social History    Social History   Socioeconomic History   Marital status: Widowed    Spouse name: Not on file   Number of children: Not on file   Years of education: Not on file   Highest education level: Not on file  Occupational History   Not on file  Tobacco Use   Smoking status: Former    Types: Cigarettes    Quit date: 05/04/1986    Years since quitting: 35.6   Smokeless tobacco: Never  Vaping Use   Vaping Use: Never used  Substance and Sexual Activity   Alcohol use: No    Alcohol/week: 0.0 standard drinks of alcohol   Drug use: No   Sexual activity: Yes  Other Topics Concern   Not on file  Social History Narrative   Not on file   Social Determinants of Health   Financial Resource Strain: Not on file  Food Insecurity: Not on file  Transportation Needs: Not on file  Physical Activity: Not on file  Stress: Not on file  Social Connections: Not on file  Intimate Partner Violence: Not on file     Review of Systems    General:  No chills, fever, night sweats or weight changes.  Cardiovascular:  No chest pain, dyspnea on exertion, edema, orthopnea, palpitations, paroxysmal nocturnal dyspnea. Dermatological: No rash, lesions/masses Respiratory: No cough, dyspnea Urologic: No hematuria, dysuria Abdominal:   No nausea, vomiting, diarrhea, bright red blood per rectum, melena, or hematemesis Neurologic:  No visual changes, wkns, changes in mental status. All other  systems reviewed and are otherwise negative except as noted above.  Physical Exam    VS:  BP 103/80   Pulse 85   Ht 5\' 7"  (1.702 m)   Wt 216 lb 12.8 oz (98.3 kg)   SpO2 97%   BMI  33.96 kg/m  , BMI Body mass index is 33.96 kg/m. GEN: Well nourished, well developed, in no acute distress. HEENT: normal. Neck: Supple, no JVD, carotid bruits, or masses. Cardiac: RRR, no murmurs, rubs, or gallops. No clubbing, cyanosis, edema.  Radials/DP/PT 2+ and equal bilaterally.  Respiratory:  Respirations regular and unlabored, bilateral expiratory wheeze throughout all lung fields. GI: Soft, nontender, nondistended, BS + x 4. MS: no deformity or atrophy. Skin: warm and dry, no rash. Neuro:  Strength and sensation are intact. Psych: Normal affect.  Accessory Clinical Findings    Recent Labs: 09/10/2021: B Natriuretic Peptide 27.6 10/27/2021: TSH 3.345 10/28/2021: ALT 11 11/02/2021: BUN 14; Creatinine, Ser 1.19; Hemoglobin 12.6; Magnesium 2.0; Platelets 159; Potassium 3.8; Sodium 140   Recent Lipid Panel    Component Value Date/Time   CHOL 134 08/15/2019 0256   TRIG 110 08/15/2019 0256   TRIG 80 05/22/2005 0000   HDL 35 (L) 08/15/2019 0256   CHOLHDL 3.8 08/15/2019 0256   VLDL 22 08/15/2019 0256   LDLCALC 77 08/15/2019 0256    ECG personally reviewed by me today-sinus rhythm with first-degree AV block with premature supraventricular complexes and consecutive premature ventricular complexes and fusion complexes 85 bpm  Echocardiogram TEE 09/26/2021  IMPRESSIONS     1. Left ventricular ejection fraction, by estimation, is 30 to 35%. The  left ventricle has moderately decreased function. The left ventricle  demonstrates global hypokinesis.   2. Right ventricular systolic function is normal. The right ventricular  size is normal.   3. No left atrial/left atrial appendage thrombus was detected.   4. The mitral valve is normal in structure. Trivial mitral valve  regurgitation. No evidence  of mitral stenosis.   5. The aortic valve is tricuspid. Aortic valve regurgitation is trivial.  No aortic stenosis is present.   6. The inferior vena cava is normal in size with greater than 50%  respiratory variability, suggesting right atrial pressure of 3 mmHg.   Conclusion(s)/Recommendation(s): Normal biventricular function without  evidence of hemodynamically significant valvular heart disease.   Assessment & Plan   1.  Combined systolic and diastolic CHF, nonischemic cardiomyopathy-no increased DOE or activity intolerance.  NYHA class II-3.  Status post BiV ICD.  Weight stable. Continue Entresto, spironolactone, torsemide, amiodarone, carvedilol Heart healthy low-sodium diet-salty 6 given Increase physical activity as tolerated Daily weights-contact office with a weight increase of 2-3 pounds overnight or 5 pounds in 1 week. Elevate lower extremities when not active Rfill torsemide, torsemide    Essential hypertension-BP today 103/80.  Well-controlled at home. Continue Entresto, spironolactone, carvedilol Heart healthy low-sodium diet-salty 6 given Increase physical activity as tolerated   Paroxysmal atrial fibrillation-denies recent episodes of irregular or accelerated heartbeat.  Reports compliance with Coumadin.  Denies bleeding issues. Continue Coumadin, carvedilol Heart healthy low-sodium diet-salty 6 given Increase physical activity as tolerated Avoid triggers caffeine, chocolate, EtOH, dehydration etc.  Preoperative cardiac evaluation-left ureteroscopy with laser lithotripsy and stent placement, 12/23/2021, Dr.Machen    Primary Cardiologist: Glenetta Hew, MD  Chart reviewed as part of pre-operative protocol coverage. Given past medical history and time since last visit, based on ACC/AHA guidelines, Kasten Leveque would be at acceptable risk for the planned procedure without further cardiovascular testing.   Patient was advised that if he develops new symptoms  prior to surgery to contact our office to arrange a follow-up appointment.  He verbalized understanding.  Patient with diagnosis of afib on warfarin for anticoagulation.     Procedure: left ureteroscopy with laser  lithotripsy and stent placement Date of procedure: 12/23/21      His RCRI is a class II risk, 0.9% risk of major cardiac event.    CHA2DS2-VASc Score = 6  This indicates a 9.7% annual risk of stroke. The patient's score is based upon: CHF History: 1 HTN History: 1 Diabetes History: 1 Stroke History: 0 Vascular Disease History: 1 Age Score: 2 Gender Score: 0   CrCl 59 mL/min using AdjBW Platelet count 159K   Per office protocol, patient can hold warfarin for 5 days prior to procedure.   Patient will not need bridging with Lovenox (enoxaparin) around procedure.  Disposition: Follow-up with Dr. Ellyn Hack in 6 months.   Jossie Ng. Mabelle Mungin NP-C     12/09/2021, 9:50 AM [place] [address] [address] Office [phone] Fax [phone]  Notice: This dictation was prepared with Dragon dictation along with smaller phrase technology. Any transcriptional errors that result from this process are unintentional and may not be corrected upon review.  I spent 13 minutes examining this patient, reviewing medications, and using patient centered shared decision making involving her cardiac care.  Prior to her visit I spent greater than 20 minutes reviewing her past medical history,  medications, and prior cardiac tests.

## 2021-12-08 ENCOUNTER — Ambulatory Visit (INDEPENDENT_AMBULATORY_CARE_PROVIDER_SITE_OTHER): Payer: No Typology Code available for payment source | Admitting: *Deleted

## 2021-12-08 DIAGNOSIS — Z5181 Encounter for therapeutic drug level monitoring: Secondary | ICD-10-CM

## 2021-12-08 LAB — POCT INR: INR: 1.6 — AB (ref 2.0–3.0)

## 2021-12-08 NOTE — Patient Instructions (Signed)
Description   Spoke with Sandi, RN with [facility] and instructed to take 1.5 tablets today the continue taking 1 tablet daily except for 1/2 tablet on Fridays. Recheck INR in 2 weeks. Anticoagulation Clinic [phone] or [phone]

## 2021-12-09 ENCOUNTER — Encounter: Payer: Self-pay | Admitting: General Practice

## 2021-12-09 ENCOUNTER — Ambulatory Visit (INDEPENDENT_AMBULATORY_CARE_PROVIDER_SITE_OTHER): Payer: No Typology Code available for payment source | Admitting: General Practice

## 2021-12-09 VITALS — BP 103/80 | HR 85 | Ht 67.0 in | Wt 216.8 lb

## 2021-12-09 DIAGNOSIS — I480 Paroxysmal atrial fibrillation: Secondary | ICD-10-CM

## 2021-12-09 DIAGNOSIS — I5042 Chronic combined systolic (congestive) and diastolic (congestive) heart failure: Secondary | ICD-10-CM

## 2021-12-09 DIAGNOSIS — I10 Essential (primary) hypertension: Secondary | ICD-10-CM

## 2021-12-09 DIAGNOSIS — Z01810 Encounter for preprocedural cardiovascular examination: Secondary | ICD-10-CM | POA: Diagnosis not present

## 2021-12-09 MED ORDER — WARFARIN SODIUM 3 MG PO TABS: 3.0000 mg | ORAL_TABLET | Freq: Every day | ORAL | 1 refills | Status: DC

## 2021-12-09 MED ORDER — TORSEMIDE 20 MG PO TABS
40.0000 mg | ORAL_TABLET | Freq: Every day | ORAL | 1 refills | Status: DC
Start: 2021-12-09 — End: 2022-02-05

## 2021-12-09 NOTE — Progress Notes (Addendum)
Anesthesia Review:  [facility]: [facility]  DR Domenica Fail - LOV 12/05/21  Cardiologist :  DR Glenetta Hew  LOV 10/20/21 Coletta Memos, NP LOv 12/09/21  DR Pierre Bali  Pacemaker Last device check- 05/15/21  BiV upgrade- 2021  Device ordres requested on 12/12/21.  On chart.  And in epic.  Roderic Palau - LOV 05/15/21   DR Caryl Comes  Chest x-ray : 11/02/21- 1 view  EKG : 10/28/21  and 12/12/21  Vasc- 10/25/21  Echo : 09/26/21 and 08/29/21  Stress test: Cardiac Cath :  2021  Activity level: cannot do a flight of stairs without difficulty  Sleep Study/ CPAP : none  Fasting Blood Sugar :      / Checks Blood Sugar -- times a day:   Blood Thinner/ Instructions /Last Dose: ASA / Instructions/ Last Dose :   Coumadin - stop 5 days prior per pt  Uses oxygen 2L at nite - prn  Cardioversoin - 09/26/21  Has [employer] for [place] comes 3 days per week Family stays at nite with father,  Children take turns.   Just completed PT on 12/11/21 per pt.   AT preop dinamapp read pulse between 28-142.  Radial pulse was irregular.  PT voices no complaints.  EKG done x 2 .  Pricilla Riffle made aware.  Aware of E:KG results.  No new orders given.  PT discharged with son.   Nurse to call son at 4pm  to clarify when pt takes meds.   BMP done 12/12/21 routed to DR Terrilee Files.  PT able to answer questions regarding medical hx .  REviewed preop instrucitons with pt and son at preop appt.   At time of preop appt pt's son, Nhat Hearne, who was at preop was unsure of when pt took some of meds.  Informed son that nurse would call him at home this pm  and determine what times of day pt took meds so preop nurse could instruct as to what meds to take am of surgery.  Called son at 1730pm and reviewed meds with son via phone.  Son had list in front of him per son.  Instructed son to follow  preop instrucitons sheet as to what meds to take am of surgery and they are: inhalers as usual and bring, amiodarone, coreg, proscar, protonix,  luminal ,  Pt's son voiced understanding.  And stated he would highlight on preop instructions URine culture done 12/12/2021 rotued to Dr Cain Sieve on 12/15/21.

## 2021-12-09 NOTE — Patient Instructions (Signed)
SURGICAL WAITING ROOM VISITATION Patients having surgery or a procedure may have no more than 2 support people in the waiting area - these visitors may rotate.   Children under the age of 75 must have an adult with them who is not the patient. If the patient needs to stay at the hospital during part of their recovery, the visitor guidelines for inpatient rooms apply. Pre-op nurse will coordinate an appropriate time for 1 support person to accompany patient in pre-op.  This support person may not rotate.    Please refer to the [facility] website for the visitor guidelines for Inpatients (after your surgery is over and you are in a regular room).       Your procedure is scheduled on:  12/23/21    Report to [facility] Main Entrance    Report to admitting at  [place] AM   Call this number if you have problems the morning of surgery [phone]   Do not eat food :After Midnight.   After Midnight you may have the following liquids until ___ 0545 am DAY OF SURGERY  Water Non-Citrus Juices (without pulp, NO RED) Carbonated Beverages Black Coffee (NO MILK/CREAM OR CREAMERS, sugar ok)  Clear Tea (NO MILK/CREAM OR CREAMERS, sugar ok) regular and decaf                             Plain Jell-O (NO RED)                                           Fruit ices (not with fruit pulp, NO RED)                                     Popsicles (NO RED)                                                               Sports drinks like Gatorade (NO RED)                        Oral Hygiene is also important to reduce your risk of infection.                                    Remember - BRUSH YOUR TEETH THE MORNING OF SURGERY WITH YOUR REGULAR TOOTHPASTE   Do NOT smoke after Midnight   Take these medicines the morning of surgery with A SIP OF WATER:  inhalers as usual and brign, pacerone, proscar, protonix, luminal, coreg   DO NOT TAKE ANY ORAL DIABETIC MEDICATIONS DAY OF YOUR SURGERY  Bring  CPAP mask and tubing day of surgery.                              You may not have any metal on your body including hair pins, jewelry, and body piercing  Do not wear make-up, lotions, powders, perfumes/cologne, or deodorant  Do not wear nail polish including gel and S&S, artificial/acrylic nails, or any other type of covering on natural nails including finger and toenails. If you have artificial nails, gel coating, etc. that needs to be removed by a nail salon please have this removed prior to surgery or surgery may need to be canceled/ delayed if the surgeon/ anesthesia feels like they are unable to be safely monitored.   Do not shave  48 hours prior to surgery.               Men may shave face and neck.   Do not bring valuables to the hospital. [place].   Contacts, dentures or bridgework may not be worn into surgery.   Bring small overnight bag day of surgery.   DO NOT [place]. PHARMACY WILL DISPENSE MEDICATIONS LISTED ON YOUR MEDICATION LIST TO YOU DURING YOUR ADMISSION [place]!    Patients discharged on the day of surgery will not be allowed to drive home.  Someone NEEDS to stay with you for the first 24 hours after anesthesia.   Special Instructions: Bring a copy of your healthcare power of attorney and living will documents         the day of surgery if you haven't scanned them before.              Please read over the following fact sheets you were given: IF YOU HAVE QUESTIONS ABOUT YOUR PRE-OP INSTRUCTIONS PLEASE CALL [phone]     [facility] - Preparing for Surgery Before surgery, you can play an important role.  Because skin is not sterile, your skin needs to be as free of germs as possible.  You can reduce the number of germs on your skin by washing with CHG (chlorahexidine gluconate) soap before surgery.  CHG is an antiseptic cleaner which kills germs and bonds with  the skin to continue killing germs even after washing. Please DO NOT use if you have an allergy to CHG or antibacterial soaps.  If your skin becomes reddened/irritated stop using the CHG and inform your nurse when you arrive at Short Stay. Do not shave (including legs and underarms) for at least 48 hours prior to the first CHG shower.  You may shave your face/neck. Please follow these instructions carefully:  1.  Shower with CHG Soap the night before surgery and the  morning of Surgery.  2.  If you choose to wash your hair, wash your hair first as usual with your  normal  shampoo.  3.  After you shampoo, rinse your hair and body thoroughly to remove the  shampoo.                           4.  Use CHG as you would any other liquid soap.  You can apply chg directly  to the skin and wash                       Gently with a scrungie or clean washcloth.  5.  Apply the CHG Soap to your body ONLY FROM THE NECK DOWN.   Do not use on face/ open  Wound or open sores. Avoid contact with eyes, ears mouth and genitals (private parts).                       Wash face,  Genitals (private parts) with your normal soap.             6.  Wash thoroughly, paying special attention to the area where your surgery  will be performed.  7.  Thoroughly rinse your body with warm water from the neck down.  8.  DO NOT shower/wash with your normal soap after using and rinsing off  the CHG Soap.                9.  Pat yourself dry with a clean towel.            10.  Wear clean pajamas.            11.  Place clean sheets on your bed the night of your first shower and do not  sleep with pets. Day of Surgery : Do not apply any lotions/deodorants the morning of surgery.  Please wear clean clothes to the hospital/surgery center.  FAILURE TO FOLLOW THESE INSTRUCTIONS MAY RESULT IN THE CANCELLATION OF YOUR SURGERY PATIENT SIGNATURE_________________________________  NURSE  SIGNATURE__________________________________  ________________________________________________________________________

## 2021-12-09 NOTE — Patient Instructions (Signed)
Medication Instructions:  The current medical regimen is effective;  continue present plan and medications as directed. Please refer to the Current Medication list given to you today.   *If you need a refill on your cardiac medications before your next appointment, please call your pharmacy*  Lab Work:   Testing/Procedures:  NONE    NONE  If you have labs (blood work) drawn today and your tests are completely normal, you will receive your results only by: [place] (if you have MyChart) OR  A paper copy in the mail If you have any lab test that is abnormal or we need to change your treatment, we will call you to review the results.  Special Instructions PLEASE READ AND FOLLOW SALTY 6-ATTACHED-1,800mg  daily  CALL TO CHANGE YOUR WARFARIN  Follow-Up: Your next appointment:  6 month(s) In Person with Glenetta Hew, MD    At [facility], you and your health needs are our priority.  As part of our continuing mission to provide you with exceptional heart care, we have created designated Provider Care Teams.  These Care Teams include your primary Cardiologist (physician) and Advanced Practice Providers (APPs -  Physician Assistants and Nurse Practitioners) who all work together to provide you with the care you need, when you need it. Important Information About Sugar             6 SALTY THINGS TO AVOID     1,800MG  DAILY

## 2021-12-12 ENCOUNTER — Encounter: Payer: Self-pay | Admitting: Internal Medicine

## 2021-12-12 ENCOUNTER — Other Ambulatory Visit: Payer: Self-pay

## 2021-12-12 ENCOUNTER — Encounter (HOSPITAL_COMMUNITY)
Admission: RE | Admit: 2021-12-12 | Discharge: 2021-12-12 | Disposition: A | Discharge status: Home / Self Care | Payer: No Typology Code available for payment source | Source: Ambulatory Visit | Attending: Urology | Admitting: Urology

## 2021-12-12 ENCOUNTER — Encounter (HOSPITAL_COMMUNITY): Payer: Self-pay

## 2021-12-12 VITALS — BP 106/68 | Temp 98.2°F | Resp 16 | Ht 67.0 in | Wt 216.8 lb

## 2021-12-12 DIAGNOSIS — K219 Gastro-esophageal reflux disease without esophagitis: Secondary | ICD-10-CM | POA: Insufficient documentation

## 2021-12-12 DIAGNOSIS — Z87440 Personal history of urinary (tract) infections: Secondary | ICD-10-CM | POA: Insufficient documentation

## 2021-12-12 DIAGNOSIS — Z01818 Encounter for other preprocedural examination: Secondary | ICD-10-CM | POA: Insufficient documentation

## 2021-12-12 DIAGNOSIS — I2510 Atherosclerotic heart disease of native coronary artery without angina pectoris: Secondary | ICD-10-CM | POA: Insufficient documentation

## 2021-12-12 DIAGNOSIS — Z87891 Personal history of nicotine dependence: Secondary | ICD-10-CM | POA: Insufficient documentation

## 2021-12-12 DIAGNOSIS — I130 Hypertensive heart and chronic kidney disease with heart failure and stage 1 through stage 4 chronic kidney disease, or unspecified chronic kidney disease: Secondary | ICD-10-CM | POA: Insufficient documentation

## 2021-12-12 DIAGNOSIS — N200 Calculus of kidney: Secondary | ICD-10-CM | POA: Insufficient documentation

## 2021-12-12 DIAGNOSIS — Z950 Presence of cardiac pacemaker: Secondary | ICD-10-CM | POA: Insufficient documentation

## 2021-12-12 DIAGNOSIS — N390 Urinary tract infection, site not specified: Secondary | ICD-10-CM | POA: Insufficient documentation

## 2021-12-12 HISTORY — DX: Dyspnea, unspecified: R06.00

## 2021-12-12 HISTORY — DX: Personal history of urinary calculi: Z87.442

## 2021-12-12 HISTORY — DX: Presence of cardiac pacemaker: Z95.0

## 2021-12-12 LAB — CBC
HCT: 39.5 % (ref 39.0–52.0)
Hemoglobin: 13.2 g/dL (ref 13.0–17.0)
MCH: 31.2 pg (ref 26.0–34.0)
MCHC: 33.4 g/dL (ref 30.0–36.0)
MCV: 93.4 fL (ref 80.0–100.0)
Platelets: 148 10*3/uL — ABNORMAL LOW (ref 150–400)
RBC: 4.23 MIL/uL (ref 4.22–5.81)
RDW: 12.1 % (ref 11.5–15.5)
WBC: 5.2 10*3/uL (ref 4.0–10.5)
nRBC: 0 % (ref 0.0–0.2)

## 2021-12-12 LAB — BASIC METABOLIC PANEL
Anion gap: 4 — ABNORMAL LOW (ref 5–15)
BUN: 38 mg/dL — ABNORMAL HIGH (ref 8–23)
CO2: 30 mmol/L (ref 22–32)
Calcium: 9.6 mg/dL (ref 8.9–10.3)
Chloride: 107 mmol/L (ref 98–111)
Creatinine, Ser: 1.56 mg/dL — ABNORMAL HIGH (ref 0.61–1.24)
GFR, Estimated: 45 mL/min — ABNORMAL LOW (ref >60)
Glucose, Bld: 109 mg/dL — ABNORMAL HIGH (ref 70–99)
Potassium: 4.7 mmol/L (ref 3.5–5.1)
Sodium: 141 mmol/L (ref 135–145)

## 2021-12-12 NOTE — Progress Notes (Signed)
PERIOPERATIVE PRESCRIPTION FOR IMPLANTED CARDIAC DEVICE PROGRAMMING  Patient Information: Name:  Ricky Hayden  DOB:  08/19/1945  MRN:  [id number]    Planned Procedure:   left ureteroscopy Holmium Laser, lithotripsy , stent  Surgeon:  Dr  Terrilee Files  Date of Procedure:   12/23/21  Cautery will be used.  Position during surgery:   Please send documentation back to:  [facility] [phone] # [phone])  [facility]  Device Information:  Clinic EP Physician:  Virl Axe, MD   Device Type:  Pacemaker Manufacturer and Phone #:  [employer]: [phone] Pacemaker Dependent?:  No. Date of Last Device Check:  05/15/2021 Normal Device Function?:  Yes.    Electrophysiologist's Recommendations:  Have magnet available. Provide continuous ECG monitoring when magnet is used or reprogramming is to be performed.  Procedure should not interfere with device function.  No device programming or magnet placement needed.  Per Device Clinic Standing Orders, Wanda Plump, RN  2:36 PM 12/12/2021

## 2021-12-13 LAB — URINE CULTURE: Culture: <10000 — AB

## 2021-12-15 NOTE — Progress Notes (Signed)
Anesthesia Chart Review   Case: [id number] Date/Time: 12/23/21 0840   Procedure: LEFT URETEROSCOPY/HOLMIUM LASER LITHOTRIPSY/STENT PLACEMENT (Left) - 90 MINUTES NEEDED FOR CASE   Anesthesia type: General   Pre-op diagnosis: LEFT RENAL STONE, RECURRENT UTI   Location: [facility] / [facility]   Surgeons: Vira Agar, MD       DISCUSSION:76 y.o.f ormer smoker with h/o PONV, HTN, GERD, PAF (on Coumadin), CAD, CHF (EF 30-35%), pacemaker in place (device orders in 12/12/2021 progress note, procedure should not interfere), CKD Stage III, left renal stone, recurrent UTI scheduled for above procedure 12/23/2021 with Dr. Terrilee Files.   Pt last seen by cardiology 12/09/2021. Per OV note, "Chart reviewed as part of pre-operative protocol coverage. Given past medical history and time since last visit, based on ACC/AHA guidelines, Ricky Hayden would be at acceptable risk for the planned procedure without further cardiovascular testing."  Pt advised to hold Coumadin 5 days prior to procedure, no Lovenox bridging needed.  Anticipate pt can proceed with planned procedure barring acute status change.   VS: BP 106/68   Temp 36.8 C (Oral)   Resp 16   Ht 5\' 7"  (1.702 m)   Wt 98.3 kg   SpO2 99%   BMI 33.96 kg/m   PROVIDERS: Clinic, [facility] is PCP   Primary Cardiologist: Glenetta Hew, MD LABS: Labs reviewed: Acceptable for surgery. (all labs ordered are listed, but only abnormal results are displayed)  Labs Reviewed  URINE CULTURE - Abnormal; Notable for the following components:      Result Value   Culture   (*)    Value: <10,000 COLONIES/mL INSIGNIFICANT GROWTH Performed at [facility], [address], [place], [place] [address]    All other components within normal limits  BASIC METABOLIC PANEL - Abnormal; Notable for the following components:   Glucose, Bld 109 (*)    BUN 38 (*)    Creatinine, Ser 1.56 (*)    GFR, Estimated 45 (*)    Anion gap 4 (*)    All other  components within normal limits  CBC - Abnormal; Notable for the following components:   Platelets 148 (*)    All other components within normal limits     IMAGES:   EKG:   CV: Echo 09/26/2021 1. Left ventricular ejection fraction, by estimation, is 30 to 35%. The  left ventricle has moderately decreased function. The left ventricle  demonstrates global hypokinesis.   2. Right ventricular systolic function is normal. The right ventricular  size is normal.   3. No left atrial/left atrial appendage thrombus was detected.   4. The mitral valve is normal in structure. Trivial mitral valve  regurgitation. No evidence of mitral stenosis.   5. The aortic valve is tricuspid. Aortic valve regurgitation is trivial.  No aortic stenosis is present.   6. The inferior vena cava is normal in size with greater than 50%  respiratory variability, suggesting right atrial pressure of 3 mmHg.   Cardiac Cath 09/20/2019 Assessment:   1. Minimal CAD 2. Nonischemic CM 3. 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 Sherwood Gambler   Nocturia    Obesity    Pansinusitis    a.  complicated by brain abscess  and bleeding requiring craniotomy in 1995.   PONV (postoperative nausea and vomiting)    Presence of permanent cardiac pacemaker    Sinusitis    s/p ethmoidectomy and nasal septoplasty   Vertigo    intermitantly    Past Surgical History:  Procedure Laterality Date   BIV UPGRADE N/A 01/29/2020   Procedure: BIV UPGRADE;  Surgeon: Evans Lance, MD;  Location: [place] [facility];  Service: Cardiovascular;  Laterality: N/A;   CARDIOVERSION N/A 09/26/2021   Procedure: CARDIOVERSION;  Surgeon: [name], MD;  Location: [place];  Service: Cardiovascular;  Laterality: N/A;   CIRCUMCISION N/A 11/20/2013   Procedure: CIRCUMCISION ADULT;  Surgeon: Claybon Jabs, MD;  [facility];  Service: Urology;  Laterality: N/A;   [place]   hematomy due to sinus infection    PACEMAKER IMPLANT N/A 01/17/2020   Procedure: PACEMAKER IMPLANT;  Surgeon: Deboraha Sprang, MD;  Location: [place] [facility];  Service: Cardiovascular;  Laterality: N/A;   RIGHT/LEFT HEART CATH AND CORONARY ANGIOGRAPHY N/A 09/20/2019   Procedure: RIGHT/LEFT HEART CATH AND CORONARY ANGIOGRAPHY;  Surgeon: Jolaine Artist, MD;  Location: [place] [facility];  Service: Cardiovascular;  Laterality: N/A;   shoulder surg rt   1995   SINUS SURGERY WITH INSTATRAK     ethmoidectomy and nasal septum repair   TEE WITHOUT CARDIOVERSION N/A 09/26/2021   Procedure: TRANSESOPHAGEAL ECHOCARDIOGRAM (TEE);  Surgeon: [name], MD;  Location: [facility];  Service: Cardiovascular;  Laterality: N/A;    MEDICATIONS:  acetaminophen (TYLENOL) 500 MG tablet   albuterol (PROVENTIL HFA;VENTOLIN HFA) 108 (90 BASE) MCG/ACT inhaler   amiodarone (PACERONE) 200 MG tablet   carvedilol (COREG) 12.5 MG tablet   cefadroxil (DURICEF) 500 MG capsule   cephALEXin (KEFLEX) 500 MG capsule   finasteride (PROSCAR) 5 MG tablet   fluticasone furoate-vilanterol (BREO ELLIPTA) 200-25 MCG/ACT AEPB   fluticasone-salmeterol (WIXELA INHUB)  250-50 MCG/ACT AEPB   Melatonin 10 MG CAPS   melatonin 3 MG TABS tablet   Multiple Vitamins-Minerals (ICAPS AREDS 2 PO)   pantoprazole (PROTONIX) 40 MG tablet   PHENobarbital (LUMINAL) 64.8 MG tablet   polyethylene glycol (MIRALAX / GLYCOLAX) 17 g packet   pravastatin (PRAVACHOL) 40 MG tablet   sacubitril-valsartan (ENTRESTO) 24-26 MG   spironolactone (ALDACTONE) 25 MG tablet   Tiotropium Bromide Monohydrate (SPIRIVA RESPIMAT) 2.5 MCG/ACT AERS   torsemide (DEMADEX) 20 MG tablet   traMADol (ULTRAM) 50 MG tablet   umeclidinium bromide (INCRUSE ELLIPTA) 62.5 MCG/ACT AEPB   Vitamin D3 (VITAMIN D) 25 MCG tablet   warfarin (COUMADIN) 3 MG tablet   No current facility-administered medications for this encounter.    Konrad Felix, PA-C [facility] [phone]

## 2021-12-15 NOTE — Anesthesia Preprocedure Evaluation (Addendum)
Anesthesia Evaluation  Patient identified by MRN, date of birth, ID band Patient awake    Reviewed: Allergy & Precautions, NPO status , Patient's Chart, lab work & pertinent test results  History of Anesthesia Complications (+) PONV and history of anesthetic complications  Airway Mallampati: II  TM Distance: >3 FB Neck ROM: Full    Dental no notable dental hx.    Pulmonary shortness of breath, COPD, former smoker,    Pulmonary exam normal breath sounds clear to auscultation       Cardiovascular hypertension, Pt. on medications + CAD and +CHF  Normal cardiovascular exam+ pacemaker  Rhythm:Regular Rate:Normal     Neuro/Psych Seizures -,  negative psych ROS   GI/Hepatic Neg liver ROS, GERD  ,  Endo/Other  diabetes, Type 2  Renal/GU Renal InsufficiencyRenal disease  negative genitourinary   Musculoskeletal  (+) Arthritis , Osteoarthritis,    Abdominal (+) + obese,   Peds negative pediatric ROS (+)  Hematology negative hematology ROS (+)   Anesthesia Other Findings   Reproductive/Obstetrics negative OB ROS                            Anesthesia Physical Anesthesia Plan  ASA: 3  Anesthesia Plan: General   Post-op Pain Management: Dilaudid IV   Induction: Intravenous  PONV Risk Score and Plan: 3 and Ondansetron, Dexamethasone, Midazolam and Treatment may vary due to age or medical condition  Airway Management Planned: LMA  Additional Equipment:   Intra-op Plan:   Post-operative Plan: Extubation in OR  Informed Consent: I have reviewed the patients History and Physical, chart, labs and discussed the procedure including the risks, benefits and alternatives for the proposed anesthesia with the patient or authorized representative who has indicated his/her understanding and acceptance.     Dental advisory given  Plan Discussed with: CRNA  Anesthesia Plan Comments: (See PAT note  12/12/2021)       Anesthesia Quick Evaluation

## 2021-12-16 ENCOUNTER — Telehealth: Payer: Self-pay | Admitting: *Deleted

## 2021-12-16 NOTE — Telephone Encounter (Signed)
Sandy with [facility] called and stated that the pt is having a procedure that requires him holding his warfarin and the date to check INR is when he is supposed to have INR checked. Advised that I did not see a clearance but later found that the pt can hold warfarin 5 days without bridging. Left ureteroscopy with laser lithotripsy and stent placemen procedure on 12/23/2021; last dose is tomorrow and then he will hold warfarin then resume after procedure per MD instructions. Advised to follow the instructions given then resume warfarin as instructed and recheck INR in 1 week from resuming.

## 2021-12-23 ENCOUNTER — Ambulatory Visit (HOSPITAL_COMMUNITY)
Admission: RE | Admit: 2021-12-23 | Discharge: 2021-12-23 | Disposition: A | Discharge status: Home / Self Care | Payer: No Typology Code available for payment source | Source: Ambulatory Visit | Attending: Urology | Admitting: Urology

## 2021-12-23 ENCOUNTER — Encounter (HOSPITAL_COMMUNITY): Payer: Self-pay | Admitting: Urology

## 2021-12-23 ENCOUNTER — Ambulatory Visit (HOSPITAL_COMMUNITY): Payer: No Typology Code available for payment source

## 2021-12-23 ENCOUNTER — Ambulatory Visit (HOSPITAL_BASED_OUTPATIENT_CLINIC_OR_DEPARTMENT_OTHER): Payer: No Typology Code available for payment source | Admitting: Certified Registered"

## 2021-12-23 ENCOUNTER — Other Ambulatory Visit: Payer: Self-pay

## 2021-12-23 ENCOUNTER — Encounter (HOSPITAL_COMMUNITY)
Admission: RE | Disposition: A | Discharge status: Home / Self Care | Payer: Self-pay | Source: Ambulatory Visit | Attending: Urology

## 2021-12-23 ENCOUNTER — Ambulatory Visit (HOSPITAL_COMMUNITY): Payer: No Typology Code available for payment source | Admitting: Physician Assistant

## 2021-12-23 DIAGNOSIS — Z950 Presence of cardiac pacemaker: Secondary | ICD-10-CM | POA: Insufficient documentation

## 2021-12-23 DIAGNOSIS — I110 Hypertensive heart disease with heart failure: Secondary | ICD-10-CM

## 2021-12-23 DIAGNOSIS — Z6833 Body mass index (BMI) 33.0-33.9, adult: Secondary | ICD-10-CM | POA: Insufficient documentation

## 2021-12-23 DIAGNOSIS — Z87891 Personal history of nicotine dependence: Secondary | ICD-10-CM | POA: Insufficient documentation

## 2021-12-23 DIAGNOSIS — E119 Type 2 diabetes mellitus without complications: Secondary | ICD-10-CM | POA: Diagnosis not present

## 2021-12-23 DIAGNOSIS — Z79899 Other long term (current) drug therapy: Secondary | ICD-10-CM | POA: Diagnosis not present

## 2021-12-23 DIAGNOSIS — N390 Urinary tract infection, site not specified: Secondary | ICD-10-CM | POA: Insufficient documentation

## 2021-12-23 DIAGNOSIS — E669 Obesity, unspecified: Secondary | ICD-10-CM | POA: Insufficient documentation

## 2021-12-23 DIAGNOSIS — M1990 Unspecified osteoarthritis, unspecified site: Secondary | ICD-10-CM | POA: Diagnosis not present

## 2021-12-23 DIAGNOSIS — K219 Gastro-esophageal reflux disease without esophagitis: Secondary | ICD-10-CM | POA: Insufficient documentation

## 2021-12-23 DIAGNOSIS — J449 Chronic obstructive pulmonary disease, unspecified: Secondary | ICD-10-CM | POA: Diagnosis not present

## 2021-12-23 DIAGNOSIS — I2510 Atherosclerotic heart disease of native coronary artery without angina pectoris: Secondary | ICD-10-CM

## 2021-12-23 DIAGNOSIS — I5022 Chronic systolic (congestive) heart failure: Secondary | ICD-10-CM | POA: Insufficient documentation

## 2021-12-23 DIAGNOSIS — I130 Hypertensive heart and chronic kidney disease with heart failure and stage 1 through stage 4 chronic kidney disease, or unspecified chronic kidney disease: Secondary | ICD-10-CM | POA: Insufficient documentation

## 2021-12-23 DIAGNOSIS — N183 Chronic kidney disease, stage 3 unspecified: Secondary | ICD-10-CM | POA: Insufficient documentation

## 2021-12-23 DIAGNOSIS — Z01818 Encounter for other preprocedural examination: Secondary | ICD-10-CM

## 2021-12-23 DIAGNOSIS — N200 Calculus of kidney: Secondary | ICD-10-CM | POA: Diagnosis present

## 2021-12-23 HISTORY — PX: CYSTOSCOPY/URETEROSCOPY/HOLMIUM LASER/STENT PLACEMENT: SHX6546

## 2021-12-23 LAB — PROTIME-INR
INR: 1.1 (ref 0.8–1.2)
Prothrombin Time: 13.8 seconds (ref 11.4–15.2)

## 2021-12-23 LAB — GLUCOSE, CAPILLARY: Glucose-Capillary: 106 mg/dL — ABNORMAL HIGH (ref 70–99)

## 2021-12-23 SURGERY — CYSTOSCOPY/URETEROSCOPY/HOLMIUM LASER/STENT PLACEMENT
Anesthesia: General | Site: Ureter | Laterality: Left

## 2021-12-23 MED ORDER — SODIUM CHLORIDE 0.9 % IR SOLN
Status: DC | PRN
  Administered 2021-12-23: 3000 mL

## 2021-12-23 MED ORDER — ONDANSETRON HCL 4 MG/2ML IJ SOLN
INTRAMUSCULAR | Status: AC
  Filled 2021-12-23: qty 2

## 2021-12-23 MED ORDER — PROMETHAZINE HCL 25 MG/ML IJ SOLN: 6.2500 mg | INTRAMUSCULAR | Status: DC | PRN

## 2021-12-23 MED ORDER — AMISULPRIDE (ANTIEMETIC) 5 MG/2ML IV SOLN: 10.0000 mg | Freq: Once | INTRAVENOUS | Status: DC | PRN

## 2021-12-23 MED ORDER — LIDOCAINE 2% (20 MG/ML) 5 ML SYRINGE
INTRAMUSCULAR | Status: AC
  Filled 2021-12-23: qty 5

## 2021-12-23 MED ORDER — DEXAMETHASONE SODIUM PHOSPHATE 10 MG/ML IJ SOLN
INTRAMUSCULAR | Status: AC
  Filled 2021-12-23: qty 1

## 2021-12-23 MED ORDER — CHLORHEXIDINE GLUCONATE 0.12 % MT SOLN
15.0000 mL | Freq: Once | OROMUCOSAL | Status: AC
  Administered 2021-12-23: 15 mL via OROMUCOSAL

## 2021-12-23 MED ORDER — CELECOXIB 200 MG PO CAPS: 200.0000 mg | ORAL_CAPSULE | Freq: Two times a day (BID) | ORAL | 0 refills | Status: AC

## 2021-12-23 MED ORDER — ORAL CARE MOUTH RINSE: 15.0000 mL | Freq: Once | OROMUCOSAL | Status: AC

## 2021-12-23 MED ORDER — FENTANYL CITRATE (PF) 100 MCG/2ML IJ SOLN
INTRAMUSCULAR | Status: DC | PRN
Start: 2021-12-23 — End: 2021-12-23
  Administered 2021-12-23: 50 ug via INTRAVENOUS
  Administered 2021-12-23 (×2): 25 ug via INTRAVENOUS

## 2021-12-23 MED ORDER — OXYCODONE HCL 5 MG PO TABS
ORAL_TABLET | ORAL | Status: AC
  Filled 2021-12-23: qty 1

## 2021-12-23 MED ORDER — ONDANSETRON HCL 4 MG/2ML IJ SOLN
INTRAMUSCULAR | Status: DC | PRN
  Administered 2021-12-23: 4 mg via INTRAVENOUS

## 2021-12-23 MED ORDER — PROPOFOL 10 MG/ML IV BOLUS
INTRAVENOUS | Status: AC
  Filled 2021-12-23: qty 20

## 2021-12-23 MED ORDER — 0.9 % SODIUM CHLORIDE (POUR BTL) OPTIME
TOPICAL | Status: DC | PRN
  Administered 2021-12-23: 1000 mL

## 2021-12-23 MED ORDER — FENTANYL CITRATE (PF) 100 MCG/2ML IJ SOLN
INTRAMUSCULAR | Status: AC
  Filled 2021-12-23: qty 2

## 2021-12-23 MED ORDER — LACTATED RINGERS IV SOLN: INTRAVENOUS | Status: DC

## 2021-12-23 MED ORDER — OXYCODONE HCL 5 MG/5ML PO SOLN: 5.0000 mg | Freq: Once | ORAL | Status: AC | PRN

## 2021-12-23 MED ORDER — OXYCODONE HCL 5 MG PO TABS
5.0000 mg | ORAL_TABLET | Freq: Once | ORAL | Status: AC | PRN
  Administered 2021-12-23: 5 mg via ORAL

## 2021-12-23 MED ORDER — LIDOCAINE 2% (20 MG/ML) 5 ML SYRINGE
INTRAMUSCULAR | Status: DC | PRN
  Administered 2021-12-23: 80 mg via INTRAVENOUS

## 2021-12-23 MED ORDER — DEXAMETHASONE SODIUM PHOSPHATE 10 MG/ML IJ SOLN
INTRAMUSCULAR | Status: DC | PRN
  Administered 2021-12-23: 10 mg via INTRAVENOUS

## 2021-12-23 MED ORDER — CEFDINIR 300 MG PO CAPS: 300.0000 mg | ORAL_CAPSULE | Freq: Two times a day (BID) | ORAL | 0 refills | Status: DC

## 2021-12-23 MED ORDER — EPHEDRINE 5 MG/ML INJ
INTRAVENOUS | Status: AC
  Filled 2021-12-23: qty 5

## 2021-12-23 MED ORDER — HYDROMORPHONE HCL 1 MG/ML IJ SOLN: 0.2500 mg | INTRAMUSCULAR | Status: DC | PRN

## 2021-12-23 MED ORDER — CIPROFLOXACIN IN D5W 400 MG/200ML IV SOLN
400.0000 mg | INTRAVENOUS | Status: AC
  Administered 2021-12-23: 400 mg via INTRAVENOUS
  Filled 2021-12-23: qty 200

## 2021-12-23 MED ORDER — OXYCODONE HCL 5 MG PO TABS: 5.0000 mg | ORAL_TABLET | Freq: Four times a day (QID) | ORAL | 0 refills | Status: AC | PRN

## 2021-12-23 MED ORDER — TAMSULOSIN HCL 0.4 MG PO CAPS: 0.4000 mg | ORAL_CAPSULE | Freq: Every day | ORAL | 0 refills | Status: DC

## 2021-12-23 MED ORDER — IOHEXOL 300 MG/ML  SOLN
INTRAMUSCULAR | Status: DC | PRN
  Administered 2021-12-23: 7 mg

## 2021-12-23 MED ORDER — PROPOFOL 10 MG/ML IV BOLUS
INTRAVENOUS | Status: DC | PRN
  Administered 2021-12-23: 150 mg via INTRAVENOUS

## 2021-12-23 MED ORDER — EPHEDRINE SULFATE-NACL 50-0.9 MG/10ML-% IV SOSY
PREFILLED_SYRINGE | INTRAVENOUS | Status: DC | PRN
  Administered 2021-12-23 (×2): 5 mg via INTRAVENOUS

## 2021-12-23 SURGICAL SUPPLY — 29 items
BAG COUNTER SPONGE SURGICOUNT (BAG) IMPLANT
BAG SPNG CNTER NS LX DISP (BAG)
BAG URO CATCHER STRL LF (MISCELLANEOUS) ×1 IMPLANT
BASKET ZERO TIP NITINOL 2.4FR (BASKET) IMPLANT
BSKT STON RTRVL ZERO TP 2.4FR (BASKET) ×1
CATH URETERAL DUAL LUMEN 10F (MISCELLANEOUS) ×1 IMPLANT
CATH URETL OPEN END 6FR 70 (CATHETERS) IMPLANT
CLOTH BEACON ORANGE TIMEOUT ST (SAFETY) ×1 IMPLANT
GLOVE SS BIOGEL STRL SZ 7 (GLOVE) ×1 IMPLANT
GLOVE SUPERSENSE BIOGEL SZ 7 (GLOVE) ×1
GLOVE SURG LX 7.5 STRW (GLOVE) ×1
GLOVE SURG LX STRL 7.5 STRW (GLOVE) ×1 IMPLANT
GOWN STRL REUS W/ TWL XL LVL3 (GOWN DISPOSABLE) ×1 IMPLANT
GOWN STRL REUS W/TWL XL LVL3 (GOWN DISPOSABLE) ×1
GUIDEWIRE STR DUAL SENSOR (WIRE) ×1 IMPLANT
GUIDEWIRE ZIPWRE .038 STRAIGHT (WIRE) ×1 IMPLANT
IV NS 1000ML (IV SOLUTION) ×1
IV NS 1000ML BAXH (IV SOLUTION) ×1 IMPLANT
KIT TURNOVER KIT A (KITS) IMPLANT
LASER FIB FLEXIVA PULSE ID 365 (Laser) IMPLANT
MANIFOLD NEPTUNE II (INSTRUMENTS) ×1 IMPLANT
PACK CYSTO (CUSTOM PROCEDURE TRAY) ×1 IMPLANT
SHEATH NAV HD 11/13X46 (SHEATH) IMPLANT
SHEATH NAVIGATOR HD 11/13X36 (SHEATH) IMPLANT
STENT URET 6FRX26 CONTOUR (STENTS) IMPLANT
TRACTIP FLEXIVA PULS ID 200XHI (Laser) IMPLANT
TRACTIP FLEXIVA PULSE ID 200 (Laser) ×1
TUBING CONNECTING 10 (TUBING) ×1 IMPLANT
TUBING UROLOGY SET (TUBING) ×1 IMPLANT

## 2021-12-23 NOTE — H&P (Signed)
H&P  History of Present Illness: Ricky Hayden is a 76 y.o. year old who presents today for left ureteroscopy with laser lithotripsy. No acute complaints today.   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 Sherwood Gambler   Nocturia    Obesity    Pansinusitis    a.  complicated by brain abscess and bleeding requiring craniotomy in 1995.   PONV (postoperative nausea and vomiting)    Presence of permanent cardiac pacemaker    Sinusitis    s/p ethmoidectomy and nasal septoplasty   Vertigo    intermitantly    Past Surgical History:  Procedure Laterality Date   BIV UPGRADE N/A 01/29/2020   Procedure: BIV UPGRADE;  Surgeon: Evans Lance, MD;  Location: [place] [facility];  Service: Cardiovascular;  Laterality: N/A;   CARDIOVERSION N/A 09/26/2021   Procedure: CARDIOVERSION;  Surgeon: [name], MD;  Location: [place];  Service: Cardiovascular;  Laterality: N/A;   CIRCUMCISION N/A 11/20/2013   Procedure: CIRCUMCISION ADULT;  Surgeon: Claybon Jabs, MD;  [facility];  Service: Urology;  Laterality: N/A;   [place]   hematomy due to sinus infection    PACEMAKER IMPLANT N/A 01/17/2020   Procedure:  PACEMAKER IMPLANT;  Surgeon: Deboraha Sprang, MD;  Location: [place] [facility];  Service: Cardiovascular;  Laterality: N/A;   RIGHT/LEFT HEART CATH AND CORONARY ANGIOGRAPHY N/A 09/20/2019   Procedure: RIGHT/LEFT HEART CATH AND CORONARY ANGIOGRAPHY;  Surgeon: Jolaine Artist, MD;  Location: [place] [facility];  Service: Cardiovascular;  Laterality: N/A;   shoulder surg rt   1995   SINUS SURGERY WITH INSTATRAK     ethmoidectomy and nasal septum repair   TEE WITHOUT CARDIOVERSION N/A 09/26/2021   Procedure: TRANSESOPHAGEAL ECHOCARDIOGRAM (TEE);  Surgeon: [name], MD;  [facility];  Service: Cardiovascular;  Laterality: N/A;    Home Medications:  Current Meds  Medication Sig   acetaminophen (TYLENOL) 500 MG tablet Take 1,000 mg by mouth every 6 (six) hours as needed for moderate pain.   albuterol (PROVENTIL HFA;VENTOLIN HFA) 108 (90 BASE) MCG/ACT inhaler Inhale 2 puffs into the lungs every 6 (six) hours as needed for wheezing.   amiodarone (PACERONE) 200 MG tablet TAKE ONE PILL TWICE A DAY TILL 06/12. THEN DECREASE TO ONE PILL DAILY STARTING 06/13 (Patient taking differently: Take 200 mg by mouth daily.)   carvedilol (COREG) 12.5 MG tablet Take 1 tablet (12.5 mg total) by mouth 2 (two) times daily with a meal. (Patient taking differently: Take 6.25 mg by mouth  2 (two) times daily with a meal.)   cephALEXin (KEFLEX) 500 MG capsule Take 500 mg by mouth daily.   finasteride (PROSCAR) 5 MG tablet TAKE 1/2 TABLET BY MOUTH DAILY   fluticasone-salmeterol (WIXELA INHUB) 250-50 MCG/ACT AEPB Inhale 2 puffs into the lungs in the morning and at bedtime.   Melatonin 10 MG CAPS Take 10 mg by mouth at bedtime.   Multiple Vitamins-Minerals (ICAPS AREDS 2 PO) Take 1 tablet by mouth daily.   pantoprazole (PROTONIX) 40 MG tablet Take 1 tablet (40 mg total) by mouth daily.   PHENobarbital (LUMINAL) 64.8 MG tablet Take 1 tablet (64.8 mg total) by mouth 2 (two) times daily.   polyethylene  glycol (MIRALAX / GLYCOLAX) 17 g packet Take 17 g by mouth 2 (two) times daily. (Patient taking differently: Take 17 g by mouth daily as needed for moderate constipation.)   pravastatin (PRAVACHOL) 40 MG tablet TAKE 1 TABLET BY MOUTH EVERY DAY IN THE EVENING   sacubitril-valsartan (ENTRESTO) 24-26 MG Take 1 tablet by mouth 2 (two) times daily.   spironolactone (ALDACTONE) 25 MG tablet Take 0.5 tablets (12.5 mg total) by mouth daily.   Tiotropium Bromide Monohydrate (SPIRIVA RESPIMAT) 2.5 MCG/ACT AERS Inhale 2 each into the lungs 2 (two) times daily.   torsemide (DEMADEX) 20 MG tablet Take 2 tablets (40 mg total) by mouth daily. MAy take an additional 20 mg  or 40 mg in the afternoon if 3 lbs or 5 lbs overnight weight gain. (Patient taking differently: Take 20 mg by mouth 2 (two) times daily.)   Vitamin D3 (VITAMIN D) 25 MCG tablet Take 1 tablet (1,000 Units total) by mouth daily.   warfarin (COUMADIN) 3 MG tablet Take 1 tablet (3 mg total) by mouth daily at 4 PM.    Allergies:  Allergies  Allergen Reactions   Calcium Channel Blockers Other (See Comments)    Came to hospital in 1995-caused chest pain    Jardiance [Empagliflozin]     reacted with phenobarbital    Family History  Problem Relation Age of Onset   Heart failure Mother 24   Heart attack Father 34    Social History:  reports that he quit smoking about 35 years ago. His smoking use included cigarettes. He has never used smokeless tobacco. He reports that he does not drink alcohol and does not use drugs.  ROS: A complete review of systems was performed.  All systems are negative except for pertinent findings as noted.  Physical Exam:  Vital signs in last 24 hours: Temp:  [98.8 F (37.1 C)] 98.8 F (37.1 C) (08/22 0714) Pulse Rate:  [60] 60 (08/22 0714) Resp:  [16] 16 (08/22 0714) BP: (117)/(77) 117/77 (08/22 0714) SpO2:  [98 %] 98 % (08/22 0714) Weight:  [98 kg] 98 kg (08/22 0807) Constitutional:  Alert and oriented,  No acute distress Cardiovascular: Regular rate and rhythm, No JVD Respiratory: Normal respiratory effort, Lungs clear bilaterally GI: Abdomen is soft, nontender, nondistended, no abdominal masses GU: No CVA tenderness Lymphatic: No lymphadenopathy Neurologic: Grossly intact, no focal deficits Psychiatric: Normal mood and affect Drains/Tubes: none   Laboratory Data:  No results for input(s): "WBC", "HGB", "HCT", "PLT" in the last 72 hours.  No results for input(s): "NA", "K", "CL", "GLUCOSE", "BUN", "CALCIUM", "CREATININE" in the last 72 hours.  Invalid input(s): "CO3"   No results found for this or any previous visit (from the past 24 hour(s)). No results found for this or any previous visit (from the past  240 hour(s)).  Renal Function: No results for input(s): "CREATININE" in the last 168 hours. Estimated Creatinine Clearance: 44.3 mL/min (A) (by C-G formula based on SCr of 1.56 mg/dL (H)).  Radiologic Imaging: No results found.  Assessment:  76 yo M with L renal stone, hx of recurrent UTI  Plan:  To OR for ureteroscopy with laser litho, stent placement. We discussed risks, including persistence of infections, and he voiced understanding  Donald Pore, MD 12/23/2021, 8:30 AM  [facility] Pager: [phone]

## 2021-12-23 NOTE — Anesthesia Procedure Notes (Signed)
Procedure Name: LMA Insertion Date/Time: 12/23/2021 9:07 AM  Performed by: Jena Tegeler D, CRNAPre-anesthesia Checklist: Patient identified, Emergency Drugs available, Suction available and Patient being monitored Patient Re-evaluated:Patient Re-evaluated prior to induction Oxygen Delivery Method: Circle system utilized Preoxygenation: Pre-oxygenation with 100% oxygen Induction Type: IV induction Ventilation: Mask ventilation without difficulty LMA: LMA inserted LMA Size: 4.0 Tube type: Oral Number of attempts: 1 Placement Confirmation: positive ETCO2 and breath sounds checked- equal and bilateral Tube secured with: Tape Dental Injury: Teeth and Oropharynx as per pre-operative assessment

## 2021-12-23 NOTE — Anesthesia Postprocedure Evaluation (Signed)
Anesthesia Post Note  Patient: Ricky Hayden  Procedure(s) Performed: LEFT URETEROSCOPY/HOLMIUM LASER LITHOTRIPSY/STENT PLACEMENT retrograde (Left: Ureter)     Patient location during evaluation: PACU Anesthesia Type: General Level of consciousness: awake and alert Pain management: pain level controlled Vital Signs Assessment: post-procedure vital signs reviewed and stable Respiratory status: spontaneous breathing, nonlabored ventilation and respiratory function stable Cardiovascular status: blood pressure returned to baseline and stable Postop Assessment: no apparent nausea or vomiting Anesthetic complications: no   No notable events documented.  Last Vitals:  Vitals:   12/23/21 1145 12/23/21 1213  BP: 131/73 (!) 141/76  Pulse: 66 62  Resp: 14 16  Temp:    SpO2: 94% 94%    Last Pain:  Vitals:   12/23/21 1145  TempSrc:   PainSc: 0-No pain                 Lynda Rainwater

## 2021-12-23 NOTE — Op Note (Signed)
Operative Note  Preoperative diagnosis:  1.  Left renal stone 2. Recurrent UTI  Postoperative diagnosis: same  Procedure(s): 1.  Left ureteroscopy with laser lithotripsy and basket extraction of stones 2. Cystoscopy  3. Left retrograde pyelogram 4. Left ureteral stent placement 6x26 on string 5. Fluoroscopy with intraoperative interpretation  Surgeon: Donald Pore, MD  Assistants:  None  Anesthesia:  General  Complications:  None  EBL:  Minimal  Specimens: 1. stones for stone analysis (to be done at [facility]) 2. culture  Drains/Catheters: 1.  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  Indication:  Ricky Hayden is a 76 y.o. male with a left renal stone and recurrent UTI. Options were discussed and he elected to proceed with the above intervention.   Description of procedure: After informed consent was obtained from the patient, the patient was identified and taken to the operating room and placed in the supine position.  General anesthesia was administered as well as perioperative IV antibiotics.  At the beginning of the case, a time-out was performed to properly identify the patient, the surgery to be performed, and the surgical site.  Sequential compression devices were applied to the lower extremities at the beginning of the case for DVT prophylaxis.  The patient was then placed in the dorsal lithotomy supine position, prepped and draped in sterile fashion.  Preliminary scout fluoroscopy revealed that there was a calcification area at the left kidney, which corresponds to the left stone found on the preoperative CT scan. We then passed the 21-French rigid cystoscope through the urethra and into the bladder under vision without any  difficulty, noting a normal urethra without strictures and a mildly obstructing prostate.  A systematic evaluation of the bladder revealed no evidence of any suspicious bladder lesions.  Ureteral orifices were in normal position.    We then passed a 0.038 glide wire up to the level of the renal pelvis.   The cystoscope was withdrawn, and a dual lumen catheter was inserted over the glide wire into the distal ureter. A gentle retrograde pyelogram was performed, revealing a normal caliber ureter without any filling defects. There was no hydronephrosis of the collecting system. A 0.038 sensor wire was then passed up to the level of the renal pelvis and secured to the drape as a safety wire. The dual lumen was removed.  An 11/13Fr ureteral access sheath was carefully advanced up the ureter to the level of the UPJ over this wire under fluoroscopic guidance. The flexible ureteroscope was advanced into the collecting system via the access sheath. The collecting system was inspected. The calculus was identified in a mid polar calyx. Using the 242 micron holmium laser fiber, the stone was fragmented completely. A 2.2 Fr zero tip basket was used to remove the fragments under visual guidance. These were sent for chemical analysis. With the ureteroscope in the kidney, a gentle pyelogram was performed to delineate the calyceal system and we evaluated the calyces systematically. The rest of the stone fragments were very tiny and these were  irrigated away gently. The calyces were re-inspected and there were no significant stone fragment residual.   We then withdrew the ureteroscope back down the ureter along with the access sheath, noting no evidence of any stones along the course of the ureter.  Prior to removing the  ureteroscope.  Once the ureteroscope was removed, we then used the Glidewire under fluoroscopic guidance and passed up a 6-French x 26 cm double-pigtail ureteral stent up the ureter, making sure that the  proximal and distal ends coiled within the kidney and bladder respectively.  Note that we left a tether string attached to the distal end of the ureteral stent and it exited the urethral meatus and was secured to the penile shaft.  The cystoscope was then advanced back into the bladder under vision.  We were able to see the distal stent coiling nicely within the bladder.  The bladder was then emptied with irrigation solution.  The cystoscope was then removed.    The patient tolerated the procedure well and there was no complication. Patient was awoken from anesthesia and taken to the recovery room in stable condition. I was present and scrubbed for the entirety of the case.  Plan:  Patient will be discharged home and can remove the stent by pulling on the string in 2 days   G. Donald Pore MD [facility]  Pager: [phone]

## 2021-12-23 NOTE — Transfer of Care (Signed)
Immediate Anesthesia Transfer of Care Note  Patient: Ricky Hayden  Procedure(s) Performed: LEFT URETEROSCOPY/HOLMIUM LASER LITHOTRIPSY/STENT PLACEMENT retrograde (Left: Ureter)  Patient Location: PACU  Anesthesia Type:General  Level of Consciousness: awake, alert  and oriented  Airway & Oxygen Therapy: Patient Spontanous Breathing and Patient connected to face mask oxygen  Post-op Assessment: Report given to RN and Post -op Vital signs reviewed and stable  Post vital signs: Reviewed and stable  Last Vitals:  Vitals Value Taken Time  BP 131/76 12/23/21 1045  Temp    Pulse 69 12/23/21 1046  Resp 23 12/23/21 1046  SpO2 99 % 12/23/21 1046  Vitals shown include unvalidated device data.  Last Pain:  Vitals:   12/23/21 0807  TempSrc:   PainSc: 5          Complications: No notable events documented.

## 2021-12-23 NOTE — Discharge Instructions (Addendum)
[facility] [phone] Post Ureteroscopy With or Without Stent Instructions *remove stent by pulling on string Thursday morning*  Definitions:  Ureter: The duct that transports urine from the kidney to the bladder. Stent:   A plastic hollow tube that is placed into the ureter, from the kidney to the bladder to prevent the ureter from swelling shut.  GENERAL INSTRUCTIONS:  Despite the fact that no skin incisions were used, the area around the ureter and bladder is raw and irritated. The stent is a foreign body which will further irritate the bladder wall. This irritation is manifested by increased frequency of urination, both day and night, and by an increase in the urge to urinate. In some, the urge to urinate is present almost always. Sometimes the urge is strong enough that you may not be able to stop yourself from urinating. The only real cure is to remove the stent and then give time for the bladder wall to heal which can't be done until the danger of the ureter swelling shut has passed, which varies.  You may see some blood in your urine while the stent is in place and a few days afterwards. Do not be alarmed, even if the urine was clear for a while. Get off your feet and drink lots of fluids until clearing occurs. If you start to pass clots or don't improve, call us.  DIET: You may return to your normal diet immediately. Because of the raw surface of your bladder, alcohol, spicy foods, acid type foods and drinks with caffeine may cause irritation or frequency and should be used in moderation. To keep your urine flowing freely and to avoid constipation, drink plenty of fluids during the day ( 8-10 glasses ). Tip: Avoid cranberry juice because it is very acidic.  ACTIVITY: Your physical activity doesn't need to be restricted. However, if you are very active, you may see some blood in your urine. We suggest that you reduce your activity under these circumstances until the  bleeding has stopped.  BOWELS: It is important to keep your bowels regular during the postoperative period. Straining with bowel movements can cause bleeding. A bowel movement every other day is reasonable. Use a mild laxative if needed, such as Milk of Magnesia 2-3 tablespoons, or 2 Dulcolax tablets. Call if you continue to have problems. If you have been taking narcotics for pain, before, during or after your surgery, you may be constipated. Take a laxative if necessary.   MEDICATION: You should resume your pre-surgery medications unless told not to. In addition you will often be given an antibiotic to prevent infection and likely several as needed medications for stent related discomfort. These should be taken as prescribed until the bottles are finished unless you are having an unusual reaction to one of the drugs.  PROBLEMS YOU SHOULD REPORT TO [place]: Fevers over 100.5 Fahrenheit. Heavy bleeding, or clots ( See above notes about blood in urine ). Inability to urinate. Drug reactions ( hives, rash, nausea, vomiting, diarrhea ). Severe burning or pain with urination that is not improving.

## 2021-12-24 ENCOUNTER — Encounter (HOSPITAL_COMMUNITY): Payer: Self-pay | Admitting: Urology

## 2021-12-24 ENCOUNTER — Telehealth: Payer: Self-pay | Admitting: Cardiology

## 2021-12-24 LAB — URINE CULTURE: Culture: NO GROWTH

## 2021-12-24 NOTE — Telephone Encounter (Signed)
Returned call and spoke with pt's niece, Larene Beach. Larene Beach stated pt did not experience any issue or bleeding complications post procedure; however, she wanted to confirm it was okay for pt to resume his Warfarin. I educated her on how the Coumadin Clinic typically advises to restart Warfarin post procedure unless instructed differently by surgeon. Larene Beach stated pt will restart his Warfarin and Home Health will come back to recheck INR on Monday.   I called Sandi with [facility] and confirmed INR can be checked at Home visit on Monday 12/29/21.

## 2021-12-24 NOTE — Telephone Encounter (Signed)
Pt c/o medication issue:  1. Name of Medication:   warfarin (COUMADIN) 3 MG tablet    2. How are you currently taking this medication (dosage and times per day)? Take 1 tablet (3 mg total) by mouth daily at 4 PM.  3. Are you having a reaction (difficulty breathing--STAT)? No  4. What is your medication issue? Pt's niece needing to know when he is to start back taking medication. She states that he had to stop taking due to recent surgery. Please advise

## 2021-12-29 ENCOUNTER — Ambulatory Visit (INDEPENDENT_AMBULATORY_CARE_PROVIDER_SITE_OTHER): Payer: No Typology Code available for payment source | Admitting: Cardiology

## 2021-12-29 DIAGNOSIS — Z5181 Encounter for therapeutic drug level monitoring: Secondary | ICD-10-CM

## 2021-12-29 LAB — POCT INR: INR: 1.1 — AB (ref 2.0–3.0)

## 2022-01-07 ENCOUNTER — Telehealth: Payer: Self-pay | Admitting: Cardiology

## 2022-01-07 ENCOUNTER — Ambulatory Visit (INDEPENDENT_AMBULATORY_CARE_PROVIDER_SITE_OTHER): Payer: No Typology Code available for payment source | Admitting: *Deleted

## 2022-01-07 DIAGNOSIS — Z5181 Encounter for therapeutic drug level monitoring: Secondary | ICD-10-CM

## 2022-01-07 LAB — POCT INR: INR: 1.5 — AB (ref 2.0–3.0)

## 2022-01-07 NOTE — Telephone Encounter (Signed)
Kelly from [facility] calling to give INR result of 1.5. Phone: [phone]

## 2022-01-07 NOTE — Telephone Encounter (Signed)
Refer to anticoagulation encounter

## 2022-01-07 NOTE — Telephone Encounter (Signed)
[place] x 2 regarding results. Started an Anticoagulation Encounter to address results and called Home Health Nurse; there was no voicemail so will call back.

## 2022-01-07 NOTE — Patient Instructions (Signed)
Description   Spoke with Claiborne Billings, RN with [facility] and instructed for pt to take 1.5 tablets today and then START taking 1 tablet daily.  Recheck INR in 1 week.  Anticoagulation Clinic [phone]

## 2022-01-08 ENCOUNTER — Encounter: Payer: Self-pay | Admitting: Internal Medicine

## 2022-01-08 ENCOUNTER — Ambulatory Visit: Payer: No Typology Code available for payment source | Attending: Internal Medicine | Admitting: Internal Medicine

## 2022-01-08 VITALS — BP 110/78 | HR 72 | Ht 67.0 in | Wt 215.8 lb

## 2022-01-08 DIAGNOSIS — I428 Other cardiomyopathies: Secondary | ICD-10-CM

## 2022-01-08 DIAGNOSIS — I493 Ventricular premature depolarization: Secondary | ICD-10-CM

## 2022-01-08 DIAGNOSIS — I5042 Chronic combined systolic (congestive) and diastolic (congestive) heart failure: Secondary | ICD-10-CM

## 2022-01-08 NOTE — Patient Instructions (Addendum)
Medication Instructions:  Your physician recommends that you continue on your current medications as directed. Please refer to the Current Medication list given to you today.  *If you need a refill on your cardiac medications before your next appointment, please call your pharmacy*   Lab Work: None ordered.  If you have labs (blood work) drawn today and your tests are completely normal, you will receive your results only by: Mark (if you have MyChart) OR A paper copy in the mail If you have any lab test that is abnormal or we need to change your treatment, we will call you to review the results.   Testing/Procedures: None ordered.    Follow-Up: At [facility], you and your health needs are our priority.  As part of our continuing mission to provide you with exceptional heart care, we have created designated Provider Care Teams.  These Care Teams include your primary Cardiologist (physician) and Advanced Practice Providers (APPs -  Physician Assistants and Nurse Practitioners) who all work together to provide you with the care you need, when you need it.  We recommend signing up for the patient portal called "MyChart".  Sign up information is provided on this After Visit Summary.  MyChart is used to connect with patients for Virtual Visits (Telemedicine).  Patients are able to view lab/test results, encounter notes, upcoming appointments, etc.  Non-urgent messages can be sent to your provider as well.   To learn more about what you can do with MyChart, go to [email or web address].    Your next appointment:   12 months with Dr Olin Pia PA - we will send you a reminder letter  Important Information About Sugar

## 2022-01-08 NOTE — Progress Notes (Signed)
Patient Care Team: [facility], [facility] as PCP - General Deboraha Sprang, MD as PCP - Electrophysiology (Cardiology) Leonie Man, MD as PCP - Cardiology (Cardiology) Maryellen Pile, MD (Inactive) as Resident (Internal Medicine)   HPI  Ricky Hayden is a 76 y.o. male seen in followup for CRT. St Jude implanted 9/21 I had failed an LV lead placement.  Dr. Lovena Le undertook left bundle branch block area pacing with post implant QRS duration of 106 ms.  RV output is programmed subthreshold   Cardiac consultation note 08/03/2019 describes a low ejection fraction heart failure on and off since 2016.  Denies coronary disease or bypass surgery.  Cath not been done * Hospitalized 4/21 for COVID pneumonia    Numerous hospitalizations secondary to prolonged infections over the last year. Complicated by atrial fibrillation with rapid rates prompting cardioversion.  Started on amiodarone  My impression had been that the patient was going to be followed at the [facility]  Recently underwent lithotripsy and bag skin extraction of stones because of recurrent UTIs  Weak but with no chest pain, dypsnea w exertion but no edema   Date Cr K Hgb TSH  9//20 (scanned)   .  1.37 4.2 14.5    6/23  1.27 4.3    13.2 3.345      DATE TEST EF    10/17 Echo  30-35%    10/20 Echo   30-35 %     4/21 Echo  20% RV dysfn severe  5/21 LHC  Cors min obstruction  8/21 Echo  20-25% RV function normal   4/23 Echo  40-45%     ZIO  PVCs < 1%    Records and Results Reviewed   Past Medical History:  Diagnosis Date   Arthritis    osteoarthritis of left knee   Ascending aorta dilatation (HCC)    Balanitis    recurrent   Cardiac arrhythmia    life threatening, secondary to CCB vs b- blockers   Cardiomyopathy ([place])    Chronic joint pain    Chronic systolic CHF (congestive heart failure) (HCC)    CKD (chronic kidney disease), stage III (HCC)    COPD (chronic obstructive pulmonary disease) ([place])     Coronary artery disease    Dilated aortic root (HCC)    Dyspnea    Enlarged prostate    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 Sherwood Gambler   Nocturia    Obesity    Pansinusitis    a.  complicated by brain abscess and bleeding requiring craniotomy in 1995.   PONV (postoperative nausea and vomiting)    Presence of permanent cardiac pacemaker    Sinusitis    s/p ethmoidectomy and nasal septoplasty   Vertigo    intermitantly    Past Surgical History:  Procedure Laterality Date   BIV UPGRADE N/A 01/29/2020   Procedure: BIV UPGRADE;  Surgeon: Evans Lance, MD;  Location: [place] [facility];  Service: Cardiovascular;  Laterality: N/A;   CARDIOVERSION N/A 09/26/2021   Procedure: CARDIOVERSION;  Surgeon: [name], MD;  Location: [place];  Service: Cardiovascular;  Laterality: N/A;   CIRCUMCISION N/A 11/20/2013   Procedure: CIRCUMCISION ADULT;  Surgeon: Elta Guadeloupe  Nedra Hai, MD;  Location: [facility];  Service: Urology;  Laterality: N/A;   CRANIOTOMY  1995   hematomy due to sinus infection    CYSTOSCOPY/URETEROSCOPY/HOLMIUM LASER/STENT PLACEMENT Left 12/23/2021   Procedure: LEFT URETEROSCOPY/HOLMIUM LASER LITHOTRIPSY/STENT PLACEMENT retrograde;  Surgeon: Vira Agar, MD;  Location: [facility];  Service: Urology;  Laterality: Left;  90 MINUTES NEEDED FOR CASE   PACEMAKER IMPLANT N/A 01/17/2020   Procedure: PACEMAKER IMPLANT;  Surgeon: Deboraha Sprang, MD;  Location: [place] [facility];  Service: Cardiovascular;  Laterality: N/A;   RIGHT/LEFT HEART CATH AND CORONARY ANGIOGRAPHY N/A 09/20/2019   Procedure: RIGHT/LEFT HEART CATH AND CORONARY ANGIOGRAPHY;  Surgeon: Jolaine Artist, MD;  Location: [place] [facility];  Service: Cardiovascular;  Laterality: N/A;   shoulder  surg rt   1995   SINUS SURGERY WITH INSTATRAK     ethmoidectomy and nasal septum repair   TEE WITHOUT CARDIOVERSION N/A 09/26/2021   Procedure: TRANSESOPHAGEAL ECHOCARDIOGRAM (TEE);  Surgeon: [name], MD;  Location: [facility];  Service: Cardiovascular;  Laterality: N/A;    Current Meds  Medication Sig   acetaminophen (TYLENOL) 500 MG tablet Take 1,000 mg by mouth every 6 (six) hours as needed for moderate pain.   albuterol (PROVENTIL HFA;VENTOLIN HFA) 108 (90 BASE) MCG/ACT inhaler Inhale 2 puffs into the lungs every 6 (six) hours as needed for wheezing.   amiodarone (PACERONE) 200 MG tablet TAKE ONE PILL TWICE A DAY TILL 06/12. THEN DECREASE TO ONE PILL DAILY STARTING 06/13 (Patient taking differently: Take 200 mg by mouth daily.)   carvedilol (COREG) 12.5 MG tablet Take 1 tablet (12.5 mg total) by mouth 2 (two) times daily with a meal. (Patient taking differently: Take 6.25 mg by mouth 2 (two) times daily with a meal.)   cefdinir (OMNICEF) 300 MG capsule Take 1 capsule (300 mg total) by mouth 2 (two) times daily.   finasteride (PROSCAR) 5 MG tablet TAKE 1/2 TABLET BY MOUTH DAILY   fluticasone furoate-vilanterol (BREO ELLIPTA) 200-25 MCG/ACT AEPB Inhale 1 puff into the lungs daily.   fluticasone-salmeterol (WIXELA INHUB) 250-50 MCG/ACT AEPB Inhale 2 puffs into the lungs in the morning and at bedtime.   Melatonin 10 MG CAPS Take 10 mg by mouth at bedtime.   Multiple Vitamins-Minerals (ICAPS AREDS 2 PO) Take 1 tablet by mouth daily.   pantoprazole (PROTONIX) 40 MG tablet Take 1 tablet (40 mg total) by mouth daily.   PHENobarbital (LUMINAL) 64.8 MG tablet Take 1 tablet (64.8 mg total) by mouth 2 (two) times daily.   polyethylene glycol (MIRALAX / GLYCOLAX) 17 g packet Take 17 g by mouth 2 (two) times daily. (Patient taking differently: Take 17 g by mouth daily as needed for moderate constipation.)   pravastatin (PRAVACHOL) 40 MG tablet TAKE 1 TABLET BY MOUTH EVERY DAY IN THE EVENING    sacubitril-valsartan (ENTRESTO) 24-26 MG Take 1 tablet by mouth 2 (two) times daily.   spironolactone (ALDACTONE) 25 MG tablet Take 0.5 tablets (12.5 mg total) by mouth daily.   tamsulosin (FLOMAX) 0.4 MG CAPS capsule Take 1 capsule (0.4 mg total) by mouth daily after supper.   Tiotropium Bromide Monohydrate (SPIRIVA RESPIMAT) 2.5 MCG/ACT AERS Inhale 2 each into the lungs 2 (two) times daily.   torsemide (DEMADEX) 20 MG tablet Take 2 tablets (40 mg total) by mouth daily. MAy take an additional 20 mg  or 40 mg in the afternoon if 3 lbs or 5 lbs overnight weight gain. (Patient taking differently: Take 20 mg  by mouth 2 (two) times daily.)   umeclidinium bromide (INCRUSE ELLIPTA) 62.5 MCG/ACT AEPB Inhale 1 puff into the lungs daily.   Vitamin D3 (VITAMIN D) 25 MCG tablet Take 1 tablet (1,000 Units total) by mouth daily.   warfarin (COUMADIN) 3 MG tablet Take 1 tablet (3 mg total) by mouth daily at 4 PM.    Allergies  Allergen Reactions   Calcium Channel Blockers Other (See Comments)    Came to hospital in 1995-caused chest pain    Jardiance [Empagliflozin]     reacted with phenobarbital      Review of Systems negative except from HPI and PMH  Physical Exam BP 110/78   Pulse 72   Ht 5\' 7"  (1.702 m)   Wt 215 lb 12.8 oz (97.9 kg)   SpO2 97%   BMI 33.80 kg/m    Well developed and nourished in no acute distress HENT normal Neck supple   Clear Regular rate and rhythm, no murmurs or gallops Abd-soft with active BS No Clubbing cyanosis edema Skin-warm and dry A & Oriented  Grossly normal sensory and motor function  ECG sinus at 72 Intervals 23/12/46  Assessment and  Plan  Nonischemic cardiomyopathy  Left bundle branch block/first-degree AV block  Congestive heart failure-chronic-systolic class III  COPD-oxygen dependent  RV function/dysfunction-variable echoes  CRTP-Saint Jude  Patient is euvolemic.   Device function is normal.  I saw or for alternatives.  We wil to  l reach out to Pharmacy  Current medicines are reviewed at length with the patient today .  The patient does not have concerns regarding medicines.

## 2022-01-09 LAB — CUP PACEART INCLINIC DEVICE CHECK
Battery Remaining Longevity: 88 mo
Battery Voltage: 3.01 V
Brady Statistic RA Percent Paced: 12 %
Brady Statistic RV Percent Paced: 91 %
Date Time Interrogation Session: 20230907120253
Implantable Lead Implant Date: 20210915
Implantable Lead Implant Date: 20210915
Implantable Lead Implant Date: 20210927
Implantable Lead Location: 753858
Implantable Lead Location: 753859
Implantable Lead Location: 753860
Implantable Lead Model: 3830
Implantable Pulse Generator Implant Date: 20210915
Lead Channel Impedance Value: 462.5 Ohm
Lead Channel Impedance Value: 487.5 Ohm
Lead Channel Impedance Value: 550 Ohm
Lead Channel Pacing Threshold Amplitude: 0.5 V
Lead Channel Pacing Threshold Amplitude: 0.5 V
Lead Channel Pacing Threshold Amplitude: 0.75 V
Lead Channel Pacing Threshold Amplitude: 0.75 V
Lead Channel Pacing Threshold Amplitude: 2.75 V
Lead Channel Pacing Threshold Amplitude: 2.75 V
Lead Channel Pacing Threshold Pulse Width: 0.05 ms
Lead Channel Pacing Threshold Pulse Width: 0.05 ms
Lead Channel Pacing Threshold Pulse Width: 0.4 ms
Lead Channel Pacing Threshold Pulse Width: 0.4 ms
Lead Channel Pacing Threshold Pulse Width: 0.7 ms
Lead Channel Pacing Threshold Pulse Width: 0.7 ms
Lead Channel Sensing Intrinsic Amplitude: 12 mV
Lead Channel Sensing Intrinsic Amplitude: 3.1 mV
Lead Channel Setting Pacing Amplitude: 0.25 V
Lead Channel Setting Pacing Amplitude: 1.5 V
Lead Channel Setting Pacing Amplitude: 2 V
Lead Channel Setting Pacing Pulse Width: 0.05 ms
Lead Channel Setting Pacing Pulse Width: 0.4 ms
Lead Channel Setting Sensing Sensitivity: 2 mV
Pulse Gen Model: 3222
Pulse Gen Serial Number: 9188373

## 2022-01-14 ENCOUNTER — Ambulatory Visit (INDEPENDENT_AMBULATORY_CARE_PROVIDER_SITE_OTHER): Payer: No Typology Code available for payment source

## 2022-01-14 DIAGNOSIS — Z5181 Encounter for therapeutic drug level monitoring: Secondary | ICD-10-CM | POA: Diagnosis not present

## 2022-01-14 DIAGNOSIS — I480 Paroxysmal atrial fibrillation: Secondary | ICD-10-CM | POA: Diagnosis not present

## 2022-01-14 LAB — POCT INR: INR: 1.6 — AB (ref 2.0–3.0)

## 2022-01-14 NOTE — Patient Instructions (Signed)
Description   Spoke with Nira Conn, RN with [facility] and instructed for pt to take 2 tablets today and then START taking 1 tablet daily except 1.5 tablets on Saturdays.  Recheck INR in 1 week.  Anticoagulation Clinic [phone]

## 2022-01-21 ENCOUNTER — Ambulatory Visit (INDEPENDENT_AMBULATORY_CARE_PROVIDER_SITE_OTHER): Payer: No Typology Code available for payment source

## 2022-01-21 DIAGNOSIS — Z5181 Encounter for therapeutic drug level monitoring: Secondary | ICD-10-CM

## 2022-01-21 LAB — POCT INR: INR: 2.4 (ref 2.0–3.0)

## 2022-01-21 NOTE — Patient Instructions (Signed)
Description   Spoke with Claiborne Billings, RN with [facility] and instructed for pt to continue taking 1 tablet daily except 1.5 tablets on Saturdays.  Recheck INR in 1 week.  Anticoagulation Clinic [phone]

## 2022-01-26 ENCOUNTER — Ambulatory Visit (INDEPENDENT_AMBULATORY_CARE_PROVIDER_SITE_OTHER): Payer: No Typology Code available for payment source

## 2022-01-26 ENCOUNTER — Ambulatory Visit (HOSPITAL_COMMUNITY)
Admission: RE | Admit: 2022-01-26 | Discharge: 2022-01-26 | Disposition: A | Discharge status: Home / Self Care | Payer: No Typology Code available for payment source | Source: Ambulatory Visit | Attending: Physician Assistant | Admitting: Physician Assistant

## 2022-01-26 ENCOUNTER — Telehealth: Payer: Self-pay | Admitting: Cardiology

## 2022-01-26 VITALS — BP 84/66 | HR 122 | Ht 67.0 in | Wt 219.2 lb

## 2022-01-26 DIAGNOSIS — I447 Left bundle-branch block, unspecified: Secondary | ICD-10-CM | POA: Diagnosis not present

## 2022-01-26 DIAGNOSIS — E1122 Type 2 diabetes mellitus with diabetic chronic kidney disease: Secondary | ICD-10-CM | POA: Insufficient documentation

## 2022-01-26 DIAGNOSIS — I2510 Atherosclerotic heart disease of native coronary artery without angina pectoris: Secondary | ICD-10-CM | POA: Diagnosis not present

## 2022-01-26 DIAGNOSIS — I5022 Chronic systolic (congestive) heart failure: Secondary | ICD-10-CM | POA: Diagnosis not present

## 2022-01-26 DIAGNOSIS — E669 Obesity, unspecified: Secondary | ICD-10-CM | POA: Insufficient documentation

## 2022-01-26 DIAGNOSIS — Z950 Presence of cardiac pacemaker: Secondary | ICD-10-CM | POA: Insufficient documentation

## 2022-01-26 DIAGNOSIS — Z7901 Long term (current) use of anticoagulants: Secondary | ICD-10-CM | POA: Insufficient documentation

## 2022-01-26 DIAGNOSIS — Z79899 Other long term (current) drug therapy: Secondary | ICD-10-CM | POA: Insufficient documentation

## 2022-01-26 DIAGNOSIS — I4892 Unspecified atrial flutter: Secondary | ICD-10-CM | POA: Insufficient documentation

## 2022-01-26 DIAGNOSIS — Z5181 Encounter for therapeutic drug level monitoring: Secondary | ICD-10-CM | POA: Diagnosis not present

## 2022-01-26 DIAGNOSIS — Z6834 Body mass index (BMI) 34.0-34.9, adult: Secondary | ICD-10-CM | POA: Insufficient documentation

## 2022-01-26 DIAGNOSIS — I130 Hypertensive heart and chronic kidney disease with heart failure and stage 1 through stage 4 chronic kidney disease, or unspecified chronic kidney disease: Secondary | ICD-10-CM | POA: Insufficient documentation

## 2022-01-26 DIAGNOSIS — J449 Chronic obstructive pulmonary disease, unspecified: Secondary | ICD-10-CM | POA: Insufficient documentation

## 2022-01-26 DIAGNOSIS — D6869 Other thrombophilia: Secondary | ICD-10-CM | POA: Diagnosis not present

## 2022-01-26 DIAGNOSIS — E785 Hyperlipidemia, unspecified: Secondary | ICD-10-CM | POA: Diagnosis not present

## 2022-01-26 DIAGNOSIS — I480 Paroxysmal atrial fibrillation: Secondary | ICD-10-CM

## 2022-01-26 LAB — CBC
HCT: 43.9 % (ref 39.0–52.0)
Hemoglobin: 14.3 g/dL (ref 13.0–17.0)
MCH: 30.3 pg (ref 26.0–34.0)
MCHC: 32.6 g/dL (ref 30.0–36.0)
MCV: 93 fL (ref 80.0–100.0)
Platelets: 146 10*3/uL — ABNORMAL LOW (ref 150–400)
RBC: 4.72 MIL/uL (ref 4.22–5.81)
RDW: 12.9 % (ref 11.5–15.5)
WBC: 5.7 10*3/uL (ref 4.0–10.5)
nRBC: 0 % (ref 0.0–0.2)

## 2022-01-26 LAB — BASIC METABOLIC PANEL
Anion gap: 10 (ref 5–15)
BUN: 36 mg/dL — ABNORMAL HIGH (ref 8–23)
CO2: 22 mmol/L (ref 22–32)
Calcium: 9.1 mg/dL (ref 8.9–10.3)
Chloride: 106 mmol/L (ref 98–111)
Creatinine, Ser: 1.76 mg/dL — ABNORMAL HIGH (ref 0.61–1.24)
GFR, Estimated: 39 mL/min — ABNORMAL LOW (ref >60)
Glucose, Bld: 91 mg/dL (ref 70–99)
Potassium: 4.4 mmol/L (ref 3.5–5.1)
Sodium: 138 mmol/L (ref 135–145)

## 2022-01-26 LAB — PROTIME-INR
INR: 1.9 — ABNORMAL HIGH (ref 0.8–1.2)
Prothrombin Time: 21.7 seconds — ABNORMAL HIGH (ref 11.4–15.2)

## 2022-01-26 MED ORDER — CARVEDILOL 12.5 MG PO TABS: 6.2500 mg | ORAL_TABLET | Freq: Two times a day (BID) | ORAL | Status: DC

## 2022-01-26 MED ORDER — AMIODARONE HCL 200 MG PO TABS: ORAL_TABLET | ORAL | Status: DC

## 2022-01-26 NOTE — Telephone Encounter (Signed)
STAT if HR is under 50 or over 120 (normal HR is 60-100 beats per minute)  What is your heart rate? 126, 89, 125 this morning  Do you have a log of your heart rate readings (document readings)? Yes   Do you have any other symptoms? Patient had weight jump from 214.6 on Saturday morning to 218 on Sunday morning . Daughter said she accidentally gave patient daughter's Zoloft yesterday morning. Started Amoxcillin on Saturday for dental procedure

## 2022-01-26 NOTE — Telephone Encounter (Signed)
Spoke to patient daughter. She states patient heart rate  has been  elevated  a few times over the past   couple of days.    Ranging 89  to 123 to 125. Patient usually  heart rate is in the 60's to 80's.  Daughter is concerned heart rate was 125 last night and   this morning  123.  He just took  morning medications. Patient  blood pressure this morning 96/78 Oxygen saturation 98 %.  To her knowledge he has not missed any dose of warfarin.  No complaints other than heart rate elevated.     Daughter also stated patient accidental was given  100 mg  dose of Zoloft. She has spoken to pharmacist and poison control.   Daughter also noticed patient weight is up from   Friday 214 to  Saturday 218 . She states she did not see the difference until today.  Per  medication  direction if patient weight is still up  today  he may take a additional 20 mg torsemide.    Appointment given for  Afib clinic today - 2 pm  direction and code given  to daughter. Daughter verbalized understanding.

## 2022-01-26 NOTE — Patient Instructions (Signed)
Description   Spoke with pt and family member over the phone, instructed for pt to take 2 tablets today and then continue taking 1 tablet daily except 1.5 tablets on Saturdays. TEE/DCCV ON 10/4 - INSTRUCTED [place] INR ON 10/4 IN A.M.  Recheck INR in 1 week.  Anticoagulation Clinic [phone]

## 2022-01-26 NOTE — Patient Instructions (Signed)
Cardioversion scheduled for Wednesday, October 4th  -Go to coumadin clinic for INR check prior to arrival (they will call with appt time)  - Arrive at the [facility] and go to admitting at 1230pm  - Do not eat or drink anything after midnight the night prior to your procedure.  - Take all your morning medication (except diabetic medications) with a sip of water prior to arrival.  - You will not be able to drive home after your procedure.  - Do NOT miss any doses of your blood thinner - if you should miss a dose please notify our office immediately.  - If you feel as if you go back into normal rhythm prior to scheduled cardioversion, please notify our office immediately. If your procedure is canceled in the cardioversion suite you will be charged a cancellation fee.

## 2022-01-26 NOTE — Progress Notes (Signed)
Primary Care Physician: [facility] Primary Cardiologist: Dr Ellyn Hack Primary Electrophysiologist: Dr Caryl Comes Referring Physician: [facility]   Ricky Hayden is a 76 y.o. male with a history of chronic HFrEF s/p CRT-P 2021, COPD, CKD, HTN, HLD, DM, atrial fibrillation who presents for consultation in the [facility]. He was admitted to the hospital in April 2021 due to acute hypoxic respiratory failure and COPD.  EF was less than 20%.  Once the patient recovered, he underwent a left and right heart cath on 09/20/2019 that showed minimal CAD, nonischemic cardiomyopathy, mild to moderately elevated filling pressures with normal cardiac output.  Subsequent Zio patch monitor in July 2021 showed less than 1% PVCs.  Although previous office note mention CRT-D, however patient really has CRT-P instead.  His cardiomyopathy was felt to be related to bundle branch block, however it is also related to recurrent A-fib with RVR.  EF was 20 to 25% on echocardiogram in August 2021.  He underwent [employer] dual-chamber pacemaker on 01/17/2020, a left bundle area pacing lead was placed a few days later on 01/29/2020 upgrading the dual-chamber pacemaker to CRT-P. Due to the need for chronic phenobarbital, and medication to medication interaction, he is on Coumadin rather than NOAC.  He was admitted at [facility] in December 2022 due to acute CHF exacerbation and atrial flutter.  Patient had unsuccessful TEE DCCV on 12/3, he was successfully cardioverted with DCCV on 12/8 after IV diuresis.  Patient has a CHADS2VASC score of 6.  Patient was admitted back to the hospital in April 2023 with acute respiratory failure, fever and chills.  He was placed on BiPAP for shortness of breath, urinalysis was concerning for urinary tract infection.  He was treated for urosepsis with IV antibiotic.  CT of the abdomen showed a nonobstructive renal stone and exophytic lesion of the left kidney.  He was  supposed to follow-up with urology service.  Blood culture positive for Proteus and he was treated with 2 weeks of IV antibiotic.  TTE obtained on 08/29/2021 showed EF 40 to 45%.  On 09/02/2021, patient had decreased level of consciousness due to increased oxygen requirement, he required intubation and had brief PEA arrest after his intubation.  Chest x-ray concerning for aspiration with HCAP.  He then developed septic shock requiring IV fluid bolus and pressors.  Hospital course complicated by ATN secondary to urosepsis and shock.  Patient was eventually extubated 10 days later.  Hospital course was also complicated by occurrence of regular wide-complex tachycardia with heart rate up to 130.  Cardiology service consulted at the time and suspected the patient had atrial flutter with RVR.  He ultimately underwent TEE DCCV by Dr. Oval Linsey on 09/26/2021.  EF was 30 to 35% on TEE.  Previous home amiodarone was increased in dosage.    Admitted again 10/2021 with pyelonephritis, Jardiance discontinued. He was in afib with RVR but spontaneously converted to SR.   On follow up today, patient's daughter reports that his heart rate went up to 120s bpm on 01/24/22. His weight is up ~ 3 lbs. Overall, he feels well today and is not very aware of his arrhythmia. Of note, he has a tooth infection and is currently on antibiotics.   Today, he denies symptoms of palpitations, chest pain, shortness of breath, orthopnea, PND, lower extremity edema, dizziness, presyncope, syncope, snoring, daytime somnolence, bleeding, or neurologic sequela. The patient is tolerating medications without difficulties and is otherwise without complaint today.  Atrial Fibrillation Risk Factors:  he does not have symptoms or diagnosis of sleep apnea. he does not have a history of rheumatic fever.   he has a BMI of Body mass index is 34.33 kg/m.Marland Kitchen Filed Weights   01/26/22 1402  Weight: 99.4 kg    Family History  Problem Relation Age of  Onset   Heart failure Mother 16   Heart attack Father 50     Atrial Fibrillation Management history:  Previous antiarrhythmic drugs: amiodarone  Previous cardioversions: 09/26/21 Previous ablations: none CHADS2VASC score: 6 Anticoagulation history: warfarin    Past Medical History:  Diagnosis Date   Arthritis    osteoarthritis of left knee   Ascending aorta dilatation (HCC)    Balanitis    recurrent   Cardiac arrhythmia    life threatening, secondary to CCB vs b- blockers   Cardiomyopathy ([place])    Chronic joint pain    Chronic systolic CHF (congestive heart failure) (HCC)    CKD (chronic kidney disease), stage III (HCC)    COPD (chronic obstructive pulmonary disease) (HCC)    Coronary artery disease    Dilated aortic root (HCC)    Dyspnea    Enlarged prostate    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 Sherwood Gambler   Nocturia    Obesity    Pansinusitis    a.  complicated by brain abscess and bleeding requiring craniotomy in 1995.   PONV (postoperative nausea and vomiting)    Presence of permanent cardiac pacemaker    Sinusitis    s/p ethmoidectomy and nasal septoplasty   Vertigo    intermitantly   Past Surgical History:  Procedure Laterality Date   BIV UPGRADE N/A 01/29/2020   Procedure: BIV UPGRADE;  Surgeon: Evans Lance, MD;  Location: [facility];  Service: Cardiovascular;  Laterality: N/A;   CARDIOVERSION N/A 09/26/2021   Procedure: CARDIOVERSION;  Surgeon: [name], MD;  Location: [facility];  Service: Cardiovascular;  Laterality: N/A;   CIRCUMCISION N/A 11/20/2013   Procedure: CIRCUMCISION ADULT;  Surgeon: Claybon Jabs, MD;  Location: [facility];  Service: Urology;  Laterality: N/A;   CRANIOTOMY  1995   hematomy due to sinus  infection    CYSTOSCOPY/URETEROSCOPY/HOLMIUM LASER/STENT PLACEMENT Left 12/23/2021   Procedure: LEFT URETEROSCOPY/HOLMIUM LASER LITHOTRIPSY/STENT PLACEMENT retrograde;  Surgeon: Vira Agar, MD;  Location: [facility];  Service: Urology;  Laterality: Left;  90 MINUTES NEEDED FOR CASE   PACEMAKER IMPLANT N/A 01/17/2020   Procedure: PACEMAKER IMPLANT;  Surgeon: Deboraha Sprang, MD;  Location: [facility];  Service: Cardiovascular;  Laterality: N/A;   RIGHT/LEFT HEART CATH AND CORONARY ANGIOGRAPHY N/A 09/20/2019   Procedure: RIGHT/LEFT HEART CATH AND CORONARY ANGIOGRAPHY;  Surgeon: Jolaine Artist, MD;  Location: [facility];  Service: Cardiovascular;  Laterality: N/A;   shoulder surg rt   1995   SINUS SURGERY WITH INSTATRAK     ethmoidectomy and nasal septum repair   TEE WITHOUT CARDIOVERSION N/A 09/26/2021   Procedure: TRANSESOPHAGEAL ECHOCARDIOGRAM (TEE);  Surgeon: [name], MD;  Location: [facility];  Service: Cardiovascular;  Laterality: N/A;    Current Outpatient Medications  Medication Sig Dispense Refill   acetaminophen (TYLENOL) 500 MG tablet Take  1,000 mg by mouth every 6 (six) hours as needed for moderate pain.     albuterol (PROVENTIL HFA;VENTOLIN HFA) 108 (90 BASE) MCG/ACT inhaler Inhale 2 puffs into the lungs every 6 (six) hours as needed for wheezing. 1 Inhaler 11   amoxicillin (AMOXIL) 500 MG capsule Take 500 mg by mouth 3 (three) times daily.     finasteride (PROSCAR) 5 MG tablet TAKE 1/2 TABLET BY MOUTH DAILY 30 tablet 0   fluticasone furoate-vilanterol (BREO ELLIPTA) 200-25 MCG/ACT AEPB Inhale 1 puff into the lungs daily. 28 each 1   Melatonin 10 MG CAPS Take 10 mg by mouth as needed.     Multiple Vitamins-Minerals (ICAPS AREDS 2 PO) Take 1 tablet by mouth in the morning and at bedtime.     pantoprazole (PROTONIX) 40 MG tablet Take 1 tablet (40 mg total) by mouth daily. 30 tablet 0   PHENobarbital (LUMINAL) 64.8 MG tablet Take 1 tablet (64.8 mg total)  by mouth 2 (two) times daily. 180 tablet 3   polyethylene glycol (MIRALAX / GLYCOLAX) 17 g packet Take 17 g by mouth 2 (two) times daily. (Patient taking differently: Take 17 g by mouth daily as needed for moderate constipation.) 60 each 1   pravastatin (PRAVACHOL) 40 MG tablet TAKE 1 TABLET BY MOUTH EVERY DAY IN THE EVENING 90 tablet 0   sacubitril-valsartan (ENTRESTO) 24-26 MG Take 1 tablet by mouth 2 (two) times daily. 180 tablet 3   spironolactone (ALDACTONE) 25 MG tablet Take 0.5 tablets (12.5 mg total) by mouth daily. 45 tablet 3   Tiotropium Bromide Monohydrate (SPIRIVA RESPIMAT) 2.5 MCG/ACT AERS Inhale 2 each into the lungs 2 (two) times daily.     torsemide (DEMADEX) 20 MG tablet Take 2 tablets (40 mg total) by mouth daily. MAy take an additional 20 mg  or 40 mg in the afternoon if 3 lbs or 5 lbs overnight weight gain. (Patient taking differently: Take 20 mg by mouth 2 (two) times daily.) 360 tablet 1   Vitamin D3 (VITAMIN D) 25 MCG tablet Take 1 tablet (1,000 Units total) by mouth daily. 60 tablet 0   warfarin (COUMADIN) 3 MG tablet Take 1 tablet (3 mg total) by mouth daily at 4 PM. 90 tablet 1   amiodarone (PACERONE) 200 MG tablet Take one tablet by mouth in the evening     carvedilol (COREG) 12.5 MG tablet Take 0.5 tablets (6.25 mg total) by mouth 2 (two) times daily with a meal.     fluticasone-salmeterol (WIXELA INHUB) 250-50 MCG/ACT AEPB Inhale 2 puffs into the lungs in the morning and at bedtime. (Patient not taking: Reported on 01/26/2022)     tamsulosin (FLOMAX) 0.4 MG CAPS capsule Take 1 capsule (0.4 mg total) by mouth daily after supper. (Patient not taking: Reported on 01/26/2022) 14 capsule 0   umeclidinium bromide (INCRUSE ELLIPTA) 62.5 MCG/ACT AEPB Inhale 1 puff into the lungs daily. (Patient not taking: Reported on 01/26/2022) 30 each 1   No current facility-administered medications for this encounter.    Allergies  Allergen Reactions   Calcium Channel Blockers Other (See  Comments)    Came to hospital in 1995-caused chest pain    Jardiance [Empagliflozin]     reacted with phenobarbital    Social History   Socioeconomic History   Marital status: Widowed    Spouse name: Not on file   Number of children: Not on file   Years of education: Not on file   Highest education level: Not on file  Occupational History   Not on file  Tobacco Use   Smoking status: Former    Types: Cigarettes    Quit date: 05/04/1986    Years since quitting: 35.7   Smokeless tobacco: Never  Vaping Use   Vaping Use: Never used  Substance and Sexual Activity   Alcohol use: No    Alcohol/week: 0.0 standard drinks of alcohol   Drug use: No   Sexual activity: Yes  Other Topics Concern   Not on file  Social History Narrative   Not on file   Social Determinants of Health   Financial Resource Strain: Not on file  Food Insecurity: Not on file  Transportation Needs: Not on file  Physical Activity: Not on file  Stress: Not on file  Social Connections: Not on file  Intimate Partner Violence: Not on file     ROS- All systems are reviewed and negative except as per the HPI above.  Physical Exam: Vitals:   01/26/22 1402  BP: (!) 84/66  Pulse: (!) 122  Weight: 99.4 kg  Height: 5\' 7"  (1.702 m)    GEN- The patient is a well appearing obese elderly male, alert and oriented x 3 today.   Head- normocephalic, atraumatic Eyes-  Sclera clear, conjunctiva pink Ears- hearing intact Oropharynx- clear Neck- supple  Lungs- Clear to ausculation bilaterally, normal work of breathing Heart- irregular rate and rhythm, no murmurs, rubs or gallops  GI- soft, NT, ND, + BS Extremities- no clubbing, cyanosis, or edema MS- no significant deformity or atrophy Skin- no rash or lesion Psych- euthymic mood, full affect Neuro- strength and sensation are intact  Wt Readings from Last 3 Encounters:  01/26/22 99.4 kg  01/08/22 97.9 kg  12/23/21 98 kg    EKG today demonstrates  Atrial  flutter with variable block, LBBB, PVC Vent. rate 122 BPM PR interval * ms QRS duration 182 ms QT/QTcB 406/578 ms  Echo 08/29/21 demonstrated   1. Left ventricular ejection fraction, by estimation, is 40 to 45%. The  left ventricle has mildly decreased function. The left ventricle has no  regional wall motion abnormalities. There is mild left ventricular  hypertrophy. Left ventricular diastolic parameters are consistent with Grade I diastolic dysfunction (impaired relaxation).   2. Right ventricular systolic function is normal. The right ventricular  size is normal.   3. The mitral valve is normal in structure. No evidence of mitral valve regurgitation. No evidence of mitral stenosis.   4. The aortic valve is normal in structure. There is moderate  calcification of the aortic valve. There is moderate thickening of the  aortic valve. Aortic valve regurgitation is not visualized. Aortic valve  sclerosis/calcification is present, without any   evidence of aortic stenosis.   5. Aortic dilatation noted. There is mild dilatation of the ascending  aorta, measuring 41 mm.   6. The inferior vena cava is normal in size with greater than 50%  respiratory variability, suggesting right atrial pressure of 3 mmHg.   Comparison(s): Prior images reviewed side by side. The left ventricular function has improved.   Epic records are reviewed at length today  CHA2DS2-VASc Score = 6  The patient's score is based upon: CHF History: 1 HTN History: 1 Diabetes History: 1 Stroke History: 0 Vascular Disease History: 1 Age Score: 2 Gender Score: 0       ASSESSMENT AND PLAN: 1. Persistent Atrial Fibrillation/atrial flutter The patient's CHA2DS2-VASc score is 6, indicating a 9.7% annual risk of stroke.   Patient back  in persistent atrial flutter, suspect related to recent infection as this has been a trigger for him in the past.  We discussed rhythm control options. Will arrange for TEE/DCCV as he is on  warfarin and I don't believe we should wait for 4 therapeutic INRs. We discussed ED precautions and would have a very low threshold to take him to the ED given his propensity to decompensate. He is stable today.  Continue amiodarone 200 mg daily Continue carvedilol 12.5 mg BID Continue warfarin  2. Secondary Hypercoagulable State (ICD10:  D68.69) The patient is at significant risk for stroke/thromboembolism based upon his CHA2DS2-VASc Score of 6.  Continue Warfarin (Coumadin).   3. Obesity Body mass index is 34.33 kg/m. Lifestyle modification was discussed at length including regular exercise and weight reduction.  4. HTN Low today, no dizziness or presyncope.   5. Chronic HFrEF S/p CRT-P, followed by Dr Caryl Comes and the device clinic. GDMT per primary cardiology team. Weight up 3 lbs. Patient directed from triage to take an additional torsemide.    Follow up in the AF clinic post DCCV.    [facility] [address] [place], [place] [address] [phone] 01/26/2022 2:30 PM

## 2022-01-29 ENCOUNTER — Ambulatory Visit (INDEPENDENT_AMBULATORY_CARE_PROVIDER_SITE_OTHER): Payer: No Typology Code available for payment source | Admitting: Cardiovascular Disease

## 2022-01-29 DIAGNOSIS — Z5181 Encounter for therapeutic drug level monitoring: Secondary | ICD-10-CM | POA: Diagnosis not present

## 2022-01-29 LAB — POCT INR: INR: 2.2 (ref 2.0–3.0)

## 2022-01-31 ENCOUNTER — Emergency Department (HOSPITAL_COMMUNITY): Payer: No Typology Code available for payment source

## 2022-01-31 ENCOUNTER — Other Ambulatory Visit: Payer: Self-pay

## 2022-01-31 ENCOUNTER — Inpatient Hospital Stay (HOSPITAL_COMMUNITY)
Admission: EM | Admit: 2022-01-31 | Discharge: 2022-02-05 | DRG: 308 | Disposition: A | Discharge status: Home Health | Payer: No Typology Code available for payment source | Attending: Internal Medicine | Admitting: Internal Medicine

## 2022-01-31 DIAGNOSIS — I5082 Biventricular heart failure: Secondary | ICD-10-CM | POA: Diagnosis present

## 2022-01-31 DIAGNOSIS — Z950 Presence of cardiac pacemaker: Secondary | ICD-10-CM

## 2022-01-31 DIAGNOSIS — I4892 Unspecified atrial flutter: Secondary | ICD-10-CM

## 2022-01-31 DIAGNOSIS — E785 Hyperlipidemia, unspecified: Secondary | ICD-10-CM | POA: Diagnosis present

## 2022-01-31 DIAGNOSIS — Z7901 Long term (current) use of anticoagulants: Secondary | ICD-10-CM

## 2022-01-31 DIAGNOSIS — I447 Left bundle-branch block, unspecified: Secondary | ICD-10-CM | POA: Diagnosis present

## 2022-01-31 DIAGNOSIS — Z79899 Other long term (current) drug therapy: Secondary | ICD-10-CM

## 2022-01-31 DIAGNOSIS — I130 Hypertensive heart and chronic kidney disease with heart failure and stage 1 through stage 4 chronic kidney disease, or unspecified chronic kidney disease: Secondary | ICD-10-CM | POA: Diagnosis present

## 2022-01-31 DIAGNOSIS — Z6835 Body mass index (BMI) 35.0-35.9, adult: Secondary | ICD-10-CM

## 2022-01-31 DIAGNOSIS — Z7951 Long term (current) use of inhaled steroids: Secondary | ICD-10-CM

## 2022-01-31 DIAGNOSIS — K047 Periapical abscess without sinus: Secondary | ICD-10-CM | POA: Diagnosis present

## 2022-01-31 DIAGNOSIS — I2510 Atherosclerotic heart disease of native coronary artery without angina pectoris: Secondary | ICD-10-CM | POA: Diagnosis present

## 2022-01-31 DIAGNOSIS — Z87891 Personal history of nicotine dependence: Secondary | ICD-10-CM

## 2022-01-31 DIAGNOSIS — I4891 Unspecified atrial fibrillation: Secondary | ICD-10-CM | POA: Diagnosis not present

## 2022-01-31 DIAGNOSIS — I5023 Acute on chronic systolic (congestive) heart failure: Secondary | ICD-10-CM | POA: Diagnosis present

## 2022-01-31 DIAGNOSIS — J9601 Acute respiratory failure with hypoxia: Secondary | ICD-10-CM | POA: Diagnosis present

## 2022-01-31 DIAGNOSIS — N179 Acute kidney failure, unspecified: Secondary | ICD-10-CM

## 2022-01-31 DIAGNOSIS — J449 Chronic obstructive pulmonary disease, unspecified: Secondary | ICD-10-CM | POA: Diagnosis not present

## 2022-01-31 DIAGNOSIS — E669 Obesity, unspecified: Secondary | ICD-10-CM | POA: Diagnosis present

## 2022-01-31 DIAGNOSIS — Z23 Encounter for immunization: Secondary | ICD-10-CM

## 2022-01-31 DIAGNOSIS — I428 Other cardiomyopathies: Secondary | ICD-10-CM | POA: Diagnosis present

## 2022-01-31 DIAGNOSIS — E1122 Type 2 diabetes mellitus with diabetic chronic kidney disease: Secondary | ICD-10-CM | POA: Diagnosis present

## 2022-01-31 DIAGNOSIS — Z8616 Personal history of COVID-19: Secondary | ICD-10-CM

## 2022-01-31 DIAGNOSIS — N1832 Chronic kidney disease, stage 3b: Secondary | ICD-10-CM | POA: Diagnosis present

## 2022-01-31 DIAGNOSIS — I714 Abdominal aortic aneurysm, without rupture, unspecified: Secondary | ICD-10-CM | POA: Diagnosis present

## 2022-01-31 DIAGNOSIS — I4821 Permanent atrial fibrillation: Secondary | ICD-10-CM | POA: Diagnosis present

## 2022-01-31 DIAGNOSIS — I7121 Aneurysm of the ascending aorta, without rupture: Secondary | ICD-10-CM | POA: Diagnosis present

## 2022-01-31 DIAGNOSIS — I480 Paroxysmal atrial fibrillation: Secondary | ICD-10-CM | POA: Diagnosis not present

## 2022-01-31 DIAGNOSIS — D6959 Other secondary thrombocytopenia: Secondary | ICD-10-CM | POA: Diagnosis present

## 2022-01-31 DIAGNOSIS — K219 Gastro-esophageal reflux disease without esophagitis: Secondary | ICD-10-CM | POA: Diagnosis present

## 2022-01-31 DIAGNOSIS — Z20822 Contact with and (suspected) exposure to covid-19: Secondary | ICD-10-CM | POA: Diagnosis present

## 2022-01-31 DIAGNOSIS — N400 Enlarged prostate without lower urinary tract symptoms: Secondary | ICD-10-CM | POA: Diagnosis present

## 2022-01-31 DIAGNOSIS — Z8249 Family history of ischemic heart disease and other diseases of the circulatory system: Secondary | ICD-10-CM

## 2022-01-31 DIAGNOSIS — I2721 Secondary pulmonary arterial hypertension: Secondary | ICD-10-CM | POA: Diagnosis present

## 2022-01-31 LAB — CBC
HCT: 38.7 % — ABNORMAL LOW (ref 39.0–52.0)
Hemoglobin: 12.8 g/dL — ABNORMAL LOW (ref 13.0–17.0)
MCH: 30.5 pg (ref 26.0–34.0)
MCHC: 33.1 g/dL (ref 30.0–36.0)
MCV: 92.4 fL (ref 80.0–100.0)
Platelets: 126 10*3/uL — ABNORMAL LOW (ref 150–400)
RBC: 4.19 MIL/uL — ABNORMAL LOW (ref 4.22–5.81)
RDW: 13.4 % (ref 11.5–15.5)
WBC: 6.8 10*3/uL (ref 4.0–10.5)
nRBC: 0 % (ref 0.0–0.2)

## 2022-01-31 LAB — COMPREHENSIVE METABOLIC PANEL
ALT: 20 U/L (ref 0–44)
AST: 19 U/L (ref 15–41)
Albumin: 3.7 g/dL (ref 3.5–5.0)
Alkaline Phosphatase: 55 U/L (ref 38–126)
Anion gap: 10 (ref 5–15)
BUN: 50 mg/dL — ABNORMAL HIGH (ref 8–23)
CO2: 23 mmol/L (ref 22–32)
Calcium: 9 mg/dL (ref 8.9–10.3)
Chloride: 104 mmol/L (ref 98–111)
Creatinine, Ser: 1.92 mg/dL — ABNORMAL HIGH (ref 0.61–1.24)
GFR, Estimated: 35 mL/min — ABNORMAL LOW (ref >60)
Glucose, Bld: 97 mg/dL (ref 70–99)
Potassium: 4.2 mmol/L (ref 3.5–5.1)
Sodium: 137 mmol/L (ref 135–145)
Total Bilirubin: 0.7 mg/dL (ref 0.3–1.2)
Total Protein: 7 g/dL (ref 6.5–8.1)

## 2022-01-31 LAB — RESP PANEL BY RT-PCR (FLU A&B, COVID) ARPGX2
Influenza A by PCR: NEGATIVE
Influenza B by PCR: NEGATIVE
SARS Coronavirus 2 by RT PCR: NEGATIVE

## 2022-01-31 LAB — TROPONIN I (HIGH SENSITIVITY): Troponin I (High Sensitivity): 23 ng/L — ABNORMAL HIGH (ref <18)

## 2022-01-31 LAB — BRAIN NATRIURETIC PEPTIDE: B Natriuretic Peptide: 1144.5 pg/mL — ABNORMAL HIGH (ref 0.0–100.0)

## 2022-01-31 MED ORDER — SODIUM CHLORIDE 0.9 % IV SOLN
3.0000 g | Freq: Once | INTRAVENOUS | Status: AC
  Administered 2022-01-31: 3 g via INTRAVENOUS
  Filled 2022-01-31: qty 8

## 2022-01-31 MED ORDER — IOHEXOL 350 MG/ML SOLN
75.0000 mL | Freq: Once | INTRAVENOUS | Status: AC | PRN
  Administered 2022-01-31: 75 mL via INTRAVENOUS

## 2022-01-31 MED ORDER — AMIODARONE HCL IN DEXTROSE 360-4.14 MG/200ML-% IV SOLN
60.0000 mg/h | INTRAVENOUS | Status: DC
  Administered 2022-02-01: 60 mg/h via INTRAVENOUS
  Filled 2022-01-31: qty 200

## 2022-01-31 MED ORDER — AMIODARONE LOAD VIA INFUSION
150.0000 mg | Freq: Once | INTRAVENOUS | Status: AC
  Administered 2022-02-01: 150 mg via INTRAVENOUS
  Filled 2022-01-31: qty 83.34

## 2022-01-31 MED ORDER — LIDOCAINE 5 % EX PTCH
1.0000 | MEDICATED_PATCH | CUTANEOUS | Status: DC
  Administered 2022-01-31: 1 via TRANSDERMAL
  Filled 2022-01-31: qty 1

## 2022-01-31 MED ORDER — FUROSEMIDE 10 MG/ML IJ SOLN
40.0000 mg | Freq: Once | INTRAMUSCULAR | Status: AC
  Administered 2022-01-31: 40 mg via INTRAVENOUS
  Filled 2022-01-31: qty 4

## 2022-01-31 MED ORDER — LACTATED RINGERS IV BOLUS
500.0000 mL | Freq: Once | INTRAVENOUS | Status: AC
  Administered 2022-01-31: 500 mL via INTRAVENOUS

## 2022-01-31 MED ORDER — AMIODARONE HCL IN DEXTROSE 360-4.14 MG/200ML-% IV SOLN
30.0000 mg/h | INTRAVENOUS | Status: DC
  Administered 2022-02-01 – 2022-02-04 (×6): 30 mg/h via INTRAVENOUS
  Filled 2022-01-31 (×8): qty 200

## 2022-01-31 NOTE — ED Provider Notes (Signed)
[facility] EMERGENCY DEPARTMENT Provider Note  CSN: [id number] Arrival date & time: 01/31/22 1841  Chief Complaint(s) Atrial Fibrillation and Weakness  HPI Ricky Hayden is a 76 y.o. male with PMH COPD, CKD, HTN, HLD, T2DM, A-fib with extensive cardiac history and ultimate Saint Jude pacemaker placement, multiple hospital admissions for A-fib with RVR usually driven by septic source who presents emergency department for evaluation of generalized weakness, shortness of breath and A-fib with RVR.  Patient was seen on 01/26/2022 by his primary cardiologist's and they arrange for outpatient TEE cardioversion as the patient was in persistent A-fib at that time.  Given very strict return precautions in terms of ER presentation and he ultimately saw his nephrologist who instructed that he needs to come to the emergency department for further evaluation.  The is also scheduled to have a tooth removed on Monday by the dentist and there is some suspicion that this infection may be driving his A-fib with RVR today.  Patient needs medical clearance by cardiology prior to removal of this tooth.  He is currently on amoxicillin.  Here in the emergency department he complains of some back pain and abdominal pain as well as shortness of breath.  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 Sherwood Gambler   Nocturia    Obesity    Pansinusitis    a.  complicated by brain abscess and bleeding requiring craniotomy in 1995.   PONV (postoperative nausea and vomiting)    Presence of permanent cardiac pacemaker    Sinusitis    s/p ethmoidectomy and nasal septoplasty   Vertigo    intermitantly   Patient Active Problem List   Diagnosis Date Noted   Atrial flutter ([place]) 01/26/2022   Hypercoagulable state due to paroxysmal atrial fibrillation ([place]) 01/26/2022   Pyelonephritis 10/27/2021   Severe sepsis ([place]) 10/27/2021   Aortic aneurysm ([place]) 10/27/2021   Bilateral renal cysts 10/27/2021   Edema of left lower leg 10/26/2021   Encounter for therapeutic drug monitoring 10/14/2021   Biventricular ICD (implantable cardioverter-defibrillator) in place    Hypotension 09/26/2021   Debility 09/19/2021   Cardiac arrest ([place]) 09/18/2021   Delirium 09/18/2021   Morbid obesity ([place]) 09/18/2021   Nephrolithiasis 09/18/2021   Paroxysmal atrial fibrillation ([place]) 09/17/2021   History of craniotomy 09/17/2021   Seizure disorder ([place]) 09/17/2021   Normocytic anemia 09/17/2021   Pressure injury of sacral region, stage 2 ([place]) 09/17/2021   Goals of care, counseling/discussion    Type 2 diabetes mellitus with chronic kidney disease, without long-term current use of insulin ([place]) 09/14/2021  Bacteremia due to Gram-negative bacteria    Complicated UTI (urinary tract infection)    Acute on chronic respiratory failure with hypoxia and hypercapnia (HCC)    Septic shock (HCC)    Proteus (mirabilis) (morganii) as the cause of diseases classified elsewhere 09/02/2021   ARF (acute renal failure) ([place]) 08/29/2021   Presence of biventricular cardiac pacemaker 05/15/2020   PVC (premature ventricular contraction) 12/11/2019   NICM (nonischemic cardiomyopathy)  ([place]) 12/11/2019   Stage 3b chronic kidney disease (CKD) ([place]) 08/12/2019   Hyperkalemia    Chronic combined systolic and diastolic CHF, NYHA class 3 ([place]) 06/10/2015   Leg swelling 07/16/2014   Preventative health care 03/07/2014   COPD (chronic obstructive pulmonary disease) ([place]) 09/11/2013   BPPV (benign paroxysmal positional vertigo) 10/05/2012   BPH (benign prostatic hyperplasia) 01/10/2008   Gout 04/22/2006   ERECTILE DYSFUNCTION 04/22/2006   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 Authorizing Provider  acetaminophen (TYLENOL) 500 MG tablet Take 1,000 mg by mouth every 6 (six) hours as needed for moderate pain.    [provider]  albuterol (PROVENTIL HFA;VENTOLIN HFA) 108 (90 BASE) MCG/ACT inhaler Inhale 2 puffs into the lungs every 6 (six) hours as needed for wheezing. 12/10/14   Maryellen Pile, MD  amiodarone (PACERONE) 200 MG tablet Take one tablet by mouth in the evening 01/26/22   Fenton, Clint R, PA  amoxicillin (AMOXIL) 500 MG capsule Take 500 mg by mouth 3 (three) times daily. 01/24/22   [provider]  carvedilol (COREG) 12.5 MG tablet Take 0.5 tablets (6.25 mg total) by mouth 2 (two) times daily with a meal. 01/26/22   Fenton, Clint R, PA  finasteride (PROSCAR) 5 MG tablet TAKE 1/2 TABLET BY MOUTH DAILY 11/28/21   Jennye Boroughs, MD  fluticasone furoate-vilanterol (BREO ELLIPTA) 200-25 MCG/ACT AEPB Inhale 1 puff into the lungs daily. Patient not taking: Reported on 01/29/2022 10/10/21   Love, Ivan Anchors, PA-C  fluticasone-salmeterol [facility] INHUB) 250-50 MCG/ACT AEPB Inhale 1 puff into the lungs in the morning and at bedtime.    [provider]  Melatonin 10 MG CAPS Take 10 mg by mouth  at bedtime as needed (sleep).    [provider]  Multiple Vitamins-Minerals (PRESERVISION AREDS 2 PO) Take 1 tablet by mouth in the morning and at bedtime.    [provider]  pantoprazole (PROTONIX) 40 MG tablet Take 1 tablet (40 mg total) by mouth daily. 10/10/21   Love, Ivan Anchors, PA-C  PHENobarbital (LUMINAL) 64.8 MG tablet Take 1 tablet (64.8 mg total) by mouth 2 (two) times daily. 07/24/15   Corky Sox, MD  polyethylene glycol (MIRALAX / GLYCOLAX) 17 g packet Take 17 g by mouth 2 (two) times daily. Patient taking differently: Take 17 g by mouth daily as needed for moderate constipation. 10/10/21   Love, Ivan Anchors, PA-C  pravastatin (PRAVACHOL) 40 MG tablet TAKE 1 TABLET BY MOUTH EVERY DAY IN THE EVENING 11/28/21   Jennye Boroughs, MD  sacubitril-valsartan (ENTRESTO) 24-26 MG Take 1 tablet by mouth 2 (two) times daily. 10/10/21   Love, Ivan Anchors, PA-C  spironolactone (ALDACTONE) 25 MG tablet Take 0.5 tablets (12.5 mg total) by mouth daily. 10/20/21   Leonie Man, MD  tamsulosin (FLOMAX) 0.4 MG  CAPS capsule Take 1 capsule (0.4 mg total) by mouth daily after supper. Patient not taking: Reported on 01/26/2022 12/23/21   Vira Agar, MD  Tiotropium Bromide Monohydrate (SPIRIVA RESPIMAT) 2.5 MCG/ACT AERS Inhale 2 each into the lungs every evening.    [provider]  torsemide (DEMADEX) 20 MG tablet Take 2 tablets (40 mg total) by mouth daily. MAy take an additional 20 mg  or 40 mg in the afternoon if 3 lbs or 5 lbs overnight weight gain. Patient taking differently: Take 40 mg by mouth in the morning. 12/09/21   Deberah Pelton, NP  umeclidinium bromide (INCRUSE ELLIPTA) 62.5 MCG/ACT AEPB Inhale 1 puff into the lungs daily. Patient not taking: Reported on 01/29/2022 10/10/21   Bary Leriche, PA-C  Vitamin D3 (VITAMIN D) 25 MCG tablet Take 1 tablet (1,000 Units total) by mouth daily. 10/10/21   Love, Ivan Anchors, PA-C  warfarin (COUMADIN) 3 MG tablet Take 1 tablet (3 mg total) by  mouth daily at 4 PM. Patient taking differently: Take 1.5-3 mg by mouth See admin instructions. 12/09/21   Deberah Pelton, NP                                                                                                                                    Past Surgical History Past Surgical History:  Procedure Laterality Date   BIV UPGRADE N/A 01/29/2020   Procedure: BIV UPGRADE;  Surgeon: Evans Lance, MD;  Location: [facility];  Service: Cardiovascular;  Laterality: N/A;   CARDIOVERSION N/A 09/26/2021   Procedure: CARDIOVERSION;  Surgeon: [name], MD;  Location: [facility];  Service: Cardiovascular;  Laterality: N/A;   CIRCUMCISION N/A 11/20/2013   Procedure: CIRCUMCISION ADULT;  Surgeon: Claybon Jabs, MD;  Location: [facility];  Service: Urology;  Laterality: N/A;   CRANIOTOMY  1995   hematomy due to sinus infection    CYSTOSCOPY/URETEROSCOPY/HOLMIUM LASER/STENT PLACEMENT Left 12/23/2021   Procedure: LEFT URETEROSCOPY/HOLMIUM LASER LITHOTRIPSY/STENT PLACEMENT retrograde;  Surgeon: Vira Agar, MD;  Location: [facility];  Service: Urology;  Laterality: Left;  90 MINUTES NEEDED FOR CASE   PACEMAKER IMPLANT N/A 01/17/2020   Procedure: PACEMAKER IMPLANT;  Surgeon: Deboraha Sprang, MD;  Location: [facility];  Service: Cardiovascular;  Laterality: N/A;   RIGHT/LEFT HEART CATH AND CORONARY ANGIOGRAPHY N/A 09/20/2019   Procedure: RIGHT/LEFT HEART CATH AND CORONARY ANGIOGRAPHY;  Surgeon: Jolaine Artist, MD;  Location: [facility];  Service: Cardiovascular;  Laterality: N/A;   shoulder surg rt   1995   SINUS SURGERY WITH INSTATRAK     ethmoidectomy and nasal septum repair   TEE WITHOUT CARDIOVERSION N/A 09/26/2021   Procedure: TRANSESOPHAGEAL ECHOCARDIOGRAM (TEE);  Surgeon: [name], MD;  [facility]: [facility];  Service: Cardiovascular;  Laterality: N/A;   Family History Family History  Problem Relation Age of Onset   Heart failure Mother 90  Heart attack Father 48    Social History Social History   Tobacco Use   Smoking status: Former    Types: Cigarettes    Quit date: 05/04/1986    Years since quitting: 35.7   Smokeless tobacco: Never  Vaping Use   Vaping Use: Never used  Substance Use Topics   Alcohol use: No    Alcohol/week: 0.0 standard drinks of alcohol   Drug use: No   Allergies Calcium channel blockers and Jardiance [empagliflozin]  Review of Systems Review of Systems  HENT:  Positive for dental problem.   Respiratory:  Positive for shortness of breath.   Cardiovascular:  Positive for palpitations.  Musculoskeletal:  Positive for back pain.    Physical Exam Vital Signs  I have reviewed the triage vital signs BP 91/68   Pulse (!) 126   Temp 99 F (37.2 C)   Resp (!) 22   SpO2 (!) 87%   Physical Exam Constitutional:      General: He is not in acute distress.    Appearance: Normal appearance.  HENT:     Head: Normocephalic and atraumatic.     Nose: No congestion or rhinorrhea.  Eyes:     General:        Right eye: No discharge.        Left eye: No discharge.     Extraocular Movements: Extraocular movements intact.     Pupils: Pupils are equal, round, and reactive to light.  Cardiovascular:     Rate and Rhythm: Tachycardia present. Rhythm irregular.     Heart sounds: No murmur heard. Pulmonary:     Effort: No respiratory distress.     Breath sounds: No wheezing or rales.  Abdominal:     General: There is no distension.     Tenderness: There is no abdominal tenderness.  Musculoskeletal:        General: Tenderness present. Normal range of motion.     Cervical back: Normal range of motion.     Right lower leg: Edema present.     Left lower leg: Edema present.  Skin:    General: Skin is warm and dry.  Neurological:     General: No focal deficit present.     Mental Status: He is alert.     ED Results and Treatments Labs (all labs ordered are listed, but only abnormal results are  displayed) Labs Reviewed  CBC - Abnormal; Notable for the following components:      Result Value   RBC 4.19 (*)    Hemoglobin 12.8 (*)    HCT 38.7 (*)    Platelets 126 (*)    All other components within normal limits  BRAIN NATRIURETIC PEPTIDE - Abnormal; Notable for the following components:   B Natriuretic Peptide 1,144.5 (*)    All other components within normal limits  COMPREHENSIVE METABOLIC PANEL - Abnormal; Notable for the following components:   BUN 50 (*)    Creatinine, Ser 1.92 (*)    GFR, Estimated 35 (*)    All other components within normal limits  TROPONIN I (HIGH SENSITIVITY) - Abnormal; Notable for the following components:   Troponin I (High Sensitivity) 23 (*)    All other components within normal limits  RESP PANEL BY RT-PCR (FLU A&B, COVID) ARPGX2  PROTIME-INR  TROPONIN I (HIGH SENSITIVITY)  Radiology CT CHEST ABDOMEN PELVIS W CONTRAST  Result Date: 01/31/2022 CLINICAL DATA:  Sepsis.  Atrial fibrillation with weakness. EXAM: CT CHEST, ABDOMEN, AND PELVIS WITH CONTRAST TECHNIQUE: Multidetector CT imaging of the chest, abdomen and pelvis was performed following the standard protocol during bolus administration of intravenous contrast. RADIATION DOSE REDUCTION: This exam was performed according to the departmental dose-optimization program which includes automated exposure control, adjustment of the mA and/or kV according to patient size and/or use of iterative reconstruction technique. CONTRAST:  71mL OMNIPAQUE IOHEXOL 350 MG/ML SOLN COMPARISON:  12/11/2021. FINDINGS: CT CHEST FINDINGS Cardiovascular: The heart is enlarged and there is no pericardial effusion. Pacemaker leads are present in the heart. There is atherosclerotic calcification of the aorta with mild aneurysmal dilatation of the ascending aorta measuring 4.1 cm. The pulmonary trunk is  distended suggesting underlying pulmonary artery hypertension. Mediastinum/Nodes: No mediastinal, hilar, or axillary lymphadenopathy. The thyroid gland, trachea, and esophagus are within normal limits. Lungs/Pleura: Apical pleural scarring is noted bilaterally. Mild paraseptal and centrilobular emphysematous changes are present in the lungs. Mild atelectasis is noted bilaterally. No effusion or pneumothorax. Musculoskeletal: Pacemaker device is present in the anterior left chest. Degenerative changes are noted in the thoracic spine. A mild compression deformity is noted in the superior endplate at T3. There is bony deformity of the sternum with sclerosis, which is likely chronic. CT ABDOMEN PELVIS FINDINGS Hepatobiliary: No focal liver abnormality is seen. Mild hepatic steatosis. No gallstones, gallbladder wall thickening, or biliary dilatation. Pancreas: Unremarkable. No pancreatic ductal dilatation or surrounding inflammatory changes. Spleen: Normal in size without focal abnormality. Adrenals/Urinary Tract: The adrenal glands are within normal limits. Kidneys enhance symmetrically. Multiple renal cysts are noted bilaterally. Additional subcentimeter hypodensities are present in the kidneys which are too small to further characterize. There is a complex exophytic hypodensity in the lower pole the left kidney measuring 1.2 cm in the lower pole of the left kidney, previously characterized as hemorrhagic or proteinaceous cyst. A stable 1.2 cm exophytic hyperdense lesion in the lower pole of the left kidney. There is a nonobstructive calculus in the lower pole the left kidney. No hydroureteronephrosis bilaterally. The bladder is unremarkable. Stomach/Bowel: Stomach is within normal limits. Appendix appears normal. No evidence of bowel wall thickening, distention, or inflammatory changes. No free air or pneumatosis. Scattered diverticula are present along the colon without evidence of diverticulitis. Vascular/Lymphatic:  Aortic atherosclerosis with aneurysmal dilatation of the infrarenal abdominal aorta measuring 3.1 cm. No enlarged abdominal or pelvic lymph nodes. Reproductive: Prostate is unremarkable. Other: No abdominopelvic ascites. There is diastasis of the rectus abdominus with a umbilical hernia containing fat and nonobstructed bowel. Musculoskeletal: Degenerative changes in the lumbar spine. No acute osseous abnormality. IMPRESSION: 1. No acute process in the chest, abdomen, or pelvis. 2. 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 Recommend annual imaging followup by CTA or MRA. This recommendation follows 2010 ACCF/AHA/AATS/ACR/ASA/SCA/SCAI/SIR/STS/SVM Guidelines for the Diagnosis and Management of Patients with Thoracic Aortic Disease. Circulation. 2010; 121: U045-W098. Aortic aneurysm NOS (ICD10-I71.9) 3. Aneurysmal dilatation of the infrarenal abdominal aorta measuring 3.1 cm. Recommend follow-up ultrasound every 3 years. 4. Dilatation of the pulmonary trunk suggesting underlying pulmonary artery hypertension. 5. Remaining incidental findings as described above. Electronically Signed   By: Brett Fairy M.D.   On: 01/31/2022 23:38   DG Chest Portable 1 View  Result Date: 01/31/2022 CLINICAL DATA:  dyspnea EXAM: PORTABLE CHEST 1 VIEW COMPARISON:  November 02, 2021 FINDINGS: The cardiomediastinal silhouette is unchanged enlarged  in contour.LEFT chest cardiac pacing device. Biapical pleuroparenchymal scarring. No pleural effusion. No pneumothorax. Questionable nodular opacity in the LEFT lung, likely prominent vascular shadow. Visualized abdomen is unremarkable. IMPRESSION: Similar appearance of cardiomegaly. There is a questionable nodular opacity in the LEFT perihilar lung, likely a prominent vascular shadow. Recommend repeat PA and lateral chest radiograph for improved evaluation versus CT chest with contrast. Electronically Signed   By: Valentino Saxon M.D.   On:  01/31/2022 20:38    Pertinent labs & imaging results that were available during my care of the patient were reviewed by me and considered in my medical decision making (see MDM for details).  Medications Ordered in ED Medications  lidocaine (LIDODERM) 5 % 1 patch (1 patch Transdermal Patch Applied 01/31/22 2329)  amiodarone (NEXTERONE) 1.8 mg/mL load via infusion 150 mg (has no administration in time range)    Followed by  amiodarone (NEXTERONE PREMIX) 360-4.14 MG/200ML-% (1.8 mg/mL) IV infusion (has no administration in time range)    Followed by  amiodarone (NEXTERONE PREMIX) 360-4.14 MG/200ML-% (1.8 mg/mL) IV infusion (has no administration in time range)  Ampicillin-Sulbactam (UNASYN) 3 g in sodium chloride 0.9 % 100 mL IVPB (3 g Intravenous New Bag/Given 01/31/22 2340)  lactated ringers bolus 500 mL (0 mLs Intravenous Stopped 01/31/22 2312)  furosemide (LASIX) injection 40 mg (40 mg Intravenous Given 01/31/22 2341)  iohexol (OMNIPAQUE) 350 MG/ML injection 75 mL (75 mLs Intravenous Contrast Given 01/31/22 2232)                                                                                                                                     Procedures .Critical Care  Performed by: Teressa Lower, MD Authorized by: Teressa Lower, MD   Critical care provider statement:    Critical care time (minutes):  30   Critical care was necessary to treat or prevent imminent or life-threatening deterioration of the following conditions:  Cardiac failure   Critical care was time spent personally by me on the following activities:  Development of treatment plan with patient or surrogate, discussions with consultants, evaluation of patient's response to treatment, examination of patient, ordering and review of laboratory studies, ordering and review of radiographic studies, ordering and performing treatments and interventions, pulse oximetry, re-evaluation of patient's condition and review of old  charts   (including critical care time)  Medical Decision Making / ED Course   This patient presents to the ED for concern of shortness of breath, rapid heart rate, this involves an extensive number of treatment options, and is a complaint that carries with it a high risk of complications and morbidity.  The differential diagnosis includes A-fib with RVR, sepsis, dehydration, fluid overload, CHF exacerbation  MDM: Patient seen emergency room for evaluation of multiple complaints described above.  Physical exam reveals an irregular tachycardia with bilateral lower extremity edema and tenderness in the mid T-spine.  Laboratory evaluation with a BUN  of 50, creatinine 1.92 which is a mild elevation for the patient.  A gentle fluid bolus was given.  Hemoglobin 12.8, BNP elevated to 1144.5 which is a significant elevation for the patient, initial high-sensitivity troponin 23.  COVID and flu negative.  Chest x-ray with a questionable nodule in the left lung.  CT chest abdomen pelvis obtained to screen for septic source of his A-fib with RVR today which was reassuringly negative for acute process in the chest abdomen pelvis but does show an ascending aortic aneurysm at 4.1 cm, infrarenal abdominal aneurysm approximately 3.1 cm and pulmonary arterial hypertension.  A single dose of Unasyn was given for his tooth and patient was started on amiodarone drip for his A-fib with RVR.  I spoke with the cardiologist on-call Dr. Alfred Levins who states that the patient will require diuresis before cardioversion can be performed and Lasix administered.  Patient require hospital admission for A-fib with RVR on amiodarone drip and need for diuresis.  Patient then admitted   Additional history obtained: -Additional history obtained from daughter -External records from outside source obtained and reviewed including: Chart review including previous notes, labs, imaging, consultation notes   Lab Tests: -I ordered, reviewed, and  interpreted labs.   The pertinent results include:   Labs Reviewed  CBC - Abnormal; Notable for the following components:      Result Value   RBC 4.19 (*)    Hemoglobin 12.8 (*)    HCT 38.7 (*)    Platelets 126 (*)    All other components within normal limits  BRAIN NATRIURETIC PEPTIDE - Abnormal; Notable for the following components:   B Natriuretic Peptide 1,144.5 (*)    All other components within normal limits  COMPREHENSIVE METABOLIC PANEL - Abnormal; Notable for the following components:   BUN 50 (*)    Creatinine, Ser 1.92 (*)    GFR, Estimated 35 (*)    All other components within normal limits  TROPONIN I (HIGH SENSITIVITY) - Abnormal; Notable for the following components:   Troponin I (High Sensitivity) 23 (*)    All other components within normal limits  RESP PANEL BY RT-PCR (FLU A&B, COVID) ARPGX2  PROTIME-INR  TROPONIN I (HIGH SENSITIVITY)      EKG   EKG Interpretation  Date/Time:  Saturday January 31 2022 19:32:38 EDT Ventricular Rate:  122 PR Interval:    QRS Duration: 168 QT Interval:  406 QTC Calculation: 578 R Axis:   -61 Text Interpretation: afib with rvr and LBBB Left bundle branch block Abnormal ECG When compared with ECG of 26-Jan-2022 14:17, PREVIOUS ECG IS PRESENT Confirmed by Keshan Reha ([id number]) on 01/31/2022 8:57:22 PM         Imaging Studies ordered: I ordered imaging studies including CXR, CTCAP I independently visualized and interpreted imaging. I agree with the radiologist interpretation   Medicines ordered and prescription drug management: Meds ordered this encounter  Medications   lactated ringers bolus 500 mL   lidocaine (LIDODERM) 5 % 1 patch   FOLLOWED BY Linked Order Group    amiodarone (NEXTERONE) 1.8 mg/mL load via infusion 150 mg    amiodarone (NEXTERONE PREMIX) 360-4.14 MG/200ML-% (1.8 mg/mL) IV infusion    amiodarone (NEXTERONE PREMIX) 360-4.14 MG/200ML-% (1.8 mg/mL) IV infusion   furosemide (LASIX) injection 40 mg    iohexol (OMNIPAQUE) 350 MG/ML injection 75 mL   Ampicillin-Sulbactam (UNASYN) 3 g in sodium chloride 0.9 % 100 mL IVPB    Order Specific Question:   Antibiotic Indication:  Answer:   Other Indication (list below)    Order Specific Question:   Other Indication:    Answer:   oral infection    -I have reviewed the patients home medicines and have made adjustments as needed  Critical interventions Diuresis, amiodarone drip  Consultations Obtained: I requested consultation with the cardiologist on-call,  and discussed lab and imaging findings as well as pertinent plan - they recommend: Amiodarone and diuresis   Cardiac Monitoring: The patient was maintained on a cardiac monitor.  I personally viewed and interpreted the cardiac monitored which showed an underlying rhythm of: A-fib with RVR  Social Determinants of Health:  Factors impacting patients care include: none   Reevaluation: After the interventions noted above, I reevaluated the patient and found that they have :improved  Co morbidities that complicate the patient evaluation  Past Medical History:  Diagnosis Date   Arthritis    osteoarthritis of left knee   Ascending aorta dilatation (HCC)    Balanitis    recurrent   Cardiac arrhythmia    life threatening, secondary to CCB vs b- blockers   Cardiomyopathy (HCC)    Chronic joint pain    Chronic systolic CHF (congestive heart failure) (HCC)    CKD (chronic kidney disease), stage III (HCC)    COPD (chronic obstructive pulmonary disease) ([place])    Coronary artery disease    Dilated aortic root (HCC)    Dyspnea    Enlarged prostate    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 Sherwood Gambler   Nocturia    Obesity    Pansinusitis    a.  complicated by  brain abscess and bleeding requiring craniotomy in 1995.   PONV (postoperative nausea and vomiting)    Presence of permanent cardiac pacemaker    Sinusitis    s/p ethmoidectomy and nasal septoplasty   Vertigo    intermitantly      Dispostion: I considered admission for this patient, and due to A-fib with RVR on amiodarone drip, patient require hospital admission     Final Clinical Impression(s) / ED Diagnoses Final diagnoses:  None     @PCDICTATION @    Teressa Lower, MD 01/31/22 [phone]

## 2022-01-31 NOTE — ED Triage Notes (Signed)
Pt here for atrial fibrillation and feeling weak and tired. Per daughter, pt has been in a-fib for approx 1 week but pt has refused to come to ER. Pt currently has a mouth infection that he is on an antibiotic for. Pt reports feeling like he couldn't breathe last night. Pt denies cp, reports back pain. Has [employer] pacemaker.

## 2022-01-31 NOTE — ED Notes (Signed)
Male purwick in place.

## 2022-01-31 NOTE — ED Provider Triage Note (Signed)
Emergency Medicine Provider Triage Evaluation Note  Ricky Hayden , a 76 y.o. male  was evaluated in triage.  Pt complains of weakness and shortness of breath.  Patient was seen by cardiology on Monday who stated that they thought he was atrial fibrillation since last Friday.  The patient had a dental infection and cardiology did not want to consider cardioversion into the patient had the infection treated.  The patient was seen by dentistry and placed on amoxicillin.  He has been taking amoxicillin for the past few days.  He was seen yesterday at the [facility] for nephrology who recommended that he go by ambulance to the emergency department for atrial fibrillation.  He refused at that time.  Today he complains of feeling very fatigued and short of breath.  Patient has a pacemaker, history of CHF, history of A-fib.  Denies chest pain at this time.  Denies nausea, vomiting, abdominal pain.  Review of Systems  Positive: As above Negative: As above  Physical Exam  BP (!) 83/69   Pulse (!) 131   Temp 99.1 F (37.3 C) (Oral)   Resp 15   SpO2 95%  Gen:   Awake, no distress   Resp:  Normal effort  MSK:   Moves extremities without difficulty  Other:    Medical Decision Making  Medically screening exam initiated at 7:34 PM.  Appropriate orders placed.  Ricky Hayden was informed that the remainder of the evaluation will be completed by another provider, this initial triage assessment does not replace that evaluation, and the importance of remaining in the ED until their evaluation is complete.     Ronny Bacon 01/31/22 1936

## 2022-02-01 ENCOUNTER — Encounter (HOSPITAL_COMMUNITY): Payer: Self-pay | Admitting: Family Medicine

## 2022-02-01 DIAGNOSIS — I5023 Acute on chronic systolic (congestive) heart failure: Secondary | ICD-10-CM | POA: Diagnosis present

## 2022-02-01 DIAGNOSIS — Z20822 Contact with and (suspected) exposure to covid-19: Secondary | ICD-10-CM | POA: Diagnosis present

## 2022-02-01 DIAGNOSIS — Z8616 Personal history of COVID-19: Secondary | ICD-10-CM | POA: Diagnosis not present

## 2022-02-01 DIAGNOSIS — I480 Paroxysmal atrial fibrillation: Secondary | ICD-10-CM | POA: Diagnosis present

## 2022-02-01 DIAGNOSIS — I428 Other cardiomyopathies: Secondary | ICD-10-CM | POA: Diagnosis present

## 2022-02-01 DIAGNOSIS — Z7901 Long term (current) use of anticoagulants: Secondary | ICD-10-CM | POA: Diagnosis not present

## 2022-02-01 DIAGNOSIS — I4891 Unspecified atrial fibrillation: Secondary | ICD-10-CM | POA: Diagnosis not present

## 2022-02-01 DIAGNOSIS — I361 Nonrheumatic tricuspid (valve) insufficiency: Secondary | ICD-10-CM | POA: Diagnosis not present

## 2022-02-01 DIAGNOSIS — E1122 Type 2 diabetes mellitus with diabetic chronic kidney disease: Secondary | ICD-10-CM | POA: Diagnosis present

## 2022-02-01 DIAGNOSIS — I2721 Secondary pulmonary arterial hypertension: Secondary | ICD-10-CM | POA: Diagnosis present

## 2022-02-01 DIAGNOSIS — M1990 Unspecified osteoarthritis, unspecified site: Secondary | ICD-10-CM | POA: Diagnosis not present

## 2022-02-01 DIAGNOSIS — K047 Periapical abscess without sinus: Secondary | ICD-10-CM | POA: Diagnosis not present

## 2022-02-01 DIAGNOSIS — E669 Obesity, unspecified: Secondary | ICD-10-CM | POA: Diagnosis present

## 2022-02-01 DIAGNOSIS — I5082 Biventricular heart failure: Secondary | ICD-10-CM | POA: Diagnosis present

## 2022-02-01 DIAGNOSIS — I340 Nonrheumatic mitral (valve) insufficiency: Secondary | ICD-10-CM | POA: Diagnosis not present

## 2022-02-01 DIAGNOSIS — E119 Type 2 diabetes mellitus without complications: Secondary | ICD-10-CM | POA: Diagnosis not present

## 2022-02-01 DIAGNOSIS — I081 Rheumatic disorders of both mitral and tricuspid valves: Secondary | ICD-10-CM | POA: Diagnosis not present

## 2022-02-01 DIAGNOSIS — N179 Acute kidney failure, unspecified: Secondary | ICD-10-CM | POA: Diagnosis present

## 2022-02-01 DIAGNOSIS — J9601 Acute respiratory failure with hypoxia: Secondary | ICD-10-CM | POA: Diagnosis present

## 2022-02-01 DIAGNOSIS — I4892 Unspecified atrial flutter: Secondary | ICD-10-CM | POA: Diagnosis not present

## 2022-02-01 DIAGNOSIS — Z6835 Body mass index (BMI) 35.0-35.9, adult: Secondary | ICD-10-CM | POA: Diagnosis not present

## 2022-02-01 DIAGNOSIS — N1832 Chronic kidney disease, stage 3b: Secondary | ICD-10-CM | POA: Diagnosis present

## 2022-02-01 DIAGNOSIS — I130 Hypertensive heart and chronic kidney disease with heart failure and stage 1 through stage 4 chronic kidney disease, or unspecified chronic kidney disease: Secondary | ICD-10-CM | POA: Diagnosis present

## 2022-02-01 DIAGNOSIS — J449 Chronic obstructive pulmonary disease, unspecified: Secondary | ICD-10-CM | POA: Diagnosis present

## 2022-02-01 DIAGNOSIS — I4819 Other persistent atrial fibrillation: Secondary | ICD-10-CM

## 2022-02-01 DIAGNOSIS — Z79899 Other long term (current) drug therapy: Secondary | ICD-10-CM | POA: Diagnosis not present

## 2022-02-01 DIAGNOSIS — I4821 Permanent atrial fibrillation: Secondary | ICD-10-CM | POA: Diagnosis present

## 2022-02-01 DIAGNOSIS — I714 Abdominal aortic aneurysm, without rupture, unspecified: Secondary | ICD-10-CM | POA: Diagnosis present

## 2022-02-01 DIAGNOSIS — Z87891 Personal history of nicotine dependence: Secondary | ICD-10-CM | POA: Diagnosis not present

## 2022-02-01 DIAGNOSIS — I7121 Aneurysm of the ascending aorta, without rupture: Secondary | ICD-10-CM | POA: Diagnosis present

## 2022-02-01 DIAGNOSIS — D6959 Other secondary thrombocytopenia: Secondary | ICD-10-CM | POA: Diagnosis present

## 2022-02-01 DIAGNOSIS — I5043 Acute on chronic combined systolic (congestive) and diastolic (congestive) heart failure: Secondary | ICD-10-CM

## 2022-02-01 DIAGNOSIS — E785 Hyperlipidemia, unspecified: Secondary | ICD-10-CM | POA: Diagnosis present

## 2022-02-01 DIAGNOSIS — Z23 Encounter for immunization: Secondary | ICD-10-CM | POA: Diagnosis not present

## 2022-02-01 LAB — CBC
HCT: 38.8 % — ABNORMAL LOW (ref 39.0–52.0)
Hemoglobin: 12.9 g/dL — ABNORMAL LOW (ref 13.0–17.0)
MCH: 30.9 pg (ref 26.0–34.0)
MCHC: 33.2 g/dL (ref 30.0–36.0)
MCV: 92.8 fL (ref 80.0–100.0)
Platelets: 114 10*3/uL — ABNORMAL LOW (ref 150–400)
RBC: 4.18 MIL/uL — ABNORMAL LOW (ref 4.22–5.81)
RDW: 13.6 % (ref 11.5–15.5)
WBC: 5.6 10*3/uL (ref 4.0–10.5)
nRBC: 0 % (ref 0.0–0.2)

## 2022-02-01 LAB — BASIC METABOLIC PANEL
Anion gap: 13 (ref 5–15)
BUN: 45 mg/dL — ABNORMAL HIGH (ref 8–23)
CO2: 25 mmol/L (ref 22–32)
Calcium: 9.4 mg/dL (ref 8.9–10.3)
Chloride: 105 mmol/L (ref 98–111)
Creatinine, Ser: 1.83 mg/dL — ABNORMAL HIGH (ref 0.61–1.24)
GFR, Estimated: 38 mL/min — ABNORMAL LOW (ref >60)
Glucose, Bld: 119 mg/dL — ABNORMAL HIGH (ref 70–99)
Potassium: 4.3 mmol/L (ref 3.5–5.1)
Sodium: 143 mmol/L (ref 135–145)

## 2022-02-01 LAB — PROTIME-INR
INR: 2.1 — ABNORMAL HIGH (ref 0.8–1.2)
Prothrombin Time: 23.7 seconds — ABNORMAL HIGH (ref 11.4–15.2)

## 2022-02-01 LAB — TROPONIN I (HIGH SENSITIVITY): Troponin I (High Sensitivity): 21 ng/L — ABNORMAL HIGH (ref <18)

## 2022-02-01 LAB — MAGNESIUM: Magnesium: 2.4 mg/dL (ref 1.7–2.4)

## 2022-02-01 MED ORDER — INFLUENZA VAC A&B SA ADJ QUAD 0.5 ML IM PRSY
0.5000 mL | PREFILLED_SYRINGE | INTRAMUSCULAR | Status: AC
  Administered 2022-02-05: 0.5 mL via INTRAMUSCULAR
  Filled 2022-02-01 (×2): qty 0.5

## 2022-02-01 MED ORDER — MOMETASONE FURO-FORMOTEROL FUM 100-5 MCG/ACT IN AERO
2.0000 | INHALATION_SPRAY | Freq: Two times a day (BID) | RESPIRATORY_TRACT | Status: DC
  Administered 2022-02-01 – 2022-02-05 (×8): 2 via RESPIRATORY_TRACT
  Filled 2022-02-01: qty 8.8

## 2022-02-01 MED ORDER — FINASTERIDE 5 MG PO TABS
2.5000 mg | ORAL_TABLET | Freq: Every day | ORAL | Status: DC
  Administered 2022-02-01 – 2022-02-05 (×5): 2.5 mg via ORAL
  Filled 2022-02-01 (×5): qty 0.5

## 2022-02-01 MED ORDER — FUROSEMIDE 10 MG/ML IJ SOLN
40.0000 mg | Freq: Two times a day (BID) | INTRAMUSCULAR | Status: DC
  Administered 2022-02-01: 40 mg via INTRAVENOUS
  Filled 2022-02-01: qty 4

## 2022-02-01 MED ORDER — ALBUTEROL SULFATE (2.5 MG/3ML) 0.083% IN NEBU
2.5000 mg | INHALATION_SOLUTION | RESPIRATORY_TRACT | Status: DC | PRN
  Administered 2022-02-01 – 2022-02-03 (×2): 2.5 mg via RESPIRATORY_TRACT
  Filled 2022-02-01 (×2): qty 3

## 2022-02-01 MED ORDER — PRAVASTATIN SODIUM 40 MG PO TABS
40.0000 mg | ORAL_TABLET | Freq: Every day | ORAL | Status: DC
  Administered 2022-02-01 – 2022-02-04 (×4): 40 mg via ORAL
  Filled 2022-02-01 (×4): qty 1

## 2022-02-01 MED ORDER — PANTOPRAZOLE SODIUM 40 MG PO TBEC
40.0000 mg | DELAYED_RELEASE_TABLET | Freq: Every day | ORAL | Status: DC
  Administered 2022-02-01 – 2022-02-05 (×5): 40 mg via ORAL
  Filled 2022-02-01 (×5): qty 1

## 2022-02-01 MED ORDER — PHENOBARBITAL 32.4 MG PO TABS
64.8000 mg | ORAL_TABLET | Freq: Two times a day (BID) | ORAL | Status: DC
  Administered 2022-02-01 – 2022-02-05 (×8): 64.8 mg via ORAL
  Filled 2022-02-01 (×3): qty 2
  Filled 2022-02-01: qty 1
  Filled 2022-02-01 (×5): qty 2

## 2022-02-01 MED ORDER — SODIUM CHLORIDE 0.9 % IV SOLN
3.0000 g | Freq: Three times a day (TID) | INTRAVENOUS | Status: DC
  Administered 2022-02-01 – 2022-02-03 (×7): 3 g via INTRAVENOUS
  Filled 2022-02-01 (×7): qty 8

## 2022-02-01 MED ORDER — WARFARIN - PHARMACIST DOSING INPATIENT: Freq: Every day | Status: DC

## 2022-02-01 MED ORDER — UMECLIDINIUM BROMIDE 62.5 MCG/ACT IN AEPB
1.0000 | INHALATION_SPRAY | Freq: Every day | RESPIRATORY_TRACT | Status: DC
  Administered 2022-02-03 – 2022-02-05 (×3): 1 via RESPIRATORY_TRACT
  Filled 2022-02-01 (×2): qty 7

## 2022-02-01 MED ORDER — WARFARIN SODIUM 2 MG PO TABS
3.0000 mg | ORAL_TABLET | Freq: Once | ORAL | Status: AC
  Administered 2022-02-01: 3 mg via ORAL
  Filled 2022-02-01 (×2): qty 1

## 2022-02-01 MED ORDER — ACETAMINOPHEN 325 MG PO TABS: 650.0000 mg | ORAL_TABLET | Freq: Four times a day (QID) | ORAL | Status: DC | PRN

## 2022-02-01 MED ORDER — OXYCODONE HCL 5 MG PO TABS
5.0000 mg | ORAL_TABLET | ORAL | Status: DC | PRN
  Administered 2022-02-04: 5 mg via ORAL
  Filled 2022-02-01: qty 1

## 2022-02-01 MED ORDER — SODIUM CHLORIDE 0.9% FLUSH
3.0000 mL | Freq: Two times a day (BID) | INTRAVENOUS | Status: DC
  Administered 2022-02-01 – 2022-02-05 (×6): 3 mL via INTRAVENOUS

## 2022-02-01 MED ORDER — ACETAMINOPHEN 650 MG RE SUPP: 650.0000 mg | Freq: Four times a day (QID) | RECTAL | Status: DC | PRN

## 2022-02-01 MED ORDER — ENSURE ENLIVE PO LIQD
237.0000 mL | Freq: Two times a day (BID) | ORAL | Status: DC
  Administered 2022-02-02 – 2022-02-05 (×3): 237 mL via ORAL

## 2022-02-01 NOTE — Progress Notes (Signed)
ANTICOAGULATION AND ANTIBIOTIC CONSULT NOTE - Initial Consult  Pharmacy Consult for Warfarin and Unasyn Indication: atrial fibrillation and dental infection  Allergies  Allergen Reactions   Calcium Channel Blockers Other (See Comments)    Came to hospital in 1995-caused chest pain    Jardiance [Empagliflozin] Other (See Comments)    reacted with phenobarbital (caused UTI)    Patient Measurements:    Vital Signs: Temp: 99 F (37.2 C) (09/30 2325) Temp Source: Oral (09/30 1926) BP: 91/68 (09/30 2215) Pulse Rate: 126 (09/30 2215)  Labs: Recent Labs    01/31/22 1949 01/31/22 2337  HGB 12.8*  --   HCT 38.7*  --   PLT 126*  --   LABPROT  --  23.7*  INR  --  2.1*  CREATININE 1.92*  --   TROPONINIHS 23*  --     Estimated Creatinine Clearance: 36.2 mL/min (A) (by C-G formula based on SCr of 1.92 mg/dL (H)).   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 Sherwood Gambler   Nocturia    Obesity    Pansinusitis    a.  complicated by brain abscess and bleeding requiring craniotomy in 1995.   PONV (postoperative nausea and vomiting)    Presence of permanent cardiac pacemaker    Sinusitis    s/p ethmoidectomy and nasal septoplasty    Vertigo    intermitantly    Medications:  Scheduled:   amiodarone  150 mg Intravenous Once   furosemide  40 mg Intravenous Q12H   pantoprazole  40 mg Oral Daily   pravastatin  40 mg Oral q1800   sodium chloride flush  3 mL Intravenous Q12H    Assessment: 76 years of age male with history of atrial fibrillation who presents with Afib with RVR and dental infection. Pharmacy consulted for warfarin and Unasyn.   INR 2.1 on admission. Hgb/Hct down slightly at 12.8/38.7. Platelets 126.  Home warfarin regimen: 4.5 mg on Saturday and 3 mg all other days.  Patient's last dose Saturday morning - 4.5 mg dose.   WBC within normal limits. Tmax 99. Has been on Amoxicillin outpatient for dental infection. SCr elevated at 1.92 with current CrCl ~36 mL/min.   Goal of Therapy:  INR 2-3 Monitor platelets by anticoagulation protocol: Yes   Plan:  Warfarin 3 mg po x 1 tonight at 1600.  Daily PT/INR  Unasyn 3 g IV every 8 hours.  Monitor clinical status, culture results, and renal function.   Sloan Leiter, PharmD, BCPS, BCCCP Clinical Pharmacist Please refer to [facility] for [facility] numbers 02/01/2022,12:18 AM

## 2022-02-01 NOTE — Progress Notes (Signed)
PROGRESS NOTE    Ricky Hayden  NLZ:[id number] DOB: Jan 27, 1946 DOA: 01/31/2022 PCP: [facility]  Outpatient Specialists:     Brief Narrative:  As per H&P done on admission: " Ricky Hayden is a pleasant 76 y.o. male with medical history significant for chronic systolic CHF with CRT-P, COPD, CKD 3B, and atrial fibrillation on warfarin who presents to the emergency department with fatigue and shortness of breath.  He also reports worsening bilateral lower extremity swelling.     Symptoms have been present for at least a week and he denies any associated fever, productive cough, or chest pain.  He is currently on antibiotics for dental infection and reports that he is being planned for upcoming tooth extraction.  He is also planned for TEE-DC.    ED Course: Upon arrival to the ED, patient is found to be afebrile and saturating upper 80s on room air with mild tachypnea, heart rate in the 120s to 902I, and systolic blood pressure as low as 83.  EKG features atrial fibrillation with rate 122 and LBBB.  CT of the chest/abdomen/pelvis is negative for acute findings.  Chemistry panel features a BUN of 50 and creatinine 1.92.  CBC notable for mild normocytic anemia and thrombocytopenia.  INR is 2.1.  Troponin slightly elevated and flat.  BNP is increased to 1145.   \Cardiology was consulted by the ED physician and the patient was treated with 500 mL of LR, 40 mg IV Lasix, 150 mg of amiodarone, and started on amiodarone infusion.  He was also given Unasyn in the ED".  02/01/2022: Admitted with permanent atrial fibrillation with rapid ventricular response.  Patient is also noted to be in CHF exacerbation.  Cardiology input is appreciated.  Patient is currently being diuresed.  Heart rate is being controlled.  For likely cardioversion after CHF is optimized (as per the cardiology team).  Patient has had an remittent wheezing as per collateral information.  Nebulizer treatment has been  restarted.   Assessment & Plan:   Principal Problem:   Atrial fibrillation with RVR (HCC) Active Problems:   COPD (chronic obstructive pulmonary disease) (HCC)   Stage 3b chronic kidney disease (CKD) (HCC)   Acute on chronic systolic CHF (congestive heart failure) ([place])   Dental infection   Acute respiratory failure with hypoxia ([place])   1. Acute on chronic systolic CHF; acute hypoxic respiratory failure  - Presents with fatigue, SOB, and worsening b/l LE edema and found to be hypervolemic, likely related to rapid a fib  - EF was 30-35% in May 2023  - Given 40 mg IV Lasix in ED  - Continue diuresis with 40 mg IV Lasix q12h, monitor wt and I/Os, monitor renal function and electrolytes, hold Coreg and Entresto initially given low BP and increased creatinine   02/01/2022: Cardiology is managing.  Appreciate cardiology input.  Continue to diurese patient.   2. Atrial fibrillation with RVR  - Presents with fatigue and SOB and found to be in atrial fibrillation with rate 120s-130s  - He was started on IV amiodarone in ED  - Continue amiodarone infusion, continue warfarin, follow-up on cardiology recommendations   02/01/2022: Patient is on amiodarone.  Continue warfarin.  Cardioversion is planned when patient is optimized.   3. Dental infection  - On antibiotics, planned for upcoming tooth extraction  - Unasyn for now     4. CKD IIIb  - SCr is 1.92 on admission, up from 1.76 one week ago  - Renally-dose medications, monitor  closely while diuresing     5. COPD  - No cough or wheeze on admission  - Continue inhalers   02/01/2022: Noted to be wheezing earlier.  Continue neb treatment.   6. Ascending aortic aneurysm; AAA  - Outpatient follow-up recommended    DVT prophylaxis: Warfarin Code Status: Full code Family Communication:  Disposition Plan: Home eventually   Consultants:  Cardiology  Procedures:  For cardioversion when optimized  Antimicrobials:  IV  Unasyn   Subjective: -No new complaints. -Nursing staff reported that patient was wheezing earlier  Objective: Vitals:   02/01/22 1300 02/01/22 1325 02/01/22 1608 02/01/22 1700  BP: 102/85 103/85  104/79  Pulse: (!) 127 (!) 126 (!) 127 (!) 129  Resp: (!) 27 (!) 22 (!) 22 20  Temp:  97.6 F (36.4 C)  (!) 100.5 F (38.1 C)  TempSrc:  Oral  Axillary  SpO2: 95% 97%    Weight:  100 kg    Height:  5\' 7"  (1.702 m)      Intake/Output Summary (Last 24 hours) at 02/01/2022 1833 Last data filed at 02/01/2022 1700 Gross per 24 hour  Intake 824.47 ml  Output 500 ml  Net 324.47 ml   Filed Weights   02/01/22 1325  Weight: 100 kg    Examination:  General exam: Appears calm and comfortable.  Patient is obese. Respiratory system: Decreased air entry globally. Cardiovascular system: S1 & S2, irregularly irregular.   Gastrointestinal system: Abdomen is obese, soft and nontender.   Central nervous system: Awake and alert.  Patient moves all extremities.   Extremities: Fullness of the ankle with mild edema.  Data Reviewed: I have personally reviewed following labs and imaging studies  CBC: Recent Labs  Lab 01/26/22 1356 01/31/22 1949 02/01/22 0453  WBC 5.7 6.8 5.6  HGB 14.3 12.8* 12.9*  HCT 43.9 38.7* 38.8*  MCV 93.0 92.4 92.8  PLT 146* 126* 196*   Basic Metabolic Panel: Recent Labs  Lab 01/26/22 1356 01/31/22 1949 02/01/22 0453  NA 138 137 143  K 4.4 4.2 4.3  CL 106 104 105  CO2 22 23 25   GLUCOSE 91 97 119*  BUN 36* 50* 45*  CREATININE 1.76* 1.92* 1.83*  CALCIUM 9.1 9.0 9.4  MG  --   --  2.4   GFR: Estimated Creatinine Clearance: 38.1 mL/min (A) (by C-G formula based on SCr of 1.83 mg/dL (H)). Liver Function Tests: Recent Labs  Lab 01/31/22 1949  AST 19  ALT 20  ALKPHOS 55  BILITOT 0.7  PROT 7.0  ALBUMIN 3.7   No results for input(s): "LIPASE", "AMYLASE" in the last 168 hours. No results for input(s): "AMMONIA" in the last 168 hours. Coagulation  Profile: Recent Labs  Lab 01/26/22 1356 01/29/22 0000 01/31/22 2337  INR 1.9* 2.2 2.1*   Cardiac Enzymes: No results for input(s): "CKTOTAL", "CKMB", "CKMBINDEX", "TROPONINI" in the last 168 hours. BNP (last 3 results) No results for input(s): "PROBNP" in the last 8760 hours. HbA1C: No results for input(s): "HGBA1C" in the last 72 hours. CBG: No results for input(s): "GLUCAP" in the last 168 hours. Lipid Profile: No results for input(s): "CHOL", "HDL", "LDLCALC", "TRIG", "CHOLHDL", "LDLDIRECT" in the last 72 hours. Thyroid Function Tests: No results for input(s): "TSH", "T4TOTAL", "FREET4", "T3FREE", "THYROIDAB" in the last 72 hours. Anemia Panel: No results for input(s): "VITAMINB12", "FOLATE", "FERRITIN", "TIBC", "IRON", "RETICCTPCT" in the last 72 hours. Urine analysis:    Component Value Date/Time   COLORURINE YELLOW 10/27/2021 1726   APPEARANCEUR  CLOUDY (A) 10/27/2021 1726   APPEARANCEUR Clear 04/01/2015 1427   LABSPEC 1.008 10/27/2021 1726   PHURINE 6.0 10/27/2021 1726   GLUCOSEU >=500 (A) 10/27/2021 1726   GLUCOSEU NEG mg/dL 01/10/2008 2127   HGBUR LARGE (A) 10/27/2021 1726   BILIRUBINUR NEGATIVE 10/27/2021 1726   BILIRUBINUR negative 08/23/2021 1421   BILIRUBINUR Negative 04/01/2015 1427   KETONESUR NEGATIVE 10/27/2021 1726   PROTEINUR 100 (A) 10/27/2021 1726   UROBILINOGEN 0.2 08/23/2021 1421   UROBILINOGEN 1 10/05/2012 1500   NITRITE NEGATIVE 10/27/2021 1726   LEUKOCYTESUR LARGE (A) 10/27/2021 1726   Sepsis Labs: @LABRCNTIP (procalcitonin:4,lacticidven:4)  ) Recent Results (from the past 240 hour(s))  Resp Panel by RT-PCR (Flu A&B, Covid) Anterior Nasal Swab     Status: None   Collection Time: 01/31/22  9:00 PM   Specimen: Anterior Nasal Swab  Result Value Ref Range Status   SARS Coronavirus 2 by RT PCR NEGATIVE NEGATIVE Final    Comment: (NOTE) SARS-CoV-2 target nucleic acids are NOT DETECTED.  The SARS-CoV-2 RNA is generally detectable in upper  respiratory specimens during the acute phase of infection. The lowest concentration of SARS-CoV-2 viral copies this assay can detect is 138 copies/mL. A negative result does not preclude SARS-Cov-2 infection and should not be used as the sole basis for treatment or other patient management decisions. A negative result may occur with  improper specimen collection/handling, submission of specimen other than nasopharyngeal swab, presence of viral mutation(s) within the areas targeted by this assay, and inadequate number of viral copies(<138 copies/mL). A negative result must be combined with clinical observations, patient history, and epidemiological information. The expected result is Negative.  Fact Sheet for Patients:  EntrepreneurPulse.com.au  Fact Sheet for Healthcare Providers:  IncredibleEmployment.be  This test is no t yet approved or cleared by the [place] FDA and  has been authorized for detection and/or diagnosis of SARS-CoV-2 by FDA under an Emergency Use Authorization (EUA). This EUA will remain  in effect (meaning this test can be used) for the duration of the COVID-19 declaration under Section 564(b)(1) of the Act, 21 U.S.C.section 360bbb-3(b)(1), unless the authorization is terminated  or revoked sooner.       Influenza A by PCR NEGATIVE NEGATIVE Final   Influenza B by PCR NEGATIVE NEGATIVE Final    Comment: (NOTE) The Xpert Xpress SARS-CoV-2/FLU/RSV plus assay is intended as an aid in the diagnosis of influenza from Nasopharyngeal swab specimens and should not be used as a sole basis for treatment. Nasal washings and aspirates are unacceptable for Xpert Xpress SARS-CoV-2/FLU/RSV testing.  Fact Sheet for Patients: EntrepreneurPulse.com.au  Fact Sheet for Healthcare Providers: IncredibleEmployment.be  This test is not yet approved or cleared by the [place] FDA and has been  authorized for detection and/or diagnosis of SARS-CoV-2 by FDA under an Emergency Use Authorization (EUA). This EUA will remain in effect (meaning this test can be used) for the duration of the COVID-19 declaration under Section 564(b)(1) of the Act, 21 U.S.C. section 360bbb-3(b)(1), unless the authorization is terminated or revoked.  Performed at [facility], [place] [address], [place], [place] [address]          Radiology Studies: CT CHEST ABDOMEN PELVIS W CONTRAST  Result Date: 01/31/2022 CLINICAL DATA:  Sepsis.  Atrial fibrillation with weakness. EXAM: CT CHEST, ABDOMEN, AND PELVIS WITH CONTRAST TECHNIQUE: Multidetector CT imaging of the chest, abdomen and pelvis was performed following the standard protocol during bolus administration of intravenous contrast. RADIATION DOSE REDUCTION: This exam  was performed according to the departmental dose-optimization program which includes automated exposure control, adjustment of the mA and/or kV according to patient size and/or use of iterative reconstruction technique. CONTRAST:  65mL OMNIPAQUE IOHEXOL 350 MG/ML SOLN COMPARISON:  12/11/2021. FINDINGS: CT CHEST FINDINGS Cardiovascular: The heart is enlarged and there is no pericardial effusion. Pacemaker leads are present in the heart. There is atherosclerotic calcification of the aorta with mild aneurysmal dilatation of the ascending aorta measuring 4.1 cm. The pulmonary trunk is distended suggesting underlying pulmonary artery hypertension. Mediastinum/Nodes: No mediastinal, hilar, or axillary lymphadenopathy. The thyroid gland, trachea, and esophagus are within normal limits. Lungs/Pleura: Apical pleural scarring is noted bilaterally. Mild paraseptal and centrilobular emphysematous changes are present in the lungs. Mild atelectasis is noted bilaterally. No effusion or pneumothorax. Musculoskeletal: Pacemaker device is present in the anterior left chest. Degenerative changes are noted in the  thoracic spine. A mild compression deformity is noted in the superior endplate at T3. There is bony deformity of the sternum with sclerosis, which is likely chronic. CT ABDOMEN PELVIS FINDINGS Hepatobiliary: No focal liver abnormality is seen. Mild hepatic steatosis. No gallstones, gallbladder wall thickening, or biliary dilatation. Pancreas: Unremarkable. No pancreatic ductal dilatation or surrounding inflammatory changes. Spleen: Normal in size without focal abnormality. Adrenals/Urinary Tract: The adrenal glands are within normal limits. Kidneys enhance symmetrically. Multiple renal cysts are noted bilaterally. Additional subcentimeter hypodensities are present in the kidneys which are too small to further characterize. There is a complex exophytic hypodensity in the lower pole the left kidney measuring 1.2 cm in the lower pole of the left kidney, previously characterized as hemorrhagic or proteinaceous cyst. A stable 1.2 cm exophytic hyperdense lesion in the lower pole of the left kidney. There is a nonobstructive calculus in the lower pole the left kidney. No hydroureteronephrosis bilaterally. The bladder is unremarkable. Stomach/Bowel: Stomach is within normal limits. Appendix appears normal. No evidence of bowel wall thickening, distention, or inflammatory changes. No free air or pneumatosis. Scattered diverticula are present along the colon without evidence of diverticulitis. Vascular/Lymphatic: Aortic atherosclerosis with aneurysmal dilatation of the infrarenal abdominal aorta measuring 3.1 cm. No enlarged abdominal or pelvic lymph nodes. Reproductive: Prostate is unremarkable. Other: No abdominopelvic ascites. There is diastasis of the rectus abdominus with a umbilical hernia containing fat and nonobstructed bowel. Musculoskeletal: Degenerative changes in the lumbar spine. No acute osseous abnormality. IMPRESSION: 1. No acute process in the chest, abdomen, or pelvis. 2. Aortic atherosclerosis with  aneurysmal dilatation of the ascending aorta measuring 4.1 cm. Recommend annual imaging followup by CTA or MRA. This recommendation follows 2010 ACCF/AHA/AATS/ACR/ASA/SCA/SCAI/SIR/STS/SVM Guidelines for the Diagnosis and Management of Patients with Thoracic Aortic Disease. Circulation. 2010; 121: F818-E993. Aortic aneurysm NOS (ICD10-I71.9) 3. Aneurysmal dilatation of the infrarenal abdominal aorta measuring 3.1 cm. Recommend follow-up ultrasound every 3 years. 4. Dilatation of the pulmonary trunk suggesting underlying pulmonary artery hypertension. 5. Remaining incidental findings as described above. Electronically Signed   By: Brett Fairy M.D.   On: 01/31/2022 23:38   DG Chest Portable 1 View  Result Date: 01/31/2022 CLINICAL DATA:  dyspnea EXAM: PORTABLE CHEST 1 VIEW COMPARISON:  November 02, 2021 FINDINGS: The cardiomediastinal silhouette is unchanged enlarged in contour.LEFT chest cardiac pacing device. Biapical pleuroparenchymal scarring. No pleural effusion. No pneumothorax. Questionable nodular opacity in the LEFT lung, likely prominent vascular shadow. Visualized abdomen is unremarkable. IMPRESSION: Similar appearance of cardiomegaly. There is a questionable nodular opacity in the LEFT perihilar lung, likely a prominent vascular shadow. Recommend repeat PA and lateral  chest radiograph for improved evaluation versus CT chest with contrast. Electronically Signed   By: Valentino Saxon M.D.   On: 01/31/2022 20:38        Scheduled Meds:  [START ON 02/02/2022] feeding supplement  237 mL Oral BID BM   finasteride  2.5 mg Oral Daily   furosemide  40 mg Intravenous BID   [START ON 02/02/2022] influenza vaccine adjuvanted  0.5 mL Intramuscular Tomorrow-1000   mometasone-formoterol  2 puff Inhalation BID   pantoprazole  40 mg Oral Daily   PHENobarbital  64.8 mg Oral BID   pravastatin  40 mg Oral q1800   sodium chloride flush  3 mL Intravenous Q12H   umeclidinium bromide  1 puff Inhalation Daily    Warfarin - Pharmacist Dosing Inpatient   Does not apply q1600   Continuous Infusions:  amiodarone 30 mg/hr (02/01/22 1140)   ampicillin-sulbactam (UNASYN) IV 3 g (02/01/22 1549)     LOS: 0 days    Time spent: 55 minutes    Dana Allan, MD  [facility] Pager #: [phone] 7PM-7AM contact night coverage as above

## 2022-02-01 NOTE — H&P (Signed)
History and Physical    Ricky Hayden UVO:[id number] DOB: 02/12/1946 DOA: 01/31/2022  PCP: [facility]   Patient coming from: Home   Chief Complaint: Fatigue, SOB   HPI: Ricky Hayden is a pleasant 76 y.o. male with medical history significant for chronic systolic CHF with CRT-P, COPD, CKD 3B, and atrial fibrillation on warfarin who presents to the emergency department with fatigue and shortness of breath.  He also reports worsening bilateral lower extremity swelling.    Symptoms have been present for at least a week and he denies any associated fever, productive cough, or chest pain.  He is currently on antibiotics for dental infection and reports that he is being planned for upcoming tooth extraction.  He is also planned for TEE-DC.   ED Course: Upon arrival to the ED, patient is found to be afebrile and saturating upper 80s on room air with mild tachypnea, heart rate in the 120s to 742V, and systolic blood pressure as low as 83.  EKG features atrial fibrillation with rate 122 and LBBB.  CT of the chest/abdomen/pelvis is negative for acute findings.  Chemistry panel features a BUN of 50 and creatinine 1.92.  CBC notable for mild normocytic anemia and thrombocytopenia.  INR is 2.1.  Troponin slightly elevated and flat.  BNP is increased to 1145.  \Cardiology was consulted by the ED physician and the patient was treated with 500 mL of LR, 40 mg IV Lasix, 150 mg of amiodarone, and started on amiodarone infusion.  He was also given Unasyn in the ED.  Review of Systems:  All other systems reviewed and apart from HPI, are negative.  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 Sherwood Gambler   Nocturia    Obesity    Pansinusitis    a.  complicated by brain abscess and bleeding requiring craniotomy in 1995.   PONV (postoperative nausea and vomiting)    Presence of permanent cardiac pacemaker    Sinusitis    s/p ethmoidectomy and nasal septoplasty   Vertigo    intermitantly    Past Surgical History:  Procedure Laterality Date   BIV UPGRADE N/A 01/29/2020   Procedure: BIV UPGRADE;  Surgeon: Evans Lance, MD;  Location: [facility];  Service: Cardiovascular;  Laterality: N/A;   CARDIOVERSION N/A 09/26/2021   Procedure: CARDIOVERSION;  Surgeon: [name], MD;  Location: [facility];  Service: Cardiovascular;  Laterality: N/A;   CIRCUMCISION N/A 11/20/2013   Procedure: CIRCUMCISION ADULT;  Surgeon: Claybon Jabs, MD;  Location: [facility];  Service: Urology;  Laterality: N/A;   CRANIOTOMY  1995   hematomy due to sinus infection    CYSTOSCOPY/URETEROSCOPY/HOLMIUM LASER/STENT PLACEMENT Left 12/23/2021   Procedure: LEFT URETEROSCOPY/HOLMIUM LASER LITHOTRIPSY/STENT PLACEMENT retrograde;  Surgeon: Vira Agar, MD;  Location: [facility];  Service: Urology;  Laterality: Left;  90 MINUTES NEEDED FOR CASE   PACEMAKER IMPLANT N/A 01/17/2020   Procedure: PACEMAKER IMPLANT;  Surgeon: Deboraha Sprang, MD;  Location: [facility];  Service: Cardiovascular;  Laterality: N/A;   RIGHT/LEFT HEART CATH AND CORONARY ANGIOGRAPHY N/A 09/20/2019   Procedure: RIGHT/LEFT HEART CATH AND CORONARY ANGIOGRAPHY;  Surgeon: Jolaine Artist, MD;   Location: [facility];  Service: Cardiovascular;  Laterality: N/A;   shoulder surg rt   1995   SINUS SURGERY WITH INSTATRAK     ethmoidectomy and nasal septum repair   TEE WITHOUT CARDIOVERSION N/A 09/26/2021   Procedure: TRANSESOPHAGEAL ECHOCARDIOGRAM (TEE);  Surgeon: [name], MD;  Location: [facility];  Service: Cardiovascular;  Laterality: N/A;    Social History:   reports that he quit smoking about 35 years ago. His smoking use included cigarettes. He has never used smokeless tobacco. He reports that he does not drink alcohol and does not use drugs.  Allergies  Allergen Reactions   Calcium Channel Blockers Other (See Comments)    Came to hospital in 1995-caused chest pain    Jardiance [Empagliflozin] Other (See Comments)    reacted with phenobarbital (caused UTI)    Family History  Problem Relation Age of Onset   Heart failure Mother 23   Heart attack Father 16     Prior to Admission medications   Medication Sig Start Date End Date Taking? Authorizing Provider  acetaminophen (TYLENOL) 500 MG tablet Take 1,000 mg by mouth every 6 (six) hours as needed for moderate pain.    [provider]  albuterol (PROVENTIL HFA;VENTOLIN HFA) 108 (90 BASE) MCG/ACT inhaler Inhale 2 puffs into the lungs every 6 (six) hours as needed for wheezing. 12/10/14   Maryellen Pile, MD  amiodarone (PACERONE) 200 MG tablet Take one tablet by mouth in the evening 01/26/22   Fenton, Clint R, PA  amoxicillin (AMOXIL) 500 MG capsule Take 500 mg by mouth 3 (three) times daily. 01/24/22   [provider]  carvedilol (COREG) 12.5 MG tablet Take 0.5 tablets (6.25 mg total) by mouth 2 (two) times daily with a meal. 01/26/22   Fenton, Clint R, PA  finasteride (PROSCAR) 5 MG tablet TAKE 1/2 TABLET BY MOUTH DAILY 11/28/21   Jennye Boroughs, MD  fluticasone furoate-vilanterol (BREO ELLIPTA) 200-25 MCG/ACT AEPB Inhale 1 puff into the lungs daily. Patient not taking: Reported on  01/29/2022 10/10/21   Love, Ivan Anchors, PA-C  fluticasone-salmeterol [facility] INHUB) 250-50 MCG/ACT AEPB Inhale 1 puff into the lungs in the morning and at bedtime.    [provider]  Melatonin 10 MG CAPS Take 10 mg by mouth at bedtime as needed (sleep).    [provider]  Multiple Vitamins-Minerals (PRESERVISION AREDS 2 PO) Take 1 tablet by mouth in the morning and at bedtime.    [provider]  pantoprazole (PROTONIX) 40 MG tablet Take 1 tablet (40 mg total) by mouth daily. 10/10/21   Love, Ivan Anchors, PA-C  PHENobarbital (LUMINAL) 64.8 MG tablet Take 1 tablet (64.8 mg total) by mouth 2 (two) times daily. 07/24/15   Corky Sox, MD  polyethylene glycol (MIRALAX / GLYCOLAX) 17 g packet Take 17 g by mouth 2 (two) times daily. Patient taking differently: Take 17 g  by mouth daily as needed for moderate constipation. 10/10/21   Love, Ivan Anchors, PA-C  pravastatin (PRAVACHOL) 40 MG tablet TAKE 1 TABLET BY MOUTH EVERY DAY IN THE EVENING 11/28/21   Jennye Boroughs, MD  sacubitril-valsartan (ENTRESTO) 24-26 MG Take 1 tablet by mouth 2 (two) times daily. 10/10/21   Love, Ivan Anchors, PA-C  spironolactone (ALDACTONE) 25 MG tablet Take 0.5 tablets (12.5 mg total) by mouth daily. 10/20/21   Leonie Man, MD  tamsulosin (FLOMAX) 0.4 MG CAPS capsule Take 1 capsule (0.4 mg total) by mouth daily after supper. Patient not taking: Reported on 01/26/2022 12/23/21   Vira Agar, MD  Tiotropium Bromide Monohydrate (SPIRIVA RESPIMAT) 2.5 MCG/ACT AERS Inhale 2 each into the lungs every evening.    [provider]  torsemide (DEMADEX) 20 MG tablet Take 2 tablets (40 mg total) by mouth daily. MAy take an additional 20 mg  or 40 mg in the afternoon if 3 lbs or 5 lbs overnight weight gain. Patient taking differently: Take 40 mg by mouth in the morning. 12/09/21   Deberah Pelton, NP  umeclidinium bromide (INCRUSE ELLIPTA) 62.5 MCG/ACT AEPB Inhale 1 puff into the lungs daily. Patient not taking:  Reported on 01/29/2022 10/10/21   Bary Leriche, PA-C  Vitamin D3 (VITAMIN D) 25 MCG tablet Take 1 tablet (1,000 Units total) by mouth daily. 10/10/21   Love, Ivan Anchors, PA-C  warfarin (COUMADIN) 3 MG tablet Take 1 tablet (3 mg total) by mouth daily at 4 PM. Patient taking differently: Take 1.5-3 mg by mouth See admin instructions. 12/09/21   Deberah Pelton, NP    Physical Exam: Vitals:   01/31/22 2000 01/31/22 2145 01/31/22 2215 01/31/22 2325  BP: 95/80 93/64 91/68    Pulse: (!) 130 (!) 125 (!) 126   Resp: (!) 23 (!) 21 (!) 22   Temp:    99 F (37.2 C)  TempSrc:      SpO2: 96% (!) 87% (!) 87%     Constitutional: NAD, calm  Eyes: PERTLA, lids and conjunctivae normal ENMT: Mucous membranes are moist. Posterior pharynx clear of any exudate or lesions.   Neck: supple, no masses  Respiratory: Dyspneic with speech, no wheezing. No accessory muscle use.  Cardiovascular: S1 & S2 heard, regular rate and rhythm. Bilateral LE pitting edema.   Abdomen: No distension, no tenderness, soft. Bowel sounds active.  Musculoskeletal: no clubbing / cyanosis. No joint deformity upper and lower extremities.   Skin: no significant rashes, lesions, ulcers. Warm, dry, well-perfused. Neurologic: CN 2-12 grossly intact. Moving all extremities. Alert and oriented.  Psychiatric: Pleasant. Cooperative.    Labs and Imaging on Admission: I have personally reviewed following labs and imaging studies  CBC: Recent Labs  Lab 01/26/22 1356 01/31/22 1949  WBC 5.7 6.8  HGB 14.3 12.8*  HCT 43.9 38.7*  MCV 93.0 92.4  PLT 146* 884*   Basic Metabolic Panel: Recent Labs  Lab 01/26/22 1356 01/31/22 1949  NA 138 137  K 4.4 4.2  CL 106 104  CO2 22 23  GLUCOSE 91 97  BUN 36* 50*  CREATININE 1.76* 1.92*  CALCIUM 9.1 9.0   GFR: Estimated Creatinine Clearance: 36.2 mL/min (A) (by C-G formula based on SCr of 1.92 mg/dL (H)). Liver Function Tests: Recent Labs  Lab 01/31/22 1949  AST 19  ALT 20  ALKPHOS 55   BILITOT 0.7  PROT 7.0  ALBUMIN 3.7   No results for input(s): "LIPASE", "AMYLASE" in the last 168 hours.  No results for input(s): "AMMONIA" in the last 168 hours. Coagulation Profile: Recent Labs  Lab 01/26/22 1356 01/29/22 0000 01/31/22 2337  INR 1.9* 2.2 2.1*   Cardiac Enzymes: No results for input(s): "CKTOTAL", "CKMB", "CKMBINDEX", "TROPONINI" in the last 168 hours. BNP (last 3 results) No results for input(s): "PROBNP" in the last 8760 hours. HbA1C: No results for input(s): "HGBA1C" in the last 72 hours. CBG: No results for input(s): "GLUCAP" in the last 168 hours. Lipid Profile: No results for input(s): "CHOL", "HDL", "LDLCALC", "TRIG", "CHOLHDL", "LDLDIRECT" in the last 72 hours. Thyroid Function Tests: No results for input(s): "TSH", "T4TOTAL", "FREET4", "T3FREE", "THYROIDAB" in the last 72 hours. Anemia Panel: No results for input(s): "VITAMINB12", "FOLATE", "FERRITIN", "TIBC", "IRON", "RETICCTPCT" in the last 72 hours. Urine analysis:    Component Value Date/Time   COLORURINE YELLOW 10/27/2021 1726   APPEARANCEUR CLOUDY (A) 10/27/2021 1726   APPEARANCEUR Clear 04/01/2015 1427   LABSPEC 1.008 10/27/2021 1726   PHURINE 6.0 10/27/2021 1726   GLUCOSEU >=500 (A) 10/27/2021 1726   GLUCOSEU NEG mg/dL 01/10/2008 2127   HGBUR LARGE (A) 10/27/2021 1726   BILIRUBINUR NEGATIVE 10/27/2021 1726   BILIRUBINUR negative 08/23/2021 1421   BILIRUBINUR Negative 04/01/2015 1427   KETONESUR NEGATIVE 10/27/2021 1726   PROTEINUR 100 (A) 10/27/2021 1726   UROBILINOGEN 0.2 08/23/2021 1421   UROBILINOGEN 1 10/05/2012 1500   NITRITE NEGATIVE 10/27/2021 1726   LEUKOCYTESUR LARGE (A) 10/27/2021 1726   Sepsis Labs: @LABRCNTIP (procalcitonin:4,lacticidven:4) ) Recent Results (from the past 240 hour(s))  Resp Panel by RT-PCR (Flu A&B, Covid) Anterior Nasal Swab     Status: None   Collection Time: 01/31/22  9:00 PM   Specimen: Anterior Nasal Swab  Result Value Ref Range Status    SARS Coronavirus 2 by RT PCR NEGATIVE NEGATIVE Final    Comment: (NOTE) SARS-CoV-2 target nucleic acids are NOT DETECTED.  The SARS-CoV-2 RNA is generally detectable in upper respiratory specimens during the acute phase of infection. The lowest concentration of SARS-CoV-2 viral copies this assay can detect is 138 copies/mL. A negative result does not preclude SARS-Cov-2 infection and should not be used as the sole basis for treatment or other patient management decisions. A negative result may occur with  improper specimen collection/handling, submission of specimen other than nasopharyngeal swab, presence of viral mutation(s) within the areas targeted by this assay, and inadequate number of viral copies(<138 copies/mL). A negative result must be combined with clinical observations, patient history, and epidemiological information. The expected result is Negative.  Fact Sheet for Patients:  EntrepreneurPulse.com.au  Fact Sheet for Healthcare Providers:  IncredibleEmployment.be  This test is no t yet approved or cleared by the [place] FDA and  has been authorized for detection and/or diagnosis of SARS-CoV-2 by FDA under an Emergency Use Authorization (EUA). This EUA will remain  in effect (meaning this test can be used) for the duration of the COVID-19 declaration under Section 564(b)(1) of the Act, 21 U.S.C.section 360bbb-3(b)(1), unless the authorization is terminated  or revoked sooner.       Influenza A by PCR NEGATIVE NEGATIVE Final   Influenza B by PCR NEGATIVE NEGATIVE Final    Comment: (NOTE) The Xpert Xpress SARS-CoV-2/FLU/RSV plus assay is intended as an aid in the diagnosis of influenza from Nasopharyngeal swab specimens and should not be used as a sole basis for treatment. Nasal washings and aspirates are unacceptable for Xpert Xpress SARS-CoV-2/FLU/RSV testing.  Fact Sheet for  Patients: EntrepreneurPulse.com.au  Fact Sheet for Healthcare Providers: IncredibleEmployment.be  This test is not yet approved or cleared by the [place] and has been authorized for detection and/or diagnosis of SARS-CoV-2 by FDA under an Emergency Use Authorization (EUA). This EUA will remain in effect (meaning this test can be used) for the duration of the COVID-19 declaration under Section 564(b)(1) of the Act, 21 U.S.C. section 360bbb-3(b)(1), unless the authorization is terminated or revoked.  Performed at [facility], [place] [address], [place], [place] [address]      Radiological Exams on Admission: CT CHEST ABDOMEN PELVIS W CONTRAST  Result Date: 01/31/2022 CLINICAL DATA:  Sepsis.  Atrial fibrillation with weakness. EXAM: CT CHEST, ABDOMEN, AND PELVIS WITH CONTRAST TECHNIQUE: Multidetector CT imaging of the chest, abdomen and pelvis was performed following the standard protocol during bolus administration of intravenous contrast. RADIATION DOSE REDUCTION: This exam was performed according to the departmental dose-optimization program which includes automated exposure control, adjustment of the mA and/or kV according to patient size and/or use of iterative reconstruction technique. CONTRAST:  29mL OMNIPAQUE IOHEXOL 350 MG/ML SOLN COMPARISON:  12/11/2021. FINDINGS: CT CHEST FINDINGS Cardiovascular: The heart is enlarged and there is no pericardial effusion. Pacemaker leads are present in the heart. There is atherosclerotic calcification of the aorta with mild aneurysmal dilatation of the ascending aorta measuring 4.1 cm. The pulmonary trunk is distended suggesting underlying pulmonary artery hypertension. Mediastinum/Nodes: No mediastinal, hilar, or axillary lymphadenopathy. The thyroid gland, trachea, and esophagus are within normal limits. Lungs/Pleura: Apical pleural scarring is noted bilaterally. Mild paraseptal and centrilobular  emphysematous changes are present in the lungs. Mild atelectasis is noted bilaterally. No effusion or pneumothorax. Musculoskeletal: Pacemaker device is present in the anterior left chest. Degenerative changes are noted in the thoracic spine. A mild compression deformity is noted in the superior endplate at T3. There is bony deformity of the sternum with sclerosis, which is likely chronic. CT ABDOMEN PELVIS FINDINGS Hepatobiliary: No focal liver abnormality is seen. Mild hepatic steatosis. No gallstones, gallbladder wall thickening, or biliary dilatation. Pancreas: Unremarkable. No pancreatic ductal dilatation or surrounding inflammatory changes. Spleen: Normal in size without focal abnormality. Adrenals/Urinary Tract: The adrenal glands are within normal limits. Kidneys enhance symmetrically. Multiple renal cysts are noted bilaterally. Additional subcentimeter hypodensities are present in the kidneys which are too small to further characterize. There is a complex exophytic hypodensity in the lower pole the left kidney measuring 1.2 cm in the lower pole of the left kidney, previously characterized as hemorrhagic or proteinaceous cyst. A stable 1.2 cm exophytic hyperdense lesion in the lower pole of the left kidney. There is a nonobstructive calculus in the lower pole the left kidney. No hydroureteronephrosis bilaterally. The bladder is unremarkable. Stomach/Bowel: Stomach is within normal limits. Appendix appears normal. No evidence of bowel wall thickening, distention, or inflammatory changes. No free air or pneumatosis. Scattered diverticula are present along the colon without evidence of diverticulitis. Vascular/Lymphatic: Aortic atherosclerosis with aneurysmal dilatation of the infrarenal abdominal aorta measuring 3.1 cm. No enlarged abdominal or pelvic lymph nodes. Reproductive: Prostate is unremarkable. Other: No abdominopelvic ascites. There is diastasis of the rectus abdominus with a umbilical hernia  containing fat and nonobstructed bowel. Musculoskeletal: Degenerative changes in the lumbar spine. No acute osseous abnormality. IMPRESSION: 1. No acute process in the chest, abdomen, or pelvis. 2. 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 Recommend annual imaging followup by CTA or MRA. This recommendation follows 2010 ACCF/AHA/AATS/ACR/ASA/SCA/SCAI/SIR/STS/SVM Guidelines for the Diagnosis and Management of Patients with Thoracic Aortic Disease.  Circulation. 2010; 121: W102-V253. Aortic aneurysm NOS (ICD10-I71.9) 3. Aneurysmal dilatation of the infrarenal abdominal aorta measuring 3.1 cm. Recommend follow-up ultrasound every 3 years. 4. Dilatation of the pulmonary trunk suggesting underlying pulmonary artery hypertension. 5. Remaining incidental findings as described above. Electronically Signed   By: Brett Fairy M.D.   On: 01/31/2022 23:38   DG Chest Portable 1 View  Result Date: 01/31/2022 CLINICAL DATA:  dyspnea EXAM: PORTABLE CHEST 1 VIEW COMPARISON:  November 02, 2021 FINDINGS: The cardiomediastinal silhouette is unchanged enlarged in contour.LEFT chest cardiac pacing device. Biapical pleuroparenchymal scarring. No pleural effusion. No pneumothorax. Questionable nodular opacity in the LEFT lung, likely prominent vascular shadow. Visualized abdomen is unremarkable. IMPRESSION: Similar appearance of cardiomegaly. There is a questionable nodular opacity in the LEFT perihilar lung, likely a prominent vascular shadow. Recommend repeat PA and lateral chest radiograph for improved evaluation versus CT chest with contrast. Electronically Signed   By: Valentino Saxon M.D.   On: 01/31/2022 20:38    EKG: Independently reviewed. Atrial fibrillation with RVR, LBBB.   Assessment/Plan   1. Acute on chronic systolic CHF; acute hypoxic respiratory failure  - Presents with fatigue, SOB, and worsening b/l LE edema and found to be hypervolemic, likely related to rapid a  fib  - EF was 30-35% in May 2023  - Given 40 mg IV Lasix in ED  - Continue diuresis with 40 mg IV Lasix q12h, monitor wt and I/Os, monitor renal function and electrolytes, hold Coreg and Entresto initially given low BP and increased creatinine    2. Atrial fibrillation with RVR  - Presents with fatigue and SOB and found to be in atrial fibrillation with rate 120s-130s  - He was started on IV amiodarone in ED  - Continue amiodarone infusion, continue warfarin, follow-up on cardiology recommendations    3. Dental infection  - On antibiotics, planned for upcoming tooth extraction  - Unasyn for now    4. CKD IIIb  - SCr is 1.92 on admission, up from 1.76 one week ago  - Renally-dose medications, monitor closely while diuresing    5. COPD  - No cough or wheeze on admission  - Continue inhalers    6. Ascending aortic aneurysm; AAA  - Outpatient follow-up recommended    DVT prophylaxis: warfarin  Code Status: Full  Level of Care: Level of care: Progressive Family Communication: Daughter at bedside  Disposition Plan:  Patient is from: Home  Anticipated d/c is to: Home  Anticipated d/c date is: 02/04/22  Patient currently: Pending diuresis, rate-control, cardiology consultation Consults called: Cardiology  Admission status: Inpatient     Vianne Bulls, MD [facility]  02/01/2022, 12:43 AM

## 2022-02-01 NOTE — Consult Note (Signed)
Cardiology Consultation   Patient ID: Ricky Hayden MRN: [id number]; DOB: [birth year]  Admit date: 01/31/2022 Date of Consult: 02/01/2022  PCP:  [facility] Cardiologist:  Glenetta Hew, MD  Electrophysiologist:  Virl Axe, MD       Patient Profile:   Ricky Hayden is a 76 y.o. 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  History of Present Illness:   [name] states progressively worsening shortness of breath for a week associated with worsening lower extremity edema.  Patient's daughter at the bedside reports patient gained approximately 7 pounds in a week. Patient also reports some abdominal fullness. He is currently on oral antibiotics for dental infection and is being planned for tooth extraction until cleared by cardiology.  He was seen by our A-fib clinic on 9/25 and arrange for TEE/DCCV as outpatient. Patient is currently on room air, still has some respiratory distress, Denies fever, chills, dizziness, syncope, lightheadedness, cough, chest discomfort, heart palpitations, nausea, vomiting, dysuria, diarrhea, or any bleeding events.  On admission WBC 6.8, hemoglobin 12.8, BNP 1144, potassium 4.2, creatinine 1.92, troponin 23>21, INR 2.1  Past Medical History:  Diagnosis Date   Arthritis    osteoarthritis of left knee   Ascending aorta dilatation (HCC)    Balanitis    recurrent   Cardiac arrhythmia    life threatening, secondary to CCB vs b- blockers   Cardiomyopathy ([place])    Chronic joint pain    Chronic systolic CHF (congestive heart failure) (HCC)    CKD (chronic kidney  disease), stage III (HCC)    COPD (chronic obstructive pulmonary disease) (HCC)    Coronary artery disease    Dilated aortic root (HCC)    Dyspnea    Enlarged prostate    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 Sherwood Gambler   Nocturia    Obesity    Pansinusitis    a.  complicated by brain abscess and bleeding requiring craniotomy in 1995.   PONV (postoperative nausea and vomiting)    Presence of permanent cardiac pacemaker    Sinusitis    s/p ethmoidectomy and nasal septoplasty   Vertigo    intermitantly    Past Surgical History:  Procedure Laterality Date   BIV UPGRADE N/A 01/29/2020   Procedure: BIV UPGRADE;  Surgeon: Evans Lance, MD;  Location: [place] [facility];  Service: Cardiovascular;  Laterality: N/A;   CARDIOVERSION N/A 09/26/2021   Procedure: CARDIOVERSION;  Surgeon: [name], MD;  Location: [facility];  Service: Cardiovascular;  Laterality: N/A;   CIRCUMCISION N/A 11/20/2013   Procedure: CIRCUMCISION ADULT;  Surgeon: Claybon Jabs, MD;  Location: [facility];  Service: Urology;  Laterality: N/A;   CRANIOTOMY  1995   hematomy due to sinus infection    CYSTOSCOPY/URETEROSCOPY/HOLMIUM LASER/STENT PLACEMENT Left 12/23/2021   Procedure: LEFT URETEROSCOPY/HOLMIUM LASER LITHOTRIPSY/STENT PLACEMENT retrograde;  Surgeon: Vira Agar, MD;  Location: [facility];  Service: Urology;  Laterality: Left;  90 MINUTES NEEDED FOR CASE   PACEMAKER IMPLANT N/A 01/17/2020   Procedure: PACEMAKER IMPLANT;  Surgeon: Deboraha Sprang, MD;  Location: [place] [facility];  Service: Cardiovascular;  Laterality: N/A;   RIGHT/LEFT HEART CATH AND CORONARY ANGIOGRAPHY N/A 09/20/2019   Procedure: RIGHT/LEFT HEART CATH AND CORONARY ANGIOGRAPHY;  Surgeon: Jolaine Artist, MD;   Location: [place] [facility];  Service: Cardiovascular;  Laterality: N/A;   shoulder surg rt   1995   SINUS SURGERY WITH INSTATRAK     ethmoidectomy and nasal septum repair   TEE WITHOUT CARDIOVERSION N/A 09/26/2021   Procedure: TRANSESOPHAGEAL ECHOCARDIOGRAM (TEE);  Surgeon: [name], MD;  Location: [facility];  Service: Cardiovascular;  Laterality: N/A;       Inpatient Medications: Scheduled Meds:  furosemide  40 mg Intravenous Q12H   pantoprazole  40 mg Oral Daily   pravastatin  40 mg Oral q1800   sodium chloride flush  3 mL Intravenous Q12H   warfarin  3 mg Oral ONCE-1600   Warfarin - Pharmacist Dosing Inpatient   Does not apply q1600   Continuous Infusions:  amiodarone 60 mg/hr (02/01/22 0033)   Followed by   amiodarone     ampicillin-sulbactam (UNASYN) IV     PRN Meds: acetaminophen **OR** acetaminophen, albuterol, oxyCODONE  Allergies:    Allergies  Allergen Reactions   Calcium Channel Blockers Other (See Comments)    Came to hospital in 1995-caused chest pain    Jardiance [Empagliflozin] Other (See Comments)    reacted with phenobarbital (caused UTI)    Social History:   Social History   Socioeconomic History   Marital status: Widowed    Spouse name: Not on file   Number of children: Not on file   Years of education: Not on file   Highest education level: Not on file  Occupational History   Not on file  Tobacco Use   Smoking status: Former    Types: Cigarettes    Quit date: 05/04/1986    Years since quitting: 35.7   Smokeless tobacco: Never  Vaping Use   Vaping Use: Never used  Substance and Sexual Activity   Alcohol use: No    Alcohol/week: 0.0 standard drinks of alcohol   Drug use: No   Sexual activity: Yes  Other Topics Concern   Not on file  Social History Narrative   Not on file   Social Determinants of Health   Financial Resource Strain: Not on file  Food Insecurity: Not on file  Transportation Needs: Not on file  Physical  Activity: Not on file  Stress: Not on file  Social Connections: Not on file  Intimate Partner Violence: Not on file    Family History:    Family History  Problem Relation Age of Onset   Heart failure Mother 3   Heart attack Father 30     ROS:  Please see the history of present illness.   All other ROS reviewed and negative.     Physical Exam/Data:   Vitals:   01/31/22 2000 01/31/22 2145 01/31/22 2215 01/31/22 2325  BP: 95/80 93/64 91/68    Pulse: (!) 130 (!) 125 (!) 126   Resp: (!) 23 (!) 21 (!) 22   Temp:    99 F (37.2 C)  TempSrc:      SpO2: 96% (!) 87% (!) 87%    No intake or output data in the 24 hours ending 02/01/22 0214    01/26/2022  2:02 PM 01/08/2022    3:17 PM 12/23/2021    8:07 AM  Last 3 Weights  Weight (lbs) 219 lb 3.2 oz 215 lb 12.8 oz 216 lb 0.8 oz  Weight (kg) 99.428 kg 97.886 kg 98 kg     There is no height or weight on file to calculate BMI.  General:  Well nourished, well developed, in no acute distress HEENT: normal Neck: JVD 9-10 cm H2O at 30 degree Vascular: No carotid bruits; Distal pulses 2+ bilaterally Cardiac:  normal S1, S2; RRR; no murmur  Lungs:  clear to auscultation bilaterally, no wheezing, rhonchi or rales  Abd: distended, soft, nontender, no hepatomegaly  Ext: 2+ pitting edema Musculoskeletal:  No deformities, BUE and BLE strength normal and equal Skin: warm and dry  Neuro:  CNs 2-12 intact, no focal abnormalities noted Psych:  Normal affect   Relevant CV Studies:   Laboratory Data:  High Sensitivity Troponin:   Recent Labs  Lab 01/31/22 1949 01/31/22 2337  TROPONINIHS 23* 21*     Chemistry Recent Labs  Lab 01/26/22 1356 01/31/22 1949  NA 138 137  K 4.4 4.2  CL 106 104  CO2 22 23  GLUCOSE 91 97  BUN 36* 50*  CREATININE 1.76* 1.92*  CALCIUM 9.1 9.0  GFRNONAA 39* 35*  ANIONGAP 10 10    Recent Labs  Lab 01/31/22 1949  PROT 7.0  ALBUMIN 3.7  AST 19  ALT 20  ALKPHOS 55  BILITOT 0.7   Lipids No  results for input(s): "CHOL", "TRIG", "HDL", "LABVLDL", "LDLCALC", "CHOLHDL" in the last 168 hours.  Hematology Recent Labs  Lab 01/26/22 1356 01/31/22 1949  WBC 5.7 6.8  RBC 4.72 4.19*  HGB 14.3 12.8*  HCT 43.9 38.7*  MCV 93.0 92.4  MCH 30.3 30.5  MCHC 32.6 33.1  RDW 12.9 13.4  PLT 146* 126*   Thyroid No results for input(s): "TSH", "FREET4" in the last 168 hours.  BNP Recent Labs  Lab 01/31/22 1949  BNP 1,144.5*    DDimer No results for input(s): "DDIMER" in the last 168 hours.   Radiology/Studies:  CT CHEST ABDOMEN PELVIS W CONTRAST  Result Date: 01/31/2022 CLINICAL DATA:  Sepsis.  Atrial fibrillation with weakness. EXAM: CT CHEST, ABDOMEN, AND PELVIS WITH CONTRAST TECHNIQUE: Multidetector CT imaging of the chest, abdomen and pelvis was performed following the standard protocol during bolus administration of intravenous contrast. RADIATION DOSE REDUCTION: This exam was performed according to the departmental dose-optimization program which includes automated exposure control, adjustment of the mA and/or kV according to patient size and/or use of iterative reconstruction technique. CONTRAST:  73mL OMNIPAQUE IOHEXOL 350 MG/ML SOLN COMPARISON:  12/11/2021. FINDINGS: CT CHEST FINDINGS Cardiovascular: The heart is enlarged and there is no pericardial effusion. Pacemaker leads are present in the heart. There is atherosclerotic calcification of the aorta with mild aneurysmal dilatation of the ascending aorta measuring 4.1 cm. The pulmonary trunk is distended suggesting underlying pulmonary artery hypertension. Mediastinum/Nodes: No mediastinal, hilar, or axillary lymphadenopathy. The thyroid gland, trachea, and esophagus are within normal limits. Lungs/Pleura: Apical pleural scarring is noted bilaterally. Mild paraseptal and centrilobular emphysematous changes are present in the lungs. Mild atelectasis is noted bilaterally. No effusion or pneumothorax. Musculoskeletal: Pacemaker device is  present in the anterior left chest. Degenerative changes are noted in the thoracic spine. A mild compression deformity is noted in the superior endplate at T3. There is bony deformity of the sternum with sclerosis, which is likely chronic. CT ABDOMEN PELVIS FINDINGS Hepatobiliary: No  focal liver abnormality is seen. Mild hepatic steatosis. No gallstones, gallbladder wall thickening, or biliary dilatation. Pancreas: Unremarkable. No pancreatic ductal dilatation or surrounding inflammatory changes. Spleen: Normal in size without focal abnormality. Adrenals/Urinary Tract: The adrenal glands are within normal limits. Kidneys enhance symmetrically. Multiple renal cysts are noted bilaterally. Additional subcentimeter hypodensities are present in the kidneys which are too small to further characterize. There is a complex exophytic hypodensity in the lower pole the left kidney measuring 1.2 cm in the lower pole of the left kidney, previously characterized as hemorrhagic or proteinaceous cyst. A stable 1.2 cm exophytic hyperdense lesion in the lower pole of the left kidney. There is a nonobstructive calculus in the lower pole the left kidney. No hydroureteronephrosis bilaterally. The bladder is unremarkable. Stomach/Bowel: Stomach is within normal limits. Appendix appears normal. No evidence of bowel wall thickening, distention, or inflammatory changes. No free air or pneumatosis. Scattered diverticula are present along the colon without evidence of diverticulitis. Vascular/Lymphatic: Aortic atherosclerosis with aneurysmal dilatation of the infrarenal abdominal aorta measuring 3.1 cm. No enlarged abdominal or pelvic lymph nodes. Reproductive: Prostate is unremarkable. Other: No abdominopelvic ascites. There is diastasis of the rectus abdominus with a umbilical hernia containing fat and nonobstructed bowel. Musculoskeletal: Degenerative changes in the lumbar spine. No acute osseous abnormality. IMPRESSION: 1. No acute process  in the chest, abdomen, or pelvis. 2. 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 Recommend annual imaging followup by CTA or MRA. This recommendation follows 2010 ACCF/AHA/AATS/ACR/ASA/SCA/SCAI/SIR/STS/SVM Guidelines for the Diagnosis and Management of Patients with Thoracic Aortic Disease. Circulation. 2010; 121: B353-G992. Aortic aneurysm NOS (ICD10-I71.9) 3. Aneurysmal dilatation of the infrarenal abdominal aorta measuring 3.1 cm. Recommend follow-up ultrasound every 3 years. 4. Dilatation of the pulmonary trunk suggesting underlying pulmonary artery hypertension. 5. Remaining incidental findings as described above. Electronically Signed   By: Brett Fairy M.D.   On: 01/31/2022 23:38   DG Chest Portable 1 View  Result Date: 01/31/2022 CLINICAL DATA:  dyspnea EXAM: PORTABLE CHEST 1 VIEW COMPARISON:  November 02, 2021 FINDINGS: The cardiomediastinal silhouette is unchanged enlarged in contour.LEFT chest cardiac pacing device. Biapical pleuroparenchymal scarring. No pleural effusion. No pneumothorax. Questionable nodular opacity in the LEFT lung, likely prominent vascular shadow. Visualized abdomen is unremarkable. IMPRESSION: Similar appearance of cardiomegaly. There is a questionable nodular opacity in the LEFT perihilar lung, likely a prominent vascular shadow. Recommend repeat PA and lateral chest radiograph for improved evaluation versus CT chest with contrast. Electronically Signed   By: Valentino Saxon M.D.   On: 01/31/2022 20:38     Assessment and Plan:   #Persistent atrial fibrillation/flutter with RVR -Continue telemetry and optimize electrolytes -Continue warfarin, maintain INR 2-3 -Continue with amiodarone -plan TEE/DCCV when volume status improves  #Acute on chronic combined HF exacerbation s/p CRT-P -strict I/O, daily weight/UOP -weight up 7lbs, continue diuresis, goal negative 2-3L/daily -continue GDMT if his BP and renal function  allow   Risk Assessment/Risk Scores:   New York Heart Association (NYHA) Functional Class NYHA Class III  CHA2DS2-VASc Score = 6   This indicates a 9.7% annual risk of stroke. The patient's score is based upon: CHF History: 1 HTN History: 1 Diabetes History: 1 Stroke History: 0 Vascular Disease History: 1 Age Score: 2 Gender Score: 0    For questions or updates, please contact [place] Please consult [email or web address] for contact info under    Signed, Laurice Record, MD  02/01/2022 2:14 AM

## 2022-02-01 NOTE — Progress Notes (Signed)
   Afib rates still in the 130's. Fellow consult note reviewed. Plan for additional diuresis. Has been scheduled for DCCV on 10/4 - will likely benefit from an earlier procedure this week once more euvolemic.  Pixie Casino, MD, [facility], [place] Director of the [facility] Lipid [facility] &  Cardiovascular Risk Reduction Clinic Diplomate of the American Board of Clinical Lipidology Attending Cardiologist  Direct Dial: [phone]  Fax: [phone]  Website:  [email or web address]

## 2022-02-01 NOTE — Progress Notes (Signed)
   02/01/22 1325  Assess: MEWS Score  Temp 97.6 F (36.4 C)  BP 103/85  MAP (mmHg) 92  Pulse Rate (!) 126  Resp (!) 22  Level of Consciousness Alert  SpO2 97 %  O2 Device Nasal Cannula  O2 Flow Rate (L/min) 3 L/min  Assess: MEWS Score  MEWS Temp 0  MEWS Systolic 0  MEWS Pulse 2  MEWS RR 1  MEWS LOC 0  MEWS Score 3  MEWS Score Color Yellow  Assess: if the MEWS score is Yellow or Red  Were vital signs taken at a resting state? Yes  Focused Assessment No change from prior assessment  Does the patient meet 2 or more of the SIRS criteria? No  MEWS guidelines implemented *See Row Information* Yes  Treat  MEWS Interventions Administered scheduled meds/treatments  Pain Scale 0-10  Pain Score 0  Complains of Shortness of breath  Interventions Medication (see MAR);Reposition  Patients response to intervention Effective  Take Vital Signs  Increase Vital Sign Frequency  Yellow: Q 2hr X 2 then Q 4hr X 2, if remains yellow, continue Q 4hrs  Escalate  MEWS: Escalate Yellow: discuss with charge nurse/RN and consider discussing with provider and RRT  Notify: Charge Nurse/RN  Name of Charge Nurse/RN Notified Fraser Din RN  Date Charge Nurse/RN Notified 02/01/22  Time Charge Nurse/RN Notified 1400  Notify: Provider  Provider Name/Title Dana Allan  Date Provider Notified 02/01/22  Time Provider Notified 1430  Method of Notification  (secure chat)  Notification Reason Change in status  Provider response See new orders  Date of Provider Response 02/01/22  Time of Provider Response 1500  Notify: Rapid Response  Name of Rapid Response RN Notified not needed at this time; not notified  Document  Patient Outcome Stabilized after interventions  Progress note created (see row info) Yes  Assess: SIRS CRITERIA  SIRS Temperature  0  SIRS Pulse 1  SIRS Respirations  1  SIRS WBC 1  SIRS Score Sum  3   Patient on Amiodarone drip for increased HR and IV lasix for shortness of breath  r/t fluid overload. 2100 IV lasix dose moved to 1630 to help diurese and improve breathing. Will continue to monitor.

## 2022-02-02 ENCOUNTER — Other Ambulatory Visit (HOSPITAL_COMMUNITY): Payer: Self-pay

## 2022-02-02 DIAGNOSIS — N179 Acute kidney failure, unspecified: Secondary | ICD-10-CM

## 2022-02-02 DIAGNOSIS — I5023 Acute on chronic systolic (congestive) heart failure: Secondary | ICD-10-CM | POA: Diagnosis not present

## 2022-02-02 DIAGNOSIS — I4891 Unspecified atrial fibrillation: Secondary | ICD-10-CM | POA: Diagnosis not present

## 2022-02-02 DIAGNOSIS — K047 Periapical abscess without sinus: Secondary | ICD-10-CM | POA: Diagnosis not present

## 2022-02-02 LAB — BASIC METABOLIC PANEL
Anion gap: 14 (ref 5–15)
BUN: 45 mg/dL — ABNORMAL HIGH (ref 8–23)
CO2: 21 mmol/L — ABNORMAL LOW (ref 22–32)
Calcium: 8.8 mg/dL — ABNORMAL LOW (ref 8.9–10.3)
Chloride: 102 mmol/L (ref 98–111)
Creatinine, Ser: 2.28 mg/dL — ABNORMAL HIGH (ref 0.61–1.24)
GFR, Estimated: 29 mL/min — ABNORMAL LOW (ref >60)
Glucose, Bld: 133 mg/dL — ABNORMAL HIGH (ref 70–99)
Potassium: 4.1 mmol/L (ref 3.5–5.1)
Sodium: 137 mmol/L (ref 135–145)

## 2022-02-02 LAB — CBC
HCT: 41.2 % (ref 39.0–52.0)
Hemoglobin: 13.5 g/dL (ref 13.0–17.0)
MCH: 30.8 pg (ref 26.0–34.0)
MCHC: 32.8 g/dL (ref 30.0–36.0)
MCV: 93.8 fL (ref 80.0–100.0)
Platelets: 128 10*3/uL — ABNORMAL LOW (ref 150–400)
RBC: 4.39 MIL/uL (ref 4.22–5.81)
RDW: 13.8 % (ref 11.5–15.5)
WBC: 7.8 10*3/uL (ref 4.0–10.5)
nRBC: 0 % (ref 0.0–0.2)

## 2022-02-02 LAB — PROTIME-INR
INR: 2.3 — ABNORMAL HIGH (ref 0.8–1.2)
Prothrombin Time: 24.7 seconds — ABNORMAL HIGH (ref 11.4–15.2)

## 2022-02-02 MED ORDER — WARFARIN SODIUM 2 MG PO TABS
3.0000 mg | ORAL_TABLET | Freq: Once | ORAL | Status: AC
  Administered 2022-02-02: 3 mg via ORAL
  Filled 2022-02-02: qty 1

## 2022-02-02 MED ORDER — METOPROLOL TARTRATE 12.5 MG HALF TABLET
12.5000 mg | ORAL_TABLET | Freq: Two times a day (BID) | ORAL | Status: DC
  Administered 2022-02-02 – 2022-02-03 (×3): 12.5 mg via ORAL
  Filled 2022-02-02 (×3): qty 1

## 2022-02-02 MED ORDER — FUROSEMIDE 10 MG/ML IJ SOLN
80.0000 mg | Freq: Two times a day (BID) | INTRAMUSCULAR | Status: DC
  Administered 2022-02-02 (×2): 80 mg via INTRAVENOUS
  Filled 2022-02-02 (×2): qty 8

## 2022-02-02 NOTE — Progress Notes (Signed)
PROGRESS NOTE  Ricky Hayden TKP:[id number] DOB: [birth year] DOA: 01/31/2022 PCP: [facility]   LOS: 1 day   Brief Narrative / Interim history: As per H&P done on admission: " Ricky Hayden is a pleasant 76 y.o. male with medical history significant for chronic systolic CHF with CRT-P, COPD, CKD 3B, and atrial fibrillation on warfarin who presents to the emergency department with fatigue and shortness of breath.  He also reports worsening bilateral lower extremity swelling. Symptoms have been present for at least a week and he denies any associated fever, productive cough, or chest pain.  He is currently on antibiotics for dental infection and reports that he is being planned for upcoming tooth extraction.  He is also planned for TEE-DC.   Subjective / 24h Interval events: He is doing well this morning.  Denies any chest pain, denies any palpitations.  Assesement and Plan: Principal Problem:   Atrial fibrillation with RVR (HCC) Active Problems:   COPD (chronic obstructive pulmonary disease) (HCC)   Stage 3b chronic kidney disease (CKD) (HCC)   Acute on chronic systolic CHF (congestive heart failure) (HCC)   Dental infection   Acute respiratory failure with hypoxia (HCC)   AKI (acute kidney injury) ([place])   Principal problem Acute on chronic systolic CHF, acute hypoxic respiratory failure-patient was admitted to the hospital with fluid overload, found to be hypervolemic likely related to rapid A-fib.  Cardiology consulted and following.  He has been diuresed with Lasix, currently on 80 mg twice daily -Most recent 2D echo in May 2023 shows an EF of 30-35%. -Hold Coreg and Entresto due to AKI as well as soft blood pressure on admission  Active problems  Atrial fibrillation with RVR - Presents with fatigue and SOB and found to be in atrial fibrillation with rate 120s-130s, he was started on amiodarone infusion, continue.  Continue Coumadin per pharmacy.  Plans for cardioversion  tomorrow.  Acute kidney injury on chronic kidney disease stage IIIb-most recent creatinine 1.551.7, currently at 2.3, likely in the setting of diuresis as well as hemodynamic changes A-fib with RVR.  Continue Lasix, anticipate improvement after cardioversion tomorrow.  Dental infection  - On antibiotics, planned for upcoming tooth extraction.  Continue Unasyn   COPD - No cough or wheeze on admission. 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  There is also a component of fluid overload  Scheduled Meds:  feeding supplement  237 mL Oral BID BM   finasteride  2.5 mg Oral Daily   furosemide  80 mg Intravenous BID   influenza vaccine adjuvanted  0.5 mL Intramuscular Tomorrow-1000   metoprolol tartrate  12.5 mg Oral BID   mometasone-formoterol  2 puff Inhalation BID   pantoprazole  40 mg Oral Daily   PHENobarbital  64.8 mg Oral BID   pravastatin  40 mg Oral q1800   sodium chloride flush  3 mL Intravenous Q12H   umeclidinium bromide  1 puff Inhalation Daily   Warfarin - Pharmacist Dosing Inpatient   Does not apply q1600   Continuous Infusions:  amiodarone 30 mg/hr (02/02/22 0600)   ampicillin-sulbactam (UNASYN) IV 3 g (02/02/22 0813)   PRN Meds:.acetaminophen **OR** acetaminophen, albuterol, oxyCODONE  Current Outpatient Medications  Medication Instructions   acetaminophen (TYLENOL) 1,000 mg, Oral, Every 6 hours PRN   albuterol (PROVENTIL HFA;VENTOLIN HFA) 108 (90 BASE) MCG/ACT inhaler 2 puffs, Inhalation, Every 6 hours PRN   amiodarone (PACERONE)  200 MG tablet Take one tablet by mouth in the evening   amoxicillin (AMOXIL) 500 mg, Oral, 3 times daily   carvedilol (COREG) 6.25 mg, Oral, 2 times daily with meals   finasteride (PROSCAR) 2.5 mg, Oral, Daily   fluticasone-salmeterol (WIXELA INHUB) 250-50 MCG/ACT AEPB 1 puff, Inhalation, 2 times daily   Melatonin 10 mg, Oral,  At bedtime PRN   Multiple Vitamins-Minerals (PRESERVISION AREDS 2 PO) 1 tablet, Oral, 2 times daily   pantoprazole (PROTONIX) 40 mg, Oral, Daily   PHENobarbital (LUMINAL) 64.8 mg, Oral, 2 times daily   polyethylene glycol (MIRALAX / GLYCOLAX) 17 g, Oral, 2 times daily   pravastatin (PRAVACHOL) 40 MG tablet Every evening   sacubitril-valsartan (ENTRESTO) 24-26 MG 1 tablet, Oral, 2 times daily   spironolactone (ALDACTONE) 12.5 mg, Oral, Daily   Tiotropium Bromide Monohydrate (SPIRIVA RESPIMAT) 2.5 MCG/ACT AERS 2 each, Inhalation, Every evening   torsemide (DEMADEX) 40 mg, Oral, Daily, MAy take an additional 20 mg  or 40 mg in the afternoon if 3 lbs or 5 lbs overnight weight gain.   umeclidinium bromide (INCRUSE ELLIPTA) 62.5 MCG/ACT AEPB 1 puff, Inhalation, Daily   vitamin D3 (CHOLECALCIFEROL) 1,000 Units, Oral, Daily   warfarin (COUMADIN) 3 mg, Oral, Daily-1600    Diet Orders (From admission, onward)     Start     Ordered   02/02/22 0823  Diet heart healthy/carb modified Room service appropriate? Yes; Fluid consistency: Thin  Diet effective now       Question Answer Comment  Diet-HS Snack? Nothing   Room service appropriate? Yes   Fluid consistency: Thin      02/02/22 5638            DVT prophylaxis:    Lab Results  Component Value Date   PLT 128 (L) 02/02/2022      Code Status: Full Code  Family Communication: no family at bedside  Status is: Inpatient  Remains inpatient appropriate because: DCCV tomorrow  Level of care: Progressive  Consultants:  Cardiology  Objective: Vitals:   02/02/22 0645 02/02/22 0650 02/02/22 0752 02/02/22 0814  BP: 107/85 110/88  108/72  Pulse: (!) 128 (!) 121 (!) 122 (!) 124  Resp:   18 18  Temp:      TempSrc:      SpO2: 95% 93% 96% 95%  Weight:      Height:        Intake/Output Summary (Last 24 hours) at 02/02/2022 0915 Last data filed at 02/02/2022 0600 Gross per 24 hour  Intake 1753.62 ml  Output 500 ml  Net 1253.62 ml    Wt Readings from Last 3 Encounters:  02/02/22 102.7 kg  01/26/22 99.4 kg  01/08/22 97.9 kg    Examination:  Constitutional: NAD Eyes: no scleral icterus ENMT: Mucous membranes are moist.  Neck: normal, supple Respiratory: Coarse breath sounds bilaterally, no wheezing Cardiovascular: Irregularly irregular Abdomen: non distended, no tenderness. Bowel sounds positive.  Musculoskeletal: no clubbing / cyanosis.  Skin: no rashes Neurologic: non focal   Data Reviewed: I have independently reviewed following labs and imaging studies   CBC Recent Labs  Lab 01/26/22 1356 01/31/22 1949 02/01/22 0453 02/02/22 0319  WBC 5.7 6.8 5.6 7.8  HGB 14.3 12.8* 12.9* 13.5  HCT 43.9 38.7* 38.8* 41.2  PLT 146* 126* 114* 128*  MCV 93.0 92.4 92.8 93.8  MCH 30.3 30.5 30.9 30.8  MCHC 32.6 33.1 33.2 32.8  RDW 12.9 13.4 13.6 13.8    Recent Labs  Lab  01/26/22 1356 01/29/22 0000 01/31/22 1949 01/31/22 2337 02/01/22 0453 02/02/22 0319  NA 138  --  137  --  143 137  K 4.4  --  4.2  --  4.3 4.1  CL 106  --  104  --  105 102  CO2 22  --  23  --  25 21*  GLUCOSE 91  --  97  --  119* 133*  BUN 36*  --  50*  --  45* 45*  CREATININE 1.76*  --  1.92*  --  1.83* 2.28*  CALCIUM 9.1  --  9.0  --  9.4 8.8*  AST  --   --  19  --   --   --   ALT  --   --  20  --   --   --   ALKPHOS  --   --  55  --   --   --   BILITOT  --   --  0.7  --   --   --   ALBUMIN  --   --  3.7  --   --   --   MG  --   --   --   --  2.4  --   INR 1.9* 2.2  --  2.1*  --  2.3*  BNP  --   --  1,144.5*  --   --   --     ------------------------------------------------------------------------------------------------------------------ No results for input(s): "CHOL", "HDL", "LDLCALC", "TRIG", "CHOLHDL", "LDLDIRECT" in the last 72 hours.  Lab Results  Component Value Date   HGBA1C 5.7 (H) 09/02/2021   ------------------------------------------------------------------------------------------------------------------ No  results for input(s): "TSH", "T4TOTAL", "T3FREE", "THYROIDAB" in the last 72 hours.  Invalid input(s): "FREET3"  Cardiac Enzymes No results for input(s): "CKMB", "TROPONINI", "MYOGLOBIN" in the last 168 hours.  Invalid input(s): "CK" ------------------------------------------------------------------------------------------------------------------    Component Value Date/Time   BNP 1,144.5 (H) 01/31/2022 1949    CBG: No results for input(s): "GLUCAP" in the last 168 hours.  Recent Results (from the past 240 hour(s))  Resp Panel by RT-PCR (Flu A&B, Covid) Anterior Nasal Swab     Status: None   Collection Time: 01/31/22  9:00 PM   Specimen: Anterior Nasal Swab  Result Value Ref Range Status   SARS Coronavirus 2 by RT PCR NEGATIVE NEGATIVE Final    Comment: (NOTE) SARS-CoV-2 target nucleic acids are NOT DETECTED.  The SARS-CoV-2 RNA is generally detectable in upper respiratory specimens during the acute phase of infection. The lowest concentration of SARS-CoV-2 viral copies this assay can detect is 138 copies/mL. A negative result does not preclude SARS-Cov-2 infection and should not be used as the sole basis for treatment or other patient management decisions. A negative result may occur with  improper specimen collection/handling, submission of specimen other than nasopharyngeal swab, presence of viral mutation(s) within the areas targeted by this assay, and inadequate number of viral copies(<138 copies/mL). A negative result must be combined with clinical observations, patient history, and epidemiological information. The expected result is Negative.  Fact Sheet for Patients:  EntrepreneurPulse.com.au  Fact Sheet for Healthcare Providers:  IncredibleEmployment.be  This test is no t yet approved or cleared by the [place] FDA and  has been authorized for detection and/or diagnosis of SARS-CoV-2 by FDA under an Emergency Use  Authorization (EUA). This EUA will remain  in effect (meaning this test can be used) for the duration of the COVID-19 declaration under Section 564(b)(1) of the Act, 21  U.S.C.section 360bbb-3(b)(1), unless the authorization is terminated  or revoked sooner.       Influenza A by PCR NEGATIVE NEGATIVE Final   Influenza B by PCR NEGATIVE NEGATIVE Final    Comment: (NOTE) The Xpert Xpress SARS-CoV-2/FLU/RSV plus assay is intended as an aid in the diagnosis of influenza from Nasopharyngeal swab specimens and should not be used as a sole basis for treatment. Nasal washings and aspirates are unacceptable for Xpert Xpress SARS-CoV-2/FLU/RSV testing.  Fact Sheet for Patients: EntrepreneurPulse.com.au  Fact Sheet for Healthcare Providers: IncredibleEmployment.be  This test is not yet approved or cleared by the [place] FDA and has been authorized for detection and/or diagnosis of SARS-CoV-2 by FDA under an Emergency Use Authorization (EUA). This EUA will remain in effect (meaning this test can be used) for the duration of the COVID-19 declaration under Section 564(b)(1) of the Act, 21 U.S.C. section 360bbb-3(b)(1), unless the authorization is terminated or revoked.  Performed at [facility], [place] [address], [place], [place] [address]      Radiology Studies: No results found.   Marzetta Board, MD, PhD [facility]  Between 7 am - 7 pm I am available, please contact me via Amion (for emergencies) or Securechat (non urgent messages)  Between 7 pm - 7 am I am not available, please contact night coverage MD/APP via Amion

## 2022-02-02 NOTE — Progress Notes (Signed)
Mobility Specialist - Progress Note   02/02/22 1006  Mobility  Activity Contraindicated/medical hold   RN advised. Pt HR is elevated. Will follow up if timer permits.   Larey Seat

## 2022-02-02 NOTE — Consult Note (Addendum)
Rounding Note    Patient Name: Ricky Hayden Date of Encounter: 02/02/2022  [place] Cardiologist: Glenetta Hew, MD   Subjective   He is not certain if he is feeling better, states he has COPD and gets SOB, not on oxygen at home 24/7, reports chronic leg edema, denied any racing heart rate or heart palpitation from A fib, states he is fatigued mostly.   Inpatient Medications    Scheduled Meds:  feeding supplement  237 mL Oral BID BM   finasteride  2.5 mg Oral Daily   furosemide  80 mg Intravenous BID   influenza vaccine adjuvanted  0.5 mL Intramuscular Tomorrow-1000   metoprolol tartrate  12.5 mg Oral BID   mometasone-formoterol  2 puff Inhalation BID   pantoprazole  40 mg Oral Daily   PHENobarbital  64.8 mg Oral BID   pravastatin  40 mg Oral q1800   sodium chloride flush  3 mL Intravenous Q12H   umeclidinium bromide  1 puff Inhalation Daily   Warfarin - Pharmacist Dosing Inpatient   Does not apply q1600   Continuous Infusions:  amiodarone 30 mg/hr (02/02/22 0600)   ampicillin-sulbactam (UNASYN) IV Stopped (02/02/22 0018)   PRN Meds: acetaminophen **OR** acetaminophen, albuterol, oxyCODONE   Vital Signs    Vitals:   02/02/22 0250 02/02/22 0645 02/02/22 0650 02/02/22 0752  BP: 107/86 107/85 110/88   Pulse: 85 (!) 128 (!) 121 (!) 122  Resp: 16   18  Temp: 97.7 F (36.5 C)     TempSrc: Oral     SpO2: 95% 95% 93% 96%  Weight:      Height:        Intake/Output Summary (Last 24 hours) at 02/02/2022 0805 Last data filed at 02/02/2022 0600 Gross per 24 hour  Intake 1854.09 ml  Output 500 ml  Net 1354.09 ml      02/02/2022    2:45 AM 02/01/2022    1:25 PM 01/26/2022    2:02 PM  Last 3 Weights  Weight (lbs) 226 lb 6.6 oz 220 lb 7.4 oz 219 lb 3.2 oz  Weight (kg) 102.7 kg 100 kg 99.428 kg      Telemetry    A fib RVR 100-120s, PVCs occasionally  - Personally Reviewed  ECG    N/A today - Personally Reviewed  Physical Exam   GEN: No acute  distress.   Neck: JVD up to upper neck with HOB 60 degree  Cardiac: Irregularly irregular, no murmurs, rubs, or gallops.  Respiratory: wheezing, diminished at base, on 2LNC, pox 95%, speaks full sentence  GI: Soft, obese  MS: 1+ BLE edema Neuro:  Nonfocal  Psych: Normal affect   Labs    High Sensitivity Troponin:   Recent Labs  Lab 01/31/22 1949 01/31/22 2337  TROPONINIHS 23* 21*     Chemistry Recent Labs  Lab 01/31/22 1949 02/01/22 0453 02/02/22 0319  NA 137 143 137  K 4.2 4.3 4.1  CL 104 105 102  CO2 23 25 21*  GLUCOSE 97 119* 133*  BUN 50* 45* 45*  CREATININE 1.92* 1.83* 2.28*  CALCIUM 9.0 9.4 8.8*  MG  --  2.4  --   PROT 7.0  --   --   ALBUMIN 3.7  --   --   AST 19  --   --   ALT 20  --   --   ALKPHOS 55  --   --   BILITOT 0.7  --   --  GFRNONAA 35* 38* 29*  ANIONGAP 10 13 14     Lipids No results for input(s): "CHOL", "TRIG", "HDL", "LABVLDL", "LDLCALC", "CHOLHDL" in the last 168 hours.  Hematology Recent Labs  Lab 01/31/22 1949 02/01/22 0453 02/02/22 0319  WBC 6.8 5.6 7.8  RBC 4.19* 4.18* 4.39  HGB 12.8* 12.9* 13.5  HCT 38.7* 38.8* 41.2  MCV 92.4 92.8 93.8  MCH 30.5 30.9 30.8  MCHC 33.1 33.2 32.8  RDW 13.4 13.6 13.8  PLT 126* 114* 128*   Thyroid No results for input(s): "TSH", "FREET4" in the last 168 hours.  BNP Recent Labs  Lab 01/31/22 1949  BNP 1,144.5*    DDimer No results for input(s): "DDIMER" in the last 168 hours.   Radiology    CT CHEST ABDOMEN PELVIS W CONTRAST  Result Date: 01/31/2022 CLINICAL DATA:  Sepsis.  Atrial fibrillation with weakness. EXAM: CT CHEST, ABDOMEN, AND PELVIS WITH CONTRAST TECHNIQUE: Multidetector CT imaging of the chest, abdomen and pelvis was performed following the standard protocol during bolus administration of intravenous contrast. RADIATION DOSE REDUCTION: This exam was performed according to the departmental dose-optimization program which includes automated exposure control, adjustment of the mA  and/or kV according to patient size and/or use of iterative reconstruction technique. CONTRAST:  84mL OMNIPAQUE IOHEXOL 350 MG/ML SOLN COMPARISON:  12/11/2021. FINDINGS: CT CHEST FINDINGS Cardiovascular: The heart is enlarged and there is no pericardial effusion. Pacemaker leads are present in the heart. There is atherosclerotic calcification of the aorta with mild aneurysmal dilatation of the ascending aorta measuring 4.1 cm. The pulmonary trunk is distended suggesting underlying pulmonary artery hypertension. Mediastinum/Nodes: No mediastinal, hilar, or axillary lymphadenopathy. The thyroid gland, trachea, and esophagus are within normal limits. Lungs/Pleura: Apical pleural scarring is noted bilaterally. Mild paraseptal and centrilobular emphysematous changes are present in the lungs. Mild atelectasis is noted bilaterally. No effusion or pneumothorax. Musculoskeletal: Pacemaker device is present in the anterior left chest. Degenerative changes are noted in the thoracic spine. A mild compression deformity is noted in the superior endplate at T3. There is bony deformity of the sternum with sclerosis, which is likely chronic. CT ABDOMEN PELVIS FINDINGS Hepatobiliary: No focal liver abnormality is seen. Mild hepatic steatosis. No gallstones, gallbladder wall thickening, or biliary dilatation. Pancreas: Unremarkable. No pancreatic ductal dilatation or surrounding inflammatory changes. Spleen: Normal in size without focal abnormality. Adrenals/Urinary Tract: The adrenal glands are within normal limits. Kidneys enhance symmetrically. Multiple renal cysts are noted bilaterally. Additional subcentimeter hypodensities are present in the kidneys which are too small to further characterize. There is a complex exophytic hypodensity in the lower pole the left kidney measuring 1.2 cm in the lower pole of the left kidney, previously characterized as hemorrhagic or proteinaceous cyst. A stable 1.2 cm exophytic hyperdense lesion in  the lower pole of the left kidney. There is a nonobstructive calculus in the lower pole the left kidney. No hydroureteronephrosis bilaterally. The bladder is unremarkable. Stomach/Bowel: Stomach is within normal limits. Appendix appears normal. No evidence of bowel wall thickening, distention, or inflammatory changes. No free air or pneumatosis. Scattered diverticula are present along the colon without evidence of diverticulitis. Vascular/Lymphatic: Aortic atherosclerosis with aneurysmal dilatation of the infrarenal abdominal aorta measuring 3.1 cm. No enlarged abdominal or pelvic lymph nodes. Reproductive: Prostate is unremarkable. Other: No abdominopelvic ascites. There is diastasis of the rectus abdominus with a umbilical hernia containing fat and nonobstructed bowel. Musculoskeletal: Degenerative changes in the lumbar spine. No acute osseous abnormality. IMPRESSION: 1. No acute process in the chest, abdomen,  or pelvis. 2. 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 Recommend annual imaging followup by CTA or MRA. This recommendation follows 2010 ACCF/AHA/AATS/ACR/ASA/SCA/SCAI/SIR/STS/SVM Guidelines for the Diagnosis and Management of Patients with Thoracic Aortic Disease. Circulation. 2010; 121: H846-N629. Aortic aneurysm NOS (ICD10-I71.9) 3. Aneurysmal dilatation of the infrarenal abdominal aorta measuring 3.1 cm. Recommend follow-up ultrasound every 3 years. 4. Dilatation of the pulmonary trunk suggesting underlying pulmonary artery hypertension. 5. Remaining incidental findings as described above. Electronically Signed   By: Brett Fairy M.D.   On: 01/31/2022 23:38   DG Chest Portable 1 View  Result Date: 01/31/2022 CLINICAL DATA:  dyspnea EXAM: PORTABLE CHEST 1 VIEW COMPARISON:  November 02, 2021 FINDINGS: The cardiomediastinal silhouette is unchanged enlarged in contour.LEFT chest cardiac pacing device. Biapical pleuroparenchymal scarring. No pleural effusion.  No pneumothorax. Questionable nodular opacity in the LEFT lung, likely prominent vascular shadow. Visualized abdomen is unremarkable. IMPRESSION: Similar appearance of cardiomegaly. There is a questionable nodular opacity in the LEFT perihilar lung, likely a prominent vascular shadow. Recommend repeat PA and lateral chest radiograph for improved evaluation versus CT chest with contrast. Electronically Signed   By: Valentino Saxon M.D.   On: 01/31/2022 20:38    Cardiac Studies   TEE 09/26/21:  1. Left ventricular ejection fraction, by estimation, is 30 to 35%. The  left ventricle has moderately decreased function. The left ventricle  demonstrates global hypokinesis.   2. Right ventricular systolic function is normal. The right ventricular  size is normal.   3. No left atrial/left atrial appendage thrombus was detected.   4. The mitral valve is normal in structure. Trivial mitral valve  regurgitation. No evidence of mitral stenosis.   5. The aortic valve is tricuspid. Aortic valve regurgitation is trivial.  No aortic stenosis is present.   6. The inferior vena cava is normal in size with greater than 50%  respiratory variability, suggesting right atrial pressure of 3 mmHg.   Conclusion(s)/Recommendation(s): Normal biventricular function without  evidence of hemodynamically significant valvular heart disease.   Patient Profile     Ricky Hayden is a 76 y.o. 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  Assessment & Plan    Persistent atrial fibrillation/flutter with RVR - presented with SOB and LE edema for a week with A fib RVR  - seen by our A-fib clinic  on 9/25 and arrange for TEE/DCCV on 10/4, maintained on amiodarone 200mg  daily and coreg 12.5mg  BID prior  - ventricular rate remains fast at 100-120s at this time, CHF can be contributing  - will continue amiodarone gtt, BP is borderline low and coreg has been held,  will add metoprolol 12.5mg  BID for rate control -Continue warfarin, maintain INR 2-3, CHA2DS2-VASc Score = 6  -TEE/DCCV re-arranged to tomorrow to help restore rhythm    Acute on chronic combined HF exacerbation  NICM s/p CRT-P 2021 - possible due to tachycardia mediated  - BNP 1144, POA - on torsemide 40mg  daily at home, currently on IV Lasix 40mg  BID, Net +1.3L since admission, weight is 220>226 since admission, diuresis is inadequate, clinically hypervolemic, will increase IV Lasix to 80mg  BID today  - GDMT: home med Coreg, Entresto, spironolactone held currently due to low  BP and A fib RVR and worsening renal index , may resume when able   CKD III - renal index up trending today, Cr 2028 from 1.83, baseline probably 1.5-1.9 range  - suspect CRS , increasing lasix dosing as above, monitor UOP and BMP daily   HTN - BP borderline low now  AAA - 4.1 cm on CT, outpatient follow up   Acute hypoxic respiratory failure  COPD Type 2 DM Tooth infection  GERD - per primary team     Shared Decision Making/Informed Consent   The risks [stroke, cardiac arrhythmias rarely resulting in the need for a temporary or permanent pacemaker, skin irritation or burns, esophageal damage, perforation (1:10,000 risk), bleeding, pharyngeal hematoma as well as other potential complications associated with conscious sedation including aspiration, arrhythmia, respiratory failure and death], benefits (treatment guidance, restoration of normal sinus rhythm, diagnostic support) and alternatives of a transesophageal echocardiogram guided cardioversion were discussed in detail with Ricky Hayden and he is willing to proceed.      For questions or  updates, please contact [place] Please consult [email or web address] for contact info under        Signed, Margie Billet, NP  02/02/2022, 8:05 AM

## 2022-02-02 NOTE — Progress Notes (Signed)
Heart Failure Navigator Progress Note  Assessed for Heart & Vascular TOC clinic readiness.  Patient does not meet criteria due to has a AHF appointment for 02/12/22.     Ricky Hayden, BSN, Clinical cytogeneticist Only

## 2022-02-02 NOTE — Progress Notes (Signed)
[place] for Warfarin and Unasyn Indication: atrial fibrillation and dental infection  Allergies  Allergen Reactions   Calcium Channel Blockers Other (See Comments)    Came to hospital in 1995-caused chest pain    Jardiance [Empagliflozin] Other (See Comments)    reacted with phenobarbital (caused UTI)    Patient Measurements: Height: 5\' 7"  (170.2 cm) Weight: 102.7 kg (226 lb 6.6 oz) IBW/kg (Calculated) : 66.1  Vital Signs: Temp: 97.7 F (36.5 C) (10/02 0250) Temp Source: Oral (10/02 0250) BP: 108/72 (10/02 0814) Pulse Rate: 124 (10/02 0814)  Labs: Recent Labs    01/31/22 1949 01/31/22 2337 02/01/22 0453 02/02/22 0319  HGB 12.8*  --  12.9* 13.5  HCT 38.7*  --  38.8* 41.2  PLT 126*  --  114* 128*  LABPROT  --  23.7*  --  24.7*  INR  --  2.1*  --  2.3*  CREATININE 1.92*  --  1.83* 2.28*  TROPONINIHS 23* 21*  --   --      Estimated Creatinine Clearance: 31 mL/min (A) (by C-G formula based on SCr of 2.28 mg/dL (H)).   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 Sherwood Gambler   Nocturia    Obesity    Pansinusitis    a.  complicated by brain abscess and bleeding requiring craniotomy in  1995.   PONV (postoperative nausea and vomiting)    Presence of permanent cardiac pacemaker    Sinusitis    s/p ethmoidectomy and nasal septoplasty   Vertigo    intermitantly    Medications:  Scheduled:   feeding supplement  237 mL Oral BID BM   finasteride  2.5 mg Oral Daily   furosemide  80 mg Intravenous BID   influenza vaccine adjuvanted  0.5 mL Intramuscular Tomorrow-1000   metoprolol tartrate  12.5 mg Oral BID   mometasone-formoterol  2 puff Inhalation BID   pantoprazole  40 mg Oral Daily   PHENobarbital  64.8 mg Oral BID   pravastatin  40 mg Oral q1800   sodium chloride flush  3 mL Intravenous Q12H   umeclidinium bromide  1 puff Inhalation Daily   Warfarin - Pharmacist Dosing Inpatient   Does not apply q1600    Assessment: 76 years of age male with history of atrial fibrillation who presents with Afib with RVR and dental infection. Pharmacy consulted for warfarin and Unasyn.   INR continues to be within range at 2.3 this morning. CBC stable, no bleeding issues noted. Of note patient continues on IV amiodarone.   Home warfarin regimen:  4.5 mg on Saturday and 3 mg all other days.  Patient's last dose Saturday morning - 4.5 mg dose.   Goal of Therapy:  INR 2-3 Monitor platelets by anticoagulation protocol: Yes   Plan:  Continue with home dose of warfarin - 3mg  tonight Daily INR for now  Erin Hearing PharmD., BCPS Clinical Pharmacist 02/02/2022 11:13 AM

## 2022-02-02 NOTE — Progress Notes (Signed)
Heart Failure Navigator Progress Note  Following this hospitalization to assess for HV TOC readiness.   Afib on Amidarone gtt TEE/DCCV planned 10/3 EF 30-35%  (5/23)  Ricky Hayden, BSN, RN Heart Failure Leisure centre manager Chat Only

## 2022-02-02 NOTE — TOC Initial Note (Signed)
Transition of Care [facility]) - Initial/Assessment Note    Patient Details  Name: Ricky Hayden MRN: [id number] Date of Birth: 1946/05/04  Transition of Care [facility]) CM/SW Contact:    Bethena Roys, RN Phone Number: 02/02/2022, 4:33 PM  Clinical Narrative:  Risk for readmission assessment completed. PTA patient was from home alone. Patient states he has support of his children they rotate care. Patient is a member of the [facility]. Case Manager did call the [facility] Fees Coordinator- awaiting return phone call. Patient states he has DME: RW, Cane, BSC, and [place] in the home. Case Manager will continue to follow for transition of care needs as the patient progresses.                Expected Discharge Plan: Home/Self Care Barriers to Discharge: Continued Medical Work up   Patient Goals and CMS Choice Patient states their goals for this hospitalization and ongoing recovery are:: to return home.   Choice offered to / list presented to : NA  Expected Discharge Plan and Services Expected Discharge Plan: Home/Self Care In-house Referral: NA Discharge Planning Services: CM Consult Post Acute Care Choice: NA Living arrangements for the past 2 months: Single Family Home                 DME Arranged: N/A DME Agency: NA       HH Arranged: NA   Prior Living Arrangements/Services Living arrangements for the past 2 months: Single Family Home Lives with:: Self (Patient states his children rotate being in the home with him.) Patient language and need for interpreter reviewed:: Yes Do you feel safe going back to the place where you live?: Yes      Need for Family Participation in Patient Care: Yes (Comment) Care giver support system in place?: Yes (comment) Current home services: DME (patient has rolling walker, cane, BSC, and shower chair.) Criminal Activity/Legal Involvement Pertinent to Current Situation/Hospitalization: No - Comment as needed  Activities  of Daily Living Home Assistive Devices/Equipment: Walker (specify type) ADL Screening (condition at time of admission) Patient's cognitive ability adequate to safely complete daily activities?: Yes Is the patient deaf or have difficulty hearing?: No Does the patient have difficulty seeing, even when wearing glasses/contacts?: No Does the patient have difficulty concentrating, remembering, or making decisions?: No Patient able to express need for assistance with ADLs?: Yes Does the patient have difficulty dressing or bathing?: No Independently performs ADLs?: Yes (appropriate for developmental age) Does the patient have difficulty walking or climbing stairs?: Yes Weakness of Legs: None Weakness of Arms/Hands: None  Permission Sought/Granted Permission sought to share information with : Family Supports, Case Freight forwarder, Investment banker, corporate granted to share info w AGENCY: [place] Coordinator.        Emotional Assessment Appearance:: Appears stated age Attitude/Demeanor/Rapport: Engaged Affect (typically observed): Appropriate Orientation: : Oriented to Situation, Oriented to  Time, Oriented to Place, Oriented to Self Alcohol / Substance Use: Not Applicable Psych Involvement: No (comment)  Admission diagnosis:  Atrial fibrillation with RVR (HCC) [I48.91] Patient Active Problem List   Diagnosis Date Noted   AKI (acute kidney injury) ([place])    Atrial fibrillation with RVR ([place]) 02/01/2022   Acute on chronic systolic CHF (congestive heart failure) ([place]) 02/01/2022   Dental infection 02/01/2022   Acute respiratory failure with hypoxia ([place]) 02/01/2022   Atrial flutter ([place]) 01/26/2022   Hypercoagulable state due to paroxysmal atrial fibrillation ([place])  01/26/2022   Pyelonephritis 10/27/2021   Severe sepsis ([place]) 10/27/2021   Aortic aneurysm ([place]) 10/27/2021   Bilateral renal cysts 10/27/2021   Edema of left lower leg 10/26/2021    Encounter for therapeutic drug monitoring 10/14/2021   Biventricular ICD (implantable cardioverter-defibrillator) in place    Hypotension 09/26/2021   Debility 09/19/2021   Cardiac arrest ([place]) 09/18/2021   Delirium 09/18/2021   Morbid obesity ([place]) 09/18/2021   Nephrolithiasis 09/18/2021   Paroxysmal atrial fibrillation ([place]) 09/17/2021   History of craniotomy 09/17/2021   Seizure disorder ([place]) 09/17/2021   Normocytic anemia 09/17/2021   Pressure injury of sacral region, stage 2 ([place]) 09/17/2021   Goals of care, counseling/discussion    Type 2 diabetes mellitus with chronic kidney disease, without long-term current use of insulin ([place]) 09/14/2021   Bacteremia due to Gram-negative bacteria    Complicated UTI (urinary tract infection)    Acute on chronic respiratory failure with hypoxia and hypercapnia (HCC)    Septic shock (HCC)    Proteus (mirabilis) ([place]) as the cause of diseases classified elsewhere 09/02/2021   ARF (acute renal failure) ([place]) 08/29/2021   Presence of biventricular cardiac pacemaker 05/15/2020   PVC (premature ventricular contraction) 12/11/2019   NICM (nonischemic cardiomyopathy) ([place]) 12/11/2019   Stage 3b chronic kidney disease (CKD) ([place]) 08/12/2019   Hyperkalemia    Chronic combined systolic and diastolic CHF, NYHA class 3 ([place]) 06/10/2015   Leg swelling 07/16/2014   Preventative health care 03/07/2014   COPD (chronic obstructive pulmonary disease) ([place]) 09/11/2013   BPPV (benign paroxysmal positional vertigo) 10/05/2012   BPH (benign prostatic hyperplasia) 01/10/2008   Gout 04/22/2006   ERECTILE DYSFUNCTION 04/22/2006   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   Hyperlipidemia 03/15/2006   Essential hypertension 03/15/2006   GERD 03/15/2006   HIATAL HERNIA 03/15/2006   OSTEOARTHRITIS 03/15/2006   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   PCP:  Clinic, [place]:   [place], [place] - [address] [place] [address] Phone: [phone] Fax: [phone]  [id number] [place], [place] [address] [place] [address] Phone: [phone] Fax: [phone]  Readmission Risk Interventions    02/02/2022    4:30 PM 09/08/2021    3:40 PM 08/18/2019   11:08 AM  Readmission Risk Prevention Plan  Transportation Screening Complete Complete Complete  PCP or Specialist Appt within 3-5 Days   Complete  HRI or [place]   Complete  Social Work Consult for [place] Planning/Counseling   Complete  Palliative Care Screening   Complete  Medication Review Press photographer) Complete Referral to Pharmacy Complete  PCP or Specialist appointment within 3-5 days of discharge Complete Not Complete   PCP/Specialist Appt Not Complete comments  Patient currently in ICU   HRI or Home Care Consult Complete Not Complete   HRI or Home Care Consult Pt Refusal Comments  CIR recommended at this time   SW Recovery Care/Counseling Consult Complete Complete   Palliative Care Screening Not Applicable Not [place] Not Applicable Not Applicable

## 2022-02-03 ENCOUNTER — Encounter (HOSPITAL_COMMUNITY)
Admission: EM | Disposition: A | Discharge status: Home Health | Payer: Self-pay | Source: Home / Self Care | Attending: Internal Medicine

## 2022-02-03 ENCOUNTER — Inpatient Hospital Stay (HOSPITAL_COMMUNITY): Payer: No Typology Code available for payment source

## 2022-02-03 ENCOUNTER — Inpatient Hospital Stay: Payer: Self-pay

## 2022-02-03 ENCOUNTER — Inpatient Hospital Stay (HOSPITAL_COMMUNITY): Payer: No Typology Code available for payment source | Admitting: Critical Care Medicine

## 2022-02-03 DIAGNOSIS — I4892 Unspecified atrial flutter: Secondary | ICD-10-CM | POA: Diagnosis not present

## 2022-02-03 DIAGNOSIS — Z7984 Long term (current) use of oral hypoglycemic drugs: Secondary | ICD-10-CM

## 2022-02-03 DIAGNOSIS — I081 Rheumatic disorders of both mitral and tricuspid valves: Secondary | ICD-10-CM

## 2022-02-03 DIAGNOSIS — I4891 Unspecified atrial fibrillation: Secondary | ICD-10-CM

## 2022-02-03 DIAGNOSIS — E119 Type 2 diabetes mellitus without complications: Secondary | ICD-10-CM

## 2022-02-03 DIAGNOSIS — N179 Acute kidney failure, unspecified: Secondary | ICD-10-CM | POA: Diagnosis not present

## 2022-02-03 DIAGNOSIS — I5023 Acute on chronic systolic (congestive) heart failure: Secondary | ICD-10-CM | POA: Diagnosis not present

## 2022-02-03 DIAGNOSIS — I361 Nonrheumatic tricuspid (valve) insufficiency: Secondary | ICD-10-CM | POA: Diagnosis not present

## 2022-02-03 DIAGNOSIS — M1990 Unspecified osteoarthritis, unspecified site: Secondary | ICD-10-CM

## 2022-02-03 DIAGNOSIS — I340 Nonrheumatic mitral (valve) insufficiency: Secondary | ICD-10-CM | POA: Diagnosis not present

## 2022-02-03 DIAGNOSIS — N1832 Chronic kidney disease, stage 3b: Secondary | ICD-10-CM | POA: Diagnosis not present

## 2022-02-03 HISTORY — PX: CARDIOVERSION: SHX1299

## 2022-02-03 HISTORY — PX: TEE WITHOUT CARDIOVERSION: SHX5443

## 2022-02-03 LAB — CBC
HCT: 38.5 % — ABNORMAL LOW (ref 39.0–52.0)
Hemoglobin: 13.2 g/dL (ref 13.0–17.0)
MCH: 31.1 pg (ref 26.0–34.0)
MCHC: 34.3 g/dL (ref 30.0–36.0)
MCV: 90.8 fL (ref 80.0–100.0)
Platelets: 122 10*3/uL — ABNORMAL LOW (ref 150–400)
RBC: 4.24 MIL/uL (ref 4.22–5.81)
RDW: 13.9 % (ref 11.5–15.5)
WBC: 8.6 10*3/uL (ref 4.0–10.5)
nRBC: 0.2 % (ref 0.0–0.2)

## 2022-02-03 LAB — COOXEMETRY PANEL
Carboxyhemoglobin: 1.7 % — ABNORMAL HIGH (ref 0.5–1.5)
Methemoglobin: <0.7 % (ref 0.0–1.5)
O2 Saturation: 68.8 %
Total hemoglobin: 12.2 g/dL (ref 12.0–16.0)

## 2022-02-03 LAB — COMPREHENSIVE METABOLIC PANEL
ALT: 20 U/L (ref 0–44)
ALT: 19 U/L (ref 0–44)
AST: 16 U/L (ref 15–41)
AST: 18 U/L (ref 15–41)
Albumin: 3.1 g/dL — ABNORMAL LOW (ref 3.5–5.0)
Albumin: 3.1 g/dL — ABNORMAL LOW (ref 3.5–5.0)
Alkaline Phosphatase: 53 U/L (ref 38–126)
Alkaline Phosphatase: 50 U/L (ref 38–126)
Anion gap: 10 (ref 5–15)
Anion gap: 9 (ref 5–15)
BUN: 52 mg/dL — ABNORMAL HIGH (ref 8–23)
BUN: 55 mg/dL — ABNORMAL HIGH (ref 8–23)
CO2: 26 mmol/L (ref 22–32)
CO2: 29 mmol/L (ref 22–32)
Calcium: 9.2 mg/dL (ref 8.9–10.3)
Calcium: 9.1 mg/dL (ref 8.9–10.3)
Chloride: 103 mmol/L (ref 98–111)
Chloride: 103 mmol/L (ref 98–111)
Creatinine, Ser: 2.47 mg/dL — ABNORMAL HIGH (ref 0.61–1.24)
Creatinine, Ser: 2.46 mg/dL — ABNORMAL HIGH (ref 0.61–1.24)
GFR, Estimated: 26 mL/min — ABNORMAL LOW (ref >60)
GFR, Estimated: 26 mL/min — ABNORMAL LOW (ref >60)
Glucose, Bld: 151 mg/dL — ABNORMAL HIGH (ref 70–99)
Glucose, Bld: 134 mg/dL — ABNORMAL HIGH (ref 70–99)
Potassium: 4.3 mmol/L (ref 3.5–5.1)
Potassium: 4.3 mmol/L (ref 3.5–5.1)
Sodium: 139 mmol/L (ref 135–145)
Sodium: 141 mmol/L (ref 135–145)
Total Bilirubin: 0.7 mg/dL (ref 0.3–1.2)
Total Bilirubin: 0.7 mg/dL (ref 0.3–1.2)
Total Protein: 7 g/dL (ref 6.5–8.1)
Total Protein: 6.7 g/dL (ref 6.5–8.1)

## 2022-02-03 LAB — LACTIC ACID, PLASMA: Lactic Acid, Venous: 1.5 mmol/L (ref 0.5–1.9)

## 2022-02-03 LAB — MAGNESIUM: Magnesium: 2.5 mg/dL — ABNORMAL HIGH (ref 1.7–2.4)

## 2022-02-03 LAB — PROTIME-INR
INR: 2.4 — ABNORMAL HIGH (ref 0.8–1.2)
Prothrombin Time: 26.3 seconds — ABNORMAL HIGH (ref 11.4–15.2)

## 2022-02-03 LAB — BRAIN NATRIURETIC PEPTIDE: B Natriuretic Peptide: 2755.5 pg/mL — ABNORMAL HIGH (ref 0.0–100.0)

## 2022-02-03 SURGERY — ECHOCARDIOGRAM, TRANSESOPHAGEAL
Anesthesia: Monitor Anesthesia Care

## 2022-02-03 MED ORDER — SODIUM CHLORIDE 0.9% FLUSH: 10.0000 mL | INTRAVENOUS | Status: DC | PRN

## 2022-02-03 MED ORDER — PROPOFOL 500 MG/50ML IV EMUL
INTRAVENOUS | Status: DC | PRN
  Administered 2022-02-03: 125 ug/kg/min via INTRAVENOUS

## 2022-02-03 MED ORDER — LACTATED RINGERS IV SOLN: INTRAVENOUS | Status: DC | PRN

## 2022-02-03 MED ORDER — CHLORHEXIDINE GLUCONATE CLOTH 2 % EX PADS
6.0000 | MEDICATED_PAD | Freq: Every day | CUTANEOUS | Status: DC
  Administered 2022-02-03 – 2022-02-05 (×3): 6 via TOPICAL

## 2022-02-03 MED ORDER — PHENYLEPHRINE HCL-NACL 20-0.9 MG/250ML-% IV SOLN
INTRAVENOUS | Status: DC | PRN
  Administered 2022-02-03: 50 ug/min via INTRAVENOUS

## 2022-02-03 MED ORDER — SODIUM CHLORIDE 0.9% FLUSH
10.0000 mL | Freq: Two times a day (BID) | INTRAVENOUS | Status: DC
  Administered 2022-02-04 – 2022-02-05 (×2): 10 mL

## 2022-02-03 MED ORDER — WARFARIN SODIUM 3 MG PO TABS
3.0000 mg | ORAL_TABLET | Freq: Once | ORAL | Status: AC
  Administered 2022-02-03: 3 mg via ORAL
  Filled 2022-02-03 (×2): qty 1

## 2022-02-03 MED ORDER — SODIUM CHLORIDE 0.9 % IV SOLN
3.0000 g | Freq: Two times a day (BID) | INTRAVENOUS | Status: DC
  Administered 2022-02-03 – 2022-02-04 (×3): 3 g via INTRAVENOUS
  Filled 2022-02-03 (×5): qty 8

## 2022-02-03 MED ORDER — PROPOFOL 10 MG/ML IV BOLUS
INTRAVENOUS | Status: DC | PRN
  Administered 2022-02-03 (×2): 10 mg via INTRAVENOUS

## 2022-02-03 MED ORDER — PHENYLEPHRINE 80 MCG/ML (10ML) SYRINGE FOR IV PUSH (FOR BLOOD PRESSURE SUPPORT)
PREFILLED_SYRINGE | INTRAVENOUS | Status: DC | PRN
  Administered 2022-02-03: 200 ug via INTRAVENOUS
  Administered 2022-02-03 (×2): 80 ug via INTRAVENOUS

## 2022-02-03 NOTE — Progress Notes (Addendum)
Rounding Note    Patient Name: Ricky Hayden Date of Encounter: 02/03/2022  [place] Cardiologist: Glenetta Hew, MD   Subjective   Still with some shortness of breath today.  Remains in atrial fibrillation with heart rates in the 110s.  Lasix increased yesterday still with minimal urine output 650 cc and he is net +859 cc since admission.  Weight continues to trend upward and he is now up 4 kg since yesterday  Inpatient Medications    Scheduled Meds:  feeding supplement  237 mL Oral BID BM   finasteride  2.5 mg Oral Daily   furosemide  80 mg Intravenous BID   influenza vaccine adjuvanted  0.5 mL Intramuscular Tomorrow-1000   metoprolol tartrate  12.5 mg Oral BID   mometasone-formoterol  2 puff Inhalation BID   pantoprazole  40 mg Oral Daily   PHENobarbital  64.8 mg Oral BID   pravastatin  40 mg Oral q1800   sodium chloride flush  3 mL Intravenous Q12H   umeclidinium bromide  1 puff Inhalation Daily   Warfarin - Pharmacist Dosing Inpatient   Does not apply q1600   Continuous Infusions:  amiodarone 30 mg/hr (02/03/22 0600)   ampicillin-sulbactam (UNASYN) IV Stopped (02/03/22 0046)   PRN Meds: acetaminophen **OR** acetaminophen, albuterol, oxyCODONE   Vital Signs    Vitals:   02/02/22 1939 02/03/22 0005 02/03/22 0419 02/03/22 0721  BP: 112/81 112/83 104/88   Pulse: (!) 122 (!) 121 (!) 110   Resp: 20 20 20 18   Temp: 98.1 F (36.7 C) (!) 97.5 F (36.4 C) 97.6 F (36.4 C)   TempSrc: Oral Oral Oral   SpO2: 99% 96% 94%   Weight:      Height:        Intake/Output Summary (Last 24 hours) at 02/03/2022 0750 Last data filed at 02/03/2022 0600 Gross per 24 hour  Intake 1509.72 ml  Output 650 ml  Net 859.72 ml       02/02/2022   12:00 PM 02/02/2022    2:45 AM 02/01/2022    1:25 PM  Last 3 Weights  Weight (lbs) 217 lb 2.5 oz 226 lb 6.6 oz 220 lb 7.4 oz  Weight (kg) 98.5 kg 102.7 kg 100 kg      Telemetry    Atrial fibrillation with RVR- Personally  Reviewed  ECG    N/A today - Personally Reviewed  Physical Exam   GEN: Well nourished, well developed in no acute distress HEENT: Normal NECK: Still appears to have JVD to the jaw LYMPHATICS: No lymphadenopathy CARDIAC: Irregularly irregular and tachycardic, no murmurs, rubs, gallops RESPIRATORY: Scattered crackles and expiratory wheezes ABDOMEN: Soft, non-tender, non-distended MUSCULOSKELETAL:  2+ BLE edema; No deformity  SKIN: Warm and dry NEUROLOGIC:  Alert and oriented x 3 PSYCHIATRIC:  Normal affect   Labs    High Sensitivity Troponin:   Recent Labs  Lab 01/31/22 1949 01/31/22 2337  TROPONINIHS 23* 21*      Chemistry Recent Labs  Lab 01/31/22 1949 02/01/22 0453 02/02/22 0319 02/03/22 0152  NA 137 143 137 139  K 4.2 4.3 4.1 4.3  CL 104 105 102 103  CO2 23 25 21* 26  GLUCOSE 97 119* 133* 151*  BUN 50* 45* 45* 52*  CREATININE 1.92* 1.83* 2.28* 2.47*  CALCIUM 9.0 9.4 8.8* 9.2  MG  --  2.4  --  2.5*  PROT 7.0  --   --  7.0  ALBUMIN 3.7  --   --  3.1*  AST 19  --   --  16  ALT 20  --   --  20  ALKPHOS 55  --   --  53  BILITOT 0.7  --   --  0.7  GFRNONAA 35* 38* 29* 26*  ANIONGAP 10 13 14 10      Lipids No results for input(s): "CHOL", "TRIG", "HDL", "LABVLDL", "LDLCALC", "CHOLHDL" in the last 168 hours.  Hematology Recent Labs  Lab 02/01/22 0453 02/02/22 0319 02/03/22 0152  WBC 5.6 7.8 8.6  RBC 4.18* 4.39 4.24  HGB 12.9* 13.5 13.2  HCT 38.8* 41.2 38.5*  MCV 92.8 93.8 90.8  MCH 30.9 30.8 31.1  MCHC 33.2 32.8 34.3  RDW 13.6 13.8 13.9  PLT 114* 128* 122*    Thyroid No results for input(s): "TSH", "FREET4" in the last 168 hours.  BNP Recent Labs  Lab 01/31/22 1949  BNP 1,144.5*     DDimer No results for input(s): "DDIMER" in the last 168 hours.   Radiology    No results found.  Cardiac Studies   TEE 09/26/21:  1. Left ventricular ejection fraction, by estimation, is 30 to 35%. The  left ventricle has moderately decreased function.  The left ventricle  demonstrates global hypokinesis.   2. Right ventricular systolic function is normal. The right ventricular  size is normal.   3. No left atrial/left atrial appendage thrombus was detected.   4. The mitral valve is normal in structure. Trivial mitral valve  regurgitation. No evidence of mitral stenosis.   5. The aortic valve is tricuspid. Aortic valve regurgitation is trivial.  No aortic stenosis is present.   6. The inferior vena cava is normal in size with greater than 50%  respiratory variability, suggesting right atrial pressure of 3 mmHg.   Conclusion(s)/Recommendation(s): Normal biventricular function without  evidence of hemodynamically significant valvular heart disease.   Patient Profile     Roben Schliep is a 76 y.o. male with a hx of a PMH of combined systolic and diastolic CHF s/p CRT-P 7353, nonischemic cardiomyopathy, persistent atrial fibrillation, essential hypertension, LBBB, aortic aneurysm, hiatal hernia, COPD, GERD, type 2 diabetes, stage III CKD, hyperlipidemia and chronic lower extremity swelling, cardiology is following since 02/01/2022 for the evaluation of atrial fibrillation.  Assessment & Plan    Persistent atrial fibrillation/flutter with RVR -presented with SOB and LE edema for a week with A fib RVR  -seen by our A-fib clinic on 9/25 and arrange for TEE/DCCV on 10/4, maintained on amiodarone 200mg  daily and coreg 12.5mg  BID prior  - ventricular rate remains fast at 100-120s at this time, CHF can be contributing  -Continue amiodarone drip and Lopressor 12.5 mg twice daily -Cannot titrate beta-blocker further due to soft BP -INR 2.4 today -Continue warfarin, maintain INR 2-3, CHA2DS2-VASc Score = 6  -He is n.p.o. for TEE/cardioversion today   Acute on chronic combined HF exacerbation  NICM s/p CRT-P 2021 - possible due to tachycardia mediated  - BNP 1144, POA - on torsemide 40mg  daily at home, currently on IV Lasix 80mg  BID -He did not  significantly increase urine output despite increasing IV Lasix yesterday to 80 mg IV twice daily  -He put out 650 cc yesterday and was net +859 cc since admission with a 4 kg weight gain since yesterday  -EXTR is confusing as he still has what appears to be JVD on exam.  His lungs have some crackles at the bases and diffuse wheezing as well but difficult to determine how much  of this is related to his COPD. -I am going to repeat a BNP today to reassess volume given that his serum creatinine has bumped up to 2.47 from 2.28 yesterday -Hold further Lasix until we get results of BMP back -BNP remains elevated and diuresis does not pick up with improvement in renal function post cardioversion May need right heart catheterization - GDMT: home med Coreg, Entresto, spironolactone held currently due to low BP and A fib RVR and worsening renal index , may resume when able  -Plan for TEE/DCCV today  CKD III - renal index up trending today, Cr 2028 from 1.83, baseline probably 1.5-1.9 range  - suspect CRS but serum creatinine is trending upward despite higher doses of diuretics. -Check BMP today  HTN - BP borderline low now  AAA - 4.1 cm on CT, outpatient follow up   Acute hypoxic respiratory failure  COPD Type 2 DM Tooth infection  GERD - per primary team   I have spent a total of 30 minutes with patient reviewing 2D echo , telemetry, EKGs, labs and examining patient as well as establishing an assessment and plan that was discussed with the patient.  > 50% of time was spent in direct patient care.     For questions or updates, please contact [place] Please consult [email or web address] for contact info under        Signed, Fransico Him, MD  02/03/2022, 7:50 AM

## 2022-02-03 NOTE — Anesthesia Preprocedure Evaluation (Signed)
Anesthesia Evaluation  Patient identified by MRN, date of birth, ID band Patient awake    Reviewed: Allergy & Precautions, H&P , NPO status , Patient's Chart, lab work & pertinent test results  History of Anesthesia Complications (+) PONV and history of anesthetic complications  Airway Mallampati: II   Neck ROM: full    Dental   Pulmonary shortness of breath, COPD, former smoker,    breath sounds clear to auscultation       Cardiovascular hypertension, + CAD and +CHF  + dysrhythmias Atrial Fibrillation + pacemaker  Rhythm:irregular Rate:Normal     Neuro/Psych Seizures -,   Neuromuscular disease    GI/Hepatic hiatal hernia, GERD  ,  Endo/Other  diabetes, Type 2  Renal/GU Renal InsufficiencyRenal disease     Musculoskeletal  (+) Arthritis ,   Abdominal   Peds  Hematology   Anesthesia Other Findings   Reproductive/Obstetrics                             Anesthesia Physical Anesthesia Plan  ASA: 3  Anesthesia Plan: MAC   Post-op Pain Management:    Induction: Intravenous  PONV Risk Score and Plan: 2 and Propofol infusion and Treatment may vary due to age or medical condition  Airway Management Planned: Nasal Cannula  Additional Equipment:   Intra-op Plan:   Post-operative Plan:   Informed Consent: I have reviewed the patients History and Physical, chart, labs and discussed the procedure including the risks, benefits and alternatives for the proposed anesthesia with the patient or authorized representative who has indicated his/her understanding and acceptance.     Dental advisory given  Plan Discussed with: CRNA, Anesthesiologist and Surgeon  Anesthesia Plan Comments:         Anesthesia Quick Evaluation

## 2022-02-03 NOTE — Progress Notes (Signed)
Peripherally Inserted Central Catheter Placement  The IV Nurse has discussed with the patient and/or persons authorized to consent for the patient, the purpose of this procedure and the potential benefits and risks involved with this procedure.  The benefits include less needle sticks, lab draws from the catheter, and the patient may be discharged home with the catheter. Risks include, but not limited to, infection, bleeding, blood clot (thrombus formation), and puncture of an artery; nerve damage and irregular heartbeat and possibility to perform a PICC exchange if needed/ordered by physician.  Alternatives to this procedure were also discussed.  Bard Power PICC patient education guide, fact sheet on infection prevention and patient information card has been provided to patient /or left at bedside.    PICC Placement Documentation  PICC Double Lumen 58/68/25 Right Basilic 41 cm 2 cm (Active)  Indication for Insertion or Continuance of Line Vasoactive infusions 02/03/22 1701  Exposed Catheter (cm) 2 cm 02/03/22 1701  Site Assessment Clean, Dry, Intact 02/03/22 1701  Lumen #1 Status Flushed;Blood return noted;Saline locked 02/03/22 1701  Lumen #2 Status Flushed;Blood return noted;Saline locked 02/03/22 1701  Dressing Type Transparent 02/03/22 1701  Dressing Status Antimicrobial disc in place 02/03/22 1701  Dressing Change Due 02/10/22 02/03/22 1701       Scotty Court 02/03/2022, 5:03 PM

## 2022-02-03 NOTE — Progress Notes (Signed)
Pharmacy Antibiotic Note  Ricky Hayden is a 76 y.o. male admitted on 01/31/2022 with  AFib . Pt started on amoxicillin prior to admission for dental infection. Pharmacy has been consulted for Unasyn dosing. Cr continues to rise, today CrCl ~28 ml/min.  Plan: Reduce Unasyn to 3g IV q12h Follow Cr trend  Height: 5\' 7"  (170.2 cm) Weight: 98.5 kg (217 lb 2.5 oz) (think previous weight was inaccurate, removed all extra items from bed and got 98.5 kg) IBW/kg (Calculated) : 66.1  Temp (24hrs), Avg:97.8 F (36.6 C), Min:97.5 F (36.4 C), Max:98.3 F (36.8 C)  Recent Labs  Lab 01/31/22 1949 02/01/22 0453 02/02/22 0319 02/03/22 0152  WBC 6.8 5.6 7.8 8.6  CREATININE 1.92* 1.83* 2.28* 2.47*    Estimated Creatinine Clearance: 28 mL/min (A) (by C-G formula based on SCr of 2.47 mg/dL (H)).    Allergies  Allergen Reactions   Calcium Channel Blockers Other (See Comments)    Came to hospital in 1995-caused chest pain    Jardiance [Empagliflozin] Other (See Comments)    reacted with phenobarbital (caused UTI)     Arrie Senate, PharmD, BCPS, [facility] Clinical Pharmacist [phone] Please check AMION for all [facility] Pharmacy numbers 02/03/2022

## 2022-02-03 NOTE — TOC Progression Note (Signed)
Transition of Care [facility]) - Progression Note    Patient Details  Name: Ricky Hayden MRN: [id number] Date of Birth: 02-04-1946  [facility] CM/SW Contact  Graves-Bigelow, Ocie Cornfield, RN Phone Number: 02/03/2022, 10:16 AM  Clinical Narrative:  Case Manager received notification from the [place]. Patient is a member of the [facility]. Dr. Domenica Fail is PCP and CSW is Cyndee Brightly that can be reached at [phone] ext [phone] for any disposition needs. Case Manager will continue to follow for additional transition of care needs.   Expected Discharge Plan: Home/Self Care Barriers to Discharge: Continued Medical Work up  Expected Discharge Plan and Services Expected Discharge Plan: Home/Self Care In-house Referral: NA Discharge Planning Services: CM Consult Post Acute Care Choice: NA Living arrangements for the past 2 months: Single Family Home                 DME Arranged: N/A DME Agency: NA       HH Arranged: NA    Readmission Risk Interventions    02/02/2022    4:30 PM 09/08/2021    3:40 PM 08/18/2019   11:08 AM  Readmission Risk Prevention Plan  Transportation Screening Complete Complete Complete  PCP or Specialist Appt within 3-5 Days   Complete  HRI or [place]   Complete  Social Work Consult for [place] Planning/Counseling   Complete  Palliative Care Screening   Complete  Medication Review Press photographer) Complete Referral to Pharmacy Complete  PCP or Specialist appointment within 3-5 days of discharge Complete Not Complete   PCP/Specialist Appt Not Complete comments  Patient currently in ICU   [place] or [place] Complete Not Complete   HRI or Home Care Consult Pt Refusal Comments  CIR recommended at this time   SW Recovery Care/Counseling Consult Complete Complete   Palliative Care Screening Not Applicable Not [place] Not Applicable Not Applicable

## 2022-02-03 NOTE — Transfer of Care (Signed)
Immediate Anesthesia Transfer of Care Note  Patient: Ricky Hayden  Procedure(s) Performed: TRANSESOPHAGEAL ECHOCARDIOGRAM (TEE) CARDIOVERSION  Patient Location: Endoscopy Unit  Anesthesia Type:MAC  Level of Consciousness: drowsy  Airway & Oxygen Therapy: Patient Spontanous Breathing and Patient connected to nasal cannula oxygen  Post-op Assessment: Report given to RN and Post -op Vital signs reviewed and stable  Post vital signs: Reviewed and stable  Last Vitals:  Vitals Value Taken Time  BP 101/76 02/03/22 1035  Temp    Pulse 87 02/03/22 1037  Resp 20 02/03/22 1037  SpO2 90 % 02/03/22 1037  Vitals shown include unvalidated device data.  Last Pain:  Vitals:   02/03/22 0917  TempSrc: Tympanic  PainSc: 0-No pain      Patients Stated Pain Goal: 0 (68/08/81 1031)  Complications: No notable events documented.

## 2022-02-03 NOTE — CV Procedure (Signed)
TEE/DCC: On Rx coumadin NR 2.4 Anesthesia: Propofol  Patient with rapid afib 122 bpm Sats 88% with propofol and required 100ug/min Neo to support BP  No LAA thrombus Mild MR Moderate TR Severe RV dysfunction  Severe LV dysfunction 20%  DCC x 1 200J converted from Afib to NSR with BiV pacing rate 84 bpm  Sats improved as he awoke Neo weaned over 15 minutes.  Have asked CHF team to see and transfer to 2 H for more intense Rx Of his advanced bi ventricular failure  Family updated  Jenkins Rouge MD [facility]

## 2022-02-03 NOTE — Progress Notes (Signed)
[place] for Warfarin Indication: AFib  Allergies  Allergen Reactions   Calcium Channel Blockers Other (See Comments)    Came to hospital in 1995-caused chest pain    Jardiance [Empagliflozin] Other (See Comments)    reacted with phenobarbital (caused UTI)    Patient Measurements: Height: 5\' 7"  (170.2 cm) Weight: 98.5 kg (217 lb 2.5 oz) (think previous weight was inaccurate, removed all extra items from bed and got 98.5 kg) IBW/kg (Calculated) : 66.1  Vital Signs: Temp: 97.5 F (36.4 C) (10/03 1037) Temp Source: Tympanic (10/03 1037) BP: 121/82 (10/03 1200) Pulse Rate: 89 (10/03 1207)  Labs: Recent Labs    01/31/22 1949 01/31/22 2337 02/01/22 0453 02/02/22 0319 02/03/22 0152  HGB 12.8*  --  12.9* 13.5 13.2  HCT 38.7*  --  38.8* 41.2 38.5*  PLT 126*  --  114* 128* 122*  LABPROT  --  23.7*  --  24.7* 26.3*  INR  --  2.1*  --  2.3* 2.4*  CREATININE 1.92*  --  1.83* 2.28* 2.47*  TROPONINIHS 23* 21*  --   --   --      Estimated Creatinine Clearance: 28 mL/min (A) (by C-G formula based on SCr of 2.47 mg/dL (H)).   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 Sherwood Gambler   Nocturia     Obesity    Pansinusitis    a.  complicated by brain abscess and bleeding requiring craniotomy in 1995.   PONV (postoperative nausea and vomiting)    Presence of permanent cardiac pacemaker    Sinusitis    s/p ethmoidectomy and nasal septoplasty   Vertigo    intermitantly    Medications:  Scheduled:   [MAR Hold] feeding supplement  237 mL Oral BID BM   [MAR Hold] finasteride  2.5 mg Oral Daily   influenza vaccine adjuvanted  0.5 mL Intramuscular Tomorrow-1000   [MAR Hold] metoprolol tartrate  12.5 mg Oral BID   [MAR Hold] mometasone-formoterol  2 puff Inhalation BID   [MAR Hold] pantoprazole  40 mg Oral Daily   [MAR Hold] PHENobarbital  64.8 mg Oral BID   [MAR Hold] pravastatin  40 mg Oral q1800   [MAR Hold] sodium chloride flush  3 mL Intravenous Q12H   [MAR Hold] umeclidinium bromide  1 puff Inhalation Daily   [MAR Hold] Warfarin - Pharmacist Dosing Inpatient   Does not apply q1600    Assessment: 76 years of age male with history of atrial fibrillation who presents with Afib with RVR and dental infection. Pharmacy consulted for warfarin.  INR remains therapeutic at 2.4, CBC stable. Pt s/p DCCV 10/3. IV amiodarone continues.  Home warfarin regimen: 4.5 mg on Saturday and 3 mg all other days.   Goal of Therapy:  INR 2-3 Monitor platelets by anticoagulation protocol: Yes   Plan:  Warfarin 3mg  PO x1 tonight Daily protime  Arrie Senate, PharmD, BCPS, [facility] Clinical Pharmacist [phone] Please check AMION for all [facility] Pharmacy numbers 02/03/2022

## 2022-02-03 NOTE — Progress Notes (Signed)
PROGRESS NOTE  Ricky Hayden LYY:[id number] DOB: 1946-02-14 DOA: 01/31/2022 PCP: [facility]   LOS: 2 days   Brief Narrative / Interim history: As per H&P done on admission: " Ricky Hayden is a pleasant 76 y.o. male with medical history significant for chronic systolic CHF with CRT-P, COPD, CKD 3B, and atrial fibrillation on warfarin who presents to the emergency department with fatigue and shortness of breath.  He also reports worsening bilateral lower extremity swelling. Symptoms have been present for at least a week and he denies any associated fever, productive cough, or chest pain.  He is currently on antibiotics for dental infection and reports that he is being planned for upcoming tooth extraction.  He is also planned for TEE-DC.   Subjective / 24h Interval events: Denies any chest pain, minimal shortness of breath persistent.  Awaiting DCCV cardioversion today.  Assesement and Plan: Principal Problem:   Atrial fibrillation with RVR (HCC) Active Problems:   COPD (chronic obstructive pulmonary disease) (HCC)   Stage 3b chronic kidney disease (CKD) (HCC)   Acute on chronic systolic CHF (congestive heart failure) (HCC)   Dental infection   Acute respiratory failure with hypoxia (HCC)   AKI (acute kidney injury) ([place])   Principal problem Acute on chronic systolic CHF, acute hypoxic respiratory failure, nonischemic cardiomyopathy status post CRT 2021-patient was admitted to the hospital with fluid overload, found to be hypervolemic likely related to rapid A-fib.  Cardiology consulted and following.  He has been placed on Lasix, but now hold due to significant creatinine elevation.  Despite diuresis he is net +2.2 L -Most recent 2D echo in May 2023 shows an EF of 30-35%. TEE today showed severe RV dysfunction, severe LV dysfunction EF 20%.  He was cardioverted. Cardiology will ask heart failure team to consult for his severe biventricular dysfunction, and he will be  transferred to [facility]. -Further management per advanced heart failure team  Active problems  Atrial fibrillation with RVR - Presents with fatigue and SOB and found to be in atrial fibrillation with rate 120s-130s, he was started on amiodarone infusion, continue.  Continue Coumadin per pharmacy.  Plans for TEE cardioversion today  Acute kidney injury on chronic kidney disease stage IIIb-most recent creatinine 1.5-1.7, currently at 2.5, likely in the setting of diuresis as well as hemodynamic changes A-fib with RVR.  For now hold Lasix, advanced heart failure was consulted, appreciate input.  Dental infection  - On antibiotics, planned for upcoming tooth extraction.  Continue Unasyn   COPD - No cough or wheeze on admission. Continue inhalers.  Overall stable   AAA- Outpatient follow-up recommended with vascular  Thrombocytopenia-chronic, overall stable, no bleeding  Obesity, class I-BMI 35.  There is also a component of fluid overload  Scheduled Meds:  [MAR Hold] feeding supplement  237 mL Oral BID BM   [MAR Hold] finasteride  2.5 mg Oral Daily   influenza vaccine adjuvanted  0.5 mL Intramuscular Tomorrow-1000   [MAR Hold] metoprolol tartrate  12.5 mg Oral BID   [MAR Hold] mometasone-formoterol  2 puff Inhalation BID   [MAR Hold] pantoprazole  40 mg Oral Daily   [MAR Hold] PHENobarbital  64.8 mg Oral BID   [MAR Hold] pravastatin  40 mg Oral q1800   [MAR Hold] sodium chloride flush  3 mL Intravenous Q12H   [MAR Hold] umeclidinium bromide  1 puff Inhalation Daily   [MAR Hold] Warfarin - Pharmacist Dosing Inpatient   Does not apply q1600   Continuous Infusions:  amiodarone  30 mg/hr (02/03/22 0600)   [MAR Hold] ampicillin-sulbactam (UNASYN) IV Stopped (02/03/22 0850)   PRN Meds:.[MAR Hold] acetaminophen **OR** [MAR Hold] acetaminophen, [MAR Hold] albuterol, [MAR Hold] oxyCODONE  Current Outpatient Medications  Medication Instructions   acetaminophen (TYLENOL) 1,000 mg, Oral, Every 6  hours PRN   albuterol (PROVENTIL HFA;VENTOLIN HFA) 108 (90 BASE) MCG/ACT inhaler 2 puffs, Inhalation, Every 6 hours PRN   amiodarone (PACERONE) 200 MG tablet Take one tablet by mouth in the evening   amoxicillin (AMOXIL) 500 mg, Oral, 3 times daily   carvedilol (COREG) 6.25 mg, Oral, 2 times daily with meals   finasteride (PROSCAR) 2.5 mg, Oral, Daily   fluticasone-salmeterol (WIXELA INHUB) 250-50 MCG/ACT AEPB 1 puff, Inhalation, 2 times daily   Melatonin 10 mg, Oral, At bedtime PRN   Multiple Vitamins-Minerals (PRESERVISION AREDS 2 PO) 1 tablet, Oral, 2 times daily   pantoprazole (PROTONIX) 40 mg, Oral, Daily   PHENobarbital (LUMINAL) 64.8 mg, Oral, 2 times daily   polyethylene glycol (MIRALAX / GLYCOLAX) 17 g, Oral, 2 times daily   pravastatin (PRAVACHOL) 40 MG tablet Every evening   sacubitril-valsartan (ENTRESTO) 24-26 MG 1 tablet, Oral, 2 times daily   spironolactone (ALDACTONE) 12.5 mg, Oral, Daily   Tiotropium Bromide Monohydrate (SPIRIVA RESPIMAT) 2.5 MCG/ACT AERS 2 each, Inhalation, Every evening   torsemide (DEMADEX) 40 mg, Oral, Daily, MAy take an additional 20 mg  or 40 mg in the afternoon if 3 lbs or 5 lbs overnight weight gain.   umeclidinium bromide (INCRUSE ELLIPTA) 62.5 MCG/ACT AEPB 1 puff, Inhalation, Daily   vitamin D3 (CHOLECALCIFEROL) 1,000 Units, Oral, Daily   warfarin (COUMADIN) 3 mg, Oral, Daily-1600    Diet Orders (From admission, onward)     Start     Ordered   02/03/22 0001  Diet NPO time specified  Diet effective midnight        02/02/22 1437            DVT prophylaxis:    Lab Results  Component Value Date   PLT 122 (L) 02/03/2022      Code Status: Full Code  Family Communication: no family at bedside  Status is: Inpatient  Remains inpatient appropriate because: Transfer to [place] today.  Heart failure consult  Level of care: ICU  Consultants:  Cardiology  Objective: Vitals:   02/03/22 0801 02/03/22 0917 02/03/22 1037 02/03/22 1050   BP: 103/82 110/87 101/76   Pulse: (!) 120  87   Resp: 18 (!) 23 20   Temp: 98.3 F (36.8 C) (!) 97.5 F (36.4 C) (!) 97.5 F (36.4 C)   TempSrc: Oral Tympanic Tympanic   SpO2: 97% 97% 90% 98%  Weight:      Height:        Intake/Output Summary (Last 24 hours) at 02/03/2022 1119 Last data filed at 02/03/2022 1029 Gross per 24 hour  Intake 1569.72 ml  Output 650 ml  Net 919.72 ml    Wt Readings from Last 3 Encounters:  02/02/22 98.5 kg  01/26/22 99.4 kg  01/08/22 97.9 kg    Examination:  Constitutional: NAD Eyes: lids and conjunctivae normal, no scleral icterus ENMT: mmm Neck: normal, supple Respiratory: Bibasilar crackles present, no wheezing Cardiovascular: Irregular, tachycardic, no appreciable murmurs.  Trace edema Abdomen: soft, no distention, no tenderness. Bowel sounds positive.  Skin: no rashes Neurologic: no focal deficits, equal strength   Data Reviewed: I have independently reviewed following labs and imaging studies   CBC Recent Labs  Lab 01/31/22 1949 02/01/22  6389 02/02/22 0319 02/03/22 0152  WBC 6.8 5.6 7.8 8.6  HGB 12.8* 12.9* 13.5 13.2  HCT 38.7* 38.8* 41.2 38.5*  PLT 126* 114* 128* 122*  MCV 92.4 92.8 93.8 90.8  MCH 30.5 30.9 30.8 31.1  MCHC 33.1 33.2 32.8 34.3  RDW 13.4 13.6 13.8 13.9     Recent Labs  Lab 01/29/22 0000 01/31/22 1949 01/31/22 2337 02/01/22 0453 02/02/22 0319 02/03/22 0152  NA  --  137  --  143 137 139  K  --  4.2  --  4.3 4.1 4.3  CL  --  104  --  105 102 103  CO2  --  23  --  25 21* 26  GLUCOSE  --  97  --  119* 133* 151*  BUN  --  50*  --  45* 45* 52*  CREATININE  --  1.92*  --  1.83* 2.28* 2.47*  CALCIUM  --  9.0  --  9.4 8.8* 9.2  AST  --  19  --   --   --  16  ALT  --  20  --   --   --  20  ALKPHOS  --  55  --   --   --  53  BILITOT  --  0.7  --   --   --  0.7  ALBUMIN  --  3.7  --   --   --  3.1*  MG  --   --   --  2.4  --  2.5*  INR 2.2  --  2.1*  --  2.3* 2.4*  BNP  --  1,144.5*  --   --   --   2,755.5*     ------------------------------------------------------------------------------------------------------------------ No results for input(s): "CHOL", "HDL", "LDLCALC", "TRIG", "CHOLHDL", "LDLDIRECT" in the last 72 hours.  Lab Results  Component Value Date   HGBA1C 5.7 (H) 09/02/2021   ------------------------------------------------------------------------------------------------------------------ No results for input(s): "TSH", "T4TOTAL", "T3FREE", "THYROIDAB" in the last 72 hours.  Invalid input(s): "FREET3"  Cardiac Enzymes No results for input(s): "CKMB", "TROPONINI", "MYOGLOBIN" in the last 168 hours.  Invalid input(s): "CK" ------------------------------------------------------------------------------------------------------------------    Component Value Date/Time   BNP 2,755.5 (H) 02/03/2022 0152    CBG: No results for input(s): "GLUCAP" in the last 168 hours.  Recent Results (from the past 240 hour(s))  Resp Panel by RT-PCR (Flu A&B, Covid) Anterior Nasal Swab     Status: None   Collection Time: 01/31/22  9:00 PM   Specimen: Anterior Nasal Swab  Result Value Ref Range Status   SARS Coronavirus 2 by RT PCR NEGATIVE NEGATIVE Final    Comment: (NOTE) SARS-CoV-2 target nucleic acids are NOT DETECTED.  The SARS-CoV-2 RNA is generally detectable in upper respiratory specimens during the acute phase of infection. The lowest concentration of SARS-CoV-2 viral copies this assay can detect is 138 copies/mL. A negative result does not preclude SARS-Cov-2 infection and should not be used as the sole basis for treatment or other patient management decisions. A negative result may occur with  improper specimen collection/handling, submission of specimen other than nasopharyngeal swab, presence of viral mutation(s) within the areas targeted by this assay, and inadequate number of viral copies(<138 copies/mL). A negative result must be combined with clinical  observations, patient history, and epidemiological information. The expected result is Negative.  Fact Sheet for Patients:  EntrepreneurPulse.com.au  Fact Sheet for Healthcare Providers:  IncredibleEmployment.be  This test is no t yet approved or cleared by the [place]  States FDA and  has been authorized for detection and/or diagnosis of SARS-CoV-2 by FDA under an Emergency Use Authorization (EUA). This EUA will remain  in effect (meaning this test can be used) for the duration of the COVID-19 declaration under Section 564(b)(1) of the Act, 21 U.S.C.section 360bbb-3(b)(1), unless the authorization is terminated  or revoked sooner.       Influenza A by PCR NEGATIVE NEGATIVE Final   Influenza B by PCR NEGATIVE NEGATIVE Final    Comment: (NOTE) The Xpert Xpress SARS-CoV-2/FLU/RSV plus assay is intended as an aid in the diagnosis of influenza from Nasopharyngeal swab specimens and should not be used as a sole basis for treatment. Nasal washings and aspirates are unacceptable for Xpert Xpress SARS-CoV-2/FLU/RSV testing.  Fact Sheet for Patients: EntrepreneurPulse.com.au  Fact Sheet for Healthcare Providers: IncredibleEmployment.be  This test is not yet approved or cleared by the [place] FDA and has been authorized for detection and/or diagnosis of SARS-CoV-2 by FDA under an Emergency Use Authorization (EUA). This EUA will remain in effect (meaning this test can be used) for the duration of the COVID-19 declaration under Section 564(b)(1) of the Act, 21 U.S.C. section 360bbb-3(b)(1), unless the authorization is terminated or revoked.  Performed at [facility], [place] [address], [place], [place] [address]      Radiology Studies: DG Chest 2 View  Result Date: 02/03/2022 CLINICAL DATA:  Shortness of breath. EXAM: CHEST - 2 VIEW COMPARISON:  01/31/2022 FINDINGS: 0853 hours. Low volume film.  Vascular congestion with diffuse interstitial opacity raises the question of edema. Left permanent pacemaker noted. Telemetry leads overlie the chest. IMPRESSION: Low volume film with vascular congestion and probable interstitial edema. Electronically Signed   By: Misty Stanley M.D.   On: 02/03/2022 09:19     Marzetta Board, MD, PhD [facility]  Between 7 am - 7 pm I am available, please contact me via Amion (for emergencies) or Securechat (non urgent messages)  Between 7 pm - 7 am I am not available, please contact night coverage MD/APP via Amion

## 2022-02-03 NOTE — Consult Note (Addendum)
Advanced Heart Failure Team Consult Note   Primary Physician: [facility] PCP-Cardiologist:  Glenetta Hew, MD  Reason for Consultation: Acute on chronic biventricular heart failure  HPI:    Ricky Hayden is seen today for evaluation of acute on chronic biventricular CHF at the request of Dr. Johnsie Cancel with [facility]. 76 y.o. 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 He was previously followed in [facility] clinic by Dr. Haroldine Laws, last seen in 2021.    EF 45-50% in 2016, 30-35% in 2017 and 2020.  CM previously felt to be d/t combination of LBBB and recurrent AF.  Admitted 4/10 to 08/09/19 with COVID PNA and HF. Echo repeated and EF back down to 20% w/ severe RV dysfunction. He recovered from [place] but unfortunately, his wife died from [place].   Underwent CRT-P in September 2021.  He was admitted to [facility] in 12/22 with a/c CHF and atrial flutter. Underwent successful DCCV. Readmitted in 04/23 with acute respiratory failure, urosepsis and proteus bacteremia. Echo during admit with EF 40-45%. Course further c/b brief PEA arrest, aspiration PNA/HCAP, and atrial flutter with RVR. Underwent successful DCCV. EF 30-35% on TEE.   Admitted again in June 2023 with pyelonephritis and Jardiance stopped. Had afib with RVR but converted to SR spontaneously.   He was in atrial flutter at f/u with Afib clinic on 09/25. He was scheduled for TEE/DCCV. Weight was up 3 lb and he was instructed to increase Torsemide.  He presented on 01/31/22 with worsening dyspnea and lower extremity edema X 1 week. Patient found to be in acute hypoxic respiratory failure secondary to a/c CHF and AF with RVR. He was started on IV amiodarone and IV lasix. He was admitted under hospitalist service and cardiology consulted. IV lasix increased to 80 BID  d/t suboptimal diuresis. Home coreg, entresto and spiro held d/t low BP and worsening renal function. Low-dose metoprolol xl added.  Did not have robust UOP overnight despite increasing diuretics.   Seen in recovery post cardioversion. He is drowsy but easily aroused. Requesting lunch. Reports a little bit of dyspnea.    Review of Systems: [y] = yes, [ ]  = no   General: Weight gain [Y ]; Weight loss [ ] ; Anorexia [ ] ; Fatigue [ ] ; Fever [ ] ; Chills [ ] ; Weakness [ ]   Cardiac: Chest pain/pressure [ ] ; Resting SOB [Y ]; Exertional SOB [ ] ; Orthopnea [ ] ; Pedal Edema [Y]; Palpitations [ ] ; Syncope [ ] ; Presyncope [ ] ; Paroxysmal nocturnal dyspnea[ ]   Pulmonary: Cough [ ] ; Wheezing[ ] ; Hemoptysis[ ] ; Sputum [ ] ; Snoring [ ]   GI: Vomiting[ ] ; Dysphagia[ ] ; Melena[ ] ; Hematochezia [ ] ; Heartburn[ ] ; Abdominal pain [ ] ; Constipation [ ] ; Diarrhea [ ] ; BRBPR [ ]   GU: Hematuria[ ] ; Dysuria [ ] ; Nocturia[ ]   Vascular: Pain in legs with walking [ ] ; Pain in feet with lying flat [ ] ; Non-healing sores [ ] ; Stroke [ ] ; TIA [ ] ; Slurred speech [ ] ;  Neuro: Headaches[ ] ; Vertigo[ ] ; Seizures[ ] ; Paresthesias[ ] ;Blurred vision [ ] ; Diplopia [ ] ; Vision changes [ ]   Ortho/Skin: Arthritis [ ] ; Joint pain [ ] ; Muscle pain [ ] ; Joint swelling [ ] ; Back Pain [ ] ; Rash [ ]   Psych: Depression[ ] ; Anxiety[ ]   Heme: Bleeding problems [ ] ; Clotting disorders [ ] ; Anemia [ ]   Endocrine: Diabetes [Y]; Thyroid dysfunction[ ]   Home Medications Prior to Admission medications   Medication Sig Start Date End Date Taking? Authorizing Provider  acetaminophen (TYLENOL) 500 MG tablet Take 1,000 mg by mouth every 6 (six) hours as needed for moderate pain.   Yes [provider]  albuterol (PROVENTIL HFA;VENTOLIN HFA) 108 (90 BASE) MCG/ACT inhaler Inhale 2 puffs into the lungs every 6 (six) hours as needed for wheezing. 12/10/14  Yes Maryellen Pile, MD  amiodarone (PACERONE) 200 MG tablet Take one tablet by mouth in the  evening 01/26/22  Yes Fenton, Clint R, PA  amoxicillin (AMOXIL) 500 MG capsule Take 500 mg by mouth 3 (three) times daily. 01/24/22  Yes [provider]  carvedilol (COREG) 12.5 MG tablet Take 0.5 tablets (6.25 mg total) by mouth 2 (two) times daily with a meal. 01/26/22  Yes Fenton, Clint R, PA  finasteride (PROSCAR) 5 MG tablet TAKE 1/2 TABLET BY MOUTH DAILY 11/28/21  Yes Jennye Boroughs, MD  fluticasone-salmeterol [facility] INHUB) 250-50 MCG/ACT AEPB Inhale 1 puff into the lungs in the morning and at bedtime.   Yes [provider]  Melatonin 10 MG CAPS Take 10 mg by mouth at bedtime as needed (sleep).   Yes [provider]  Multiple Vitamins-Minerals (PRESERVISION AREDS 2 PO) Take 1 tablet by mouth in the morning and at bedtime.   Yes [provider]  pantoprazole (PROTONIX) 40 MG tablet Take 1 tablet (40 mg total) by mouth daily. 10/10/21  Yes Love, Ivan Anchors, PA-C  PHENobarbital (LUMINAL) 64.8 MG tablet Take 1 tablet (64.8 mg total) by mouth 2 (two) times daily. 07/24/15  Yes Corky Sox, MD  polyethylene glycol (MIRALAX / GLYCOLAX) 17 g packet Take 17 g by mouth 2 (two) times daily. Patient taking differently: Take 17 g by mouth daily as needed for moderate constipation. 10/10/21  Yes Love, Ivan Anchors, PA-C  pravastatin (PRAVACHOL) 40 MG tablet TAKE 1 TABLET BY MOUTH EVERY DAY IN THE EVENING 11/28/21  Yes Jennye Boroughs, MD  sacubitril-valsartan (ENTRESTO) 24-26 MG Take 1 tablet by mouth 2 (two) times daily. 10/10/21  Yes Love, Ivan Anchors, PA-C  spironolactone (ALDACTONE) 25 MG tablet Take 0.5 tablets (12.5 mg total) by mouth daily. 10/20/21  Yes Leonie Man, MD  Tiotropium Bromide Monohydrate (SPIRIVA RESPIMAT) 2.5 MCG/ACT AERS Inhale 2 each into the lungs every evening.   Yes [provider]  torsemide (DEMADEX) 20 MG tablet Take 2 tablets (40 mg total) by mouth daily. MAy take an additional 20 mg  or 40 mg in the afternoon if 3 lbs or 5 lbs overnight  weight gain. Patient taking differently: Take 40 mg by mouth in the morning. 12/09/21  Yes Cleaver, Jossie Ng, NP  Vitamin D3 (VITAMIN D) 25 MCG tablet Take 1 tablet (1,000 Units total) by mouth daily. 10/10/21  Yes Love, Ivan Anchors, PA-C  warfarin (COUMADIN) 3 MG tablet Take 1 tablet (3 mg total) by mouth daily at 4 PM. Patient taking differently: Take 3-4.5 mg by mouth See admin instructions. Take 1 tablet by mouth every evening, except on Saturday take one and one half tablets or as directed by Coumadin clinic 12/09/21  Yes Cleaver, Jossie Ng, NP  umeclidinium bromide (INCRUSE ELLIPTA) 62.5 MCG/ACT AEPB Inhale 1 puff into the lungs daily. Patient not taking: Reported on 01/29/2022 10/10/21   Love, Pecolia Ades    Past Medical History: Past Medical History:  Diagnosis Date   Arthritis    osteoarthritis of left  knee   Ascending aorta dilatation (HCC)    Balanitis    recurrent   Cardiac arrhythmia    life threatening, secondary to CCB vs b- blockers   Cardiomyopathy ([place])    Chronic joint pain    Chronic systolic CHF (congestive heart failure) (HCC)    CKD (chronic kidney disease), stage III (HCC)    COPD (chronic obstructive pulmonary disease) (HCC)    Coronary artery disease    Dilated aortic root (HCC)    Dyspnea    Enlarged prostate    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 Sherwood Gambler   Nocturia    Obesity    Pansinusitis    a.  complicated by brain abscess and bleeding requiring craniotomy in 1995.   PONV (postoperative nausea and vomiting)    Presence of permanent cardiac pacemaker    Sinusitis    s/p ethmoidectomy and nasal septoplasty   Vertigo    intermitantly    Past Surgical History: Past Surgical History:  Procedure Laterality Date   BIV UPGRADE N/A  01/29/2020   Procedure: BIV UPGRADE;  Surgeon: Evans Lance, MD;  Location: [place] [facility];  Service: Cardiovascular;  Laterality: N/A;   CARDIOVERSION N/A 09/26/2021   Procedure: CARDIOVERSION;  Surgeon: [name], MD;  Location: [place];  Service: Cardiovascular;  Laterality: N/A;   CIRCUMCISION N/A 11/20/2013   Procedure: CIRCUMCISION ADULT;  Surgeon: Claybon Jabs, MD;  Location: [facility];  Service: Urology;  Laterality: N/A;   CRANIOTOMY  1995   hematomy due to sinus infection    CYSTOSCOPY/URETEROSCOPY/HOLMIUM LASER/STENT PLACEMENT Left 12/23/2021   Procedure: LEFT URETEROSCOPY/HOLMIUM LASER LITHOTRIPSY/STENT PLACEMENT retrograde;  Surgeon: Vira Agar, MD;  Location: [facility];  Service: Urology;  Laterality: Left;  90 MINUTES NEEDED FOR CASE   PACEMAKER IMPLANT N/A 01/17/2020   Procedure: PACEMAKER IMPLANT;  Surgeon: Deboraha Sprang, MD;  Location: [place] [facility];  Service: Cardiovascular;  Laterality: N/A;   RIGHT/LEFT HEART CATH AND CORONARY ANGIOGRAPHY N/A 09/20/2019   Procedure: RIGHT/LEFT HEART CATH AND CORONARY ANGIOGRAPHY;  Surgeon: Jolaine Artist, MD;  Location: [place] [facility];  Service: Cardiovascular;  Laterality: N/A;   shoulder surg rt   1995   SINUS SURGERY WITH INSTATRAK     ethmoidectomy and nasal septum repair   TEE WITHOUT CARDIOVERSION N/A 09/26/2021   Procedure: TRANSESOPHAGEAL ECHOCARDIOGRAM (TEE);  Surgeon: [name], MD;  Location: [facility];  Service: Cardiovascular;  Laterality: N/A;    Family History: Family History  Problem Relation Age of Onset   Heart failure Mother 57   Heart attack Father 76    Social History: Social History   Socioeconomic History   Marital status: Widowed    Spouse name: Not on file   Number of children: Not on file   Years of education: Not on file   Highest education level: Not on file  Occupational History   Not on file  Tobacco Use   Smoking status: Former    Types: Cigarettes     Quit date: 05/04/1986    Years since quitting: 35.7   Smokeless tobacco: Never  Vaping Use   Vaping Use: Never used  Substance and Sexual Activity   Alcohol  use: No    Alcohol/week: 0.0 standard drinks of alcohol   Drug use: No   Sexual activity: Yes  Other Topics Concern   Not on file  Social History Narrative   Not on file   Social Determinants of Health   Financial Resource Strain: Not on file  Food Insecurity: No Food Insecurity (02/01/2022)   Hunger Vital Sign    Worried About Running Out of Food in the Last Year: Never true    Ran Out of Food in the Last Year: Never true  Transportation Needs: No Transportation Needs (02/01/2022)   PRAPARE - Hydrologist (Medical): No    Lack of Transportation (Non-Medical): No  Physical Activity: Not on file  Stress: Not on file  Social Connections: Not on file    Allergies:  Allergies  Allergen Reactions   Calcium Channel Blockers Other (See Comments)    Came to hospital in 1995-caused chest pain    Jardiance [Empagliflozin] Other (See Comments)    reacted with phenobarbital (caused UTI)    Objective:    Vital Signs:   Temp:  [97.5 F (36.4 C)-98.3 F (36.8 C)] 97.5 F (36.4 C) (10/03 1037) Pulse Rate:  [87-122] 87 (10/03 1037) Resp:  [18-23] 20 (10/03 1037) BP: (101-112)/(76-88) 101/76 (10/03 1037) SpO2:  [90 %-99 %] 98 % (10/03 1050) Weight:  [98.5 kg] 98.5 kg (10/02 1200) Last BM Date : 02/02/22  Weight change: Filed Weights   02/01/22 1325 02/02/22 0245 02/02/22 1200  Weight: 100 kg 102.7 kg 98.5 kg    Intake/Output:   Intake/Output Summary (Last 24 hours) at 02/03/2022 1117 Last data filed at 02/03/2022 1029 Gross per 24 hour  Intake 1569.72 ml  Output 650 ml  Net 919.72 ml      Physical Exam    General:  Drowsy but easily aroused HEENT: normal Neck: supple. JVP ~ 10 cm with prominent v waves. Carotids 2+ bilat; no bruits.  Cor: PMI nondisplaced. Regular rate & rhythm.  No rubs, gallops or murmurs. Lungs: clear Abdomen: soft, nontender, nondistended. Extremities: no cyanosis, clubbing, rash, 1+ edema Neuro: alert & orientedx3, cranial nerves grossly intact. moves all 4 extremities w/o difficulty. Affect pleasant   Telemetry   SR 80s post cardioversion  EKG    09/30: Afib with v rate 122  Labs   Basic Metabolic Panel: Recent Labs  Lab 01/31/22 1949 02/01/22 0453 02/02/22 0319 02/03/22 0152  NA 137 143 137 139  K 4.2 4.3 4.1 4.3  CL 104 105 102 103  CO2 23 25 21* 26  GLUCOSE 97 119* 133* 151*  BUN 50* 45* 45* 52*  CREATININE 1.92* 1.83* 2.28* 2.47*  CALCIUM 9.0 9.4 8.8* 9.2  MG  --  2.4  --  2.5*    Liver Function Tests: Recent Labs  Lab 01/31/22 1949 02/03/22 0152  AST 19 16  ALT 20 20  ALKPHOS 55 53  BILITOT 0.7 0.7  PROT 7.0 7.0  ALBUMIN 3.7 3.1*   No results for input(s): "LIPASE", "AMYLASE" in the last 168 hours. No results for input(s): "AMMONIA" in the last 168 hours.  CBC: Recent Labs  Lab 01/31/22 1949 02/01/22 0453 02/02/22 0319 02/03/22 0152  WBC 6.8 5.6 7.8 8.6  HGB 12.8* 12.9* 13.5 13.2  HCT 38.7* 38.8* 41.2 38.5*  MCV 92.4 92.8 93.8 90.8  PLT 126* 114* 128* 122*    Cardiac Enzymes: No results for input(s): "CKTOTAL", "CKMB", "CKMBINDEX", "TROPONINI" in the last 168 hours.  BNP: BNP (last 3 results) Recent Labs    09/10/21 0310 01/31/22 1949 02/03/22 0152  BNP 27.6 1,144.5* 2,755.5*    ProBNP (last 3 results) No results for input(s): "PROBNP" in the last 8760 hours.   CBG: No results for input(s): "GLUCAP" in the last 168 hours.  Coagulation Studies: Recent Labs    01/31/22 07/23/35 02/02/22 0319 02/03/22 0152  LABPROT 23.7* 24.7* 26.3*  INR 2.1* 2.3* 2.4*     Imaging   DG Chest 2 View  Result Date: 02/03/2022 CLINICAL DATA:  Shortness of breath. EXAM: CHEST - 2 VIEW COMPARISON:  01/31/2022 FINDINGS: 0853 hours. Low volume film. Vascular congestion with diffuse interstitial  opacity raises the question of edema. Left permanent pacemaker noted. Telemetry leads overlie the chest. IMPRESSION: Low volume film with vascular congestion and probable interstitial edema. Electronically Signed   By: Misty Stanley M.D.   On: 02/03/2022 09:19     Medications:     Current Medications:  [MAR Hold] feeding supplement  237 mL Oral BID BM   [MAR Hold] finasteride  2.5 mg Oral Daily   influenza vaccine adjuvanted  0.5 mL Intramuscular Tomorrow-1000   [MAR Hold] metoprolol tartrate  12.5 mg Oral BID   [MAR Hold] mometasone-formoterol  2 puff Inhalation BID   [MAR Hold] pantoprazole  40 mg Oral Daily   [MAR Hold] PHENobarbital  64.8 mg Oral BID   [MAR Hold] pravastatin  40 mg Oral q1800   [MAR Hold] sodium chloride flush  3 mL Intravenous Q12H   [MAR Hold] umeclidinium bromide  1 puff Inhalation Daily   [MAR Hold] Warfarin - Pharmacist Dosing Inpatient   Does not apply q1600    Infusions:  amiodarone 30 mg/hr (02/03/22 0600)   [MAR Hold] ampicillin-sulbactam (UNASYN) IV Stopped (02/03/22 0850)      Patient Profile   76 y.o. male with hx of chronic biventricular HF/NICM, LBBB s/p CRT-P, paroxysmal atrial fibrillation/flutter, CKD, COPD, DM II.  Admitted with acute respiratory failure with hypoxia 2/2 a/c CHF, Afib with RVR and AKI.  Assessment/Plan    Acute on chronic biventricular heart failure/NICM: -cardiomyopathy dates back to at least 07/23/14. EF variable over the years. CM previously thought to be d/t LBBB +/- recurrent AF. S/p CRT-P 09/21. -Multiple admissions for a/c CHF and recurrent AF this past year -EF < 20% in 2019-07-23 . LHC with no significant CAD. -Echo 04/23: EF 40-45% -Echo 09/26/21: EF 30-35% in setting of AF -TEE this admit: EF 20-25%, RV severely reduced, severe BAE, moderate TR -BP and O2 sats dropped with anesthesia pre-TEE/cardioversion. Improved with 100 ug/min neo. Pressors now weaned off.  -Volume may be slightly up on exam. Has not responded  much to IV diuretics. JVP elevated but also has severe RV dysfunction. Hold off on further diuresis today. Plan for RHC tomorrow to better assess volume status and hemodynamics. -Check lactic acid and CMET.  -Place PICC for CVP and CO-OX monitoring. -Hold beta blocker in setting of acute exacerbation -GDMT limited by AKI and hypotension -If remains stable, may be able to transfer out of ICU to stepdown this evening  2. Atrial fibrillation/flutter with RVR: -Known history with multiple prior cardioversions -Admit with recurrent AF with RVR -S/p TEE/DCCV today -Continue IV amiodarone for now>>eventually switch back to PO -On coumadin. Not on DOAC d/t interaction with phenobarbital  3. AKI on CKD 3a: -Creatinine has very last few months, between 1.2-1.8 -Scr 1.8 on admit, up to 2.46 today with attempts to diurese. Episodes of  hypotension may also be contributing.  4. Dental infection: - On Unasyn - consider alternative given salt load. ? Augmentin - Planning for outpatient tooth exteraction  Length of Stay: 2  Nehemiah Montee N, PA-C  02/03/2022, 11:17 AM  Advanced Heart Failure Team Pager [phone] (M-F; 7a - 5p)  Please contact [facility] for night-coverage after hours (4p -7a ) and weekends on [email or web address]

## 2022-02-04 ENCOUNTER — Ambulatory Visit (HOSPITAL_COMMUNITY)
Admission: RE | Admit: 2022-02-04 | Payer: No Typology Code available for payment source | Source: Home / Self Care | Admitting: Internal Medicine

## 2022-02-04 ENCOUNTER — Encounter (HOSPITAL_COMMUNITY): Admission: RE | Payer: Self-pay | Source: Home / Self Care

## 2022-02-04 ENCOUNTER — Encounter (HOSPITAL_COMMUNITY)
Admission: EM | Disposition: A | Discharge status: Home Health | Payer: Self-pay | Source: Home / Self Care | Attending: Internal Medicine

## 2022-02-04 DIAGNOSIS — I4891 Unspecified atrial fibrillation: Secondary | ICD-10-CM | POA: Diagnosis not present

## 2022-02-04 LAB — CBC
HCT: 36.7 % — ABNORMAL LOW (ref 39.0–52.0)
Hemoglobin: 11.9 g/dL — ABNORMAL LOW (ref 13.0–17.0)
MCH: 30.4 pg (ref 26.0–34.0)
MCHC: 32.4 g/dL (ref 30.0–36.0)
MCV: 93.6 fL (ref 80.0–100.0)
Platelets: 91 10*3/uL — ABNORMAL LOW (ref 150–400)
RBC: 3.92 MIL/uL — ABNORMAL LOW (ref 4.22–5.81)
RDW: 13.7 % (ref 11.5–15.5)
WBC: 4.6 10*3/uL (ref 4.0–10.5)
nRBC: 0 % (ref 0.0–0.2)

## 2022-02-04 LAB — COOXEMETRY PANEL
Carboxyhemoglobin: 2.6 % — ABNORMAL HIGH (ref 0.5–1.5)
Methemoglobin: <0.7 % (ref 0.0–1.5)
O2 Saturation: 70.2 %
Total hemoglobin: 11.9 g/dL — ABNORMAL LOW (ref 12.0–16.0)

## 2022-02-04 LAB — COMPREHENSIVE METABOLIC PANEL
ALT: 20 U/L (ref 0–44)
AST: 17 U/L (ref 15–41)
Albumin: 2.9 g/dL — ABNORMAL LOW (ref 3.5–5.0)
Alkaline Phosphatase: 54 U/L (ref 38–126)
Anion gap: 8 (ref 5–15)
BUN: 54 mg/dL — ABNORMAL HIGH (ref 8–23)
CO2: 29 mmol/L (ref 22–32)
Calcium: 9 mg/dL (ref 8.9–10.3)
Chloride: 102 mmol/L (ref 98–111)
Creatinine, Ser: 2.25 mg/dL — ABNORMAL HIGH (ref 0.61–1.24)
GFR, Estimated: 29 mL/min — ABNORMAL LOW (ref >60)
Glucose, Bld: 109 mg/dL — ABNORMAL HIGH (ref 70–99)
Potassium: 4.2 mmol/L (ref 3.5–5.1)
Sodium: 139 mmol/L (ref 135–145)
Total Bilirubin: 0.3 mg/dL (ref 0.3–1.2)
Total Protein: 6.2 g/dL — ABNORMAL LOW (ref 6.5–8.1)

## 2022-02-04 LAB — MAGNESIUM: Magnesium: 2.5 mg/dL — ABNORMAL HIGH (ref 1.7–2.4)

## 2022-02-04 LAB — PROTIME-INR
INR: 2.6 — ABNORMAL HIGH (ref 0.8–1.2)
Prothrombin Time: 27.4 seconds — ABNORMAL HIGH (ref 11.4–15.2)

## 2022-02-04 SURGERY — CARDIOVERSION
Anesthesia: General

## 2022-02-04 SURGERY — RIGHT HEART CATH
Anesthesia: LOCAL

## 2022-02-04 MED ORDER — SODIUM CHLORIDE 0.9 % IV SOLN: INTRAVENOUS | Status: DC

## 2022-02-04 MED ORDER — WARFARIN SODIUM 3 MG PO TABS
3.0000 mg | ORAL_TABLET | Freq: Once | ORAL | Status: AC
  Administered 2022-02-04: 3 mg via ORAL
  Filled 2022-02-04: qty 1

## 2022-02-04 MED ORDER — SODIUM CHLORIDE 0.9% FLUSH
3.0000 mL | Freq: Two times a day (BID) | INTRAVENOUS | Status: DC
  Administered 2022-02-04: 3 mL via INTRAVENOUS

## 2022-02-04 MED ORDER — SODIUM CHLORIDE 0.9% FLUSH: 3.0000 mL | INTRAVENOUS | Status: DC | PRN

## 2022-02-04 MED ORDER — AMIODARONE HCL 200 MG PO TABS
200.0000 mg | ORAL_TABLET | Freq: Two times a day (BID) | ORAL | Status: DC
  Administered 2022-02-04: 200 mg via ORAL
  Filled 2022-02-04: qty 1

## 2022-02-04 MED ORDER — AMIODARONE HCL IN DEXTROSE 360-4.14 MG/200ML-% IV SOLN
30.0000 mg/h | INTRAVENOUS | Status: DC
  Administered 2022-02-04 – 2022-02-05 (×2): 30 mg/h via INTRAVENOUS
  Filled 2022-02-04: qty 200

## 2022-02-04 MED ORDER — SODIUM CHLORIDE 0.9 % IV SOLN: 250.0000 mL | INTRAVENOUS | Status: DC | PRN

## 2022-02-04 NOTE — Progress Notes (Addendum)
Advanced Heart Failure Rounding Note  PCP-Cardiologist: Glenetta Hew, MD   Subjective:     CO-OX 70% this am. Not requiring inotrope support.  CVP 8-9.   Scr improving, 2.47>2.25  Maintaining SR.   Reports intermittent shortness of breath but nothing predictable. Wants to go home soon.   Objective:   Weight Range: 101.4 kg Body mass index is 35.01 kg/m.   Vital Signs:   Temp:  [97.7 F (36.5 C)-98.5 F (36.9 C)] 97.7 F (36.5 C) (10/04 0708) Pulse Rate:  [71-127] 71 (10/04 0708) Resp:  [11-26] 17 (10/04 0708) BP: (95-143)/(64-105) 112/80 (10/04 0708) SpO2:  [90 %-100 %] 100 % (10/04 0708) Weight:  [101.2 kg-101.4 kg] 101.4 kg (10/04 0400) Last BM Date : 02/02/22  Weight change: Filed Weights   02/02/22 1200 02/03/22 2036 02/04/22 0400  Weight: 98.5 kg 101.2 kg 101.4 kg    Intake/Output:   Intake/Output Summary (Last 24 hours) at 02/04/2022 1039 Last data filed at 02/04/2022 0903 Gross per 24 hour  Intake 770.35 ml  Output 1300 ml  Net -529.65 ml      Physical Exam    General:  Sitting up in bed. No distress. HEENT: Normal Neck: Supple. JVP 8-10 cm. Carotids 2+ bilat; no bruits. Cor: PMI nondisplaced. Regular rate & rhythm. No rubs, gallops or murmurs. Lungs: Clear Abdomen: Soft, nontender, nondistended.  Extremities: No cyanosis, clubbing, rash, 1-2 + edema Neuro: Alert & orientedx3, cranial nerves grossly intact. moves all 4 extremities w/o difficulty. Affect pleasant   Telemetry   SR 70s   Labs    CBC Recent Labs    02/03/22 0152 02/04/22 0535  WBC 8.6 4.6  HGB 13.2 11.9*  HCT 38.5* 36.7*  MCV 90.8 93.6  PLT 122* 91*   Basic Metabolic Panel Recent Labs    02/03/22 0152 02/03/22 1230 02/04/22 0535  NA 139 141 139  K 4.3 4.3 4.2  CL 103 103 102  CO2 26 29 29   GLUCOSE 151* 134* 109*  BUN 52* 55* 54*  CREATININE 2.47* 2.46* 2.25*  CALCIUM 9.2 9.1 9.0  MG 2.5*  --  2.5*   Liver Function Tests Recent Labs     02/03/22 1230 02/04/22 0535  AST 18 17  ALT 19 20  ALKPHOS 50 54  BILITOT 0.7 0.3  PROT 6.7 6.2*  ALBUMIN 3.1* 2.9*   No results for input(s): "LIPASE", "AMYLASE" in the last 72 hours. Cardiac Enzymes No results for input(s): "CKTOTAL", "CKMB", "CKMBINDEX", "TROPONINI" in the last 72 hours.  BNP: BNP (last 3 results) Recent Labs    09/10/21 0310 01/31/22 1949 02/03/22 0152  BNP 27.6 1,144.5* 2,755.5*    ProBNP (last 3 results) No results for input(s): "PROBNP" in the last 8760 hours.   D-Dimer No results for input(s): "DDIMER" in the last 72 hours. Hemoglobin A1C No results for input(s): "HGBA1C" in the last 72 hours. Fasting Lipid Panel No results for input(s): "CHOL", "HDL", "LDLCALC", "TRIG", "CHOLHDL", "LDLDIRECT" in the last 72 hours. Thyroid Function Tests No results for input(s): "TSH", "T4TOTAL", "T3FREE", "THYROIDAB" in the last 72 hours.  Invalid input(s): "FREET3"  Other results:   Imaging    [place] EKG SITE RITE  Result Date: 02/03/2022 If Site Rite image not attached, placement could not be confirmed due to current cardiac rhythm.    Medications:     Scheduled Medications:  Chlorhexidine Gluconate Cloth  6 each Topical Daily   feeding supplement  237 mL Oral BID BM   finasteride  2.5 mg Oral Daily   influenza vaccine adjuvanted  0.5 mL Intramuscular Tomorrow-1000   mometasone-formoterol  2 puff Inhalation BID   pantoprazole  40 mg Oral Daily   PHENobarbital  64.8 mg Oral BID   pravastatin  40 mg Oral q1800   sodium chloride flush  10-40 mL Intracatheter Q12H   sodium chloride flush  3 mL Intravenous Q12H   sodium chloride flush  3 mL Intravenous Q12H   umeclidinium bromide  1 puff Inhalation Daily   warfarin  3 mg Oral ONCE-1600   Warfarin - Pharmacist Dosing Inpatient   Does not apply q1600    Infusions:  sodium chloride     sodium chloride 10 mL/hr at 02/04/22 0618   amiodarone 30 mg/hr (02/04/22 0305)   ampicillin-sulbactam  (UNASYN) IV 3 g (02/04/22 0932)    PRN Medications: sodium chloride, acetaminophen **OR** acetaminophen, albuterol, oxyCODONE, sodium chloride flush, sodium chloride flush    Patient Profile   76 y.o. male with hx of chronic biventricular HF/NICM, LBBB s/p CRT-P, paroxysmal atrial fibrillation/flutter, CKD, COPD, DM II.   Admitted with acute respiratory failure with hypoxia 2/2 a/c CHF, Afib with RVR and AKI.  Assessment/Plan   Acute on chronic biventricular heart failure/NICM: -cardiomyopathy dates back to at least 2016. EF variable over the years. CM previously thought to be d/t LBBB +/- recurrent AF. S/p CRT-P 09/21. -Multiple admissions for a/c CHF and recurrent AF this past year -EF < 20% in 2021 . LHC with no significant CAD. -Echo 04/23: EF 40-45% -Echo 09/26/21: EF 30-35% in setting of AF -TEE this admit: EF 20-25%, RV severely reduced, severe BAE, moderate TR -BP and O2 sats dropped with anesthesia pre-TEE/cardioversion. Briefly required pressors. Lactic acid okay. -CO-OX stable. Not requiring inotrope support. CVP 8-9. Scr improving with holding diuretic. ? If volume is as good as we can get with RV dysfunction.  -Will likely need to add back maintenance diuretic soon. Plan to restart Torsemide 40 daily tomorrow (prior home dose) -Holding beta blocker in setting of acute exacerbation. Will add toprol XL 12.5 daily.  -GDMT limited by AKI and hypotension. Eventually add back [place].    2. Atrial fibrillation/flutter with RVR: -Known history with multiple prior cardioversions -Admit with recurrent AF with RVR -S/p TEE/DCCV today -Maintaining SR. Continue IV amiodarone today. Switch to po tomorrow. -On coumadin. Dosing per Pharm D. INR 2.6 today. Not on DOAC d/t interaction with phenobarbital.   3. AKI on CKD 3a: -Creatinine has very last few months, between 1.2-1.8 -Scr 1.8 on admit, up to 2.46 with attempts to diurese. Episodes of hypotension may also be  contributing. - Scr now improving, 2.25 today    4. Dental infection: - On Unasyn - consider alternative given salt load. ? Augmentin - Planning for outpatient tooth exteraction    Reports he has been quite weak. Uses walker at home. PT/OT consult.   Anticipate may be ready for discharge in next couple of days. Has f/u scheduled in HF clinic.  Length of Stay: 3  [facility], [place], PA-C  02/04/2022, 10:39 AM  Advanced Heart Failure Team Pager [phone] (M-F; 7a - 5p)  Please contact [facility] for night-coverage after hours (5p -7a ) and weekends on [email or web address]   Patient seen and examined with the above-signed Advanced Practice Provider and/or Housestaff. I personally reviewed laboratory data, imaging studies and relevant notes. I independently examined the patient and formulated the important aspects of the plan. I have edited the note to  reflect any of my changes or salient points. I have personally discussed the plan with the patient and/or family.  Feeling better. Remains in NSR on IV amio. Denies SOB, orthopnea or PND.  Co-ox 70% CVP 8-9. SCr improving   General:  Sitting up in bed  No resp difficulty HEENT: normal Neck: supple. no JVD. Carotids 2+ bilat; no bruits. No lymphadenopathy or thryomegaly appreciated. Cor: PMI nondisplaced. Regular rate & rhythm. No rubs, gallops or murmurs. Lungs: clear Abdomen: soft, nontender, nondistended. No hepatosplenomegaly. No bruits or masses. Good bowel sounds. Extremities: no cyanosis, clubbing, rash, edema Neuro: alert & orientedx3, cranial nerves grossly intact. moves all 4 extremities w/o difficulty. Affect pleasant  Remains in NSR. Co-ox looks good. Mildly volume overloaded. Continue IV amio. Will give one dose lasix. Titrate GDMT as tolerated.   Glori Bickers, MD  3:11 PM

## 2022-02-04 NOTE — Hospital Course (Signed)
As per H&P done on admission: " Ricky Hayden is a pleasant 76 y.o. male with medical history significant for chronic systolic CHF with CRT-P, COPD, CKD 3B, and atrial fibrillation on warfarin who presents to the emergency department with fatigue and shortness of breath.  He also reports worsening bilateral lower extremity swelling. Symptoms have been present for at least a week and he denies any associated fever, productive cough, or chest pain.  He is currently on antibiotics for dental infection and reports that he is being planned for upcoming tooth extraction.  He is also planned for TEE-DC.

## 2022-02-04 NOTE — Progress Notes (Signed)
[place] for Warfarin Indication: AFib  Allergies  Allergen Reactions   Calcium Channel Blockers Other (See Comments)    Came to hospital in 1995-caused chest pain    Jardiance [Empagliflozin] Other (See Comments)    reacted with phenobarbital (caused UTI)    Patient Measurements: Height: 5\' 7"  (170.2 cm) Weight: 101.4 kg (223 lb 8.7 oz) IBW/kg (Calculated) : 66.1  Vital Signs: Temp: 97.7 F (36.5 C) (10/04 0708) Temp Source: Oral (10/04 0708) BP: 112/80 (10/04 0708) Pulse Rate: 71 (10/04 0708)  Labs: Recent Labs    02/02/22 0319 02/03/22 0152 02/03/22 1230 02/04/22 0535 02/04/22 0615  HGB 13.5 13.2  --  11.9*  --   HCT 41.2 38.5*  --  36.7*  --   PLT 128* 122*  --  91*  --   LABPROT 24.7* 26.3*  --   --  27.4*  INR 2.3* 2.4*  --   --  2.6*  CREATININE 2.28* 2.47* 2.46* 2.25*  --      Estimated Creatinine Clearance: 31.2 mL/min (A) (by C-G formula based on SCr of 2.25 mg/dL (H)).   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 Sherwood Gambler   Nocturia    Obesity    Pansinusitis    a.  complicated by brain abscess and bleeding requiring craniotomy in 1995.   PONV (postoperative  nausea and vomiting)    Presence of permanent cardiac pacemaker    Sinusitis    s/p ethmoidectomy and nasal septoplasty   Vertigo    intermitantly    Medications:  Scheduled:   Chlorhexidine Gluconate Cloth  6 each Topical Daily   feeding supplement  237 mL Oral BID BM   finasteride  2.5 mg Oral Daily   influenza vaccine adjuvanted  0.5 mL Intramuscular Tomorrow-1000   mometasone-formoterol  2 puff Inhalation BID   pantoprazole  40 mg Oral Daily   PHENobarbital  64.8 mg Oral BID   pravastatin  40 mg Oral q1800   sodium chloride flush  10-40 mL Intracatheter Q12H   sodium chloride flush  3 mL Intravenous Q12H   sodium chloride flush  3 mL Intravenous Q12H   umeclidinium bromide  1 puff Inhalation Daily   Warfarin - Pharmacist Dosing Inpatient   Does not apply q1600    Assessment: 76 years of age male with history of atrial fibrillation who presents with Afib with RVR and dental infection. Pharmacy consulted for warfarin.  INR remains therapeutic at 2.6, CBC stable. Pt s/p DCCV 10/3. IV amiodarone continues.  Home warfarin regimen: 4.5 mg  on Saturday and 3 mg all other days.   Goal of Therapy:  INR 2-3 Monitor platelets by anticoagulation protocol: Yes   Plan:  Warfarin 3mg  PO x1 tonight Daily protime  Arrie Senate, PharmD, BCPS, [facility] Clinical Pharmacist [phone] Please check AMION for all [facility] Pharmacy numbers 02/04/2022

## 2022-02-04 NOTE — Anesthesia Postprocedure Evaluation (Signed)
Anesthesia Post Note  Patient: Ricky Hayden  Procedure(s) Performed: TRANSESOPHAGEAL ECHOCARDIOGRAM (TEE) CARDIOVERSION     Patient location during evaluation: Endoscopy Anesthesia Type: MAC Level of consciousness: awake and alert Pain management: pain level controlled Vital Signs Assessment: post-procedure vital signs reviewed and stable Respiratory status: spontaneous breathing, nonlabored ventilation, respiratory function stable and patient connected to nasal cannula oxygen Cardiovascular status: stable and blood pressure returned to baseline Postop Assessment: no apparent nausea or vomiting Anesthetic complications: no   No notable events documented.  Last Vitals:  Vitals:   02/04/22 0400 02/04/22 0708  BP: 118/77 112/80  Pulse: 80 71  Resp: 20 17  Temp:  36.5 C  SpO2: 98% 100%    Last Pain:  Vitals:   02/04/22 0708  TempSrc: Oral  PainSc:                  Clive S

## 2022-02-04 NOTE — Evaluation (Signed)
Physical Therapy Evaluation Patient Details Name: Ricky Hayden MRN: [id number] DOB: October 13, 1945 Today's Date: 02/04/2022  History of Present Illness  76 y.o. male presents to [facility] hospital on 01/31/2022 with fatigue and SOB. Pt admitted for management of acute on chronic biventricular heart failure and afib with RVR.  PMH: CKD, seizures, CAD, DM, afib, HLD, renal cysts, aortic anuerysm  Clinical Impression  Pt presents to PT with deficits in functional mobility, endurance, power, strength. Pt reports weakness in BLE, especially during transfers. Pt is able to complete transfers with support of rollator, and ambulates for household distances without physical assistance. Pt will benefit from frequent mobilization in an effort to improve LE strength and endurance. PT recommends continued HHPT services in an effort to aide in a return to the pt's baseline.       Recommendations for follow up therapy are one component of a multi-disciplinary discharge planning process, led by the attending physician.  Recommendations may be updated based on patient status, additional functional criteria and insurance authorization.  Follow Up Recommendations Home health PT      Assistance Recommended at Discharge PRN  Patient can return home with the following  A little help with walking and/or transfers;A little help with bathing/dressing/bathroom    Equipment Recommendations None recommended by PT  Recommendations for Other Services       Functional Status Assessment Patient has had a recent decline in their functional status and demonstrates the ability to make significant improvements in function in a reasonable and predictable amount of time.     Precautions / Restrictions Precautions Precautions: Fall Precaution Comments: monitor SpO2 Restrictions Weight Bearing Restrictions: No      Mobility  Bed Mobility Overal bed mobility: Needs Assistance Bed Mobility: Supine to Sit     Supine to sit:  Supervision, HOB elevated          Transfers Overall transfer level: Needs assistance Equipment used: Rolling walker (2 wheels) Transfers: Sit to/from Stand Sit to Stand: Min guard                Ambulation/Gait Ambulation/Gait assistance: Supervision Gait Distance (Feet): 150 Feet Assistive device: Rollator (4 wheels) Gait Pattern/deviations: Step-through pattern Gait velocity: functional; Gait velocity interpretation: 1.31 - 2.62 ft/sec, indicative of limited community ambulator   General Gait Details: pt with steady step-through gait, at times appears to gain excess forward momentum but no LOB observed  Financial trader Rankin (Stroke Patients Only)       Balance Overall balance assessment: Needs assistance Sitting-balance support: No upper extremity supported, Feet supported Sitting balance-Leahy Scale: Good     Standing balance support: Bilateral upper extremity supported, Reliant on assistive device for balance Standing balance-Leahy Scale: Poor                               Pertinent Vitals/Pain Pain Assessment Pain Assessment: No/denies pain    Home Living Family/patient expects to be discharged to:: Private residence Living Arrangements: Alone Available Help at Discharge: Family;Available PRN/intermittently Type of Home: House Home Access: Stairs to enter Entrance Stairs-Rails: None Entrance Stairs-Number of Steps: 1+1   Home Layout: One level Home Equipment: Shower seat;Wheelchair - Publishing copy (2 wheels);Rollator (4 wheels);BSC/3in1;Adaptive equipment;Other (comment) (adjustable bed) Additional Comments: Children alternate spending the night with him.    Prior Function Prior Level of Function : Needs assist  Mobility Comments: ambulates with use of rollator ADLs Comments: assistance with transportation and grocery shopping     Hand Dominance   Dominant Hand:  Right    Extremity/Trunk Assessment   Upper Extremity Assessment Upper Extremity Assessment: Overall WFL for tasks assessed    Lower Extremity Assessment Lower Extremity Assessment: Generalized weakness    Cervical / Trunk Assessment Cervical / Trunk Assessment: Normal  Communication   Communication: No difficulties  Cognition Arousal/Alertness: Awake/alert Behavior During Therapy: WFL for tasks assessed/performed Overall Cognitive Status: Within Functional Limits for tasks assessed                                          General Comments General comments (skin integrity, edema, etc.): pt on 2L [place] at rest, weaned to room air with mobility. Pt denies DOE, sats in low 90s upon completion of walk. Pt returned to 2L [place] at end of session as sats fluctuating from high 80s to low 90s at rest    Exercises     Assessment/Plan    PT Assessment Patient needs continued PT services  PT Problem List Decreased strength;Decreased activity tolerance;Decreased balance;Decreased mobility;Cardiopulmonary status limiting activity       PT Treatment Interventions DME instruction;Gait training;Functional mobility training;Stair training;Therapeutic activities;Therapeutic exercise;Balance training;Neuromuscular re-education;Patient/family education    PT Goals (Current goals can be found in the Care Plan section)  Acute Rehab PT Goals Patient Stated Goal: to go home PT Goal Formulation: With patient Time For Goal Achievement: 02/18/22 Potential to Achieve Goals: Good    Frequency Min 3X/week     Co-evaluation               AM-PAC PT "6 Clicks" Mobility  Outcome Measure Help needed turning from your back to your side while in a flat bed without using bedrails?: None Help needed moving from lying on your back to sitting on the side of a flat bed without using bedrails?: A Little Help needed moving to and from a bed to a chair (including a wheelchair)?: A  Little Help needed standing up from a chair using your arms (e.g., wheelchair or bedside chair)?: A Little Help needed to walk in hospital room?: A Little Help needed climbing 3-5 steps with a railing? : A Little 6 Click Score: 19    End of Session Equipment Utilized During Treatment: Oxygen Activity Tolerance: Patient tolerated treatment well Patient left: in chair;with call bell/phone within reach;with chair alarm set Nurse Communication: Mobility status PT Visit Diagnosis: Other abnormalities of gait and mobility (R26.89)    Time: 1540-0867 PT Time Calculation (min) (ACUTE ONLY): 27 min   Charges:   PT Evaluation $PT Eval Low Complexity: 1 Low          Zenaida Niece, PT, DPT Acute Rehabilitation Office [phone]   Zenaida Niece 02/04/2022, 5:49 PM

## 2022-02-04 NOTE — Progress Notes (Signed)
  Progress Note Patient: Ricky Hayden YCX:[id number] DOB: 16-Sep-1945 DOA: 01/31/2022  DOS: the patient was seen and examined on 02/04/2022  Brief hospital course: As per H&P done on admission: " Ricky Hayden is a pleasant 76 y.o. male with medical history significant for chronic systolic CHF with CRT-P, COPD, CKD 3B, and atrial fibrillation on warfarin who presents to the emergency department with fatigue and shortness of breath.  He also reports worsening bilateral lower extremity swelling. Symptoms have been present for at least a week and he denies any associated fever, productive cough, or chest pain.  He is currently on antibiotics for dental infection and reports that he is being planned for upcoming tooth extraction.  He is also planned for TEE-DC.    Assessment and Plan: Acute on chronic systolic CHF, Acute hypoxic respiratory failure,  nonischemic cardiomyopathy status post CRT 2021 Presents with complaints of shortness of breath. Acute on chronic systolic CHF likely secondary to her rapid A-fib. Cardiology was consulted. Started on IV Lasix. Echocardiogram shows EF 30 to 35%. Management per cardiology advanced heart failure team. Currently has a PICC line.  Paroxysmal atrial fibrillation with RVR  Underwent cardioversion. Currently in normal sinus rhythm. Continue amiodarone and warfarin.   Acute kidney injury on chronic kidney disease stage IIIb- Baseline creatinine 1.5. Currently creatinine around 2. Patient does not see nephrology outpatient.  Would recommend referral. Consider avoiding PICC line in future if possible.   Dental infection   On antibiotics, planned for upcoming tooth extraction.  Continue Unasyn Given his cardioversion patient needs to be on anticoagulation for at least 6 weeks.  Until mid November.   COPD -  No cough or wheeze on admission. Continue inhalers.  Overall stable   AAA-  Outpatient follow-up recommended with vascular    Thrombocytopenia-chronic,  overall stable, no bleeding   Obesity, class I Body mass index is 35.01 kg/m.   Placing the patient at high risk for poor outcome.  Subjective: Continues to have shortness of breath.  No nausea or vomiting.  No chest pain.  Reports fatigue and tiredness.  Physical Exam: Vitals:   02/03/22 2300 02/04/22 0400 02/04/22 0708 02/04/22 1112  BP: 109/79 118/77 112/80 113/77  Pulse: 75 80 71 72  Resp:  20 17 20   Temp: 98.1 F (36.7 C)  97.7 F (36.5 C) 97.7 F (36.5 C)  TempSrc: Oral  Oral Oral  SpO2: 100% 98% 100% 100%  Weight:  101.4 kg    Height:       General: Appear in moderate distress; no visible Abnormal Neck Mass Or lumps, Conjunctiva normal Cardiovascular: S1 and S2 Present, aortic systolic  Murmur, Respiratory: good respiratory effort, Bilateral Air entry present and  bilateral Crackles, no wheezes Abdomen: Bowel Sound present, Non tender  Extremities: trace Pedal edema Neurology: alert and oriented to time, place, and person  Gait not checked due to patient safety concerns   Data Reviewed: I have Reviewed nursing notes, Vitals, and Lab results since pt's last encounter. Pertinent lab results CBC and BMP I have ordered test including CBC and BMP    Family Communication: No one at bedside  Disposition: Status is: Inpatient Remains inpatient appropriate because: Need for diuresis, close observation with PICC line for volume status.  Author: Berle Mull, MD 02/04/2022 7:20 PM  Please look on [email or web address] to find out who is on call.

## 2022-02-05 ENCOUNTER — Other Ambulatory Visit (HOSPITAL_COMMUNITY): Payer: Self-pay

## 2022-02-05 DIAGNOSIS — I4891 Unspecified atrial fibrillation: Secondary | ICD-10-CM | POA: Diagnosis not present

## 2022-02-05 DIAGNOSIS — I480 Paroxysmal atrial fibrillation: Secondary | ICD-10-CM | POA: Diagnosis not present

## 2022-02-05 LAB — COOXEMETRY PANEL
Carboxyhemoglobin: 2 % — ABNORMAL HIGH (ref 0.5–1.5)
Methemoglobin: 1.2 % (ref 0.0–1.5)
O2 Saturation: 66.1 %
Total hemoglobin: 11.9 g/dL — ABNORMAL LOW (ref 12.0–16.0)

## 2022-02-05 LAB — BASIC METABOLIC PANEL
Anion gap: 9 (ref 5–15)
BUN: 45 mg/dL — ABNORMAL HIGH (ref 8–23)
CO2: 28 mmol/L (ref 22–32)
Calcium: 8.8 mg/dL — ABNORMAL LOW (ref 8.9–10.3)
Chloride: 101 mmol/L (ref 98–111)
Creatinine, Ser: 2.14 mg/dL — ABNORMAL HIGH (ref 0.61–1.24)
GFR, Estimated: 31 mL/min — ABNORMAL LOW (ref >60)
Glucose, Bld: 112 mg/dL — ABNORMAL HIGH (ref 70–99)
Potassium: 4.1 mmol/L (ref 3.5–5.1)
Sodium: 138 mmol/L (ref 135–145)

## 2022-02-05 LAB — CBC
HCT: 35.2 % — ABNORMAL LOW (ref 39.0–52.0)
Hemoglobin: 11.7 g/dL — ABNORMAL LOW (ref 13.0–17.0)
MCH: 30.9 pg (ref 26.0–34.0)
MCHC: 33.2 g/dL (ref 30.0–36.0)
MCV: 92.9 fL (ref 80.0–100.0)
Platelets: 100 10*3/uL — ABNORMAL LOW (ref 150–400)
RBC: 3.79 MIL/uL — ABNORMAL LOW (ref 4.22–5.81)
RDW: 13.5 % (ref 11.5–15.5)
WBC: 4.3 10*3/uL (ref 4.0–10.5)
nRBC: 0 % (ref 0.0–0.2)

## 2022-02-05 LAB — PROTIME-INR
INR: 2.6 — ABNORMAL HIGH (ref 0.8–1.2)
Prothrombin Time: 27.5 seconds — ABNORMAL HIGH (ref 11.4–15.2)

## 2022-02-05 LAB — MAGNESIUM: Magnesium: 2.4 mg/dL (ref 1.7–2.4)

## 2022-02-05 MED ORDER — TORSEMIDE 20 MG PO TABS: 40.0000 mg | ORAL_TABLET | Freq: Every morning | ORAL | 0 refills | Status: DC

## 2022-02-05 MED ORDER — AMIODARONE HCL 200 MG PO TABS: 200.0000 mg | ORAL_TABLET | Freq: Two times a day (BID) | ORAL | 0 refills | Status: DC

## 2022-02-05 MED ORDER — AMIODARONE HCL 200 MG PO TABS
200.0000 mg | ORAL_TABLET | Freq: Two times a day (BID) | ORAL | 0 refills | Status: DC
  Filled 2022-02-05: qty 60, 30d supply, fill #0

## 2022-02-05 MED ORDER — AMOXICILLIN-POT CLAVULANATE 875-125 MG PO TABS: 1.0000 | ORAL_TABLET | Freq: Two times a day (BID) | ORAL | 0 refills | Status: DC

## 2022-02-05 MED ORDER — AMOXICILLIN-POT CLAVULANATE 875-125 MG PO TABS
1.0000 | ORAL_TABLET | Freq: Two times a day (BID) | ORAL | 0 refills | Status: AC
  Filled 2022-02-05: qty 10, 5d supply, fill #0

## 2022-02-05 MED ORDER — DAPAGLIFLOZIN PROPANEDIOL 10 MG PO TABS
10.0000 mg | ORAL_TABLET | Freq: Every day | ORAL | Status: DC
  Administered 2022-02-05: 10 mg via ORAL
  Filled 2022-02-05: qty 1

## 2022-02-05 MED ORDER — AMIODARONE HCL 200 MG PO TABS
200.0000 mg | ORAL_TABLET | Freq: Two times a day (BID) | ORAL | Status: DC
  Administered 2022-02-05: 200 mg via ORAL
  Filled 2022-02-05: qty 1

## 2022-02-05 MED ORDER — WARFARIN SODIUM 3 MG PO TABS
3.0000 mg | ORAL_TABLET | Freq: Once | ORAL | Status: AC
  Administered 2022-02-05: 3 mg via ORAL
  Filled 2022-02-05: qty 1

## 2022-02-05 MED ORDER — AMOXICILLIN-POT CLAVULANATE 875-125 MG PO TABS
1.0000 | ORAL_TABLET | Freq: Two times a day (BID) | ORAL | Status: DC
  Administered 2022-02-05: 1 via ORAL
  Filled 2022-02-05: qty 1

## 2022-02-05 MED ORDER — DAPAGLIFLOZIN PROPANEDIOL 10 MG PO TABS
10.0000 mg | ORAL_TABLET | Freq: Every day | ORAL | 0 refills | Status: DC
  Filled 2022-02-05: qty 30, 30d supply, fill #0

## 2022-02-05 MED ORDER — TORSEMIDE 20 MG PO TABS
40.0000 mg | ORAL_TABLET | Freq: Every morning | ORAL | 0 refills | Status: DC
  Filled 2022-02-05: qty 30, 15d supply, fill #0

## 2022-02-05 MED ORDER — ENSURE ENLIVE PO LIQD: 237.0000 mL | Freq: Two times a day (BID) | ORAL | 0 refills | Status: AC

## 2022-02-05 MED ORDER — DAPAGLIFLOZIN PROPANEDIOL 10 MG PO TABS: 10.0000 mg | ORAL_TABLET | Freq: Every day | ORAL | 0 refills | Status: DC

## 2022-02-05 NOTE — Evaluation (Signed)
Occupational Therapy Evaluation Patient Details Name: Ricky Hayden MRN: [id number] DOB: 31-May-1945 Today's Date: 02/05/2022   History of Present Illness 76 y.o. male presents to [facility] hospital on 01/31/2022 with fatigue and SOB. Pt admitted for management of acute on chronic biventricular heart failure and afib with RVR.  PMH: CKD, seizures, CAD, DM, afib, HLD, renal cysts, aortic anuerysm   Clinical Impression   Pt uses rollator/RW at baseline for mobility, and receives assistance for transportation and grocery shopping. Pt lives alone, but his children (has 4 children) alternate staying with him at night. Pt currently needing supervision-mod A for ADLs, supervision for bed mobility, and min guard for transfers with rollator. Pt SpO2 92% on RA post ambulation, increasing to 96% after a few minutes seated in chair. Pt presenting with impairments listed below, will follow acutely. Recommend HHOT at d/c.      Recommendations for follow up therapy are one component of a multi-disciplinary discharge planning process, led by the attending physician.  Recommendations may be updated based on patient status, additional functional criteria and insurance authorization.   Follow Up Recommendations  Home health OT    Assistance Recommended at Discharge Set up Supervision/Assistance  Patient can return home with the following A little help with walking and/or transfers;A little help with bathing/dressing/bathroom;Assistance with cooking/housework;Direct supervision/assist for medications management;Direct supervision/assist for financial management;Assist for transportation;Help with stairs or ramp for entrance    Functional Status Assessment  Patient has had a recent decline in their functional status and demonstrates the ability to make significant improvements in function in a reasonable and predictable amount of time.  Equipment Recommendations  None recommended by OT (pt has all needed DME)     Recommendations for Other Services PT consult     Precautions / Restrictions Precautions Precautions: Fall Precaution Comments: monitor SpO2 Restrictions Weight Bearing Restrictions: No      Mobility Bed Mobility Overal bed mobility: Needs Assistance Bed Mobility: Supine to Sit     Supine to sit: Supervision, HOB elevated          Transfers Overall transfer level: Needs assistance Equipment used: Rollator (4 wheels) Transfers: Sit to/from Stand Sit to Stand: Min guard                  Balance Overall balance assessment: Needs assistance Sitting-balance support: No upper extremity supported, Feet supported Sitting balance-Leahy Scale: Good     Standing balance support: Bilateral upper extremity supported, Reliant on assistive device for balance Standing balance-Leahy Scale: Poor                             ADL either performed or assessed with clinical judgement   ADL Overall ADL's : Needs assistance/impaired Eating/Feeding: Supervision/ safety   Grooming: Supervision/safety   Upper Body Bathing: Minimal assistance   Lower Body Bathing: Moderate assistance   Upper Body Dressing : Minimal assistance   Lower Body Dressing: Moderate assistance   Toilet Transfer: Supervision/safety;Rollator (4 wheels);Ambulation;Regular Toilet   Toileting- Clothing Manipulation and Hygiene: Minimal assistance       Functional mobility during ADLs: Supervision/safety;Rollator (4 wheels)       Vision   Vision Assessment?: No apparent visual deficits     Perception     Praxis      Pertinent Vitals/Pain Pain Assessment Pain Assessment: No/denies pain     Hand Dominance Right   Extremity/Trunk Assessment Upper Extremity Assessment Upper Extremity Assessment: Overall WFL for tasks assessed  Lower Extremity Assessment Lower Extremity Assessment: Generalized weakness   Cervical / Trunk Assessment Cervical / Trunk Assessment: Normal    Communication Communication Communication: No difficulties   Cognition Arousal/Alertness: Awake/alert Behavior During Therapy: WFL for tasks assessed/performed Overall Cognitive Status: Within Functional Limits for tasks assessed                                       General Comments  VSS on RA, 92% on RA post hallway ambulation returning ot 96% once seated in chair    Exercises     Shoulder Instructions      Home Living Family/patient expects to be discharged to:: Private residence Living Arrangements: Alone Available Help at Discharge: Family;Available PRN/intermittently Type of Home: House Home Access: Stairs to enter Entrance Stairs-Number of Steps: 1+1 Entrance Stairs-Rails: None Home Layout: One level     Bathroom Shower/Tub: Walk-in shower;Tub/shower unit   Bathroom Toilet: Handicapped height Bathroom Accessibility: Yes   Home Equipment: Shower seat;Wheelchair - Publishing copy (2 wheels);Rollator (4 wheels);BSC/3in1;Adaptive equipment;Other (comment)   Additional Comments: Children alternate spending the night with him; aide that comes 3x/week for 4 hours      Prior Functioning/Environment Prior Level of Function : Needs assist             Mobility Comments: ambulates with use of rollator ADLs Comments: assistance with transportation and grocery shopping        OT Problem List: Decreased strength;Decreased range of motion;Decreased activity tolerance;Impaired balance (sitting and/or standing);Decreased safety awareness      OT Treatment/Interventions: Self-care/ADL training;Therapeutic exercise;Energy conservation;DME and/or AE instruction;Therapeutic activities;Patient/family education;Balance training    OT Goals(Current goals can be found in the care plan section) Acute Rehab OT Goals Patient Stated Goal: none stated OT Goal Formulation: With patient Time For Goal Achievement: 02/19/22 Potential to Achieve Goals: Good ADL  Goals Pt Will Perform Upper Body Dressing: with modified independence;sitting Pt Will Perform Lower Body Dressing: with adaptive equipment;with supervision;sitting/lateral leans;sit to/from stand Pt Will Transfer to Toilet: with supervision;ambulating;regular height toilet Pt Will Perform Tub/Shower Transfer: Tub transfer;Shower transfer;shower seat;ambulating;with supervision;rolling walker  OT Frequency: Min 2X/week    Co-evaluation              AM-PAC OT "6 Clicks" Daily Activity     Outcome Measure Help from another person eating meals?: None Help from another person taking care of personal grooming?: A Little Help from another person toileting, which includes using toliet, bedpan, or urinal?: A Little Help from another person bathing (including washing, rinsing, drying)?: A Lot Help from another person to put on and taking off regular upper body clothing?: A Little Help from another person to put on and taking off regular lower body clothing?: A Lot 6 Click Score: 17   End of Session Equipment Utilized During Treatment: Rollator (4 wheels) Nurse Communication: Mobility status (pt's SpO2 left off due to sats in mid 90's)  Activity Tolerance: Patient tolerated treatment well Patient left: in chair;with call bell/phone within reach;with chair alarm set  OT Visit Diagnosis: Unsteadiness on feet (R26.81);Repeated falls (R29.6);Muscle weakness (generalized) (M62.81)                Time: 3614-4315 OT Time Calculation (min): 29 min Charges:  OT General Charges $OT Visit: 1 Visit OT Evaluation $OT Eval Moderate Complexity: 1 Mod OT Treatments $Therapeutic Activity: 8-22 mins  Torii Royse, OTD, OTR/L Acute Rehab [phone]  [place] 02/05/2022, 9:04 AM

## 2022-02-05 NOTE — TOC Initial Note (Signed)
Transition of Care [facility]) - Initial/Assessment Note    Patient Details  Name: Ricky Hayden MRN: [id number] Date of Birth: Jul 02, 1945  Transition of Care [facility]) CM/SW Contact:    Erenest Rasher, RN Phone Number: [phone] 02/05/2022, 3:23 PM  Clinical Narrative:                 HF TOC CM spoke to pt and niece, Larene Beach at bedside. Pt active with [facility]. Contacted [facility] rep, Amy for resumption of care. Has needed DME in the home. Pt unsure of agency for oxygen. States he receives his meds from [facility]. Requested meds come up from [place] for dc. Pt has private duty caregivers in the home for 4-8 hours per day.   Expected Discharge Plan: [place] Barriers to Discharge: No Barriers Identified   Patient Goals and CMS Choice Patient states their goals for this hospitalization and ongoing recovery are:: to return home. CMS Medicare.gov Compare Post Acute Care list provided to:: Patient Choice offered to / list presented to : Patient  Expected Discharge Plan and Services Expected Discharge Plan: [place] In-house Referral: NA Discharge Planning Services: CM Consult Post Acute Care Choice: [place] arrangements for the past 2 months: Single Family Home Expected Discharge Date: 02/05/22               DME Arranged: N/A DME Agency: NA       HH Arranged: RN, PT HH Agency: [facility] Date HH Agency Contacted: 02/05/22 Time HH Agency Contacted: 1522 Representative spoke with at [place]: Gevena Barre  Prior Living Arrangements/Services Living arrangements for the past 2 months: [place] with:: Self (Patient states his children rotate being in the home with him.) Patient language and need for interpreter reviewed:: Yes Do you feel safe going back to the place where you live?: Yes      Need for Family Participation in Patient Care: Yes (Comment) Care giver support system in place?: Yes  (comment) Current home services: DME (rolling walker, cane, BSC, wheelchair, shower chair) Criminal Activity/Legal Involvement Pertinent to Current Situation/Hospitalization: No - Comment as needed  Activities of Daily Living Home Assistive Devices/Equipment: Walker (specify type) ADL Screening (condition at time of admission) Patient's cognitive ability adequate to safely complete daily activities?: Yes Is the patient deaf or have difficulty hearing?: No Does the patient have difficulty seeing, even when wearing glasses/contacts?: No Does the patient have difficulty concentrating, remembering, or making decisions?: No Patient able to express need for assistance with ADLs?: Yes Does the patient have difficulty dressing or bathing?: No Independently performs ADLs?: Yes (appropriate for developmental age) Does the patient have difficulty walking or climbing stairs?: Yes Weakness of Legs: None Weakness of Arms/Hands: None  Permission Sought/Granted Permission sought to share information with : Case Manager, Family Supports, PCP Permission granted to share information with : Yes, Verbal Permission Granted  Share Information with NAME: Theresa Duty  Permission granted to share info w AGENCY: [place] Coordinator.  Permission granted to share info w Relationship: niece  Permission granted to share info w Contact Information: [phone]  Emotional Assessment Appearance:: Appears stated age Attitude/Demeanor/Rapport: Engaged Affect (typically observed): Accepting Orientation: : Oriented to Self, Oriented to Place, Oriented to  Time, Oriented to Situation Alcohol / Substance Use: Not Applicable Psych Involvement: No (comment)  Admission diagnosis:  Atrial fibrillation with RVR ([place]) [I48.91] Patient Active Problem List   Diagnosis  Date Noted   AKI (acute kidney injury) ([place])    Atrial fibrillation with RVR ([place]) 02/01/2022   Acute on chronic systolic CHF  (congestive heart failure) ([place]) 02/01/2022   Dental infection 02/01/2022   Acute respiratory failure with hypoxia ([place]) 02/01/2022   Atrial flutter ([employer]) 01/26/2022   Hypercoagulable state due to paroxysmal atrial fibrillation ([place]) 01/26/2022   Pyelonephritis 10/27/2021   Severe sepsis ([name]) 10/27/2021   Aortic aneurysm ([place]) 10/27/2021   Bilateral renal cysts 10/27/2021   Edema of left lower leg 10/26/2021   Encounter for therapeutic drug monitoring 10/14/2021   Biventricular ICD (implantable cardioverter-defibrillator) in place    Hypotension 09/26/2021   Debility 09/19/2021   Cardiac arrest ([place]) 09/18/2021   Delirium 09/18/2021   Morbid obesity ([place]) 09/18/2021   Nephrolithiasis 09/18/2021   Paroxysmal atrial fibrillation ([place]) 09/17/2021   History of craniotomy 09/17/2021   Seizure disorder ([place]) 09/17/2021   Normocytic anemia 09/17/2021   Pressure injury of sacral region, stage 2 ([place]) 09/17/2021   Goals of care, counseling/discussion    Type 2 diabetes mellitus with chronic kidney disease, without long-term current use of insulin ([place]) 09/14/2021   Bacteremia due to Gram-negative bacteria    Complicated UTI (urinary tract infection)    Acute on chronic respiratory failure with hypoxia and hypercapnia (HCC)    Septic shock (HCC)    Proteus (mirabilis) ([place]) as the cause of diseases classified elsewhere 09/02/2021   ARF (acute renal failure) ([place]) 08/29/2021   Presence of biventricular cardiac pacemaker 05/15/2020   PVC (premature ventricular contraction) 12/11/2019   NICM (nonischemic cardiomyopathy) ([place]) 12/11/2019   Stage 3b chronic kidney disease (CKD) ([place]) 08/12/2019   Hyperkalemia    Chronic combined systolic and diastolic CHF, NYHA class 3 ([place]) 06/10/2015   Leg swelling 07/16/2014   Preventative health care 03/07/2014   COPD (chronic obstructive pulmonary disease) ([place]) 09/11/2013   BPPV (benign paroxysmal positional vertigo) 10/05/2012   BPH (benign  prostatic hyperplasia) 01/10/2008   Gout 04/22/2006   ERECTILE DYSFUNCTION 04/22/2006   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 [place] [address] Phone: [phone] Fax: [phone]  [id number] [address] [place] [address] Phone: [phone] Fax: [phone]  [facility] [address] [place] [address] Phone: [phone] Fax: [phone]     Social Determinants of Health (SDOH) Interventions    Readmission Risk Interventions    02/02/2022    4:30 PM 09/08/2021    3:40 PM 08/18/2019   11:08 AM  Readmission Risk Prevention Plan  Transportation Screening Complete Complete Complete  PCP or Specialist Appt within 3-5 Days   Complete  HRI or [place]   Complete  Social Work Consult for [place] Planning/Counseling   Complete  Palliative Care Screening   Complete  Medication Review Press photographer) Complete Referral to Pharmacy Complete  PCP or Specialist appointment within 3-5 days of discharge Complete Not Complete   PCP/Specialist Appt Not Complete comments  Patient currently in ICU   [place] or [place] Complete Not Complete   HRI or Home Care Consult Pt Refusal Comments  CIR recommended at this  time   SW Recovery Care/Counseling Consult Complete Complete   Palliative Care Screening Not Applicable Not Applicable   Skilled Nursing Facility Not Applicable Not Applicable

## 2022-02-05 NOTE — Progress Notes (Addendum)
Advanced Heart Failure Rounding Note  PCP-Cardiologist: Glenetta Hew, MD   Subjective:   10/4 TEE/DC-CV--> SR.   Remains on amio drip at 30 mg/hr. Maintaining SR.    Wants to go home. Denies SOB.  Objective:   Weight Range: 101.2 kg Body mass index is 34.94 kg/m.   Vital Signs:   Temp:  [97.6 F (36.4 C)-98.3 F (36.8 C)] 98.3 F (36.8 C) (10/05 0738) Pulse Rate:  [72-76] 73 (10/05 0738) Resp:  [18-22] 22 (10/05 0738) BP: (107-123)/(73-77) 123/73 (10/05 0738) SpO2:  [97 %-100 %] 97 % (10/05 0738) Weight:  [101.2 kg] 101.2 kg (10/05 0108) Last BM Date : 02/04/22  Weight change: Filed Weights   02/03/22 2036 02/04/22 0400 02/05/22 0108  Weight: 101.2 kg 101.4 kg 101.2 kg    Intake/Output:   Intake/Output Summary (Last 24 hours) at 02/05/2022 0755 Last data filed at 02/05/2022 0107 Gross per 24 hour  Intake 177 ml  Output 1900 ml  Net -1723 ml     CVP 5 personally checked.  Physical Exam   General:  In bed.  No resp difficulty HEENT: normal Neck: supple. no JVD. Carotids 2+ bilat; no bruits. No lymphadenopathy or thryomegaly appreciated. Cor: PMI nondisplaced. Regular rate & rhythm. No rubs, gallops or murmurs. Lungs: clear Abdomen: soft, nontender, nondistended. No hepatosplenomegaly. No bruits or masses. Good bowel sounds. Extremities: no cyanosis, clubbing, rash, edema. RUE PICC  Neuro: alert & orientedx3, cranial nerves grossly intact. moves all 4 extremities w/o difficulty. Affect pleasant  Telemetry   SR  70s with occasional PVC. Personally checked.    Labs    CBC Recent Labs    02/04/22 0535 02/05/22 0545  WBC 4.6 4.3  HGB 11.9* 11.7*  HCT 36.7* 35.2*  MCV 93.6 92.9  PLT 91* 619*   Basic Metabolic Panel Recent Labs    02/03/22 0152 02/03/22 1230 02/04/22 0535  NA 139 141 139  K 4.3 4.3 4.2  CL 103 103 102  CO2 26 29 29   GLUCOSE 151* 134* 109*  BUN 52* 55* 54*  CREATININE 2.47* 2.46* 2.25*  CALCIUM 9.2 9.1 9.0  MG 2.5*   --  2.5*   Liver Function Tests Recent Labs    02/03/22 1230 02/04/22 0535  AST 18 17  ALT 19 20  ALKPHOS 50 54  BILITOT 0.7 0.3  PROT 6.7 6.2*  ALBUMIN 3.1* 2.9*   No results for input(s): "LIPASE", "AMYLASE" in the last 72 hours. Cardiac Enzymes No results for input(s): "CKTOTAL", "CKMB", "CKMBINDEX", "TROPONINI" in the last 72 hours.  BNP: BNP (last 3 results) Recent Labs    09/10/21 0310 01/31/22 1949 02/03/22 0152  BNP 27.6 1,144.5* 2,755.5*    ProBNP (last 3 results) No results for input(s): "PROBNP" in the last 8760 hours.   D-Dimer No results for input(s): "DDIMER" in the last 72 hours. Hemoglobin A1C No results for input(s): "HGBA1C" in the last 72 hours. Fasting Lipid Panel No results for input(s): "CHOL", "HDL", "LDLCALC", "TRIG", "CHOLHDL", "LDLDIRECT" in the last 72 hours. Thyroid Function Tests No results for input(s): "TSH", "T4TOTAL", "T3FREE", "THYROIDAB" in the last 72 hours.  Invalid input(s): "FREET3"  Other results:   Imaging    No results found.   Medications:     Scheduled Medications:  amoxicillin-clavulanate  1 tablet Oral Q12H   Chlorhexidine Gluconate Cloth  6 each Topical Daily   feeding supplement  237 mL Oral BID BM   finasteride  2.5 mg Oral Daily  influenza vaccine adjuvanted  0.5 mL Intramuscular Tomorrow-1000   mometasone-formoterol  2 puff Inhalation BID   pantoprazole  40 mg Oral Daily   PHENobarbital  64.8 mg Oral BID   pravastatin  40 mg Oral q1800   sodium chloride flush  10-40 mL Intracatheter Q12H   sodium chloride flush  3 mL Intravenous Q12H   umeclidinium bromide  1 puff Inhalation Daily   Warfarin - Pharmacist Dosing Inpatient   Does not apply q1600    Infusions:  amiodarone 30 mg/hr (02/05/22 0601)    PRN Medications: acetaminophen **OR** acetaminophen, albuterol, oxyCODONE, sodium chloride flush    Patient Profile   76 y.o. male with hx of chronic biventricular HF/NICM, LBBB s/p CRT-P,  paroxysmal atrial fibrillation/flutter, CKD, COPD, DM II.   Admitted with acute respiratory failure with hypoxia 2/2 a/c CHF, Afib with RVR and AKI.  Assessment/Plan   Acute on chronic biventricular heart failure/NICM: -cardiomyopathy dates back to at least 2016. EF variable over the years. CM previously thought to be d/t LBBB +/- recurrent AF. S/p CRT-P 09/21. -Multiple admissions for a/c CHF and recurrent AF this past year -EF < 20% in 2021 . LHC with no significant CAD. -Echo 04/23: EF 40-45% -Echo 09/26/21: EF 30-35% in setting of AF -TEE this admit: EF 20-25%, RV severely reduced, severe BAE, moderate TR -BP and O2 sats dropped with anesthesia pre-TEE/cardioversion. Briefly required pressors. Lactic acid okay. Resolved.  - CVP 5. CO-OX stable. Tomorrow start torsemide 40 mg daily. Home dose.  -GDMT limited by AKI and hypotension. Eventually add back Entresto. L  - Restart Farxiga 10 mg daily    2. Atrial fibrillation/flutter with RVR: -Known history with multiple prior cardioversions -Admit with recurrent AF with RVR -S/p TEE/DCCV --> SR  - Stop IV amio. Start amio 200 mg twice a day x 7 days then 200 mg daily.  -On coumadin. Dosing per Pharm D. INR 2.6 today. Not on DOAC d/t interaction with phenobarbital.   3. AKI on CKD 3a: -Creatinine has very last few months, between 1.2-1.8 -Scr 1.8 on admit, up to 2.46 with attempts to diurese. Episodes of hypotension may also be contributing. - BMET pending.    4. Dental infection: - On Unasyn - consider alternative given salt load. ? Augmentin - Planning for outpatient tooth exteraction  Waiting on BMET.     Has f/u scheduled in HF clinic.  Length of Stay: 4  Amy Clegg, NP  02/05/2022, 7:55 AM  Advanced Heart Failure Team Pager [phone] (M-F; 7a - 5p)  Please contact [facility] for night-coverage after hours (5p -7a ) and weekends on [email or web address]  Patient seen and examined with the above-signed Advanced Practice Provider  and/or Housestaff. I personally reviewed laboratory data, imaging studies and relevant notes. I independently examined the patient and formulated the important aspects of the plan. I have edited the note to reflect any of my changes or salient points. I have personally discussed the plan with the patient and/or family.  Doing well. Remains in NSR. Co-ox ok. CVP low but has 1+ LE edema. Renal function improving. Wants to go home  General:  Well appearing. No resp difficulty HEENT: normal Neck: supple. no JVD. Carotids 2+ bilat; no bruits. No lymphadenopathy or thryomegaly appreciated. Cor: PMI nondisplaced. Regular rate & rhythm. No rubs, gallops or murmurs. Lungs: clear Abdomen: soft, nontender, nondistended. No hepatosplenomegaly. No bruits or masses. Good bowel sounds. Extremities: no cyanosis, clubbing, rash, 1+ edema Neuro: alert & orientedx3, cranial nerves grossly  intact. moves all 4 extremities w/o difficulty. Affect pleasant  Ok for d/c today. Meds as above. See back in 1 week and restart Entresto as tolerated.   Glori Bickers, MD  4:35 PM

## 2022-02-05 NOTE — Progress Notes (Signed)
[facility] for Warfarin Indication: AFib  Allergies  Allergen Reactions   Calcium Channel Blockers Other (See Comments)    Came to hospital in 1995-caused chest pain    Jardiance [Empagliflozin] Other (See Comments)    reacted with phenobarbital (caused UTI)    Patient Measurements: Height: 5\' 7"  (170.2 cm) Weight: 101.2 kg (223 lb 1.6 oz) IBW/kg (Calculated) : 66.1  Vital Signs: Temp: 98.3 F (36.8 C) (10/05 0738) Temp Source: Oral (10/05 0738) BP: 123/73 (10/05 0738) Pulse Rate: 73 (10/05 0738)  Labs: Recent Labs    02/03/22 0152 02/03/22 1230 02/04/22 0535 02/04/22 0615 02/05/22 0545  HGB 13.2  --  11.9*  --  11.7*  HCT 38.5*  --  36.7*  --  35.2*  PLT 122*  --  91*  --  100*  LABPROT 26.3*  --   --  27.4* 27.5*  INR 2.4*  --   --  2.6* 2.6*  CREATININE 2.47* 2.46* 2.25*  --  2.14*     Estimated Creatinine Clearance: 32.8 mL/min (A) (by C-G formula based on SCr of 2.14 mg/dL (H)).   Medical History: Past Medical History:  Diagnosis Date   Arthritis    osteoarthritis of left knee   Ascending aorta dilatation (HCC)    Balanitis    recurrent   Cardiac arrhythmia    life threatening, secondary to CCB vs b- blockers   Cardiomyopathy (HCC)    Chronic joint pain    Chronic systolic CHF (congestive heart failure) (HCC)    CKD (chronic kidney disease), stage III (HCC)    COPD (chronic obstructive pulmonary disease) (HCC)    Coronary artery disease    Dilated aortic root (HCC)    Dyspnea    Enlarged prostate    Erectile dysfunction    secondary to Peyronie's disease   GERD (gastroesophageal reflux disease)    Gout    Hiatal hernia    History of kidney stones    Hyperlipidemia    Hypertension    Intracranial hematoma ([place]) 1995   history of, s/p evacuation by Dr. Sherwood Gambler   Nocturia    Obesity    Pansinusitis    a.  complicated by brain abscess and bleeding requiring craniotomy in 1995.   PONV (postoperative  nausea and vomiting)    Presence of permanent cardiac pacemaker    Sinusitis    s/p ethmoidectomy and nasal septoplasty   Vertigo    intermitantly    Medications:  Scheduled:   amiodarone  200 mg Oral BID   amoxicillin-clavulanate  1 tablet Oral Q12H   Chlorhexidine Gluconate Cloth  6 each Topical Daily   dapagliflozin propanediol  10 mg Oral Daily   feeding supplement  237 mL Oral BID BM   finasteride  2.5 mg Oral Daily   influenza vaccine adjuvanted  0.5 mL Intramuscular Tomorrow-1000   mometasone-formoterol  2 puff Inhalation BID   pantoprazole  40 mg Oral Daily   PHENobarbital  64.8 mg Oral BID   pravastatin  40 mg Oral q1800   sodium chloride flush  10-40 mL Intracatheter Q12H   sodium chloride flush  3 mL Intravenous Q12H   umeclidinium bromide  1 puff Inhalation Daily   Warfarin - Pharmacist Dosing Inpatient   Does not apply q1600    Assessment: 76 years of age male with history of atrial fibrillation who presents with Afib with RVR and dental infection. Pharmacy consulted for warfarin.  INR remains therapeutic at 2.6,  CBC stable. Pt s/p DCCV 10/3. IV amiodarone switched to PO.  Home warfarin regimen: 4.5 mg on Saturday and 3 mg all other days.   Goal of Therapy:  INR 2-3 Monitor platelets by anticoagulation protocol: Yes   Plan:  Warfarin 3mg  PO x1 tonight Daily protime  Arrie Senate, PharmD, BCPS, [facility] Clinical Pharmacist [phone] Please check AMION for all [place] numbers 02/05/2022

## 2022-02-06 ENCOUNTER — Ambulatory Visit (INDEPENDENT_AMBULATORY_CARE_PROVIDER_SITE_OTHER): Payer: No Typology Code available for payment source

## 2022-02-06 DIAGNOSIS — Z5181 Encounter for therapeutic drug level monitoring: Secondary | ICD-10-CM

## 2022-02-06 NOTE — Discharge Summary (Signed)
Physician Discharge Summary   Patient: Ricky Hayden MRN: [id number] DOB: 1946-02-11  Admit date:     01/31/2022  Discharge date: 02/05/2022  Discharge Physician: Berle Mull  PCP: [facility], [facility]  Recommendations at discharge: Follow-up with PCP in 1 week. Follow-up with cardiology as recommended. Follow-up with nephrology in 1 month with a CBC and BMP   Follow-up Information     Clinic, [name]. Schedule an appointment as soon as possible for a visit in 1 week(s).   Contact information: [name] [address] [phone]         Bensimhon, Shaune Pascal, MD. Schedule an appointment as soon as possible for a visit in 1 week(s).   Specialty: Cardiology Why: with CBC and BMP Contact information: [address] [place] [address] [phone]         nephrology. Schedule an appointment as soon as possible for a visit in 1 month(s).   Why: with CBC and [place]. Follow up.   Why: Home Health RN and Physical Therapy will resume. Contact information: [place] [address] [phone]                Discharge Diagnoses: Principal Problem:   Atrial fibrillation with RVR ([place]) Active Problems:   COPD (chronic obstructive pulmonary disease) (HCC)   Stage 3b chronic kidney disease (CKD) (HCC)   Acute on chronic systolic CHF (congestive heart failure) (HCC)   Dental infection   Acute respiratory failure with hypoxia (HCC)   AKI (acute kidney injury) [facility])  Hospital Course: As per H&P done on admission: " Ricky Hayden is a pleasant 76 y.o. male with medical history significant for chronic systolic CHF with CRT-P, COPD, CKD 3B, and atrial fibrillation on warfarin who presents to the emergency department with fatigue and shortness of breath.  He also reports worsening bilateral lower extremity swelling. Symptoms have been present for at least a  week and he denies any associated fever, productive cough, or chest pain.  He is currently on antibiotics for dental infection and reports that he is being planned for upcoming tooth extraction.  He is also planned for TEE-DC.   Assessment and Plan  Acute on chronic systolic CHF, Acute hypoxic respiratory failure,  nonischemic cardiomyopathy status post CRT 2021 Presents with complaints of shortness of breath. Acute on chronic systolic CHF likely secondary to her rapid A-fib. Cardiology was consulted. Started on IV Lasix. Echocardiogram shows EF 30 to 35%. Management per cardiology advanced heart failure team. Initiating torsemide starting 10/6.   Paroxysmal atrial fibrillation with RVR  Underwent cardioversion.  On 10/3 Currently in normal sinus rhythm. Continue amiodarone and warfarin. Next INR check next week.   Acute kidney injury on chronic kidney disease stage IIIb- Baseline creatinine 1.5. Currently creatinine around 2. I am unsure of the patient actually sees a nephrologist but the patient reports that he does see a nephrology at the [place]. Consider avoiding PICC line in future unless absolutely necessary if possible if a candidate for future HD.   Dental infection   On antibiotics, planned for upcoming tooth extraction.  Continue Unasyn Given his cardioversion patient needs to be on anticoagulation for at least 4 weeks. Until first week of November.   COPD -  No cough or wheeze on admission. Continue inhalers.  Overall stable   AAA-  Outpatient follow-up recommended with vascular   Thrombocytopenia-chronic,  overall stable, no bleeding   Obesity, class I Body mass index is 34.94 kg/m.  Placing the patient at high risk for poor outcome.  Consultants:  Advanced heart failure services  Procedures performed:  PICC line placement  DISCHARGE MEDICATION: Allergies as of 02/05/2022       Reactions   Calcium Channel Blockers Other (See Comments)   Came to hospital  in 1995-caused chest pain   Jardiance [empagliflozin] Other (See Comments)   reacted with phenobarbital (caused UTI)        Medication List     STOP taking these medications    amoxicillin 500 MG capsule Commonly known as: AMOXIL   carvedilol 12.5 MG tablet Commonly known as: COREG   Entresto 24-26 MG Generic drug: sacubitril-valsartan   spironolactone 25 MG tablet Commonly known as: ALDACTONE   umeclidinium bromide 62.5 MCG/ACT Aepb Commonly known as: INCRUSE ELLIPTA       TAKE these medications    acetaminophen 500 MG tablet Commonly known as: TYLENOL Take 1,000 mg by mouth every 6 (six) hours as needed for moderate pain.   albuterol 108 (90 Base) MCG/ACT inhaler Commonly known as: VENTOLIN HFA Inhale 2 puffs into the lungs every 6 (six) hours as needed for wheezing.   amiodarone 200 MG tablet Commonly known as: PACERONE Take 1 tablet (200 mg total) by mouth 2 (two) times daily. What changed:  how much to take how to take this when to take this additional instructions   amoxicillin-clavulanate 875-125 MG tablet Commonly known as: AUGMENTIN Take 1 tablet by mouth 2 (two) times daily for 5 days.   Farxiga 10 MG Tabs tablet Generic drug: dapagliflozin propanediol Take 1 tablet (10 mg total) by mouth daily.   feeding supplement Liqd Take 237 mLs by mouth 2 (two) times daily between meals.   finasteride 5 MG tablet Commonly known as: PROSCAR TAKE 1/2 TABLET BY MOUTH DAILY   Melatonin 10 MG Caps Take 10 mg by mouth at bedtime as needed (sleep).   pantoprazole 40 MG tablet Commonly known as: PROTONIX Take 1 tablet (40 mg total) by mouth daily.   PHENobarbital 64.8 MG tablet Commonly known as: LUMINAL Take 1 tablet (64.8 mg total) by mouth 2 (two) times daily.   polyethylene glycol 17 g packet Commonly known as: MIRALAX / GLYCOLAX Take 17 g by mouth 2 (two) times daily. What changed:  when to take this reasons to take this   pravastatin 40  MG tablet Commonly known as: PRAVACHOL TAKE 1 TABLET BY MOUTH EVERY DAY IN THE EVENING   PRESERVISION AREDS 2 PO Take 1 tablet by mouth in the morning and at bedtime.   Spiriva Respimat 2.5 MCG/ACT Aers Generic drug: Tiotropium Bromide Monohydrate Inhale 2 each into the lungs every evening.   torsemide 20 MG tablet Commonly known as: DEMADEX Take 2 tablets (40 mg total) by mouth in the morning.   vitamin D3 25 MCG tablet Commonly known as: CHOLECALCIFEROL Take 1 tablet (1,000 Units total) by mouth daily.   warfarin 3 MG tablet Commonly known as: COUMADIN Take as directed. If you are unsure how to take this medication, talk to your nurse or doctor. Original instructions: Take 1 tablet (3 mg total) by mouth daily at 4 PM. What changed:  how much to take when to take this additional instructions   Wixela Inhub 250-50 MCG/ACT Aepb Generic drug: fluticasone-salmeterol Inhale 1 puff into the lungs in the morning and at bedtime.       Disposition:  Home Diet recommendation: Cardiac diet  Discharge Exam: Vitals:   02/05/22 0420 02/05/22 0738 02/05/22 0857 02/05/22 1253  BP: 118/73 123/73  104/74  Pulse: 75 73  78  Resp: 18 (!) 22  20  Temp: 97.8 F (36.6 C) 98.3 F (36.8 C)  98.7 F (37.1 C)  TempSrc: Oral Oral  Oral  SpO2: 98% 97% 99% 94%  Weight:      Height:       General: Appear in mild distress; no visible Abnormal Neck Mass Or lumps, Conjunctiva normal Cardiovascular: S1 and S2 Present, aortic systolic  Murmur, Respiratory: good respiratory effort, Bilateral Air entry present and CTA, no Crackles, no wheezes Abdomen: Bowel Sound present, Non tender  Extremities: no Pedal edema Neurology: alert and oriented to time, place, and person  [facility] Weights   02/03/22 2036 02/04/22 0400 02/05/22 0108  Weight: 101.2 kg 101.4 kg 101.2 kg   Condition at discharge: stable  The results of significant diagnostics from this hospitalization (including imaging,  microbiology, ancillary and laboratory) are listed below for reference.   Imaging Studies: [place] EKG SITE RITE  Result Date: 02/03/2022 If Site Rite image not attached, placement could not be confirmed due to current cardiac rhythm.  ECHO TEE  Result Date: 02/03/2022    TRANSESOPHOGEAL ECHO REPORT   Patient Name:   Ricky Hayden Date of Exam: 02/03/2022 Medical Rec #:  [id number]     Height:       67.0 in Accession #:    [id number]    Weight:       217.2 lb Date of Birth:  Mar 26, 1946    BSA:          2.094 m Patient Age:    67 years      BP:           142/118 mmHg Patient Gender: M             HR:           114 bpm. Exam Location:  Inpatient Procedure: Transesophageal Echo, Color Doppler and Cardiac Doppler Indications:     I48.92* Unspecified atrial flutter  History:         Patient has prior history of Echocardiogram examinations, most                  recent 09/26/2021. CHF, COPD, Arrythmias:Atrial Fibrillation;                  Risk Factors:Diabetes and Dyslipidemia.  Sonographer:     Raquel Sarna Senior RDCS Referring Phys:  [id number] CLINT R FENTON Diagnosing Phys: Jenkins Rouge MD PROCEDURE: After discussion of the risks and benefits of a TEE, an informed consent was obtained from the patient. The transesophogeal probe was passed without difficulty through the esophogus of the patient. Sedation performed by different physician. The patient was monitored while under deep sedation. Anesthestetic sedation was provided intravenously by Anesthesiology: 79mg  of Propofol. The patient developed no complications during the procedure. A successful direct current cardioversion was performed at 200 joules with 1 attempt. IMPRESSIONS  1. CHF service contacted to see patient and transferred to [facility] Family updated.  2. No LAA thrombus found [place] x 1 200 J converted from rapid fib.flutter rate 122 to NSR with BiV pacing rate 84 bpm.  3. Patient dropped his sats mid 80's and BP 88 mmHg with propofol Improved post procedure and with  100 ug/min neo Study was performed expidesiously.  4. Left ventricular ejection fraction, by estimation, is  20 to 25%. The left ventricle has normal function. The left ventricular internal cavity size was severely dilated. Left ventricular diastolic function could not be evaluated.  5. Bi V device leads in RA/RV. Right ventricular systolic function is severely reduced. The right ventricular size is severely enlarged.  6. Left atrial size was severely dilated. No left atrial/left atrial appendage thrombus was detected.  7. Right atrial size was severely dilated.  8. The mitral valve is normal in structure. Mild mitral valve regurgitation.  9. Tricuspid valve regurgitation is moderate. 10. The aortic valve is tricuspid. Aortic valve regurgitation is not visualized. No aortic stenosis is present. FINDINGS  Left Ventricle: Left ventricular ejection fraction, by estimation, is 20 to 25%. The left ventricle has normal function. The left ventricular internal cavity size was severely dilated. Left ventricular diastolic function could not be evaluated. Right Ventricle: Bi V device leads in RA/RV. The right ventricular size is severely enlarged. Right vetricular wall thickness was not assessed. Right ventricular systolic function is severely reduced. Left Atrium: Left atrial size was severely dilated. No left atrial/left atrial appendage thrombus was detected. Right Atrium: Right atrial size was severely dilated. Pericardium: There is no evidence of pericardial effusion. Mitral Valve: The mitral valve is normal in structure. Mild mitral valve regurgitation. Tricuspid Valve: The tricuspid valve is normal in structure. Tricuspid valve regurgitation is moderate. Aortic Valve: The aortic valve is tricuspid. Aortic valve regurgitation is not visualized. No aortic stenosis is present. Pulmonic Valve: The pulmonic valve was normal in structure. Pulmonic valve regurgitation is mild. Aorta: The aortic root is normal in size and  structure. IAS/Shunts: No atrial level shunt detected by color flow Doppler. Additional Comments: Patient dropped his sats mid 80's and BP 88 mmHg with propofol Improved post procedure and with 100 ug/min neo Study was performed expidesiously. No LAA thrombus found [place] x 1 200 J converted from rapid fib.flutter rate 122 to NSR with BiV pacing rate 84 bpm. CHF service contacted to see patient and transferred to [facility] Family updated. Jenkins Rouge MD Electronically signed by Jenkins Rouge MD Signature Date/Time: 02/03/2022/11:28:16 AM    Final    DG Chest 2 View  Result Date: 02/03/2022 CLINICAL DATA:  Shortness of breath. EXAM: CHEST - 2 VIEW COMPARISON:  01/31/2022 FINDINGS: 0853 hours. Low volume film. Vascular congestion with diffuse interstitial opacity raises the question of edema. Left permanent pacemaker noted. Telemetry leads overlie the chest. IMPRESSION: Low volume film with vascular congestion and probable interstitial edema. Electronically Signed   By: Misty Stanley M.D.   On: 02/03/2022 09:19   CT CHEST ABDOMEN PELVIS W CONTRAST  Result Date: 01/31/2022 CLINICAL DATA:  Sepsis.  Atrial fibrillation with weakness. EXAM: CT CHEST, ABDOMEN, AND PELVIS WITH CONTRAST TECHNIQUE: Multidetector CT imaging of the chest, abdomen and pelvis was performed following the standard protocol during bolus administration of intravenous contrast. RADIATION DOSE REDUCTION: This exam was performed according to the departmental dose-optimization program which includes automated exposure control, adjustment of the mA and/or kV according to patient size and/or use of iterative reconstruction technique. CONTRAST:  67mL OMNIPAQUE IOHEXOL 350 MG/ML SOLN COMPARISON:  12/11/2021. FINDINGS: CT CHEST FINDINGS Cardiovascular: The heart is enlarged and there is no pericardial effusion. Pacemaker leads are present in the heart. There is atherosclerotic calcification of the aorta with mild aneurysmal dilatation of the ascending aorta  measuring 4.1 cm. The pulmonary trunk is distended suggesting underlying pulmonary artery hypertension. Mediastinum/Nodes: No mediastinal, hilar, or axillary lymphadenopathy. The thyroid gland, trachea, and  esophagus are within normal limits. Lungs/Pleura: Apical pleural scarring is noted bilaterally. Mild paraseptal and centrilobular emphysematous changes are present in the lungs. Mild atelectasis is noted bilaterally. No effusion or pneumothorax. Musculoskeletal: Pacemaker device is present in the anterior left chest. Degenerative changes are noted in the thoracic spine. A mild compression deformity is noted in the superior endplate at T3. There is bony deformity of the sternum with sclerosis, which is likely chronic. CT ABDOMEN PELVIS FINDINGS Hepatobiliary: No focal liver abnormality is seen. Mild hepatic steatosis. No gallstones, gallbladder wall thickening, or biliary dilatation. Pancreas: Unremarkable. No pancreatic ductal dilatation or surrounding inflammatory changes. Spleen: Normal in size without focal abnormality. Adrenals/Urinary Tract: The adrenal glands are within normal limits. Kidneys enhance symmetrically. Multiple renal cysts are noted bilaterally. Additional subcentimeter hypodensities are present in the kidneys which are too small to further characterize. There is a complex exophytic hypodensity in the lower pole the left kidney measuring 1.2 cm in the lower pole of the left kidney, previously characterized as hemorrhagic or proteinaceous cyst. A stable 1.2 cm exophytic hyperdense lesion in the lower pole of the left kidney. There is a nonobstructive calculus in the lower pole the left kidney. No hydroureteronephrosis bilaterally. The bladder is unremarkable. Stomach/Bowel: Stomach is within normal limits. Appendix appears normal. No evidence of bowel wall thickening, distention, or inflammatory changes. No free air or pneumatosis. Scattered diverticula are present along the colon without  evidence of diverticulitis. Vascular/Lymphatic: Aortic atherosclerosis with aneurysmal dilatation of the infrarenal abdominal aorta measuring 3.1 cm. No enlarged abdominal or pelvic lymph nodes. Reproductive: Prostate is unremarkable. Other: No abdominopelvic ascites. There is diastasis of the rectus abdominus with a umbilical hernia containing fat and nonobstructed bowel. Musculoskeletal: Degenerative changes in the lumbar spine. No acute osseous abnormality. IMPRESSION: 1. No acute process in the chest, abdomen, or pelvis. 2. 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 Recommend annual imaging followup by CTA or MRA. This recommendation follows 2010 ACCF/AHA/AATS/ACR/ASA/SCA/SCAI/SIR/STS/SVM Guidelines for the Diagnosis and Management of Patients with Thoracic Aortic Disease. Circulation. 2010; 121: B151-V616. Aortic aneurysm NOS (ICD10-I71.9) 3. Aneurysmal dilatation of the infrarenal abdominal aorta measuring 3.1 cm. Recommend follow-up ultrasound every 3 years. 4. Dilatation of the pulmonary trunk suggesting underlying pulmonary artery hypertension. 5. Remaining incidental findings as described above. Electronically Signed   By: Brett Fairy M.D.   On: 01/31/2022 23:38   DG Chest Portable 1 View  Result Date: 01/31/2022 CLINICAL DATA:  dyspnea EXAM: PORTABLE CHEST 1 VIEW COMPARISON:  November 02, 2021 FINDINGS: The cardiomediastinal silhouette is unchanged enlarged in contour.LEFT chest cardiac pacing device. Biapical pleuroparenchymal scarring. No pleural effusion. No pneumothorax. Questionable nodular opacity in the LEFT lung, likely prominent vascular shadow. Visualized abdomen is unremarkable. IMPRESSION: Similar appearance of cardiomegaly. There is a questionable nodular opacity in the LEFT perihilar lung, likely a prominent vascular shadow. Recommend repeat PA and lateral chest radiograph for improved evaluation versus CT chest with contrast. Electronically  Signed   By: Valentino Saxon M.D.   On: 01/31/2022 20:38   CUP PACEART INCLINIC DEVICE CHECK  Result Date: 01/09/2022 CRT-P device check in clinic. Normal device function. Thresholds, sensing, impedance consistent with previous measurements. In order to meet appropriate safety margins for the A pace threshold, pulse width adjusted from 0.73ms to >0.73ms under Dr. Olin Pia direction   Histograms appropriate for patient and level of activity. Reviewed AT/AF burden 2.9% and Vrate control data with Dr. Caryl Comes.  Patient follows with the [place], is on  Warfarin taking daily. Patient bi-ventricularly pacing 91% of the time. Device programmed with appropriate safety margins. Device heart failure diagnostics are within normal limits and stable over time. Estimated longevity 7 years. Patient enrolled in remote follow-up. Plan to check device remotely in 3 months and every 6 months in  office.  Dr. Caryl Comes [facility].Leticia Penna, RN   Microbiology: Results for orders placed or performed during the hospital encounter of 01/31/22  Resp Panel by RT-PCR (Flu A&B, Covid) Anterior Nasal Swab     Status: None   Collection Time: 01/31/22  9:00 PM   Specimen: Anterior Nasal Swab  Result Value Ref Range Status   SARS Coronavirus 2 by RT PCR NEGATIVE NEGATIVE Final    Comment: (NOTE) SARS-CoV-2 target nucleic acids are NOT DETECTED.  The SARS-CoV-2 RNA is generally detectable in upper respiratory specimens during the acute phase of infection. The lowest concentration of SARS-CoV-2 viral copies this assay can detect is 138 copies/mL. A negative result does not preclude SARS-Cov-2 infection and should not be used as the sole basis for treatment or other patient management decisions. A negative result may occur with  improper specimen collection/handling, submission of specimen other than nasopharyngeal swab, presence of viral mutation(s) within  the areas targeted by this assay, and inadequate number of viral copies(<138 copies/mL). A negative result must be combined with clinical observations, patient history, and epidemiological information. The expected result is Negative.  Fact Sheet for Patients:  EntrepreneurPulse.com.au  Fact Sheet for Healthcare Providers:  IncredibleEmployment.be  This test is no t yet approved or cleared by the [place] FDA and  has been authorized for detection and/or diagnosis of SARS-CoV-2 by FDA under an Emergency Use Authorization (EUA). This EUA will remain  in effect (meaning this test can be used) for the duration of the COVID-19 declaration under Section 564(b)(1) of the Act, 21 U.S.C.section 360bbb-3(b)(1), unless the authorization is terminated  or revoked sooner.       Influenza A by PCR NEGATIVE NEGATIVE Final   Influenza B by PCR NEGATIVE NEGATIVE Final    Comment: (NOTE) The Xpert Xpress SARS-CoV-2/FLU/RSV plus assay is intended as an aid in the diagnosis of influenza from Nasopharyngeal swab specimens and should not be used as a sole basis for treatment. Nasal washings and aspirates are unacceptable for Xpert Xpress SARS-CoV-2/FLU/RSV testing.  Fact Sheet for Patients: EntrepreneurPulse.com.au  Fact Sheet for Healthcare Providers: IncredibleEmployment.be  This test is not yet approved or cleared by the [place] FDA and has been authorized for detection and/or diagnosis of SARS-CoV-2 by FDA under an Emergency Use Authorization (EUA). This EUA will remain in effect (meaning this test can be used) for the duration of the COVID-19 declaration under Section 564(b)(1) of the Act, 21 U.S.C. section 360bbb-3(b)(1), unless the authorization is terminated or revoked.  Performed at [facility], [place] [address] [place], [place] [address]    Labs: CBC: Recent Labs  Lab 02/01/22 0453  02/02/22 0319 02/03/22 0152 02/04/22 0535 02/05/22 0545  WBC 5.6 7.8 8.6 4.6 4.3  HGB 12.9* 13.5 13.2 11.9* 11.7*  HCT 38.8* 41.2 38.5* 36.7* 35.2*  MCV 92.8 93.8 90.8 93.6 92.9  PLT 114* 128* 122* 91* 480*   Basic Metabolic Panel: Recent Labs  Lab 02/01/22 0453 02/02/22 0319 02/03/22 0152 02/03/22 1230 02/04/22 0535 02/05/22 0545  NA 143 137 139 141 139 138  K 4.3 4.1 4.3 4.3 4.2 4.1  CL 105 102 103 103 102 101  CO2 25 21* 26 29 29 28   GLUCOSE 119* 133* 151* 134* 109* 112*  BUN 45* 45* 52* 55* 54* 45*  CREATININE 1.83* 2.28* 2.47* 2.46* 2.25* 2.14*  CALCIUM 9.4 8.8* 9.2 9.1 9.0 8.8*  MG 2.4  --  2.5*  --  2.5* 2.4   Liver Function Tests: Recent Labs  Lab 01/31/22 1949 02/03/22 0152 02/03/22 1230 02/04/22 0535  AST 19 16 18 17   ALT 20 20 19 20   ALKPHOS 55 53 50 54  BILITOT 0.7 0.7 0.7 0.3  PROT 7.0 7.0 6.7 6.2*  ALBUMIN 3.7 3.1* 3.1* 2.9*   CBG: No results for input(s): "GLUCAP" in the last 168 hours.  Discharge time spent: greater than 30 minutes.  Signed: Berle Mull, MD [facility] 02/05/2022

## 2022-02-06 NOTE — Patient Instructions (Signed)
Description   Spoke with Benjamine Mola with [facility]. Called pt and spoke with [name], pt's daughter. Instructed to START taking 1 tablet daily.  Recheck INR on Monday.  *10/5 Started Amio 200mg  BID x 7 days, then 200mg  daily*  Anticoagulation Clinic [phone]

## 2022-02-08 ENCOUNTER — Encounter (HOSPITAL_COMMUNITY): Payer: Self-pay | Admitting: Cardiovascular Disease

## 2022-02-09 ENCOUNTER — Ambulatory Visit (INDEPENDENT_AMBULATORY_CARE_PROVIDER_SITE_OTHER): Payer: No Typology Code available for payment source

## 2022-02-09 ENCOUNTER — Telehealth: Payer: Self-pay | Admitting: Cardiology

## 2022-02-09 DIAGNOSIS — Z5181 Encounter for therapeutic drug level monitoring: Secondary | ICD-10-CM | POA: Diagnosis not present

## 2022-02-09 LAB — POCT INR: INR: 2.5 (ref 2.0–3.0)

## 2022-02-09 NOTE — Telephone Encounter (Signed)
Calling to make provider aware of orders for pt that she is faxing over today. Please advise

## 2022-02-09 NOTE — Patient Instructions (Signed)
Description   Spoke with Lovey Newcomer with [facility]. Instructed to continue taking 1 tablet daily.  Recheck INR on 02/16/22.  *10/5 Started Amio 200mg  BID x 7 days, then 200mg  daily*  Anticoagulation Clinic [phone]

## 2022-02-09 NOTE — Telephone Encounter (Signed)
Call placed back to [facility] to let her know the message has been received and routed to nurse.

## 2022-02-10 ENCOUNTER — Ambulatory Visit: Payer: No Typology Code available for payment source | Admitting: Cardiology

## 2022-02-12 ENCOUNTER — Encounter (HOSPITAL_COMMUNITY): Payer: Self-pay

## 2022-02-12 ENCOUNTER — Ambulatory Visit (HOSPITAL_BASED_OUTPATIENT_CLINIC_OR_DEPARTMENT_OTHER)
Admit: 2022-02-12 | Discharge: 2022-02-12 | Disposition: A | Discharge status: Home / Self Care | Payer: No Typology Code available for payment source | Attending: Family Medicine | Admitting: Family Medicine

## 2022-02-12 ENCOUNTER — Ambulatory Visit (HOSPITAL_COMMUNITY)
Admission: RE | Admit: 2022-02-12 | Discharge: 2022-02-12 | Disposition: A | Discharge status: Home / Self Care | Payer: No Typology Code available for payment source | Source: Ambulatory Visit | Attending: Physician Assistant | Admitting: Physician Assistant

## 2022-02-12 ENCOUNTER — Telehealth (HOSPITAL_COMMUNITY): Payer: Self-pay

## 2022-02-12 ENCOUNTER — Encounter (HOSPITAL_COMMUNITY): Payer: Self-pay | Admitting: Physician Assistant

## 2022-02-12 VITALS — BP 146/89 | HR 89 | Wt 218.6 lb

## 2022-02-12 VITALS — BP 122/86 | HR 75 | Ht 67.0 in | Wt 219.6 lb

## 2022-02-12 DIAGNOSIS — I130 Hypertensive heart and chronic kidney disease with heart failure and stage 1 through stage 4 chronic kidney disease, or unspecified chronic kidney disease: Secondary | ICD-10-CM | POA: Insufficient documentation

## 2022-02-12 DIAGNOSIS — N1831 Chronic kidney disease, stage 3a: Secondary | ICD-10-CM | POA: Diagnosis not present

## 2022-02-12 DIAGNOSIS — E1122 Type 2 diabetes mellitus with diabetic chronic kidney disease: Secondary | ICD-10-CM | POA: Diagnosis not present

## 2022-02-12 DIAGNOSIS — E785 Hyperlipidemia, unspecified: Secondary | ICD-10-CM | POA: Insufficient documentation

## 2022-02-12 DIAGNOSIS — J440 Chronic obstructive pulmonary disease with acute lower respiratory infection: Secondary | ICD-10-CM | POA: Insufficient documentation

## 2022-02-12 DIAGNOSIS — I4819 Other persistent atrial fibrillation: Secondary | ICD-10-CM | POA: Diagnosis not present

## 2022-02-12 DIAGNOSIS — N183 Chronic kidney disease, stage 3 unspecified: Secondary | ICD-10-CM | POA: Diagnosis not present

## 2022-02-12 DIAGNOSIS — I4892 Unspecified atrial flutter: Secondary | ICD-10-CM | POA: Insufficient documentation

## 2022-02-12 DIAGNOSIS — I447 Left bundle-branch block, unspecified: Secondary | ICD-10-CM | POA: Diagnosis not present

## 2022-02-12 DIAGNOSIS — Z79899 Other long term (current) drug therapy: Secondary | ICD-10-CM | POA: Diagnosis not present

## 2022-02-12 DIAGNOSIS — G060 Intracranial abscess and granuloma: Secondary | ICD-10-CM | POA: Diagnosis not present

## 2022-02-12 DIAGNOSIS — I428 Other cardiomyopathies: Secondary | ICD-10-CM | POA: Diagnosis not present

## 2022-02-12 DIAGNOSIS — I482 Chronic atrial fibrillation, unspecified: Secondary | ICD-10-CM

## 2022-02-12 DIAGNOSIS — D6869 Other thrombophilia: Secondary | ICD-10-CM | POA: Diagnosis not present

## 2022-02-12 DIAGNOSIS — Z7901 Long term (current) use of anticoagulants: Secondary | ICD-10-CM | POA: Diagnosis not present

## 2022-02-12 DIAGNOSIS — I5082 Biventricular heart failure: Secondary | ICD-10-CM | POA: Insufficient documentation

## 2022-02-12 DIAGNOSIS — Z7984 Long term (current) use of oral hypoglycemic drugs: Secondary | ICD-10-CM | POA: Insufficient documentation

## 2022-02-12 DIAGNOSIS — I469 Cardiac arrest, cause unspecified: Secondary | ICD-10-CM | POA: Insufficient documentation

## 2022-02-12 DIAGNOSIS — I5022 Chronic systolic (congestive) heart failure: Secondary | ICD-10-CM | POA: Diagnosis not present

## 2022-02-12 DIAGNOSIS — R0602 Shortness of breath: Secondary | ICD-10-CM | POA: Insufficient documentation

## 2022-02-12 DIAGNOSIS — Z8661 Personal history of infections of the central nervous system: Secondary | ICD-10-CM | POA: Insufficient documentation

## 2022-02-12 DIAGNOSIS — I480 Paroxysmal atrial fibrillation: Secondary | ICD-10-CM | POA: Diagnosis not present

## 2022-02-12 DIAGNOSIS — Z8616 Personal history of COVID-19: Secondary | ICD-10-CM | POA: Diagnosis not present

## 2022-02-12 DIAGNOSIS — Z9889 Other specified postprocedural states: Secondary | ICD-10-CM | POA: Insufficient documentation

## 2022-02-12 LAB — BASIC METABOLIC PANEL
Anion gap: 6 (ref 5–15)
BUN: 32 mg/dL — ABNORMAL HIGH (ref 8–23)
CO2: 29 mmol/L (ref 22–32)
Calcium: 9.6 mg/dL (ref 8.9–10.3)
Chloride: 107 mmol/L (ref 98–111)
Creatinine, Ser: 1.86 mg/dL — ABNORMAL HIGH (ref 0.61–1.24)
GFR, Estimated: 37 mL/min — ABNORMAL LOW (ref >60)
Glucose, Bld: 87 mg/dL (ref 70–99)
Potassium: 5 mmol/L (ref 3.5–5.1)
Sodium: 142 mmol/L (ref 135–145)

## 2022-02-12 LAB — BRAIN NATRIURETIC PEPTIDE: B Natriuretic Peptide: 382.2 pg/mL — ABNORMAL HIGH (ref 0.0–100.0)

## 2022-02-12 MED ORDER — ENTRESTO 24-26 MG PO TABS: 1.0000 | ORAL_TABLET | Freq: Two times a day (BID) | ORAL | 11 refills | Status: DC

## 2022-02-12 MED ORDER — AMIODARONE HCL 200 MG PO TABS: 200.0000 mg | ORAL_TABLET | Freq: Every day | ORAL | 0 refills | Status: DC

## 2022-02-12 NOTE — Patient Instructions (Signed)
Decease Amiodarone 200mg  once daily

## 2022-02-12 NOTE — Addendum Note (Signed)
Encounter addended by: Rafael Bihari, FNP on: 02/12/2022 5:11 PM  Actions taken: Clinical Note Signed

## 2022-02-12 NOTE — Telephone Encounter (Signed)
Called to confirm/remind patient of their appointment at the [facility] on 02/12/22.   Patient reminded to bring all medications and/or complete list.  Confirmed patient has transportation. Gave directions, instructed to utilize valet parking.  Confirmed appointment prior to ending call.   

## 2022-02-12 NOTE — Progress Notes (Addendum)
ADVANCED HF CLINIC NOTE   Primary Care: Clinic, [facility] EP: Dr. Caryl Comes HF Cardiologist: Dr. Haroldine Laws  HPI: Ricky Hayden is a 76 y.o. 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 He was previously followed in [facility] clinic by Dr. Haroldine Laws, last seen in 2021.     EF 45-50% in 2016, 30-35% in 2017 and 2020.   CM previously felt to be d/t combination of LBBB and recurrent AF.   Admitted 4/10 to 08/09/19 with COVID PNA and HF. Echo repeated and EF back down to 20% w/ severe RV dysfunction. He recovered from [place] but unfortunately, his wife died from [place].    Underwent CRT-P in September 2021.   He was admitted to [facility] in 12/22 with a/c CHF and atrial flutter. Underwent successful DCCV. Readmitted in 04/23 with acute respiratory failure, urosepsis and proteus bacteremia. Echo during admit with EF 40-45%. Course further c/b brief PEA arrest, aspiration PNA/HCAP, and atrial flutter with RVR. Underwent successful DCCV. EF 30-35% on TEE.    Admitted again in June 2023 with pyelonephritis and Jardiance stopped. Had afib with RVR but converted to SR spontaneously.    He was in atrial flutter at f/u with Afib clinic on 01/26/22. He was scheduled for TEE/DCCV. Weight was up 3 lb and he was instructed to increase Torsemide.   Admitted 9/23 with acute hypoxic respiratory failure secondary to a/c CHF and AF with RVR. Started on IV amiodarone and IV lasix. GDMT held d/t low BP and worsening renal function.  Underwent TEE/DCCV showing EF 20-25%, RV severely reduced, moderate TR. DCCV successful to SR. Transient hypotension post TEE requiring pressors. Drips weaned and GDMT restarted, no Entresto with AKI. Discharged home, weight 223 lbs.  Today he returns for post hospital HF follow up. Overall feeling fine. He is SOB walking further  distances on flat ground. Wears 2L oxygen with activity and at night. Has left ankle swelling. Denies palpitations, abnormal bleeding, CP, dizziness, or PND/Orthopnea. Has adjustable bed.and sleeps mildly reclined. Appetite ok. No fever or chills. Weight at home 218 pounds. Taking all medications. Wants to stop Coumadin and frequent INR checks.  Cardiac Studies  - TEE (10/23): EF 20-25%, RV severely reduced, moderate TR, no LAA  - Echo (4/23): EF 40-45%, LV mildly decreased, grade I DD, RV ok  - Echo (4/21): EF 20%, severe RV dysfunction (had COVID PNA)  - Echo (2020): EF 30-35%  - Echo (2017): EF 30-35%  - Echo (2016): EF 45-50%  Past Medical History:  Diagnosis Date   Arthritis    osteoarthritis of left knee   Ascending aorta dilatation (HCC)    Balanitis    recurrent   Cardiac arrhythmia    life threatening, secondary to CCB vs b- blockers   Cardiomyopathy ([place])    Chronic joint pain    Chronic systolic CHF (congestive heart failure) (HCC)    CKD (chronic kidney disease), stage III (HCC)    COPD (chronic obstructive pulmonary disease) ([place])    Coronary artery disease    Dilated aortic root (HCC)    Dyspnea    Enlarged prostate    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 Sherwood Gambler   Nocturia    Obesity    Pansinusitis    a.  complicated by brain abscess and bleeding requiring craniotomy in 1995.   PONV (postoperative nausea and vomiting)    Presence of permanent cardiac pacemaker    Sinusitis    s/p ethmoidectomy and nasal septoplasty   Vertigo    intermitantly    Current Outpatient Medications  Medication Sig Dispense Refill   acetaminophen (TYLENOL) 500 MG tablet Take 1,000 mg by mouth every 6 (six) hours as needed for moderate pain.      albuterol (PROVENTIL HFA;VENTOLIN HFA) 108 (90 BASE) MCG/ACT inhaler Inhale 2 puffs into the lungs every 6 (six) hours as needed for wheezing. 1 Inhaler 11   amiodarone (PACERONE) 200 MG tablet Take 1 tablet (200 mg total) by mouth 2 (two) times daily. 60 tablet 0   dapagliflozin propanediol (FARXIGA) 10 MG TABS tablet Take 1 tablet (10 mg total) by mouth daily. 30 tablet 0   feeding supplement (ENSURE ENLIVE / ENSURE PLUS) LIQD Take 237 mLs by mouth 2 (two) times daily between meals. 10000 mL 0   finasteride (PROSCAR) 5 MG tablet TAKE 1/2 TABLET BY MOUTH DAILY 30 tablet 0   fluticasone-salmeterol (WIXELA INHUB) 250-50 MCG/ACT AEPB Inhale 1 puff into the lungs in the morning and at bedtime.     Melatonin 10 MG CAPS Take 10 mg by mouth at bedtime as needed (sleep).     Multiple Vitamins-Minerals (PRESERVISION AREDS 2 PO) Take 1 tablet by mouth in the morning and at bedtime.     pantoprazole (PROTONIX) 40 MG tablet Take 1 tablet (40 mg total) by mouth daily. 30 tablet 0   PHENobarbital (LUMINAL) 64.8 MG tablet Take 1 tablet (64.8 mg total) by mouth 2 (two) times daily. 180 tablet 3   polyethylene glycol (MIRALAX / GLYCOLAX) 17 g packet Take 17 g by mouth 2 (two) times daily. (Patient taking differently: Take 17 g by mouth daily as needed for moderate constipation.) 60 each 1   pravastatin (PRAVACHOL) 40 MG tablet TAKE 1 TABLET BY MOUTH EVERY DAY IN THE EVENING 90 tablet 0   Tiotropium Bromide Monohydrate (SPIRIVA RESPIMAT) 2.5 MCG/ACT AERS Inhale 2 each into the lungs every evening.     torsemide (DEMADEX) 20 MG tablet Take 2 tablets (40 mg total) by mouth in the morning. 30 tablet 0   Vitamin D3 (VITAMIN D) 25 MCG tablet Take 1 tablet (1,000 Units total) by mouth daily. 60 tablet 0   warfarin (COUMADIN) 3 MG tablet Take 1 tablet (3 mg total) by mouth daily at 4 PM. (Patient taking differently: Take 3-4.5 mg by mouth See admin instructions. Take 1 tablet by mouth every evening, except on Saturday take  one and one half tablets or as directed by Coumadin clinic) 90 tablet 1   No current facility-administered medications for this encounter.   Allergies  Allergen Reactions   Calcium Channel Blockers Other (See Comments)    Came to hospital in 1995-caused chest pain    Jardiance [Empagliflozin] Other (See Comments)    reacted with phenobarbital (caused UTI)   Social History   Socioeconomic History   Marital status: Widowed    Spouse name: Not on file   Number of children: Not on file   Years of education: Not on file   Highest education level: Not on file  Occupational History   Not on file  Tobacco Use  Smoking status: Former    Types: Cigarettes    Quit date: 05/04/1986    Years since quitting: 35.8   Smokeless tobacco: Never  Vaping Use   Vaping Use: Never used  Substance and Sexual Activity   Alcohol use: No    Alcohol/week: 0.0 standard drinks of alcohol   Drug use: No   Sexual activity: Yes  Other Topics Concern   Not on file  Social History Narrative   Not on file   Social Determinants of Health   Financial Resource Strain: Not on file  Food Insecurity: No Food Insecurity (02/01/2022)   Hunger Vital Sign    Worried About Running Out of Food in the Last Year: Never true    Ran Out of Food in the Last Year: Never true  Transportation Needs: No Transportation Needs (02/01/2022)   PRAPARE - Hydrologist (Medical): No    Lack of Transportation (Non-Medical): No  Physical Activity: Not on file  Stress: Not on file  Social Connections: Not on file  Intimate Partner Violence: Not At Risk (02/01/2022)   Humiliation, Afraid, Rape, and Kick questionnaire    Fear of Current or Ex-Partner: No    Emotionally Abused: No    Physically Abused: No    Sexually Abused: No   Family History  Problem Relation Age of Onset   Heart failure Mother 8   Heart attack Father 48   BP (!) 146/89   Pulse 89   Wt 99.2 kg (218 lb 9.6 oz)   SpO2 91%    BMI 34.24 kg/m   Wt Readings from Last 3 Encounters:  02/12/22 99.2 kg (218 lb 9.6 oz)  02/05/22 101.2 kg (223 lb 1.6 oz)  01/26/22 99.4 kg (219 lb 3.2 oz)   PHYSICAL EXAM: General:  NAD. No resp difficulty, walked into clinic with rolling walker. HEENT: Normal Neck: Supple. Thick neck. Carotids 2+ bilat; no bruits. No lymphadenopathy or thryomegaly appreciated. Cor: PMI nondisplaced. Regular rate & rhythm. No rubs, gallops or murmurs. Lungs: Clear Abdomen: Obese, nontender, nondistended. No hepatosplenomegaly. No bruits or masses. Good bowel sounds. Extremities: No cyanosis, clubbing, rash, 1+ LLE pedal edema Neuro: Alert & oriented x 3, cranial nerves grossly intact. Moves all 4 extremities w/o difficulty. Affect pleasant.  ECG: v-paced 75 bpm (personally reviewed).  Device interrogation: increased AF burden, BiV pacing down 73%, CorVue suggests fluid up  ASSESSMENT & PLAN: Chronic biventricular heart failure/NICM: - cardiomyopathy dates back to at least 2016.  - EF variable over the years. CM previously thought to be d/t LBBB +/- recurrent AF.  - S/p CRT-P (9/21). - Multiple admissions for a/c CHF and recurrent AF this past year - Echo (2021): EF < 20% . LHC with no significant CAD. - Echo (4/23): EF 40-45% - Echo (09/26/21): EF 30-35% in setting of AF - TEE (10/23): EF 20-25%, RV severely reduced, severe BAE, moderate TR - NYHA II-early III, confounded by body habitus and deconditioning. Volume OK. - Restart Entresto 24/26 mg bid. - Add spiro next. - Continue torsemide 40 mg daily. - Continue Farxiga 10 mg daily. - No beta blocker yet with recent decompensation. - Labs today.   2. Atrial fibrillation/flutter: - Known history with multiple prior cardioversions - Admit with recurrent AF with RVR - S/p TEE/DCCV (10/23)--> SR  - Remains in SR on ECG today, v-paced - Continue amiodarone taper.  - CHA2DS2-VASc score is 6 - On coumadin. Not on DOAC d/t interaction with  phenobarbital  use (see below) - INR followed by Coumadin Clinic. - He has AF clinic follow up later today. ? AF ablation vs AV node ablation candidacy.   3. CKD 3a: - baseline SCr 1.8 - Labs today.  4. H/o brain abscess - S/p craniotomy 2015 - Has been on phenobarbital since 1995, no seizures. - He wants to stop warfarin and switch to DOAC. Has not been on DOAC due to interaction w/ phenobarb. - Discuss with PharmD, Eliquis and Xarelto are contraindicated, but technically edoxaban could be used. He needs to follow up with Neuro if they are OK stopping phenobarb.   Follow up in 4 weeks with APP (add spiro) and 3 months with Dr. Haroldine Laws + echo.  Allena Katz, FNP-BC 02/12/22

## 2022-02-12 NOTE — Patient Instructions (Addendum)
RESTART Entresto 24/26 mg one tab twice a day  Labs today We will only contact you if something comes back abnormal or we need to make some changes. Otherwise no news is good news!  Please be sure to follow up with the [facility] Neurology Department regarding stopping phenobarbital   Your physician recommends that you schedule a follow-up appointment in: 4 weeks  in the Advanced Practitioners (PA/NP) Clinic and in 12 weeks with Dr Haroldine Laws and echo  Your physician has requested that you have an echocardiogram. Echocardiography is a painless test that uses sound waves to create images of your heart. It provides your doctor with information about the size and shape of your heart and how well your heart's chambers and valves are working. This procedure takes approximately one hour. There are no restrictions for this procedure.   Do the following things EVERYDAY: Weigh yourself in the morning before breakfast. Write it down and keep it in a log. Take your medicines as prescribed Eat low salt foods--Limit salt (sodium) to 2000 mg per day.  Stay as active as you can everyday Limit all fluids for the day to less than 2 liters   At the [facility], you and your health needs are our priority. As part of our continuing mission to provide you with exceptional heart care, we have created designated Provider Care Teams. These Care Teams include your primary Cardiologist (physician) and Advanced Practice Providers (APPs- Physician Assistants and Nurse Practitioners) who all work together to provide you with the care you need, when you need it.   You may see any of the following providers on your designated Care Team at your next follow up: Dr Glori Bickers Dr Loralie Champagne Dr. Roxana Hires, NP Lyda Jester, [place] [facility] [place], [place] Forestine Na, NP Audry Riles, PharmD   Please be sure to bring in all your medications bottles to every appointment.    If you have any questions or concerns before your next appointment please send [place] a message through [place] or call our office at [phone].    TO LEAVE A MESSAGE FOR THE NURSE SELECT OPTION 2, PLEASE LEAVE A MESSAGE INCLUDING: YOUR NAME DATE OF BIRTH CALL BACK NUMBER REASON FOR CALL**this is important as we prioritize the call backs  YOU WILL RECEIVE A CALL BACK THE SAME DAY AS LONG AS YOU CALL BEFORE 4:00 PM

## 2022-02-12 NOTE — Progress Notes (Signed)
Primary Care Physician: [facility] Primary Cardiologist: Dr Ellyn Hack Primary Electrophysiologist: Dr Caryl Comes [facility]: Dr Haroldine Laws  Referring Physician: [facility] Triage   Ricky Hayden is a 76 y.o. male with a history of chronic HFrEF s/p CRT-P 2021, COPD, CKD, HTN, HLD, DM, atrial fibrillation who presents for consultation in the [facility]. He was admitted to the hospital in April 2021 due to acute hypoxic respiratory failure and COPD.  EF was less than 20%.  Once the patient recovered, he underwent a left and right heart cath on 09/20/2019 that showed minimal CAD, nonischemic cardiomyopathy, mild to moderately elevated filling pressures with normal cardiac output.  Subsequent Zio patch monitor in July 2021 showed less than 1% PVCs.  Although previous office note mention CRT-D, however patient really has CRT-P instead.  His cardiomyopathy was felt to be related to bundle branch block, however it is also related to recurrent A-fib with RVR.  EF was 20 to 25% on echocardiogram in August 2021.  He underwent [employer] dual-chamber pacemaker on 01/17/2020, a left bundle area pacing lead was placed a few days later on 01/29/2020 upgrading the dual-chamber pacemaker to CRT-P. Due to the need for chronic phenobarbital, and medication to medication interaction, he is on Coumadin rather than NOAC.  He was admitted at [facility] in December 2022 due to acute CHF exacerbation and atrial flutter.  Patient had unsuccessful TEE DCCV on 12/3, he was successfully cardioverted with DCCV on 12/8 after IV diuresis.  Patient has a CHADS2VASC score of 6.  Patient was admitted back to the hospital in April 2023 with acute respiratory failure, fever and chills.  He was placed on BiPAP for shortness of breath, urinalysis was concerning for urinary tract infection.  He was treated for urosepsis with IV antibiotic.  CT of the abdomen showed a nonobstructive renal stone and exophytic lesion of the  left kidney.  He was supposed to follow-up with urology service.  Blood culture positive for Proteus and he was treated with 2 weeks of IV antibiotic.  TTE obtained on 08/29/2021 showed EF 40 to 45%.  On 09/02/2021, patient had decreased level of consciousness due to increased oxygen requirement, he required intubation and had brief PEA arrest after his intubation.  Chest x-ray concerning for aspiration with HCAP.  He then developed septic shock requiring IV fluid bolus and pressors.  Hospital course complicated by ATN secondary to urosepsis and shock.  Patient was eventually extubated 10 days later.  Hospital course was also complicated by occurrence of regular wide-complex tachycardia with heart rate up to 130.  Cardiology service consulted at the time and suspected the patient had atrial flutter with RVR.  He ultimately underwent TEE DCCV by Dr. Oval Linsey on 09/26/2021.  EF was 30 to 35% on TEE.  Previous home amiodarone was increased in dosage.    Admitted again 10/2021 with pyelonephritis, Jardiance discontinued. He was in afib with RVR but spontaneously converted to SR.   Patient's daughter reports that his heart rate went up to 120s bpm on 01/24/22. He was also diagnosed with a dental infection at the time. He was set up for outpatient DCCV but presented to the ED 01/24/22 and was admitted for afib with RVR and acute CHF. He underwent TEE guided DCCV on 02/03/22. Patient is feeling well today, remains in SR.   Today, he denies symptoms of palpitations, chest pain, shortness of breath, orthopnea, PND, lower extremity edema, dizziness, presyncope, syncope, snoring, daytime somnolence, bleeding, or neurologic  sequela. The patient is tolerating medications without difficulties and is otherwise without complaint today.    Atrial Fibrillation Risk Factors:  he does not have symptoms or diagnosis of sleep apnea. he does not have a history of rheumatic fever.   he has a BMI of Body mass index is 34.39  kg/m.Marland Kitchen Filed Weights   02/12/22 1322  Weight: 99.6 kg     Family History  Problem Relation Age of Onset   Heart failure Mother 25   Heart attack Father 69     Atrial Fibrillation Management history:  Previous antiarrhythmic drugs: amiodarone  Previous cardioversions: several, most recently 09/26/21, 02/03/22 Previous ablations: none CHADS2VASC score: 6 Anticoagulation history: warfarin    Past Medical History:  Diagnosis Date   Arthritis    osteoarthritis of left knee   Ascending aorta dilatation (HCC)    Balanitis    recurrent   Cardiac arrhythmia    life threatening, secondary to CCB vs b- blockers   Cardiomyopathy ([place])    Chronic joint pain    Chronic systolic CHF (congestive heart failure) (HCC)    CKD (chronic kidney disease), stage III (HCC)    COPD (chronic obstructive pulmonary disease) (HCC)    Coronary artery disease    Dilated aortic root (HCC)    Dyspnea    Enlarged prostate    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 Sherwood Gambler   Nocturia    Obesity    Pansinusitis    a.  complicated by brain abscess and bleeding requiring craniotomy in 1995.   PONV (postoperative nausea and vomiting)    Presence of permanent cardiac pacemaker    Sinusitis    s/p ethmoidectomy and nasal septoplasty   Vertigo    intermitantly   Past Surgical History:  Procedure Laterality Date   BIV UPGRADE N/A 01/29/2020   Procedure: BIV UPGRADE;  Surgeon: Evans Lance, MD;  Location: [facility];  Service: Cardiovascular;  Laterality: N/A;   CARDIOVERSION N/A 09/26/2021   Procedure: CARDIOVERSION;  Surgeon: [name], MD;  Location: [facility] ENDOSCOPY;  Service: Cardiovascular;  Laterality: N/A;   CARDIOVERSION N/A 02/03/2022   Procedure:  CARDIOVERSION;  Surgeon: Josue Hector, MD;  Location: [facility] ENDOSCOPY;  Service: Cardiovascular;  Laterality: N/A;   CIRCUMCISION N/A 11/20/2013   Procedure: CIRCUMCISION ADULT;  Surgeon: Claybon Jabs, MD;  Location: [facility] ORS;  Service: Urology;  Laterality: N/A;   CRANIOTOMY  1995   hematomy due to sinus infection    CYSTOSCOPY/URETEROSCOPY/HOLMIUM LASER/STENT PLACEMENT Left 12/23/2021   Procedure: LEFT URETEROSCOPY/HOLMIUM LASER LITHOTRIPSY/STENT PLACEMENT retrograde;  Surgeon: Vira Agar, MD;  Location: [facility] ORS;  Service: Urology;  Laterality: Left;  90 MINUTES NEEDED FOR CASE   PACEMAKER IMPLANT N/A 01/17/2020   Procedure: PACEMAKER IMPLANT;  Surgeon: Deboraha Sprang, MD;  Location: [facility];  Service: Cardiovascular;  Laterality: N/A;   RIGHT/LEFT HEART CATH AND CORONARY ANGIOGRAPHY N/A 09/20/2019   Procedure: RIGHT/LEFT HEART CATH AND CORONARY ANGIOGRAPHY;  Surgeon: Jolaine Artist, MD;  Location: [facility];  Service: Cardiovascular;  Laterality: N/A;   shoulder surg rt   1995   SINUS SURGERY WITH INSTATRAK     ethmoidectomy and nasal septum repair  TEE WITHOUT CARDIOVERSION N/A 09/26/2021   Procedure: TRANSESOPHAGEAL ECHOCARDIOGRAM (TEE);  Surgeon: [name], MD;  Location: [facility];  Service: Cardiovascular;  Laterality: N/A;   TEE WITHOUT CARDIOVERSION N/A 02/03/2022   Procedure: TRANSESOPHAGEAL ECHOCARDIOGRAM (TEE);  Surgeon: Josue Hector, MD;  Location: [facility] ENDOSCOPY;  Service: Cardiovascular;  Laterality: N/A;    Current Outpatient Medications  Medication Sig Dispense Refill   acetaminophen (TYLENOL) 500 MG tablet Take 1,000 mg by mouth every 6 (six) hours as needed for moderate pain.     albuterol (PROVENTIL HFA;VENTOLIN HFA) 108 (90 BASE) MCG/ACT inhaler Inhale 2 puffs into the lungs every 6 (six) hours as needed for wheezing. 1 Inhaler 11   amiodarone (PACERONE) 200 MG tablet Take 1 tablet (200 mg total) by mouth 2 (two) times daily. 60  tablet 0   dapagliflozin propanediol (FARXIGA) 10 MG TABS tablet Take 1 tablet (10 mg total) by mouth daily. 30 tablet 0   feeding supplement (ENSURE ENLIVE / ENSURE PLUS) LIQD Take 237 mLs by mouth 2 (two) times daily between meals. 10000 mL 0   finasteride (PROSCAR) 5 MG tablet TAKE 1/2 TABLET BY MOUTH DAILY 30 tablet 0   fluticasone-salmeterol (WIXELA INHUB) 250-50 MCG/ACT AEPB Inhale 1 puff into the lungs in the morning and at bedtime.     Melatonin 10 MG CAPS Take 10 mg by mouth at bedtime as needed (sleep).     Multiple Vitamins-Minerals (PRESERVISION AREDS 2 PO) Take 1 tablet by mouth in the morning and at bedtime.     pantoprazole (PROTONIX) 40 MG tablet Take 1 tablet (40 mg total) by mouth daily. 30 tablet 0   PHENobarbital (LUMINAL) 64.8 MG tablet Take 1 tablet (64.8 mg total) by mouth 2 (two) times daily. 180 tablet 3   polyethylene glycol (MIRALAX / GLYCOLAX) 17 g packet Take 17 g by mouth 2 (two) times daily. (Patient taking differently: Take 17 g by mouth daily as needed for moderate constipation.) 60 each 1   pravastatin (PRAVACHOL) 40 MG tablet TAKE 1 TABLET BY MOUTH EVERY DAY IN THE EVENING 90 tablet 0   sacubitril-valsartan (ENTRESTO) 24-26 MG Take 1 tablet by mouth 2 (two) times daily. 60 tablet 11   Tiotropium Bromide Monohydrate (SPIRIVA RESPIMAT) 2.5 MCG/ACT AERS Inhale 2 each into the lungs every evening.     torsemide (DEMADEX) 20 MG tablet Take 2 tablets (40 mg total) by mouth in the morning. 30 tablet 0   Vitamin D3 (VITAMIN D) 25 MCG tablet Take 1 tablet (1,000 Units total) by mouth daily. 60 tablet 0   warfarin (COUMADIN) 3 MG tablet Take 1 tablet (3 mg total) by mouth daily at 4 PM. (Patient taking differently: Take 3-4.5 mg by mouth See admin instructions. Take 1 tablet by mouth every evening, except on Saturday take one and one half tablets or as directed by Coumadin clinic) 90 tablet 1   No current facility-administered medications for this encounter.    Allergies   Allergen Reactions   Calcium Channel Blockers Other (See Comments)    Came to hospital in 1995-caused chest pain    Jardiance [Empagliflozin] Other (See Comments)    reacted with phenobarbital (caused UTI)    Social History   Socioeconomic History   Marital status: Widowed    Spouse name: Not on file   Number of children: Not on file   Years of education: Not on file   Highest education level: Not on file  Occupational History   Not on file  Tobacco Use   Smoking status: Former    Types: Cigarettes    Quit date: 05/04/1986    Years since quitting: 35.8   Smokeless tobacco: Never   Tobacco comments:    Former smoker 02/12/22  Vaping Use   Vaping Use: Never used  Substance and Sexual Activity   Alcohol use: Not Currently   Drug use: No   Sexual activity: Yes  Other Topics Concern   Not on file  Social History Narrative   Not on file   Social Determinants of Health   Financial Resource Strain: Not on file  Food Insecurity: No Food Insecurity (02/01/2022)   Hunger Vital Sign    Worried About Running Out of Food in the Last Year: Never true    Ran Out of Food in the Last Year: Never true  Transportation Needs: No Transportation Needs (02/01/2022)   PRAPARE - Hydrologist (Medical): No    Lack of Transportation (Non-Medical): No  Physical Activity: Not on file  Stress: Not on file  Social Connections: Not on file  Intimate Partner Violence: Not At Risk (02/01/2022)   Humiliation, Afraid, Rape, and Kick questionnaire    Fear of Current or Ex-Partner: No    Emotionally Abused: No    Physically Abused: No    Sexually Abused: No     ROS- All systems are reviewed and negative except as per the HPI above.  Physical Exam: Vitals:   02/12/22 1322  BP: 122/86  Pulse: 75  Weight: 99.6 kg  Height: 5\' 7"  (1.702 m)    GEN- The patient is a well appearing obese, elderly male, alert and oriented x 3 today.   HEENT-head normocephalic,  atraumatic, sclera clear, conjunctiva pink, hearing intact, trachea midline. Lungs- Clear to ausculation bilaterally, normal work of breathing Heart- Regular rate and rhythm, no murmurs, rubs or gallops  GI- soft, NT, ND, + BS Extremities- no clubbing, cyanosis, or edema MS- no significant deformity or atrophy Skin- no rash or lesion Psych- euthymic mood, full affect Neuro- strength and sensation are intact   Wt Readings from Last 3 Encounters:  02/12/22 99.6 kg  02/12/22 99.2 kg  02/05/22 101.2 kg    EKG today demonstrates  A sense V paced rhythm Vent. rate 75 BPM PR interval 222 ms QRS duration 114 ms QT/QTcB 432/482 ms  Echo 08/29/21 demonstrated   1. Left ventricular ejection fraction, by estimation, is 40 to 45%. The  left ventricle has mildly decreased function. The left ventricle has no  regional wall motion abnormalities. There is mild left ventricular  hypertrophy. Left ventricular diastolic parameters are consistent with Grade I diastolic dysfunction (impaired relaxation).   2. Right ventricular systolic function is normal. The right ventricular  size is normal.   3. The mitral valve is normal in structure. No evidence of mitral valve regurgitation. No evidence of mitral stenosis.   4. The aortic valve is normal in structure. There is moderate  calcification of the aortic valve. There is moderate thickening of the  aortic valve. Aortic valve regurgitation is not visualized. Aortic valve  sclerosis/calcification is present, without any   evidence of aortic stenosis.   5. Aortic dilatation noted. There is mild dilatation of the ascending  aorta, measuring 41 mm.   6. The inferior vena cava is normal in size with greater than 50%  respiratory variability, suggesting right atrial pressure of 3 mmHg.   Comparison(s): Prior images reviewed side by side. The left ventricular  function has improved.   Epic records are reviewed at length today  CHA2DS2-VASc Score = 6   The patient's score is based upon: CHF History: 1 HTN History: 1 Diabetes History: 1 Stroke History: 0 Vascular Disease History: 1 Age Score: 2 Gender Score: 0       ASSESSMENT AND PLAN: 1. Persistent Atrial Fibrillation/atrial flutter The patient's CHA2DS2-VASc score is 6, indicating a 9.7% annual risk of stroke.   S/p TEE/DCCV on 02/03/22 We discussed rhythm control options today including changing AAD to dofetilide. This would require a 3 month washout of amiodarone. Question if he would be a candidate for afib ablation or AV node ablation with his other comorbidities. Will refer back to Dr Caryl Comes to discuss.  Continue amiodarone 200 mg daily (decreased from BID today) Continue warfarin  2. Secondary Hypercoagulable State (ICD10:  D68.69) The patient is at significant risk for stroke/thromboembolism based upon his CHA2DS2-VASc Score of 6.  Continue Warfarin (Coumadin).   3. Obesity Body mass index is 34.39 kg/m. Lifestyle modification was discussed and encouraged including regular physical activity and weight reduction.  4. HTN Stable, no changes today.  5. Chronic HFrEF S/p CRT-P, followed by Dr Caryl Comes and the device clinic. Followed at the [facility]. Fluid status appears stable today.   Follow up with Dr Caryl Comes to discuss afib options.    [facility] [address] [place], [place] [address] [phone] 02/12/2022 1:33 PM

## 2022-02-13 ENCOUNTER — Telehealth (HOSPITAL_COMMUNITY): Payer: Self-pay

## 2022-02-13 DIAGNOSIS — I5022 Chronic systolic (congestive) heart failure: Secondary | ICD-10-CM

## 2022-02-13 NOTE — Telephone Encounter (Signed)
Patients daughter advised and verbalized understanding,lab appointment scheduled,lab orders entered patient will be out of town next week, will come in for blood work the following week.   Orders Placed This Encounter  Procedures   Basic metabolic panel    Standing Status:   Future    Standing Expiration Date:   02/14/2023    Order Specific Question:   Release to patient    Answer:   Immediate

## 2022-02-13 NOTE — Telephone Encounter (Signed)
-----   Message from Rafael Bihari, [place] sent at 02/12/2022  3:01 PM EDT ----- Kidney function improving.  Repeat BMET in 7-10 days.

## 2022-02-16 LAB — POCT INR: INR: 2.2 (ref 2.0–3.0)

## 2022-02-17 ENCOUNTER — Ambulatory Visit (INDEPENDENT_AMBULATORY_CARE_PROVIDER_SITE_OTHER): Payer: No Typology Code available for payment source | Admitting: *Deleted

## 2022-02-17 ENCOUNTER — Telehealth: Payer: Self-pay

## 2022-02-17 DIAGNOSIS — Z5181 Encounter for therapeutic drug level monitoring: Secondary | ICD-10-CM | POA: Diagnosis not present

## 2022-02-17 NOTE — Telephone Encounter (Signed)
I spoke to [place] from [place] and schedule a Home INR check 10/30.  She verbalized understanding.

## 2022-02-19 ENCOUNTER — Telehealth: Payer: Self-pay | Admitting: Internal Medicine

## 2022-02-19 NOTE — Telephone Encounter (Signed)
Requesting pt's office visit notes from visit on 01/08/22. Please advise

## 2022-02-24 ENCOUNTER — Telehealth: Payer: Self-pay

## 2022-02-24 ENCOUNTER — Other Ambulatory Visit: Payer: Self-pay

## 2022-02-24 ENCOUNTER — Telehealth: Payer: Self-pay | Admitting: Cardiology

## 2022-02-24 ENCOUNTER — Emergency Department (HOSPITAL_COMMUNITY)
Admission: EM | Admit: 2022-02-24 | Discharge: 2022-02-24 | Disposition: A | Discharge status: Home / Self Care | Payer: No Typology Code available for payment source | Attending: Emergency Medicine | Admitting: Emergency Medicine

## 2022-02-24 ENCOUNTER — Emergency Department (HOSPITAL_COMMUNITY): Payer: No Typology Code available for payment source

## 2022-02-24 ENCOUNTER — Telehealth (HOSPITAL_COMMUNITY): Payer: Self-pay

## 2022-02-24 ENCOUNTER — Encounter (HOSPITAL_COMMUNITY): Payer: Self-pay | Admitting: Emergency Medicine

## 2022-02-24 DIAGNOSIS — N189 Chronic kidney disease, unspecified: Secondary | ICD-10-CM | POA: Diagnosis not present

## 2022-02-24 DIAGNOSIS — E1122 Type 2 diabetes mellitus with diabetic chronic kidney disease: Secondary | ICD-10-CM | POA: Diagnosis not present

## 2022-02-24 DIAGNOSIS — J449 Chronic obstructive pulmonary disease, unspecified: Secondary | ICD-10-CM | POA: Diagnosis not present

## 2022-02-24 DIAGNOSIS — I509 Heart failure, unspecified: Secondary | ICD-10-CM | POA: Insufficient documentation

## 2022-02-24 DIAGNOSIS — R000 Tachycardia, unspecified: Secondary | ICD-10-CM | POA: Insufficient documentation

## 2022-02-24 DIAGNOSIS — I130 Hypertensive heart and chronic kidney disease with heart failure and stage 1 through stage 4 chronic kidney disease, or unspecified chronic kidney disease: Secondary | ICD-10-CM | POA: Diagnosis not present

## 2022-02-24 DIAGNOSIS — Z7951 Long term (current) use of inhaled steroids: Secondary | ICD-10-CM | POA: Diagnosis not present

## 2022-02-24 DIAGNOSIS — I4892 Unspecified atrial flutter: Secondary | ICD-10-CM | POA: Diagnosis not present

## 2022-02-24 DIAGNOSIS — R0602 Shortness of breath: Secondary | ICD-10-CM | POA: Diagnosis not present

## 2022-02-24 DIAGNOSIS — I4891 Unspecified atrial fibrillation: Secondary | ICD-10-CM

## 2022-02-24 LAB — BASIC METABOLIC PANEL
Anion gap: 8 (ref 5–15)
BUN: 33 mg/dL — ABNORMAL HIGH (ref 8–23)
CO2: 27 mmol/L (ref 22–32)
Calcium: 9.1 mg/dL (ref 8.9–10.3)
Chloride: 104 mmol/L (ref 98–111)
Creatinine, Ser: 1.77 mg/dL — ABNORMAL HIGH (ref 0.61–1.24)
GFR, Estimated: 39 mL/min — ABNORMAL LOW (ref >60)
Glucose, Bld: 153 mg/dL — ABNORMAL HIGH (ref 70–99)
Potassium: 3.9 mmol/L (ref 3.5–5.1)
Sodium: 139 mmol/L (ref 135–145)

## 2022-02-24 LAB — CBC WITH DIFFERENTIAL/PLATELET
Abs Immature Granulocytes: 0.03 10*3/uL (ref 0.00–0.07)
Basophils Absolute: 0 10*3/uL (ref 0.0–0.1)
Basophils Relative: 1 %
Eosinophils Absolute: 0.1 10*3/uL (ref 0.0–0.5)
Eosinophils Relative: 3 %
HCT: 42.2 % (ref 39.0–52.0)
Hemoglobin: 13.8 g/dL (ref 13.0–17.0)
Immature Granulocytes: 1 %
Lymphocytes Relative: 26 %
Lymphs Abs: 1.4 10*3/uL (ref 0.7–4.0)
MCH: 31.2 pg (ref 26.0–34.0)
MCHC: 32.7 g/dL (ref 30.0–36.0)
MCV: 95.5 fL (ref 80.0–100.0)
Monocytes Absolute: 0.5 10*3/uL (ref 0.1–1.0)
Monocytes Relative: 10 %
Neutro Abs: 3.1 10*3/uL (ref 1.7–7.7)
Neutrophils Relative %: 59 %
Platelets: 154 10*3/uL (ref 150–400)
RBC: 4.42 MIL/uL (ref 4.22–5.81)
RDW: 14.7 % (ref 11.5–15.5)
WBC: 5.2 10*3/uL (ref 4.0–10.5)
nRBC: 0 % (ref 0.0–0.2)

## 2022-02-24 LAB — PROTIME-INR
INR: 2 — ABNORMAL HIGH (ref 0.8–1.2)
Prothrombin Time: 22.3 seconds — ABNORMAL HIGH (ref 11.4–15.2)

## 2022-02-24 LAB — TROPONIN I (HIGH SENSITIVITY)
Troponin I (High Sensitivity): 18 ng/L — ABNORMAL HIGH (ref <18)
Troponin I (High Sensitivity): 19 ng/L — ABNORMAL HIGH (ref <18)

## 2022-02-24 LAB — MAGNESIUM: Magnesium: 2.2 mg/dL (ref 1.7–2.4)

## 2022-02-24 LAB — BRAIN NATRIURETIC PEPTIDE: B Natriuretic Peptide: 829.3 pg/mL — ABNORMAL HIGH (ref 0.0–100.0)

## 2022-02-24 MED ORDER — AMIODARONE HCL 200 MG PO TABS: 200.0000 mg | ORAL_TABLET | Freq: Every day | ORAL | 0 refills | Status: DC

## 2022-02-24 MED ORDER — SODIUM CHLORIDE 0.9 % IV BOLUS
500.0000 mL | Freq: Once | INTRAVENOUS | Status: AC
  Administered 2022-02-24: 500 mL via INTRAVENOUS

## 2022-02-24 NOTE — Telephone Encounter (Signed)
Routed to [facility] anticoag team - advised for [facility] to fax orders to former medical records room fax

## 2022-02-24 NOTE — ED Notes (Signed)
Cardiology at bedside, [facility] pacemaker interrogation in process

## 2022-02-24 NOTE — Telephone Encounter (Signed)
I received a call from [facility] from [facility] who mentioned that she was going to get INR on patient today, but he is presently in the ED.  I told her that he is scheduled for INR check on 10/30 so we will need one checked then.  She verbalized understanding.

## 2022-02-24 NOTE — Consult Note (Addendum)
Cardiology Consultation   Patient ID: Ricky Ricky Hayden MRN: [id number]; DOB: Dec 22, 1945  Admit date: 02/24/2022 Date of Consult: 02/24/2022  PCP:  Clinic, [facility] Cardiologist:  Glenetta Hew, MD  Electrophysiologist:  Virl Axe, MD  {   Patient Profile:   Ricky Ricky Hayden is a 76 y.o. 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  Device information Abbott CRT-P implanted 01/17/2020 LV lead is in LB area done on 01/28/22 and his RV apex lead is programmed subthreshold   AAD hx Amiodarone started 04/2021, afib    History of Present Illness:   Mr. Ricky Ricky Hayden was last seen by Dr. Caryl Comes 01/08/22, (he follows as well at the [facility], his cardiac care though perhaps mostly here), he was doing well, last echo reported 40-45%, no changes were made.  He was hospitalized 01/31/22 with AFib RVR, HF felt to be a contributor, he had already been planned for DCCV out patient. He underwent DCCV 02/03/22, and became hypotensive requiring brief phenylepherine HF team was brought on board, amiodarone gtt used during this admission, diuresed, did well. TEE this admission with marked reduction in LVEF Transitioned to PO meds and discharged 02/05/22 on amiodarone 200mg  BID  Saw the HF team 02/12/22, doing pretty well, walking further in flat ground again, , good medication compliance, stable home weights NO BB planned yet Entresto was restarted  He saw the AFib sam day amiodarone reduced to 200mg  daily, planned to have him see Dr. Caryl Comes for possible ablation strategies.  He did well since then, his daughters keep good track of him, very god compliance with medicines.  They check his vitals every  day AM and PM. Yesterday AM his BP and HR were found to be OK, no concerning readings at least. Last PM his BP was OK, HR was fast perhaps 120's but without symptoms This AM HR again fast though now feeling weak, fatigued, his BP reported to be low as well and brought in  He reports with his Aflutter he is never aware of any palpitations, never has any CP/pressure, or cardiac awareness.  But makes him feel weak and tired. He has not note any change in his breathing or unusual SOB, with his baseline COPD.  He has not had near syncope or syncope.  Labs are pending  He arrived in a wide complex tachycardia, and hypotensive with BP reported 70's, and EP was asked to see.  The patient is currently in AFlutter CVR 80's-90's, and his last couple BPs are 105-110/60's He feels well and hopes to go home His niece is bedside  Device interrogation Battery and lead measurements are stable (RV lead is programmed subthreshold) Current AFlutter episode in progress just short of 7 hours No HVR episodes  Past Medical History:  Diagnosis Date   Arthritis    osteoarthritis of left knee   Ascending aorta dilatation (HCC)    Balanitis    recurrent   Cardiac arrhythmia    life threatening, secondary to CCB vs b- blockers   Cardiomyopathy (HCC)    Chronic joint pain    Chronic systolic CHF (congestive heart failure) (HCC)    CKD (chronic kidney disease), stage III (HCC)    COPD (chronic obstructive  pulmonary disease) ([facility])    Coronary artery disease    Dilated aortic root (HCC)    Dyspnea    Enlarged prostate    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 Sherwood Ricky Hayden   Nocturia    Obesity    Pansinusitis    a.  complicated by brain abscess and bleeding requiring craniotomy in 1995.    PONV (postoperative nausea and vomiting)    Presence of permanent cardiac pacemaker    Sinusitis    s/p ethmoidectomy and nasal septoplasty   Vertigo    intermitantly    Past Surgical History:  Procedure Laterality Date   BIV UPGRADE N/A 01/29/2020   Procedure: BIV UPGRADE;  Surgeon: Evans Lance, MD;  Location: [facility];  Service: Cardiovascular;  Laterality: N/A;   CARDIOVERSION N/A 09/26/2021   Procedure: CARDIOVERSION;  Surgeon: [name], MD;  Location: [facility];  Service: Cardiovascular;  Laterality: N/A;   CARDIOVERSION N/A 02/03/2022   Procedure: CARDIOVERSION;  Surgeon: Josue Hector, MD;  Location: [facility] ENDOSCOPY;  Service: Cardiovascular;  Laterality: N/A;   CIRCUMCISION N/A 11/20/2013   Procedure: CIRCUMCISION ADULT;  Surgeon: Claybon Jabs, MD;  Location: [facility] ORS;  Service: Urology;  Laterality: N/A;   CRANIOTOMY  1995   hematomy due to sinus infection    CYSTOSCOPY/URETEROSCOPY/HOLMIUM LASER/STENT PLACEMENT Left 12/23/2021   Procedure: LEFT URETEROSCOPY/HOLMIUM LASER LITHOTRIPSY/STENT PLACEMENT retrograde;  Surgeon: Vira Agar, MD;  Location: [facility] ORS;  Service: Urology;  Laterality: Left;  90 MINUTES NEEDED FOR CASE   PACEMAKER IMPLANT N/A 01/17/2020   Procedure: PACEMAKER IMPLANT;  Surgeon: Deboraha Sprang, MD;  Location: [facility];  Service: Cardiovascular;  Laterality: N/A;   RIGHT/LEFT HEART CATH AND CORONARY ANGIOGRAPHY N/A 09/20/2019   Procedure: RIGHT/LEFT HEART CATH AND CORONARY ANGIOGRAPHY;  Surgeon: Jolaine Artist, MD;  Location: [place] [facility] LAB;  Service: Cardiovascular;  Laterality: N/A;   shoulder surg rt   1995   SINUS SURGERY WITH INSTATRAK     ethmoidectomy and nasal septum repair   TEE WITHOUT CARDIOVERSION N/A 09/26/2021   Procedure: TRANSESOPHAGEAL ECHOCARDIOGRAM (TEE);  Surgeon: [name], MD;  Location: [facility];  Service: Cardiovascular;  Laterality: N/A;   TEE WITHOUT CARDIOVERSION N/A 02/03/2022    Procedure: TRANSESOPHAGEAL ECHOCARDIOGRAM (TEE);  Surgeon: Josue Hector, MD;  Location: [facility] ENDOSCOPY;  Service: Cardiovascular;  Laterality: N/A;     Home Medications:  Prior to Admission medications   Medication Sig Start Date End Date Taking? Authorizing Provider  acetaminophen (TYLENOL) 500 MG tablet Take 1,000 mg by mouth every 6 (six) hours as needed for moderate pain.   Yes [provider]  albuterol (PROVENTIL HFA;VENTOLIN HFA) 108 (90 BASE) MCG/ACT inhaler Inhale 2 puffs into the lungs every 6 (six) hours as needed for wheezing. 12/10/14  Yes Maryellen Pile, MD  amiodarone (PACERONE) 200 MG tablet Take 1 tablet (200 mg total) by mouth daily. 02/12/22  Yes Fenton, Clint R, PA  dapagliflozin propanediol (FARXIGA) 10 MG TABS tablet Take 1 tablet (10 mg total) by mouth daily. 02/06/22  Yes Lavina Hamman, MD  feeding supplement (ENSURE ENLIVE / ENSURE PLUS) LIQD Take 237 mLs by mouth 2 (two) times daily between meals.  02/05/22  Yes Lavina Hamman, MD  finasteride (PROSCAR) 5 MG tablet TAKE 1/2 TABLET BY MOUTH DAILY 11/28/21  Yes Jennye Boroughs, MD  fluticasone-salmeterol [facility] INHUB) 250-50 MCG/ACT AEPB Inhale 1 puff into the lungs in the morning and at bedtime.   Yes [provider]  Melatonin 10 MG CAPS Take 10 mg by mouth at bedtime as needed (sleep).   Yes [provider]  Multiple Vitamins-Minerals (PRESERVISION AREDS 2 PO) Take 1 tablet by mouth in the morning and at bedtime.   Yes [provider]  pantoprazole (PROTONIX) 40 MG tablet Take 1 tablet (40 mg total) by mouth daily. 10/10/21  Yes Love, Ivan Anchors, PA-C  PHENobarbital (LUMINAL) 64.8 MG tablet Take 1 tablet (64.8 mg total) by mouth 2 (two) times daily. 07/24/15  Yes Corky Sox, MD  polyethylene glycol (MIRALAX / GLYCOLAX) 17 g packet Take 17 g by mouth 2 (two) times daily. Patient taking differently: Take 17 g by mouth daily as needed for moderate constipation. 10/10/21  Yes Love, Ivan Anchors, PA-C  pravastatin (PRAVACHOL) 40 MG tablet TAKE 1 TABLET BY MOUTH EVERY DAY IN THE EVENING Patient taking differently: Take 40 mg by mouth every evening. 11/28/21  Yes Jennye Boroughs, MD  sacubitril-valsartan (ENTRESTO) 24-26 MG Take 1 tablet by mouth 2 (two) times daily. 02/12/22  Yes Milford, Maricela Bo, FNP  Tiotropium Bromide Monohydrate (SPIRIVA RESPIMAT) 2.5 MCG/ACT AERS Inhale 2 each into the lungs every evening.   Yes [provider]  torsemide (DEMADEX) 20 MG tablet Take 2 tablets (40 mg total) by mouth in the morning. 02/06/22  Yes Lavina Hamman, MD  Vitamin D3 (VITAMIN D) 25 MCG tablet Take 1 tablet (1,000 Units total) by mouth daily. 10/10/21  Yes Love, Ivan Anchors, PA-C  warfarin (COUMADIN) 3 MG tablet Take 1 tablet (3 mg total) by mouth daily at 4 PM. Patient taking differently: Take 3-4.5 mg by mouth See admin instructions. Take 1 tablet by mouth every evening, except on Saturday take one and one half tablets or as directed by Coumadin clinic 12/09/21  Yes Cleaver, Jossie Ng, NP    Inpatient Medications: Scheduled Meds:  Continuous Infusions:  PRN Meds:   Allergies:    Allergies  Allergen Reactions   Calcium Channel Blockers Other (See Comments)    Came to hospital in 1995-caused chest pain    Jardiance [Empagliflozin] Other (See Comments)    reacted with phenobarbital (caused UTI)    Social History:   Social History   Socioeconomic History   Marital status: Widowed    Spouse name: Not on file   Number of children: Not on file   Years of education: Not on file   Highest education level: Not on file  Occupational History   Not on file  Tobacco Use   Smoking status: Former    Types: Cigarettes    Quit date: 05/04/1986    Years since quitting: 35.8   Smokeless tobacco: Never   Tobacco comments:    Former smoker 02/12/22  Vaping Use   Vaping Use: Never used  Substance and Sexual Activity   Alcohol use: Not Currently   Drug use: No   Sexual activity:  Yes  Other Topics Concern   Not on file  Social History Narrative   Not on file   Social Determinants of Health   Financial Resource Strain: Not on file  Food Insecurity: No Food Insecurity (02/01/2022)   Hunger Vital Sign    Worried About  Running Out of Food in the Last Year: Never true    Cataract in the Last Year: Never true  Transportation Needs: No Transportation Needs (02/01/2022)   PRAPARE - Hydrologist (Medical): No    Lack of Transportation (Non-Medical): No  Physical Activity: Not on file  Stress: Not on file  Social Connections: Not on file  Intimate Partner Violence: Not At Risk (02/01/2022)   Humiliation, Afraid, Rape, and Kick questionnaire    Fear of Current or Ex-Partner: No    Emotionally Abused: No    Physically Abused: No    Sexually Abused: No    Family History:   Family History  Problem Relation Age of Onset   Heart failure Mother 53   Heart attack Father 23     ROS:  Please see the history of present illness.  All other ROS reviewed and negative.     Physical Exam/Data:   Vitals:   02/24/22 1415 02/24/22 1430 02/24/22 1445 02/24/22 1500  BP: (!) 83/55 105/65 93/65 (!) 97/56  Pulse: (!) 48 75    Resp: (!) 22 17 (!) 21 13  Temp:      TempSrc:      SpO2: 100% 97% 98% 98%   No intake or output data in the 24 hours ending 02/24/22 1507    02/12/2022    1:22 PM 02/12/2022    9:49 AM 02/05/2022    1:08 AM  Last 3 Weights  Weight (lbs) 219 lb 9.6 oz 218 lb 9.6 oz 223 lb 1.6 oz  Weight (kg) 99.61 kg 99.156 kg 101.197 kg     There is no height or weight on file to calculate BMI.  General:  Well nourished, well developed, in no acute distress HEENT: normal Neck: no JVD Vascular: No carotid bruits; Distal pulses 2+ bilaterally Cardiac:  irreg-irreg, soft SM, no gallops or rubs Lungs:  CA , hs in not wheezing, no rhonchi or rales  Abd: soft, nontender, no hepatomegaly  Ext: trace if any edema Musculoskeletal:   No deformities, BUE and BLE strength normal and equal Skin: warm and dry  Neuro:  CNs 2-12 intact, no focal abnormalities noted Psych:  Normal affect   EKG:  The EKG was personally reviewed and demonstrates:    AFlutter 137bpm, LBBB, PVC  Telemetry:  Telemetry was personally reviewed and demonstrates:   AFlutter 80's-90's  Relevant CV Studies:  10/3/123: TEE  1. CHF service contacted to see patient and transferred to [facility]  updated.   2. No LAA thrombus found [facility] x 1 200 J converted from rapid fib.flutter  rate 122 to NSR with BiV pacing rate 84 bpm.   3. Patient dropped his sats mid 80's and BP 88 mmHg with propofol  Improved post procedure and with 100 ug/min neo Study was performed  expidesiously.   4. Left ventricular ejection fraction, by estimation, is 20 to 25%. The  left ventricle has normal function. The left ventricular internal cavity  size was severely dilated. Left ventricular diastolic function could not  be evaluated.   5. Bi V device leads in RA/RV. Right ventricular systolic function is  severely reduced. The right ventricular size is severely enlarged.   6. Left atrial size was severely dilated. No left atrial/left atrial  appendage thrombus was detected.   7. Right atrial size was severely dilated.   8. The mitral valve is normal in structure. Mild mitral valve  regurgitation.   9.  Tricuspid valve regurgitation is moderate.  10. The aortic valve is tricuspid. Aortic valve regurgitation is not  visualized. No aortic stenosis is present.    04/03/21: limited echo Findings:          Pericardium:  No significant pericardial effusion          LV function:  Severely depressed (< 30% EF)          RV:  Normal          IVC collapsibility:  Indeterminate      May 2021: R/LHC Prox LAD to Mid LAD lesion is 25% stenosed. Prox Cx to Dist Cx lesion is 25% stenosed.  Findings:  Ao = 98/69 (81) LV = 101/24 RA = 11 RV = 59/15 PA = 64/23 (40) PCW = 19 Fick  cardiac output/index = 5.6/2.6 PVR = 3.5 WU  FA sat = 94% PA sat = 64%, 70%  Assessment:  1. Minimal CAD 2. Nonischemic CM 3. Mild to moderately elevated filling pressures with normal cardiac output     2D Echo 4/21 1. Left ventricular ejection fraction, by estimation, is <20%. The left ventricle has severely decreased function. The left ventricle demonstrates global hypokinesis. The left ventricular internal cavity size was mildly dilated. There is mild concentric left ventricular hypertrophy. Left ventricular diastolic function could not be evaluated. Elevated left ventricular enddiastolic pressure. 2. Right ventricular systolic function is severely reduced. The right ventricular size is severely enlarged. There is moderately elevated pulmonary artery systolic pressure. 3. The mitral valve is normal in structure. Mild mitral valve regurgitation. No evidence of mitral stenosis. 4. The aortic valve is tricuspid. Aortic valve regurgitation is trivial. Mild to moderate aortic valve sclerosis/calcification is present, without any evidence of aortic stenosis. 5. Aortic dilatation noted. There is mild dilatation of the aortic root and of the ascending aorta measuring 41 mm and 2mm respectively. 6. The inferior vena cava is dilated in size with <50% respiratory variability, suggesting right atrial pressure of 15 mmHg. 7. Left atrial size was severely dilated. 8. Right atrial size was mildly dilated.    Laboratory Data:  High Sensitivity Troponin:   Recent Labs  Lab 01/31/22 1949 01/31/22 2337 02/24/22 1253  TROPONINIHS 23* 21* 18*     Chemistry Recent Labs  Lab 02/24/22 1253  NA 139  K 3.9  CL 104  CO2 27  GLUCOSE 153*  BUN 33*  CREATININE 1.77*  CALCIUM 9.1  MG 2.2  GFRNONAA 39*  ANIONGAP 8    No results for input(s): "PROT", "ALBUMIN", "AST", "ALT", "ALKPHOS", "BILITOT" in the last 168 hours. Lipids No results for input(s): "CHOL", "TRIG", "HDL", "LABVLDL",  "LDLCALC", "CHOLHDL" in the last 168 hours.  Hematology Recent Labs  Lab 02/24/22 1253  WBC 5.2  RBC 4.42  HGB 13.8  HCT 42.2  MCV 95.5  MCH 31.2  MCHC 32.7  RDW 14.7  PLT 154   Thyroid No results for input(s): "TSH", "FREET4" in the last 168 hours.  BNPNo results for input(s): "BNP", "PROBNP" in the last 168 hours.  DDimer No results for input(s): "DDIMER" in the last 168 hours.   Radiology/Studies:  DG Chest 1 View  Result Date: 02/24/2022 CLINICAL DATA:  Shortness of breath EXAM: CHEST  1 VIEW COMPARISON:  Radiograph 02/03/2022 FINDINGS: Unchanged mildly enlarged cardiac silhouette. Unchanged pacemaker leads. There is no focal airspace consolidation. There is no pleural effusion. No evidence of pneumothorax. There is no acute osseous abnormality. Thoracic spondylosis. Bilateral shoulder degenerative changes. IMPRESSION: Unchanged  mild cardiomegaly. No focal airspace disease or overt pulmonary edema. Electronically Signed   By: Maurine Simmering M.D.   On: 02/24/2022 13:46     Assessment and Plan:   AFlutter w/RVR Persistent Afib CHA2DS2Vasc is 4, on warfarin Poorly tolerated with hypotension on arrival Current episode vis device is 6 hours in duration Dr. Curt Bears was able to pacer terminate his Flutter to SR w/CL of 125ms  SR/VP rhythm noted with PVCs 100/61 Labs were still pending   3. Chronic CHF Bive failure No unusual SOB Exam does not suggest volume OL CorVue is at his threshold, trending down   Once labs are back, if no other issues arise that would require hospitalization, he can be discharged from EP perspective  Back on amiodarone 200mg  BID EP follow up is in place for next week with myself  D/w ER MD       Risk Assessment/Risk Scores:    For questions or updates, please contact [place] Please consult [email or web address] for contact info under    Signed, Baldwin Jamaica, PA-C  02/24/2022 3:07 PM  I have seen and examined this patient  with Tommye Standard.  Agree with above, note added to reflect my findings.  Patient presented to the hospital after he was noted to have rapid heart rates.  He was found to be in atrial flutter.  His blood pressure was low.  Once in the emergency room, his heart rates came down and his blood pressure improved.  GEN: Well nourished, well developed, in no acute distress  HEENT: normal  Neck: no JVD, carotid bruits, or masses Cardiac: Irregular, tachycardic; no murmurs, rubs, or gallops,no edema  Respiratory:  clear to auscultation bilaterally, normal work of breathing GI: soft, nontender, nondistended, + BS MS: no deformity or atrophy  Skin: warm and dry Neuro:  Strength and sensation are intact Psych: euthymic mood, full affect   Atrial flutter: Had rapid ventricular response.  Feeling poorly in atrial flutter.  Through his pacemaker, atrial overdrive pacing converted him to sinus rhythm.  At this point, okay to return home.  Would increase amiodarone to 200 mg twice daily.  We [facility] arrange for follow-up in cardiology clinic.  Ricky [facility]den M. Deshanna Kama MD 02/24/2022 5:57 PM

## 2022-02-24 NOTE — ED Provider Notes (Signed)
[facility] DEPARTMENT Provider Note   CSN: [id number] Arrival date & time: 02/24/22  1204     History Chief Complaint  Patient presents with   Tachycardia    Ricky Hayden is a 76 y.o. male cardiac arrhythmia, cardiomyopathy, CKD, CHF, COPD, GERD, hyperlipidemia, hypertension, type 2 diabetes, paroxysmal A-fib, presents to the emerged department for evaluation of high heart rate and low blood pressure.  Patient reports he was not having any symptoms.  He reports that his shortness of breath is at his baseline.  Denies any chest pain, palpitations, fevers, recent illnesses.  Denies any nausea or vomiting.  He reports that his children have been checking his blood pressure and heart rate given his history of he is needing cardioversion.  Reports that in fatigue for the past few weeks but nothing new.  He was recently switched from his amiodarone 200 mg twice a day to just once a day.  They now started him on Entresto as well.  He presents with his niece.  He reports that last night he had a higher heart rate around 1 37-1 40 and had a blood pressure that was decreased in the 31D systolically.  Reports that his heart rate was higher this morning and his blood pressure was low blood, but he does not remove these numbers.  He reports compliance with all of his medications.  HPI     Home Medications Prior to Admission medications   Medication Sig Start Date End Date Taking? Authorizing Provider  acetaminophen (TYLENOL) 500 MG tablet Take 1,000 mg by mouth every 6 (six) hours as needed for moderate pain.    [provider]  albuterol (PROVENTIL HFA;VENTOLIN HFA) 108 (90 BASE) MCG/ACT inhaler Inhale 2 puffs into the lungs every 6 (six) hours as needed for wheezing. 12/10/14   Maryellen Pile, MD  amiodarone (PACERONE) 200 MG tablet Take 1 tablet (200 mg total) by mouth daily. 02/12/22   Fenton, Clint R, PA  dapagliflozin propanediol (FARXIGA) 10 MG TABS tablet  Take 1 tablet (10 mg total) by mouth daily. 02/06/22   Lavina Hamman, MD  feeding supplement (ENSURE ENLIVE / ENSURE PLUS) LIQD Take 237 mLs by mouth 2 (two) times daily between meals. 02/05/22   Lavina Hamman, MD  finasteride (PROSCAR) 5 MG tablet TAKE 1/2 TABLET BY MOUTH DAILY 11/28/21   Jennye Boroughs, MD  fluticasone-salmeterol [facility] INHUB) 250-50 MCG/ACT AEPB Inhale 1 puff into the lungs in the morning and at bedtime.    [provider]  Melatonin 10 MG CAPS Take 10 mg by mouth at bedtime as needed (sleep).    [provider]  Multiple Vitamins-Minerals (PRESERVISION AREDS 2 PO) Take 1 tablet by mouth in the morning and at bedtime.    [provider]  pantoprazole (PROTONIX) 40 MG tablet Take 1 tablet (40 mg total) by mouth daily. 10/10/21   Love, Ivan Anchors, PA-C  PHENobarbital (LUMINAL) 64.8 MG tablet Take 1 tablet (64.8 mg total) by mouth 2 (two) times daily. 07/24/15   Corky Sox, MD  polyethylene glycol (MIRALAX / GLYCOLAX) 17 g packet Take 17 g by mouth 2 (two) times daily. Patient taking differently: Take 17 g by mouth daily as needed for moderate constipation. 10/10/21   Love, Ivan Anchors, PA-C  pravastatin (PRAVACHOL) 40 MG tablet TAKE 1 TABLET BY MOUTH EVERY DAY IN THE EVENING 11/28/21   Jennye Boroughs, MD  sacubitril-valsartan (ENTRESTO) 24-26 MG Take 1 tablet by mouth 2 (two) times  daily. 02/12/22   Milford, Maricela Bo, FNP  Tiotropium Bromide Monohydrate (SPIRIVA RESPIMAT) 2.5 MCG/ACT AERS Inhale 2 each into the lungs every evening.    [provider]  torsemide (DEMADEX) 20 MG tablet Take 2 tablets (40 mg total) by mouth in the morning. 02/06/22   Lavina Hamman, MD  Vitamin D3 (VITAMIN D) 25 MCG tablet Take 1 tablet (1,000 Units total) by mouth daily. 10/10/21   Love, Ivan Anchors, PA-C  warfarin (COUMADIN) 3 MG tablet Take 1 tablet (3 mg total) by mouth daily at 4 PM. Patient taking differently: Take 3-4.5 mg by mouth See admin instructions. Take 1  tablet by mouth every evening, except on Saturday take one and one half tablets or as directed by Coumadin clinic 12/09/21   Deberah Pelton, NP      Allergies    Calcium channel blockers and Jardiance [empagliflozin]    Review of Systems   Review of Systems  Constitutional:  Negative for chills and fever.  Respiratory:  Positive for shortness of breath.   Cardiovascular:  Negative for chest pain and palpitations.  See HPI  Physical Exam Updated Vital Signs BP 105/65   Pulse 75   Temp (!) 97.4 F (36.3 C) (Oral)   Resp 17   SpO2 97%  Physical Exam Constitutional:      General: He is not in acute distress.    Appearance: He is not ill-appearing, toxic-appearing or diaphoretic.  HENT:     Mouth/Throat:     Mouth: Mucous membranes are moist.  Cardiovascular:     Rate and Rhythm: Tachycardia present. Rhythm irregular.  Pulmonary:     Effort: Pulmonary effort is normal. No respiratory distress.     Comments: Expiratory coarse rhonchi heard on bilateral lungs.  Patient speaking full sentences with ease, satting well room air without increased work of breathing. Musculoskeletal:     Right lower leg: No edema.     Left lower leg: No edema.  Skin:    General: Skin is warm and dry.  Neurological:     General: No focal deficit present.     Mental Status: He is alert. Mental status is at baseline.     ED Results / Procedures / Treatments   Labs (all labs ordered are listed, but only abnormal results are displayed) Labs Reviewed  BASIC METABOLIC PANEL - Abnormal; Notable for the following components:      Result Value   Glucose, Bld 153 (*)    BUN 33 (*)    Creatinine, Ser 1.77 (*)    GFR, Estimated 39 (*)    All other components within normal limits  TROPONIN I (HIGH SENSITIVITY) - Abnormal; Notable for the following components:   Troponin I (High Sensitivity) 18 (*)    All other components within normal limits  CBC WITH DIFFERENTIAL/PLATELET  MAGNESIUM  PROTIME-INR   BRAIN NATRIURETIC PEPTIDE  TROPONIN I (HIGH SENSITIVITY)    EKG EKG Interpretation  Date/Time:  Tuesday February 24 2022 12:20:36 EDT Ventricular Rate:  137 PR Interval:    QRS Duration: 160 QT Interval:  414 QTC Calculation: 625 R Axis:   259 Text Interpretation: Wide QRS tachycardia with frequent Premature ventricular complexes Right superior axis deviation Non-specific intra-ventricular conduction block Minimal voltage criteria for LVH, may be normal variant ( Cornell product ) Abnormal ECG LBBB present, similar to old EKGs When compared with ECG of 12-Feb-2022 13:27, PREVIOUS ECG IS PRESENT Confirmed by Regan Lemming ([id number]) on 02/24/2022 12:26:20 PM  Radiology DG Chest 1 View  Result Date: 02/24/2022 CLINICAL DATA:  Shortness of breath EXAM: CHEST  1 VIEW COMPARISON:  Radiograph 02/03/2022 FINDINGS: Unchanged mildly enlarged cardiac silhouette. Unchanged pacemaker leads. There is no focal airspace consolidation. There is no pleural effusion. No evidence of pneumothorax. There is no acute osseous abnormality. Thoracic spondylosis. Bilateral shoulder degenerative changes. IMPRESSION: Unchanged mild cardiomegaly. No focal airspace disease or overt pulmonary edema. Electronically Signed   By: Maurine Simmering M.D.   On: 02/24/2022 13:46    Procedures Procedures   Medications Ordered in ED Medications - No data to display  ED Course/ Medical Decision Making/ A&P Clinical Course as of 02/24/22 1702  Tue Feb 24, 2022  1502 I evaluated the patient at bedside.  He is 76 year old male presenting with a chief complaint of tachycardia.  Cardiology who is coming and evaluated at bedside.  They recommended changes to his pacemaker management which were completed and patient has had now symptomatic improvement with better heart rate control from 120-75.  Blood pressure has stabilized as well. Had a long conversation with patient and family.  They would like to be discharged as they were told by  cardiology that they would be being discharged soon.  Still waiting some lab results at this time.  Repeat troponin, INR, BNP all pending at this time.  Burtis Junes the patient will be stable for outpatient care management per cardiology recommendations. [CC]    Clinical Course User Index [CC] Tretha Sciara, MD                           Medical Decision Making Amount and/or Complexity of Data Reviewed Labs: ordered.   76 y/o M presents to the ER for evaluation of high heart rate and hypotension.  Differential diagnosis includes was limited to unstable A-fib, unstable SVT, electrolyte normality, dehydration, CHF, arrhythmia.  Vital signs show low blood pressure systolically in the 32T.  Heart rate in the 120s, satting well room air with any increased work of breathing.  Afebrile.  Patient is alert and oriented.  He is well-appearing, not diaphoretic.  In no acute distress.  Given his history of admissions and needing cardioversion or amiodarone drips because of his A-fib with RVR, did consult cardiology.  I spoke with Dr. Radford Pax who team will come and see the patient.  Labs and imaging ordered in the meantime.   I independently reviewed and interpreted the patient's labs.BMP shows elevated glucose at 153, BUN at 33, creatinine at 1.77.  Troponin initially elevated 18 although this appears around patient's baseline.  CBC, mag, INR, BNP, etc. troponin still pending.  EKG reviewed and interpreted by an attending.  Shows wide QRS tachycardia with frequent PVCs.  Right superior axis deviation.  Nonspecific interventricular conduction block.  Minimal voltage criteria for LVH.  Chest x-ray shows unchanged mild cardiomegaly.  No focal airspace disease or overt pulmonary edema.  Patient's expiratory rhonchi are likely from a COPD.  Patient was assessed by Dr. Curt Bears at bedside who worked with his pacemaker to place him back in a normal rhythm. Recommends going back to amiodarone 200mg  BID instead of once  a day. Recommended discharge home as long as his lab work was reassuring.   Will hand off patient to oncoming team for lab work follow up and further evaluation of his blood pressure.   5:05 PM Care of Ricky Hayden  transferred to [place]  at the end of my  shift as the patient will require reassessment once labs/imaging have resulted. Patient presentation, ED course, and plan of care discussed with review of all pertinent labs and imaging. Please see his/her note for further details regarding further ED course and disposition. Plan at time of handoff is follow up on labs, reassess blood pressure. This may be altered or completely changed at the discretion of the oncoming team pending results of further workup.   Final Clinical Impression(s) / ED Diagnoses Final diagnoses:  None    Rx / DC Orders ED Discharge Orders     None         Sherrell Puller, PA-C 02/24/22 1717    Tretha Sciara, MD 02/25/22 [phone]

## 2022-02-24 NOTE — Telephone Encounter (Signed)
Patient wife reached out concerns of low BP and high HR. BP 80/60 and HR 136. Reached back out to patient wife informed her that Audry Pili encourage them to go to the nearest ED for evaluation.

## 2022-02-24 NOTE — Telephone Encounter (Signed)
Caitlynn from [facility] is calling in regards to orders sent for patient. Requesting call back.

## 2022-02-24 NOTE — ED Provider Triage Note (Signed)
Emergency Medicine Provider Triage Evaluation Note  Ricky Hayden , a 76 y.o. male  was evaluated in triage.  Pt complains of fatigue and shortness of breath that started last night.  Patient states he checked his heart rate which was elevated.  Denies chest pain.  Admits to shortness of breath.  Numerous admissions for A-fib with RVR.  He has been compliant with his warfarin.  Review of Systems  Positive: Fatigue, SOB Negative: CP  Physical Exam  BP 114/64 (BP Location: Right Arm)   Pulse (!) 112   Temp (!) 97.4 F (36.3 C) (Oral)   Resp 18   SpO2 98%  Gen:   Awake, no distress   Resp:  Normal effort  MSK:   Moves extremities without difficulty  Other:    Medical Decision Making  Medically screening exam initiated at 12:50 PM.  Appropriate orders placed.  Ricky Hayden was informed that the remainder of the evaluation will be completed by another provider, this initial triage assessment does not replace that evaluation, and the importance of remaining in the ED until their evaluation is complete.  Routine labs, cardiac labs Rechecked HR in triage which was 140s. Informed, Ricky Hayden, charge RN need for room.    Ricky Hayden, [place] 02/24/22 1251

## 2022-02-24 NOTE — ED Triage Notes (Signed)
Pt states he was here a few weeks ago for SVT, had a cardioversion. Pt was checking his heart rate this morning and noticed he had rates 140's. Pt has some SOB, denies CP.

## 2022-02-24 NOTE — ED Provider Notes (Signed)
I received this patient at shift change from [facility], [place].  Please see her note for original history and work-up.  In short patient is a 76 year old male who presented today with heart rate in the 140s as well as hypotension that started yesterday.  He denies any chest pain but did say that he was somewhat fatigued with mild shortness of breath.  When he arrived he had a heart rate in the 120s to 130s.  There was a question of A-fib RVR versus SVT versus sinus tach.  Cardiology saw the patient and cleared him from their standpoint and started him on 200 mg of amiodarone twice daily.  Previously only on 200 mg daily.  At this time INR, BNP and second troponin are trending.  Plan is for me to follow-up on these and make sure that patient's blood pressure is within normal limits prior to discharge.   Physical Exam  BP (!) 97/56   Pulse 75   Temp (!) 97.4 F (36.3 C) (Oral)   Resp 13   SpO2 98%   Physical Exam Vitals and nursing note reviewed.  Constitutional:      Appearance: Normal appearance.  HENT:     Head: Normocephalic and atraumatic.  Eyes:     General: No scleral icterus.    Conjunctiva/sclera: Conjunctivae normal.  Pulmonary:     Effort: Pulmonary effort is normal. No respiratory distress.  Musculoskeletal:     Right lower leg: No edema.     Left lower leg: No edema.  Skin:    Findings: No rash.  Neurological:     Mental Status: He is alert.  Psychiatric:        Mood and Affect: Mood normal.     Procedures  Procedures  ED Course / MDM    Medical Decision Making Amount and/or Complexity of Data Reviewed Labs: ordered.  Risk Prescription drug management.    Troponin stable 18 and 19. BNP 829.3, up from 382.2 12 days ago INR 2.0   I spoke about patient's results with him and his family member at bedside and I suggested admission.  They do not want to be admitted to the hospital and would prefer to go home.  We discussed risks of this.  His troponin is  stable.  He has already been seen by cardiology in the department today and they have a follow-up set up for next week.  He will take twice daily amiodarone per their suggestion and he will only take his diuretic if his blood pressure can support it.  We discussed parameters for this.  He request discharge at this time and family member will take him home    Rhae Hammock, PA-C 02/24/22 1746    Elgie Congo, MD 02/24/22 2259

## 2022-02-24 NOTE — Discharge Instructions (Addendum)
You presented today with an elevated heart rate and low blood pressure.  You were seen by cardiology and they made the following recommendations Change your daily amiodarone to twice daily.  I have refilled this medication in the event that this causes you to run out sooner Follow-up with Renee-Lynn Ursley next week as planned  Additionally, we would like you to only take your torsemide 40mg  that is scheduled in the morning if your blood pressure is above 120/90.  Otherwise this may cause your blood pressure to drop too low.  It was a pleasure to meet you and we hope you feel better!

## 2022-02-25 NOTE — Telephone Encounter (Signed)
Called and spoke with Santiago Glad at [facility]. Made her aware that faxes were received, reviewed by anticoagulation clinic and waiting for MD to sign. Once they are signed we will fax them back. Santiago Glad stated she would make Caitlynn aware.

## 2022-02-27 ENCOUNTER — Other Ambulatory Visit (HOSPITAL_COMMUNITY): Payer: Self-pay | Admitting: Cardiology

## 2022-02-27 ENCOUNTER — Ambulatory Visit (HOSPITAL_COMMUNITY)
Admission: RE | Admit: 2022-02-27 | Discharge: 2022-02-27 | Disposition: A | Discharge status: Home / Self Care | Payer: No Typology Code available for payment source | Source: Ambulatory Visit | Attending: Cardiology | Admitting: Cardiology

## 2022-02-27 DIAGNOSIS — I5022 Chronic systolic (congestive) heart failure: Secondary | ICD-10-CM | POA: Insufficient documentation

## 2022-02-27 LAB — BASIC METABOLIC PANEL
Anion gap: 9 (ref 5–15)
BUN: 21 mg/dL (ref 8–23)
CO2: 26 mmol/L (ref 22–32)
Calcium: 9.1 mg/dL (ref 8.9–10.3)
Chloride: 107 mmol/L (ref 98–111)
Creatinine, Ser: 1.47 mg/dL — ABNORMAL HIGH (ref 0.61–1.24)
GFR, Estimated: 49 mL/min — ABNORMAL LOW (ref >60)
Glucose, Bld: 83 mg/dL (ref 70–99)
Potassium: 5 mmol/L (ref 3.5–5.1)
Sodium: 142 mmol/L (ref 135–145)

## 2022-02-27 MED ORDER — DAPAGLIFLOZIN PROPANEDIOL 10 MG PO TABS: 10.0000 mg | ORAL_TABLET | Freq: Every day | ORAL | 3 refills | Status: DC

## 2022-03-02 ENCOUNTER — Ambulatory Visit (INDEPENDENT_AMBULATORY_CARE_PROVIDER_SITE_OTHER): Payer: No Typology Code available for payment source

## 2022-03-02 DIAGNOSIS — Z5181 Encounter for therapeutic drug level monitoring: Secondary | ICD-10-CM | POA: Diagnosis not present

## 2022-03-02 LAB — POCT INR: INR: 1.7 — AB (ref 2.0–3.0)

## 2022-03-02 NOTE — Patient Instructions (Signed)
Description   Spoke with Ricky Hayden at  [facility]. Advised patient to take 1.5 tablets today and then continue taking 1 tablet daily.  Recheck INR in 2 weeks.  *10/5 Started Amio 200mg  BID x 7 days, then 200mg  daily*  Anticoagulation Clinic [phone]

## 2022-03-05 ENCOUNTER — Encounter: Payer: Self-pay | Admitting: Physician Assistant

## 2022-03-05 ENCOUNTER — Ambulatory Visit: Payer: No Typology Code available for payment source | Attending: Physician Assistant | Admitting: Physician Assistant

## 2022-03-05 VITALS — BP 118/68 | HR 125 | Ht 67.0 in | Wt 221.8 lb

## 2022-03-05 DIAGNOSIS — Z95810 Presence of automatic (implantable) cardiac defibrillator: Secondary | ICD-10-CM

## 2022-03-05 DIAGNOSIS — I428 Other cardiomyopathies: Secondary | ICD-10-CM | POA: Diagnosis not present

## 2022-03-05 DIAGNOSIS — I5022 Chronic systolic (congestive) heart failure: Secondary | ICD-10-CM | POA: Diagnosis not present

## 2022-03-05 DIAGNOSIS — I4892 Unspecified atrial flutter: Secondary | ICD-10-CM

## 2022-03-05 DIAGNOSIS — I4819 Other persistent atrial fibrillation: Secondary | ICD-10-CM

## 2022-03-05 LAB — CUP PACEART INCLINIC DEVICE CHECK
Battery Remaining Longevity: 82 mo
Battery Voltage: 3.01 V
Brady Statistic RA Percent Paced: 14 %
Brady Statistic RV Percent Paced: 80 %
Date Time Interrogation Session: 20231102190628
Implantable Lead Connection Status: 753985
Implantable Lead Connection Status: 753985
Implantable Lead Connection Status: 753985
Implantable Lead Implant Date: 20210915
Implantable Lead Implant Date: 20210915
Implantable Lead Implant Date: 20210927
Implantable Lead Location: 753858
Implantable Lead Location: 753859
Implantable Lead Location: 753860
Implantable Lead Model: 3830
Implantable Pulse Generator Implant Date: 20210915
Lead Channel Impedance Value: 450 Ohm
Lead Channel Impedance Value: 475 Ohm
Lead Channel Impedance Value: 550 Ohm
Lead Channel Pacing Threshold Amplitude: 0.5 V
Lead Channel Pacing Threshold Amplitude: 0.75 V
Lead Channel Pacing Threshold Pulse Width: 0.4 ms
Lead Channel Pacing Threshold Pulse Width: 0.5 ms
Lead Channel Sensing Intrinsic Amplitude: 12 mV
Lead Channel Sensing Intrinsic Amplitude: 2.2 mV
Lead Channel Setting Pacing Amplitude: 0.25 V
Lead Channel Setting Pacing Amplitude: 1.5 V
Lead Channel Setting Pacing Amplitude: 2 V
Lead Channel Setting Pacing Pulse Width: 0.05 ms
Lead Channel Setting Pacing Pulse Width: 0.4 ms
Lead Channel Setting Sensing Sensitivity: 2 mV
Pulse Gen Model: 3222
Pulse Gen Serial Number: 9188373

## 2022-03-05 NOTE — Progress Notes (Addendum)
Cardiology Office Note Date:  03/05/2022  Patient ID:  Ricky Hayden, Ricky Hayden 1946/03/25, MRN [id number] PCP:  Cl[facility], [facility]  Cardiologist:  [place] ? AHF: Dr. Haroldine Laws EP: Dr. Caryl Comes    Chief Complaint:  post hospital  History of Present Illness: Ricky Hayden is a 76 y.o. 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 Caryl Comes, last seen by him Jan 2022, at that visit he was doing well, much improved with aggressive HF management via HF clinic, planned to follow at the [facility] and see [place] PRN.  04/06/21: Pt's daughter called, reporting him currently admitted with new Afib and strategies they had been using so far unsuccessful (meds, TEE/DCCV reportedly) asked if we could assist, advised we could not given he was not at [facility] facility.  Admitted to [facility] 04/03/21 Went with CP, palpitations Found in an SVT >> ultimately AFlutter with reports of a recent outpt diagnosis of Afib Volume OL/acute CHF AKI/CKD (III)  Hep gtt, amio started > TEE/DCCV unsuccessful 12/3 > DCCV successful  12/8 (after diuresis) Last albs K+ 4.5 BUN/Creat 135/2.20 (01/29/20 Creat 1.46, baseline then about 1.2) WBC 15.2 H/H 14/44 Plts 157  05/12/21, called with concerns of new medications running out.  Looks like he saw/spoke, [facility]21 BUN/Creat 26/1.65 K+ 4.0 WBC 5.4 H/H 13/41 Torsemide resumed Looks like he has an appt w[facility] on 06/02/21  I saw him 05/15/21 Unclear why his visit is with [place] today, he follows with EP/cadiology at the [facility] He is doing better though still since his admission He has COPD and a chronic on/off cough, on/off production, perhaps still alittle more currently then usual Breathing is back to his baseline No CP, no palpitations No near syncope  or syncope No shocks Device function intact, felt volume stable Pt preferred to follow at the [place] primarily  Admitted 08/29/2021 complaining of fever, chills, shortness of breath.  Patient initially required BiPAP for shortness of breath but was eventually weaned off.  Patient was found to have a fever and his urinalysis showed features concerning for UTI.  Patient was found to have urosepsis and was started on IV antibiotics.  CT abdomen showed a distended bladder with nonobstructive renal stones and an exophytic lesion on left kidney.  Patient's blood cultures returned positive for Proteus, and IV antibiotics were broadened to Cefazolin for 14 days.  On 5/2, patient had a decrease in level of consciousness and increasing oxygen requirement.  Patient required intubation and had a brief PEA arrest after intubation.  Chest x-ray showed a right infiltrate concerning for an aspiration event with H CAP. Patient developed hypotension, septic shock and was treated with fluid boluses and pressors.  Patient had a decrease in U OP and concerns of volume overload.  Patient's acute on chronic renal failure was felt to be due to acute tubular necrosis secondary to severe UTI/septic/shock.  Patient had an EEG that was negative for seizure.  CT head was negative for acute changes.  Patient was extubated on 5/12.  Eventually, patient's AKI resolved and his renal function returned to baseline.  Delirium has been continuing to resolve but patient continued to have a delay in processing, decreased awareness of deficits, fatigue, lysed generalized weakness, and poor balance.  Due to his functional decline, patient  was transferred to the physical medicine and rehabilitation service and has been working with PT/OT. on 5/24, a rapid response was called on this patient because patient had a low BP of 79/57.  On assessment, patient was alert, able to answer questions, denied chest pain, shortness of breath.  Heart rate was found to be  131 bpm.  Patient was given 1 L normal saline with improvement in BP. EKG showed a regular, wide complex tachycardia with HR 130 (known LBBB present)  Suspect 2:1 AFlutter, amiodarone increased, 09/26/21, DCCV was successful.  apixaban which was changed due to lovenox as phenobarbital decreases effectiveness of apixaban, edoxaban also has drug drug interaction with phenobarbital, decision is made to continue anticoagulation with couamdin  Discharged back to rehab , cardiology s/o 10/02/21  Dr. Ellyn Hack on 10/20/2021, he had a left lower extremity edema.  Venous Doppler was ordered to rule out DVT.  He was also felt to be mildly volume overloaded.  Spironolactone 12.5 mg daily was added to his medical regimen.  He was kept on 40 mg daily of torsemide with instruction of taking additional 20 mg as needed in the afternoon if weight increase by more than 3 pounds overnight or 5 pounds in a single week.  Patient was seen in the ED on 10/25/2021 for left lower extremity edema, venous Doppler was negative.  Patient was holding sinus rhythm at the time.   Readmitted 10/27/2021 due to abdominal pain and flank pain and was diagnosed with pyelonephritis. Back in Afib/flutter/RVR cardiology consulted noted PAFib/flutter on telemetry, coreg was increased. Recommended resuming GDMT when renal function allowed otherwise Discharged 11/03/21 Jardiance stopped with recurrent UTIs  Saw cardiology 12/09/21, doinng OK cardiac-wise, wanted to come off warfarin, was continued.  Was pending left ureteroscopy with laser lithotripsy and stent.  Noc hanges were made.  He saw Dr. Caryl Comes 01/08/22, was euvolemic, , device functioning normally, no changes were made, did discuss reaching out to pharmacy to look for alternative?  Saw AF clinic 01/26/22, daughter reported fast HRs at home, pt denied any awareness, symptoms.  On antibiotics for dental infection.  He was in [place] 122.  Planned for TEE/DCCV.  Admitted 01/31/22, acute/chronic CHF,  RVR, despite IV diuretic, sluggish urine OP.  TEE/DCC: On Rx coumadin NR 2.4 Anesthesia: Propofol Patient with rapid afib 122 bpm Sats 88% with propofol and required 100ug/min Neo to support BP No LAA thrombus Mild MR Moderate TR Severe RV dysfunction  Severe LV dysfunction 20% DCC x 1 200J converted from Afib to NSR with BiV pacing rate 84 bpm Sats improved as he awoke Neo weaned over 15 minutes.  Have asked CHF team to see and transfer to 2 H for more intense Rx    HF team assisted with management with BIVENTRICULAR failure. Discharged 02/05/22 (perhaps at patient insistence)  Saw HF and AF clinic 02/12/22, doing OK, DOE with ambulation, wearing O2, again, wanted off warfarin 2/2 INR burden.  Not ready for BB yet. Discussed phenobarbital issue and that he has been on it for many years w/hx of brain abscess/seizures.  Deferred to neurology Maintaining SR, amiodarone dose reduced to maintenance dosing 200mg  daily Discussed amio washout for Tikosyn AVN ode ablation vs AFib ablation  candidacy.  Deferred to Dr. Caryl Comes  ER visit 02/24/22, referred by office with reports of tachycardia, 120's-130's with low BPs at home,  He was seen by myself and Dr. Curt Bears when we were bdeside in CVR AFlutter on telemetry, seen by his device episode ongoing 7  hours and pace terminated in the ER and planned to discharge on amiodarone 200mg  BID if labs/no other reason to admit was found.  TODAY He is with his daughter today They continue to be great at medication compliance and vitals at home His BP and HRs have been good No recurrent fast rates or markedly low BPs. His BP does tend to swing a bit typically SBP is 20's Am and 90's evenings  No CP, palpitations No rest SOB Does have some mild DOE, though not changed from what he feels is baseline No near syncope or syncope.  He is NOT taking the [facility], this they report since his last discharge prior to the ER visit. Despite HF note to restart it at  their last visit. He reports being told as well at the hospital not to take his torsemide if his BP is < 120, though he will take it if his weight is up 2lbs or looks swollen, regardless of his BP   Device information Abbott CRT-P implanted 01/17/2020 Left bundle lead in LV port RV lead is programmed subthreshold  AAD hx Amiodarone started 04/2021, afib  Past Medical History:  Diagnosis Date   Arthritis    osteoarthritis of left knee   Ascending aorta dilatation (HCC)    Balanitis    recurrent   Cardiac arrhythmia    life threatening, secondary to CCB vs b- blockers   Cardiomyopathy (HCC)    Chronic joint pain    Chronic systolic CHF (congestive heart failure) (HCC)    CKD (chronic kidney disease), stage III (HCC)    COPD (chronic obstructive pulmonary disease) (HCC)    Coronary artery disease    Dilated aortic root (HCC)    Dyspnea    Enlarged prostate    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 Newell Coral   Nocturia    Obesity    Pansinusitis    a.  complicated by brain abscess and bleeding requiring craniotomy in 1995.   PONV (postoperative nausea and vomiting)    Presence of permanent cardiac pacemaker    Sinusitis    s/p ethmoidectomy and nasal septoplasty   Vertigo    intermitantly    Past Surgical History:  Procedure Laterality Date   BIV UPGRADE N/A 01/29/2020   Procedure: BIV UPGRADE;  Surgeon: Marinus Maw, MD;  Location: [facility] INVASIVE CV LAB;  Service: Cardiovascular;  Laterality: N/A;   CARDIOVERSION N/A 09/26/2021   Procedure: CARDIOVERSION;  Surgeon: Chilton Si, MD;  Location: [facility] ENDOSCOPY;  Service: Cardiovascular;  Laterality: N/A;   CARDIOVERSION N/A 02/03/2022   Procedure: CARDIOVERSION;  Surgeon: Wendall Stade, MD;   Location: [facility] ENDOSCOPY;  Service: Cardiovascular;  Laterality: N/A;   CIRCUMCISION N/A 11/20/2013   Procedure: CIRCUMCISION ADULT;  Surgeon: Garnett Farm, MD;  Location: [facility] ORS;  Service: Urology;  Laterality: N/A;   CRANIOTOMY  1995   hematomy due to sinus infection    CYSTOSCOPY/URETEROSCOPY/HOLMIUM LASER/STENT PLACEMENT Left 12/23/2021   Procedure: LEFT URETEROSCOPY/HOLMIUM LASER LITHOTRIPSY/STENT PLACEMENT retrograde;  Surgeon: Despina Arias, MD;  Location: [facility] ORS;  Service: Urology;  Laterality: Left;  90 MINUTES NEEDED FOR CASE   PACEMAKER IMPLANT N/A 01/17/2020   Procedure: PACEMAKER IMPLANT;  Surgeon: Sherryl Manges  C, MD;  Location: [place] CV LAB;  Service: Cardiovascular;  Laterality: N/A;   RIGHT/LEFT HEART CATH AND CORONARY ANGIOGRAPHY N/A 09/20/2019   Procedure: RIGHT/LEFT HEART CATH AND CORONARY ANGIOGRAPHY;  Surgeon: Jolaine Artist, MD;  Location: [place] CV LAB;  Service: Cardiovascular;  Laterality: N/A;   shoulder surg rt   1995   SINUS SURGERY WITH INSTATRAK     ethmoidectomy and nasal septum repair   TEE WITHOUT CARDIOVERSION N/A 09/26/2021   Procedure: TRANSESOPHAGEAL ECHOCARDIOGRAM (TEE);  Surgeon: [name], MD;  Location: [place];  Service: Cardiovascular;  Laterality: N/A;   TEE WITHOUT CARDIOVERSION N/A 02/03/2022   Procedure: TRANSESOPHAGEAL ECHOCARDIOGRAM (TEE);  Surgeon: Josue Hector, MD;  Location: [facility] ENDOSCOPY;  Service: Cardiovascular;  Laterality: N/A;    Current Outpatient Medications  Medication Sig Dispense Refill   acetaminophen (TYLENOL) 500 MG tablet Take 1,000 mg by mouth every 6 (six) hours as needed for moderate pain.     albuterol (PROVENTIL HFA;VENTOLIN HFA) 108 (90 BASE) MCG/ACT inhaler Inhale 2 puffs into the lungs every 6 (six) hours as needed for wheezing. 1 Inhaler 11   amiodarone (PACERONE) 200 MG tablet Take 1 tablet (200 mg total) by mouth daily. 60 tablet 0   dapagliflozin propanediol (FARXIGA) 10 MG TABS tablet  Take 1 tablet (10 mg total) by mouth daily. 90 tablet 3   feeding supplement (ENSURE ENLIVE / ENSURE PLUS) LIQD Take 237 mLs by mouth 2 (two) times daily between meals. 10000 mL 0   finasteride (PROSCAR) 5 MG tablet TAKE 1/2 TABLET BY MOUTH DAILY 30 tablet 0   fluticasone-salmeterol (WIXELA INHUB) 250-50 MCG/ACT AEPB Inhale 1 puff into the lungs in the morning and at bedtime.     Melatonin 10 MG CAPS Take 10 mg by mouth at bedtime as needed (sleep).     Multiple Vitamins-Minerals (PRESERVISION AREDS 2 PO) Take 1 tablet by mouth in the morning and at bedtime.     pantoprazole (PROTONIX) 40 MG tablet Take 1 tablet (40 mg total) by mouth daily. 30 tablet 0   PHENobarbital (LUMINAL) 64.8 MG tablet Take 1 tablet (64.8 mg total) by mouth 2 (two) times daily. 180 tablet 3   polyethylene glycol (MIRALAX / GLYCOLAX) 17 g packet Take 17 g by mouth 2 (two) times daily. (Patient taking differently: Take 17 g by mouth daily as needed for moderate constipation.) 60 each 1   pravastatin (PRAVACHOL) 40 MG tablet TAKE 1 TABLET BY MOUTH EVERY DAY IN THE EVENING (Patient taking differently: Take 40 mg by mouth every evening.) 90 tablet 0   sacubitril-valsartan (ENTRESTO) 24-26 MG Take 1 tablet by mouth 2 (two) times daily. 60 tablet 11   Tiotropium Bromide Monohydrate (SPIRIVA RESPIMAT) 2.5 MCG/ACT AERS Inhale 2 each into the lungs every evening.     torsemide (DEMADEX) 20 MG tablet Take 2 tablets (40 mg total) by mouth in the morning. 30 tablet 0   Vitamin D3 (VITAMIN D) 25 MCG tablet Take 1 tablet (1,000 Units total) by mouth daily. 60 tablet 0   warfarin (COUMADIN) 3 MG tablet Take 1 tablet (3 mg total) by mouth daily at 4 PM. (Patient taking differently: Take 3-4.5 mg by mouth See admin instructions. Take 1 tablet by mouth every evening, except on Saturday take one and one half tablets or as directed by Coumadin clinic) 90 tablet 1   No current facility-administered medications for this visit.    Allergies:    Calcium channel blockers and Jardiance [empagliflozin]  Social History:  The patient  reports that he quit smoking about 35 years ago. His smoking use included cigarettes. He has never used smokeless tobacco. He reports that he does not currently use alcohol. He reports that he does not use drugs.   Family History:  The patient's family history includes Heart attack (age of onset: 48) in his father; Heart failure (age of onset: 64) in his mother.  ROS:  Please see the history of present illness.  All other systems are reviewed and otherwise negative.   PHYSICAL EXAM:  VS:  There were no vitals taken for this visit. BMI: There is no height or weight on file to calculate BMI.  A recheck on fhi sBP by myself 50/62 Well nourished, well developed, in no acute distress  HEENT: normocephalic, atraumatic  Neck: no JVD, carotid bruits or masses Cardiac:  RRR; no significant murmurs, no rubs, or gallops Lungs:  CTA b/l, b/l end exp wheezes, no rhonchi or rales  Abd: soft, nontender MS: no deformity or atrophy Ext: trace edema  Skin: warm and dry, no rash Neuro:  No gross deficits appreciated Psych: euthymic mood, full affect  ICD site: stable, skin is intact, no tethering, fluctuation of discomfort   EKG:  done today and reviewed by myself ST/AT 125bpm, with a sinus beat/V paced   Device interrogation done today and reviewed by myself Battery and lead measurements are stable RV lead is programmed subthreshold chronically He is NOT in AFlutter today, has not had any since his ER visit He is in SR/BP rhythm 90's though has frequent 2-6beat ST/AT episodes fairly frequently though as he is seated longer does settle more. Today's data includes his AF burden while hospitalized in September as well BP 80% AF burden 16%    02/03/22: TEE  1. CHF service contacted to see patient and transferred to [facility]  updated.   2. No LAA thrombus found [place] x 1 200 J converted from rapid fib.flutter   rate 122 to NSR with BiV pacing rate 84 bpm.   3. Patient dropped his sats mid 80's and BP 88 mmHg with propofol  Improved post procedure and with 100 ug/min neo Study was performed  expidesiously.   4. Left ventricular ejection fraction, by estimation, is 20 to 25%. The  left ventricle has normal function. The left ventricular internal cavity  size was severely dilated. Left ventricular diastolic function could not  be evaluated.   5. Bi V device leads in RA/RV. Right ventricular systolic function is  severely reduced. The right ventricular size is severely enlarged.   6. Left atrial size was severely dilated. No left atrial/left atrial  appendage thrombus was detected.   7. Right atrial size was severely dilated.   8. The mitral valve is normal in structure. Mild mitral valve  regurgitation.   9. Tricuspid valve regurgitation is moderate.  10. The aortic valve is tricuspid. Aortic valve regurgitation is not  visualized. No aortic stenosis is present.      04/03/21: limited echo Findings:          Pericardium:  No significant pericardial effusion          LV function:  Severely depressed (< 30% EF)          RV:  Normal          IVC collapsibility:  Indeterminate    May 2021: R/LHC Prox LAD to Mid LAD lesion is 25% stenosed. Prox Cx to Dist Cx lesion is  25% stenosed.  Findings:  Ao = 98/69 (81) LV = 101/24 RA = 11 RV = 59/15 PA = 64/23 (40) PCW = 19 Fick cardiac output/index = 5.6/2.6 PVR = 3.5 WU  FA sat = 94% PA sat = 64%, 70%  Assessment:  1. Minimal CAD 2. Nonischemic CM 3. Mild to moderately elevated filling pressures with normal cardiac output   2D Echo 4/21 1. Left ventricular ejection fraction, by estimation, is <20%. The left ventricle has severely decreased function. The left ventricle demonstrates global hypokinesis. The left ventricular internal cavity size was mildly dilated. There is mild concentric left ventricular hypertrophy. Left ventricular  diastolic function could not be evaluated. Elevated left ventricular enddiastolic pressure. 2. Right ventricular systolic function is severely reduced. The right ventricular size is severely enlarged. There is moderately elevated pulmonary artery systolic pressure. 3. The mitral valve is normal in structure. Mild mitral valve regurgitation. No evidence of mitral stenosis. 4. The aortic valve is tricuspid. Aortic valve regurgitation is trivial. Mild to moderate aortic valve sclerosis/calcification is present, without any evidence of aortic stenosis. 5. Aortic dilatation noted. There is mild dilatation of the aortic root and of the ascending aorta measuring 41 mm and 38mm respectively. 6. The inferior vena cava is dilated in size with <50% respiratory variability, suggesting right atrial pressure of 15 mmHg. 7. Left atrial size was severely dilated. 8. Right atrial size was mildly dilated.   Recent Labs: 10/27/2021: TSH 3.345 02/04/2022: ALT 20 02/24/2022: B Natriuretic Peptide 829.3; Hemoglobin 13.8; Magnesium 2.2; Platelets 154 02/27/2022: BUN 21; Creatinine, Ser 1.47; Potassium 5.0; Sodium 142  No results found for requested labs within last 365 days.   CrCl cannot be calculated (Unknown ideal weight.).   Wt Readings from Last 3 Encounters:  02/12/22 219 lb 9.6 oz (99.6 kg)  02/12/22 218 lb 9.6 oz (99.2 kg)  02/05/22 223 lb 1.6 oz (101.2 kg)     Other studies reviewed: Additional studies/records reviewed today include: summarized above  ASSESSMENT AND PLAN:  1. CRT-P Intact function No programming changes made Left bundle lead in LV port RV lead is programmed subthreshold(off)  2. NICM 3. Chronic CHF     BiVe failure     meds with HF team     CorVue is at threshold, stays there     He does not appear volume OL currently      Sees the HF team next week, they will continue as they are until further med clarification next week     Though I did instruct them not to hold  his diuretic unless BP was 110        4. Afib/flutter CHA2DS2Vasc is 4, on warfarin Has struggled with AF and HF this year Amiodarone has been loaded and re-loaded Severe bi-V failure and LA/RV dilation He is not  a good PVI ablation candidate, though will ask Dr. Graciela Husbands to review and d/w colleagues as potential high risk PVI ablation.  He sees HF team next week, hopefully he can get back on BB Continue amiodarone BID for now  08/12/22 late addendum to correct device information Francis Dowse, PAc       Disposition: Will have him see Dr. Graciela Husbands in 4-6 weeks, revisit ? High risk ablation discussion, though seems pt would prefer to avoid procedures if possible.  Current medicines are reviewed at length with the patient today.  The patient did not have any concerns regarding medicines.  Norma Fredrickson, PA-C 03/05/2022 6:48 AM  [place]  [phone] (office)  [phone] (fax)

## 2022-03-05 NOTE — Patient Instructions (Signed)
Medication Instructions:   Your physician recommends that you continue on your current medications as directed. Please refer to the Current Medication list given to you today.   *If you need a refill on your cardiac medications before your next appointment, please call your pharmacy*   Lab [place]    If you have labs (blood work) drawn today and your tests are completely normal, you will receive your results only by: [place] (if you have MyChart) OR A paper copy in the mail If you have any lab test that is abnormal or we need to change your treatment, we will call you to review the results.   Testing/Procedures:NONE ORDERED  TODAY     Follow-Up: At [facility], you and your health needs are our priority.  As part of our continuing mission to provide you with exceptional heart care, we have created designated Provider Care Teams.  These Care Teams include your primary Cardiologist (physician) and Advanced Practice Providers (APPs -  Physician Assistants and Nurse Practitioners) who all work together to provide you with the care you need, when you need it.  We recommend signing up for the patient portal called "MyChart".  Sign up information is provided on this After Visit Summary.  MyChart is used to connect with patients for Virtual Visits (Telemedicine).  Patients are able to view lab/test results, encounter notes, upcoming appointments, etc.  Non-urgent messages can be sent to your provider as well.   To learn more about what you can do with MyChart, go to [email or web address].    Your next appointment:   4-6 week(s)  The format for your next appointment:   In Person  Provider:   You may see Virl Axe, MD  ONLY   Other Instructions   Important Information About [place]

## 2022-03-06 ENCOUNTER — Emergency Department (HOSPITAL_COMMUNITY): Payer: No Typology Code available for payment source

## 2022-03-06 ENCOUNTER — Other Ambulatory Visit: Payer: Self-pay

## 2022-03-06 ENCOUNTER — Emergency Department (HOSPITAL_COMMUNITY)
Admission: EM | Admit: 2022-03-06 | Discharge: 2022-03-06 | Disposition: A | Discharge status: Home / Self Care | Payer: No Typology Code available for payment source | Attending: Emergency Medicine | Admitting: Emergency Medicine

## 2022-03-06 ENCOUNTER — Encounter (HOSPITAL_COMMUNITY): Payer: Self-pay

## 2022-03-06 DIAGNOSIS — I4430 Unspecified atrioventricular block: Secondary | ICD-10-CM | POA: Diagnosis not present

## 2022-03-06 DIAGNOSIS — I130 Hypertensive heart and chronic kidney disease with heart failure and stage 1 through stage 4 chronic kidney disease, or unspecified chronic kidney disease: Secondary | ICD-10-CM | POA: Insufficient documentation

## 2022-03-06 DIAGNOSIS — R0602 Shortness of breath: Secondary | ICD-10-CM | POA: Diagnosis present

## 2022-03-06 DIAGNOSIS — Z79899 Other long term (current) drug therapy: Secondary | ICD-10-CM | POA: Insufficient documentation

## 2022-03-06 DIAGNOSIS — I509 Heart failure, unspecified: Secondary | ICD-10-CM | POA: Diagnosis not present

## 2022-03-06 DIAGNOSIS — I4510 Unspecified right bundle-branch block: Secondary | ICD-10-CM | POA: Insufficient documentation

## 2022-03-06 DIAGNOSIS — N189 Chronic kidney disease, unspecified: Secondary | ICD-10-CM | POA: Diagnosis not present

## 2022-03-06 DIAGNOSIS — Z7984 Long term (current) use of oral hypoglycemic drugs: Secondary | ICD-10-CM | POA: Insufficient documentation

## 2022-03-06 DIAGNOSIS — Z7901 Long term (current) use of anticoagulants: Secondary | ICD-10-CM | POA: Diagnosis not present

## 2022-03-06 DIAGNOSIS — Z7951 Long term (current) use of inhaled steroids: Secondary | ICD-10-CM | POA: Diagnosis not present

## 2022-03-06 DIAGNOSIS — I4892 Unspecified atrial flutter: Secondary | ICD-10-CM | POA: Insufficient documentation

## 2022-03-06 DIAGNOSIS — E1122 Type 2 diabetes mellitus with diabetic chronic kidney disease: Secondary | ICD-10-CM | POA: Diagnosis not present

## 2022-03-06 DIAGNOSIS — J449 Chronic obstructive pulmonary disease, unspecified: Secondary | ICD-10-CM | POA: Diagnosis not present

## 2022-03-06 LAB — BASIC METABOLIC PANEL
Anion gap: 5 (ref 5–15)
BUN: 30 mg/dL — ABNORMAL HIGH (ref 8–23)
CO2: 28 mmol/L (ref 22–32)
Calcium: 9.1 mg/dL (ref 8.9–10.3)
Chloride: 108 mmol/L (ref 98–111)
Creatinine, Ser: 1.69 mg/dL — ABNORMAL HIGH (ref 0.61–1.24)
GFR, Estimated: 41 mL/min — ABNORMAL LOW (ref >60)
Glucose, Bld: 109 mg/dL — ABNORMAL HIGH (ref 70–99)
Potassium: 4.4 mmol/L (ref 3.5–5.1)
Sodium: 141 mmol/L (ref 135–145)

## 2022-03-06 LAB — CBC WITH DIFFERENTIAL/PLATELET
Abs Immature Granulocytes: 0.05 10*3/uL (ref 0.00–0.07)
Basophils Absolute: 0 10*3/uL (ref 0.0–0.1)
Basophils Relative: 1 %
Eosinophils Absolute: 0.1 10*3/uL (ref 0.0–0.5)
Eosinophils Relative: 3 %
HCT: 39.7 % (ref 39.0–52.0)
Hemoglobin: 13.1 g/dL (ref 13.0–17.0)
Immature Granulocytes: 1 %
Lymphocytes Relative: 21 %
Lymphs Abs: 1.1 10*3/uL (ref 0.7–4.0)
MCH: 31.9 pg (ref 26.0–34.0)
MCHC: 33 g/dL (ref 30.0–36.0)
MCV: 96.6 fL (ref 80.0–100.0)
Monocytes Absolute: 0.8 10*3/uL (ref 0.1–1.0)
Monocytes Relative: 16 %
Neutro Abs: 3.1 10*3/uL (ref 1.7–7.7)
Neutrophils Relative %: 58 %
Platelets: 130 10*3/uL — ABNORMAL LOW (ref 150–400)
RBC: 4.11 MIL/uL — ABNORMAL LOW (ref 4.22–5.81)
RDW: 14.9 % (ref 11.5–15.5)
WBC: 5.2 10*3/uL (ref 4.0–10.5)
nRBC: 0 % (ref 0.0–0.2)

## 2022-03-06 LAB — MAGNESIUM: Magnesium: 2.4 mg/dL (ref 1.7–2.4)

## 2022-03-06 LAB — TROPONIN I (HIGH SENSITIVITY)
Troponin I (High Sensitivity): 15 ng/L (ref <18)
Troponin I (High Sensitivity): 16 ng/L (ref <18)

## 2022-03-06 LAB — PROTIME-INR
INR: 1.9 — ABNORMAL HIGH (ref 0.8–1.2)
Prothrombin Time: 21.7 seconds — ABNORMAL HIGH (ref 11.4–15.2)

## 2022-03-06 LAB — BRAIN NATRIURETIC PEPTIDE: B Natriuretic Peptide: 643.4 pg/mL — ABNORMAL HIGH (ref 0.0–100.0)

## 2022-03-06 NOTE — ED Provider Notes (Signed)
[facility] EMERGENCY DEPARTMENT Provider Note   CSN: [id number] Arrival date & time: 03/06/22  1006     History  Chief Complaint  Patient presents with   Atrial Fibrillation    Ricky Hayden is a 76 y.o. male.   Atrial Fibrillation Pertinent negatives include no chest pain and no abdominal pain.  Went to Heart Doctor yesterday and everything was ok. Started having a fast HR ov er 100 last night. Usually checks Vitals BID. HR this morning was greater than 130 and patient decided to come in. Patient also complaining of fatigue and SOB. Patient notes some dizziness when laying down, but this is consistent with his baseline and hx of vertigo.   Patient reports he took all his meds today, and denies any constitutional symptoms.   Patient also notes that his weight has been going up, requiring them to give him his torsemide medication to help manage fluid shift last week.     Home Medications Prior to Admission medications   Medication Sig Start Date End Date Taking? Authorizing Provider  acetaminophen (TYLENOL) 500 MG tablet Take 1,000 mg by mouth every 6 (six) hours as needed for moderate pain.    [provider]  albuterol (PROVENTIL HFA;VENTOLIN HFA) 108 (90 BASE) MCG/ACT inhaler Inhale 2 puffs into the lungs every 6 (six) hours as needed for wheezing. 12/10/14   Maryellen Pile, MD  amiodarone (PACERONE) 200 MG tablet Take 200 mg by mouth 2 (two) times daily.    [provider]  dapagliflozin propanediol (FARXIGA) 10 MG TABS tablet Take 1 tablet (10 mg total) by mouth daily. 02/27/22   Larey Dresser, MD  feeding supplement (ENSURE ENLIVE / ENSURE PLUS) LIQD Take 237 mLs by mouth 2 (two) times daily between meals. 02/05/22   Lavina Hamman, MD  finasteride (PROSCAR) 5 MG tablet TAKE 1/2 TABLET BY MOUTH DAILY 11/28/21   Jennye Boroughs, MD  fluticasone-salmeterol [facility] INHUB) 250-50 MCG/ACT AEPB Inhale 1 puff into the lungs in the morning and  at bedtime.    [provider]  Melatonin 10 MG CAPS Take 10 mg by mouth at bedtime as needed (sleep).    [provider]  Multiple Vitamins-Minerals (PRESERVISION AREDS 2 PO) Take 1 tablet by mouth in the morning and at bedtime.    [provider]  pantoprazole (PROTONIX) 40 MG tablet Take 1 tablet (40 mg total) by mouth daily. 10/10/21   Love, Ivan Anchors, PA-C  PHENobarbital (LUMINAL) 64.8 MG tablet Take 1 tablet (64.8 mg total) by mouth 2 (two) times daily. 07/24/15   Corky Sox, MD  polyethylene glycol (MIRALAX / GLYCOLAX) 17 g packet Take 17 g by mouth 2 (two) times daily. 10/10/21   Love, Ivan Anchors, PA-C  pravastatin (PRAVACHOL) 40 MG tablet TAKE 1 TABLET BY MOUTH EVERY DAY IN THE EVENING 11/28/21   Jennye Boroughs, MD  Tiotropium Bromide Monohydrate (SPIRIVA RESPIMAT) 2.5 MCG/ACT AERS Inhale 2 each into the lungs every evening.    [provider]  Vitamin D3 (VITAMIN D) 25 MCG tablet Take 1 tablet (1,000 Units total) by mouth daily. 10/10/21   Love, Ivan Anchors, PA-C  warfarin (COUMADIN) 3 MG tablet Take 1 tablet (3 mg total) by mouth daily at 4 PM. 12/09/21   Cleaver, Jossie Ng, NP      Allergies    Calcium channel blockers and Jardiance [empagliflozin]    Review of Systems   Review of Systems  Constitutional:  Positive for fatigue and  unexpected weight change. Negative for diaphoresis and fever.  HENT:  Negative for congestion and rhinorrhea.   Respiratory:  Negative for cough.   Cardiovascular:  Negative for chest pain.  Gastrointestinal:  Negative for abdominal pain, diarrhea, nausea and vomiting.  Musculoskeletal:  Negative for myalgias.  Neurological:  Negative for dizziness.    Physical Exam Updated Vital Signs BP 103/78 (BP Location: Right Arm)   Pulse (!) 128   Temp 97.6 F (36.4 C)   Resp 17   Ht 5\' 7"  (1.702 m)   Wt 100.2 kg   SpO2 97%   BMI 34.61 kg/m  Physical Exam Constitutional:      Appearance: Normal appearance. He is obese.   Cardiovascular:     Rate and Rhythm: Normal rate and regular rhythm.     Pulses: Normal pulses.     Heart sounds: Normal heart sounds. No murmur heard.    No friction rub. No gallop.  Pulmonary:     Effort: Pulmonary effort is normal. No respiratory distress.     Breath sounds: Normal breath sounds. No stridor. No wheezing, rhonchi or rales.  Abdominal:     General: Abdomen is flat. There is no distension.     Palpations: Abdomen is soft.     Tenderness: There is no abdominal tenderness.  Skin:    Capillary Refill: Capillary refill takes less than 2 seconds.  Neurological:     Mental Status: He is alert.  Psychiatric:        Mood and Affect: Mood normal.        Behavior: Behavior normal.     ED Results / Procedures / Treatments   Labs (all labs ordered are listed, but only abnormal results are displayed) Labs Reviewed  CBC WITH DIFFERENTIAL/PLATELET  BASIC METABOLIC PANEL  PROTIME-INR  BRAIN NATRIURETIC PEPTIDE  MAGNESIUM    EKG None  Radiology CUP PACEART INCLINIC DEVICE CHECK  Result Date: 03/05/2022 CRT-D device check in office. Thresholds and sensing consistent with previous device measurements. Lead impedance trends stable over time. No ventricular arrhythmia episodes recorded. Patient bi-ventricularly pacing 80__% of the time. Device programmed with appropriate safety margins. Heart failure diagnostics reviewed and trends are stable for patient.  No changes made this session. Estimated longevity _6.9-7.1 years__.  Patient enrolled in remote follow up. see office note for discussion   Procedures Procedures    Medications Ordered in ED Medications - No data to display  ED Course/ Medical Decision Making/ A&P                           Medical Decision Making This patient presents to the ED for concern of A. fib, this involves an extensive number of treatment options, and is a complaint that carries with it a high risk of complications and morbidity.  Ddx  considered A. Fib, A. Flutter, SVT, CHF   Co morbidities that complicate the patient evaluation       cardiomyopathy, CKD, CHF, COPD, hyperlipidemia, hypertension, type 2 diabetes   Additional history obtained: From son and his wife. Also reviewed last cardiology appointment  Patient signed out to attending Dr. Gilford Raid who took over care at shift change  Amount and/or Complexity of Data Reviewed Independent Historian: caregiver    Details: Patient son gave detailed cardiac and medical Hx Radiology: ordered and independent interpretation performed.    Details: CXR was negative for cardiopulmonary process, but did show mild cardiomegaly  ECG/medicine tests: ordered.  Details: ECG showed A. Flutter with AV block           Final Clinical Impression(s) / ED Diagnoses Final diagnoses:  None    Rx / DC Orders ED Discharge Orders     None         Holley Bouche, MD 03/06/22 1213    Isla Pence, MD 03/06/22 [phone]

## 2022-03-06 NOTE — ED Provider Triage Note (Signed)
Emergency Medicine Provider Triage Evaluation Note  Ricky Hayden , a 76 y.o. male  was evaluated in triage.  Pt complains of rapid heart rate.  Started today.  He is on Coumadin for A-fib, also reports 6 pound weight gain in 1 week.  Denies any chest pain..  Review of Systems  Per HPI  Physical Exam  BP 103/78 (BP Location: Right Arm)   Pulse (!) 128   Temp 97.6 F (36.4 C)   Resp 17   Ht 5\' 7"  (1.702 m)   Wt 100.2 kg   SpO2 97%   BMI 34.61 kg/m  Gen:   Awake, no distress   Resp:  Normal effort  MSK:   Moves extremities without difficulty  Other:  Tachycardic, regular rhythm   Medical Decision Making  Medically screening exam initiated at 10:23 AM.  Appropriate orders placed.  Dnaiel Voller was informed that the remainder of the evaluation will be completed by another provider, this initial triage assessment does not replace that evaluation, and the importance of remaining in the ED until their evaluation is complete.     Sherrill Raring, PA-C 03/06/22 1023

## 2022-03-06 NOTE — Discharge Instructions (Signed)
1.  Continue your regular medications and follow-up with your cardiologist as soon as possible. 2.  Return to the emergency department if you have new worsening or concerning symptoms.

## 2022-03-06 NOTE — ED Provider Notes (Signed)
Cardio to see and cardiovert. Possible d/c home after. Physical Exam  BP 128/72   Pulse (!) 131   Temp 98 F (36.7 C)   Resp 17   Ht 5\' 7"  (1.702 m)   Wt 100.2 kg   SpO2 99%   BMI 34.61 kg/m   Physical Exam  Procedures  Procedures  ED Course / MDM    Medical Decision Making  EP came down to assess patient.  He was no longer in atrial flutter.  I have followed up with Jonni Sanger, and patient is stable for discharge.  I have checked the patient.  He is alert nontoxic.  No distress.  Monitor shows sinus rhythm with intermittent PACs rate controlled.  Patient reports he is ready to go and feels back to normal.       Charlesetta Shanks, MD 03/06/22 [phone]

## 2022-03-06 NOTE — ED Triage Notes (Signed)
Pt arrived POV from home c/o having a hx of a-fib. Pt states he checks his BP and Pulse twice a day and today when they checked it his HR was in the 130's. Pt denies any CP.

## 2022-03-10 ENCOUNTER — Ambulatory Visit (INDEPENDENT_AMBULATORY_CARE_PROVIDER_SITE_OTHER): Payer: No Typology Code available for payment source

## 2022-03-10 DIAGNOSIS — Z5181 Encounter for therapeutic drug level monitoring: Secondary | ICD-10-CM | POA: Diagnosis not present

## 2022-03-10 LAB — POCT INR: INR: 1.9 — AB (ref 2.0–3.0)

## 2022-03-11 NOTE — Progress Notes (Incomplete)
ADVANCED HF CLINIC NOTE   Primary Care: [facility] EP: Dr. Caryl Comes HF Cardiologist: Dr. Haroldine Laws  HPI: Ricky Hayden is a 76 y.o. 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    EF 45-50% in 2016, 30-35% in 2017 and 2020.   CM previously felt to be d/t combination of LBBB and recurrent AF.   Admitted 4/10 to 08/09/19 with COVID PNA and HF. Echo repeated and EF back down to 20% w/ severe RV dysfunction. He recovered from [place] but unfortunately, his wife died from [place].    Underwent CRT-P in September 2021.   He was admitted to [facility] in 12/22 with a/c CHF and atrial flutter. Underwent successful DCCV. Readmitted in 04/23 with acute respiratory failure, urosepsis and proteus bacteremia. Echo during admit with EF 40-45%. Course further c/b brief PEA arrest, aspiration PNA/HCAP, and atrial flutter with RVR. Underwent successful DCCV. EF 30-35% on TEE.    Admitted again in June 2023 with pyelonephritis and Jardiance stopped. Had afib with RVR but converted to SR spontaneously.    Admitted 9/23 with acute hypoxic respiratory failure secondary to a/c CHF and AF with RVR. Started on IV amiodarone and IV lasix.  Underwent TEE guided DCCV to SR. TEE demonstrated EF 20-25%, RV severely reduced, moderate TR.  Transient hypotension post TEE requiring pressors. Drips weaned and GDMT restarted.  Discharged home, weight 223 lbs.  Seen for hospital f/u 02/12/22. Low-dose entresto restarted.   Seen in ED 02/24/22 with atrial flutter with RVR. EP was consulted. Successfully converted to SR with overdriving pacing. Amiodarone increased to 200 mg BID.  Note felt to be good candidate for PVI ablation. Planning to see Dr. Caryl Comes next month to see if potential candidate for high risk PVI ablation.  Returned to ED 03/06/22 with recurrent  atrial flutter. Cardioversion planned but converted to SR while in ED.  He is here today for hospital f/u. There has been quite a bit of confusion with medications after multiple recent admits and medication changes. His daughter, who is not present today, arranges his meds in an Environmental education officer for him. He has been taking torsemide about 2-3 days a week if SBP > 110. Confirmed he is taking amiodarone 200 mg BID and entresto 24/26 mg BID. Reports difficulty getting refills on Farxiga through the [place].   His weight has been stable between 218-220 lb. Has chronic dyspnea with exertion, no recent change in symptoms. Denies orthopnea or PND. Has a little bit of chronic left lower extremity edema, no worse than usual. Follows a fluid restriction, occasionally may eat more sodium than he should.   Cardiac Studies  - TEE (10/23): EF 20-25%, RV severely reduced, moderate TR, no LAA  - Echo (4/23): EF 40-45%, LV mildly decreased, grade I DD, RV ok  - Echo (4/21): EF 20%, severe RV dysfunction (had COVID PNA)  - Echo (2020): EF 30-35%  - Echo (2017): EF 30-35%  - Echo (2016): EF 45-50%  Past Medical History:  Diagnosis Date   Arthritis    osteoarthritis of left knee   Ascending aorta dilatation (HCC)    Balanitis    recurrent   Cardiac arrhythmia    life threatening, secondary to CCB vs b- blockers   Cardiomyopathy ([place])    Chronic joint pain    Chronic systolic CHF (congestive heart failure) ([place])  CKD (chronic kidney disease), stage III (HCC)    COPD (chronic obstructive pulmonary disease) (HCC)    Coronary artery disease    Dilated aortic root (HCC)    Dyspnea    Enlarged prostate    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 Sherwood Gambler   Nocturia    Obesity     Pansinusitis    a.  complicated by brain abscess and bleeding requiring craniotomy in 1995.   PONV (postoperative nausea and vomiting)    Presence of permanent cardiac pacemaker    Sinusitis    s/p ethmoidectomy and nasal septoplasty   Vertigo    intermitantly    Current Outpatient Medications  Medication Sig Dispense Refill   acetaminophen (TYLENOL) 500 MG tablet Take 1,000 mg by mouth every 6 (six) hours as needed for moderate pain.     albuterol (PROVENTIL HFA;VENTOLIN HFA) 108 (90 BASE) MCG/ACT inhaler Inhale 2 puffs into the lungs every 6 (six) hours as needed for wheezing. 1 Inhaler 11   feeding supplement (ENSURE ENLIVE / ENSURE PLUS) LIQD Take 237 mLs by mouth 2 (two) times daily between meals. 10000 mL 0   finasteride (PROSCAR) 5 MG tablet TAKE 1/2 TABLET BY MOUTH DAILY 30 tablet 0   fluticasone-salmeterol (WIXELA INHUB) 250-50 MCG/ACT AEPB Inhale 1 puff into the lungs in the morning and at bedtime.     Melatonin 10 MG CAPS Take 10 mg by mouth at bedtime as needed (sleep).     Multiple Vitamins-Minerals (PRESERVISION AREDS 2 PO) Take 1 tablet by mouth in the morning and at bedtime.     pantoprazole (PROTONIX) 40 MG tablet Take 1 tablet (40 mg total) by mouth daily. 30 tablet 0   PHENobarbital (LUMINAL) 64.8 MG tablet Take 1 tablet (64.8 mg total) by mouth 2 (two) times daily. 180 tablet 3   polyethylene glycol (MIRALAX / GLYCOLAX) 17 g packet Take 17 g by mouth 2 (two) times daily. 60 each 1   pravastatin (PRAVACHOL) 40 MG tablet TAKE 1 TABLET BY MOUTH EVERY DAY IN THE EVENING 90 tablet 0   sacubitril-valsartan (ENTRESTO) 24-26 MG Take 1 tablet by mouth 2 (two) times daily. For heart failure 60 tablet 8   spironolactone (ALDACTONE) 25 MG tablet Take 0.5 tablets (12.5 mg total) by mouth daily. 45 tablet 3   Tiotropium Bromide Monohydrate (SPIRIVA RESPIMAT) 2.5 MCG/ACT AERS Inhale 2 each into the lungs every evening.     Vitamin D3 (VITAMIN D) 25 MCG tablet Take 1 tablet (1,000 Units  total) by mouth daily. 60 tablet 0   warfarin (COUMADIN) 3 MG tablet Take 1 tablet (3 mg total) by mouth daily at 4 PM. 90 tablet 1   amiodarone (PACERONE) 200 MG tablet Take 1 tablet (200 mg total) by mouth 2 (two) times daily. 60 tablet 7   dapagliflozin propanediol (FARXIGA) 10 MG TABS tablet Take 1 tablet (10 mg total) by mouth daily. For heart failure 90 tablet 3   [START ON 03/13/2022] torsemide (DEMADEX) 20 MG tablet Take 1 tablet (20 mg total) by mouth every Monday, Wednesday, and Friday. 15 tablet 7   No current facility-administered medications for this encounter.   Allergies  Allergen Reactions   Calcium Channel Blockers Other (  See Comments)    Came to hospital in 1995-caused chest pain    Jardiance [Empagliflozin] Other (See Comments)    reacted with phenobarbital (caused UTI)   Social History   Socioeconomic History   Marital status: Widowed    Spouse name: Not on file   Number of children: Not on file   Years of education: Not on file   Highest education level: Not on file  Occupational History   Not on file  Tobacco Use   Smoking status: Former    Types: Cigarettes    Quit date: 05/04/1986    Years since quitting: 35.8   Smokeless tobacco: Never   Tobacco comments:    Former smoker 02/12/22  Vaping Use   Vaping Use: Never used  Substance and Sexual Activity   Alcohol use: Not Currently   Drug use: No   Sexual activity: Yes  Other Topics Concern   Not on file  Social History Narrative   Not on file   Social Determinants of Health   Financial Resource Strain: Not on file  Food Insecurity: No Food Insecurity (02/01/2022)   Hunger Vital Sign    Worried About Running Out of Food in the Last Year: Never true    Ran Out of Food in the Last Year: Never true  Transportation Needs: No Transportation Needs (02/01/2022)   PRAPARE - Hydrologist (Medical): No    Lack of Transportation (Non-Medical): No  Physical Activity: Not on file   Stress: Not on file  Social Connections: Not on file  Intimate Partner Violence: Not At Risk (02/01/2022)   Humiliation, Afraid, Rape, and Kick questionnaire    Fear of Current or Ex-Partner: No    Emotionally Abused: No    Physically Abused: No    Sexually Abused: No   Family History  Problem Relation Age of Onset   Heart failure Mother 77   Heart attack Father 48   BP 110/80   Pulse 82   Ht 5\' 7"  (1.702 m)   Wt 99.8 kg (220 lb)   SpO2 96%   BMI 34.46 kg/m   Wt Readings from Last 3 Encounters:  03/12/22 99.8 kg (220 lb)  03/06/22 100.2 kg (221 lb)  03/05/22 100.6 kg (221 lb 12.8 oz)   PHYSICAL EXAM: General:  Well appearing.  HEENT: normal Neck: supple. no JVD. Carotids 2+ bilat; no bruits.  Cor: PMI nondisplaced. Regular rate & rhythm. No rubs, gallops or murmurs. Lungs: clear Abdomen: obese, soft, nontender, nondistended.  Extremities: no cyanosis, clubbing, rash, trace edema Neuro: alert & orientedx3, cranial nerves grossly intact. moves all 4 extremities w/o difficulty. Affect pleasant   Device interrogation: Unable to download device interrogation in clinic. Rep emailing copy to our office for review.  ASSESSMENT & PLAN: Chronic biventricular heart failure/NICM: - cardiomyopathy dates back to at least 2016.  - EF variable over the years. CM previously thought to be d/t LBBB +/- recurrent AF.  - S/p CRT-P (9/21). - Multiple admissions for a/c CHF and recurrent AF this past year - Echo (2021): EF < 20% . LHC with no significant CAD. - Echo (4/23): EF 40-45% - Echo (09/26/21): EF 30-35% in setting of AF - TEE (10/23): EF 20-25%, RV severely reduced, severe BAE, moderate TR - NYHA II-early III, confounded by body habitus and deconditioning. Volume looks okay on exam. Will look at CorVue once device interrogation available - Taking torsemide 20 mg if BP > 110, ends up taking  2-3 days a week. Recommended he not dose based on BP alone. Volume looks good today so will  have him continue taking three days a week (M, W, F).  - Continue Entresto 24/26 mg BID - Notes difficulty getting [place] from [place]. Will resubmit Rx for 10 mg daily.  - Start Spiro 12.5 mg daily. Given short supply to local pharmacy and submitted to [place]. - Beta blocker next - BMET last week stable. Check BMET again in 1 week.   2. Atrial fibrillation/flutter: - Known history with multiple prior cardioversions - Multiple recent admits with Afib and flutter - S/p TEE/DCCV to SR in 10/23. Had recurrent AFL and had overdrive pacing in ED 16/10/96 - Most recently in ED 03/06/22. Converted to SR while in ED - Regular rhythm on exam today. Interrogating device today. - On amiodarone 200 mg BID per EP - Has f/u with Dr. Caryl Comes to see if he would be a candidate for high-risk PVI ablation - CHA2DS2-VASc score is 6 - On coumadin. Not on DOAC d/t interaction with phenobarbital use (see below) - INR followed by [facility].   3. CKD 3a: - baseline SCr 1.5-1.8 recently - labs stable in ED last week  4. H/o brain abscess - S/p craniotomy 2015 - Has been on phenobarbital since 1995, no seizures. - He wants to stop warfarin and switch to DOAC. Has not been on DOAC due to interaction w/ phenobarb. - Discuss with PharmD, Eliquis and Xarelto are contraindicated, but technically edoxaban could be used. He needs to follow up with Neuro if they are OK stopping phenobarb.    There was quite a bit of confusion with his medications at today's visit. He's had multiple recent changes to his regimen by different providers and several family members involved with helping him manage medications.  Suggested he consider paramedicine, at least for short term until we can get him on a stable regimen. He declined today. Will need to reconsider at f/u if still having trouble managing.   ADDENDUM: Device check received after patient left clinic. Last episode AF 11/08, lasting 6 hrs/36 min. CorVue thoracic impedance  above threshold, does not suggest volume overload.    Follow up 4 weeks with APP, add low dose beta blocker at that time.  Marlyce Huge, PA-C 03/12/22

## 2022-03-11 NOTE — Telephone Encounter (Signed)
Called to confirm/remind patient of their appointment at the [facility] on 03/12/12.   Patient reminded to bring all medications and/or complete list.  Confirmed patient has transportation. Gave directions, instructed to utilize [place] parking.  Confirmed appointment prior to ending call.

## 2022-03-12 ENCOUNTER — Other Ambulatory Visit (HOSPITAL_COMMUNITY): Payer: Non-veteran care

## 2022-03-12 ENCOUNTER — Ambulatory Visit (HOSPITAL_COMMUNITY)
Admission: RE | Admit: 2022-03-12 | Discharge: 2022-03-12 | Disposition: A | Discharge status: Home / Self Care | Payer: No Typology Code available for payment source | Source: Ambulatory Visit | Attending: Physician Assistant | Admitting: Physician Assistant

## 2022-03-12 ENCOUNTER — Encounter (HOSPITAL_COMMUNITY): Payer: Self-pay

## 2022-03-12 VITALS — BP 110/80 | HR 82 | Ht 67.0 in | Wt 220.0 lb

## 2022-03-12 DIAGNOSIS — E1122 Type 2 diabetes mellitus with diabetic chronic kidney disease: Secondary | ICD-10-CM | POA: Diagnosis not present

## 2022-03-12 DIAGNOSIS — N1831 Chronic kidney disease, stage 3a: Secondary | ICD-10-CM | POA: Diagnosis not present

## 2022-03-12 DIAGNOSIS — I5082 Biventricular heart failure: Secondary | ICD-10-CM | POA: Insufficient documentation

## 2022-03-12 DIAGNOSIS — Z8616 Personal history of COVID-19: Secondary | ICD-10-CM | POA: Insufficient documentation

## 2022-03-12 DIAGNOSIS — Z8661 Personal history of infections of the central nervous system: Secondary | ICD-10-CM | POA: Diagnosis not present

## 2022-03-12 DIAGNOSIS — E785 Hyperlipidemia, unspecified: Secondary | ICD-10-CM | POA: Diagnosis not present

## 2022-03-12 DIAGNOSIS — I5022 Chronic systolic (congestive) heart failure: Secondary | ICD-10-CM | POA: Insufficient documentation

## 2022-03-12 DIAGNOSIS — Z8679 Personal history of other diseases of the circulatory system: Secondary | ICD-10-CM | POA: Insufficient documentation

## 2022-03-12 DIAGNOSIS — Z7984 Long term (current) use of oral hypoglycemic drugs: Secondary | ICD-10-CM | POA: Diagnosis not present

## 2022-03-12 DIAGNOSIS — I447 Left bundle-branch block, unspecified: Secondary | ICD-10-CM | POA: Diagnosis not present

## 2022-03-12 DIAGNOSIS — I428 Other cardiomyopathies: Secondary | ICD-10-CM | POA: Diagnosis not present

## 2022-03-12 DIAGNOSIS — I4891 Unspecified atrial fibrillation: Secondary | ICD-10-CM | POA: Diagnosis not present

## 2022-03-12 DIAGNOSIS — J440 Chronic obstructive pulmonary disease with acute lower respiratory infection: Secondary | ICD-10-CM | POA: Insufficient documentation

## 2022-03-12 DIAGNOSIS — I4892 Unspecified atrial flutter: Secondary | ICD-10-CM | POA: Insufficient documentation

## 2022-03-12 DIAGNOSIS — Z7901 Long term (current) use of anticoagulants: Secondary | ICD-10-CM | POA: Insufficient documentation

## 2022-03-12 DIAGNOSIS — Z79899 Other long term (current) drug therapy: Secondary | ICD-10-CM | POA: Insufficient documentation

## 2022-03-12 DIAGNOSIS — I480 Paroxysmal atrial fibrillation: Secondary | ICD-10-CM | POA: Diagnosis not present

## 2022-03-12 DIAGNOSIS — Z9889 Other specified postprocedural states: Secondary | ICD-10-CM | POA: Insufficient documentation

## 2022-03-12 DIAGNOSIS — I130 Hypertensive heart and chronic kidney disease with heart failure and stage 1 through stage 4 chronic kidney disease, or unspecified chronic kidney disease: Secondary | ICD-10-CM | POA: Diagnosis not present

## 2022-03-12 MED ORDER — DAPAGLIFLOZIN PROPANEDIOL 10 MG PO TABS: 10.0000 mg | ORAL_TABLET | Freq: Every day | ORAL | 3 refills | Status: DC

## 2022-03-12 MED ORDER — TORSEMIDE 20 MG PO TABS: 20.0000 mg | ORAL_TABLET | ORAL | 7 refills | Status: DC

## 2022-03-12 MED ORDER — ENTRESTO 24-26 MG PO TABS: 1.0000 | ORAL_TABLET | Freq: Two times a day (BID) | ORAL | 8 refills | Status: DC

## 2022-03-12 MED ORDER — AMIODARONE HCL 200 MG PO TABS: 200.0000 mg | ORAL_TABLET | Freq: Two times a day (BID) | ORAL | 7 refills | Status: DC

## 2022-03-12 MED ORDER — SPIRONOLACTONE 25 MG PO TABS: 12.5000 mg | ORAL_TABLET | Freq: Every day | ORAL | 3 refills | Status: DC

## 2022-03-12 NOTE — Addendum Note (Signed)
Encounter addended by: Joette Catching, PA-C on: 03/12/2022 6:07 PM  Actions taken: Clinical Note Signed

## 2022-03-12 NOTE — Patient Instructions (Addendum)
Thank you for coming in today  Labs were done today, if any labs are abnormal the clinic will call you No news is good news   RESTART Spironolactone 12.5 mg 1/2 daily  Farxiga 10 mg 1 tablet daily  Torsemide 20 mg every Monday Wednesday and Friday  Your physician recommends that you return for lab work in:  1 week bmet  Your physician recommends that you schedule a follow-up appointment in:  4 week in clinic    Do the following things EVERYDAY: Weigh yourself in the morning before breakfast. Write it down and keep it in a log. Take your medicines as prescribed Eat low salt foods--Limit salt (sodium) to 2000 mg per day.  Stay as active as you can everyday Limit all fluids for the day to less than 2 liters  At the [facility], you and your health needs are our priority. As part of our continuing mission to provide you with exceptional heart care, we have created designated Provider Care Teams. These Care Teams include your primary Cardiologist (physician) and Advanced Practice Providers (APPs- Physician Assistants and Nurse Practitioners) who all work together to provide you with the care you need, when you need it.   You may see any of the following providers on your designated Care Team at your next follow up: Dr Glori Bickers Dr Loralie Champagne Dr. Roxana Hires, NP Lyda Jester, [place] [facility] [place], [place] Forestine Na, NP Audry Riles, PharmD   Please be sure to bring in all your medications bottles to every appointment.   If you have any questions or concerns before your next appointment please send [place] a message through [place] or call our office at [phone].    TO LEAVE A MESSAGE FOR THE NURSE SELECT OPTION 2, PLEASE LEAVE A MESSAGE INCLUDING: YOUR NAME DATE OF BIRTH CALL BACK NUMBER REASON FOR CALL**this is important as we prioritize the call backs  YOU WILL RECEIVE A CALL BACK THE SAME DAY AS LONG AS YOU CALL  BEFORE 4:00 PM

## 2022-03-23 ENCOUNTER — Telehealth: Payer: Self-pay | Admitting: Cardiology

## 2022-03-23 ENCOUNTER — Other Ambulatory Visit (HOSPITAL_COMMUNITY): Payer: Self-pay | Admitting: *Deleted

## 2022-03-23 MED ORDER — EMPAGLIFLOZIN 10 MG PO TABS: 10.0000 mg | ORAL_TABLET | Freq: Every day | ORAL | 3 refills | Status: DC

## 2022-03-23 NOTE — Telephone Encounter (Signed)
Ricky Hayden from [facility] states she has been sending orders for INR checks, coumadin, and POC and she hasn't heard anything back.

## 2022-03-23 NOTE — Telephone Encounter (Signed)
Forms scanned under media.   Will make primary nurse aware.  Spoke to [employer] and verbalized understanding.  She also just faxed the most recent forms.

## 2022-03-24 ENCOUNTER — Telehealth: Payer: Self-pay | Admitting: Internal Medicine

## 2022-03-24 ENCOUNTER — Ambulatory Visit (INDEPENDENT_AMBULATORY_CARE_PROVIDER_SITE_OTHER): Payer: No Typology Code available for payment source | Admitting: *Deleted

## 2022-03-24 DIAGNOSIS — I480 Paroxysmal atrial fibrillation: Secondary | ICD-10-CM

## 2022-03-24 DIAGNOSIS — Z5181 Encounter for therapeutic drug level monitoring: Secondary | ICD-10-CM

## 2022-03-24 LAB — POCT INR: INR: 1.4 — AB (ref 2.0–3.0)

## 2022-03-24 MED ORDER — WARFARIN SODIUM 3 MG PO TABS: ORAL_TABLET | ORAL | 2 refills | Status: DC

## 2022-03-24 NOTE — Telephone Encounter (Signed)
Prescription refill request received for warfarin [place]: 03/12/22 (CHF) Next INR check: 03/31/22 Warfarin tablet strength: 3mg   Appropriate dose and refill sent to requested pharmacy.

## 2022-03-24 NOTE — Telephone Encounter (Signed)
*  STAT* If patient is at the pharmacy, call can be transferred to refill team.   1. Which medications need to be refilled? (please list name of each medication and dose if known)   warfarin (COUMADIN) 3 MG table    2. Which pharmacy/location (including street and city if local pharmacy) is medication to be sent to? [id number] - [place], [place] - [address]   3. Do they need a 30 day or 90 day supply?  30 day    Pt's niece would also like a callback regarding [place] prescription

## 2022-03-24 NOTE — Telephone Encounter (Signed)
Spoke with pt's niece, Larene Beach who states pt was started back on Farxiga and the [place] clinic does not have this medication. Niece is not listed on DPR and advised she will need to request to be added by pt.  Advised niece pt will need to contact CHF clinic for further assistance with [place] and phone number provided of [phone].

## 2022-03-25 ENCOUNTER — Telehealth (HOSPITAL_COMMUNITY): Payer: Self-pay

## 2022-03-25 NOTE — Telephone Encounter (Signed)
Called to confirm/remind patient of their appointment at the [facility] on 03/30/22.   Patient reminded to bring all medications and/or complete list.  Confirmed patient has transportation. Gave directions, instructed to utilize [facility] parking.  Confirmed appointment prior to ending call.

## 2022-03-25 NOTE — Telephone Encounter (Signed)
Orders signed and faxed.

## 2022-03-30 ENCOUNTER — Encounter (HOSPITAL_COMMUNITY): Payer: Self-pay

## 2022-03-30 ENCOUNTER — Encounter: Payer: Self-pay | Admitting: Neurology

## 2022-03-30 ENCOUNTER — Ambulatory Visit (HOSPITAL_COMMUNITY)
Admission: RE | Admit: 2022-03-30 | Discharge: 2022-03-30 | Disposition: A | Discharge status: Home / Self Care | Payer: No Typology Code available for payment source | Source: Ambulatory Visit | Attending: Family Medicine | Admitting: Family Medicine

## 2022-03-30 VITALS — BP 138/72 | HR 99 | Resp 96 | Wt 226.2 lb

## 2022-03-30 DIAGNOSIS — E785 Hyperlipidemia, unspecified: Secondary | ICD-10-CM | POA: Insufficient documentation

## 2022-03-30 DIAGNOSIS — G060 Intracranial abscess and granuloma: Secondary | ICD-10-CM

## 2022-03-30 DIAGNOSIS — J324 Chronic pansinusitis: Secondary | ICD-10-CM | POA: Diagnosis not present

## 2022-03-30 DIAGNOSIS — Z9889 Other specified postprocedural states: Secondary | ICD-10-CM | POA: Insufficient documentation

## 2022-03-30 DIAGNOSIS — E1122 Type 2 diabetes mellitus with diabetic chronic kidney disease: Secondary | ICD-10-CM | POA: Insufficient documentation

## 2022-03-30 DIAGNOSIS — J440 Chronic obstructive pulmonary disease with acute lower respiratory infection: Secondary | ICD-10-CM | POA: Insufficient documentation

## 2022-03-30 DIAGNOSIS — Z79899 Other long term (current) drug therapy: Secondary | ICD-10-CM | POA: Insufficient documentation

## 2022-03-30 DIAGNOSIS — J1282 Pneumonia due to coronavirus disease 2019: Secondary | ICD-10-CM | POA: Diagnosis not present

## 2022-03-30 DIAGNOSIS — N1831 Chronic kidney disease, stage 3a: Secondary | ICD-10-CM | POA: Diagnosis not present

## 2022-03-30 DIAGNOSIS — Z7901 Long term (current) use of anticoagulants: Secondary | ICD-10-CM | POA: Insufficient documentation

## 2022-03-30 DIAGNOSIS — I428 Other cardiomyopathies: Secondary | ICD-10-CM | POA: Insufficient documentation

## 2022-03-30 DIAGNOSIS — I5042 Chronic combined systolic (congestive) and diastolic (congestive) heart failure: Secondary | ICD-10-CM | POA: Diagnosis not present

## 2022-03-30 DIAGNOSIS — I480 Paroxysmal atrial fibrillation: Secondary | ICD-10-CM | POA: Diagnosis not present

## 2022-03-30 DIAGNOSIS — I5082 Biventricular heart failure: Secondary | ICD-10-CM | POA: Insufficient documentation

## 2022-03-30 DIAGNOSIS — Z8661 Personal history of infections of the central nervous system: Secondary | ICD-10-CM | POA: Insufficient documentation

## 2022-03-30 DIAGNOSIS — Z87891 Personal history of nicotine dependence: Secondary | ICD-10-CM | POA: Insufficient documentation

## 2022-03-30 DIAGNOSIS — I4892 Unspecified atrial flutter: Secondary | ICD-10-CM | POA: Insufficient documentation

## 2022-03-30 DIAGNOSIS — I447 Left bundle-branch block, unspecified: Secondary | ICD-10-CM | POA: Diagnosis not present

## 2022-03-30 DIAGNOSIS — N183 Chronic kidney disease, stage 3 unspecified: Secondary | ICD-10-CM | POA: Diagnosis not present

## 2022-03-30 DIAGNOSIS — Z950 Presence of cardiac pacemaker: Secondary | ICD-10-CM | POA: Insufficient documentation

## 2022-03-30 DIAGNOSIS — I130 Hypertensive heart and chronic kidney disease with heart failure and stage 1 through stage 4 chronic kidney disease, or unspecified chronic kidney disease: Secondary | ICD-10-CM | POA: Diagnosis not present

## 2022-03-30 DIAGNOSIS — R410 Disorientation, unspecified: Secondary | ICD-10-CM | POA: Insufficient documentation

## 2022-03-30 LAB — BASIC METABOLIC PANEL
Anion gap: 9 (ref 5–15)
BUN: 39 mg/dL — ABNORMAL HIGH (ref 8–23)
CO2: 23 mmol/L (ref 22–32)
Calcium: 9.1 mg/dL (ref 8.9–10.3)
Chloride: 109 mmol/L (ref 98–111)
Creatinine, Ser: 1.62 mg/dL — ABNORMAL HIGH (ref 0.61–1.24)
GFR, Estimated: 43 mL/min — ABNORMAL LOW (ref >60)
Glucose, Bld: 95 mg/dL (ref 70–99)
Potassium: 4.6 mmol/L (ref 3.5–5.1)
Sodium: 141 mmol/L (ref 135–145)

## 2022-03-30 MED ORDER — CARVEDILOL 6.25 MG PO TABS: 6.2500 mg | ORAL_TABLET | Freq: Two times a day (BID) | ORAL | 0 refills | Status: DC

## 2022-03-30 MED ORDER — EMPAGLIFLOZIN 25 MG PO TABS: 25.0000 mg | ORAL_TABLET | Freq: Every day | ORAL | 7 refills | Status: DC

## 2022-03-30 MED ORDER — CARVEDILOL 6.25 MG PO TABS: 6.2500 mg | ORAL_TABLET | Freq: Two times a day (BID) | ORAL | 3 refills | Status: DC

## 2022-03-30 NOTE — Addendum Note (Signed)
Encounter addended by: Payton Mccallum, RN on: 03/30/2022 3:26 PM  Actions taken: Order list changed, Diagnosis association updated

## 2022-03-30 NOTE — Progress Notes (Addendum)
ADVANCED HF CLINIC NOTE   Primary Care: [facility], [facility] EP: Dr. Caryl Comes HF Cardiologist: Dr. Haroldine Laws  HPI: Ricky Hayden is a 76 y.o. 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    EF 45-50% in 2016, 30-35% in 2017 and 2020.   CM previously felt to be d/t combination of LBBB and recurrent AF.   Admitted 4/10 to 08/09/19 with COVID PNA and HF. Echo repeated and EF back down to 20% w/ severe RV dysfunction. He recovered from [place] but unfortunately, his wife died from [place].    Underwent CRT-P in September 2021.   He was admitted to [facility] in 12/22 with a/c CHF and atrial flutter. Underwent successful DCCV. Readmitted in 04/23 with acute respiratory failure, urosepsis and proteus bacteremia. Echo during admit with EF 40-45%. Course further c/b brief PEA arrest, aspiration PNA/HCAP, and atrial flutter with RVR. Underwent successful DCCV. EF 30-35% on TEE.    Admitted again in June 2023 with pyelonephritis and Jardiance stopped. Had afib with RVR but converted to SR spontaneously.    Admitted 9/23 with acute hypoxic respiratory failure secondary to a/c CHF and AF with RVR. Started on IV amiodarone and IV lasix.  Underwent TEE guided DCCV to SR. TEE demonstrated EF 20-25%, RV severely reduced, moderate TR.  Transient hypotension post TEE requiring pressors. Drips weaned and GDMT restarted.  Discharged home, weight 223 lbs.  Seen for hospital f/u 02/12/22. Low-dose Entresto restarted.   Seen in ED 02/24/22 with atrial flutter with RVR. EP was consulted. Successfully converted to SR with overdriving pacing. Amiodarone increased to 200 mg BID.  Note felt to be good candidate for PVI ablation. Planning to see Dr. Caryl Comes next month to see if potential candidate for high risk PVI ablation.  Returned to ED 03/06/22 with recurrent  atrial flutter. Cardioversion planned but converted to SR while in ED.  Follow up 11/23, volume stable with NYHA II-III and volume stable. Confusion over medications, but he declined paramedicine.   Today he returns for HF follow up with his daughter. Overall feeling fine. He is not SOB walking further on flat ground with his rolling walker.  Denies palpitations, abnormal bleeding, CP, dizziness, edema, or PND/Orthopnea. Appetite ok. No fever or chills. Weight at home 223 pounds. Taking all medications but out of Farxiga x 1 month.    Cardiac Studies  - TEE (10/23): EF 20-25%, RV severely reduced, moderate TR, no LAA  - Echo (4/23): EF 40-45%, LV mildly decreased, grade I DD, RV ok  - Echo (4/21): EF 20%, severe RV dysfunction (had COVID PNA)  - Echo (2020): EF 30-35%  - Echo (2017): EF 30-35%  - Echo (2016): EF 45-50%  Past Medical History:  Diagnosis Date   Arthritis    osteoarthritis of left knee   Ascending aorta dilatation (HCC)    Balanitis    recurrent   Cardiac arrhythmia    life threatening, secondary to CCB vs b- blockers   Cardiomyopathy ([place])    Chronic joint pain    Chronic systolic CHF (congestive heart failure) (HCC)    CKD (chronic kidney disease), stage III (HCC)    COPD (chronic obstructive pulmonary disease) ([place])    Coronary artery disease    Dilated aortic root (HCC)    Dyspnea    Enlarged prostate    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 Sherwood Gambler   Nocturia    Obesity    Pansinusitis    a.  complicated by brain abscess and bleeding requiring craniotomy in 1995.   PONV (postoperative nausea and vomiting)    Presence of permanent cardiac pacemaker    Sinusitis    s/p ethmoidectomy and nasal septoplasty   Vertigo     intermitantly    Current Outpatient Medications  Medication Sig Dispense Refill   acetaminophen (TYLENOL) 500 MG tablet Take 1,000 mg by mouth every 6 (six) hours as needed for moderate pain.     albuterol (PROVENTIL HFA;VENTOLIN HFA) 108 (90 BASE) MCG/ACT inhaler Inhale 2 puffs into the lungs every 6 (six) hours as needed for wheezing. 1 Inhaler 11   amiodarone (PACERONE) 200 MG tablet Take 1 tablet (200 mg total) by mouth 2 (two) times daily. 60 tablet 7   carvedilol (COREG) 6.25 MG tablet Take 1 tablet (6.25 mg total) by mouth 2 (two) times daily. 60 tablet 0   carvedilol (COREG) 6.25 MG tablet Take 1 tablet (6.25 mg total) by mouth 2 (two) times daily. 180 tablet 3   empagliflozin (JARDIANCE) 25 MG TABS tablet Take 1 tablet (25 mg total) by mouth daily. Take 1/2 a tablet once daily 30 tablet 7   feeding supplement (ENSURE ENLIVE / ENSURE PLUS) LIQD Take 237 mLs by mouth 2 (two) times daily between meals. (Patient taking differently: Take 237 mLs by mouth daily.) 10000 mL 0   finasteride (PROSCAR) 5 MG tablet TAKE 1/2 TABLET BY MOUTH DAILY 30 tablet 0   fluticasone-salmeterol (WIXELA INHUB) 250-50 MCG/ACT AEPB Inhale 1 puff into the lungs in the morning and at bedtime.     Melatonin 10 MG CAPS Take 10 mg by mouth at bedtime as needed (sleep).     Multiple Vitamins-Minerals (PRESERVISION AREDS 2 PO) Take 1 tablet by mouth in the morning and at bedtime.     pantoprazole (PROTONIX) 40 MG tablet Take 1 tablet (40 mg total) by mouth daily. 30 tablet 0   PHENobarbital (LUMINAL) 64.8 MG tablet Take 1 tablet (64.8 mg total) by mouth 2 (two) times daily. 180 tablet 3   polyethylene glycol (MIRALAX / GLYCOLAX) 17 g packet Take 17 g by mouth 2 (two) times daily. (Patient taking differently: Take 17 g by mouth daily.) 60 each 1   pravastatin (PRAVACHOL) 40 MG tablet TAKE 1 TABLET BY MOUTH EVERY DAY IN THE EVENING 90 tablet 0   sacubitril-valsartan (ENTRESTO) 24-26 MG Take 1 tablet by mouth 2 (two) times  daily. For heart failure 60 tablet 8   spironolactone (ALDACTONE) 25 MG tablet Take 0.5 tablets (12.5 mg total) by mouth daily. 45 tablet 3   Tiotropium Bromide Monohydrate (SPIRIVA RESPIMAT) 2.5 MCG/ACT AERS Inhale 2 each into the lungs every evening.     torsemide (DEMADEX) 20 MG tablet Take 1 tablet (20 mg total) by mouth every Monday, Wednesday, and Friday. 15 tablet 7   Vitamin D3 (VITAMIN D) 25 MCG tablet Take 1 tablet (1,000 Units total) by mouth daily. 60 tablet 0   warfarin (COUMADIN) 3 MG tablet TAKE 1 TABLET BY MOUTH DAILY OR AS DIRECTED BY COUMADIN CLINIC 35 tablet 2   No current facility-administered medications for  this encounter.   Allergies  Allergen Reactions   Calcium Channel Blockers Other (See Comments)    Came to hospital in 1995-caused chest pain    Social History   Socioeconomic History   Marital status: Widowed    Spouse name: Not on file   Number of children: Not on file   Years of education: Not on file   Highest education level: Not on file  Occupational History   Not on file  Tobacco Use   Smoking status: Former    Types: Cigarettes    Quit date: 05/04/1986    Years since quitting: 35.9   Smokeless tobacco: Never   Tobacco comments:    Former smoker 02/12/22  Vaping Use   Vaping Use: Never used  Substance and Sexual Activity   Alcohol use: Not Currently   Drug use: No   Sexual activity: Yes  Other Topics Concern   Not on file  Social History Narrative   Not on file   Social Determinants of Health   Financial Resource Strain: Not on file  Food Insecurity: No Food Insecurity (02/01/2022)   Hunger Vital Sign    Worried About Running Out of Food in the Last Year: Never true    Ran Out of Food in the Last Year: Never true  Transportation Needs: No Transportation Needs (02/01/2022)   PRAPARE - Hydrologist (Medical): No    Lack of Transportation (Non-Medical): No  Physical Activity: Not on file  Stress: Not on file   Social Connections: Not on file  Intimate Partner Violence: Not At Risk (02/01/2022)   Humiliation, Afraid, Rape, and Kick questionnaire    Fear of Current or Ex-Partner: No    Emotionally Abused: No    Physically Abused: No    Sexually Abused: No   Family History  Problem Relation Age of Onset   Heart failure Mother 58   Heart attack Father 38   BP 138/72   Pulse 99   Resp (!) 96   Wt 102.6 kg (226 lb 3.2 oz)   BMI 35.43 kg/m   Wt Readings from Last 3 Encounters:  03/30/22 102.6 kg (226 lb 3.2 oz)  03/12/22 99.8 kg (220 lb)  03/06/22 100.2 kg (221 lb)   PHYSICAL EXAM: General:  NAD. No resp difficulty, walked into clinic with rolling walker. HEENT: Normal Neck: Supple. No JVD. Carotids 2+ bilat; no bruits. No lymphadenopathy or thryomegaly appreciated. Cor: PMI nondisplaced. Regular rate & rhythm. No rubs, gallops or murmurs. Lungs: Clear Abdomen: Obese, soft, nontender, nondistended. No hepatosplenomegaly. No bruits or masses. Good bowel sounds. Extremities: No cyanosis, clubbing, rash, edema Neuro: Alert & oriented x 3, cranial nerves grossly intact. Moves all 4 extremities w/o difficulty. Affect pleasant.  Device interrogation (personally reviewed): CorVue stable, most recent AF 03/29/22 for 1hr 21 mins, 94% BiV pacing  ASSESSMENT & PLAN: Chronic biventricular heart failure/NICM - cardiomyopathy dates back to at least 2016.  - EF variable over the years. CM previously thought to be d/t LBBB +/- recurrent AF.  - S/p CRT-P (9/21). - Multiple admissions for a/c CHF and recurrent AF this past year - Echo (2021): EF < 20% . LHC with no significant CAD. - Echo (4/23): EF 40-45% - Echo (09/26/21): EF 30-35% in setting of AF - TEE (10/23): EF 20-25%, RV severely reduced, severe BAE, moderate TR - NYHA II-early III, confounded by body habitus and deconditioning. Volume looks okay on exam and CorVue.  - Start carvedilol  6.25 mg bid. - Stop [place]. Start [place] ([place]  preference). Samples given today. Discussed GU side effects, I asked daughter to notify clinic if he has s/s UTI. - Continue torsemide 20 mg MWF - Continue Entresto 24/26 mg bid - Continue spiro 12.5 mg daily.  - Labs today.  2. Atrial fibrillation/flutter - Known history with multiple prior cardioversions - Multiple recent admits with Afib and flutter - S/p TEE/DCCV to SR in 10/23. Had recurrent AFL and had overdrive pacing in ED 27/51/70 - Most recently in ED 03/06/22. Converted to SR while in ED. - Regular rhythm on exam today.  - On amiodarone 200 mg BID per EP - Has f/u with Dr. Caryl Comes to see if he would be a candidate for high-risk PVI ablation - CHA2DS2-VASc score is 6. - On coumadin. Not on DOAC d/t interaction with phenobarbital use (see below) - INR followed by Cou[facility]in Clinic.   3. CKD 3a - Baseline SCr 1.5-1.8. - Labs today.  4. H/o brain abscess - S/p craniotomy 2015 - Has been on phenobarbital since 1995, no seizures. - He wants to stop warfarin and switch to DOAC. Has not been on DOAC due to interaction w/ phenobarb. - Discuss with PharmD, Eliquis and Xarelto are contraindicated, but technically edoxaban could be used. -  He needs to follow up with Neuro if they are OK stopping phenobarb or switching to another agent. Refer to Neurology, discussed with Dr. Haroldine Laws.  Follow up in 2 months with Dr. Haroldine Laws + echo  [place], FNP-BC 03/30/22

## 2022-03-30 NOTE — Addendum Note (Signed)
Encounter addended by: Rafael Bihari, FNP on: 03/30/2022 2:34 PM  Actions taken: Clinical Note Signed

## 2022-03-30 NOTE — Progress Notes (Signed)
Medication Samples have been provided to the patient.  Drug name: Jardiance       Strength: 10mg         Qty: 21  LOT: [id number]  Exp.Date: 11/25  Dosing instructions: Take 10 mg by mouth daily.  The patient has been instructed regarding the correct time, dose, and frequency of taking this medication, including desired effects and most common side effects.   Tiney Rouge Dorrance Sellick 10:29 AM 03/30/2022

## 2022-03-30 NOTE — Patient Instructions (Addendum)
Thank you for coming in today  Labs were done today, if any labs are abnormal the clinic will call you No news is good news  START Carvedilol 6.25 mg 1 tablet twice daily  START Jardiance 25 mg take 1/2 tablet daily  You have been referred to Neurology they will give you call about further appointment details.  Your physician recommends that you schedule a follow-up appointment in:  2-3 months with Dr. Haroldine Laws with echocardiogram  Your physician has requested that you have an echocardiogram. Echocardiography is a painless test that uses sound waves to create images of your heart. It provides your doctor with information about the size and shape of your heart and how well your heart's chambers and valves are working. This procedure takes approximately one hour. There are no restrictions for this procedure.     Do the following things EVERYDAY: Weigh yourself in the morning before breakfast. Write it down and keep it in a log. Take your medicines as prescribed Eat low salt foods--Limit salt (sodium) to 2000 mg per day.  Stay as active as you can everyday Limit all fluids for the day to less than 2 liters  At the [facility], you and your health needs are our priority. As part of our continuing mission to provide you with exceptional heart care, we have created designated Provider Care Teams. These Care Teams include your primary Cardiologist (physician) and Advanced Practice Providers (APPs- Physician Assistants and Nurse Practitioners) who all work together to provide you with the care you need, when you need it.   You may see any of the following providers on your designated Care Team at your next follow up: Dr Glori Bickers Dr Loralie Champagne Dr. Roxana Hires, NP Lyda Jester, [place] [facility] [place], [place] Forestine Na, NP Audry Riles, PharmD   Please be sure to bring in all your medications bottles to every appointment.   If you  have any questions or concerns before your next appointment please send [place] a message through [place] or call our office at [phone].    TO LEAVE A MESSAGE FOR THE NURSE SELECT OPTION 2, PLEASE LEAVE A MESSAGE INCLUDING: YOUR NAME DATE OF BIRTH CALL BACK NUMBER REASON FOR CALL**this is important as we prioritize the call backs  YOU WILL RECEIVE A CALL BACK THE SAME DAY AS LONG AS YOU CALL BEFORE 4:00 PM

## 2022-03-31 ENCOUNTER — Ambulatory Visit (INDEPENDENT_AMBULATORY_CARE_PROVIDER_SITE_OTHER): Payer: No Typology Code available for payment source | Admitting: *Deleted

## 2022-03-31 DIAGNOSIS — Z5181 Encounter for therapeutic drug level monitoring: Secondary | ICD-10-CM | POA: Diagnosis not present

## 2022-03-31 LAB — POCT INR: INR: 2.6 (ref 2.0–3.0)

## 2022-03-31 NOTE — Patient Instructions (Signed)
Description   Spoke with Claiborne Billings at [facility] & pt to continue taking 1 tablet daily. Recheck INR in 1 week. *10/5 Started Amio 200mg  BID x 7 days, then 200mg  daily*  Anticoagulation Clinic [phone]

## 2022-04-07 ENCOUNTER — Ambulatory Visit (INDEPENDENT_AMBULATORY_CARE_PROVIDER_SITE_OTHER): Payer: No Typology Code available for payment source | Admitting: *Deleted

## 2022-04-07 DIAGNOSIS — Z5181 Encounter for therapeutic drug level monitoring: Secondary | ICD-10-CM | POA: Diagnosis not present

## 2022-04-07 LAB — POCT INR: INR: 1.8 — AB (ref 2.0–3.0)

## 2022-04-07 NOTE — Patient Instructions (Addendum)
Description   Spoke with Lovey Newcomer RN at [facility] pt to take 1.5 tablets today then continue taking 1 tablet daily. Recheck INR in 1 week. *10/5 Started Amio 200mg  BID x 7 days, then 200mg  daily*  Anticoagulation Clinic [phone]

## 2022-04-10 ENCOUNTER — Encounter: Payer: Self-pay | Admitting: Internal Medicine

## 2022-04-10 ENCOUNTER — Ambulatory Visit: Payer: No Typology Code available for payment source | Attending: Internal Medicine | Admitting: Internal Medicine

## 2022-04-10 VITALS — BP 112/70 | HR 61 | Ht 67.0 in | Wt 222.6 lb

## 2022-04-10 DIAGNOSIS — I480 Paroxysmal atrial fibrillation: Secondary | ICD-10-CM

## 2022-04-10 DIAGNOSIS — I5022 Chronic systolic (congestive) heart failure: Secondary | ICD-10-CM | POA: Diagnosis not present

## 2022-04-10 DIAGNOSIS — E039 Hypothyroidism, unspecified: Secondary | ICD-10-CM

## 2022-04-10 DIAGNOSIS — I4892 Unspecified atrial flutter: Secondary | ICD-10-CM

## 2022-04-10 DIAGNOSIS — Z79899 Other long term (current) drug therapy: Secondary | ICD-10-CM

## 2022-04-10 DIAGNOSIS — I428 Other cardiomyopathies: Secondary | ICD-10-CM

## 2022-04-10 DIAGNOSIS — Z95810 Presence of automatic (implantable) cardiac defibrillator: Secondary | ICD-10-CM

## 2022-04-10 NOTE — Progress Notes (Signed)
Patient Care Team: [facility] as PCP - General Deboraha Sprang, MD as PCP - Electrophysiology (Cardiology) Leonie Man, MD as PCP - Cardiology (Cardiology) Maryellen Pile, MD (Inactive) as Resident (Internal Medicine)   HPI  Ricky Hayden is a 76 y.o. male seen in followup for CRT. St Jude implanted 9/21 I had failed an LV lead placement.  Dr. Lovena Le undertook left bundle branch block area pacing with post implant QRS duration of 106 ms.  RV output is programmed subthreshold   Cardiac consultation note 08/03/2019 describes a low ejection fraction heart failure on and off since 2016.  Denies coronary disease or bypass surgery.  * Hospitalized 4/21 for COVID pneumonia    Numerous hospitalizations secondary to prolonged infections over the last year. Complicated by atrial fibrillation with rapid rates prompting cardioversion.  Again 9/23 started on amiodarone  10/23 presented to the hospital with atrial flutter with RVR successfully paced terminated.  Amiodarone was increased  Issues were raised as to dofetilide, AV nodal ablation in the context of his pacing, or A-fib ablation.    The patient denies chest pain, nocturnal dyspnea, orthopnea.  There have been no palpitations, lightheadedness or syncope .    Intermittent atrial fibrillation/flutter been associated with CHF exacerbations.  Saw the [facility] yesterday, anticipating that we will be his primary cardiologist now  Mr.   Date Cr K Hgb TSH  9//20 (scanned) .  1.37 4.2 14.5    6/23  1.27 4.3    13.2 3.345      DATE TEST EF    10/17 Echo  30-35%    10/20 Echo   30-35 %     4/21 Echo  20% RV dysfn severe  5/21 LHC  Cors min obstruction  8/21 Echo  20-25% RV function normal   4/23 Echo  40-45%   9/23 Echo  20-25% RV function severely abnormal    ZIO  PVCs < 1%    Records and Results Reviewed   Past Medical History:  Diagnosis Date   Arthritis    osteoarthritis of left knee   Ascending aorta  dilatation (HCC)    Balanitis    recurrent   Cardiac arrhythmia    life threatening, secondary to CCB vs b- blockers   Cardiomyopathy ([place])    Chronic joint pain    Chronic systolic CHF (congestive heart failure) (HCC)    CKD (chronic kidney disease), stage III (HCC)    COPD (chronic obstructive pulmonary disease) ([place])    Coronary artery disease    Dilated aortic root (HCC)    Dyspnea    Enlarged prostate    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 Sherwood Gambler   Nocturia    Obesity    Pansinusitis    a.  complicated by brain abscess and bleeding requiring craniotomy in 1995.   PONV (postoperative nausea and vomiting)    Presence of permanent cardiac pacemaker    Sinusitis    s/p ethmoidectomy and nasal septoplasty   Vertigo    intermitantly    Past Surgical History:  Procedure Laterality Date   BIV UPGRADE N/A 01/29/2020   Procedure: BIV UPGRADE;  Surgeon: Evans Lance, MD;  Location:  [facility] INVASIVE CV LAB;  Service: Cardiovascular;  Laterality: N/A;   CARDIOVERSION N/A 09/26/2021   Procedure: CARDIOVERSION;  Surgeon: [name], MD;  Location: [facility] ENDOSCOPY;  Service: Cardiovascular;  Laterality: N/A;   CARDIOVERSION N/A 02/03/2022   Procedure: CARDIOVERSION;  Surgeon: Josue Hector, MD;  Location: [facility] ENDOSCOPY;  Service: Cardiovascular;  Laterality: N/A;   CIRCUMCISION N/A 11/20/2013   Procedure: CIRCUMCISION ADULT;  Surgeon: Claybon Jabs, MD;  Location: [facility] ORS;  Service: Urology;  Laterality: N/A;   CRANIOTOMY  1995   hematomy due to sinus infection    CYSTOSCOPY/URETEROSCOPY/HOLMIUM LASER/STENT PLACEMENT Left 12/23/2021   Procedure: LEFT URETEROSCOPY/HOLMIUM LASER LITHOTRIPSY/STENT PLACEMENT retrograde;  Surgeon: Vira Agar, MD;  Location: [facility]  ORS;  Service: Urology;  Laterality: Left;  90 MINUTES NEEDED FOR CASE   PACEMAKER IMPLANT N/A 01/17/2020   Procedure: PACEMAKER IMPLANT;  Surgeon: Deboraha Sprang, MD;  Location: [facility] CV LAB;  Service: Cardiovascular;  Laterality: N/A;   RIGHT/LEFT HEART CATH AND CORONARY ANGIOGRAPHY N/A 09/20/2019   Procedure: RIGHT/LEFT HEART CATH AND CORONARY ANGIOGRAPHY;  Surgeon: Jolaine Artist, MD;  Location: [facility] CV LAB;  Service: Cardiovascular;  Laterality: N/A;   shoulder surg rt   1995   SINUS SURGERY WITH INSTATRAK     ethmoidectomy and nasal septum repair   TEE WITHOUT CARDIOVERSION N/A 09/26/2021   Procedure: TRANSESOPHAGEAL ECHOCARDIOGRAM (TEE);  Surgeon: [name], MD;  Location: [facility];  Service: Cardiovascular;  Laterality: N/A;   TEE WITHOUT CARDIOVERSION N/A 02/03/2022   Procedure: TRANSESOPHAGEAL ECHOCARDIOGRAM (TEE);  Surgeon: Josue Hector, MD;  Location: [facility] ENDOSCOPY;  Service: Cardiovascular;  Laterality: N/A;    Current Meds  Medication Sig   acetaminophen (TYLENOL) 500 MG tablet Take 1,000 mg by mouth every 6 (six) hours as needed for moderate pain.   albuterol (PROVENTIL HFA;VENTOLIN HFA) 108 (90 BASE) MCG/ACT inhaler Inhale 2 puffs into the lungs every 6 (six) hours as needed for wheezing.   amiodarone (PACERONE) 200 MG tablet Take 1 tablet (200 mg total) by mouth 2 (two) times daily.   carvedilol (COREG) 6.25 MG tablet Take 1 tablet (6.25 mg total) by mouth 2 (two) times daily.   empagliflozin (JARDIANCE) 25 MG TABS tablet Take 1 tablet (25 mg total) by mouth daily. Take 1/2 a tablet once daily   feeding supplement (ENSURE ENLIVE / ENSURE PLUS) LIQD Take 237 mLs by mouth 2 (two) times daily between meals. (Patient taking differently: Take 237 mLs by mouth daily.)   finasteride (PROSCAR) 5 MG tablet TAKE 1/2 TABLET BY MOUTH DAILY   fluticasone-salmeterol (WIXELA INHUB) 250-50 MCG/ACT AEPB Inhale 1 puff into the lungs in the morning and at bedtime.    Melatonin 10 MG CAPS Take 10 mg by mouth at bedtime as needed (sleep).   Multiple Vitamins-Minerals (PRESERVISION AREDS 2 PO) Take 1 tablet by mouth in the morning and at bedtime.   pantoprazole (PROTONIX) 40 MG tablet Take 1 tablet (40 mg total) by mouth daily.   PHENobarbital (LUMINAL) 64.8 MG tablet Take 1 tablet (64.8 mg total) by mouth 2 (two) times daily.   polyethylene glycol (MIRALAX / GLYCOLAX) 17 g packet Take 17 g by mouth 2 (two) times daily. (Patient taking differently: Take 17 g by mouth daily.)   pravastatin (PRAVACHOL) 40 MG tablet TAKE 1 TABLET BY MOUTH EVERY DAY IN THE EVENING   sacubitril-valsartan (ENTRESTO) 24-26 MG Take 1 tablet by mouth 2 (two) times daily. For heart failure   spironolactone (ALDACTONE) 25  MG tablet Take 0.5 tablets (12.5 mg total) by mouth daily.   Tiotropium Bromide Monohydrate (SPIRIVA RESPIMAT) 2.5 MCG/ACT AERS Inhale 2 each into the lungs every evening.   torsemide (DEMADEX) 20 MG tablet Take 1 tablet (20 mg total) by mouth every Monday, Wednesday, and Friday.   Vitamin D3 (VITAMIN D) 25 MCG tablet Take 1 tablet (1,000 Units total) by mouth daily.   warfarin (COUMADIN) 3 MG tablet TAKE 1 TABLET BY MOUTH DAILY OR AS DIRECTED BY COUMADIN CLINIC    Allergies  Allergen Reactions   Calcium Channel Blockers Other (See Comments)    Came to hospital in 1995-caused chest pain       Review of Systems negative except from HPI and PMH  Physical Exam BP 112/70   Pulse 61   Ht 5\' 7"  (1.702 m)   Wt 222 lb 9.6 oz (101 kg)   SpO2 96%   BMI 34.86 kg/m  Well developed and nourished in no acute distress HENT normal Neck supple  Clear Device pocket well healed; without hematoma or erythema.  There is no tethering  Regular rate and rhythm, no murmurs or gallops Abd-soft with active BS No Clubbing cyanosis edema Skin-warm and dry A & Oriented  Grossly normal sensory and motor function  ECG sinus with P synchronous pacing at 61 Level  25/10/47  Assessment and  Plan  Nonischemic cardiomyopathy  Atrial fibrillation/flutter persistent with rapid ventricular response triggering heart failure  Left bundle branch block/first-degree AV block   Congestive heart failure-chronic-systolic class III  COPD-oxygen dependent  RV function/dysfunction-variable echoes  CRTP-Saint Jude  The patient has recurrent atrial arrhythmias with rapid ventricular response.  Was on and off and then resumed back on amiodarone 9/23 and there is been a significant reduction in his atrial fibrillation burden since then.  We discussed alternatives including continuing amiodarone, AV junction ablation.  I do not think he would be a great candidate for PVI given his RV dysfunction and his COPD.  And in the context of his incredibly narrow QRS accomplished by Dr. Elliot Cousin with left bundle branch area pacing would expect a very good outcome with AV junction ablation.  Have reviewed this.  He is agreeable to this.  Will check his TSH on his amiodarone.

## 2022-04-10 NOTE — Patient Instructions (Signed)
Medication Instructions:  Your physician recommends that you continue on your current medications as directed. Please refer to the Current Medication list given to you today.  *If you need a refill on your cardiac medications before your next appointment, please call your pharmacy*   Lab Work: TSH today If you have labs (blood work) drawn today and your tests are completely normal, you will receive your results only by: [place] (if you have MyChart) OR A paper copy in the mail If you have any lab test that is abnormal or we need to change your treatment, we will call you to review the results.   Testing/Procedures: None ordered.    Follow-Up: At [facility], you and your health needs are our priority.  As part of our continuing mission to provide you with exceptional heart care, we have created designated Provider Care Teams.  These Care Teams include your primary Cardiologist (physician) and Advanced Practice Providers (APPs -  Physician Assistants and Nurse Practitioners) who all work together to provide you with the care you need, when you need it.  We recommend signing up for the patient portal called "MyChart".  Sign up information is provided on this After Visit Summary.  MyChart is used to connect with patients for Virtual Visits (Telemedicine).  Patients are able to view lab/test results, encounter notes, upcoming appointments, etc.  Non-urgent messages can be sent to your provider as well.   To learn more about what you can do with MyChart, go to [email or web address].    Your next appointment:   3 months with Dr Caryl Comes  Important Information About [place]

## 2022-04-11 LAB — TSH: TSH: 3.24 u[IU]/mL (ref 0.450–4.500)

## 2022-04-14 ENCOUNTER — Ambulatory Visit (INDEPENDENT_AMBULATORY_CARE_PROVIDER_SITE_OTHER): Payer: No Typology Code available for payment source | Admitting: *Deleted

## 2022-04-14 DIAGNOSIS — I4891 Unspecified atrial fibrillation: Secondary | ICD-10-CM

## 2022-04-14 DIAGNOSIS — Z5181 Encounter for therapeutic drug level monitoring: Secondary | ICD-10-CM | POA: Diagnosis not present

## 2022-04-14 LAB — POCT INR: INR: 2 (ref 2.0–3.0)

## 2022-04-14 NOTE — Patient Instructions (Signed)
Description   Spoke with [facility] LPN at [place] pt to take 1.5 tablets today then continue taking 1 tablet daily. Recheck INR in 1 week. *10/5 Started Amio 200mg  BID x 7 days, then 200mg  daily*  Anticoagulation Clinic [phone]

## 2022-04-21 ENCOUNTER — Other Ambulatory Visit (HOSPITAL_COMMUNITY): Payer: Self-pay | Admitting: Family Medicine

## 2022-04-21 ENCOUNTER — Ambulatory Visit (INDEPENDENT_AMBULATORY_CARE_PROVIDER_SITE_OTHER): Payer: No Typology Code available for payment source | Admitting: *Deleted

## 2022-04-21 DIAGNOSIS — Z5181 Encounter for therapeutic drug level monitoring: Secondary | ICD-10-CM | POA: Diagnosis not present

## 2022-04-21 DIAGNOSIS — I4891 Unspecified atrial fibrillation: Secondary | ICD-10-CM

## 2022-04-21 LAB — POCT INR: INR: 1.5 — AB (ref 2.0–3.0)

## 2022-04-28 ENCOUNTER — Ambulatory Visit (INDEPENDENT_AMBULATORY_CARE_PROVIDER_SITE_OTHER): Payer: No Typology Code available for payment source

## 2022-04-28 DIAGNOSIS — Z5181 Encounter for therapeutic drug level monitoring: Secondary | ICD-10-CM

## 2022-04-28 LAB — POCT INR: INR: 2.4 (ref 2.0–3.0)

## 2022-04-28 NOTE — Patient Instructions (Signed)
Description   Spoke with Carlyon Prows, RN at [place] pt. Instructed for pt to continue taking 1 tablet daily except 1.5 tablets on Sunday.  Recheck INR in 1 week. *10/5 Started Amio 200mg  BID x 7 days, then 200mg  daily*  Anticoagulation Clinic [phone]

## 2022-04-30 ENCOUNTER — Encounter: Payer: Self-pay | Admitting: Internal Medicine

## 2022-05-05 ENCOUNTER — Ambulatory Visit (INDEPENDENT_AMBULATORY_CARE_PROVIDER_SITE_OTHER): Payer: No Typology Code available for payment source | Admitting: Internal Medicine

## 2022-05-05 DIAGNOSIS — Z5181 Encounter for therapeutic drug level monitoring: Secondary | ICD-10-CM

## 2022-05-05 LAB — POCT INR: INR: 2.4 (ref 2.0–3.0)

## 2022-05-08 ENCOUNTER — Ambulatory Visit: Payer: No Typology Code available for payment source | Admitting: Neurology

## 2022-05-08 ENCOUNTER — Encounter: Payer: Self-pay | Admitting: Neurology

## 2022-05-08 DIAGNOSIS — Z029 Encounter for administrative examinations, unspecified: Secondary | ICD-10-CM

## 2022-05-12 ENCOUNTER — Telehealth: Payer: Self-pay | Admitting: *Deleted

## 2022-05-12 ENCOUNTER — Ambulatory Visit (HOSPITAL_COMMUNITY)
Admission: RE | Admit: 2022-05-12 | Discharge: 2022-05-12 | Disposition: A | Discharge status: Home / Self Care | Payer: No Typology Code available for payment source | Source: Ambulatory Visit | Attending: Family Medicine | Admitting: Family Medicine

## 2022-05-12 DIAGNOSIS — Z87891 Personal history of nicotine dependence: Secondary | ICD-10-CM | POA: Diagnosis not present

## 2022-05-12 DIAGNOSIS — I493 Ventricular premature depolarization: Secondary | ICD-10-CM | POA: Insufficient documentation

## 2022-05-12 DIAGNOSIS — N183 Chronic kidney disease, stage 3 unspecified: Secondary | ICD-10-CM | POA: Insufficient documentation

## 2022-05-12 DIAGNOSIS — E785 Hyperlipidemia, unspecified: Secondary | ICD-10-CM | POA: Insufficient documentation

## 2022-05-12 DIAGNOSIS — I428 Other cardiomyopathies: Secondary | ICD-10-CM | POA: Diagnosis not present

## 2022-05-12 DIAGNOSIS — I7121 Aneurysm of the ascending aorta, without rupture: Secondary | ICD-10-CM | POA: Insufficient documentation

## 2022-05-12 DIAGNOSIS — J449 Chronic obstructive pulmonary disease, unspecified: Secondary | ICD-10-CM | POA: Insufficient documentation

## 2022-05-12 DIAGNOSIS — I5022 Chronic systolic (congestive) heart failure: Secondary | ICD-10-CM | POA: Diagnosis not present

## 2022-05-12 DIAGNOSIS — I110 Hypertensive heart disease with heart failure: Secondary | ICD-10-CM | POA: Diagnosis not present

## 2022-05-12 LAB — ECHOCARDIOGRAM COMPLETE
AR max vel: 2.94 cm2
AV Area VTI: 3.16 cm2
AV Area mean vel: 2.95 cm2
AV Mean grad: 3 mmHg
AV Peak grad: 5.2 mmHg
Ao pk vel: 1.14 m/s
S' Lateral: 4.4 cm

## 2022-05-12 MED ORDER — PERFLUTREN LIPID MICROSPHERE
1.0000 mL | INTRAVENOUS | Status: AC | PRN
  Administered 2022-05-12: 2 mL via INTRAVENOUS

## 2022-05-12 NOTE — Telephone Encounter (Signed)
Received a fax from [facility] for a PT/INR check on tomorrow, 05/13/22, but our last Anticoagulation Order was to have INR done on 05/19/2022.  Called [place] RN [phone]) and left her a message that pt is due to have INR done on 05/19/22 and left a message for the pt as well. Also, called the Main office [phone]) and spoke with Kathlee Nations nurse and advised and she will deactivate order for tomorrow per the verbal phone order on 05/05/2022.  Lovey Newcomer called back and she verbalized understanding and will check the pt next week per original order.

## 2022-05-12 NOTE — Progress Notes (Signed)
  Echocardiogram 2D Echocardiogram has been performed.  Ricky Hayden 05/12/2022, 12:02 PM

## 2022-05-14 ENCOUNTER — Encounter (HOSPITAL_COMMUNITY): Payer: Self-pay

## 2022-05-19 ENCOUNTER — Telehealth: Payer: Self-pay

## 2022-05-19 ENCOUNTER — Ambulatory Visit (INDEPENDENT_AMBULATORY_CARE_PROVIDER_SITE_OTHER): Payer: No Typology Code available for payment source | Admitting: Cardiology

## 2022-05-19 DIAGNOSIS — Z5181 Encounter for therapeutic drug level monitoring: Secondary | ICD-10-CM | POA: Diagnosis not present

## 2022-05-19 LAB — POCT INR: INR: 2.4 (ref 2.0–3.0)

## 2022-05-19 NOTE — Patient Instructions (Signed)
Spoke with Lovey Newcomer, RN at [place] pt. Instructed for pt to continue taking 1 tablet daily except 1.5 tablets on Sunday.  Recheck INR in 4 weeks. *10/5 Started Amio 200mg  BID x 7 days, then 200mg  daily*  Anticoagulation Clinic [phone]

## 2022-05-19 NOTE — Telephone Encounter (Signed)
done

## 2022-06-04 ENCOUNTER — Encounter (HOSPITAL_COMMUNITY): Payer: Self-pay | Admitting: Internal Medicine

## 2022-06-04 ENCOUNTER — Ambulatory Visit (HOSPITAL_COMMUNITY)
Admission: RE | Admit: 2022-06-04 | Discharge: 2022-06-04 | Disposition: A | Discharge status: Home / Self Care | Payer: No Typology Code available for payment source | Source: Ambulatory Visit | Attending: Internal Medicine | Admitting: Internal Medicine

## 2022-06-04 ENCOUNTER — Other Ambulatory Visit (HOSPITAL_COMMUNITY): Payer: Self-pay

## 2022-06-04 VITALS — BP 122/78 | HR 60 | Wt 235.2 lb

## 2022-06-04 DIAGNOSIS — J440 Chronic obstructive pulmonary disease with acute lower respiratory infection: Secondary | ICD-10-CM | POA: Insufficient documentation

## 2022-06-04 DIAGNOSIS — I5082 Biventricular heart failure: Secondary | ICD-10-CM | POA: Diagnosis not present

## 2022-06-04 DIAGNOSIS — N1831 Chronic kidney disease, stage 3a: Secondary | ICD-10-CM | POA: Diagnosis not present

## 2022-06-04 DIAGNOSIS — I5042 Chronic combined systolic (congestive) and diastolic (congestive) heart failure: Secondary | ICD-10-CM

## 2022-06-04 DIAGNOSIS — I480 Paroxysmal atrial fibrillation: Secondary | ICD-10-CM

## 2022-06-04 DIAGNOSIS — Z9889 Other specified postprocedural states: Secondary | ICD-10-CM | POA: Diagnosis not present

## 2022-06-04 DIAGNOSIS — Z79899 Other long term (current) drug therapy: Secondary | ICD-10-CM | POA: Insufficient documentation

## 2022-06-04 DIAGNOSIS — I4892 Unspecified atrial flutter: Secondary | ICD-10-CM | POA: Diagnosis not present

## 2022-06-04 DIAGNOSIS — Z8661 Personal history of infections of the central nervous system: Secondary | ICD-10-CM | POA: Insufficient documentation

## 2022-06-04 DIAGNOSIS — N390 Urinary tract infection, site not specified: Secondary | ICD-10-CM | POA: Diagnosis not present

## 2022-06-04 DIAGNOSIS — Z7984 Long term (current) use of oral hypoglycemic drugs: Secondary | ICD-10-CM | POA: Diagnosis not present

## 2022-06-04 DIAGNOSIS — E1122 Type 2 diabetes mellitus with diabetic chronic kidney disease: Secondary | ICD-10-CM | POA: Diagnosis not present

## 2022-06-04 DIAGNOSIS — I4891 Unspecified atrial fibrillation: Secondary | ICD-10-CM | POA: Insufficient documentation

## 2022-06-04 DIAGNOSIS — Z8679 Personal history of other diseases of the circulatory system: Secondary | ICD-10-CM | POA: Diagnosis not present

## 2022-06-04 DIAGNOSIS — Z7901 Long term (current) use of anticoagulants: Secondary | ICD-10-CM | POA: Diagnosis not present

## 2022-06-04 DIAGNOSIS — E785 Hyperlipidemia, unspecified: Secondary | ICD-10-CM | POA: Insufficient documentation

## 2022-06-04 DIAGNOSIS — I428 Other cardiomyopathies: Secondary | ICD-10-CM | POA: Diagnosis not present

## 2022-06-04 DIAGNOSIS — I130 Hypertensive heart and chronic kidney disease with heart failure and stage 1 through stage 4 chronic kidney disease, or unspecified chronic kidney disease: Secondary | ICD-10-CM | POA: Insufficient documentation

## 2022-06-04 DIAGNOSIS — R0600 Dyspnea, unspecified: Secondary | ICD-10-CM | POA: Insufficient documentation

## 2022-06-04 DIAGNOSIS — N200 Calculus of kidney: Secondary | ICD-10-CM

## 2022-06-04 DIAGNOSIS — Z8616 Personal history of COVID-19: Secondary | ICD-10-CM | POA: Diagnosis not present

## 2022-06-04 DIAGNOSIS — I447 Left bundle-branch block, unspecified: Secondary | ICD-10-CM | POA: Diagnosis not present

## 2022-06-04 DIAGNOSIS — N183 Chronic kidney disease, stage 3 unspecified: Secondary | ICD-10-CM

## 2022-06-04 LAB — COMPREHENSIVE METABOLIC PANEL
ALT: 17 U/L (ref 0–44)
AST: 19 U/L (ref 15–41)
Albumin: 3.7 g/dL (ref 3.5–5.0)
Alkaline Phosphatase: 58 U/L (ref 38–126)
Anion gap: 2 — ABNORMAL LOW (ref 5–15)
BUN: 30 mg/dL — ABNORMAL HIGH (ref 8–23)
CO2: 28 mmol/L (ref 22–32)
Calcium: 8.8 mg/dL — ABNORMAL LOW (ref 8.9–10.3)
Chloride: 107 mmol/L (ref 98–111)
Creatinine, Ser: 1.78 mg/dL — ABNORMAL HIGH (ref 0.61–1.24)
GFR, Estimated: 39 mL/min — ABNORMAL LOW (ref >60)
Glucose, Bld: 81 mg/dL (ref 70–99)
Potassium: 5.1 mmol/L (ref 3.5–5.1)
Sodium: 137 mmol/L (ref 135–145)
Total Bilirubin: 0.3 mg/dL (ref 0.3–1.2)
Total Protein: 6.8 g/dL (ref 6.5–8.1)

## 2022-06-04 LAB — BRAIN NATRIURETIC PEPTIDE: B Natriuretic Peptide: 78.8 pg/mL (ref 0.0–100.0)

## 2022-06-04 MED ORDER — EMPAGLIFLOZIN 25 MG PO TABS: 25.0000 mg | ORAL_TABLET | Freq: Every day | ORAL | 11 refills | Status: DC

## 2022-06-04 NOTE — Progress Notes (Signed)
ADVANCED HF CLINIC NOTE   Primary Care: Clinic, [facility] EP: Dr. Caryl Comes HF Cardiologist: Dr. Haroldine Laws  HPI: Ricky Hayden is a 77 y.o. 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    EF 45-50% in 2016, 30-35% in 2017 and 2020.   CM previously felt to be d/t combination of LBBB and recurrent AF.   Admitted 4/10 to 08/09/19 with COVID PNA and HF. Echo repeated and EF back down to 20% w/ severe RV dysfunction. He recovered from [place] but unfortunately, his wife died from [place].    Underwent CRT-P in September 2021.   He was admitted to [facility] in 12/22 with a/c CHF and atrial flutter. Underwent successful DCCV. Readmitted in 04/23 with acute respiratory failure, urosepsis and proteus bacteremia. Echo during admit with EF 40-45%. Course further c/b brief PEA arrest, aspiration PNA/HCAP, and atrial flutter with RVR. Underwent successful DCCV. EF 30-35% on TEE.    Admitted again in June 2023 with pyelonephritis and Jardiance stopped. Had afib with RVR but converted to SR spontaneously.    Admitted 9/23 with acute hypoxic respiratory failure secondary to a/c CHF and AF with RVR. Started on IV amiodarone and IV lasix.  Underwent TEE guided DCCV to SR. TEE demonstrated EF 20-25%, RV severely reduced, moderate TR.  Transient hypotension post TEE requiring pressors. Drips weaned and GDMT restarted.  Discharged home, weight 223 lbs.  Seen for hospital f/u 02/12/22. Low-dose Entresto restarted.   Seen in ED 02/24/22 with atrial flutter with RVR. EP was consulted. Successfully converted to SR with overdriving pacing. Amiodarone increased to 200 mg BID.  Note felt to be good candidate for PVI ablation. Planning to see Dr. Caryl Comes next month to see if potential candidate for high risk PVI ablation.  Returned to ED 03/06/22 with recurrent  atrial flutter. Cardioversion planned but converted to SR while in ED.  Follow up 11/23, volume stable with NYHA II-III and volume stable. Confusion over medications, but he declined paramedicine.   Today he returns for HF follow up with his daughter.-in-law. Feels good. Denies CP. Swelling well controlled. Gets around with walker. Mild DOE and fatigue with longer distances. Jardiance off since 4/23 due to UTI. Says BP 110-125  Echo 05/12/22 EF 30-35% Personally reviewed  Cardiac Studies  - TEE (10/23): EF 20-25%, RV severely reduced, moderate TR, no LAA  - Echo (4/23): EF 40-45%, LV mildly decreased, grade I DD, RV ok  - Echo (4/21): EF 20%, severe RV dysfunction (had COVID PNA)  - Echo (2020): EF 30-35%  - Echo (2017): EF 30-35%  - Echo (2016): EF 45-50%  Past Medical History:  Diagnosis Date   Arthritis    osteoarthritis of left knee   Ascending aorta dilatation (HCC)    Balanitis    recurrent   Cardiac arrhythmia    life threatening, secondary to CCB vs b- blockers   Cardiomyopathy ([place])    Chronic joint pain    Chronic systolic CHF (congestive heart failure) (HCC)    CKD (chronic kidney disease), stage III (HCC)    COPD (chronic obstructive pulmonary disease) ([place])    Coronary artery disease    Dilated aortic root (HCC)    Dyspnea    Enlarged prostate    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 Sherwood Gambler   Nocturia    Obesity    Pansinusitis    a.  complicated by brain abscess and bleeding requiring craniotomy in 1995.   PONV (postoperative nausea and vomiting)    Presence of permanent cardiac pacemaker    Sinusitis    s/p ethmoidectomy and nasal septoplasty   Vertigo    intermitantly    Current Outpatient Medications  Medication Sig  Dispense Refill   acetaminophen (TYLENOL) 500 MG tablet Take 1,000 mg by mouth every 6 (six) hours as needed for moderate pain.     albuterol (PROVENTIL HFA;VENTOLIN HFA) 108 (90 BASE) MCG/ACT inhaler Inhale 2 puffs into the lungs every 6 (six) hours as needed for wheezing. 1 Inhaler 11   amiodarone (PACERONE) 200 MG tablet Take 1 tablet (200 mg total) by mouth 2 (two) times daily. 60 tablet 7   carvedilol (COREG) 6.25 MG tablet TAKE 1 TABLET BY MOUTH TWICE A DAY 180 tablet 1   feeding supplement (ENSURE ENLIVE / ENSURE PLUS) LIQD Take 237 mLs by mouth 2 (two) times daily between meals. 10000 mL 0   finasteride (PROSCAR) 5 MG tablet TAKE 1/2 TABLET BY MOUTH DAILY 30 tablet 0   fluticasone-salmeterol (WIXELA INHUB) 250-50 MCG/ACT AEPB Inhale 1 puff into the lungs in the morning and at bedtime.     Melatonin 10 MG CAPS Take 10 mg by mouth at bedtime as needed (sleep).     Multiple Vitamins-Minerals (PRESERVISION AREDS 2 PO) Take 1 tablet by mouth in the morning and at bedtime.     pantoprazole (PROTONIX) 40 MG tablet Take 1 tablet (40 mg total) by mouth daily. 30 tablet 0   PHENobarbital (LUMINAL) 64.8 MG tablet Take 1 tablet (64.8 mg total) by mouth 2 (two) times daily. 180 tablet 3   polyethylene glycol (MIRALAX / GLYCOLAX) 17 g packet Take 17 g by mouth 2 (two) times daily. (Patient taking differently: Take 17 g by mouth daily.) 60 each 1   pravastatin (PRAVACHOL) 40 MG tablet TAKE 1 TABLET BY MOUTH EVERY DAY IN THE EVENING 90 tablet 0   sacubitril-valsartan (ENTRESTO) 24-26 MG Take 1 tablet by mouth 2 (two) times daily. For heart failure 60 tablet 8   spironolactone (ALDACTONE) 25 MG tablet Take 0.5 tablets (12.5 mg total) by mouth daily. 45 tablet 3   Tiotropium Bromide Monohydrate (SPIRIVA RESPIMAT) 2.5 MCG/ACT AERS Inhale 2 each into the lungs every evening.     torsemide (DEMADEX) 20 MG tablet Take 1 tablet (20 mg total) by mouth every Monday, Wednesday, and Friday. 15 tablet 7   Vitamin D3  (VITAMIN D) 25 MCG tablet Take 1 tablet (1,000 Units total) by mouth daily. 60 tablet 0   warfarin (COUMADIN) 3 MG tablet TAKE 1 TABLET BY MOUTH DAILY OR AS DIRECTED BY COUMADIN CLINIC 35 tablet 2   empagliflozin (JARDIANCE) 25 MG TABS tablet Take 1 tablet (25 mg total) by mouth daily. Take 1/2 a tablet once daily (Patient not taking: Reported on 06/04/2022) 30 tablet 7   No current facility-administered medications for this encounter.   Allergies  Allergen Reactions   Calcium Channel Blockers Other (See Comments)    Came to hospital in 1995-caused chest pain    Social History   Socioeconomic History   Marital status: Widowed    Spouse name:  Not on file   Number of children: Not on file   Years of education: Not on file   Highest education level: Not on file  Occupational History   Not on file  Tobacco Use   Smoking status: Former    Types: Cigarettes    Quit date: 05/04/1986    Years since quitting: 36.1   Smokeless tobacco: Never   Tobacco comments:    Former smoker 02/12/22  Vaping Use   Vaping Use: Never used  Substance and Sexual Activity   Alcohol use: Not Currently   Drug use: No   Sexual activity: Yes  Other Topics Concern   Not on file  Social History Narrative   Not on file   Social Determinants of Health   Financial Resource Strain: Not on file  Food Insecurity: No Food Insecurity (02/01/2022)   Hunger Vital Sign    Worried About Running Out of Food in the Last Year: Never true    Ran Out of Food in the Last Year: Never true  Transportation Needs: No Transportation Needs (02/01/2022)   PRAPARE - Hydrologist (Medical): No    Lack of Transportation (Non-Medical): No  Physical Activity: Not on file  Stress: Not on file  Social Connections: Not on file  Intimate Partner Violence: Not At Risk (02/01/2022)   Humiliation, Afraid, Rape, and Kick questionnaire    Fear of Current or Ex-Partner: No    Emotionally Abused: No     Physically Abused: No    Sexually Abused: No   Family History  Problem Relation Age of Onset   Heart failure Mother 27   Heart attack Father 12   BP 122/78   Pulse 60   Wt 106.7 kg (235 lb 3.2 oz)   SpO2 98%   BMI 36.84 kg/m   Wt Readings from Last 3 Encounters:  06/04/22 106.7 kg (235 lb 3.2 oz)  04/10/22 101 kg (222 lb 9.6 oz)  03/30/22 102.6 kg (226 lb 3.2 oz)   PHYSICAL EXAM: General:  NAD. No resp difficulty, walked into clinic with rolling walker. HEENT: normal Neck: supple. no JVD. Carotids 2+ bilat; no bruits. No lymphadenopathy or thryomegaly appreciated. Cor: PMI nondisplaced. Regular rate & rhythm. No rubs, gallops or murmurs. Lungs: clear Abdomen: obese soft, nontender, nondistended. No hepatosplenomegaly. No bruits or masses. Good bowel sounds. Extremities: no cyanosis, clubbing, rash, edema Neuro: alert & orientedx3, cranial nerves grossly intact. moves all 4 extremities w/o difficulty. Affect pleasant  Device interrogation (personally reviewed): Volume looks good. No VT/AF 97% BPacing Personally reviewed  ASSESSMENT & PLAN:  Chronic biventricular heart failure/NICM - cardiomyopathy dates back to at least 2016.  - EF variable over the years. CM previously thought to be d/t LBBB +/- recurrent AF.  - S/p CRT-P (9/21). - Multiple admissions for a/c CHF and recurrent AF this past year - Echo (2021): EF < 20% . LHC with no significant CAD. - Echo (4/23): EF 40-45% - Echo (09/26/21): EF 30-35% in setting of AF - TEE (10/23): EF 20-25%, RV severely reduced, severe BAE, moderate TR - Stable NYHA II-III, confounded by body habitus and deconditioning. Volume looks okay on exam and CorVue.  - Continue carvedilol 6.25 mg bid. - Restart Jardiance. Watch for recurrent UTI - Continue torsemide 20 mg MWF - Continue Entresto 24/26 mg bid - Continue spiro 12.5 mg daily.  - Labs today.  2. Atrial fibrillation/flutter - Known history with multiple prior cardioversions -  Multiple previous  admits with Afib and flutter - S/p TEE/DCCV to SR in 10/23. Had recurrent AFL and had overdrive pacing in ED 25/08/71 - Most recently in ED 03/06/22. Converted to SR while in ED. - Remains in NSR today - On amiodarone 200 mg BID per EP (saw Dr. Caryl Comes 12/23) - not felt to be candidate for PVI ablation  - CHA2DS2-VASc score is 6. - On coumadin. Not on DOAC d/t interaction with phenobarbital use (see below) - INR followed by Coumadin Clinic   3. CKD 3a - Baseline SCr 1.5-1.8. - Labs today.  4. H/o brain abscess - S/p craniotomy 2015 - Has been on phenobarbital since 1995, no seizures.   Glori Bickers, MD  2:47 PM

## 2022-06-04 NOTE — Patient Instructions (Signed)
[place] done today, your results will be available in MyChart, we will contact you for abnormal readings.  Your physician recommends that you schedule a follow-up appointment in: 6 months (August 2024) ** please call the office in May to arrange your follow up appointment **  If you have any questions or concerns before your next appointment please send [place] a message through [place] or call our office at [phone].    TO LEAVE A MESSAGE FOR THE NURSE SELECT OPTION 2, PLEASE LEAVE A MESSAGE INCLUDING: YOUR NAME DATE OF BIRTH CALL BACK NUMBER REASON FOR CALL**this is important as we prioritize the call backs  YOU WILL RECEIVE A CALL BACK THE SAME DAY AS LONG AS YOU CALL BEFORE 4:00 PM  At the [facility], you and your health needs are our priority. As part of our continuing mission to provide you with exceptional heart care, we have created designated Provider Care Teams. These Care Teams include your primary Cardiologist (physician) and Advanced Practice Providers (APPs- Physician Assistants and Nurse Practitioners) who all work together to provide you with the care you need, when you need it.   You may see any of the following providers on your designated Care Team at your next follow up: Dr Glori Bickers Dr Loralie Champagne Dr. Roxana Hires, NP Lyda Jester, [place] [facility] [place], [place] Forestine Na, NP Audry Riles, PharmD   Please be sure to bring in all your medications bottles to every appointment.    Thank you for choosing [facility]

## 2022-06-05 ENCOUNTER — Telehealth (HOSPITAL_COMMUNITY): Payer: Self-pay | Admitting: Cardiology

## 2022-06-05 NOTE — Telephone Encounter (Signed)
First handicap application completed incorrectly-6 mths New form for permanent disability-5 yrs place card completed and signed by provider Left in the front office  Detailed message left for son

## 2022-06-16 ENCOUNTER — Ambulatory Visit: Payer: No Typology Code available for payment source | Attending: Cardiology | Admitting: *Deleted

## 2022-06-16 DIAGNOSIS — Z5181 Encounter for therapeutic drug level monitoring: Secondary | ICD-10-CM | POA: Diagnosis not present

## 2022-06-16 DIAGNOSIS — I4891 Unspecified atrial fibrillation: Secondary | ICD-10-CM | POA: Diagnosis not present

## 2022-06-16 LAB — POCT INR: INR: 2 (ref 2.0–3.0)

## 2022-06-16 NOTE — Patient Instructions (Signed)
Description   Today take 1.5 tablets then continue taking 1 tablet daily except 1.5 tablets on Sunday.  Recheck INR in 4 weeks. *10/5 Started Amio 276m BID x 7 days, then 2044mdaily*  Anticoagulation Clinic [phone]

## 2022-06-29 ENCOUNTER — Other Ambulatory Visit: Payer: Self-pay | Admitting: *Deleted

## 2022-06-29 DIAGNOSIS — I480 Paroxysmal atrial fibrillation: Secondary | ICD-10-CM

## 2022-06-29 MED ORDER — WARFARIN SODIUM 3 MG PO TABS: ORAL_TABLET | ORAL | 2 refills | Status: DC

## 2022-06-29 NOTE — Telephone Encounter (Signed)
Prescription refill request received for warfarin Lov: 06/04/22 ([facility])  Next INR check: 07/14/22 Warfarin tablet strength: '3mg'$   Appropriate dose. Refill sent.

## 2022-07-13 ENCOUNTER — Ambulatory Visit: Payer: No Typology Code available for payment source | Attending: Internal Medicine | Admitting: Internal Medicine

## 2022-07-13 VITALS — BP 128/76 | Ht 67.0 in | Wt 241.2 lb

## 2022-07-13 DIAGNOSIS — Z950 Presence of cardiac pacemaker: Secondary | ICD-10-CM

## 2022-07-13 DIAGNOSIS — I5022 Chronic systolic (congestive) heart failure: Secondary | ICD-10-CM

## 2022-07-13 DIAGNOSIS — I447 Left bundle-branch block, unspecified: Secondary | ICD-10-CM | POA: Diagnosis not present

## 2022-07-13 DIAGNOSIS — Z79899 Other long term (current) drug therapy: Secondary | ICD-10-CM

## 2022-07-13 DIAGNOSIS — I4819 Other persistent atrial fibrillation: Secondary | ICD-10-CM

## 2022-07-13 DIAGNOSIS — I428 Other cardiomyopathies: Secondary | ICD-10-CM | POA: Diagnosis not present

## 2022-07-13 LAB — BASIC METABOLIC PANEL
BUN/Creatinine Ratio: 16 (ref 10–24)
BUN: 26 mg/dL (ref 8–27)
CO2: 28 mmol/L (ref 20–29)
Calcium: 9.8 mg/dL (ref 8.6–10.2)
Chloride: 106 mmol/L (ref 96–106)
Creatinine, Ser: 1.66 mg/dL — ABNORMAL HIGH (ref 0.76–1.27)
Glucose: 84 mg/dL (ref 70–99)
Potassium: 5.3 mmol/L — ABNORMAL HIGH (ref 3.5–5.2)
Sodium: 143 mmol/L (ref 134–144)
eGFR: 42 mL/min/{1.73_m2} — ABNORMAL LOW (ref >59)

## 2022-07-13 LAB — CUP PACEART INCLINIC DEVICE CHECK
Battery Remaining Longevity: 82 mo
Battery Voltage: 3.01 V
Brady Statistic RA Percent Paced: 33 %
Brady Statistic RV Percent Paced: 98 %
Date Time Interrogation Session: 20240311203352
Implantable Lead Connection Status: 753985
Implantable Lead Connection Status: 753985
Implantable Lead Connection Status: 753985
Implantable Lead Implant Date: 20210915
Implantable Lead Implant Date: 20210915
Implantable Lead Implant Date: 20210927
Implantable Lead Location: 753858
Implantable Lead Location: 753859
Implantable Lead Location: 753860
Implantable Lead Model: 3830
Implantable Pulse Generator Implant Date: 20210915
Lead Channel Impedance Value: 437.5 Ohm
Lead Channel Impedance Value: 462.5 Ohm
Lead Channel Impedance Value: 587.5 Ohm
Lead Channel Pacing Threshold Amplitude: 0.5 V
Lead Channel Pacing Threshold Amplitude: 0.5 V
Lead Channel Pacing Threshold Amplitude: 0.75 V
Lead Channel Pacing Threshold Amplitude: 0.75 V
Lead Channel Pacing Threshold Amplitude: 0.75 V
Lead Channel Pacing Threshold Amplitude: 0.75 V
Lead Channel Pacing Threshold Pulse Width: 0.05 ms
Lead Channel Pacing Threshold Pulse Width: 0.05 ms
Lead Channel Pacing Threshold Pulse Width: 0.4 ms
Lead Channel Pacing Threshold Pulse Width: 0.4 ms
Lead Channel Pacing Threshold Pulse Width: 0.4 ms
Lead Channel Pacing Threshold Pulse Width: 0.4 ms
Lead Channel Sensing Intrinsic Amplitude: 12 mV
Lead Channel Sensing Intrinsic Amplitude: 2.6 mV
Lead Channel Setting Pacing Amplitude: 0.25 V
Lead Channel Setting Pacing Amplitude: 1.5 V
Lead Channel Setting Pacing Amplitude: 2 V
Lead Channel Setting Pacing Pulse Width: 0.05 ms
Lead Channel Setting Pacing Pulse Width: 0.4 ms
Lead Channel Setting Sensing Sensitivity: 2 mV
Pulse Gen Model: 3222
Pulse Gen Serial Number: 9188373

## 2022-07-13 LAB — PROTIME-INR
INR: 1.8 — ABNORMAL HIGH (ref 0.9–1.2)
Prothrombin Time: 18.2 s — ABNORMAL HIGH (ref 9.1–12.0)

## 2022-07-13 MED ORDER — AMIODARONE HCL 200 MG PO TABS: ORAL_TABLET | ORAL | 7 refills | Status: DC

## 2022-07-13 NOTE — Progress Notes (Signed)
Patient Care Team: [facility] as PCP - General Deboraha Sprang, MD as PCP - Electrophysiology (Cardiology) Leonie Man, MD as PCP - Cardiology (Cardiology) Maryellen Pile, MD (Inactive) as Resident (Internal Medicine)   HPI  Ricky Hayden is a 77 y.o. male seen in followup for CRT. St Jude implanted 9/21 I had failed an LV lead placement.  Dr. Lovena Le undertook left bundle branch block area pacing with post implant QRS duration of 106 ms.  RV output is programmed subthreshold   Cardiac consultation note 08/03/2019 describes a low ejection fraction heart failure on and off since 2016.  Denies coronary disease or bypass surgery.  * Hospitalized 4/21 for COVID pneumonia    Numerous hospitalizations secondary to prolonged infections over the last year.Complicated by atrial fibrillation with rapid rates prompting cardioversion.  Again 9/23 started on amiodarone  10/23 presented to the hospital with atrial flutter with RVR successfully paced terminated.  Amiodarone was increased  Issues were raised as to dofetilide, AV nodal ablation in the context of his pacing, or A-fib ablation. Has had discussions regarding continuing amiodarone, AV ablation or PVI.  He has been holding sinus rhythm.  Tolerating amiodarone.  Dyspnea but over the last weeks has had progressive edema in his lower extremities.    Date Cr K Hgb TSH  9//20 (scanned) 1.37 4.2 14.5    6/23 1.27 4.3    13.2 3.345  2/24 1.78 5.1        DATE TEST EF    10/17 Echo  30-35%    10/20 Echo   30-35 %     4/21 Echo  20% RV dysfn severe  5/21 LHC  Cors min obstruction  8/21 Echo  20-25% RV function normal   4/23 Echo  40-45%   9/23 Echo  20-25% RV function severely abnormal    ZIO  PVCs < 1%    Records and Results Reviewed   Past Medical History:  Diagnosis Date   Arthritis    osteoarthritis of left knee   Ascending aorta dilatation (HCC)    Balanitis    recurrent   Cardiac arrhythmia     life threatening, secondary to CCB vs b- blockers   Cardiomyopathy (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 Sherwood Gambler   Nocturia    Obesity    Pansinusitis    a.  complicated by brain abscess and bleeding requiring craniotomy in 1995.   PONV (postoperative nausea and vomiting)    Presence of permanent cardiac pacemaker    Sinusitis    s/p ethmoidectomy and nasal septoplasty   Vertigo    intermitantly    Past Surgical History:  Procedure Laterality Date   BIV UPGRADE N/A 01/29/2020   Procedure: BIV UPGRADE;  Surgeon: Evans Lance, MD;  Location: [facility];  Service: Cardiovascular;  Laterality: N/A;  CARDIOVERSION N/A 09/26/2021   Procedure: CARDIOVERSION;  Surgeon: [name], MD;  Location: [facility];  Service: Cardiovascular;  Laterality: N/A;   CARDIOVERSION N/A 02/03/2022   Procedure: CARDIOVERSION;  Surgeon: Josue Hector, MD;  Location: [facility];  Service: Cardiovascular;  Laterality: N/A;   CIRCUMCISION N/A 11/20/2013   Procedure: CIRCUMCISION ADULT;  Surgeon: Claybon Jabs, MD;  Location: [facility];  Service: Urology;  Laterality: N/A;   CRANIOTOMY  1995   hematomy due to sinus infection    CYSTOSCOPY/URETEROSCOPY/HOLMIUM LASER/STENT PLACEMENT Left 12/23/2021   Procedure: LEFT URETEROSCOPY/HOLMIUM LASER LITHOTRIPSY/STENT PLACEMENT retrograde;  Surgeon: Vira Agar, MD;  Location: [facility];  Service: Urology;  Laterality: Left;  90 MINUTES NEEDED FOR CASE    PACEMAKER IMPLANT N/A 01/17/2020   Procedure: PACEMAKER IMPLANT;  Surgeon: Deboraha Sprang, MD;  Location: [facility];  Service: Cardiovascular;  Laterality: N/A;   RIGHT/LEFT HEART CATH AND CORONARY ANGIOGRAPHY N/A 09/20/2019   Procedure: RIGHT/LEFT HEART CATH AND CORONARY ANGIOGRAPHY;  Surgeon: Jolaine Artist, MD;  Location: [facility];  Service: Cardiovascular;  Laterality: N/A;   shoulder surg rt   1995   SINUS SURGERY WITH INSTATRAK     ethmoidectomy and nasal septum repair   TEE WITHOUT CARDIOVERSION N/A 09/26/2021   Procedure: TRANSESOPHAGEAL ECHOCARDIOGRAM (TEE);  Surgeon: [name], MD;  Location: [facility];  Service: Cardiovascular;  Laterality: N/A;   TEE WITHOUT CARDIOVERSION N/A 02/03/2022   Procedure: TRANSESOPHAGEAL ECHOCARDIOGRAM (TEE);  Surgeon: Josue Hector, MD;  Location: [facility] ENDOSCOPY;  Service: Cardiovascular;  Laterality: N/A;    No outpatient medications have been marked as taking for the 07/13/22 encounter (Office Visit) with Deboraha Sprang, MD.  Med list was reviewed unfortunately was not included in this note  Allergies  Allergen Reactions   Calcium Channel Blockers Other (See Comments)    Came to hospital in 1995-caused chest pain       Review of Systems negative except from HPI and PMH  Physical Exam BP 128/76   Ht '5\' 7"'$  (1.702 m)   Wt 241 lb 3.2 oz (109.4 kg)   BMI 37.78 kg/m  Well developed and nourished in no acute distress HENT normal Neck supple  Clear Device pocket well healed; without hematoma or erythema.  There is no tethering  Regular rate and rhythm, no murmurs or gallops Abd-soft with active BS No Clubbing cyanosis 3+ on the left 1+ on the right edema Skin-warm and dry A & Oriented  Grossly normal sensory and motor function  ECG AV paced at 60 Intervals 22/10/50   Assessment and  Plan  Nonischemic cardiomyopathy  Atrial fibrillation/flutter persistent with rapid ventricular response triggering  heart failure  Left bundle branch block/first-degree AV block   Congestive heart failure-chronic-systolic class III  Renal insufficiency   COPD-oxygen dependent  RV function/dysfunction-variable echoes  CRTP-Saint Jude  The patient's rhythms have been largely quiescent on amiodarone.  Indeed there is been essentially none over the last 4 months.  We will decrease his amiodarone today from 400--300 mg a day.  At this juncture, I would not undertake ablation of either PVI or the AV node  Significantly volume overloaded, his last creatinine had gone up to 1.8, potassium was 5.1.  Will give him 1 increased dose of torsemide tomorrow, I will check his blood work today prior to making a recommendation as to ongoing management of his volume.  Will discuss this with Dr. Reine Just.  Part of this may also be  related to right ventricular failure and diuresis may be aggravating.

## 2022-07-13 NOTE — Patient Instructions (Addendum)
Medication Instructions:  Your physician has recommended you make the following change in your medication:   ** Decrease Amiodarone '200mg'$  - (1 tablet) in the morning and '100mg'$  (1/2 tablet) in the evening.  ** Take Torsemide '40mg'$  (2 tablets) by mouth in the morning and we will call with further directions.  *If you need a refill on your cardiac medications before your next appointment, please call your pharmacy*   Lab Work:  **BMET and PT/INR today  If you have labs (blood work) drawn today and your tests are completely normal, you will receive your results only by: [place] (if you have MyChart) OR A paper copy in the mail If you have any lab test that is abnormal or we need to change your treatment, we will call you to review the results.   Testing/Procedures: None ordered.    Follow-Up: At [facility], you and your health needs are our priority.  As part of our continuing mission to provide you with exceptional heart care, we have created designated Provider Care Teams.  These Care Teams include your primary Cardiologist (physician) and Advanced Practice Providers (APPs -  Physician Assistants and Nurse Practitioners) who all work together to provide you with the care you need, when you need it.  We recommend signing up for the patient portal called "MyChart".  Sign up information is provided on this After Visit Summary.  MyChart is used to connect with patients for Virtual Visits (Telemedicine).  Patients are able to view lab/test results, encounter notes, upcoming appointments, etc.  Non-urgent messages can be sent to your provider as well.   To learn more about what you can do with MyChart, go to [email or web address].    Your next appointment:   6 months with Dr Olin Pia PA, Tommy[facility] Standard

## 2022-07-14 ENCOUNTER — Telehealth: Payer: Self-pay

## 2022-07-14 ENCOUNTER — Ambulatory Visit (INDEPENDENT_AMBULATORY_CARE_PROVIDER_SITE_OTHER): Payer: No Typology Code available for payment source

## 2022-07-14 ENCOUNTER — Ambulatory Visit: Payer: No Typology Code available for payment source

## 2022-07-14 DIAGNOSIS — Z79899 Other long term (current) drug therapy: Secondary | ICD-10-CM

## 2022-07-14 DIAGNOSIS — E875 Hyperkalemia: Secondary | ICD-10-CM

## 2022-07-14 DIAGNOSIS — Z5181 Encounter for therapeutic drug level monitoring: Secondary | ICD-10-CM

## 2022-07-14 NOTE — Patient Instructions (Signed)
Description   Today take 1.5 tablets then continue taking 1 tablet daily except 1.5 tablets on Sunday.  Recheck INR in 3 weeks.  *10/5 Started Amio '200mg'$  BID x 7 days, then '200mg'$  daily*  Anticoagulation Clinic [phone]

## 2022-07-14 NOTE — Addendum Note (Signed)
Addended by: Michelle Nasuti on: 07/14/2022 10:05 AM   Modules accepted: Orders

## 2022-07-14 NOTE — Telephone Encounter (Signed)
Spoke with pt and pt's son and advised per Dr Caryl Comes pt should stop Spironolactone and continue the Torsemide '40mg'$  for 3 days.  Appointment scheduled to recheck BMET on 07/27/2022.  Pt verbalizes understanding and agrees with current plan.

## 2022-07-14 NOTE — Telephone Encounter (Signed)
-----   Message from Deboraha Sprang, MD sent at 07/13/2022  6:25 PM EDT ----- Please Inform Patient  Labs are normal x mildly depressed INR, have informed the Coumadin clinic  Potassium remains elevated 5.3.  Will discontinue spironolactone.  Have him continue to take the torsemide 40 for 3 days   Recheck BMET in 10 days    Thanks

## 2022-07-21 ENCOUNTER — Telehealth (HOSPITAL_COMMUNITY): Payer: Self-pay | Admitting: Family Medicine

## 2022-07-27 ENCOUNTER — Ambulatory Visit: Payer: No Typology Code available for payment source | Attending: Internal Medicine

## 2022-07-27 DIAGNOSIS — Z79899 Other long term (current) drug therapy: Secondary | ICD-10-CM

## 2022-07-27 DIAGNOSIS — E875 Hyperkalemia: Secondary | ICD-10-CM

## 2022-07-28 ENCOUNTER — Encounter (HOSPITAL_COMMUNITY): Payer: No Typology Code available for payment source

## 2022-07-28 LAB — BASIC METABOLIC PANEL
BUN/Creatinine Ratio: 15 (ref 10–24)
BUN: 26 mg/dL (ref 8–27)
CO2: 26 mmol/L (ref 20–29)
Calcium: 9.3 mg/dL (ref 8.6–10.2)
Chloride: 103 mmol/L (ref 96–106)
Creatinine, Ser: 1.68 mg/dL — ABNORMAL HIGH (ref 0.76–1.27)
Glucose: 115 mg/dL — ABNORMAL HIGH (ref 70–99)
Potassium: 5.2 mmol/L (ref 3.5–5.2)
Sodium: 142 mmol/L (ref 134–144)
eGFR: 41 mL/min/{1.73_m2} — ABNORMAL LOW (ref >59)

## 2022-07-31 NOTE — Progress Notes (Signed)
ADVANCED HF CLINIC NOTE   Primary Care: [facility], [facility] EP: Dr. Graciela Husbands HF Cardiologist: Dr. Gala Romney  HPI: Ricky Hayden is a 77 y.o. 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    EF 45-50% in 2016, 30-35% in 2017 and 2020.   CM previously felt to be d/t combination of LBBB and recurrent AF.   Admitted 4/10 to 08/09/19 with COVID PNA and HF. Echo repeated and EF back down to 20% w/ severe RV dysfunction. He recovered from COVID but unfortunately, his wife died from COVID.    Underwent CRT-P in September 2021.   He was admitted to [facility] in 12/22 with a/c CHF and atrial flutter. Underwent successful DCCV. Readmitted in 04/23 with acute respiratory failure, urosepsis and proteus bacteremia. Echo during admit with EF 40-45%. Course further c/b brief PEA arrest, aspiration PNA/HCAP, and atrial flutter with RVR. Underwent successful DCCV. EF 30-35% on TEE.    Admitted again in June 2023 with pyelonephritis and Jardiance stopped. Had afib with RVR but converted to SR spontaneously.    Admitted 9/23 with acute hypoxic respiratory failure secondary to a/c CHF and AF with RVR. Started on IV amiodarone and IV lasix.  Underwent TEE guided DCCV to SR. TEE demonstrated EF 20-25%, RV severely reduced, moderate TR.  Transient hypotension post TEE requiring pressors. Drips weaned and GDMT restarted.  Discharged home, weight 223 lbs.  Seen for hospital f/u 02/12/22. Low-dose Entresto restarted.   Seen in ED 02/24/22 with atrial flutter with RVR. EP was consulted. Successfully converted to SR with overdriving pacing. Amiodarone increased to 200 mg BID.  Note felt to be good candidate for PVI ablation. Planning to see Dr. Graciela Husbands next month to see if potential candidate for high risk PVI ablation.  Returned to ED 03/06/22 with recurrent  atrial flutter. Cardioversion planned but converted to SR while in ED.  Follow up 11/23, volume stable with NYHA II-III and volume stable. Confusion over medications, but he declined paramedicine.   Today he returns for HF follow up with his daughter.-in-law. Feels good. Denies CP. Swelling well controlled. Gets around with walker. Mild DOE and fatigue with longer distances. Jardiance off since 4/23 due to UTI. Says BP 110-125  Echo 05/12/22 EF 30-35% Personally reviewed  Cardiac Studies  - TEE (10/23): EF 20-25%, RV severely reduced, moderate TR, no LAA  - Echo (4/23): EF 40-45%, LV mildly decreased, grade I DD, RV ok  - Echo (4/21): EF 20%, severe RV dysfunction (had COVID PNA)  - Echo (2020): EF 30-35%  - Echo (2017): EF 30-35%  - Echo (2016): EF 45-50%  Past Medical History:  Diagnosis Date   Arthritis    osteoarthritis of left knee   Ascending aorta dilatation (HCC)    Balanitis    recurrent   Cardiac arrhythmia    life threatening, secondary to CCB vs b- blockers   Cardiomyopathy (HCC)    Chronic joint pain    Chronic systolic CHF (congestive heart failure) (HCC)    CKD (chronic kidney disease), stage III (HCC)    COPD (chronic obstructive pulmonary disease) (HCC)    Coronary artery disease    Dilated aortic root (HCC)    Dyspnea    Enlarged prostate    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 Newell Coral   Nocturia    Obesity    Pansinusitis    a.  complicated by brain abscess and bleeding requiring craniotomy in 1995.   PONV (postoperative nausea and vomiting)    Presence of permanent cardiac pacemaker    Sinusitis    s/p ethmoidectomy and nasal septoplasty   Vertigo    intermitantly    Current Outpatient Medications  Medication Sig  Dispense Refill   acetaminophen (TYLENOL) 500 MG tablet Take 1,000 mg by mouth every 6 (six) hours as needed for moderate pain.     albuterol (PROVENTIL HFA;VENTOLIN HFA) 108 (90 BASE) MCG/ACT inhaler Inhale 2 puffs into the lungs every 6 (six) hours as needed for wheezing. 1 Inhaler 11   amiodarone (PACERONE) 200 MG tablet Take 1 tablet by mouth every morning and 1/2 tablet (100mg ) by mouth every evening. 60 tablet 7   carvedilol (COREG) 6.25 MG tablet TAKE 1 TABLET BY MOUTH TWICE A DAY 180 tablet 1   empagliflozin (JARDIANCE) 25 MG TABS tablet Take 1 tablet (25 mg total) by mouth daily. Take 1/2 a tablet once daily 30 tablet 11   feeding supplement (ENSURE ENLIVE / ENSURE PLUS) LIQD Take 237 mLs by mouth 2 (two) times daily between meals. 10000 mL 0   finasteride (PROSCAR) 5 MG tablet TAKE 1/2 TABLET BY MOUTH DAILY 30 tablet 0   fluticasone-salmeterol (WIXELA INHUB) 250-50 MCG/ACT AEPB Inhale 1 puff into the lungs in the morning and at bedtime.     Melatonin 10 MG CAPS Take 10 mg by mouth at bedtime as needed (sleep).     Multiple Vitamins-Minerals (PRESERVISION AREDS 2 PO) Take 1 tablet by mouth in the morning and at bedtime.     pantoprazole (PROTONIX) 40 MG tablet Take 1 tablet (40 mg total) by mouth daily. 30 tablet 0   PHENobarbital (LUMINAL) 64.8 MG tablet Take 1 tablet (64.8 mg total) by mouth 2 (two) times daily. 180 tablet 3   polyethylene glycol (MIRALAX / GLYCOLAX) 17 g packet Take 17 g by mouth 2 (two) times daily. (Patient taking differently: Take 17 g by mouth daily.) 60 each 1   pravastatin (PRAVACHOL) 40 MG tablet TAKE 1 TABLET BY MOUTH EVERY DAY IN THE EVENING 90 tablet 0   sacubitril-valsartan (ENTRESTO) 24-26 MG Take 1 tablet by mouth 2 (two) times daily. For heart failure 60 tablet 8   spironolactone (ALDACTONE) 25 MG tablet Take 0.5 tablets (12.5 mg total) by mouth daily. 45 tablet 3   Tiotropium Bromide Monohydrate (SPIRIVA RESPIMAT) 2.5 MCG/ACT AERS Inhale 2 each into the  lungs every evening.     torsemide (DEMADEX) 20 MG tablet Take 1 tablet (20 mg total) by mouth every Monday, Wednesday, and Friday. 15 tablet 7   Vitamin D3 (VITAMIN D) 25 MCG tablet Take 1 tablet (1,000 Units total) by mouth daily. 60 tablet 0   warfarin (COUMADIN) 3 MG tablet TAKE 1 TABLET to 1 1/2 TABLETS BY MOUTH DAILY OR AS DIRECTED BY COUMADIN CLINIC 35 tablet 2   No current facility-administered medications for this visit.   Allergies  Allergen Reactions   Calcium Channel Blockers Other (See Comments)    Came to hospital in 1995-caused chest pain    Social History   Socioeconomic History   Marital status: Widowed    Spouse  name: Not on file   Number of children: Not on file   Years of education: Not on file   Highest education level: Not on file  Occupational History   Not on file  Tobacco Use   Smoking status: Former    Types: Cigarettes    Quit date: 05/04/1986    Years since quitting: 36.2   Smokeless tobacco: Never   Tobacco comments:    Former smoker 02/12/22  Vaping Use   Vaping Use: Never used  Substance and Sexual Activity   Alcohol use: Not Currently   Drug use: No   Sexual activity: Yes  Other Topics Concern   Not on file  Social History Narrative   Not on file   Social Determinants of Health   Financial Resource Strain: Not on file  Food Insecurity: No Food Insecurity (02/01/2022)   Hunger Vital Sign    Worried About Running Out of Food in the Last Year: Never true    Ran Out of Food in the Last Year: Never true  Transportation Needs: No Transportation Needs (02/01/2022)   PRAPARE - Administrator, Civil Service (Medical): No    Lack of Transportation (Non-Medical): No  Physical Activity: Not on file  Stress: Not on file  Social Connections: Not on file  Intimate Partner Violence: Not At Risk (02/01/2022)   Humiliation, Afraid, Rape, and Kick questionnaire    Fear of Current or Ex-Partner: No    Emotionally Abused: No    Physically  Abused: No    Sexually Abused: No   Family History  Problem Relation Age of Onset   Heart failure Mother 66   Heart attack Father 77   There were no vitals taken for this visit.  Wt Readings from Last 3 Encounters:  07/13/22 109.4 kg (241 lb 3.2 oz)  06/04/22 106.7 kg (235 lb 3.2 oz)  04/10/22 101 kg (222 lb 9.6 oz)   PHYSICAL EXAM: General:  NAD. No resp difficulty, walked into clinic with rolling walker. HEENT: normal Neck: supple. no JVD. Carotids 2+ bilat; no bruits. No lymphadenopathy or thryomegaly appreciated. Cor: PMI nondisplaced. Regular rate & rhythm. No rubs, gallops or murmurs. Lungs: clear Abdomen: obese soft, nontender, nondistended. No hepatosplenomegaly. No bruits or masses. Good bowel sounds. Extremities: no cyanosis, clubbing, rash, edema Neuro: alert & orientedx3, cranial nerves grossly intact. moves all 4 extremities w/o difficulty. Affect pleasant  Device interrogation (personally reviewed): Volume looks good. No VT/AF 97% BPacing Personally reviewed  ASSESSMENT & PLAN:  Chronic biventricular heart failure/NICM - cardiomyopathy dates back to at least 2016.  - EF variable over the years. CM previously thought to be d/t LBBB +/- recurrent AF.  - S/p CRT-P (9/21). - Multiple admissions for a/c CHF and recurrent AF this past year - Echo (2021): EF < 20% . LHC with no significant CAD. - Echo (4/23): EF 40-45% - Echo (09/26/21): EF 30-35% in setting of AF - TEE (10/23): EF 20-25%, RV severely reduced, severe BAE, moderate TR - Stable NYHA II-III, confounded by body habitus and deconditioning. Volume looks okay on exam and CorVue.  - Continue carvedilol 6.25 mg bid. - Restart Jardiance. Watch for recurrent UTI - Continue torsemide 20 mg MWF - Continue Entresto 24/26 mg bid - Continue spiro 12.5 mg daily.  - Labs today.  2. Atrial fibrillation/flutter - Known history with multiple prior cardioversions - Multiple previous admits with Afib and flutter - S/p  TEE/DCCV to SR in 10/23. Had recurrent AFL and  had overdrive pacing in ED 02/24/22 - Most recently in ED 03/06/22. Converted to SR while in ED. - Remains in NSR today - On amiodarone 200 mg BID per EP (saw Dr. Graciela Husbands 12/23) - not felt to be candidate for PVI ablation  - CHA2DS2-VASc score is 6. - On coumadin. Not on DOAC d/t interaction with phenobarbital use (see below) - INR followed by Coumadin Clinic   3. CKD 3a - Baseline SCr 1.5-1.8. - Labs today.  4. H/o brain abscess - S/p craniotomy 2015 - Has been on phenobarbital since 1995, no seizures.   Jacklynn Ganong, FNP  4:26 PM

## 2022-07-31 NOTE — H&P (View-Only) (Signed)
 ADVANCED HF CLINIC NOTE   Primary Care: [facility], [facility] EP: Dr. Klein HF Cardiologist: Dr. Bensimhon  HPI: Ricky Hayden is a 76 y.o. 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    EF 45-50% in 2016, 30-35% in 2017 and 2020.   CM previously felt to be d/t combination of LBBB and recurrent AF.   Admitted 4/10 to 08/09/19 with COVID PNA and HF. Echo repeated and EF back down to 20% w/ severe RV dysfunction. He recovered from COVID but unfortunately, his wife died from COVID.    Underwent CRT-P in September 2021.   He was admitted to [facility] in 12/22 with a/c CHF and atrial flutter. Underwent successful DCCV. Readmitted in 04/23 with acute respiratory failure, urosepsis and proteus bacteremia. Echo during admit with EF 40-45%. Course further c/b brief PEA arrest, aspiration PNA/HCAP, and atrial flutter with RVR. Underwent successful DCCV. EF 30-35% on TEE.    Admitted again in June 2023 with pyelonephritis and Jardiance stopped. Had afib with RVR but converted to SR spontaneously.    Admitted 9/23 with acute hypoxic respiratory failure secondary to a/c CHF and AF with RVR. Started on IV amiodarone and IV lasix.  Underwent TEE guided DCCV to SR. TEE demonstrated EF 20-25%, RV severely reduced, moderate TR.  Transient hypotension post TEE requiring pressors. Drips weaned and GDMT restarted.  Discharged home, weight 223 lbs.  Seen for hospital f/u 02/12/22. Low-dose Entresto restarted.   Seen in ED 02/24/22 with atrial flutter with RVR. EP was consulted. Successfully converted to SR with overdriving pacing. Amiodarone increased to 200 mg BID.  Note felt to be good candidate for PVI ablation. Planning to see Dr. Klein next month to see if potential candidate for high risk PVI ablation.  Returned to ED 03/06/22 with recurrent  atrial flutter. Cardioversion planned but converted to SR while in ED.  Follow up 11/23, volume stable with NYHA II-III and volume stable. Confusion over medications, but he declined paramedicine.   Today he returns for HF follow up with his daughter.-in-law. Feels good. Denies CP. Swelling well controlled. Gets around with walker. Mild DOE and fatigue with longer distances. Jardiance off since 4/23 due to UTI. Says BP 110-125  Echo 05/12/22 EF 30-35% Personally reviewed  Cardiac Studies  - TEE (10/23): EF 20-25%, RV severely reduced, moderate TR, no LAA  - Echo (4/23): EF 40-45%, LV mildly decreased, grade I DD, RV ok  - Echo (4/21): EF 20%, severe RV dysfunction (had COVID PNA)  - Echo (2020): EF 30-35%  - Echo (2017): EF 30-35%  - Echo (2016): EF 45-50%  Past Medical History:  Diagnosis Date   Arthritis    osteoarthritis of left knee   Ascending aorta dilatation (HCC)    Balanitis    recurrent   Cardiac arrhythmia    life threatening, secondary to CCB vs b- blockers   Cardiomyopathy (HCC)    Chronic joint pain    Chronic systolic CHF (congestive heart failure) (HCC)    CKD (chronic kidney disease), stage III (HCC)    COPD (chronic obstructive pulmonary disease) (HCC)    Coronary artery disease    Dilated aortic root (HCC)    Dyspnea    Enlarged prostate    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 Nudelman   Nocturia    Obesity    Pansinusitis    a.  complicated by brain abscess and bleeding requiring craniotomy in 1995.   PONV (postoperative nausea and vomiting)    Presence of permanent cardiac pacemaker    Sinusitis    s/p ethmoidectomy and nasal septoplasty   Vertigo    intermitantly    Current Outpatient Medications  Medication Sig  Dispense Refill   acetaminophen (TYLENOL) 500 MG tablet Take 1,000 mg by mouth every 6 (six) hours as needed for moderate pain.     albuterol (PROVENTIL HFA;VENTOLIN HFA) 108 (90 BASE) MCG/ACT inhaler Inhale 2 puffs into the lungs every 6 (six) hours as needed for wheezing. 1 Inhaler 11   amiodarone (PACERONE) 200 MG tablet Take 1 tablet by mouth every morning and 1/2 tablet (100mg) by mouth every evening. 60 tablet 7   carvedilol (COREG) 6.25 MG tablet TAKE 1 TABLET BY MOUTH TWICE A DAY 180 tablet 1   empagliflozin (JARDIANCE) 25 MG TABS tablet Take 1 tablet (25 mg total) by mouth daily. Take 1/2 a tablet once daily 30 tablet 11   feeding supplement (ENSURE ENLIVE / ENSURE PLUS) LIQD Take 237 mLs by mouth 2 (two) times daily between meals. 10000 mL 0   finasteride (PROSCAR) 5 MG tablet TAKE 1/2 TABLET BY MOUTH DAILY 30 tablet 0   fluticasone-salmeterol (WIXELA INHUB) 250-50 MCG/ACT AEPB Inhale 1 puff into the lungs in the morning and at bedtime.     Melatonin 10 MG CAPS Take 10 mg by mouth at bedtime as needed (sleep).     Multiple Vitamins-Minerals (PRESERVISION AREDS 2 PO) Take 1 tablet by mouth in the morning and at bedtime.     pantoprazole (PROTONIX) 40 MG tablet Take 1 tablet (40 mg total) by mouth daily. 30 tablet 0   PHENobarbital (LUMINAL) 64.8 MG tablet Take 1 tablet (64.8 mg total) by mouth 2 (two) times daily. 180 tablet 3   polyethylene glycol (MIRALAX / GLYCOLAX) 17 g packet Take 17 g by mouth 2 (two) times daily. (Patient taking differently: Take 17 g by mouth daily.) 60 each 1   pravastatin (PRAVACHOL) 40 MG tablet TAKE 1 TABLET BY MOUTH EVERY DAY IN THE EVENING 90 tablet 0   sacubitril-valsartan (ENTRESTO) 24-26 MG Take 1 tablet by mouth 2 (two) times daily. For heart failure 60 tablet 8   spironolactone (ALDACTONE) 25 MG tablet Take 0.5 tablets (12.5 mg total) by mouth daily. 45 tablet 3   Tiotropium Bromide Monohydrate (SPIRIVA RESPIMAT) 2.5 MCG/ACT AERS Inhale 2 each into the  lungs every evening.     torsemide (DEMADEX) 20 MG tablet Take 1 tablet (20 mg total) by mouth every Monday, Wednesday, and Friday. 15 tablet 7   Vitamin D3 (VITAMIN D) 25 MCG tablet Take 1 tablet (1,000 Units total) by mouth daily. 60 tablet 0   warfarin (COUMADIN) 3 MG tablet TAKE 1 TABLET to 1 1/2 TABLETS BY MOUTH DAILY OR AS DIRECTED BY COUMADIN CLINIC 35 tablet 2   No current facility-administered medications for this visit.   Allergies  Allergen Reactions   Calcium Channel Blockers Other (See Comments)    Came to hospital in 1995-caused chest pain    Social History   Socioeconomic History   Marital status: Widowed    Spouse   name: Not on file   Number of children: Not on file   Years of education: Not on file   Highest education level: Not on file  Occupational History   Not on file  Tobacco Use   Smoking status: Former    Types: Cigarettes    Quit date: 05/04/1986    Years since quitting: 36.2   Smokeless tobacco: Never   Tobacco comments:    Former smoker 02/12/22  Vaping Use   Vaping Use: Never used  Substance and Sexual Activity   Alcohol use: Not Currently   Drug use: No   Sexual activity: Yes  Other Topics Concern   Not on file  Social History Narrative   Not on file   Social Determinants of Health   Financial Resource Strain: Not on file  Food Insecurity: No Food Insecurity (02/01/2022)   Hunger Vital Sign    Worried About Running Out of Food in the Last Year: Never true    Ran Out of Food in the Last Year: Never true  Transportation Needs: No Transportation Needs (02/01/2022)   PRAPARE - Transportation    Lack of Transportation (Medical): No    Lack of Transportation (Non-Medical): No  Physical Activity: Not on file  Stress: Not on file  Social Connections: Not on file  Intimate Partner Violence: Not At Risk (02/01/2022)   Humiliation, Afraid, Rape, and Kick questionnaire    Fear of Current or Ex-Partner: No    Emotionally Abused: No    Physically  Abused: No    Sexually Abused: No   Family History  Problem Relation Age of Onset   Heart failure Mother 92   Heart attack Father 58   There were no vitals taken for this visit.  Wt Readings from Last 3 Encounters:  07/13/22 109.4 kg (241 lb 3.2 oz)  06/04/22 106.7 kg (235 lb 3.2 oz)  04/10/22 101 kg (222 lb 9.6 oz)   PHYSICAL EXAM: General:  NAD. No resp difficulty, walked into clinic with rolling walker. HEENT: normal Neck: supple. no JVD. Carotids 2+ bilat; no bruits. No lymphadenopathy or thryomegaly appreciated. Cor: PMI nondisplaced. Regular rate & rhythm. No rubs, gallops or murmurs. Lungs: clear Abdomen: obese soft, nontender, nondistended. No hepatosplenomegaly. No bruits or masses. Good bowel sounds. Extremities: no cyanosis, clubbing, rash, edema Neuro: alert & orientedx3, cranial nerves grossly intact. moves all 4 extremities w/o difficulty. Affect pleasant  Device interrogation (personally reviewed): Volume looks good. No VT/AF 97% BPacing Personally reviewed  ASSESSMENT & PLAN:  Chronic biventricular heart failure/NICM - cardiomyopathy dates back to at least 2016.  - EF variable over the years. CM previously thought to be d/t LBBB +/- recurrent AF.  - S/p CRT-P (9/21). - Multiple admissions for a/c CHF and recurrent AF this past year - Echo (2021): EF < 20% . LHC with no significant CAD. - Echo (4/23): EF 40-45% - Echo (09/26/21): EF 30-35% in setting of AF - TEE (10/23): EF 20-25%, RV severely reduced, severe BAE, moderate TR - Stable NYHA II-III, confounded by body habitus and deconditioning. Volume looks okay on exam and CorVue.  - Continue carvedilol 6.25 mg bid. - Restart Jardiance. Watch for recurrent UTI - Continue torsemide 20 mg MWF - Continue Entresto 24/26 mg bid - Continue spiro 12.5 mg daily.  - Labs today.  2. Atrial fibrillation/flutter - Known history with multiple prior cardioversions - Multiple previous admits with Afib and flutter - S/p  TEE/DCCV to SR in 10/23. Had recurrent AFL and   had overdrive pacing in ED 02/24/22 - Most recently in ED 03/06/22. Converted to SR while in ED. - Remains in NSR today - On amiodarone 200 mg BID per EP (saw Dr. Klein 12/23) - not felt to be candidate for PVI ablation  - CHA2DS2-VASc score is 6. - On coumadin. Not on DOAC d/t interaction with phenobarbital use (see below) - INR followed by [facility]   3. CKD 3a - Baseline SCr 1.5-1.8. - Labs today.  4. H/o brain abscess - S/p craniotomy 2015 - Has been on phenobarbital since 1995, no seizures.   Demerius Podolak M Jaton Eilers, FNP  4:26 PM 

## 2022-08-03 ENCOUNTER — Telehealth (HOSPITAL_COMMUNITY): Payer: Self-pay

## 2022-08-03 NOTE — Telephone Encounter (Signed)
Called to confirm/remind patient of their appointment at the [facility] on 08/04/22.   Patient reminded to bring all medications and/or complete list.  Confirmed patient has transportation. Gave directions, instructed to utilize [address] parking.  Confirmed appointment prior to ending call.

## 2022-08-04 ENCOUNTER — Ambulatory Visit (INDEPENDENT_AMBULATORY_CARE_PROVIDER_SITE_OTHER): Payer: No Typology Code available for payment source

## 2022-08-04 ENCOUNTER — Ambulatory Visit
Admission: RE | Admit: 2022-08-04 | Discharge: 2022-08-04 | Disposition: A | Discharge status: Home / Self Care | Payer: No Typology Code available for payment source | Source: Ambulatory Visit | Attending: Family Medicine | Admitting: Family Medicine

## 2022-08-04 ENCOUNTER — Other Ambulatory Visit (HOSPITAL_COMMUNITY): Payer: Self-pay

## 2022-08-04 ENCOUNTER — Encounter (HOSPITAL_COMMUNITY): Payer: Self-pay

## 2022-08-04 VITALS — BP 122/70 | HR 62 | Wt 247.8 lb

## 2022-08-04 DIAGNOSIS — E785 Hyperlipidemia, unspecified: Secondary | ICD-10-CM | POA: Diagnosis not present

## 2022-08-04 DIAGNOSIS — I5022 Chronic systolic (congestive) heart failure: Secondary | ICD-10-CM | POA: Diagnosis not present

## 2022-08-04 DIAGNOSIS — I4891 Unspecified atrial fibrillation: Secondary | ICD-10-CM

## 2022-08-04 DIAGNOSIS — J440 Chronic obstructive pulmonary disease with acute lower respiratory infection: Secondary | ICD-10-CM | POA: Insufficient documentation

## 2022-08-04 DIAGNOSIS — Z7901 Long term (current) use of anticoagulants: Secondary | ICD-10-CM | POA: Diagnosis not present

## 2022-08-04 DIAGNOSIS — I4892 Unspecified atrial flutter: Secondary | ICD-10-CM | POA: Insufficient documentation

## 2022-08-04 DIAGNOSIS — Z5181 Encounter for therapeutic drug level monitoring: Secondary | ICD-10-CM | POA: Diagnosis not present

## 2022-08-04 DIAGNOSIS — I447 Left bundle-branch block, unspecified: Secondary | ICD-10-CM | POA: Insufficient documentation

## 2022-08-04 DIAGNOSIS — Z8661 Personal history of infections of the central nervous system: Secondary | ICD-10-CM | POA: Diagnosis not present

## 2022-08-04 DIAGNOSIS — E669 Obesity, unspecified: Secondary | ICD-10-CM | POA: Insufficient documentation

## 2022-08-04 DIAGNOSIS — I130 Hypertensive heart and chronic kidney disease with heart failure and stage 1 through stage 4 chronic kidney disease, or unspecified chronic kidney disease: Secondary | ICD-10-CM | POA: Insufficient documentation

## 2022-08-04 DIAGNOSIS — G060 Intracranial abscess and granuloma: Secondary | ICD-10-CM

## 2022-08-04 DIAGNOSIS — E1122 Type 2 diabetes mellitus with diabetic chronic kidney disease: Secondary | ICD-10-CM | POA: Insufficient documentation

## 2022-08-04 DIAGNOSIS — Z79899 Other long term (current) drug therapy: Secondary | ICD-10-CM | POA: Insufficient documentation

## 2022-08-04 DIAGNOSIS — Z7951 Long term (current) use of inhaled steroids: Secondary | ICD-10-CM | POA: Diagnosis not present

## 2022-08-04 DIAGNOSIS — Z7984 Long term (current) use of oral hypoglycemic drugs: Secondary | ICD-10-CM | POA: Diagnosis not present

## 2022-08-04 DIAGNOSIS — I5082 Biventricular heart failure: Secondary | ICD-10-CM | POA: Diagnosis not present

## 2022-08-04 DIAGNOSIS — Z6838 Body mass index (BMI) 38.0-38.9, adult: Secondary | ICD-10-CM | POA: Diagnosis not present

## 2022-08-04 DIAGNOSIS — E875 Hyperkalemia: Secondary | ICD-10-CM | POA: Diagnosis not present

## 2022-08-04 DIAGNOSIS — N1831 Chronic kidney disease, stage 3a: Secondary | ICD-10-CM | POA: Diagnosis not present

## 2022-08-04 DIAGNOSIS — K219 Gastro-esophageal reflux disease without esophagitis: Secondary | ICD-10-CM | POA: Diagnosis not present

## 2022-08-04 DIAGNOSIS — I2510 Atherosclerotic heart disease of native coronary artery without angina pectoris: Secondary | ICD-10-CM | POA: Insufficient documentation

## 2022-08-04 DIAGNOSIS — Z87442 Personal history of urinary calculi: Secondary | ICD-10-CM | POA: Diagnosis not present

## 2022-08-04 DIAGNOSIS — I428 Other cardiomyopathies: Secondary | ICD-10-CM | POA: Insufficient documentation

## 2022-08-04 DIAGNOSIS — Z950 Presence of cardiac pacemaker: Secondary | ICD-10-CM | POA: Insufficient documentation

## 2022-08-04 DIAGNOSIS — Z8616 Personal history of COVID-19: Secondary | ICD-10-CM | POA: Insufficient documentation

## 2022-08-04 DIAGNOSIS — Z87891 Personal history of nicotine dependence: Secondary | ICD-10-CM | POA: Insufficient documentation

## 2022-08-04 LAB — PROTIME-INR
INR: 1.6 — ABNORMAL HIGH (ref 0.8–1.2)
Prothrombin Time: 19 seconds — ABNORMAL HIGH (ref 11.4–15.2)

## 2022-08-04 LAB — BASIC METABOLIC PANEL
Anion gap: 8 (ref 5–15)
BUN: 25 mg/dL — ABNORMAL HIGH (ref 8–23)
CO2: 31 mmol/L (ref 22–32)
Calcium: 9.8 mg/dL (ref 8.9–10.3)
Chloride: 104 mmol/L (ref 98–111)
Creatinine, Ser: 1.76 mg/dL — ABNORMAL HIGH (ref 0.61–1.24)
GFR, Estimated: 39 mL/min — ABNORMAL LOW (ref >60)
Glucose, Bld: 87 mg/dL (ref 70–99)
Potassium: 5.3 mmol/L — ABNORMAL HIGH (ref 3.5–5.1)
Sodium: 143 mmol/L (ref 135–145)

## 2022-08-04 LAB — BRAIN NATRIURETIC PEPTIDE: B Natriuretic Peptide: 155.1 pg/mL — ABNORMAL HIGH (ref 0.0–100.0)

## 2022-08-04 MED ORDER — TORSEMIDE 20 MG PO TABS: 40.0000 mg | ORAL_TABLET | ORAL | 11 refills | Status: DC

## 2022-08-04 MED ORDER — EMPAGLIFLOZIN 25 MG PO TABS: ORAL_TABLET | ORAL | 11 refills | Status: DC

## 2022-08-04 NOTE — Patient Instructions (Signed)
INCREASE Torsemide to 40 mg daily RESTART Jardiance 12.5 mg (one half tab)  Labs today We will only contact you if something comes back abnormal or we need to make some changes. Otherwise no news is good news!  Labs needed in one week  Your physician wants you to follow-up in: 3 months with Dr Haroldine Laws (July 2024) You will receive a reminder letter in the mail two months in advance. If you don't receive a letter, please call our office to schedule the follow-up appointment.   Do the following things EVERYDAY: Weigh yourself in the morning before breakfast. Write it down and keep it in a log. Take your medicines as prescribed Eat low salt foods--Limit salt (sodium) to 2000 mg per day.  Stay as active as you can everyday Limit all fluids for the day to less than 2 liters  At the [facility], you and your health needs are our priority. As part of our continuing mission to provide you with exceptional heart care, we have created designated Provider Care Teams. These Care Teams include your primary Cardiologist (physician) and Advanced Practice Providers (APPs- Physician Assistants and Nurse Practitioners) who all work together to provide you with the care you need, when you need it.   You may see any of the following providers on your designated Care Team at your next follow up: Dr Glori Bickers Dr Loralie Champagne Dr. Roxana Hires, NP Lyda Jester, [place] [facility] [place], [place] Forestine Na, NP Audry Riles, PharmD   Please be sure to bring in all your medications bottles to every appointment.    Thank you for choosing [facility]   If you have any questions or concerns before your next appointment please send [place] a message through [place] or call our office at [phone].    TO LEAVE A MESSAGE FOR THE NURSE SELECT OPTION 2, PLEASE LEAVE A MESSAGE INCLUDING: YOUR NAME DATE OF BIRTH CALL BACK  NUMBER REASON FOR CALL**this is important as we prioritize the call backs  YOU WILL RECEIVE A CALL BACK THE SAME DAY AS LONG AS YOU CALL BEFORE 4:00 PM

## 2022-08-04 NOTE — Progress Notes (Signed)
Medication Samples have been provided to the patient.  Drug name: Jardiance       Strength: 10 mg        Qty: 28  LOTSM:[id number]  Exp.Date: 1/26  Dosing instructions: Take 1 tablet by mouth daily  The patient has been instructed regarding the correct time, dose, and frequency of taking this medication, including desired effects and most common side effects.   Tiney Rouge Krista Som 12:42 PM 08/04/2022

## 2022-08-04 NOTE — Progress Notes (Signed)
ReDS Vest / Clip - 08/04/22 1200       ReDS Vest / Clip   Station Marker C    Ruler Value 31    ReDS Value Range High volume overload    ReDS Actual Value 45

## 2022-08-04 NOTE — Patient Instructions (Signed)
Description   Today take 1.5 tablets then start taking 1 tablet daily except 1.5 tablets on Sundays and Wednesdays. Recheck INR in 2 weeks.  Anticoagulation Clinic [phone]

## 2022-08-05 ENCOUNTER — Ambulatory Visit: Payer: No Typology Code available for payment source

## 2022-08-05 ENCOUNTER — Telehealth (HOSPITAL_COMMUNITY): Payer: Self-pay

## 2022-08-05 NOTE — Telephone Encounter (Signed)
Patient called and stated his HR went up to 120 and this morning was still up.  It has leveled out now back in the 60's but BP is 91/69.  She wanted to know if this was ok. He is taking his Torsemide as changed. She denies shortness of breath.  Please advise

## 2022-08-05 NOTE — Telephone Encounter (Signed)
I spoke to patient and niece and updated on information.  They are going to call Dr. Caryl Comes

## 2022-08-06 ENCOUNTER — Telehealth: Payer: Self-pay | Admitting: Internal Medicine

## 2022-08-06 NOTE — Telephone Encounter (Signed)
Spoke with pt's niece and advised pt's device is followed by the [place] and she should contact pt's cardiologist with the [facility] and advise transmission has been sent for review due to elevated HR.  Reviewed ED precautions.  Pt's niece verbalizes understanding and agrees with current plan.

## 2022-08-06 NOTE — Telephone Encounter (Signed)
Patient is currently followed by the [facility], so unable to see remote transmissions.

## 2022-08-06 NOTE — Telephone Encounter (Signed)
STAT if HR is under 50 or over 120 (normal HR is 60-100 beats per minute)  What is your heart rate?  82 most recently, 92/64 BP  Do you have a log of your heart rate readings (document readings)?  127 highest (earlier this morning)   Do you have any other symptoms?  HR is increasing in the evening and dropping in the morning. No additional symptoms that niece is aware of, but she mentions Lasix is working and patient lost 5 lbs

## 2022-08-06 NOTE — Telephone Encounter (Signed)
Spoke with pt who gives verbal permission to speak with his niece.  She reports pt's HR has been elevated since Tuesday evening.  Current HR 130  She states pt's Torsemide was increased on Tuesday at his appointment in CHF clinic.  Pt denies current CP or dizziness.   Requested pt send a device transmission for further review.  Pt's niece is agreeable and verbalizes understanding.  Will forward to device clinic for review.

## 2022-08-07 ENCOUNTER — Encounter (HOSPITAL_COMMUNITY)
Admission: EM | Disposition: A | Discharge status: Home / Self Care | Payer: Self-pay | Source: Home / Self Care | Attending: Cardiology

## 2022-08-07 ENCOUNTER — Inpatient Hospital Stay (HOSPITAL_COMMUNITY): Payer: No Typology Code available for payment source | Admitting: Anesthesiology

## 2022-08-07 ENCOUNTER — Inpatient Hospital Stay (HOSPITAL_COMMUNITY): Payer: No Typology Code available for payment source

## 2022-08-07 ENCOUNTER — Inpatient Hospital Stay (HOSPITAL_COMMUNITY)
Admission: EM | Admit: 2022-08-07 | Discharge: 2022-08-09 | DRG: 308 | Disposition: A | Discharge status: Home / Self Care | Payer: No Typology Code available for payment source | Attending: Cardiology | Admitting: Cardiology

## 2022-08-07 ENCOUNTER — Other Ambulatory Visit: Payer: Self-pay

## 2022-08-07 ENCOUNTER — Emergency Department (HOSPITAL_COMMUNITY): Payer: No Typology Code available for payment source

## 2022-08-07 ENCOUNTER — Encounter (HOSPITAL_COMMUNITY): Payer: Self-pay | Admitting: Cardiology

## 2022-08-07 DIAGNOSIS — I5043 Acute on chronic combined systolic (congestive) and diastolic (congestive) heart failure: Secondary | ICD-10-CM | POA: Diagnosis present

## 2022-08-07 DIAGNOSIS — Z8661 Personal history of infections of the central nervous system: Secondary | ICD-10-CM

## 2022-08-07 DIAGNOSIS — Z87891 Personal history of nicotine dependence: Secondary | ICD-10-CM

## 2022-08-07 DIAGNOSIS — Z8616 Personal history of COVID-19: Secondary | ICD-10-CM

## 2022-08-07 DIAGNOSIS — K219 Gastro-esophageal reflux disease without esophagitis: Secondary | ICD-10-CM | POA: Diagnosis present

## 2022-08-07 DIAGNOSIS — I2510 Atherosclerotic heart disease of native coronary artery without angina pectoris: Secondary | ICD-10-CM | POA: Diagnosis present

## 2022-08-07 DIAGNOSIS — I5082 Biventricular heart failure: Secondary | ICD-10-CM | POA: Diagnosis present

## 2022-08-07 DIAGNOSIS — I447 Left bundle-branch block, unspecified: Secondary | ICD-10-CM | POA: Diagnosis present

## 2022-08-07 DIAGNOSIS — E1122 Type 2 diabetes mellitus with diabetic chronic kidney disease: Secondary | ICD-10-CM | POA: Diagnosis present

## 2022-08-07 DIAGNOSIS — M1712 Unilateral primary osteoarthritis, left knee: Secondary | ICD-10-CM | POA: Diagnosis present

## 2022-08-07 DIAGNOSIS — N400 Enlarged prostate without lower urinary tract symptoms: Secondary | ICD-10-CM | POA: Diagnosis present

## 2022-08-07 DIAGNOSIS — R072 Precordial pain: Secondary | ICD-10-CM | POA: Diagnosis present

## 2022-08-07 DIAGNOSIS — E875 Hyperkalemia: Secondary | ICD-10-CM | POA: Diagnosis present

## 2022-08-07 DIAGNOSIS — N189 Chronic kidney disease, unspecified: Secondary | ICD-10-CM | POA: Diagnosis not present

## 2022-08-07 DIAGNOSIS — I10 Essential (primary) hypertension: Secondary | ICD-10-CM | POA: Diagnosis present

## 2022-08-07 DIAGNOSIS — I130 Hypertensive heart and chronic kidney disease with heart failure and stage 1 through stage 4 chronic kidney disease, or unspecified chronic kidney disease: Secondary | ICD-10-CM | POA: Diagnosis present

## 2022-08-07 DIAGNOSIS — I4892 Unspecified atrial flutter: Secondary | ICD-10-CM

## 2022-08-07 DIAGNOSIS — J449 Chronic obstructive pulmonary disease, unspecified: Secondary | ICD-10-CM | POA: Diagnosis present

## 2022-08-07 DIAGNOSIS — I4819 Other persistent atrial fibrillation: Secondary | ICD-10-CM | POA: Diagnosis not present

## 2022-08-07 DIAGNOSIS — I428 Other cardiomyopathies: Secondary | ICD-10-CM | POA: Diagnosis present

## 2022-08-07 DIAGNOSIS — Z888 Allergy status to other drugs, medicaments and biological substances status: Secondary | ICD-10-CM

## 2022-08-07 DIAGNOSIS — J9621 Acute and chronic respiratory failure with hypoxia: Secondary | ICD-10-CM | POA: Diagnosis present

## 2022-08-07 DIAGNOSIS — I4891 Unspecified atrial fibrillation: Secondary | ICD-10-CM | POA: Diagnosis not present

## 2022-08-07 DIAGNOSIS — Z7901 Long term (current) use of anticoagulants: Secondary | ICD-10-CM

## 2022-08-07 DIAGNOSIS — I5023 Acute on chronic systolic (congestive) heart failure: Secondary | ICD-10-CM | POA: Diagnosis not present

## 2022-08-07 DIAGNOSIS — Z950 Presence of cardiac pacemaker: Secondary | ICD-10-CM | POA: Diagnosis present

## 2022-08-07 DIAGNOSIS — J9622 Acute and chronic respiratory failure with hypercapnia: Secondary | ICD-10-CM | POA: Diagnosis present

## 2022-08-07 DIAGNOSIS — D631 Anemia in chronic kidney disease: Secondary | ICD-10-CM | POA: Diagnosis not present

## 2022-08-07 DIAGNOSIS — Z7951 Long term (current) use of inhaled steroids: Secondary | ICD-10-CM

## 2022-08-07 DIAGNOSIS — Z87442 Personal history of urinary calculi: Secondary | ICD-10-CM

## 2022-08-07 DIAGNOSIS — R002 Palpitations: Secondary | ICD-10-CM | POA: Diagnosis present

## 2022-08-07 DIAGNOSIS — Z7984 Long term (current) use of oral hypoglycemic drugs: Secondary | ICD-10-CM

## 2022-08-07 DIAGNOSIS — I5022 Chronic systolic (congestive) heart failure: Secondary | ICD-10-CM

## 2022-08-07 DIAGNOSIS — G40909 Epilepsy, unspecified, not intractable, without status epilepticus: Secondary | ICD-10-CM | POA: Diagnosis present

## 2022-08-07 DIAGNOSIS — I509 Heart failure, unspecified: Secondary | ICD-10-CM | POA: Diagnosis not present

## 2022-08-07 DIAGNOSIS — E785 Hyperlipidemia, unspecified: Secondary | ICD-10-CM | POA: Diagnosis present

## 2022-08-07 DIAGNOSIS — Z79899 Other long term (current) drug therapy: Secondary | ICD-10-CM

## 2022-08-07 DIAGNOSIS — Z6838 Body mass index (BMI) 38.0-38.9, adult: Secondary | ICD-10-CM

## 2022-08-07 DIAGNOSIS — I480 Paroxysmal atrial fibrillation: Secondary | ICD-10-CM | POA: Diagnosis present

## 2022-08-07 DIAGNOSIS — N179 Acute kidney failure, unspecified: Secondary | ICD-10-CM | POA: Diagnosis present

## 2022-08-07 DIAGNOSIS — Z8249 Family history of ischemic heart disease and other diseases of the circulatory system: Secondary | ICD-10-CM

## 2022-08-07 DIAGNOSIS — M109 Gout, unspecified: Secondary | ICD-10-CM | POA: Diagnosis present

## 2022-08-07 DIAGNOSIS — I484 Atypical atrial flutter: Secondary | ICD-10-CM | POA: Diagnosis not present

## 2022-08-07 DIAGNOSIS — D6869 Other thrombophilia: Secondary | ICD-10-CM | POA: Diagnosis present

## 2022-08-07 DIAGNOSIS — Z9981 Dependence on supplemental oxygen: Secondary | ICD-10-CM

## 2022-08-07 DIAGNOSIS — N1832 Chronic kidney disease, stage 3b: Secondary | ICD-10-CM | POA: Diagnosis present

## 2022-08-07 DIAGNOSIS — Z95810 Presence of automatic (implantable) cardiac defibrillator: Secondary | ICD-10-CM | POA: Diagnosis present

## 2022-08-07 HISTORY — PX: TEE WITHOUT CARDIOVERSION: SHX5443

## 2022-08-07 HISTORY — PX: CARDIOVERSION: SHX1299

## 2022-08-07 LAB — CBC
HCT: 43.8 % (ref 39.0–52.0)
Hemoglobin: 14.5 g/dL (ref 13.0–17.0)
MCH: 31.9 pg (ref 26.0–34.0)
MCHC: 33.1 g/dL (ref 30.0–36.0)
MCV: 96.5 fL (ref 80.0–100.0)
Platelets: 142 10*3/uL — ABNORMAL LOW (ref 150–400)
RBC: 4.54 MIL/uL (ref 4.22–5.81)
RDW: 13.4 % (ref 11.5–15.5)
WBC: 9.2 10*3/uL (ref 4.0–10.5)
nRBC: 0 % (ref 0.0–0.2)

## 2022-08-07 LAB — BASIC METABOLIC PANEL
Anion gap: 14 (ref 5–15)
BUN: 46 mg/dL — ABNORMAL HIGH (ref 8–23)
CO2: 28 mmol/L (ref 22–32)
Calcium: 9.4 mg/dL (ref 8.9–10.3)
Chloride: 100 mmol/L (ref 98–111)
Creatinine, Ser: 2.32 mg/dL — ABNORMAL HIGH (ref 0.61–1.24)
GFR, Estimated: 28 mL/min — ABNORMAL LOW (ref >60)
Glucose, Bld: 128 mg/dL — ABNORMAL HIGH (ref 70–99)
Potassium: 4.3 mmol/L (ref 3.5–5.1)
Sodium: 142 mmol/L (ref 135–145)

## 2022-08-07 LAB — TROPONIN I (HIGH SENSITIVITY)
Troponin I (High Sensitivity): 18 ng/L — ABNORMAL HIGH (ref <18)
Troponin I (High Sensitivity): 18 ng/L — ABNORMAL HIGH (ref <18)

## 2022-08-07 LAB — PROTIME-INR
INR: 2.2 — ABNORMAL HIGH (ref 0.8–1.2)
Prothrombin Time: 24.1 seconds — ABNORMAL HIGH (ref 11.4–15.2)

## 2022-08-07 LAB — GLUCOSE, CAPILLARY
Glucose-Capillary: 85 mg/dL (ref 70–99)
Glucose-Capillary: 107 mg/dL — ABNORMAL HIGH (ref 70–99)

## 2022-08-07 LAB — BRAIN NATRIURETIC PEPTIDE: B Natriuretic Peptide: 224.3 pg/mL — ABNORMAL HIGH (ref 0.0–100.0)

## 2022-08-07 SURGERY — ECHOCARDIOGRAM, TRANSESOPHAGEAL
Anesthesia: Monitor Anesthesia Care

## 2022-08-07 MED ORDER — UMECLIDINIUM BROMIDE 62.5 MCG/ACT IN AEPB
1.0000 | INHALATION_SPRAY | Freq: Every day | RESPIRATORY_TRACT | Status: DC
  Administered 2022-08-08 – 2022-08-09 (×2): 1 via RESPIRATORY_TRACT
  Filled 2022-08-07: qty 7

## 2022-08-07 MED ORDER — PANTOPRAZOLE SODIUM 40 MG PO TBEC
40.0000 mg | DELAYED_RELEASE_TABLET | Freq: Every day | ORAL | Status: DC
  Administered 2022-08-07 – 2022-08-09 (×3): 40 mg via ORAL
  Filled 2022-08-07 (×3): qty 1

## 2022-08-07 MED ORDER — ONDANSETRON HCL 4 MG/2ML IJ SOLN: 4.0000 mg | Freq: Four times a day (QID) | INTRAMUSCULAR | Status: DC | PRN

## 2022-08-07 MED ORDER — PRAVASTATIN SODIUM 40 MG PO TABS
40.0000 mg | ORAL_TABLET | Freq: Every day | ORAL | Status: DC
  Administered 2022-08-07 – 2022-08-08 (×2): 40 mg via ORAL
  Filled 2022-08-07 (×2): qty 1

## 2022-08-07 MED ORDER — AMIODARONE HCL IN DEXTROSE 360-4.14 MG/200ML-% IV SOLN
30.0000 mg/h | INTRAVENOUS | Status: DC
  Administered 2022-08-08 – 2022-08-09 (×3): 30 mg/h via INTRAVENOUS
  Filled 2022-08-07 (×3): qty 200

## 2022-08-07 MED ORDER — PROPOFOL 10 MG/ML IV BOLUS
INTRAVENOUS | Status: DC | PRN
  Administered 2022-08-07: 10 mg via INTRAVENOUS

## 2022-08-07 MED ORDER — AMIODARONE LOAD VIA INFUSION
150.0000 mg | Freq: Once | INTRAVENOUS | Status: AC
  Administered 2022-08-07: 150 mg via INTRAVENOUS
  Filled 2022-08-07: qty 83.34

## 2022-08-07 MED ORDER — PROPOFOL 500 MG/50ML IV EMUL
INTRAVENOUS | Status: DC | PRN
  Administered 2022-08-07: 100 ug/kg/min via INTRAVENOUS

## 2022-08-07 MED ORDER — LIDOCAINE 2% (20 MG/ML) 5 ML SYRINGE
INTRAMUSCULAR | Status: DC | PRN
  Administered 2022-08-07: 40 mg via INTRAVENOUS

## 2022-08-07 MED ORDER — WARFARIN - PHARMACIST DOSING INPATIENT: Freq: Every day | Status: DC

## 2022-08-07 MED ORDER — SPIRONOLACTONE 12.5 MG HALF TABLET
12.5000 mg | ORAL_TABLET | Freq: Every day | ORAL | Status: DC
  Administered 2022-08-07 – 2022-08-09 (×3): 12.5 mg via ORAL
  Filled 2022-08-07 (×3): qty 1

## 2022-08-07 MED ORDER — WARFARIN SODIUM 2 MG PO TABS
3.0000 mg | ORAL_TABLET | Freq: Once | ORAL | Status: AC
  Administered 2022-08-07: 3 mg via ORAL
  Filled 2022-08-07: qty 1

## 2022-08-07 MED ORDER — PHENOBARBITAL 32.4 MG PO TABS
64.8000 mg | ORAL_TABLET | Freq: Two times a day (BID) | ORAL | Status: DC
  Administered 2022-08-07 – 2022-08-09 (×4): 64.8 mg via ORAL
  Filled 2022-08-07 (×4): qty 2

## 2022-08-07 MED ORDER — NITROGLYCERIN 0.4 MG SL SUBL: 0.4000 mg | SUBLINGUAL_TABLET | SUBLINGUAL | Status: DC | PRN

## 2022-08-07 MED ORDER — MOMETASONE FURO-FORMOTEROL FUM 200-5 MCG/ACT IN AERO
2.0000 | INHALATION_SPRAY | Freq: Two times a day (BID) | RESPIRATORY_TRACT | Status: DC
  Administered 2022-08-08 – 2022-08-09 (×2): 2 via RESPIRATORY_TRACT
  Filled 2022-08-07: qty 8.8

## 2022-08-07 MED ORDER — MELATONIN 5 MG PO TABS: 10.0000 mg | ORAL_TABLET | Freq: Every evening | ORAL | Status: DC | PRN

## 2022-08-07 MED ORDER — INSULIN ASPART 100 UNIT/ML IJ SOLN: 0.0000 [IU] | Freq: Every day | INTRAMUSCULAR | Status: DC

## 2022-08-07 MED ORDER — FUROSEMIDE 10 MG/ML IJ SOLN
80.0000 mg | Freq: Two times a day (BID) | INTRAMUSCULAR | Status: DC
  Administered 2022-08-07 – 2022-08-08 (×2): 80 mg via INTRAVENOUS
  Filled 2022-08-07 (×2): qty 8

## 2022-08-07 MED ORDER — CARVEDILOL 3.125 MG PO TABS
3.1250 mg | ORAL_TABLET | Freq: Two times a day (BID) | ORAL | Status: DC
  Administered 2022-08-07 – 2022-08-08 (×2): 3.125 mg via ORAL
  Filled 2022-08-07 (×2): qty 1

## 2022-08-07 MED ORDER — TIOTROPIUM BROMIDE MONOHYDRATE 2.5 MCG/ACT IN AERS: 2.0000 | INHALATION_SPRAY | Freq: Every evening | RESPIRATORY_TRACT | Status: DC

## 2022-08-07 MED ORDER — PHENYLEPHRINE 80 MCG/ML (10ML) SYRINGE FOR IV PUSH (FOR BLOOD PRESSURE SUPPORT)
PREFILLED_SYRINGE | INTRAVENOUS | Status: DC | PRN
  Administered 2022-08-07: 40 ug via INTRAVENOUS

## 2022-08-07 MED ORDER — ACETAMINOPHEN 325 MG PO TABS: 650.0000 mg | ORAL_TABLET | ORAL | Status: DC | PRN

## 2022-08-07 MED ORDER — ALBUTEROL SULFATE (2.5 MG/3ML) 0.083% IN NEBU: 2.5000 mg | INHALATION_SOLUTION | Freq: Four times a day (QID) | RESPIRATORY_TRACT | Status: DC | PRN

## 2022-08-07 MED ORDER — EMPAGLIFLOZIN 10 MG PO TABS
10.0000 mg | ORAL_TABLET | Freq: Every day | ORAL | Status: DC
  Administered 2022-08-07 – 2022-08-09 (×3): 10 mg via ORAL
  Filled 2022-08-07 (×3): qty 1

## 2022-08-07 MED ORDER — INSULIN ASPART 100 UNIT/ML IJ SOLN: 0.0000 [IU] | Freq: Three times a day (TID) | INTRAMUSCULAR | Status: DC

## 2022-08-07 MED ORDER — AMIODARONE HCL IN DEXTROSE 360-4.14 MG/200ML-% IV SOLN
60.0000 mg/h | INTRAVENOUS | Status: AC
  Administered 2022-08-07 (×2): 60 mg/h via INTRAVENOUS
  Filled 2022-08-07 (×2): qty 200

## 2022-08-07 MED ORDER — SODIUM CHLORIDE 0.9 % IV SOLN: INTRAVENOUS | Status: DC

## 2022-08-07 NOTE — H&P (Signed)
Cardiology Admission History and Physical   Patient ID: Ricky Hayden MRN: [id number]; DOB: 1945/06/14   Admission date: 08/07/2022  PCP:  Clinic, [facility] Providers Cardiologist:  Bryan Lemma, MD  AHF: Dr. Gala Romney EP: Dr. Graciela Husbands {    Chief Complaint:  palpitations, chest pressure  Patient Profile:   Ricky Hayden is a 77 y.o. 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  Device information Abbott CRT-P implanted 01/17/2020 Left bundle lead in LV port RV lead is programmed subthreshold   AAD hx Amiodarone started 04/2021, afib  History of Present Illness:   Mr. Lesniak was doing fairly well, just saw the HF team, 08/04/22, noting recent few episodes of ER visits 2/2 rapid AFlutter, TEE/DCCV Oct 2023,  once being paced terminated by EP service again last fall, and another with spontaneous conversion, he had seen dr. Graciela Husbands 3/24 not felt an ablation candidate, had of late been maintaining SR and his amiodarone dose reduced. He had slow steady weight gain, chronic L>R edema LE, had run out of Jardiance Class II-III symptoms, body habitus confusing exam/volume picture, though with edema, torsemide increased.  The same evening when checking his vitals as they normally do, his HR 120's or so, but BP ok. They have been monitoring his vitals noting his HR fairly erratic 60's-130s since Tuesday evening  He woke early this AM, he thinks by his dog, though felt heavy in his chest, a little SOB, and his son at bedside said his HR was fast. He relaxed for some time, HR remained elevated, BP up/down but ok, tooks his meds with some OJ/sips of Ensure about 0600-0610. With no improvement, ongoing pressure in his  chest and a few lower then usual BPs they brought him in.  Here he is in WCT 132bpm SBP generally 90's-100's O2 sats 95+ on RA  LABS K+ 4.3 BUN/Creat 46/2.32 (baseline is about 1.6-1.7 with recent increase in his torsemide) BNP 224 HS Trop 18 WBC 9.2 H/H 14/43 Plts 142  07/13/22: INR 1.8 08/04/22 INR 1.6 TODAY INR 2.2  He is comfortable, remains with some pressure in his chest, no acute/new SOB, he has COPD with some baseline SOB. 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 Newell Coral   Nocturia    Obesity    Pansinusitis    a.  complicated by brain  abscess and bleeding requiring craniotomy in 1995.   PONV (postoperative nausea and vomiting)    Presence of permanent cardiac pacemaker    Sinusitis    s/p ethmoidectomy and nasal septoplasty   Vertigo    intermitantly    Past Surgical History:  Procedure Laterality Date   BIV UPGRADE N/A 01/29/2020   Procedure: BIV UPGRADE;  Surgeon: Marinus Maw, MD;  Location: [facility] INVASIVE CV LAB;  Service: Cardiovascular;  Laterality: N/A;   CARDIOVERSION N/A 09/26/2021   Procedure: CARDIOVERSION;  Surgeon: Chilton Si, MD;  Location: [facility] ENDOSCOPY;  Service: Cardiovascular;  Laterality: N/A;   CARDIOVERSION N/A  02/03/2022   Procedure: CARDIOVERSION;  Surgeon: Wendall Stade, MD;  Location: [facility] ENDOSCOPY;  Service: Cardiovascular;  Laterality: N/A;   CIRCUMCISION N/A 11/20/2013   Procedure: CIRCUMCISION ADULT;  Surgeon: Garnett Farm, MD;  Location: [facility] ORS;  Service: Urology;  Laterality: N/A;   CRANIOTOMY  1995   hematomy due to sinus infection    CYSTOSCOPY/URETEROSCOPY/HOLMIUM LASER/STENT PLACEMENT Left 12/23/2021   Procedure: LEFT URETEROSCOPY/HOLMIUM LASER LITHOTRIPSY/STENT PLACEMENT retrograde;  Surgeon: Despina Arias, MD;  Location: [facility] ORS;  Service: Urology;  Laterality: Left;  90 MINUTES NEEDED FOR CASE   PACEMAKER IMPLANT N/A 01/17/2020   Procedure: PACEMAKER IMPLANT;  Surgeon: Duke Salvia, MD;  Location: [facility] INVASIVE CV LAB;  Service: Cardiovascular;  Laterality: N/A;   RIGHT/LEFT HEART CATH AND CORONARY ANGIOGRAPHY N/A 09/20/2019   Procedure: RIGHT/LEFT HEART CATH AND CORONARY ANGIOGRAPHY;  Surgeon: Dolores Patty, MD;  Location: [facility] INVASIVE CV LAB;  Service: Cardiovascular;  Laterality: N/A;   shoulder surg rt   1995   SINUS SURGERY WITH INSTATRAK     ethmoidectomy and nasal septum repair   TEE WITHOUT CARDIOVERSION N/A 09/26/2021   Procedure: TRANSESOPHAGEAL ECHOCARDIOGRAM (TEE);  Surgeon: Chilton Si, MD;  Location: [facility] ENDOSCOPY;  Service: Cardiovascular;  Laterality: N/A;   TEE WITHOUT CARDIOVERSION N/A 02/03/2022   Procedure: TRANSESOPHAGEAL ECHOCARDIOGRAM (TEE);  Surgeon: Wendall Stade, MD;  Location: [facility] ENDOSCOPY;  Service: Cardiovascular;  Laterality: N/A;     Medications Prior to Admission: Prior to Admission medications   Medication Sig Start Date End Date Taking? Authorizing Provider  acetaminophen (TYLENOL) 500 MG tablet Take 1,000 mg by mouth every 6 (six) hours as needed for moderate pain.    [provider]  albuterol (PROVENTIL HFA;VENTOLIN HFA) 108 (90 BASE) MCG/ACT inhaler Inhale 2 puffs into the lungs every 6 (six) hours as needed for wheezing.  12/10/14   Valentino Nose, MD  amiodarone (PACERONE) 200 MG tablet Take 1 tablet by mouth every morning and 1/2 tablet (100mg ) by mouth every evening. 07/13/22   Duke Salvia, MD  carvedilol (COREG) 6.25 MG tablet TAKE 1 TABLET BY MOUTH TWICE A DAY 04/21/22   Milford, Anderson Malta, FNP  empagliflozin (JARDIANCE) 25 MG TABS tablet One half tab daily (12.5mg ) using for HF and fluid. Please fill, if there are any problems please contact [phone] 08/04/22   Jacklynn Ganong, FNP  feeding supplement (ENSURE ENLIVE / ENSURE PLUS) LIQD Take 237 mLs by mouth 2 (two) times daily between meals. 02/05/22   Rolly Salter, MD  finasteride (PROSCAR) 5 MG tablet TAKE 1/2 TABLET BY MOUTH DAILY 11/28/21   Fanny Dance, MD  fluticasone-salmeterol [facility] INHUB) 250-50 MCG/ACT AEPB Inhale 1 puff into the lungs in the morning and at bedtime.    [provider]  Melatonin 10 MG CAPS Take 10 mg by mouth at bedtime as needed (  sleep).    [provider]  Multiple Vitamins-Minerals (PRESERVISION AREDS 2 PO) Take 1 tablet by mouth in the morning and at bedtime.    [provider]  pantoprazole (PROTONIX) 40 MG tablet Take 1 tablet (40 mg total) by mouth daily. 10/10/21   Love, Evlyn Kanner, PA-C  PHENobarbital (LUMINAL) 64.8 MG tablet Take 1 tablet (64.8 mg total) by mouth 2 (two) times daily. 07/24/15   Courtney Paris, MD  polyethylene glycol (MIRALAX / GLYCOLAX) 17 g packet Take 17 g by mouth 2 (two) times daily. Patient taking differently: Take 17 g by mouth daily. 10/10/21   Love, Evlyn Kanner, PA-C  pravastatin (PRAVACHOL) 40 MG tablet TAKE 1 TABLET BY MOUTH EVERY DAY IN THE EVENING 11/28/21   Fanny Dance, MD  sacubitril-valsartan (ENTRESTO) 24-26 MG Take 1 tablet by mouth 2 (two) times daily. For heart failure 03/12/22   Andrey Farmer, PA-C  Tiotropium Bromide Monohydrate (SPIRIVA RESPIMAT) 2.5 MCG/ACT AERS Inhale 2 each into the lungs every evening.    [provider]  torsemide  (DEMADEX) 20 MG tablet Take 2 tablets (40 mg total) by mouth every Monday, Wednesday, and Friday. 08/05/22   Milford, Anderson Malta, FNP  Vitamin D3 (VITAMIN D) 25 MCG tablet Take 1 tablet (1,000 Units total) by mouth daily. 10/10/21   Love, Evlyn Kanner, PA-C  warfarin (COUMADIN) 3 MG tablet TAKE 1 TABLET to 1 1/2 TABLETS BY MOUTH DAILY OR AS DIRECTED BY COUMADIN CLINIC 06/29/22   Marykay Lex, MD     Allergies:    Allergies  Allergen Reactions   Calcium Channel Blockers Other (See Comments)    Came to hospital in 1995-caused chest pain     Social History:   Social History   Socioeconomic History   Marital status: Widowed    Spouse name: Not on file   Number of children: Not on file   Years of education: Not on file   Highest education level: Not on file  Occupational History   Not on file  Tobacco Use   Smoking status: Former    Types: Cigarettes    Quit date: 05/04/1986    Years since quitting: 36.2   Smokeless tobacco: Never   Tobacco comments:    Former smoker 02/12/22  Vaping Use   Vaping Use: Never used  Substance and Sexual Activity   Alcohol use: Not Currently   Drug use: No   Sexual activity: Yes  Other Topics Concern   Not on file  Social History Narrative   Not on file   Social Determinants of Health   Financial Resource Strain: Not on file  Food Insecurity: No Food Insecurity (02/01/2022)   Hunger Vital Sign    Worried About Running Out of Food in the Last Year: Never true    Ran Out of Food in the Last Year: Never true  Transportation Needs: No Transportation Needs (02/01/2022)   PRAPARE - Administrator, Civil Service (Medical): No    Lack of Transportation (Non-Medical): No  Physical Activity: Not on file  Stress: Not on file  Social Connections: Not on file  Intimate Partner Violence: Not At Risk (02/01/2022)   Humiliation, Afraid, Rape, and Kick questionnaire    Fear of Current or Ex-Partner: No    Emotionally Abused: No    Physically  Abused: No    Sexually Abused: No    Family History:   The patient's family history includes Heart attack (age of onset: 40)  in his father; Heart failure (age of onset: 40) in his mother.    ROS:  Please see the history of present illness.  All other ROS reviewed and negative.     Physical Exam/Data:   Vitals:   08/07/22 1145 08/07/22 1200 08/07/22 1215 08/07/22 1230  BP: 108/75 101/78 94/79 103/75  Pulse: (!) 129 (!) 131 (!) 131 (!) 129  Resp:    (!) 24  Temp:      TempSrc:      SpO2: 94% 95% 94% 95%  Weight:      Height:       No intake or output data in the 24 hours ending 08/07/22 1305    08/07/2022    9:34 AM 08/04/2022   12:12 PM 07/13/2022   11:14 AM  Last 3 Weights  Weight (lbs) 242 lb 247 lb 12.8 oz 241 lb 3.2 oz  Weight (kg) 109.77 kg 112.401 kg 109.408 kg     Body mass index is 37.9 kg/m.  General:  Well nourished, well developed, in no acute distress, skin is warm/dry HEENT: normal Neck: no  JVD Vascular: No carotid bruits; Distal pulses 2+ bilaterally   Cardiac: irregular/irreg, tachycardic; no murmurs, gallops or rubs Lungs:  CTA /l, no wheezing, rhonchi or rales  Abd: soft, nontender, no hepatomegaly  Ext: trace-1+ RLE, 1-2+ LLE edema (chronically asymmetrical) Musculoskeletal:  No deformities Skin: warm and dry  Neuro:  no focal abnormalities noted Psych:  Normal affect    EKG:  The ECG that was done today was personally reviewed and demonstrates WCT 132bpm (known to be AFlutter)  Relevant CV Studies:  05/12/22: TTE 1. Endocardial border definition is poor even with the use of Definitiy  contrast.   2. Left ventricular ejection fraction, by estimation, is 30 to 35%. The  left ventricle has moderately decreased function. The left ventricle  demonstrates global hypokinesis. There is moderate concentric left  ventricular hypertrophy. Left ventricular  diastolic parameters are consistent with Grade I diastolic dysfunction  (impaired relaxation).   3.  Right ventricular systolic function is normal. The right ventricular  size is normal.   4. Right atrial size was moderately dilated.   5. The mitral valve is normal in structure. Trivial mitral valve  regurgitation. No evidence of mitral stenosis.   6. The aortic valve was not well visualized. Aortic valve regurgitation  is not visualized. No aortic stenosis is present.   7. Aortic dilatation noted. There is moderate dilatation of the aortic  root, measuring 43 mm. There is moderate dilatation of the ascending  aorta, measuring 44 mm.   8. The inferior vena cava is normal in size with greater than 50%  respiratory variability, suggesting right atrial pressure of 3 mmHg.   Comparison(s): The left ventricular function has improved.   02/03/22: TEE  1. CHF service contacted to see patient and transferred to [facility]  updated.   2. No LAA thrombus found DCC x 1 200 J converted from rapid fib.flutter  rate 122 to NSR with BiV pacing rate 84 bpm.   3. Patient dropped his sats mid 80's and BP 88 mmHg with propofol  Improved post procedure and with 100 ug/min neo Study was performed  expidesiously.   4. Left ventricular ejection fraction, by estimation, is 20 to 25%. The  left ventricle has normal function. The left ventricular internal cavity  size was severely dilated. Left ventricular diastolic function could not  be evaluated.   5. Bi V device leads  in RA/RV. Right ventricular systolic function is  severely reduced. The right ventricular size is severely enlarged.   6. Left atrial size was severely dilated. No left atrial/left atrial  appendage thrombus was detected.   7. Right atrial size was severely dilated.   8. The mitral valve is normal in structure. Mild mitral valve  regurgitation.   9. Tricuspid valve regurgitation is moderate.  10. The aortic valve is tricuspid. Aortic valve regurgitation is not  visualized. No aortic stenosis is present.          04/03/21: limited  echo Findings:          Pericardium:  No significant pericardial effusion          LV function:  Severely depressed (< 30% EF)          RV:  Normal          IVC collapsibility:  Indeterminate      May 2021: R/LHC Prox LAD to Mid LAD lesion is 25% stenosed. Prox Cx to Dist Cx lesion is 25% stenosed.  Findings:  Ao = 98/69 (81) LV = 101/24 RA = 11 RV = 59/15 PA = 64/23 (40) PCW = 19 Fick cardiac output/index = 5.6/2.6 PVR = 3.5 WU  FA sat = 94% PA sat = 64%, 70%  Assessment:  1. Minimal CAD 2. Nonischemic CM 3. Mild to moderately elevated filling pressures with normal cardiac output     2D Echo 4/21 1. Left ventricular ejection fraction, by estimation, is <20%. The left ventricle has severely decreased function. The left ventricle demonstrates global hypokinesis. The left ventricular internal cavity size was mildly dilated. There is mild concentric left ventricular hypertrophy. Left ventricular diastolic function could not be evaluated. Elevated left ventricular enddiastolic pressure. 2. Right ventricular systolic function is severely reduced. The right ventricular size is severely enlarged. There is moderately elevated pulmonary artery systolic pressure. 3. The mitral valve is normal in structure. Mild mitral valve regurgitation. No evidence of mitral stenosis. 4. The aortic valve is tricuspid. Aortic valve regurgitation is trivial. Mild to moderate aortic valve sclerosis/calcification is present, without any evidence of aortic stenosis. 5. Aortic dilatation noted. There is mild dilatation of the aortic root and of the ascending aorta measuring 41 mm and 45mm respectively. 6. The inferior vena cava is dilated in size with <50% respiratory variability, suggesting right atrial pressure of 15 mmHg. 7. Left atrial size was severely dilated. 8. Right atrial size was mildly dilated.  Laboratory Data:  High Sensitivity Troponin:   Recent Labs  Lab 08/07/22 0938   TROPONINIHS 18*      Chemistry Recent Labs  Lab 08/04/22 1250 08/07/22 0938  NA 143 142  K 5.3* 4.3  CL 104 100  CO2 31 28  GLUCOSE 87 128*  BUN 25* 46*  CREATININE 1.76* 2.32*  CALCIUM 9.8 9.4  GFRNONAA 39* 28*  ANIONGAP 8 14    No results for input(s): "PROT", "ALBUMIN", "AST", "ALT", "ALKPHOS", "BILITOT" in the last 168 hours. Lipids No results for input(s): "CHOL", "TRIG", "HDL", "LABVLDL", "LDLCALC", "CHOLHDL" in the last 168 hours. Hematology Recent Labs  Lab 08/07/22 0938  WBC 9.2  RBC 4.54  HGB 14.5  HCT 43.8  MCV 96.5  MCH 31.9  MCHC 33.1  RDW 13.4  PLT 142*   Thyroid No results for input(s): "TSH", "FREET4" in the last 168 hours. BNP Recent Labs  Lab 08/04/22 1250 08/07/22 1050  BNP 155.1* 224.3*    DDimer No results for  input(s): "DDIMER" in the last 168 hours.   Radiology/Studies:  DG Chest Port 1 View  Result Date: 08/07/2022 CLINICAL DATA:  77 year old male with chest pain and shortness of breath. EXAM: PORTABLE CHEST 1 VIEW COMPARISON:  Chest radiographs 03/06/2022 and earlier. FINDINGS: Portable AP upright view at 1014 hours. Stable cardiomegaly, mediastinal contours, left chest cardiac pacemaker. Similar lordotic positioning. Allowing for portable technique the lungs are clear. No pneumothorax or pleural effusion. Visualized tracheal air column is within normal limits. Paucity of bowel gas in the visible abdomen. No acute osseous abnormality identified. IMPRESSION: Stable cardiomegaly. No acute cardiopulmonary abnormality. Electronically Signed   By: Odessa FlemingH  Hall M.D.   On: 08/07/2022 10:24     Assessment and Plan:   AFlutter w/RVR CHA2DS2Vasc is 5, on warfarin (not OAC 2/2 interaction with his phnobarbitol By his device, current episode ongoing just about 9 hours though also note that he has had multiple episodes today declared terminated that were not Noting some undersensing of his Afib intermittently  His HR here in the ER pretty steadily  130's-140's though has moments of slowing His HRs have been erratic at home for a few days Given in subtherapeutic INR in 4/2, not comfortable try to pace terminate him in the ER Will plan to purse TEE/DCCV   NICM Chronic CHF (biventricular failur) Last echo noted preserved RV function, LVEF global hypokinesis w/normal RV size and function.   4. CP He had cath in May 2021 with non-obstructive disease Likely is 2/2 his RVR  5. COPD Home regime  6. DM Not on meds at home follow   Risk Assessment/Risk Scores:    For questions or updates, please contact [facility] Please consult [email or web address] for contact info under     Signed, Sheilah PigeonRenee Lynn Sharronda Schweers, PA-C  08/07/2022 1:05 PM

## 2022-08-07 NOTE — Progress Notes (Addendum)
Pt's O2 sat dropping to as low as upper 70's while sleeping. [place] O2 @ 2L applied

## 2022-08-07 NOTE — Progress Notes (Signed)
  Echocardiogram Echocardiogram Transesophageal has been performed.  Delcie Roch 08/07/2022, 3:12 PM

## 2022-08-07 NOTE — ED Notes (Signed)
ED TO INPATIENT HANDOFF REPORT  ED Nurse Name and Phone #: Beatris Ship RN [phone]  S Name/Age/Gender Ricky Hayden 77 y.o. male Room/Bed: [facility]/NONE  Code Status   Code Status: Full Code  Home/SNF/Other Home Patient oriented to: self, place, time, and situation Is this baseline? Yes   Triage Complete: Triage complete  Chief Complaint Atrial flutter [I48.92] Palpitations [R00.2] Precordial pain [R07.2]  Triage Note Pt. Stated, I started having heart pain this morning. I have COPD  but have some pain. I do have SOB more than usual. Son stated, His heart rate has been more on the high side in the last 3-4 days.   Allergies Allergies  Allergen Reactions   Calcium Channel Blockers Other (See Comments)    Came to hospital in 1995-caused chest pain     Level of Care/Admitting Diagnosis ED Disposition     ED Disposition  Admit   Condition  --   Comment  Hospital Area: [facility] [[id number]]  Level of Care: Telemetry Cardiac [[id number]]  May admit patient to [facility] or [facility] if equivalent level of care is available:: No  Covid Evaluation: Asymptomatic - no recent exposure (last 10 days) testing not required  Diagnosis: Atrial flutter [427.32.ICD-9-CM]  Admitting Physician: Lanier Prude [[id number]]  Attending Physician: Lanier Prude [id number]  Certification:: I certify this patient will need inpatient services for at least 2 midnights  Estimated Length of Stay: 3          B Medical/Surgery History Past Medical History:  Diagnosis Date   Arthritis    osteoarthritis of left knee   Ascending aorta dilatation    Balanitis    recurrent   Cardiac arrhythmia    life threatening, secondary to CCB vs b- blockers   Cardiomyopathy    Chronic joint pain    Chronic systolic CHF (congestive heart failure)    CKD (chronic kidney disease), stage III    COPD (chronic obstructive pulmonary disease)    Coronary artery disease    Dilated aortic root     Dyspnea    Enlarged prostate    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 Newell Coral   Nocturia    Obesity    Pansinusitis    a.  complicated by brain abscess and bleeding requiring craniotomy in 1995.   PONV (postoperative nausea and vomiting)    Presence of permanent cardiac pacemaker    Sinusitis    s/p ethmoidectomy and nasal septoplasty   Vertigo    intermitantly   Past Surgical History:  Procedure Laterality Date   BIV UPGRADE N/A 01/29/2020   Procedure: BIV UPGRADE;  Surgeon: Marinus Maw, MD;  Location: [facility] INVASIVE CV LAB;  Service: Cardiovascular;  Laterality: N/A;   CARDIOVERSION N/A 09/26/2021   Procedure: CARDIOVERSION;  Surgeon: Chilton Si, MD;  Location: [facility] ENDOSCOPY;  Service: Cardiovascular;  Laterality: N/A;   CARDIOVERSION N/A 02/03/2022   Procedure: CARDIOVERSION;  Surgeon: Wendall Stade, MD;  Location: [facility] ENDOSCOPY;  Service: Cardiovascular;  Laterality: N/A;   CIRCUMCISION N/A 11/20/2013   Procedure: CIRCUMCISION ADULT;  Surgeon: Garnett Farm, MD;  Location: [facility] ORS;  Service: Urology;  Laterality: N/A;   CRANIOTOMY  1995   hematomy  due to sinus infection    CYSTOSCOPY/URETEROSCOPY/HOLMIUM LASER/STENT PLACEMENT Left 12/23/2021   Procedure: LEFT URETEROSCOPY/HOLMIUM LASER LITHOTRIPSY/STENT PLACEMENT retrograde;  Surgeon: Despina Arias, MD;  Location: [facility] ORS;  Service: Urology;  Laterality: Left;  90 MINUTES NEEDED FOR CASE   PACEMAKER IMPLANT N/A 01/17/2020   Procedure: PACEMAKER IMPLANT;  Surgeon: Duke Salvia, MD;  Location: [facility] INVASIVE CV LAB;  Service: Cardiovascular;  Laterality: N/A;   RIGHT/LEFT HEART CATH AND CORONARY ANGIOGRAPHY N/A 09/20/2019   Procedure: RIGHT/LEFT HEART CATH AND CORONARY ANGIOGRAPHY;  Surgeon: Dolores Patty, MD;  Location: [facility] INVASIVE CV LAB;  Service: Cardiovascular;  Laterality: N/A;   shoulder surg rt   1995   SINUS SURGERY WITH INSTATRAK     ethmoidectomy and nasal septum repair   TEE WITHOUT CARDIOVERSION N/A 09/26/2021   Procedure: TRANSESOPHAGEAL ECHOCARDIOGRAM (TEE);  Surgeon: Chilton Si, MD;  Location: [facility] ENDOSCOPY;  Service: Cardiovascular;  Laterality: N/A;   TEE WITHOUT CARDIOVERSION N/A 02/03/2022   Procedure: TRANSESOPHAGEAL ECHOCARDIOGRAM (TEE);  Surgeon: Wendall Stade, MD;  Location: [facility] ENDOSCOPY;  Service: Cardiovascular;  Laterality: N/A;     A IV Location/Drains/Wounds Patient Lines/Drains/Airways Status     Active Line/Drains/Airways     Name Placement date Placement time Site Days   Peripheral IV 08/07/22 20 G Anterior;Right Forearm 08/07/22  1058  Forearm  less than 1   Ureteral Drain/Stent Left ureter 6 Fr. 12/23/21  1029  Left ureter  227            Intake/Output Last 24 hours  Intake/Output Summary (Last 24 hours) at 08/07/2022 1508 Last data filed at 08/07/2022 1446 Gross per 24 hour  Intake 400 ml  Output --  Net 400 ml    Labs/Imaging Results for orders placed or performed during the hospital encounter of 08/07/22 (from the past 48 hour(s))  Basic metabolic panel     Status: Abnormal   Collection Time: 08/07/22  9:38 AM  Result Value Ref Range   Sodium 142 135 - 145 mmol/L   Potassium 4.3 3.5 - 5.1 mmol/L   Chloride 100 98 - 111 mmol/L   CO2 28 22 - 32 mmol/L   Glucose, Bld 128 (H) 70 - 99 mg/dL    Comment: Glucose reference range applies only to samples taken after fasting for at least 8 hours.   BUN 46 (H) 8 - 23 mg/dL   Creatinine, Ser 6.24 (H) 0.61 - 1.24 mg/dL   Calcium 9.4 8.9 - 46.9 mg/dL   GFR, Estimated 28 (L) >60 mL/min    Comment: (NOTE) Calculated using the CKD-EPI Creatinine Equation (2021)    Anion gap 14 5 - 15    Comment: Performed at [facility] Lab, [address] [place], [place] [address]  CBC     Status:  Abnormal   Collection Time: 08/07/22  9:38 AM  Result Value Ref Range   WBC 9.2 4.0 - 10.5 K/uL   RBC 4.54 4.22 - 5.81 MIL/uL   Hemoglobin 14.5 13.0 - 17.0 g/dL   HCT 57.5 05.1 - 83.3 %   MCV 96.5 80.0 - 100.0 fL   MCH 31.9 26.0 - 34.0 pg   MCHC 33.1 30.0 - 36.0 g/dL   RDW 58.2 51.8 - 98.4 %   Platelets 142 (L) 150 - 400 K/uL   nRBC 0.0 0.0 - 0.2 %    Comment: Performed at [facility], [address] [place], [place] [address]  Troponin I (High Sensitivity)  Status: Abnormal   Collection Time: 08/07/22  9:38 AM  Result Value Ref Range   Troponin I (High Sensitivity) 18 (H) <18 ng/L    Comment: (NOTE) Elevated high sensitivity troponin I (hsTnI) values and significant  changes across serial measurements may suggest ACS but many other  chronic and acute conditions are known to elevate hsTnI results.  Refer to the "Links" section for chest pain algorithms and additional  guidance. Performed at [facility] Lab, [address] [place], [place] [address]   Protime-INR     Status: Abnormal   Collection Time: 08/07/22 10:13 AM  Result Value Ref Range   Prothrombin Time 24.1 (H) 11.4 - 15.2 seconds   INR 2.2 (H) 0.8 - 1.2    Comment: (NOTE) INR goal varies based on device and disease states. Performed at [facility], [address] [place], [place] [address]   Brain natriuretic peptide     Status: Abnormal   Collection Time: 08/07/22 10:50 AM  Result Value Ref Range   B Natriuretic Peptide 224.3 (H) 0.0 - 100.0 pg/mL    Comment: Performed at [facility], [address] [place], [place] [address]  Troponin I (High Sensitivity)     Status: Abnormal   Collection Time: 08/07/22 11:38 AM  Result Value Ref Range   Troponin I (High Sensitivity) 18 (H) <18 ng/L    Comment: (NOTE) Elevated high sensitivity troponin I (hsTnI) values and significant  changes across serial measurements may suggest ACS but many other  chronic and acute conditions are known to  elevate hsTnI results.  Refer to the "Links" section for chest pain algorithms and additional  guidance. Performed at [facility] Lab, [address] [place], [place] [address]    DG Chest Port 1 View  Result Date: 08/07/2022 CLINICAL DATA:  77 year old male with chest pain and shortness of breath. EXAM: PORTABLE CHEST 1 VIEW COMPARISON:  Chest radiographs 03/06/2022 and earlier. FINDINGS: Portable AP upright view at 1014 hours. Stable cardiomegaly, mediastinal contours, left chest cardiac pacemaker. Similar lordotic positioning. Allowing for portable technique the lungs are clear. No pneumothorax or pleural effusion. Visualized tracheal air column is within normal limits. Paucity of bowel gas in the visible abdomen. No acute osseous abnormality identified. IMPRESSION: Stable cardiomegaly. No acute cardiopulmonary abnormality. Electronically Signed   By: Odessa Fleming M.D.   On: 08/07/2022 10:24    Pending Labs Unresulted Labs (From admission, onward)     Start     Ordered   08/09/22 0500  Basic metabolic panel  Daily,   R      08/07/22 1338   08/08/22 0500  Comprehensive metabolic panel  Tomorrow morning,   R        08/07/22 1338            Vitals/Pain Today's Vitals   08/07/22 1300 08/07/22 1348 08/07/22 1451 08/07/22 1500  BP: 94/68 111/89 99/77 115/76  Pulse: (!) 131 (!) 133 75 78  Resp:  (!) 24 (!) 24 (!) 21  Temp:  (!) 97.4 F (36.3 C) 97.6 F (36.4 C)   TempSrc:  Tympanic Temporal   SpO2: 95% 95% 99% 94%  Weight:      Height:      PainSc:  0-No pain 0-No pain     Isolation Precautions No active isolations  Medications Medications  0.9 %  sodium chloride infusion (0 mLs Intravenous Stopped 08/07/22 1455)  nitroGLYCERIN (NITROSTAT) SL tablet 0.4 mg ( Sublingual  MAR Hold 08/07/22 1330)  acetaminophen (TYLENOL) tablet 650 mg ( Oral MAR Hold 08/07/22 1330)  ondansetron (ZOFRAN) injection 4 mg ( Intravenous MAR Hold 08/07/22 1330)    Mobility walks     Focused  Assessments Cardiac Assessment Handoff:  Cardiac Rhythm: Sinus tachycardia Lab Results  Component Value Date   CKTOTAL 39 (L) 10/27/2021   CKMB 0.9 02/07/2008   TROPONINI 0.02        NO INDICATION OF MYOCARDIAL INJURY. 02/07/2008   Lab Results  Component Value Date   DDIMER 0.55 (H) 08/17/2019   Does the Patient currently have chest pain? No    R Recommendations: See Admitting Provider Note  Report given to: 6E05

## 2022-08-07 NOTE — ED Provider Notes (Signed)
[facility] [place] [facility]   CSN: [id number] Arrival date & time: 08/07/22  [id number]     History  Chief Complaint  Patient presents with   Shortness of Breath   Chest Pain    Ricky Hayden is a 77 y.o. male.  There were patient followed by cardiology for history of atrial fibrillation sometimes atrial flutter.  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  Patient has a pacemaker in place [employer].  Also history of chronic kidney disease COPD diabetes hyperlipidemia hypertension patient is on Coumadin.  And patient is on amiodarone.  Patient last seen by cardiology April 2.  Patient had some modifications to his medications at that time as Demadex was increased.  To 40 mg every Monday Wednesday and Friday.  And they started him back on Jardiance.  Did take his medications this morning.  Patient states that starting on Wednesday started to get rapid heartbeat.  At about 2 in the morning he of this morning he developed chest pain.  Chest discomfort has not had anything like that before.  The rapid heart rate has been up and down some since then.  Chart review shows that patient was seen in November and October for similar presentation and cardiology was involved and they made adjustments on his pacemaker which corrected the problem.  Based on this we will discuss with cardiology.  Patient vital signs blood pressure low 100s in the 90s systolic heart rate ranging anywhere from 100 up to 130s.  Oxygen saturation good.       Home Medications Prior to Admission medications   Medication Sig Start Date End Date Taking? Authorizing Provider  acetaminophen (TYLENOL) 500 MG tablet Take 1,000 mg by mouth every 6 (six) hours as needed for moderate pain.    [provider]  albuterol (PROVENTIL HFA;VENTOLIN HFA) 108 (90 BASE) MCG/ACT inhaler Inhale 2 puffs into the lungs every 6 (six) hours as needed for wheezing. 12/10/14    Valentino NoseBoswell, Nathan, MD  amiodarone (PACERONE) 200 MG tablet Take 1 tablet by mouth every morning and 1/2 tablet (100mg ) by mouth every evening. 07/13/22   Duke SalviaKlein, Steven C, MD  carvedilol (COREG) 6.25 MG tablet TAKE 1 TABLET BY MOUTH TWICE A DAY 04/21/22   Milford, Anderson MaltaJessica M, FNP  empagliflozin (JARDIANCE) 25 MG TABS tablet One half tab daily (12.5mg ) using for HF and fluid. Please fill, if there are any problems please contact [phone] 08/04/22   Jacklynn GanongMilford, Jessica M, FNP  feeding supplement (ENSURE ENLIVE / ENSURE PLUS) LIQD Take 237 mLs by mouth 2 (two) times daily between meals. 02/05/22   Rolly SalterPatel, Pranav M, MD  finasteride (PROSCAR) 5 MG tablet TAKE 1/2 TABLET BY MOUTH DAILY 11/28/21   Fanny DanceShtridelman, Yuri, MD  fluticasone-salmeterol [facility](WIXELA INHUB) 250-50 MCG/ACT AEPB Inhale 1 puff into the lungs in the morning and at bedtime.    [provider]  Melatonin 10 MG CAPS Take 10 mg by mouth at bedtime as needed (sleep).    [provider]  Multiple Vitamins-Minerals (PRESERVISION AREDS 2 PO) Take 1 tablet by mouth in the morning and at bedtime.    [provider]  pantoprazole (PROTONIX) 40 MG tablet Take 1 tablet (40 mg total) by mouth daily. 10/10/21   Love, Evlyn KannerPamela S, PA-C  PHENobarbital (LUMINAL) 64.8 MG tablet Take 1 tablet (64.8 mg total) by mouth 2 (two) times daily. 07/24/15   Courtney ParisJones, Eden W, MD  polyethylene glycol [facility](MIRALAX / Ethelene HalGLYCOLAX) 17  g packet Take 17 g by mouth 2 (two) times daily. Patient taking differently: Take 17 g by mouth daily. 10/10/21   Love, Evlyn Kanner, PA-C  pravastatin (PRAVACHOL) 40 MG tablet TAKE 1 TABLET BY MOUTH EVERY DAY IN THE EVENING 11/28/21   Fanny Dance, MD  sacubitril-valsartan (ENTRESTO) 24-26 MG Take 1 tablet by mouth 2 (two) times daily. For heart failure 03/12/22   Andrey Farmer, PA-C  Tiotropium Bromide Monohydrate (SPIRIVA RESPIMAT) 2.5 MCG/ACT AERS Inhale 2 each into the lungs every evening.    [provider]  torsemide  (DEMADEX) 20 MG tablet Take 2 tablets (40 mg total) by mouth every Monday, Wednesday, and Friday. 08/05/22   Milford, Anderson Malta, FNP  Vitamin D3 (VITAMIN D) 25 MCG tablet Take 1 tablet (1,000 Units total) by mouth daily. 10/10/21   Love, Evlyn Kanner, PA-C  warfarin (COUMADIN) 3 MG tablet TAKE 1 TABLET to 1 1/2 TABLETS BY MOUTH DAILY OR AS DIRECTED BY COUMADIN CLINIC 06/29/22   Marykay Lex, MD      Allergies    Calcium channel blockers    Review of Systems   Review of Systems  Constitutional:  Negative for chills and fever.  HENT:  Negative for ear pain and sore throat.   Eyes:  Negative for pain and visual disturbance.  Respiratory:  Negative for cough and shortness of breath.   Cardiovascular:  Positive for chest pain and palpitations.  Gastrointestinal:  Negative for abdominal pain and vomiting.  Genitourinary:  Negative for dysuria and hematuria.  Musculoskeletal:  Negative for arthralgias and back pain.  Skin:  Negative for color change and rash.  Neurological:  Negative for seizures and syncope.  All other systems reviewed and are negative.   Physical Exam Updated Vital Signs BP 103/75   Pulse (!) 129   Temp 98.4 F (36.9 C) (Oral)   Resp (!) 24   Ht 1.702 m (5\' 7" )   Wt 109.8 kg   SpO2 95%   BMI 37.90 kg/m  Physical Exam Vitals and nursing note reviewed.  Constitutional:      General: He is not in acute distress.    Appearance: Normal appearance. He is well-developed.  HENT:     Head: Normocephalic and atraumatic.     Mouth/Throat:     Mouth: Mucous membranes are moist.  Eyes:     Extraocular Movements: Extraocular movements intact.     Conjunctiva/sclera: Conjunctivae normal.     Pupils: Pupils are equal, round, and reactive to light.  Cardiovascular:     Rate and Rhythm: Tachycardia present. Rhythm irregular.     Heart sounds: No murmur heard. Pulmonary:     Effort: Pulmonary effort is normal. No respiratory distress.     Breath sounds: Normal breath sounds.  No wheezing, rhonchi or rales.  Abdominal:     Palpations: Abdomen is soft.     Tenderness: There is no abdominal tenderness.  Musculoskeletal:        General: Swelling present.     Cervical back: Normal range of motion and neck supple. No rigidity.     Right lower leg: Edema present.     Left lower leg: Edema present.  Skin:    General: Skin is warm and dry.     Capillary Refill: Capillary refill takes less than 2 seconds.  Neurological:     General: No focal deficit present.     Mental Status: He is alert and oriented to person, place, and time.  Psychiatric:  Mood and Affect: Mood normal.     ED Results / Procedures / Treatments   Labs (all labs ordered are listed, but only abnormal results are displayed) Labs Reviewed  BASIC METABOLIC PANEL - Abnormal; Notable for the following components:      Result Value   Glucose, Bld 128 (*)    BUN 46 (*)    Creatinine, Ser 2.32 (*)    GFR, Estimated 28 (*)    All other components within normal limits  CBC - Abnormal; Notable for the following components:   Platelets 142 (*)    All other components within normal limits  PROTIME-INR - Abnormal; Notable for the following components:   Prothrombin Time 24.1 (*)    INR 2.2 (*)    All other components within normal limits  BRAIN NATRIURETIC PEPTIDE - Abnormal; Notable for the following components:   B Natriuretic Peptide 224.3 (*)    All other components within normal limits  TROPONIN I (HIGH SENSITIVITY) - Abnormal; Notable for the following components:   Troponin I (High Sensitivity) 18 (*)    All other components within normal limits  TROPONIN I (HIGH SENSITIVITY)    EKG EKG Interpretation  Date/Time:  Friday August 07 2022 09:20:30 EDT Ventricular Rate:  132 PR Interval:  128 QRS Duration: 174 QT Interval:  362 QTC Calculation: 536 R Axis:   -68 Text Interpretation: Sinus tachycardia Left axis deviation Left bundle branch block Abnormal ECG When compared with ECG  of 06-Mar-2022 11:23, PREVIOUS ECG IS PRESENT Confirmed by Vanetta Mulders [phone]) on 08/07/2022 9:48:19 AM  Radiology DG Chest Port 1 View  Result Date: 08/07/2022 CLINICAL DATA:  77 year old male with chest pain and shortness of breath. EXAM: PORTABLE CHEST 1 VIEW COMPARISON:  Chest radiographs 03/06/2022 and earlier. FINDINGS: Portable AP upright view at 1014 hours. Stable cardiomegaly, mediastinal contours, left chest cardiac pacemaker. Similar lordotic positioning. Allowing for portable technique the lungs are clear. No pneumothorax or pleural effusion. Visualized tracheal air column is within normal limits. Paucity of bowel gas in the visible abdomen. No acute osseous abnormality identified. IMPRESSION: Stable cardiomegaly. No acute cardiopulmonary abnormality. Electronically Signed   By: Odessa Fleming M.D.   On: 08/07/2022 10:24    Procedures Procedures    Medications Ordered in ED Medications - No data to display  ED Course/ Medical Decision Making/ A&P                             Medical Decision Making Amount and/or Complexity of Data Reviewed Labs: ordered. Radiology: ordered.   Requested interrogation of his pacemaker.  Chest x-ray without evidence of acute pulmonary edema.  INR good at 2.2.  BNP 224 up a little bit for him.  CBC white count 9.2 hemoglobin 14.4 platelets 142.  Initial troponin 18 delta troponin pending.  Basic metabolic panel significant for creatinine of 2.32 and a BUN of 46 higher than normal he is creatinines being in the 1 range.  So some acute kidney injury.  GFR was 28.  Rate control as per cardiology.  But with the acute kidney injury probably secondary to the palpitations and the increase in his Demadex may require admission.  Patient currently stable.  So not forced to cardiovert.  And he has not been cardioverted recently.  To control his rhythms.  Contacted cardiology early on to see him.  They are in the process of seeing him.  Recent blood pressure  was 103/75.  CRITICAL CARE Performed by: Vanetta Mulders Total critical care time: 35 minutes Critical care time was exclusive of separately billable procedures and treating other patients. Critical care was necessary to treat or prevent imminent or life-threatening deterioration. Critical care was time spent personally by me on the following activities: development of treatment plan with patient and/or surrogate as well as nursing, discussions with consultants, evaluation of patient's response to treatment, examination of patient, obtaining history from patient or surrogate, ordering and performing treatments and interventions, ordering and review of laboratory studies, ordering and review of radiographic studies, pulse oximetry and re-evaluation of patient's condition.  Final Clinical Impression(s) / ED Diagnoses Final diagnoses:  Palpitations  Precordial pain    Rx / DC Orders ED Discharge Orders     None         Vanetta Mulders, MD 08/07/22 1246

## 2022-08-07 NOTE — Interval H&P Note (Signed)
History and Physical Interval Note:  08/07/2022 2:32 PM  Ricky Hayden  has presented today for surgery, with the diagnosis of afib.  The various methods of treatment have been discussed with the patient and family. After consideration of risks, benefits and other options for treatment, the patient has consented to  Procedure(s): TRANSESOPHAGEAL ECHOCARDIOGRAM (TEE) (N/A) CARDIOVERSION (N/A) as a surgical intervention.  The patient's history has been reviewed, patient examined, no change in status, stable for surgery.  I have reviewed the patient's chart and labs.  Questions were answered to the patient's satisfaction.     Hagop Mccollam [employer]

## 2022-08-07 NOTE — Anesthesia Postprocedure Evaluation (Signed)
Anesthesia Post Note  Patient: [name]  Procedure(s) Performed: TRANSESOPHAGEAL ECHOCARDIOGRAM (TEE) CARDIOVERSION     Patient location during evaluation: Endoscopy Anesthesia Type: MAC Level of consciousness: awake Pain management: pain level controlled Vital Signs Assessment: post-procedure vital signs reviewed and stable Respiratory status: spontaneous breathing, nonlabored ventilation and respiratory function stable Cardiovascular status: blood pressure returned to baseline and stable Postop Assessment: no apparent nausea or vomiting Anesthetic complications: no   No notable events documented.  Last Vitals:  Vitals:   08/07/22 1510 08/07/22 1520  BP: 118/76 109/74  Pulse: 78 79  Resp: (!) 21 17  Temp:    SpO2: 94% 96%    Last Pain:  Vitals:   08/07/22 1520  TempSrc:   PainSc: 0-No pain                 Yamileth Hayse P Delanna Blacketer

## 2022-08-07 NOTE — Progress Notes (Signed)
ANTICOAGULATION CONSULT NOTE - Initial Consult  Pharmacy Consult for Warfarin Indication: atrial fibrillation  Allergies  Allergen Reactions   Calcium Channel Blockers Other (See Comments)    Came to hospital in 1995-caused chest pain     Patient Measurements: Height: 5\' 7"  (170.2 cm) Weight: 109.8 kg (242 lb) IBW/kg (Calculated) : 66.1   Vital Signs: Temp: 97.6 F (36.4 C) (04/05 1451) Temp Source: Temporal (04/05 1451) BP: 109/74 (04/05 1520) Pulse Rate: 79 (04/05 1520)  Labs: Recent Labs    08/07/22 0938 08/07/22 1013 08/07/22 1138  HGB 14.5  --   --   HCT 43.8  --   --   PLT 142*  --   --   LABPROT  --  24.1*  --   INR  --  2.2*  --   CREATININE 2.32*  --   --   TROPONINIHS 18*  --  18*    Estimated Creatinine Clearance: 31 mL/min (A) (by C-G formula based on SCr of 2.32 mg/dL (H)).   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 Newell Coral   Nocturia    Obesity    Pansinusitis    a.  complicated by brain abscess and bleeding requiring craniotomy in 1995.   PONV (postoperative nausea and vomiting)    Presence of permanent cardiac pacemaker    Sinusitis    s/p ethmoidectomy and nasal septoplasty   Vertigo    intermitantly    Assessment: 55 YOM  admitted for cardioversion for atrial fibrillation, on warfarin prior to admission. Pharmacy consulted to dose warfarin.   Home regimen is 4.5mg  qSunday and 3mg  all other days.   INR 2.2 this morning. CBC within normal limits.  Goal of Therapy:  INR 2-3 Monitor platelets by anticoagulation protocol: Yes   Plan:  Warfarin 3mg  PO once Daily INR and CBC Monitor for signs/symptoms of bleeding  Ellis Savage, PharmD Clinical Pharmacist 08/07/2022,5:02 PM

## 2022-08-07 NOTE — Consult Note (Addendum)
Advanced Heart Failure Team Consult Note   Primary Physician: [facility] PCP-Cardiologist:  Bryan Lemma, MD  Reason for Consultation: Heart Failure   HPI:    Ricky Hayden is seen today for evaluation of  heart failure at the request of Dr Lalla Brothers.   Ricky Stup is a 77 year old with a history of  chronic biventricular CHF, LBBB s/p CRT-P in 2021, atrial fibrillation, CKD, COPD, DM, HLD, HTN, pansinusitis complicated by brain abscess and bleeding requiring craniotomy in 1995.   Most recent LHC 2021    Prox LAD to Mid LAD lesion is 25% stenosed. Prox Cx to Dist Cx lesion is 25% stenosed.   Had TEE [facility] Oct 2023 and was pace terminated by EP. EF 20-25%. Saw EP was not a candidate for ablation.   Had Echo 05/2022 - EF 30-35%, global hk, RV normal. Grade I DD.   Followed in  the ED and was last seen 08/04/22. Volume overloaded at that time. Jardiance was restarted and torsemide was increased to 40 mg daily. After his visit he noticed his heart rate was 60-130s.   Presented to the ED with chest pain and palpitations.  EKG with WCT 132 BPM. CXR with no abnormality. Pertinent labs included K 4.3, Creatinine 2.3, HS Trop 18, WBC 9.2 . Plan for TEE/ DC_CV    Review of Systems: [y] = yes, [ ]  = no   General: Weight gain [ ] ; Weight loss [ ] ; Anorexia [ ] ; Fatigue [ ] ; Fever [ ] ; Chills [ ] ; Weakness [ ]   Cardiac: Chest pain/pressure [ ] ; Resting SOB [ ] ; Exertional SOB [ ] ; Orthopnea [ ] ; Pedal Edema [ ] ; Palpitations [ ] ; Syncope [ ] ; Presyncope [ ] ; Paroxysmal nocturnal dyspnea[ ]   Pulmonary: Cough [ ] ; Wheezing[ ] ; Hemoptysis[ ] ; Sputum [ ] ; Snoring [ ]   GI: Vomiting[ ] ; Dysphagia[ ] ; Melena[ ] ; Hematochezia [ ] ; Heartburn[ ] ; Abdominal pain [ ] ; Constipation [ ] ; Diarrhea [ ] ; BRBPR [ ]   GU: Hematuria[ ] ; Dysuria [ ] ; Nocturia[ ]   Vascular: Pain in legs with walking [ ] ; Pain in feet with lying flat [ ] ; Non-healing sores [ ] ; Stroke [ ] ; TIA [ ] ; Slurred speech [ ] ;   Neuro: Headaches[ ] ; Vertigo[ ] ; Seizures[ ] ; Paresthesias[ ] ;Blurred vision [ ] ; Diplopia [ ] ; Vision changes [ ]   Ortho/Skin: Arthritis [ ] ; Joint pain [ ] ; Muscle pain [ ] ; Joint swelling [ ] ; Back Pain [ ] ; Rash [ ]   Psych: Depression[ ] ; Anxiety[ ]   Heme: Bleeding problems [ ] ; Clotting disorders [ ] ; Anemia [ ]   Endocrine: Diabetes [ ] ; Thyroid dysfunction[ ]   Home Medications Prior to Admission medications   Medication Sig Start Date End Date Taking? Authorizing Provider  albuterol (PROVENTIL HFA;VENTOLIN HFA) 108 (90 BASE) MCG/ACT inhaler Inhale 2 puffs into the lungs every 6 (six) hours as needed for wheezing. 12/10/14  Yes Valentino Nose, MD  amiodarone (PACERONE) 200 MG tablet Take 1 tablet by mouth every morning and 1/2 tablet (100mg ) by mouth every evening. 07/13/22  Yes Duke Salvia, MD  carvedilol (COREG) 6.25 MG tablet TAKE 1 TABLET BY MOUTH TWICE A DAY 04/21/22  Yes Milford, Jessica M, FNP  empagliflozin (JARDIANCE) 25 MG TABS tablet One half tab daily (12.5mg ) using for HF and fluid. Please fill, if there are any problems please contact [phone] 08/04/22  Yes Milford, Anderson Malta, FNP  feeding supplement (ENSURE ENLIVE / ENSURE PLUS) LIQD Take 237 mLs by  mouth 2 (two) times daily between meals. 02/05/22  Yes Rolly SalterPatel, Pranav M, MD  finasteride (PROSCAR) 5 MG tablet TAKE 1/2 TABLET BY MOUTH DAILY 11/28/21  Yes Fanny DanceShtridelman, Yuri, MD  fluticasone-salmeterol [facility](WIXELA INHUB) 250-50 MCG/ACT AEPB Inhale 1 puff into the lungs in the morning and at bedtime.   Yes [provider]  Melatonin 10 MG CAPS Take 10 mg by mouth at bedtime as needed (sleep).   Yes [provider]  Multiple Vitamins-Minerals (PRESERVISION AREDS 2 PO) Take 1 tablet by mouth in the morning and at bedtime.   Yes [provider]  pantoprazole (PROTONIX) 40 MG tablet Take 1 tablet (40 mg total) by mouth daily. 10/10/21  Yes Love, Evlyn KannerPamela S, PA-C  PHENobarbital (LUMINAL) 64.8 MG tablet Take 1 tablet  (64.8 mg total) by mouth 2 (two) times daily. 07/24/15  Yes Courtney ParisJones, Eden W, MD  polyethylene glycol (MIRALAX / GLYCOLAX) 17 g packet Take 17 g by mouth 2 (two) times daily. Patient taking differently: Take 17 g by mouth daily. 10/10/21  Yes Love, Evlyn KannerPamela S, PA-C  pravastatin (PRAVACHOL) 40 MG tablet TAKE 1 TABLET BY MOUTH EVERY DAY IN THE EVENING 11/28/21  Yes Fanny DanceShtridelman, Yuri, MD  sacubitril-valsartan (ENTRESTO) 24-26 MG Take 1 tablet by mouth 2 (two) times daily. For heart failure 03/12/22  Yes Andrey FarmerFinch, Lindsay Nicole, PA-C  Tiotropium Bromide Monohydrate (SPIRIVA RESPIMAT) 2.5 MCG/ACT AERS Inhale 2 each into the lungs every evening.   Yes [provider]  torsemide (DEMADEX) 20 MG tablet Take 2 tablets (40 mg total) by mouth every Monday, Wednesday, and Friday. 08/05/22  Yes Milford, Anderson MaltaJessica M, FNP  Vitamin D3 (VITAMIN D) 25 MCG tablet Take 1 tablet (1,000 Units total) by mouth daily. 10/10/21  Yes Love, Evlyn KannerPamela S, PA-C  warfarin (COUMADIN) 3 MG tablet TAKE 1 TABLET to 1 1/2 TABLETS BY MOUTH DAILY OR AS DIRECTED BY COUMADIN CLINIC 06/29/22  Yes Marykay LexHarding, David W, MD  acetaminophen (TYLENOL) 500 MG tablet Take 1,000 mg by mouth every 6 (six) hours as needed for moderate pain.    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 Newell CoralNudelman   Nocturia    Obesity    Pansinusitis    a.  complicated by brain abscess and bleeding requiring craniotomy in 1995.   PONV (postoperative nausea and vomiting)    Presence of permanent cardiac pacemaker    Sinusitis    s/p ethmoidectomy and nasal septoplasty   Vertigo    intermitantly    Past Surgical History: Past Surgical History:  Procedure Laterality Date   BIV UPGRADE N/A  01/29/2020   Procedure: BIV UPGRADE;  Surgeon: Marinus Maw, MD;  Location: [facility] INVASIVE CV LAB;  Service: Cardiovascular;  Laterality: N/A;   CARDIOVERSION N/A 09/26/2021   Procedure: CARDIOVERSION;  Surgeon: Chilton Si, MD;  Location: [facility] ENDOSCOPY;  Service: Cardiovascular;  Laterality: N/A;   CARDIOVERSION N/A 02/03/2022   Procedure: CARDIOVERSION;  Surgeon: Wendall Stade, MD;  Location: [facility] ENDOSCOPY;  Service: Cardiovascular;  Laterality: N/A;   CIRCUMCISION N/A 11/20/2013   Procedure: CIRCUMCISION ADULT;  Surgeon: Garnett Farm, MD;  Location: [facility] ORS;  Service: Urology;  Laterality: N/A;   CRANIOTOMY  1995   hematomy due to sinus infection    CYSTOSCOPY/URETEROSCOPY/HOLMIUM LASER/STENT PLACEMENT Left 12/23/2021   Procedure: LEFT URETEROSCOPY/HOLMIUM LASER LITHOTRIPSY/STENT PLACEMENT retrograde;  Surgeon: Despina Arias, MD;  Location: [facility] ORS;  Service: Urology;  Laterality: Left;  90 MINUTES NEEDED FOR CASE   PACEMAKER IMPLANT N/A 01/17/2020   Procedure: PACEMAKER IMPLANT;  Surgeon: Duke Salvia, MD;  Location: [facility] INVASIVE CV LAB;  Service: Cardiovascular;  Laterality: N/A;   RIGHT/LEFT HEART CATH AND CORONARY ANGIOGRAPHY N/A 09/20/2019   Procedure: RIGHT/LEFT HEART CATH AND CORONARY ANGIOGRAPHY;  Surgeon: Dolores Patty, MD;  Location: [facility] INVASIVE CV LAB;  Service: Cardiovascular;  Laterality: N/A;   shoulder surg rt   1995   SINUS SURGERY WITH INSTATRAK     ethmoidectomy and nasal septum repair   TEE WITHOUT  CARDIOVERSION N/A 09/26/2021   Procedure: TRANSESOPHAGEAL ECHOCARDIOGRAM (TEE);  Surgeon: Chilton Si, MD;  Location: [facility] ENDOSCOPY;  Service: Cardiovascular;  Laterality: N/A;   TEE WITHOUT CARDIOVERSION N/A 02/03/2022   Procedure: TRANSESOPHAGEAL ECHOCARDIOGRAM (TEE);  Surgeon: Wendall Stade, MD;  Location: [facility] ENDOSCOPY;  Service: Cardiovascular;  Laterality: N/A;    Family History: Family History  Problem Relation Age of Onset   Heart failure Mother 63   Heart attack Father 6    Social History: Social History   Socioeconomic History   Marital status: Widowed    Spouse name: Not on file   Number of children: Not on file   Years of education: Not on file   Highest education level: Not on file  Occupational History   Not on file  Tobacco Use   Smoking status: Former    Types: Cigarettes    Quit date: 05/04/1986    Years since quitting: 36.2   Smokeless tobacco: Never   Tobacco comments:    Former smoker 02/12/22  Vaping Use   Vaping Use: Never used  Substance and Sexual Activity   Alcohol use: Not Currently   Drug use: No   Sexual activity: Yes  Other Topics Concern   Not on file  Social History Narrative   Not on file   Social Determinants of Health   Financial Resource Strain: Not on file  Food Insecurity: No Food Insecurity (02/01/2022)   Hunger Vital Sign    Worried About Running Out of Food in the Last Year: Never true    Ran Out of Food in the Last Year: Never true  Transportation Needs: No Transportation Needs (02/01/2022)   PRAPARE - Administrator, Civil Service (Medical): No    Lack of Transportation (Non-Medical): No  Physical Activity: Not on file  Stress: Not on file  Social Connections: Not on file    Allergies:  Allergies  Allergen Reactions   Calcium Channel Blockers Other (See Comments)    Came to hospital in 1995-caused chest pain     Objective:  Vital Signs:   Temp:  [97.5 F (36.4 C)-98.4 F (36.9 C)] 98.4 F  (36.9 C) (04/05 1011) Pulse Rate:  [129-132] 131 (04/05 1300) Resp:  [18-26] 24 (04/05 1230) BP: (81-123)/(64-79) 94/68 (04/05 1300) SpO2:  [94 %-99 %] 95 % (04/05 1300) Weight:  [109.8 kg] 109.8 kg (04/05 0934)    Weight change: Filed Weights   08/07/22 0934  Weight: 109.8 kg    Intake/Output:  No intake or output data in the 24 hours ending 08/07/22 1347    Physical Exam    General:  . No resp difficulty HEENT: normal Neck: supple. JVP 11-12 . Carotids 2+ bilat; no bruits. No lymphadenopathy or thyromegaly appreciated. Cor: PMI nondisplaced. Regular rate & rhythm. No rubs, gallops or murmurs. Lungs: clear Abdomen: soft, nontender, nondistended. No hepatosplenomegaly. No bruits or masses. Good bowel sounds. Extremities: no cyanosis, clubbing, rash, R and LLE 1-2+edema Neuro: alert & orientedx3, cranial nerves grossly intact. moves all 4 extremities w/o difficulty. Affect pleasant   Telemetry  WCT 130   EKG      Labs   Basic Metabolic Panel: Recent Labs  Lab 08/04/22 1250 08/07/22 0938  NA 143 142  K 5.3* 4.3  CL 104 100  CO2 31 28  GLUCOSE 87 128*  BUN 25* 46*  CREATININE 1.76* 2.32*  CALCIUM 9.8 9.4    Liver Function Tests: No results for input(s): "AST", "ALT", "ALKPHOS", "BILITOT", "PROT", "ALBUMIN" in the last 168 hours. No results for input(s): "LIPASE", "AMYLASE" in the last 168 hours. No results for input(s): "AMMONIA" in the last 168 hours.  CBC: Recent Labs  Lab 08/07/22 0938  WBC 9.2  HGB 14.5  HCT 43.8  MCV 96.5  PLT 142*    Cardiac Enzymes: No results for input(s): "CKTOTAL", "CKMB", "CKMBINDEX", "TROPONINI" in the last 168 hours.  BNP: BNP (last 3 results) Recent Labs    06/04/22 1453 08/04/22 1250 08/07/22 1050  BNP 78.8 155.1* 224.3*    ProBNP (last 3 results) No results for input(s): "PROBNP" in the last 8760 hours.   CBG: No results for input(s): "GLUCAP" in the last 168 hours.  Coagulation Studies: Recent  Labs    08/07/22 1013  LABPROT 24.1*  INR 2.2*     Imaging   DG Chest Port 1 View  Result Date: 08/07/2022 CLINICAL DATA:  77 year old male with chest pain and shortness of breath. EXAM: PORTABLE CHEST 1 VIEW COMPARISON:  Chest radiographs 03/06/2022 and earlier. FINDINGS: Portable AP upright view at 1014 hours. Stable cardiomegaly, mediastinal contours, left chest cardiac pacemaker. Similar lordotic positioning. Allowing for portable technique the lungs are clear. No pneumothorax or pleural effusion. Visualized tracheal air column is within normal limits. Paucity of bowel gas in the visible abdomen. No acute osseous abnormality identified. IMPRESSION: Stable cardiomegaly. No acute cardiopulmonary abnormality. Electronically Signed   By: Odessa Fleming M.D.   On: 08/07/2022 10:24     Medications:     Current Medications:   Infusions:  sodium chloride        Patient Profile   Ricky Hayden is a 77 year old with a history of  chronic biventricular CHF, LBBB s/p CRT-P in 2021, atrial fibrillation, CKD, COPD, DM, HLD, HTN, pansinusitis complicated by brain abscess and bleeding requiring craniotomy in 1995.   Admitted with WCT/HF   Assessment/Plan  1. WCT/ A fib /flutter  Multiple admit for A fib + A flutter.  On eliquis 5 mg twice a day  Plan for TEE/DC_CV today  2. Chest Pain  2021 had minimal CAD HS Trop 18>pending   3. Acute/Chronic Biventricular HF/NICM Dating back to 2016. Echo earlier this year EF 30-35%.  Volume status elevated. Give 80 mg IV lasix.  Gradually restart HF meds.   4. CKD IIIa  Creatinine baseline 1.5-1.8  Creatinine on admit 2.3   5. DMII   6.  H/O Brain Abscess S/p craniotomy 2015 - Has been on phenobarbital since 1995, no seizures.    Length of Stay: 0  Tonye Becket, NP  08/07/2022, 1:47 PM  Advanced Heart Failure Team Pager [phone] (M-F; 7a - 5p)  Please contact [facility] for night-coverage after hours (4p -7a ) and weekends on  [email or web address]  Patient seen with NP, agree with the above note.   Patient has history of NICM, St Jude CRT-P device, paroxysmal atrial fibrillation/flutter, COPD, CKD stage 3. He also has history of brain abscess with craniotomy in 1995. He was admitted with dyspnea, weight gain, and tachycardia.  Noted to be in rapid atrial flutter in ER.    TEE-DCCV was done with conversion to NSR.  TEE showed mildly dilated LV with septal-lateral dyssynchrony and EF < 20%, moderate RV dysfunction.    General: NAD Neck: JVP difficult but appears elevated, no thyromegaly or thyroid nodule.  Lungs: Clear to auscultation bilaterally with normal respiratory effort. CV: Nondisplaced PMI.  Heart tachy, irregular S1/S2, no S3/S4, no murmur.  1+ edema to knees.  Difficult to palpate pedal pulses.  Abdomen: Soft, nontender, no hepatosplenomegaly, no distention.  Skin: Intact without lesions or rashes.  Neurologic: Alert and oriented x 3.  Psych: Normal affect. Extremities: No clubbing or cyanosis.  HEENT: Normal.   1. Atrial fibrillation/flutter: Long history of atrial arrhythmias.  Had been maintaining NSR with amiodarone but was admitted with AF/RVR and CHF exacerbation.  Now s/p DCCV.  - Continue amiodarone gtt.  - Continue warfarin 2. Acute on chronic systolic CHF: Nonischemic cardiomyopathy, has St Jude CRT-P device.  TEE this admission showed mildly dilated LV with septal-lateral dyssynchrony and EF < 20%, moderate RV dysfunction.  Initially not BiV pacing due to rapid AFL, now back to BiV pacing.  He is volume overloaded on exam. Creatinine up to 2.3 with CHF and AFL/RVR.  - Lasix 80 mg IV bid.  - Restart Jardiance 10 mg daily - Decrease Coreg to 3.125 mg bid.  - spironolactone 12.5 daily - Hold Entresto for now with creatinine rise, restart when able.  3. AKI on CKD stage 3: Creatinine 2.3 today, baseline around 1.7.  Hopefully will improve with increased CO in NSR.  - Follow BMET closely.  4. Type 2 DM:  SSI 5. COPD  Marca Ancona 08/07/2022 3:43 PM

## 2022-08-07 NOTE — ED Triage Notes (Signed)
Pt. Stated, I started having heart pain this morning. I have COPD  but have some pain. I do have SOB more than usual. Son stated, His heart rate has been more on the high side in the last 3-4 days.

## 2022-08-07 NOTE — Transfer of Care (Signed)
Immediate Anesthesia Transfer of Care Note  Patient: Ricky Hayden  Procedure(s) Performed: TRANSESOPHAGEAL ECHOCARDIOGRAM (TEE) CARDIOVERSION  Patient Location: PACU  Anesthesia Type:MAC  Level of Consciousness: awake, alert , and oriented  Airway & Oxygen Therapy: Patient Spontanous Breathing and Patient connected to face mask oxygen  Post-op Assessment: Report given to RN and Post -op Vital signs reviewed and stable  Post vital signs: Reviewed and stable  Last Vitals:  Vitals Value Taken Time  BP 99/77 08/07/22 1452  Temp 36.4 C 08/07/22 1451  Pulse 78 08/07/22 1457  Resp 21 08/07/22 1457  SpO2 94 % 08/07/22 1457  Vitals shown include unvalidated device data.  Last Pain:  Vitals:   08/07/22 1451  TempSrc: Temporal  PainSc: 0-No pain         Complications: No notable events documented.

## 2022-08-07 NOTE — CV Procedure (Signed)
Procedure: TEE  Sedation: Per anesthesiology  Indication: Atrial fibrillation  Findings: Please see echo section for full report.  Mildly dilated LV with septal-lateral dyssynchrony and global hypokinesis, EF < 20%.  Mild LV hypertrophy.  Mild RV dilation with moderate systolic dysfunction.  Mild right atrial enlargement.  Moderate left atrial enlargement with no thrombus in LA appendage.  No PFO or ASD by color doppler. Trivial TR.  Trivial MR.  Trileaflet aortic valve with no significant regurgitation or stenosis. 4.3 cm aortic root.   May proceed to DCCV.   Ricky Hayden 08/07/2022 2:50 PM

## 2022-08-07 NOTE — Procedures (Addendum)
Electrical Cardioversion Procedure Note Ricky Hayden [id number] 11-01-1945  Procedure: Electrical Cardioversion Indications:  Atrial Fibrillation  Procedure Details Consent: Risks of procedure as well as the alternatives and risks of each were explained to the (patient/caregiver).  Consent for procedure obtained. Time Out: Verified patient identification, verified procedure, site/side was marked, verified correct patient position, special equipment/implants available, medications/allergies/relevent history reviewed, required imaging and test results available.  Performed  Patient placed on cardiac monitor, pulse oximetry, supplemental oxygen as necessary.  Sedation given:  Propofol per anesthesiology Pacer pads placed anterior and posterior chest.  Cardioverted 1 time(s).  Cardioverted at 200J.  Evaluation Findings: Post procedure EKG shows: NSR Complications: None Patient did tolerate procedure well.   Ricky Hayden 08/07/2022, 2:51 PM

## 2022-08-07 NOTE — Anesthesia Procedure Notes (Signed)
Procedure Name: MAC Date/Time: 08/07/2022 2:23 PM  Performed by: Marena Chancy, CRNAPre-anesthesia Checklist: Emergency Drugs available, Patient identified, Suction available, Patient being monitored and Timeout performed Patient Re-evaluated:Patient Re-evaluated prior to induction Oxygen Delivery Method: Simple face mask and Nasal cannula

## 2022-08-07 NOTE — Anesthesia Preprocedure Evaluation (Addendum)
Anesthesia Evaluation  Patient identified by MRN, date of birth, ID band Patient awake    Reviewed: Allergy & Precautions, NPO status , Patient's Chart, lab work & pertinent test results  History of Anesthesia Complications (+) PONV and history of anesthetic complications  Airway Mallampati: III  TM Distance: >3 FB Neck ROM: Full    Dental  (+) Poor Dentition, Dental Advisory Given 6 teeth on the top, 10 on the bottom:   Pulmonary shortness of breath, neg sleep apnea, COPD (2L O2 at night),  oxygen dependent, neg recent URI, former smoker   Pulmonary exam normal breath sounds clear to auscultation       Cardiovascular hypertension, (-) angina + CAD and +CHF  (-) Past MI, (-) Cardiac Stents and (-) CABG + dysrhythmias (on warfarin) Atrial Fibrillation + pacemaker  Rhythm:Regular Rate:Normal  Ascending aorta dilation, HLD  TTE 05/12/2022: IMPRESSIONS     1. Endocardial border definition is poor even with the use of Definitiy  contrast.   2. Left ventricular ejection fraction, by estimation, is 30 to 35%. The  left ventricle has moderately decreased function. The left ventricle  demonstrates global hypokinesis. There is moderate concentric left  ventricular hypertrophy. Left ventricular  diastolic parameters are consistent with Grade I diastolic dysfunction  (impaired relaxation).   3. Right ventricular systolic function is normal. The right ventricular  size is normal.   4. Right atrial size was moderately dilated.   5. The mitral valve is normal in structure. Trivial mitral valve  regurgitation. No evidence of mitral stenosis.   6. The aortic valve was not well visualized. Aortic valve regurgitation  is not visualized. No aortic stenosis is present.   7. Aortic dilatation noted. There is moderate dilatation of the aortic  root, measuring 43 mm. There is moderate dilatation of the ascending  aorta, measuring 44 mm.   8. The  inferior vena cava is normal in size with greater than 50%  respiratory variability, suggesting right atrial pressure of 3 mmHg.     Neuro/Psych Seizures - (on phenobarbital), Well Controlled,  vertigo    GI/Hepatic Neg liver ROS, hiatal hernia,GERD  Medicated,,  Endo/Other  diabetes, Type 2    Renal/GU CRFRenal disease     Musculoskeletal  (+) Arthritis , Osteoarthritis,    Abdominal  (+) + obese  Peds  Hematology  (+) Blood dyscrasia, anemia   Anesthesia Other Findings 77 y.o. 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  INR 2.2  Reproductive/Obstetrics                             Anesthesia Physical Anesthesia Plan  ASA: 4  Anesthesia Plan: MAC   Post-op Pain Management:    Induction: Intravenous  PONV Risk Score and Plan: 2 and Propofol infusion and Treatment may vary due to age or medical condition  Airway Management Planned: Natural Airway and Nasal Cannula  Additional Equipment:   Intra-op Plan:   Post-operative Plan:   Informed Consent: I have reviewed the patients History and Physical, chart, labs and discussed the procedure including the risks, benefits and alternatives for the proposed anesthesia with the patient or authorized representative who has indicated his/her understanding and acceptance.     Dental advisory  given  Plan Discussed with: CRNA and Anesthesiologist  Anesthesia Plan Comments: (Discussed with patient risks of MAC including, but not limited to, minor pain or discomfort, hearing people in the room, and possible need for backup general anesthesia. Risks for general anesthesia also discussed including, but not limited to, sore throat, hoarse voice,  chipped/damaged teeth, injury to vocal cords, nausea and vomiting, allergic reactions, lung infection, heart attack, stroke, and death. All questions answered. )        Anesthesia Quick Evaluation

## 2022-08-07 NOTE — ED Notes (Signed)
Cards at bedside

## 2022-08-08 DIAGNOSIS — I5043 Acute on chronic combined systolic (congestive) and diastolic (congestive) heart failure: Secondary | ICD-10-CM | POA: Diagnosis not present

## 2022-08-08 DIAGNOSIS — I4892 Unspecified atrial flutter: Secondary | ICD-10-CM | POA: Diagnosis not present

## 2022-08-08 DIAGNOSIS — I4819 Other persistent atrial fibrillation: Secondary | ICD-10-CM

## 2022-08-08 LAB — COMPREHENSIVE METABOLIC PANEL
ALT: 15 U/L (ref 0–44)
AST: 18 U/L (ref 15–41)
Albumin: 3.6 g/dL (ref 3.5–5.0)
Alkaline Phosphatase: 43 U/L (ref 38–126)
Anion gap: 11 (ref 5–15)
BUN: 41 mg/dL — ABNORMAL HIGH (ref 8–23)
CO2: 29 mmol/L (ref 22–32)
Calcium: 8.7 mg/dL — ABNORMAL LOW (ref 8.9–10.3)
Chloride: 98 mmol/L (ref 98–111)
Creatinine, Ser: 2.26 mg/dL — ABNORMAL HIGH (ref 0.61–1.24)
GFR, Estimated: 29 mL/min — ABNORMAL LOW (ref >60)
Glucose, Bld: 99 mg/dL (ref 70–99)
Potassium: 3.9 mmol/L (ref 3.5–5.1)
Sodium: 138 mmol/L (ref 135–145)
Total Bilirubin: 0.4 mg/dL (ref 0.3–1.2)
Total Protein: 6.8 g/dL (ref 6.5–8.1)

## 2022-08-08 LAB — CBC
HCT: 39.2 % (ref 39.0–52.0)
Hemoglobin: 13.4 g/dL (ref 13.0–17.0)
MCH: 32.4 pg (ref 26.0–34.0)
MCHC: 34.2 g/dL (ref 30.0–36.0)
MCV: 94.7 fL (ref 80.0–100.0)
Platelets: 118 10*3/uL — ABNORMAL LOW (ref 150–400)
RBC: 4.14 MIL/uL — ABNORMAL LOW (ref 4.22–5.81)
RDW: 13.4 % (ref 11.5–15.5)
WBC: 5.7 10*3/uL (ref 4.0–10.5)
nRBC: 0 % (ref 0.0–0.2)

## 2022-08-08 LAB — GLUCOSE, CAPILLARY
Glucose-Capillary: 93 mg/dL (ref 70–99)
Glucose-Capillary: 126 mg/dL — ABNORMAL HIGH (ref 70–99)
Glucose-Capillary: 117 mg/dL — ABNORMAL HIGH (ref 70–99)

## 2022-08-08 LAB — HEMOGLOBIN A1C
Hgb A1c MFr Bld: 5.3 % (ref 4.8–5.6)
Mean Plasma Glucose: 105.41 mg/dL

## 2022-08-08 LAB — PROTIME-INR
INR: 2.3 — ABNORMAL HIGH (ref 0.8–1.2)
Prothrombin Time: 24.8 seconds — ABNORMAL HIGH (ref 11.4–15.2)

## 2022-08-08 MED ORDER — CARVEDILOL 6.25 MG PO TABS
6.2500 mg | ORAL_TABLET | Freq: Two times a day (BID) | ORAL | Status: DC
  Administered 2022-08-08 – 2022-08-09 (×2): 6.25 mg via ORAL
  Filled 2022-08-08 (×2): qty 1

## 2022-08-08 MED ORDER — WARFARIN SODIUM 2 MG PO TABS
3.0000 mg | ORAL_TABLET | Freq: Once | ORAL | Status: AC
  Administered 2022-08-08: 3 mg via ORAL
  Filled 2022-08-08: qty 1

## 2022-08-08 NOTE — Progress Notes (Signed)
Note by Janie Morning, student RN:  Patient ambulated in the hall at 1620. 451ft total. Tolerated well. Had to stop on the way back to apply nasal cannula at 4L due to oxygen stats low 80s. Dyspnea upon exertion. Returned back to room SPO2 back up to >94% and weaned down oxygen. Took off oxygen SPO2 stats remained >94% on RA.   I confirm Isabella's findings and interventions. With ambulation, pt required 4L O2 as sats dropped to low 80s. Pt returned to room and after recovery, maintained >94% on RA.

## 2022-08-08 NOTE — Progress Notes (Cosign Needed)
Patient ambulated in the hall at 1620. 453ft total. Tolerated well. Had to stop on the way back to apply nasal cannula at 4L due to oxygen stats low 80s. Dyspnea upon exertion. Returned back to room SPO2 back up to >94% and weaned down oxygen. Took off oxygen SPO2 stats remained >94% on RA.

## 2022-08-08 NOTE — Progress Notes (Signed)
ANTICOAGULATION CONSULT NOTE - Follow Up  Pharmacy Consult for Warfarin Indication: atrial fibrillation  Allergies  Allergen Reactions   Calcium Channel Blockers Other (See Comments)    Came to hospital in 1995-caused chest pain     Patient Measurements: Height: 5\' 7"  (170.2 cm) Weight: 109.8 kg (242 lb) IBW/kg (Calculated) : 66.1   Vital Signs: Temp: 97.7 F (36.5 C) (04/06 0726) Temp Source: Oral (04/06 0726) BP: 131/72 (04/06 0726) Pulse Rate: 64 (04/06 0726)  Labs: Recent Labs    08/07/22 0938 08/07/22 1013 08/07/22 1138 08/08/22 0128  HGB 14.5  --   --  13.4  HCT 43.8  --   --  39.2  PLT 142*  --   --  118*  LABPROT  --  24.1*  --  24.8*  INR  --  2.2*  --  2.3*  CREATININE 2.32*  --   --  2.26*  TROPONINIHS 18*  --  18*  --      Estimated Creatinine Clearance: 31.9 mL/min (A) (by C-G formula based on SCr of 2.26 mg/dL (H)).   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 Newell Coral   Nocturia    Obesity    Pansinusitis    a.  complicated by brain abscess and bleeding requiring craniotomy in 1995.   PONV (postoperative nausea and vomiting)    Presence of permanent cardiac pacemaker    Sinusitis    s/p ethmoidectomy and nasal septoplasty   Vertigo     intermitantly    Assessment: 70 YOM admitted for cardioversion for atrial fibrillation, on warfarin prior to admission. Pharmacy consulted to dose warfarin.   Home regimen is 4.5mg  qSunday and 3mg  all other days.   INR 2.3 this morning. Plts 142>118, Hgb 14.5>13.4, no reports of signs/symptoms of bleeding per RN  Goal of Therapy:  INR 2-3 Monitor platelets by anticoagulation protocol: Yes   Plan:  Warfarin 3mg  PO x1 Daily INR and CBC Monitor for signs/symptoms of bleeding  Arabella Merles, PharmD. [facility] Acute Care PGY-1  08/08/2022 8:24 AM

## 2022-08-08 NOTE — Progress Notes (Signed)
Per pt no HX of DM, no concerning CBGs or glucose results noted  08/08/22 A1C resulted 5.3 Pt asking to d/c ACHS CBGs & sliding scale insulin

## 2022-08-08 NOTE — Progress Notes (Signed)
Electrophysiology Progress Note  Patient Name: Ricky Hayden Date of Encounter: 08/08/2022  Primary Cardiologist: Bryan Lemma, MD Electrophysiologist: Dr. Graciela Husbands AHF: Dr. Gala Romney   Subjective   Feels well today, much better in sinus rhythm.  Inpatient Medications    Scheduled Meds:  carvedilol  3.125 mg Oral BID WC   empagliflozin  10 mg Oral Daily   furosemide  80 mg Intravenous BID   insulin aspart  0-15 Units Subcutaneous TID WC   insulin aspart  0-5 Units Subcutaneous QHS   mometasone-formoterol  2 puff Inhalation BID   pantoprazole  40 mg Oral Daily   PHENobarbital  64.8 mg Oral BID   pravastatin  40 mg Oral q1800   spironolactone  12.5 mg Oral Daily   umeclidinium bromide  1 puff Inhalation Daily   Warfarin - Pharmacist Dosing Inpatient   Does not apply q1600   Continuous Infusions:  amiodarone 30 mg/hr (08/08/22 0505)   PRN Meds: acetaminophen, albuterol, melatonin, nitroGLYCERIN, ondansetron (ZOFRAN) IV   Vital Signs    Vitals:   08/07/22 1955 08/07/22 2354 08/08/22 0449 08/08/22 0726  BP: 103/62 126/81 (!) 140/76 131/72  Pulse: 66 66 63 64  Resp: (!) 23 18 19  (!) 21  Temp: 98.7 F (37.1 C) 98.1 F (36.7 C) 97.9 F (36.6 C) 97.7 F (36.5 C)  TempSrc: Oral Oral Oral Oral  SpO2: 95% 99% 99% 99%  Weight:      Height:        Intake/Output Summary (Last 24 hours) at 08/08/2022 0727 Last data filed at 08/08/2022 6381 Gross per 24 hour  Intake 712.02 ml  Output 1350 ml  Net -637.98 ml   Filed Weights   08/07/22 0934  Weight: 109.8 kg    Physical Exam    Gen: Appears comfortable, well-nourished CV: RRR, + dependent edema (L>>R) The device site is normal -- no tenderness, edema, drainage, redness, threatened erosion. Pulm: breathing easily   Labs    CBC Recent Labs    08/07/22 0938 08/08/22 0128  WBC 9.2 5.7  HGB 14.5 13.4  HCT 43.8 39.2  MCV 96.5 94.7  PLT 142* 118*   Basic Metabolic Panel Recent Labs    77/11/65 0938  08/08/22 0128  NA 142 138  K 4.3 3.9  CL 100 98  CO2 28 29  GLUCOSE 128* 99  BUN 46* 41*  CREATININE 2.32* 2.26*  CALCIUM 9.4 8.7*   Liver Function Tests Recent Labs    08/08/22 0128  AST 18  ALT 15  ALKPHOS 43  BILITOT 0.4  PROT 6.8  ALBUMIN 3.6   No results for input(s): "LIPASE", "AMYLASE" in the last 72 hours. Cardiac Enzymes No results for input(s): "CKTOTAL", "CKMB", "CKMBINDEX", "TROPONINI" in the last 72 hours. BNP Invalid input(s): "POCBNP" D-Dimer No results for input(s): "DDIMER" in the last 72 hours. Hemoglobin A1C Recent Labs    08/08/22 0128  HGBA1C 5.3   Fasting Lipid Panel No results for input(s): "CHOL", "HDL", "LDLCALC", "TRIG", "CHOLHDL", "LDLDIRECT" in the last 72 hours. Thyroid Function Tests No results for input(s): "TSH", "T4TOTAL", "T3FREE", "THYROIDAB" in the last 72 hours.  Invalid input(s): "FREET3"  Telemetry    Sinus rhythm, V-paced with narrow complex. Intermittent atrial pacing. (personally reviewed)  Radiology    DG Chest Port 1 View  Result Date: 08/07/2022 CLINICAL DATA:  77 year old male with chest pain and shortness of breath. EXAM: PORTABLE CHEST 1 VIEW COMPARISON:  Chest radiographs 03/06/2022 and earlier. FINDINGS: Portable AP upright view at  1014 hours. Stable cardiomegaly, mediastinal contours, left chest cardiac pacemaker. Similar lordotic positioning. Allowing for portable technique the lungs are clear. No pneumothorax or pleural effusion. Visualized tracheal air column is within normal limits. Paucity of bowel gas in the visible abdomen. No acute osseous abnormality identified. IMPRESSION: Stable cardiomegaly. No acute cardiopulmonary abnormality. Electronically Signed   By: Odessa Fleming M.D.   On: 08/07/2022 10:24     Patient Profile     Ricky Hayden is a 77 y.o. male with a past medical history significant for CHFrEF, NICM, atrial fibrillation and flutter.  He was admitted for Acute CHF due to atrial fibrillation and  flutter with rapid ventricular rates.   Assessment & Plan    Atrial Fibrillation and Flutter DCCV and TEE 4/5 Multiple recurrences in past half half year requiring intervention Continue amiodarone drip through the weekend CHA2DS2Vasc is 5; INR subtherapeutic on presentation Warfarin per pharmacy.  Presence of Abbott CRT-P Left bundle lead in LV port RV lead programmed subthreshold  CHFrEF EF 30-35% 05/12/22 NICM Advance heart failure following - receiving IV lasix, Jardiance 10, coreg 3.25, spironolactone 12.5.  Entresto held for AKI  AKI on CKD stage III Cr slightly lower today  Non-obstructive CAD     For questions or updates, please contact [facility] Please consult [email or web address] for contact info under Cardiology/STEMI.  Signed, Maurice Small, MD  08/08/2022, 7:27 AM

## 2022-08-08 NOTE — Progress Notes (Signed)
Patient ID: Ricky Hayden, male   DOB: 1945/06/29, 77 y.o.   MRN: [id number]     Advanced Heart Failure Rounding Note  PCP-Cardiologist: Bryan Lemma, MD   Subjective:    S/p TEE-guided DCCV yesterday.  He remains in NSR today.  INR 2.3.  On amiodarone gtt + warfarin.   Diuresed with IV Lasix, creatinine 2.32 => 2.26. BP stable.    Objective:   Weight Range: 109.8 kg Body mass index is 37.9 kg/m.   Vital Signs:   Temp:  [97.4 F (36.3 C)-98.7 F (37.1 C)] 98.1 F (36.7 C) (04/06 1134) Pulse Rate:  [63-133] 65 (04/06 1134) Resp:  [17-24] 20 (04/06 1134) BP: (94-145)/(62-89) 122/78 (04/06 1134) SpO2:  [94 %-100 %] 100 % (04/06 1134) Last BM Date : 08/06/22  Weight change: Filed Weights   08/07/22 0934  Weight: 109.8 kg    Intake/Output:   Intake/Output Summary (Last 24 hours) at 08/08/2022 1153 Last data filed at 08/08/2022 1135 Gross per 24 hour  Intake 712.02 ml  Output 2450 ml  Net -1737.98 ml      Physical Exam    General:  Well appearing. No resp difficulty HEENT: Normal Neck: Supple. JVP 7. Carotids 2+ bilat; no bruits. No lymphadenopathy or thyromegaly appreciated. Cor: PMI nondisplaced. Regular rate & rhythm. No rubs, gallops or murmurs. Lungs: Clear Abdomen: Soft, nontender, nondistended. No hepatosplenomegaly. No bruits or masses. Good bowel sounds. Extremities: No cyanosis, clubbing, rash. 1+ edema 1/2 to knees bilaterally.  Neuro: Alert & orientedx3, cranial nerves grossly intact. moves all 4 extremities w/o difficulty. Affect pleasant   Telemetry   NSR with BiV pacing (personally reviewed)  Labs    CBC Recent Labs    08/07/22 0938 08/08/22 0128  WBC 9.2 5.7  HGB 14.5 13.4  HCT 43.8 39.2  MCV 96.5 94.7  PLT 142* 118*   Basic Metabolic Panel Recent Labs    93/57/01 0938 08/08/22 0128  NA 142 138  K 4.3 3.9  CL 100 98  CO2 28 29  GLUCOSE 128* 99  BUN 46* 41*  CREATININE 2.32* 2.26*  CALCIUM 9.4 8.7*   Liver Function  Tests Recent Labs    08/08/22 0128  AST 18  ALT 15  ALKPHOS 43  BILITOT 0.4  PROT 6.8  ALBUMIN 3.6   No results for input(s): "LIPASE", "AMYLASE" in the last 72 hours. Cardiac Enzymes No results for input(s): "CKTOTAL", "CKMB", "CKMBINDEX", "TROPONINI" in the last 72 hours.  BNP: BNP (last 3 results) Recent Labs    06/04/22 1453 08/04/22 1250 08/07/22 1050  BNP 78.8 155.1* 224.3*    ProBNP (last 3 results) No results for input(s): "PROBNP" in the last 8760 hours.   D-Dimer No results for input(s): "DDIMER" in the last 72 hours. Hemoglobin A1C Recent Labs    08/08/22 0128  HGBA1C 5.3   Fasting Lipid Panel No results for input(s): "CHOL", "HDL", "LDLCALC", "TRIG", "CHOLHDL", "LDLDIRECT" in the last 72 hours. Thyroid Function Tests No results for input(s): "TSH", "T4TOTAL", "T3FREE", "THYROIDAB" in the last 72 hours.  Invalid input(s): "FREET3"  Other results:   Imaging    No results found.   Medications:     Scheduled Medications:  carvedilol  3.125 mg Oral BID WC   empagliflozin  10 mg Oral Daily   insulin aspart  0-15 Units Subcutaneous TID WC   insulin aspart  0-5 Units Subcutaneous QHS   mometasone-formoterol  2 puff Inhalation BID   pantoprazole  40 mg Oral Daily  PHENobarbital  64.8 mg Oral BID   pravastatin  40 mg Oral q1800   spironolactone  12.5 mg Oral Daily   umeclidinium bromide  1 puff Inhalation Daily   warfarin  3 mg Oral ONCE-1600   Warfarin - Pharmacist Dosing Inpatient   Does not apply q1600    Infusions:  amiodarone 30 mg/hr (08/08/22 0505)    PRN Medications: acetaminophen, albuterol, melatonin, nitroGLYCERIN, ondansetron (ZOFRAN) IV   Assessment/Plan   1. Atrial fibrillation/flutter: Long history of atrial arrhythmias.  Had been maintaining NSR with amiodarone but was admitted with AF/RVR and CHF exacerbation.  Now s/p DCCV. Now back in NSR.  - Continue amiodarone gtt today, start amiodarone 200 mg bid tomorrow for  home.  - Continue warfarin 2. Acute on chronic systolic CHF: Nonischemic cardiomyopathy, has [employer] Jude CRT-P device.  TEE this admission showed mildly dilated LV with septal-lateral dyssynchrony and EF < 20%, moderate RV dysfunction.  Initially not BiV pacing due to rapid AFL, now back to BiV pacing.  Volume status looks better today, creatinine mildly lower at 2.32.  - He had 1 dose IV Lasix this morning, will stop IV Lasix. Start po diuretic tomorrow.  - Continue Jardiance 10 mg daily - Can increase Coreg back to home dose 6.25 mg bid.   - spironolactone 12.5 daily - Hold Entresto for now with creatinine rise, restart when able.  3. AKI on CKD stage 3: Creatinine 2.3 => 2.26 today, baseline around 1.7.  Hopefully will improve with increased CO in NSR.  - Follow BMET closely.  4. Type 2 DM: SSI 5. COPD  Watch today, stop IV lasix, aim for home tomorrow.   Length of Stay: 1  Marca Ancona, MD  08/08/2022, 11:53 AM  Advanced Heart Failure Team Pager [phone] (M-F; 7a - 5p)  Please contact [facility] for night-coverage after hours (5p -7a ) and weekends on [email or web address]

## 2022-08-09 LAB — BASIC METABOLIC PANEL
Anion gap: 10 (ref 5–15)
BUN: 42 mg/dL — ABNORMAL HIGH (ref 8–23)
CO2: 29 mmol/L (ref 22–32)
Calcium: 9.2 mg/dL (ref 8.9–10.3)
Chloride: 99 mmol/L (ref 98–111)
Creatinine, Ser: 2.17 mg/dL — ABNORMAL HIGH (ref 0.61–1.24)
GFR, Estimated: 30 mL/min — ABNORMAL LOW (ref >60)
Glucose, Bld: 110 mg/dL — ABNORMAL HIGH (ref 70–99)
Potassium: 3.8 mmol/L (ref 3.5–5.1)
Sodium: 138 mmol/L (ref 135–145)

## 2022-08-09 LAB — CBC
HCT: 39.2 % (ref 39.0–52.0)
Hemoglobin: 13.4 g/dL (ref 13.0–17.0)
MCH: 32 pg (ref 26.0–34.0)
MCHC: 34.2 g/dL (ref 30.0–36.0)
MCV: 93.6 fL (ref 80.0–100.0)
Platelets: 122 10*3/uL — ABNORMAL LOW (ref 150–400)
RBC: 4.19 MIL/uL — ABNORMAL LOW (ref 4.22–5.81)
RDW: 13.2 % (ref 11.5–15.5)
WBC: 4.6 10*3/uL (ref 4.0–10.5)
nRBC: 0 % (ref 0.0–0.2)

## 2022-08-09 LAB — PROTIME-INR
INR: 2.2 — ABNORMAL HIGH (ref 0.8–1.2)
Prothrombin Time: 23.9 seconds — ABNORMAL HIGH (ref 11.4–15.2)

## 2022-08-09 MED ORDER — ENTRESTO 24-26 MG PO TABS: 1.0000 | ORAL_TABLET | Freq: Two times a day (BID) | ORAL | 8 refills | Status: DC

## 2022-08-09 MED ORDER — AMIODARONE HCL 200 MG PO TABS
200.0000 mg | ORAL_TABLET | Freq: Two times a day (BID) | ORAL | Status: DC
  Administered 2022-08-09: 200 mg via ORAL
  Filled 2022-08-09: qty 1

## 2022-08-09 MED ORDER — WARFARIN SODIUM 2.5 MG PO TABS: 4.5000 mg | ORAL_TABLET | Freq: Once | ORAL | Status: DC

## 2022-08-09 MED ORDER — AMIODARONE HCL 200 MG PO TABS: 200.0000 mg | ORAL_TABLET | Freq: Two times a day (BID) | ORAL | Status: DC

## 2022-08-09 MED ORDER — EMPAGLIFLOZIN 10 MG PO TABS: 10.0000 mg | ORAL_TABLET | Freq: Every day | ORAL | 3 refills | Status: AC

## 2022-08-09 MED ORDER — AMIODARONE HCL 200 MG PO TABS: ORAL_TABLET | ORAL | 0 refills | Status: DC

## 2022-08-09 MED ORDER — WARFARIN SODIUM 3 MG PO TABS
3.0000 mg | ORAL_TABLET | ORAL | 1 refills | Status: DC
Start: 2022-08-09 — End: 2022-08-31

## 2022-08-09 MED ORDER — TORSEMIDE 40 MG PO TABS: 40.0000 mg | ORAL_TABLET | Freq: Every day | ORAL | 3 refills | Status: DC

## 2022-08-09 MED ORDER — SPIRONOLACTONE 25 MG PO TABS: 12.5000 mg | ORAL_TABLET | Freq: Every day | ORAL | 3 refills | Status: DC

## 2022-08-09 NOTE — Progress Notes (Signed)
Electrophysiology Progress Note  Patient Name: Hervin Duch Date of Encounter: 08/09/2022  Primary Cardiologist: Bryan Lemma, MD Electrophysiologist: Dr. Graciela Husbands AHF: Dr. Gala Romney   Subjective   Feels well today, much better in sinus rhythm.  Inpatient Medications    Scheduled Meds:  carvedilol  6.25 mg Oral BID WC   empagliflozin  10 mg Oral Daily   insulin aspart  0-15 Units Subcutaneous TID WC   insulin aspart  0-5 Units Subcutaneous QHS   mometasone-formoterol  2 puff Inhalation BID   pantoprazole  40 mg Oral Daily   PHENobarbital  64.8 mg Oral BID   pravastatin  40 mg Oral q1800   spironolactone  12.5 mg Oral Daily   umeclidinium bromide  1 puff Inhalation Daily   warfarin  4.5 mg Oral ONCE-1600   Warfarin - Pharmacist Dosing Inpatient   Does not apply q1600   Continuous Infusions:  amiodarone 30 mg/hr (08/09/22 0623)   PRN Meds: acetaminophen, albuterol, melatonin, nitroGLYCERIN, ondansetron (ZOFRAN) IV   Vital Signs    Vitals:   08/08/22 1932 08/09/22 0419 08/09/22 0421 08/09/22 0737  BP: 106/67 130/69  123/76  Pulse: 72 64  61  Resp: (!) 21 20  20   Temp: 98.4 F (36.9 C) 98.4 F (36.9 C)  98.2 F (36.8 C)  TempSrc: Oral Oral  Oral  SpO2: 96% 97%  92%  Weight:   110.1 kg   Height:        Intake/Output Summary (Last 24 hours) at 08/09/2022 0935 Last data filed at 08/09/2022 2119 Gross per 24 hour  Intake 506.54 ml  Output 2540 ml  Net -2033.46 ml   Filed Weights   08/07/22 0934 08/09/22 0421  Weight: 109.8 kg 110.1 kg    Physical Exam    Gen: Appears comfortable, well-nourished CV: RRR, + dependent edema (L>>R) The device site is normal -- no tenderness, edema, drainage, redness, threatened erosion. Pulm: breathing easily   Labs    CBC Recent Labs    08/08/22 0128 08/09/22 0216  WBC 5.7 4.6  HGB 13.4 13.4  HCT 39.2 39.2  MCV 94.7 93.6  PLT 118* 122*   Basic Metabolic Panel Recent Labs    41/74/08 0128 08/09/22 0216  NA  138 138  K 3.9 3.8  CL 98 99  CO2 29 29  GLUCOSE 99 110*  BUN 41* 42*  CREATININE 2.26* 2.17*  CALCIUM 8.7* 9.2   Liver Function Tests Recent Labs    08/08/22 0128  AST 18  ALT 15  ALKPHOS 43  BILITOT 0.4  PROT 6.8  ALBUMIN 3.6   No results for input(s): "LIPASE", "AMYLASE" in the last 72 hours. Cardiac Enzymes No results for input(s): "CKTOTAL", "CKMB", "CKMBINDEX", "TROPONINI" in the last 72 hours. BNP Invalid input(s): "POCBNP" D-Dimer No results for input(s): "DDIMER" in the last 72 hours. Hemoglobin A1C Recent Labs    08/08/22 0128  HGBA1C 5.3   Fasting Lipid Panel No results for input(s): "CHOL", "HDL", "LDLCALC", "TRIG", "CHOLHDL", "LDLDIRECT" in the last 72 hours. Thyroid Function Tests No results for input(s): "TSH", "T4TOTAL", "T3FREE", "THYROIDAB" in the last 72 hours.  Invalid input(s): "FREET3"  Telemetry    Sinus rhythm, V-paced with narrow complex. Intermittent atrial pacing. (personally reviewed)  Radiology    DG Chest Port 1 View  Result Date: 08/07/2022 CLINICAL DATA:  77 year old male with chest pain and shortness of breath. EXAM: PORTABLE CHEST 1 VIEW COMPARISON:  Chest radiographs 03/06/2022 and earlier. FINDINGS: Portable AP upright view at  1014 hours. Stable cardiomegaly, mediastinal contours, left chest cardiac pacemaker. Similar lordotic positioning. Allowing for portable technique the lungs are clear. No pneumothorax or pleural effusion. Visualized tracheal air column is within normal limits. Paucity of bowel gas in the visible abdomen. No acute osseous abnormality identified. IMPRESSION: Stable cardiomegaly. No acute cardiopulmonary abnormality. Electronically Signed   By: Odessa Fleming M.D.   On: 08/07/2022 10:24     Patient Profile     Dontrey Sikorski is a 77 y.o. male with a past medical history significant for CHFrEF, NICM, atrial fibrillation and flutter.  He was admitted for Acute CHF due to atrial fibrillation and flutter with rapid  ventricular rates.   Assessment & Plan    Atrial Fibrillation and Flutter DCCV and TEE 4/5 Multiple recurrences in past half half year requiring intervention Continue amiodarone drip -- patient expects to be discharged today. It looks like he will have had about 2.5g of amiodarone by this afternoon. I think it reasonable to discharge late today on 200mg  BID for 2 weeks followed by 200mg  daily.  CHA2DS2Vasc is 5; INR subtherapeutic on presentation Warfarin per pharmacy.  Presence of Abbott CRT-P Left bundle lead in LV port RV lead programmed subthreshold  CHFrEF EF 30-35% 05/12/22 NICM Advance heart failure following - receiving IV lasix, Jardiance 10, coreg 3.25, spironolactone 12.5.  Entresto held for AKI  AKI on CKD stage III Cr slightly lower today  Non-obstructive CAD     For questions or updates, please contact [facility] Please consult [email or web address] for contact info under Cardiology/STEMI.  Signed, Maurice Small, MD  08/09/2022, 9:35 AM

## 2022-08-09 NOTE — Progress Notes (Signed)
Patient ID: Ricky Hayden, male   DOB: 1945/07/29, 77 y.o.   MRN: [id number]     Advanced Heart Failure Rounding Note  PCP-Cardiologist: Bryan Lemma, MD   Subjective:    S/p TEE-guided DCCV yesterday.  He remains in NSR today.  INR 2.2.  On amiodarone gtt + warfarin.   Diuresed with IV Lasix, creatinine 2.32 => 2.26 => 2.17. BP stable.    Objective:   Weight Range: 110.1 kg Body mass index is 38.02 kg/m.   Vital Signs:   Temp:  [98.1 F (36.7 C)-98.4 F (36.9 C)] 98.2 F (36.8 C) (04/07 0737) Pulse Rate:  [61-72] 67 (04/07 1013) Resp:  [20-21] 20 (04/07 0737) BP: (106-134)/(67-78) 134/77 (04/07 1013) SpO2:  [92 %-100 %] 92 % (04/07 0737) Weight:  [110.1 kg] 110.1 kg (04/07 0421) Last BM Date : 08/08/22  Weight change: Filed Weights   08/07/22 0934 08/09/22 0421  Weight: 109.8 kg 110.1 kg    Intake/Output:   Intake/Output Summary (Last 24 hours) at 08/09/2022 1126 Last data filed at 08/09/2022 0629 Gross per 24 hour  Intake 506.54 ml  Output 2540 ml  Net -2033.46 ml      Physical Exam    General: NAD Neck: JVP 7-8, no thyromegaly or thyroid nodule.  Lungs: Clear to auscultation bilaterally with normal respiratory effort. CV: Nondisplaced PMI.  Heart regular S1/S2, no S3/S4, no murmur.  1+ ankle edema.   Abdomen: Soft, nontender, no hepatosplenomegaly, no distention.  Skin: Intact without lesions or rashes.  Neurologic: Alert and oriented x 3.  Psych: Normal affect. Extremities: No clubbing or cyanosis.  HEENT: Normal.   Telemetry   NSR with BiV pacing (personally reviewed)  Labs    CBC Recent Labs    08/08/22 0128 08/09/22 0216  WBC 5.7 4.6  HGB 13.4 13.4  HCT 39.2 39.2  MCV 94.7 93.6  PLT 118* 122*   Basic Metabolic Panel Recent Labs    89/38/10 0128 08/09/22 0216  NA 138 138  K 3.9 3.8  CL 98 99  CO2 29 29  GLUCOSE 99 110*  BUN 41* 42*  CREATININE 2.26* 2.17*  CALCIUM 8.7* 9.2   Liver Function Tests Recent Labs     08/08/22 0128  AST 18  ALT 15  ALKPHOS 43  BILITOT 0.4  PROT 6.8  ALBUMIN 3.6   No results for input(s): "LIPASE", "AMYLASE" in the last 72 hours. Cardiac Enzymes No results for input(s): "CKTOTAL", "CKMB", "CKMBINDEX", "TROPONINI" in the last 72 hours.  BNP: BNP (last 3 results) Recent Labs    06/04/22 1453 08/04/22 1250 08/07/22 1050  BNP 78.8 155.1* 224.3*    ProBNP (last 3 results) No results for input(s): "PROBNP" in the last 8760 hours.   D-Dimer No results for input(s): "DDIMER" in the last 72 hours. Hemoglobin A1C Recent Labs    08/08/22 0128  HGBA1C 5.3   Fasting Lipid Panel No results for input(s): "CHOL", "HDL", "LDLCALC", "TRIG", "CHOLHDL", "LDLDIRECT" in the last 72 hours. Thyroid Function Tests No results for input(s): "TSH", "T4TOTAL", "T3FREE", "THYROIDAB" in the last 72 hours.  Invalid input(s): "FREET3"  Other results:   Imaging    No results found.   Medications:     Scheduled Medications:  carvedilol  6.25 mg Oral BID WC   empagliflozin  10 mg Oral Daily   insulin aspart  0-15 Units Subcutaneous TID WC   insulin aspart  0-5 Units Subcutaneous QHS   mometasone-formoterol  2 puff Inhalation BID   pantoprazole  40 mg Oral Daily   PHENobarbital  64.8 mg Oral BID   pravastatin  40 mg Oral q1800   spironolactone  12.5 mg Oral Daily   umeclidinium bromide  1 puff Inhalation Daily   warfarin  4.5 mg Oral ONCE-1600   Warfarin - Pharmacist Dosing Inpatient   Does not apply q1600    Infusions:  amiodarone 30 mg/hr (08/09/22 0623)    PRN Medications: acetaminophen, albuterol, melatonin, nitroGLYCERIN, ondansetron (ZOFRAN) IV   Assessment/Plan   1. Atrial fibrillation/flutter: Long history of atrial arrhythmias.  Had been maintaining NSR with amiodarone but was admitted with AF/RVR and CHF exacerbation.  Now s/p DCCV. Now back in NSR.  - Transition to amiodarone 200 mg bid x 2 wks then 200 mg daily.  - Continue warfarin 2. Acute  on chronic systolic CHF: Nonischemic cardiomyopathy, has St Jude CRT-P device.  TEE this admission showed mildly dilated LV with septal-lateral dyssynchrony and EF < 20%, moderate RV dysfunction.  Initially not BiV pacing due to rapid AFL, now back to BiV pacing.  Volume status looks stable today, creatinine mildly lower at 2.17.  - Restart torsemide tomorrow 40 mg daily.  - Continue Jardiance 10 mg daily - Continue Coreg 6.25 mg bid.   - spironolactone 12.5 daily - Creatinine trending down, restart Entresto 24/26 bid tomorrow.  3. AKI on CKD stage 3: Creatinine 2.3 => 2.26 => 2.19 today, baseline around 1.7.  Hopefully will improve with increased CO in NSR.  - Follow BMET closely.  4. Type 2 DM: SSI 5. COPD  OK for home today.  Close followup in CHF clinic, also needs coumadin clinic.  Cardiac meds for home: Warfarin, amiodarone 200 mg bid x 2 wks then 200 mg daily, Jardiance 10 daily, Coreg 6.25 mg bid, spironolactone 12.5 daily, start torsemide 40 mg daily tomorrow, start Entresto 24/26 bid tomorrow.   Length of Stay: 2  Marca Ancona, MD  08/09/2022, 11:26 AM  Advanced Heart Failure Team Pager [phone] (M-F; 7a - 5p)  Please contact [facility] for night-coverage after hours (5p -7a ) and weekends on [email or web address]

## 2022-08-09 NOTE — Plan of Care (Signed)

## 2022-08-09 NOTE — Progress Notes (Signed)
ANTICOAGULATION CONSULT NOTE - Follow Up  Pharmacy Consult for Warfarin Indication: atrial fibrillation  Allergies  Allergen Reactions   Calcium Channel Blockers Other (See Comments)    Came to hospital in 1995-caused chest pain     Patient Measurements: Height: 5\' 7"  (170.2 cm) Weight: 110.1 kg (242 lb 11.6 oz) IBW/kg (Calculated) : 66.1   Vital Signs: Temp: 98.2 F (36.8 C) (04/07 0737) Temp Source: Oral (04/07 0737) BP: 123/76 (04/07 0737) Pulse Rate: 61 (04/07 0737)  Labs: Recent Labs    08/07/22 0938 08/07/22 1013 08/07/22 1138 08/08/22 0128 08/09/22 0216  HGB 14.5  --   --  13.4 13.4  HCT 43.8  --   --  39.2 39.2  PLT 142*  --   --  118* 122*  LABPROT  --  24.1*  --  24.8* 23.9*  INR  --  2.2*  --  2.3* 2.2*  CREATININE 2.32*  --   --  2.26* 2.17*  TROPONINIHS 18*  --  18*  --   --      Estimated Creatinine Clearance: 33.2 mL/min (A) (by C-G formula based on SCr of 2.17 mg/dL (H)).   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 Newell Coral   Nocturia    Obesity    Pansinusitis    a.  complicated by brain abscess and bleeding requiring craniotomy in 1995.   PONV (postoperative nausea and vomiting)    Presence of permanent cardiac pacemaker     Sinusitis    s/p ethmoidectomy and nasal septoplasty   Vertigo    intermitantly    Assessment: 24 YOM admitted for cardioversion for atrial fibrillation, on warfarin prior to admission. Pharmacy consulted to dose warfarin.   Home regimen is 4.5mg  qSunday and 3mg  all other days.   INR 2.2 this morning. Plts 120s, Hgb 13s, no reports of signs/symptoms of bleeding per RN  Goal of Therapy:  INR 2-3 Monitor platelets by anticoagulation protocol: Yes   Plan:  Warfarin 4.5mg  PO x1 based on PTA regimen Daily INR and CBC Monitor for signs/symptoms of bleeding  Arabella Merles, PharmD. [facility] Acute Care PGY-1  08/09/2022 8:40 AM

## 2022-08-09 NOTE — Discharge Summary (Addendum)
Discharge Summary    Patient ID: Ricky Hayden MRN: [id number]; DOB: 1945-05-22  Admit date: 08/07/2022 Discharge date: 08/09/2022  PCP:  Clinic, [facility] Providers Cardiologist:  Bryan Lemma, MD  Electrophysiologist:  Sherryl Manges, MD   Discharge Diagnoses    Principal Problem:   Atrial flutter Active Problems:   Essential hypertension   COPD (chronic obstructive pulmonary disease)   Acute on chronic combined systolic and diastolic CHF (congestive heart failure)   Stage 3b chronic kidney disease (CKD)   NICM (nonischemic cardiomyopathy)   Presence of biventricular cardiac pacemaker   Acute on chronic respiratory failure with hypoxia and hypercapnia   Type 2 diabetes mellitus with chronic kidney disease, without long-term current use of insulin   Morbid obesity   Biventricular ICD (implantable cardioverter-defibrillator) in place   Hypercoagulable state due to paroxysmal atrial fibrillation   Atrial fibrillation with RVR   AKI (acute kidney injury)    Diagnostic Studies/Procedures    TEE 08/07/22: I do not see a final report  _____________  Echo 05/12/22: 1. Endocardial border definition is poor even with the use of Definitiy  contrast.   2. Left ventricular ejection fraction, by estimation, is 30 to 35%. The  left ventricle has moderately decreased function. The left ventricle  demonstrates global hypokinesis. There is moderate concentric left  ventricular hypertrophy. Left ventricular  diastolic parameters are consistent with Grade I diastolic dysfunction  (impaired relaxation).   3. Right ventricular systolic function is normal. The right ventricular  size is normal.   4. Right atrial size was moderately dilated.   5. The mitral valve is normal in structure. Trivial mitral valve  regurgitation. No evidence of mitral stenosis.   6. The aortic valve was not well visualized. Aortic valve regurgitation  is not visualized. No aortic  stenosis is present.   7. Aortic dilatation noted. There is moderate dilatation of the aortic  root, measuring 43 mm. There is moderate dilatation of the ascending  aorta, measuring 44 mm.   8. The inferior vena cava is normal in size with greater than 50%  respiratory variability, suggesting right atrial pressure of 3 mmHg.     History of Present Illness     Ricky Hayden is a 76 y.o. male with Afib/flutter on PO amiodarone, chronic systolic heart failure / NICM s/p St Jude CRT-P, CKD 3, DM2, COPD, and HTN. Heart cath 2021 with mild nonobstructive disease in the LAD and LCX.   He follows with AHF and EP teams. He has had several ER/admissions for RVR. He underwent TEE-DCCV Oct 2023, was pace-terminated by EP on another episode, and spontaneously converted on another visit. He was seen by Dr. Graciela Husbands 07/26/22 and felt not an ablation candidate. Due to maintaining sinus rhythm, amiodarone dose was reduced. Since then, he has noted a steady weight gain, LE edema, and ran out of jardiance.   He woke up the morning of 08/07/22 with chest heaviness, SOB, and rapid heart rate prompting ER evaluation. He is anticoagulated with coumadin but hx of subtherapeutic INRs. He was noted in Afib/flutter with RVR and started on amiodarone drip. He was evaluated by EP and AHF. He was diuresed, but entresto held for rising creatinine.    Hospital Course     Consultants:   Atrial flutter with RVR Hx of atrial arrhythmias on amiodarone Presented with RVR and CHF exacerbation. He was started on IV amiodarone. He underwent TEE-DCCV to sinus rhythm, BiV pacing.  Will continue  IV amiodarone until 3pm today. Then discontinue and start 200 mg amiodarone BID x 14 days, then 200 mg daily.  Have sent a message for close follow up with EP.   Chronic coumadin therapy This patients CHA2DS2-VASc Score is 5 INR 2.2 H/H stable Check INR early next week - I have messaged the coumadin clinic Wil need close follow up given  amiodarone load.   Acute on chronic systolic heart failure Nonischemic cardiomyopathy AoCKD 3 LVEF was 30-35% on echo in Jan 2024 TEE this admission with EF < 20% in rapid Afib He was diuresed, but complicated by AoCKD GDMT: jardiance, coreg, spironolactone Entresto held for rising creatinine. Creatinine trending down: 2.32 --> 2.17; baseline 1.6 Continue jardiance 10 mg, coreg 6.25 mg BID, spironolactone 12.5 mg,  Plan to restart entresto 24-26 mg BID and torsemide 40 mg daily tomorrow Scheduled for BMP on 08/11/22 Will message heart failure clinic for close follow up.    COPD Chronic respiratory failure On home O2   DM2 Pt was taking 12.5 mg (1/2 tablet of 25 mg jardiance). Will transition this to 10 mg daily.  A1c 5.3% this admission   CAD HS troponin 18 --> 18 No chest pain   Pt was seen and examined by Drs Nelly LaurenceMealor and Shirlee LatchMcLean and deemed stable for discharge. Plan to discharge at 3pm.  I have messaged the coumadin clinic, advanced heart failure clinic, and EP clinic for follow up appts.      Did the patient have an acute coronary syndrome (MI, NSTEMI, STEMI, etc) this admission?:  No                               Did the patient have a percutaneous coronary intervention (stent / angioplasty)?:  No.        The patient will be scheduled for a TOC follow up appointment in 7-14 days.  [facility] the office where the patient should be seen for follow up.  _____________  Discharge Vitals Blood pressure 134/77, pulse 67, temperature 98.2 F (36.8 C), temperature source Oral, resp. rate 20, height 5\' 7"  (1.702 m), weight 110.1 kg, SpO2 92 %.  Filed Weights   08/07/22 0934 08/09/22 0421  Weight: 109.8 kg 110.1 kg    Labs & Radiologic Studies    CBC Recent Labs    08/08/22 0128 08/09/22 0216  WBC 5.7 4.6  HGB 13.4 13.4  HCT 39.2 39.2  MCV 94.7 93.6  PLT 118* 122*   Basic Metabolic Panel Recent Labs     08/08/22 0128 08/09/22 0216  NA 138 138  K 3.9 3.8  CL 98 99  CO2 29 29  GLUCOSE 99 110*  BUN 41* 42*  CREATININE 2.26* 2.17*  CALCIUM 8.7* 9.2   Liver Function Tests Recent Labs    08/08/22 0128  AST 18  ALT 15  ALKPHOS 43  BILITOT 0.4  PROT 6.8  ALBUMIN 3.6   No results for input(s): "LIPASE", "AMYLASE" in the last 72 hours. High Sensitivity Troponin:   Recent Labs  Lab 08/07/22 0938 08/07/22 1138  TROPONINIHS 18* 18*    BNP Invalid input(s): "POCBNP" D-Dimer No results for input(s): "DDIMER" in the last 72 hours. Hemoglobin A1C Recent Labs    08/08/22 0128  HGBA1C 5.3   Fasting Lipid Panel No results for input(s): "CHOL", "HDL", "LDLCALC", "TRIG", "CHOLHDL", "LDLDIRECT" in the last 72  hours. Thyroid Function Tests No results for input(s): "TSH", "T4TOTAL", "T3FREE", "THYROIDAB" in the last 72 hours.  Invalid input(s): "FREET3" _____________  DG Chest Port 1 View  Result Date: 08/07/2022 CLINICAL DATA:  77 year old male with chest pain and shortness of breath. EXAM: PORTABLE CHEST 1 VIEW COMPARISON:  Chest radiographs 03/06/2022 and earlier. FINDINGS: Portable AP upright view at 1014 hours. Stable cardiomegaly, mediastinal contours, left chest cardiac pacemaker. Similar lordotic positioning. Allowing for portable technique the lungs are clear. No pneumothorax or pleural effusion. Visualized tracheal air column is within normal limits. Paucity of bowel gas in the visible abdomen. No acute osseous abnormality identified. IMPRESSION: Stable cardiomegaly. No acute cardiopulmonary abnormality. Electronically Signed   By: Odessa Fleming M.D.   On: 08/07/2022 10:24   CUP PACEART INCLINIC DEVICE CHECK  Result Date: 07/13/2022 CRT-P device check in clinic. Normal device function. Thresholds, sensing, impedance consistent with previous measurements. Histograms appropriate for patient and level of activity. Corvue indicating fluid on board and patient exhibits swelling to LLE.   Dr. Graciela Husbands made aware. AT/AF <1%. no high ventricular rate episodes.  Patient bi-ventricularly pacing 98% of the time. Device programmed with appropriate safety margins. Device heart failure diagnostics are within normal limits and stable over time. Estimated longevity 6.9 years. Patient enrolled in remote follow-up. Plan to check device remotely in 3 months and every 6 months in office.Syliva Overman, RN  Disposition   Pt is being discharged home today in good condition.  Follow-up Plans & Appointments     Follow-up Information     [facility] Follow up.   Specialty: Cardiology Why: office will call with INR appt - or you may call the above number. this can coincide with your labwork already scheduled on 08/11/22 Contact information: [address] [id number] [address] [place] [address] [phone]               Discharge Instructions     Diet - low sodium heart healthy   Complete by: As directed    Increase activity slowly   Complete by: As directed         Discharge Medications   Allergies as of 08/09/2022       Reactions   Calcium Channel Blockers Other (See Comments)   Came to hospital in 1995-caused chest pain        Medication List     TAKE these medications    acetaminophen 500 MG tablet Commonly known as: TYLENOL Take 1,000 mg by mouth every 6 (six) hours as needed for moderate pain.   albuterol 108 (90 Base) MCG/ACT inhaler Commonly known as: VENTOLIN HFA Inhale 2 puffs into the lungs every 6 (six) hours as needed for wheezing.   amiodarone 200 MG tablet Commonly known as: PACERONE Take 1 tablet (200 mg total) by mouth 2 (two) times daily for 14 days, THEN 1 tablet (200 mg total) daily. Start taking on: August 09, 2022 What changed: See the new instructions.   carvedilol 6.25 MG tablet Commonly known as: COREG TAKE 1 TABLET BY MOUTH TWICE A DAY   empagliflozin 10 MG Tabs tablet Commonly  known as: JARDIANCE Take 1 tablet (10 mg total) by mouth daily. Start taking on: August 10, 2022 What changed:  medication strength how much to take how to take this when to take this additional instructions   Entresto 24-26 MG Generic drug: sacubitril-valsartan Take 1 tablet by mouth 2 (two) times daily. Start 08/10/22 Start taking on: August 10, 2022 What changed: additional instructions   feeding supplement Liqd Take 237 mLs by mouth 2 (two) times daily between meals.   finasteride 5 MG tablet Commonly known as: PROSCAR TAKE 1/2 TABLET BY MOUTH DAILY   Melatonin 10 MG Caps Take 10 mg by mouth at bedtime as needed (sleep).   pantoprazole 40 MG tablet Commonly known as: PROTONIX Take 1 tablet (40 mg total) by mouth daily.   PHENobarbital 64.8 MG tablet Commonly known as: LUMINAL Take 1 tablet (64.8 mg total) by mouth 2 (two) times daily.   polyethylene glycol 17 g packet Commonly known as: MIRALAX / GLYCOLAX Take 17 g by mouth 2 (two) times daily. What changed: when to take this   pravastatin 40 MG tablet Commonly known as: PRAVACHOL TAKE 1 TABLET BY MOUTH EVERY DAY IN THE EVENING   PRESERVISION AREDS 2 PO Take 1 tablet by mouth in the morning and at bedtime.   Spiriva Respimat 2.5 MCG/ACT Aers Generic drug: Tiotropium Bromide Monohydrate Inhale 2 each into the lungs every evening.   spironolactone 25 MG tablet Commonly known as: ALDACTONE Take 0.5 tablets (12.5 mg total) by mouth daily. Start taking on: August 10, 2022   Torsemide 40 MG Tabs Take 40 mg by mouth daily. Start 08/10/22 Start taking on: August 10, 2022 What changed:  medication strength when to take this additional instructions   vitamin D3 25 MCG (1000 UT) tablet Generic drug: Cholecalciferol Take 1 tablet (1,000 Units total) by mouth daily.   warfarin 3 MG tablet Commonly known as: COUMADIN Take as directed. If you are unsure how to take this medication, talk to your nurse or doctor. Original  instructions: Take 1-1.5 tablets (3-4.5 mg total) by mouth See admin instructions. Take one tablet by mouth every day except on Sunday take 4.5 mg by mouth per son   Wixela Inhub 250-50 MCG/ACT Aepb Generic drug: fluticasone-salmeterol Inhale 1 puff into the lungs in the morning and at bedtime.           Outstanding Labs/Studies   Has several pending appts - I have messaged AHF, EP, and coumadin clinic.  BMP and INR early next week  Duration of Discharge Encounter   Greater than 30 minutes including physician time.  Signed, Roe Rutherford Eulalie Speights, PA 08/09/2022, 1:14 PM

## 2022-08-09 NOTE — Progress Notes (Signed)
Pt placed on nocturnal O2 ar 2L at HS (home regimen).  Sats noted to drop to 60 while asleep.  Upon entering room to assess, pt awakened with bedside alarming, stated he was sleeping good, denied any complaints.  Pt states he has never worn a cpap.  Sats 95% while awake.  Will continue to monitor closely.

## 2022-08-10 ENCOUNTER — Telehealth: Payer: Self-pay

## 2022-08-10 ENCOUNTER — Encounter (HOSPITAL_COMMUNITY): Payer: Self-pay | Admitting: Cardiology

## 2022-08-10 LAB — ECHO TEE: Est EF: <20

## 2022-08-10 NOTE — Telephone Encounter (Signed)
Received message below:  [facility], Roe Rutherford, PA  P Cv Div Nl Anticoag; P Cv Div Nl Triage Can you please schedule this patient for an INR check early next week? Discharging today, post DCCV, hx of subtherapeutic INR.  Thanks [employer] pt and made him aware of the need to check INR this week per Micah Flesher, PA due to starting Amio during recent hospital admission.  Pt stated he would need a morning appt to have transportation to labs labs drawn at [facility] as well. Coumadin Clinic appt scheduled for tomorrow, 08/11/22 at 11am. I emphasized to the pt the importance of coming to have INR checked. Pt verbalized understanding.

## 2022-08-11 ENCOUNTER — Ambulatory Visit: Payer: No Typology Code available for payment source | Attending: Cardiovascular Disease | Admitting: *Deleted

## 2022-08-11 ENCOUNTER — Other Ambulatory Visit (HOSPITAL_COMMUNITY): Payer: No Typology Code available for payment source

## 2022-08-11 DIAGNOSIS — Z5181 Encounter for therapeutic drug level monitoring: Secondary | ICD-10-CM

## 2022-08-11 DIAGNOSIS — I4892 Unspecified atrial flutter: Secondary | ICD-10-CM

## 2022-08-11 DIAGNOSIS — I4891 Unspecified atrial fibrillation: Secondary | ICD-10-CM | POA: Diagnosis not present

## 2022-08-11 LAB — POCT INR: INR: 2.4 (ref 2.0–3.0)

## 2022-08-11 NOTE — Patient Instructions (Signed)
Description   Continue warfarin taking 1 tablet daily except 1.5 tablets on Sundays and Wednesdays. Recheck INR in 1 week-Amio dose increased.  Anticoagulation Clinic [phone]

## 2022-08-17 ENCOUNTER — Other Ambulatory Visit (HOSPITAL_COMMUNITY): Payer: No Typology Code available for payment source

## 2022-08-18 ENCOUNTER — Ambulatory Visit
Admission: RE | Admit: 2022-08-18 | Discharge: 2022-08-18 | Disposition: A | Discharge status: Home / Self Care | Payer: No Typology Code available for payment source | Source: Ambulatory Visit | Attending: Internal Medicine | Admitting: Internal Medicine

## 2022-08-18 DIAGNOSIS — I5022 Chronic systolic (congestive) heart failure: Secondary | ICD-10-CM | POA: Diagnosis not present

## 2022-08-18 LAB — BASIC METABOLIC PANEL WITH GFR
Anion gap: 11 (ref 5–15)
BUN: 35 mg/dL — ABNORMAL HIGH (ref 8–23)
CO2: 32 mmol/L (ref 22–32)
Calcium: 10 mg/dL (ref 8.9–10.3)
Chloride: 98 mmol/L (ref 98–111)
Creatinine, Ser: 2.17 mg/dL — ABNORMAL HIGH (ref 0.61–1.24)
GFR, Estimated: 30 mL/min — ABNORMAL LOW (ref >60)
Glucose, Bld: 82 mg/dL (ref 70–99)
Potassium: 5.1 mmol/L (ref 3.5–5.1)
Sodium: 141 mmol/L (ref 135–145)

## 2022-08-19 ENCOUNTER — Ambulatory Visit: Payer: No Typology Code available for payment source

## 2022-08-19 ENCOUNTER — Telehealth: Payer: Self-pay | Admitting: *Deleted

## 2022-08-19 NOTE — Telephone Encounter (Signed)
Called pt since he missed his Anticoagulation Appt today. Left a message to call back to reschedule.

## 2022-08-24 ENCOUNTER — Telehealth (HOSPITAL_COMMUNITY): Payer: Self-pay

## 2022-08-24 DIAGNOSIS — I4819 Other persistent atrial fibrillation: Secondary | ICD-10-CM | POA: Insufficient documentation

## 2022-08-24 DIAGNOSIS — I447 Left bundle-branch block, unspecified: Secondary | ICD-10-CM | POA: Insufficient documentation

## 2022-08-24 NOTE — Telephone Encounter (Signed)
Called to confirm/remind patient of their appointment at the Advanced Heart Failure Clinic on 08/25/22.   Patient reminded to bring all medications and/or complete list.  Confirmed patient has transportation. Gave directions, instructed to utilize valet parking.  Confirmed appointment prior to ending call.

## 2022-08-24 NOTE — Progress Notes (Signed)
ADVANCED HF CLINIC NOTE   PCP: [facility] EP: Dr. Graciela Husbands HF Cardiologist: Dr. 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    EF 45-50% in 2016, 30-35% in 2017 and 2020.   CM previously felt to be d/t combination of LBBB and recurrent AF.   Admitted 4/10 to 08/09/19 with COVID PNA and HF. Echo repeated and EF back down to 20% w/ severe RV dysfunction. He recovered from COVID but unfortunately, his wife died from COVID.    Underwent CRT-P in September 2021.   He was admitted to [facility] in 12/22 with a/c CHF and atrial flutter. Underwent successful DCCV. Readmitted in 04/23 with acute respiratory failure, urosepsis and proteus bacteremia. Echo during admit with EF 40-45%. Course further c/b brief PEA arrest, aspiration PNA/HCAP, and atrial flutter with RVR. Underwent successful DCCV. EF 30-35% on TEE.    Admitted again in June 2023 with pyelonephritis and Jardiance stopped. Had afib with RVR but converted to SR spontaneously.    Admitted 9/23 with acute hypoxic respiratory failure secondary to a/c CHF and AF with RVR. Started on IV amiodarone and IV lasix.  Underwent TEE guided DCCV to SR. TEE demonstrated EF 20-25%, RV severely reduced, moderate TR.  Transient hypotension post TEE requiring pressors. Drips weaned and GDMT restarted.  Discharged home, weight 223 lbs.  Seen for hospital f/u 02/12/22. Low-dose Entresto restarted.   Seen in ED 02/24/22 with atrial flutter with RVR. EP was consulted. Successfully converted to SR with overdriving pacing. Amiodarone increased to 200 mg BID.  Note felt to be good candidate for PVI ablation. Planning to see Dr. Graciela Husbands next month to see if potential candidate for high risk PVI ablation.  Returned to ED 03/06/22 with recurrent atrial  flutter. Cardioversion planned but converted to SR while in ED.  Follow up 11/23, volume stable with NYHA II-III and volume stable. Confusion over medications, but he declined paramedicine.   Echo 05/12/22 EF 30-35%   Follow up with EP 3/24, holding off on ablation of PVI or AV node. Amio decreased to 300 mg daily. Volume overloaded, K 5.3 so spiro stopped and torsemide increased to 40 x 3 days.  Follow up 4/24, volume overloaded, off Jardiance. SGLT2i restarted, torsemide increased to 40 daily.  Admitted 4/24 with CP and palpitations, ECG showed WCT HR 130's, also volume overloaded. Diuresed and started on amio gtt. INR subtherapeutic, underwent TEE/DCCV to NSR. GDMT limited by elevated SCr, Entresto held. Drips weaned, continued on po amiodarone and discharged home, weight 241.5 lbs.  Today he returns for post hospital HF follow up with his wife. Overall feeling fine. He has SOB walking further distances on flat ground. Denies palpitations, abnormal bleeding, CP, dizziness, edema, or PND/Orthopnea. Chronically sleeps reclined. Appetite ok. No fever or chills. Weight at home 240 pounds. Taking all medications. BP at home 110's, wife checks his BP daily.  Cardiac Studies  - TEE/DCCV (4/24): EF < 20%, septal-lateral dyssynchrony consistent with LBBB, RV moderately down  - Echo (1/24): EF 30-35%  - TEE (10/23): EF 20-25%, RV severely reduced, moderate TR, no LAA  - Echo (4/23): EF 40-45%, LV mildly decreased, grade I DD, RV ok  - Echo (4/21): EF 20%, severe RV dysfunction (had COVID PNA)  - Echo (2020): EF 30-35%  - Echo (2017): EF 30-35%  -  Echo (2016): EF 45-50%  Past Medical History:  Diagnosis Date   Arthritis    osteoarthritis of left knee   Ascending aorta dilatation    Balanitis    recurrent   Cardiac arrhythmia    life threatening, secondary to CCB vs b- blockers   Cardiomyopathy    Chronic joint pain    Chronic systolic CHF (congestive heart failure)    CKD (chronic  kidney disease), stage III    COPD (chronic obstructive pulmonary disease)    Coronary artery disease    Dilated aortic root    Dyspnea    Enlarged prostate    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 Newell Coral   Nocturia    Obesity    Pansinusitis    a.  complicated by brain abscess and bleeding requiring craniotomy in 1995.   PONV (postoperative nausea and vomiting)    Presence of permanent cardiac pacemaker    Sinusitis    s/p ethmoidectomy and nasal septoplasty   Vertigo    intermitantly   Current Outpatient Medications  Medication Sig Dispense Refill   acetaminophen (TYLENOL) 500 MG tablet Take 1,000 mg by mouth every 6 (six) hours as needed for moderate pain.     albuterol (PROVENTIL HFA;VENTOLIN HFA) 108 (90 BASE) MCG/ACT inhaler Inhale 2 puffs into the lungs every 6 (six) hours as needed for wheezing. 1 Inhaler 11   amiodarone (PACERONE) 200 MG tablet Take 200 mg by mouth daily.     carvedilol (COREG) 6.25 MG tablet TAKE 1 TABLET BY MOUTH TWICE A DAY 180 tablet 1   empagliflozin (JARDIANCE) 10 MG TABS tablet Take 1 tablet (10 mg total) by mouth daily. 90 tablet 3   feeding supplement (ENSURE ENLIVE / ENSURE PLUS) LIQD Take 237 mLs by mouth 2 (two) times daily between meals. 10000 mL 0   finasteride (PROSCAR) 5 MG tablet TAKE 1/2 TABLET BY MOUTH DAILY 30 tablet 0   fluticasone-salmeterol (WIXELA INHUB) 250-50 MCG/ACT AEPB Inhale 1 puff into the lungs in the morning and at bedtime.     Melatonin 10 MG CAPS Take 10 mg by mouth at bedtime as needed (sleep).     Multiple Vitamins-Minerals (PRESERVISION AREDS 2 PO) Take 1 tablet by mouth in the morning and at bedtime.     pantoprazole (PROTONIX) 40 MG tablet Take 1 tablet (40 mg total) by mouth daily. 30 tablet 0    PHENobarbital (LUMINAL) 64.8 MG tablet Take 1 tablet (64.8 mg total) by mouth 2 (two) times daily. 180 tablet 3   polyethylene glycol (MIRALAX / GLYCOLAX) 17 g packet Take 17 g by mouth 2 (two) times daily. (Patient taking differently: Take 17 g by mouth daily.) 60 each 1   pravastatin (PRAVACHOL) 40 MG tablet TAKE 1 TABLET BY MOUTH EVERY DAY IN THE EVENING 90 tablet 0   sacubitril-valsartan (ENTRESTO) 24-26 MG Take 1 tablet by mouth 2 (two) times daily. Start 08/10/22 60 tablet 8   spironolactone (ALDACTONE) 25 MG tablet Take 0.5 tablets (12.5 mg total) by mouth daily. 90 tablet 3   Tiotropium Bromide Monohydrate (SPIRIVA RESPIMAT) 2.5 MCG/ACT AERS Inhale 2 each into the lungs every evening.     torsemide 40 MG TABS Take 40 mg by  mouth daily. Start 08/10/22 90 tablet 3   Vitamin D3 (VITAMIN D) 25 MCG tablet Take 1 tablet (1,000 Units total) by mouth daily. 60 tablet 0   warfarin (COUMADIN) 3 MG tablet Take 1-1.5 tablets (3-4.5 mg total) by mouth See admin instructions. Take one tablet by mouth every day except on Sunday take 4.5 mg by mouth per son 30 tablet 1   No current facility-administered medications for this encounter.   Allergies  Allergen Reactions   Calcium Channel Blockers Other (See Comments)    Came to hospital in 1995-caused chest pain    Social History   Socioeconomic History   Marital status: Widowed    Spouse name: Not on file   Number of children: Not on file   Years of education: Not on file   Highest education level: Not on file  Occupational History   Not on file  Tobacco Use   Smoking status: Former    Types: Cigarettes    Quit date: 05/04/1986    Years since quitting: 36.3   Smokeless tobacco: Never   Tobacco comments:    Former smoker 02/12/22  Vaping Use   Vaping Use: Never used  Substance and Sexual Activity   Alcohol use: Not Currently   Drug use: No   Sexual activity: Yes  Other Topics Concern   Not on file  Social History Narrative   Not on file    Social Determinants of Health   Financial Resource Strain: Not on file  Food Insecurity: No Food Insecurity (02/01/2022)   Hunger Vital Sign    Worried About Running Out of Food in the Last Year: Never true    Ran Out of Food in the Last Year: Never true  Transportation Needs: No Transportation Needs (02/01/2022)   PRAPARE - Administrator, Civil Service (Medical): No    Lack of Transportation (Non-Medical): No  Physical Activity: Not on file  Stress: Not on file  Social Connections: Not on file  Intimate Partner Violence: Not At Risk (02/01/2022)   Humiliation, Afraid, Rape, and Kick questionnaire    Fear of Current or Ex-Partner: No    Emotionally Abused: No    Physically Abused: No    Sexually Abused: No   Family History  Problem Relation Age of Onset   Heart failure Mother 53   Heart attack Father 58   BP (!) 148/88   Pulse 71   Wt 111.2 kg (245 lb 3.2 oz)   SpO2 96%   BMI 38.40 kg/m   Wt Readings from Last 3 Encounters:  08/25/22 111.2 kg (245 lb 3.2 oz)  08/09/22 110.1 kg (242 lb 11.6 oz)  08/04/22 112.4 kg (247 lb 12.8 oz)   PHYSICAL EXAM: General:  NAD. No resp difficulty, walked into clinic with RW HEENT: Normal Neck: Supple. No JVD, thick neck. Carotids 2+ bilat; no bruits. No lymphadenopathy or thryomegaly appreciated. Cor: PMI nondisplaced. Regular rate & rhythm. No rubs, gallops or murmurs. Lungs: Clear Abdomen: Soft, nontender, +distended. No hepatosplenomegaly. No bruits or masses. Good bowel sounds. Extremities: No cyanosis, clubbing, rash, 1+ pretibial LLE edema Neuro: Alert & oriented x 3, cranial nerves grossly intact. Moves all 4 extremities w/o difficulty. Affect pleasant.  ECG (personally reviewed): a sensed v paced, 70 bpm  ASSESSMENT & PLAN: 1. Atrial fibrillation/flutter  - Multiple previous admits with Afib and flutter - S/p TEE/DCCV to SR in 10/23. Had recurrent AFL and had overdrive pacing in ED 02/24/22  (saw Dr. Graciela Husbands  3/24) - not felt to be candidate for PVI ablation or AV node ablation - CHA2DS2-VASc score is 6. - On coumadin. Not on DOAC d/t interaction with phenobarbital use (see below). - Had been maintaining NSR with amiodarone but was admitted with AF/RVR and CHF exacerbation.   - Now s/p TEE/ DCCV (4/24). Now back in NSR.  - Continue amiodarone 200 mg daily. - Continue warfarin. INR followed by [facility]. - He has follow up with Dr. Graciela Husbands today. - Check INR today and forward to [facility] - Not interested in sleep study, wears 2L oxygen at night.  2. Chronic systolic CHF  - cardiomyopathy dates back to at least 2016.  - EF variable over the years. CM previously thought to be d/t LBBB +/- recurrent AF.  - S/p CRT-P (9/21). - Multiple admissions for a/c CHF and recurrent AF this past year - Echo (2021): EF < 20% . LHC with no significant CAD. - Echo (4/23): EF 40-45% - Echo (09/26/21): EF 30-35% in setting of AF - TEE (10/23): EF 20-25%, RV severely reduced, severe BAE, moderate TR - Echo (1/24): EF 30-35% - TEE (4/24): EF < 20% (in setting of AF RVR). - NYHA II-early III, function class confounded by body habitus. Volume OK today. - Continue torsemide 40 mg daily. - Continue Jardiance 10 mg daily. - Continue Coreg 6.25 mg bid.   - Continue spironolactone 12.5 daily - Continue Entresto 24/26 mg bid. - Labs today. - Repeat echo in a couple months now that he is back in SR.  3. CKD stage 3 - Baseline SCr 2.3 - On SGLT2i - BMET today  4. H/o brain abscess - S/p craniotomy 2015 - Has been on phenobarbital since 1995, no seizures.  Follow up in 2-3 months with Dr. Gala Romney + echo  Jacklynn Ganong, FNP  9:39 AM

## 2022-08-25 ENCOUNTER — Encounter (HOSPITAL_COMMUNITY): Payer: Self-pay

## 2022-08-25 ENCOUNTER — Ambulatory Visit (INDEPENDENT_AMBULATORY_CARE_PROVIDER_SITE_OTHER): Payer: No Typology Code available for payment source | Admitting: Internal Medicine

## 2022-08-25 ENCOUNTER — Telehealth: Payer: Self-pay | Admitting: Internal Medicine

## 2022-08-25 ENCOUNTER — Encounter: Payer: Self-pay | Admitting: Internal Medicine

## 2022-08-25 ENCOUNTER — Ambulatory Visit (INDEPENDENT_AMBULATORY_CARE_PROVIDER_SITE_OTHER): Payer: No Typology Code available for payment source

## 2022-08-25 ENCOUNTER — Ambulatory Visit
Admission: RE | Admit: 2022-08-25 | Discharge: 2022-08-25 | Disposition: A | Discharge status: Home / Self Care | Payer: No Typology Code available for payment source | Source: Ambulatory Visit | Attending: Family Medicine | Admitting: Family Medicine

## 2022-08-25 VITALS — BP 126/74 | HR 86 | Ht 67.0 in | Wt 242.4 lb

## 2022-08-25 VITALS — BP 148/88 | HR 71 | Wt 245.2 lb

## 2022-08-25 DIAGNOSIS — I5022 Chronic systolic (congestive) heart failure: Secondary | ICD-10-CM

## 2022-08-25 DIAGNOSIS — I428 Other cardiomyopathies: Secondary | ICD-10-CM | POA: Insufficient documentation

## 2022-08-25 DIAGNOSIS — I4891 Unspecified atrial fibrillation: Secondary | ICD-10-CM | POA: Diagnosis not present

## 2022-08-25 DIAGNOSIS — Z8616 Personal history of COVID-19: Secondary | ICD-10-CM | POA: Insufficient documentation

## 2022-08-25 DIAGNOSIS — Z5181 Encounter for therapeutic drug level monitoring: Secondary | ICD-10-CM | POA: Diagnosis not present

## 2022-08-25 DIAGNOSIS — Z8661 Personal history of infections of the central nervous system: Secondary | ICD-10-CM | POA: Insufficient documentation

## 2022-08-25 DIAGNOSIS — E1122 Type 2 diabetes mellitus with diabetic chronic kidney disease: Secondary | ICD-10-CM | POA: Insufficient documentation

## 2022-08-25 DIAGNOSIS — Z79899 Other long term (current) drug therapy: Secondary | ICD-10-CM | POA: Diagnosis not present

## 2022-08-25 DIAGNOSIS — N1831 Chronic kidney disease, stage 3a: Secondary | ICD-10-CM | POA: Diagnosis not present

## 2022-08-25 DIAGNOSIS — I447 Left bundle-branch block, unspecified: Secondary | ICD-10-CM | POA: Diagnosis not present

## 2022-08-25 DIAGNOSIS — Z7984 Long term (current) use of oral hypoglycemic drugs: Secondary | ICD-10-CM | POA: Insufficient documentation

## 2022-08-25 DIAGNOSIS — R0602 Shortness of breath: Secondary | ICD-10-CM | POA: Insufficient documentation

## 2022-08-25 DIAGNOSIS — Z95810 Presence of automatic (implantable) cardiac defibrillator: Secondary | ICD-10-CM

## 2022-08-25 DIAGNOSIS — G060 Intracranial abscess and granuloma: Secondary | ICD-10-CM

## 2022-08-25 DIAGNOSIS — I440 Atrioventricular block, first degree: Secondary | ICD-10-CM | POA: Diagnosis not present

## 2022-08-25 DIAGNOSIS — N183 Chronic kidney disease, stage 3 unspecified: Secondary | ICD-10-CM | POA: Diagnosis not present

## 2022-08-25 DIAGNOSIS — Z8679 Personal history of other diseases of the circulatory system: Secondary | ICD-10-CM | POA: Diagnosis not present

## 2022-08-25 DIAGNOSIS — I4892 Unspecified atrial flutter: Secondary | ICD-10-CM

## 2022-08-25 DIAGNOSIS — I4819 Other persistent atrial fibrillation: Secondary | ICD-10-CM

## 2022-08-25 DIAGNOSIS — J440 Chronic obstructive pulmonary disease with acute lower respiratory infection: Secondary | ICD-10-CM | POA: Insufficient documentation

## 2022-08-25 DIAGNOSIS — Z7901 Long term (current) use of anticoagulants: Secondary | ICD-10-CM | POA: Diagnosis not present

## 2022-08-25 DIAGNOSIS — Z9981 Dependence on supplemental oxygen: Secondary | ICD-10-CM | POA: Insufficient documentation

## 2022-08-25 DIAGNOSIS — I130 Hypertensive heart and chronic kidney disease with heart failure and stage 1 through stage 4 chronic kidney disease, or unspecified chronic kidney disease: Secondary | ICD-10-CM | POA: Diagnosis not present

## 2022-08-25 DIAGNOSIS — I5082 Biventricular heart failure: Secondary | ICD-10-CM | POA: Diagnosis not present

## 2022-08-25 LAB — BASIC METABOLIC PANEL
Anion gap: 9 (ref 5–15)
BUN: 38 mg/dL — ABNORMAL HIGH (ref 8–23)
CO2: 31 mmol/L (ref 22–32)
Calcium: 9.9 mg/dL (ref 8.9–10.3)
Chloride: 105 mmol/L (ref 98–111)
Creatinine, Ser: 2.12 mg/dL — ABNORMAL HIGH (ref 0.61–1.24)
GFR, Estimated: 31 mL/min — ABNORMAL LOW (ref >60)
Glucose, Bld: 116 mg/dL — ABNORMAL HIGH (ref 70–99)
Potassium: 5.2 mmol/L — ABNORMAL HIGH (ref 3.5–5.1)
Sodium: 145 mmol/L (ref 135–145)

## 2022-08-25 LAB — PROTIME-INR
INR: 2.5 — ABNORMAL HIGH (ref 0.8–1.2)
Prothrombin Time: 27.1 seconds — ABNORMAL HIGH (ref 11.4–15.2)

## 2022-08-25 LAB — BRAIN NATRIURETIC PEPTIDE: B Natriuretic Peptide: 30.2 pg/mL (ref 0.0–100.0)

## 2022-08-25 NOTE — Progress Notes (Signed)
Medication Samples have been provided to the patient.  Drug name: Jardiance       Strength:         Qty: 4  LOT: [id number]  Exp.Date: 05/2024  Dosing instructions: TAKE 1 TAB PO QD  The patient has been instructed regarding the correct time, dose, and frequency of taking this medication, including desired effects and most common side effects.   Amiir Heckard R Dossie Ocanas 9:57 AM 08/25/2022

## 2022-08-25 NOTE — Telephone Encounter (Signed)
Pearl from [place] with [facility] called to see about having notes from today's office visit secure emailed to [email or web address] if possible but if not she would like them faxed over to [phone]. She stated you may call if any questions. Please advise

## 2022-08-25 NOTE — Patient Instructions (Addendum)
Medication Instructions:  Your physician recommends that you continue on your current medications as directed. Please refer to the Current Medication list given to you today.  *If you need a refill on your cardiac medications before your next appointment, please call your pharmacy*   Lab Work: None ordered.  If you have labs (blood work) drawn today and your tests are completely normal, you will receive your results only by: MyChart Message (if you have MyChart) OR A paper copy in the mail If you have any lab test that is abnormal or we need to change your treatment, we will call you to review the results.   Testing/Procedures: None ordered.    Follow-Up: At [facility], you and your health needs are our priority.  As part of our continuing mission to provide you with exceptional heart care, we have created designated Provider Care Teams.  These Care Teams include your primary Cardiologist (physician) and Advanced Practice Providers (APPs -  Physician Assistants and Nurse Practitioners) who all work together to provide you with the care you need, when you need it.  We recommend signing up for the patient portal called "MyChart".  Sign up information is provided on this After Visit Summary.  MyChart is used to connect with patients for Virtual Visits (Telemedicine).  Patients are able to view lab/test results, encounter notes, upcoming appointments, etc.  Non-urgent messages can be sent to your provider as well.   To learn more about what you can do with MyChart, go to [email or web address].    Your next appointment:   6 months with Dr Graciela Husbands   Dr Graciela Husbands will contact Dr Ladona Ridgel to discuss a cardiac Ablation

## 2022-08-25 NOTE — Progress Notes (Signed)
Patient Care Team: [facility] PCP - General Duke Salvia, MD as PCP - Electrophysiology (Cardiology) Marykay Lex, MD as PCP - Cardiology (Cardiology) Valentino Nose, MD (Inactive) as Resident (Internal Medicine)   HPI  Ricky Hayden is a 77 y.o. male seen in followup for CRT. St Jude implanted 9/21 I had failed an LV lead placement.  Dr. Ladona Ridgel undertook left bundle branch block area pacing with post implant QRS duration of 106 ms.  RV output is programmed subthreshold   Cardiac consultation note 08/03/2019 describes a low ejection fraction heart failure on and off since 2016.  Denies coronary disease or bypass surgery.  * Hospitalized 4/21 for COVID pneumonia    Numerous hospitalizations secondary to prolonged infections over the last year.Complicated by atrial fibrillation with rapid rates prompting cardioversion.  Again 9/23 started on amiodarone  10/23 presented to the hospital with atrial flutter with RVR successfully paced terminated.  Amiodarone was increased  Issues were raised as to dofetilide, AV nodal ablation in the context of his pacing, or A-fib ablation. Has had discussions regarding continuing amiodarone, AV ablation or PVI.  Most recently had decided to continue amiodarone.  4/24 admitted with acute congestive heart failure with atrial flutter with rapid rate.  Underwent TEE guided cardioversion, LV function was noted to be significantly depressed.        Date Cr K Hgb TSH  9//20 (scanned) 1.37 4.2 14.5    6/23 1.27 4.3    13.2 3.345  2/24 1.78 5.1        DATE TEST EF    10/17 Echo  30-35%    10/20 Echo   30-35 %     4/21 Echo  20% RV dysfn severe  5/21 LHC  Cors min obstruction  8/21 Echo  20-25% RV function normal   4/23 Echo  40-45%   9/23 Echo  20-25% RV function severely abnormal  1/24 Echo  30-35%   4/24 TEE <20%     ZIO  PVCs < 1%    Records and Results Reviewed   Past Medical History:  Diagnosis Date    Arthritis    osteoarthritis of left knee   Ascending aorta dilatation    Balanitis    recurrent   Cardiac arrhythmia    life threatening, secondary to CCB vs b- blockers   Cardiomyopathy    Chronic joint pain    Chronic systolic CHF (congestive heart failure)    CKD (chronic kidney disease), stage III    COPD (chronic obstructive pulmonary disease)    Coronary artery disease    Dilated aortic root    Dyspnea    Enlarged prostate    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 Newell Coral   Nocturia    Obesity    Pansinusitis    a.  complicated by brain abscess and bleeding requiring craniotomy in 1995.   PONV (postoperative nausea and vomiting)    Presence of permanent cardiac pacemaker    Sinusitis    s/p ethmoidectomy and nasal septoplasty   Vertigo    intermitantly    Past Surgical History:  Procedure Laterality Date   BIV UPGRADE N/A 01/29/2020   Procedure: BIV UPGRADE;  Surgeon: Marinus Maw, MD;  Location: [facility] INVASIVE CV LAB;  Service: Cardiovascular;  Laterality: N/A;   CARDIOVERSION N/A 09/26/2021   Procedure: CARDIOVERSION;  Surgeon: Chilton Si, MD;  Location: [facility] ENDOSCOPY;  Service: Cardiovascular;  Laterality: N/A;   CARDIOVERSION N/A 02/03/2022   Procedure: CARDIOVERSION;  Surgeon: Wendall Stade, MD;  Location: [facility] ENDOSCOPY;  Service: Cardiovascular;  Laterality: N/A;   CARDIOVERSION N/A 08/07/2022   Procedure: CARDIOVERSION;  Surgeon: Laurey Morale, MD;  Location: [facility] ENDOSCOPY;  Service: Cardiovascular;  Laterality: N/A;   CIRCUMCISION N/A 11/20/2013   Procedure: CIRCUMCISION ADULT;  Surgeon: Garnett Farm, MD;  Location: [facility] ORS;  Service: Urology;  Laterality: N/A;   CRANIOTOMY  1995   hematomy due to sinus infection     CYSTOSCOPY/URETEROSCOPY/HOLMIUM LASER/STENT PLACEMENT Left 12/23/2021   Procedure: LEFT URETEROSCOPY/HOLMIUM LASER LITHOTRIPSY/STENT PLACEMENT retrograde;  Surgeon: Despina Arias, MD;  Location: [facility] ORS;  Service: Urology;  Laterality: Left;  90 MINUTES NEEDED FOR CASE   PACEMAKER IMPLANT N/A 01/17/2020   Procedure: PACEMAKER IMPLANT;  Surgeon: Duke Salvia, MD;  Location: [facility] INVASIVE CV LAB;  Service: Cardiovascular;  Laterality: N/A;   RIGHT/LEFT HEART CATH AND CORONARY ANGIOGRAPHY N/A 09/20/2019   Procedure: RIGHT/LEFT HEART CATH AND CORONARY ANGIOGRAPHY;  Surgeon: Dolores Patty, MD;  Location: [facility] INVASIVE CV LAB;  Service: Cardiovascular;  Laterality: N/A;   shoulder surg rt   1995   SINUS SURGERY WITH INSTATRAK     ethmoidectomy and nasal septum repair   TEE WITHOUT CARDIOVERSION N/A 09/26/2021   Procedure: TRANSESOPHAGEAL ECHOCARDIOGRAM (TEE);  Surgeon: Chilton Si, MD;  Location: [facility] ENDOSCOPY;  Service: Cardiovascular;  Laterality: N/A;   TEE WITHOUT CARDIOVERSION N/A 02/03/2022   Procedure: TRANSESOPHAGEAL ECHOCARDIOGRAM (TEE);  Surgeon: Wendall Stade, MD;  Location[facility] ENDOSCOPY;  Service: Cardiovascular;  Laterality: N/A;   TEE WITHOUT CARDIOVERSION N/A 08/07/2022   Procedure: TRANSESOPHAGEAL ECHOCARDIOGRAM (TEE);  Surgeon: Laurey Morale, MD;  Location: [facility] ENDOSCOPY;  Service: Cardiovascular;  Laterality: N/A;    Current Meds  Medication Sig   acetaminophen (TYLENOL) 500 MG tablet Take 1,000 mg by mouth every 6 (six) hours as needed for moderate pain.   albuterol (PROVENTIL HFA;VENTOLIN HFA) 108 (90 BASE) MCG/ACT inhaler Inhale 2 puffs into the lungs every 6 (six) hours as needed for wheezing.   amiodarone (PACERONE) 200 MG tablet Take 200 mg by mouth daily.   carvedilol (COREG) 6.25 MG tablet TAKE 1 TABLET BY MOUTH TWICE A DAY   empagliflozin (JARDIANCE) 10 MG TABS tablet Take 1 tablet (10 mg total) by mouth daily.   feeding supplement (ENSURE ENLIVE / ENSURE PLUS)  LIQD Take 237 mLs by mouth 2 (two) times daily between meals.   finasteride (PROSCAR) 5 MG tablet TAKE 1/2 TABLET BY MOUTH DAILY   fluticasone-salmeterol (WIXELA INHUB) 250-50 MCG/ACT AEPB Inhale 1 puff into the lungs in the morning and at bedtime.   Melatonin 10 MG CAPS Take 10 mg by mouth at bedtime as needed (sleep).   Multiple Vitamins-Minerals (PRESERVISION AREDS 2 PO) Take 1 tablet by mouth in the morning and at bedtime.   pantoprazole (PROTONIX) 40 MG tablet Take 1 tablet (40 mg total) by mouth daily.   PHENobarbital (LUMINAL) 64.8 MG tablet Take 1 tablet (64.8 mg total) by mouth 2 (two) times daily.   polyethylene glycol (MIRALAX / GLYCOLAX) 17 g packet Take 17 g by mouth 2 (two) times daily. (Patient taking differently: Take 17 g by mouth daily.)   pravastatin (PRAVACHOL)  40 MG tablet TAKE 1 TABLET BY MOUTH EVERY DAY IN THE EVENING   sacubitril-valsartan (ENTRESTO) 24-26 MG Take 1 tablet by mouth 2 (two) times daily. Start 08/10/22   spironolactone (ALDACTONE) 25 MG tablet Take 0.5 tablets (12.5 mg total) by mouth daily.   Tiotropium Bromide Monohydrate (SPIRIVA RESPIMAT) 2.5 MCG/ACT AERS Inhale 2 each into the lungs every evening.   torsemide 40 MG TABS Take 40 mg by mouth daily. Start 08/10/22   Vitamin D3 (VITAMIN D) 25 MCG tablet Take 1 tablet (1,000 Units total) by mouth daily.   warfarin (COUMADIN) 3 MG tablet Take 1-1.5 tablets (3-4.5 mg total) by mouth See admin instructions. Take one tablet by mouth every day except on Sunday take 4.5 mg by mouth per son  Med list was reviewed unfortunately was not included in this note  Allergies  Allergen Reactions   Calcium Channel Blockers Other (See Comments)    Came to hospital in 1995-caused chest pain       Review of Systems negative except from HPI and PMH  Physical Exam BP 126/74   Pulse 86   Ht 5\' 7"  (1.702 m)   Wt 242 lb 6.4 oz (110 kg)   SpO2 96%   BMI 37.97 kg/m  Well developed and well nourished in no acute  distress HENT normal Neck supple with JVP-8 Clear Device pocket well healed; without hematoma or erythema.  There is no tethering  Regular rate and rhythm, no  gallop No  / murmur Abd-soft with active BS No Clubbing cyanosis  edema Skin-warm and dry A & Oriented  Grossly normal sensory and motor function  ECG sinus rhythm with frequent PACs and P synchronous pacing with a QRSd of 112 ms  Device function is normal. Programming changes none  See Paceart for details     Assessment and  Plan  Nonischemic cardiomyopathy  Atrial fibrillation/flutter persistent with rapid ventricular response triggering heart failure  Left bundle branch block/first-degree AV block   Congestive heart failure-chronic-systolic class III  Renal insufficiency   COPD-oxygen dependent  RV function/dysfunction-variable echoes  CRTP-Saint Jude  The patient's atrial fibrillation/flutter has been recurring, associated with a rapid rate and more recently significant change in LV systolic function.  I am inclined towards proceeding with AV junction ablation at.  He saw Dr. Merita Norton in the hospital who was not enthused about PVI.  I will reach out to Dr. Leonia Reeves as he was the one who implanted the pacemaker.    For now we will continue amiodarone 200 mg a day and anticoagulation with warfarin

## 2022-08-25 NOTE — Patient Instructions (Signed)
EKG done today.  Labs done today. We will contact you only if your labs are abnormal.  No medication changes were made. Please continue all current medications as prescribed.  Your physician recommends that you schedule a follow-up appointment in: 2-3 months with Dr. Gala Romney with an echo prior to your appointment. Please contact our office in May to schedule a June appointment.   Your physician has requested that you have an echocardiogram. Echocardiography is a painless test that uses sound waves to create images of your heart. It provides your doctor with information about the size and shape of your heart and how well your heart's chambers and valves are working. This procedure takes approximately one hour. There are no restrictions for this procedure. Please do NOT wear cologne, perfume, aftershave, or lotions (deodorant is allowed). Please arrive 15 minutes prior to your appointment time.  If you have any questions or concerns before your next appointment please send [place] a message through [place] or call our office at [phone].    TO LEAVE A MESSAGE FOR THE NURSE SELECT OPTION 2, PLEASE LEAVE A MESSAGE INCLUDING: YOUR NAME DATE OF BIRTH CALL BACK NUMBER REASON FOR CALL**this is important as we prioritize the call backs  YOU WILL RECEIVE A CALL BACK THE SAME DAY AS LONG AS YOU CALL BEFORE 4:00 PM   Do the following things EVERYDAY: Weigh yourself in the morning before breakfast. Write it down and keep it in a log. Take your medicines as prescribed Eat low salt foods--Limit salt (sodium) to 2000 mg per day.  Stay as active as you can everyday Limit all fluids for the day to less than 2 liters   At the [facility], you and your health needs are our priority. As part of our continuing mission to provide you with exceptional heart care, we have created designated Provider Care Teams. These Care Teams include your primary Cardiologist (physician) and Advanced  Practice Providers (APPs- Physician Assistants and Nurse Practitioners) who all work together to provide you with the care you need, when you need it.   You may see any of the following providers on your designated Care Team at your next follow up: Dr Arvilla Meres Dr Marca Ancona Dr. Marcos Eke, NP Robbie Lis, [name] [facility] [place], [name] Brynda Peon, NP Karle Plumber, PharmD   Please be sure to bring in all your medications bottles to every appointment.    Thank you for choosing [facility]

## 2022-08-25 NOTE — Patient Instructions (Signed)
Description   Continue warfarin taking 1 tablet daily except 1.5 tablets on Sundays and Wednesdays. Recheck INR in 2 weeks-Amio dose increased.  Anticoagulation Clinic [phone]

## 2022-08-26 ENCOUNTER — Telehealth (HOSPITAL_COMMUNITY): Payer: Self-pay

## 2022-08-26 ENCOUNTER — Ambulatory Visit: Payer: No Typology Code available for payment source

## 2022-08-26 DIAGNOSIS — I5022 Chronic systolic (congestive) heart failure: Secondary | ICD-10-CM

## 2022-08-26 NOTE — Telephone Encounter (Signed)
Patient aware of labs.  Repeat labs scheduled and ordered. Verbalized understanding of medication changes.

## 2022-08-30 ENCOUNTER — Other Ambulatory Visit: Payer: Self-pay | Admitting: Physician Assistant

## 2022-08-30 DIAGNOSIS — I480 Paroxysmal atrial fibrillation: Secondary | ICD-10-CM

## 2022-08-31 NOTE — Telephone Encounter (Signed)
Warfarin refill Afib Last INR 08/25/22 Last OV 08/25/22

## 2022-09-03 ENCOUNTER — Ambulatory Visit (HOSPITAL_COMMUNITY)
Admission: RE | Admit: 2022-09-03 | Discharge: 2022-09-03 | Disposition: A | Discharge status: Home / Self Care | Payer: No Typology Code available for payment source | Source: Ambulatory Visit | Attending: Cardiology | Admitting: Cardiology

## 2022-09-03 DIAGNOSIS — I5022 Chronic systolic (congestive) heart failure: Secondary | ICD-10-CM | POA: Diagnosis present

## 2022-09-03 LAB — BASIC METABOLIC PANEL
Anion gap: 7 (ref 5–15)
BUN: 34 mg/dL — ABNORMAL HIGH (ref 8–23)
CO2: 30 mmol/L (ref 22–32)
Calcium: 9.4 mg/dL (ref 8.9–10.3)
Chloride: 102 mmol/L (ref 98–111)
Creatinine, Ser: 2.02 mg/dL — ABNORMAL HIGH (ref 0.61–1.24)
GFR, Estimated: 33 mL/min — ABNORMAL LOW (ref >60)
Glucose, Bld: 95 mg/dL (ref 70–99)
Potassium: 4.5 mmol/L (ref 3.5–5.1)
Sodium: 139 mmol/L (ref 135–145)

## 2022-09-08 ENCOUNTER — Ambulatory Visit: Payer: No Typology Code available for payment source

## 2022-09-10 ENCOUNTER — Ambulatory Visit: Payer: No Typology Code available for payment source | Attending: Internal Medicine | Admitting: Internal Medicine

## 2022-09-10 ENCOUNTER — Encounter: Payer: Self-pay | Admitting: Internal Medicine

## 2022-09-10 VITALS — BP 120/62 | HR 74 | Ht 67.0 in | Wt 240.0 lb

## 2022-09-10 DIAGNOSIS — I480 Paroxysmal atrial fibrillation: Secondary | ICD-10-CM

## 2022-09-10 DIAGNOSIS — Z950 Presence of cardiac pacemaker: Secondary | ICD-10-CM

## 2022-09-10 DIAGNOSIS — I447 Left bundle-branch block, unspecified: Secondary | ICD-10-CM | POA: Diagnosis not present

## 2022-09-10 DIAGNOSIS — I428 Other cardiomyopathies: Secondary | ICD-10-CM

## 2022-09-10 LAB — CUP PACEART INCLINIC DEVICE CHECK
Battery Remaining Longevity: 79 mo
Battery Voltage: 2.99 V
Brady Statistic RA Percent Paced: 33 %
Brady Statistic RV Percent Paced: 99.15 %
Date Time Interrogation Session: 20240509204009
Implantable Lead Connection Status: 753985
Implantable Lead Connection Status: 753985
Implantable Lead Connection Status: 753985
Implantable Lead Implant Date: 20210915
Implantable Lead Implant Date: 20210915
Implantable Lead Implant Date: 20210927
Implantable Lead Location: 753858
Implantable Lead Location: 753859
Implantable Lead Location: 753860
Implantable Lead Model: 3830
Implantable Pulse Generator Implant Date: 20210915
Lead Channel Impedance Value: 462.5 Ohm
Lead Channel Impedance Value: 512.5 Ohm
Lead Channel Impedance Value: 550 Ohm
Lead Channel Pacing Threshold Amplitude: 0.75 V
Lead Channel Pacing Threshold Amplitude: 0.75 V
Lead Channel Pacing Threshold Amplitude: 0.75 V
Lead Channel Pacing Threshold Amplitude: 0.75 V
Lead Channel Pacing Threshold Amplitude: 0.75 V
Lead Channel Pacing Threshold Amplitude: 0.75 V
Lead Channel Pacing Threshold Pulse Width: 0.4 ms
Lead Channel Pacing Threshold Pulse Width: 0.4 ms
Lead Channel Pacing Threshold Pulse Width: 0.4 ms
Lead Channel Pacing Threshold Pulse Width: 0.4 ms
Lead Channel Pacing Threshold Pulse Width: 0.4 ms
Lead Channel Pacing Threshold Pulse Width: 0.4 ms
Lead Channel Sensing Intrinsic Amplitude: 12 mV
Lead Channel Sensing Intrinsic Amplitude: 3.2 mV
Lead Channel Setting Pacing Amplitude: 0.25 V
Lead Channel Setting Pacing Amplitude: 1.5 V
Lead Channel Setting Pacing Amplitude: 2 V
Lead Channel Setting Pacing Pulse Width: 0.05 ms
Lead Channel Setting Pacing Pulse Width: 0.4 ms
Lead Channel Setting Sensing Sensitivity: 2 mV
Pulse Gen Model: 3222
Pulse Gen Serial Number: 9188373

## 2022-09-10 NOTE — Progress Notes (Signed)
HPI [name] is referred to consider AV node ablation. The patient is a pleasant 77 yo man with a h/o chronic systolic heart failure, LBBB, he has been placed on amiodarone. He has had recurrent atrial fib with a RVR. He is referred to consider AV node ablation. His pace QRS is 100 ms.  Allergies  Allergen Reactions   Calcium Channel Blockers Other (See Comments)    Came to hospital in 1995-caused chest pain      Current Outpatient Medications  Medication Sig Dispense Refill   acetaminophen (TYLENOL) 500 MG tablet Take 1,000 mg by mouth every 6 (six) hours as needed for moderate pain.     albuterol (PROVENTIL HFA;VENTOLIN HFA) 108 (90 BASE) MCG/ACT inhaler Inhale 2 puffs into the lungs every 6 (six) hours as needed for wheezing. 1 Inhaler 11   amiodarone (PACERONE) 200 MG tablet Take 200 mg by mouth daily.     carvedilol (COREG) 6.25 MG tablet TAKE 1 TABLET BY MOUTH TWICE A DAY 180 tablet 1   empagliflozin (JARDIANCE) 10 MG TABS tablet Take 1 tablet (10 mg total) by mouth daily. 90 tablet 3   feeding supplement (ENSURE ENLIVE / ENSURE PLUS) LIQD Take 237 mLs by mouth 2 (two) times daily between meals. 10000 mL 0   finasteride (PROSCAR) 5 MG tablet TAKE 1/2 TABLET BY MOUTH DAILY 30 tablet 0   fluticasone-salmeterol (WIXELA INHUB) 250-50 MCG/ACT AEPB Inhale 1 puff into the lungs in the morning and at bedtime.     Melatonin 10 MG CAPS Take 10 mg by mouth at bedtime as needed (sleep).     Multiple Vitamins-Minerals (PRESERVISION AREDS 2 PO) Take 1 tablet by mouth in the morning and at bedtime.     pantoprazole (PROTONIX) 40 MG tablet Take 1 tablet (40 mg total) by mouth daily. 30 tablet 0   PHENobarbital (LUMINAL) 64.8 MG tablet Take 1 tablet (64.8 mg total) by mouth 2 (two) times daily. 180 tablet 3   polyethylene glycol (MIRALAX / GLYCOLAX) 17 g packet Take 17 g by mouth 2 (two) times daily. (Patient taking differently: Take 17 g by mouth daily.) 60 each 1   pravastatin (PRAVACHOL)  40 MG tablet TAKE 1 TABLET BY MOUTH EVERY DAY IN THE EVENING 90 tablet 0   sacubitril-valsartan (ENTRESTO) 24-26 MG Take 1 tablet by mouth 2 (two) times daily. Start 08/10/22 60 tablet 8   spironolactone (ALDACTONE) 25 MG tablet Take 0.5 tablets (12.5 mg total) by mouth daily. 90 tablet 3   Tiotropium Bromide Monohydrate (SPIRIVA RESPIMAT) 2.5 MCG/ACT AERS Inhale 2 each into the lungs every evening.     torsemide 40 MG TABS Take 40 mg by mouth daily. Start 08/10/22 90 tablet 3   Vitamin D3 (VITAMIN D) 25 MCG tablet Take 1 tablet (1,000 Units total) by mouth daily. 60 tablet 0   warfarin (COUMADIN) 3 MG tablet Take 1 tablet daily by mouth except 1 and 1/2 tablets on Sundays and Wednesdays or as directed by Anticoagulation Clinic. 100 tablet 0   No current facility-administered medications for this visit.     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 Newell Coral   Nocturia    Obesity    Pansinusitis    a.  complicated by brain abscess and bleeding requiring craniotomy in 1995.   PONV (postoperative nausea and vomiting)    Presence of permanent cardiac pacemaker    Sinusitis    s/p ethmoidectomy and nasal septoplasty   Vertigo    intermitantly    ROS:   All  systems reviewed and negative except as noted in the HPI.   Past Surgical History:  Procedure Laterality Date   BIV UPGRADE N/A 01/29/2020   Procedure: BIV UPGRADE;  Surgeon: Marinus Maw, MD;  Location: [facility] INVASIVE CV LAB;  Service: Cardiovascular;  Laterality: N/A;   CARDIOVERSION N/A 09/26/2021   Procedure: CARDIOVERSION;  Surgeon: Chilton Si, MD;  Location: [facility] ENDOSCOPY;  Service: Cardiovascular;  Laterality: N/A;   CARDIOVERSION N/A 02/03/2022   Procedure: CARDIOVERSION;  Surgeon: Wendall Stade, MD;  Location: [facility] ENDOSCOPY;  Service: Cardiovascular;  Laterality: N/A;   CARDIOVERSION N/A 08/07/2022   Procedure: CARDIOVERSION;  Surgeon: Laurey Morale, MD;  Location: [facility] ENDOSCOPY;  Service: Cardiovascular;  Laterality: N/A;   CIRCUMCISION N/A 11/20/2013   Procedure: CIRCUMCISION ADULT;  Surgeon: Garnett Farm, MD;  Location: [facility] ORS;  Service: Urology;  Laterality: N/A;   CRANIOTOMY  1995   hematomy due to sinus infection    CYSTOSCOPY/URETEROSCOPY/HOLMIUM LASER/STENT PLACEMENT Left 12/23/2021   Procedure: LEFT URETEROSCOPY/HOLMIUM LASER LITHOTRIPSY/STENT PLACEMENT retrograde;  Surgeon: Despina Arias, MD;  Location: [facility] ORS;  Service: Urology;  Laterality: Left;  90 MINUTES NEEDED FOR CASE   PACEMAKER IMPLANT N/A 01/17/2020   Procedure: PACEMAKER IMPLANT;  Surgeon: Duke Salvia, MD;  Location: [facility] INVASIVE CV LAB;  Service: Cardiovascular;  Laterality: N/A;   RIGHT/LEFT HEART CATH AND CORONARY ANGIOGRAPHY N/A 09/20/2019   Procedure: RIGHT/LEFT HEART CATH AND CORONARY ANGIOGRAPHY;  Surgeon: Dolores Patty, MD;  Location: [facility] INVASIVE CV LAB;  Service: Cardiovascular;  Laterality: N/A;   shoulder surg rt   1995   SINUS SURGERY WITH INSTATRAK     ethmoidectomy and nasal septum repair   TEE WITHOUT CARDIOVERSION N/A 09/26/2021   Procedure: TRANSESOPHAGEAL ECHOCARDIOGRAM (TEE);  Surgeon: Chilton Si, MD;  Location: [facility] ENDOSCOPY;  Service: Cardiovascular;  Laterality: N/A;    TEE WITHOUT CARDIOVERSION N/A 02/03/2022   Procedure: TRANSESOPHAGEAL ECHOCARDIOGRAM (TEE);  Surgeon: Wendall Stade, MD;  Location: [facility] ENDOSCOPY;  Service: Cardiovascular;  Laterality: N/A;   TEE WITHOUT CARDIOVERSION N/A 08/07/2022   Procedure: TRANSESOPHAGEAL ECHOCARDIOGRAM (TEE);  Surgeon: Laurey Morale, MD;  Location: [facility] ENDOSCOPY;  Service: Cardiovascular;  Laterality: N/A;     Family History  Problem Relation Age of Onset   Heart failure Mother 83   Heart attack Father 13     Social History   Socioeconomic History   Marital status: Widowed    Spouse name: Not on file   Number of children: Not on file   Years of education: Not  on file   Highest education level: Not on file  Occupational History   Not on file  Tobacco Use   Smoking status: Former    Types: Cigarettes    Quit date: 05/04/1986    Years since quitting: 36.3   Smokeless tobacco: Never   Tobacco comments:    Former smoker 02/12/22  Vaping Use   Vaping Use: Never used  Substance and Sexual Activity   Alcohol use: Not Currently   Drug use: No   Sexual activity: Yes  Other Topics Concern   Not on file  Social History Narrative   Not on file   Social Determinants of Health   Financial Resource Strain: Not on file  Food Insecurity: No Food Insecurity (02/01/2022)   Hunger Vital Sign    Worried About Running Out of Food in the Last Year: Never true    Ran Out of Food in the Last Year: Never true  Transportation Needs: No Transportation Needs (02/01/2022)   PRAPARE - Administrator, Civil Service (Medical): No    Lack of Transportation (Non-Medical): No  Physical Activity: Not on file  Stress: Not on file  Social Connections: Not on file  Intimate Partner Violence: Not At Risk (02/01/2022)   Humiliation, Afraid, Rape, and Kick questionnaire    Fear of Current or Ex-Partner: No    Emotionally Abused: No    Physically Abused: No    Sexually Abused: No     BP 120/62   Pulse 74   Ht  5\' 7"  (1.702 m)   Wt 240 lb (108.9 kg)   SpO2 95%   BMI 37.59 kg/m   Physical Exam:  Well appearing NAD HEENT: Unremarkable Neck:  No JVD, no thyromegally Lymphatics:  No adenopathy Back:  No CVA tenderness Lungs:  Clear with no wheezes HEART:  Regular rate rhythm, no murmurs, no rubs, no clicks Abd:  soft, positive bowel sounds, no organomegally, no rebound, no guarding Ext:  2 plus pulses, no edema, no cyanosis, no clubbing Skin:  No rashes no nodules Neuro:  CN II through XII intact, motor grossly intact  EKG - nsr with ventricular pacing  DEVICE  Normal device function.  See PaceArt for details.   Assess/Plan: Atria lfib with a RVR - his symptoms are paroxysmal and he is on amiodarone which may not be tolerated. I have offered him AV node ablation and the risks/benefits/goals/expectations of the procedure were reviewed and he will call us if he wishes to proceed. Chronic systolic heart failure - his symptoms are class 2, 3 when he is out of rhythm and going fast. Coags - he will hold the coumadin 2 days before his procedure.  Ricky Hayden Ricky Panchal,MD

## 2022-09-10 NOTE — Patient Instructions (Addendum)
Medication Instructions:  Your physician recommends that you continue on your current medications as directed. Please refer to the Current Medication list given to you today.  *If you need a refill on your cardiac medications before your next appointment, please call your pharmacy*  Lab Work: None ordered.  If you have labs (blood work) drawn today and your tests are completely normal, you will receive your results only by: MyChart Message (if you have MyChart) OR A paper copy in the mail If you have any lab test that is abnormal or we need to change your treatment, we will call you to review the results.  Testing/Procedures: None ordered.  Follow-Up: Dr. Lewayne Bunting has ordered an AV NODE Ablation with No Carto, and No Anesthesia.  Please see the dates below;   June:  12, 17, 26, 28 July:  1, 5, 15, 17  Please select a date and call, [phone], and we will schedule the procedure, and a lab draw appointment at that time.    Provider:   Lewayne Bunting, MD{or one of the following Advanced Practice Providers on your designated Care Team:   Francis Dowse, [place] Casimiro Needle "Mardelle Matte" Lanna Poche, [place]   Cardiac Ablation Cardiac ablation is a procedure to destroy, or ablate, a small amount of heart tissue that is causing problems. The heart has many electrical connections. Sometimes, these connections are abnormal and can cause the heart to beat very fast or irregularly. Ablating the abnormal areas can improve the heart's rhythm or return it to normal. Ablation may be done for people who: Have irregular or rapid heartbeats (arrhythmias). Have Wolff-Parkinson-White syndrome. Have taken medicines for an arrhythmia that did not work or caused side effects. Have a high-risk heartbeat that may be life-threatening. Tell a health care provider about: Any allergies you have. All medicines you are taking, including vitamins, herbs, eye drops, creams, and over-the-counter medicines. Any problems you or  family members have had with anesthesia. Any bleeding problems you have. Any surgeries you have had. Any medical conditions you have. Whether you are pregnant or may be pregnant. What are the risks? Your health care provider will talk with you about risks. These may include: Infection. Bruising and bleeding. Stroke or blood clots. Damage to nearby structures or organs. Allergic reaction to medicines or dyes. Needing a pacemaker if the heart gets damaged. A pacemaker is a device that helps the heart beat normally. Failure of the procedure. A repeat procedure may be needed. What happens before the procedure? Medicines Ask your health care provider about: Changing or stopping your regular medicines. These include any heart rhythm medicines, diabetes medicines, or blood thinners you take. Taking medicines such as aspirin and ibuprofen. These medicines can thin your blood. Do not take them unless your health care provider tells you to. Taking over-the-counter medicines, vitamins, herbs, and supplements. General instructions Follow instructions from your health care provider about what you may eat and drink. If you will be going home right after the procedure, plan to have a responsible adult: Take you home from the hospital or clinic. You will not be allowed to drive. Care for you for the time you are told. Ask your health care provider what steps will be taken to prevent infection. What happens during the procedure?  An IV will be inserted into one of your veins. You may be given: A sedative. This helps you relax. Anesthesia. This will: Numb certain areas of your body. An incision will be made in your neck or your groin.  A needle will be inserted through the incision and into a large vein in your neck or groin. The small, thin tube (catheter) will be inserted through the needle and moved to your heart. A type of X-ray (fluoroscopy) will be used to help guide the catheter and provide  images of the heart on a monitor. Dye may be injected through the catheter to help your surgeon see the area of the heart that needs treatment. Electrical currents will be sent from the catheter to destroy heart tissue in certain areas. There are three types of energy that may be used to do this: Heat (radiofrequency energy). Laser energy. Extreme cold (cryoablation). When the tissue has been destroyed, the catheter will be removed. Pressure will be held on the insertion area to prevent bleeding. A bandage (dressing) will be placed over the insertion area. The procedure may vary among health care providers and hospitals. What happens after the procedure? Your blood pressure, heart rate and rhythm, breathing rate, and blood oxygen level will be monitored until you leave the hospital or clinic. Your insertion area will be checked for bleeding. You will need to lie still for a few hours. If your groin was used, you will need to keep your leg straight for a few hours after the catheter is removed. This information is not intended to replace advice given to you by your health care provider. Make sure you discuss any questions you have with your health care provider. Document Revised: 10/07/2021 Document Reviewed: 10/07/2021 Elsevier Patient Education  2023 [employer].

## 2022-09-14 ENCOUNTER — Ambulatory Visit: Payer: No Typology Code available for payment source

## 2022-09-17 ENCOUNTER — Ambulatory Visit: Payer: No Typology Code available for payment source | Attending: Internal Medicine

## 2022-09-17 DIAGNOSIS — Z5181 Encounter for therapeutic drug level monitoring: Secondary | ICD-10-CM

## 2022-09-17 DIAGNOSIS — I4891 Unspecified atrial fibrillation: Secondary | ICD-10-CM

## 2022-09-17 LAB — POCT INR: INR: 2.4 (ref 2.0–3.0)

## 2022-09-17 NOTE — Patient Instructions (Signed)
Description   Continue warfarin taking 1 tablet daily except 1.5 tablets on Sundays and Wednesdays. Recheck INR in 3 weeks.  Anticoagulation Clinic [phone]

## 2022-09-18 ENCOUNTER — Telehealth: Payer: Self-pay | Admitting: Internal Medicine

## 2022-09-18 DIAGNOSIS — I480 Paroxysmal atrial fibrillation: Secondary | ICD-10-CM

## 2022-09-18 NOTE — Telephone Encounter (Signed)
Pt's niece would like a callback regarding scheduling pt's Ablation. Please advise

## 2022-09-18 NOTE — Telephone Encounter (Signed)
Left message for the patient to call the clinic.

## 2022-09-18 NOTE — Telephone Encounter (Signed)
Returned call to patient who states he wants to schedule his procedure on the earliest date possible. Per AVS from 09/10/22 w/ Ladona Ridgel:  Follow-Up: Dr. Lewayne Bunting has ordered an AV NODE Ablation with No Carto, and No Anesthesia.  Please see the dates below;    June:  12, 17, 26, 28 July:  1, 5, 15, 17   Please select a date and call, [phone], and we will schedule the procedure, and a lab draw appointment at that time.     Reiterated above to patient and he confirms that he does wish to proceed with Wednesday, June 12. Advised I would send over to procedure scheduler to see if this date is still available.

## 2022-09-18 NOTE — Telephone Encounter (Signed)
Patient returning call. He requests the call back to:[phone]

## 2022-09-21 NOTE — Addendum Note (Signed)
Addended by: Anselm Pancoast on: 09/21/2022 01:18 PM   Modules accepted: Orders

## 2022-09-23 NOTE — Telephone Encounter (Signed)
Pt is scheduled for AV Node Ablation on 6/12...  He will have labs done on 5/28...  Instruction letter will be sent via MyChart.  Contact Darrow Bussing (Niece) for any questions or concerns [phone]

## 2022-09-23 NOTE — Addendum Note (Signed)
Addended by: Cleda Mccreedy on: 09/23/2022 12:15 PM   Modules accepted: Orders

## 2022-09-23 NOTE — Telephone Encounter (Signed)
LM to call back.

## 2022-09-24 ENCOUNTER — Telehealth: Payer: Self-pay | Admitting: Internal Medicine

## 2022-09-24 DIAGNOSIS — R600 Localized edema: Secondary | ICD-10-CM

## 2022-09-24 MED ORDER — TORSEMIDE 40 MG PO TABS
40.0000 mg | ORAL_TABLET | Freq: Every day | ORAL | Status: DC
Start: 2022-09-24 — End: 2023-03-23

## 2022-09-24 NOTE — Telephone Encounter (Signed)
Pt c/o medication issue:  1. Name of Medication:   torsemide 40 MG TABS    2. How are you currently taking this medication (dosage and times per day)?   Take 40 mg by mouth daily. Start 08/10/22    3. Are you having a reaction (difficulty breathing--STAT)? No  4. What is your medication issue? Pt;s son and niece would like to know if medication needs to be increased due to pt having a 5 lb weight gain within a week. Please advise

## 2022-09-24 NOTE — Telephone Encounter (Signed)
ERROR

## 2022-09-24 NOTE — Telephone Encounter (Signed)
Call received from [facility] Triage.    Orders per Dr. Ladona Ridgel, Pt should, 1. Increase Torsemide to taking 1 tablet in the morning / 1 tablet after lunch, for 3 days;  Then, return back to taking 1 tablet ( 40 mg ) po, daily.    Pt called and educated on Dr. Bruna Potter orders above.  Pt at times seemed confused, but understood orders by end of call, and to eat a banana with breakfast when increasing Torsemide.   Pt daughter Marchelle Folks, called and shared orders above / plan of care.  Marchelle Folks stated she would follow up with her father to make sure he was following Dr. Bruna Potter orders.  Marchelle Folks verbalized understanding.

## 2022-09-29 ENCOUNTER — Ambulatory Visit: Payer: No Typology Code available for payment source

## 2022-10-01 ENCOUNTER — Telehealth (HOSPITAL_COMMUNITY): Payer: Self-pay | Admitting: Vascular Surgery

## 2022-10-01 ENCOUNTER — Other Ambulatory Visit: Payer: Self-pay | Admitting: Internal Medicine

## 2022-10-01 ENCOUNTER — Other Ambulatory Visit (HOSPITAL_COMMUNITY): Payer: Self-pay

## 2022-10-01 ENCOUNTER — Telehealth (HOSPITAL_COMMUNITY): Payer: Self-pay

## 2022-10-01 DIAGNOSIS — I5042 Chronic combined systolic (congestive) and diastolic (congestive) heart failure: Secondary | ICD-10-CM

## 2022-10-01 DIAGNOSIS — I480 Paroxysmal atrial fibrillation: Secondary | ICD-10-CM

## 2022-10-01 MED ORDER — POTASSIUM CHLORIDE CRYS ER 20 MEQ PO TBCR: 20.0000 meq | EXTENDED_RELEASE_TABLET | Freq: Two times a day (BID) | ORAL | 6 refills | Status: DC

## 2022-10-01 NOTE — Progress Notes (Signed)
error 

## 2022-10-01 NOTE — Telephone Encounter (Signed)
Lvm giving 6 week f/u appt w/ db w/ echo, asked ptt o call back to confirm appt 7/11 echo @ 8 f/u w/ db @ 920

## 2022-10-01 NOTE — Telephone Encounter (Signed)
Patient's niece called and states patient is having increased shortness of breath and 5 lb weight gain overnight. He is also very weak. He was seen at [facility] urgent care last week and put on antibiotics for cough. Covid negative and normal chest xray at that visit. They increased his Torsemide for 3 days to 80mg  last Thursday. He is continuing to cough a lot as well. Please advise.

## 2022-10-01 NOTE — Telephone Encounter (Signed)
Spoke to niece, updated on medication changes, potassium sent to pharmacy. Labs ordered and scheduled. Echo ordered.

## 2022-10-06 ENCOUNTER — Other Ambulatory Visit (HOSPITAL_COMMUNITY): Payer: No Typology Code available for payment source

## 2022-10-07 ENCOUNTER — Telehealth (HOSPITAL_COMMUNITY): Payer: Self-pay | Admitting: Vascular Surgery

## 2022-10-07 NOTE — Telephone Encounter (Signed)
Pt niece Carollee Herter lvm on scheduling line tat pt is having problems, she states she lvm on nurse line has not heard anything back

## 2022-10-07 NOTE — Telephone Encounter (Signed)
Returned call -niece questioned missed 6/4 appt Advised appt was for labs Did not wish to reschedule at this time

## 2022-10-08 ENCOUNTER — Telehealth: Payer: Self-pay | Admitting: Internal Medicine

## 2022-10-08 ENCOUNTER — Ambulatory Visit: Payer: No Typology Code available for payment source

## 2022-10-08 NOTE — Telephone Encounter (Signed)
Patient's niece states she is returning another call.

## 2022-10-08 NOTE — Telephone Encounter (Signed)
New Message:      Patient is scheduled for an Ablation on next Wednesday(10-14-22). Niece thinks patient needs to see the doctor before his Ablation. She said for the last 2 weeks he has had an infection.She said he was on antibiotic for 7 days for this. She wants to make sure patient is alright to have his Ablation.He have not had his lab work for his Ablation, because he have been sick,.

## 2022-10-08 NOTE — Telephone Encounter (Signed)
Pt daughter Marchelle Folks called back per message received in [facility] T[facility].   [facility] office number and address shared with Pt family, to see if any provider appointment openings are available?  Pt needs an assessment to determine if he is well enough for AV Node Ablation next week.  See prior RN note.

## 2022-10-08 NOTE — Telephone Encounter (Signed)
Call received in [facility] Triage.   Pt called back cell number and home number, left a message.   Pt daughter called, Ms. Marchelle Folks, and stated her father has been ill the past 2 weeks.  [facility].  Pt also had weight gain of 5 lbs over night, so provider Prince Rome NP, increased Pt torsemide; Per daughter he is still taking the higher dose, and Pt has been complaining of dyspnea on exertion, and with lying flat when sleeping.  Per daughter, Pt completed Abx two weeks ago.   Pt daughter made aware no appointments available at [facility], and Dr. Ladona Ridgel out of office rest to week.  Pt should see a provider today to assess dyspnea concern, and reassess labs s/p increasing torsemide.  The advanced heart failure clinic telephone number given to Pt daughter to call for an appointment.  Pt daughter will f/u with [facility], after finding out if AV Node Ablation is a go for 6/12 next week per provider assessment.  Follow up required.

## 2022-10-09 ENCOUNTER — Telehealth (HOSPITAL_COMMUNITY): Payer: Self-pay | Admitting: Cardiology

## 2022-10-09 ENCOUNTER — Ambulatory Visit: Payer: No Typology Code available for payment source | Attending: Internal Medicine | Admitting: *Deleted

## 2022-10-09 DIAGNOSIS — I4892 Unspecified atrial flutter: Secondary | ICD-10-CM | POA: Diagnosis not present

## 2022-10-09 DIAGNOSIS — Z5181 Encounter for therapeutic drug level monitoring: Secondary | ICD-10-CM | POA: Diagnosis not present

## 2022-10-09 DIAGNOSIS — I4891 Unspecified atrial fibrillation: Secondary | ICD-10-CM

## 2022-10-09 LAB — POCT INR: INR: 2.6 (ref 2.0–3.0)

## 2022-10-09 NOTE — Telephone Encounter (Signed)
Niece Carollee Herter left VM on triage line ~9am with concerns of cough with phlegm  -requests work in appt -reports cough has worsened despite abx from [place] and increase in diuretics    Returned call @ 1011 No answer LMOM

## 2022-10-09 NOTE — Telephone Encounter (Signed)
Returned call to [employer] pt has followup 6/11 to further address issues

## 2022-10-09 NOTE — Patient Instructions (Addendum)
  Description   Follow upcoming instructions for upcoming procedure. Once warfarin resumed, take an extra 1/2 tablet for two day then continue warfarin taking 1 tablet daily except 1.5 tablets on Sundays and Wednesdays. Recheck INR in 1 week-post ablation.   Anticoagulation Clinic [phone]

## 2022-10-09 NOTE — Telephone Encounter (Signed)
Niece called stating patient has been coughing a lot and wants to know if patient's procedure next week should be rescheduled.

## 2022-10-09 NOTE — Telephone Encounter (Signed)
I called the patient's daughter back.  She has called around for appointments w his Dr. Elissa Hefty office and HF clinic.  She has now scheduled him to see the [place] clinic this afternoon.    He is coughing and SOB w minimal exertion.  She thinks his weights are stable and his prn O2 is unchanged w pulse ox ranges from high 80s to mid 90s.    He missed his f/u lab appointment after torsemide was increased 2 weeks ago.  I reviewed with Prince Rome, FNP at [facility] who recommends if he has labs we can possibly further adjust his diuretics but he really should be seen to evaluate due to lung and heart issues.  HF clinic will work to get him an acute visit scheduled.  Discussed w daughter and plan is for the patient to go to [facility] ER for evaluation this morning.  She was grateful for assistance.

## 2022-10-10 LAB — BASIC METABOLIC PANEL
BUN/Creatinine Ratio: 21 (ref 10–24)
BUN: 48 mg/dL — ABNORMAL HIGH (ref 8–27)
CO2: 28 mmol/L (ref 20–29)
Calcium: 9.8 mg/dL (ref 8.6–10.2)
Chloride: 99 mmol/L (ref 96–106)
Creatinine, Ser: 2.26 mg/dL — ABNORMAL HIGH (ref 0.76–1.27)
Glucose: 72 mg/dL (ref 70–99)
Potassium: 5.6 mmol/L — ABNORMAL HIGH (ref 3.5–5.2)
Sodium: 142 mmol/L (ref 134–144)
eGFR: 29 mL/min/{1.73_m2} — ABNORMAL LOW (ref >59)

## 2022-10-10 LAB — CBC
Hematocrit: 45.1 % (ref 37.5–51.0)
Hemoglobin: 15 g/dL (ref 13.0–17.7)
MCH: 31.4 pg (ref 26.6–33.0)
MCHC: 33.3 g/dL (ref 31.5–35.7)
MCV: 94 fL (ref 79–97)
Platelets: 163 10*3/uL (ref 150–450)
RBC: 4.78 x10E6/uL (ref 4.14–5.80)
RDW: 13.1 % (ref 11.6–15.4)
WBC: 6.9 10*3/uL (ref 3.4–10.8)

## 2022-10-12 ENCOUNTER — Telehealth (HOSPITAL_COMMUNITY): Payer: Self-pay

## 2022-10-12 NOTE — Progress Notes (Signed)
ADVANCED HF CLINIC NOTE   PCP: [facility] EP: Dr. Rosezella [place] HF Cardiologist: Dr. 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    EF 45-50% in 2016, 30-35% in 2017 and 2020.   CM previously felt to be d/t combination of LBBB and recurrent AF.   Admitted 4/10 to 08/09/19 with COVID PNA and HF. Echo repeated and EF back down to 20% w/ severe RV dysfunction. He recovered from COVID but unfortunately, his wife died from COVID.    Underwent CRT-P in September 2021.   He was admitted to [facility] in 12/22 with a/c CHF and atrial flutter. Underwent successful DCCV. Readmitted in 04/23 with acute respiratory failure, urosepsis and proteus bacteremia. Echo during admit with EF 40-45%. Course further c/b brief PEA arrest, aspiration PNA/HCAP, and atrial flutter with RVR. Underwent successful DCCV. EF 30-35% on TEE.    Admitted again in June 2023 with pyelonephritis and Jardiance stopped. Had afib with RVR but converted to SR spontaneously.    Admitted 9/23 with acute hypoxic respiratory failure secondary to a/c CHF and AF with RVR. Started on IV amiodarone and IV lasix.  Underwent TEE guided DCCV to SR. TEE demonstrated EF 20-25%, RV severely reduced, moderate TR.  Transient hypotension post TEE requiring pressors. Drips weaned and GDMT restarted.  Discharged home, weight 223 lbs.  Seen for hospital f/u 02/12/22. Low-dose Entresto restarted.   Seen in ED 02/24/22 with atrial flutter with RVR. EP was consulted. Successfully converted to SR with overdriving pacing. Amiodarone increased to 200 mg BID.  Note felt to be good candidate for PVI ablation. Planning to see Dr. Graciela Husbands next month to see if potential candidate for high risk PVI ablation.  Returned to ED 03/06/22 with recurrent  atrial flutter. Cardioversion planned but converted to SR while in ED.  Follow up 11/23, volume stable with NYHA II-III and volume stable. Confusion over medications, but he declined paramedicine.   Echo 05/12/22 EF 30-35%   Follow up with EP 3/24, holding off on ablation of PVI or AV node. Amio decreased to 300 mg daily. Volume overloaded, K 5.3 so spiro stopped and torsemide increased to 40 x 3 days.  Follow up 4/24, volume overloaded, off Jardiance. SGLT2i restarted, torsemide increased to 40 daily.  Admitted 4/24 with CP and palpitations, ECG showed WCT HR 130's, also volume overloaded. Diuresed and started on amio gtt. INR subtherapeutic, underwent TEE/DCCV to NSR. GDMT limited by elevated SCr, Entresto held. Drips weaned, continued on po amiodarone and discharged home, weight 241.5 lbs.  Post hospital follow up 4/24, stable NYHA II-early III and volume stable. Plan to repeat echo now that he is back in SR to look for EF improvement.  Saw PCP at [facility] 10/09/22 with complaints of cough, congestion and SOB. CXR w/o evidence of PNA or pulmonary edema. Felt to have COPD exacerbation and given nebs, prednisone and doxycycline with improvement in symptoms.  Today he returns for an acute visit with complaints of increased SOB, starting last week. He called our office and was advised to come in before his scheduled ablation. He saw PCP at [facility] last week and treated for COPD exacerbation, overall feeling much better. He is not SOB walking short distances on flat ground, he has SOB walking around [facility] and has to take  breaks. Denies  palpitations, abnormal bleeding, CP, dizziness, edema, or PND/Orthopnea. He has an adjustable bed. He wears 2L oxygen at night. Appetite ok. No fever or chills. Weight at home 240 pounds. Taking all medications. Planning ablation tomorrow with Dr. Ladona Ridgel.   Cardiac Studies  - TEE/DCCV (4/24): EF < 20%, septal-lateral dyssynchrony consistent with LBBB, RV moderately down  -  Echo (1/24): EF 30-35%  - TEE (10/23): EF 20-25%, RV severely reduced, moderate TR, no LAA  - Echo (4/23): EF 40-45%, LV mildly decreased, grade I DD, RV ok  - Echo (4/21): EF 20%, severe RV dysfunction (had COVID PNA)  - Echo (2020): EF 30-35%  - Echo (2017): EF 30-35%  - Echo (2016): EF 45-50%  Past Medical History:  Diagnosis Date   Arthritis    osteoarthritis of left knee   Ascending aorta dilatation (HCC)    Balanitis    recurrent   Cardiac arrhythmia    life threatening, secondary to CCB vs b- blockers   Cardiomyopathy (HCC)    Chronic joint pain    Chronic systolic CHF (congestive heart failure) (HCC)    CKD (chronic kidney disease), stage III (HCC)    COPD (chronic obstructive pulmonary disease) (HCC)    Coronary artery disease    Dilated aortic root (HCC)    Dyspnea    Enlarged prostate    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 Newell Coral   Nocturia    Obesity    Pansinusitis    a.  complicated by brain abscess and bleeding requiring craniotomy in 1995.   PONV (postoperative nausea and vomiting)    Presence of permanent cardiac pacemaker    Sinusitis    s/p ethmoidectomy and nasal septoplasty   Vertigo    intermitantly   Current Outpatient Medications  Medication Sig Dispense Refill   albuterol (PROVENTIL HFA;VENTOLIN HFA) 108 (90 BASE) MCG/ACT inhaler Inhale 2 puffs into the lungs every 6 (six) hours as needed for wheezing. 1 Inhaler 11   amiodarone (PACERONE) 200 MG tablet Take 200 mg by mouth daily.     carvedilol (COREG) 6.25 MG tablet TAKE 1 TABLET BY MOUTH TWICE A DAY 180 tablet 1   doxycycline (VIBRA-TABS) 100 MG tablet Take by mouth.     empagliflozin (JARDIANCE) 10 MG TABS tablet Take 1 tablet (10 mg total) by mouth daily. 90  tablet 3   feeding supplement (ENSURE ENLIVE / ENSURE PLUS) LIQD Take 237 mLs by mouth 2 (two) times daily between meals. 10000 mL 0   finasteride (PROSCAR) 5 MG tablet TAKE 1/2 TABLET BY MOUTH DAILY 30 tablet 0   fluticasone-salmeterol (WIXELA INHUB) 250-50 MCG/ACT AEPB Inhale 1 puff into the lungs in the morning and at bedtime.     Multiple Vitamins-Minerals (PRESERVISION AREDS 2 PO) Take 1 tablet by mouth in the morning and at bedtime.     pantoprazole (PROTONIX) 40 MG tablet Take 1 tablet (40 mg total) by mouth daily. 30 tablet 0   PHENobarbital (LUMINAL) 64.8 MG tablet Take 1 tablet (64.8 mg total) by mouth 2 (two) times daily. 180 tablet 3   polyethylene glycol (MIRALAX / GLYCOLAX) 17 g packet Take 17 g by mouth 2 (two) times daily. (Patient taking differently: Take 17 g  by mouth daily.) 60 each 1   potassium chloride SA (KLOR-CON M) 20 MEQ tablet Take 1 tablet (20 mEq total) by mouth 2 (two) times daily. 30 tablet 6   pravastatin (PRAVACHOL) 40 MG tablet TAKE 1 TABLET BY MOUTH EVERY DAY IN THE EVENING 90 tablet 0   predniSONE (DELTASONE) 20 MG tablet Take by mouth.     sacubitril-valsartan (ENTRESTO) 24-26 MG Take 1 tablet by mouth 2 (two) times daily. Start 08/10/22 60 tablet 8   spironolactone (ALDACTONE) 25 MG tablet Take 0.5 tablets (12.5 mg total) by mouth daily. 90 tablet 3   Tiotropium Bromide Monohydrate (SPIRIVA RESPIMAT) 2.5 MCG/ACT AERS Inhale 2 each into the lungs every evening.     Torsemide 40 MG TABS Take 40 mg by mouth daily. Take 1 tablet ( 40 mg ) in morning, and 1 tablet ( 40 mg ) after lunch, for three days only;  Then go back to taking: 1 tablet (40 mg) by mouth, daily.  Order by Dr. Lewayne Bunting 09/24/2022 (Patient taking differently: Take 40 mg by mouth daily. Take 1 tablet ( 40 mg ) in morning, and 1 tablet ( 40 mg ) after lunch, for three days only;  Then go back to taking: 1 tablet (40 mg) by mouth, daily.  Order by Dr. Lewayne Bunting 09/24/2022  6/11 - pt is taking 80 MG  in am and 40 MG pm)     Vitamin D3 (VITAMIN D) 25 MCG tablet Take 1 tablet (1,000 Units total) by mouth daily. 60 tablet 0   warfarin (COUMADIN) 3 MG tablet TAKE 1 TABLET DAILY BY MOUTH EXCEPT 1 AND 1/2 TABLETS ON SUNDAYS AND WEDNESDAYS OR AS DIRECTED BY ANTICOAGULATION CLINIC. 100 tablet 3   acetaminophen (TYLENOL) 500 MG tablet Take 1,000 mg by mouth every 6 (six) hours as needed for moderate pain. (Patient not taking: Reported on 10/13/2022)     Melatonin 10 MG CAPS Take 10 mg by mouth at bedtime as needed (sleep). (Patient not taking: Reported on 10/13/2022)     No current facility-administered medications for this encounter.   Allergies  Allergen Reactions   Calcium Channel Blockers Other (See Comments)    Came to hospital in 1995-caused chest pain    Social History   Socioeconomic History   Marital status: Widowed    Spouse name: Not on file   Number of children: Not on file   Years of education: Not on file   Highest education level: Not on file  Occupational History   Not on file  Tobacco Use   Smoking status: Former    Types: Cigarettes    Quit date: 05/04/1986    Years since quitting: 36.4   Smokeless tobacco: Never   Tobacco comments:    Former smoker 02/12/22  Vaping Use   Vaping Use: Never used  Substance and Sexual Activity   Alcohol use: Not Currently   Drug use: No   Sexual activity: Yes  Other Topics Concern   Not on file  Social History Narrative   Not on file   Social Determinants of Health   Financial Resource Strain: Not on file  Food Insecurity: No Food Insecurity (02/01/2022)   Hunger Vital Sign    Worried About Running Out of Food in the Last Year: Never true    Ran Out of Food in the Last Year: Never true  Transportation Needs: No Transportation Needs (02/01/2022)   PRAPARE - Administrator, Civil Service (Medical): No  Lack of Transportation (Non-Medical): No  Physical Activity: Not on file  Stress: Not on file  Social  Connections: Not on file  Intimate Partner Violence: Not At Risk (02/01/2022)   Humiliation, Afraid, Rape, and Kick questionnaire    Fear of Current or Ex-Partner: No    Emotionally Abused: No    Physically Abused: No    Sexually Abused: No   Family History  Problem Relation Age of Onset   Heart failure Mother 60   Heart attack Father 58   BP 98/64   Pulse 72   Ht 5\' 7"  (1.702 m)   Wt 111.6 kg (246 lb)   SpO2 96%   BMI 38.53 kg/m   Wt Readings from Last 3 Encounters:  10/13/22 111.6 kg (246 lb)  09/10/22 108.9 kg (240 lb)  08/25/22 111.2 kg (245 lb 3.2 oz)   PHYSICAL EXAM: General:  NAD. No resp difficulty, walked into clinic with RW HEENT: Normal Neck: Supple. No JVD, thick neck. Carotids 2+ bilat; no bruits. No lymphadenopathy or thryomegaly appreciated. Cor: PMI nondisplaced. Regular rate & rhythm. No rubs, gallops or murmurs. Lungs: Faint rhonchi RLL Abdomen: Soft, obese, nontender, nondistended. No hepatosplenomegaly. No bruits or masses. Good bowel sounds. Extremities: No cyanosis, clubbing, rash, trace LLE edema Neuro: Alert & oriented x 3, cranial nerves grossly intact. Moves all 4 extremities w/o difficulty. Affect pleasant.  ReDs: 33%  St Jude interrogation (personally reviewed): CorVue stable, no recent AF, BiV pacing 97%  ASSESSMENT & PLAN: 1. Atrial fibrillation/flutter  - Multiple previous admits with Afib and flutter - S/p TEE/DCCV to SR in 10/23. Had recurrent AFL and had overdrive pacing in ED 02/24/22  (saw Dr. Graciela Husbands 3/24) - not felt to be candidate for PVI ablation or AV node ablation - CHA2DS2-VASc score is 6. - On coumadin. Not on DOAC d/t interaction with phenobarbital use (see below). - Had been maintaining NSR with amiodarone but was admitted with AF/RVR and CHF exacerbation.   - Now s/p TEE/ DCCV (4/24). Now back in NSR.  - Continue amiodarone 200 mg daily. TSH (12/23) ok, LFTs (2/24) ok - Continue warfarin. INR followed by Coumadin Clinic.  Check INR today as he is on prednisone - Not interested in sleep study, wears 2L oxygen at night. - Planning AF ablation tomorrow with Dr. Ladona Ridgel. Stable for procedure from HF standpoint.  2. Chronic systolic CHF  - cardiomyopathy dates back to at least 2016.  - EF variable over the years. CM previously thought to be d/t LBBB +/- recurrent AF.  - S/p CRT-P (9/21). - Multiple admissions for a/c CHF and recurrent AF this past year - Echo (2021): EF < 20% . LHC with no significant CAD. - Echo (4/23): EF 40-45% - Echo (09/26/21): EF 30-35% in setting of AF - TEE (10/23): EF 20-25%, RV severely reduced, severe BAE, moderate TR - Echo (1/24): EF 30-35% - TEE (4/24): EF < 20% (in setting of AF RVR). - stable NYHA early III, function class confounded by body habitus. Volume OK today, ReDs 33% and CorVue stable. - Continue torsemide 80/40 mg daily. - Continue Jardiance 10 mg daily. No GU symptoms. - Continue Coreg 6.25 mg bid.   - Continue spironolactone 12.5 mg daily - Continue Entresto 24/26 mg bid. - Labs from 10/09/22 reviewed, K 5.6, SCr 2.26. Repeat BMET today. Give sample of Lokelma for home use PRN. - Stop KCL suppl. - Repeat echo next visit now that he is back in SR.  3. CKD stage  3 - Baseline SCr 2.3 - On SGLT2i - BMET .  4. H/o brain abscess - S/p craniotomy 2015 - Has been on phenobarbital since 1995, no seizures.  5. COPD - Recent exacerbation - On prednisone and doxy - Oxygen 96% on room air.  Keep follow up with Dr. Gala Romney + echo, as scheduled.  Jacklynn Ganong, FNP  8:48 AM

## 2022-10-12 NOTE — Telephone Encounter (Signed)
Called to confirm/remind patient of their appointment at the [facility] on 10/13/22.   Patient reminded to bring all medications and/or complete list.  Confirmed patient has transportation. Gave directions, instructed to utilize valet parking.  Confirmed appointment prior to ending call.

## 2022-10-13 ENCOUNTER — Ambulatory Visit (HOSPITAL_COMMUNITY)
Admission: RE | Admit: 2022-10-13 | Discharge: 2022-10-13 | Disposition: A | Discharge status: Home / Self Care | Payer: No Typology Code available for payment source | Source: Ambulatory Visit | Attending: Family Medicine | Admitting: Family Medicine

## 2022-10-13 ENCOUNTER — Encounter (HOSPITAL_COMMUNITY): Payer: Self-pay

## 2022-10-13 VITALS — BP 98/64 | HR 72 | Ht 67.0 in | Wt 246.0 lb

## 2022-10-13 DIAGNOSIS — J440 Chronic obstructive pulmonary disease with acute lower respiratory infection: Secondary | ICD-10-CM | POA: Diagnosis not present

## 2022-10-13 DIAGNOSIS — G060 Intracranial abscess and granuloma: Secondary | ICD-10-CM | POA: Diagnosis not present

## 2022-10-13 DIAGNOSIS — I5022 Chronic systolic (congestive) heart failure: Secondary | ICD-10-CM | POA: Diagnosis not present

## 2022-10-13 DIAGNOSIS — E1122 Type 2 diabetes mellitus with diabetic chronic kidney disease: Secondary | ICD-10-CM | POA: Diagnosis not present

## 2022-10-13 DIAGNOSIS — I480 Paroxysmal atrial fibrillation: Secondary | ICD-10-CM

## 2022-10-13 DIAGNOSIS — Z8661 Personal history of infections of the central nervous system: Secondary | ICD-10-CM | POA: Insufficient documentation

## 2022-10-13 DIAGNOSIS — I130 Hypertensive heart and chronic kidney disease with heart failure and stage 1 through stage 4 chronic kidney disease, or unspecified chronic kidney disease: Secondary | ICD-10-CM | POA: Insufficient documentation

## 2022-10-13 DIAGNOSIS — N183 Chronic kidney disease, stage 3 unspecified: Secondary | ICD-10-CM | POA: Insufficient documentation

## 2022-10-13 DIAGNOSIS — Z79899 Other long term (current) drug therapy: Secondary | ICD-10-CM | POA: Insufficient documentation

## 2022-10-13 DIAGNOSIS — Z7901 Long term (current) use of anticoagulants: Secondary | ICD-10-CM | POA: Diagnosis not present

## 2022-10-13 DIAGNOSIS — I4892 Unspecified atrial flutter: Secondary | ICD-10-CM | POA: Insufficient documentation

## 2022-10-13 DIAGNOSIS — Z7984 Long term (current) use of oral hypoglycemic drugs: Secondary | ICD-10-CM | POA: Insufficient documentation

## 2022-10-13 DIAGNOSIS — I447 Left bundle-branch block, unspecified: Secondary | ICD-10-CM | POA: Diagnosis not present

## 2022-10-13 DIAGNOSIS — E785 Hyperlipidemia, unspecified: Secondary | ICD-10-CM | POA: Diagnosis not present

## 2022-10-13 DIAGNOSIS — I5082 Biventricular heart failure: Secondary | ICD-10-CM | POA: Insufficient documentation

## 2022-10-13 DIAGNOSIS — J449 Chronic obstructive pulmonary disease, unspecified: Secondary | ICD-10-CM

## 2022-10-13 DIAGNOSIS — R0602 Shortness of breath: Secondary | ICD-10-CM | POA: Diagnosis present

## 2022-10-13 LAB — BASIC METABOLIC PANEL
Anion gap: 12 (ref 5–15)
BUN: 73 mg/dL — ABNORMAL HIGH (ref 8–23)
CO2: 27 mmol/L (ref 22–32)
Calcium: 9.3 mg/dL (ref 8.9–10.3)
Chloride: 99 mmol/L (ref 98–111)
Creatinine, Ser: 2.47 mg/dL — ABNORMAL HIGH (ref 0.61–1.24)
GFR, Estimated: 26 mL/min — ABNORMAL LOW (ref >60)
Glucose, Bld: 134 mg/dL — ABNORMAL HIGH (ref 70–99)
Potassium: 5.1 mmol/L (ref 3.5–5.1)
Sodium: 138 mmol/L (ref 135–145)

## 2022-10-13 LAB — PROTIME-INR
INR: 2.7 — ABNORMAL HIGH (ref 0.8–1.2)
Prothrombin Time: 28.7 seconds — ABNORMAL HIGH (ref 11.4–15.2)

## 2022-10-13 NOTE — Progress Notes (Signed)
Lokelma samples given to pt, he is aware to only use them if we call and advise him to do so.  Medication Samples have been provided to the patient.  Drug name: Lokelma       Strength: 10g        Qty: 2 pks  LOT: [id number]  Exp.Date: 10/01/2024  Dosing instructions: Use as Directed by AHF Clinic  The patient has been instructed regarding the correct time, dose, and frequency of taking this medication, including desired effects and most common side effects.   Ricky Hayden 9:04 AM 10/13/2022

## 2022-10-13 NOTE — Pre-Procedure Instructions (Addendum)
Attempted to call patient regarding procedure instructions.  Left voicemail on  the following items: Arrival time 0830 Nothing to eat or drink after midnight No meds AM of procedure Responsible person to drive you home and stay with you for 24 hrs  Have you missed any doses of anti-coagulant Coumadin-last dose Sunday 6/9.  Last dose of Jardiance 6/7.

## 2022-10-13 NOTE — Patient Instructions (Signed)
Medication Changes:  STOP Potassium **We have given you samples of Lokelma, only use this if we call and advise you to do so  Lab Work:  Labs done today, your results will be available in MyChart, we will contact you for abnormal readings.  Testing/Procedures:  Your physician has requested that you have an echocardiogram. Echocardiography is a painless test that uses sound waves to create images of your heart. It provides your doctor with information about the size and shape of your heart and how well your heart's chambers and valves are working. This procedure takes approximately one hour. There are no restrictions for this procedure. Please do NOT wear cologne, perfume, aftershave, or lotions (deodorant is allowed). Please arrive 15 minutes prior to your appointment time. JULY 11TH AT 8 AM  Referrals:  none  Special Instructions // Education:  Do the following things EVERYDAY: Weigh yourself in the morning before breakfast. Write it down and keep it in a log. Take your medicines as prescribed Eat low salt foods--Limit salt (sodium) to 2000 mg per day.  Stay as active as you can everyday Limit all fluids for the day to less than 2 liters   Follow-Up in: 1 month with Dr Gala Romney and echocardiogram    At the [facility], you and your health needs are our priority. We have a designated team specialized in the treatment of Heart Failure. This Care Team includes your primary Heart Failure Specialized Cardiologist (physician), Advanced Practice Providers (APPs- Physician Assistants and Nurse Practitioners), and Pharmacist who all work together to provide you with the care you need, when you need it.   You may see any of the following providers on your designated Care Team at your next follow up:  Dr. Arvilla Meres Dr. Marca Ancona Dr. Marcos Eke, NP Robbie Lis, [name] [facility] [place], [name] Brynda Peon, NP Karle Plumber,  PharmD   Please be sure to bring in all your medications bottles to every appointment.   Need to Contact us:  If you have any questions or concerns before your next appointment please send [place] a message through [place] or call our office at [phone].    TO LEAVE A MESSAGE FOR THE NURSE SELECT OPTION 2, PLEASE LEAVE A MESSAGE INCLUDING: YOUR NAME DATE OF BIRTH CALL BACK NUMBER REASON FOR CALL**this is important as we prioritize the call backs  YOU WILL RECEIVE A CALL BACK THE SAME DAY AS LONG AS YOU CALL BEFORE 4:00 PM

## 2022-10-13 NOTE — Addendum Note (Signed)
Encounter addended by: Chrystine Oiler, CMA on: 10/13/2022 11:59 AM  Actions taken: Specialty comments modified, Flowsheet accepted

## 2022-10-14 ENCOUNTER — Encounter (HOSPITAL_COMMUNITY)
Admission: RE | Disposition: A | Discharge status: Home / Self Care | Payer: Self-pay | Source: Home / Self Care | Attending: Internal Medicine

## 2022-10-14 ENCOUNTER — Other Ambulatory Visit: Payer: Self-pay

## 2022-10-14 ENCOUNTER — Ambulatory Visit (HOSPITAL_COMMUNITY)
Admission: RE | Admit: 2022-10-14 | Discharge: 2022-10-14 | Disposition: A | Discharge status: Home / Self Care | Payer: No Typology Code available for payment source | Attending: Internal Medicine | Admitting: Internal Medicine

## 2022-10-14 DIAGNOSIS — N183 Chronic kidney disease, stage 3 unspecified: Secondary | ICD-10-CM | POA: Diagnosis not present

## 2022-10-14 DIAGNOSIS — Z950 Presence of cardiac pacemaker: Secondary | ICD-10-CM | POA: Insufficient documentation

## 2022-10-14 DIAGNOSIS — I130 Hypertensive heart and chronic kidney disease with heart failure and stage 1 through stage 4 chronic kidney disease, or unspecified chronic kidney disease: Secondary | ICD-10-CM | POA: Diagnosis not present

## 2022-10-14 DIAGNOSIS — I480 Paroxysmal atrial fibrillation: Secondary | ICD-10-CM | POA: Diagnosis not present

## 2022-10-14 DIAGNOSIS — I447 Left bundle-branch block, unspecified: Secondary | ICD-10-CM | POA: Diagnosis not present

## 2022-10-14 DIAGNOSIS — I4819 Other persistent atrial fibrillation: Secondary | ICD-10-CM

## 2022-10-14 DIAGNOSIS — Z7901 Long term (current) use of anticoagulants: Secondary | ICD-10-CM | POA: Insufficient documentation

## 2022-10-14 DIAGNOSIS — Z87891 Personal history of nicotine dependence: Secondary | ICD-10-CM | POA: Diagnosis not present

## 2022-10-14 DIAGNOSIS — I5022 Chronic systolic (congestive) heart failure: Secondary | ICD-10-CM | POA: Diagnosis not present

## 2022-10-14 HISTORY — PX: AV NODE ABLATION: EP1193

## 2022-10-14 LAB — PROTIME-INR
INR: 2.2 — ABNORMAL HIGH (ref 0.8–1.2)
Prothrombin Time: 24.9 seconds — ABNORMAL HIGH (ref 11.4–15.2)

## 2022-10-14 SURGERY — AV NODE ABLATION

## 2022-10-14 MED ORDER — MIDAZOLAM HCL 5 MG/5ML IJ SOLN
INTRAMUSCULAR | Status: AC
  Filled 2022-10-14: qty 5

## 2022-10-14 MED ORDER — ACETAMINOPHEN 325 MG PO TABS: 650.0000 mg | ORAL_TABLET | ORAL | Status: DC | PRN

## 2022-10-14 MED ORDER — SODIUM CHLORIDE 0.9 % IV SOLN: INTRAVENOUS | Status: DC

## 2022-10-14 MED ORDER — BUPIVACAINE HCL (PF) 0.25 % IJ SOLN
INTRAMUSCULAR | Status: DC | PRN
  Administered 2022-10-14: 30 mL

## 2022-10-14 MED ORDER — FENTANYL CITRATE (PF) 100 MCG/2ML IJ SOLN
INTRAMUSCULAR | Status: AC
  Filled 2022-10-14: qty 2

## 2022-10-14 MED ORDER — MIDAZOLAM HCL 5 MG/5ML IJ SOLN
INTRAMUSCULAR | Status: DC | PRN
  Administered 2022-10-14: 2 mg via INTRAVENOUS
  Administered 2022-10-14: 1 mg via INTRAVENOUS

## 2022-10-14 MED ORDER — HEPARIN (PORCINE) IN NACL 1000-0.9 UT/500ML-% IV SOLN
INTRAVENOUS | Status: DC | PRN
  Administered 2022-10-14: 500 mL

## 2022-10-14 MED ORDER — SODIUM CHLORIDE 0.9 % IV SOLN: 250.0000 mL | INTRAVENOUS | Status: DC | PRN

## 2022-10-14 MED ORDER — LIDOCAINE HCL (PF) 1 % IJ SOLN
INTRAMUSCULAR | Status: AC
  Filled 2022-10-14: qty 30

## 2022-10-14 MED ORDER — FENTANYL CITRATE (PF) 100 MCG/2ML IJ SOLN
INTRAMUSCULAR | Status: DC | PRN
  Administered 2022-10-14: 12.5 ug via INTRAVENOUS
  Administered 2022-10-14: 25 ug via INTRAVENOUS

## 2022-10-14 MED ORDER — BUPIVACAINE HCL (PF) 0.25 % IJ SOLN
INTRAMUSCULAR | Status: AC
  Filled 2022-10-14: qty 30

## 2022-10-14 MED ORDER — LIDOCAINE HCL (PF) 1 % IJ SOLN
INTRAMUSCULAR | Status: DC | PRN
  Administered 2022-10-14: 20 mL

## 2022-10-14 MED ORDER — SODIUM CHLORIDE 0.9% FLUSH: 3.0000 mL | INTRAVENOUS | Status: DC | PRN

## 2022-10-14 MED ORDER — ONDANSETRON HCL 4 MG/2ML IJ SOLN: 4.0000 mg | Freq: Four times a day (QID) | INTRAMUSCULAR | Status: DC | PRN

## 2022-10-14 SURGICAL SUPPLY — 6 items
CATH BLAZER 7FR 4MM LG 5031TK2 (ABLATOR) IMPLANT
PACK EP LATEX FREE (CUSTOM PROCEDURE TRAY) ×1
PACK EP LF (CUSTOM PROCEDURE TRAY) ×1 IMPLANT
PAD DEFIB RADIO PHYSIO CONN (PAD) ×1 IMPLANT
SHEATH PINNACLE 8F 10CM (SHEATH) IMPLANT
SHEATH PROBE COVER 6X72 (BAG) IMPLANT

## 2022-10-14 NOTE — Progress Notes (Signed)
61F venous sheath removed from right groin.  Manual pressure held for 10 minutes.  Right groin site level 0 pre and post pull.  Right DP pulse palpable post sheath pull.  Bedrest started at 1232.  Gauze and tegaderm applied to right groin.  Instructions reviewed with patient.

## 2022-10-14 NOTE — H&P (Signed)
    HPI Ricky Hayden is referred to consider AV node ablation. The patient is a pleasant 76 yo man with a h/o chronic systolic heart failure, LBBB, he has been placed on amiodarone. He has had recurrent atrial fib with a RVR. He is referred to consider AV node ablation. His pace QRS is 100 ms.  Allergies  Allergen Reactions   Calcium Channel Blockers Other (See Comments)    Came to hospital in 1995-caused chest pain      Current Outpatient Medications  Medication Sig Dispense Refill   acetaminophen (TYLENOL) 500 MG tablet Take 1,000 mg by mouth every 6 (six) hours as needed for moderate pain.     albuterol (PROVENTIL HFA;VENTOLIN HFA) 108 (90 BASE) MCG/ACT inhaler Inhale 2 puffs into the lungs every 6 (six) hours as needed for wheezing. 1 Inhaler 11   amiodarone (PACERONE) 200 MG tablet Take 200 mg by mouth daily.     carvedilol (COREG) 6.25 MG tablet TAKE 1 TABLET BY MOUTH TWICE A DAY 180 tablet 1   empagliflozin (JARDIANCE) 10 MG TABS tablet Take 1 tablet (10 mg total) by mouth daily. 90 tablet 3   feeding supplement (ENSURE ENLIVE / ENSURE PLUS) LIQD Take 237 mLs by mouth 2 (two) times daily between meals. 10000 mL 0   finasteride (PROSCAR) 5 MG tablet TAKE 1/2 TABLET BY MOUTH DAILY 30 tablet 0   fluticasone-salmeterol (WIXELA INHUB) 250-50 MCG/ACT AEPB Inhale 1 puff into the lungs in the morning and at bedtime.     Melatonin 10 MG CAPS Take 10 mg by mouth at bedtime as needed (sleep).     Multiple Vitamins-Minerals (PRESERVISION AREDS 2 PO) Take 1 tablet by mouth in the morning and at bedtime.     pantoprazole (PROTONIX) 40 MG tablet Take 1 tablet (40 mg total) by mouth daily. 30 tablet 0   PHENobarbital (LUMINAL) 64.8 MG tablet Take 1 tablet (64.8 mg total) by mouth 2 (two) times daily. 180 tablet 3   polyethylene glycol (MIRALAX / GLYCOLAX) 17 g packet Take 17 g by mouth 2 (two) times daily. (Patient taking differently: Take 17 g by mouth daily.) 60 each 1   pravastatin (PRAVACHOL)  40 MG tablet TAKE 1 TABLET BY MOUTH EVERY DAY IN THE EVENING 90 tablet 0   sacubitril-valsartan (ENTRESTO) 24-26 MG Take 1 tablet by mouth 2 (two) times daily. Start 08/10/22 60 tablet 8   spironolactone (ALDACTONE) 25 MG tablet Take 0.5 tablets (12.5 mg total) by mouth daily. 90 tablet 3   Tiotropium Bromide Monohydrate (SPIRIVA RESPIMAT) 2.5 MCG/ACT AERS Inhale 2 each into the lungs every evening.     torsemide 40 MG TABS Take 40 mg by mouth daily. Start 08/10/22 90 tablet 3   Vitamin D3 (VITAMIN D) 25 MCG tablet Take 1 tablet (1,000 Units total) by mouth daily. 60 tablet 0   warfarin (COUMADIN) 3 MG tablet Take 1 tablet daily by mouth except 1 and 1/2 tablets on Sundays and Wednesdays or as directed by Anticoagulation Clinic. 100 tablet 0   No current facility-administered medications for this visit.     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 Nudelman   Nocturia    Obesity    Pansinusitis    a.  complicated by brain abscess and bleeding requiring craniotomy in 1995.   PONV (postoperative nausea and vomiting)    Presence of permanent cardiac pacemaker    Sinusitis    s/p ethmoidectomy and nasal septoplasty   Vertigo    intermitantly    ROS:   All  systems reviewed and negative except as noted in the HPI.   Past Surgical History:  Procedure Laterality Date   BIV UPGRADE N/A 01/29/2020   Procedure: BIV UPGRADE;  Surgeon: Katja Blue W, MD;  [facility]: [facility];  Service: Cardiovascular;  Laterality: N/A;   CARDIOVERSION N/A 09/26/2021   Procedure: CARDIOVERSION;  Surgeon: [place], Tiffany, MD;  Location: [facility];  Service: Cardiovascular;  Laterality: N/A;   CARDIOVERSION N/A 02/03/2022   Procedure: CARDIOVERSION;  Surgeon: Nishan, Peter C, MD;  Location: [facility];  Service: Cardiovascular;  Laterality: N/A;   CARDIOVERSION N/A 08/07/2022   Procedure: CARDIOVERSION;  Surgeon: McLean, Dalton S, MD;  Location: [facility];  Service: Cardiovascular;  Laterality: N/A;   CIRCUMCISION N/A 11/20/2013   Procedure: CIRCUMCISION ADULT;  Surgeon: Mark C Ottelin, MD;  Location: [facility];  Service: Urology;  Laterality: N/A;   CRANIOTOMY  1995   hematomy due to sinus infection    CYSTOSCOPY/URETEROSCOPY/HOLMIUM LASER/STENT PLACEMENT Left 12/23/2021   Procedure: LEFT URETEROSCOPY/HOLMIUM LASER LITHOTRIPSY/STENT PLACEMENT retrograde;  Surgeon: Machen, Graham L, MD;  Location: [facility];  Service: Urology;  Laterality: Left;  90 MINUTES NEEDED FOR CASE   PACEMAKER IMPLANT N/A 01/17/2020   Procedure: PACEMAKER IMPLANT;  Surgeon: Klein, Steven C, MD;  Location: [facility];  Service: Cardiovascular;  Laterality: N/A;   RIGHT/LEFT HEART CATH AND CORONARY ANGIOGRAPHY N/A 09/20/2019   Procedure: RIGHT/LEFT HEART CATH AND CORONARY ANGIOGRAPHY;  Surgeon: Bensimhon, Daniel R, MD;  [facility];  Service: Cardiovascular;  Laterality: N/A;   shoulder surg rt   1995   SINUS SURGERY WITH INSTATRAK     ethmoidectomy and nasal septum repair   TEE WITHOUT CARDIOVERSION N/A 09/26/2021   Procedure: TRANSESOPHAGEAL ECHOCARDIOGRAM (TEE);  Surgeon: [place], Tiffany, MD;  Location: [facility];  Service: Cardiovascular;  Laterality: N/A;    TEE WITHOUT CARDIOVERSION N/A 02/03/2022   Procedure: TRANSESOPHAGEAL ECHOCARDIOGRAM (TEE);  Surgeon: Nishan, Peter C, MD;  [facility]: [facility];  Service: Cardiovascular;  Laterality: N/A;   TEE WITHOUT CARDIOVERSION N/A 08/07/2022   Procedure: TRANSESOPHAGEAL ECHOCARDIOGRAM (TEE);  Surgeon: McLean, Dalton S, MD;  Location: [facility];  Service: Cardiovascular;  Laterality: N/A;     Family History  Problem Relation Age of Onset   Heart failure Mother 92   Heart attack Father 58     Social History   Socioeconomic History   Marital status: Widowed    Spouse name: Not on file   Number of children: Not on file   Years of education: Not   on file   Highest education level: Not on file  Occupational History   Not on file  Tobacco Use   Smoking status: Former    Types: Cigarettes    Quit date: 05/04/1986    Years since quitting: 36.3   Smokeless tobacco: Never   Tobacco comments:    Former smoker 02/12/22  Vaping Use   Vaping Use: Never used  Substance and Sexual Activity   Alcohol use: Not Currently   Drug use: No   Sexual activity: Yes  Other Topics Concern   Not on file  Social History Narrative   Not on file   Social Determinants of Health   Financial Resource Strain: Not on file  Food Insecurity: No Food Insecurity (02/01/2022)   Hunger Vital Sign    Worried About Running Out of Food in the Last Year: Never true    Ran Out of Food in the Last Year: Never true  Transportation Needs: No Transportation Needs (02/01/2022)   PRAPARE - Transportation    Lack of Transportation (Medical): No    Lack of Transportation (Non-Medical): No  Physical Activity: Not on file  Stress: Not on file  Social Connections: Not on file  Intimate Partner Violence: Not At Risk (02/01/2022)   Humiliation, Afraid, Rape, and Kick questionnaire    Fear of Current or Ex-Partner: No    Emotionally Abused: No    Physically Abused: No    Sexually Abused: No     BP 120/62   Pulse 74   Ht  5' 7" (1.702 m)   Wt 240 lb (108.9 kg)   SpO2 95%   BMI 37.59 kg/m   Physical Exam:  Well appearing NAD HEENT: Unremarkable Neck:  No JVD, no thyromegally Lymphatics:  No adenopathy Back:  No CVA tenderness Lungs:  Clear with no wheezes HEART:  Regular rate rhythm, no murmurs, no rubs, no clicks Abd:  soft, positive bowel sounds, no organomegally, no rebound, no guarding Ext:  2 plus pulses, no edema, no cyanosis, no clubbing Skin:  No rashes no nodules Neuro:  CN II through XII intact, motor grossly intact  EKG - nsr with ventricular pacing  DEVICE  Normal device function.  See PaceArt for details.   Assess/Plan: Atria lfib with a RVR - his symptoms are paroxysmal and he is on amiodarone which may not be tolerated. I have offered him AV node ablation and the risks/benefits/goals/expectations of the procedure were reviewed and he will call us if he wishes to proceed. Chronic systolic heart failure - his symptoms are class 2, 3 when he is out of rhythm and going fast. Coags - he will hold the coumadin 2 days before his procedure.  Dvaughn Fickle,MD 

## 2022-10-14 NOTE — Discharge Instructions (Signed)

## 2022-10-15 ENCOUNTER — Telehealth: Payer: Self-pay | Admitting: Internal Medicine

## 2022-10-15 ENCOUNTER — Encounter (HOSPITAL_COMMUNITY): Payer: Self-pay | Admitting: Internal Medicine

## 2022-10-15 NOTE — Telephone Encounter (Signed)
Pt daughter called in stating pt is with his nurse with the [place], Misty Stanley and today his bp is low averaging around 97/55. She wants to know if this is cause for concern since he just had an ablation. She states he did not complain of any other symptoms. She ask that you call nurse Misty Stanley back [phone]

## 2022-10-15 NOTE — Telephone Encounter (Signed)
Message received in [facility] Triage.    Misty Stanley RN, [place] called back at [phone];  Ms. Misty Stanley stated Pt BP this morning when woke up at 700 am was 94/75, HR 61 and O2 96%.    At 827 am his BP was 97/55, HR 58, O2 94%.  Pt present when vital signs shared and stated he was asymptomatic, and verbalized no cardiac symptoms, lying, sitting, standing or walking.    Pt and Susa Raring advised to monitor BP and HR, increase fluids with breakfast / lunch, and continue to monitor VS throughout morning and afternoon.  If Pt becomes symptomatic, BP drops lower, or HR changes, contact [facility].  Misty Stanley RN verbalized understanding.

## 2022-10-21 ENCOUNTER — Ambulatory Visit (INDEPENDENT_AMBULATORY_CARE_PROVIDER_SITE_OTHER): Payer: No Typology Code available for payment source | Admitting: *Deleted

## 2022-10-21 DIAGNOSIS — Z5181 Encounter for therapeutic drug level monitoring: Secondary | ICD-10-CM | POA: Diagnosis not present

## 2022-10-21 DIAGNOSIS — I4892 Unspecified atrial flutter: Secondary | ICD-10-CM

## 2022-10-21 DIAGNOSIS — I4891 Unspecified atrial fibrillation: Secondary | ICD-10-CM

## 2022-10-21 LAB — POCT INR: INR: 3.1 — AB (ref 2.0–3.0)

## 2022-10-21 NOTE — Patient Instructions (Signed)
Description   Today take 1 tablet of warfarin then continue warfarin taking 1 tablet daily except 1.5 tablets on Sundays and Wednesdays. Recheck INR in 1 week. Anticoagulation Clinic [phone]

## 2022-10-29 ENCOUNTER — Encounter: Payer: Self-pay | Admitting: Internal Medicine

## 2022-11-02 ENCOUNTER — Ambulatory Visit: Payer: No Typology Code available for payment source

## 2022-11-10 ENCOUNTER — Telehealth: Payer: Self-pay

## 2022-11-10 ENCOUNTER — Ambulatory Visit: Payer: No Typology Code available for payment source | Attending: Cardiology

## 2022-11-10 NOTE — Telephone Encounter (Signed)
Lpmtcb and reschedule INR appt. ?

## 2022-11-12 ENCOUNTER — Ambulatory Visit (HOSPITAL_COMMUNITY)
Admission: RE | Admit: 2022-11-12 | Discharge: 2022-11-12 | Disposition: A | Discharge status: Home / Self Care | Payer: No Typology Code available for payment source | Source: Ambulatory Visit | Attending: Internal Medicine | Admitting: Internal Medicine

## 2022-11-12 ENCOUNTER — Ambulatory Visit (HOSPITAL_BASED_OUTPATIENT_CLINIC_OR_DEPARTMENT_OTHER)
Admission: RE | Admit: 2022-11-12 | Discharge: 2022-11-12 | Disposition: A | Discharge status: Home / Self Care | Payer: No Typology Code available for payment source | Source: Ambulatory Visit | Attending: Internal Medicine | Admitting: Internal Medicine

## 2022-11-12 ENCOUNTER — Encounter (HOSPITAL_COMMUNITY): Payer: Self-pay | Admitting: Internal Medicine

## 2022-11-12 VITALS — BP 104/60 | HR 78 | Wt 247.2 lb

## 2022-11-12 DIAGNOSIS — Z8249 Family history of ischemic heart disease and other diseases of the circulatory system: Secondary | ICD-10-CM | POA: Insufficient documentation

## 2022-11-12 DIAGNOSIS — I2510 Atherosclerotic heart disease of native coronary artery without angina pectoris: Secondary | ICD-10-CM | POA: Insufficient documentation

## 2022-11-12 DIAGNOSIS — Z7901 Long term (current) use of anticoagulants: Secondary | ICD-10-CM | POA: Insufficient documentation

## 2022-11-12 DIAGNOSIS — I5022 Chronic systolic (congestive) heart failure: Secondary | ICD-10-CM

## 2022-11-12 DIAGNOSIS — N183 Chronic kidney disease, stage 3 unspecified: Secondary | ICD-10-CM | POA: Insufficient documentation

## 2022-11-12 DIAGNOSIS — I447 Left bundle-branch block, unspecified: Secondary | ICD-10-CM | POA: Diagnosis not present

## 2022-11-12 DIAGNOSIS — Z8616 Personal history of COVID-19: Secondary | ICD-10-CM | POA: Insufficient documentation

## 2022-11-12 DIAGNOSIS — Z9889 Other specified postprocedural states: Secondary | ICD-10-CM | POA: Insufficient documentation

## 2022-11-12 DIAGNOSIS — Z7984 Long term (current) use of oral hypoglycemic drugs: Secondary | ICD-10-CM | POA: Diagnosis not present

## 2022-11-12 DIAGNOSIS — I5082 Biventricular heart failure: Secondary | ICD-10-CM | POA: Insufficient documentation

## 2022-11-12 DIAGNOSIS — Z950 Presence of cardiac pacemaker: Secondary | ICD-10-CM | POA: Diagnosis not present

## 2022-11-12 DIAGNOSIS — I130 Hypertensive heart and chronic kidney disease with heart failure and stage 1 through stage 4 chronic kidney disease, or unspecified chronic kidney disease: Secondary | ICD-10-CM | POA: Insufficient documentation

## 2022-11-12 DIAGNOSIS — I4891 Unspecified atrial fibrillation: Secondary | ICD-10-CM

## 2022-11-12 DIAGNOSIS — E1122 Type 2 diabetes mellitus with diabetic chronic kidney disease: Secondary | ICD-10-CM | POA: Diagnosis not present

## 2022-11-12 DIAGNOSIS — Z6838 Body mass index (BMI) 38.0-38.9, adult: Secondary | ICD-10-CM | POA: Diagnosis not present

## 2022-11-12 DIAGNOSIS — Z8661 Personal history of infections of the central nervous system: Secondary | ICD-10-CM | POA: Insufficient documentation

## 2022-11-12 DIAGNOSIS — Z87891 Personal history of nicotine dependence: Secondary | ICD-10-CM | POA: Diagnosis not present

## 2022-11-12 DIAGNOSIS — R600 Localized edema: Secondary | ICD-10-CM

## 2022-11-12 DIAGNOSIS — I4892 Unspecified atrial flutter: Secondary | ICD-10-CM | POA: Diagnosis not present

## 2022-11-12 DIAGNOSIS — Z79899 Other long term (current) drug therapy: Secondary | ICD-10-CM | POA: Diagnosis not present

## 2022-11-12 DIAGNOSIS — E785 Hyperlipidemia, unspecified: Secondary | ICD-10-CM | POA: Insufficient documentation

## 2022-11-12 DIAGNOSIS — J449 Chronic obstructive pulmonary disease, unspecified: Secondary | ICD-10-CM | POA: Insufficient documentation

## 2022-11-12 LAB — BASIC METABOLIC PANEL
Anion gap: 15 (ref 5–15)
BUN: 36 mg/dL — ABNORMAL HIGH (ref 8–23)
CO2: 30 mmol/L (ref 22–32)
Calcium: 9.5 mg/dL (ref 8.9–10.3)
Chloride: 95 mmol/L — ABNORMAL LOW (ref 98–111)
Creatinine, Ser: 2.27 mg/dL — ABNORMAL HIGH (ref 0.61–1.24)
GFR, Estimated: 29 mL/min — ABNORMAL LOW (ref >60)
Glucose, Bld: 110 mg/dL — ABNORMAL HIGH (ref 70–99)
Potassium: 4.4 mmol/L (ref 3.5–5.1)
Sodium: 140 mmol/L (ref 135–145)

## 2022-11-12 LAB — PROTIME-INR
INR: 1.9 — ABNORMAL HIGH (ref 0.8–1.2)
Prothrombin Time: 22.2 seconds — ABNORMAL HIGH (ref 11.4–15.2)

## 2022-11-12 LAB — ECHOCARDIOGRAM COMPLETE
Calc EF: 49.5 %
S' Lateral: 3.8 cm
Single Plane A2C EF: 51.8 %
Single Plane A4C EF: 49.4 %

## 2022-11-12 LAB — BRAIN NATRIURETIC PEPTIDE: B Natriuretic Peptide: 54.5 pg/mL (ref 0.0–100.0)

## 2022-11-12 NOTE — Progress Notes (Signed)
  Echocardiogram 2D Echocardiogram has been performed.  [name] 11/12/2022, 8:59 AM

## 2022-11-12 NOTE — Progress Notes (Signed)
ReDS Vest / Clip - 11/12/22 1000       ReDS Vest / Clip   Station Marker B    Ruler Value 35    ReDS Value Range Low volume    ReDS Actual Value 32

## 2022-11-12 NOTE — Progress Notes (Signed)
ADVANCED HF CLINIC NOTE   PCP: [facility] EP: Ricky Hayden HF Cardiologist: Ricky Hayden  HPI: Ricky Hayden is a 77 y.o.male with history of chronic biventricular CHF, LBBB s/p CRT-P in 2021, atrial fibrillation, CKD, COPD, DM, HLD, HTN, pansinusitis complicated by brain abscess and bleeding requiring craniotomy in 1995.    EF 45-50% in 2016, 30-35% in 2017 and 2020. Felt to be d/t combination of LBBB and recurrent AF.   Admitted 4/21 with COVID PNA and HF. Echo repeated and EF back down to 20% w/ severe RV dysfunction.    Underwent CRT-P in September 2021.   He was admitted to [facility] in 12/22 with a/c CHF and atrial flutter. Underwent  DCCV.   Readmitted 04/23 with acute respiratory failure, urosepsis. Echo  EF 40-45%. Course further c/b brief PEA arrest, aspiration PNA/HCAP, and AFL with RVR. Underwent DCCV. TEE 30-35% on TEE.    Admitted 6/23 with pyelo and Jardiance stopped. Had afib with RVR but converted to SR spontaneously.    Admitted 9/23 with acute hypoxic respiratory failure secondary to a/c CHF and AF with RVR. Started on IV amiodarone and IV lasix.  Underwent TEE guided DCCV. TEE EF EF 20-25%, RV severely reduced, moderate TR.    Seen in ED 10/23 with AFL with RVR. EP was consulted. Successfully converted to SR with overdriving pacing. Amiodarone increased to 200 mg BID.  Note felt to be good candidate for PVI ablation.   Returned to ED 03/06/22 with recurrent atrial flutter.   Echo 05/12/22 EF 30-35%   Follow up with EP 3/24, holding off on ablation of PVI or AV node ablation. Amio decreased  Follow up 4/24, volume overloaded, off Jardiance. SGLT2i restarted, torsemide increased to 40 daily.  Admitted 4/24 with WCT 130's, also volume overloaded. Diuresed and started on amio gtt. Underwent TEE/DCCV to NSR.   Underwent AVN ablation on 10/04/22 (CRT-P already in place)  Today he returns for routine f/u with his wfie. Feels ok. Denies  palpitations or CP. Chronic SOB. + chronic LE edema in LLE. Remains on Xarelto. No bleeding. Was able to go to beach last week and walk around a bunch.    ReDS 32%  Cardiac Studies  - TEE/DCCV (4/24): EF < 20%, septal-lateral dyssynchrony consistent with LBBB, RV moderately down  - Echo (1/24): EF 30-35%  - TEE (10/23): EF 20-25%, RV severely reduced, moderate TR, no LAA  - Echo (4/23): EF 40-45%, LV mildly decreased, grade I DD, RV ok  - Echo (4/21): EF 20%, severe RV dysfunction (had COVID PNA)  - Echo (2020): EF 30-35%  - Echo (2017): EF 30-35%  - Echo (2016): EF 45-50%  Past Medical History:  Diagnosis Date   Arthritis    osteoarthritis of left knee   Ascending aorta dilatation (HCC)    Balanitis    recurrent   Cardiac arrhythmia    life threatening, secondary to CCB vs b- blockers   Cardiomyopathy (HCC)    Chronic joint pain    Chronic systolic CHF (congestive heart failure) (HCC)    CKD (chronic kidney disease), stage III (HCC)    COPD (chronic obstructive pulmonary disease) (HCC)    Coronary artery disease    Dilated aortic root (HCC)    Dyspnea    Enlarged prostate    Erectile dysfunction    secondary to Peyronie's disease   GERD (gastroesophageal reflux disease)    Gout    Hiatal hernia    History of kidney  stones    Hyperlipidemia    Hypertension    Intracranial hematoma (HCC) 1995   history of, s/p evacuation by Ricky Hayden   Nocturia    Obesity    Pansinusitis    a.  complicated by brain abscess and bleeding requiring craniotomy in 1995.   PONV (postoperative nausea and vomiting)    Presence of permanent cardiac pacemaker    Sinusitis    s/p ethmoidectomy and nasal septoplasty   Vertigo    intermitantly   Current Outpatient Medications  Medication Sig Dispense Refill   acetaminophen (TYLENOL) 500 MG tablet Take 1,000 mg by mouth every 6 (six) hours as needed for moderate pain.     albuterol (PROVENTIL HFA;VENTOLIN HFA) 108 (90 BASE) MCG/ACT  inhaler Inhale 2 puffs into the lungs every 6 (six) hours as needed for wheezing. 1 Inhaler 11   amiodarone (PACERONE) 200 MG tablet Take 200 mg by mouth daily.     carvedilol (COREG) 6.25 MG tablet TAKE 1 TABLET BY MOUTH TWICE A DAY 180 tablet 1   empagliflozin (JARDIANCE) 10 MG TABS tablet Take 1 tablet (10 mg total) by mouth daily. 90 tablet 3   feeding supplement (ENSURE ENLIVE / ENSURE PLUS) LIQD Take 237 mLs by mouth 2 (two) times daily between meals. (Patient taking differently: Take 237 mLs by mouth daily.) 10000 mL 0   finasteride (PROSCAR) 5 MG tablet TAKE 1/2 TABLET BY MOUTH DAILY 30 tablet 0   fluticasone-salmeterol (WIXELA INHUB) 250-50 MCG/ACT AEPB Inhale 1 puff into the lungs in the morning and at bedtime.     Multiple Vitamins-Minerals (PRESERVISION AREDS 2 PO) Take 1 tablet by mouth in the morning and at bedtime.     pantoprazole (PROTONIX) 40 MG tablet Take 1 tablet (40 mg total) by mouth daily. 30 tablet 0   PHENobarbital (LUMINAL) 64.8 MG tablet Take 1 tablet (64.8 mg total) by mouth 2 (two) times daily. 180 tablet 3   polyethylene glycol (MIRALAX / GLYCOLAX) 17 g packet Take 17 g by mouth 2 (two) times daily. (Patient taking differently: Take 17 g by mouth daily as needed for mild constipation.) 60 each 1   pravastatin (PRAVACHOL) 40 MG tablet TAKE 1 TABLET BY MOUTH EVERY DAY IN THE EVENING 90 tablet 0   sacubitril-valsartan (ENTRESTO) 24-26 MG Take 1 tablet by mouth 2 (two) times daily. Start 08/10/22 60 tablet 8   spironolactone (ALDACTONE) 25 MG tablet Take 0.5 tablets (12.5 mg total) by mouth daily. 90 tablet 3   Tiotropium Bromide Monohydrate (SPIRIVA RESPIMAT) 2.5 MCG/ACT AERS Inhale 2 each into the lungs every evening.     torsemide (DEMADEX) 20 MG tablet Take 40-80 mg by mouth See admin instructions. Take 80 mg in the morning and 40 mg in the evening     Vitamin D3 (VITAMIN D) 25 MCG tablet Take 1 tablet (1,000 Units total) by mouth daily. 60 tablet 0   warfarin (COUMADIN)  3 MG tablet TAKE 1 TABLET DAILY BY MOUTH EXCEPT 1 AND 1/2 TABLETS ON SUNDAYS AND WEDNESDAYS OR AS DIRECTED BY ANTICOAGULATION CLINIC. 100 tablet 3   Torsemide 40 MG TABS Take 40 mg by mouth daily. Take 1 tablet ( 40 mg ) in morning, and 1 tablet ( 40 mg ) after lunch, for three days only;  Then go back to taking: 1 tablet (40 mg) by mouth, daily.  Order by Dr. Lewayne Bunting 09/24/2022 (Patient not taking: Reported on 10/13/2022)     No current facility-administered medications for this  encounter.   Allergies  Allergen Reactions   Calcium Channel Blockers Other (See Comments)    Came to hospital in 1995-caused chest pain    Social History   Socioeconomic History   Marital status: Widowed    Spouse name: Not on file   Number of children: Not on file   Years of education: Not on file   Highest education level: Not on file  Occupational History   Not on file  Tobacco Use   Smoking status: Former    Current packs/day: 0.00    Types: Cigarettes    Quit date: 05/04/1986    Years since quitting: 36.5   Smokeless tobacco: Never   Tobacco comments:    Former smoker 02/12/22  Vaping Use   Vaping status: Never Used  Substance and Sexual Activity   Alcohol use: Not Currently   Drug use: No   Sexual activity: Yes  Other Topics Concern   Not on file  Social History Narrative   Not on file   Social Determinants of Health   Financial Resource Strain: Not on file  Food Insecurity: No Food Insecurity (02/01/2022)   Hunger Vital Sign    Worried About Running Out of Food in the Last Year: Never true    Ran Out of Food in the Last Year: Never true  Transportation Needs: No Transportation Needs (02/01/2022)   PRAPARE - Administrator, Civil Service (Medical): No    Lack of Transportation (Non-Medical): No  Physical Activity: Not on file  Stress: Not on file  Social Connections: Not on file  Intimate Partner Violence: Not At Risk (02/01/2022)   Humiliation, Afraid, Rape, and Kick  questionnaire    Fear of Current or Ex-Partner: No    Emotionally Abused: No    Physically Abused: No    Sexually Abused: No   Family History  Problem Relation Age of Onset   Heart failure Mother 45   Heart attack Father 58   BP 104/60   Pulse 78   Wt 112.1 kg (247 lb 3.2 oz)   SpO2 98%   BMI 38.72 kg/m   Wt Readings from Last 3 Encounters:  11/12/22 112.1 kg (247 lb 3.2 oz)  10/14/22 110.7 kg (244 lb)  10/13/22 111.6 kg (246 lb)   PHYSICAL EXAM: General:  Obese male No resp difficulty HEENT: normal Neck: supple. no JVD. Carotids 2+ bilat; no bruits. No lymphadenopathy or thryomegaly appreciated. Cor: PMI nondisplaced. Regular rate & rhythm. No rubs, gallops or murmurs. Lungs: decreased throughout mild UA wheeze Abdomen: soft, nontender, nondistended. No hepatosplenomegaly. No bruits or masses. Good bowel sounds. Extremities: no cyanosis, clubbing, rash, chronic tr-1+ edema Neuro: alert & orientedx3, cranial nerves grossly intact. moves all 4 extremities w/o difficulty. Affect pleasant  ECG: AF with biv pacing 70 Personally reviewed  ReDs: 32%  St Jude interrogation (personally reviewed): Volume status stable. Chronic AF. BiV pacing >95%  ASSESSMENT & PLAN:  1. Atrial fibrillation/flutter  - Multiple previous admits with Afib and flutter - S/p TEE/DCCV to SR in 10/23. Had recurrent AFL and had overdrive pacing in ED 02/24/22  (saw Dr. Graciela Husbands 3/24) - not felt to be candidate for PVI ablation or AV node ablation - CHA2DS2-VASc score is 6. - On coumadin. Not on DOAC d/t interaction with phenobarbital use (see below). - Failed attempts at rhythm control. s/p AVN ablation 6/24 had CRT-P in place - s/p AVN ablation 6/24 had CRT-P in place - stop amio - Continue warfarin.  INR followed by Coumadin Clinic. CheckINR today - Not interested in sleep study, wears 2L oxygen at night.  - Doing well s/p AVN ablation   2. Chronic systolic CHF  - cardiomyopathy dates back to at  least 2016.  - EF variable over the years. CM previously thought to be d/t LBBB +/- recurrent AF.  - S/p CRT-P (9/21). - Multiple admissions for a/c CHF and recurrent AF this past year - Echo (2021): EF < 20% . LHC with no significant CAD. - Echo (4/23): EF 40-45% - Echo (09/26/21): EF 30-35% in setting of AF - TEE (10/23): EF 20-25%, RV severely reduced, severe BAE, moderate TR - Echo (1/24): EF 30-35% - TEE (4/24): EF < 20% (in setting of AF RVR). - stable NYHA III, functional class confounded by body habitus and COPD - Volume looks good today ReDS 32% - Continue torsemide 80/40 mg daily. - Continue Jardiance 10 mg daily. No GU symptoms. - Continue Coreg 6.25 mg bid.   - Continue spironolactone 12.5 mg daily - Continue Entresto 24/26 mg bid. - Labs today - Repeat echo at next visit to see if EF improves s/p AV node abaltion   3. CKD stage 3 - Baseline SCr 2.3 - On SGLT2i - Labs today  4. H/o brain abscess - S/p craniotomy 2015 - Has been on phenobarbital since 1995, no seizures.  5. COPD - stable  6. Morbid obesity - encouraged weight loss    Arvilla Meres, MD  9:42 AM

## 2022-11-12 NOTE — Patient Instructions (Signed)
There has been no changes to your medications.  Labs done today, your results will be available in MyChart, we will contact you for abnormal readings.  Your physician recommends that you schedule a follow-up appointment in: 3 months  If you have any questions or concerns before your next appointment please send us a message through mychart or call our office at [phone].    TO LEAVE A MESSAGE FOR THE NURSE SELECT OPTION 2, PLEASE LEAVE A MESSAGE INCLUDING: YOUR NAME DATE OF BIRTH CALL BACK NUMBER REASON FOR CALL**this is important as we prioritize the call backs  YOU WILL RECEIVE A CALL BACK THE SAME DAY AS LONG AS YOU CALL BEFORE 4:00 PM  At the [facility], you and your health needs are our priority. As part of our continuing mission to provide you with exceptional heart care, we have created designated Provider Care Teams. These Care Teams include your primary Cardiologist (physician) and Advanced Practice Providers (APPs- Physician Assistants and Nurse Practitioners) who all work together to provide you with the care you need, when you need it.   You may see any of the following providers on your designated Care Team at your next follow up: Dr Daniel Bensimhon Dr Dalton McLean Dr. Aditya Sabharwal Amy Clegg, NP Brittainy Simmons, PA Jessica Milford,NP Lindsay Finch, PA Alma Diaz, NP Lauren Kemp, PharmD   Please be sure to bring in all your medications bottles to every appointment.    Thank you for choosing [facility]    

## 2022-11-15 NOTE — Progress Notes (Signed)
  Electrophysiology Office Note:   ID:  Ricky Hayden, DOB October 25, 1945, MRN [id number]  Primary Cardiologist: Bryan Lemma, MD Electrophysiologist: Sherryl Manges, MD      History of Present Illness:   Ricky Hayden is a 77 y.o. male with h/o COPD, Chronic sysotolic CHF, pansinusitis c/b brain abscess/craniotomy, HTN, HLD, and AFL/AFL seen today for post hospital follow up.    Pt underwent AV nodal ablation 10/14/2022 in the setting of PAF with uncontrolled ventricular response. PPM programmed to 70 bpm.   Since discharge from hospital the patient reports doing about the same. Seen in HF clinic last week, ReDS vest 32%. Getting around ok. Hasn't noticed much different in functional status yet s/p ablation. Has SOB with more than ADLs.  No syncope, CP. Mild LLE edema that has been chronic.   Review of systems complete and found to be negative unless listed in HPI.    EP Information / Studies Reviewed:    EKG is not ordered today. EKG from 7/11 reviewed which showed atrial flutter with V pacing at 70 bpm  PPM Interrogation-  reviewed in detail today,  See PACEART report.  Device information Abbott DDD PPM implanted 01/17/2020 (CS lead attempt aborted) CRT upgrade 01/29/2020 (Left bundle lead in LV port) RV lead is programmed subthreshold   Afib hx Amiodarone started 04/2021 S/p AV nodal ablation 10/2022  Risk Assessment/Calculations:    CHA2DS2-VASc Score = 6   This indicates a 9.7% annual risk of stroke. The patient's score is based upon: CHF History: 1 HTN History: 1 Diabetes History: 1 Stroke History: 0 Vascular Disease History: 1 Age Score: 2 Gender Score: 0    Physical Exam:   VS:  BP 104/60   Pulse 70   Ht 5\' 7"  (1.702 m)   Wt 247 lb 6.4 oz (112.2 kg)   SpO2 98%   BMI 38.75 kg/m    Wt Readings from Last 3 Encounters:  11/16/22 247 lb 6.4 oz (112.2 kg)  11/12/22 247 lb 3.2 oz (112.1 kg)  10/14/22 244 lb (110.7 kg)     GEN: Well nourished, well developed in no  acute distress NECK: No JVD; No carotid bruits CARDIAC: Regular rate and rhythm, no murmurs, rubs, gallops RESPIRATORY:  Upper expiratory wheezing noted. Clear to auscultation without rales, wheezing or rhonchi  ABDOMEN: Obese, Soft, non-tender, non-distended EXTREMITIES:  No edema; No deformity   ASSESSMENT AND PLAN:    Uncontrolled atrial arrhyhtmia s/p AV node ablation s/p Abbott PPM  Normal PPM function See Pace Art report LRL changed to 60 bpm today with rate response on.  Confirmed with Dr. Ladona Ridgel will stop amiodarone.   Chronic systolic CHF Echo 7/11 with poor windows (EF < 20% 08/2022 in setting of AF RVR) On good GDMT per HF team Plan on repeating echo at next HF clinic visit now s/p AV nodal ablation NYHA III symptoms which he states is hear his baseline.   COPD Stable.   Morbid Obesity Body mass index is 38.75 kg/m.  Encouraged lifestyle modification   Disposition:   Follow up with Dr. Ladona Ridgel as scheduled.   Signed, Graciella Freer, PA-C

## 2022-11-16 ENCOUNTER — Telehealth (HOSPITAL_COMMUNITY): Payer: Self-pay

## 2022-11-16 ENCOUNTER — Ambulatory Visit: Payer: No Typology Code available for payment source | Attending: Student | Admitting: Student

## 2022-11-16 ENCOUNTER — Encounter: Payer: Self-pay | Admitting: Student

## 2022-11-16 VITALS — BP 104/60 | HR 70 | Ht 67.0 in | Wt 247.4 lb

## 2022-11-16 DIAGNOSIS — I5022 Chronic systolic (congestive) heart failure: Secondary | ICD-10-CM | POA: Diagnosis not present

## 2022-11-16 DIAGNOSIS — I4891 Unspecified atrial fibrillation: Secondary | ICD-10-CM

## 2022-11-16 DIAGNOSIS — N183 Chronic kidney disease, stage 3 unspecified: Secondary | ICD-10-CM

## 2022-11-16 DIAGNOSIS — I4892 Unspecified atrial flutter: Secondary | ICD-10-CM

## 2022-11-16 DIAGNOSIS — J449 Chronic obstructive pulmonary disease, unspecified: Secondary | ICD-10-CM

## 2022-11-16 LAB — CUP PACEART INCLINIC DEVICE CHECK
Battery Remaining Longevity: 69 mo
Battery Voltage: 2.99 V
Brady Statistic RA Percent Paced: 42 %
Brady Statistic RV Percent Paced: 99.06 %
Date Time Interrogation Session: 20240715114930
Implantable Lead Connection Status: 753985
Implantable Lead Connection Status: 753985
Implantable Lead Connection Status: 753985
Implantable Lead Implant Date: 20210915
Implantable Lead Implant Date: 20210915
Implantable Lead Implant Date: 20210927
Implantable Lead Location: 753858
Implantable Lead Location: 753859
Implantable Lead Location: 753860
Implantable Lead Model: 3830
Implantable Pulse Generator Implant Date: 20210915
Lead Channel Impedance Value: 437.5 Ohm
Lead Channel Impedance Value: 475 Ohm
Lead Channel Impedance Value: 550 Ohm
Lead Channel Pacing Threshold Amplitude: 0.5 V
Lead Channel Pacing Threshold Amplitude: 0.625 V
Lead Channel Pacing Threshold Pulse Width: 0.4 ms
Lead Channel Pacing Threshold Pulse Width: 0.4 ms
Lead Channel Sensing Intrinsic Amplitude: 12 mV
Lead Channel Sensing Intrinsic Amplitude: 5 mV
Lead Channel Setting Pacing Amplitude: 0.25 V
Lead Channel Setting Pacing Amplitude: 2 V
Lead Channel Setting Pacing Amplitude: 2 V
Lead Channel Setting Pacing Pulse Width: 0.05 ms
Lead Channel Setting Pacing Pulse Width: 0.4 ms
Lead Channel Setting Sensing Sensitivity: 2 mV
Pulse Gen Model: 3222
Pulse Gen Serial Number: 9188373

## 2022-11-16 NOTE — Patient Instructions (Signed)
Medication Instructions:  Your physician recommends that you continue on your current medications as directed. Please refer to the Current Medication list given to you today.  *If you need a refill on your cardiac medications before your next appointment, please call your pharmacy*  Lab Work: None ordered If you have labs (blood work) drawn today and your tests are completely normal, you will receive your results only by: MyChart Message (if you have MyChart) OR A paper copy in the mail If you have any lab test that is abnormal or we need to change your treatment, we will call you to review the results.  Follow-Up: At [facility], you and your health needs are our priority.  As part of our continuing mission to provide you with exceptional heart care, we have created designated Provider Care Teams.  These Care Teams include your primary Cardiologist (physician) and Advanced Practice Providers (APPs -  Physician Assistants and Nurse Practitioners) who all work together to provide you with the care you need, when you need it.  Your next appointment:   01/14/23 at 11:30 AM  Provider:   Lewayne Bunting, MD

## 2022-11-16 NOTE — Telephone Encounter (Addendum)
Left message for patient to call office about medications  ----- Message from Arvilla Meres sent at 11/12/2022  4:55 PM EDT ----- Please have him stop amiodarone.

## 2022-11-17 NOTE — Telephone Encounter (Signed)
Left a second message for the patient to call the office. Will also send him a my chart message

## 2022-11-17 NOTE — Telephone Encounter (Signed)
Pt/pts wife aware 

## 2022-11-19 ENCOUNTER — Telehealth (HOSPITAL_COMMUNITY): Payer: Self-pay

## 2022-11-19 NOTE — Telephone Encounter (Signed)
-----   Message from Arvilla Meres sent at 11/12/2022  4:55 PM EDT ----- Please have him stop amiodarone.

## 2022-11-19 NOTE — Telephone Encounter (Signed)
Left message for patient to call office about medication changes.

## 2022-11-24 ENCOUNTER — Telehealth (HOSPITAL_COMMUNITY): Payer: Self-pay

## 2022-11-24 NOTE — Addendum Note (Signed)
Addended by: Theresia Bough on: 11/24/2022 04:16 PM   Modules accepted: Orders

## 2022-11-24 NOTE — Telephone Encounter (Signed)
Pt aware.

## 2022-11-24 NOTE — Telephone Encounter (Addendum)
Left message for patient to call office about medication changes  ----- Message from Arvilla Meres sent at 11/12/2022  4:55 PM EDT ----- Please have him stop amiodarone.

## 2022-12-08 ENCOUNTER — Ambulatory Visit: Payer: No Typology Code available for payment source | Attending: Internal Medicine | Admitting: *Deleted

## 2022-12-08 DIAGNOSIS — Z5181 Encounter for therapeutic drug level monitoring: Secondary | ICD-10-CM | POA: Diagnosis not present

## 2022-12-08 DIAGNOSIS — I4891 Unspecified atrial fibrillation: Secondary | ICD-10-CM | POA: Diagnosis not present

## 2022-12-08 DIAGNOSIS — I4892 Unspecified atrial flutter: Secondary | ICD-10-CM

## 2022-12-08 LAB — POCT INR: INR: 1.7 — AB (ref 2.0–3.0)

## 2022-12-08 NOTE — Patient Instructions (Addendum)
Description   Today take 1.5 tablets of warfarin then continue warfarin taking 1 tablet daily except 1.5 tablets on Sundays and Wednesdays. Reche[facility] [place] [facility] weeks at the [place] & will find new doctors.  Anticoagulation Clinic [phone]

## 2022-12-29 ENCOUNTER — Telehealth: Payer: Self-pay

## 2022-12-29 NOTE — Telephone Encounter (Signed)
Called and spoke with pt's daughter, Marchelle Folks concerning pt's INR. She stated they are taking pt to [facility] today to have INR checked. They have not been able to get pt established with a new PCP or Cardiologist. I made Marchelle Folks aware that checking INR through [facility] and [facility] monitoring INR since pt has now moved is temporary and pt will need to become established with new providers. She verbalized understanding and stated they have been working on getting him transitioned; however, the [place] has been taking a long time.  Will wait for lab results.

## 2022-12-30 ENCOUNTER — Ambulatory Visit (INDEPENDENT_AMBULATORY_CARE_PROVIDER_SITE_OTHER): Payer: No Typology Code available for payment source

## 2022-12-30 DIAGNOSIS — Z5181 Encounter for therapeutic drug level monitoring: Secondary | ICD-10-CM | POA: Diagnosis not present

## 2022-12-30 DIAGNOSIS — I4891 Unspecified atrial fibrillation: Secondary | ICD-10-CM

## 2022-12-30 NOTE — Patient Instructions (Signed)
Description   Today take 2 tablets of warfarin then START taking warfarin taking 1 tablet daily except 1.5 tablets on Sundays, Wednesdays and Friday.  Recheck INR in 2 weeks at the [place] & will find new doctors (No more lab orders, needs to be established with new MD) Anticoagulation Clinic [phone]

## 2022-12-31 NOTE — Telephone Encounter (Signed)
Received call from pt's niece Carollee Herter who stated [facility] has [facility] [place] and current Cardiologist, Dr Herbie Baltimore with [facility]. Carollee Herter asked if our Coumadin Clinic could continue to monitor his INR/Warfarin although he is unable to come to in person appts. I made Carollee Herter aware that since he is unable to come frequent in-office Coumadin Clinic appts, he will need to find PCP or [facility] office locally to have [facility] managed. The lab orders for INR are only temporary until he is able to find physician locally to manage INR.  Carollee Herter verbalized understanding and stated she thinks the [place] locally will be able to manage INR and she will confirm this when she calls back with last lab INR results on 01/12/23.

## 2023-01-12 ENCOUNTER — Telehealth: Payer: Self-pay | Admitting: *Deleted

## 2023-01-12 NOTE — Telephone Encounter (Signed)
Called pt since INR is due. Had to leave a message on the pt's line. Spoke with Marchelle Folks, pt's dtr, and she states her brother took him out to breakfast this morning and they went to the lab afterwards. She states it was probably done around noon or later but not definite on the time. Will await and follow up.

## 2023-01-13 ENCOUNTER — Ambulatory Visit (INDEPENDENT_AMBULATORY_CARE_PROVIDER_SITE_OTHER): Payer: No Typology Code available for payment source

## 2023-01-13 DIAGNOSIS — Z5181 Encounter for therapeutic drug level monitoring: Secondary | ICD-10-CM | POA: Diagnosis not present

## 2023-01-13 LAB — PROTIME-INR
INR: 1.8 — ABNORMAL HIGH (ref 0.9–1.2)
Prothrombin Time: 18.9 s — ABNORMAL HIGH (ref 9.1–12.0)

## 2023-01-13 NOTE — Patient Instructions (Signed)
Description   Today take 2 tablets of warfarin then START taking warfarin taking 1 tablet daily except 1.5 tablets on Sundays, Mondays, Wednesdays and Friday.  Stay consistent with greens each week.  Recheck INR in 2 weeks at the [facility] [place] will now manage INR/Warfarin  Anticoagulation Clinic [phone]

## 2023-01-14 ENCOUNTER — Ambulatory Visit: Payer: No Typology Code available for payment source | Admitting: Internal Medicine

## 2023-02-15 ENCOUNTER — Encounter (HOSPITAL_COMMUNITY): Payer: No Typology Code available for payment source

## 2023-02-23 ENCOUNTER — Ambulatory Visit: Payer: No Typology Code available for payment source | Admitting: Cardiology

## 2023-03-22 ENCOUNTER — Telehealth (HOSPITAL_COMMUNITY): Payer: Self-pay

## 2023-03-22 NOTE — Telephone Encounter (Addendum)
Called and left patient a voice message to confirm/remind patient of their appointment at the [facility] on 03/23/23.   And to bring in all medications and/or complete list.

## 2023-03-22 NOTE — Progress Notes (Signed)
ADVANCED HF CLINIC NOTE   PCP: [facility] EP: Dr. Rosezella [place] HF Cardiologist: Dr. 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    EF 45-50% in 2016, 30-35% in 2017 and 2020. Felt to be d/t combination of LBBB and recurrent AF.   Admitted 4/21 with COVID PNA and HF. Echo repeated and EF back down to 20% w/ severe RV dysfunction.    Underwent CRT-P in September 2021.   He was admitted to [facility] in 12/22 with a/c CHF and atrial flutter. Underwent  DCCV.   Readmitted 04/23 with acute respiratory failure, urosepsis. Echo  EF 40-45%. Course further c/b brief PEA arrest, aspiration PNA/HCAP, and AFL with RVR. Underwent DCCV. TEE 30-35% on TEE.    Admitted 6/23 with pyelo and Jardiance stopped. Had afib with RVR but converted to SR spontaneously.    Admitted 9/23 with acute hypoxic respiratory failure secondary to a/c CHF and AF with RVR. Started on IV amiodarone and IV lasix.  Underwent TEE guided DCCV. TEE EF EF 20-25%, RV severely reduced, moderate TR.    Seen in ED 10/23 with AFL with RVR. EP was consulted. Successfully converted to SR with overdriving pacing. Amiodarone increased to 200 mg BID.  Note felt to be good candidate for PVI ablation.   Returned to ED 03/06/22 with recurrent atrial flutter.   Echo 05/12/22 EF 30-35%.   Follow up with EP 3/24, holding off on ablation of PVI or AV node ablation. Amio decreased  Follow up 4/24, volume overloaded, off Jardiance. SGLT2i restarted, torsemide increased to 40 daily.  Admitted 4/24 with WCT 130's, also volume overloaded. Diuresed and started on amio gtt. Underwent TEE/DCCV to NSR.   Underwent AVN ablation on 10/04/22 (CRT-P already in place).  Echo 7/24 EF 35-40% (on Dr Bensimhon's read)  Today he returns for HF  follow up with his family. Main complaint  is toe pain and feet swelling. He is not SOB walking on flat ground. Denies palpitations, abnormal bleeding, CP, dizziness, or PND/Orthopnea. Appetite ok. No fever or chills. Weight at home 243 pounds. Taking all medications. Staying with his son.  Cardiac Studies - Echo (7/24): EF 35-40% (reviewed with Dr. Gala Romney)  - TEE/DCCV (4/24): EF < 20%, septal-lateral dyssynchrony consistent with LBBB, RV moderately down  - Echo (1/24): EF 30-35%  - TEE (10/23): EF 20-25%, RV severely reduced, moderate TR, no LAA  - Echo (4/23): EF 40-45%, LV mildly decreased, grade I DD, RV ok  - Echo (4/21): EF 20%, severe RV dysfunction (had COVID PNA)  - Echo (2020): EF 30-35%  - Echo (2017): EF 30-35%  - Echo (2016): EF 45-50%  Past Medical History:  Diagnosis Date   Arthritis    osteoarthritis of left knee   Ascending aorta dilatation (HCC)    Balanitis    recurrent   Cardiac arrhythmia    life threatening, secondary to CCB vs b- blockers   Cardiomyopathy (HCC)    Chronic joint pain    Chronic systolic CHF (congestive heart failure) (HCC)    CKD (chronic kidney disease), stage III (HCC)    COPD (chronic obstructive pulmonary disease) (HCC)    Coronary artery disease    Dilated aortic root (HCC)    Dyspnea    Enlarged prostate    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 Newell Coral   Nocturia    Obesity    Pansinusitis    a.  complicated by brain abscess and bleeding requiring craniotomy in 1995.   PONV (postoperative nausea and vomiting)    Presence of permanent cardiac pacemaker    Sinusitis    s/p ethmoidectomy and nasal septoplasty   Vertigo    intermitantly   Current Outpatient Medications  Medication Sig Dispense  Refill   acetaminophen (TYLENOL) 500 MG tablet Take 1,000 mg by mouth every 6 (six) hours as needed for moderate pain.     albuterol (PROVENTIL HFA;VENTOLIN HFA) 108 (90 BASE) MCG/ACT inhaler Inhale 2 puffs into the lungs every 6 (six) hours as needed for wheezing. 1 Inhaler 11   carvedilol (COREG) 6.25 MG tablet TAKE 1 TABLET BY MOUTH TWICE A DAY 180 tablet 1   empagliflozin (JARDIANCE) 10 MG TABS tablet Take 1 tablet (10 mg total) by mouth daily. 90 tablet 3   feeding supplement (ENSURE ENLIVE / ENSURE PLUS) LIQD Take 237 mLs by mouth 2 (two) times daily between meals. (Patient taking differently: Take 237 mLs by mouth daily.) 10000 mL 0   finasteride (PROSCAR) 5 MG tablet TAKE 1/2 TABLET BY MOUTH DAILY 30 tablet 0   fluticasone-salmeterol (WIXELA INHUB) 250-50 MCG/ACT AEPB Inhale 1 puff into the lungs in the morning and at bedtime.     Multiple Vitamins-Minerals (PRESERVISION AREDS 2 PO) Take 1 tablet by mouth in the morning and at bedtime.     pantoprazole (PROTONIX) 40 MG tablet Take 1 tablet (40 mg total) by mouth daily. 30 tablet 0   PHENobarbital (LUMINAL) 64.8 MG tablet Take 1 tablet (64.8 mg total) by mouth 2 (two) times daily. 180 tablet 3   polyethylene glycol (MIRALAX / GLYCOLAX) 17 g packet Take 17 g by mouth 2 (two) times daily. (Patient taking differently: Take 17 g by mouth daily as needed for mild constipation.) 60 each 1   pravastatin (PRAVACHOL) 40 MG tablet TAKE 1 TABLET BY MOUTH EVERY DAY IN THE EVENING 90 tablet 0   sacubitril-valsartan (ENTRESTO) 24-26 MG Take 1 tablet by mouth 2 (two) times daily. Start 08/10/22 60 tablet 8   spironolactone (ALDACTONE) 25 MG tablet Take 0.5 tablets (12.5 mg total) by mouth daily. 90 tablet 3   Tiotropium Bromide Monohydrate (SPIRIVA RESPIMAT) 2.5 MCG/ACT AERS Inhale 2 each into the lungs every evening.     torsemide (DEMADEX) 20 MG tablet Take 40-80 mg by mouth See admin instructions. Take 80 mg in the morning and 40 mg in the evening      Vitamin D3 (VITAMIN D) 25 MCG tablet Take 1 tablet (1,000 Units total) by mouth daily. 60 tablet 0   warfarin (COUMADIN) 3 MG tablet TAKE 1 TABLET DAILY BY MOUTH EXCEPT 1 AND 1/2 TABLETS ON SUNDAYS AND WEDNESDAYS OR AS DIRECTED BY ANTICOAGULATION CLINIC. 100 tablet 3   No current facility-administered medications for this encounter.   Allergies  Allergen Reactions   Calcium Channel Blockers Other (See Comments)    Came to hospital in 1995-caused chest pain    Social History   Socioeconomic History   Marital status: Widowed    Spouse name: Not on file   Number of children:  Not on file   Years of education: Not on file   Highest education level: Not on file  Occupational History   Not on file  Tobacco Use   Smoking status: Former    Current packs/day: 0.00    Types: Cigarettes    Quit date: 05/04/1986    Years since quitting: 36.9   Smokeless tobacco: Never   Tobacco comments:    Former smoker 02/12/22  Vaping Use   Vaping status: Never Used  Substance and Sexual Activity   Alcohol use: Not Currently   Drug use: No   Sexual activity: Yes  Other Topics Concern   Not on file  Social History Narrative   Not on file   Social Determinants of Health   Financial Resource Strain: Not on file  Food Insecurity: No Food Insecurity (02/01/2022)   Hunger Vital Sign    Worried About Running Out of Food in the Last Year: Never true    Ran Out of Food in the Last Year: Never true  Transportation Needs: No Transportation Needs (02/01/2022)   PRAPARE - Administrator, Civil Service (Medical): No    Lack of Transportation (Non-Medical): No  Physical Activity: Not on file  Stress: Not on file  Social Connections: Not on file  Intimate Partner Violence: Not At Risk (02/01/2022)   Humiliation, Afraid, Rape, and Kick questionnaire    Fear of Current or Ex-Partner: No    Emotionally Abused: No    Physically Abused: No    Sexually Abused: No   Family History  Problem  Relation Age of Onset   Heart failure Mother 34   Heart attack Father 58   BP 112/82   Pulse 70   Wt 112.7 kg (248 lb 6.4 oz)   SpO2 97%   BMI 38.90 kg/m   Wt Readings from Last 3 Encounters:  03/23/23 112.7 kg (248 lb 6.4 oz)  11/16/22 112.2 kg (247 lb 6.4 oz)  11/12/22 112.1 kg (247 lb 3.2 oz)   PHYSICAL EXAM: General:  NAD. No resp difficulty, walked into clinic with RW HEENT: Normal Neck: Supple. No JVD. Carotids 2+ bilat; no bruits. No lymphadenopathy or thryomegaly appreciated. Cor: PMI nondisplaced. Regular rate & rhythm. No rubs, gallops or murmurs. Lungs: Clear, diminished throughout Abdomen: Soft, obese, nontender, +distended. No hepatosplenomegaly. No bruits or masses. Good bowel sounds. Extremities: No cyanosis, clubbing, rash, + pedal edema Neuro: Alert & oriented x 3, cranial nerves grossly intact. Moves all 4 extremities w/o difficulty. Affect pleasant.  ReDs: 23%  St Jude interrogation (personally reviewed): CorVue stable but down-trending a bit, chronic AF, BiV pacing 97%  ASSESSMENT & PLAN: 1. Atrial fibrillation/flutter  - Multiple previous admits with Afib and flutter - S/p TEE/DCCV to SR in 10/23. Had recurrent AFL and had overdrive pacing in ED 02/24/22  (saw Dr. Graciela Husbands 3/24) - not felt to be candidate for PVI ablation or AV node ablation - CHA2DS2-VASc score is 6. - On coumadin. Not on DOAC d/t interaction with phenobarbital use (see below). - Failed attempts at rhythm control.  - s/p AVN ablation 6/24 had CRT-P in place - Now off amio - Continue warfarin. INR followed by Coumadin Clinic.  - Not interested in sleep study, wears 2L oxygen at night. - Doing well s/p AVN ablation   2. Chronic systolic CHF  - cardiomyopathy dates back to at least 2016.  - EF variable over the years. CM previously thought to be d/t LBBB +/- recurrent AF.  -  S/p CRT-P (9/21). - Multiple admissions for a/c CHF and recurrent AF this past year - Echo (2021): EF < 20% .  LHC with no significant CAD. - Echo (4/23): EF 40-45% - Echo (09/26/21): EF 30-35% in setting of AF - TEE (10/23): EF 20-25%, RV severely reduced, severe BAE, moderate TR - Echo (1/24): EF 30-35% - TEE (4/24): EF < 20% (in setting of AF RVR). - Echo (7/24): EF 35-40% (on Dr Bensimhon's read) - Stable NYHA II-early III, functional class confounded by body habitus and COPD. - Volume looks good today ReDS 23%, chronic LEE likely venous stasis. - Needs to wear compression hose. - Continue torsemide 80/40 mg daily. - Continue Jardiance 10 mg daily. No GU symptoms. - Continue Coreg 6.25 mg bid.   - Continue spironolactone 12.5 mg daily - Continue Entresto 24/26 mg bid. - Labs today  3. CKD stage 3 - Baseline SCr 2.3 - On SGLT2i. - Labs today  4. H/o brain abscess - S/p craniotomy 2015 - Has been on phenobarbital since 1995, no seizures.  5. COPD - Stable - Wears 2L oxygen at night  6. Obesity - Body mass index is 38.9 kg/m. - Encouraged weight loss   7. Toe pain - Likely due to fluid - Check uric acid  Follow up in 3-4 months with Dr. Gala Romney  Jacklynn Ganong, FNP  1:40 PM

## 2023-03-23 ENCOUNTER — Encounter (HOSPITAL_COMMUNITY): Payer: Self-pay

## 2023-03-23 ENCOUNTER — Ambulatory Visit (HOSPITAL_COMMUNITY)
Admission: RE | Admit: 2023-03-23 | Discharge: 2023-03-23 | Disposition: A | Discharge status: Home / Self Care | Payer: No Typology Code available for payment source | Source: Ambulatory Visit | Attending: Family Medicine | Admitting: Family Medicine

## 2023-03-23 VITALS — BP 112/82 | HR 70 | Wt 248.4 lb

## 2023-03-23 DIAGNOSIS — I4891 Unspecified atrial fibrillation: Secondary | ICD-10-CM | POA: Diagnosis not present

## 2023-03-23 DIAGNOSIS — J449 Chronic obstructive pulmonary disease, unspecified: Secondary | ICD-10-CM

## 2023-03-23 DIAGNOSIS — G060 Intracranial abscess and granuloma: Secondary | ICD-10-CM

## 2023-03-23 DIAGNOSIS — Z7901 Long term (current) use of anticoagulants: Secondary | ICD-10-CM | POA: Diagnosis not present

## 2023-03-23 DIAGNOSIS — N183 Chronic kidney disease, stage 3 unspecified: Secondary | ICD-10-CM | POA: Diagnosis not present

## 2023-03-23 DIAGNOSIS — I5022 Chronic systolic (congestive) heart failure: Secondary | ICD-10-CM | POA: Diagnosis not present

## 2023-03-23 DIAGNOSIS — I447 Left bundle-branch block, unspecified: Secondary | ICD-10-CM | POA: Insufficient documentation

## 2023-03-23 DIAGNOSIS — Z6838 Body mass index (BMI) 38.0-38.9, adult: Secondary | ICD-10-CM | POA: Insufficient documentation

## 2023-03-23 DIAGNOSIS — M79674 Pain in right toe(s): Secondary | ICD-10-CM | POA: Diagnosis not present

## 2023-03-23 DIAGNOSIS — E669 Obesity, unspecified: Secondary | ICD-10-CM

## 2023-03-23 DIAGNOSIS — J324 Chronic pansinusitis: Secondary | ICD-10-CM | POA: Diagnosis not present

## 2023-03-23 DIAGNOSIS — I4892 Unspecified atrial flutter: Secondary | ICD-10-CM | POA: Insufficient documentation

## 2023-03-23 DIAGNOSIS — I130 Hypertensive heart and chronic kidney disease with heart failure and stage 1 through stage 4 chronic kidney disease, or unspecified chronic kidney disease: Secondary | ICD-10-CM | POA: Insufficient documentation

## 2023-03-23 DIAGNOSIS — Z8661 Personal history of infections of the central nervous system: Secondary | ICD-10-CM | POA: Insufficient documentation

## 2023-03-23 LAB — BASIC METABOLIC PANEL
Anion gap: 6 (ref 5–15)
BUN: 22 mg/dL (ref 8–23)
CO2: 32 mmol/L (ref 22–32)
Calcium: 9.1 mg/dL (ref 8.9–10.3)
Chloride: 103 mmol/L (ref 98–111)
Creatinine, Ser: 1.63 mg/dL — ABNORMAL HIGH (ref 0.61–1.24)
GFR, Estimated: 43 mL/min — ABNORMAL LOW (ref >60)
Glucose, Bld: 88 mg/dL (ref 70–99)
Potassium: 4.6 mmol/L (ref 3.5–5.1)
Sodium: 141 mmol/L (ref 135–145)

## 2023-03-23 LAB — URIC ACID: Uric Acid, Serum: 10.1 mg/dL — ABNORMAL HIGH (ref 3.7–8.6)

## 2023-03-23 LAB — BRAIN NATRIURETIC PEPTIDE: B Natriuretic Peptide: 32.3 pg/mL (ref 0.0–100.0)

## 2023-03-23 NOTE — Progress Notes (Signed)
ReDS Vest / Clip - 03/23/23 1300       ReDS Vest / Clip   Station Marker D    Ruler Value 35    ReDS Value Range Low volume    ReDS Actual Value 23

## 2023-03-23 NOTE — Patient Instructions (Signed)
No change in medications. Labs today - will call you if abnormal. Wear your compression hose. Return to see Dr. Gala Romney in 3 - 4 months. Please call [phone] to schedule this appointment in January. Please call us at [phone] if any questions or concerns prior to your next appointment.

## 2023-04-07 ENCOUNTER — Encounter: Payer: Self-pay | Admitting: Internal Medicine

## 2023-04-29 ENCOUNTER — Ambulatory Visit: Payer: No Typology Code available for payment source | Attending: Internal Medicine | Admitting: Internal Medicine

## 2023-04-29 ENCOUNTER — Encounter: Payer: Self-pay | Admitting: Internal Medicine

## 2023-04-29 DIAGNOSIS — I4892 Unspecified atrial flutter: Secondary | ICD-10-CM

## 2023-04-29 DIAGNOSIS — I447 Left bundle-branch block, unspecified: Secondary | ICD-10-CM | POA: Diagnosis not present

## 2023-04-29 DIAGNOSIS — I428 Other cardiomyopathies: Secondary | ICD-10-CM | POA: Diagnosis not present

## 2023-04-29 DIAGNOSIS — I4819 Other persistent atrial fibrillation: Secondary | ICD-10-CM

## 2023-04-29 DIAGNOSIS — I5042 Chronic combined systolic (congestive) and diastolic (congestive) heart failure: Secondary | ICD-10-CM

## 2023-04-29 NOTE — Patient Instructions (Signed)
Medication Instructions:  Your physician recommends that you continue on your current medications as directed. Please refer to the Current Medication list given to you today.  *If you need a refill on your cardiac medications before your next appointment, please call your pharmacy*  Lab Work: None ordered.  If you have labs (blood work) drawn today and your tests are completely normal, you will receive your results only by: MyChart Message (if you have MyChart) OR A paper copy in the mail If you have any lab test that is abnormal or we need to change your treatment, we will call you to review the results.  Testing/Procedures: None ordered.  Follow-Up: At [facility], you and your health needs are our priority.  As part of our continuing mission to provide you with exceptional heart care, we have created designated Provider Care Teams.  These Care Teams include your primary Cardiologist (physician) and Advanced Practice Providers (APPs -  Physician Assistants and Nurse Practitioners) who all work together to provide you with the care you need, when you need it.   Your next appointment:   1 year(s)  The format for your next appointment:   In Person  Provider:   Dr Cherly Beach, or one of the following Advanced Practice Providers on your designated Care Team:   Francis Dowse, PA-C Casimiro Needle "Mardelle Matte" [employer], [place] Earnest Rosier, NP    Important Information About Sugar

## 2023-04-29 NOTE — Progress Notes (Signed)
HPI [name] is s/p AV node ablation. The patient is a pleasant 77 yo man with a h/o chronic systolic heart failure, LBBB, he has been placed on amiodarone. He has had recurrent atrial fib with a RVR. He is s/p AV node ablation. His paced QRS is 100 ms. [place] and [place]. His palpitations are much improved. He is still sedentary.  Allergies  Allergen Reactions   Calcium Channel Blockers Other (See Comments)    Came to hospital in 1995-caused chest pain      Current Outpatient Medications  Medication Sig Dispense Refill   acetaminophen (TYLENOL) 500 MG tablet Take 1,000 mg by mouth every 6 (six) hours as needed for moderate pain.     albuterol (PROVENTIL HFA;VENTOLIN HFA) 108 (90 BASE) MCG/ACT inhaler Inhale 2 puffs into the lungs every 6 (six) hours as needed for wheezing. 1 Inhaler 11   carvedilol (COREG) 6.25 MG tablet TAKE 1 TABLET BY MOUTH TWICE A DAY 180 tablet 1   empagliflozin (JARDIANCE) 10 MG TABS tablet Take 1 tablet (10 mg total) by mouth daily. 90 tablet 3   feeding supplement (ENSURE ENLIVE / ENSURE PLUS) LIQD Take 237 mLs by mouth 2 (two) times daily between meals. (Patient taking differently: Take 237 mLs by mouth daily.) 10000 mL 0   finasteride (PROSCAR) 5 MG tablet TAKE 1/2 TABLET BY MOUTH DAILY 30 tablet 0   fluticasone-salmeterol (WIXELA INHUB) 250-50 MCG/ACT AEPB Inhale 1 puff into the lungs in the morning and at bedtime.     Multiple Vitamins-Minerals (PRESERVISION AREDS 2 PO) Take 1 tablet by mouth in the morning and at bedtime.     pantoprazole (PROTONIX) 40 MG tablet Take 1 tablet (40 mg total) by mouth daily. 30 tablet 0   PHENobarbital (LUMINAL) 64.8 MG tablet Take 1 tablet (64.8 mg total) by mouth 2 (two) times daily. 180 tablet 3   polyethylene glycol (MIRALAX / GLYCOLAX) 17 g packet Take 17 g by mouth 2 (two) times daily. (Patient taking differently: Take 17 g by mouth daily as needed for mild constipation.) 60 each 1    pravastatin (PRAVACHOL) 40 MG tablet TAKE 1 TABLET BY MOUTH EVERY DAY IN THE EVENING 90 tablet 0   sacubitril-valsartan (ENTRESTO) 24-26 MG Take 1 tablet by mouth 2 (two) times daily. Start 08/10/22 60 tablet 8   spironolactone (ALDACTONE) 25 MG tablet Take 0.5 tablets (12.5 mg total) by mouth daily. 90 tablet 3   Tiotropium Bromide Monohydrate (SPIRIVA RESPIMAT) 2.5 MCG/ACT AERS Inhale 2 each into the lungs every evening.     torsemide (DEMADEX) 20 MG tablet Take 40-80 mg by mouth See admin instructions. Take 80 mg in the morning and 40 mg in the evening     Vitamin D3 (VITAMIN D) 25 MCG tablet Take 1 tablet (1,000 Units total) by mouth daily. 60 tablet 0   warfarin (COUMADIN) 3 MG tablet TAKE 1 TABLET DAILY BY MOUTH EXCEPT 1 AND 1/2 TABLETS ON SUNDAYS AND WEDNESDAYS OR AS DIRECTED BY ANTICOAGULATION CLINIC. 100 tablet 3   No current facility-administered medications for this visit.     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 Newell Coral   Nocturia    Obesity    Pansinusitis    a.  complicated by brain abscess and bleeding requiring craniotomy in 1995.   PONV (postoperative nausea and vomiting)    Presence of permanent cardiac pacemaker    Sinusitis    s/p  ethmoidectomy and nasal septoplasty   Vertigo    intermitantly    ROS:   All systems reviewed and negative except as noted in the HPI.   Past Surgical History:  Procedure Laterality Date   AV NODE ABLATION N/A 10/14/2022   Procedure: AV NODE ABLATION;  Surgeon: Marinus Maw, MD;  Location: [facility] INVASIVE CV LAB;  Service: Cardiovascular;  Laterality: N/A;   BIV UPGRADE N/A 01/29/2020   Procedure: BIV UPGRADE;  Surgeon: Marinus Maw, MD;  Location: [facility] INVAS[facility] CV [facility];  Service: Cardiovascular;  Laterality: N/A;   CARDIOVERSION N/A 09/26/2021   Procedure: CARDIOVERSION;  Surgeon: Chilton Si, MD;  Location: [facility] ENDOSCOPY;  Service: Cardiovascular;  Laterality: N/A;   CARDIOVERSION N/A 02/03/2022   Procedure: CARDIOVERSION;  Surgeon: Wendall Stade, MD;  Location: [facility] ENDOSCOPY;  Service: Cardiovascular;  Laterality: N/A;   CARDIOVERSION N/A 08/07/2022   Procedure: CARDIOVERSION;  Surgeon: Laurey Morale, MD;  Location: [facility] ENDOSCOPY;  Service: Cardiovascular;  Laterality: N/A;   CIRCUMCISION N/A 11/20/2013   Procedure: CIRCUMCISION ADULT;  Surgeon: Garnett Farm, MD;  Location: [facility] ORS;  Service: Urology;  Laterality: N/A;   CRANIOTOMY  1995   hematomy due to sinus infection    CYSTOSCOPY/URETEROSCOPY/HOLMIUM LASER/STENT PLACEMENT Left 12/23/2021   Procedure: LEFT URETEROSCOPY/HOLMIUM LASER LITHOTRIPSY/STENT PLACEMENT retrograde;  Surgeon: Despina Arias, MD;  Location: [facility] ORS;  Service: Urology;  Laterality: Left;  90 MINUTES NEEDED FOR CASE   PACEMAKER IMPLANT N/A 01/17/2020   Procedure: PACEMAKER IMPLANT;  Surgeon: Duke Salvia, MD;  [facility] INVASIVE CV LAB;  Service: Cardiovascular;  Laterality: N/A;   RIGHT/LEFT HEART CATH AND CORONARY ANGIOGRAPHY N/A 09/20/2019   Procedure: RIGHT/LEFT HEART CATH AND CORONARY ANGIOGRAPHY;  Surgeon: Dolores Patty, MD;  Location: [facility] INVASIVE CV LAB;  Service: Cardiovascular;  Laterality: N/A;   shoulder surg rt   1995   SINUS SURGERY  WITH INSTATRAK     ethmoidectomy and nasal septum repair   TEE WITHOUT CARDIOVERSION N/A 09/26/2021   Procedure: TRANSESOPHAGEAL ECHOCARDIOGRAM (TEE);  Surgeon: Chilton Si, MD;  Location: [facility] ENDOSCOPY;  Service: Cardiovascular;  Laterality: N/A;   TEE WITHOUT CARDIOVERSION N/A 02/03/2022   Procedure: TRANSESOPHAGEAL ECHOCARDIOGRAM (TEE);  Surgeon: Wendall Stade, MD;  Location: [facility] ENDOSCOPY;  Service: Cardiovascular;  Laterality: N/A;   TEE WITHOUT CARDIOVERSION N/A 08/07/2022   Procedure: TRANSESOPHAGEAL ECHOCARDIOGRAM (TEE);  Surgeon: Laurey Morale, MD;  Location: [facility] ENDOSCOPY;  Service: Cardiovascular;  Laterality: N/A;     Family History  Problem Relation Age of Onset   Heart failure Mother 17   Heart  attack Father 23     Social History   Socioeconomic History   Marital status: Widowed    Spouse name: Not on file   Number of children: Not on file   Years of education: Not on file   Highest education level: Not on file  Occupational History   Not on file  Tobacco Use   Smoking status: Former    Current packs/day: 0.00    Types: Cigarettes    Quit date: 05/04/1986    Years since quitting: 37.0   Smokeless tobacco: Never   Tobacco comments:    Former smoker 02/12/22  Vaping Use   Vaping status: Never Used  Substance and Sexual Activity   Alcohol use: Not Currently   Drug use: No   Sexual activity: Yes  Other Topics Concern   Not on file  Social History Narrative   Not on file   Social Drivers of Health   Financial Resource Strain: Not on file  Food Insecurity: No Food Insecurity (02/01/2022)   Hunger Vital Sign    Worried About Running Out of Food in the Last Year: Never true    Ran Out of Food in the Last Year: Never true  Transportation Needs: No Transportation Needs (02/01/2022)   PRAPARE - Administrator, Civil Service (Medical): No    Lack of Transportation (Non-Medical): No  Physical Activity: Not on file  Stress: Not on file  Social  Connections: Not on file  Intimate Partner Violence: Not At Risk (02/01/2022)   Humiliation, Afraid, Rape, and Kick questionnaire    Fear of Current or Ex-Partner: No    Emotionally Abused: No    Physically Abused: No    Sexually Abused: No     BP 104/70 (BP Location: Left Arm, Patient Position: Sitting, Cuff Size: Large)   Pulse 72   Ht 5\' 7"  (1.702 m)   Wt 246 lb (111.6 kg)   SpO2 97%   BMI 38.53 kg/m   Physical Exam:  Well appearing NAD HEENT: Unremarkable Neck:  No JVD, no thyromegally Lymphatics:  No adenopathy Back:  No CVA tenderness Lungs:  Clear with no wheezes HEART:  Regular rate rhythm, no murmurs, no rubs, no clicks Abd:  soft, positive bowel sounds, no organomegally, no rebound, no guarding Ext:  2 plus pulses, no edema, no cyanosis, no clubbing Skin:  No rashes no nodules Neuro:  CN II through XII intact, motor grossly intact  EKG - atrial flutter with a controlled VR with ventricular pacing  DEVICE  Normal device function.  See PaceArt for details.   Assess/Plan:  Atrial fib/flutter with a RVR - he appears to be tolerating Chronic systolic heart failure - his symptoms are class 2, 3 when he is out of rhythm and going fast. Coags - he will continue his current meds. Once he weans off of phenobarb, he will switch from warfarin to eliquis 5 bid.   Sharlot Gowda Tyrea Froberg,MD

## 2023-05-14 ENCOUNTER — Telehealth: Payer: Self-pay | Admitting: Cardiology

## 2023-05-14 ENCOUNTER — Other Ambulatory Visit: Payer: Self-pay

## 2023-05-14 MED ORDER — SPIRONOLACTONE 25 MG PO TABS: 12.5000 mg | ORAL_TABLET | Freq: Every day | ORAL | 3 refills | Status: DC

## 2023-05-14 MED ORDER — ENTRESTO 24-26 MG PO TABS: 1.0000 | ORAL_TABLET | Freq: Two times a day (BID) | ORAL | 3 refills | Status: DC

## 2023-05-14 MED ORDER — CARVEDILOL 6.25 MG PO TABS: 6.2500 mg | ORAL_TABLET | Freq: Two times a day (BID) | ORAL | 3 refills | Status: DC

## 2023-05-14 NOTE — Telephone Encounter (Signed)
*  STAT* If patient is at the pharmacy, call can be transferred to refill team.   1. Which medications need to be refilled? (please list name of each medication and dose if known) spironolactone  (ALDACTONE ) 25 MG tablet   sacubitril -valsartan  (ENTRESTO ) 24-26 MG   carvedilol  (COREG ) 6.25 MG tablet      2. Which pharmacy/location (including street and city if local pharmacy) is medication to be sent to? [facility] - [place], [place] - [address]   3. Do they need a 30 day or 90 day supply? 90

## 2023-10-07 ENCOUNTER — Telehealth (HOSPITAL_COMMUNITY): Payer: Self-pay | Admitting: Internal Medicine

## 2023-10-14 ENCOUNTER — Telehealth (HOSPITAL_COMMUNITY): Payer: Self-pay

## 2023-10-14 NOTE — Telephone Encounter (Signed)
 Called to confirm/remind patient of their appointment at the [facility] on 6/13/025 3:00.   Appointment:   [] Confirmed  [] Left mess   [x] No answer/No voice mail  [] VM Full/unable to leave message  [] Phone not in service  Patient reminded to bring all medications and/or complete list.  Confirmed patient has transportation. Gave directions, instructed to utilize valet parking.

## 2023-10-15 ENCOUNTER — Ambulatory Visit (HOSPITAL_COMMUNITY)
Admission: RE | Admit: 2023-10-15 | Discharge: 2023-10-15 | Disposition: A | Discharge status: Home / Self Care | Source: Ambulatory Visit | Attending: Adult Health | Admitting: Adult Health

## 2023-10-15 ENCOUNTER — Encounter (HOSPITAL_COMMUNITY): Payer: Self-pay

## 2023-10-15 VITALS — BP 95/56 | HR 70 | Wt 228.0 lb

## 2023-10-15 DIAGNOSIS — I4892 Unspecified atrial flutter: Secondary | ICD-10-CM

## 2023-10-15 DIAGNOSIS — Z9981 Dependence on supplemental oxygen: Secondary | ICD-10-CM | POA: Insufficient documentation

## 2023-10-15 DIAGNOSIS — Z8616 Personal history of COVID-19: Secondary | ICD-10-CM | POA: Diagnosis not present

## 2023-10-15 DIAGNOSIS — I2510 Atherosclerotic heart disease of native coronary artery without angina pectoris: Secondary | ICD-10-CM | POA: Diagnosis not present

## 2023-10-15 DIAGNOSIS — I130 Hypertensive heart and chronic kidney disease with heart failure and stage 1 through stage 4 chronic kidney disease, or unspecified chronic kidney disease: Secondary | ICD-10-CM | POA: Diagnosis not present

## 2023-10-15 DIAGNOSIS — I4819 Other persistent atrial fibrillation: Secondary | ICD-10-CM

## 2023-10-15 DIAGNOSIS — Z95810 Presence of automatic (implantable) cardiac defibrillator: Secondary | ICD-10-CM

## 2023-10-15 DIAGNOSIS — I447 Left bundle-branch block, unspecified: Secondary | ICD-10-CM | POA: Insufficient documentation

## 2023-10-15 DIAGNOSIS — Z7901 Long term (current) use of anticoagulants: Secondary | ICD-10-CM | POA: Insufficient documentation

## 2023-10-15 DIAGNOSIS — I5022 Chronic systolic (congestive) heart failure: Secondary | ICD-10-CM | POA: Diagnosis not present

## 2023-10-15 DIAGNOSIS — Z9889 Other specified postprocedural states: Secondary | ICD-10-CM | POA: Diagnosis not present

## 2023-10-15 DIAGNOSIS — N183 Chronic kidney disease, stage 3 unspecified: Secondary | ICD-10-CM | POA: Diagnosis not present

## 2023-10-15 DIAGNOSIS — I4821 Permanent atrial fibrillation: Secondary | ICD-10-CM | POA: Diagnosis present

## 2023-10-15 DIAGNOSIS — Z8661 Personal history of infections of the central nervous system: Secondary | ICD-10-CM | POA: Insufficient documentation

## 2023-10-15 DIAGNOSIS — I5042 Chronic combined systolic (congestive) and diastolic (congestive) heart failure: Secondary | ICD-10-CM | POA: Diagnosis not present

## 2023-10-15 DIAGNOSIS — Z950 Presence of cardiac pacemaker: Secondary | ICD-10-CM | POA: Diagnosis not present

## 2023-10-15 DIAGNOSIS — I5082 Biventricular heart failure: Secondary | ICD-10-CM | POA: Insufficient documentation

## 2023-10-15 DIAGNOSIS — E669 Obesity, unspecified: Secondary | ICD-10-CM | POA: Diagnosis not present

## 2023-10-15 DIAGNOSIS — E1122 Type 2 diabetes mellitus with diabetic chronic kidney disease: Secondary | ICD-10-CM | POA: Diagnosis not present

## 2023-10-15 DIAGNOSIS — Z79899 Other long term (current) drug therapy: Secondary | ICD-10-CM | POA: Insufficient documentation

## 2023-10-15 DIAGNOSIS — Z6835 Body mass index (BMI) 35.0-35.9, adult: Secondary | ICD-10-CM | POA: Insufficient documentation

## 2023-10-15 DIAGNOSIS — Z7984 Long term (current) use of oral hypoglycemic drugs: Secondary | ICD-10-CM | POA: Insufficient documentation

## 2023-10-15 DIAGNOSIS — E785 Hyperlipidemia, unspecified: Secondary | ICD-10-CM | POA: Diagnosis not present

## 2023-10-15 DIAGNOSIS — J449 Chronic obstructive pulmonary disease, unspecified: Secondary | ICD-10-CM | POA: Insufficient documentation

## 2023-10-15 LAB — BRAIN NATRIURETIC PEPTIDE: B Natriuretic Peptide: 40.6 pg/mL (ref 0.0–100.0)

## 2023-10-15 LAB — PROTIME-INR
INR: 2.3 — ABNORMAL HIGH (ref 0.8–1.2)
Prothrombin Time: 25.1 s — ABNORMAL HIGH (ref 11.4–15.2)

## 2023-10-15 LAB — BASIC METABOLIC PANEL WITH GFR
Anion gap: 11 (ref 5–15)
BUN: 43 mg/dL — ABNORMAL HIGH (ref 8–23)
CO2: 31 mmol/L (ref 22–32)
Calcium: 10 mg/dL (ref 8.9–10.3)
Chloride: 100 mmol/L (ref 98–111)
Creatinine, Ser: 2.1 mg/dL — ABNORMAL HIGH (ref 0.61–1.24)
GFR, Estimated: 31 mL/min — ABNORMAL LOW (ref >60)
Glucose, Bld: 114 mg/dL — ABNORMAL HIGH (ref 70–99)
Potassium: 4.7 mmol/L (ref 3.5–5.1)
Sodium: 142 mmol/L (ref 135–145)

## 2023-10-15 MED ORDER — APIXABAN 5 MG PO TABS: 5.0000 mg | ORAL_TABLET | Freq: Two times a day (BID) | ORAL | 11 refills | Status: AC

## 2023-10-15 NOTE — Progress Notes (Signed)
 AD[facility] CLINIC NOTE   PCP: [facility] EP: Dr. Harrietta Lime HF Cardiologist: Dr. Julane Ny  HPI: Ricky Hayden is a 78 y.o.male with history of chronic biventricular CHF, LBBB s/p CRT-P in 2021, atrial fibrillation, CKD, COPD, DM, HLD, HTN, pansinusitis complicated by brain abscess and bleeding requiring craniotomy in 1995.    EF 45-50% in 2016, 30-35% in 2017 and 2020. Felt to be d/t combination of LBBB and recurrent AF.   Admitted 4/21 with COVID PNA and HF. Echo repeated and EF back down to 20% w/ severe RV dysfunction.    Underwent CRT-P in September 2021.   He was admitted to [facility] in 12/22 with a/c CHF and atrial flutter. Underwent  DCCV.   Readmitted 04/23 with acute respiratory failure, urosepsis. Echo  EF 40-45%. Course further c/b brief PEA arrest, aspiration PNA/HCAP, and AFL with RVR. Underwent DCCV. TEE 30-35% on TEE.    Admitted 9/23 with acute hypoxic respiratory failure secondary to a/c CHF and AF with RVR. Started on IV amiodarone  and IV lasix .  Underwent TEE guided DCCV. TEE EF EF 20-25%, RV severely reduced, moderate TR.    Seen in ED 10/23 with AFL with RVR. EP was consulted. Successfully converted to SR with overdriving pacing. Amiodarone  increased to 200 mg BID.  Note felt to be good candidate for PVI ablation. Returned to ED 03/06/22 with recurrent atrial flutter.   Echo 05/12/22 EF 30-35%.   Admitted 4/24 with WCT 130's, also volume overloaded. Diuresed and started on amio gtt. Underwent TEE/DCCV to NSR.   Underwent AVN ablation on 10/04/22 (CRT-P already in place).  Echo 7/24 EF 35-40%   Today he returns for HF follow up with his family. Says he's doing. Gets around with [employer]. Uses O2 at night. No swelling, orthopnea or PND. Has lost over 20 pounds Compliant with meds. More energy than before   Cardiac Studies - Echo (7/24): EF 35-40% - TEE/DCCV (4/24): EF < 20%, septal-lateral dyssynchrony consistent with LBBB, RV moderately  down - Echo (1/24): EF 30-35% - TEE (10/23): EF 20-25%, RV severely reduced, moderate TR, no LAA - Echo (4/23): EF 40-45%, LV mildly decreased, grade I DD, RV ok - Echo (4/21): EF 20%, severe RV dysfunction (had COVID PNA) - Echo (2020): EF 30-35% - Echo (2016): EF 45-50%  Past Medical History:  Diagnosis Date   Arthritis    osteoarthritis of left knee   Ascending aorta dilatation (HCC)    Balanitis    recurrent   Cardiac arrhythmia    life threatening, secondary to CCB vs b- blockers   Cardiomyopathy (HCC)    Chronic joint pain    Chronic systolic CHF (congestive heart failure) (HCC)    CKD (chronic kidney disease), stage III (HCC)    COPD (chronic obstructive pulmonary disease) (HCC)    Coronary artery disease    Dilated aortic root (HCC)    Dyspnea    Enlarged prostate    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 Cipriano Creeks   Nocturia    Obesity    Pansinusitis    a.  complicated by brain abscess and bleeding requiring craniotomy in 1995.   PONV (postoperative nausea and vomiting)    Presence of  permanent cardiac pacemaker    Sinusitis    s/p ethmoidectomy and nasal septoplasty   Vertigo    intermitantly   Current Outpatient Medications  Medication Sig Dispense Refill   acetaminophen  (TYLENOL ) 500 MG tablet Take 1,000 mg by mouth every 6 (six) hours as needed for moderate pain.     albuterol  (PROVENTIL  HFA;VENTOLIN  HFA) 108 (90 BASE) MCG/ACT inhaler Inhale 2 puffs into the lungs every 6 (six) hours as needed for wheezing. 1 Inhaler 11   carvedilol  (COREG ) 6.25 MG tablet Take 1 tablet (6.25 mg total) by mouth 2 (two) times daily. 180 tablet 3   feeding supplement (ENSURE ENLIVE / ENSURE PLUS) LIQD Take 237 mLs by mouth 2 (two) times daily between meals.  10000 mL 0   finasteride  (PROSCAR ) 5 MG tablet TAKE 1/2 TABLET BY MOUTH DAILY 30 tablet 0   fluticasone -salmeterol (WIXELA INHUB) 250-50 MCG/ACT AEPB Inhale 1 puff into the lungs in the morning and at bedtime.     Multiple Vitamins-Minerals (PRESERVISION AREDS 2 PO) Take 1 tablet by mouth in the morning and at bedtime.     pantoprazole  (PROTONIX ) 40 MG tablet Take 1 tablet (40 mg total) by mouth daily. 30 tablet 0   pravastatin  (PRAVACHOL ) 40 MG tablet TAKE 1 TABLET BY MOUTH EVERY DAY IN THE EVENING 90 tablet 0   sacubitril -valsartan  (ENTRESTO ) 24-26 MG Take 1 tablet by mouth 2 (two) times daily. Start 08/10/22 180 tablet 3   spironolactone  (ALDACTONE ) 25 MG tablet Take 0.5 tablets (12.5 mg total) by mouth daily. 45 tablet 3   Tiotropium Bromide  Monohydrate (SPIRIVA  RESPIMAT) 2.5 MCG/ACT AERS Inhale 2 each into the lungs every evening.     torsemide  (DEMADEX ) 20 MG tablet Take 40-80 mg by mouth See admin instructions. Take 80 mg in the morning and 40 mg in the evening     Vitamin D3 (VITAMIN D ) 25 MCG tablet Take 1 tablet (1,000 Units total) by mouth daily. 60 tablet 0   warfarin (COUMADIN ) 3 MG tablet TAKE 1 TABLET DAILY BY MOUTH EXCEPT 1 AND 1/2 TABLETS ON SUNDAYS AND WEDNESDAYS OR AS DIRECTED BY ANTICOAGULATION CLINIC. 100 tablet 3   empagliflozin  (JARDIANCE ) 10 MG TABS tablet Take 1 tablet (10 mg total) by mouth daily. 90 tablet 3   polyethylene glycol (MIRALAX  / GLYCOLAX ) 17 g packet Take 17 g by mouth 2 (two) times daily. (Patient taking differently: Take 17 g by mouth daily as needed for mild constipation.) 60 each 1   No current facility-administered medications for this encounter.   Allergies  Allergen Reactions   Calcium  Channel Blockers Other (See Comments)    Came to hospital in 1995-caused chest pain    Social History   Socioeconomic History   Marital status: Widowed    Spouse name: Not on file   Number of children: Not on file   Years of education: Not on file   Highest  education level: Not on file  Occupational History   Not on file  Tobacco Use   Smoking status: Former    Current packs/day: 0.00    Types: Cigarettes    Quit date: 05/04/1986    Years since quitting: 37.4   Smokeless tobacco: Never   Tobacco comments:    Former smoker 02/12/22  Vaping Use   Vaping status: Never Used  Substance and Sexual Activity   Alcohol use: Not Currently   Drug use: No   Sexual activity: Yes  Other Topics Concern   Not on file  Social  History Narrative   Not on file   Social Drivers of Health   Financial Resource Strain: Not on file  Food Insecurity: No Food Insecurity (02/01/2022)   Hunger Vital Sign    Worried About Running Out of Food in the Last Year: Never true    Ran Out of Food in the Last Year: Never true  Transportation Needs: No Transportation Needs (02/01/2022)   PRAPARE - Administrator, Civil Service (Medical): No    Lack of Transportation (Non-Medical): No  Physical Activity: Not on file  Stress: Not on file  Social Connections: Not on file  Intimate Partner Violence: Not At Risk (02/01/2022)   Humiliation, Afraid, Rape, and Kick questionnaire    Fear of Current or Ex-Partner: No    Emotionally Abused: No    Physically Abused: No    Sexually Abused: No   Family History  Problem Relation Age of Onset   Heart failure Mother 36   Heart attack Father 58   BP (!) 95/56   Pulse 70   Wt 103.4 kg (228 lb)   SpO2 97%   BMI 35.71 kg/m   Wt Readings from Last 3 Encounters:  10/15/23 103.4 kg (228 lb)  04/29/23 111.6 kg (246 lb)  03/23/23 112.7 kg (248 lb 6.4 oz)   PHYSICAL EXAM: General:  Sitting in RW No resp difficulty HEENT: normal Neck: supple. no JVD. Carotids 2+ bilat; no bruits. No lymphadenopathy or thryomegaly appreciated. Cor: PMI nondisplaced. Regular rate & rhythm. No rubs, gallops or murmurs. Lungs: clear Abdomen: soft, nontender, nondistended. No hepatosplenomegaly. No bruits or masses. Good bowel  sounds. Extremities: no cyanosis, clubbing, rash, edema Neuro: alert & orientedx3, cranial nerves grossly intact. moves all 4 extremities w/o difficulty. Affect pleasant   ECG: AFL with v-pacing 70 Personally reviewed   St Jude interrogation (personally reviewed): 63% AF 97% biv pacing Fludi looks good. Personally reviewed   ASSESSMENT & PLAN:  1. Permanent atrial fibrillation/flutter  - Multiple previous admits with Afib and flutter - S/p TEE/DCCV to SR in 10/23. Had recurrent AFL and had overdrive pacing in ED 02/24/22  (saw Dr. Rodolfo Clan 3/24) - not felt to be candidate for PVI ablation or AV node ablation - CHA2DS2-VASc score is 6. - Failed attempts at rhythm control. Has chronic AFL - s/p AVN ablation 6/24 had CRT-P in place - Now off amio - PCP wanting to switch warfarin to Eliquis  now that he is off Phenobarb - Current Eliquis  dose 5 bid. (Drop to 2.5 bid in October when he turn 35) - Not interested in sleep study, wears 2L oxygen at night.  2. Chronic systolic CHF  - cardiomyopathy dates back to at least 2016.  - EF variable over the years. CM previously thought to be d/t LBBB +/- recurrent AF.  - Cath 5/21 minimal CAD - S/p CRT-P (9/21). - Echo (2021): EF < 20% . LHC with no significant CAD. - Echo (4/23): EF 40-45% - Echo (09/26/21): EF 30-35% in setting of AF - TEE (4/24): EF < 20% (in setting of AF RVR). - Echo (7/24): EF 35-40% - Stable NYHA II-III  Volume ok on torsemide  60/40 - ICD interrogated personally - Continue Jardiance  10 mg daily. No GU symptoms. - Continue Coreg  6.25 mg bid.   - Continue spironolactone  12.5 mg daily - Continue Entresto  24/26 mg bid. - BP too low to titrate GDMT - Will check labs today  3. CKD stage 3 - Baseline SCr ~2.3 - On SGLT2i. -  Follows with Nephrology at [facility] - will recheck labs today   4. H/o brain abscess - S/p craniotomy 2015 - Has been on phenobarbital  since 1995, no seizures.  5. COPD - Stable -  Continue home O2  6. Obesity - Body mass index is 35.71 kg/m. - Encouraged weight loss     Jules Oar, MD  3:17 PM

## 2023-10-15 NOTE — Patient Instructions (Signed)
 STOP Warfarin  START Eliquis  5 mg Twice daily on Tuesday.  Labs done today, your results will be available in MyChart, we will contact you for abnormal readings.  Your physician has requested that you have an echocardiogram. Echocardiography is a painless test that uses sound waves to create images of your heart. It provides your doctor with information about the size and shape of your heart and how well your heart's chambers and valves are working. This procedure takes approximately one hour. There are no restrictions for this procedure. Please do NOT wear cologne, perfume, aftershave, or lotions (deodorant is allowed). Please arrive 15 minutes prior to your appointment time.  Please note: We ask at that you not bring children with you during ultrasound (echo/ vascular) testing. Due to room size and safety concerns, children are not allowed in the ultrasound rooms during exams. Our front office staff cannot provide observation of children in our lobby area while testing is being conducted. An adult accompanying a patient to their appointment will only be allowed in the ultrasound room at the discretion of the ultrasound technician under special circumstances. We apologize for any inconvenience.  Your physician recommends that you schedule a follow-up appointment in: 9 months with an echocardiogram (March 2026) ** PLEASE CALL THE OFFICE IN DECEMBER TO ARRANGE YOUR FOLLOW UP APPOINTMENT.**  If you have any questions or concerns before your next appointment please send us  a message through [place] or call our office at [phone].    TO LEAVE A MESSAGE FOR THE NURSE SELECT OPTION 2, PLEASE LEAVE A MESSAGE INCLUDING: YOUR NAME DATE OF BIRTH CALL BACK NUMBER REASON FOR CALL**this is important as we prioritize the call backs  YOU WILL RECEIVE A CALL BACK THE SAME DAY AS LONG AS YOU CALL BEFORE 4:00 PM  At the [facility], you and your health needs are our priority. As part of  our continuing mission to provide you with exceptional heart care, we have created designated Provider Care Teams. These Care Teams include your primary Cardiologist (physician) and Advanced Practice Providers (APPs- Physician Assistants and Nurse Practitioners) who all work together to provide you with the care you need, when you need it.   You may see any of the following providers on your designated Care Team at your next follow up: Dr Jules Oar Dr Peder Bourdon Dr. Alwin Baars Dr. Arta Lark Amy Marijane Shoulders, NP Ruddy Corral, [name] [facility], [name] Dennise Fitz, NP [place] Lee, NP Shawnee Dellen, NP Luster Salters, PharmD Bevely Brush, PharmD   Please be sure to bring in all your medications bottles to every appointment.    Thank you for choosing [facility]

## 2023-10-15 NOTE — Addendum Note (Signed)
 Encounter addended by: Luiz Sakai, RN on: 10/15/2023 3:41 PM  Actions taken: Diagnosis association updated, Medication long-term status modified, Pharmacy for encounter modified, Order list changed, Clinical Note Signed, Charge Capture section accepted

## 2023-10-17 NOTE — Progress Notes (Signed)
 Cardiology Office Note Date:  10/17/2023  Patient ID:  Ricky, Hayden 20-Apr-1946, MRN [id number] PCP:  [facility]  Cardiologist:  [facility] ? AHF: Dr. Cherrie EP: Dr. Fernande >> Dr. Fernande    Chief Complaint:   device visit  History of Present Illness: Ricky Hayden is a 78 y.o. 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  Patient initially required BiPAP for shortness of breath but was eventually weaned off.  Patient was found to have a fever and his urinalysis showed features concerning for UTI.  Patient was found to have urosepsis and was started on IV antibiotics.  CT abdomen showed a distended bladder with nonobstructive renal stones and an exophytic lesion on left kidney.  Patient's blood cultures returned positive for Proteus, and IV antibiotics were broadened to Cefazolin  for 14 days.  On 5/2, patient had a decrease in level of consciousness and increasing oxygen requirement.  Patient required intubation and had a brief PEA arrest after intubation.  Chest x-ray showed a right infiltrate concerning for an aspiration event with H CAP. Patient developed hypotension, septic shock and was treated with fluid boluses and pressors.  Patient had a decrease in U OP and concerns of volume overload.  Patient's acute on chronic renal failure was felt to be due to acute tubular necrosis secondary to severe  UTI/septic/shock.  Patient had an EEG that was negative for seizure.  CT head was negative for acute changes.  Patient was extubated on 5/12.  Eventually, patient's AKI resolved and his renal function returned to baseline.  Delirium has been continuing to resolve but patient continued to have a delay in processing, decreased awareness of deficits, fatigue, lysed generalized weakness, and poor balance.  Due to his functional decline, patient was transferred to the physical medicine and rehabilitation service and has been working with PT/OT. on 5/24, a rapid response was called on this patient because patient had a low BP of 79/57.  On assessment, patient was alert, able to answer questions, denied chest pain, shortness of breath.  Heart rate was found to be 131 bpm.  Patient was given 1 L normal saline with improvement in BP. EKG showed a regular, wide complex tachycardia with HR 130 (known LBBB present)  Suspect 2:1 AFlutter, amiodarone  increased, 09/26/21, DCCV was successful.  apixaban  which was changed due to lovenox  as phenobarbital  decreases effectiveness of apixaban , edoxaban also has drug drug interaction with phenobarbital , decision is made to continue anticoagulation with coumadin   Discharged back to rehab , cardiology s/o 10/02/21   Readmitted 10/27/2021 due to abdominal pain and flank pain and was diagnosed with pyelonephritis. Back in Afib/flutter/RVR cardiology consulted noted PAFib/flutter on telemetry, coreg  was increased. Recommended resuming GDMT when renal function allowed otherwise Discharged 11/03/21 Jardiance  stopped with recurrent UTIs  Seeing cardiology team, Dr. Shearon clinic as well   Admitted 01/31/22, acute/chronic CHF, RVR, despite IV diuretic, sluggish urine OP.  TEE/DCC: On Rx  coumadin  NR 2.4 Anesthesia: Propofol  Patient with rapid afib 122 bpm Sats 88% with propofol  and required 100ug/min Neo to support BP No LAA thrombus Mild MR Moderate TR Severe RV dysfunction   Severe LV dysfunction 20% DCC x 1 200J converted from Afib to NSR with BiV pacing rate 84 bpm Sats improved as he awoke Neo weaned over 15 minutes.  Have asked CHF team to see and transfer to 2 H for more intense Rx    HF team assisted with management with BIVENTRICULAR failure. Discharged 02/05/22 (perhaps at patient insistence)  Saw HF and AF clinic 02/12/22, doing OK, DOE with ambulation, wearing O2, again, wanted off warfarin 2/2 INR burden.  Not ready for BB yet. Discussed phenobarbital  issue and that he has been on it for many years w/hx of brain abscess/seizures.  Deferred to neurology Maintaining SR, amiodarone  dose reduced to maintenance dosing 200mg  daily Discussed amio washout for Tikosyn AVN ode ablation vs AFib ablation  candidacy.  Deferred to Dr. Fernande  ER visit 02/24/22, referred by office with reports of tachycardia, 120's-130's with low BPs at home,  He was seen by myself and Dr. Inocencio when we were bdeside in CVR AFlutter on telemetry, seen by his device episode ongoing 7 hours and pace terminated in the ER and planned to discharge on amiodarone  200mg  BID if labs/no other reason to admit was found.  Following with Dr Fernande, HF team, only ONE hospitalization in 2024 ER 08/07/22 with CP, palpitations > WCT 130's felt to be aFib/flutter >> DCCV > diuresed, discharged 08/09/22  Ultimately AVNode ablated  Most recently he saw Dr. Waddell 04/29/23, doing well post AV node ablation with paced QRS [place], though sedentary.  Seems there may have been plans to wean off phenobarbital  and switch to Elquis  He saw Dr. Cherrie 10/15/23, ambulating with a rollator using O2 at HS, had ost 20lbs, doing pretty well 97% BP Was off phenobarbital  and wafarin changed to eliquis  Not enough BP to further titrate meds  TODAY  He is accompanied by his niece (she lives locally), he now resides on [address], prefers to keep his MDs/medical care here, no  regular providers locally for him  He is very happy feels well since his AV node ablation  Denies any CP, palpitations or cardiac awareness No SOB No near syncope or syncope  His last dose of warfarin was 10/14/23 (6/13 INR was 2.3) 1st dose of Eliquis  this morning (17th)  Device information [employer] CRT-P implanted 01/17/2020 Left bundle lead in LV port RV lead is programmed subthreshold   AAD hx Amiodarone  started 04/2021, afib s/p AV node ablation 10/14/22 and off amio  Past Medical History:  Diagnosis Date   Arthritis    osteoarthritis of left knee   Ascending aorta dilatation (HCC)    Balanitis    recurrent   Cardiac arrhythmia    life threatening, secondary to CCB vs b- blockers   Cardiomyopathy (HCC)    Chronic joint pain    Chronic systolic CHF (congestive heart failure) (HCC)    CKD (chronic kidney disease), stage III (HCC)    COPD (chronic obstructive pulmonary disease) (HCC)    Coronary artery disease    Dilated aortic root (HCC)    Dyspnea    Enlarged prostate    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 Alix   Nocturia    Obesity    Pansinusitis    a.  complicated by brain abscess and bleeding requiring craniotomy in 1995.   PONV (postoperative nausea and vomiting)    Presence of permanent cardiac pacemaker    Sinusitis    s/p ethmoidectomy and nasal septoplasty   Vertigo    intermitantly    Past Surgical History:  Procedure Laterality Date   AV NODE ABLATION N/A 10/14/2022   Procedure: AV NODE ABLATION;  Surgeon: Waddell Danelle ORN, MD;  Location: [facility] INVASIVE CV LAB;  Service: Cardiovascular;  Laterality: N/A;   BIV UPGRADE N/A 01/29/2020   Procedure: BIV UPGRADE;  Surgeon: Waddell Danelle ORN, MD;  Location: [facility] INVASIVE CV LAB;  Service:  Cardiovascular;  Laterality: N/A;   CARDIOVERSION N/A 09/26/2021   Procedure: CARDIOVERSION;  Surgeon: Raford Riggs, MD;  Location: [facility] ENDOSCOPY;  Service: Cardiovascular;  Laterality: N/A;   CARDIOVERSION N/A 02/03/2022   Procedure: CARDIOVERSION;  Surgeon: Delford Maude BROCKS, MD;  Location: [facility] ENDOSCOPY;  Service: Cardiovascular;  Laterality: N/A;   CARDIOVERSION N/A 08/07/2022   Procedure: CARDIOVERSION;  Surgeon: Rolan Ezra RAMAN, MD;  Location: [facility] ENDOSCOPY;  Service: Cardiovascular;  Laterality: N/A;   CIRCUMCISION N/A 11/20/2013   Procedure: CIRCUMCISION ADULT;  Surgeon: Oneil BROCKS Rafter, MD;  Location: [facility] ORS;  Service: Urology;  Laterality: N/A;   CRANIOTOMY  1995   hematomy due to sinus infection    CYSTOSCOPY/URETEROSCOPY/HOLMIUM LASER/STENT PLACEMENT Left 12/23/2021   Procedure: LEFT URETEROSCOPY/HOLMIUM LASER LITHOTRIPSY/STENT PLACEMENT retrograde;  Surgeon: Lovie Arlyss CROME, MD;  Location: [facility] ORS;  Service: Urology;  Laterality: Left;  90 MINUTES NEEDED FOR CASE   PACEMAKER IMPLANT N/A 01/17/2020   Procedure: PACEMAKER IMPLANT;  Surgeon: Fernande Elspeth BROCKS, MD;  Location: [facility] INVASIVE CV LAB;  Service: Cardiovascular;  Laterality: N/A;   RIGHT/LEFT HEART CATH AND CORONARY ANGIOGRAPHY N/A 09/20/2019   Procedure: RIGHT/LEFT HEART CATH AND CORONARY ANGIOGRAPHY;  Surgeon: Cherrie Toribio SAUNDERS, MD;  Location: [facility] INVASIVE CV LAB;  Service: Cardiovascular;  Laterality: N/A;   shoulder surg rt   1995   SINUS SURGERY WITH INSTATRAK     ethmoidectomy and nasal septum repair   TEE WITHOUT CARDIOVERSION N/A 09/26/2021   Procedure: TRANSESOPHAGEAL ECHOCARDIOGRAM (TEE);  Surgeon: Raford Riggs, MD;  Location: [facility] ENDOSCOPY;  Service: Cardiovascular;  Laterality: N/A;   TEE WITHOUT CARDIOVERSION N/A 02/03/2022   Procedure: TRANSESOPHAGEAL ECHOCARDIOGRAM (TEE);  Surgeon: Delford Maude BROCKS, MD;  Location: [facility] ENDOSCOPY;  Service: Cardiovascular;  Laterality: N/A;   TEE WITHOUT CARDIOVERSION N/A 08/07/2022    Procedure: TRANSESOPHAGEAL ECHOCARDIOGRAM (TEE);  Surgeon: Rolan Ezra RAMAN, MD;  Location: [facility] ENDOSCOPY;  Service: Cardiovascular;  Laterality: N/A;    Current Outpatient Medications  Medication Sig Dispense Refill   acetaminophen  (TYLENOL ) 500 MG tablet Take 1,000 mg by mouth every 6 (six) hours as needed for moderate pain.     albuterol  (PROVENTIL  HFA;VENTOLIN  HFA) 108 (90 BASE) MCG/ACT inhaler Inhale 2 puffs into the lungs every 6 (six) hours as needed for wheezing. 1 Inhaler 11   apixaban  (ELIQUIS ) 5 MG TABS tablet Take 1 tablet (5 mg total) by mouth 2 (two) times daily. 60 tablet 11   carvedilol  (COREG ) 6.25 MG tablet Take 1 tablet (6.25 mg total) by mouth 2 (two) times daily. 180 tablet 3   empagliflozin  (JARDIANCE ) 10 MG TABS  tablet Take 1 tablet (10 mg total) by mouth daily. 90 tablet 3   feeding supplement (ENSURE ENLIVE / ENSURE PLUS) LIQD Take 237 mLs by mouth 2 (two) times daily between meals. 10000 mL 0   finasteride  (PROSCAR ) 5 MG tablet TAKE 1/2 TABLET BY MOUTH DAILY 30 tablet 0   fluticasone -salmeterol (WIXELA INHUB) 250-50 MCG/ACT AEPB Inhale 1 puff into the lungs in the morning and at bedtime.     Multiple Vitamins-Minerals (PRESERVISION AREDS 2 PO) Take 1 tablet by mouth in the morning and at bedtime.     pantoprazole  (PROTONIX ) 40 MG tablet Take 1 tablet (40 mg total) by mouth daily. 30 tablet 0   polyethylene glycol (MIRALAX  / GLYCOLAX ) 17 g packet Take 17 g by mouth 2 (two) times daily. (Patient taking differently: Take 17 g by mouth daily as needed for mild constipation.) 60 each 1   pravastatin  (PRAVACHOL ) 40 MG tablet TAKE 1 TABLET BY MOUTH EVERY DAY IN THE EVENING 90 tablet 0   sacubitril -valsartan  (ENTRESTO ) 24-26 MG Take 1 tablet by mouth 2 (two) times daily. Start 08/10/22 180 tablet 3   spironolactone  (ALDACTONE ) 25 MG tablet Take 0.5 tablets (12.5 mg total) by mouth daily. 45 tablet 3   Tiotropium Bromide  Monohydrate (SPIRIVA  RESPIMAT) 2.5 MCG/ACT AERS Inhale 2 each  into the lungs every evening.     torsemide  (DEMADEX ) 20 MG tablet Take 40-80 mg by mouth See admin instructions. Take 80 mg in the morning and 40 mg in the evening     Vitamin D3 (VITAMIN D ) 25 MCG tablet Take 1 tablet (1,000 Units total) by mouth daily. 60 tablet 0   No current facility-administered medications for this visit.    Allergies:   Calcium  channel blockers   Social History:  The patient  reports that he quit smoking about 37 years ago. His smoking use included cigarettes. He has never used smokeless tobacco. He reports that he does not currently use alcohol. He reports that he does not use drugs.   Family History:  The patient's family history includes Heart attack (age of onset: 31) in his father; Heart failure (age of onset: 24) in his mother.  ROS:  Please see the history of present illness.  All other systems are reviewed and otherwise negative.   PHYSICAL EXAM:  VS:  There were no vitals taken for this visit. BMI: There is no height or weight on file to calculate BMI.  A recheck on fhi sBP by myself 90/62 Well nourished, well developed, in no acute distress  HEENT: normocephalic, atraumatic  Neck: no JVD, carotid bruits or masses Cardiac:  RRR (paced); no significant murmurs, no rubs, or gallops Lungs: CTA b/l, b/l end exp wheezes, no rhonchi or rales  Abd: soft, nontender MS: no deformity or atrophy Ext: trace if any edema  Skin: warm and dry, no rash Neuro:  No gross deficits appreciated Psych: euthymic mood, full affect  ICD site: stable, skin is intact, no tethering, fluctuation of discomfort   EKG:  done 10/15/23, personally reviewed AFlutter, V paced ( QRS)  Device interrogation done today and reviewed by myself Battery and lead measurements are stable RV lead is programmed subthreshold chronically BP 97 %  He is in AFlutter BP rate This episode started 6/13 is ongoing Looks like he was initially in/out > though current episode now is onging for  >4 days    11/12/22: TTE 1. Poor acoustical windows precludes accurate estimate of LVF or wall  motion due to  inability to adequately visualize endocardium. Left  ventricular endocardial border not optimally defined to evaluate regional  wall motion. Left ventricular diastolic  function could not be evaluated.   2. Right ventricular systolic function was not well visualized. The right  ventricular size is mildly enlarged. Tricuspid regurgitation signal is  inadequate for assessing PA pressure.   3. The mitral valve is normal in structure. Trivial mitral valve  regurgitation. No evidence of mitral stenosis.   4. The aortic valve is tricuspid. Aortic valve regurgitation is trivial.  Aortic valve sclerosis/calcification is present, without any evidence of  aortic stenosis.   5. The inferior vena cava is normal in size with greater than 50%  respiratory variability, suggesting right atrial pressure of 3 mmHg.   6. Aortic dilatation noted. There is mild dilatation of the aortic root,  measuring 43 mm.   7. Ascending aorta measurements are within normal limits for age when  indexed to body surface area.   8. Recommend repeat limited echo with definity  contrast to assess LVF.    02/03/22: TEE  1. CHF service contacted to see patient and transferred to [facility]  updated.   2. No LAA thrombus found DCC x 1 200 J converted from rapid fib.flutter  rate 122 to NSR with BiV pacing rate 84 bpm.   3. Patient dropped his sats mid 80's and BP 88 mmHg with propofol   Improved post procedure and with 100 ug/min neo Study was performed  expidesiously.   4. Left ventricular ejection fraction, by estimation, is 20 to 25%. The  left ventricle has normal function. The left ventricular internal cavity  size was severely dilated. Left ventricular diastolic function could not  be evaluated.   5. Bi V device leads in RA/RV. Right ventricular systolic function is  severely reduced. The right ventricular  size is severely enlarged.   6. Left atrial size was severely dilated. No left atrial/left atrial  appendage thrombus was detected.   7. Right atrial size was severely dilated.   8. The mitral valve is normal in structure. Mild mitral valve  regurgitation.   9. Tricuspid valve regurgitation is moderate.  10. The aortic valve is tricuspid. Aortic valve regurgitation is not  visualized. No aortic stenosis is present.      04/03/21: limited echo Findings:          Pericardium:  No significant pericardial effusion          LV function:  Severely depressed (< 30% EF)          RV:  Normal          IVC collapsibility:  Indeterminate    May 2021: R/LHC Prox LAD to Mid LAD lesion is 25% stenosed. Prox Cx to Dist Cx lesion is 25% stenosed.  Findings:  Ao = 98/69 (81) LV = 101/24 RA = 11 RV = 59/15 PA = 64/23 (40) PCW = 19 Fick cardiac output/index = 5.6/2.6 PVR = 3.5 WU  FA sat = 94% PA sat = 64%, 70%  Assessment:  1. Minimal CAD 2. Nonischemic CM 3. Mild to moderately elevated filling pressures with normal cardiac output   2D Echo 4/21 1. Left ventricular ejection fraction, by estimation, is <20%. The left ventricle has severely decreased function. The left ventricle demonstrates global hypokinesis. The left ventricular internal cavity size was mildly dilated. There is mild concentric left ventricular hypertrophy. Left ventricular diastolic function could not be evaluated. Elevated left ventricular enddiastolic pressure. 2. Right ventricular systolic function  is severely reduced. The right ventricular size is severely enlarged. There is moderately elevated pulmonary artery systolic pressure. 3. The mitral valve is normal in structure. Mild mitral valve regurgitation. No evidence of mitral stenosis. 4. The aortic valve is tricuspid. Aortic valve regurgitation is trivial. Mild to moderate aortic valve sclerosis/calcification is present, without any evidence of aortic  stenosis. 5. Aortic dilatation noted. There is mild dilatation of the aortic root and of the ascending aorta measuring 41 mm and 45mm respectively. 6. The inferior vena cava is dilated in size with <50% respiratory variability, suggesting right atrial pressure of 15 mmHg. 7. Left atrial size was severely dilated. 8. Right atrial size was mildly dilated.   Recent Labs: 10/15/2023: B Natriuretic Peptide 40.6; BUN 43; Creatinine, Ser 2.10; Potassium 4.7; Sodium 142  No results found for requested labs within last 365 days.   Estimated Creatinine Clearance: 32.7 mL/min (A) (by C-G formula based on SCr of 2.1 mg/dL (H)).   Wt Readings from Last 3 Encounters:  10/15/23 228 lb (103.4 kg)  04/29/23 246 lb (111.6 kg)  03/23/23 248 lb 6.4 oz (112.7 kg)     Other studies reviewed: Additional studies/records reviewed today include: summarized above  ASSESSMENT AND PLAN:  1. CRT-P Intact function No programming changes made Left bundle lead in LV port RV lead is programmed subthreshold(off)  2. NICM 3. Chronic CHF     BiVe failure     meds with HF team     CorVue looks good     No symptoms or exam findings of volume OL            4. Afib/flutter CHA2DS2Vasc is 4, on eliquis , appropriately dosed S/p AV node ablation INR was 2.3 on 10/15/23 > last dose of warfarin was 10/14/23 TODAY this morning was his 1st dose of Eliquis . Given potential period of subtherpeutic INR and no OAC and duration of his AFlutter, was not comfortable trying to pace terminate his flutter today  We discussed options> TEE/DCCV after 3 days or Eliquis  Or DCCV after 3 weeks of uninterrupted Eliquis   Neither or great options for him, he does not want to stay in town any longer He is rate controlled and feels well His remotes are going to then [place] I have asked the device clinic to request remotes (patient preference) He spent ~ 6 mo in AF with hemodynamic stability and no awareness in the last year, though in  the last few months has been paroxysmal We landed on that I will ask the device clinic to check a remote transmission in a couple weeks to re-evaluate his rhythm. He though [place] or [place] Will monitor for now with no plans to pursue rhythm control/DCCV  Disposition: back in 3-92mo, sooner if needed  Current medicines are reviewed at length with the patient today.  The patient did not have any concerns regarding medicines.  Bonney [name], PA-C 10/17/2023 5:14 PM     [facility] [address] [place] [address] [phone] (office)  [phone] (fax)

## 2023-10-18 ENCOUNTER — Ambulatory Visit (HOSPITAL_COMMUNITY): Payer: Self-pay | Admitting: Adult Health

## 2023-10-19 ENCOUNTER — Ambulatory Visit: Attending: Physician Assistant | Admitting: Physician Assistant

## 2023-10-19 VITALS — BP 94/66 | HR 70 | Ht 68.0 in | Wt 228.0 lb

## 2023-10-19 DIAGNOSIS — I4892 Unspecified atrial flutter: Secondary | ICD-10-CM

## 2023-10-19 DIAGNOSIS — I428 Other cardiomyopathies: Secondary | ICD-10-CM

## 2023-10-19 DIAGNOSIS — I4819 Other persistent atrial fibrillation: Secondary | ICD-10-CM | POA: Diagnosis not present

## 2023-10-19 DIAGNOSIS — Z950 Presence of cardiac pacemaker: Secondary | ICD-10-CM | POA: Diagnosis not present

## 2023-10-19 NOTE — Patient Instructions (Addendum)
 Medication Instructions:   Your physician recommends that you continue on your current medications as directed. Please refer to the Current Medication list given to you today.    *If you need a refill on your cardiac medications before your next appointment, please call your pharmacy*    Lab Work: NONE ORDERED  TODAY     If you have labs (blood work) drawn today and your tests are completely normal, you will receive your results only by: MyChart Message (if you have MyChart) OR A paper copy in the mail If you have any lab test that is abnormal or we need to change your treatment, we will call you to review the results.    Testing/Procedures: NONE ORDERED  TODAY    Follow-Up: At [facility], you and your health needs are our priority.  As part of our continuing mission to provide you with exceptional heart care, our providers are all part of one team.  This team includes your primary Cardiologist (physician) and Advanced Practice Providers or APPs (Physician Assistants and Nurse Practitioners) who all work together to provide you with the care you need, when you need it.  Your next appointment:    6 month(s)   Provider:    You may see Mertha Abrahams, PA-C      We recommend signing up for the patient portal called MyChart.  Sign up information is provided on this After Visit Summary.  MyChart is used to connect with patients for Virtual Visits (Telemedicine).  Patients are able to view lab/test results, encounter notes, upcoming appointments, etc.  Non-urgent messages can be sent to your provider as well.   To learn more about what you can do with MyChart, go to [email or web address].

## 2023-10-20 ENCOUNTER — Other Ambulatory Visit: Payer: Self-pay | Admitting: *Deleted

## 2023-10-29 ENCOUNTER — Ambulatory Visit (INDEPENDENT_AMBULATORY_CARE_PROVIDER_SITE_OTHER)

## 2023-10-29 ENCOUNTER — Telehealth: Payer: Self-pay | Admitting: Physician Assistant

## 2023-10-29 ENCOUNTER — Telehealth: Payer: Self-pay

## 2023-10-29 DIAGNOSIS — I428 Other cardiomyopathies: Secondary | ICD-10-CM | POA: Diagnosis not present

## 2023-10-29 NOTE — Telephone Encounter (Signed)
 I have let niece know that pt is now in our system. Niece is concerned that pt is still in afib because he is not feeling well and would like to know what to do. Pt will send in a transmission and would like a call back

## 2023-10-29 NOTE — Telephone Encounter (Signed)
 The pt is now in our system. I have put him on a remote schedule.

## 2023-10-29 NOTE — Telephone Encounter (Signed)
 New Message:    Niece is calling to find out if you have started receiving this patient's device transmission. He was having it did through  the [place]. It was supposed to have been changed to come here.

## 2023-10-29 NOTE — Telephone Encounter (Signed)
-----   Message from Nurse Almarie ORN sent at 10/21/2023 12:07 PM EDT ----- After patient is into our monitoring system, could someone schedule a remote check to recheck if patient is in AFlutter per [place]. Please see her note below.   Thank you, Anette, RN ----- Message ----- From: Leverne Charlies Helling, PA-C Sent: 10/19/2023   5:07 PM EDT To: [facility]  Can you guys polease follow up on getting his transmissions transferred to us  from the [place] Would like to know in the next few weeks if he is in[place]oit of AFlutter  Thanks!  renee

## 2023-10-29 NOTE — Telephone Encounter (Signed)
 Routing to [place] considering recent OV 10/19/23.

## 2023-11-01 LAB — CUP PACEART REMOTE DEVICE CHECK
Battery Remaining Longevity: 64 mo
Battery Remaining Percentage: 54 %
Battery Voltage: 2.99 V
Brady Statistic AP VP Percent: 0 %
Brady Statistic AP VS Percent: 0 %
Brady Statistic AS VP Percent: 0 %
Brady Statistic AS VS Percent: 0 %
Brady Statistic RA Percent Paced: 0 %
Date Time Interrogation Session: 20250627160112
Implantable Lead Connection Status: 753985
Implantable Lead Connection Status: 753985
Implantable Lead Connection Status: 753985
Implantable Lead Implant Date: 20210915
Implantable Lead Implant Date: 20210915
Implantable Lead Implant Date: 20210927
Implantable Lead Location: 753858
Implantable Lead Location: 753859
Implantable Lead Location: 753860
Implantable Lead Model: 3830
Implantable Pulse Generator Implant Date: 20210915
Lead Channel Impedance Value: 450 Ohm
Lead Channel Impedance Value: 530 Ohm
Lead Channel Impedance Value: 580 Ohm
Lead Channel Pacing Threshold Amplitude: 0.5 V
Lead Channel Pacing Threshold Amplitude: 0.75 V
Lead Channel Pacing Threshold Amplitude: 0.75 V
Lead Channel Pacing Threshold Pulse Width: 0.4 ms
Lead Channel Pacing Threshold Pulse Width: 0.4 ms
Lead Channel Pacing Threshold Pulse Width: 0.5 ms
Lead Channel Sensing Intrinsic Amplitude: 12 mV
Lead Channel Sensing Intrinsic Amplitude: 2.7 mV
Lead Channel Setting Pacing Amplitude: 0.25 V
Lead Channel Setting Pacing Amplitude: 2 V
Lead Channel Setting Pacing Amplitude: 2 V
Lead Channel Setting Pacing Pulse Width: 0.05 ms
Lead Channel Setting Pacing Pulse Width: 0.4 ms
Lead Channel Setting Sensing Sensitivity: 2 mV
Pulse Gen Model: 3222
Pulse Gen Serial Number: 9188373

## 2023-11-01 NOTE — Telephone Encounter (Signed)
 Left message requesting  call back.

## 2023-11-08 NOTE — Telephone Encounter (Signed)
 Reviewed the patient's chart- no call back and he has not reviewed his MyChart message sent on 11/03/23.   Attempted to call his primary contact # on file- [phone]. This # is for his daughter, Ricky Hayden. Advised we were trying to contact the patient in regards to his transmission from 10/29/23.  Per Ricky Hayden, the patient is living with her sister, Ricky. She advised that I contact Ricky Hayden at [phone].  I called and spoke with the patient's daughter, Ricky.  The patient is currently with her.  I advised we received his transmission from 10/29/23 showing he has been in A-fib since ~ 10/19/23. Per Ricky that patient has been reporting a little more fatigue than usual, but she was unsure if he was out of rhythm due to the fact that he was not having consistently elevated heart rates like he normally does when he is out of rhythm. Denies increased SOB. Some left lower extremity edema.   Weights have been fluctuating 2-3 lbs, but never over 3 lbs. Corvue currently stable.  BP's have been 95/63 & 96/61 recently, but today was normal per Ricky.   Meds confirmed: Eliquis  5 mg BID (missed 1 dose last night) Coreg  6.25 mg BID Torsemide  20 mg (has been changed to due to renal function per Ricky) 60 mg qAM & 40 mg qPM  I requested a follow up transmission be sent today. Transmission received.  Ricky has been advised the patient is still in A-fib at this time.   The patient is currently living in [place] with [name], but he has no doctors in that area. He still sees the [facility], advised if he switched [facility] they would assign him to another cardiologist and they did not want to do that.   Ricky advised they will bring the patient to an appointment in [place] as needed with some notice.   [name] Ricky I will forward to [name], [name] for further advisement and will reach back out once recommendations are received.  Ricky voices understanding and is agreeable.   Ricky aware of ER precautions if symptoms worsen.

## 2023-11-09 NOTE — Telephone Encounter (Signed)
 Called and spoke with the patient's daughter, Ricky Hayden. Advised her of Ricky Hayden, Ricky Hayden recommendations.  Ricky Hayden is aware of the need for potential DCCV in 3 weeks with uninterrupted anticoagulation.  Ricky Hayden that based on Ricky Hayden's last office note, it may be worth seeing Ricky Hayden in the clinic in ~ 3 weeks to discuss a further plan of care at this time.  Ricky Hayden is agreeable with the patient seeing Ricky Hayden on 11/30/22 at 3:45 pm in the [place] office.  They will call back in the interim with any further concerns.   Ricky Hayden was appreciative of the call back today.

## 2023-11-30 ENCOUNTER — Ambulatory Visit: Attending: Internal Medicine | Admitting: Internal Medicine

## 2023-11-30 VITALS — BP 112/64 | Ht 68.0 in | Wt 225.0 lb

## 2023-11-30 DIAGNOSIS — I4821 Permanent atrial fibrillation: Secondary | ICD-10-CM | POA: Diagnosis not present

## 2023-11-30 NOTE — Patient Instructions (Signed)
 Medication Instructions:  Your physician recommends that you continue on your current medications as directed. Please refer to the Current Medication list given to you today.  *If you need a refill on your cardiac medications before your next appointment, please call your pharmacy*  Follow-Up: At [facility], you and your health needs are our priority.  As part of our continuing mission to provide you with exceptional heart care, our providers are all part of one team.  This team includes your primary Cardiologist (physician) and Advanced Practice Providers or APPs (Physician Assistants and Nurse Practitioners) who all work together to provide you with the care you need, when you need it.  Your next appointment:   1 year(s)  Provider:   Danelle Birmingham, MD   We recommend signing up for the patient portal called MyChart.  Sign up information is provided on this After Visit Summary.  MyChart is used to connect with patients for Virtual Visits (Telemedicine).  Patients are able to view lab/test results, encounter notes, upcoming appointments, etc.  Non-urgent messages can be sent to your provider as well.    To learn more about what you can do with MyChart, go to [email or web address].

## 2023-11-30 NOTE — Progress Notes (Signed)
 HPI Ricky Hayden returns for followup of AV node ablation. The patient is a pleasant 78 yo man with a h/o chronic systolic heart failure, LBBB, he has been placed on amiodarone . He has had recurrent atrial fib with a RVR. He underwent AV node ablation. His pace QRS is 100 ms. He has been stable. His amio has been stopped as he did not maintain NSR. He was felt not to be a candidate for PVI by Dr. FELICIANA.  Allergies  Allergen Reactions   Calcium  Channel Blockers Other (See Comments)    Came to hospital in 1995-caused chest pain      Current Outpatient Medications  Medication Sig Dispense Refill   acetaminophen  (TYLENOL ) 500 MG tablet Take 1,000 mg by mouth every 6 (six) hours as needed for moderate pain.     albuterol  (PROVENTIL  HFA;VENTOLIN  HFA) 108 (90 BASE) MCG/ACT inhaler Inhale 2 puffs into the lungs every 6 (six) hours as needed for wheezing. 1 Inhaler 11   apixaban  (ELIQUIS ) 5 MG TABS tablet Take 1 tablet (5 mg total) by mouth 2 (two) times daily. 60 tablet 11   carvedilol  (COREG ) 6.25 MG tablet Take 1 tablet (6.25 mg total) by mouth 2 (two) times daily. 180 tablet 3   empagliflozin  (JARDIANCE ) 10 MG TABS tablet Take 1 tablet (10 mg total) by mouth daily. 90 tablet 3   feeding supplement (ENSURE ENLIVE / ENSURE PLUS) LIQD Take 237 mLs by mouth 2 (two) times daily between meals. 10000 mL 0   finasteride  (PROSCAR ) 5 MG tablet TAKE 1/2 TABLET BY MOUTH DAILY 30 tablet 0   fluticasone -salmeterol (WIXELA INHUB) 250-50 MCG/ACT AEPB Inhale 1 puff into the lungs in the morning and at bedtime.     Multiple Vitamins-Minerals (PRESERVISION AREDS 2 PO) Take 1 tablet by mouth in the morning and at bedtime.     pantoprazole  (PROTONIX ) 40 MG tablet Take 1 tablet (40 mg total) by mouth daily. 30 tablet 0   polyethylene glycol (MIRALAX  / GLYCOLAX ) 17 g packet Take 17 g by mouth 2 (two) times daily. (Patient taking differently: Take 17 g by mouth daily as needed for mild constipation.) 60 each 1    pravastatin  (PRAVACHOL ) 40 MG tablet TAKE 1 TABLET BY MOUTH EVERY DAY IN THE EVENING 90 tablet 0   sacubitril -valsartan  (ENTRESTO ) 24-26 MG Take 1 tablet by mouth 2 (two) times daily. Start 08/10/22 180 tablet 3   spironolactone  (ALDACTONE ) 25 MG tablet Take 0.5 tablets (12.5 mg total) by mouth daily. 45 tablet 3   Tiotropium Bromide  Monohydrate (SPIRIVA  RESPIMAT) 2.5 MCG/ACT AERS Inhale 2 each into the lungs every evening.     torsemide  (DEMADEX ) 20 MG tablet Take 40-80 mg by mouth See admin instructions. Take 80 mg in the morning and 40 mg in the evening     Vitamin D3 (VITAMIN D ) 25 MCG tablet Take 1 tablet (1,000 Units total) by mouth daily. 60 tablet 0   No current facility-administered medications for this visit.     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 Alix   Nocturia    Obesity    Pansinusitis    a.  complicated by brain abscess and bleeding requiring craniotomy in 1995.   PONV (postoperative nausea and vomiting)    Presence of permanent cardiac pacemaker    Sinusitis    s/p ethmoidectomy and nasal septoplasty   Vertigo    intermitantly    ROS:   All systems reviewed and negative except as noted in the HPI.   Past Surgical History:   Procedure Laterality Date   AV NODE ABLATION N/A 10/14/2022   Procedure: AV NODE ABLATION;  Surgeon: Waddell Danelle ORN, MD;  Location: [facility] INVASIVE CV LAB;  Service: Cardiovascular;  Laterality: N/A;   BIV UPGRADE N/A 01/29/2020   Procedure: BIV UPGRADE;  Surgeon: Waddell Danelle ORN, MD;  Location: [facility] INVASIVE CV LAB;  Service: Cardiovascular;  Laterality: N/A;   CARDIOVERSION N/A 09/26/2021   Procedure: CARDIOVERSION;  Surgeon: Raford Riggs, MD;  Location: [facility] ENDOSCOPY;  Service: Cardiovascular;  Laterality: N/A;   CARDIOVERSION N/A 02/03/2022   Procedure: CARDIOVERSION;  Surgeon: Delford Maude BROCKS, MD;  Location: [facility] ENDOSCOPY;  Service: Cardiovascular;  Laterality: N/A;   CARDIOVERSION N/A 08/07/2022   Procedure: CARDIOVERSION;  Surgeon: Rolan Ezra RAMAN, MD;  Location: [facility] ENDOSCOPY;  Service: Cardiovascular;  Laterality: N/A;   CIRCUMCISION N/A 11/20/2013   Procedure: CIRCUMCISION ADULT;  Surgeon: Oneil BROCKS Rafter, MD;  Location: [facility] ORS;  Service: Urology;  Laterality: N/A;   CRANIOTOMY  1995   hematomy due to sinus infection    CYSTOSCOPY/URETEROSCOPY/HOLMIUM LASER/STENT PLACEMENT Left 12/23/2021   Procedure: LEFT URETEROSCOPY/HOLMIUM LASER LITHOTRIPSY/STENT PLACEMENT retrograde;  Surgeon: Lovie Arlyss CROME, MD;  Location: [facility] ORS;  Service: Urology;  Laterality: Left;  90 MINUTES NEEDED FOR CASE   PACEMAKER IMPLANT N/A 01/17/2020   Procedure: PACEMAKER IMPLANT;  Surgeon: Fernande Elspeth BROCKS, MD;  Location: [facility] INVASIVE CV LAB;  Service: Cardiovascular;  Laterality: N/A;   RIGHT/LEFT HEART CATH AND CORONARY ANGIOGRAPHY N/A 09/20/2019   Procedure: RIGHT/LEFT HEART CATH AND CORONARY ANGIOGRAPHY;  Surgeon: Cherrie Toribio SAUNDERS, MD;  Location: [facility] INVASIVE CV LAB;  Service: Cardiovascular;  Laterality: N/A;   shoulder surg rt   1995   SINUS SURGERY WITH INSTATRAK     ethmoidectomy and nasal septum repair   TEE WITHOUT CARDIOVERSION N/A 09/26/2021   Procedure: TRANSESOPHAGEAL ECHOCARDIOGRAM (TEE);  Surgeon:  Raford Riggs, MD;  Location: [facility] ENDOSCOPY;  Service: Cardiovascular;  Laterality: N/A;   TEE WITHOUT CARDIOVERSION N/A 02/03/2022   Procedure: TRANSESOPHAGEAL ECHOCARDIOGRAM (TEE);  Surgeon: Delford Maude BROCKS, MD;  Location: [facility] ENDOSCOPY;  Service: Cardiovascular;  Laterality: N/A;   TEE WITHOUT CARDIOVERSION N/A 08/07/2022   Procedure: TRANSESOPHAGEAL ECHOCARDIOGRAM (TEE);  Surgeon: Rolan Ezra RAMAN, MD;  Location: [facility] ENDOSCOPY;  Service: Cardiovascular;  Laterality: N/A;     Family History  Problem Relation Age of Onset   Heart failure Mother 45   Heart attack Father 63     Social History   Socioeconomic History   Marital status: Widowed    Spouse name: Not  on file   Number of children: Not on file   Years of education: Not on file   Highest education level: Not on file  Occupational History   Not on file  Tobacco Use   Smoking status: Former    Current packs/day: 0.00    Types: Cigarettes    Quit date: 05/04/1986    Years since quitting: 37.6   Smokeless tobacco: Never   Tobacco comments:    Former smoker 02/12/22  Vaping Use   Vaping status: Never Used  Substance and Sexual Activity   Alcohol use: Not Currently   Drug use: No   Sexual activity: Yes  Other Topics Concern   Not on file  Social History Narrative   Not on file   Social Drivers of Health   Financial Resource Strain: Not on file  Food Insecurity: No Food Insecurity (02/01/2022)   Hunger Vital Sign    Worried About Running Out of Food in the Last Year: Never true    Ran Out of Food in the Last Year: Never true  Transportation Needs: No Transportation Needs (02/01/2022)   PRAPARE - Administrator, Civil Service (Medical): No    Lack of Transportation (Non-Medical): No  Physical Activity: Not on file  Stress: Not on file  Social Connections: Not on file  Intimate Partner Violence: Not At Risk (02/01/2022)   Humiliation, Afraid, Rape, and Kick questionnaire    Fear of Current or  Ex-Partner: No    Emotionally Abused: No    Physically Abused: No    Sexually Abused: No     BP 112/64   Ht 5' 8 (1.727 m)   Wt 225 lb (102.1 kg)   BMI 34.21 kg/m   Physical Exam:  Well appearing NAD HEENT: Unremarkable Neck:  No JVD, no thyromegally Lymphatics:  No adenopathy Back:  No CVA tenderness Lungs:  Clear with no wheezes HEART:  Regular rate rhythm, no murmurs, no rubs, no clicks Abd:  soft, positive bowel sounds, no organomegally, no rebound, no guarding Ext:  2 plus pulses, no edema, no cyanosis, no clubbing Skin:  No rashes no nodules Neuro:  CN II through XII intact, motor grossly intact  DEVICE  Normal device function.  See PaceArt for details.   Assess/Plan:  Atria lfib with a RVR - his symptoms are well controlled after AV node ablation. Chronic systolic heart failure - his symptoms are class 2, 3 when he is out of rhythm and going fast. Coags - he will continue coumadin .   Danelle Waddell COME

## 2023-12-01 LAB — CUP PACEART INCLINIC DEVICE CHECK
Battery Remaining Longevity: 57 mo
Battery Voltage: 2.99 V
Brady Statistic RA Percent Paced: 0 %
Brady Statistic RV Percent Paced: 96 %
Date Time Interrogation Session: 20250729162600
Implantable Lead Connection Status: 753985
Implantable Lead Connection Status: 753985
Implantable Lead Connection Status: 753985
Implantable Lead Implant Date: 20210915
Implantable Lead Implant Date: 20210915
Implantable Lead Implant Date: 20210927
Implantable Lead Location: 753858
Implantable Lead Location: 753859
Implantable Lead Location: 753860
Implantable Lead Model: 3830
Implantable Pulse Generator Implant Date: 20210915
Lead Channel Impedance Value: 462.5 Ohm
Lead Channel Impedance Value: 525 Ohm
Lead Channel Impedance Value: 537.5 Ohm
Lead Channel Pacing Threshold Amplitude: 0.5 V
Lead Channel Pacing Threshold Amplitude: 0.5 V
Lead Channel Pacing Threshold Amplitude: 0.5 V
Lead Channel Pacing Threshold Amplitude: 1 V
Lead Channel Pacing Threshold Amplitude: 1 V
Lead Channel Pacing Threshold Amplitude: 1 V
Lead Channel Pacing Threshold Pulse Width: 0.4 ms
Lead Channel Pacing Threshold Pulse Width: 0.4 ms
Lead Channel Pacing Threshold Pulse Width: 0.4 ms
Lead Channel Pacing Threshold Pulse Width: 0.5 ms
Lead Channel Pacing Threshold Pulse Width: 0.5 ms
Lead Channel Pacing Threshold Pulse Width: 0.5 ms
Lead Channel Sensing Intrinsic Amplitude: 5 mV
Lead Channel Setting Pacing Amplitude: 0.25 V
Lead Channel Setting Pacing Amplitude: 2.5 V
Lead Channel Setting Pacing Pulse Width: 0.05 ms
Lead Channel Setting Pacing Pulse Width: 0.5 ms
Lead Channel Setting Sensing Sensitivity: 2 mV
Pulse Gen Model: 3222
Pulse Gen Serial Number: 9188373

## 2024-01-06 NOTE — Progress Notes (Signed)
 Remote pacemaker transmission.

## 2024-01-06 NOTE — Addendum Note (Signed)
 Addended by: VICCI SELLER A on: 01/06/2024 12:55 PM   Modules accepted: Orders

## 2024-01-28 ENCOUNTER — Ambulatory Visit

## 2024-01-28 ENCOUNTER — Encounter

## 2024-01-28 DIAGNOSIS — I447 Left bundle-branch block, unspecified: Secondary | ICD-10-CM | POA: Diagnosis not present

## 2024-01-28 LAB — CUP PACEART REMOTE DEVICE CHECK
Battery Remaining Longevity: 61 mo
Battery Remaining Percentage: 51 %
Battery Voltage: 2.99 V
Date Time Interrogation Session: 20250926020019
Implantable Lead Connection Status: 753985
Implantable Lead Connection Status: 753985
Implantable Lead Connection Status: 753985
Implantable Lead Implant Date: 20210915
Implantable Lead Implant Date: 20210915
Implantable Lead Implant Date: 20210927
Implantable Lead Location: 753858
Implantable Lead Location: 753859
Implantable Lead Location: 753860
Implantable Lead Model: 3830
Implantable Pulse Generator Implant Date: 20210915
Lead Channel Impedance Value: 490 Ohm
Lead Channel Impedance Value: 530 Ohm
Lead Channel Pacing Threshold Amplitude: 0.5 V
Lead Channel Pacing Threshold Amplitude: 1 V
Lead Channel Pacing Threshold Pulse Width: 0.4 ms
Lead Channel Pacing Threshold Pulse Width: 0.5 ms
Lead Channel Sensing Intrinsic Amplitude: 12 mV
Lead Channel Setting Pacing Amplitude: 0.25 V
Lead Channel Setting Pacing Amplitude: 2.5 V
Lead Channel Setting Pacing Pulse Width: 0.05 ms
Lead Channel Setting Pacing Pulse Width: 0.5 ms
Lead Channel Setting Sensing Sensitivity: 2 mV
Pulse Gen Model: 3222
Pulse Gen Serial Number: 9188373

## 2024-01-31 NOTE — Progress Notes (Signed)
 Remote PPM Transmission

## 2024-02-04 ENCOUNTER — Ambulatory Visit: Payer: Self-pay | Admitting: Student in an Organized Health Care Education/Training Program

## 2024-02-26 ENCOUNTER — Ambulatory Visit: Payer: Self-pay | Admitting: Cardiology

## 2024-04-28 ENCOUNTER — Encounter

## 2024-04-28 ENCOUNTER — Ambulatory Visit

## 2024-04-28 DIAGNOSIS — I447 Left bundle-branch block, unspecified: Secondary | ICD-10-CM

## 2024-05-01 LAB — CUP PACEART REMOTE DEVICE CHECK
Battery Remaining Longevity: 59 mo
Battery Remaining Percentage: 49 %
Battery Voltage: 2.99 V
Date Time Interrogation Session: 20251226150847
Implantable Lead Connection Status: 753985
Implantable Lead Connection Status: 753985
Implantable Lead Connection Status: 753985
Implantable Lead Implant Date: 20210915
Implantable Lead Implant Date: 20210915
Implantable Lead Implant Date: 20210927
Implantable Lead Location: 753858
Implantable Lead Location: 753859
Implantable Lead Location: 753860
Implantable Lead Model: 3830
Implantable Pulse Generator Implant Date: 20210915
Lead Channel Impedance Value: 510 Ohm
Lead Channel Impedance Value: 550 Ohm
Lead Channel Pacing Threshold Amplitude: 0.5 V
Lead Channel Pacing Threshold Amplitude: 1 V
Lead Channel Pacing Threshold Pulse Width: 0.4 ms
Lead Channel Pacing Threshold Pulse Width: 0.5 ms
Lead Channel Sensing Intrinsic Amplitude: 11.8 mV
Lead Channel Setting Pacing Amplitude: 0.25 V
Lead Channel Setting Pacing Amplitude: 2.5 V
Lead Channel Setting Pacing Pulse Width: 0.05 ms
Lead Channel Setting Pacing Pulse Width: 0.5 ms
Lead Channel Setting Sensing Sensitivity: 2 mV
Pulse Gen Model: 3222
Pulse Gen Serial Number: 9188373

## 2024-05-03 ENCOUNTER — Ambulatory Visit: Payer: Self-pay | Admitting: Student in an Organized Health Care Education/Training Program

## 2024-05-03 NOTE — Progress Notes (Signed)
 Remote PPM Transmission

## 2024-05-22 ENCOUNTER — Telehealth: Payer: Self-pay | Admitting: Cardiology

## 2024-05-22 NOTE — Telephone Encounter (Signed)
" °*  STAT* If patient is at the pharmacy, call can be transferred to refill team.   1. Which medications need to be refilled? (please list name of each medication and dose if known)   carvedilol  (COREG ) 6.25 MG tablet   sacubitril -valsartan  (ENTRESTO ) 24-26 MG   spironolactone  (ALDACTONE ) 25 MG tablet   2. Which pharmacy/location (including street and city if local pharmacy) is medication to be sent to?  [facility] - [place], [place] - [address]    3. Do they need a 30 day or 90 day supply? 90   Patient's needs 2 weeks work sent to local pharmacy:  [id number] - [place], [place] - [address]   "

## 2024-05-24 ENCOUNTER — Other Ambulatory Visit: Payer: Self-pay | Admitting: Physician Assistant

## 2024-05-24 MED ORDER — SPIRONOLACTONE 25 MG PO TABS: 12.5000 mg | ORAL_TABLET | Freq: Every day | ORAL | 1 refills | Status: DC

## 2024-05-24 MED ORDER — CARVEDILOL 6.25 MG PO TABS: 6.2500 mg | ORAL_TABLET | Freq: Two times a day (BID) | ORAL | 0 refills | Status: AC

## 2024-05-24 MED ORDER — SACUBITRIL-VALSARTAN 24-26 MG PO TABS: 1.0000 | ORAL_TABLET | Freq: Two times a day (BID) | ORAL | 1 refills | Status: DC

## 2024-05-24 MED ORDER — SPIRONOLACTONE 25 MG PO TABS: 12.5000 mg | ORAL_TABLET | Freq: Every day | ORAL | 0 refills | Status: AC

## 2024-05-24 MED ORDER — CARVEDILOL 6.25 MG PO TABS: 6.2500 mg | ORAL_TABLET | Freq: Two times a day (BID) | ORAL | 1 refills | Status: DC

## 2024-05-24 MED ORDER — SACUBITRIL-VALSARTAN 24-26 MG PO TABS: 1.0000 | ORAL_TABLET | Freq: Two times a day (BID) | ORAL | 0 refills | Status: AC

## 2024-07-28 ENCOUNTER — Encounter

## 2024-10-27 ENCOUNTER — Encounter

## 2025-01-26 ENCOUNTER — Encounter

## 2025-04-28 ENCOUNTER — Encounter

## 2025-04-30 ENCOUNTER — Encounter

## 2025-07-27 ENCOUNTER — Encounter

## 2025-07-28 ENCOUNTER — Encounter

## 2025-10-27 ENCOUNTER — Encounter
